# Patient Record
Sex: Female | Born: 1935
Health system: Southern US, Community
[De-identification: ages and names within clinical notes are randomized; demographics above are authoritative.]

## PROBLEM LIST (undated history)

## (undated) DIAGNOSIS — I6529 Occlusion and stenosis of unspecified carotid artery: Secondary | ICD-10-CM

## (undated) DIAGNOSIS — W19XXXA Unspecified fall, initial encounter: Secondary | ICD-10-CM

## (undated) DIAGNOSIS — F419 Anxiety disorder, unspecified: Secondary | ICD-10-CM

## (undated) DIAGNOSIS — M150 Primary generalized (osteo)arthritis: Secondary | ICD-10-CM

## (undated) DIAGNOSIS — E871 Hypo-osmolality and hyponatremia: Secondary | ICD-10-CM

## (undated) DIAGNOSIS — I10 Essential (primary) hypertension: Secondary | ICD-10-CM

## (undated) DIAGNOSIS — K219 Gastro-esophageal reflux disease without esophagitis: Secondary | ICD-10-CM

## (undated) DIAGNOSIS — R32 Unspecified urinary incontinence: Secondary | ICD-10-CM

## (undated) DIAGNOSIS — Z515 Encounter for palliative care: Secondary | ICD-10-CM

## (undated) DIAGNOSIS — N183 Chronic kidney disease, stage 3 unspecified: Secondary | ICD-10-CM

## (undated) DIAGNOSIS — M1990 Unspecified osteoarthritis, unspecified site: Secondary | ICD-10-CM

## (undated) DIAGNOSIS — Z8489 Family history of other specified conditions: Secondary | ICD-10-CM

## (undated) DIAGNOSIS — F0391 Unspecified dementia with behavioral disturbance: Secondary | ICD-10-CM

## (undated) DIAGNOSIS — Y92009 Unspecified place in unspecified non-institutional (private) residence as the place of occurrence of the external cause: Secondary | ICD-10-CM

## (undated) DIAGNOSIS — R413 Other amnesia: Principal | ICD-10-CM

## (undated) DIAGNOSIS — I779 Disorder of arteries and arterioles, unspecified: Secondary | ICD-10-CM

## (undated) DIAGNOSIS — K5790 Diverticulosis of intestine, part unspecified, without perforation or abscess without bleeding: Secondary | ICD-10-CM

## (undated) DIAGNOSIS — R0600 Dyspnea, unspecified: Secondary | ICD-10-CM

## (undated) DIAGNOSIS — I5032 Chronic diastolic (congestive) heart failure: Secondary | ICD-10-CM

## (undated) DIAGNOSIS — E559 Vitamin D deficiency, unspecified: Secondary | ICD-10-CM

## (undated) DIAGNOSIS — J449 Chronic obstructive pulmonary disease, unspecified: Secondary | ICD-10-CM

## (undated) DIAGNOSIS — I509 Heart failure, unspecified: Secondary | ICD-10-CM

## (undated) DIAGNOSIS — K648 Other hemorrhoids: Secondary | ICD-10-CM

## (undated) DIAGNOSIS — I959 Hypotension, unspecified: Secondary | ICD-10-CM

## (undated) DIAGNOSIS — E785 Hyperlipidemia, unspecified: Secondary | ICD-10-CM

## (undated) DIAGNOSIS — R112 Nausea with vomiting, unspecified: Secondary | ICD-10-CM

## (undated) DIAGNOSIS — I739 Peripheral vascular disease, unspecified: Secondary | ICD-10-CM

## (undated) DIAGNOSIS — R05 Cough: Secondary | ICD-10-CM

## (undated) HISTORY — DX: Other hemorrhoids: K64.8

## (undated) HISTORY — PX: CATARACT EXTRACTION: SUR2

## (undated) HISTORY — DX: Vitamin D deficiency, unspecified: E55.9

## (undated) HISTORY — DX: Other amnesia: R41.3

## (undated) HISTORY — PX: TONSILLECTOMY: SUR1361

## (undated) HISTORY — DX: Occlusion and stenosis of unspecified carotid artery: I65.29

## (undated) HISTORY — DX: Encounter for palliative care: Z51.5

## (undated) HISTORY — DX: Unspecified osteoarthritis, unspecified site: M19.90

## (undated) HISTORY — DX: Diverticulosis of intestine, part unspecified, without perforation or abscess without bleeding: K57.90

## (undated) HISTORY — PX: CAROTID ENDARTERECTOMY: SUR193

## (undated) HISTORY — DX: Cough: R05

## (undated) HISTORY — PX: BREAST REDUCTION SURGERY: SHX8

## (undated) HISTORY — DX: Hypo-osmolality and hyponatremia: E87.1

## (undated) HISTORY — DX: Heart failure, unspecified: I50.9

## (undated) HISTORY — DX: Chronic diastolic (congestive) heart failure: I50.32

## (undated) HISTORY — DX: Essential (primary) hypertension: I10

## (undated) HISTORY — DX: Peripheral vascular disease, unspecified: I73.9

## (undated) HISTORY — PX: APPENDECTOMY: SHX54

## (undated) HISTORY — PX: OTHER SURGICAL HISTORY: SHX169

## (undated) HISTORY — PX: EYE SURGERY: SHX253

## (undated) HISTORY — DX: Unspecified place in unspecified non-institutional (private) residence as the place of occurrence of the external cause: Y92.009

## (undated) HISTORY — DX: Disorder of arteries and arterioles, unspecified: I77.9

## (undated) HISTORY — DX: Unspecified dementia with behavioral disturbance: F03.91

## (undated) HISTORY — DX: Nausea with vomiting, unspecified: R11.2

## (undated) HISTORY — DX: Chronic kidney disease, stage 3 (moderate): N18.3

## (undated) HISTORY — DX: Hyperlipidemia, unspecified: E78.5

## (undated) HISTORY — DX: Unspecified fall, initial encounter: W19.XXXA

## (undated) HISTORY — DX: Gastro-esophageal reflux disease without esophagitis: K21.9

## (undated) HISTORY — DX: Primary generalized (osteo)arthritis: M15.0

## (undated) HISTORY — DX: Dyspnea, unspecified: R06.00

## (undated) HISTORY — DX: Chronic kidney disease, stage 3 unspecified: N18.30

## (undated) HISTORY — DX: Hypotension, unspecified: I95.9

## (undated) HISTORY — DX: Chronic obstructive pulmonary disease, unspecified: J44.9

## (undated) HISTORY — DX: Anxiety disorder, unspecified: F41.9

---

## 2000-09-07 ENCOUNTER — Encounter (HOSPITAL_BASED_OUTPATIENT_CLINIC_OR_DEPARTMENT_OTHER): Payer: Self-pay | Admitting: Internal Medicine

## 2000-09-07 ENCOUNTER — Encounter: Admission: RE | Admit: 2000-09-07 | Discharge: 2000-09-07 | Payer: Self-pay | Admitting: Internal Medicine

## 2002-01-21 ENCOUNTER — Encounter: Admission: RE | Admit: 2002-01-21 | Discharge: 2002-01-21 | Payer: Self-pay | Admitting: Internal Medicine

## 2002-01-21 ENCOUNTER — Encounter: Payer: Self-pay | Admitting: Internal Medicine

## 2002-10-11 ENCOUNTER — Ambulatory Visit (HOSPITAL_COMMUNITY): Admission: RE | Admit: 2002-10-11 | Discharge: 2002-10-11 | Payer: Self-pay | Admitting: Gastroenterology

## 2002-10-11 ENCOUNTER — Encounter (INDEPENDENT_AMBULATORY_CARE_PROVIDER_SITE_OTHER): Payer: Self-pay | Admitting: Specialist

## 2003-02-20 ENCOUNTER — Encounter: Admission: RE | Admit: 2003-02-20 | Discharge: 2003-02-20 | Payer: Self-pay | Admitting: Internal Medicine

## 2003-02-20 ENCOUNTER — Encounter: Payer: Self-pay | Admitting: Internal Medicine

## 2004-03-21 ENCOUNTER — Ambulatory Visit (HOSPITAL_COMMUNITY): Admission: RE | Admit: 2004-03-21 | Discharge: 2004-03-21 | Payer: Self-pay | Admitting: Internal Medicine

## 2007-04-09 ENCOUNTER — Encounter: Admission: RE | Admit: 2007-04-09 | Discharge: 2007-04-09 | Payer: Self-pay | Admitting: Internal Medicine

## 2007-05-18 ENCOUNTER — Ambulatory Visit: Payer: Self-pay | Admitting: Vascular Surgery

## 2008-01-20 ENCOUNTER — Ambulatory Visit (HOSPITAL_COMMUNITY): Admission: RE | Admit: 2008-01-20 | Discharge: 2008-01-20 | Payer: Self-pay | Admitting: Internal Medicine

## 2008-12-06 ENCOUNTER — Other Ambulatory Visit: Admission: RE | Admit: 2008-12-06 | Discharge: 2008-12-06 | Payer: Self-pay | Admitting: Obstetrics & Gynecology

## 2009-11-06 ENCOUNTER — Ambulatory Visit: Payer: Self-pay | Admitting: Vascular Surgery

## 2009-11-09 ENCOUNTER — Ambulatory Visit: Payer: Self-pay | Admitting: Vascular Surgery

## 2009-11-09 ENCOUNTER — Encounter: Payer: Self-pay | Admitting: Vascular Surgery

## 2009-11-09 ENCOUNTER — Inpatient Hospital Stay (HOSPITAL_COMMUNITY): Admission: RE | Admit: 2009-11-09 | Discharge: 2009-11-11 | Payer: Self-pay | Admitting: Vascular Surgery

## 2009-12-04 ENCOUNTER — Ambulatory Visit: Payer: Self-pay | Admitting: Vascular Surgery

## 2010-04-16 ENCOUNTER — Ambulatory Visit: Payer: Self-pay | Admitting: Vascular Surgery

## 2010-10-22 ENCOUNTER — Ambulatory Visit: Payer: Self-pay | Admitting: Vascular Surgery

## 2011-03-04 LAB — COMPREHENSIVE METABOLIC PANEL
BUN: 12 mg/dL (ref 6–23)
CO2: 28 mEq/L (ref 19–32)
Calcium: 9.1 mg/dL (ref 8.4–10.5)
Creatinine, Ser: 0.9 mg/dL (ref 0.4–1.2)
GFR calc non Af Amer: >60 mL/min (ref >60)
Glucose, Bld: 105 mg/dL — ABNORMAL HIGH (ref 70–99)
Sodium: 139 mEq/L (ref 135–145)
Total Protein: 7.1 g/dL (ref 6.0–8.3)

## 2011-03-04 LAB — CBC
Hemoglobin: 11.2 g/dL — ABNORMAL LOW (ref 12.0–15.0)
Hemoglobin: 14.9 g/dL (ref 12.0–15.0)
MCHC: 34.1 g/dL (ref 30.0–36.0)
MCHC: 34.2 g/dL (ref 30.0–36.0)
MCV: 87.5 fL (ref 78.0–100.0)
MCV: 89.1 fL (ref 78.0–100.0)
RBC: 3.68 MIL/uL — ABNORMAL LOW (ref 3.87–5.11)
RBC: 4.98 MIL/uL (ref 3.87–5.11)
RDW: 13.6 % (ref 11.5–15.5)
RDW: 13.6 % (ref 11.5–15.5)

## 2011-03-04 LAB — BASIC METABOLIC PANEL
CO2: 32 mEq/L (ref 19–32)
Chloride: 94 mEq/L — ABNORMAL LOW (ref 96–112)
Creatinine, Ser: 0.88 mg/dL (ref 0.4–1.2)
GFR calc Af Amer: >60 mL/min (ref >60)
Glucose, Bld: 130 mg/dL — ABNORMAL HIGH (ref 70–99)
Sodium: 130 mEq/L — ABNORMAL LOW (ref 135–145)

## 2011-03-04 LAB — URINALYSIS, ROUTINE W REFLEX MICROSCOPIC
Bilirubin Urine: NEGATIVE
Glucose, UA: NEGATIVE mg/dL
Hgb urine dipstick: NEGATIVE
Protein, ur: NEGATIVE mg/dL

## 2011-03-04 LAB — CROSSMATCH
ABO/RH(D): O POS
Antibody Screen: NEGATIVE

## 2011-03-04 LAB — APTT: aPTT: 29 seconds (ref 24–37)

## 2011-03-04 LAB — PROTIME-INR
INR: 1.04 (ref 0.00–1.49)
Prothrombin Time: 13.5 seconds (ref 11.6–15.2)

## 2011-04-15 NOTE — Procedures (Signed)
CAROTID DUPLEX EXAM   INDICATION:  Followup known carotid disease.   HISTORY:  Diabetes:  No.  Cardiac:  No.  Hypertension:  Yes.  Smoking:  Previous.  Previous Surgery:  On 11/09/2009 left carotid endarterectomy.  CV History:  No.  Amaurosis Fugax No, Paresthesias No, Hemiparesis No                                       RIGHT             LEFT  Brachial systolic pressure:         156               152  Brachial Doppler waveforms:         Normal            Normal  Vertebral direction of flow:        Antegrade         Bidirectional  DUPLEX VELOCITIES (cm/sec)  CCA peak systolic                   68                62  ECA peak systolic                   69                49  ICA peak systolic                   116               96  ICA end diastolic                   34                31  PLAQUE MORPHOLOGY:                  Calcific  PLAQUE AMOUNT:                      Mild  PLAQUE LOCATION:                    ICA / bulb.   IMPRESSION:  1. The right internal carotid artery suggests low end 40%-59%      stenosis.  2. Left internal carotid artery appears patent with no evidence of      restenosis.  3. Left vertebral artery suggests low-grade bidirectional flow.  4. Right vertebral artery suggests antegrade flow.   ___________________________________________  Quita Skye Hart [place], M.D.   NT/MEDQ  D:  10/22/2010  T:  10/22/2010  Job:  [id number]

## 2011-04-15 NOTE — Procedures (Signed)
CAROTID DUPLEX EXAM   INDICATION:  Carotid left bruit.   HISTORY:  Diabetes:  No.  Cardiac:  No.  Hypertension:  No.  Smoking:  Previous.  Previous Surgery:  No.  CV History:  Asymptomatic.  Amaurosis Fugax No, Paresthesias No, Hemiparesis No.                                       RIGHT             LEFT  Brachial systolic pressure:         100               115  Brachial Doppler waveforms:         Triphasic         Triphasic  Vertebral direction of flow:        Antegrade         To and fro  DUPLEX VELOCITIES (cm/sec)  CCA peak systolic                   50                50  ECA peak systolic                   108               119  ICA peak systolic                   182               407  ICA end diastolic                   47                189  PLAQUE MORPHOLOGY:                  Heterogenous      Heterogenous  PLAQUE AMOUNT:                      Moderate          Critical  PLAQUE LOCATION:                    ICA               ICA, bifurcation   IMPRESSION:  1. 40-59% stenosis noted on the right internal carotid artery.  2. 80-99% stenosis noted in the left internal carotid artery.  3. Left vertebral has a to and fro waveform, suggestive of possible      steal syndrome.   ___________________________________________  Quita Skye Hart [place], M.D.   CJ/MEDQ  D:  11/06/2009  T:  11/06/2009  Job:  [id number]

## 2011-04-15 NOTE — Consult Note (Signed)
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 This patient was noted to have  a differential in the blood pressure of her upper extremities by Dr.  Felipa Eth  with the right being much higher than the left  and she was  scheduled for CT angiography. I reviewed that and this reveals that she  does have some significant left subclavian occlusive disease at the  origin near the arch and also about 3 to 4 cm distally near the origin  of the vertebral artery on the left. She also has plaque formation at  the origin of her innominate artery on the right. She denies any  ischemic symptoms in either upper extremity including numbness,  paresthesias, fatigue, discoloration or throbbing of the arm. She also  denies any posterior circulation symptoms such as dizziness, diplopia,  blurred vision, syncope and has had no hemispheric or nonhemispheric  TIAs or history of stroke.   PAST MEDICAL HISTORY:  Positive for hyperlipidemia. Negative for  diabetes, hypertension, coronary artery disease, COPD.   PREVIOUS SURGERY:  Includes appendectomy for ruptured appendix 15 to 20  years ago.   FAMILY HISTORY:  Positive for diabetes in grandfather's family, positive  for coronary artery disease in the grandmother's family. Negative for  stroke.   SOCIAL HISTORY:  The patient is single, has 2 children and is retired.  She has smoked on and off for the past 50 years, roughly half a pack of  cigarettes per day, but has not smoked in 2 years.   MEDICATIONS:  Please see health history form.   ALLERGIES:  None known.   PHYSICAL EXAMINATION:  VITAL SIGNS: Blood pressure is 174/93 on the  right, 147/92 on the left and at  the end of the appointment ws rechecked  on the right and was 170/90. Heart rate is 81, respirations 18.  GENERAL: She is a female patient who is in no apparent distress. Alert  and oriented x3.  NECK: Her neck is supple, 3+ carotid pulses palpable. There is a soft  bruit in the left supra- and infraclavicular areas.  NEUROLOGIC: Exam is normal. There is no palpable adenopathy in the neck.  EXTREMITIES: The upper extremity pulses are 3+ bilaterally. Both hands  are well perfused.  CHEST: Clear to auscultation.  CARDIOVASCULAR: Exam reveals a regular rhythm with no murmurs.  ABDOMEN: Her abdomen is soft, nontender with no masses palpable. She has  3+ femoral, popliteal and posterior tibial pulses bilaterally.   I discussed the situation with her and her daughter at length and  explained to them that treatment is necessary since she is having no  ischemic symptoms in the arm and is having no symptoms consistent with  subclavian steal syndrome such as posterior circulation, ischemia.  Denies dizziness, diplopia, blurred vision or syncope. She is totally  asymptomatic. I would monitor her blood pressure in the right upper  extremity which I think is fairly accurate since the innominate stenosis  is very mild. Her blood pressure is elevated today and I have asked her  to  get in touch with Dr. Felipa Eth to begin antihypertensive treatments and I  will be happy to see her in the future as necessary.   Quita Skye Hart [place], M.D.  Electronically Signed  JDL/MEDQ  D:  05/18/2007  T:  05/19/2007  Job:  [id number]   cc:   Larina Earthly, M.D.

## 2011-04-15 NOTE — Assessment & Plan Note (Signed)
OFFICE VISIT   Virginia [name], Virginia [name] A  DOB:  22-Jul-1936                                       04/16/2010  [id number]#:[id number]   The patient returns for initial 59-month evaluation following a left  carotid endarterectomy which I performed December 2010 for an  asymptomatic severe left internal carotid stenosis.  She has known mild  to moderate right internal carotid occlusive disease.  She denies any  symptoms of hemiparesis, aphasia, amaurosis fugax, diplopia, blurred  vision, syncope or other neurologic symptoms.  She has had some  discomfort on the left side of her neck when she sleeps with the left  side down and that has been fairly constant, has not become more severe  and has been relieved by sleeping in the other positions.  The medicine  she takes for her shoulder arthritis seems to have helped that, she  states.  She has noticed no swelling in the left neck and has had no  drainage or infection.   CHRONIC MEDICAL PROBLEMS:  1. Hypertension.  2. Hyperlipidemia.  3. Negative coronary artery disease, diabetes, COPD or stroke.   SOCIAL HISTORY:  She is retired, single, has 2 children.  Does not use  tobacco or alcohol.   REVIEW OF SYSTEMS:  Negative chest pain, dyspnea on exertion, chronic  bronchitis, asthma, wheezing.  No GI or GU symptoms.  Has diffuse  arthritis and joint pain, particularly in the shoulder and the neck.  All other systems in the review of systems are negative.   PHYSICAL EXAMINATION:  Blood pressure 116/70, heart rate is 89,  temperature 98.  Generally, she is a well-developed, well-nourished  female who is in no apparent distress.  She is alert and oriented x3.  HEENT exam is normal.  EOMs intact.  Lungs are clear to auscultation.  No rhonchi or wheezing.  Cardiovascular exam:  Regular rhythm, no  murmurs.  Carotid pulses are 3 plus with no audible bruits.  Left neck  incision has healed nicely.  Abdomen is soft, nontender, with no  masses.  Neurologic exam is normal.  Musculoskeletal exam is free of major  deformities.   Today I ordered a carotid duplex exam, which I reviewed and interpreted.  The left carotid endarterectomy site is widely patent and she has some  mild right internal carotid stenosis in the 40%-50% range.   In general, I think she is doing well.  We will see her in 6 months to  follow her on a carotid protocol following left carotid endarterectomy  unless she develops any symptoms in the interim.  She will continue  taking 1 aspirin daily.     Quita Skye Hart [place], M.D.  Electronically Signed   JDL/MEDQ  D:  04/16/2010  T:  04/17/2010  Job:  [id number]   cc:   Larina Earthly, M.D.

## 2011-04-15 NOTE — H&P (Signed)
HISTORY AND PHYSICAL EXAMINATION   November 06, 2009   Re:  Virginia Virginia [name], Virginia Virginia [name]                 DOB:  Dec 09, 1935   CHIEF COMPLAINT:  Severe left internal carotid stenosis - asymptomatic.   HISTORY OF PRESENT ILLNESS:  This 75 year old female patient was  referred by Dr. Felipa [name] for [place] [name] left carotid bruit and possible carotid  occlusive disease.  The patient is known to have Virginia [name] left subclavian  occlusive disease having been evaluated by me in 2008 and at that time  the subclavian disease was asymptomatic and no treatment or surgery was  recommended.  She has denied any hemispheric or nonhemispheric TIAs,  amaurosis fugax, diplopia, blurred vision, syncope or left arm  claudication since that time and was now referred for an asymptomatic  left carotid bruit.   PAST MEDICAL HISTORY:  Chronic medical problems:  1. Previous hypertension but now does not have hypertension.  2. Hyperlipidemia.  3. Negative for diabetes, coronary artery disease, COPD or stroke.   PAST SURGICAL HISTORY:  1. Tonsillectomy.  2. Ruptured appendix with gangrene and peritonitis.  3. Breast reduction.   FAMILY HISTORY:  Positive for coronary artery disease in her father who  died of [place] [name] myocardial infarction age 64, negative for diabetes and  stroke.   SOCIAL HISTORY:  She is single, has 2 children.  Has not used tobacco in  4 years and never smoked more than half pack of cigarettes per day.  Does not use alcohol.   REVIEW OF SYSTEMS:  Has occasional chronic bronchitis, urinary  frequency, arthritis, joint pain, muscle pain, decreased hearing.  All  other systems in the 10 point review of systems were negative.   PHYSICAL EXAM:  Vital signs:  Blood pressure 121/70, heart rate 77,  respirations 14, temperature 97.9.  General: She is Virginia [name] well-developed,  well-nourished female in no apparent distress.  She is alert and  oriented x3.  HEENT:  Is unremarkable.  EOMs intact.  Neck:  Supple, 3+  carotid  pulses, high-pitched bruit over the left carotid bifurcation.  Neurologic:  Exam is normal.  Lungs:  Clear to auscultation.  No  wheezing.  Cardiovascular:  Regular rhythm.  No murmurs.  Abdomen:  Soft, nontender with no masses.  Musculoskeletal:  Exam reveals no major  deformities.  Skin:  Is free of rashes.   I ordered [place] [name] carotid duplex exam today and visualized and interpreted it.  It revealed that she has [place] [name] 90% left internal carotid stenosis and an  approximate 50% right internal carotid stenosis.  She also has left  subclavian occlusive disease with some sluggish flow in the left  vertebral artery.   ALLERGIES:  None known.   IMPRESSION:  Left carotid stenosis - asymptomatic.   PLAN:  To admit the patient on December 10 for an elective left carotid  endarterectomy.  The risks and benefits have been thoroughly discussed  with the patient.  She would like to proceed.   Virginia Virginia [name], M.D.  Electronically Signed   JDL/MEDQ  D:  11/06/2009  T:  11/07/2009  Job:  [id number]   cc:   Virginia Virginia [name], M.D.

## 2011-04-15 NOTE — Assessment & Plan Note (Signed)
OFFICE VISIT   NEOSHA, SWITALSKI A  DOB:  12-23-1935                                       12/04/2009  [id number]#:[id number]   The patient returns for initial followup regarding a left carotid  endarterectomy which I performed on December 12 for severe left internal  carotid stenosis which is asymptomatic.  She has done very well since  her surgery with no neurologic complications or specific complaints.  She has denied any dysphagia, hoarseness, hemiparesis, aphasia,  amaurosis fugax, diplopia, blurred vision or syncope.  She was beginning  to resume her normal activities.  She is taking 1 aspirin per day.   PHYSICAL EXAMINATION:  Blood pressure 140/78, heart rate 103,  respirations 14, temperature 97.8.  Left neck incision has healed  nicely.  Carotid pulses 3+ with no audible bruits.  Neurologic exam is  normal.   The patient apparently had an episode of pneumonia treated by Dr. Felipa Eth  with Levaquin which has resolved.   In general, I think she does have very good and I have encouraged her to  resume normal activity level.  We will plan to see her in 6 months with  follow-up carotid duplex exam unless she develops any neurologic  symptoms in the interim.     Quita Skye Hart [place], M.D.  Electronically Signed   JDL/MEDQ  D:  12/04/2009  T:  12/05/2009  Job:  [id number]   cc:   Larina Earthly, M.D.

## 2011-04-15 NOTE — Procedures (Signed)
CAROTID DUPLEX EXAM   INDICATION:  Follow up known carotid artery disease.   HISTORY:  Diabetes:  No.  Cardiac:  No.  Hypertension:  Yes.  Smoking:  Previous.  Previous Surgery:  11/09/09, left carotid endarterectomy.  CV History:  No.  Amaurosis Fugax No, Paresthesias No, Hemiparesis No.                                       RIGHT             LEFT  Brachial systolic pressure:         112               118  Brachial Doppler waveforms:         WNL               WNL  Vertebral direction of flow:        Antegrade         Not visualized  DUPLEX VELOCITIES (cm/sec)  CCA peak systolic                   98                89  ECA peak systolic                   116               104  ICA peak systolic                   135               146  ICA end diastolic                   36                43  PLAQUE MORPHOLOGY:                  Calcific  PLAQUE AMOUNT:                      Mild  PLAQUE LOCATION:                    ICA/bulb   IMPRESSION:  1. Right internal carotid artery suggests 40% to 59% stenosis.  2. Left internal carotid artery appears patent, status post carotid      endarterectomy with no evidence of restenosis.  3. Left vertebral not visualized.   ___________________________________________  Quita Skye Hart [place], M.D.   CB/MEDQ  D:  04/16/2010  T:  04/17/2010  Job:  [id number]

## 2011-10-16 ENCOUNTER — Other Ambulatory Visit (INDEPENDENT_AMBULATORY_CARE_PROVIDER_SITE_OTHER): Payer: Medicare Other | Admitting: *Deleted

## 2011-10-16 DIAGNOSIS — I6529 Occlusion and stenosis of unspecified carotid artery: Secondary | ICD-10-CM

## 2011-10-16 DIAGNOSIS — Z48812 Encounter for surgical aftercare following surgery on the circulatory system: Secondary | ICD-10-CM

## 2011-10-29 ENCOUNTER — Encounter: Payer: Self-pay | Admitting: Vascular Surgery

## 2011-10-29 NOTE — Procedures (Unsigned)
CAROTID DUPLEX EXAM  INDICATION:  Carotid endarterectomy.  HISTORY: Diabetes:  No. Cardiac:  No. Hypertension:  Yes. Smoking:  Previous. Previous Surgery:  Left carotid endarterectomy on 11/09/2009. CV History:  Currently asymptomatic. Amaurosis Fugax No, Paresthesias No, Hemiparesis No.                                      RIGHT             LEFT Brachial systolic pressure:         152               144 Brachial Doppler waveforms:         Normal            Normal Vertebral direction of flow:        Antegrade         Not visualized DUPLEX VELOCITIES (cm/sec) CCA peak systolic                   63                53 ECA peak systolic                   79                66 ICA peak systolic                   98                66 ICA end diastolic                   27                20 PLAQUE MORPHOLOGY:                  Heterogenous PLAQUE AMOUNT:                      Mild              None PLAQUE LOCATION:                    ICA/ECA  IMPRESSION: 1. Patent left carotid endarterectomy site with no hemodynamically     significant stenoses of the bilateral internal carotid arteries. 2. No significant change noted when compared with the previous     examination on 10/22/2010.  ___________________________________________ Quita Skye. Hart [place], M.D.  CH/MEDQ  D:  10/16/2011  T:  10/16/2011  Job:  [id number]

## 2011-11-03 ENCOUNTER — Other Ambulatory Visit: Payer: Self-pay

## 2011-11-03 DIAGNOSIS — Z48812 Encounter for surgical aftercare following surgery on the circulatory system: Secondary | ICD-10-CM

## 2011-11-03 DIAGNOSIS — I6529 Occlusion and stenosis of unspecified carotid artery: Secondary | ICD-10-CM

## 2012-03-03 DIAGNOSIS — R05 Cough: Secondary | ICD-10-CM | POA: Diagnosis not present

## 2012-03-03 DIAGNOSIS — I10 Essential (primary) hypertension: Secondary | ICD-10-CM | POA: Diagnosis not present

## 2012-03-03 DIAGNOSIS — J301 Allergic rhinitis due to pollen: Secondary | ICD-10-CM | POA: Diagnosis not present

## 2012-03-03 DIAGNOSIS — R062 Wheezing: Secondary | ICD-10-CM | POA: Diagnosis not present

## 2012-04-15 DIAGNOSIS — I10 Essential (primary) hypertension: Secondary | ICD-10-CM | POA: Diagnosis not present

## 2012-04-15 DIAGNOSIS — I739 Peripheral vascular disease, unspecified: Secondary | ICD-10-CM | POA: Diagnosis not present

## 2012-04-15 DIAGNOSIS — E785 Hyperlipidemia, unspecified: Secondary | ICD-10-CM | POA: Diagnosis not present

## 2012-04-15 DIAGNOSIS — J309 Allergic rhinitis, unspecified: Secondary | ICD-10-CM | POA: Diagnosis not present

## 2012-05-28 DIAGNOSIS — M546 Pain in thoracic spine: Secondary | ICD-10-CM | POA: Diagnosis not present

## 2012-05-28 DIAGNOSIS — T148XXA Other injury of unspecified body region, initial encounter: Secondary | ICD-10-CM | POA: Diagnosis not present

## 2012-05-29 DIAGNOSIS — W108XXA Fall (on) (from) other stairs and steps, initial encounter: Secondary | ICD-10-CM | POA: Diagnosis not present

## 2012-05-29 DIAGNOSIS — M549 Dorsalgia, unspecified: Secondary | ICD-10-CM | POA: Diagnosis not present

## 2012-05-29 DIAGNOSIS — S0003XA Contusion of scalp, initial encounter: Secondary | ICD-10-CM | POA: Diagnosis not present

## 2012-05-29 DIAGNOSIS — S0990XA Unspecified injury of head, initial encounter: Secondary | ICD-10-CM | POA: Diagnosis not present

## 2012-05-29 DIAGNOSIS — M542 Cervicalgia: Secondary | ICD-10-CM | POA: Diagnosis not present

## 2012-05-29 DIAGNOSIS — IMO0002 Reserved for concepts with insufficient information to code with codable children: Secondary | ICD-10-CM | POA: Diagnosis not present

## 2012-05-29 DIAGNOSIS — I6200 Nontraumatic subdural hemorrhage, unspecified: Secondary | ICD-10-CM | POA: Diagnosis not present

## 2012-05-29 DIAGNOSIS — S199XXA Unspecified injury of neck, initial encounter: Secondary | ICD-10-CM | POA: Diagnosis not present

## 2012-05-29 DIAGNOSIS — S1093XA Contusion of unspecified part of neck, initial encounter: Secondary | ICD-10-CM | POA: Diagnosis not present

## 2012-05-29 DIAGNOSIS — R404 Transient alteration of awareness: Secondary | ICD-10-CM | POA: Diagnosis not present

## 2012-05-29 DIAGNOSIS — R930 Abnormal findings on diagnostic imaging of skull and head, not elsewhere classified: Secondary | ICD-10-CM | POA: Diagnosis not present

## 2012-05-29 DIAGNOSIS — R51 Headache: Secondary | ICD-10-CM | POA: Diagnosis not present

## 2012-05-29 DIAGNOSIS — S0190XA Unspecified open wound of unspecified part of head, initial encounter: Secondary | ICD-10-CM | POA: Diagnosis not present

## 2012-05-29 DIAGNOSIS — M25569 Pain in unspecified knee: Secondary | ICD-10-CM | POA: Diagnosis not present

## 2012-05-29 DIAGNOSIS — S060X0A Concussion without loss of consciousness, initial encounter: Secondary | ICD-10-CM | POA: Diagnosis not present

## 2012-05-29 DIAGNOSIS — T1490XA Injury, unspecified, initial encounter: Secondary | ICD-10-CM | POA: Diagnosis not present

## 2012-05-29 DIAGNOSIS — S0100XA Unspecified open wound of scalp, initial encounter: Secondary | ICD-10-CM | POA: Diagnosis not present

## 2012-05-29 DIAGNOSIS — M546 Pain in thoracic spine: Secondary | ICD-10-CM | POA: Diagnosis not present

## 2012-05-30 DIAGNOSIS — R404 Transient alteration of awareness: Secondary | ICD-10-CM | POA: Diagnosis not present

## 2012-05-30 DIAGNOSIS — J984 Other disorders of lung: Secondary | ICD-10-CM | POA: Diagnosis not present

## 2012-05-30 DIAGNOSIS — R930 Abnormal findings on diagnostic imaging of skull and head, not elsewhere classified: Secondary | ICD-10-CM | POA: Diagnosis not present

## 2012-05-30 DIAGNOSIS — S0190XA Unspecified open wound of unspecified part of head, initial encounter: Secondary | ICD-10-CM | POA: Diagnosis not present

## 2012-05-31 DIAGNOSIS — R404 Transient alteration of awareness: Secondary | ICD-10-CM | POA: Diagnosis not present

## 2012-05-31 DIAGNOSIS — R930 Abnormal findings on diagnostic imaging of skull and head, not elsewhere classified: Secondary | ICD-10-CM | POA: Diagnosis not present

## 2012-05-31 DIAGNOSIS — S0190XA Unspecified open wound of unspecified part of head, initial encounter: Secondary | ICD-10-CM | POA: Diagnosis not present

## 2012-06-07 DIAGNOSIS — E785 Hyperlipidemia, unspecified: Secondary | ICD-10-CM | POA: Diagnosis not present

## 2012-06-07 DIAGNOSIS — I10 Essential (primary) hypertension: Secondary | ICD-10-CM | POA: Diagnosis not present

## 2012-06-29 DIAGNOSIS — T07XXXA Unspecified multiple injuries, initial encounter: Secondary | ICD-10-CM | POA: Diagnosis not present

## 2012-06-29 DIAGNOSIS — S0100XA Unspecified open wound of scalp, initial encounter: Secondary | ICD-10-CM | POA: Diagnosis not present

## 2012-06-29 DIAGNOSIS — M1990 Unspecified osteoarthritis, unspecified site: Secondary | ICD-10-CM | POA: Diagnosis not present

## 2012-06-29 DIAGNOSIS — Z4802 Encounter for removal of sutures: Secondary | ICD-10-CM | POA: Diagnosis not present

## 2012-07-16 DIAGNOSIS — S0100XA Unspecified open wound of scalp, initial encounter: Secondary | ICD-10-CM | POA: Diagnosis not present

## 2012-07-16 DIAGNOSIS — I10 Essential (primary) hypertension: Secondary | ICD-10-CM | POA: Diagnosis not present

## 2012-07-16 DIAGNOSIS — R350 Frequency of micturition: Secondary | ICD-10-CM | POA: Diagnosis not present

## 2012-07-26 DIAGNOSIS — D1801 Hemangioma of skin and subcutaneous tissue: Secondary | ICD-10-CM | POA: Diagnosis not present

## 2012-07-26 DIAGNOSIS — Z85828 Personal history of other malignant neoplasm of skin: Secondary | ICD-10-CM | POA: Diagnosis not present

## 2012-08-01 DIAGNOSIS — Y92009 Unspecified place in unspecified non-institutional (private) residence as the place of occurrence of the external cause: Secondary | ICD-10-CM

## 2012-08-01 DIAGNOSIS — W19XXXA Unspecified fall, initial encounter: Secondary | ICD-10-CM

## 2012-08-01 HISTORY — DX: Unspecified fall, initial encounter: W19.XXXA

## 2012-08-01 HISTORY — DX: Unspecified place in unspecified non-institutional (private) residence as the place of occurrence of the external cause: Y92.009

## 2012-10-08 ENCOUNTER — Encounter: Payer: Self-pay | Admitting: Vascular Surgery

## 2012-10-15 DIAGNOSIS — M899 Disorder of bone, unspecified: Secondary | ICD-10-CM | POA: Diagnosis not present

## 2012-10-15 DIAGNOSIS — M949 Disorder of cartilage, unspecified: Secondary | ICD-10-CM | POA: Diagnosis not present

## 2012-10-15 DIAGNOSIS — R82998 Other abnormal findings in urine: Secondary | ICD-10-CM | POA: Diagnosis not present

## 2012-10-15 DIAGNOSIS — E785 Hyperlipidemia, unspecified: Secondary | ICD-10-CM | POA: Diagnosis not present

## 2012-10-15 DIAGNOSIS — I10 Essential (primary) hypertension: Secondary | ICD-10-CM | POA: Diagnosis not present

## 2012-10-18 ENCOUNTER — Encounter: Payer: Self-pay | Admitting: Neurosurgery

## 2012-10-19 ENCOUNTER — Encounter: Payer: Self-pay | Admitting: Vascular Surgery

## 2012-10-19 ENCOUNTER — Ambulatory Visit (INDEPENDENT_AMBULATORY_CARE_PROVIDER_SITE_OTHER): Payer: Medicare Other | Admitting: Neurosurgery

## 2012-10-19 ENCOUNTER — Other Ambulatory Visit (INDEPENDENT_AMBULATORY_CARE_PROVIDER_SITE_OTHER): Payer: Medicare Other | Admitting: *Deleted

## 2012-10-19 ENCOUNTER — Encounter: Payer: Self-pay | Admitting: Neurosurgery

## 2012-10-19 VITALS — BP 147/83 | HR 64 | Resp 18 | Ht 64.5 in | Wt 145.0 lb

## 2012-10-19 DIAGNOSIS — I6529 Occlusion and stenosis of unspecified carotid artery: Secondary | ICD-10-CM

## 2012-10-19 DIAGNOSIS — Z48812 Encounter for surgical aftercare following surgery on the circulatory system: Secondary | ICD-10-CM

## 2012-10-19 NOTE — Progress Notes (Signed)
[facility] Carotid Office Note  CC: Carotid surveillance Referring Physician: Hart Rochester  History of Present Illness: 76 year old female patient of Dr. Hart Rochester status post left CEA in 2010. The patient denies any signs or symptoms of CVA, TIA, amaurosis fugax or any neural deficit. The patient denies any new medical diagnoses or recent surgery.  Past Medical History  Diagnosis Date  . Hyperlipidemia   . Hypertension   . Carotid artery occlusion     ROS: [x]  Positive   [ ]  Denies    General: [ ]  Weight loss, [ ]  Fever, [ ]  chills Neurologic: [ ]  Dizziness, [ ]  Blackouts, [ ]  Seizure [ ]  Stroke, [ ]  "Mini stroke", [ ]  Slurred speech, [ ]  Temporary blindness; [ ]  weakness in arms or legs, [ ]  Hoarseness Cardiac: [ ]  Chest pain/pressure, [ ]  Shortness of breath at rest [ ]  Shortness of breath with exertion, [ ]  Atrial fibrillation or irregular heartbeat Vascular: [ ]  Pain in legs with walking, [ ]  Pain in legs at rest, [ ]  Pain in legs at night,  [ ]  Non-healing ulcer, [ ]  Blood clot in vein/DVT,   Pulmonary: [ ]  Home oxygen, [ ]  Productive cough, [ ]  Coughing up blood, [ ]  Asthma,  [ ]  Wheezing Musculoskeletal:  [ ]  Arthritis, [ ]  Low back pain, [ ]  Joint pain Hematologic: [ ]  Easy Bruising, [ ]  Anemia; [ ]  Hepatitis Gastrointestinal: [ ]  Blood in stool, [ ]  Gastroesophageal Reflux/heartburn, [ ]  Trouble swallowing Urinary: [ ]  chronic Kidney disease, [ ]  on HD - [ ]  MWF or [ ]  TTHS, [ ]  Burning with urination, [ ]  Difficulty urinating Skin: [ ]  Rashes, [ ]  Wounds Psychological: [ ]  Anxiety, [ ]  Depression   Social History History  Substance Use Topics  . Smoking status: Not on file  . Smokeless tobacco: Not on file  . Alcohol Use: Not on file    Family History Family History  Problem Relation Age of Onset  . Heart disease Father     Allergies no known allergies  Current Outpatient Prescriptions  Medication Sig Dispense Refill  . aspirin 81 MG  tablet Take 81 mg by mouth daily.      . calcium-vitamin D (OSCAL WITH D) 500-200 MG-UNIT per tablet Take 1 tablet by mouth daily.      . colesevelam (WELCHOL) 625 MG tablet Take 1,875 mg by mouth 2 (two) times daily with a meal.      . Olmesartan Medoxomil-HCTZ (BENICAR HCT PO) Take 25 mg by mouth.      . Vitamin D, Ergocalciferol, (DRISDOL) 50000 UNITS CAPS Take 50,000 Units by mouth.        Physical Examination  There were no vitals filed for this visit.  There is no height or weight on file to calculate BMI.  General:  WDWN in NAD Gait: Normal HEENT: WNL Eyes: Pupils equal Pulmonary: normal non-labored breathing , without Rales, rhonchi,  wheezing Cardiac: RRR, without  Murmurs, rubs or gallops; Abdomen: soft, NT, no masses Skin: no rashes, ulcers noted  Vascular Exam Pulses: 3+ radial pulses bilaterally Carotid bruits: Carotid pulses to auscultation no bruits are heard Extremities without ischemic changes, no Gangrene , no cellulitis; no open wounds;  Musculoskeletal: no muscle wasting or atrophy   Neurologic: A&O X 3; Appropriate Affect ; SENSATION: normal; MOTOR FUNCTION:  moving all extremities equally. Speech is fluent/normal  Non-Invasive Vascular Imaging CAROTID DUPLEX 10/19/2012  Right ICA 20 - 39 %  stenosis Left ICA 0 - 19% stenosis   ASSESSMENT/PLAN: Asymptomatic patient doing well. The patient will followup in one year with repeat carotid duplex. The patient's questions were encouraged and answered, she is in agreement with this plan.  Lauree Chandler ANP   Clinic MD: Hart Rochester

## 2012-10-20 NOTE — Addendum Note (Signed)
Addended by: Sharee Pimple on: 10/20/2012 10:52 AM   Modules accepted: Orders

## 2012-10-21 DIAGNOSIS — Z0000 Encounter for general adult medical examination without abnormal findings: Secondary | ICD-10-CM | POA: Diagnosis not present

## 2012-10-21 DIAGNOSIS — Z23 Encounter for immunization: Secondary | ICD-10-CM | POA: Diagnosis not present

## 2012-10-21 DIAGNOSIS — R0989 Other specified symptoms and signs involving the circulatory and respiratory systems: Secondary | ICD-10-CM | POA: Diagnosis not present

## 2012-10-21 DIAGNOSIS — I872 Venous insufficiency (chronic) (peripheral): Secondary | ICD-10-CM | POA: Diagnosis not present

## 2012-10-21 DIAGNOSIS — I10 Essential (primary) hypertension: Secondary | ICD-10-CM | POA: Diagnosis not present

## 2013-06-07 DIAGNOSIS — R110 Nausea: Secondary | ICD-10-CM | POA: Diagnosis not present

## 2013-06-07 DIAGNOSIS — I10 Essential (primary) hypertension: Secondary | ICD-10-CM | POA: Diagnosis not present

## 2013-06-07 DIAGNOSIS — R51 Headache: Secondary | ICD-10-CM | POA: Diagnosis not present

## 2013-06-07 DIAGNOSIS — Z6827 Body mass index (BMI) 27.0-27.9, adult: Secondary | ICD-10-CM | POA: Diagnosis not present

## 2013-06-27 DIAGNOSIS — D485 Neoplasm of uncertain behavior of skin: Secondary | ICD-10-CM | POA: Diagnosis not present

## 2013-07-25 DIAGNOSIS — D1801 Hemangioma of skin and subcutaneous tissue: Secondary | ICD-10-CM | POA: Diagnosis not present

## 2013-07-25 DIAGNOSIS — C44529 Squamous cell carcinoma of skin of other part of trunk: Secondary | ICD-10-CM | POA: Diagnosis not present

## 2013-07-25 DIAGNOSIS — L821 Other seborrheic keratosis: Secondary | ICD-10-CM | POA: Diagnosis not present

## 2013-07-25 DIAGNOSIS — Z85828 Personal history of other malignant neoplasm of skin: Secondary | ICD-10-CM | POA: Diagnosis not present

## 2013-08-29 DIAGNOSIS — Z23 Encounter for immunization: Secondary | ICD-10-CM | POA: Diagnosis not present

## 2013-10-24 ENCOUNTER — Encounter: Payer: Self-pay | Admitting: Family

## 2013-10-25 ENCOUNTER — Other Ambulatory Visit: Payer: Medicare Other

## 2013-10-25 ENCOUNTER — Ambulatory Visit: Payer: Medicare Other | Admitting: Family

## 2013-10-25 ENCOUNTER — Ambulatory Visit: Payer: Medicare Other | Admitting: Neurosurgery

## 2013-10-25 ENCOUNTER — Ambulatory Visit (HOSPITAL_COMMUNITY)
Admission: RE | Admit: 2013-10-25 | Discharge: 2013-10-25 | Disposition: A | Discharge status: Home / Self Care | Payer: Medicare Other | Source: Ambulatory Visit | Attending: Family | Admitting: Family

## 2013-10-25 ENCOUNTER — Encounter (INDEPENDENT_AMBULATORY_CARE_PROVIDER_SITE_OTHER): Payer: Self-pay

## 2013-10-25 ENCOUNTER — Other Ambulatory Visit: Payer: Self-pay | Admitting: Vascular Surgery

## 2013-10-25 DIAGNOSIS — Z48812 Encounter for surgical aftercare following surgery on the circulatory system: Secondary | ICD-10-CM

## 2013-10-25 DIAGNOSIS — I6529 Occlusion and stenosis of unspecified carotid artery: Secondary | ICD-10-CM | POA: Diagnosis not present

## 2013-10-25 NOTE — Progress Notes (Signed)
Left before being seen.

## 2013-10-26 ENCOUNTER — Encounter: Payer: Self-pay | Admitting: Vascular Surgery

## 2013-10-31 DIAGNOSIS — M899 Disorder of bone, unspecified: Secondary | ICD-10-CM | POA: Diagnosis not present

## 2013-10-31 DIAGNOSIS — E785 Hyperlipidemia, unspecified: Secondary | ICD-10-CM | POA: Diagnosis not present

## 2013-10-31 DIAGNOSIS — I10 Essential (primary) hypertension: Secondary | ICD-10-CM | POA: Diagnosis not present

## 2013-11-07 DIAGNOSIS — S25109A Unspecified injury of unspecified innominate or subclavian artery, initial encounter: Secondary | ICD-10-CM | POA: Diagnosis not present

## 2013-11-07 DIAGNOSIS — Z0000 Encounter for general adult medical examination without abnormal findings: Secondary | ICD-10-CM | POA: Diagnosis not present

## 2013-11-07 DIAGNOSIS — I872 Venous insufficiency (chronic) (peripheral): Secondary | ICD-10-CM | POA: Diagnosis not present

## 2013-11-07 DIAGNOSIS — I10 Essential (primary) hypertension: Secondary | ICD-10-CM | POA: Diagnosis not present

## 2013-11-07 DIAGNOSIS — R0989 Other specified symptoms and signs involving the circulatory and respiratory systems: Secondary | ICD-10-CM | POA: Diagnosis not present

## 2013-11-07 DIAGNOSIS — M1990 Unspecified osteoarthritis, unspecified site: Secondary | ICD-10-CM | POA: Diagnosis not present

## 2013-11-07 DIAGNOSIS — Z1331 Encounter for screening for depression: Secondary | ICD-10-CM | POA: Diagnosis not present

## 2013-11-07 DIAGNOSIS — R0609 Other forms of dyspnea: Secondary | ICD-10-CM | POA: Diagnosis not present

## 2013-11-07 DIAGNOSIS — M899 Disorder of bone, unspecified: Secondary | ICD-10-CM | POA: Diagnosis not present

## 2013-11-09 ENCOUNTER — Other Ambulatory Visit: Payer: Self-pay | Admitting: Internal Medicine

## 2013-11-09 DIAGNOSIS — Z1231 Encounter for screening mammogram for malignant neoplasm of breast: Secondary | ICD-10-CM

## 2013-12-01 HISTORY — PX: COLONOSCOPY: SHX174

## 2013-12-19 ENCOUNTER — Ambulatory Visit
Admission: RE | Admit: 2013-12-19 | Discharge: 2013-12-19 | Disposition: A | Discharge status: Home / Self Care | Payer: Medicare Other | Source: Ambulatory Visit | Attending: Internal Medicine | Admitting: Internal Medicine

## 2013-12-19 DIAGNOSIS — Z1231 Encounter for screening mammogram for malignant neoplasm of breast: Secondary | ICD-10-CM

## 2014-05-04 DIAGNOSIS — H26499 Other secondary cataract, unspecified eye: Secondary | ICD-10-CM | POA: Diagnosis not present

## 2014-05-04 DIAGNOSIS — H52229 Regular astigmatism, unspecified eye: Secondary | ICD-10-CM | POA: Diagnosis not present

## 2014-05-04 DIAGNOSIS — Z961 Presence of intraocular lens: Secondary | ICD-10-CM | POA: Diagnosis not present

## 2014-05-04 DIAGNOSIS — H5200 Hypermetropia, unspecified eye: Secondary | ICD-10-CM | POA: Diagnosis not present

## 2014-05-29 DIAGNOSIS — I10 Essential (primary) hypertension: Secondary | ICD-10-CM | POA: Diagnosis not present

## 2014-05-29 DIAGNOSIS — R5381 Other malaise: Secondary | ICD-10-CM | POA: Diagnosis not present

## 2014-05-29 DIAGNOSIS — R5383 Other fatigue: Secondary | ICD-10-CM | POA: Diagnosis not present

## 2014-05-29 DIAGNOSIS — Z23 Encounter for immunization: Secondary | ICD-10-CM | POA: Diagnosis not present

## 2014-05-29 DIAGNOSIS — Z6827 Body mass index (BMI) 27.0-27.9, adult: Secondary | ICD-10-CM | POA: Diagnosis not present

## 2014-05-29 DIAGNOSIS — Z1331 Encounter for screening for depression: Secondary | ICD-10-CM | POA: Diagnosis not present

## 2014-05-29 DIAGNOSIS — G4700 Insomnia, unspecified: Secondary | ICD-10-CM | POA: Diagnosis not present

## 2014-06-20 DIAGNOSIS — H04129 Dry eye syndrome of unspecified lacrimal gland: Secondary | ICD-10-CM | POA: Diagnosis not present

## 2014-06-20 DIAGNOSIS — H43819 Vitreous degeneration, unspecified eye: Secondary | ICD-10-CM | POA: Diagnosis not present

## 2014-06-20 DIAGNOSIS — H3530 Unspecified macular degeneration: Secondary | ICD-10-CM | POA: Diagnosis not present

## 2014-06-20 DIAGNOSIS — H2640 Unspecified secondary cataract: Secondary | ICD-10-CM | POA: Diagnosis not present

## 2014-08-01 DIAGNOSIS — Z85828 Personal history of other malignant neoplasm of skin: Secondary | ICD-10-CM | POA: Diagnosis not present

## 2014-08-01 DIAGNOSIS — C44319 Basal cell carcinoma of skin of other parts of face: Secondary | ICD-10-CM | POA: Diagnosis not present

## 2014-08-01 DIAGNOSIS — D485 Neoplasm of uncertain behavior of skin: Secondary | ICD-10-CM | POA: Diagnosis not present

## 2014-08-01 DIAGNOSIS — IMO0002 Reserved for concepts with insufficient information to code with codable children: Secondary | ICD-10-CM | POA: Diagnosis not present

## 2014-08-16 DIAGNOSIS — C44319 Basal cell carcinoma of skin of other parts of face: Secondary | ICD-10-CM | POA: Diagnosis not present

## 2014-08-18 DIAGNOSIS — Z23 Encounter for immunization: Secondary | ICD-10-CM | POA: Diagnosis not present

## 2014-08-18 DIAGNOSIS — E785 Hyperlipidemia, unspecified: Secondary | ICD-10-CM | POA: Diagnosis not present

## 2014-08-18 DIAGNOSIS — R413 Other amnesia: Secondary | ICD-10-CM | POA: Diagnosis not present

## 2014-08-18 DIAGNOSIS — I10 Essential (primary) hypertension: Secondary | ICD-10-CM | POA: Diagnosis not present

## 2014-09-13 ENCOUNTER — Other Ambulatory Visit: Payer: Self-pay | Admitting: Internal Medicine

## 2014-09-13 DIAGNOSIS — I10 Essential (primary) hypertension: Secondary | ICD-10-CM | POA: Diagnosis not present

## 2014-09-13 DIAGNOSIS — Z6827 Body mass index (BMI) 27.0-27.9, adult: Secondary | ICD-10-CM | POA: Diagnosis not present

## 2014-09-13 DIAGNOSIS — R413 Other amnesia: Secondary | ICD-10-CM | POA: Diagnosis not present

## 2014-09-18 ENCOUNTER — Ambulatory Visit
Admission: RE | Admit: 2014-09-18 | Discharge: 2014-09-18 | Disposition: A | Discharge status: Home / Self Care | Payer: Medicare Other | Source: Ambulatory Visit | Attending: Internal Medicine | Admitting: Internal Medicine

## 2014-09-18 DIAGNOSIS — R413 Other amnesia: Secondary | ICD-10-CM

## 2014-09-18 DIAGNOSIS — C4490 Unspecified malignant neoplasm of skin, unspecified: Secondary | ICD-10-CM | POA: Diagnosis not present

## 2014-09-18 DIAGNOSIS — R410 Disorientation, unspecified: Secondary | ICD-10-CM | POA: Diagnosis not present

## 2014-09-18 DIAGNOSIS — Z9181 History of falling: Secondary | ICD-10-CM | POA: Diagnosis not present

## 2014-09-27 ENCOUNTER — Encounter: Payer: Self-pay | Admitting: Internal Medicine

## 2014-09-27 DIAGNOSIS — E559 Vitamin D deficiency, unspecified: Secondary | ICD-10-CM | POA: Diagnosis not present

## 2014-09-27 DIAGNOSIS — R5381 Other malaise: Secondary | ICD-10-CM | POA: Diagnosis not present

## 2014-09-27 DIAGNOSIS — R413 Other amnesia: Secondary | ICD-10-CM | POA: Diagnosis not present

## 2014-09-27 DIAGNOSIS — R7302 Impaired glucose tolerance (oral): Secondary | ICD-10-CM | POA: Diagnosis not present

## 2014-09-27 DIAGNOSIS — Z6827 Body mass index (BMI) 27.0-27.9, adult: Secondary | ICD-10-CM | POA: Diagnosis not present

## 2014-09-27 DIAGNOSIS — I10 Essential (primary) hypertension: Secondary | ICD-10-CM | POA: Diagnosis not present

## 2014-10-12 DIAGNOSIS — H2640 Unspecified secondary cataract: Secondary | ICD-10-CM | POA: Diagnosis not present

## 2014-10-12 DIAGNOSIS — H26491 Other secondary cataract, right eye: Secondary | ICD-10-CM | POA: Diagnosis not present

## 2014-10-24 ENCOUNTER — Ambulatory Visit: Payer: No Typology Code available for payment source | Admitting: Neurology

## 2014-10-24 ENCOUNTER — Ambulatory Visit (INDEPENDENT_AMBULATORY_CARE_PROVIDER_SITE_OTHER): Payer: Medicare Other | Admitting: Neurology

## 2014-10-24 ENCOUNTER — Encounter: Payer: Self-pay | Admitting: Neurology

## 2014-10-24 VITALS — BP 163/85 | HR 70

## 2014-10-24 DIAGNOSIS — E538 Deficiency of other specified B group vitamins: Secondary | ICD-10-CM

## 2014-10-24 DIAGNOSIS — R413 Other amnesia: Secondary | ICD-10-CM | POA: Diagnosis not present

## 2014-10-24 HISTORY — DX: Other amnesia: R41.3

## 2014-10-24 MED ORDER — DONEPEZIL HCL 10 MG PO TABS: 10.0000 mg | ORAL_TABLET | Freq: Every day | ORAL | Status: DC

## 2014-10-24 NOTE — Progress Notes (Signed)
Reason for visit: Memory disorder  Virginia [name] is a 78 y.o. female  History of present illness:  Virginia [name] is a 78 year old right-handed white female with a history of a progressive memory disorder. The patient suffered a concussion with loss of consciousness 2-1/2 years ago, and the family believes that her memory has not been completely normal since that time. Within the last year, her memory has been obviously changing and progressing. The patient has given up driving on her own as she feels insecure with driving. The patient lives alone at home, but her brother lives right next to her. She is requiring some assistance with her medications, as she does not take her medications on a regular basis, even with a pill dispenser. The patient otherwise manages her activities of daily living, she keeps up with appointments and finances. She does not have any excessive daytime drowsiness or fatigue. She denies any significant gait disorder or problems with numbness or weakness of the face, arms, or legs. She denies any headaches. She has been placed on Aricept taking 5 mg at night. She has been tolerating this medication fairly well. She is sent to this office for further evaluation. A CT scan of brain was done recently, showing mild small vessel disease.  Past Medical History  Diagnosis Date  . Hyperlipidemia   . Hypertension   . Carotid artery occlusion   . Fall at home Sept. 2013  . Memory disorder 10/24/2014    Past Surgical History  Procedure Laterality Date  . Carotid endarterectomy      left CEA  . Tonsillectomy    . Appendectomy    . Breast reduction surgery    . Skin cancer removal    . Cataract extraction Bilateral     Family History  Problem Relation Age of Onset  . Heart disease Father   . Cancer Mother     Brain  . Dementia Brother     Social history:  reports that she has never smoked. She has never used smokeless tobacco. She reports that she does not drink  alcohol or use illicit drugs.  Medications:  Current Outpatient Prescriptions on File Prior to Visit  Medication Sig Dispense Refill  . aspirin 81 MG tablet Take 81 mg by mouth daily.    . colesevelam (WELCHOL) 625 MG tablet Take 1,875 mg by mouth 2 (two) times daily with a meal.    . Vitamin D, Ergocalciferol, (DRISDOL) 50000 UNITS CAPS Take 50,000 Units by mouth.     No current facility-administered medications on file prior to visit.     No Known Allergies  ROS:  Out of a complete 14 system review of symptoms, the patient complains only of the following symptoms, and all other reviewed systems are negative.  Fatigue Hearing loss Shortness of breath, snoring Diarrhea Feeling hot, cold Joint pain, achy muscles Runny nose Memory loss, weakness Not enough sleep, disinterest in activities, snoring  Blood pressure 163/85, pulse 70.  Physical Exam  General: The patient is alert and cooperative at the time of the examination.  Eyes: Pupils are equal, round, and reactive to light. Discs are flat bilaterally.  Neck: The neck is supple, no carotid bruits are noted.  Respiratory: The respiratory examination is clear.  Cardiovascular: The cardiovascular examination reveals a regular rate and rhythm, no obvious murmurs or rubs are noted.  Skin: Extremities are without significant edema.  Neurologic Exam  Mental status: The Mini-Mental Status Examination done today shows a total  score of 26/30.  Animal fluency test score was 8.  Cranial nerves: Facial symmetry is present. There is good sensation of the face to pinprick and soft touch bilaterally. The strength of the facial muscles and the muscles to head turning and shoulder shrug are normal bilaterally. Speech is well enunciated, no aphasia or dysarthria is noted. Extraocular movements are full. Visual fields are full. The tongue is midline, and the patient has symmetric elevation of the soft palate. No obvious hearing deficits  are noted.  Motor: The motor testing reveals 5 over 5 strength of all 4 extremities. Good symmetric motor tone is noted throughout.  Sensory: Sensory testing is intact to pinprick, soft touch, vibration sensation, and position sense on all 4 extremities, with exception that there is a decrease in vibration sensation on the right foot. No evidence of extinction is noted.  Coordination: Cerebellar testing reveals good finger-nose-finger and heel-to-shin bilaterally.  Gait and station: Gait is normal. Tandem gait is normal. Romberg is negative. No drift is seen.  Reflexes: Deep tendon reflexes are symmetric and normal bilaterally. Toes are downgoing bilaterally.   CT head 09/18/14:  IMPRESSION: Small vessel disease type changes without CT evidence of large acute infarct.   Assessment/Plan:  1. Memory disorder  The patient is already on Aricept, we will increase the dose to 10 mg at night after 4 weeks of 5 mg therapy. The patient may be a candidate for Axona or Vayacog in the future. The patient appears to require some assistance with management of medications at this time. We will send the patient for speech therapy for cognitive therapy. She will follow-up in 6 months.  Jill Alexanders MD 10/24/2014 6:44 PM  [facility] [address] [place], [place] [address]  Phone [phone] Fax [phone]

## 2014-10-26 LAB — COPPER, SERUM: Copper: 148 ug/dL (ref 72–166)

## 2014-10-26 LAB — VITAMIN B12: Vitamin B-12: 684 pg/mL (ref 211–946)

## 2014-10-26 LAB — RPR: RPR: NONREACTIVE

## 2014-10-30 ENCOUNTER — Telehealth: Payer: Self-pay | Admitting: *Deleted

## 2014-10-30 ENCOUNTER — Telehealth: Payer: Self-pay | Admitting: Neurology

## 2014-10-30 ENCOUNTER — Encounter: Payer: Self-pay | Admitting: Family

## 2014-10-30 NOTE — Telephone Encounter (Signed)
Patient's daughter is calling because patient has been taking Aricept 10mg  at night and is causing patient to have sleep issues and also caused her blood pressure to rise. Should patient go back to taking 5mg  in the morning or what should patient do. Please calll and advise. Thank you.

## 2014-10-30 NOTE — Telephone Encounter (Signed)
I called the patient. The patient has had some difficulty with sleeping on Aricept 10 mg. They are split dose 5 mg in the morning and 5 in the evening. High blood pressures not listed as a potential side effect of the Aricept.

## 2014-10-30 NOTE — Telephone Encounter (Signed)
Noted  

## 2014-10-30 NOTE — Telephone Encounter (Signed)
Patient canceled New patient appointment she got in with another provider

## 2014-10-31 ENCOUNTER — Ambulatory Visit (INDEPENDENT_AMBULATORY_CARE_PROVIDER_SITE_OTHER): Payer: Medicare Other | Admitting: Family

## 2014-10-31 ENCOUNTER — Ambulatory Visit (HOSPITAL_COMMUNITY)
Admission: RE | Admit: 2014-10-31 | Discharge: 2014-10-31 | Disposition: A | Discharge status: Home / Self Care | Payer: Medicare Other | Source: Ambulatory Visit | Attending: Family | Admitting: Family

## 2014-10-31 ENCOUNTER — Encounter: Payer: Self-pay | Admitting: Family

## 2014-10-31 VITALS — BP 129/68 | HR 69 | Resp 16 | Ht 64.0 in | Wt 159.0 lb

## 2014-10-31 DIAGNOSIS — I6529 Occlusion and stenosis of unspecified carotid artery: Secondary | ICD-10-CM

## 2014-10-31 DIAGNOSIS — I2510 Atherosclerotic heart disease of native coronary artery without angina pectoris: Secondary | ICD-10-CM | POA: Diagnosis not present

## 2014-10-31 DIAGNOSIS — Z48812 Encounter for surgical aftercare following surgery on the circulatory system: Secondary | ICD-10-CM | POA: Diagnosis not present

## 2014-10-31 NOTE — Progress Notes (Signed)
Established Carotid Patient   History of Present Illness  Virginia [name] is a 78 y.o. female patient of Dr. Kellie Simmering who is status post left CEA in 2010. She returns today for follow up accompanied by her brother.  The patient denies any history of TIA or stroke symptoms, specifically the patient denies a history of amaurosis fugax or monocular blindness, denies a history unilateral  of facial drooping, denies a history of hemiplegia, and denies a history of receptive or expressive aphasia.   The pt denies any dizziness.  The pt sees Dr. Jannifer Franklin for memory loss; his notes include the result of a CT of the head: CT head 09/18/14: IMPRESSION: Small vessel disease type changes without CT evidence of large acute infarct.  The patient denies New Medical or Surgical History.  Pt Diabetic: No Pt smoker: former smoker, quit in 2005  Pt meds include: Statin : No ASA: Yes Other anticoagulants/antiplatelets: no   Past Medical History  Diagnosis Date  . Hyperlipidemia   . Hypertension   . Carotid artery occlusion   . Fall at home Sept. 2013  . Memory disorder 10/24/2014    Social History History  Substance Use Topics  . Smoking status: Never Smoker   . Smokeless tobacco: Never Used  . Alcohol Use: No    Family History Family History  Problem Relation Age of Onset  . Heart disease Father     Before age 20  . Hypertension Father   . Cancer Mother     Brain  . Dementia Brother     Surgical History Past Surgical History  Procedure Laterality Date  . Carotid endarterectomy      left CEA  . Tonsillectomy    . Appendectomy    . Breast reduction surgery    . Skin cancer removal    . Cataract extraction Bilateral   . Eye surgery      Allergies  Allergen Reactions  . Lidocaine Anaphylaxis, Swelling, Rash and Other (See Comments)    Any of the " [facility] "    Current Outpatient Prescriptions  Medication Sig Dispense Refill  . amLODipine (NORVASC) 2.5 MG tablet  Take 2.5 mg by mouth. Only if BP is high  0  . aspirin 81 MG tablet Take 81 mg by mouth daily.    Marland Kitchen b complex vitamins tablet Take 1 tablet by mouth daily.    . Cholecalciferol (VITAMIN D-3) 1000 UNITS CAPS Take 1,000 Units by mouth daily.    Marland Kitchen co-enzyme Q-10 50 MG capsule Take 50 mg by mouth daily.    . colesevelam (WELCHOL) 625 MG tablet Take 1,875 mg by mouth 2 (two) times daily with a meal.    . donepezil (ARICEPT) 10 MG tablet Take 1 tablet (10 mg total) by mouth at bedtime. (Patient taking differently: Take 5 mg by mouth 2 (two) times daily. ) 30 tablet 5  . Multiple Vitamins-Minerals (ALIVE WOMENS 50+ PO) Take by mouth daily.    . Multiple Vitamins-Minerals (PRESERVISION AREDS 2 PO) Take by mouth.    . Omega-3 Fatty Acids (FISH OIL PO) Take by mouth 2 (two) times daily.    . Vitamin D, Ergocalciferol, (DRISDOL) 50000 UNITS CAPS Take 50,000 Units by mouth.    . BYSTOLIC 10 MG tablet Take 10 mg by mouth daily.  0  . losartan (COZAAR) 25 MG tablet Take 25 mg by mouth daily.  0   No current facility-administered medications for this visit.    Review of  Systems : See HPI for pertinent positives and negatives.  Physical Examination  Filed Vitals:   10/31/14 1247 10/31/14 1251  BP: 122/63 129/68  Pulse: 70 69  Resp:  16  Height:  5\' 4"  (1.626 m)  Weight:  159 lb (72.122 kg)  SpO2:  95%   Body mass index is 27.28 kg/(m^2).  General: WDWN female in NAD GAIT: normal Eyes: PERRLA Pulmonary:  Non-labored, CTAB, Negative  Rales, Negative rhonchi, & Negative wheezing.  Cardiac: regular Rhythm,  Negative detected murmur.  VASCULAR EXAM: Carotid Bruits Right Left   Negative Negative    Aorta is not palpable. Radial pulses: right is 1+, left is 2+ palpable.                                                                                                                            LE Pulses Right Left       POPLITEAL  not palpable   not palpable       POSTERIOR TIBIAL  2+  palpable   2+ palpable        DORSALIS PEDIS      ANTERIOR TIBIAL 1+ palpable  2+ palpable     Gastrointestinal: soft, nontender, BS WNL, no r/g,  negative palpated masses.  Musculoskeletal: Negative muscle atrophy/wasting. M/S 5/5 throughout, Extremities without ischemic changes.  Neurologic: A&O X 2; Appropriate Affect ; SENSATION ;normal;  Speech is normal CN 2-12 intact, Pain and light touch intact in extremities, Motor exam as listed above.   Non-Invasive Vascular Imaging CAROTID DUPLEX 10/31/2014   CEREBROVASCULAR DUPLEX EVALUATION    INDICATION: Carotid artery disease    PREVIOUS INTERVENTION(S): Left carotid endarterectomy 11/09/2009    DUPLEX EXAM: Carotid duplex    RIGHT  LEFT  Peak Systolic Velocities (cm/s) End Diastolic Velocities (cm/s) Plaque LOCATION Peak Systolic Velocities (cm/s) End Diastolic Velocities (cm/s) Plaque  85 9 - CCA PROXIMAL 86 15 -  64 10 - CCA MID 68 13 -  59 10 HT CCA DISTAL 88 15 HT  114 8 - ECA 63 7 -  104 27 CP ICA PROXIMAL 97 11 HT  86 18 - ICA MID 91 14 -  80 19 - ICA DISTAL 83 18 -    1.6 ICA / CCA Ratio (PSV) N/A  Antegrade Vertebral Flow Bidirectional  741 Brachial Systolic Pressure (mmHg) 287  Triphasic Brachial Artery Waveforms Triphasic    Plaque Morphology:  HM = Homogeneous, HT = Heterogeneous, CP = Calcific Plaque, SP = Smooth Plaque, IP = Irregular Plaque  ADDITIONAL FINDINGS:     IMPRESSION: 1. Less than 40% bilateral internal carotid artery stenosis. 2. Abnormal left vertebral artery waveform suggestive of more proximal disease    Compared to the previous exam:  No significant change      Assessment: Virginia [name] is a 78 y.o. female who presents with asymptomatic minimal bilateral ICA stenosis. The  ICA stenosis is  Unchanged from previous exam.  Plan:  Follow-up in 1 year with Carotid Duplex.   I discussed in depth with the patient the nature of atherosclerosis, and emphasized the importance of maximal  medical management including strict control of blood pressure, blood glucose, and lipid levels, obtaining regular exercise, and continued cessation of smoking.  The patient is aware that without maximal medical management the underlying atherosclerotic disease process will progress, limiting the benefit of any interventions. The patient was given information about stroke prevention and what symptoms should prompt the patient to seek immediate medical care. Thank you for allowing [place] to participate in this patient's care.  Clemon Chambers, RN, MSN, FNP-C [facility] Office: [phone]  Clinic Physician: Kellie Simmering  10/31/2014 12:58 PM

## 2014-10-31 NOTE — Patient Instructions (Signed)
Stroke Prevention Some medical conditions and behaviors are associated with an increased chance of having a stroke. You may prevent a stroke by making healthy choices and managing medical conditions. HOW CAN I REDUCE MY RISK OF HAVING A STROKE?   Stay physically active. Get at least 30 minutes of activity on most or all days.  Do not smoke. It may also be helpful to avoid exposure to secondhand smoke.  Limit alcohol use. Moderate alcohol use is considered to be:  No more than 2 drinks per day for men.  No more than 1 drink per day for nonpregnant women.  Eat healthy foods. This involves:  Eating 5 or more servings of fruits and vegetables a day.  Making dietary changes that address high blood pressure (hypertension), high cholesterol, diabetes, or obesity.  Manage your cholesterol levels.  Making food choices that are high in fiber and low in saturated fat, trans fat, and cholesterol may control cholesterol levels.  Take any prescribed medicines to control cholesterol as directed by your health care provider.  Manage your diabetes.  Controlling your carbohydrate and sugar intake is recommended to manage diabetes.  Take any prescribed medicines to control diabetes as directed by your health care provider.  Control your hypertension.  Making food choices that are low in salt (sodium), saturated fat, trans fat, and cholesterol is recommended to manage hypertension.  Take any prescribed medicines to control hypertension as directed by your health care provider.  Maintain a healthy weight.  Reducing calorie intake and making food choices that are low in sodium, saturated fat, trans fat, and cholesterol are recommended to manage weight.  Stop drug abuse.  Avoid taking birth control pills.  Talk to your health care provider about the risks of taking birth control pills if you are over 35 years old, smoke, get migraines, or have ever had a blood clot.  Get evaluated for sleep  disorders (sleep apnea).  Talk to your health care provider about getting a sleep evaluation if you snore a lot or have excessive sleepiness.  Take medicines only as directed by your health care provider.  For some people, aspirin or blood thinners (anticoagulants) are helpful in reducing the risk of forming abnormal blood clots that can lead to stroke. If you have the irregular heart rhythm of atrial fibrillation, you should be on a blood thinner unless there is a good reason you cannot take them.  Understand all your medicine instructions.  Make sure that other conditions (such as anemia or atherosclerosis) are addressed. SEEK IMMEDIATE MEDICAL CARE IF:   You have sudden weakness or numbness of the face, arm, or leg, especially on one side of the body.  Your face or eyelid droops to one side.  You have sudden confusion.  You have trouble speaking (aphasia) or understanding.  You have sudden trouble seeing in one or both eyes.  You have sudden trouble walking.  You have dizziness.  You have a loss of balance or coordination.  You have a sudden, severe headache with no known cause.  You have new chest pain or an irregular heartbeat. Any of these symptoms may represent a serious problem that is an emergency. Do not wait to see if the symptoms will go away. Get medical help at once. Call your local emergency services (911 in [place] Do not drive yourself to the hospital. Document Released: 12/25/2004 Document Revised: 04/03/2014 Document Reviewed: 05/20/2013 ExitCare Patient Information 2015 [employer]. This information is not intended to replace advice given   to you by your health care provider. Make sure you discuss any questions you have with your health care provider.  

## 2014-10-31 NOTE — Addendum Note (Signed)
Addended by: Peter Minium K on: 10/31/2014 05:00 PM   Modules accepted: Orders

## 2014-11-02 ENCOUNTER — Ambulatory Visit: Payer: Medicare Other | Admitting: Rehabilitative and Restorative Service Providers"

## 2014-11-08 ENCOUNTER — Ambulatory Visit: Payer: Medicare Other | Admitting: Physical Therapy

## 2014-11-08 DIAGNOSIS — Z961 Presence of intraocular lens: Secondary | ICD-10-CM | POA: Diagnosis not present

## 2014-11-09 ENCOUNTER — Ambulatory Visit: Payer: Medicare Other | Attending: Internal Medicine | Admitting: Speech Pathology

## 2014-11-09 DIAGNOSIS — R41841 Cognitive communication deficit: Secondary | ICD-10-CM | POA: Insufficient documentation

## 2014-11-09 NOTE — Patient Instructions (Signed)
Pt to continue to do brain games, reading, solitaire, chess, checkers, board games, Crosswords,   Memory strategies

## 2014-11-09 NOTE — Therapy (Signed)
[facility] [address] [place], [place], [address] Phone: [phone]   Fax:  [phone]  Speech Language Pathology Evaluation  Patient Details  Name: Virginia [name] MRN: [id number] Date of Birth: 08/01/1936  Encounter Date: 11/09/2014      End of Session - 11/09/14 1146    Visit Number 1   Number of Visits 8   Date for SLP Re-Evaluation 11/09/14   SLP Start Time 56   SLP Stop Time  1152   SLP Time Calculation (min) 50 min   Activity Tolerance Patient tolerated treatment well      Past Medical History  Diagnosis Date  . Hyperlipidemia   . Hypertension   . Carotid artery occlusion   . Fall at home Sept. 2013  . Memory disorder 10/24/2014    Past Surgical History  Procedure Laterality Date  . Carotid endarterectomy      left CEA  . Tonsillectomy    . Appendectomy    . Breast reduction surgery    . Skin cancer removal    . Cataract extraction Bilateral   . Eye surgery      There were no vitals taken for this visit.  Visit Diagnosis: Cognitive communication deficit - Plan: SLP plan of care cert/re-cert      Subjective Assessment - 11/09/14 1116    Symptoms "She has no problem with judgement, just short term memory"   Currently in Pain? No/denies          SLP Evaluation [facility] - 11/09/14 1116    SLP Visit Information   SLP Received On 11/09/14   Onset Date 2 1/2 years ago   Medical Diagnosis progressive memory disorder s/p concussion 2 1/2 years ago   Subjective   Subjective "They just insist that they help me"   General Information   HPI progressive memory disorder, s/p concussion 2 1/2 years ago   Behavioral/Cognition walks independently   Mobility Status walks independently   Prior Functional Status   Cognitive/Linguistic Baseline Baseline deficits   Baseline deficit details memory disorder   Type of Home House    Lives With Other (Comment)  Brother   Available Support Family   Vocation  Retired   Associate Professor   Overall Cognitive Status Difficult to assess   Memory Impaired   Memory Impairment Decreased recall of new information;Decreased short term memory   Decreased Short Term Memory Verbal complex;Functional complex   Awareness Impaired   Awareness Impairment Intellectual impairment   Problem Solving Appears intact   Executive Function Organizing;Decision Making;Reasoning   Reasoning Impaired   Reasoning Impairment Verbal complex;Functional complex   Decision Making Impaired   Decision Making Impairment Verbal complex;Functional complex            SLP Education - 11/09/14 1144    Education provided Yes   Education Details goals, POC   Person(s) Educated Patient;Other (comment)   Methods Explanation   Comprehension Need further instruction            SLP Long Term Goals - 11/09/14 1206    SLP LONG TERM GOAL #1   Title Pt and caregiver will use external memory aids for daily tasks, schedule and mediciations with occasional minimal assistance over 2 sessions. (Due 12/10/13)   Time 4   Period Weeks   Status New   SLP LONG TERM GOAL #2   Title Pt and caregiver will report doing cogntive enhancing activities at home 5/7 days a week with occassional minimal assistance (Due  12/10/13)   Time 4   Period Weeks   Status New   SLP LONG TERM GOAL #3   Title Pt will verbalize intellectual awareness of memory impairment with occassional minimal assistance(Due 12/10/13)   Time 4   Period Weeks   Status New          Plan - 11/20/14 1152    Clinical Impression Statement 78 y.o. femalel with h/o progressive memory disorder referred by neurologist due to her daughters concerns of medication management and memory. Pt had concussion 2 1/2 years ago. she is accompanied by her borhter today. He reorts that Ms. Conkey has not attended her usual social outingsin the past few months. He also expressed concern that shehas not been reading like she used to.  Ms. Olberding dismisses  these concerns stating that her friends have not gotten together. Brother confided that she has had difficulty with bridge game and has not been going to this.  Ms. Siegenthaler denies any memory or attention difficulties. She states that her family has been helping with her meds, finances and sleeping at her house just recentlyand she does not know why. She demonsrates poor awareness of memory difficulties., A check writingf task revealed reduced attention to details, with Ms. Picchi forgetting to sign her name, and confusion entering checks and amounts into the register. She was oriented to the year, but required cues for month and date. I recommend skilled ST to maximize use of memory and attention strategies for pt independencde and to reduce caregiver burden.    Speech Therapy Frequency 2x / week   Duration 4 weeks   Potential to Achieve Goals Fair   Potential Considerations Ability to learn/carryover information   Consulted and Agree with Plan of Care Patient;Family member/caregiver   Family Member Consulted brother          G-Codes - 11/20/2014 1210/03/12    Functional Assessment Tool Used NOMS   Functional Limitations Memory   Memory Current Status [phone]) At least 20 percent but less than 40 percent impaired, limited or restricted   Memory Goal Status (W1191) At least 1 percent but less than 20 percent impaired, limited or restricted      Problem List Patient Active Problem List   Diagnosis Date Noted  . Memory disorder 10/24/2014  . Carotid stenosis 10/19/2012                          Lovvorn, Annye Rusk 2014/11/20, 12:14 PM Georgiann Hahn, [place], [place] 11-20-14 12:14 PM Phone: [phone] Fax: [phone]

## 2014-11-10 ENCOUNTER — Ambulatory Visit: Payer: Medicare Other | Admitting: Internal Medicine

## 2014-11-13 ENCOUNTER — Ambulatory Visit (INDEPENDENT_AMBULATORY_CARE_PROVIDER_SITE_OTHER): Payer: Medicare Other | Admitting: Internal Medicine

## 2014-11-13 ENCOUNTER — Encounter: Payer: Self-pay | Admitting: Internal Medicine

## 2014-11-13 ENCOUNTER — Ambulatory Visit: Payer: Medicare Other | Admitting: Neurology

## 2014-11-13 VITALS — BP 138/78 | HR 70 | Ht 64.0 in | Wt 157.1 lb

## 2014-11-13 DIAGNOSIS — I10 Essential (primary) hypertension: Secondary | ICD-10-CM | POA: Diagnosis not present

## 2014-11-13 DIAGNOSIS — I6529 Occlusion and stenosis of unspecified carotid artery: Secondary | ICD-10-CM | POA: Diagnosis not present

## 2014-11-13 DIAGNOSIS — E785 Hyperlipidemia, unspecified: Secondary | ICD-10-CM

## 2014-11-13 NOTE — Patient Instructions (Addendum)
Your physician wants you to follow-up in: Feb with Dr. Harrington Challenger  Your physician has requested that you have a lexiscan myoview. For further information please visit [email or web address]. Please follow instruction sheet, as given.  Will need to get you labs from Dr. Radene Gunning faxed [phone]

## 2014-11-13 NOTE — Progress Notes (Signed)
HPI Patinet is a 78 yo who has a history of PVOD (s/o L CEA), HTN and tob use.She comes in with er daughter today She denies CP  Does note SOB though with activity  Daughter aslo supports this .  Has to stop when doing things Denies PND  No dizziness or syncope.  No palpitations.    Allergies  Allergen Reactions  . Lidocaine Anaphylaxis, Swelling, Rash and Other (See Comments)    Any of the " [facility] "    Current Outpatient Prescriptions  Medication Sig Dispense Refill  . amLODipine (NORVASC) 2.5 MG tablet Take 2.5 mg by mouth. Only if BP is high  0  . aspirin 81 MG tablet Take 81 mg by mouth daily.    Marland Kitchen b complex vitamins tablet Take 1 tablet by mouth daily.    Marland Kitchen BYSTOLIC 10 MG tablet Take 10 mg by mouth daily.  0  . Cholecalciferol (VITAMIN D-3) 1000 UNITS CAPS Take 2,000 Units by mouth once a week.     . co-enzyme Q-10 50 MG capsule Take 100 mg by mouth daily.     . colesevelam (WELCHOL) 625 MG tablet Take 625 mg by mouth 6 (six) times daily.     Marland Kitchen donepezil (ARICEPT) 10 MG tablet Take 1 tablet (10 mg total) by mouth at bedtime. (Patient taking differently: Take 5 mg by mouth 2 (two) times daily. ) 30 tablet 5  . losartan (COZAAR) 25 MG tablet Take 25 mg by mouth daily.  0  . Multiple Vitamins-Minerals (ALIVE WOMENS 50+ PO) Take by mouth daily.    . Multiple Vitamins-Minerals (PRESERVISION AREDS 2 PO) Take by mouth.    . Omega-3 Fatty Acids (FISH OIL PO) Take by mouth 2 (two) times daily.    . Vitamin D, Ergocalciferol, (DRISDOL) 50000 UNITS CAPS Take 50,000 Units by mouth.     No current facility-administered medications for this visit.    Past Medical History  Diagnosis Date  . Hyperlipidemia   . Hypertension   . Carotid artery occlusion   . Fall at home Sept. 2013  . Memory disorder 10/24/2014    Past Surgical History  Procedure Laterality Date  . Carotid endarterectomy      left CEA  . Tonsillectomy    . Appendectomy    . Breast reduction surgery    . Skin  cancer removal    . Cataract extraction Bilateral   . Eye surgery      Family History  Problem Relation Age of Onset  . Heart disease Father     Before age 43  . Hypertension Father   . Cancer Mother     Brain  . Dementia Brother     History   Social History  . Marital Status: Divorced    Spouse Name: N/A    Number of Children: N/A  . Years of Education: N/A   Occupational History  . Not on file.   Social History Main Topics  . Smoking status: Never Smoker   . Smokeless tobacco: Never Used  . Alcohol Use: No  . Drug Use: No  . Sexual Activity: Not on file   Other Topics Concern  . Not on file   Social History Narrative    Review of Systems:  All systems reviewed.  They are negative to the above problem except as previously stated.  Vital Signs: BP 138/78 mmHg  Pulse 70  Ht 5\' 4"  (1.626 m)  Wt 157 lb 1.9 oz (71.269  kg)  BMI 26.96 kg/m2  Physical Exam  HEENT:  Normocephalic, atraumatic. EOMI, PERRLA.  Neck: JVP is normal.  No bruits.  Lungs: clear to auscultation. No rales no wheezes.  Heart: Regular rate and rhythm. Normal S1, S2. No S3.   No significant murmurs. PMI not displaced.  Abdomen:  Supple, nontender. Normal bowel sounds. No masses. No hepatomegaly.  Extremities:   Good distal pulses throughout. No lower extremity edema.  Musculoskeletal :moving all extremities.  Neuro:   alert and oriented x3.  CN II-XII grossly intact.  EKG  SR 70 bpm    Assessment and Plan:  1.  Dyspnea  Concerning  Will review labs from Dr Danna Hefty office.  Would also set up for [employer].  Pt does have evid of vasc dz  2.  PVOD  S/p L CEA  WIll need to check lpiids  She is not on a statin  WIll set f/u for Feb

## 2014-11-14 ENCOUNTER — Telehealth: Payer: Self-pay | Admitting: Internal Medicine

## 2014-11-14 NOTE — Telephone Encounter (Signed)
Returned phone call to [phone], left message to call back. Also called patient's mobile #, left message to call back.

## 2014-11-14 NOTE — Telephone Encounter (Signed)
New Msg     Pt daughter would like to know if she should take medications as normal.   Please contact at around noon [phone].

## 2014-11-15 ENCOUNTER — Ambulatory Visit (HOSPITAL_COMMUNITY): Payer: Medicare Other | Attending: Cardiology | Admitting: Radiology

## 2014-11-15 DIAGNOSIS — E785 Hyperlipidemia, unspecified: Secondary | ICD-10-CM | POA: Insufficient documentation

## 2014-11-15 DIAGNOSIS — I6523 Occlusion and stenosis of bilateral carotid arteries: Secondary | ICD-10-CM | POA: Insufficient documentation

## 2014-11-15 DIAGNOSIS — Z6826 Body mass index (BMI) 26.0-26.9, adult: Secondary | ICD-10-CM | POA: Diagnosis not present

## 2014-11-15 DIAGNOSIS — I10 Essential (primary) hypertension: Secondary | ICD-10-CM | POA: Diagnosis not present

## 2014-11-15 DIAGNOSIS — R5383 Other fatigue: Secondary | ICD-10-CM | POA: Insufficient documentation

## 2014-11-15 DIAGNOSIS — R0600 Dyspnea, unspecified: Secondary | ICD-10-CM | POA: Insufficient documentation

## 2014-11-15 MED ORDER — REGADENOSON 0.4 MG/5ML IV SOLN
0.4000 mg | Freq: Once | INTRAVENOUS | Status: AC
  Administered 2014-11-15: 0.4 mg via INTRAVENOUS

## 2014-11-15 MED ORDER — TECHNETIUM TC 99M SESTAMIBI GENERIC - CARDIOLITE
33.0000 | Freq: Once | INTRAVENOUS | Status: AC | PRN
  Administered 2014-11-15: 33 via INTRAVENOUS

## 2014-11-15 MED ORDER — TECHNETIUM TC 99M SESTAMIBI GENERIC - CARDIOLITE
11.0000 | Freq: Once | INTRAVENOUS | Status: AC | PRN
  Administered 2014-11-15: 11 via INTRAVENOUS

## 2014-11-15 NOTE — Progress Notes (Signed)
[place] [facility] NUCLEAR [facility] [address] [place], [place] [address] [phone]    Cardiology Nuclear Med Study  Virginia [name] is a 78 y.o. female     MRN : [id number]     DOB: [birth year]  Procedure Date: 11/15/2014  Nuclear Med Background Indication for Stress Test:  Evaluation for Ischemia History:  n/a Cardiac Risk Factors: Carotid Disease and Hypertension  Symptoms:  DOE and Fatigue   Nuclear Pre-Procedure Caffeine/Decaff Intake:  None> 12 hrs NPO After: 7:30am   Lungs:  clear O2 Sat: 97% on room air. IV 0.9% NS with Angio Cath:  22g  IV Site: R Hand x 1, tolerated well IV Started by:  Irven Baltimore, RN  Chest Size (in):  36 Cup Size: C  Height: 5\' 4"  (1.626 m)  Weight:  157 lb (71.215 kg)  BMI:  Body mass index is 26.94 kg/(m^2). Tech Comments:  No medications today. Irven Baltimore, RN.    Nuclear Med Study 1 or 2 day study: 1 day  Stress Test Type:  Lexiscan  Reading MD: N/A  Order Authorizing Provider:  Dorris Carnes, MD  Resting Radionuclide: Technetium 20m Sestamibi  Resting Radionuclide Dose: 11.0 mCi   Stress Radionuclide:  Technetium 34m Sestamibi  Stress Radionuclide Dose: 33.0 mCi           Stress Protocol Rest HR: 69 Stress HR: 88  Rest BP: 153/101 Stress BP: 144/81  Exercise Time (min): n/a METS: n/a   Predicted Max HR: 144 bpm % Max HR: 60.42 bpm Rate Pressure Product: 13311   Dose of Adenosine (mg):  n/a Dose of Lexiscan: 0.4 mg  Dose of Atropine (mg): n/a Dose of Dobutamine: n/a mcg/kg/min (at max HR)  Stress Test Technologist: Perrin Maltese, EMT-P  Nuclear Technologist:  Earl Many, CNMT     Rest Procedure:  Myocardial perfusion imaging was performed at rest 45 minutes following the intravenous administration of Technetium 38m Sestamibi. Rest ECG: NSR - Normal EKG  Stress Procedure:  The patient received IV Lexiscan 0.4 mg over 15-seconds.  Technetium 104m Sestamibi injected at 30-seconds. This patient had abdominal pain  and felt woozy with the Lexiscan injection. Quantitative spect images were obtained after a 45 minute delay. Stress ECG: No significant change from baseline ECG  QPS Raw Data Images:  Mild breast attenuation.  Normal left ventricular size. Stress Images:  Normal homogeneous uptake in all areas of the myocardium. Rest Images:  Normal homogeneous uptake in all areas of the myocardium. Subtraction (SDS):  No evidence of ischemia. Transient Ischemic Dilatation (Normal <1.22):  1.03 Lung/Heart Ratio (Normal <0.45):  0.26  Quantitative Gated Spect Images QGS EDV:  55 ml QGS ESV:  9 ml  Impression Exercise Capacity:  Lexiscan with no exercise. BP Response:  Normal blood pressure response. Clinical Symptoms:  No significant symptoms noted. ECG Impression:  No significant ECG changes with Lexiscan. Comparison with Prior Nuclear Study: No previous nuclear study performed  Overall Impression:  Low risk stress nuclear study with mild breast attenuation artifact..  LV Ejection Fraction: 83%.  LV Wall Motion:  NL LV Function; NL Wall Motion   [name], MD, [facility] [phone] office [phone] pager

## 2014-11-21 ENCOUNTER — Other Ambulatory Visit: Payer: Self-pay | Admitting: *Deleted

## 2014-11-21 DIAGNOSIS — R0602 Shortness of breath: Secondary | ICD-10-CM

## 2014-11-30 ENCOUNTER — Inpatient Hospital Stay (HOSPITAL_COMMUNITY)
Admission: AD | Admit: 2014-11-30 | Discharge: 2014-12-09 | DRG: 193 | Disposition: A | Discharge status: Skilled Nursing Facility | Payer: Medicare Other | Source: Ambulatory Visit | Attending: Internal Medicine | Admitting: Internal Medicine

## 2014-11-30 ENCOUNTER — Encounter (HOSPITAL_COMMUNITY): Payer: Self-pay | Admitting: *Deleted

## 2014-11-30 ENCOUNTER — Inpatient Hospital Stay (HOSPITAL_COMMUNITY): Payer: Medicare Other

## 2014-11-30 DIAGNOSIS — R531 Weakness: Secondary | ICD-10-CM | POA: Diagnosis not present

## 2014-11-30 DIAGNOSIS — R0602 Shortness of breath: Secondary | ICD-10-CM | POA: Diagnosis not present

## 2014-11-30 DIAGNOSIS — R112 Nausea with vomiting, unspecified: Secondary | ICD-10-CM

## 2014-11-30 DIAGNOSIS — R062 Wheezing: Secondary | ICD-10-CM | POA: Diagnosis not present

## 2014-11-30 DIAGNOSIS — I10 Essential (primary) hypertension: Secondary | ICD-10-CM | POA: Diagnosis not present

## 2014-11-30 DIAGNOSIS — R05 Cough: Secondary | ICD-10-CM | POA: Diagnosis not present

## 2014-11-30 DIAGNOSIS — J449 Chronic obstructive pulmonary disease, unspecified: Secondary | ICD-10-CM | POA: Diagnosis present

## 2014-11-30 DIAGNOSIS — J90 Pleural effusion, not elsewhere classified: Secondary | ICD-10-CM | POA: Diagnosis not present

## 2014-11-30 DIAGNOSIS — M8580 Other specified disorders of bone density and structure, unspecified site: Secondary | ICD-10-CM | POA: Diagnosis present

## 2014-11-30 DIAGNOSIS — R1311 Dysphagia, oral phase: Secondary | ICD-10-CM | POA: Diagnosis not present

## 2014-11-30 DIAGNOSIS — Z87891 Personal history of nicotine dependence: Secondary | ICD-10-CM

## 2014-11-30 DIAGNOSIS — Z8249 Family history of ischemic heart disease and other diseases of the circulatory system: Secondary | ICD-10-CM

## 2014-11-30 DIAGNOSIS — M6281 Muscle weakness (generalized): Secondary | ICD-10-CM | POA: Diagnosis not present

## 2014-11-30 DIAGNOSIS — R630 Anorexia: Secondary | ICD-10-CM | POA: Diagnosis present

## 2014-11-30 DIAGNOSIS — I509 Heart failure, unspecified: Secondary | ICD-10-CM | POA: Diagnosis not present

## 2014-11-30 DIAGNOSIS — J387 Other diseases of larynx: Secondary | ICD-10-CM | POA: Diagnosis present

## 2014-11-30 DIAGNOSIS — R918 Other nonspecific abnormal finding of lung field: Secondary | ICD-10-CM | POA: Diagnosis not present

## 2014-11-30 DIAGNOSIS — R0902 Hypoxemia: Secondary | ICD-10-CM | POA: Diagnosis not present

## 2014-11-30 DIAGNOSIS — Z8541 Personal history of malignant neoplasm of cervix uteri: Secondary | ICD-10-CM

## 2014-11-30 DIAGNOSIS — J159 Unspecified bacterial pneumonia: Secondary | ICD-10-CM

## 2014-11-30 DIAGNOSIS — J189 Pneumonia, unspecified organism: Principal | ICD-10-CM | POA: Diagnosis present

## 2014-11-30 DIAGNOSIS — E8770 Fluid overload, unspecified: Secondary | ICD-10-CM | POA: Diagnosis present

## 2014-11-30 DIAGNOSIS — J029 Acute pharyngitis, unspecified: Secondary | ICD-10-CM | POA: Diagnosis present

## 2014-11-30 DIAGNOSIS — Z7982 Long term (current) use of aspirin: Secondary | ICD-10-CM

## 2014-11-30 DIAGNOSIS — I5031 Acute diastolic (congestive) heart failure: Secondary | ICD-10-CM | POA: Diagnosis present

## 2014-11-30 DIAGNOSIS — R109 Unspecified abdominal pain: Secondary | ICD-10-CM | POA: Diagnosis not present

## 2014-11-30 DIAGNOSIS — E860 Dehydration: Secondary | ICD-10-CM | POA: Diagnosis present

## 2014-11-30 DIAGNOSIS — I771 Stricture of artery: Secondary | ICD-10-CM | POA: Diagnosis present

## 2014-11-30 DIAGNOSIS — I519 Heart disease, unspecified: Secondary | ICD-10-CM | POA: Diagnosis not present

## 2014-11-30 DIAGNOSIS — Z85828 Personal history of other malignant neoplasm of skin: Secondary | ICD-10-CM | POA: Diagnosis not present

## 2014-11-30 DIAGNOSIS — R140 Abdominal distension (gaseous): Secondary | ICD-10-CM

## 2014-11-30 DIAGNOSIS — K219 Gastro-esophageal reflux disease without esophagitis: Secondary | ICD-10-CM | POA: Diagnosis present

## 2014-11-30 DIAGNOSIS — E785 Hyperlipidemia, unspecified: Secondary | ICD-10-CM | POA: Diagnosis present

## 2014-11-30 DIAGNOSIS — R413 Other amnesia: Secondary | ICD-10-CM | POA: Diagnosis not present

## 2014-11-30 DIAGNOSIS — R059 Cough, unspecified: Secondary | ICD-10-CM

## 2014-11-30 DIAGNOSIS — R7302 Impaired glucose tolerance (oral): Secondary | ICD-10-CM | POA: Diagnosis not present

## 2014-11-30 DIAGNOSIS — Z9181 History of falling: Secondary | ICD-10-CM | POA: Diagnosis not present

## 2014-11-30 DIAGNOSIS — E871 Hypo-osmolality and hyponatremia: Secondary | ICD-10-CM | POA: Diagnosis present

## 2014-11-30 DIAGNOSIS — R0600 Dyspnea, unspecified: Secondary | ICD-10-CM | POA: Diagnosis not present

## 2014-11-30 DIAGNOSIS — R7309 Other abnormal glucose: Secondary | ICD-10-CM | POA: Diagnosis not present

## 2014-11-30 DIAGNOSIS — I5032 Chronic diastolic (congestive) heart failure: Secondary | ICD-10-CM

## 2014-11-30 DIAGNOSIS — E222 Syndrome of inappropriate secretion of antidiuretic hormone: Secondary | ICD-10-CM | POA: Diagnosis present

## 2014-11-30 DIAGNOSIS — R488 Other symbolic dysfunctions: Secondary | ICD-10-CM | POA: Diagnosis not present

## 2014-11-30 DIAGNOSIS — R609 Edema, unspecified: Secondary | ICD-10-CM | POA: Diagnosis not present

## 2014-11-30 DIAGNOSIS — R2681 Unsteadiness on feet: Secondary | ICD-10-CM | POA: Diagnosis not present

## 2014-11-30 DIAGNOSIS — Z6828 Body mass index (BMI) 28.0-28.9, adult: Secondary | ICD-10-CM | POA: Diagnosis not present

## 2014-11-30 HISTORY — DX: Nausea with vomiting, unspecified: R11.2

## 2014-11-30 LAB — BASIC METABOLIC PANEL
ANION GAP: 7 (ref 5–15)
BUN: 11 mg/dL (ref 6–23)
CO2: 29 mmol/L (ref 19–32)
Calcium: 8.4 mg/dL (ref 8.4–10.5)
Chloride: 89 mEq/L — ABNORMAL LOW (ref 96–112)
Creatinine, Ser: 0.5 mg/dL (ref 0.50–1.10)
GFR calc non Af Amer: >90 mL/min (ref >90)
Glucose, Bld: 137 mg/dL — ABNORMAL HIGH (ref 70–99)
Potassium: 4.8 mmol/L (ref 3.5–5.1)
SODIUM: 125 mmol/L — AB (ref 135–145)

## 2014-11-30 LAB — INFLUENZA PANEL BY PCR (TYPE A & B)
H1N1FLUPCR: NOT DETECTED
INFLAPCR: NEGATIVE
Influenza B By PCR: NEGATIVE

## 2014-11-30 LAB — COMPREHENSIVE METABOLIC PANEL
ALT: 33 U/L (ref 0–35)
AST: 46 U/L — ABNORMAL HIGH (ref 0–37)
Albumin: 3.1 g/dL — ABNORMAL LOW (ref 3.5–5.2)
Alkaline Phosphatase: 64 U/L (ref 39–117)
Anion gap: 9 (ref 5–15)
BUN: 11 mg/dL (ref 6–23)
CO2: 27 mmol/L (ref 19–32)
Calcium: 8.4 mg/dL (ref 8.4–10.5)
Chloride: 87 mEq/L — ABNORMAL LOW (ref 96–112)
Creatinine, Ser: 0.53 mg/dL (ref 0.50–1.10)
GFR calc Af Amer: >90 mL/min (ref >90)
GFR calc non Af Amer: 90 mL/min — ABNORMAL LOW (ref >90)
Glucose, Bld: 157 mg/dL — ABNORMAL HIGH (ref 70–99)
Potassium: 4.5 mmol/L (ref 3.5–5.1)
Sodium: 123 mmol/L — ABNORMAL LOW (ref 135–145)
TOTAL PROTEIN: 6.9 g/dL (ref 6.0–8.3)
Total Bilirubin: 1.5 mg/dL — ABNORMAL HIGH (ref 0.3–1.2)

## 2014-11-30 LAB — CBC
HCT: 40.7 % (ref 36.0–46.0)
Hemoglobin: 13.4 g/dL (ref 12.0–15.0)
MCH: 28.6 pg (ref 26.0–34.0)
MCHC: 32.9 g/dL (ref 30.0–36.0)
MCV: 87 fL (ref 78.0–100.0)
PLATELETS: 238 10*3/uL (ref 150–400)
RBC: 4.68 MIL/uL (ref 3.87–5.11)
RDW: 13.2 % (ref 11.5–15.5)
WBC: 9.2 10*3/uL (ref 4.0–10.5)

## 2014-11-30 MED ORDER — B COMPLEX-C PO TABS
1.0000 | ORAL_TABLET | Freq: Every day | ORAL | Status: DC
  Administered 2014-12-02 – 2014-12-09 (×6): 1 via ORAL
  Filled 2014-11-30 (×9): qty 1

## 2014-11-30 MED ORDER — SODIUM CHLORIDE 0.9 % IV SOLN
INTRAVENOUS | Status: DC
  Administered 2014-11-30 – 2014-12-01 (×2): via INTRAVENOUS
  Administered 2014-12-01: 1000 mL via INTRAVENOUS
  Administered 2014-12-01 – 2014-12-04 (×4): via INTRAVENOUS
  Administered 2014-12-05: 1000 mL via INTRAVENOUS

## 2014-11-30 MED ORDER — PRESERVISION AREDS 2 PO CAPS: ORAL_CAPSULE | Freq: Every morning | ORAL | Status: DC

## 2014-11-30 MED ORDER — ALIVE WOMENS 50+ PO TABS: ORAL_TABLET | Freq: Every day | ORAL | Status: DC

## 2014-11-30 MED ORDER — NEBIVOLOL HCL 10 MG PO TABS
10.0000 mg | ORAL_TABLET | Freq: Every day | ORAL | Status: DC
  Administered 2014-12-01 – 2014-12-09 (×9): 10 mg via ORAL
  Filled 2014-11-30 (×9): qty 1

## 2014-11-30 MED ORDER — LOSARTAN POTASSIUM 25 MG PO TABS
25.0000 mg | ORAL_TABLET | Freq: Every day | ORAL | Status: DC
  Administered 2014-12-01 – 2014-12-03 (×3): 25 mg via ORAL
  Filled 2014-11-30 (×4): qty 1

## 2014-11-30 MED ORDER — ASPIRIN EC 81 MG PO TBEC
81.0000 mg | DELAYED_RELEASE_TABLET | Freq: Every day | ORAL | Status: DC
  Administered 2014-12-01 – 2014-12-09 (×8): 81 mg via ORAL
  Filled 2014-11-30 (×9): qty 1

## 2014-11-30 MED ORDER — ADULT MULTIVITAMIN W/MINERALS CH
1.0000 | ORAL_TABLET | Freq: Every day | ORAL | Status: DC
  Administered 2014-12-02 – 2014-12-09 (×6): 1 via ORAL
  Filled 2014-11-30 (×10): qty 1

## 2014-11-30 MED ORDER — LEVOFLOXACIN IN D5W 750 MG/150ML IV SOLN
750.0000 mg | INTRAVENOUS | Status: DC
  Administered 2014-11-30 – 2014-12-03 (×4): 750 mg via INTRAVENOUS
  Filled 2014-11-30 (×5): qty 150

## 2014-11-30 MED ORDER — AMLODIPINE BESYLATE 2.5 MG PO TABS
2.5000 mg | ORAL_TABLET | Freq: Every day | ORAL | Status: DC
  Administered 2014-11-30 – 2014-12-03 (×4): 2.5 mg via ORAL
  Filled 2014-11-30 (×5): qty 1

## 2014-11-30 MED ORDER — VITAMIN D-3 25 MCG (1000 UT) PO CAPS: 2000.0000 [IU] | ORAL_CAPSULE | ORAL | Status: DC

## 2014-11-30 MED ORDER — COLESEVELAM HCL 625 MG PO TABS
1875.0000 mg | ORAL_TABLET | Freq: Two times a day (BID) | ORAL | Status: DC
  Administered 2014-12-01 – 2014-12-09 (×15): 1875 mg via ORAL
  Filled 2014-11-30 (×22): qty 3

## 2014-11-30 MED ORDER — ACETAMINOPHEN 325 MG PO TABS
650.0000 mg | ORAL_TABLET | Freq: Four times a day (QID) | ORAL | Status: DC | PRN
  Administered 2014-11-30: 650 mg via ORAL
  Filled 2014-11-30: qty 2

## 2014-11-30 MED ORDER — PROMETHAZINE HCL 25 MG/ML IJ SOLN
12.5000 mg | Freq: Four times a day (QID) | INTRAMUSCULAR | Status: DC | PRN
  Administered 2014-12-01: 6.25 mg via INTRAVENOUS
  Filled 2014-11-30: qty 1

## 2014-11-30 MED ORDER — VITAMIN D (ERGOCALCIFEROL) 1.25 MG (50000 UNIT) PO CAPS
50000.0000 [IU] | ORAL_CAPSULE | ORAL | Status: DC
  Administered 2014-12-01 – 2014-12-08 (×2): 50000 [IU] via ORAL
  Filled 2014-11-30 (×2): qty 1

## 2014-11-30 MED ORDER — DONEPEZIL HCL 5 MG PO TABS
5.0000 mg | ORAL_TABLET | Freq: Two times a day (BID) | ORAL | Status: DC
  Administered 2014-11-30 – 2014-12-09 (×16): 5 mg via ORAL
  Filled 2014-11-30 (×21): qty 1

## 2014-11-30 MED ORDER — ENOXAPARIN SODIUM 40 MG/0.4ML ~~LOC~~ SOLN
40.0000 mg | SUBCUTANEOUS | Status: DC
  Administered 2014-11-30 – 2014-12-08 (×8): 40 mg via SUBCUTANEOUS
  Filled 2014-11-30 (×10): qty 0.4

## 2014-11-30 MED ORDER — OMEGA-3-ACID ETHYL ESTERS 1 G PO CAPS
1.0000 g | ORAL_CAPSULE | Freq: Every day | ORAL | Status: DC
  Administered 2014-12-03 – 2014-12-09 (×5): 1 g via ORAL
  Filled 2014-11-30 (×10): qty 1

## 2014-11-30 MED ORDER — METHOCARBAMOL 500 MG PO TABS
500.0000 mg | ORAL_TABLET | Freq: Four times a day (QID) | ORAL | Status: DC | PRN
  Administered 2014-11-30: 500 mg via ORAL
  Filled 2014-11-30: qty 1

## 2014-11-30 NOTE — Progress Notes (Signed)
Patient ID: Virginia [name], female   DOB: 1936/03/22, 78 y.o.   MRN: [id number] Noted to have Na 123, will give fluids gently and watch carefully Suspect some SIADH due to nausea Gluc up at 157 CXR read as normal but I suspect she will "fluff out" an infiltrate with fluids  [place]

## 2014-11-30 NOTE — H&P (Signed)
PCP:   Tivis Ringer, MD   Chief Complaint:  Cough and nausea  HPI:  Vital Signs  Entered weight:  165  lbs., Calculated Weight: 165 lbs., ( 74.84 kg) Height: 63.5 in., ( 161.29 cm) Temperature: 98.2 deg F, Temperature site: oral Pulse rate: 71 Pulse rhythm: regular  Blood Pressure #1: 152 / 80 mm Hg    BMI: 28.77 BSA: 1.79 Wt Chg: 7 lbs since 09/27/2014  Vitals entered by: Cranford Mon on November 30, 2014 10:41 AM  Pulse Oximetry  O2 Saturation: 92 % Comments: RA           Risk Factors:   Smoked Tobacco Use:  Former smoker    Cigarettes:  Yes     Pack-years:  <1ppd       Year quit:  12/01/2005 Smokeless Tobacco Use:  Never Passive smoke exposure:  no Drug use:  no HIV high-risk behavior:  no Caffeine use:  1 drinks per day Alcohol use:  no Exercise:  yes    Times per week:  3    Type of Exercise:  walk Seatbelt use:  100 % Sun Exposure:  occasionally  Previous Tobacco Use: Signed On - 09/27/2014 Smoked Tobacco Use:  Former smoker    Cigarettes:  Yes     Pack-years:  <1ppd       Year quit:  12/01/2005 Smokeless Tobacco Use:  Never    Counseled to quit/cut down:  No Passive smoke exposure:  no Drug use:  no HIV high-risk behavior:  no Caffeine use:  1 drinks per day  Previous Alcohol Use: Signed On - 09/27/2014 Alcohol use:  no Exercise:  yes    Times per week:  3    Type of Exercise:  walk Seatbelt use:  100 % Sun Exposure:  occasionally  Colonoscopy History:    Date of Last Colonoscopy:  11/05/2009  Mammogram History:    Date of Last Mammogram:  12/19/2013  PAP Smear History:    Date of Last PAP Smear:  05/29/2014  History of Present Illness  History from: patient Reason for visit: See chief complaint Chief Complaint: Pt. complains of having a cold. X's 2 days. Slight fever.  SOB. No energy.  History of Present Illness: WORK IN started feeling bad last night low energy non prod cough some aches some anorexia no  f/c/s hasnt eaten anyting today no unusual exposures had some nausea, vomited here in XR and is quite weak   Review of Systems  General:       Complains of anorexia, fatigue, malaise.        Denies fevers, chills, headache, sweats, sleep disturbance.   Eyes:       Denies blurring, irritation, vision loss, photophobia.   Ears/Nose/Throat:       Denies sore throat, hoarseness, dysphagia.   Cardiovascular:       Complains of dyspnea on exertion.        Denies chest pains, palpitations, peripheral edema.   Respiratory:       Complains of cough, dyspnea, wheezing.   Gastrointestinal:       Denies nausea, vomiting, diarrhea, constipation, heartburn.   Genitourinary:       Denies hematuria, nocturia, urinary frequency.   Musculoskeletal:       Denies joint pain, stiffness, arthritis.        aches Skin:       Denies rash, itching, dryness.   Neurologic:       Denies paresthesias, tremors, vertigo, dizziness.  Psychiatric:       Denies depression, anxiety.   Endocrine:       Denies cold intolerance, heat intolerance.   Heme/Lymphatic:       Denies abnormal bruising, bleeding.   Allergic/Immunologic:       Denies urticaria, hay fever.    Past History Past Medical History (reviewed - no changes required): Zostavax 10/03/08 Hyperlipidemia HTN Bilateral occlusive disease CT Angio Ches (5./2008)t- narrowing of the L subclavian artery, coronary artery cacifications. Vascular surgery consult- Dr. Kellie Simmering (6/08);  Echo (8/06) Dr. Ladona Horns,  Recent Tobacco Abuse CXR (4/10)- Neg Osteopenia- DEXA (1/10) SAR Cervical Ca- 1963 GI: Medoff Colon OK 2000 recheck 10 yrs 12/10 done Gyn- Miller -PAP - neg (1/10) Basal Cell carcinoma- recurrent- forehead Insomnia R Corotid CEA 11/09/09 Significant fall down a flight of stairs without fracture, but complicated by some altered mental status in the hospital secondary to narcotic use.2013 Surgical History (reviewed - no changes required):  Appendectomy- 1985 Mohs Surgery- forehead (8/08)- Dr.Albertini Carotid endarterectomy on the left in December, 2010 by Dr. Kellie Simmering Cataract Surgery B 2012 [place], surgery? on the right eye by Dr. Kathrin Penner 7/15 Family History (reviewed - no changes required): Father- died at age 61. - MVA/HTN Mother - died at age 87- Brain Tumor, Hypothyroidism? Grandparents-DM2, CAD Aunt- Breast CA 3 siblings- 51F A&W, 26 M- colon polyps [facility], 46 M - ETOH/Alz Dementia/ Social History (reviewed - no changes required): Tobacco Abusue-2 ppd. Quit 01/07.  Divorced. 2 Children- 38 F-, 34 M- Asthma.  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.  Occasional alcohol use  Family History Summary:     Reviewed history Last on 09/27/2014 and no changes required:11/30/2014 Father Ailene Ravel.) - Has Family History of Heart Disease - Entered On: 06/07/2013 Father Ailene Ravel.) - Has Family History of Hypertension - Entered On: 06/07/2013 Mother Ailene Ravel.) - Has Family History of Thyroid Disorder - Entered On: 08/18/2014  General Comments - FH: Father- died at age 18. - MVA/HTN Mother - died at age 108- Brain Tumor, Hypothyroidism? Grandparents-DM2, CAD Aunt- Breast CA 3 siblings- 63F A&W, 85 M- colon polyps [facility], 70 M - ETOH/Alz Dementia/  Social History:    Reviewed history from 11/07/2013 and no changes required:       Tobacco Abusue-2 ppd. Quit 01/07.  Divorced. 2 Children- 37 F-, 22 M- Asthma.  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.  Occasional alcohol use   Physical Exam  General appearance: non toxic but a bit tired appearing  Eyes  External: anicteric no lid lag, no nystagmus  Ears, Nose and Throat  External ears: normal, no lesions or deformities External nose: normal, no lesions or deformities Otoscopic: canals clear, tympanic membranes intact, no fluid  Neck  Neck: supple, no masses, trachea midline, CEA on the left side Thyroid: no nodules, masses, tenderness, or enlargement  Respiratory  Respiratory effort: no intercostal retractions or use of  accessory muscles Auscultation: diffuse bilat rhonchi and wheeze  Cardiovascular  Auscultation: S1, S2, no murmur, rub, or gallop Periph. circulation: no cyanosis, clubbing, edema  Gastrointestinal  Abdomen: soft, non-tender, no masses, bowel sounds normal  Lymphatic  Neck: no cervical adenopathy  Musculoskeletal  Gait and station: slow and a bit unsteady Digits and nails: osteoarthritic changes  Skin  Inspection: no rashes, good turgor Speech: Normal Motor Strength: weak globally  Mental Status Exam  Judgment, insight: intact Orientation: oriented to time, place, and person Memory: a bit confused Mood and affect: Normal Mood Mentation: Alert & Oriented   Impression & Recommendations:  Problem # 1:  Pneumonia (  ICD-486) (ICD10-J18.9) at high risk for decompensation will send to the hospital for a observation stay will give IV Levaquin and hydrate  Problem # 2:  Nausea and vomiting (ICD-787.01) (ICD10-R11.2) check LFT and lytes  Problem # 3:  Memory loss (ICD-780.93) (ICD10-R41.3) close to her baseline  Problem # 4:  HYPERTENSION, BENIGN (ICD-401.1) (ICD10-I10) B/P: 152/80  up a little with the illness  Problem # 5:  HYPERLIPIDEMIA (ICD-272.4) (ICD10-E78.5) no new data, last checked a year ago Total 239 (10/31/2013 1:29:00 PM), Trig 134 (10/31/2013 1:29:00 PM) HDL 65 MG/DL (10/31/2013 1:29:00 PM) LDL 147 (10/31/2013 1:29:00 PM)      Problem # 6:  Impaired glucose tolerance (ICD-790.22) (ICD10-R73.02) check CBG today   ] ]  Electronically signed by Bonney Aid MD on 11/30/2014 at 11:27 AM ________________________________________________________________________      Past Medical History: Past Medical History  Diagnosis Date  . Hyperlipidemia   . Hypertension   . Carotid artery occlusion   . Fall at home Sept. 2013  . Memory disorder 10/24/2014   Past Surgical History  Procedure Laterality Date  . Carotid endarterectomy      left CEA   . Tonsillectomy    . Appendectomy    . Breast reduction surgery    . Skin cancer removal    . Cataract extraction Bilateral   . Eye surgery      Medications: Prior to Admission medications   Medication Sig Start Date End Date Taking? Authorizing Provider  amLODipine (NORVASC) 2.5 MG tablet Take 2.5 mg by mouth. Only if BP is high 10/17/14   Historical Provider, MD  aspirin 81 MG tablet Take 81 mg by mouth daily.    Historical Provider, MD  b complex vitamins tablet Take 1 tablet by mouth daily.    Historical Provider, MD  BYSTOLIC 10 MG tablet Take 10 mg by mouth daily. 10/11/14   Historical Provider, MD  Cholecalciferol (VITAMIN D-3) 1000 UNITS CAPS Take 2,000 Units by mouth once a week.     Historical Provider, MD  co-enzyme Q-10 50 MG capsule Take 100 mg by mouth daily.     Historical Provider, MD  colesevelam (WELCHOL) 625 MG tablet Take 625 mg by mouth 6 (six) times daily.     Historical Provider, MD  donepezil (ARICEPT) 10 MG tablet Take 1 tablet (10 mg total) by mouth at bedtime. Patient taking differently: Take 5 mg by mouth 2 (two) times daily.  10/24/14   Kathrynn Ducking, MD  losartan (COZAAR) 25 MG tablet Take 25 mg by mouth daily. 10/11/14   Historical Provider, MD  Multiple Vitamins-Minerals (ALIVE WOMENS 50+ PO) Take by mouth daily.    Historical Provider, MD  Multiple Vitamins-Minerals (PRESERVISION AREDS 2 PO) Take by mouth.    Historical Provider, MD  Omega-3 Fatty Acids (FISH OIL PO) Take by mouth 2 (two) times daily.    Historical Provider, MD  Vitamin D, Ergocalciferol, (DRISDOL) 50000 UNITS CAPS Take 50,000 Units by mouth.    Historical Provider, MD    Allergies:   Allergies  Allergen Reactions  . Lidocaine Anaphylaxis, Swelling, Rash and Other (See Comments)    Any of the " [place] "       Family History: Family History  Problem Relation Age of Onset  . Heart disease Father     Before age 5  . Hypertension Father   . Cancer Mother     Brain   . Dementia Brother  Radiological Exams on Admission: No results found. Orders placed or performed in visit on 11/27/14  . EKG 12-Lead    Assessment/Plan Principal Problem:   CAP (community acquired pneumonia) Active Problems:   Memory disorder   Nausea and vomiting   Community acquired bacterial pneumonia   Gilmer Kaminsky ALAN 11/30/2014, 12:09 PM

## 2014-12-01 ENCOUNTER — Inpatient Hospital Stay (HOSPITAL_COMMUNITY): Payer: Medicare Other

## 2014-12-01 LAB — BASIC METABOLIC PANEL
Anion gap: 9 (ref 5–15)
Anion gap: 9 (ref 5–15)
Anion gap: 6 (ref 5–15)
BUN: 9 mg/dL (ref 6–23)
BUN: 9 mg/dL (ref 6–23)
BUN: 10 mg/dL (ref 6–23)
CO2: 24 mmol/L (ref 19–32)
CO2: 26 mmol/L (ref 19–32)
CO2: 27 mmol/L (ref 19–32)
CREATININE: 0.5 mg/dL (ref 0.50–1.10)
Calcium: 7.9 mg/dL — ABNORMAL LOW (ref 8.4–10.5)
Calcium: 8 mg/dL — ABNORMAL LOW (ref 8.4–10.5)
Calcium: 8.1 mg/dL — ABNORMAL LOW (ref 8.4–10.5)
Chloride: 88 mEq/L — ABNORMAL LOW (ref 96–112)
Chloride: 89 mEq/L — ABNORMAL LOW (ref 96–112)
Chloride: 92 mEq/L — ABNORMAL LOW (ref 96–112)
Creatinine, Ser: 0.48 mg/dL — ABNORMAL LOW (ref 0.50–1.10)
Creatinine, Ser: 0.48 mg/dL — ABNORMAL LOW (ref 0.50–1.10)
GFR calc Af Amer: >90 mL/min (ref >90)
GFR calc Af Amer: >90 mL/min (ref >90)
GFR calc Af Amer: >90 mL/min (ref >90)
GFR calc non Af Amer: >90 mL/min (ref >90)
GLUCOSE: 196 mg/dL — AB (ref 70–99)
Glucose, Bld: 166 mg/dL — ABNORMAL HIGH (ref 70–99)
Glucose, Bld: 140 mg/dL — ABNORMAL HIGH (ref 70–99)
POTASSIUM: 4.3 mmol/L (ref 3.5–5.1)
Potassium: 3.7 mmol/L (ref 3.5–5.1)
Potassium: 4.4 mmol/L (ref 3.5–5.1)
SODIUM: 124 mmol/L — AB (ref 135–145)
Sodium: 121 mmol/L — ABNORMAL LOW (ref 135–145)
Sodium: 125 mmol/L — ABNORMAL LOW (ref 135–145)

## 2014-12-01 LAB — LIPID PANEL
Cholesterol: 195 mg/dL (ref 0–200)
HDL: 56 mg/dL (ref >39)
LDL Cholesterol: 122 mg/dL — ABNORMAL HIGH (ref 0–99)
TRIGLYCERIDES: 83 mg/dL (ref <150)
Total CHOL/HDL Ratio: 3.5 RATIO
VLDL: 17 mg/dL (ref 0–40)

## 2014-12-01 LAB — GLUCOSE, CAPILLARY
GLUCOSE-CAPILLARY: 126 mg/dL — AB (ref 70–99)
Glucose-Capillary: 137 mg/dL — ABNORMAL HIGH (ref 70–99)

## 2014-12-01 LAB — SODIUM, URINE, RANDOM: Sodium, Ur: 47 mmol/L

## 2014-12-01 MED ORDER — IPRATROPIUM-ALBUTEROL 0.5-2.5 (3) MG/3ML IN SOLN
3.0000 mL | Freq: Four times a day (QID) | RESPIRATORY_TRACT | Status: AC
  Administered 2014-12-01 – 2014-12-04 (×10): 3 mL via RESPIRATORY_TRACT
  Filled 2014-12-01 (×10): qty 3

## 2014-12-01 MED ORDER — INSULIN ASPART 100 UNIT/ML ~~LOC~~ SOLN
0.0000 [IU] | Freq: Three times a day (TID) | SUBCUTANEOUS | Status: DC
  Administered 2014-12-01: 2 [IU] via SUBCUTANEOUS
  Administered 2014-12-01: 1 [IU] via SUBCUTANEOUS
  Administered 2014-12-02: 2 [IU] via SUBCUTANEOUS
  Administered 2014-12-02 (×2): 1 [IU] via SUBCUTANEOUS
  Administered 2014-12-03: 2 [IU] via SUBCUTANEOUS
  Administered 2014-12-03 – 2014-12-06 (×3): 1 [IU] via SUBCUTANEOUS
  Administered 2014-12-07: 5 [IU] via SUBCUTANEOUS
  Administered 2014-12-08: 2 [IU] via SUBCUTANEOUS

## 2014-12-01 MED ORDER — METHYLPREDNISOLONE SODIUM SUCC 125 MG IJ SOLR
60.0000 mg | Freq: Two times a day (BID) | INTRAMUSCULAR | Status: DC
  Administered 2014-12-01 – 2014-12-03 (×6): 60 mg via INTRAVENOUS
  Filled 2014-12-01 (×8): qty 0.96

## 2014-12-01 MED ORDER — METOCLOPRAMIDE HCL 5 MG/ML IJ SOLN: 2.5000 mg | Freq: Four times a day (QID) | INTRAMUSCULAR | Status: DC | PRN

## 2014-12-01 MED ORDER — ONDANSETRON HCL 4 MG/2ML IJ SOLN: 2.0000 mg | Freq: Four times a day (QID) | INTRAMUSCULAR | Status: DC | PRN

## 2014-12-01 MED ORDER — OCUVITE-LUTEIN PO CAPS
1.0000 | ORAL_CAPSULE | Freq: Every day | ORAL | Status: DC
  Filled 2014-12-01 (×3): qty 1

## 2014-12-01 NOTE — Evaluation (Signed)
Physical Therapy Evaluation Patient Details Name: Virginia [name] MRN: [id number] DOB: 1936/09/21 Today's Date: 12/01/2014   History of Present Illness  79 y.o. female admitted with CAP.   Clinical Impression  *Pt admitted with above diagnosis. Pt currently with functional limitations due to the deficits listed below (see PT Problem List). ** Pt will benefit from skilled PT to increase their independence and safety with mobility to allow discharge to the venue listed below.    Min A to pivot to [facility]. SaO2 85% on RA, 95-97% on 2L O2, 3/4 dyspnea with activity. May need SNF if assist required upon DC as pt doesn't have 24* assist.  **    Follow Up Recommendations SNF    Equipment Recommendations  None recommended by PT    Recommendations for Other Services       Precautions / Restrictions Precautions Precautions: Fall Precaution Comments: monitor O2 Restrictions Weight Bearing Restrictions: No      Mobility  Bed Mobility Overal bed mobility: Needs Assistance Bed Mobility: Supine to Sit     Supine to sit: Min assist     General bed mobility comments: increased time, verbal/manual cues for technique,   Transfers Overall transfer level: Needs assistance Equipment used: Rolling walker (2 wheeled) Transfers: Sit to/from [facility] Sit to Stand: Min assist Stand pivot transfers: Min guard       General transfer comment: cues for hand placement, SPT bed to BSC, BSC to bed, then bed to recliner; fatigue/SOB limits activity tolerance, SaO2 85% on RA, 97% on 2L O2 [place]  Ambulation/Gait                Stairs            Wheelchair Mobility    Modified Rankin (Stroke Patients Only)       Balance Overall balance assessment: Needs assistance Sitting-balance support: Feet supported Sitting balance-Leahy Scale: Fair       Standing balance-Leahy Scale: Poor                               Pertinent Vitals/Pain      Home Living  Family/patient expects to be discharged to:: Private residence Living Arrangements: Other relatives Available Help at Discharge: Family;Available PRN/intermittently Type of Home: House Home Access: Stairs to enter Entrance Stairs-Rails: None Entrance Stairs-Number of Steps: 3 Home Layout: Two level;Able to live on main level with bedroom/bathroom Home Equipment: Gilford Rile - 2 wheels;Cane - single point;Shower seat Additional Comments: lives with brother    Prior Function Level of Independence: Independent               Hand Dominance        Extremity/Trunk Assessment   Upper Extremity Assessment: Overall WFL for tasks assessed           Lower Extremity Assessment: Overall WFL for tasks assessed      Cervical / Trunk Assessment: Normal  Communication   Communication: No difficulties  Cognition Arousal/Alertness: Awake/alert Behavior During Therapy: WFL for tasks assessed/performed Overall Cognitive Status: History of cognitive impairments - at baseline (h/o memory impairment)       Memory: Decreased short-term memory              General Comments      Exercises        Assessment/Plan    PT Assessment Patient needs continued PT services  PT Diagnosis Difficulty walking;Generalized weakness   PT Problem  List Decreased activity tolerance;Decreased balance;Decreased knowledge of use of DME;Decreased cognition;Decreased mobility;Cardiopulmonary status limiting activity  PT Treatment Interventions Gait training;DME instruction;Functional mobility training;Therapeutic activities;Patient/family education;Balance training;Therapeutic exercise   PT Goals (Current goals can be found in the Care Plan section) Acute Rehab PT Goals Patient Stated Goal: to get stronger PT Goal Formulation: With patient/family Time For Goal Achievement: 12/15/14 Potential to Achieve Goals: Good    Frequency Min 3X/week   Barriers to discharge Decreased caregiver  support brother and son check on on pt daily, no 24* assist available    Co-evaluation               End of Session Equipment Utilized During Treatment: Gait belt;Oxygen Activity Tolerance: Patient limited by fatigue Patient left: in chair;with call bell/phone within reach;with chair alarm set;with family/visitor present Nurse Communication: Mobility status         Time: 3532-9924 PT Time Calculation (min) (ACUTE ONLY): 30 min   Charges:   PT Evaluation $Initial PT Evaluation Tier I: 1 Procedure PT Treatments $Therapeutic Activity: 8-22 mins   PT G Codes:        Philomena Doheny 12/01/2014, 12:22 PM  [phone]

## 2014-12-01 NOTE — Progress Notes (Addendum)
Patient has been disoriented to place, time, situation for last hour. Does not know where she is or "why she is here". Thinks she is in the wrong room and keeps trying to get oob on own. Attempts to reorient unsuccessful. Asking same questions about why here, where she is at, "can't be the same room". Does not trust what staff is telling her. Called son who is returning to hospital to assist. Staff sitting in room with patient because  Patient will not lie down in bed. Patient with dementia and now with sundowning.

## 2014-12-01 NOTE — Plan of Care (Signed)
Problem: Phase I Progression Outcomes Goal: Tolerating diet Outcome: Not Met (add Reason) Patient with c/o nausea and " no appetite". Does not "feel like eating" . Few sips of water last night

## 2014-12-01 NOTE — Plan of Care (Addendum)
Problem: Phase I Progression Outcomes Goal: Hemodynamically stable Outcome: Not Met (add Reason) Patient oxygen sats 90-94% on room air during beginning of shift, but once resting and going to sleep oxygen level would drop into mid 80's on room air. Applied oxygen at 2l [place] with sats improving to mid 90's on 2l. Patient had to have oxygen reapplied several times during the night due to patient removing while she was sleeping. Patient also with wheezes bilaterally (day shift RN on 12/31 reported she had asked Dr Forde Dandy about nebs). +SOB with minimal exertion (reported been going on for weeks). Patient with dementia and sundowns in evening.

## 2014-12-01 NOTE — Progress Notes (Signed)
Virginia [name] is an 79 y.o. female.   PCP:   Tivis Ringer, MD   Subjective: Admitted from office 12/31 c CAP, Wheezing, and Hypoxia.  Low energy/Fatigue/Hypoxia/Non prod cough/body aches/anorexia/N/V Na 123 c SIADH She had some phenergan last night which caused her to be overly sedate. She is comfortable this am c no appetite  Objective: Vital signs in last 24 hours: Temp:  [97.4 F (36.3 C)-98.5 F (36.9 C)] 98.3 F (36.8 C) (01/01 0537) Pulse Rate:  [58-88] 88 (01/01 0537) Resp:  [16-18] 18 (01/01 0537) BP: (126-187)/(71-85) 126/73 mmHg (01/01 0537) SpO2:  [87 %-92 %] 87 % (01/01 0537) Weight:  [74.39 kg (164 lb)] 74.39 kg (164 lb) (12/31 1209) Weight change:  Last BM Date: 11/29/14  CBG (last 3)  No results for input(s): GLUCAP in the last 72 hours.  Intake/Output from previous day:  Intake/Output Summary (Last 24 hours) at 12/01/14 0853 Last data filed at 11/30/14 2152  Gross per 24 hour  Intake    120 ml  Output    100 ml  Net     20 ml   12/31 0701 - 01/01 0700 In: 120 [P.O.:120] Out: 100 [Urine:100]   Physical Exam  General appearance: alert and O but intermittently confused Eyes: no scleral icterus Throat: oropharynx moist without erythema Resp: Wheezing and rhonchi Cardio: Reg GI: Distended and bloated.  No pains.  RLQ Ab scar Extremities: no clubbing, cyanosis or edema   Lab Results:  Recent Labs  11/30/14 1815 12/01/14 0700  NA 125* 121*  K 4.8 3.7  CL 89* 88*  CO2 29 24  GLUCOSE 137* 166*  BUN 11 9  CREATININE 0.50 0.48*  CALCIUM 8.4 7.9*     Recent Labs  11/30/14 1350  AST 46*  ALT 33  ALKPHOS 64  BILITOT 1.5*  PROT 6.9  ALBUMIN 3.1*     Recent Labs  11/30/14 1350  WBC 9.2  HGB 13.4  HCT 40.7  MCV 87.0  PLT 238    Lab Results  Component Value Date   INR 1.04 11/09/2009    No results for input(s): CKTOTAL, CKMB, CKMBINDEX, TROPONINI in the last 72 hours.  No results for input(s): TSH, T4TOTAL, T3FREE,  THYROIDAB in the last 72 hours.  Invalid input(s): FREET3  No results for input(s): VITAMINB12, FOLATE, FERRITIN, TIBC, IRON, RETICCTPCT in the last 72 hours.  Micro Results: No results found for this or any previous visit (from the past 240 hour(s)).   Studies/Results: Dg Chest 2 View  11/30/2014   CLINICAL DATA:  Community acquired bacterial pneumonia.  EXAM: CHEST  2 VIEW  COMPARISON:  November 09, 2009.  FINDINGS: The heart size and mediastinal contours are within normal limits. Both lungs are clear. No pneumothorax or pleural effusion is noted. The visualized skeletal structures are unremarkable.  IMPRESSION: No acute cardiopulmonary abnormality seen.   Electronically Signed   By: Sabino Dick M.D.   On: 11/30/2014 14:53     Medications: Scheduled: . amLODipine  2.5 mg Oral Daily  . aspirin EC  81 mg Oral Daily  . B-complex with vitamin C  1 tablet Oral Daily  . colesevelam  1,875 mg Oral BID WC  . donepezil  5 mg Oral BID  . enoxaparin (LOVENOX) injection  40 mg Subcutaneous Q24H  . levofloxacin (LEVAQUIN) IV  750 mg Intravenous Q24H  . losartan  25 mg Oral Daily  . multivitamin with minerals  1 tablet Oral Daily  . nebivolol  10 mg Oral Daily  . omega-3 acid ethyl esters  1 g Oral Daily  . PRESERVISION AREDS 2   Oral q morning - 10a  . Vitamin D (Ergocalciferol)  50,000 Units Oral Q7 days   Continuous: . sodium chloride 100 mL/hr at 11/30/14 1416     Assessment/Plan: Principal Problem:   CAP (community acquired pneumonia) Active Problems:   Memory disorder   Nausea and vomiting   Community acquired bacterial pneumonia   Pneumonia? c Hypoxia - On IV Levaquin, O2, and hydration.  Add Nebs and Solumedrol.  Former smoker - Quit 2007.  WBC normal.  No Fever since admit Influenza (-) CXR yesterday = No acute cardiopulmonary abnormality seen  Nausea and vomiting - AST 46.  Check Ab [place] For the bloating and distension - check KUB - ? Ileus vrs psbo??  Memory loss -  baseline.  On Aricept.  Made worse c low Na meds and hospital stay. Son in room helping  Hyponatremia - SIADH.  Serum Osm and U Na ordered.  1.7L free water Restrict per day NS Infusion  Nas bounced b/w 123 - 125 - 121.  Follow Na.  HYPERTENSION, BENIGN.  On Cozaar, norvasc and Bystolic at home.  No HCT or diuretics.  HYPERLIPIDEMIA - Lipids ordered.  On Welchol and Omega 3 as outpt. ast checked 10/31/13 Total 239 (10/31/2013 1:29:00 PM), Trig 134 (10/31/2013 1:29:00 PM) HDL 65 MG/DL (10/31/2013 1:29:00 PM) LDL 147 (10/31/2013 1:29:00 PM)  Impaired glucose tolerance - Check A1C.  CBGs 137-157  Vasculopathy c R Carotid CEA 2010, L Subclavian Stenosis, and coronary calcifications.  Gen Weakness - PT ordered.  Will check c Dr Dagmar Hait as to wether he wants this pt to stay on his service or transition to Hospitalist service. Spoke to Hospitalist service and if moved needs to call flow manager @ [phone]  ID -  Anti-infectives    Start     Dose/Rate Route Frequency Ordered Stop   11/30/14 1300  levofloxacin (LEVAQUIN) IVPB 750 mg     750 mg100 mL/hr over 90 Minutes Intravenous Every 24 hours 11/30/14 1208       DVT Prophylaxis    LOS: 1 day   Noe Pittsley M 12/01/2014, 8:53 AM

## 2014-12-01 NOTE — Progress Notes (Signed)
Received a phone call from radiology.  [place] ABD will be done in morning 12/02/14.  Pt to be NPO after MN.

## 2014-12-01 NOTE — Plan of Care (Signed)
Problem: Phase I Progression Outcomes Goal: O2 sats > or equal 90% or at baseline Outcome: Not Met (add Reason) Oxygen applied at 2l [place] due to sats dropping to mid 80's

## 2014-12-01 NOTE — Progress Notes (Signed)
Patient talking in her sleep, restless in bed but asleep. Had phenergan a few hours ago. Bed alarm on . Will continue to monitor

## 2014-12-02 ENCOUNTER — Inpatient Hospital Stay (HOSPITAL_COMMUNITY): Payer: Medicare Other

## 2014-12-02 DIAGNOSIS — E871 Hypo-osmolality and hyponatremia: Secondary | ICD-10-CM

## 2014-12-02 DIAGNOSIS — I10 Essential (primary) hypertension: Secondary | ICD-10-CM | POA: Diagnosis present

## 2014-12-02 HISTORY — DX: Hypo-osmolality and hyponatremia: E87.1

## 2014-12-02 HISTORY — DX: Essential (primary) hypertension: I10

## 2014-12-02 LAB — GLUCOSE, CAPILLARY
GLUCOSE-CAPILLARY: 124 mg/dL — AB (ref 70–99)
GLUCOSE-CAPILLARY: 152 mg/dL — AB (ref 70–99)
GLUCOSE-CAPILLARY: 131 mg/dL — AB (ref 70–99)

## 2014-12-02 LAB — BASIC METABOLIC PANEL
ANION GAP: 9 (ref 5–15)
BUN: 12 mg/dL (ref 6–23)
CHLORIDE: 95 meq/L — AB (ref 96–112)
CO2: 26 mmol/L (ref 19–32)
Calcium: 8.4 mg/dL (ref 8.4–10.5)
Creatinine, Ser: 0.67 mg/dL (ref 0.50–1.10)
GFR calc non Af Amer: 83 mL/min — ABNORMAL LOW (ref >90)
Glucose, Bld: 147 mg/dL — ABNORMAL HIGH (ref 70–99)
Potassium: 4.8 mmol/L (ref 3.5–5.1)
Sodium: 130 mmol/L — ABNORMAL LOW (ref 135–145)

## 2014-12-02 LAB — CBC
HCT: 38.2 % (ref 36.0–46.0)
Hemoglobin: 12.6 g/dL (ref 12.0–15.0)
MCH: 28.5 pg (ref 26.0–34.0)
MCHC: 33 g/dL (ref 30.0–36.0)
MCV: 86.4 fL (ref 78.0–100.0)
PLATELETS: 256 10*3/uL (ref 150–400)
RBC: 4.42 MIL/uL (ref 3.87–5.11)
RDW: 13 % (ref 11.5–15.5)
WBC: 12 10*3/uL — ABNORMAL HIGH (ref 4.0–10.5)

## 2014-12-02 LAB — HEPATIC FUNCTION PANEL
ALT: 65 U/L — ABNORMAL HIGH (ref 0–35)
AST: 41 U/L — ABNORMAL HIGH (ref 0–37)
Albumin: 2.8 g/dL — ABNORMAL LOW (ref 3.5–5.2)
Alkaline Phosphatase: 86 U/L (ref 39–117)
Bilirubin, Direct: <0.1 mg/dL (ref 0.0–0.3)
Total Bilirubin: 0.9 mg/dL (ref 0.3–1.2)
Total Protein: 6.2 g/dL (ref 6.0–8.3)

## 2014-12-02 MED ORDER — HYDROCOD POLST-CHLORPHEN POLST 10-8 MG/5ML PO LQCR
5.0000 mL | Freq: Two times a day (BID) | ORAL | Status: DC
  Administered 2014-12-02 – 2014-12-09 (×14): 5 mL via ORAL
  Filled 2014-12-02 (×14): qty 5

## 2014-12-02 MED ORDER — GUAIFENESIN 100 MG/5ML PO SYRP
200.0000 mg | ORAL_SOLUTION | ORAL | Status: DC | PRN
  Administered 2014-12-02: 200 mg via ORAL
  Filled 2014-12-02 (×2): qty 10

## 2014-12-02 NOTE — Progress Notes (Signed)
Occupational Therapy Evaluation Patient Details Name: Virginia [name] MRN: [id number] DOB: May 14, 1936 Today's Date: 12/02/2014    History of Present Illness 79 y.o. female admitted with CAP.    Clinical Impression   Patient with confusion, disoriented during session, needed constant cues for safety. She needs SNF vs 24/7 supervision/assistance at home. She also is requiring min-mod A with ADLs at this time, which is below baseline.    Follow Up Recommendations  SNF;Supervision/Assistance - 24 hour    Equipment Recommendations  Other (comment) (tbd)    Recommendations for Other Services PT consult     Precautions / Restrictions Precautions Precautions: Fall Precaution Comments: monitor O2 Restrictions Weight Bearing Restrictions: No      Mobility Bed Mobility Overal bed mobility: Needs Assistance Bed Mobility: Sit to Supine     Supine to sit: Supervision        Transfers Overall transfer level: Needs assistance Equipment used: 1 person hand held assist Transfers: Sit to/from [employer] Sit to Stand: Min assist Stand pivot transfers: Min guard            Balance                                            ADL Overall ADL's : Needs assistance/impaired Eating/Feeding: Set up;Sitting   Grooming: Wash/dry hands;Wash/dry face;Set up;Sitting   Upper Body Bathing: Minimal assitance;Sitting   Lower Body Bathing: Moderate assistance;Sit to/from stand   Upper Body Dressing : Minimal assistance;Sitting       Toilet Transfer: Minimal assistance;Stand-pivot;BSC   Toileting- Clothing Manipulation and Hygiene: Minimal assistance;Sit to/from stand       Functional mobility during ADLs: Minimal assistance;Cueing for safety General ADL Comments: Patient very confused during session. Asked if there were spiders on floor, stated, "That's not my bed. Virginia [name]'s sleeping there." in reference to her hospital bed. Needed frequent  redirection. Needed mod-min A with ADLs this date. Patient left in bed, bed alarm in place, all needs within reach.     Vision                     Perception     Praxis      Pertinent Vitals/Pain Pain Assessment: No/denies pain     Hand Dominance Right   Extremity/Trunk Assessment Upper Extremity Assessment Upper Extremity Assessment: Overall WFL for tasks assessed   Lower Extremity Assessment Lower Extremity Assessment: Defer to PT evaluation   Cervical / Trunk Assessment Cervical / Trunk Assessment: Normal   Communication Communication Communication: No difficulties   Cognition Arousal/Alertness: Awake/alert Behavior During Therapy: WFL for tasks assessed/performed Overall Cognitive Status: History of cognitive impairments - at baseline (h/o memory impairment)       Memory: Decreased short-term memory             General Comments       Exercises       Shoulder Instructions      Home Living Family/patient expects to be discharged to:: Private residence Living Arrangements: Other relatives Available Help at Discharge: Family;Available PRN/intermittently Type of Home: House Home Access: Stairs to enter [employer] of Steps: 3 Entrance Stairs-Rails: None Home Layout: Two level;Able to live on main level with bedroom/bathroom Alternate Level Stairs-Number of Steps: doesn't use 2nd floor   Bathroom Shower/Tub: Occupational psychologist: Standard     Home Equipment: Environmental consultant -  2 wheels;Cane - single point;Shower seat   Additional Comments: lives with brother      Prior Functioning/Environment Level of Independence: Independent             OT Diagnosis: Generalized weakness   OT Problem List: Decreased strength;Decreased activity tolerance;Decreased cognition;Decreased safety awareness;Decreased knowledge of use of DME or AE;Cardiopulmonary status limiting activity   OT Treatment/Interventions: Self-care/ADL  training;Energy conservation;DME and/or AE instruction;Therapeutic activities;Patient/family education    OT Goals(Current goals can be found in the care plan section) Acute Rehab OT Goals Patient Stated Goal: to get stronger OT Goal Formulation: With patient Time For Goal Achievement: 12/09/14 Potential to Achieve Goals: Good  OT Frequency: Min 2X/week   Barriers to D/C: Decreased caregiver support          Co-evaluation              End of Session Equipment Utilized During Treatment: Oxygen Nurse Communication: Other (comment) (pt performed bathing during session)  Activity Tolerance: Patient tolerated treatment well Patient left: in bed;with call bell/phone within reach;with bed alarm set   Time: 1450-1511 OT Time Calculation (min): 21 min Charges:  OT General Charges $OT Visit: 1 Procedure OT Evaluation $Initial OT Evaluation Tier I: 1 Procedure OT Treatments $Self Care/Home Management : 8-22 mins G-Codes:    Virginia [name] A 2014-12-19, 3:33 PM

## 2014-12-02 NOTE — Progress Notes (Signed)
[facility] PROGRESS NOTE  Virginia [name] [id number] DOB: Nov 18, 1936 DOA: 11/30/2014  PCP: Tivis Ringer, MD  Brief HPI: 79 year old Caucasian female was admitted due to presence of cough and wheezing. She was found to be dehydrated with hyponatremia. She was given IV fluids and started on IV antibiotics. She was also started on intravenous steroids.  Past medical history:  Past Medical History  Diagnosis Date  . Hyperlipidemia   . Hypertension   . Carotid artery occlusion   . Fall at home Sept. 2013  . Memory disorder 10/24/2014    Consultants: None  Procedures: None  Antibiotics: Levaquin  Subjective: Patient continues to have a cough. Denies any expectoration. Has wheezing. Denies any chest pain. Little bit short of breath at times. No nausea, vomiting. She was confused day before yesterday.  Objective: Vital Signs  Filed Vitals:   12/01/14 2144 12/02/14 0155 12/02/14 0515 12/02/14 0837  BP: 146/59  146/82   Pulse: 75  80   Temp: 98.3 F (36.8 C)  98 F (36.7 C)   TempSrc: Oral  Oral   Resp: 18  18   Height:      Weight:      SpO2: 97% 96% 97% 94%    Intake/Output Summary (Last 24 hours) at 12/02/14 1125 Last data filed at 12/02/14 0600  Gross per 24 hour  Intake   2040 ml  Output   1500 ml  Net    540 ml   Filed Weights   11/30/14 1209  Weight: 74.39 kg (164 lb)    General appearance: alert, cooperative, appears stated age and no distress Resp: Wheezing bilaterally with a few rhonchi. Crackles at the bases Cardio: regular rate and rhythm, S1, S2 normal, no murmur, click, rub or gallop GI: soft, non-tender; bowel sounds normal; no masses,  no organomegaly Extremities: extremities normal, atraumatic, no cyanosis or edema Neurologic: She is alert. She was oriented to person, place, time, after multiple attempts. No facial asymmetry. Tongue is midline. Motor strength equal bilateral upper and lower extremities.  Lab Results:  Basic  Metabolic Panel:  Recent Labs Lab 11/30/14 1815 12/01/14 0700 12/01/14 1210 12/01/14 1921 12/02/14 0400  NA 125* 121* 124* 125* 130*  K 4.8 3.7 4.4 4.3 4.8  CL 89* 88* 89* 92* 95*  CO2 29 24 26 27 26   GLUCOSE 137* 166* 140* 196* 147*  BUN 11 9 9 10 12   CREATININE 0.50 0.48* 0.48* 0.50 0.67  CALCIUM 8.4 7.9* 8.0* 8.1* 8.4   Liver Function Tests:  Recent Labs Lab 11/30/14 1350 12/02/14 0436  AST 46* 41*  ALT 33 65*  ALKPHOS 64 86  BILITOT 1.5* 0.9  PROT 6.9 6.2  ALBUMIN 3.1* 2.8*   CBC:  Recent Labs Lab 11/30/14 1350 12/02/14 0433  WBC 9.2 12.0*  HGB 13.4 12.6  HCT 40.7 38.2  MCV 87.0 86.4  PLT 238 256   CBG:  Recent Labs Lab 12/01/14 1135 12/01/14 1711 12/02/14 0736  GLUCAP 126* 137* 124*    Studies/Results: Dg Chest 2 View  11/30/2014   CLINICAL DATA:  Community acquired bacterial pneumonia.  EXAM: CHEST  2 VIEW  COMPARISON:  November 09, 2009.  FINDINGS: The heart size and mediastinal contours are within normal limits. Both lungs are clear. No pneumothorax or pleural effusion is noted. The visualized skeletal structures are unremarkable.  IMPRESSION: No acute cardiopulmonary abnormality seen.   Electronically Signed   By: Sabino Dick M.D.   On: 11/30/2014 14:53  Dg Abd 1 View  12/01/2014   CLINICAL DATA:  Bloating, nausea, vomiting.  EXAM: ABDOMEN - 1 VIEW  COMPARISON:  CT 10/14/2004  FINDINGS: Nonobstructive bowel gas pattern. No supine evidence of free air. No organomegaly. Vascular calcifications noted.  IMPRESSION: No evidence of obstruction.  No acute findings.   Electronically Signed   By: Rolm Baptise M.D.   On: 12/01/2014 11:25   US Abdomen Complete  12/02/2014   CLINICAL DATA:  Abdominal pain with nausea and vomiting for 1 day.  EXAM: ULTRASOUND ABDOMEN COMPLETE  COMPARISON:  10/14/2004 CT.  Plain film of 1 day prior.  FINDINGS: Gallbladder: No gallstones or wall thickening visualized. No sonographic Murphy sign noted.  Common bile duct:  Diameter: Normal, 4 mm.  Liver: No focal lesion identified. Within normal limits in parenchymal echogenicity.  IVC: No abnormality visualized.  Pancreas: Visualized portion unremarkable.  Spleen: Size and appearance within normal limits.  Right Kidney: Length: 8.5 cm. Echogenicity within normal limits. No mass or hydronephrosis visualized.  Left Kidney: Length: 9.2 cm. Echogenicity within normal limits. No mass or hydronephrosis visualized.  Abdominal aorta: No aneurysm visualized.  Other findings: No ascites. Right-sided pleural effusion incidentally noted.  IMPRESSION: 1.  No acute abdominal process. 2. Right-sided pleural effusion.   Electronically Signed   By: Abigail Miyamoto M.D.   On: 12/02/2014 11:15    Medications:  Scheduled: . amLODipine  2.5 mg Oral Daily  . aspirin EC  81 mg Oral Daily  . B-complex with vitamin C  1 tablet Oral Daily  . colesevelam  1,875 mg Oral BID WC  . donepezil  5 mg Oral BID  . enoxaparin (LOVENOX) injection  40 mg Subcutaneous Q24H  . insulin aspart  0-9 Units Subcutaneous TID WC  . ipratropium-albuterol  3 mL Nebulization Q6H  . levofloxacin (LEVAQUIN) IV  750 mg Intravenous Q24H  . losartan  25 mg Oral Daily  . methylPREDNISolone (SOLU-MEDROL) injection  60 mg Intravenous Q12H  . multivitamin with minerals  1 tablet Oral Daily  . multivitamin-lutein  1 capsule Oral Daily  . nebivolol  10 mg Oral Daily  . omega-3 acid ethyl esters  1 g Oral Daily  . Vitamin D (Ergocalciferol)  50,000 Units Oral Q7 days   Continuous: . sodium chloride 50 mL/hr at 12/02/14 2440   NUU:[id number], guaifenesin, methocarbamol, metoCLOPramide (REGLAN) injection, ondansetron (ZOFRAN) IV  Assessment/Plan:  Principal Problem:   CAP (community acquired pneumonia) Active Problems:   Memory disorder   Nausea and vomiting   Community acquired bacterial pneumonia   Hyponatremia    Presumed community-acquired pneumonia versus acute bronchitis Continue with the current plan  with antibiotics, steroids and nebulizer treatments. We will repeat chest x-ray today as she has been adequately hydrated.  Nausea with abnormal LFTs. Ultrasound of the abdomen does not show any concerning intra-abdominal finding. No gallstones were noted. CBD was normal size. Nausea, likely due to respiratory issues. LFTs are slightly elevated. This can be monitored closely here and then followed up as an outpatient.  Hyponatremia Sodium is improved with IV fluids. Decrease the rate. Hyponatremia, most likely due to dehydration, poor oral intake or SIADH due to the above-mentioned symptoms.  Memory disorder Continue with Aricept. Probably contributing due to her episode of confusion the other night. Reorient daily. Mobilize. For some reason she is on a dysphagia diet. We'll obtain swallow evaluation.  History of essential hypertension. Continue with her home medications. Blood pressure stable.  DVT Prophylaxis: Lovenox  Code Status: Full code  Family Communication: Discussed with the patient and her brother  Disposition Plan: Mobilize. PT and OT to see. Repeat chest x-ray pending.    LOS: 2 days   [facility] Pager [phone] 12/02/2014, 11:25 AM  If 7PM-7AM, please contact night-coverage at [email or web address], password [facility]

## 2014-12-02 NOTE — Evaluation (Signed)
Clinical/Bedside Swallow Evaluation Patient Details  Name: Virginia [name] MRN: [id number] Date of Birth: 11/25/1936  Today's Date: 12/02/2014 Time: 8453-6468 SLP Time Calculation (min) (ACUTE ONLY): 31 min  Past Medical History:  Past Medical History  Diagnosis Date  . Hyperlipidemia   . Hypertension   . Carotid artery occlusion   . Fall at home Sept. 2013  . Memory disorder 10/24/2014   Past Surgical History:  Past Surgical History  Procedure Laterality Date  . Carotid endarterectomy      left CEA  . Tonsillectomy    . Appendectomy    . Breast reduction surgery    . Skin cancer removal    . Cataract extraction Bilateral   . Eye surgery     HPI:  79 yo female admit to [facility] diagnosed with cough/wheeze - diagnosed with CAP.  Poor intake reported by pt and confirmed by son for several days prior to admit and pt was dehydrated with sodium levels off upon admit.  Swallow evaluation ordered - CXR indicated pleural effusions, right upper lobe pna.  Pt was recently nauseated and vomited during MD visit last week per pt's son.  Ultrasound negative for acute process, shows pleural effusion.  Recently pt has been diagnosed with a cognitive deficit and is taking medications for it.     Assessment / Plan / Recommendation Clinical Impression  Pt with negative CN exam and son reports pt with chronic large pill dysphagia.  SLP observed pt consuming small amounts of water *on fluid restriction* with timely swallow and clear voice.  Crumbly cracker consumption resulted in post - swallow immediate subtle cough- concerning for agitation of larynx/pharynx.   Cough cleared adequately with liquid swallow.   Educated pt/son to aspiration precautions/compensation strategies to mitigate aspiration risk.  Also provided them with Biotene for xerostomia compensations.  Recommend advance diet to regular/thin with strict precautions.  SLP to sign off as all education completed, please reorder if desire.   Son  reports pt's daughter [name] was recently ill with suspected viral infection - ? if she obtained from [name] per son.      Aspiration Risk  Mild    Diet Recommendation Regular;Thin liquid   Liquid Administration via: Cup Medication Administration: Whole meds with liquid Supervision: Patient able to self feed Compensations: Slow rate;Small sips/bites (start meal with liquids, consume liquids t/o meal) Postural Changes and/or Swallow Maneuvers: Seated upright 90 degrees;Upright 30-60 min after meal    Other  Recommendations Oral Care Recommendations: Oral care BID   Follow Up Recommendations  None    Frequency and Duration        Pertinent Vitals/Pain Afebrile, decreased      Swallow Study Prior Functional Status  Type of Home: House Available Help at Discharge: Family;Available PRN/intermittently    General Date of Onset: 11/29/14 HPI: 79 yo female admit to [facility] diagnosed with cough/wheeze - diagnosed with CAP.  Poor intake reported by pt and confirmed by son for several days prior to admit and pt was dehydrated with sodium levels off upon admit.  Swallow evaluation ordered - CXR indicated pleural effusions, right upper lobe pna.  Pt was recently nauseated and vomited during MD visit last week per pt's son.  Ultrasound negative for acute process, shows pleural effusion.  Recently pt has been diagnosed with a cognitive deficit and is taking medications for it.   Type of Study: Bedside swallow evaluation Diet Prior to this Study: Dysphagia 3 (soft);Thin liquids Temperature Spikes Noted: No Respiratory Status:  Nasal cannula History of Recent Intubation: No Behavior/Cognition: Alert;Cooperative;Pleasant mood;Confused Oral Cavity - Dentition: Adequate natural dentition Self-Feeding Abilities: Able to feed self Patient Positioning: Upright in bed Baseline Vocal Quality: Clear Volitional Cough: Strong Volitional Swallow: Able to elicit    Oral/Motor/Sensory Function Overall  Oral Motor/Sensory Function: Appears within functional limits for tasks assessed   Ice Chips Ice chips: Not tested   Thin Liquid Thin Liquid: Within functional limits Presentation: Self Fed;Cup    Nectar Thick Nectar Thick Liquid: Not tested   Honey Thick Honey Thick Liquid: Not tested   Puree Puree: Not tested   Solid   GO    Solid: Impaired Presentation: Self Fed Oral Phase Impairments: Reduced lingual movement/coordination;Impaired anterior to posterior transit Pharyngeal Phase Impairments: Suspected delayed Swallow;Cough - Delayed Other Comments: cough was alleviated with swallow of liquid, suspect particulate from cracker agitated pt's larynx/pharynx       Luanna Salk, [facility] SLP [phone]

## 2014-12-02 NOTE — Evaluation (Signed)
SLP Cancellation Note  Patient Details Name: Virginia [name] MRN: [id number] DOB: [birth year]   Cancelled treatment:       Reason Eval/Treat Not Completed:  (order for BSE received, note pt for [place] of abdomen and is npo until test completed, will follow up )   Claudie Fisherman, [facility] SLP [phone]

## 2014-12-02 NOTE — Progress Notes (Signed)
ABD U/S completed, pt's diet resumed. Pt tolerated lunch of hamburger without nausea.  Pt tolerated sitting in chair for 45 minutes before asking to go back to bed.

## 2014-12-03 LAB — HEMOGLOBIN A1C
Hgb A1c MFr Bld: 5.8 % — ABNORMAL HIGH (ref <5.7)
MEAN PLASMA GLUCOSE: 120 mg/dL — AB (ref <117)

## 2014-12-03 LAB — CBC
HCT: 39.2 % (ref 36.0–46.0)
HEMOGLOBIN: 12.4 g/dL (ref 12.0–15.0)
MCH: 28.3 pg (ref 26.0–34.0)
MCHC: 31.6 g/dL (ref 30.0–36.0)
MCV: 89.5 fL (ref 78.0–100.0)
Platelets: 298 10*3/uL (ref 150–400)
RBC: 4.38 MIL/uL (ref 3.87–5.11)
RDW: 13.9 % (ref 11.5–15.5)
WBC: 11.4 10*3/uL — ABNORMAL HIGH (ref 4.0–10.5)

## 2014-12-03 LAB — COMPREHENSIVE METABOLIC PANEL
ALT: 53 U/L — ABNORMAL HIGH (ref 0–35)
ANION GAP: 4 — AB (ref 5–15)
AST: 30 U/L (ref 0–37)
Albumin: 2.9 g/dL — ABNORMAL LOW (ref 3.5–5.2)
Alkaline Phosphatase: 73 U/L (ref 39–117)
BUN: 19 mg/dL (ref 6–23)
CO2: 27 mmol/L (ref 19–32)
Calcium: 8.2 mg/dL — ABNORMAL LOW (ref 8.4–10.5)
Chloride: 95 mEq/L — ABNORMAL LOW (ref 96–112)
Creatinine, Ser: 0.82 mg/dL (ref 0.50–1.10)
GFR, EST AFRICAN AMERICAN: 79 mL/min — AB (ref >90)
GFR, EST NON AFRICAN AMERICAN: 68 mL/min — AB (ref >90)
GLUCOSE: 152 mg/dL — AB (ref 70–99)
Potassium: 4.5 mmol/L (ref 3.5–5.1)
SODIUM: 126 mmol/L — AB (ref 135–145)
TOTAL PROTEIN: 6 g/dL (ref 6.0–8.3)
Total Bilirubin: 0.7 mg/dL (ref 0.3–1.2)

## 2014-12-03 LAB — GLUCOSE, CAPILLARY
GLUCOSE-CAPILLARY: 142 mg/dL — AB (ref 70–99)
Glucose-Capillary: 152 mg/dL — ABNORMAL HIGH (ref 70–99)
Glucose-Capillary: 124 mg/dL — ABNORMAL HIGH (ref 70–99)
Glucose-Capillary: 129 mg/dL — ABNORMAL HIGH (ref 70–99)

## 2014-12-03 LAB — OSMOLALITY: Osmolality: 258 mOsm/kg — ABNORMAL LOW (ref 275–300)

## 2014-12-03 MED ORDER — HYDRALAZINE HCL 20 MG/ML IJ SOLN
5.0000 mg | Freq: Once | INTRAMUSCULAR | Status: AC
  Administered 2014-12-03: 5 mg via INTRAVENOUS
  Filled 2014-12-03: qty 1

## 2014-12-03 MED ORDER — PROSIGHT PO TABS
1.0000 | ORAL_TABLET | Freq: Every day | ORAL | Status: DC
  Administered 2014-12-03 – 2014-12-09 (×5): 1 via ORAL
  Filled 2014-12-03 (×7): qty 1

## 2014-12-03 NOTE — Progress Notes (Signed)
Patients BP was 184/64 by dinamap, Hospitalist paged and made aware, requested a manuel BP which was 164/72. Hospitalist paged with that result and new orders received and given to patient. Patient and brother aware of above.

## 2014-12-03 NOTE — Progress Notes (Signed)
[facility] PROGRESS NOTE  Virginia [name] KKX:[id number] DOB: February 26, 1936 DOA: 11/30/2014  PCP: Tivis Ringer, MD  Brief HPI: 79 year old Caucasian female was admitted due to presence of cough and wheezing. She was found to be dehydrated with hyponatremia. She was given IV fluids and started on IV antibiotics. She was also started on intravenous steroids.  Past medical history:  Past Medical History  Diagnosis Date  . Hyperlipidemia   . Hypertension   . Carotid artery occlusion   . Fall at home Sept. 2013  . Memory disorder 10/24/2014    Consultants: None  Procedures: None  Antibiotics: Levaquin  Subjective: Patient continues to have a cough but feels better. She is breathing better. Denies any chest pain. Her son is at bedside.  Objective: Vital Signs  Filed Vitals:   12/02/14 1653 12/03/14 0222 12/03/14 0605 12/03/14 0804  BP:   150/80   Pulse:   78   Temp:   98.5 F (36.9 C)   TempSrc:   Oral   Resp:   18   Height:      Weight:      SpO2: 97% 99% 98% 96%    Intake/Output Summary (Last 24 hours) at 12/03/14 1696 Last data filed at 12/03/14 0606  Gross per 24 hour  Intake   1420 ml  Output   1350 ml  Net     70 ml   Filed Weights   11/30/14 1209  Weight: 74.39 kg (164 lb)    General appearance: alert, cooperative, appears stated age and no distress Resp: continues to have wheezing with perhaps slightly improved air entry bilaterally. Crackles at the bases Cardio: regular rate and rhythm, S1, S2 normal, no murmur, click, rub or gallop GI: soft, non-tender; bowel sounds normal; no masses,  no organomegaly Extremities: extremities normal, atraumatic, no cyanosis or edema Neurologic: She is alert. No focal neurological deficits.   Lab Results:  Basic Metabolic Panel:  Recent Labs Lab 12/01/14 0700 12/01/14 1210 12/01/14 1921 12/02/14 0400 12/03/14 0500  NA 121* 124* 125* 130* 126*  K 3.7 4.4 4.3 4.8 4.5  CL 88* 89* 92* 95* 95*  CO2  24 26 27 26 27   GLUCOSE 166* 140* 196* 147* 152*  BUN 9 9 10 12 19   CREATININE 0.48* 0.48* 0.50 0.67 0.82  CALCIUM 7.9* 8.0* 8.1* 8.4 8.2*   Liver Function Tests:  Recent Labs Lab 11/30/14 1350 12/02/14 0436 12/03/14 0500  AST 46* 41* 30  ALT 33 65* 53*  ALKPHOS 64 86 73  BILITOT 1.5* 0.9 0.7  PROT 6.9 6.2 6.0  ALBUMIN 3.1* 2.8* 2.9*   CBC:  Recent Labs Lab 11/30/14 1350 12/02/14 0433 12/03/14 0500  WBC 9.2 12.0* 11.4*  HGB 13.4 12.6 12.4  HCT 40.7 38.2 39.2  MCV 87.0 86.4 89.5  PLT 238 256 298   CBG:  Recent Labs Lab 12/01/14 1711 12/02/14 0736 12/02/14 1214 12/02/14 1714 12/03/14 0734  GLUCAP 137* 124* 152* 131* 152*    Studies/Results: Dg Chest 2 View  12/02/2014   CLINICAL DATA:  Acute onset cough and chest congestion. Former smoker. Current history of hypertension.  EXAM: CHEST  2 VIEW  COMPARISON:  11/30/2014, 11/09/2009.  FINDINGS: Cardiac silhouette upper normal in size to slightly enlarged, unchanged. Interval development of patchy airspace opacities throughout both lungs, greatest in the right upper lobe. New small to moderate-sized bilateral pleural effusions. Osseous demineralization. Calcification within the right supraspinatus tendon.  IMPRESSION: 1. New patchy airspace opacities throughout both  lungs, greatest in the right upper lobe. Asymmetric airspace pulmonary edema and multifocal pneumonia can have this appearance. 2. New small to moderate-sized bilateral pleural effusions.   Electronically Signed   By: Evangeline Dakin M.D.   On: 12/02/2014 12:07   Dg Abd 1 View  12/01/2014   CLINICAL DATA:  Bloating, nausea, vomiting.  EXAM: ABDOMEN - 1 VIEW  COMPARISON:  CT 10/14/2004  FINDINGS: Nonobstructive bowel gas pattern. No supine evidence of free air. No organomegaly. Vascular calcifications noted.  IMPRESSION: No evidence of obstruction.  No acute findings.   Electronically Signed   By: Rolm Baptise M.D.   On: 12/01/2014 11:25   US Abdomen  Complete  12/02/2014   CLINICAL DATA:  Abdominal pain with nausea and vomiting for 1 day.  EXAM: ULTRASOUND ABDOMEN COMPLETE  COMPARISON:  10/14/2004 CT.  Plain film of 1 day prior.  FINDINGS: Gallbladder: No gallstones or wall thickening visualized. No sonographic Murphy sign noted.  Common bile duct: Diameter: Normal, 4 mm.  Liver: No focal lesion identified. Within normal limits in parenchymal echogenicity.  IVC: No abnormality visualized.  Pancreas: Visualized portion unremarkable.  Spleen: Size and appearance within normal limits.  Right Kidney: Length: 8.5 cm. Echogenicity within normal limits. No mass or hydronephrosis visualized.  Left Kidney: Length: 9.2 cm. Echogenicity within normal limits. No mass or hydronephrosis visualized.  Abdominal aorta: No aneurysm visualized.  Other findings: No ascites. Right-sided pleural effusion incidentally noted.  IMPRESSION: 1.  No acute abdominal process. 2. Right-sided pleural effusion.   Electronically Signed   By: Abigail Miyamoto M.D.   On: 12/02/2014 11:15    Medications:  Scheduled: . amLODipine  2.5 mg Oral Daily  . aspirin EC  81 mg Oral Daily  . B-complex with vitamin C  1 tablet Oral Daily  . chlorpheniramine-HYDROcodone  5 mL Oral Q12H  . colesevelam  1,875 mg Oral BID WC  . donepezil  5 mg Oral BID  . enoxaparin (LOVENOX) injection  40 mg Subcutaneous Q24H  . insulin aspart  0-9 Units Subcutaneous TID WC  . ipratropium-albuterol  3 mL Nebulization Q6H  . levofloxacin (LEVAQUIN) IV  750 mg Intravenous Q24H  . losartan  25 mg Oral Daily  . methylPREDNISolone (SOLU-MEDROL) injection  60 mg Intravenous Q12H  . multivitamin  1 tablet Oral Daily  . multivitamin with minerals  1 tablet Oral Daily  . nebivolol  10 mg Oral Daily  . omega-3 acid ethyl esters  1 g Oral Daily  . Vitamin D (Ergocalciferol)  50,000 Units Oral Q7 days   Continuous: . sodium chloride 50 mL/hr at 12/03/14 0619   ZOX:[id number], guaifenesin, methocarbamol,  metoCLOPramide (REGLAN) injection, ondansetron (ZOFRAN) IV  Assessment/Plan:  Principal Problem:   CAP (community acquired pneumonia) Active Problems:   Memory disorder   Nausea and vomiting   Hyponatremia   Essential hypertension    Community-acquired pneumonia with wheezing Repeat chest x-ray did show multifocal pneumonia. Continue with current antibiotics. Continue with nebulizer treatments and steroids.   Nausea with abnormal LFTs. Symptomatically improved. Ultrasound of the abdomen does not show any concerning intra-abdominal finding. No gallstones were noted. CBD was normal size. Nausea, likely due to respiratory issues. LFTs are slightly elevated but stable. This can be followed up as an outpatient.  Hyponatremia Sodium did improve yesterday, but has decreased again today. We will increase the rate of IV fluids. Hyponatremia, most likely due to dehydration, poor oral intake or SIADH due to the above-mentioned symptoms. Recheck in the  morning.  Memory disorder Continue with Aricept. Probably contributing due to her episode of confusion the other night. Reorient daily. Mobilize. She was seen by speech therapist and is now on an appropriate diet.  History of essential hypertension. Blood pressure somewhat inadequately controlled. Continue with her home regimen for now. May require dose adjustment if her blood pressure remains elevated.   DVT Prophylaxis: Lovenox    Code Status: Full code  Family Communication: Discussed with the patient and her brother  Disposition Plan: Mobilize. PT and OT. Possible need for SNF when ready for discharge.    LOS: 3 days   [facility] Pager [phone] 12/03/2014, 8:32 AM  If 7PM-7AM, please contact night-coverage at [email or web address], password [facility]

## 2014-12-03 NOTE — Clinical Social Work Psychosocial (Signed)
Clinical Social Work Department BRIEF PSYCHOSOCIAL ASSESSMENT 12/03/2014  Patient:  Virginia [name], Virginia [name]     Account Number:  [id number]     Admit date:  11/30/2014  Clinical Social Worker:  Dede Query, CLINICAL SOCIAL WORKER  Date/Time:  12/03/2014 03:12 PM  Referred by:  Physician  Date Referred:  12/01/2014 Referred for  SNF Placement   Other Referral:   Interview type:  Patient Other interview type:   and son Virginia [name]    PSYCHOSOCIAL DATA Living Status:  ALONE Admitted from facility:   Level of care:   Primary support name:  Virginia [name] Primary support relationship to patient:  CHILD, ADULT Degree of support available:   medium    CURRENT CONCERNS  Other Concerns:    SOCIAL WORK ASSESSMENT / PLAN CSW prompted discussion about pt's history at bedside with pt and [name].  Pt has been living alone and began coughing and not eating.  Pt developed double pneumonia needing hospitalization.  Pt's son Virginia [name] lives close by but has back problems so he cannot help pt ambulate.  Pt's daughte is an Therapist, sports and  is in process of moving to [place] this week so she will not be available to help pt in her home.  Pt's son stated that currently pt cannot even use the bathroom by herself.  Pt's son also discussed that pt has had memory problems in the past and has been taking a medication to help with this but with recent sickness pt's memory has gotten worse.  CSW discussed faxing out information to SNF and family is in agreement with this. Pt stated "I think I need to" (go to snf).   Assessment/plan status:  Psychosocial Support/Ongoing Assessment of Needs Other assessment/ plan:   Information/referral to community resources:   CSW will fax pt information out to SNF's    PATIENT'S/FAMILY'S RESPONSE TO PLAN OF CARE: Pt and son very pleasant and cooperative with discussing history and placement options.  Pt's son and daughter and brother all have HCPOA and share this for pt.  Pt and son stated that when bed  offers come in please bring them to pt's room as they have set family on 24/7 status staying with pt in her room until discharge. The family will make a decision among all of them   .Dede Query, LCSW [facility] Clinical Social Worker - Weekend Coverage cell #: [phone]

## 2014-12-04 ENCOUNTER — Institutional Professional Consult (permissible substitution): Payer: Medicare Other | Admitting: Internal Medicine

## 2014-12-04 ENCOUNTER — Ambulatory Visit: Payer: Medicare Other

## 2014-12-04 LAB — BASIC METABOLIC PANEL
ANION GAP: 6 (ref 5–15)
BUN: 18 mg/dL (ref 6–23)
CO2: 26 mmol/L (ref 19–32)
Calcium: 8.2 mg/dL — ABNORMAL LOW (ref 8.4–10.5)
Chloride: 100 mEq/L (ref 96–112)
Creatinine, Ser: 0.67 mg/dL (ref 0.50–1.10)
GFR calc Af Amer: >90 mL/min (ref >90)
GFR calc non Af Amer: 83 mL/min — ABNORMAL LOW (ref >90)
GLUCOSE: 147 mg/dL — AB (ref 70–99)
Potassium: 4.2 mmol/L (ref 3.5–5.1)
Sodium: 132 mmol/L — ABNORMAL LOW (ref 135–145)

## 2014-12-04 LAB — CBC
HEMATOCRIT: 40 % (ref 36.0–46.0)
HEMOGLOBIN: 12.3 g/dL (ref 12.0–15.0)
MCH: 28 pg (ref 26.0–34.0)
MCHC: 30.8 g/dL (ref 30.0–36.0)
MCV: 90.9 fL (ref 78.0–100.0)
Platelets: 306 10*3/uL (ref 150–400)
RBC: 4.4 MIL/uL (ref 3.87–5.11)
RDW: 14.4 % (ref 11.5–15.5)
WBC: 12.4 10*3/uL — AB (ref 4.0–10.5)

## 2014-12-04 LAB — GLUCOSE, CAPILLARY
GLUCOSE-CAPILLARY: 111 mg/dL — AB (ref 70–99)
GLUCOSE-CAPILLARY: 93 mg/dL (ref 70–99)
Glucose-Capillary: 120 mg/dL — ABNORMAL HIGH (ref 70–99)

## 2014-12-04 MED ORDER — ALBUTEROL SULFATE (2.5 MG/3ML) 0.083% IN NEBU
2.5000 mg | INHALATION_SOLUTION | Freq: Four times a day (QID) | RESPIRATORY_TRACT | Status: DC | PRN
  Administered 2014-12-04 – 2014-12-05 (×3): 2.5 mg via RESPIRATORY_TRACT
  Filled 2014-12-04 (×3): qty 3

## 2014-12-04 MED ORDER — LOSARTAN POTASSIUM 50 MG PO TABS
50.0000 mg | ORAL_TABLET | Freq: Every day | ORAL | Status: DC
  Administered 2014-12-04 – 2014-12-09 (×6): 50 mg via ORAL
  Filled 2014-12-04 (×6): qty 1

## 2014-12-04 MED ORDER — LEVOFLOXACIN 750 MG PO TABS
750.0000 mg | ORAL_TABLET | Freq: Every day | ORAL | Status: DC
  Administered 2014-12-04 – 2014-12-09 (×6): 750 mg via ORAL
  Filled 2014-12-04 (×6): qty 1

## 2014-12-04 MED ORDER — PREDNISONE 50 MG PO TABS
60.0000 mg | ORAL_TABLET | Freq: Every day | ORAL | Status: DC
  Administered 2014-12-04 – 2014-12-06 (×3): 60 mg via ORAL
  Filled 2014-12-04 (×6): qty 1

## 2014-12-04 MED ORDER — AMLODIPINE BESYLATE 5 MG PO TABS
5.0000 mg | ORAL_TABLET | Freq: Every day | ORAL | Status: DC
  Administered 2014-12-04 – 2014-12-09 (×6): 5 mg via ORAL
  Filled 2014-12-04 (×6): qty 1

## 2014-12-04 MED ORDER — SACCHAROMYCES BOULARDII 250 MG PO CAPS
250.0000 mg | ORAL_CAPSULE | Freq: Two times a day (BID) | ORAL | Status: DC
  Administered 2014-12-04 – 2014-12-09 (×11): 250 mg via ORAL
  Filled 2014-12-04 (×12): qty 1

## 2014-12-04 NOTE — Progress Notes (Signed)
[facility] PROGRESS NOTE  GLORIAJEAN OKUN TGY:[id number] DOB: 02/07/1936 DOA: 11/30/2014  PCP: Tivis Ringer, MD  Brief HPI: 79 year old Caucasian female was admitted due to presence of cough and wheezing. She was found to be dehydrated with hyponatremia. She was given IV fluids and started on IV antibiotics. She was also started on intravenous steroids.  Past medical history:  Past Medical History  Diagnosis Date  . Hyperlipidemia   . Hypertension   . Carotid artery occlusion   . Fall at home Sept. 2013  . Memory disorder 10/24/2014    Consultants: None  Procedures: None  Antibiotics: Levaquin  Subjective: Patient feels better this morning. Cough is improved. Breathing is better.   Objective: Vital Signs  Filed Vitals:   12/03/14 2105 12/03/14 2147 12/03/14 2305 12/04/14 0446  BP: 184/69 164/72 158/68 174/76  Pulse: 88   87  Temp: 98.3 F (36.8 C)   97.6 F (36.4 C)  TempSrc: Oral   Oral  Resp: 20   16  Height:      Weight:      SpO2: 100%   100%    Intake/Output Summary (Last 24 hours) at 12/04/14 2876 Last data filed at 12/03/14 2109  Gross per 24 hour  Intake   1080 ml  Output   1400 ml  Net   -320 ml   Filed Weights   11/30/14 1209  Weight: 74.39 kg (164 lb)    General appearance: alert, cooperative, appears stated age and no distress Resp: Improved air entry. Wheezing significantly improved. Crackles at the bases Cardio: regular rate and rhythm, S1, S2 normal, no murmur, click, rub or gallop GI: soft, non-tender; bowel sounds normal; no masses,  no organomegaly Extremities: extremities normal, atraumatic, no cyanosis or edema Neurologic: She is alert. No focal neurological deficits.   Lab Results:  Basic Metabolic Panel:  Recent Labs Lab 12/01/14 1210 12/01/14 1921 12/02/14 0400 12/03/14 0500 12/04/14 0437  NA 124* 125* 130* 126* 132*  K 4.4 4.3 4.8 4.5 4.2  CL 89* 92* 95* 95* 100  CO2 26 27 26 27 26   GLUCOSE 140* 196*  147* 152* 147*  BUN 9 10 12 19 18   CREATININE 0.48* 0.50 0.67 0.82 0.67  CALCIUM 8.0* 8.1* 8.4 8.2* 8.2*   Liver Function Tests:  Recent Labs Lab 11/30/14 1350 12/02/14 0436 12/03/14 0500  AST 46* 41* 30  ALT 33 65* 53*  ALKPHOS 64 86 73  BILITOT 1.5* 0.9 0.7  PROT 6.9 6.2 6.0  ALBUMIN 3.1* 2.8* 2.9*   CBC:  Recent Labs Lab 11/30/14 1350 12/02/14 0433 12/03/14 0500 12/04/14 0437  WBC 9.2 12.0* 11.4* 12.4*  HGB 13.4 12.6 12.4 12.3  HCT 40.7 38.2 39.2 40.0  MCV 87.0 86.4 89.5 90.9  PLT 238 256 298 306   CBG:  Recent Labs Lab 12/03/14 0734 12/03/14 1257 12/03/14 1706 12/03/14 2143 12/04/14 0820  GLUCAP 152* 124* 129* 142* 111*    Studies/Results: Dg Chest 2 View  12/02/2014   CLINICAL DATA:  Acute onset cough and chest congestion. Former smoker. Current history of hypertension.  EXAM: CHEST  2 VIEW  COMPARISON:  11/30/2014, 11/09/2009.  FINDINGS: Cardiac silhouette upper normal in size to slightly enlarged, unchanged. Interval development of patchy airspace opacities throughout both lungs, greatest in the right upper lobe. New small to moderate-sized bilateral pleural effusions. Osseous demineralization. Calcification within the right supraspinatus tendon.  IMPRESSION: 1. New patchy airspace opacities throughout both lungs, greatest in the right upper lobe.  Asymmetric airspace pulmonary edema and multifocal pneumonia can have this appearance. 2. New small to moderate-sized bilateral pleural effusions.   Electronically Signed   By: Evangeline Dakin M.D.   On: 12/02/2014 12:07   US Abdomen Complete  12/02/2014   CLINICAL DATA:  Abdominal pain with nausea and vomiting for 1 day.  EXAM: ULTRASOUND ABDOMEN COMPLETE  COMPARISON:  10/14/2004 CT.  Plain film of 1 day prior.  FINDINGS: Gallbladder: No gallstones or wall thickening visualized. No sonographic Murphy sign noted.  Common bile duct: Diameter: Normal, 4 mm.  Liver: No focal lesion identified. Within normal limits in  parenchymal echogenicity.  IVC: No abnormality visualized.  Pancreas: Visualized portion unremarkable.  Spleen: Size and appearance within normal limits.  Right Kidney: Length: 8.5 cm. Echogenicity within normal limits. No mass or hydronephrosis visualized.  Left Kidney: Length: 9.2 cm. Echogenicity within normal limits. No mass or hydronephrosis visualized.  Abdominal aorta: No aneurysm visualized.  Other findings: No ascites. Right-sided pleural effusion incidentally noted.  IMPRESSION: 1.  No acute abdominal process. 2. Right-sided pleural effusion.   Electronically Signed   By: Abigail Miyamoto M.D.   On: 12/02/2014 11:15    Medications:  Scheduled: . amLODipine  5 mg Oral Daily  . aspirin EC  81 mg Oral Daily  . B-complex with vitamin C  1 tablet Oral Daily  . chlorpheniramine-HYDROcodone  5 mL Oral Q12H  . colesevelam  1,875 mg Oral BID WC  . donepezil  5 mg Oral BID  . enoxaparin (LOVENOX) injection  40 mg Subcutaneous Q24H  . insulin aspart  0-9 Units Subcutaneous TID WC  . levofloxacin  750 mg Oral Daily  . losartan  50 mg Oral Daily  . multivitamin  1 tablet Oral Daily  . multivitamin with minerals  1 tablet Oral Daily  . nebivolol  10 mg Oral Daily  . omega-3 acid ethyl esters  1 g Oral Daily  . predniSONE  60 mg Oral Q breakfast  . Vitamin D (Ergocalciferol)  50,000 Units Oral Q7 days   Continuous: . sodium chloride 100 mL/hr at 12/04/14 0448   VOJ:[id number], guaifenesin, methocarbamol, metoCLOPramide (REGLAN) injection, ondansetron (ZOFRAN) IV  Assessment/Plan:  Principal Problem:   CAP (community acquired pneumonia) Active Problems:   Memory disorder   Nausea and vomiting   Hyponatremia   Essential hypertension    Community-acquired pneumonia with wheezing Repeat chest x-ray did show multifocal pneumonia. Continue with current antibiotics. Change to oral. Continue with nebulizer treatments as needed and steroids, which will be changed to oral. We will add  Florastor.  Nausea with abnormal LFTs. Symptomatically improved. Ultrasound of the abdomen does not show any concerning intra-abdominal finding. No gallstones were noted. CBD was normal size. Nausea, likely due to respiratory issues. LFTs are slightly elevated but stable. This can be followed up as an outpatient.  Hyponatremia Sodium level is improved this morning. Continue with IV fluids. Hyponatremia, most likely due to dehydration, poor oral intake or SIADH due to the above-mentioned symptoms. Recheck in the morning.  Memory disorder Continue with Aricept. Probably contributed to her episode of confusion the other night. Reorient daily. Mobilize. She was seen by speech therapist and is now on an appropriate diet.  History of essential hypertension. Blood pressure somewhat inadequately controlled. Will increase the dose of her amlodipine and her ARB.   DVT Prophylaxis: Lovenox    Code Status: Full code  Family Communication: Discussed with the patient and her daughter Disposition Plan: Continue to mobilize. Patient  agreeable to go to rehabilitation. Social worker is following. Anticipate discharge tomorrow.    LOS: 4 days   [place] [facility] Pager [phone] 12/04/2014, 9:18 AM  If 7PM-7AM, please contact night-coverage at [email or web address], password [facility]

## 2014-12-04 NOTE — Progress Notes (Signed)
Physical Therapy Treatment Patient Details Name: Virginia [name] MRN: [id number] DOB: 1936/07/22 Today's Date: December 25, 2014    History of Present Illness 79 y.o. female admitted with CAP.     PT Comments    Marked improvement in activity tolerance from last session despite pt reporting fatigue.  Follow Up Recommendations  SNF     Equipment Recommendations  None recommended by PT    Recommendations for Other Services       Precautions / Restrictions Precautions Precautions: Fall Restrictions Weight Bearing Restrictions: No    Mobility  Bed Mobility Overal bed mobility: Needs Assistance Bed Mobility: Sit to Supine     Supine to sit: Supervision     General bed mobility comments: increased time  Transfers Overall transfer level: Needs assistance Equipment used: Rolling walker (2 wheeled) Transfers: Sit to/from Stand Sit to Stand: Min assist         General transfer comment: cues for transition position and use of UEs to self assist  Ambulation/Gait Ambulation/Gait assistance: Min assist Ambulation Distance (Feet): 400 Feet Assistive device: Rolling walker (2 wheeled) Gait Pattern/deviations: Step-through pattern;Decreased step length - right;Decreased step length - left;Shuffle;Trunk flexed Gait velocity: decreased with several standing rests required to complete task   General Gait Details: cues for posture and position from [employer]            Wheelchair Mobility    Modified Rankin (Stroke Patients Only)       Balance Overall balance assessment: Needs assistance Sitting-balance support: Feet supported Sitting balance-Leahy Scale: Good     Standing balance support: Bilateral upper extremity supported Standing balance-Leahy Scale: Poor                      Cognition Arousal/Alertness: Awake/alert Behavior During Therapy: WFL for tasks assessed/performed Overall Cognitive Status: History of cognitive impairments - at baseline        Memory: Decreased short-term memory              Exercises      General Comments        Pertinent Vitals/Pain Pain Assessment: No/denies pain    Home Living                      Prior Function            PT Goals (current goals can now be found in the care plan section) Acute Rehab PT Goals Patient Stated Goal: to get stronger PT Goal Formulation: With patient/family Time For Goal Achievement: 12/15/14 Potential to Achieve Goals: Good Progress towards PT goals: Progressing toward goals    Frequency  Min 3X/week    PT Plan Current plan remains appropriate    Co-evaluation             End of Session Equipment Utilized During Treatment: Gait belt;Oxygen Activity Tolerance: Patient tolerated treatment well Patient left: Other (comment) (sitting at bedside with family)     Time: 3818-2993 PT Time Calculation (min) (ACUTE ONLY): 24 min  Charges:  $Gait Training: 23-37 mins                    G Codes:      Kalonji Zurawski 12-25-14, 4:32 PM

## 2014-12-04 NOTE — Progress Notes (Signed)
OT Cancellation Note  Patient Details Name: ELLARY CASAMENTO MRN: [id number] DOB: 01/04/1936   Cancelled Treatment:    Reason Eval/Treat Not Completed: Other (comment) Pt stating she just ate breakfast and needs to rest and declines working with OT. She states she is just not up for it today. Explained benefits of OOB to help with strengthening but pt still declines. She states OT may check back later today. Will reattempt as schedule allows.  Jules Schick  [phone] 12/04/2014, 9:54 AM

## 2014-12-04 NOTE — Progress Notes (Addendum)
Clinical Social Work Department CLINICAL SOCIAL WORK PLACEMENT NOTE 12/04/2014  Patient:  Virginia [name], Virginia [name]  Account Number:  [id number] Admit date:  11/30/2014  Clinical Social Worker:  Dede Query, CLINICAL SOCIAL WORKER  Date/time:  12/03/2014 03:33 PM  Clinical Social Work is seeking post-discharge placement for this patient at the following level of care:   SKILLED NURSING   (*CSW will update this form in Epic as items are completed)   12/03/2014  Patient/family provided with [place] Department of Clinical Social Work's list of facilities offering this level of care within the geographic area requested by the patient (or if unable, by the patient's family).  12/03/2014  Patient/family informed of their freedom to choose among providers that offer the needed level of care, that participate in Medicare, Medicaid or managed care program needed by the patient, have an available bed and are willing to accept the patient.  12/03/2014  Patient/family informed of [facility]' ownership interest in [facility], as well as of the fact that they are under no obligation to receive care at this facility.  PASARR submitted to EDS on 12/01/2014 PASARR number received on 12/01/2014  FL2 transmitted to all facilities in geographic area requested by pt/family on  12/03/2014 FL2 transmitted to all facilities within larger geographic area on   Patient informed that his/her managed care company has contracts with or will negotiate with  certain facilities, including the following:     Patient/family informed of bed offers received:  12/04/2014 Patient chooses bed at [facility] Physician recommends and patient chooses bed at    Patient to be transferred to  on   Patient to be transferred to facility by  Patient and family notified of transfer on  Name of family member notified:    The following physician request were entered in Epic:   Additional Comments:   Alison Murray,  MSW, [place] Work [phone]

## 2014-12-04 NOTE — Progress Notes (Addendum)
CSW continuing to follow for disposition planning.   CSW met with pt and pt brother at bedside to provide SNF bed offers.   CSW provided SNF bed offers and clarified pt and pt family questions. Pt expressed that she wanted to discuss bed offers with her children before making a decision. CSW expressed understanding.   CSW provided CSW contact information in the instance that pt or pt family have further questions.   Per MD, pt medically ready for discharge tomorrow. Pt aware of this plan.   CSW to follow up with pt re: SNF decision.  CSW to continue to follow to provide support and assist with pt disposition needs.  Addendum 10:57 am:  CSW received phone call from pt stating that she would like [facility] for short term rehab.  CSW contacted [facility] to notify and facility confirmed bed availability for tomorrow.  CSW notified pt and pt family at bedside that bed was secured at [facility] for pt to transition to when medically stable for discharge.   Alison Murray, MSW, [place] Work [phone]

## 2014-12-05 ENCOUNTER — Inpatient Hospital Stay (HOSPITAL_COMMUNITY): Payer: Medicare Other

## 2014-12-05 DIAGNOSIS — J189 Pneumonia, unspecified organism: Principal | ICD-10-CM

## 2014-12-05 DIAGNOSIS — R062 Wheezing: Secondary | ICD-10-CM

## 2014-12-05 DIAGNOSIS — I10 Essential (primary) hypertension: Secondary | ICD-10-CM

## 2014-12-05 DIAGNOSIS — R413 Other amnesia: Secondary | ICD-10-CM

## 2014-12-05 DIAGNOSIS — E871 Hypo-osmolality and hyponatremia: Secondary | ICD-10-CM

## 2014-12-05 LAB — BRAIN NATRIURETIC PEPTIDE: B Natriuretic Peptide: 475.7 pg/mL — ABNORMAL HIGH (ref 0.0–100.0)

## 2014-12-05 LAB — GLUCOSE, CAPILLARY
GLUCOSE-CAPILLARY: 62 mg/dL — AB (ref 70–99)
GLUCOSE-CAPILLARY: 117 mg/dL — AB (ref 70–99)
Glucose-Capillary: 127 mg/dL — ABNORMAL HIGH (ref 70–99)
Glucose-Capillary: 152 mg/dL — ABNORMAL HIGH (ref 70–99)

## 2014-12-05 LAB — BASIC METABOLIC PANEL
ANION GAP: 6 (ref 5–15)
BUN: 17 mg/dL (ref 6–23)
CO2: 28 mmol/L (ref 19–32)
CREATININE: 0.7 mg/dL (ref 0.50–1.10)
Calcium: 8.3 mg/dL — ABNORMAL LOW (ref 8.4–10.5)
Chloride: 102 mEq/L (ref 96–112)
GFR, EST NON AFRICAN AMERICAN: 82 mL/min — AB (ref >90)
Glucose, Bld: 101 mg/dL — ABNORMAL HIGH (ref 70–99)
Potassium: 4.1 mmol/L (ref 3.5–5.1)
Sodium: 136 mmol/L (ref 135–145)

## 2014-12-05 LAB — PROCALCITONIN: Procalcitonin: <0.1 ng/mL

## 2014-12-05 MED ORDER — PANTOPRAZOLE SODIUM 40 MG PO TBEC
40.0000 mg | DELAYED_RELEASE_TABLET | Freq: Two times a day (BID) | ORAL | Status: DC
  Administered 2014-12-05 – 2014-12-09 (×8): 40 mg via ORAL
  Filled 2014-12-05 (×11): qty 1

## 2014-12-05 MED ORDER — OXYMETAZOLINE HCL 0.05 % NA SOLN
2.0000 | Freq: Two times a day (BID) | NASAL | Status: AC
  Administered 2014-12-05 – 2014-12-08 (×7): 2 via NASAL
  Filled 2014-12-05: qty 15

## 2014-12-05 MED ORDER — FUROSEMIDE 10 MG/ML IJ SOLN
40.0000 mg | Freq: Once | INTRAMUSCULAR | Status: AC
  Administered 2014-12-05: 40 mg via INTRAVENOUS
  Filled 2014-12-05: qty 4

## 2014-12-05 MED ORDER — SALINE SPRAY 0.65 % NA SOLN
2.0000 | Freq: Four times a day (QID) | NASAL | Status: DC
  Administered 2014-12-05 – 2014-12-09 (×14): 2 via NASAL
  Filled 2014-12-05: qty 44

## 2014-12-05 MED ORDER — LORATADINE 10 MG PO TABS
10.0000 mg | ORAL_TABLET | Freq: Every day | ORAL | Status: DC
  Administered 2014-12-05 – 2014-12-09 (×5): 10 mg via ORAL
  Filled 2014-12-05 (×5): qty 1

## 2014-12-05 MED ORDER — FLUTICASONE PROPIONATE 50 MCG/ACT NA SUSP
2.0000 | Freq: Two times a day (BID) | NASAL | Status: DC
  Administered 2014-12-05 – 2014-12-09 (×8): 2 via NASAL
  Filled 2014-12-05: qty 16

## 2014-12-05 NOTE — Progress Notes (Addendum)
Patient with 15 pound wt gain since admission. + edema in arms/hands/feet/face. KVO IVF's. [email or web address] not working, so unable to page on call MD, so will have day shift tell rounding MD.  ? Check a BNP.

## 2014-12-05 NOTE — Care Management Note (Signed)
CARE MANAGEMENT NOTE 12/05/2014  Patient:  JANELLY, SWITALSKI   Account Number:  [id number]  Date Initiated:  12/05/2014  Documentation initiated by:  Marney Doctor  Subjective/Objective Assessment:   79 yo admitted with CAP     Action/Plan:   From home with family   Anticipated DC Date:  12/06/2014   Anticipated DC Plan:  [place] referral  Clinical Social Worker      DC Planning Services  CM consult      Choice offered to / List presented to:             Status of service:  In process, will continue to follow Medicare Important Message given?   (If response is "NO", the following Medicare IM given date fields will be blank) Date Medicare IM given:   Medicare IM given by:   Date Additional Medicare IM given:   Additional Medicare IM given by:    Discharge Disposition:    Per UR Regulation:  Reviewed for med. necessity/level of care/duration of stay  If discussed at [place] of Stay Meetings, dates discussed:    Comments:  12/05/14 Marney Doctor RN,BSN,NCM [phone] Chart reviewed. Pt to dc to [facility].  No CM needs noted at this time.  Will continue to follow.

## 2014-12-05 NOTE — Plan of Care (Signed)
Problem: Phase II Progression Outcomes Goal: Tolerating diet Outcome: Progressing Eating "much better" per family

## 2014-12-05 NOTE — Plan of Care (Signed)
Problem: Phase II Progression Outcomes Goal: Dyspnea controlled w/progressive activity Outcome: Progressing Patient reports SOB with exertion "improving"

## 2014-12-05 NOTE — Plan of Care (Signed)
Problem: Phase I Progression Outcomes Goal: Progress activity as tolerated unless otherwise ordered Outcome: Progressing Patient working with PT/OT

## 2014-12-05 NOTE — Progress Notes (Signed)
CSW continuing to follow for disposition planning.  Per MD, pt not yet medically ready for discharge to [facility] today.  CSW contacted facility and left message with admissions coordinator to notify that pt not yet medically ready for discharge.  CSW met with pt and pt brother at bedside to provide support and notify that this CSW had updated [facility].  CSW to continue to follow to provide support and assist with pt discharge planning needs.  Alison Murray, MSW, [place] [facility] [phone]

## 2014-12-05 NOTE — Progress Notes (Signed)
[facility] PROGRESS NOTE  Virginia [name] YSA:[id number] DOB: 10-14-1936 DOA: 11/30/2014  PCP: Tivis Ringer, MD  Brief HPI: 79 year old Caucasian female was admitted due to presence of cough and wheezing. She was found to be dehydrated with hyponatremia. She was given IV fluids and started on IV antibiotics. She was also started on intravenous steroids. She is slowly improved. She was taken off of for intravenous steroids and placed on oral steroids. However, this morning she was noted to be wheezing much more. She was feeling poorly. Chest x-ray was repeated.  Past medical history:  Past Medical History  Diagnosis Date  . Hyperlipidemia   . Hypertension   . Carotid artery occlusion   . Fall at home Sept. 2013  . Memory disorder 10/24/2014    Consultants: None  Procedures: None  Antibiotics: Levaquin  Subjective: Patient feels worse this morning. According to the son, she has been wheezing all night and more short of breath. No nausea, vomiting.   Objective: Vital Signs  Filed Vitals:   12/04/14 2005 12/04/14 2100 12/05/14 0550 12/05/14 0553  BP: 189/80 154/78  158/76  Pulse: 94 88  73  Temp: 98.4 F (36.9 C)   98.2 F (36.8 C)  TempSrc: Oral   Oral  Resp: 19   16  Height:      Weight:   81.194 kg (179 lb)   SpO2: 94%   98%    Intake/Output Summary (Last 24 hours) at 12/05/14 0900 Last data filed at 12/05/14 0550  Gross per 24 hour  Intake   2811 ml  Output   2375 ml  Net    436 ml   Filed Weights   11/30/14 1209 12/05/14 0550  Weight: 74.39 kg (164 lb) 81.194 kg (179 lb)    General appearance: alert, cooperative, appears stated age and no distress Resp: Diffuse end expiratory wheezing bilaterally. Crackles at the bases. No rhonchi.  Cardio: regular rate and rhythm, S1, S2 normal, no murmur, click, rub or gallop GI: soft, non-tender; bowel sounds normal; no masses,  no organomegaly Extremities: extremities normal, atraumatic, no cyanosis or  edema Neurologic: She is alert. No focal neurological deficits.   Lab Results:  Basic Metabolic Panel:  Recent Labs Lab 12/01/14 1921 12/02/14 0400 12/03/14 0500 12/04/14 0437 12/05/14 0505  NA 125* 130* 126* 132* 136  K 4.3 4.8 4.5 4.2 4.1  CL 92* 95* 95* 100 102  CO2 27 26 27 26 28   GLUCOSE 196* 147* 152* 147* 101*  BUN 10 12 19 18 17   CREATININE 0.50 0.67 0.82 0.67 0.70  CALCIUM 8.1* 8.4 8.2* 8.2* 8.3*   Liver Function Tests:  Recent Labs Lab 11/30/14 1350 12/02/14 0436 12/03/14 0500  AST 46* 41* 30  ALT 33 65* 53*  ALKPHOS 64 86 73  BILITOT 1.5* 0.9 0.7  PROT 6.9 6.2 6.0  ALBUMIN 3.1* 2.8* 2.9*   CBC:  Recent Labs Lab 11/30/14 1350 12/02/14 0433 12/03/14 0500 12/04/14 0437  WBC 9.2 12.0* 11.4* 12.4*  HGB 13.4 12.6 12.4 12.3  HCT 40.7 38.2 39.2 40.0  MCV 87.0 86.4 89.5 90.9  PLT 238 256 298 306   CBG:  Recent Labs Lab 12/03/14 2143 12/04/14 0820 12/04/14 1205 12/04/14 1659 12/05/14 0820  GLUCAP 142* 111* 93 120* 62*    Studies/Results: No results found.  Medications:  Scheduled: . amLODipine  5 mg Oral Daily  . aspirin EC  81 mg Oral Daily  . B-complex with vitamin C  1 tablet  Oral Daily  . chlorpheniramine-HYDROcodone  5 mL Oral Q12H  . colesevelam  1,875 mg Oral BID WC  . donepezil  5 mg Oral BID  . enoxaparin (LOVENOX) injection  40 mg Subcutaneous Q24H  . insulin aspart  0-9 Units Subcutaneous TID WC  . levofloxacin  750 mg Oral Daily  . losartan  50 mg Oral Daily  . multivitamin  1 tablet Oral Daily  . multivitamin with minerals  1 tablet Oral Daily  . nebivolol  10 mg Oral Daily  . omega-3 acid ethyl esters  1 g Oral Daily  . predniSONE  60 mg Oral Q breakfast  . saccharomyces boulardii  250 mg Oral BID  . Vitamin D (Ergocalciferol)  50,000 Units Oral Q7 days   Continuous:   ZOX:[id number], albuterol, guaifenesin, methocarbamol, metoCLOPramide (REGLAN) injection, ondansetron (ZOFRAN)  IV  Assessment/Plan:  Principal Problem:   CAP (community acquired pneumonia) Active Problems:   Memory disorder   Nausea and vomiting   Hyponatremia   Essential hypertension    Community-acquired pneumonia with wheezing Worsening wheezing this morning. Chest x-ray to be repeated. She could have some degree of fluid overload as she has been significantly positive in her fluid balance. We will give her a dose of Lasix. BNP to be checked. Stop her IV fluids. Will also consult pulmonology. Continue with current medications for now.   Nausea with abnormal LFTs. Symptomatically improved. Ultrasound of the abdomen does not show any concerning intra-abdominal finding. No gallstones were noted. CBD was normal size. Nausea, likely due to respiratory issues. LFTs are slightly elevated but stable. This can be followed up as an outpatient.  Hyponatremia Sodium level is improved. Hyponatremia, most likely due to dehydration, poor oral intake or SIADH due to the above-mentioned symptoms. Recheck in the morning.  Memory disorder Continue with Aricept. Probably contributed to her episode of confusion the other night. No confusion the last 3 days. Reorient daily. Mobilize. She was seen by speech therapist and is on an appropriate diet.  History of essential hypertension. Blood pressure was somewhat inadequately controlled. The dose of amlodipine and ARB was increased yesterday. Continue to monitor for now.   DVT Prophylaxis: Lovenox    Code Status: Full code  Family Communication: Discussed with the patient and her son Disposition Plan: Continue to mobilize. Patient agreeable to go to rehabilitation. Social worker is following. She has had a setback. Await improvement.    LOS: 5 days   [facility] Pager [phone] 12/05/2014, 9:00 AM  If 7PM-7AM, please contact night-coverage at [email or web address], password [facility]

## 2014-12-05 NOTE — Consult Note (Signed)
Name: Virginia [name] MRN: [id number] DOB: 1936/07/05    ADMISSION DATE:  11/30/2014 CONSULTATION DATE:  1/5  REFERRING MD :  Maryland Pink  CHIEF COMPLAINT:  Dyspnea and pulmonary infiltrates   BRIEF PATIENT DESCRIPTION:  79 year old female admitted on 12/31 w/ working dx of CAP (diffuse pulm infiltrates of CXR) and wheeze. Treated in usual fashion w/ ABX, O2, BDs, and even systemic steroids. Comparison fim on 1/5 from admit show persistent R>L patchy infiltrates but had improved some. PCCM asked to assess on 1/5 as Wheeze and dyspnea persist in spite of above therapies.   >of note wt up 15 lbs from admit on 1/5  SIGNIFICANT EVENTS    STUDIES:  RVP 1/5>>> PCT 1/5>>>  HISTORY OF PRESENT ILLNESS:  This is a 79 year old female who was admitted on 12/31 from her PCP w/ CC: 2 d h/o increased sinus congestion, PND, cough, subjective fever and no energy. Initial CXR showed bilateral patchy pulmonary infiltrates. Was admitted to the medical service w/ working dx of CAP. Since admit has been treated supportively w/ IV antibiotics, IV hydration (was hyponatremic and dehydrated on presentation), systemic steroids, got supplemental oxygen and scheduled nebulized BDs. She had made some improvement but continues to have significant loud audible wheeze which has not improved w/ all the current interventions. A f/u CXR showed her bilateral pulmonary infiltrates to have improved some. PCCM was asked to consult on 1/5 given on-going dyspnea and wheeze in spite of current interventions.   PAST MEDICAL HISTORY :   has a past medical history of Hyperlipidemia; Hypertension; Carotid artery occlusion; Fall at home (Sept. 2013); and Memory disorder (10/24/2014).  has past surgical history that includes Carotid endarterectomy; Tonsillectomy; Appendectomy; Breast reduction surgery; skin cancer removal; Cataract extraction (Bilateral); and Eye surgery. Prior to Admission medications   Medication Sig Start Date End  Date Taking? Authorizing Provider  amLODipine (NORVASC) 2.5 MG tablet Take 2.5 mg by mouth daily as needed (high blood pressure). Only if BP is higher than 170 for more than 2-3 hours 10/17/14  Yes Historical Provider, MD  ARTIFICIAL TEAR OP Place 1 drop into both eyes 2 (two) times daily.   Yes Historical Provider, MD  aspirin 81 MG tablet Take 81 mg by mouth daily.   Yes Historical Provider, MD  b complex vitamins tablet Take 1 tablet by mouth daily.   Yes Historical Provider, MD  BYSTOLIC 10 MG tablet Take 10 mg by mouth daily. 10/11/14  Yes Historical Provider, MD  co-enzyme Q-10 50 MG capsule Take 100 mg by mouth daily.    Yes Historical Provider, MD  colesevelam (WELCHOL) 625 MG tablet Take 1,875 mg by mouth 2 (two) times daily with a meal. With Lunch and Dinner   Yes Historical Provider, MD  donepezil (ARICEPT) 5 MG tablet Take 5 mg by mouth 2 (two) times daily.   Yes Historical Provider, MD  losartan (COZAAR) 25 MG tablet Take 25 mg by mouth daily. 10/11/14  Yes Historical Provider, MD  Multiple Vitamins-Minerals (ALIVE WOMENS 50+ PO) Take by mouth daily.   Yes Historical Provider, MD  Multiple Vitamins-Minerals (PRESERVISION AREDS 2 PO) Take 1 tablet by mouth 2 (two) times daily.    Yes Historical Provider, MD  Omega-3 Fatty Acids (FISH OIL PO) Take by mouth 2 (two) times daily.   Yes Historical Provider, MD  Vitamin D, Ergocalciferol, (DRISDOL) 50000 UNITS CAPS Take 50,000 Units by mouth every 7 (seven) days. On Fridays   Yes Historical Provider,  MD  donepezil (ARICEPT) 10 MG tablet Take 1 tablet (10 mg total) by mouth at bedtime. Patient taking differently: Take 5 mg by mouth 2 (two) times daily.  10/24/14   Kathrynn Ducking, MD   Allergies  Allergen Reactions  . Lidocaine Anaphylaxis, Swelling, Rash and Other (See Comments)    Any of the " [place] "    FAMILY HISTORY:  family history includes Cancer in her mother; Dementia in her brother; Heart disease in her father;  Hypertension in her father. SOCIAL HISTORY:  reports that she quit smoking about 20 years ago. She has never used smokeless tobacco. She reports that she does not drink alcohol or use illicit drugs.  REVIEW OF SYSTEMS Bolds are positive    Constitutional: Negative for fever, chills, weight loss, malaise/fatigue and diaphoresis.  HENT: Negative for hearing loss, ear pain, nosebleeds, congestion, sore throat, neck pain, tinnitus and ear discharge.  R>L sinus pressure  Eyes: Negative for blurred vision, double vision, photophobia, pain, discharge and redness.  Respiratory: cough wet, congested, but not productive, no hemoptysis or sputum production, shortness of breath, wheezing and stridor.   Cardiovascular: Negative for chest pain, palpitations, orthopnea, claudication, leg swelling and PND.  Gastrointestinal: Negative for heartburn, nausea, vomiting, abdominal pain, diarrhea, constipation, blood in stool and melena.  Genitourinary: Negative for dysuria, urgency, frequency, hematuria and flank pain.  Musculoskeletal: Negative for myalgias, back pain, joint pain and falls.  Skin: Negative for itching and rash.  Neurological: Negative for dizziness, tingling, tremors, sensory change, speech change, focal weakness, seizures, loss of consciousness, weakness and headaches.  Endo/Heme/Allergies: Negative for environmental allergies and polydipsia. Does not bruise/bleed easily.  SUBJECTIVE:  Sob w/ exertion. Has heart burn currently.   VITAL SIGNS: Temp:  [98.2 F (36.8 C)-98.4 F (36.9 C)] 98.2 F (36.8 C) (01/05 0553) Pulse Rate:  [73-94] 73 (01/05 0553) Resp:  [16-19] 16 (01/05 0553) BP: (153-189)/(72-94) 158/76 mmHg (01/05 0553) SpO2:  [94 %-98 %] 98 % (01/05 0553) Weight:  [81.194 kg (179 lb)] 81.194 kg (179 lb) (01/05 0550)  PHYSICAL EXAMINATION: General:  79 year old female, currently in no acute distress.  Neuro:  Awake, has some memory deficits but is easily re-oriented.  HEENT:   [place], no JVD , no thrush, marked UAW wheeze  Cardiovascular:  rrr Lungs:  Marked upper airway wheeze, this transmits t/o the thorax. Possible true wheeze. + rales posterior. No accessory muscle use  Abdomen:  NT, no distention  Musculoskeletal:  Intact w/ good strength  Skin:  Diffuse edema, + ecchymosis    Recent Labs Lab 12/03/14 0500 12/04/14 0437 12/05/14 0505  NA 126* 132* 136  K 4.5 4.2 4.1  CL 95* 100 102  CO2 27 26 28   BUN 19 18 17   CREATININE 0.82 0.67 0.70  GLUCOSE 152* 147* 101*    Recent Labs Lab 12/02/14 0433 12/03/14 0500 12/04/14 0437  HGB 12.6 12.4 12.3  HCT 38.2 39.2 40.0  WBC 12.0* 11.4* 12.4*  PLT 256 298 306   Dg Chest Port 1 View  12/05/2014   CLINICAL DATA:  Cough  EXAM: PORTABLE CHEST - 1 VIEW  COMPARISON:  12/02/2014  FINDINGS: Patchy infiltrate right upper lobe unchanged. Slight improvement left upper lobe infiltrate. Bibasilar infiltrates unchanged. Small bilateral effusions.  IMPRESSION: Bilateral infiltrates are similar to the prior study with mild improvement left upper lobe. Findings suspicious for pneumonia versus heart failure.   Electronically Signed   By: Franchot Gallo M.D.  On: 12/05/2014 09:32    ASSESSMENT / PLAN:   Diffuse bilateral pulmonary infiltrates: Suspect CAP vs viral pneumonitis (favor) +/- element of volume overload at this pt as she is > 15 lbs up since admit  Possible Sinusitis Cough w/ laryngopharyngeal reflux   GERD Dyspnea  Memory disorder.   Discussion: Suspect that this was CAP vs viral URI/pneumonitits w/ on-going rhinitis w/ post-nasal gtt. Most of her symptom burden at this point seems to be stemming from her upper airway. She has Marked upper airway wheeze w/ classic sounding upper airway cough. She also endorses active GERD now. Think that this is playing more of a role in her dyspnea than her pulmonary infiltrates. Not sure at this point how much of her infiltrates may be volume excess as well given she is 15  lbs up. Agree w/ IM decision to diurese her. Think that focusing treatment on upper airway should result in significant decrease in her symptoms.   Plan/rec: 1) agree w/ lasix 2) Wean FIO2 3) aggressively wean steroids, I do not think that these are helping much 4) check PCT and RVP 5) would commit to 7-10 d levaquin  6) cont BDs 7) add aggressive nasal Hygiene regimen (Nasal saline-->afrin-->flonase), also add antihistamine  8) add BID PPI 9) add flutter valve  10) f/u PCXR am 1/6   Erick Colace ACNP-BC [place] Pager # [phone] OR # [phone] if no answer 12/05/2014, 12:34 PM  Attending Note:  I have examined patient, reviewed labs, studies and notes. I have discussed the case with Jerrye Bushy and I agree with the data and plans as amended above.   Baltazar Apo, MD, PhD 12/06/2014, 9:24 AM [facility] [phone] or if no answer [phone]

## 2014-12-06 ENCOUNTER — Inpatient Hospital Stay (HOSPITAL_COMMUNITY): Payer: Medicare Other

## 2014-12-06 LAB — CBC
HCT: 41.3 % (ref 36.0–46.0)
HEMOGLOBIN: 12.8 g/dL (ref 12.0–15.0)
MCH: 27.8 pg (ref 26.0–34.0)
MCHC: 31 g/dL (ref 30.0–36.0)
MCV: 89.8 fL (ref 78.0–100.0)
PLATELETS: 305 10*3/uL (ref 150–400)
RBC: 4.6 MIL/uL (ref 3.87–5.11)
RDW: 14.3 % (ref 11.5–15.5)
WBC: 11.6 10*3/uL — AB (ref 4.0–10.5)

## 2014-12-06 LAB — RESPIRATORY VIRUS PANEL
Adenovirus: NOT DETECTED
INFLUENZA A H3: NOT DETECTED
Influenza A H1: NOT DETECTED
Influenza A: NOT DETECTED
Influenza B: NOT DETECTED
METAPNEUMOVIRUS: NOT DETECTED
PARAINFLUENZA 1 A: NOT DETECTED
PARAINFLUENZA 3 A: NOT DETECTED
Parainfluenza 2: NOT DETECTED
RESPIRATORY SYNCYTIAL VIRUS B: NOT DETECTED
RHINOVIRUS: NOT DETECTED
Respiratory Syncytial Virus A: NOT DETECTED

## 2014-12-06 LAB — GLUCOSE, CAPILLARY
GLUCOSE-CAPILLARY: 79 mg/dL (ref 70–99)
GLUCOSE-CAPILLARY: 108 mg/dL — AB (ref 70–99)
Glucose-Capillary: 126 mg/dL — ABNORMAL HIGH (ref 70–99)

## 2014-12-06 LAB — BASIC METABOLIC PANEL
Anion gap: 6 (ref 5–15)
BUN: 19 mg/dL (ref 6–23)
CALCIUM: 8.2 mg/dL — AB (ref 8.4–10.5)
CO2: 35 mmol/L — ABNORMAL HIGH (ref 19–32)
Chloride: 96 mEq/L (ref 96–112)
Creatinine, Ser: 0.83 mg/dL (ref 0.50–1.10)
GFR calc Af Amer: 77 mL/min — ABNORMAL LOW (ref >90)
GFR calc non Af Amer: 67 mL/min — ABNORMAL LOW (ref >90)
GLUCOSE: 115 mg/dL — AB (ref 70–99)
Potassium: 3.7 mmol/L (ref 3.5–5.1)
SODIUM: 137 mmol/L (ref 135–145)

## 2014-12-06 MED ORDER — POLYETHYLENE GLYCOL 3350 17 G PO PACK
17.0000 g | PACK | Freq: Every day | ORAL | Status: DC
  Administered 2014-12-06 – 2014-12-07 (×2): 17 g via ORAL
  Filled 2014-12-06 (×4): qty 1

## 2014-12-06 NOTE — Progress Notes (Signed)
Name: Virginia [name] MRN: [id number] DOB: Nov 14, 1936    ADMISSION DATE:  11/30/2014 CONSULTATION DATE:  1/5  REFERRING MD :  Mary[place] Pink  CHIEF COMPLAINT:  Dyspnea and pulmonary infiltrates   BRIEF PATIENT DESCRIPTION:  79 year old female admitted on 12/31 w/ working dx of CAP (diffuse pulm infiltrates of CXR) and wheeze. Treated in usual fashion w/ ABX, O2, BDs, and even systemic steroids. Comparison fim on 1/5 from admit show persistent R>L patchy infiltrates but had improved some. PCCM asked to assess on 1/5 as Wheeze and dyspnea persist in spite of above therapies.   >of note wt up 15 lbs from admit on 1/5  SIGNIFICANT EVENTS    STUDIES:  RVP 1/5>>> PCT 1/5>>>neg   SUBJECTIVE:  Feels much better  VITAL SIGNS: Temp:  [98 F (36.7 C)-98.1 F (36.7 C)] 98.1 F (36.7 C) (01/06 0459) Pulse Rate:  [71-78] 71 (01/06 0459) Resp:  [16] 16 (01/06 0459) BP: (130-144)/(61-69) 141/69 mmHg (01/06 0459) SpO2:  [90 %-97 %] 97 % (01/06 0459) 2 liters  PHYSICAL EXAMINATION: General:  79 year old female, currently in no acute distress.  Neuro:  Awake, has some memory deficits but is easily re-oriented.  HEENT:  [place], no JVD , no thrush, upper airway wheeze is essentially gone Cardiovascular:  rrr Lungs: faint bibasilar rales posterior. No accessory muscle use  Abdomen:  NT, no distention  Musculoskeletal:  Intact w/ good strength  Skin:  Diffuse edema, + ecchymosis    Recent Labs Lab 12/04/14 0437 12/05/14 0505 12/06/14 0540  NA 132* 136 137  K 4.2 4.1 3.7  CL 100 102 96  CO2 26 28 35*  BUN 18 17 19   CREATININE 0.67 0.70 0.83  GLUCOSE 147* 101* 115*    Recent Labs Lab 12/03/14 0500 12/04/14 0437 12/06/14 0540  HGB 12.4 12.3 12.8  HCT 39.2 40.0 41.3  WBC 11.4* 12.4* 11.6*  PLT 298 306 305   Dg Chest Port 1 View  12/06/2014   CLINICAL DATA:  Clinical pneumonia  EXAM: PORTABLE CHEST - 1 VIEW  COMPARISON:  Portable chest x-ray of December 05, 2014 and December 02, 2014.   FINDINGS: The lungs are mildly hyperinflated. There are patchy airspace opacities in both lungs greater on the right than on the left. These are less conspicuous today. The heart is normal in size. The pulmonary vascularity is not engorged. The mediastinum is normal in width. No significant pleural effusion is visible today but the tips of the costophrenic angles are excluded from the study. There are chronic degenerative changes of the right shoulder.  IMPRESSION: COPD with superimposed pneumonia. There has been slight interval improvement in the bilateral airspace opacities since 02 December 2014.   Electronically Signed   By: David  Martinique   On: 12/06/2014 07:24   Dg Chest Port 1 View  12/05/2014   CLINICAL DATA:  Cough  EXAM: PORTABLE CHEST - 1 VIEW  COMPARISON:  12/02/2014  FINDINGS: Patchy infiltrate right upper lobe unchanged. Slight improvement left upper lobe infiltrate. Bibasilar infiltrates unchanged. Small bilateral effusions.  IMPRESSION: Bilateral infiltrates are similar to the prior study with mild improvement left upper lobe. Findings suspicious for pneumonia versus heart failure.   Electronically Signed   By: Franchot Gallo M.D.   On: 12/05/2014 09:32  CXR personally reviewed on 1/6. Stable w/ no sig change when c/w 1/5  ASSESSMENT / PLAN:   Diffuse bilateral pulmonary infiltrates: Suspect CAP vs viral pneumonitis (favor) +/- element of volume  overload at this pt as she is > 15 lbs up since admit  Possible Sinusitis Cough w/ laryngopharyngeal reflux   GERD Dyspnea  Memory disorder.   Discussion: Suspect that this was CAP vs viral URI/pneumonitits w/ on-going rhinitis w/ post-nasal gtt. Most of her symptom burden seemed be stemming from her upper airway. She has Marked upper airway wheeze w/ classic sounding upper airway cough and GERD-->this has improved w/ mix of nasal hygiene, GERD therapy and antihistamines. Her CXR is stable and likely reflects residual viral pneumonitis.    Plan/rec: 1) would cont lasix as BP, BUN and creatinine will tolerate 2) Wean FIO2 3) aggressively wean steroids, I do not think that these are helping much 4) f/u RVP 5) complete 7 d total Levaquin as PCT is negative, no need to extend to 10 6) cont BDs 7) add aggressive nasal Hygiene regimen (Nasal saline-->afrin-->flonase), also added antihistamine  8) added BID PPI, could change this to daily at d/c 9) added flutter valve  10) assuming her symptom burden is unchanged could be d/c on 1/7   Erick Colace ACNP-BC [place] Pager # [phone] OR # [phone] if no answer 12/06/2014, 9:23 AM   Attending Note:  I have examined patient, reviewed labs, studies and notes. I have discussed the case with Jerrye Bushy and I agree with the data and plans as amended above.   Baltazar Apo, MD, PhD 12/06/2014, 1:07 PM [facility] [phone] or if no answer [phone]

## 2014-12-06 NOTE — Progress Notes (Signed)
OT Cancellation Note  Patient Details Name: Virginia [name] MRN: [id number] DOB: 1936/01/04   Cancelled Treatment:    Reason Eval/Treat Not Completed: Other (comment).  Pt just worked with PT and is fatiqued.  Will check another time.  Thresea Doble 12/06/2014, 1:55 PM  Lesle Chris, OTR/L [phone] 12/06/2014

## 2014-12-06 NOTE — Progress Notes (Signed)
CSW continuing to follow.  CSW received notification from MD that pt not yet medically ready for discharge today.   CSW contacted [facility] and updated facility.   CSW to continue to follow to provide support and assist with pt disposition needs.   Alison Murray, MSW, [place] [facility] [phone]

## 2014-12-06 NOTE — Progress Notes (Signed)
[facility] PROGRESS NOTE  Virginia [name] XIP:[id number] DOB: December 12, 1935 DOA: 11/30/2014 PCP: Tivis Ringer, MD  Assessment/Plan: Community-acquired pneumonia with wheezing Continue with current nebs and steroid taper. Pulmonary was consulted. Appreciate recs. Pt does feel much better today  Suspected acutely decompensated CHF, unknown if systolic or diastolic Pt noted to have 15lb wt gain in the past several weeks. Pt admits to drinking ample amounts of water at home. Elevated BNP recently with improvement noted with lasix. 2d echo is pending. Continue to diurese for now.  Nausea with abnormal LFTs. Symptomatically improved. Ultrasound of the abdomen does not show any concerning intra-abdominal finding. No gallstones were noted. CBD was normal size. Nausea, likely due to respiratory issues. LFTs are slightly elevated but stable. This can be followed up as an outpatient.  Hyponatremia Sodium level is improved. Hyponatremia, most likely due to vol overload. Cont to follow.  Memory disorder Continue with Aricept. Probably contributed to her episode of confusion the other night. No confusion the last 3 days. Reorient daily. Mobilize. She was seen by speech therapist and is on an appropriate diet.  History of essential hypertension. Blood pressure was somewhat inadequately controlled. The dose of amlodipine and ARB was increased recently. Continue to monitor for now.   Code Status: Full Family Communication: Pt in room (indicate person spoken with, relationship, and if by phone, the number) Disposition Plan: Pending   Consultants:  Pulmonary  Procedures:    Antibiotics: Levaquin 1/4>>>  HPI/Subjective: Feels much better today  Objective: Filed Vitals:   12/05/14 2211 12/06/14 0459 12/06/14 0949 12/06/14 1321  BP: 144/61 141/69 127/50 130/53  Pulse: 78 71 71 76  Temp: 98 F (36.7 C) 98.1 F (36.7 C)  97.7 F (36.5 C)  TempSrc: Oral Oral  Oral  Resp: 16 16  18    Height:      Weight:      SpO2: 94% 97%  95%    Intake/Output Summary (Last 24 hours) at 12/06/14 1921 Last data filed at 12/06/14 1815  Gross per 24 hour  Intake   1290 ml  Output   1950 ml  Net   -660 ml   Filed Weights   11/30/14 1209 12/05/14 0550  Weight: 74.39 kg (164 lb) 81.194 kg (179 lb)    Exam:   General:  Awake, in nad  Cardiovascular: regular, s1, s2  Respiratory: normal resp effort, end-expiratory wheezing  Abdomen: soft,nondistended  Musculoskeletal: perfused, no clubbing   Data Reviewed: Basic Metabolic Panel:  Recent Labs Lab 12/02/14 0400 12/03/14 0500 12/04/14 0437 12/05/14 0505 12/06/14 0540  NA 130* 126* 132* 136 137  K 4.8 4.5 4.2 4.1 3.7  CL 95* 95* 100 102 96  CO2 26 27 26 28  35*  GLUCOSE 147* 152* 147* 101* 115*  BUN 12 19 18 17 19   CREATININE 0.67 0.82 0.67 0.70 0.83  CALCIUM 8.4 8.2* 8.2* 8.3* 8.2*   Liver Function Tests:  Recent Labs Lab 11/30/14 1350 12/02/14 0436 12/03/14 0500  AST 46* 41* 30  ALT 33 65* 53*  ALKPHOS 64 86 73  BILITOT 1.5* 0.9 0.7  PROT 6.9 6.2 6.0  ALBUMIN 3.1* 2.8* 2.9*   No results for input(s): LIPASE, AMYLASE in the last 168 hours. No results for input(s): AMMONIA in the last 168 hours. CBC:  Recent Labs Lab 11/30/14 1350 12/02/14 0433 12/03/14 0500 12/04/14 0437 12/06/14 0540  WBC 9.2 12.0* 11.4* 12.4* 11.6*  HGB 13.4 12.6 12.4 12.3 12.8  HCT 40.7 38.2 39.2 40.0  41.3  MCV 87.0 86.4 89.5 90.9 89.8  PLT 238 256 298 306 305   Cardiac Enzymes: No results for input(s): CKTOTAL, CKMB, CKMBINDEX, TROPONINI in the last 168 hours. BNP (last 3 results) No results for input(s): PROBNP in the last 8760 hours. CBG:  Recent Labs Lab 12/05/14 1206 12/05/14 1706 12/06/14 0747 12/06/14 1134 12/06/14 1656  GLUCAP 117* 152* 79 108* 126*    No results found for this or any previous visit (from the past 240 hour(s)).   Studies: Dg Chest Port 1 View  12/06/2014   CLINICAL DATA:   Clinical pneumonia  EXAM: PORTABLE CHEST - 1 VIEW  COMPARISON:  Portable chest x-ray of December 05, 2014 and December 02, 2014.  FINDINGS: The lungs are mildly hyperinflated. There are patchy airspace opacities in both lungs greater on the right than on the left. These are less conspicuous today. The heart is normal in size. The pulmonary vascularity is not engorged. The mediastinum is normal in width. No significant pleural effusion is visible today but the tips of the costophrenic angles are excluded from the study. There are chronic degenerative changes of the right shoulder.  IMPRESSION: COPD with superimposed pneumonia. There has been slight interval improvement in the bilateral airspace opacities since 02 December 2014.   Electronically Signed   By: David  Martinique   On: 12/06/2014 07:24   Dg Chest Port 1 View  12/05/2014   CLINICAL DATA:  Cough  EXAM: PORTABLE CHEST - 1 VIEW  COMPARISON:  12/02/2014  FINDINGS: Patchy infiltrate right upper lobe unchanged. Slight improvement left upper lobe infiltrate. Bibasilar infiltrates unchanged. Small bilateral effusions.  IMPRESSION: Bilateral infiltrates are similar to the prior study with mild improvement left upper lobe. Findings suspicious for pneumonia versus heart failure.   Electronically Signed   By: Franchot Gallo M.D.   On: 12/05/2014 09:32    Scheduled Meds: . amLODipine  5 mg Oral Daily  . aspirin EC  81 mg Oral Daily  . B-complex with vitamin C  1 tablet Oral Daily  . chlorpheniramine-HYDROcodone  5 mL Oral Q12H  . colesevelam  1,875 mg Oral BID WC  . donepezil  5 mg Oral BID  . enoxaparin (LOVENOX) injection  40 mg Subcutaneous Q24H  . sodium chloride  2 spray Each Nare QID   And  . oxymetazoline  2 spray Each Nare BID   And  . fluticasone  2 spray Each Nare BID  . insulin aspart  0-9 Units Subcutaneous TID WC  . levofloxacin  750 mg Oral Daily  . loratadine  10 mg Oral Daily  . losartan  50 mg Oral Daily  . multivitamin  1 tablet Oral Daily   . multivitamin with minerals  1 tablet Oral Daily  . nebivolol  10 mg Oral Daily  . omega-3 acid ethyl esters  1 g Oral Daily  . pantoprazole  40 mg Oral BID  . polyethylene glycol  17 g Oral Daily  . predniSONE  60 mg Oral Q breakfast  . saccharomyces boulardii  250 mg Oral BID  . Vitamin D (Ergocalciferol)  50,000 Units Oral Q7 days   Continuous Infusions:   Principal Problem:   CAP (community acquired pneumonia) Active Problems:   Memory disorder   Nausea and vomiting   Hyponatremia   Essential hypertension  Time spent: 43min  Gerlene Glassburn, [facility] Pager [phone]. If 7PM-7AM, please contact night-coverage at [email or web address], password [facility] 12/06/2014, 7:21 PM  LOS: 6 days

## 2014-12-06 NOTE — Progress Notes (Signed)
Physical Therapy Treatment Patient Details Name: Virginia [name] MRN: [id number] DOB: 10-Feb-1936 Today's Date: 12/06/2014    History of Present Illness 79 y.o. female admitted with CAP.     PT Comments    *Noted pt had worsening wheezing yesterday. She reports she's very tired today. Pt agreed to get up to recliner but declined ambulation. Min A to pivot to [facility] and then to recliner. **  Follow Up Recommendations  SNF     Equipment Recommendations  None recommended by PT    Recommendations for Other Services       Precautions / Restrictions Precautions Precautions: Fall Precaution Comments: monitor O2 Restrictions Weight Bearing Restrictions: No    Mobility  Bed Mobility Overal bed mobility: Needs Assistance Bed Mobility: Supine to Sit     Supine to sit: Min assist     General bed mobility comments: min A to raise trunk  Transfers Overall transfer level: Needs assistance Equipment used: None Transfers: Sit to/from [facility] Sit to Stand: Min assist Stand pivot transfers: Min assist       General transfer comment: min A to rise and steady, cues for hand placement  Ambulation/Gait             General Gait Details: pt declined ambulation, stated she's too tired   Financial trader Rankin (Stroke Patients Only)       Balance                                    Cognition Arousal/Alertness: Awake/alert Behavior During Therapy: WFL for tasks assessed/performed Overall Cognitive Status: History of cognitive impairments - at baseline       Memory: Decreased short-term memory              Exercises      General Comments        Pertinent Vitals/Pain Pain Assessment: No/denies pain    Home Living                      Prior Function            PT Goals (current goals can now be found in the care plan section) Acute Rehab PT Goals Patient Stated Goal: to  get stronger PT Goal Formulation: With patient/family Time For Goal Achievement: 12/15/14 Potential to Achieve Goals: Good Progress towards PT goals: Not progressing toward goals - comment (fatigue limited progress today, pt had medical decline yesterday)    Frequency  Min 3X/week    PT Plan Current plan remains appropriate    Co-evaluation             End of Session Equipment Utilized During Treatment: Gait belt;Oxygen Activity Tolerance: Patient tolerated treatment well Patient left: in chair;with call bell/phone within reach;with family/visitor present (sitting at bedside with family), chair alarm set     Time: 0037-0488 PT Time Calculation (min) (ACUTE ONLY): 14 min  Charges:  $Therapeutic Activity: 8-22 mins                    G Codes:      Philomena Doheny 12/06/2014, 1:41 PM [phone]

## 2014-12-06 NOTE — Care Management Note (Signed)
Medicare Important Message given?  YES (If response is "NO", the following Medicare IM given date fields will be blank) Date Medicare IM given:  12/06/2014 Medicare IM given by:  Marney Doctor

## 2014-12-07 ENCOUNTER — Telehealth: Payer: Self-pay | Admitting: Internal Medicine

## 2014-12-07 DIAGNOSIS — I509 Heart failure, unspecified: Secondary | ICD-10-CM

## 2014-12-07 DIAGNOSIS — I10 Essential (primary) hypertension: Secondary | ICD-10-CM

## 2014-12-07 DIAGNOSIS — R0600 Dyspnea, unspecified: Secondary | ICD-10-CM | POA: Insufficient documentation

## 2014-12-07 LAB — COMPREHENSIVE METABOLIC PANEL
ALT: 26 U/L (ref 0–35)
AST: 19 U/L (ref 0–37)
Albumin: 2.5 g/dL — ABNORMAL LOW (ref 3.5–5.2)
Alkaline Phosphatase: 44 U/L (ref 39–117)
Anion gap: 6 (ref 5–15)
BILIRUBIN TOTAL: 0.8 mg/dL (ref 0.3–1.2)
BUN: 17 mg/dL (ref 6–23)
CALCIUM: 8.2 mg/dL — AB (ref 8.4–10.5)
CHLORIDE: 95 meq/L — AB (ref 96–112)
CO2: 35 mmol/L — ABNORMAL HIGH (ref 19–32)
CREATININE: 0.76 mg/dL (ref 0.50–1.10)
GFR calc non Af Amer: 80 mL/min — ABNORMAL LOW (ref >90)
Glucose, Bld: 104 mg/dL — ABNORMAL HIGH (ref 70–99)
Potassium: 4.3 mmol/L (ref 3.5–5.1)
SODIUM: 136 mmol/L (ref 135–145)
Total Protein: 5.4 g/dL — ABNORMAL LOW (ref 6.0–8.3)

## 2014-12-07 LAB — PROCALCITONIN

## 2014-12-07 LAB — CBC
HCT: 41.4 % (ref 36.0–46.0)
HEMOGLOBIN: 12.5 g/dL (ref 12.0–15.0)
MCH: 27.8 pg (ref 26.0–34.0)
MCHC: 30.2 g/dL (ref 30.0–36.0)
MCV: 92 fL (ref 78.0–100.0)
Platelets: 315 10*3/uL (ref 150–400)
RBC: 4.5 MIL/uL (ref 3.87–5.11)
RDW: 14.3 % (ref 11.5–15.5)
WBC: 12.8 10*3/uL — AB (ref 4.0–10.5)

## 2014-12-07 LAB — GLUCOSE, CAPILLARY
GLUCOSE-CAPILLARY: 94 mg/dL (ref 70–99)
Glucose-Capillary: 108 mg/dL — ABNORMAL HIGH (ref 70–99)
Glucose-Capillary: 157 mg/dL — ABNORMAL HIGH (ref 70–99)

## 2014-12-07 LAB — TSH: TSH: 0.946 u[IU]/mL (ref 0.350–4.500)

## 2014-12-07 MED ORDER — PREDNISONE 20 MG PO TABS
30.0000 mg | ORAL_TABLET | Freq: Every day | ORAL | Status: DC
  Administered 2014-12-07 – 2014-12-09 (×3): 30 mg via ORAL
  Filled 2014-12-07 (×4): qty 1

## 2014-12-07 MED ORDER — FUROSEMIDE 10 MG/ML IJ SOLN
60.0000 mg | Freq: Every day | INTRAMUSCULAR | Status: DC
  Administered 2014-12-07 – 2014-12-09 (×3): 60 mg via INTRAVENOUS
  Filled 2014-12-07 (×3): qty 6

## 2014-12-07 MED ORDER — ENSURE PUDDING PO PUDG
1.0000 | Freq: Three times a day (TID) | ORAL | Status: DC
  Administered 2014-12-08 – 2014-12-09 (×4): 1 via ORAL
  Filled 2014-12-07 (×7): qty 1

## 2014-12-07 MED ORDER — ENSURE COMPLETE PO LIQD
237.0000 mL | Freq: Two times a day (BID) | ORAL | Status: DC
  Administered 2014-12-07 – 2014-12-09 (×3): 237 mL via ORAL

## 2014-12-07 NOTE — Progress Notes (Signed)
Occupational Therapy Treatment Patient Details Name: MARIJOSE CURINGTON MRN: [id number] DOB: June 24, 1936 Today's Date: 12/07/2014    History of present illness 79 y.o. female admitted with CAP.    OT comments  Pt participated well in OT ADL session.  Cues for thoroughness and rest breaks for energy conservation  Follow Up Recommendations  SNF    Equipment Recommendations  3 in 1 bedside comode    Recommendations for Other Services      Precautions / Restrictions Precautions Precautions: Fall Precaution Comments: monitor O2       Mobility Bed Mobility           Sit to supine: Min assist   General bed mobility comments: assist for LEs  Transfers   Equipment used: Rolling walker (2 wheeled) Transfers: Sit to/from [facility] Sit to Stand: Min assist Stand pivot transfers: Min assist       General transfer comment: assist to rise and steady; cues for UE placement    Balance                                   ADL       Grooming: Oral care;Set up;Sitting (cues to spit)   Upper Body Bathing: Set up;Sitting (cues for thoroughness)   Lower Body Bathing: Moderate assistance;Sit to/from stand   Upper Body Dressing : Minimal assistance;Sitting (to tie gown, manage lines)       Toilet Transfer: Minimal assistance;BSC;Stand-pivot   Toileting- Clothing Manipulation and Hygiene: Moderate assistance;Sit to/from stand         General ADL Comments: pt performed ADL from commode and transferred back to bed.  Cues for thoroughness.  Daughter present and assisted      Vision                     Perception     Praxis      Cognition   Behavior During Therapy: WFL for tasks assessed/performed Overall Cognitive Status: History of cognitive impairments - at baseline       Memory: Decreased short-term memory               Extremity/Trunk Assessment               Exercises     Shoulder Instructions        General Comments      Pertinent Vitals/ Pain       Pain Assessment: No/denies pain  Home Living                                          Prior Functioning/Environment              Frequency Min 2X/week     Progress Toward Goals  OT Goals(current goals can now be found in the care plan section)  Progress towards OT goals: Progressing toward goals     Plan      Co-evaluation                 End of Session     Activity Tolerance Patient tolerated treatment well   Patient Left in bed;with call bell/phone within reach;with bed alarm set;with family/visitor present   Nurse Communication          Time: 4098-1191 OT Time Calculation (min): 31 min  Charges: OT General Charges $OT Visit: 1 Procedure OT Treatments $Self Care/Home Management : 23-37 mins  Solon Alban 12/07/2014, 3:14 PM  Lesle Chris, OTR/L [phone] 12/07/2014

## 2014-12-07 NOTE — Telephone Encounter (Signed)
Per Wannetta Sender-- cardiology consult has not been requested on this patient and cardiology has not seen pt during this admission.  I spoke with patient's daughter, [name]. [name] states pt was admitted to [facility] by PCP 11/30/14 for SOB, pulmonary has been consulted at her insistence. [name] is concerned because pt's weight is up at least 10 pounds, pt was given Solu-Medrol during this hospitalization. [name] states an echo was ordered for pt but has not been done yet. [name] is aware that if cardiology consult is requested cardiology will be glad to see her mother.  [name] was on her way to [facility] to see patient now. I advised [name] to discuss her concerns with pt's nurse at hospital. I advised her to request to speak with patient's doctor so she can express her concerns to them.  [name] agreed and thanked me.

## 2014-12-07 NOTE — Progress Notes (Signed)
Physical Therapy Treatment Patient Details Name: Virginia [name] MRN: [id number] DOB: 08/07/1936 Today's Date: 12/07/2014    History of Present Illness 79 y.o. female admitted with CAP.     PT Comments    Pt tolerated session well today. C/o some bil LE pain at rest and with activity-rated as "not much" per pt. Encouraged pt and family to perform AP, QS, and GS exercises (10 reps each) as tolerated, in bed or chair.   Follow Up Recommendations  SNF     Equipment Recommendations  None recommended by PT    Recommendations for Other Services       Precautions / Restrictions Precautions Precautions: Fall Precaution Comments: monitor O2 Restrictions Weight Bearing Restrictions: No    Mobility  Bed Mobility Overal bed mobility: Needs Assistance Bed Mobility: Supine to Sit     Supine to sit: Min guard     General bed mobility comments: close guard for safety  Transfers Overall transfer level: Needs assistance Equipment used: Rolling walker (2 wheeled) Transfers: Sit to/from Stand Sit to Stand: Min assist         General transfer comment: min A to rise and steady, cues for hand placement  Ambulation/Gait Ambulation/Gait assistance: Min assist Ambulation Distance (Feet): 170 Feet (80'x1, 170'x1) Assistive device: Rolling walker (2 wheeled) Gait Pattern/deviations: Step-through pattern     General Gait Details: Assist to stabilize and maintain straight path with walker intermittently. 1 seated rest break needed due to fatigue. Remained on [place] O2-2L.    Stairs            Wheelchair Mobility    Modified Rankin (Stroke Patients Only)       Balance                                    Cognition Arousal/Alertness: Awake/alert Behavior During Therapy: WFL for tasks assessed/performed Overall Cognitive Status: History of cognitive impairments - at baseline       Memory: Decreased short-term memory              Exercises General  Exercises - Lower Extremity Ankle Circles/Pumps: AROM;Both;10 reps;Seated Quad Sets: AROM;Both;10 reps;Seated Long Arc Quad: AROM;Both;10 reps;Seated Hip ABduction/ADduction: AROM;Both;5 reps;Seated    General Comments        Pertinent Vitals/Pain Pain Assessment: Faces Faces Pain Scale: Hurts little more Pain Location: bil LE, L hip area Pain Descriptors / Indicators: Sore;Aching    Home Living                      Prior Function            PT Goals (current goals can now be found in the care plan section) Progress towards PT goals: Progressing toward goals    Frequency  Min 3X/week    PT Plan Current plan remains appropriate    Co-evaluation             End of Session Equipment Utilized During Treatment: Gait belt;Oxygen Activity Tolerance: Patient tolerated treatment well Patient left: in chair;with call bell/phone within reach;with family/visitor present     Time: 5885-0277 PT Time Calculation (min) (ACUTE ONLY): 24 min  Charges:  $Gait Training: 23-37 mins                    G Codes:      Weston Anna, MPT Pager: [phone]

## 2014-12-07 NOTE — Progress Notes (Signed)
  Echocardiogram 2D Echocardiogram has been performed.  Bobbye Charleston 12/07/2014, 2:25 PM

## 2014-12-07 NOTE — Progress Notes (Signed)
[facility] PROGRESS NOTE  Virginia [name] MVH:[id number] DOB: 10-Aug-1936 DOA: 11/30/2014 PCP: Virginia Ringer, MD  Assessment/Plan: Community-acquired pneumonia with wheezing Continue with current nebs and steroid taper. Pulmonary had been following. Appreciate recs. Pt does feel much better today  Suspected acutely decompensated CHF, unknown if systolic or diastolic Pt noted to have 15lb wt gain in the past several weeks prior to admit. Pt admits to drinking ample amounts of water at home. Elevated BNP recently into the 400's with improvement noted with lasix. 2d echo is pending. Pt continues to note improvement with diuresis. Recent outpt stress test neg. TSH normal. Will check free T4 at next blood draw.  Nausea with abnormal LFTs. Symptomatically improved. Ultrasound of the abdomen does not show any concerning intra-abdominal finding. No gallstones were noted. CBD was normal size. Nausea, likely due to respiratory issues. LFTs are slightly elevated but stable. This can be followed up as an outpatient.  Hyponatremia Sodium level has normalized. Hyponatremia, most likely due to vol overload. Cont to follow.  Memory disorder Continue with Aricept. Probably contributed to her episode of confusion the other night. No confusion the last 3 days. Reorient daily. Mobilize. She was seen by speech therapist and is on an appropriate diet.  History of essential hypertension. Blood pressure was somewhat inadequately controlled. The dose of amlodipine and ARB was increased recently. Continue to monitor for now.   Code Status: Full Family Communication: Pt in room, family at bedside Disposition Plan: Pending   Consultants:  Pulmonary  Procedures:    Antibiotics: Levaquin 1/4>>>  HPI/Subjective: Continues to feel better.  Objective: Filed Vitals:   12/06/14 2117 12/06/14 2134 12/07/14 0521 12/07/14 0900  BP: 151/64  153/79   Pulse: 72 72 70   Temp: 98.2 F (36.8 C)  98.3 F  (36.8 C)   TempSrc: Oral  Oral   Resp: 16  16   Height:      Weight:    79.833 kg (176 lb)  SpO2: 99%  99%     Intake/Output Summary (Last 24 hours) at 12/07/14 1437 Last data filed at 12/07/14 0800  Gross per 24 hour  Intake    770 ml  Output   1800 ml  Net  -1030 ml   Filed Weights   11/30/14 1209 12/05/14 0550 12/07/14 0900  Weight: 74.39 kg (164 lb) 81.194 kg (179 lb) 79.833 kg (176 lb)    Exam:   General:  Awake, in nad  Cardiovascular: regular, s1, s2  Respiratory: normal resp effort, end-expiratory wheezing improving  Abdomen: soft,nondistended  Musculoskeletal: perfused, no clubbing   Data Reviewed: Basic Metabolic Panel:  Recent Labs Lab 12/03/14 0500 12/04/14 0437 12/05/14 0505 12/06/14 0540 12/07/14 0550  NA 126* 132* 136 137 136  K 4.5 4.2 4.1 3.7 4.3  CL 95* 100 102 96 95*  CO2 27 26 28  35* 35*  GLUCOSE 152* 147* 101* 115* 104*  BUN 19 18 17 19 17   CREATININE 0.82 0.67 0.70 0.83 0.76  CALCIUM 8.2* 8.2* 8.3* 8.2* 8.2*   Liver Function Tests:  Recent Labs Lab 12/02/14 0436 12/03/14 0500 12/07/14 0550  AST 41* 30 19  ALT 65* 53* 26  ALKPHOS 86 73 44  BILITOT 0.9 0.7 0.8  PROT 6.2 6.0 5.4*  ALBUMIN 2.8* 2.9* 2.5*   No results for input(s): LIPASE, AMYLASE in the last 168 hours. No results for input(s): AMMONIA in the last 168 hours. CBC:  Recent Labs Lab 12/02/14 0433 12/03/14 0500 12/04/14 0437 12/06/14  0540 12/07/14 0550  WBC 12.0* 11.4* 12.4* 11.6* 12.8*  HGB 12.6 12.4 12.3 12.8 12.5  HCT 38.2 39.2 40.0 41.3 41.4  MCV 86.4 89.5 90.9 89.8 92.0  PLT 256 298 306 305 315   Cardiac Enzymes: No results for input(s): CKTOTAL, CKMB, CKMBINDEX, TROPONINI in the last 168 hours. BNP (last 3 results) No results for input(s): PROBNP in the last 8760 hours. CBG:  Recent Labs Lab 12/06/14 0747 12/06/14 1134 12/06/14 1656 12/07/14 0819 12/07/14 1151  GLUCAP 79 108* 126* 94 108*    Recent Results (from the past 240  hour(s))  Respiratory virus panel (routine influenza)     Status: None   Collection Time: 12/05/14  4:27 PM  Result Value Ref Range Status   Source - [facility] NASAL WASHINGS  Corrected    Comment: CORRECTED ON 01/06 AT 2003: PREVIOUSLY REPORTED AS NASAL WASHINGS   Respiratory Syncytial Virus A NOT DETECTED  Final   Respiratory Syncytial Virus B NOT DETECTED  Final   Influenza A NOT DETECTED  Final   Influenza B NOT DETECTED  Final   Parainfluenza 1 NOT DETECTED  Final   Parainfluenza 2 NOT DETECTED  Final   Parainfluenza 3 NOT DETECTED  Final   Metapneumovirus NOT DETECTED  Final   Rhinovirus NOT DETECTED  Final   Adenovirus NOT DETECTED  Final   Influenza A H1 NOT DETECTED  Final   Influenza A H3 NOT DETECTED  Final    Comment: (NOTE)       Normal Reference Range for each Analyte: NOT DETECTED Testing performed using the Luminex xTAG Respiratory Viral Panel test kit. The analytical performance characteristics of this assay have been determined by [employer].  The modifications have not been cleared or approved by the FDA. This assay has been validated pursuant to the CLIA regulations and is used for clinical purposes. Performed at [facility]      Studies: Dg Chest Port 1 View  12/06/2014   CLINICAL DATA:  Clinical pneumonia  EXAM: PORTABLE CHEST - 1 VIEW  COMPARISON:  Portable chest x-ray of December 05, 2014 and December 02, 2014.  FINDINGS: The lungs are mildly hyperinflated. There are patchy airspace opacities in both lungs greater on the right than on the left. These are less conspicuous today. The heart is normal in size. The pulmonary vascularity is not engorged. The mediastinum is normal in width. No significant pleural effusion is visible today but the tips of the costophrenic angles are excluded from the study. There are chronic degenerative changes of the right shoulder.  IMPRESSION: COPD with superimposed pneumonia. There has been slight interval improvement in  the bilateral airspace opacities since 02 December 2014.   Electronically Signed   By: Virginia  [name]   On: 12/06/2014 07:24    Scheduled Meds: . amLODipine  5 mg Oral Daily  . aspirin EC  81 mg Oral Daily  . B-complex with vitamin C  1 tablet Oral Daily  . chlorpheniramine-HYDROcodone  5 mL Oral Q12H  . colesevelam  1,875 mg Oral BID WC  . donepezil  5 mg Oral BID  . enoxaparin (LOVENOX) injection  40 mg Subcutaneous Q24H  . feeding supplement (ENSURE COMPLETE)  237 mL Oral BID BM  . sodium chloride  2 spray Each Nare QID   And  . oxymetazoline  2 spray Each Nare BID   And  . fluticasone  2 spray Each Nare BID  . furosemide  60 mg Intravenous Daily  .  insulin aspart  0-9 Units Subcutaneous TID WC  . levofloxacin  750 mg Oral Daily  . loratadine  10 mg Oral Daily  . losartan  50 mg Oral Daily  . multivitamin  1 tablet Oral Daily  . multivitamin with minerals  1 tablet Oral Daily  . nebivolol  10 mg Oral Daily  . omega-3 acid ethyl esters  1 g Oral Daily  . pantoprazole  40 mg Oral BID  . polyethylene glycol  17 g Oral Daily  . predniSONE  30 mg Oral Q breakfast  . saccharomyces boulardii  250 mg Oral BID  . Vitamin D (Ergocalciferol)  50,000 Units Oral Q7 days   Continuous Infusions:   Principal Problem:   CAP (community acquired pneumonia) Active Problems:   Memory disorder   Nausea and vomiting   Hyponatremia   Essential hypertension  Time spent: 32mn  Diontre Harps, [facility] Pager [phone] If 7PM-7AM, please contact night-coverage at [email or web address], password [email or web address]1/06/2015, 2:37 PM  LOS: 7 days

## 2014-12-07 NOTE — Progress Notes (Signed)
Name: Virginia [name] MRN: [id number] DOB: 1936-02-15    ADMISSION DATE:  11/30/2014 CONSULTATION DATE:  1/5  REFERRING MD :  [place] Pink  CHIEF COMPLAINT:  Dyspnea and pulmonary infiltrates   BRIEF PATIENT DESCRIPTION:  79 year old female admitted on 12/31 w/ working dx of CAP (diffuse pulm infiltrates of CXR) and wheeze. Treated in usual fashion w/ ABX, O2, BDs, and even systemic steroids. Comparison fim on 1/5 from admit show persistent R>L patchy infiltrates but had improved some. PCCM asked to assess on 1/5 as Wheeze and dyspnea persist in spite of above therapies.   >of note wt up 15 lbs from admit on 1/5  SIGNIFICANT EVENTS    STUDIES:  RVP 1/5>>> PCT 1/5>>>neg   SUBJECTIVE:  Continues to improve, still has some UA irritation, some sore throat  VITAL SIGNS: Temp:  [97.7 F (36.5 C)-98.3 F (36.8 C)] 98.3 F (36.8 C) (01/07 0521) Pulse Rate:  [70-76] 70 (01/07 0521) Resp:  [16-18] 16 (01/07 0521) BP: (127-153)/(50-79) 153/79 mmHg (01/07 0521) SpO2:  [95 %-99 %] 99 % (01/07 0521) 2 liters  PHYSICAL EXAMINATION: General:  79 year old female, currently in no acute distress.  Neuro:  Awake, has some memory deficits but is easily re-oriented.  HEENT:  [place], no JVD , no thrush, upper airway wheeze is essentially gone Cardiovascular:  rrr Lungs: faint bibasilar rales posterior. No accessory muscle use  Abdomen:  NT, no distention  Musculoskeletal:  Intact w/ good strength  Skin:  Diffuse edema, + ecchymosis    Recent Labs Lab 12/05/14 0505 12/06/14 0540 12/07/14 0550  NA 136 137 136  K 4.1 3.7 4.3  CL 102 96 95*  CO2 28 35* 35*  BUN 17 19 17   CREATININE 0.70 0.83 0.76  GLUCOSE 101* 115* 104*    Recent Labs Lab 12/04/14 0437 12/06/14 0540 12/07/14 0550  HGB 12.3 12.8 12.5  HCT 40.0 41.3 41.4  WBC 12.4* 11.6* 12.8*  PLT 306 305 315   Dg Chest Port 1 View  12/06/2014   CLINICAL DATA:  Clinical pneumonia  EXAM: PORTABLE CHEST - 1 VIEW  COMPARISON:   Portable chest x-ray of December 05, 2014 and December 02, 2014.  FINDINGS: The lungs are mildly hyperinflated. There are patchy airspace opacities in both lungs greater on the right than on the left. These are less conspicuous today. The heart is normal in size. The pulmonary vascularity is not engorged. The mediastinum is normal in width. No significant pleural effusion is visible today but the tips of the costophrenic angles are excluded from the study. There are chronic degenerative changes of the right shoulder.  IMPRESSION: COPD with superimposed pneumonia. There has been slight interval improvement in the bilateral airspace opacities since 02 December 2014.   Electronically Signed   By: David  Martinique   On: 12/06/2014 07:24   Dg Chest Port 1 View  12/05/2014   CLINICAL DATA:  Cough  EXAM: PORTABLE CHEST - 1 VIEW  COMPARISON:  12/02/2014  FINDINGS: Patchy infiltrate right upper lobe unchanged. Slight improvement left upper lobe infiltrate. Bibasilar infiltrates unchanged. Small bilateral effusions.  IMPRESSION: Bilateral infiltrates are similar to the prior study with mild improvement left upper lobe. Findings suspicious for pneumonia versus heart failure.   Electronically Signed   By: Franchot Gallo M.D.   On: 12/05/2014 09:32  CXR personally reviewed on 1/6. Stable w/ no sig change when c/w 1/5  ASSESSMENT / PLAN:   Diffuse bilateral pulmonary infiltrates: Suspect CAP  vs viral pneumonitis (favor) +/- element of volume overload at this pt as she is > 15 lbs up since admit  Possible Sinusitis Cough w/ laryngopharyngeal reflux   GERD Dyspnea  Memory disorder.   Discussion: Suspect that this was CAP vs viral URI/pneumonitits w/ on-going rhinitis w/ post-nasal gtt. Most of her symptom burden seemed be stemming from her upper airway. She has Marked upper airway wheeze w/ classic sounding upper airway cough and GERD-->this has improved w/ mix of nasal hygiene, GERD therapy and antihistamines. Her CXR is  stable and likely reflects residual viral pneumonitis.   Plan/rec: 1) would cont lasix as BP, BUN and creatinine will tolerate 2) Wean FIO2 3) aggressively wean steroids over next 1-2 days 4) complete 7 d total Levaquin as PCT is negative, no need to extend to 10 days 5) cont BDs 6) continue aggressive nasal Hygiene regimen (Nasal saline-->afrin-->flonase), also added antihistamine  8) added BID PPI, could change this to daily at d/c 9) added flutter valve  10) assuming her symptom burden is unchanged could be d/c on 1/7   PCCM will sign off. Please call if we can help you  Baltazar Apo, MD, PhD 12/07/2014, 7:17 AM [facility] [phone] or if no answer [phone]

## 2014-12-07 NOTE — Telephone Encounter (Signed)
New Msg       Pt daughter calling, pt in the hospital at [facility].   Daughter concerned about pt being released too early with heart concerns.   Please contact daughter Basilia Stuckert at [phone].

## 2014-12-08 ENCOUNTER — Telehealth: Payer: Self-pay | Admitting: Neurology

## 2014-12-08 DIAGNOSIS — I5031 Acute diastolic (congestive) heart failure: Secondary | ICD-10-CM

## 2014-12-08 DIAGNOSIS — I5032 Chronic diastolic (congestive) heart failure: Secondary | ICD-10-CM

## 2014-12-08 DIAGNOSIS — R0600 Dyspnea, unspecified: Secondary | ICD-10-CM

## 2014-12-08 LAB — BASIC METABOLIC PANEL
Anion gap: 9 (ref 5–15)
BUN: 23 mg/dL (ref 6–23)
CO2: 38 mmol/L — ABNORMAL HIGH (ref 19–32)
Calcium: 8.2 mg/dL — ABNORMAL LOW (ref 8.4–10.5)
Chloride: 88 mEq/L — ABNORMAL LOW (ref 96–112)
Creatinine, Ser: 0.8 mg/dL (ref 0.50–1.10)
GFR calc Af Amer: 81 mL/min — ABNORMAL LOW (ref >90)
GFR, EST NON AFRICAN AMERICAN: 70 mL/min — AB (ref >90)
Glucose, Bld: 95 mg/dL (ref 70–99)
Potassium: 4.1 mmol/L (ref 3.5–5.1)
Sodium: 135 mmol/L (ref 135–145)

## 2014-12-08 LAB — GLUCOSE, CAPILLARY
GLUCOSE-CAPILLARY: 173 mg/dL — AB (ref 70–99)
Glucose-Capillary: 82 mg/dL (ref 70–99)
Glucose-Capillary: 106 mg/dL — ABNORMAL HIGH (ref 70–99)

## 2014-12-08 LAB — T4, FREE: Free T4: 1.46 ng/dL (ref 0.80–1.80)

## 2014-12-08 NOTE — Progress Notes (Signed)
CSW continuing to follow for pt disposition needs.  CSW received notification from MD that pt not yet medically ready for discharge, but anticipate discharge tomorrow.   CSW contacted [facility] and confirmed that facility can accept pt back tomorrow. Weekend CSW to contacted [employer], Candice to facilitate pt discharge over weekend.   CSW met with pt and pt brother at bedside to discuss. CSW notified pt that [facility] can accept pt during the weekend if pt discharged during the weekend. CSW shared with pt that MD anticipates weekend discharge. Pt expressed understanding and relieved that she still has a bed available at [facility].   Weekend Education officer, museum to facilitate pt discharge needs during the weekend if pt medically ready.   Alison Murray, MSW, [facility] [place] [facility] [phone]

## 2014-12-08 NOTE — Progress Notes (Signed)
[facility] PROGRESS NOTE  Virginia [name] UDJ:[id number] DOB: 12-Aug-1936 DOA: 11/30/2014 PCP: Tivis Ringer, MD  Assessment/Plan: Community-acquired pneumonia with wheezing Continue with current nebs and steroid taper. Pulmonary had been following, since signed off. Appreciate recs. Pt does feel much better today  Acutely decompensated diastolic CHF, chronicity unclear Pt noted to have 15lb wt gain in the past several weeks prior to admit. Pt admits to drinking ample amounts of water at home. Elevated BNP noted into the 400's with symptomatic improvement noted with lasix. 2d echo with normal EF of 65-70% and grade 1 diastolic dysfunction. Pt continues to note good improvement with diuresis. Recent outpt stress test neg. TSH and free T4 normal.  Pre-admit weight of 74kg, admission wt of 81.19kg  Wt today: 75.7kg  Nausea with abnormal LFTs. Symptomatically improved. Ultrasound of the abdomen does not show any concerning intra-abdominal finding. No gallstones were noted. CBD was normal size. Nausea, likely due to respiratory issues. LFTs are slightly elevated but stable. This can be followed up as an outpatient.  Hyponatremia Sodium level has normalized. Hyponatremia, most likely due to vol overload. Cont to follow.  Memory disorder Continue with Aricept. Probably contributed to her episode of confusion the other night. No confusion the last 3 days. Reorient daily. Mobilize. She was seen by speech therapist and is on an appropriate diet.  History of essential hypertension. Blood pressure was somewhat inadequately controlled. The dose of amlodipine and ARB was increased recently. Continue to monitor for now.   Code Status: Full Family Communication: Pt in room, family at bedside Disposition Plan: Pending   Consultants:  Pulmonary  Procedures:    Antibiotics: Levaquin 1/4>>>  HPI/Subjective: Pt notes continued improvement with breathing and with  energy  Objective: Filed Vitals:   12/08/14 0658 12/08/14 0830 12/08/14 0851 12/08/14 0859  BP: 149/77     Pulse: 68     Temp: 98.1 F (36.7 C)     TempSrc: Oral     Resp: 16     Height:      Weight:   75.751 kg (167 lb)   SpO2: 97% 94% 87% 94%    Intake/Output Summary (Last 24 hours) at 12/08/14 1516 Last data filed at 12/08/14 1402  Gross per 24 hour  Intake    240 ml  Output   3600 ml  Net  -3360 ml   Filed Weights   12/05/14 0550 12/07/14 0900 12/08/14 0851  Weight: 81.194 kg (179 lb) 79.833 kg (176 lb) 75.751 kg (167 lb)    Exam:   General:  Awake, in nad  Cardiovascular: regular, s1, s2  Respiratory: normal resp effort, no wheezing  Abdomen: soft,nondistended  Musculoskeletal: perfused, no clubbing , B LE nonpitting edema  Data Reviewed: Basic Metabolic Panel:  Recent Labs Lab 12/04/14 0437 12/05/14 0505 12/06/14 0540 12/07/14 0550 12/08/14 0539  NA 132* 136 137 136 135  K 4.2 4.1 3.7 4.3 4.1  CL 100 102 96 95* 88*  CO2 26 28 35* 35* 38*  GLUCOSE 147* 101* 115* 104* 95  BUN _0 CREATININE 0.67 0.70 0.83 0.76 0.80  CALCIUM 8.2* 8.3* 8.2* 8.2* 8.2*   Liver Function Tests:  Recent Labs Lab 12/02/14 0436 12/03/14 0500 12/07/14 0550  AST 41* 30 19  ALT 65* 53* 26  ALKPHOS 86 73 44  BILITOT 0.9 0.7 0.8  PROT 6.2 6.0 5.4*  ALBUMIN 2.8* 2.9* 2.5*   No results for input(s): LIPASE, AMYLASE in the last 168  hours. No results for input(s): AMMONIA in the last 168 hours. CBC:  Recent Labs Lab 12/02/14 0433 12/03/14 0500 12/04/14 0437 12/06/14 0540 12/07/14 0550  WBC 12.0* 11.4* 12.4* 11.6* 12.8*  HGB 12.6 12.4 12.3 12.8 12.5  HCT 38.2 39.2 40.0 41.3 41.4  MCV 86.4 89.5 90.9 89.8 92.0  PLT 256 298 306 305 315   Cardiac Enzymes: No results for input(s): CKTOTAL, CKMB, CKMBINDEX, TROPONINI in the last 168 hours. BNP (last 3 results) No results for input(s): PROBNP in the last 8760 hours. CBG:  Recent Labs Lab  12/07/14 0819 12/07/14 1151 12/07/14 1706 12/08/14 0758 12/08/14 1124  GLUCAP 94 108* 157* 82 106*    Recent Results (from the past 240 hour(s))  Respiratory virus panel (routine influenza)     Status: None   Collection Time: 12/05/14  4:27 PM  Result Value Ref Range Status   Source - [facility] NASAL WASHINGS  Corrected    Comment: CORRECTED ON 01/06 AT 2003: PREVIOUSLY REPORTED AS NASAL WASHINGS   Respiratory Syncytial Virus A NOT DETECTED  Final   Respiratory Syncytial Virus B NOT DETECTED  Final   Influenza A NOT DETECTED  Final   Influenza B NOT DETECTED  Final   Parainfluenza 1 NOT DETECTED  Final   Parainfluenza 2 NOT DETECTED  Final   Parainfluenza 3 NOT DETECTED  Final   Metapneumovirus NOT DETECTED  Final   Rhinovirus NOT DETECTED  Final   Adenovirus NOT DETECTED  Final   Influenza A H1 NOT DETECTED  Final   Influenza A H3 NOT DETECTED  Final    Comment: (NOTE)       Normal Reference Range for each Analyte: NOT DETECTED Testing performed using the Luminex xTAG Respiratory Viral Panel test kit. The analytical performance characteristics of this assay have been determined by [employer].  The modifications have not been cleared or approved by the FDA. This assay has been validated pursuant to the CLIA regulations and is used for clinical purposes. Performed at [facility]      Studies: No results found.  Scheduled Meds: . amLODipine  5 mg Oral Daily  . aspirin EC  81 mg Oral Daily  . B-complex with vitamin C  1 tablet Oral Daily  . chlorpheniramine-HYDROcodone  5 mL Oral Q12H  . colesevelam  1,875 mg Oral BID WC  . donepezil  5 mg Oral BID  . enoxaparin (LOVENOX) injection  40 mg Subcutaneous Q24H  . feeding supplement (ENSURE COMPLETE)  237 mL Oral BID BM  . feeding supplement (ENSURE)  1 Container Oral TID WC  . sodium chloride  2 spray Each Nare QID   And  . oxymetazoline  2 spray Each Nare BID   And  . fluticasone  2 spray Each Nare BID   . furosemide  60 mg Intravenous Daily  . insulin aspart  0-9 Units Subcutaneous TID WC  . levofloxacin  750 mg Oral Daily  . loratadine  10 mg Oral Daily  . losartan  50 mg Oral Daily  . multivitamin  1 tablet Oral Daily  . multivitamin with minerals  1 tablet Oral Daily  . nebivolol  10 mg Oral Daily  . omega-3 acid ethyl esters  1 g Oral Daily  . pantoprazole  40 mg Oral BID  . polyethylene glycol  17 g Oral Daily  . predniSONE  30 mg Oral Q breakfast  . saccharomyces boulardii  250 mg Oral BID  . Vitamin D (  Ergocalciferol)  50,000 Units Oral Q7 days   Continuous Infusions:   Principal Problem:   CAP (community acquired pneumonia) Active Problems:   Memory disorder   Nausea and vomiting   Hyponatremia   Essential hypertension   Dyspnea   Congestive heart disease  Time spent: 49mn  Sintia Mckissic, SEdmontonHospitalists Pager [phone] If 7PM-7AM, please contact night-coverage at [email or web address], password [email or web address]1/07/2015, 3:16 PM  LOS: 8 days

## 2014-12-08 NOTE — Telephone Encounter (Signed)
I called the patient, and I talked with the daughter. The patient is in the hospital with pneumonia. I would not add namenda to the regimin until the patient is fully recovered. They are to call our office at that time.

## 2014-12-08 NOTE — Telephone Encounter (Signed)
Pt's daughter [name] is calling stating that after 30 days of being on donepezil (ARICEPT) tablet 5 mg Dr. Jannifer Franklin stated he may add another medication after that may be Namenda. She would like to go ahead and add that medication.  The pt is in the hospital right now and will have to go to Rehab when she gets out.  She is at [facility].  Her doctor there is Dr. Gerlene Burdock.  She thinks she may need to have speech pathologist while she is at the hospital.  Please call and advise.

## 2014-12-08 NOTE — Care Management Note (Signed)
Medicare Important Message given?  YES (If response is "NO", the following Medicare IM given date fields will be blank) Date Medicare IM given:  12/06/2014 Medicare IM given by:  Marney Doctor Date Additional Medicare IM given:  12/08/2014 Additional Medicare IM given by:  Alinda Sierras Lotoya Casella

## 2014-12-09 DIAGNOSIS — R488 Other symbolic dysfunctions: Secondary | ICD-10-CM | POA: Diagnosis not present

## 2014-12-09 DIAGNOSIS — J984 Other disorders of lung: Secondary | ICD-10-CM | POA: Diagnosis not present

## 2014-12-09 DIAGNOSIS — J449 Chronic obstructive pulmonary disease, unspecified: Secondary | ICD-10-CM | POA: Diagnosis not present

## 2014-12-09 DIAGNOSIS — F0390 Unspecified dementia without behavioral disturbance: Secondary | ICD-10-CM | POA: Diagnosis not present

## 2014-12-09 DIAGNOSIS — J9611 Chronic respiratory failure with hypoxia: Secondary | ICD-10-CM | POA: Diagnosis not present

## 2014-12-09 DIAGNOSIS — R413 Other amnesia: Secondary | ICD-10-CM | POA: Diagnosis not present

## 2014-12-09 DIAGNOSIS — Z9181 History of falling: Secondary | ICD-10-CM | POA: Diagnosis not present

## 2014-12-09 DIAGNOSIS — J189 Pneumonia, unspecified organism: Secondary | ICD-10-CM | POA: Diagnosis not present

## 2014-12-09 DIAGNOSIS — M6281 Muscle weakness (generalized): Secondary | ICD-10-CM | POA: Diagnosis not present

## 2014-12-09 DIAGNOSIS — R05 Cough: Secondary | ICD-10-CM | POA: Diagnosis not present

## 2014-12-09 DIAGNOSIS — R0600 Dyspnea, unspecified: Secondary | ICD-10-CM | POA: Diagnosis not present

## 2014-12-09 DIAGNOSIS — M6250 Muscle wasting and atrophy, not elsewhere classified, unspecified site: Secondary | ICD-10-CM | POA: Diagnosis not present

## 2014-12-09 DIAGNOSIS — R5381 Other malaise: Secondary | ICD-10-CM | POA: Diagnosis not present

## 2014-12-09 DIAGNOSIS — I5030 Unspecified diastolic (congestive) heart failure: Secondary | ICD-10-CM | POA: Diagnosis not present

## 2014-12-09 DIAGNOSIS — R609 Edema, unspecified: Secondary | ICD-10-CM | POA: Diagnosis not present

## 2014-12-09 DIAGNOSIS — R0602 Shortness of breath: Secondary | ICD-10-CM | POA: Diagnosis not present

## 2014-12-09 DIAGNOSIS — I10 Essential (primary) hypertension: Secondary | ICD-10-CM | POA: Diagnosis not present

## 2014-12-09 DIAGNOSIS — R1311 Dysphagia, oral phase: Secondary | ICD-10-CM | POA: Diagnosis not present

## 2014-12-09 DIAGNOSIS — I5033 Acute on chronic diastolic (congestive) heart failure: Secondary | ICD-10-CM | POA: Diagnosis not present

## 2014-12-09 DIAGNOSIS — I517 Cardiomegaly: Secondary | ICD-10-CM | POA: Diagnosis not present

## 2014-12-09 DIAGNOSIS — I5031 Acute diastolic (congestive) heart failure: Secondary | ICD-10-CM | POA: Diagnosis not present

## 2014-12-09 DIAGNOSIS — E871 Hypo-osmolality and hyponatremia: Secondary | ICD-10-CM | POA: Diagnosis not present

## 2014-12-09 DIAGNOSIS — E785 Hyperlipidemia, unspecified: Secondary | ICD-10-CM | POA: Diagnosis not present

## 2014-12-09 DIAGNOSIS — R2681 Unsteadiness on feet: Secondary | ICD-10-CM | POA: Diagnosis not present

## 2014-12-09 DIAGNOSIS — I5023 Acute on chronic systolic (congestive) heart failure: Secondary | ICD-10-CM | POA: Diagnosis not present

## 2014-12-09 DIAGNOSIS — R918 Other nonspecific abnormal finding of lung field: Secondary | ICD-10-CM | POA: Diagnosis not present

## 2014-12-09 LAB — GLUCOSE, CAPILLARY
GLUCOSE-CAPILLARY: 120 mg/dL — AB (ref 70–99)
Glucose-Capillary: 108 mg/dL — ABNORMAL HIGH (ref 70–99)

## 2014-12-09 LAB — BASIC METABOLIC PANEL
Anion gap: 7 (ref 5–15)
BUN: 23 mg/dL (ref 6–23)
CALCIUM: 8.3 mg/dL — AB (ref 8.4–10.5)
CO2: 45 mmol/L — AB (ref 19–32)
CREATININE: 0.85 mg/dL (ref 0.50–1.10)
Chloride: 85 mEq/L — ABNORMAL LOW (ref 96–112)
GFR calc non Af Amer: 65 mL/min — ABNORMAL LOW (ref >90)
GFR, EST AFRICAN AMERICAN: 75 mL/min — AB (ref >90)
GLUCOSE: 104 mg/dL — AB (ref 70–99)
POTASSIUM: 3.7 mmol/L (ref 3.5–5.1)
SODIUM: 137 mmol/L (ref 135–145)

## 2014-12-09 LAB — PROCALCITONIN: Procalcitonin: <0.1 ng/mL

## 2014-12-09 LAB — MAGNESIUM: Magnesium: 2 mg/dL (ref 1.5–2.5)

## 2014-12-09 MED ORDER — ALBUTEROL SULFATE HFA 108 (90 BASE) MCG/ACT IN AERS: 2.0000 | INHALATION_SPRAY | RESPIRATORY_TRACT | Status: DC | PRN

## 2014-12-09 MED ORDER — FUROSEMIDE 40 MG PO TABS: 40.0000 mg | ORAL_TABLET | Freq: Every day | ORAL | Status: DC

## 2014-12-09 MED ORDER — LOSARTAN POTASSIUM 50 MG PO TABS: 50.0000 mg | ORAL_TABLET | Freq: Every day | ORAL | Status: DC

## 2014-12-09 MED ORDER — SACCHAROMYCES BOULARDII 250 MG PO CAPS: 250.0000 mg | ORAL_CAPSULE | Freq: Two times a day (BID) | ORAL | Status: DC

## 2014-12-09 MED ORDER — ALBUTEROL SULFATE (2.5 MG/3ML) 0.083% IN NEBU: 2.5000 mg | INHALATION_SOLUTION | RESPIRATORY_TRACT | Status: DC | PRN

## 2014-12-09 MED ORDER — LEVOFLOXACIN 750 MG PO TABS: 750.0000 mg | ORAL_TABLET | Freq: Every day | ORAL | Status: DC

## 2014-12-09 MED ORDER — AMLODIPINE BESYLATE 5 MG PO TABS: 5.0000 mg | ORAL_TABLET | Freq: Every day | ORAL | Status: DC

## 2014-12-09 MED ORDER — HYDROCOD POLST-CHLORPHEN POLST 10-8 MG/5ML PO LQCR: 5.0000 mL | Freq: Two times a day (BID) | ORAL | Status: DC

## 2014-12-09 MED ORDER — PREDNISONE 5 MG PO TABS: 5.0000 mg | ORAL_TABLET | Freq: Every day | ORAL | Status: DC

## 2014-12-09 NOTE — Progress Notes (Signed)
Patient discharged to [facility] via Ambulance, report called to [place] at 1436. Patient stable at discharge.

## 2014-12-09 NOTE — Progress Notes (Signed)
Critical lab value CO2=45. On call provider notified. No new orders at this time. Will continue to monitor.

## 2014-12-09 NOTE — Clinical Social Work Note (Signed)
  CSW reviewed discharge summary and faxed to [place] [facility]  [place] called and spoke with Candace at [facility] to let her know about pt discharge  CSW prepared discharge packet and provided to RN  CSW met with pt and her daughter at bedside to let them know about discharge  CSW called for transport  No further CSW needs  CSW signing off  .Dede Query, LCSW [facility] Clinical Social Worker - Weekend Coverage cell #: [phone]

## 2014-12-09 NOTE — Clinical Social Work Placement (Signed)
Clinical Social Work Department CLINICAL SOCIAL WORK PLACEMENT NOTE 12/09/2014  Patient:  Virginia [name], Virginia [name]  Account Number:  [id number] [place] date:  11/30/2014  Clinical Social Worker:  Dede Query, CLINICAL SOCIAL WORKER  Date/time:  12/03/2014 03:33 PM  Clinical Social Work is seeking post-discharge placement for this patient at the following level of care:   SKILLED NURSING   (*CSW will update this form in Epic as items are completed)   12/03/2014  Patient/family provided with [facility] Department of Clinical Social Work's list of facilities offering this level of care within the geographic area requested by the patient (or if unable, by the patient's family).  12/03/2014  Patient/family informed of their freedom to choose among providers that offer the needed level of care, that participate in Medicare, Medicaid or managed care program needed by the patient, have an available bed and are willing to accept the patient.  12/03/2014  Patient/family informed of [facility]' ownership interest in [facility], as well as of the fact that they are under no obligation to receive care at this facility.  PASARR submitted to EDS on 12/01/2014 PASARR number received on 12/01/2014  FL2 transmitted to all facilities in geographic area requested by pt/family on  12/03/2014 FL2 transmitted to all facilities within larger geographic area on   Patient informed that his/her managed care company has contracts with or will negotiate with  certain facilities, including the following:     Patient/family informed of bed offers received:  12/04/2014 Patient chooses bed at [facility] Physician recommends and patient chooses bed at    Patient to be transferred to [facility] on  12/09/2014 Patient to be transferred to facility by ambulance Patient and family notified of transfer on 12/09/2014 Name of family member notified:  [name], daughter  The following physician request were entered  in Epic:   Additional Comments:  .Dede Query, [place] Worker - Weekend Coverage cell #: [phone]

## 2014-12-09 NOTE — Discharge Summary (Signed)
Physician Discharge Summary  Virginia [name] YVO:[id number] DOB: 08/08/1936 DOA: 11/30/2014  PCP: Tivis Ringer, MD  Admit date: 11/30/2014 Discharge date: 12/09/2014  Time spent: 30 minutes  Recommendations for Outpatient Follow-up:  1. Follow up with Cardiology as scheduled on 1/11 at 3pm 2. Follow up with PCP in 1-2 weeks 3. Wt on discharge: 74kg  Discharge Diagnoses:  Principal Problem:   CAP (community acquired pneumonia) Active Problems:   Memory disorder   Nausea and vomiting   Hyponatremia   Essential hypertension   Dyspnea   Congestive heart disease   Diastolic CHF, acute   Discharge Condition: Improved  Diet recommendation: Regular  Filed Weights   12/05/14 0550 12/07/14 0900 12/08/14 0851  Weight: 81.194 kg (179 lb) 79.833 kg (176 lb) 75.751 kg (167 lb)    History of present illness:  Please see h and p from 12/31 for details. Briefly, pt presented initially with complaints of increased cough and nausea. The patient was admitted with concerns of CAP.  Hospital Course:  Community-acquired pneumonia with wheezing Improved with bronchodilators and steroid taper. Pulmonary had been following with recommendations for completion of 7 days of abx and PO steroid taper on discharge. Pulmonary has since signed off. Appreciate recs. Pt continued to improve on a daily basis.  Acutely decompensated diastolic CHF, chronicity unclear Pt noted to have 15lb wt gain in the past several weeks prior to admit. Pt admits to drinking ample amounts of water at home. Elevated BNP noted into the 400's with symptomatic improvement noted with lasix. 2d echo with normal EF of 65-70% and grade 1 diastolic dysfunction. Pt continues to note good improvement with diuresis. Recent outpt stress test was neg. TSH and free T4 normal.  Pre-admit weight in mid-December 2015 of 74kg, admission wt of 81.19kg  Wt on discharge: 74kg  Nausea with abnormal LFTs. Symptomatically improved. Ultrasound  of the abdomen does not show any concerning intra-abdominal finding. No gallstones were noted. CBD was normal size. Nausea, likely due to respiratory issues. LFTs are slightly elevated but stable. This can be followed up as an outpatient.  Hyponatremia Sodium level has normalized. Hyponatremia, most likely due to vol overload. Cont to follow.  Memory disorder Continue with Aricept. Probably contributed to her episode of confusion the other night. No confusion the last 3 days. Reorient daily. Mobilize. She was seen by speech therapist and is on an appropriate diet.  History of essential hypertension. Blood pressure was somewhat inadequately controlled. The dose of amlodipine and ARB was increased recently. Continue to monitor for now.   Consultations:  Pulmonary  Discharge Exam: Filed Vitals:   12/08/14 0859 12/08/14 1523 12/08/14 2259 12/09/14 0500  BP:  114/54 146/59 140/85  Pulse:  72 81 77  Temp:  97.8 F (36.6 C) 98.3 F (36.8 C) 98.4 F (36.9 C)  TempSrc:  Oral Oral Oral  Resp:  16 16 16   Height:      Weight:      SpO2: 94% 93% 94% 94%    General: Awake, in nad Cardiovascular: regular, s1, s2 Respiratory: normal resp effort, no wheezing  Discharge Instructions     Medication List    TAKE these medications        albuterol 108 (90 BASE) MCG/ACT inhaler  Commonly known as:  PROVENTIL HFA;VENTOLIN HFA  Inhale 2 puffs into the lungs every 4 (four) hours as needed for wheezing or shortness of breath.     PRESERVISION AREDS 2 PO  Take 1 tablet by mouth  2 (two) times daily.     ALIVE WOMENS 50+ PO  Take by mouth daily.     amLODipine 5 MG tablet  Commonly known as:  NORVASC  Take 1 tablet (5 mg total) by mouth daily.     ARTIFICIAL TEAR OP  Place 1 drop into both eyes 2 (two) times daily.     aspirin 81 MG tablet  Take 81 mg by mouth daily.     b complex vitamins tablet  Take 1 tablet by mouth daily.     BYSTOLIC 10 MG tablet  Generic drug:  nebivolol   Take 10 mg by mouth daily.     chlorpheniramine-HYDROcodone 10-8 MG/5ML Lqcr  Commonly known as:  TUSSIONEX  Take 5 mLs by mouth every 12 (twelve) hours.     co-enzyme Q-10 50 MG capsule  Take 100 mg by mouth daily.     colesevelam 625 MG tablet  Commonly known as:  WELCHOL  Take 1,875 mg by mouth 2 (two) times daily with a meal. With Lunch and Dinner     donepezil 5 MG tablet  Commonly known as:  ARICEPT  Take 5 mg by mouth 2 (two) times daily.     FISH OIL PO  Take by mouth 2 (two) times daily.     furosemide 40 MG tablet  Commonly known as:  LASIX  Take 1 tablet (40 mg total) by mouth daily.     levofloxacin 750 MG tablet  Commonly known as:  LEVAQUIN  Take 1 tablet (750 mg total) by mouth daily.     losartan 50 MG tablet  Commonly known as:  COZAAR  Take 1 tablet (50 mg total) by mouth daily.     predniSONE 5 MG tablet  Commonly known as:  DELTASONE  Take 1 tablet (5 mg total) by mouth daily with breakfast.     saccharomyces boulardii 250 MG capsule  Commonly known as:  FLORASTOR  Take 1 capsule (250 mg total) by mouth 2 (two) times daily.     Vitamin D (Ergocalciferol) 50000 UNITS Caps capsule  Commonly known as:  DRISDOL  Take 50,000 Units by mouth every 7 (seven) days. On Fridays       Allergies  Allergen Reactions  . Lidocaine Anaphylaxis, Swelling, Rash and Other (See Comments)    Any of the " [facility] "   Follow-up Information    Follow up with Dorris Carnes, MD On 12/11/2014.   Specialty:  Cardiology   Why:  at [facility] information:   [address] [place] [address] [place] [address] [phone]       Follow up with Tivis Ringer, MD. Schedule an appointment as soon as possible for a visit in 1 week.   Specialty:  Internal Medicine   Contact information:   [place] [address] [phone]        The results of significant diagnostics from this hospitalization (including imaging, microbiology, ancillary  and laboratory) are listed below for reference.    Significant Diagnostic Studies: Dg Chest 2 View  12/02/2014   CLINICAL DATA:  Acute onset cough and chest congestion. Former smoker. Current history of hypertension.  EXAM: CHEST  2 VIEW  COMPARISON:  11/30/2014, 11/09/2009.  FINDINGS: Cardiac silhouette upper normal in size to slightly enlarged, unchanged. Interval development of patchy airspace opacities throughout both lungs, greatest in the right upper lobe. New small to moderate-sized bilateral pleural effusions. Osseous demineralization. Calcification within the right supraspinatus  tendon.  IMPRESSION: 1. New patchy airspace opacities throughout both lungs, greatest in the right upper lobe. Asymmetric airspace pulmonary edema and multifocal pneumonia can have this appearance. 2. New small to moderate-sized bilateral pleural effusions.   Electronically Signed   By: Evangeline Dakin M.D.   On: 12/02/2014 12:07   Dg Chest 2 View  11/30/2014   CLINICAL DATA:  Community acquired bacterial pneumonia.  EXAM: CHEST  2 VIEW  COMPARISON:  November 09, 2009.  FINDINGS: The heart size and mediastinal contours are within normal limits. Both lungs are clear. No pneumothorax or pleural effusion is noted. The visualized skeletal structures are unremarkable.  IMPRESSION: No acute cardiopulmonary abnormality seen.   Electronically Signed   By: Sabino Dick M.D.   On: 11/30/2014 14:53   Dg Abd 1 View  12/01/2014   CLINICAL DATA:  Bloating, nausea, vomiting.  EXAM: ABDOMEN - 1 VIEW  COMPARISON:  CT 10/14/2004  FINDINGS: Nonobstructive bowel gas pattern. No supine evidence of free air. No organomegaly. Vascular calcifications noted.  IMPRESSION: No evidence of obstruction.  No acute findings.   Electronically Signed   By: Rolm Baptise M.D.   On: 12/01/2014 11:25   US Abdomen Complete  12/02/2014   CLINICAL DATA:  Abdominal pain with nausea and vomiting for 1 day.  EXAM: ULTRASOUND ABDOMEN COMPLETE  COMPARISON:   10/14/2004 CT.  Plain film of 1 day prior.  FINDINGS: Gallbladder: No gallstones or wall thickening visualized. No sonographic Murphy sign noted.  Common bile duct: Diameter: Normal, 4 mm.  Liver: No focal lesion identified. Within normal limits in parenchymal echogenicity.  IVC: No abnormality visualized.  Pancreas: Visualized portion unremarkable.  Spleen: Size and appearance within normal limits.  Right Kidney: Length: 8.5 cm. Echogenicity within normal limits. No mass or hydronephrosis visualized.  Left Kidney: Length: 9.2 cm. Echogenicity within normal limits. No mass or hydronephrosis visualized.  Abdominal aorta: No aneurysm visualized.  Other findings: No ascites. Right-sided pleural effusion incidentally noted.  IMPRESSION: 1.  No acute abdominal process. 2. Right-sided pleural effusion.   Electronically Signed   By: Abigail Miyamoto M.D.   On: 12/02/2014 11:15   Dg Chest Port 1 View  12/06/2014   CLINICAL DATA:  Clinical pneumonia  EXAM: PORTABLE CHEST - 1 VIEW  COMPARISON:  Portable chest x-ray of December 05, 2014 and December 02, 2014.  FINDINGS: The lungs are mildly hyperinflated. There are patchy airspace opacities in both lungs greater on the right than on the left. These are less conspicuous today. The heart is normal in size. The pulmonary vascularity is not engorged. The mediastinum is normal in width. No significant pleural effusion is visible today but the tips of the costophrenic angles are excluded from the study. There are chronic degenerative changes of the right shoulder.  IMPRESSION: COPD with superimposed pneumonia. There has been slight interval improvement in the bilateral airspace opacities since 02 December 2014.   Electronically Signed   By: David  Martinique   On: 12/06/2014 07:24   Dg Chest Port 1 View  12/05/2014   CLINICAL DATA:  Cough  EXAM: PORTABLE CHEST - 1 VIEW  COMPARISON:  12/02/2014  FINDINGS: Patchy infiltrate right upper lobe unchanged. Slight improvement left upper lobe  infiltrate. Bibasilar infiltrates unchanged. Small bilateral effusions.  IMPRESSION: Bilateral infiltrates are similar to the prior study with mild improvement left upper lobe. Findings suspicious for pneumonia versus heart failure.   Electronically Signed   By: Franchot Gallo M.D.   On: 12/05/2014  09:32    Microbiology: Recent Results (from the past 240 hour(s))  Respiratory virus panel (routine influenza)     Status: None   Collection Time: 12/05/14  4:27 PM  Result Value Ref Range Status   Source - [facility] NASAL WASHINGS  Corrected    Comment: CORRECTED ON 01/06 AT 2003: PREVIOUSLY REPORTED AS NASAL WASHINGS   Respiratory Syncytial Virus A NOT DETECTED  Final   Respiratory Syncytial Virus B NOT DETECTED  Final   Influenza A NOT DETECTED  Final   Influenza B NOT DETECTED  Final   Parainfluenza 1 NOT DETECTED  Final   Parainfluenza 2 NOT DETECTED  Final   Parainfluenza 3 NOT DETECTED  Final   Metapneumovirus NOT DETECTED  Final   Rhinovirus NOT DETECTED  Final   Adenovirus NOT DETECTED  Final   Influenza A H1 NOT DETECTED  Final   Influenza A H3 NOT DETECTED  Final    Comment: (NOTE)       Normal Reference Range for each Analyte: NOT DETECTED Testing performed using the Luminex xTAG Respiratory Viral Panel test kit. The analytical performance characteristics of this assay have been determined by [employer].  The modifications have not been cleared or approved by the FDA. This assay has been validated pursuant to the CLIA regulations and is used for clinical purposes. Performed at [employer] [facility]: Basic Metabolic Panel:  Recent Labs Lab 12/05/14 0505 12/06/14 0540 12/07/14 0550 12/08/14 0539 12/09/14 0435  NA 136 137 136 135 137  K 4.1 3.7 4.3 4.1 3.7  CL 102 96 95* 88* 85*  CO2 28 35* 35* 38* 45*  GLUCOSE 101* 115* 104* 95 104*  BUN 17 19 17 23 23   CREATININE 0.70 0.83 0.76 0.80 0.85  CALCIUM 8.3* 8.2* 8.2* 8.2* 8.3*  MG  --   --   --   --   2.0   Liver Function Tests:  Recent Labs Lab 12/03/14 0500 12/07/14 0550  AST 30 19  ALT 53* 26  ALKPHOS 73 44  BILITOT 0.7 0.8  PROT 6.0 5.4*  ALBUMIN 2.9* 2.5*   No results for input(s): LIPASE, AMYLASE in the last 168 hours. No results for input(s): AMMONIA in the last 168 hours. CBC:  Recent Labs Lab 12/03/14 0500 12/04/14 0437 12/06/14 0540 12/07/14 0550  WBC 11.4* 12.4* 11.6* 12.8*  HGB 12.4 12.3 12.8 12.5  HCT 39.2 40.0 41.3 41.4  MCV 89.5 90.9 89.8 92.0  PLT 298 306 305 315   Cardiac Enzymes: No results for input(s): CKTOTAL, CKMB, CKMBINDEX, TROPONINI in the last 168 hours. BNP: BNP (last 3 results) No results for input(s): PROBNP in the last 8760 hours. CBG:  Recent Labs Lab 12/07/14 1706 12/08/14 0758 12/08/14 1124 12/08/14 1756 12/09/14 0744  GLUCAP 157* 82 106* 173* 120*   Signed:  CHIU, STEPHEN K  [facility] 12/09/2014, 11:16 AM

## 2014-12-11 ENCOUNTER — Ambulatory Visit (INDEPENDENT_AMBULATORY_CARE_PROVIDER_SITE_OTHER): Payer: Medicare Other | Admitting: Internal Medicine

## 2014-12-11 ENCOUNTER — Encounter: Payer: Self-pay | Admitting: Internal Medicine

## 2014-12-11 VITALS — BP 108/60 | HR 70 | Ht 64.0 in | Wt 162.0 lb

## 2014-12-11 DIAGNOSIS — E785 Hyperlipidemia, unspecified: Secondary | ICD-10-CM

## 2014-12-11 DIAGNOSIS — J984 Other disorders of lung: Secondary | ICD-10-CM | POA: Diagnosis not present

## 2014-12-11 DIAGNOSIS — R0602 Shortness of breath: Secondary | ICD-10-CM | POA: Diagnosis not present

## 2014-12-11 DIAGNOSIS — R0600 Dyspnea, unspecified: Secondary | ICD-10-CM

## 2014-12-11 NOTE — Progress Notes (Signed)
HPI Patinet is a 79 yo who has a history of PVOD (s/o L CEA), HTN and tob use.  I saw her for the first time earlier ths winter for dyspnea.  She underwent myoview testing This was neg for ischemia   Since seen she was admitted for community acquired pneumonia  She was seen by pulm ([facility])Felt she prob had viral pneumonitis  COmplicated was upper airway wheeze  Recomm PPI, flonase,  She had quick course of sterorids and abx.NO follow up scheduled.    She was discharged a few days ago from the [facility].  She is in nursing facility now for PT  Breathing is getting better since d/c but not baseline  Lower extremity edema is better   Allergies  Allergen Reactions  . Lidocaine Anaphylaxis, Swelling, Rash and Other (See Comments)    Any of the " [facility] "    Current Outpatient Prescriptions  Medication Sig Dispense Refill  . albuterol (PROVENTIL HFA;VENTOLIN HFA) 108 (90 BASE) MCG/ACT inhaler Inhale 2 puffs into the lungs every 4 (four) hours as needed for wheezing or shortness of breath. 1 Inhaler 0  . amLODipine (NORVASC) 5 MG tablet Take 1 tablet (5 mg total) by mouth daily. 30 tablet 0  . ARTIFICIAL TEAR OP Place 1 drop into both eyes 2 (two) times daily.    Marland Kitchen aspirin 81 MG tablet Take 81 mg by mouth daily.    Marland Kitchen b complex vitamins tablet Take 1 tablet by mouth daily.    Marland Kitchen BYSTOLIC 10 MG tablet Take 10 mg by mouth daily.  0  . chlorpheniramine-HYDROcodone (TUSSIONEX) 10-8 MG/5ML LQCR Take 5 mLs by mouth every 12 (twelve) hours. 115 mL 0  . co-enzyme Q-10 50 MG capsule Take 100 mg by mouth daily.     . colesevelam (WELCHOL) 625 MG tablet Take 1,875 mg by mouth 2 (two) times daily with a meal. With Lunch and Dinner    . donepezil (ARICEPT) 5 MG tablet Take 5 mg by mouth 2 (two) times daily.    Marland Kitchen ENSURE (ENSURE) Take 1 Can by mouth 2 (two) times daily between meals.    . furosemide (LASIX) 40 MG tablet Take 1 tablet (40 mg total) by mouth daily. 30 tablet 0  . levofloxacin (LEVAQUIN) 750 MG  tablet Take 1 tablet (750 mg total) by mouth daily. 3 tablet 0  . losartan (COZAAR) 50 MG tablet Take 1 tablet (50 mg total) by mouth daily. 30 tablet 0  . Multiple Vitamins-Minerals (ALIVE WOMENS 50+ PO) Take by mouth daily.    . Multiple Vitamins-Minerals (PRESERVISION AREDS 2 PO) Take 1 tablet by mouth 2 (two) times daily.     . Omega-3 Fatty Acids (FISH OIL PO) Take by mouth 2 (two) times daily.    . pantoprazole (PROTONIX) 20 MG tablet Take 20 mg by mouth 2 (two) times daily.    . predniSONE (DELTASONE) 5 MG tablet Take 1 tablet (5 mg total) by mouth daily with breakfast.    . saccharomyces boulardii (FLORASTOR) 250 MG capsule Take 1 capsule (250 mg total) by mouth 2 (two) times daily. 30 capsule 0  . Vitamin D, Ergocalciferol, (DRISDOL) 50000 UNITS CAPS Take 50,000 Units by mouth every 7 (seven) days. On Fridays     No current facility-administered medications for this visit.    Past Medical History  Diagnosis Date  . Hyperlipidemia   . Hypertension   . Carotid artery occlusion   . Fall at home Sept. 2013  .  Memory disorder 10/24/2014    Past Surgical History  Procedure Laterality Date  . Carotid endarterectomy      left CEA  . Tonsillectomy    . Appendectomy    . Breast reduction surgery    . Skin cancer removal    . Cataract extraction Bilateral   . Eye surgery      Family History  Problem Relation Age of Onset  . Heart disease Father     Before age 72  . Hypertension Father   . Cancer Mother     Brain  . Dementia Brother   . Heart attack Father   . Heart failure Maternal Grandmother   . Stroke Neg Hx     History   Social History  . Marital Status: Divorced    Spouse Name: N/A    Number of Children: N/A  . Years of Education: N/A   Occupational History  . Not on file.   Social History Main Topics  . Smoking status: Former Smoker    Quit date: 11/30/1994  . Smokeless tobacco: Never Used  . Alcohol Use: No  . Drug Use: No  . Sexual Activity: Not  on file   Other Topics Concern  . Not on file   Social History Narrative    Review of Systems:  All systems reviewed.  They are negative to the above problem except as previously stated.  Vital Signs: BP 108/60 mmHg  Pulse 70  Ht 5\' 4"  (1.626 m)  Wt 162 lb (73.483 kg)  BMI 27.79 kg/m2  Physical Exam  HEENT:  Normocephalic, atraumatic. EOMI, PERRLA.  Neck: JVP is normal.  No bruits.  Lungs: clear to auscultation. No rales no wheezes.  Heart: Regular rate and rhythm. Normal S1, S2. No S3.   No significant murmurs. PMI not displaced.  Abdomen:  Supple, nontender. Normal bowel sounds. No masses. No hepatomegaly.  Extremities:   Good distal pulses throughout. 1+ lower extremity edema.  Musculoskeletal :moving all extremities.  Neuro:   alert and oriented x3.  CN II-XII grossly intact.  EKG  SR 70 bpm    Assessment and Plan:  1.  Dyspnea   Currently recovering from URI, probable viral  Improving  WIll follow  Myoview neg for ischemia prior to admit  Volume increase on last admit (diastoic CHF) prob exacerbating by URI  On Lasix  Will follow labs  May ot need long term   CHeck f/u in 2 wks    2.  PVOD  S/p L CEA  WIll need to check lpiids  She is not on a statin    3.    WIll set f/u for Feb

## 2014-12-11 NOTE — Patient Instructions (Signed)
You have a follow up appointment scheduled with Dr. Harrington Challenger for January 25,2016 at 9:00

## 2014-12-12 DIAGNOSIS — I5030 Unspecified diastolic (congestive) heart failure: Secondary | ICD-10-CM | POA: Diagnosis not present

## 2014-12-12 DIAGNOSIS — R5381 Other malaise: Secondary | ICD-10-CM | POA: Diagnosis not present

## 2014-12-12 DIAGNOSIS — I10 Essential (primary) hypertension: Secondary | ICD-10-CM | POA: Diagnosis not present

## 2014-12-12 DIAGNOSIS — J189 Pneumonia, unspecified organism: Secondary | ICD-10-CM | POA: Diagnosis not present

## 2014-12-12 LAB — BASIC METABOLIC PANEL
BUN: 29 mg/dL — ABNORMAL HIGH (ref 6–23)
CO2: 35 mEq/L — ABNORMAL HIGH (ref 19–32)
CREATININE: 1.4 mg/dL — AB (ref 0.4–1.2)
Calcium: 8.2 mg/dL — ABNORMAL LOW (ref 8.4–10.5)
Chloride: 91 mEq/L — ABNORMAL LOW (ref 96–112)
GFR: 40.44 mL/min — AB (ref >60.00)
Glucose, Bld: 160 mg/dL — ABNORMAL HIGH (ref 70–99)
Potassium: 4.5 mEq/L (ref 3.5–5.1)
SODIUM: 133 meq/L — AB (ref 135–145)

## 2014-12-12 LAB — BRAIN NATRIURETIC PEPTIDE: PRO B NATRI PEPTIDE: 87 pg/mL (ref 0.0–100.0)

## 2014-12-13 ENCOUNTER — Telehealth: Payer: Self-pay | Admitting: Internal Medicine

## 2014-12-13 NOTE — Telephone Encounter (Signed)
New Message         Pt's daughter calling to get pt's lab results. Please call back and advise.

## 2014-12-13 NOTE — Telephone Encounter (Signed)
Notes Recorded by Rodman Key, RN on 12/13/2014 at 4:59 PM Informed Eli Phillips, nurse at [facility] [phone]). Also informed Daughter Lilianah Buffin. [place] will draw BMET in 7 days. Notes Recorded by Fay Records, MD on 12/12/2014 at 5:08 PM Patient should cut back on lasix to every other day. May pull back further Get BMET in 7 days.

## 2014-12-15 ENCOUNTER — Telehealth: Payer: Self-pay | Admitting: Internal Medicine

## 2014-12-15 ENCOUNTER — Encounter: Payer: Medicare Other | Admitting: Internal Medicine

## 2014-12-15 DIAGNOSIS — R0602 Shortness of breath: Secondary | ICD-10-CM

## 2014-12-15 LAB — PULMONARY FUNCTION TEST
DL/VA % PRED: 110 %
DL/VA: 5.16 ml/min/mmHg/L
DLCO UNC % PRED: 46 %
DLCO UNC: 10.56 ml/min/mmHg
FEF 25-75 POST: 0.33 L/s
FEF 25-75 PRE: 0.21 L/s
FEF2575-%CHANGE-POST: 53 %
FEF2575-%Pred-Post: 21 %
FEF2575-%Pred-Pre: 14 %
FEV1-%Change-Post: 17 %
FEV1-%PRED-POST: 29 %
FEV1-%Pred-Pre: 24 %
FEV1-Post: 0.57 L
FEV1-Pre: 0.49 L
FEV1FVC-%CHANGE-POST: -2 %
FEV1FVC-%PRED-PRE: 74 %
FEV6-%Change-Post: 18 %
FEV6-%PRED-PRE: 35 %
FEV6-%Pred-Post: 42 %
FEV6-POST: 1.06 L
FEV6-Pre: 0.89 L
FEV6FVC-%Pred-Post: 105 %
FEV6FVC-%Pred-Pre: 105 %
FVC-%Change-Post: 20 %
FVC-%Pred-Post: 40 %
FVC-%Pred-Pre: 33 %
FVC-POST: 1.07 L
FVC-PRE: 0.89 L
Post FEV1/FVC ratio: 54 %
Post FEV6/FVC ratio: 100 %
Pre FEV1/FVC ratio: 55 %
Pre FEV6/FVC Ratio: 100 %

## 2014-12-15 NOTE — Telephone Encounter (Signed)
I reviewed complaint with Dr. Harrington Challenger who states she will call patient's daughter and advise her that PFT preliminary report is available, however report needs to be read by pulmonology before diagnosis/plan is made.

## 2014-12-15 NOTE — Telephone Encounter (Signed)
New Msg       Pt Daughter calling wants pt to get Dr. Harrington Challenger to refer pt to a pulmonary office because they are having a breathing test right now,  & pt is not breathing very well. Test hasn't been read just yet, but daughter wants mother to have an appt made.   Please contact pt daughter.

## 2014-12-15 NOTE — Telephone Encounter (Signed)
Spoke with patient's daughter, [name], who states she took the patient for breathing test and she believes the patient failed; states patient has gone back to [facility], her assisted living facility.  She states she believes the patient needs a referral to pulmonology to be seen Monday, is concerned patient is otherwise going to end up back in the hospital.  [name] states she would like Dr. Harrington Challenger to call [facility] and ask the facility physician or PA to prescribe antibiotics and to call the pulmonologist for soon appointment.  I advised [name] that I will discuss with Dr. Harrington Challenger, who is in the office today. [name] verbalized understanding and agreement.

## 2014-12-18 ENCOUNTER — Other Ambulatory Visit: Payer: Self-pay

## 2014-12-18 MED ORDER — DONEPEZIL HCL 5 MG PO TABS: 5.0000 mg | ORAL_TABLET | Freq: Two times a day (BID) | ORAL | Status: DC

## 2014-12-18 NOTE — Telephone Encounter (Signed)
Virginia Ducking, MD at 10/30/2014 10:55 AM     Status: Signed       Expand All Collapse All   I called the patient. The patient has had some difficulty with sleeping on Aricept 10 mg. They are split dose 5 mg in the morning and 5 in the evening. High blood pressures not listed as a potential side effect of the Aricept.

## 2014-12-19 ENCOUNTER — Other Ambulatory Visit: Payer: Self-pay | Admitting: *Deleted

## 2014-12-19 DIAGNOSIS — M6250 Muscle wasting and atrophy, not elsewhere classified, unspecified site: Secondary | ICD-10-CM | POA: Diagnosis not present

## 2014-12-19 DIAGNOSIS — I5033 Acute on chronic diastolic (congestive) heart failure: Secondary | ICD-10-CM | POA: Diagnosis not present

## 2014-12-19 DIAGNOSIS — R942 Abnormal results of pulmonary function studies: Secondary | ICD-10-CM

## 2014-12-22 ENCOUNTER — Institutional Professional Consult (permissible substitution): Payer: Medicare Other | Admitting: Internal Medicine

## 2014-12-24 NOTE — Progress Notes (Signed)
Error made  Patient not seen in clinic   This encounter was created in error - please disregard.

## 2014-12-25 ENCOUNTER — Encounter: Payer: Medicare Other | Admitting: Internal Medicine

## 2014-12-29 ENCOUNTER — Encounter: Payer: Self-pay | Admitting: Internal Medicine

## 2014-12-29 ENCOUNTER — Ambulatory Visit (INDEPENDENT_AMBULATORY_CARE_PROVIDER_SITE_OTHER)
Admission: RE | Admit: 2014-12-29 | Discharge: 2014-12-29 | Disposition: A | Discharge status: Home / Self Care | Payer: Medicare Other | Source: Ambulatory Visit | Attending: Internal Medicine | Admitting: Internal Medicine

## 2014-12-29 ENCOUNTER — Other Ambulatory Visit (INDEPENDENT_AMBULATORY_CARE_PROVIDER_SITE_OTHER): Payer: Medicare Other

## 2014-12-29 ENCOUNTER — Ambulatory Visit (INDEPENDENT_AMBULATORY_CARE_PROVIDER_SITE_OTHER): Payer: Medicare Other | Admitting: Internal Medicine

## 2014-12-29 VITALS — BP 106/60 | HR 89 | Temp 98.8°F | Ht 65.5 in | Wt 160.0 lb

## 2014-12-29 DIAGNOSIS — J449 Chronic obstructive pulmonary disease, unspecified: Secondary | ICD-10-CM | POA: Diagnosis not present

## 2014-12-29 DIAGNOSIS — R05 Cough: Secondary | ICD-10-CM | POA: Diagnosis not present

## 2014-12-29 DIAGNOSIS — R0600 Dyspnea, unspecified: Secondary | ICD-10-CM

## 2014-12-29 DIAGNOSIS — R0602 Shortness of breath: Secondary | ICD-10-CM | POA: Diagnosis not present

## 2014-12-29 DIAGNOSIS — J9611 Chronic respiratory failure with hypoxia: Secondary | ICD-10-CM | POA: Diagnosis not present

## 2014-12-29 DIAGNOSIS — R918 Other nonspecific abnormal finding of lung field: Secondary | ICD-10-CM | POA: Diagnosis not present

## 2014-12-29 DIAGNOSIS — I517 Cardiomegaly: Secondary | ICD-10-CM | POA: Diagnosis not present

## 2014-12-29 LAB — HEPATIC FUNCTION PANEL
ALBUMIN: 3.4 g/dL — AB (ref 3.5–5.2)
ALT: 17 U/L (ref 0–35)
AST: 22 U/L (ref 0–37)
Alkaline Phosphatase: 68 U/L (ref 39–117)
BILIRUBIN DIRECT: 0.2 mg/dL (ref 0.0–0.3)
TOTAL PROTEIN: 6.4 g/dL (ref 6.0–8.3)
Total Bilirubin: 0.9 mg/dL (ref 0.2–1.2)

## 2014-12-29 LAB — CBC WITH DIFFERENTIAL/PLATELET
BASOS PCT: 0.6 % (ref 0.0–3.0)
Basophils Absolute: 0.1 10*3/uL (ref 0.0–0.1)
Eosinophils Absolute: 0.2 10*3/uL (ref 0.0–0.7)
Eosinophils Relative: 2.5 % (ref 0.0–5.0)
HCT: 40.1 % (ref 36.0–46.0)
HEMOGLOBIN: 13.5 g/dL (ref 12.0–15.0)
LYMPHS ABS: 1.9 10*3/uL (ref 0.7–4.0)
Lymphocytes Relative: 20.8 % (ref 12.0–46.0)
MCHC: 33.6 g/dL (ref 30.0–36.0)
MCV: 85.4 fl (ref 78.0–100.0)
Monocytes Absolute: 0.8 10*3/uL (ref 0.1–1.0)
Monocytes Relative: 8.8 % (ref 3.0–12.0)
NEUTROS ABS: 6.3 10*3/uL (ref 1.4–7.7)
Neutrophils Relative %: 67.3 % (ref 43.0–77.0)
Platelets: 259 10*3/uL (ref 150.0–400.0)
RBC: 4.69 Mil/uL (ref 3.87–5.11)
RDW: 15 % (ref 11.5–15.5)
WBC: 9.4 10*3/uL (ref 4.0–10.5)

## 2014-12-29 LAB — BASIC METABOLIC PANEL
BUN: 20 mg/dL (ref 6–23)
CALCIUM: 9 mg/dL (ref 8.4–10.5)
CHLORIDE: 104 meq/L (ref 96–112)
CO2: 25 mEq/L (ref 19–32)
Creatinine, Ser: 1.08 mg/dL (ref 0.40–1.20)
GFR: 52.31 mL/min — AB (ref >60.00)
Glucose, Bld: 96 mg/dL (ref 70–99)
POTASSIUM: 3.9 meq/L (ref 3.5–5.1)
Sodium: 140 mEq/L (ref 135–145)

## 2014-12-29 LAB — BRAIN NATRIURETIC PEPTIDE: Pro B Natriuretic peptide (BNP): 130 pg/mL — ABNORMAL HIGH (ref 0.0–100.0)

## 2014-12-29 LAB — TSH: TSH: 1 u[IU]/mL (ref 0.35–4.50)

## 2014-12-29 LAB — SEDIMENTATION RATE: SED RATE: 51 mm/h — AB (ref 0–22)

## 2014-12-29 NOTE — Progress Notes (Signed)
Quick Note:  Spoke with pt and notified of results per Dr. Wert. Pt verbalized understanding and denied any questions.  ______ 

## 2014-12-29 NOTE — Patient Instructions (Addendum)
Please remember to go to the lab and x-ray department downstairs for your tests - we will call you with the results when they are available.  Best way to take protonix  (pantoprazole) is to  Take 30- 60 min before your first and last meals of the day     Stop fish oil  GERD (REFLUX)  is an extremely common cause of respiratory symptoms just like yours , many times with no obvious heartburn at all.    It can be treated with medication, but also with lifestyle changes including avoidance of late meals, excessive alcohol, smoking cessation, and avoid fatty foods, chocolate, peppermint, colas, red wine, and acidic juices such as orange juice.  NO MINT OR MENTHOL PRODUCTS SO NO COUGH DROPS  USE SUGARLESS CANDY INSTEAD (Jolley ranchers or Stover's or Life Savers) or even ice chips will also do - the key is to swallow to prevent all throat clearing. NO OIL BASED VITAMINS - use powdered substitutes.    May need to consider alternative to norvasc (amlodipine) due to the leg swelling   Ok to to continue duoneb up to every 4 hours as needed if you can't your breath   As long as you are able to do what you want to do without breathing limiting you then pulmonary follow up can be as needed

## 2014-12-29 NOTE — Progress Notes (Signed)
Subjective:     Patient ID: Virginia [name], female   DOB: May 24, 1936,   MRN: [id number]  HPI  21 yowf quit smoking 2005 no symptoms relatively sedentary followed by [facility] not on resp meds or 02 acutely ill :   Admit date: 11/30/2014 Discharge date: 12/09/2014    Recommendations for Outpatient Follow-up:  1. Follow up with Cardiology as scheduled on 1/11 at 3pm 2. Follow up with PCP in 1-2 weeks 3. Wt on discharge: 74kg  Discharge Diagnoses:    CAP (community acquired pneumonia)   Memory disorder  Nausea and vomiting  Hyponatremia  Essential hypertension  Dyspnea  Congestive heart disease  Diastolic CHF, acute      History of present illness:  Please see h and p from 12/31 for details. Briefly, pt presented initially with complaints of increased cough and nausea. The patient was admitted with concerns of CAP.  Hospital Course:  Community-acquired pneumonia with wheezing Improved with bronchodilators and steroid taper. Pulmonary had been following with recommendations for completion of 7 days of abx and PO steroid taper on discharge. Pulmonary has since signed off. Appreciate recs. Pt continued to improve on a daily basis.  Acutely decompensated diastolic CHF, chronicity unclear Pt noted to have 15lb wt gain in the past several weeks prior to admit. Pt admits to drinking ample amounts of water at home. Elevated BNP noted into the 400's with symptomatic improvement noted with lasix. 2d echo with normal EF of 65-70% and grade 1 diastolic dysfunction. Pt continues to note good improvement with diuresis. Recent outpt stress test was neg. TSH and free T4 normal.  Pre-admit weight in mid-December 2015 of 74kg, admission wt of 81.19kg  Wt on discharge: 74kg  Nausea with abnormal LFTs. Symptomatically improved. Ultrasound of the abdomen does not show any concerning intra-abdominal finding. No gallstones were noted. CBD was normal size. Nausea, likely due to respiratory issues.  LFTs are slightly elevated but stable. This can be followed up as an outpatient.  Hyponatremia Sodium level has normalized. Hyponatremia, most likely due to vol overload. Cont to follow.  Memory disorder Continue with Aricept. Probably contributed to her episode of confusion the other night. No confusion the last 3 days. Reorient daily. Mobilize. She was seen by speech therapist and is on an appropriate diet.  History of essential hypertension. Blood pressure was somewhat inadequately controlled. The dose of amlodipine and ARB was increased recently. Continue to monitor for now.          12/29/2014 pulmonary consultation re GOLD IV copd/ now 02 dep   Chief Complaint  Patient presents with  . Pulmonary Consult    Referred by Dr. Dorris Carnes. Pt states started having SOB "a while ago". She gets SOB walking up stairs or sometimes flat surface.   was in w/c now in walker making slow steady progress at inpatient  Rehab up and down the aisles ok  Sleeping at 45 degrees  Intermittently coughing sometimes productive min mucoid sputum  No obvious day to day or daytime variabilty or assoc chronic cough or cp or chest tightness, subjective wheeze overt sinus or hb symptoms. No unusual exp hx or h/o childhood pna/ asthma or knowledge of premature birth.  Sleeping ok without nocturnal  or early am exacerbation  of respiratory  c/o's or need for noct saba. Also denies any obvious fluctuation of symptoms with weather or environmental changes or other aggravating or alleviating factors except as outlined above   Current Medications, Allergies, Complete Past Medical  History, Past Surgical History, Family History, and Social History were reviewed in [employer] record.  ROS  The following are not active complaints unless bolded sore throat, dysphagia, dental problems, itching, sneezing,  nasal congestion or excess/ purulent secretions, ear ache,   fever, chills, sweats,  unintended wt loss, pleuritic or exertional cp, hemoptysis,  orthopnea pnd or leg swelling improved , presyncope, palpitations, heartburn, abdominal pain, anorexia, nausea, vomiting, diarrhea  or change in bowel or urinary habits, change in stools or urine, dysuria,hematuria,  rash, arthralgias, visual complaints, headache, numbness weakness or ataxia or problems with walking or coordination,  change in mood/affect or memory.          Review of Systems     Objective:   Physical Exam    hoarse amb wf   Wt Readings from Last 3 Encounters:  12/29/14 160 lb (72.576 kg)  12/11/14 162 lb (73.483 kg)  12/09/14 164 lb 6.4 oz (74.571 kg)    Vital signs reviewed   HEENT: nl dentition, turbinates, and orophanx. Nl external ear canals without cough reflex   NECK :  without JVD/Nodes/TM/ nl carotid upstrokes bilaterally   LUNGS: no acc muscle use, clear to A and P bilaterally without cough on insp or exp maneuvers   CV:  RRR  no s3 or murmur or increase in P2, 1+ pitting edema both lower ext    ABD:  soft and nontender with nl excursion in the supine position. No bruits or organomegaly, bowel sounds nl  MS:  warm without deformities, calf tenderness, cyanosis or clubbing  SKIN: warm and dry without lesions    NEURO:  alert, approp, no deficits    CXR PA and Lateral:   12/29/2014 :     I personally reviewed images and agree with radiology impression as follows:   Hyperinflation compatible with COPD/ emphysema Cardiomegaly without acute process    Recent Labs Lab 12/29/14 1242  NA 140  K 3.9  CL 104  CO2 25  BUN 20  CREATININE 1.08  GLUCOSE 96    Recent Labs Lab 12/29/14 1242  HGB 13.5  HCT 40.1  WBC 9.4  PLT 259.0     Lab Results  Component Value Date   TSH 1.00 12/29/2014     Lab Results  Component Value Date   PROBNP 130.0* 12/29/2014     Lab Results  Component Value Date   ESRSEDRATE 51* 12/29/2014       Assessment:

## 2014-12-30 ENCOUNTER — Encounter: Payer: Self-pay | Admitting: Internal Medicine

## 2014-12-30 DIAGNOSIS — J9611 Chronic respiratory failure with hypoxia: Secondary | ICD-10-CM | POA: Insufficient documentation

## 2014-12-30 DIAGNOSIS — J449 Chronic obstructive pulmonary disease, unspecified: Secondary | ICD-10-CM | POA: Insufficient documentation

## 2014-12-30 HISTORY — DX: Chronic obstructive pulmonary disease, unspecified: J44.9

## 2014-12-30 NOTE — Assessment & Plan Note (Signed)
No longer with sign chf/ has some leg swelling residual probably related to amlodipine / low albumin and slowly improving > no change needed

## 2014-12-30 NOTE — Assessment & Plan Note (Signed)
Placed on 02 at d/c from [facility] service 12/09/14  Adequate control on present rx, reviewed > no change in rx needed

## 2014-12-30 NOTE — Assessment & Plan Note (Signed)
PFTs 12/15/14 FEV1  0.57 (29%) ratio 54 p 17% resp to saba    When respiratory symptoms begin or become refractory well after a patient reports complete smoking cessation,  Especially when this wasn't the case while they were smoking, a red flag is raised > That is to say, once the smoking stops,  the symptoms should not suddenly erupt or markedly worsen.  If so, the differential diagnosis should include  obesity/deconditioning,  LPR/Reflux/Aspiration syndromes,  occult CHF, or  especially side effect of medications commonly used in this population.    In her case pna/ chf was suspected and have resolved and she is back near baseline so fine to use duoneb prn and if find it really helps vs not then best choice would be stiolto or anoro (in absence of tendency for exac in which case ICS should be added)    Each maintenance medication was reviewed in detail including most importantly the difference between maintenance and as needed and under what circumstances the prns are to be used.  Please see instructions for details which were reviewed in writing and the patient given a copy.

## 2015-01-01 ENCOUNTER — Encounter: Payer: Self-pay | Admitting: Internal Medicine

## 2015-01-01 ENCOUNTER — Ambulatory Visit (INDEPENDENT_AMBULATORY_CARE_PROVIDER_SITE_OTHER): Payer: Medicare Other | Admitting: Internal Medicine

## 2015-01-01 VITALS — BP 110/60 | HR 85 | Ht 65.5 in | Wt 160.4 lb

## 2015-01-01 DIAGNOSIS — J189 Pneumonia, unspecified organism: Secondary | ICD-10-CM | POA: Diagnosis not present

## 2015-01-01 DIAGNOSIS — F0390 Unspecified dementia without behavioral disturbance: Secondary | ICD-10-CM | POA: Diagnosis not present

## 2015-01-01 DIAGNOSIS — I5030 Unspecified diastolic (congestive) heart failure: Secondary | ICD-10-CM | POA: Diagnosis not present

## 2015-01-01 DIAGNOSIS — R0600 Dyspnea, unspecified: Secondary | ICD-10-CM

## 2015-01-01 DIAGNOSIS — I10 Essential (primary) hypertension: Secondary | ICD-10-CM | POA: Diagnosis not present

## 2015-01-01 DIAGNOSIS — M6250 Muscle wasting and atrophy, not elsewhere classified, unspecified site: Secondary | ICD-10-CM | POA: Diagnosis not present

## 2015-01-01 MED ORDER — AMLODIPINE BESYLATE 2.5 MG PO TABS: 2.5000 mg | ORAL_TABLET | Freq: Every day | ORAL | Status: DC

## 2015-01-01 NOTE — Progress Notes (Signed)
HPI Virginia [name] is a 79 yo who has a history of PVOD (s/o L CEA), HTN and tob use.  I saw her for the first time earlier ths winter for dyspnea.  She underwent myoview testing This was neg for ischemia   Since seen she was admitted for community acquired pneumonia  She was seen by pulm ([facility])Felt she prob had viral pneumonitis  COmplicated was upper airway wheeze  Recomm PPI, flonase,  She had quick course of sterorids and abx.NO follow up scheduled.   I saw her soon after D/c  Still SOB at times  Set up for PFTs which were markedly abnormal  Lead t opulm referrall  Seen by Selinda Orion last wk See note  Virginia [name] says she feels better  Doing exercises  Breating is better  No CP   Allergies  Allergen Reactions  . Lidocaine Anaphylaxis, Swelling, Rash and Other (See Comments)    Any of the " [facility] "    Current Outpatient Prescriptions  Medication Sig Dispense Refill  . albuterol (PROVENTIL HFA;VENTOLIN HFA) 108 (90 BASE) MCG/ACT inhaler Inhale 2 puffs into the lungs every 4 (four) hours as needed for wheezing or shortness of breath. 1 Inhaler 0  . amLODipine (NORVASC) 5 MG tablet Take 1 tablet (5 mg total) by mouth daily. 30 tablet 0  . ARTIFICIAL TEAR OP Place 1 drop into both eyes 2 (two) times daily.    Marland Kitchen aspirin 81 MG tablet Take 81 mg by mouth daily.    Marland Kitchen b complex vitamins tablet Take 1 tablet by mouth daily.    Marland Kitchen BYSTOLIC 10 MG tablet Take 10 mg by mouth daily.  0  . chlorpheniramine-HYDROcodone (TUSSIONEX) 10-8 MG/5ML LQCR Take 5 mLs by mouth every 12 (twelve) hours. 115 mL 0  . co-enzyme Q-10 50 MG capsule Take 100 mg by mouth daily.     . colesevelam (WELCHOL) 625 MG tablet Take 1,875 mg by mouth 2 (two) times daily with a meal. With Lunch and Dinner    . donepezil (ARICEPT) 5 MG tablet Take 1 tablet (5 mg total) by mouth 2 (two) times daily. 60 tablet 3  . ENSURE (ENSURE) Take 1 Can by mouth 2 (two) times daily between meals.    . furosemide (LASIX) 40 MG tablet Take 1 tablet (40 mg  total) by mouth daily. 30 tablet 0  . guaiFENesin (ROBITUSSIN) 100 MG/5ML liquid Take 200 mg by mouth 2 (two) times daily.    Marland Kitchen ipratropium-albuterol (DUONEB) 0.5-2.5 (3) MG/3ML SOLN Take 3 mLs by nebulization 3 (three) times daily.    Marland Kitchen losartan (COZAAR) 50 MG tablet Take 1 tablet (50 mg total) by mouth daily. 30 tablet 0  . Multiple Vitamins-Minerals (ALIVE WOMENS 50+ PO) Take by mouth daily.    . Multiple Vitamins-Minerals (PRESERVISION AREDS 2 PO) Take 1 tablet by mouth 2 (two) times daily.     . pantoprazole (PROTONIX) 20 MG tablet Take 20 mg by mouth 2 (two) times daily.    Marland Kitchen saccharomyces boulardii (FLORASTOR) 250 MG capsule Take 1 capsule (250 mg total) by mouth 2 (two) times daily. 30 capsule 0  . Vitamin D, Ergocalciferol, (DRISDOL) 50000 UNITS CAPS Take 50,000 Units by mouth every 7 (seven) days. On Fridays     No current facility-administered medications for this visit.    Past Medical History  Diagnosis Date  . Hyperlipidemia   . Hypertension   . Carotid artery occlusion   . Fall at home Sept. 2013  .  Memory disorder 10/24/2014    Past Surgical History  Procedure Laterality Date  . Carotid endarterectomy      left CEA  . Tonsillectomy    . Appendectomy    . Breast reduction surgery    . Skin cancer removal    . Cataract extraction Bilateral   . Eye surgery      Family History  Problem Relation Age of Onset  . Heart disease Father     Before age 48  . Hypertension Father   . Cancer Mother     Brain  . Dementia Brother   . Heart attack Father   . Heart failure Maternal Grandmother   . Stroke Neg Hx     History   Social History  . Marital Status: Divorced    Spouse Name: N/A    Number of Children: N/A  . Years of Education: N/A   Occupational History  . Not on file.   Social History Main Topics  . Smoking status: Former Smoker -- 0.25 packs/day for 14 years    Types: Cigarettes    Quit date: 11/30/1994  . Smokeless tobacco: Never Used  .  Alcohol Use: No  . Drug Use: No  . Sexual Activity: Not on file   Other Topics Concern  . Not on file   Social History Narrative    Review of Systems:  All systems reviewed.  They are negative to the above problem except as previously stated.  Vital Signs: BP 110/60 mmHg  Pulse 85  Ht 5' 5.5" (1.664 m)  Wt 160 lb 6.4 oz (72.757 kg)  BMI 26.28 kg/m2  SpO2 94%  Physical Exam  HEENT:  Normocephalic, atraumatic. EOMI, PERRLA.  Neck: JVP is normal.  No bruits.  Lungs: clear to auscultation. No rales no wheezes.  Heart: Regular rate and rhythm. Normal S1, S2. No S3.   No significant murmurs. PMI not displaced.  Abdomen:  Supple, nontender. Normal bowel sounds. No masses. No hepatomegaly.  Extremities:   Good distal pulses throughout. 1+ lower extremity edema.  Musculoskeletal :moving all extremities.  Neuro:   alert and oriented x3.  CN II-XII grossly intact.    Assessment and Plan:  1.  Dyspnea   Significantly improved from when I saw her last  Volume looks good  I would keep on current regimen  F/U in 4 wks when she is back at home  Watch salt  2  HTN  Cut back on amlodipine to 2.5  Follow LE edema   2.  PVOD  S/p L CEA  WIll need to check lpiids  She is not on a statin

## 2015-01-01 NOTE — Patient Instructions (Signed)
Your physician has recommended you make the following change in your medication:  1) DECREASE Amlodipine to 2.5 mg daily. 2) STOP Ensure  Your physician recommends that you schedule a follow-up appointment in: 4 weeks with Dr. Harrington Challenger.

## 2015-01-02 ENCOUNTER — Telehealth: Payer: Self-pay

## 2015-01-02 MED ORDER — MEMANTINE HCL ER 7 MG PO CP24: ORAL_CAPSULE | ORAL | Status: DC

## 2015-01-02 NOTE — Telephone Encounter (Signed)
Patient's daughter states the patient will be home from rehab on Thursday (01/04/2015) and from her last conversation with Dr. Jannifer Franklin would like to get patient started on new cognitive med on Friday. She wanted to let him know that the pulmonologist states the patient cannot have any drug with fish oil or Lovaza. Daughter states no need to call back unless necessary because clear on instructions from last call from Dr. Jannifer Franklin, if so call cell @  [phone]

## 2015-01-02 NOTE — Addendum Note (Signed)
Addended by: Margette Fast on: 01/02/2015 04:53 PM   Modules accepted: Orders

## 2015-01-02 NOTE — Telephone Encounter (Signed)
I will call in a prescription for the [place] now. We will workup a 28 mg daily, then convert to the once a day tablet.

## 2015-01-08 DIAGNOSIS — Z9181 History of falling: Secondary | ICD-10-CM | POA: Diagnosis not present

## 2015-01-08 DIAGNOSIS — Z720 Tobacco use: Secondary | ICD-10-CM | POA: Diagnosis not present

## 2015-01-08 DIAGNOSIS — Z7982 Long term (current) use of aspirin: Secondary | ICD-10-CM | POA: Diagnosis not present

## 2015-01-08 DIAGNOSIS — I5031 Acute diastolic (congestive) heart failure: Secondary | ICD-10-CM | POA: Diagnosis not present

## 2015-01-08 DIAGNOSIS — M6281 Muscle weakness (generalized): Secondary | ICD-10-CM | POA: Diagnosis not present

## 2015-01-08 DIAGNOSIS — F0390 Unspecified dementia without behavioral disturbance: Secondary | ICD-10-CM | POA: Diagnosis not present

## 2015-01-08 DIAGNOSIS — I10 Essential (primary) hypertension: Secondary | ICD-10-CM | POA: Diagnosis not present

## 2015-01-08 DIAGNOSIS — I2510 Atherosclerotic heart disease of native coronary artery without angina pectoris: Secondary | ICD-10-CM | POA: Diagnosis not present

## 2015-01-08 DIAGNOSIS — R269 Unspecified abnormalities of gait and mobility: Secondary | ICD-10-CM | POA: Diagnosis not present

## 2015-01-11 DIAGNOSIS — Z720 Tobacco use: Secondary | ICD-10-CM | POA: Diagnosis not present

## 2015-01-11 DIAGNOSIS — I10 Essential (primary) hypertension: Secondary | ICD-10-CM | POA: Diagnosis not present

## 2015-01-11 DIAGNOSIS — R269 Unspecified abnormalities of gait and mobility: Secondary | ICD-10-CM | POA: Diagnosis not present

## 2015-01-11 DIAGNOSIS — I2510 Atherosclerotic heart disease of native coronary artery without angina pectoris: Secondary | ICD-10-CM | POA: Diagnosis not present

## 2015-01-11 DIAGNOSIS — I5031 Acute diastolic (congestive) heart failure: Secondary | ICD-10-CM | POA: Diagnosis not present

## 2015-01-11 DIAGNOSIS — F0390 Unspecified dementia without behavioral disturbance: Secondary | ICD-10-CM | POA: Diagnosis not present

## 2015-01-12 DIAGNOSIS — Z720 Tobacco use: Secondary | ICD-10-CM | POA: Diagnosis not present

## 2015-01-12 DIAGNOSIS — I10 Essential (primary) hypertension: Secondary | ICD-10-CM | POA: Diagnosis not present

## 2015-01-12 DIAGNOSIS — F0390 Unspecified dementia without behavioral disturbance: Secondary | ICD-10-CM | POA: Diagnosis not present

## 2015-01-12 DIAGNOSIS — I2510 Atherosclerotic heart disease of native coronary artery without angina pectoris: Secondary | ICD-10-CM | POA: Diagnosis not present

## 2015-01-12 DIAGNOSIS — R269 Unspecified abnormalities of gait and mobility: Secondary | ICD-10-CM | POA: Diagnosis not present

## 2015-01-12 DIAGNOSIS — I5031 Acute diastolic (congestive) heart failure: Secondary | ICD-10-CM | POA: Diagnosis not present

## 2015-01-16 DIAGNOSIS — Z720 Tobacco use: Secondary | ICD-10-CM | POA: Diagnosis not present

## 2015-01-16 DIAGNOSIS — R269 Unspecified abnormalities of gait and mobility: Secondary | ICD-10-CM | POA: Diagnosis not present

## 2015-01-16 DIAGNOSIS — I5031 Acute diastolic (congestive) heart failure: Secondary | ICD-10-CM | POA: Diagnosis not present

## 2015-01-16 DIAGNOSIS — I2510 Atherosclerotic heart disease of native coronary artery without angina pectoris: Secondary | ICD-10-CM | POA: Diagnosis not present

## 2015-01-16 DIAGNOSIS — I10 Essential (primary) hypertension: Secondary | ICD-10-CM | POA: Diagnosis not present

## 2015-01-16 DIAGNOSIS — F0390 Unspecified dementia without behavioral disturbance: Secondary | ICD-10-CM | POA: Diagnosis not present

## 2015-01-17 ENCOUNTER — Encounter: Payer: Self-pay | Admitting: *Deleted

## 2015-01-17 DIAGNOSIS — Z720 Tobacco use: Secondary | ICD-10-CM | POA: Diagnosis not present

## 2015-01-17 DIAGNOSIS — I2510 Atherosclerotic heart disease of native coronary artery without angina pectoris: Secondary | ICD-10-CM | POA: Diagnosis not present

## 2015-01-17 DIAGNOSIS — I5031 Acute diastolic (congestive) heart failure: Secondary | ICD-10-CM | POA: Diagnosis not present

## 2015-01-17 DIAGNOSIS — R269 Unspecified abnormalities of gait and mobility: Secondary | ICD-10-CM | POA: Diagnosis not present

## 2015-01-17 DIAGNOSIS — F0390 Unspecified dementia without behavioral disturbance: Secondary | ICD-10-CM | POA: Diagnosis not present

## 2015-01-17 DIAGNOSIS — I10 Essential (primary) hypertension: Secondary | ICD-10-CM | POA: Diagnosis not present

## 2015-01-18 ENCOUNTER — Encounter: Payer: Self-pay | Admitting: Nurse Practitioner

## 2015-01-18 ENCOUNTER — Ambulatory Visit (INDEPENDENT_AMBULATORY_CARE_PROVIDER_SITE_OTHER): Payer: Medicare Other | Admitting: Nurse Practitioner

## 2015-01-18 VITALS — BP 132/62 | HR 73 | Temp 98.4°F | Resp 20 | Ht 63.78 in | Wt 164.2 lb

## 2015-01-18 DIAGNOSIS — I10 Essential (primary) hypertension: Secondary | ICD-10-CM | POA: Diagnosis not present

## 2015-01-18 DIAGNOSIS — J449 Chronic obstructive pulmonary disease, unspecified: Secondary | ICD-10-CM | POA: Diagnosis not present

## 2015-01-18 DIAGNOSIS — E785 Hyperlipidemia, unspecified: Secondary | ICD-10-CM

## 2015-01-18 DIAGNOSIS — I5032 Chronic diastolic (congestive) heart failure: Secondary | ICD-10-CM | POA: Diagnosis not present

## 2015-01-18 DIAGNOSIS — R413 Other amnesia: Secondary | ICD-10-CM | POA: Diagnosis not present

## 2015-01-18 MED ORDER — MEMANTINE HCL ER 28 MG PO CP24: 28.0000 mg | ORAL_CAPSULE | Freq: Every day | ORAL | Status: DC

## 2015-01-18 MED ORDER — DONEPEZIL HCL 10 MG PO TABS: 10.0000 mg | ORAL_TABLET | Freq: Every day | ORAL | Status: DC

## 2015-01-18 NOTE — Patient Instructions (Signed)
Follow up in 1 month for Extended Visit.

## 2015-01-18 NOTE — Progress Notes (Signed)
Patient ID: Virginia [name], female   DOB: [birth year], 79 y.o.   MRN: [id number]    PCP: Lauree Chandler, NP  Allergies  Allergen Reactions  . Lidocaine Anaphylaxis, Swelling, Rash and Other (See Comments)    Any of the " [place] "    Chief Complaint  Patient presents with  . Establish Care     HPI: Patient is a 79 y.o. female seen in the office today to establish care. Pt was previously living at [facility] for rehab after hospitalization for pneumonia. Now living at home with the assistance of her brother and daughter.   aricept 5 mg twice daily  Currently on namenda titration, currently on the 14 mg   no longer has PRN blood pressure medication, brother is concerned. Now on the PRN medication all the time. Taking Blood pressure readings three times day in both arms.  Seeing Dr Floyde Parkins- Neurologist for memory loss.   [facility]  3 years ago she fell down the steps at her grandmother house, PT was ordered but never followed up (3 years ago).   Advanced Directive information Does patient have an advance directive?: Yes, Type of Advance Directive: [place];Living will Review of Systems:  Review of Systems  Constitutional: Negative for activity change, appetite change, fatigue and unexpected weight change.  HENT: Negative for congestion and hearing loss.   Eyes: Negative.   Respiratory: Negative for cough and shortness of breath.   Cardiovascular: Negative for chest pain, palpitations and leg swelling.  Gastrointestinal: Negative for abdominal pain, diarrhea and constipation.  Genitourinary: Negative for dysuria and difficulty urinating.  Musculoskeletal: Negative for myalgias and arthralgias.  Skin: Negative for color change and wound.  Neurological: Negative for dizziness and weakness.  Psychiatric/Behavioral: Positive for confusion. Negative for behavioral problems and agitation.    Past Medical History    Diagnosis Date  . Hyperlipidemia   . Hypertension   . Carotid artery occlusion   . Fall at home Sept. 2013  . Memory disorder 10/24/2014   Past Surgical History  Procedure Laterality Date  . Carotid endarterectomy      left CEA  . Tonsillectomy    . Appendectomy    . Breast reduction surgery    . Skin cancer removal    . Cataract extraction Bilateral   . Colonoscopy  2015  . Eye surgery      cataracts removed, bilaterally    Social History:   reports that she quit smoking about 20 years ago. Her smoking use included Cigarettes. She quit after 14 years of use. She has never used smokeless tobacco. She reports that she does not drink alcohol or use illicit drugs.  Family History  Problem Relation Age of Onset  . Heart disease Father     Before age 53  . Hypertension Father   . Heart attack Father   . Cancer Mother 13    Brain  . Dementia Brother   . Heart failure Maternal Grandmother   . Stroke Neg Hx   . Cancer Maternal Aunt 61    breast cancer    Medications: Patient's Medications  New Prescriptions   No medications on file  Previous Medications   ALBUTEROL (PROVENTIL HFA;VENTOLIN HFA) 108 (90 BASE) MCG/ACT INHALER    Inhale 2 puffs into the lungs every 4 (four) hours as needed for wheezing or shortness of breath.   AMLODIPINE (NORVASC) 2.5 MG TABLET    Take  1 tablet (2.5 mg total) by mouth daily.   ARTIFICIAL TEAR OP    Place 1 drop into both eyes 2 (two) times daily.   ASPIRIN 81 MG TABLET    Take 81 mg by mouth daily.   B COMPLEX VITAMINS TABLET    Take 1 tablet by mouth daily.   BYSTOLIC 10 MG TABLET    Take 10 mg by mouth daily.   CHLORPHENIRAMINE-HYDROCODONE (TUSSIONEX) 10-8 MG/5ML LQCR    Take 5 mLs by mouth every 12 (twelve) hours.   CO-ENZYME Q-10 50 MG CAPSULE    Take 100 mg by mouth daily.    COLESEVELAM (WELCHOL) 625 MG TABLET    Take 1,875 mg by mouth 2 (two) times daily with a meal. With Lunch and Dinner   DONEPEZIL (ARICEPT) 5 MG TABLET    Take 1  tablet (5 mg total) by mouth 2 (two) times daily.   FUROSEMIDE (LASIX) 40 MG TABLET    Take 40 mg by mouth every other day.   LOSARTAN (COZAAR) 50 MG TABLET    Take 1 tablet (50 mg total) by mouth daily.   MEMANTINE (NAMENDA XR) 7 MG CP24 24 HR CAPSULE    1 capsule daily for one week, then take 2 capsules daily for one week, then take 3 capsules daily for one week, then take 4 capsules daily   MULTIPLE VITAMINS-MINERALS (ALIVE WOMENS 50+ PO)    Take by mouth daily.   SACCHAROMYCES BOULARDII (FLORASTOR) 250 MG CAPSULE    Take 1 capsule (250 mg total) by mouth 2 (two) times daily.   VITAMIN D, ERGOCALCIFEROL, (DRISDOL) 50000 UNITS CAPS    Take 50,000 Units by mouth every 7 (seven) days. On Fridays  Modified Medications   No medications on file  Discontinued Medications   ENSURE (ENSURE)    Take 1 Can by mouth 2 (two) times daily between meals.   FUROSEMIDE (LASIX) 40 MG TABLET    Take 1 tablet (40 mg total) by mouth daily.   GUAIFENESIN (ROBITUSSIN) 100 MG/5ML LIQUID    Take 200 mg by mouth 2 (two) times daily.   IPRATROPIUM-ALBUTEROL (DUONEB) 0.5-2.5 (3) MG/3ML SOLN    Take 3 mLs by nebulization 3 (three) times daily.   MULTIPLE VITAMINS-MINERALS (PRESERVISION AREDS 2 PO)    Take 1 tablet by mouth 2 (two) times daily.    PANTOPRAZOLE (PROTONIX) 20 MG TABLET    Take 20 mg by mouth 2 (two) times daily.     Physical Exam:  Filed Vitals:   01/18/15 1334  BP: 132/62  Pulse: 73  Temp: 98.4 F (36.9 C)  TempSrc: Oral  Resp: 20  Height: 5' 3.78" (1.62 m)  Weight: 164 lb 3.2 oz (74.481 kg)  SpO2: 95%    Physical Exam  Constitutional: She is oriented to person, place, and time. She appears well-developed and well-nourished. No distress.  HENT:  Head: Normocephalic and atraumatic.  Mouth/Throat: Oropharynx is clear and moist. No oropharyngeal exudate.  Eyes: Conjunctivae are normal. Pupils are equal, round, and reactive to light.  Neck: Normal range of motion. Neck supple.    Cardiovascular: Normal rate, regular rhythm and normal heart sounds.   Pulmonary/Chest: Effort normal and breath sounds normal.  Abdominal: Soft. Bowel sounds are normal.  Musculoskeletal: She exhibits no edema or tenderness.  Neurological: She is alert and oriented to person, place, and time.  Skin: Skin is warm and dry. She is not diaphoretic.  Psychiatric: She has a normal mood and affect.  Labs reviewed: Basic Metabolic Panel:  Recent Labs  12/07/14 1045  12/09/14 0435 12/11/14 1622 12/29/14 1242  NA  --   < > 137 133* 140  K  --   < > 3.7 4.5 3.9  CL  --   < > 85* 91* 104  CO2  --   < > 45* 35* 25  GLUCOSE  --   < > 104* 160* 96  BUN  --   < > 23 29* 20  CREATININE  --   < > 0.85 1.4* 1.08  CALCIUM  --   < > 8.3* 8.2* 9.0  MG  --   --  2.0  --   --   TSH 0.946  --   --   --  1.00  < > = values in this interval not displayed. Liver Function Tests:  Recent Labs  12/03/14 0500 12/07/14 0550 12/29/14 1242  AST 30 19 22   ALT 53* 26 17  ALKPHOS 73 44 68  BILITOT 0.7 0.8 0.9  PROT 6.0 5.4* 6.4  ALBUMIN 2.9* 2.5* 3.4*   No results for input(s): LIPASE, AMYLASE in the last 8760 hours. No results for input(s): AMMONIA in the last 8760 hours. CBC:  Recent Labs  12/06/14 0540 12/07/14 0550 12/29/14 1242  WBC 11.6* 12.8* 9.4  NEUTROABS  --   --  6.3  HGB 12.8 12.5 13.5  HCT 41.3 41.4 40.1  MCV 89.8 92.0 85.4  PLT 305 315 259.0   Lipid Panel:  Recent Labs  12/01/14 1210  CHOL 195  HDL 56  LDLCALC 122*  TRIG 83  CHOLHDL 3.5   TSH:  Recent Labs  12/07/14 1045 12/29/14 1242  TSH 0.946 1.00   A1C: Lab Results  Component Value Date   HGBA1C 5.8* 12/02/2014     Assessment/Plan  1. Memory disorder -has followed up with neurology due to memory disorder, recent started namanda titration, tolerating well so far. Will cont once at goal cont namenda XR 28 mg daily. Also to cont aricept. To take 10 mg ONCE daily. Will have pt take in the am since  it was disrupting her sleep at hs - donepezil (ARICEPT) 10 MG tablet; Take 1 tablet (10 mg total) by mouth daily.  Dispense: 30 tablet; Refill: 0 - memantine (NAMENDA XR) 28 MG CP24 24 hr capsule; Take 1 capsule (28 mg total) by mouth daily.  Dispense: 30 capsule; Refill: 0  2. Essential hypertension -reviewed blood pressure medication and blood pressure in great detail with family. To cont current regimen   3. Hyperlipidemia -LDL 122 in January, currently on welchol.   4. COPD GOLD IV conts on albuterol as needed, no routine COPD medications Has followed up with pulmonary since recent hospitalization with pneumonia. Pt denies any shortness of breath at this time.  5. Chronic diastolic congestive heart failure Following with Dr Harrington Challenger, conts on lasix, betablocker, and ARB. Without symptoms of shortness of breath or edema.     To follow up in 1 month with [facility]

## 2015-01-23 DIAGNOSIS — Z720 Tobacco use: Secondary | ICD-10-CM | POA: Diagnosis not present

## 2015-01-23 DIAGNOSIS — F0390 Unspecified dementia without behavioral disturbance: Secondary | ICD-10-CM | POA: Diagnosis not present

## 2015-01-23 DIAGNOSIS — I5031 Acute diastolic (congestive) heart failure: Secondary | ICD-10-CM | POA: Diagnosis not present

## 2015-01-23 DIAGNOSIS — I10 Essential (primary) hypertension: Secondary | ICD-10-CM | POA: Diagnosis not present

## 2015-01-23 DIAGNOSIS — I2510 Atherosclerotic heart disease of native coronary artery without angina pectoris: Secondary | ICD-10-CM | POA: Diagnosis not present

## 2015-01-23 DIAGNOSIS — R269 Unspecified abnormalities of gait and mobility: Secondary | ICD-10-CM | POA: Diagnosis not present

## 2015-01-25 DIAGNOSIS — Z720 Tobacco use: Secondary | ICD-10-CM | POA: Diagnosis not present

## 2015-01-25 DIAGNOSIS — R269 Unspecified abnormalities of gait and mobility: Secondary | ICD-10-CM | POA: Diagnosis not present

## 2015-01-25 DIAGNOSIS — I2510 Atherosclerotic heart disease of native coronary artery without angina pectoris: Secondary | ICD-10-CM | POA: Diagnosis not present

## 2015-01-25 DIAGNOSIS — I5031 Acute diastolic (congestive) heart failure: Secondary | ICD-10-CM | POA: Diagnosis not present

## 2015-01-25 DIAGNOSIS — F0390 Unspecified dementia without behavioral disturbance: Secondary | ICD-10-CM | POA: Diagnosis not present

## 2015-01-25 DIAGNOSIS — I10 Essential (primary) hypertension: Secondary | ICD-10-CM | POA: Diagnosis not present

## 2015-01-26 ENCOUNTER — Telehealth: Payer: Self-pay | Admitting: *Deleted

## 2015-01-26 NOTE — Telephone Encounter (Signed)
Pt needs to take 7 mg daily for 7 days, then increase to 14 mg daily for 7 days then to take 21 mg daily for 7 days then to start 28mg  daily and cont that dose. IF she is ONLY taking 7 mg daily she needs to titrate up to 28 mg, can come by the office and get the titration pack. It was my understanding that they were just giving her 7 mg tablets but increasing the number of tablets per week. Ie) namenda xr 7 mg daily 1 tablet times 7 days then namenda xr 7 mg 2 tablets daily for 7 days then namenda 7 mg 3 tablets daily for 7 days and THEN once they reached the 3 tablets which equal 21 mg for 7 days they could transition to the 28 mg tablet. If this is NOT what they are doing have them come by the office on Monday and start over with the titration pack to ensure it is being done correctly

## 2015-01-26 NOTE — Telephone Encounter (Signed)
Namenda 7mg . The Neurologist placed them on the titration and they have completed up to week 2, needing to start week 3 but don't have enough pills. He was just going to start the 28mg  but wanted advise first. Please advise.

## 2015-01-26 NOTE — Telephone Encounter (Signed)
Patient son called and stated that he doesn't have enough of Namenda to finish the titration. He has only completed week 2. Did not feel comfortable just starting the 28mg  tomorrow without your advise. He also wanted you to know that she hasn't slept well in 2 days. Please Advise.

## 2015-01-26 NOTE — Telephone Encounter (Signed)
What is the current dose of namenda she is taking?

## 2015-01-26 NOTE — Telephone Encounter (Signed)
[name], caregiver Notified and will come by the office before 5 to pick up Titration Pack

## 2015-01-28 NOTE — Progress Notes (Signed)
Cardiology Office Note   Date:  01/29/2015   ID:  SHARICKA POGORZELSKI, DOB July 25, 1936, MRN [id number]  PCP:  Lauree Chandler, NP  Cardiologist:   Dorris Carnes, MD   No chief complaint on file.     History of Present Illness: Virginia [name] is a 79 y.o. female with a history of PVOD (s/o L CEA), HTN and tob use. I saw her for the first time earlier ths winter for dyspnea. She underwent myoview testing This was neg for ischemia  Since seen she was admitted for community acquired pneumonia She was seen by pulm ([facility])Felt she prob had viral pneumonitis COmplicated was upper airway wheeze Recomm PPI, flonase, She had quick course of sterorids and abx I saw her immed after d/c  Was stillvery SOB  She was seen in pulm after. I saw her back on Feb 1  She was doing much better   [facility] is now home after being at [facility] for rehab. Breathing is OK  No CP     Current Outpatient Prescriptions  Medication Sig Dispense Refill  . albuterol (PROVENTIL HFA;VENTOLIN HFA) 108 (90 BASE) MCG/ACT inhaler Inhale 2 puffs into the lungs every 4 (four) hours as needed for wheezing or shortness of breath. 1 Inhaler 0  . amLODipine (NORVASC) 2.5 MG tablet Take 1 tablet (2.5 mg total) by mouth daily. 30 tablet 6  . ARTIFICIAL TEAR OP Place 1 drop into both eyes 2 (two) times daily.    Marland Kitchen aspirin 81 MG tablet Take 81 mg by mouth daily.    Marland Kitchen b complex vitamins tablet Take 1 tablet by mouth daily.    Marland Kitchen BYSTOLIC 10 MG tablet Take 10 mg by mouth daily.  0  . chlorpheniramine-HYDROcodone (TUSSIONEX) 10-8 MG/5ML LQCR Take 5 mLs by mouth every 12 (twelve) hours. 115 mL 0  . co-enzyme Q-10 50 MG capsule Take 100 mg by mouth daily.     . colesevelam (WELCHOL) 625 MG tablet Take 1,875 mg by mouth 2 (two) times daily with a meal. With Lunch and Dinner    . donepezil (ARICEPT) 10 MG tablet Take 1 tablet (10 mg total) by mouth daily. 30 tablet 0  . furosemide (LASIX) 40 MG tablet Take 40 mg by mouth as needed for  fluid or edema.     Marland Kitchen losartan (COZAAR) 50 MG tablet Take 1 tablet (50 mg total) by mouth daily. 30 tablet 0  . memantine (NAMENDA XR) 28 MG CP24 24 hr capsule Take 1 capsule (28 mg total) by mouth daily. 30 capsule 0  . memantine (NAMENDA XR) 7 MG CP24 24 hr capsule 1 capsule daily for one week, then take 2 capsules daily for one week, then take 3 capsules daily for one week, then take 4 capsules daily 120 capsule 1  . Multiple Vitamins-Minerals (ALIVE WOMENS 50+ PO) Take by mouth daily.    Marland Kitchen saccharomyces boulardii (FLORASTOR) 250 MG capsule Take 1 capsule (250 mg total) by mouth 2 (two) times daily. (Patient taking differently: Take 250 mg by mouth daily. ) 30 capsule 0  . Vitamin D, Ergocalciferol, (DRISDOL) 50000 UNITS CAPS Take 50,000 Units by mouth every 7 (seven) days. On Fridays     No current facility-administered medications for this visit.    Allergies:   Lidocaine   Past Medical History  Diagnosis Date  . Hyperlipidemia   . Hypertension   . Carotid artery occlusion   . Fall at home Sept. 2013  . Memory disorder  10/24/2014    Past Surgical History  Procedure Laterality Date  . Carotid endarterectomy      left CEA  . Tonsillectomy    . Appendectomy    . Breast reduction surgery    . Skin cancer removal    . Cataract extraction Bilateral   . Colonoscopy  2015  . Eye surgery      cataracts removed, bilaterally      Social History:  The patient  reports that she quit smoking about 20 years ago. Her smoking use included Cigarettes. She quit after 14 years of use. She has never used smokeless tobacco. She reports that she does not drink alcohol or use illicit drugs.   Family History:  The patient's family history includes Cancer (age of onset: 84) in her mother; Cancer (age of onset: 35) in her maternal aunt; Dementia in her brother; Heart attack in her father; Heart disease in her father; Heart failure in her maternal grandmother; Hypertension in her father. There is no  history of Stroke.    ROS:  Please see the history of present illness. All other systems are reviewed and  Negative to the above problem except as noted.    PHYSICAL EXAM: VS:  BP 110/68 mmHg  Pulse 77  Ht 5\' 3"  (1.6 m)  Wt 164 lb 12.8 oz (74.753 kg)  BMI 29.20 kg/m2  SpO2 95%  GEN: Well nourished, well developed, in no acute distress HEENT: normal Neck: no JVD, carotid bruits, or masses Cardiac: RRR; no murmurs, rubs, or gallops,no edema  Respiratory:  clear to auscultation bilaterally, normal work of breathing GI: soft, nontender, nondistended, + BS  No hepatomegaly  MS: no deformity Moving all extremities   Skin: warm and dry, no rash Neuro:  Strength and sensation are intact Psych: euthymic mood, full affect   EKG:  EKG is not ordered today.   Lipid Panel    Component Value Date/Time   CHOL 195 12/01/2014 1210   TRIG 83 12/01/2014 1210   HDL 56 12/01/2014 1210   CHOLHDL 3.5 12/01/2014 1210   VLDL 17 12/01/2014 1210   LDLCALC 122* 12/01/2014 1210      Wt Readings from Last 3 Encounters:  01/29/15 164 lb 12.8 oz (74.753 kg)  01/18/15 164 lb 3.2 oz (74.481 kg)  01/01/15 160 lb 6.4 oz (72.757 kg)      ASSESSMENT AND PLAN: 1.  Dyspnea  Improved  Volume status looks good  I would not make any changes  2.  Nasal drainage  WIll Give Rx for atrovent nasal spray for this  See if works  3  HTN  Adeq control    Encouraged her to continue PT  Stay active    F/U later this year      Current medicines are reviewed at length with the patient today.  The patient does not have concerns regarding medicines.    Signed, Dorris Carnes, MD  01/29/2015 9:39 AM    [address], [place], [place]  [address] Phone: [phone]; Fax: [phone]

## 2015-01-29 ENCOUNTER — Encounter: Payer: Self-pay | Admitting: Internal Medicine

## 2015-01-29 ENCOUNTER — Ambulatory Visit (INDEPENDENT_AMBULATORY_CARE_PROVIDER_SITE_OTHER): Payer: Medicare Other | Admitting: Internal Medicine

## 2015-01-29 VITALS — BP 110/68 | HR 77 | Ht 63.0 in | Wt 164.8 lb

## 2015-01-29 DIAGNOSIS — R0600 Dyspnea, unspecified: Secondary | ICD-10-CM

## 2015-01-29 MED ORDER — IPRATROPIUM BROMIDE 0.03 % NA SOLN: 2.0000 | Freq: Two times a day (BID) | NASAL | Status: DC

## 2015-01-29 NOTE — Patient Instructions (Addendum)
Your physician has recommended you make the following change in your medication:  1.) start Atrovent Nasal Spray 2 sprays each nostril every twelve hours.  Your physician wants you to follow-up in 6 months with Dr. Harrington Challenger.   You will receive a reminder letter in the mail two months in advance. If you don't receive a letter, please call our office to schedule the follow-up appointment.

## 2015-01-30 DIAGNOSIS — I5031 Acute diastolic (congestive) heart failure: Secondary | ICD-10-CM | POA: Diagnosis not present

## 2015-01-30 DIAGNOSIS — Z720 Tobacco use: Secondary | ICD-10-CM | POA: Diagnosis not present

## 2015-01-30 DIAGNOSIS — F0390 Unspecified dementia without behavioral disturbance: Secondary | ICD-10-CM | POA: Diagnosis not present

## 2015-01-30 DIAGNOSIS — R269 Unspecified abnormalities of gait and mobility: Secondary | ICD-10-CM | POA: Diagnosis not present

## 2015-01-30 DIAGNOSIS — I10 Essential (primary) hypertension: Secondary | ICD-10-CM | POA: Diagnosis not present

## 2015-01-30 DIAGNOSIS — I2510 Atherosclerotic heart disease of native coronary artery without angina pectoris: Secondary | ICD-10-CM | POA: Diagnosis not present

## 2015-01-31 DIAGNOSIS — Z720 Tobacco use: Secondary | ICD-10-CM | POA: Diagnosis not present

## 2015-01-31 DIAGNOSIS — I5031 Acute diastolic (congestive) heart failure: Secondary | ICD-10-CM | POA: Diagnosis not present

## 2015-01-31 DIAGNOSIS — F0390 Unspecified dementia without behavioral disturbance: Secondary | ICD-10-CM | POA: Diagnosis not present

## 2015-01-31 DIAGNOSIS — R269 Unspecified abnormalities of gait and mobility: Secondary | ICD-10-CM | POA: Diagnosis not present

## 2015-01-31 DIAGNOSIS — I2510 Atherosclerotic heart disease of native coronary artery without angina pectoris: Secondary | ICD-10-CM | POA: Diagnosis not present

## 2015-01-31 DIAGNOSIS — I10 Essential (primary) hypertension: Secondary | ICD-10-CM | POA: Diagnosis not present

## 2015-02-01 ENCOUNTER — Other Ambulatory Visit: Payer: Self-pay | Admitting: *Deleted

## 2015-02-01 DIAGNOSIS — I5031 Acute diastolic (congestive) heart failure: Secondary | ICD-10-CM | POA: Diagnosis not present

## 2015-02-01 DIAGNOSIS — R269 Unspecified abnormalities of gait and mobility: Secondary | ICD-10-CM | POA: Diagnosis not present

## 2015-02-01 DIAGNOSIS — Z720 Tobacco use: Secondary | ICD-10-CM | POA: Diagnosis not present

## 2015-02-01 DIAGNOSIS — I10 Essential (primary) hypertension: Secondary | ICD-10-CM | POA: Diagnosis not present

## 2015-02-01 DIAGNOSIS — I2510 Atherosclerotic heart disease of native coronary artery without angina pectoris: Secondary | ICD-10-CM | POA: Diagnosis not present

## 2015-02-01 DIAGNOSIS — F0390 Unspecified dementia without behavioral disturbance: Secondary | ICD-10-CM | POA: Diagnosis not present

## 2015-02-01 MED ORDER — LOSARTAN POTASSIUM 50 MG PO TABS: ORAL_TABLET | ORAL | Status: DC

## 2015-02-01 MED ORDER — FUROSEMIDE 40 MG PO TABS: ORAL_TABLET | ORAL | Status: DC

## 2015-02-01 MED ORDER — NEBIVOLOL HCL 10 MG PO TABS: ORAL_TABLET | ORAL | Status: DC

## 2015-02-01 NOTE — Telephone Encounter (Signed)
[facility] 

## 2015-02-02 DIAGNOSIS — F0390 Unspecified dementia without behavioral disturbance: Secondary | ICD-10-CM | POA: Diagnosis not present

## 2015-02-02 DIAGNOSIS — I5031 Acute diastolic (congestive) heart failure: Secondary | ICD-10-CM | POA: Diagnosis not present

## 2015-02-02 DIAGNOSIS — R269 Unspecified abnormalities of gait and mobility: Secondary | ICD-10-CM | POA: Diagnosis not present

## 2015-02-02 DIAGNOSIS — I2510 Atherosclerotic heart disease of native coronary artery without angina pectoris: Secondary | ICD-10-CM | POA: Diagnosis not present

## 2015-02-02 DIAGNOSIS — Z720 Tobacco use: Secondary | ICD-10-CM | POA: Diagnosis not present

## 2015-02-02 DIAGNOSIS — I10 Essential (primary) hypertension: Secondary | ICD-10-CM | POA: Diagnosis not present

## 2015-02-05 DIAGNOSIS — Z720 Tobacco use: Secondary | ICD-10-CM | POA: Diagnosis not present

## 2015-02-05 DIAGNOSIS — I10 Essential (primary) hypertension: Secondary | ICD-10-CM | POA: Diagnosis not present

## 2015-02-05 DIAGNOSIS — I5031 Acute diastolic (congestive) heart failure: Secondary | ICD-10-CM | POA: Diagnosis not present

## 2015-02-05 DIAGNOSIS — R269 Unspecified abnormalities of gait and mobility: Secondary | ICD-10-CM | POA: Diagnosis not present

## 2015-02-05 DIAGNOSIS — F0390 Unspecified dementia without behavioral disturbance: Secondary | ICD-10-CM | POA: Diagnosis not present

## 2015-02-05 DIAGNOSIS — I2510 Atherosclerotic heart disease of native coronary artery without angina pectoris: Secondary | ICD-10-CM | POA: Diagnosis not present

## 2015-02-07 DIAGNOSIS — I2510 Atherosclerotic heart disease of native coronary artery without angina pectoris: Secondary | ICD-10-CM | POA: Diagnosis not present

## 2015-02-07 DIAGNOSIS — Z720 Tobacco use: Secondary | ICD-10-CM | POA: Diagnosis not present

## 2015-02-07 DIAGNOSIS — I10 Essential (primary) hypertension: Secondary | ICD-10-CM | POA: Diagnosis not present

## 2015-02-07 DIAGNOSIS — I5031 Acute diastolic (congestive) heart failure: Secondary | ICD-10-CM | POA: Diagnosis not present

## 2015-02-07 DIAGNOSIS — F0390 Unspecified dementia without behavioral disturbance: Secondary | ICD-10-CM | POA: Diagnosis not present

## 2015-02-07 DIAGNOSIS — R269 Unspecified abnormalities of gait and mobility: Secondary | ICD-10-CM | POA: Diagnosis not present

## 2015-02-12 DIAGNOSIS — I10 Essential (primary) hypertension: Secondary | ICD-10-CM | POA: Diagnosis not present

## 2015-02-12 DIAGNOSIS — I2510 Atherosclerotic heart disease of native coronary artery without angina pectoris: Secondary | ICD-10-CM | POA: Diagnosis not present

## 2015-02-12 DIAGNOSIS — R269 Unspecified abnormalities of gait and mobility: Secondary | ICD-10-CM | POA: Diagnosis not present

## 2015-02-12 DIAGNOSIS — Z720 Tobacco use: Secondary | ICD-10-CM | POA: Diagnosis not present

## 2015-02-12 DIAGNOSIS — I5031 Acute diastolic (congestive) heart failure: Secondary | ICD-10-CM | POA: Diagnosis not present

## 2015-02-12 DIAGNOSIS — F0390 Unspecified dementia without behavioral disturbance: Secondary | ICD-10-CM | POA: Diagnosis not present

## 2015-02-15 ENCOUNTER — Ambulatory Visit (INDEPENDENT_AMBULATORY_CARE_PROVIDER_SITE_OTHER): Payer: Medicare Other | Admitting: Nurse Practitioner

## 2015-02-15 ENCOUNTER — Encounter: Payer: Self-pay | Admitting: Nurse Practitioner

## 2015-02-15 VITALS — BP 126/80 | HR 66 | Temp 98.2°F | Resp 12 | Ht 63.5 in | Wt 168.0 lb

## 2015-02-15 DIAGNOSIS — I10 Essential (primary) hypertension: Secondary | ICD-10-CM | POA: Diagnosis not present

## 2015-02-15 DIAGNOSIS — J449 Chronic obstructive pulmonary disease, unspecified: Secondary | ICD-10-CM

## 2015-02-15 DIAGNOSIS — R413 Other amnesia: Secondary | ICD-10-CM

## 2015-02-15 DIAGNOSIS — I5032 Chronic diastolic (congestive) heart failure: Secondary | ICD-10-CM

## 2015-02-15 DIAGNOSIS — E559 Vitamin D deficiency, unspecified: Secondary | ICD-10-CM | POA: Insufficient documentation

## 2015-02-15 HISTORY — DX: Vitamin D deficiency, unspecified: E55.9

## 2015-02-15 MED ORDER — TETANUS-DIPHTH-ACELL PERTUSSIS 5-2.5-18.5 LF-MCG/0.5 IM SUSP: 0.5000 mL | Freq: Once | INTRAMUSCULAR | Status: DC

## 2015-02-15 MED ORDER — MEMANTINE HCL-DONEPEZIL HCL ER 28-10 MG PO CP24: 1.0000 | ORAL_CAPSULE | Freq: Every day | ORAL | Status: DC

## 2015-02-15 NOTE — Patient Instructions (Signed)
Namzaric sent to pharmacy-- this Replaces namenda and aricept (stop these medications when you start Namzaric)  Call about bone density scan  Follow up in 3 months with Dr Mariea Clonts-- lab work prior to visit

## 2015-02-15 NOTE — Progress Notes (Signed)
Patient ID: Virginia [name], female   DOB: 05-24-1936, 79 y.o.   MRN: [id number]    PCP: Lauree Chandler, NP  Allergies  Allergen Reactions  . Lidocaine Anaphylaxis, Swelling, Rash and Other (See Comments)    Any of the " [place] "    Chief Complaint  Patient presents with  . Annual Exam    Yearly check-up, no recent labs, last EKG 12/11/14 (Cardiology). Not fasting today   . MMSE    25/30, failed clock drawing      HPI: Patient is a 79 y.o. female seen in the office today for extended visit. No acute concerns at this time. Here with brother who helps with her medications.   Screenings: Colon Cancer- up to date  Osteoporosis- Dexa Scan unsure of last scan  Vaccines Up to date on: influenza, pneumococcal   Smoking status: nonsmoker Alcohol use: no  Dentist: every 6 months Ophthalmologist: yearly  Exercise regimen: none-- meets with OT Diet: attempts to eat healthy  Functional Status of ADLs: Toileting- independently  Bathing-independently  Dressing-independently   Advanced Directive information Does patient have an advance directive?: Yes, Type of Advance Directive: Healthcare Power of [place];Living will Review of Systems:  Review of Systems  Constitutional: Negative for activity change, appetite change, fatigue and unexpected weight change.  HENT: Negative for congestion and hearing loss.   Eyes: Negative.   Respiratory: Negative for cough and shortness of breath.   Cardiovascular: Negative for chest pain, palpitations and leg swelling.  Gastrointestinal: Negative for abdominal pain, diarrhea and constipation.  Genitourinary: Negative for dysuria and difficulty urinating.  Musculoskeletal: Negative for myalgias and arthralgias.  Skin: Negative for color change and wound.  Neurological: Negative for dizziness and weakness.  Psychiatric/Behavioral: Positive for confusion. Negative for behavioral problems and agitation.    Past Medical History    Diagnosis Date  . Hyperlipidemia   . Hypertension   . Carotid artery occlusion   . Fall at home Sept. 2013  . Memory disorder 10/24/2014   Past Surgical History  Procedure Laterality Date  . Carotid endarterectomy      left CEA  . Tonsillectomy    . Appendectomy    . Breast reduction surgery    . Skin cancer removal    . Cataract extraction Bilateral   . Colonoscopy  2015  . Eye surgery      cataracts removed, bilaterally    Social History:   reports that she quit smoking about 20 years ago. Her smoking use included Cigarettes. She quit after 14 years of use. She has never used smokeless tobacco. She reports that she does not drink alcohol or use illicit drugs.  Family History  Problem Relation Age of Onset  . Heart disease Father     Before age 24  . Hypertension Father   . Heart attack Father   . Cancer Mother 4    Brain  . Dementia Brother   . Heart failure Maternal Grandmother   . Stroke Neg Hx   . Cancer Maternal Aunt 61    breast cancer    Medications: Patient's Medications  New Prescriptions   No medications on file  Previous Medications   ALBUTEROL (PROVENTIL HFA;VENTOLIN HFA) 108 (90 BASE) MCG/ACT INHALER    Inhale 2 puffs into the lungs every 4 (four) hours as needed for wheezing or shortness of breath.   AMLODIPINE (NORVASC) 2.5 MG TABLET    Take 1 tablet (2.5 mg total) by mouth daily.  ARTIFICIAL TEAR OP    Place 1 drop into both eyes 2 (two) times daily.   ASPIRIN 81 MG TABLET    Take 81 mg by mouth daily.   B COMPLEX VITAMINS TABLET    Take 1 tablet by mouth daily.   CHLORPHENIRAMINE-HYDROCODONE (TUSSIONEX) 10-8 MG/5ML LQCR    Take 5 mLs by mouth every 12 (twelve) hours.   CO-ENZYME Q-10 50 MG CAPSULE    Take 100 mg by mouth daily.    COLESEVELAM (WELCHOL) 625 MG TABLET    Take 1,875 mg by mouth 2 (two) times daily with a meal. With Lunch and Dinner   DONEPEZIL (ARICEPT) 10 MG TABLET    Take 1 tablet (10 mg total) by mouth daily.   FUROSEMIDE  (LASIX) 40 MG TABLET    Take one tablet by mouth once daily as needed for swelling.   IPRATROPIUM (ATROVENT) 0.03 % NASAL SPRAY    Place 2 sprays into both nostrils every 12 (twelve) hours.   LOSARTAN (COZAAR) 50 MG TABLET    Take one tablet by mouth once daily for blood pressure   MEMANTINE (NAMENDA XR) 28 MG CP24 24 HR CAPSULE    Take 1 capsule (28 mg total) by mouth daily.   MULTIPLE VITAMINS-MINERALS (ALIVE WOMENS 50+ PO)    Take by mouth daily.   NEBIVOLOL (BYSTOLIC) 10 MG TABLET    Take one tablet by mouth once daily for blood pressure   SACCHAROMYCES BOULARDII (FLORASTOR) 250 MG CAPSULE    Take 1 capsule (250 mg total) by mouth 2 (two) times daily.   VITAMIN D, ERGOCALCIFEROL, (DRISDOL) 50000 UNITS CAPS    Take 50,000 Units by mouth every 7 (seven) days. On Fridays  Modified Medications   Modified Medication Previous Medication   TDAP (BOOSTRIX) 5-2.5-18.5 LF-MCG/0.5 INJECTION Tdap (BOOSTRIX) 5-2.5-18.5 LF-MCG/0.5 injection      Inject 0.5 mLs into the muscle once.    Inject 0.5 mLs into the muscle once.  Discontinued Medications   MEMANTINE (NAMENDA XR) 7 MG CP24 24 HR CAPSULE    1 capsule daily for one week, then take 2 capsules daily for one week, then take 3 capsules daily for one week, then take 4 capsules daily     Physical Exam:  Filed Vitals:   02/15/15 1133  BP: 126/80  Pulse: 66  Temp: 98.2 F (36.8 C)  TempSrc: Oral  Resp: 12  Height: 5' 3.5" (1.613 m)  Weight: 168 lb (76.204 kg)  SpO2: 96%    Physical Exam  Constitutional: She is oriented to person, place, and time. She appears well-developed and well-nourished. No distress.  HENT:  Head: Normocephalic and atraumatic.  Mouth/Throat: Oropharynx is clear and moist. No oropharyngeal exudate.  Eyes: Conjunctivae are normal. Pupils are equal, round, and reactive to light.  Neck: Normal range of motion. Neck supple.  Cardiovascular: Normal rate, regular rhythm and normal heart sounds.   Pulmonary/Chest: Effort  normal and breath sounds normal.  Abdominal: Soft. Bowel sounds are normal.  Musculoskeletal: She exhibits no edema or tenderness.       Right shoulder: She exhibits decreased range of motion.  Neurological: She is alert and oriented to person, place, and time.  Skin: Skin is warm and dry. She is not diaphoretic.  Psychiatric: She has a normal mood and affect.    Labs reviewed: Basic Metabolic Panel:  Recent Labs  12/07/14 1045  12/09/14 0435 12/11/14 1622 12/29/14 1242  NA  --   < > 137 133* 140  K  --   < > 3.7 4.5 3.9  CL  --   < > 85* 91* 104  CO2  --   < > 45* 35* 25  GLUCOSE  --   < > 104* 160* 96  BUN  --   < > 23 29* 20  CREATININE  --   < > 0.85 1.4* 1.08  CALCIUM  --   < > 8.3* 8.2* 9.0  MG  --   --  2.0  --   --   TSH 0.946  --   --   --  1.00  < > = values in this interval not displayed. Liver Function Tests:  Recent Labs  12/03/14 0500 12/07/14 0550 12/29/14 1242  AST 30 19 22   ALT 53* 26 17  ALKPHOS 73 44 68  BILITOT 0.7 0.8 0.9  PROT 6.0 5.4* 6.4  ALBUMIN 2.9* 2.5* 3.4*   No results for input(s): LIPASE, AMYLASE in the last 8760 hours. No results for input(s): AMMONIA in the last 8760 hours. CBC:  Recent Labs  12/06/14 0540 12/07/14 0550 12/29/14 1242  WBC 11.6* 12.8* 9.4  NEUTROABS  --   --  6.3  HGB 12.8 12.5 13.5  HCT 41.3 41.4 40.1  MCV 89.8 92.0 85.4  PLT 305 315 259.0   Lipid Panel:  Recent Labs  12/01/14 1210  CHOL 195  HDL 56  LDLCALC 122*  TRIG 83  CHOLHDL 3.5   TSH:  Recent Labs  12/07/14 1045 12/29/14 1242  TSH 0.946 1.00   A1C: Lab Results  Component Value Date   HGBA1C 5.8* 12/02/2014     Assessment/Plan 1. Essential hypertension -blood pressure controlled on current regimen  2. Chronic diastolic congestive heart failure -following with cardiology, CHF is stable at this tie -conts on betablocker, cozaar, and lasix - Basic metabolic panel; Future  3. COPD GOLD IV -COPD remains stable, recent  follow up with pulmonary.   4. Memory disorder -MMSE of 25/30 -brother and sister helping pt with medications and iADLs -to stop aricept and namenda and start namzaric as directed  - Memantine HCl-Donepezil HCl (NAMZARIC) 28-10 MG CP24; Take 1 tablet by mouth daily.  Dispense: 30 capsule; Refill: 4  5. Vitamin D deficiency -currently on Vit D 50,000 units, will follow up  Vitamin D, 25-hydroxy; before next visit  6. Health Maintenance  - will follow up on when last dexa scan was. Up to date on colonoscopy.  -encouraged to increase activity (does not do much at all) and eat a heart healthy diet.   Follow up in 3 months with Dr Mariea Clonts with blood work prior to visit.

## 2015-02-15 NOTE — Progress Notes (Signed)
Failed clock drawing  

## 2015-02-16 DIAGNOSIS — F0390 Unspecified dementia without behavioral disturbance: Secondary | ICD-10-CM | POA: Diagnosis not present

## 2015-02-16 DIAGNOSIS — Z720 Tobacco use: Secondary | ICD-10-CM | POA: Diagnosis not present

## 2015-02-16 DIAGNOSIS — I2510 Atherosclerotic heart disease of native coronary artery without angina pectoris: Secondary | ICD-10-CM | POA: Diagnosis not present

## 2015-02-16 DIAGNOSIS — R269 Unspecified abnormalities of gait and mobility: Secondary | ICD-10-CM | POA: Diagnosis not present

## 2015-02-16 DIAGNOSIS — I5031 Acute diastolic (congestive) heart failure: Secondary | ICD-10-CM | POA: Diagnosis not present

## 2015-02-16 DIAGNOSIS — I10 Essential (primary) hypertension: Secondary | ICD-10-CM | POA: Diagnosis not present

## 2015-02-19 ENCOUNTER — Telehealth: Payer: Self-pay | Admitting: Nurse Practitioner

## 2015-02-19 NOTE — Telephone Encounter (Signed)
Patients son called this afternoon and stated that he was supposed to call back with patients last Bone Density information. He said he called the insurance company and was told they do not itemize their claims so they are not able to find that information.Virginia [name] He wanted to let you know that he did try but he was not successful but he said he feels it has been long enough that he feels she is due for one. But he wanted you to know that he did try.

## 2015-02-20 ENCOUNTER — Other Ambulatory Visit: Payer: Self-pay | Admitting: Nurse Practitioner

## 2015-02-20 DIAGNOSIS — E2839 Other primary ovarian failure: Secondary | ICD-10-CM

## 2015-02-20 NOTE — Telephone Encounter (Signed)
Order placed for bone density  

## 2015-02-21 DIAGNOSIS — F0390 Unspecified dementia without behavioral disturbance: Secondary | ICD-10-CM | POA: Diagnosis not present

## 2015-02-21 DIAGNOSIS — I2510 Atherosclerotic heart disease of native coronary artery without angina pectoris: Secondary | ICD-10-CM | POA: Diagnosis not present

## 2015-02-21 DIAGNOSIS — I5031 Acute diastolic (congestive) heart failure: Secondary | ICD-10-CM | POA: Diagnosis not present

## 2015-02-21 DIAGNOSIS — R269 Unspecified abnormalities of gait and mobility: Secondary | ICD-10-CM | POA: Diagnosis not present

## 2015-02-21 DIAGNOSIS — Z720 Tobacco use: Secondary | ICD-10-CM | POA: Diagnosis not present

## 2015-02-21 DIAGNOSIS — I10 Essential (primary) hypertension: Secondary | ICD-10-CM | POA: Diagnosis not present

## 2015-02-23 DIAGNOSIS — Z720 Tobacco use: Secondary | ICD-10-CM | POA: Diagnosis not present

## 2015-02-23 DIAGNOSIS — F0390 Unspecified dementia without behavioral disturbance: Secondary | ICD-10-CM | POA: Diagnosis not present

## 2015-02-23 DIAGNOSIS — I2510 Atherosclerotic heart disease of native coronary artery without angina pectoris: Secondary | ICD-10-CM | POA: Diagnosis not present

## 2015-02-23 DIAGNOSIS — I10 Essential (primary) hypertension: Secondary | ICD-10-CM | POA: Diagnosis not present

## 2015-02-23 DIAGNOSIS — R269 Unspecified abnormalities of gait and mobility: Secondary | ICD-10-CM | POA: Diagnosis not present

## 2015-02-23 DIAGNOSIS — I5031 Acute diastolic (congestive) heart failure: Secondary | ICD-10-CM | POA: Diagnosis not present

## 2015-02-28 ENCOUNTER — Telehealth: Payer: Self-pay | Admitting: Internal Medicine

## 2015-02-28 DIAGNOSIS — Z720 Tobacco use: Secondary | ICD-10-CM | POA: Diagnosis not present

## 2015-02-28 DIAGNOSIS — R269 Unspecified abnormalities of gait and mobility: Secondary | ICD-10-CM | POA: Diagnosis not present

## 2015-02-28 DIAGNOSIS — I2510 Atherosclerotic heart disease of native coronary artery without angina pectoris: Secondary | ICD-10-CM | POA: Diagnosis not present

## 2015-02-28 DIAGNOSIS — I10 Essential (primary) hypertension: Secondary | ICD-10-CM | POA: Diagnosis not present

## 2015-02-28 DIAGNOSIS — F0390 Unspecified dementia without behavioral disturbance: Secondary | ICD-10-CM | POA: Diagnosis not present

## 2015-02-28 DIAGNOSIS — I5031 Acute diastolic (congestive) heart failure: Secondary | ICD-10-CM | POA: Diagnosis not present

## 2015-02-28 NOTE — Telephone Encounter (Signed)
Spoke with daughter and brother several times today Patient had multiple low readings but does use wrist cuff.  Was seen by OT today and wrist cuff had blood pressure at 95/45 and OT blood pressure 112/60. HR 75 and O2 sat 98% Patient had no blood pressure medications today Per daughter swelling in legs better but belly seemed to be "bloated" and may be a little more short of breath Patient having decreased energy Discussed with Tera Helper NP and will have patient hold Norvasc, start taking Furosemide daily instead of every other day, decrease salt intake, and OK to give 1/2 of the Losartan and 1/2 of Bystolic today  Spoke with brother [name] who stays with patient and gave recommendations Also advised to get [employer] arm cuff blood pressure machine and start to monitor blood pressure. Start getting daily weights on patient every am Daughter wanted parameters on blood pressure and when medications should be held. Will forward to Dr Harrington Challenger for review

## 2015-02-28 NOTE — Telephone Encounter (Signed)
New message      Pt c/o BP issue: STAT if pt c/o blurred vision, one-sided weakness or slurred speech  1. What are your last 5 BP readings?83/42, 87/42, 95/53 2. Are you having any other symptoms (ex. Dizziness, headache, blurred vision, passed out)? Headache yesterday, fatigue  3. What is your BP issue? Pt's bp is low.

## 2015-03-01 NOTE — Telephone Encounter (Signed)
Discontinued norvasc from patient's med list per notes below.

## 2015-03-02 DIAGNOSIS — I10 Essential (primary) hypertension: Secondary | ICD-10-CM | POA: Diagnosis not present

## 2015-03-02 DIAGNOSIS — F0390 Unspecified dementia without behavioral disturbance: Secondary | ICD-10-CM | POA: Diagnosis not present

## 2015-03-02 DIAGNOSIS — Z720 Tobacco use: Secondary | ICD-10-CM | POA: Diagnosis not present

## 2015-03-02 DIAGNOSIS — I5031 Acute diastolic (congestive) heart failure: Secondary | ICD-10-CM | POA: Diagnosis not present

## 2015-03-02 DIAGNOSIS — R269 Unspecified abnormalities of gait and mobility: Secondary | ICD-10-CM | POA: Diagnosis not present

## 2015-03-02 DIAGNOSIS — I2510 Atherosclerotic heart disease of native coronary artery without angina pectoris: Secondary | ICD-10-CM | POA: Diagnosis not present

## 2015-03-04 NOTE — Telephone Encounter (Signed)
Make sure patinet has f/u exam in a couple wks

## 2015-03-05 NOTE — Telephone Encounter (Signed)
Sent message to [facility] to call patient and schedule with Dr. Harrington Challenger or extender.

## 2015-03-07 ENCOUNTER — Other Ambulatory Visit: Payer: Self-pay | Admitting: *Deleted

## 2015-03-07 MED ORDER — COLESEVELAM HCL 625 MG PO TABS: 1875.0000 mg | ORAL_TABLET | Freq: Two times a day (BID) | ORAL | Status: DC

## 2015-03-12 NOTE — Telephone Encounter (Signed)
Pt has appointment 4/25.

## 2015-03-26 ENCOUNTER — Encounter: Payer: Self-pay | Admitting: Internal Medicine

## 2015-03-26 ENCOUNTER — Ambulatory Visit (INDEPENDENT_AMBULATORY_CARE_PROVIDER_SITE_OTHER): Payer: Medicare Other | Admitting: Internal Medicine

## 2015-03-26 VITALS — BP 110/46 | HR 71 | Ht 62.5 in | Wt 170.2 lb

## 2015-03-26 DIAGNOSIS — R609 Edema, unspecified: Secondary | ICD-10-CM

## 2015-03-26 DIAGNOSIS — R0602 Shortness of breath: Secondary | ICD-10-CM

## 2015-03-26 LAB — BASIC METABOLIC PANEL
BUN: 20 mg/dL (ref 6–23)
CALCIUM: 9.2 mg/dL (ref 8.4–10.5)
CHLORIDE: 105 meq/L (ref 96–112)
CO2: 32 meq/L (ref 19–32)
Creatinine, Ser: 0.96 mg/dL (ref 0.40–1.20)
GFR: 59.89 mL/min — ABNORMAL LOW (ref >60.00)
GLUCOSE: 113 mg/dL — AB (ref 70–99)
POTASSIUM: 4.1 meq/L (ref 3.5–5.1)
Sodium: 139 mEq/L (ref 135–145)

## 2015-03-26 LAB — BRAIN NATRIURETIC PEPTIDE: Pro B Natriuretic peptide (BNP): 207 pg/mL — ABNORMAL HIGH (ref 0.0–100.0)

## 2015-03-26 MED ORDER — FUROSEMIDE 40 MG PO TABS: 60.0000 mg | ORAL_TABLET | ORAL | Status: DC

## 2015-03-26 NOTE — Progress Notes (Signed)
Cardiology Office Note   Date:  03/26/2015   ID:  MICHAYLA MCNEIL, DOB March 21, 1936, MRN [id number]  PCP:  Lauree Chandler, NP  Cardiologist:   Dorris Carnes, MD   No chief complaint on file.     History of Present Illness: Virginia [name] is a 79 y.o. female with a history of PVOD (s/o L CEA), HTN, diastolic CHF and tob use.She had a pneumonia earlie this summber    Myoview last year showed no ischemia   I saw her for the first time earlier ths winter for dyspnea. She underwent myoview testing This was neg for ischemia   I saw her in clinic at thie end of feb  She was doing very well at that time.   Since seen her breathing has been hindered by allergies  Has some drainage  Did say that ipratropium NS helped with runny nose while eating  Will run at other times though She does have increased LE edema  Is eating out at restaurants more.  Takes lasix 40 qod  Not sure she sees much of a diuretic rsponse.   Current Outpatient Prescriptions  Medication Sig Dispense Refill  . albuterol (PROVENTIL HFA;VENTOLIN HFA) 108 (90 BASE) MCG/ACT inhaler Inhale 2 puffs into the lungs every 4 (four) hours as needed for wheezing or shortness of breath. 1 Inhaler 0  . ARTIFICIAL TEAR OP Place 2 drops into both eyes daily.     Marland Kitchen aspirin 81 MG tablet Take 81 mg by mouth daily.    Marland Kitchen b complex vitamins tablet Take 1 tablet by mouth daily.    Marland Kitchen co-enzyme Q-10 50 MG capsule Take 100 mg by mouth daily.     . colesevelam (WELCHOL) 625 MG tablet Take 3 tablets (1,875 mg total) by mouth 2 (two) times daily with a meal. With Lunch and Dinner 180 tablet 3  . furosemide (LASIX) 40 MG tablet Take 40 mg by mouth every other day.    . ipratropium (ATROVENT) 0.03 % nasal spray Place 2 sprays into both nostrils every 12 (twelve) hours. 30 mL 12  . losartan (COZAAR) 50 MG tablet Take one tablet by mouth once daily for blood pressure 30 tablet 3  . Memantine HCl-Donepezil HCl (NAMZARIC) 28-10 MG CP24 Take 1 tablet by  mouth daily. 30 capsule 4  . Multiple Vitamins-Minerals (ALIVE WOMENS 50+ PO) Take by mouth daily.    . nebivolol (BYSTOLIC) 10 MG tablet Take one tablet by mouth once daily for blood pressure 30 tablet 3  . saccharomyces boulardii (FLORASTOR) 250 MG capsule Take 250 mg by mouth 2 (two) times a week.    . Vitamin D, Ergocalciferol, (DRISDOL) 50000 UNITS CAPS Take 50,000 Units by mouth every 7 (seven) days. On Fridays     No current facility-administered medications for this visit.    Allergies:   Lidocaine   Past Medical History  Diagnosis Date  . Hyperlipidemia   . Hypertension   . Carotid artery occlusion   . Fall at home Sept. 2013  . Memory disorder 10/24/2014    Past Surgical History  Procedure Laterality Date  . Carotid endarterectomy      left CEA  . Tonsillectomy    . Appendectomy    . Breast reduction surgery    . Skin cancer removal    . Cataract extraction Bilateral   . Colonoscopy  2015  . Eye surgery      cataracts removed, bilaterally      Social History:  The patient  reports that she quit smoking about 20 years ago. Her smoking use included Cigarettes. She quit after 14 years of use. She has never used smokeless tobacco. She reports that she does not drink alcohol or use illicit drugs.   Family History:  The patient's family history includes Cancer (age of onset: 68) in her mother; Cancer (age of onset: 20) in her maternal aunt; Dementia in her brother; Heart attack in her father; Heart disease in her father; Heart failure in her maternal grandmother; Hypertension in her father. There is no history of Stroke.    ROS:  Please see the history of present illness. All other systems are reviewed and  Negative to the above problem except as noted.    PHYSICAL EXAM: VS:  BP 110/46 mmHg  Pulse 71  Ht 5' 2.5" (1.588 m)  Wt 170 lb 3.2 oz (77.202 kg)  BMI 30.61 kg/m2  GEN: Well nourished, well developed, in no acute distress HEENT: normal Neck: no JVD, carotid  bruits, or masses Cardiac: RRR; no murmurs, rubs, or gallops  1+ edema  Respiratory:  clear to auscultation bilaterally, normal work of breathing GI: soft, nontender, nondistended, + BS  No hepatomegaly  MS: no deformity Moving all extremities   Skin: warm and dry, no rash Neuro:  Strength and sensation are intact Psych: euthymic mood, full affect   EKG:  EKG is not ordered today.   Lipid Panel    Component Value Date/Time   CHOL 195 12/01/2014 1210   TRIG 83 12/01/2014 1210   HDL 56 12/01/2014 1210   CHOLHDL 3.5 12/01/2014 1210   VLDL 17 12/01/2014 1210   LDLCALC 122* 12/01/2014 1210      Wt Readings from Last 3 Encounters:  03/26/15 170 lb 3.2 oz (77.202 kg)  02/15/15 168 lb (76.204 kg)  01/29/15 164 lb 12.8 oz (74.753 kg)      ASSESSMENT AND PLAN:  1.  Edema  Volume does appear to be up some today  Would take 60 mg lasix tomorrow  Follow response  Will call her with lab results and to see how feels.  Prob due to eating out and increased salt intake  2.  Chronic diastolic CHF  Mild increase on exma  3.  HL  She is on Welchol  Not optimal control  Will need t oreview     Disposition:   FU with me later this summer    Signed, Dorris Carnes, MD  03/26/2015 2:44 PM    [address], [place], [place]  [address] Phone: [phone]; Fax: [phone]

## 2015-03-26 NOTE — Patient Instructions (Signed)
Your physician has recommended you make the following change in your medication:  1.) change lasix to 60 mg  Your physician recommends that you return for lab work in: today (BMET, BNP)  Dr. Harrington Challenger will be contacting you after she reviews your lab work.

## 2015-04-04 ENCOUNTER — Telehealth: Payer: Self-pay | Admitting: Internal Medicine

## 2015-04-04 DIAGNOSIS — I5032 Chronic diastolic (congestive) heart failure: Secondary | ICD-10-CM

## 2015-04-04 DIAGNOSIS — R0602 Shortness of breath: Secondary | ICD-10-CM

## 2015-04-04 NOTE — Telephone Encounter (Signed)
Spoke with pt brother, Virginia [name], reported pt BP's since last visit with Dr.Ross.  Pt has been taking Lasix as ordered the 60 mg every other day and he was worried that pt BP was to low.  Pt BP 145/85 Hr 68 before medication given and then about mid-afternoon pt BP 92/64.  On days when pt is not taking the Lasix BP has been 129/80; 103/82; 139/60; 95/62 worried that medication needs to be given every day to maintain in system.  Pt has been asymptomatic with each BP reading, no signs of dizziness or syncope. Pt edema in lower extremities has decreased since 4/25 visit with Dr. Harrington Challenger and she has been encouraged during the day to elevate feet when sitting.  Educated to weigh daily to assess weight changes and he agreed with plan.  Pt brother instructed to continue with mediations as ordered and our office will call if Dr. Harrington Challenger wants to make any adjustments.  Pt is leaving Friday to visit daughter, Virginia [name], in [place] and she will call our office if there are any questions. DPR signed to talk with daughter.

## 2015-04-04 NOTE — Telephone Encounter (Signed)
New Message   Patients brother is calling to speak to nurse in regards to medication. Please give them a call back. Thanks

## 2015-04-05 ENCOUNTER — Other Ambulatory Visit (INDEPENDENT_AMBULATORY_CARE_PROVIDER_SITE_OTHER): Payer: Medicare Other | Admitting: *Deleted

## 2015-04-05 DIAGNOSIS — I5032 Chronic diastolic (congestive) heart failure: Secondary | ICD-10-CM | POA: Diagnosis not present

## 2015-04-05 DIAGNOSIS — R0602 Shortness of breath: Secondary | ICD-10-CM

## 2015-04-05 LAB — BASIC METABOLIC PANEL
BUN: 21 mg/dL (ref 6–23)
CHLORIDE: 105 meq/L (ref 96–112)
CO2: 29 meq/L (ref 19–32)
Calcium: 9.2 mg/dL (ref 8.4–10.5)
Creatinine, Ser: 1.12 mg/dL (ref 0.40–1.20)
GFR: 50.12 mL/min — ABNORMAL LOW (ref >60.00)
GLUCOSE: 118 mg/dL — AB (ref 70–99)
POTASSIUM: 4.1 meq/L (ref 3.5–5.1)
Sodium: 140 mEq/L (ref 135–145)

## 2015-04-05 LAB — BRAIN NATRIURETIC PEPTIDE: Pro B Natriuretic peptide (BNP): 309 pg/mL — ABNORMAL HIGH (ref 0.0–100.0)

## 2015-04-05 NOTE — Addendum Note (Signed)
Addended by: Katrine Coho on: 04/05/2015 11:27 AM   Modules accepted: Orders

## 2015-04-05 NOTE — Telephone Encounter (Signed)
I would cut bystolic in 1/2 Continue to follow BP I would recomm patient come in for BMET and BNP, if possible before goes to [place]>

## 2015-04-05 NOTE — Telephone Encounter (Signed)
Pt's brother, [name] notified, verbalized understanding. Pt will come today for BMET/BNP.

## 2015-04-06 ENCOUNTER — Other Ambulatory Visit: Payer: Self-pay

## 2015-04-06 DIAGNOSIS — R0602 Shortness of breath: Secondary | ICD-10-CM

## 2015-04-06 DIAGNOSIS — I5032 Chronic diastolic (congestive) heart failure: Secondary | ICD-10-CM

## 2015-04-06 MED ORDER — NEBIVOLOL HCL 5 MG PO TABS: 5.0000 mg | ORAL_TABLET | Freq: Every day | ORAL | Status: DC

## 2015-04-11 ENCOUNTER — Telehealth: Payer: Self-pay | Admitting: Internal Medicine

## 2015-04-11 DIAGNOSIS — R609 Edema, unspecified: Secondary | ICD-10-CM

## 2015-04-11 DIAGNOSIS — I5032 Chronic diastolic (congestive) heart failure: Secondary | ICD-10-CM

## 2015-04-11 NOTE — Telephone Encounter (Signed)
New message     Returning Virginia [name]'s call from yesterday

## 2015-04-11 NOTE — Telephone Encounter (Signed)
New message       After pt takes her medication she feels nauseous and tired   Am I having a reaction to the medication furosemide or lasix?   Pt has had a little weight gain

## 2015-04-11 NOTE — Telephone Encounter (Signed)
Daughter calling stating her Mom has been with her in [place] and they are flying back tonight.  States she has been nauseous, dizzy, SOB, and has gained several pounds over the past week.  States she is nauseous before and after taking Lasix. She doesn't know the amount of wt gain because she didn't have a scale at her home.  States she just doesn't feel good.. The SOB is even with her sitting. States her BP has been elevated although she doesn't have the readings in front of her since they are flying back. Scheduled her to see Raylene Everts tomorrow at 2:00.  Advised daughter of appointment time and to bring all of her medications with her. She verbalizes understanding and will have her here tomorrow.

## 2015-04-11 NOTE — Telephone Encounter (Signed)
Spoke with patient's brother, [name],  who received a message yesterday for his sister, the patient.  I advised him of Dr. Alan Ripper advice for patient to take Lasix 1 1/2 tabs every other day and to repeat BNP and BMET in 2 weeks.  [name] states patient has been taking Lasix at that dosage.  I explained need for f/u of fluid level and kidney function and we scheduled lab appointment for 5/25 per David's request.  I advised him or patient to call back with questions or concerns.  [name] verbalized understanding and agreement. Orders in epic.

## 2015-04-12 ENCOUNTER — Encounter: Payer: Self-pay | Admitting: Physician Assistant

## 2015-04-12 ENCOUNTER — Ambulatory Visit (INDEPENDENT_AMBULATORY_CARE_PROVIDER_SITE_OTHER): Payer: Medicare Other | Admitting: Physician Assistant

## 2015-04-12 VITALS — BP 132/82 | HR 83 | Ht 62.5 in | Wt 170.8 lb

## 2015-04-12 DIAGNOSIS — R739 Hyperglycemia, unspecified: Secondary | ICD-10-CM | POA: Diagnosis not present

## 2015-04-12 DIAGNOSIS — R0602 Shortness of breath: Secondary | ICD-10-CM

## 2015-04-12 DIAGNOSIS — I6529 Occlusion and stenosis of unspecified carotid artery: Secondary | ICD-10-CM | POA: Diagnosis not present

## 2015-04-12 LAB — CBC WITH DIFFERENTIAL/PLATELET
Basophils Absolute: 0 10*3/uL (ref 0.0–0.1)
Basophils Relative: 0.3 % (ref 0.0–3.0)
EOS PCT: 1.8 % (ref 0.0–5.0)
Eosinophils Absolute: 0.2 10*3/uL (ref 0.0–0.7)
HEMATOCRIT: 43.2 % (ref 36.0–46.0)
HEMOGLOBIN: 14.3 g/dL (ref 12.0–15.0)
LYMPHS ABS: 2.7 10*3/uL (ref 0.7–4.0)
LYMPHS PCT: 26.6 % (ref 12.0–46.0)
MCHC: 33.1 g/dL (ref 30.0–36.0)
MCV: 86.2 fl (ref 78.0–100.0)
Monocytes Absolute: 1.1 10*3/uL — ABNORMAL HIGH (ref 0.1–1.0)
Monocytes Relative: 10.8 % (ref 3.0–12.0)
NEUTROS ABS: 6.2 10*3/uL (ref 1.4–7.7)
Neutrophils Relative %: 60.5 % (ref 43.0–77.0)
Platelets: 276 10*3/uL (ref 150.0–400.0)
RBC: 5.01 Mil/uL (ref 3.87–5.11)
RDW: 13.5 % (ref 11.5–15.5)
WBC: 10.3 10*3/uL (ref 4.0–10.5)

## 2015-04-12 LAB — BASIC METABOLIC PANEL
BUN: 20 mg/dL (ref 6–23)
CHLORIDE: 100 meq/L (ref 96–112)
CO2: 32 mEq/L (ref 19–32)
CREATININE: 1.01 mg/dL (ref 0.40–1.20)
Calcium: 9.3 mg/dL (ref 8.4–10.5)
GFR: 56.47 mL/min — ABNORMAL LOW (ref >60.00)
Glucose, Bld: 88 mg/dL (ref 70–99)
POTASSIUM: 4.4 meq/L (ref 3.5–5.1)
Sodium: 137 mEq/L (ref 135–145)

## 2015-04-12 LAB — BRAIN NATRIURETIC PEPTIDE: PRO B NATRI PEPTIDE: 115 pg/mL — AB (ref 0.0–100.0)

## 2015-04-12 LAB — HEMOGLOBIN A1C: HEMOGLOBIN A1C: 5.6 % (ref 4.6–6.5)

## 2015-04-12 NOTE — Progress Notes (Signed)
Cardiology Office Note   Date:  04/12/2015   ID:  Virginia [name], DOB 28-Apr-1936, MRN [id number]  PCP:  Lauree Chandler, NP  Cardiologist:  Dr. Harrington Challenger    SOB    History of Present Illness: Virginia [name] is a 79 y.o. female with a history of HLD, HTN, carotid artery disease s/p L CEA, dementia, and chronic diastolic CHF who was added on to my schedule today for evaluation of SOB.   She was seen by Dr. Harrington Challenger on 03/26/15. She was felt to be slightly volume overloaded and her lasix was increased from 40mg  every other day to 60mg  every other day. Her weight was 170lbs at that visit. Per a phone note on 04/04/15, her BP was then dropping a little low on increased lasix and her bystolic was cut in half (10mg --> 5mg ). Had been doing okay, then went to [place]. In [place] she followed a salt restricted diet with her daughter and took all her medicines as prescribed. She has been nauseous, diaphoretic, dizzy, SOB, and has gained several pounds over the past week. States she is nauseous before and after taking Lasix. She doesn't know the amount of wt gain because she didn't have a scale at her home. States she just doesn't feel good.. The SOB is even with her sitting. She also thinks that her BP has been elevated but doesn't have readings. She presents today with her daughter and son. They are clearly some tension between the family. The patients dementia seems to be a little more pronounced today as she doesn't seem to remember her symptoms the way her daughter does.      Past Medical History  Diagnosis Date  . Hyperlipidemia   . Hypertension   . Carotid artery occlusion   . Fall at home Sept. 2013  . Memory disorder 10/24/2014    Past Surgical History  Procedure Laterality Date  . Carotid endarterectomy      left CEA  . Tonsillectomy    . Appendectomy    . Breast reduction surgery    . Skin cancer removal    . Cataract extraction Bilateral   . Colonoscopy  2015  . Eye surgery     cataracts removed, bilaterally      Current Outpatient Prescriptions  Medication Sig Dispense Refill  . ARTIFICIAL TEAR OP Place 2 drops into both eyes daily.     Marland Kitchen aspirin 81 MG tablet Take 81 mg by mouth daily.    Marland Kitchen b complex vitamins tablet Take 1 tablet by mouth daily.    . Biotin 5000 MCG TABS Take 1 tablet by mouth daily.    . Cholecalciferol (VITAMIN D) 2000 UNITS CAPS Take 1 capsule by mouth daily.    Marland Kitchen co-enzyme Q-10 50 MG capsule Take 100 mg by mouth daily.     . colesevelam (WELCHOL) 625 MG tablet Take 3 tablets (1,875 mg total) by mouth 2 (two) times daily with a meal. With Lunch and Dinner 180 tablet 3  . furosemide (LASIX) 40 MG tablet Take 1.5 tablets (60 mg total) by mouth every other day. 30 tablet   . ipratropium (ATROVENT) 0.03 % nasal spray Place 2 sprays into both nostrils every 12 (twelve) hours. 30 mL 12  . losartan (COZAAR) 50 MG tablet Take one tablet by mouth once daily for blood pressure 30 tablet 3  . Memantine HCl-Donepezil HCl (NAMZARIC) 28-10 MG CP24 Take 1 tablet by mouth daily. 30 capsule 4  . Multiple Vitamins-Minerals (  ALIVE WOMENS 50+ PO) Take by mouth daily.    . Multiple Vitamins-Minerals (PRESERVISION AREDS 2) CAPS Take 1 capsule by mouth daily.    . nebivolol (BYSTOLIC) 5 MG tablet Take 5 mg by mouth daily.    Marland Kitchen saccharomyces boulardii (FLORASTOR) 250 MG capsule Take 250 mg by mouth 2 (two) times a week.    . Vitamin D, Ergocalciferol, (DRISDOL) 50000 UNITS CAPS Take 50,000 Units by mouth every 7 (seven) days. On Fridays     No current facility-administered medications for this visit.    Allergies:   Lidocaine    Social History:  The patient  reports that she quit smoking about 20 years ago. Her smoking use included Cigarettes. She quit after 14 years of use. She has never used smokeless tobacco. She reports that she does not drink alcohol or use illicit drugs.   Family History:  The patient'sfamily history includes Cancer (age of onset: 12) in  her mother; Cancer (age of onset: 54) in her maternal aunt; Dementia in her brother; Diabetes in her maternal aunt and maternal grandmother; Heart attack in her father; Heart disease in her father; Heart failure in her maternal grandmother; Hypertension in her father. There is no history of Stroke.    ROS:  Please see the history of present illness.   Otherwise, review of systems are positive for none.   All other systems are reviewed and negative.    PHYSICAL EXAM: VS:  BP 132/82 mmHg  Pulse 83  Ht 5' 2.5" (1.588 m)  Wt 170 lb 12.8 oz (77.474 kg)  BMI 30.72 kg/m2  SpO2 95% , BMI Body mass index is 30.72 kg/(m^2). GEN: Well nourished, well developed, in no acute distress HEENT: normal Neck: mildly elevated JVD, carotid bruits, or masses Cardiac:RRR; no murmurs, rubs, or gallops,1 + bilateral LE edema  Respiratory:  clear to auscultation bilaterally, normal work of breathing. Decreased lung sounds at bases. GI: soft, nontender, nondistended, + BS MS: no deformity or atrophy Skin: warm and dry, no rash Neuro:  Strength and sensation are intact Psych: euthymic mood, full affect   EKG:  EKG is ordered today. The ekg ordered today demonstrates HR 83. NSR .    Recent Labs: 12/05/2014: B Natriuretic Peptide 475.7* 12/09/2014: Magnesium 2.0 12/29/2014: ALT 17; Hemoglobin 13.5; Platelets 259.0; TSH 1.00 04/05/2015: BUN 21; Creatinine 1.12; Potassium 4.1; Pro B Natriuretic peptide (BNP) 309.0*; Sodium 140    Lipid Panel    Component Value Date/Time   CHOL 195 12/01/2014 1210   TRIG 83 12/01/2014 1210   HDL 56 12/01/2014 1210   CHOLHDL 3.5 12/01/2014 1210   VLDL 17 12/01/2014 1210   LDLCALC 122* 12/01/2014 1210      Wt Readings from Last 3 Encounters:  04/12/15 170 lb 12.8 oz (77.474 kg)  03/26/15 170 lb 3.2 oz (77.202 kg)  02/15/15 168 lb (76.204 kg)       ASSESSMENT AND PLAN:  Virginia [name] is a 79 y.o. female with a history of HLD, HTN, carotid artery disease s/p L CEA,  dementia, and chronic diastolic CHF who was added on to my schedule today for evaluation of SOB.   Chronic diastolic CHF- She does appear mildly volume overloaded today, but not dramatic. Her  Weight is 170lbs which is the same as when she was seen by Dr. Harrington Challenger in the office a few weeks ago and she was felt to be volume overloaded at that time.  -- Will increase her lasix from 60 every  other day to 60 mg qd. Will get BMET and BNP today and repeat this on Tuesday next week after increase dose of lasix. Will also order CBC and UA due to sweats and generally not feeling well to rule out infection.  -- Reinforced dietary compliance with salt and fluid. Encouraged exercise.   HTN - blood pressure well controlled today: 132/82.Marland Kitchen Continue bystolic 5mg  and losartan 50mg .   HLD- continue statin   Hyperglycemia- patient's daughter is requesting a HgA1c. Will order.    Current medicines are reviewed at length with the patient today.  The patient has concerns regarding medicines.  The following changes have been made:   will increase her lasix from 60 every other day to 60 mg qd.   Labs/ tests ordered today include:   Orders Placed This Encounter  Procedures  . Basic Metabolic Panel (BMET)  . CBC with Differential  . B Nat Peptide  . HgB A1c  . Urinalysis  . Basic Metabolic Panel (BMET)  . B Nat Peptide  . EKG 12-Lead     Disposition:   FU next week for lab work and in 1 month with Dr. Harrington Challenger.  Renea Ee  04/12/2015 3:32 PM    [facility] [place], [place], [place]  [address] Phone: [phone]; Fax: [phone]

## 2015-04-12 NOTE — Patient Instructions (Signed)
Medication Instructions:   Your physician has recommended you make the following change in your medication:   1-Lasix 60 mg by mouth daily for 4 days.  Labwork:  Your physician recommends that you have labs today Hgb A1c, BMET, BNP, CBC, and UA  Your physician recommends that you return for lab work on Tuesday for a BMET and BNP  Testing/Procedures:  none  Follow-Up:  Your physician recommends that you schedule a follow-up appointment in: 1 month with Dr. Harrington Challenger.  Any Other Special Instructions Will Be Listed Below (If Applicable).

## 2015-04-13 ENCOUNTER — Telehealth: Payer: Self-pay | Admitting: Internal Medicine

## 2015-04-13 NOTE — Telephone Encounter (Signed)
pts dtr who is on DPR-would like lab results--also pt's finger and elbow were cramping- pls call [phone]

## 2015-04-13 NOTE — Telephone Encounter (Signed)
Called patient's daughter (DPR)  back about her lab results. Per Angelena Form PA, all of her labs look good. HgA1c is normal. BNP actually improved from previous. No change in plans. Patient is not taking a potassium pill and the next few days patient will be taking Lasix 60 mg daily. Encourage patient's daughter to have her mom eat potassium rich foods while she is taking Lasix daily. Patient's cramping in finger and elbow stopped after lunch. Patient's daughter verbalized understanding and will call our office with any other questions or concerns.

## 2015-04-16 DIAGNOSIS — H04123 Dry eye syndrome of bilateral lacrimal glands: Secondary | ICD-10-CM | POA: Diagnosis not present

## 2015-04-16 DIAGNOSIS — H3531 Nonexudative age-related macular degeneration: Secondary | ICD-10-CM | POA: Diagnosis not present

## 2015-04-16 DIAGNOSIS — H16103 Unspecified superficial keratitis, bilateral: Secondary | ICD-10-CM | POA: Diagnosis not present

## 2015-04-16 DIAGNOSIS — H26492 Other secondary cataract, left eye: Secondary | ICD-10-CM | POA: Diagnosis not present

## 2015-04-17 ENCOUNTER — Other Ambulatory Visit (INDEPENDENT_AMBULATORY_CARE_PROVIDER_SITE_OTHER): Payer: Medicare Other

## 2015-04-17 ENCOUNTER — Telehealth: Payer: Self-pay | Admitting: Internal Medicine

## 2015-04-17 DIAGNOSIS — R0602 Shortness of breath: Secondary | ICD-10-CM | POA: Diagnosis not present

## 2015-04-17 LAB — BASIC METABOLIC PANEL
BUN: 25 mg/dL — AB (ref 6–23)
CALCIUM: 9 mg/dL (ref 8.4–10.5)
CO2: 32 meq/L (ref 19–32)
Chloride: 102 mEq/L (ref 96–112)
Creatinine, Ser: 1.06 mg/dL (ref 0.40–1.20)
GFR: 53.41 mL/min — AB (ref >60.00)
GLUCOSE: 83 mg/dL (ref 70–99)
POTASSIUM: 4.1 meq/L (ref 3.5–5.1)
Sodium: 138 mEq/L (ref 135–145)

## 2015-04-17 LAB — BRAIN NATRIURETIC PEPTIDE: Pro B Natriuretic peptide (BNP): 78 pg/mL (ref 0.0–100.0)

## 2015-04-17 NOTE — Telephone Encounter (Signed)
Walk in pt form-please call about meds-Michalene back Thursday will gove to her then

## 2015-04-18 ENCOUNTER — Ambulatory Visit
Admission: RE | Admit: 2015-04-18 | Discharge: 2015-04-18 | Disposition: A | Discharge status: Home / Self Care | Payer: Medicare Other | Source: Ambulatory Visit | Attending: Nurse Practitioner | Admitting: Nurse Practitioner

## 2015-04-18 ENCOUNTER — Telehealth: Payer: Self-pay | Admitting: Neurology

## 2015-04-18 DIAGNOSIS — E2839 Other primary ovarian failure: Secondary | ICD-10-CM

## 2015-04-18 DIAGNOSIS — M85852 Other specified disorders of bone density and structure, left thigh: Secondary | ICD-10-CM | POA: Diagnosis not present

## 2015-04-18 DIAGNOSIS — M8588 Other specified disorders of bone density and structure, other site: Secondary | ICD-10-CM | POA: Diagnosis not present

## 2015-04-18 NOTE — Telephone Encounter (Signed)
I was unable to reach the patient's brother. I left him a voicemail asking him to call me back. I was also unable to reach [name] and there was no voicemail set up.

## 2015-04-18 NOTE — Telephone Encounter (Signed)
Patients daughter [name]) called and requested to speak with the nurse about changing the appt time 5/25 to another day next week. She requested that the nurse call [name] (uncle)([phone]) instead of returning the call to her. (He will be better able to discuss the pt's schedule). I left the pt's current appt on the schedule because [name] stated they would keep that appt if necessary. Please call and advise.

## 2015-04-19 NOTE — Telephone Encounter (Signed)
The patient's brother called back. Appointment r/s to 6/10 at 12 PM.

## 2015-04-25 ENCOUNTER — Other Ambulatory Visit: Payer: Medicare Other

## 2015-04-25 ENCOUNTER — Telehealth: Payer: Self-pay | Admitting: *Deleted

## 2015-04-25 ENCOUNTER — Ambulatory Visit: Payer: No Typology Code available for payment source | Admitting: Neurology

## 2015-04-25 NOTE — Telephone Encounter (Signed)
Patient walk-in. Thought she had lab appointment, however that was cancelled after she rec'd lab work the previous week and results came back stable. Patient stated she needed the results, as no one had called her yet regarding them. Reviewed lab results from previous few times, including lab work from 5/17. Provided education regarding BNP and GFR. Patient also verbalizing recent issues with sinuses. Dr. Harrington Challenger had prescribed her Atrovent nasal spray. Encouraged patient to stay hydrated (she denies being on any fluid restriction) and using humidifier for ongoing sinus issues/nasal dryness. Patient accompanied by her family member today and both verbalized understanding and appreciation of information.

## 2015-05-04 ENCOUNTER — Encounter: Payer: Self-pay | Admitting: *Deleted

## 2015-05-09 ENCOUNTER — Other Ambulatory Visit: Payer: Self-pay | Admitting: *Deleted

## 2015-05-10 DIAGNOSIS — H26492 Other secondary cataract, left eye: Secondary | ICD-10-CM | POA: Diagnosis not present

## 2015-05-10 DIAGNOSIS — H2640 Unspecified secondary cataract: Secondary | ICD-10-CM | POA: Diagnosis not present

## 2015-05-11 ENCOUNTER — Ambulatory Visit (INDEPENDENT_AMBULATORY_CARE_PROVIDER_SITE_OTHER): Payer: Medicare Other | Admitting: Neurology

## 2015-05-11 ENCOUNTER — Encounter: Payer: Self-pay | Admitting: Neurology

## 2015-05-11 VITALS — BP 122/69 | HR 76 | Ht 65.0 in | Wt 170.0 lb

## 2015-05-11 DIAGNOSIS — I6529 Occlusion and stenosis of unspecified carotid artery: Secondary | ICD-10-CM | POA: Diagnosis not present

## 2015-05-11 DIAGNOSIS — R413 Other amnesia: Secondary | ICD-10-CM

## 2015-05-11 MED ORDER — DONEPEZIL HCL 10 MG PO TABS: 10.0000 mg | ORAL_TABLET | Freq: Every day | ORAL | Status: DC

## 2015-05-11 MED ORDER — MEMANTINE HCL 10 MG PO TABS: 10.0000 mg | ORAL_TABLET | Freq: Two times a day (BID) | ORAL | Status: DC

## 2015-05-11 MED ORDER — PHOSPHATIDYLSERINE-DHA-EPA 100-19.5-6.5 MG PO CAPS: 1.0000 | ORAL_CAPSULE | Freq: Every day | ORAL | Status: DC

## 2015-05-11 NOTE — Progress Notes (Signed)
Reason for visit: Memory disturbance  Virginia [name] is an 79 y.o. female  History of present illness:  Ms. Sramek is a 79 year old right-handed white female with a history of a progressive memory disorder. The patient has been placed on Aricept, and eventually she was switched to [employer]. She has done well with this medication. She is tolerating it fairly well, without any stomach upset. The patient had some insomnia on Aricept initially. This is no longer the case. Currently, her brother is living with her, and her sister comes down once a month from [place], [place] to check on her. The sister does the finances, the brother keeps up with the medications and appointments. The patient does not operate a motor vehicle. They returned to this office for an evaluation.  Past Medical History  Diagnosis Date  . Hyperlipidemia   . Hypertension   . Carotid artery occlusion   . Fall at home Sept. 2013  . Memory disorder 10/24/2014    Past Surgical History  Procedure Laterality Date  . Carotid endarterectomy      left CEA  . Tonsillectomy    . Appendectomy    . Breast reduction surgery    . Skin cancer removal    . Cataract extraction Bilateral   . Colonoscopy  2015  . Eye surgery      cataracts removed, bilaterally     Family History  Problem Relation Age of Onset  . Heart disease Father     Before age 67  . Hypertension Father   . Heart attack Father   . Cancer Mother 27    Brain  . Dementia Brother   . Heart failure Maternal Grandmother   . Diabetes Maternal Grandmother   . Stroke Neg Hx   . Cancer Maternal Aunt 61    breast cancer  . Diabetes Maternal Aunt     Social history:  reports that she quit smoking about 20 years ago. Her smoking use included Cigarettes. She quit after 14 years of use. She has never used smokeless tobacco. She reports that she does not drink alcohol or use illicit drugs.    Allergies  Allergen Reactions  . Lidocaine Anaphylaxis,  Swelling, Rash and Other (See Comments)    Any of the " Caine  Family "    Medications:  Prior to Admission medications   Medication Sig Start Date End Date Taking? Authorizing Provider  ARTIFICIAL TEAR OP Place 2 drops into both eyes daily.    Yes Historical Provider, MD  aspirin 81 MG tablet Take 81 mg by mouth daily.   Yes Historical Provider, MD  b complex vitamins tablet Take 1 tablet by mouth daily.   Yes Historical Provider, MD  Biotin 5000 MCG TABS Take 1 tablet by mouth daily.   Yes Historical Provider, MD  Cholecalciferol (VITAMIN D) 2000 UNITS CAPS Take 1 capsule by mouth daily.   Yes Historical Provider, MD  co-enzyme Q-10 50 MG capsule Take 100 mg by mouth daily.    Yes Historical Provider, MD  colesevelam (WELCHOL) 625 MG tablet Take 3 tablets (1,875 mg total) by mouth 2 (two) times daily with a meal. With Lunch and Dinner 03/07/15  Yes Lauree Chandler, NP  furosemide (LASIX) 40 MG tablet Take 1.5 tablets (60 mg total) by mouth every other day. 03/26/15  Yes Fay Records, MD  ipratropium (ATROVENT) 0.03 % nasal spray Place 2 sprays into both nostrils every 12 (twelve) hours. 01/29/15  Yes Dorris Carnes  V, MD  losartan (COZAAR) 50 MG tablet Take one tablet by mouth once daily for blood pressure 02/01/15  Yes Tiffany L Reed, DO  Multiple Vitamins-Minerals (ALIVE WOMENS 50+ PO) Take by mouth daily.   Yes Historical Provider, MD  Multiple Vitamins-Minerals (PRESERVISION AREDS 2) CAPS Take 1 capsule by mouth daily.   Yes Historical Provider, MD  nebivolol (BYSTOLIC) 5 MG tablet Take 5 mg by mouth daily.   Yes Historical Provider, MD  saccharomyces boulardii (FLORASTOR) 250 MG capsule Take 250 mg by mouth 2 (two) times a week.   Yes Historical Provider, MD  Vitamin D, Ergocalciferol, (DRISDOL) 50000 UNITS CAPS Take 50,000 Units by mouth every 7 (seven) days. On Fridays   Yes Historical Provider, MD  donepezil (ARICEPT) 10 MG tablet Take 1 tablet (10 mg total) by mouth at bedtime. 05/11/15    Kathrynn Ducking, MD  memantine (NAMENDA) 10 MG tablet Take 1 tablet (10 mg total) by mouth 2 (two) times daily. 05/11/15   Kathrynn Ducking, MD  Phosphatidylserine-DHA-EPA (VAYACOG) 100-19.5-6.5 MG CAPS Take 1 tablet by mouth daily. 05/11/15   Kathrynn Ducking, MD    ROS:  Out of a complete 14 system review of symptoms, the patient complains only of the following symptoms, and all other reviewed systems are negative.  Runny nose Shortness of breath Environmental allergies Memory loss, dizziness  Blood pressure 122/69, pulse 76, height 5\' 5"  (1.651 m), weight 170 lb (77.111 kg).  Physical Exam  General: The patient is alert and cooperative at the time of the examination.  Skin: No significant peripheral edema is noted.   Neurologic Exam  Mental status: The patient is alert and oriented x 2 at the time of the examination (the patient is not oriented to date). The patient has apparent normal recent and remote memory, with an apparently normal attention span and concentration ability. Mini-Mental Status Examination done today shows a total score 25/30. The patient is able to name 9 animals in 30 seconds.   Cranial nerves: Facial symmetry is present. Speech is normal, no aphasia or dysarthria is noted. Extraocular movements are full. Visual fields are full. Pupils are anisocoric, the left pupil is 5-6 mm, right pupil is 3 mm.  Motor: The patient has good strength in all 4 extremities.  Sensory examination: Soft touch sensation is symmetric on the face, arms, and legs.  Coordination: The patient has good finger-nose-finger and heel-to-shin bilaterally.  Gait and station: The patient has a normal gait. Tandem gait is unsteady. Romberg is negative. No drift is seen.  Reflexes: Deep tendon reflexes are symmetric.   Assessment/Plan:  1. Memory disturbance   The patient will be switched to generic donepezil and Namenda. The patient will be placed on Vayacog taking 1 tablet daily. She  will follow-up in about 6 months. If the patient has any new issues, they are to contact our office.  Jill Alexanders MD 05/11/2015 6:26 PM  [facility] [address] [place], [place] [address]  Phone [phone] Fax [phone]

## 2015-05-15 ENCOUNTER — Other Ambulatory Visit: Payer: Medicare Other

## 2015-05-15 DIAGNOSIS — E559 Vitamin D deficiency, unspecified: Secondary | ICD-10-CM | POA: Diagnosis not present

## 2015-05-15 DIAGNOSIS — I5032 Chronic diastolic (congestive) heart failure: Secondary | ICD-10-CM | POA: Diagnosis not present

## 2015-05-16 LAB — BASIC METABOLIC PANEL
BUN/Creatinine Ratio: 17 (ref 11–26)
BUN: 19 mg/dL (ref 8–27)
CALCIUM: 9.2 mg/dL (ref 8.7–10.3)
CO2: 29 mmol/L (ref 18–29)
CREATININE: 1.14 mg/dL — AB (ref 0.57–1.00)
Chloride: 99 mmol/L (ref 97–108)
GFR calc Af Amer: 54 mL/min/{1.73_m2} — ABNORMAL LOW (ref >59)
GFR calc non Af Amer: 46 mL/min/{1.73_m2} — ABNORMAL LOW (ref >59)
GLUCOSE: 88 mg/dL (ref 65–99)
Potassium: 4.4 mmol/L (ref 3.5–5.2)
Sodium: 142 mmol/L (ref 134–144)

## 2015-05-16 LAB — VITAMIN D 25 HYDROXY (VIT D DEFICIENCY, FRACTURES): Vit D, 25-Hydroxy: 44.8 ng/mL (ref 30.0–100.0)

## 2015-05-17 ENCOUNTER — Encounter: Payer: Self-pay | Admitting: Internal Medicine

## 2015-05-17 ENCOUNTER — Ambulatory Visit (INDEPENDENT_AMBULATORY_CARE_PROVIDER_SITE_OTHER): Payer: Medicare Other | Admitting: Internal Medicine

## 2015-05-17 VITALS — BP 106/62 | HR 67 | Temp 98.2°F | Resp 20 | Ht 65.0 in | Wt 171.2 lb

## 2015-05-17 DIAGNOSIS — J302 Other seasonal allergic rhinitis: Secondary | ICD-10-CM | POA: Diagnosis not present

## 2015-05-17 DIAGNOSIS — I6529 Occlusion and stenosis of unspecified carotid artery: Secondary | ICD-10-CM

## 2015-05-17 DIAGNOSIS — E785 Hyperlipidemia, unspecified: Secondary | ICD-10-CM

## 2015-05-17 DIAGNOSIS — E559 Vitamin D deficiency, unspecified: Secondary | ICD-10-CM | POA: Diagnosis not present

## 2015-05-17 DIAGNOSIS — I10 Essential (primary) hypertension: Secondary | ICD-10-CM | POA: Diagnosis not present

## 2015-05-17 DIAGNOSIS — I5032 Chronic diastolic (congestive) heart failure: Secondary | ICD-10-CM | POA: Diagnosis not present

## 2015-05-17 DIAGNOSIS — R413 Other amnesia: Secondary | ICD-10-CM | POA: Diagnosis not present

## 2015-05-17 DIAGNOSIS — M8580 Other specified disorders of bone density and structure, unspecified site: Secondary | ICD-10-CM | POA: Diagnosis not present

## 2015-05-17 NOTE — Patient Instructions (Addendum)
Walk 10 mins daily and work up to 30 mins daily Take ca with D 1200/800 (caltrate with D) along with vitamin D 2000 units daily for osteopenia Drink more water (6-8 8oz glasses per day) Try flonase nasal spray over the counter for your congestion along with the cetirizine.

## 2015-05-17 NOTE — Progress Notes (Signed)
Patient ID: Virginia [name], female   DOB: 06/26/1936, 79 y.o.   MRN: [id number]   Location:  [facility]  Code Status: full code Goals of Care: Advanced Directive information Does patient have an advance directive?: Yes, Type of Advance Directive: [place];Living will, Does patient want to make changes to advanced directive?: No - Patient declined  Chief Complaint  Patient presents with  . Medical Management of Chronic Issues    3 month follow-up, wants magnesium level drawn if ca and vit D abnormal, would like to talk  about  the nasel spray and allergy pills not working and what to do    HPI: Patient is a 79 y.o. white female seen in the office today for med mgt of chronic diseases.  She is here with her brother.    Namzaric was too expensive so back to generic aricept and namenda.    End of august or sept, had a nasal procedure.  Has had a lot of congestion and postnasal drip Nasal spray was prescribed and allergy medicine.  Helped some but not fully resolved.    Reviewed bone density:  Discussed calcium with D 1200/800 total daily and weightbearing exercise. Does chair yoga three times a week.  Takes vitamin D3 2000 units daily--cont.    Sometimes short-term minor things are forgotten.  Does pretty well.    Discussed slight bump in renal function:  Lasix 60mg  every other day.  Also on cozaar.  Encouraged her to drink more water.    Review of Systems:  Review of Systems  Constitutional: Negative for fever and malaise/fatigue.  HENT: Positive for congestion. Negative for hearing loss and sore throat.   Eyes: Negative for blurred vision.       Glasses  Respiratory: Negative for cough and shortness of breath.   Cardiovascular: Positive for leg swelling. Negative for chest pain and PND.       Improves with elevating feet  Gastrointestinal: Negative for abdominal pain, constipation, blood in stool and melena.    Genitourinary: Negative for dysuria, urgency and frequency.  Musculoskeletal: Negative for myalgias, joint pain and falls.  Skin: Negative for itching and rash.  Neurological: Negative for dizziness, loss of consciousness and weakness.  Psychiatric/Behavioral: Positive for memory loss. Negative for depression. The patient is not nervous/anxious and does not have insomnia.     Past Medical History  Diagnosis Date  . Hyperlipidemia   . Hypertension   . Carotid artery occlusion   . Fall at home Sept. 2013  . Memory disorder 10/24/2014    Past Surgical History  Procedure Laterality Date  . Carotid endarterectomy      left CEA  . Tonsillectomy    . Appendectomy    . Breast reduction surgery    . Skin cancer removal    . Cataract extraction Bilateral   . Colonoscopy  2015  . Eye surgery      cataracts removed, bilaterally     Allergies  Allergen Reactions  . Lidocaine Anaphylaxis, Swelling, Rash and Other (See Comments)    Any of the " [place] "   Medications: Patient's Medications  New Prescriptions   No medications on file  Previous Medications   ARTIFICIAL TEAR OP    Place 2 drops into both eyes daily.    ASPIRIN 81 MG TABLET    Take 81 mg by mouth daily.   B COMPLEX VITAMINS TABLET    Take 1  tablet by mouth daily.   BIOTIN 5000 MCG TABS    Take 1 tablet by mouth daily.   CHOLECALCIFEROL (VITAMIN D) 2000 UNITS CAPS    Take 1 capsule by mouth daily.   CO-ENZYME Q-10 50 MG CAPSULE    Take 100 mg by mouth daily.    COLESEVELAM (WELCHOL) 625 MG TABLET    Take 3 tablets (1,875 mg total) by mouth 2 (two) times daily with a meal. With Lunch and Dinner   DONEPEZIL (ARICEPT) 10 MG TABLET    Take 1 tablet (10 mg total) by mouth at bedtime.   FUROSEMIDE (LASIX) 40 MG TABLET    Take 1.5 tablets (60 mg total) by mouth every other day.   IPRATROPIUM (ATROVENT) 0.03 % NASAL SPRAY    Place 2 sprays into both nostrils every 12 (twelve) hours.   LOSARTAN (COZAAR) 50 MG TABLET     Take one tablet by mouth once daily for blood pressure   MEMANTINE (NAMENDA) 10 MG TABLET    Take 1 tablet (10 mg total) by mouth 2 (two) times daily.   MULTIPLE VITAMINS-MINERALS (ALIVE WOMENS 50+ PO)    Take by mouth daily.   MULTIPLE VITAMINS-MINERALS (PRESERVISION AREDS 2) CAPS    Take 1 capsule by mouth daily.   NEBIVOLOL (BYSTOLIC) 5 MG TABLET    Take 5 mg by mouth daily.   PHOSPHATIDYLSERINE-DHA-EPA (VAYACOG) 100-19.5-6.5 MG CAPS    Take 1 tablet by mouth daily.   SACCHAROMYCES BOULARDII (FLORASTOR) 250 MG CAPSULE    Take 250 mg by mouth 2 (two) times a week.   VITAMIN D, ERGOCALCIFEROL, (DRISDOL) 50000 UNITS CAPS    Take 50,000 Units by mouth every 7 (seven) days. On Fridays  Modified Medications   No medications on file  Discontinued Medications   No medications on file    Physical Exam: Filed Vitals:   05/17/15 1434  BP: 106/62  Pulse: 67  Temp: 98.2 F (36.8 C)  TempSrc: Oral  Resp: 20  Height: 5\' 5"  (1.651 m)  Weight: 171 lb 3.2 oz (77.656 kg)  SpO2: 93%   Physical Exam  Constitutional: She is oriented to person, place, and time. She appears well-developed and well-nourished. No distress.  HENT:  Head: Normocephalic and atraumatic.  Eyes:  glasses  Cardiovascular: Normal rate, regular rhythm, normal heart sounds and intact distal pulses.   Pulmonary/Chest: Effort normal and breath sounds normal.  Abdominal: Soft. Bowel sounds are normal. She exhibits no distension. There is no tenderness.  Musculoskeletal: Normal range of motion.  Neurological: She is alert and oriented to person, place, and time.  Skin: Skin is warm and dry. There is pallor.  Psychiatric: She has a normal mood and affect. Her behavior is normal.    Labs reviewed: Basic Metabolic Panel:  Recent Labs  12/07/14 1045  12/09/14 0435  12/29/14 1242  04/12/15 1533 04/17/15 1127 05/15/15 1453  NA  --   < > 137  < > 140  < > 137 138 142  K  --   < > 3.7  < > 3.9  < > 4.4 4.1 4.4  CL  --   <  > 85*  < > 104  < > 100 102 99  CO2  --   < > 45*  < > 25  < > 32 32 29  GLUCOSE  --   < > 104*  < > 96  < > 88 83 88  BUN  --   < >  23  < > 20  < > 20 25* 19  CREATININE  --   < > 0.85  < > 1.08  < > 1.01 1.06 1.14*  CALCIUM  --   < > 8.3*  < > 9.0  < > 9.3 9.0 9.2  MG  --   --  2.0  --   --   --   --   --   --   TSH 0.946  --   --   --  1.00  --   --   --   --   < > = values in this interval not displayed. Liver Function Tests:  Recent Labs  12/03/14 0500 12/07/14 0550 12/29/14 1242  AST 30 19 22   ALT 53* 26 17  ALKPHOS 73 44 68  BILITOT 0.7 0.8 0.9  PROT 6.0 5.4* 6.4  ALBUMIN 2.9* 2.5* 3.4*   No results for input(s): LIPASE, AMYLASE in the last 8760 hours. No results for input(s): AMMONIA in the last 8760 hours. CBC:  Recent Labs  12/07/14 0550 12/29/14 1242 04/12/15 1533  WBC 12.8* 9.4 10.3  NEUTROABS  --  6.3 6.2  HGB 12.5 13.5 14.3  HCT 41.4 40.1 43.2  MCV 92.0 85.4 86.2  PLT 315 259.0 276.0   Lipid Panel:  Recent Labs  12/01/14 1210  CHOL 195  HDL 56  LDLCALC 122*  TRIG 83  CHOLHDL 3.5   Lab Results  Component Value Date   HGBA1C 5.6 04/12/2015    Procedures since last visit:  04/18/15 bone density:  Low bone mass   Assessment/Plan 1. Other seasonal allergic rhinitis -advised to change to zyrtec and get otc flonase in place of the ipratropium bromide spray to help with her nasal drainage  2. Memory disorder -seems she has mild cognitive impairment -her brother helps her out a lot -she is tolerating namenda and aricept (namzaric was too expensive)  3. Chronic diastolic congestive heart failure -cont lasix, cozaar, and bystolic -seems stable, does well with edema when elevates feet and follows low sodium diet  4. Vitamin D deficiency -advised to caltrate with D two tabs daily and vitamin D 2000 units daily for osteopenia also -stop the drisdol  5. Essential hypertension -bp at goal with current therapy  6. Hyperlipidemia -lipids  satisfactory with welchol  7. Osteopenia -bone density reviewed with them and ca with D and D advised  Next appt:  3 mos with Janett Billow  Nikala Walsworth L. Tri Chittick, D.O. [facility] [address], [place] [address] Cell Phone (Mon-Fri 8am-5pm):  [phone] On Call:  [phone] & follow prompts after 5pm & weekends Office Phone:  [phone] Office Fax:  [phone]

## 2015-05-21 NOTE — Telephone Encounter (Signed)
Patient's brother was called and informed that she should be taking vitamin D and calcium supplement.

## 2015-05-24 ENCOUNTER — Ambulatory Visit (INDEPENDENT_AMBULATORY_CARE_PROVIDER_SITE_OTHER): Payer: Medicare Other | Admitting: Internal Medicine

## 2015-05-24 ENCOUNTER — Encounter: Payer: Self-pay | Admitting: Internal Medicine

## 2015-05-24 VITALS — BP 104/54 | HR 75 | Ht 65.0 in | Wt 171.4 lb

## 2015-05-24 DIAGNOSIS — E785 Hyperlipidemia, unspecified: Secondary | ICD-10-CM | POA: Diagnosis not present

## 2015-05-24 DIAGNOSIS — I6529 Occlusion and stenosis of unspecified carotid artery: Secondary | ICD-10-CM | POA: Diagnosis not present

## 2015-05-24 LAB — LIPID PANEL
CHOL/HDL RATIO: 4
Cholesterol: 228 mg/dL — ABNORMAL HIGH (ref 0–200)
HDL: 59.1 mg/dL (ref >39.00)
NONHDL: 168.9
Triglycerides: 234 mg/dL — ABNORMAL HIGH (ref 0.0–149.0)
VLDL: 46.8 mg/dL — AB (ref 0.0–40.0)

## 2015-05-24 LAB — LDL CHOLESTEROL, DIRECT: Direct LDL: 142 mg/dL

## 2015-05-24 NOTE — Patient Instructions (Signed)
Your physician recommends that you continue on your current medications as directed. Please refer to the Current Medication list given to you today.  Your physician recommends that you return for lab work in: today ([place])  Your physician recommends that you schedule a follow-up appointment in: early September 2016 with Dr. Harrington Challenger.

## 2015-05-24 NOTE — Progress Notes (Signed)
Cardiology Office Note   Date:  05/24/2015   ID:  Virginia [name], DOB 09-26-1936, MRN [id number]  PCP:  Virginia Chandler, NP  Cardiologist:   Dorris Carnes, MD   No chief complaint on file.     History of Present Illness: Virginia [name] is a 79 y.o. female with a history of Virginia [name] is a 79 y.o. female with a history of HLD, HTN, carotid artery disease s/p L CEA, dementia, and chronic diastolic CHF who was added on to my schedule today for evaluation of SOB. I saw the pt in April  And again with Virginia [name] in May    No swelling  Beathing OK     Has draiage in back of throad  Bp will go up and down depending on lasix use   No dizziness   Current Outpatient Prescriptions  Medication Sig Dispense Refill  . ARTIFICIAL TEAR OP Place 2 drops into both eyes daily.     Marland Kitchen aspirin 81 MG tablet Take 81 mg by mouth daily.    Marland Kitchen b complex vitamins tablet Take 1 tablet by mouth daily.    . Biotin 5000 MCG TABS Take 1 tablet by mouth daily.    . Cholecalciferol (VITAMIN D) 2000 UNITS CAPS Take 1 capsule by mouth daily.    Marland Kitchen co-enzyme Q-10 50 MG capsule Take 100 mg by mouth daily.     . colesevelam (WELCHOL) 625 MG tablet Take 3 tablets (1,875 mg total) by mouth 2 (two) times daily with a meal. With Lunch and Dinner 180 tablet 3  . donepezil (ARICEPT) 10 MG tablet Take 1 tablet (10 mg total) by mouth at bedtime. 30 tablet 5  . furosemide (LASIX) 40 MG tablet Take 1.5 tablets (60 mg total) by mouth every other day. 30 tablet   . ipratropium (ATROVENT) 0.03 % nasal spray Place 2 sprays into both nostrils every 12 (twelve) hours. 30 mL 12  . losartan (COZAAR) 50 MG tablet Take one tablet by mouth once daily for blood pressure 30 tablet 3  . memantine (NAMENDA) 10 MG tablet Take 1 tablet (10 mg total) by mouth 2 (two) times daily. 60 tablet 5  . Multiple Vitamins-Minerals (ALIVE WOMENS 50+ PO) Take 1 tablet by mouth daily. ([place] 50+)    . Multiple Vitamins-Minerals (PRESERVISION AREDS  2) CAPS Take 1 capsule by mouth daily.    . nebivolol (BYSTOLIC) 5 MG tablet Take 5 mg by mouth daily.    . Phosphatidylserine-DHA-EPA (VAYACOG) 100-19.5-6.5 MG CAPS Take 1 tablet by mouth daily. 30 capsule 5  . saccharomyces boulardii (FLORASTOR) 250 MG capsule Take 250 mg by mouth 2 (two) times a week.    . Vitamin D, Ergocalciferol, (DRISDOL) 50000 UNITS CAPS Take 50,000 Units by mouth every 7 (seven) days. On Fridays     No current facility-administered medications for this visit.    Allergies:   Lidocaine   Past Medical History  Diagnosis Date  . Hyperlipidemia   . Hypertension   . Carotid artery occlusion   . Fall at home Sept. 2013  . Memory disorder 10/24/2014    Past Surgical History  Procedure Laterality Date  . Carotid endarterectomy      left CEA  . Tonsillectomy    . Appendectomy    . Breast reduction surgery    . Skin cancer removal    . Cataract extraction Bilateral   . Colonoscopy  2015  . Eye surgery  cataracts removed, bilaterally      Social History:  The patient  reports that she quit smoking about 20 years ago. Her smoking use included Cigarettes. She quit after 14 years of use. She has never used smokeless tobacco. She reports that she does not drink alcohol or use illicit drugs.   Family History:  The patient's family history includes Cancer (age of onset: 1) in her mother; Cancer (age of onset: 32) in her maternal aunt; Dementia in her brother; Diabetes in her maternal aunt and maternal grandmother; Heart attack in her father; Heart disease in her father; Heart failure in her maternal grandmother; Hypertension in her father. There is no history of Stroke.    ROS:  Please see the history of present illness. All other systems are reviewed and  Negative to the above problem except as noted.    PHYSICAL EXAM: VS:  BP 104/54 mmHg  Pulse 75  Ht 5\' 5"  (1.651 m)  Wt 171 lb 6.4 oz (77.747 kg)  BMI 28.52 kg/m2  SpO2 95%  GEN: Well nourished, well  developed, in no acute distress HEENT: normal Neck: no JVD, carotid bruits, or masses Cardiac: RRR; no murmurs, rubs, or gallops,no edema  Respiratory:  clear to auscultation bilaterally, normal work of breathing GI: soft, nontender, nondistended, + BS  No hepatomegaly  MS: no deformity Moving all extremities   Skin: warm and dry, no rash Neuro:  Strength and sensation are intact Psych: euthymic mood, full affect   EKG:  EKG is not ordered today.   Lipid Panel    Component Value Date/Time   CHOL 195 12/01/2014 1210   TRIG 83 12/01/2014 1210   HDL 56 12/01/2014 1210   CHOLHDL 3.5 12/01/2014 1210   VLDL 17 12/01/2014 1210   LDLCALC 122* 12/01/2014 1210      Wt Readings from Last 3 Encounters:  05/24/15 171 lb 6.4 oz (77.747 kg)  05/17/15 171 lb 3.2 oz (77.656 kg)  05/11/15 170 lb (77.111 kg)      ASSESSMENT AND PLAN:  1  Chronic diastolic CHF   Status is OK  2  HN  BP is good  Not dizzy  Can back down on bystolic if low and dizzy  3.  HL Check lipids today  Not on statin    4.  CV diseas  S/P CEA  5.  Dementia  Followed in [place]  6  Drainage  Just started flonase  Takes time to work  F/u with primary MD      Disposition:   FU with me in September  Signed, Dorris Carnes, MD  05/24/2015 10:30 AM    [address], [place], [place]  [address] Phone: [phone]; Fax: [phone]

## 2015-05-31 ENCOUNTER — Telehealth: Payer: Self-pay | Admitting: *Deleted

## 2015-05-31 DIAGNOSIS — E785 Hyperlipidemia, unspecified: Secondary | ICD-10-CM

## 2015-05-31 MED ORDER — ATORVASTATIN CALCIUM 20 MG PO TABS: 20.0000 mg | ORAL_TABLET | Freq: Every day | ORAL | Status: DC

## 2015-05-31 NOTE — Telephone Encounter (Signed)
-----   Message from Dorris Carnes V, MD sent at 05/27/2015 12:42 PM EDT ----- Pt on welchol    LDL needs to be lower  WOuld add lipitor 20mg   F/U in 8 wks with AST   Can stop Welchol

## 2015-05-31 NOTE — Telephone Encounter (Signed)
Spoke with patient.

## 2015-06-04 ENCOUNTER — Other Ambulatory Visit: Payer: Self-pay | Admitting: Internal Medicine

## 2015-06-05 ENCOUNTER — Other Ambulatory Visit: Payer: Self-pay

## 2015-06-05 MED ORDER — LOSARTAN POTASSIUM 50 MG PO TABS: ORAL_TABLET | ORAL | Status: DC

## 2015-06-05 MED ORDER — FUROSEMIDE 40 MG PO TABS: 60.0000 mg | ORAL_TABLET | ORAL | Status: DC

## 2015-06-06 ENCOUNTER — Telehealth: Payer: Self-pay | Admitting: Internal Medicine

## 2015-06-06 NOTE — Telephone Encounter (Signed)
Pt c/o medication issue:  1. Name of Medication: Welchol and torvastatin  4. What is your medication issue? Daughter called req a call back to discuss medications. She states that the pt was advised to discontinue the [facility] and that the medication torvastatin was added. Daughter req a call back to verfy if the welchol was intended to be discontinued or is she supposed to take both. Also, the daughter has other questions. 1. What are the actual numbers of her Cholesterol. 2. What stages of CHF does she have. 3. What class of CHF does the patient have and 4. What are the goals for her care. Please call back to discuss.

## 2015-06-06 NOTE — Telephone Encounter (Signed)
Reviewed received message ROI confirmed for daughter [name] and son [name] son [name] that his mother, Virginia [name] is to stop Welchol and add Lipitor 20 mg daily.  I told him the other questions his sister, [name], [name] has about her actual cholesterol numbers, what stage of CHF she has, what class of CHF does she have, What are the goals of her care would be referred to Dr. Harrington Challenger and her nurse Caren Hazy so they can answer these questions. Both provider and RN off today.

## 2015-06-12 NOTE — Telephone Encounter (Signed)
Please send copy of lipids to family Pt has diastolic CHF (heart pumps forward fine;  Slower to relax)  I would say mild Continue medical Rx

## 2015-06-13 NOTE — Telephone Encounter (Signed)
Called back to number provided for patient's daughter.   Voicemail is full.   Unable to leave message. Mobile number rang, no answer. Need address for family to send copy of lipids.

## 2015-06-15 NOTE — Telephone Encounter (Signed)
Mailed to address on file for patient.

## 2015-07-05 ENCOUNTER — Telehealth: Payer: Self-pay | Admitting: Internal Medicine

## 2015-07-05 ENCOUNTER — Telehealth: Payer: Self-pay | Admitting: *Deleted

## 2015-07-05 NOTE — Telephone Encounter (Signed)
Spoke with pt's brother, [name]. States that pt would like a referral to an ENT specialist. Pt is having problems with PND and throat irritation. She has been using fluticasone nasal spray and OTC allergy meds with no relief. Duffy Rhody that MW was out of the office and it would be Monday before he would address this message. Also advised that normally for referrals of this nature, the pt's PCP would handle it. He was insistant that MW be the one to do the referral.  MW - please advise. Thanks

## 2015-07-05 NOTE — Telephone Encounter (Signed)
I haven't seen her in 6 months and don't refer this kind of pt to ent - what we normally do is a sinus ct and labs to see if there is an obvious easily fixable problem first then do  ENT if approp (maybe 10% of the time it does prove to be, but 90% it doesn't need to be done)  If she does return rec all meds in hand to make sure we have her med list 100% correct

## 2015-07-05 NOTE — Telephone Encounter (Signed)
lmtcb x1 

## 2015-07-05 NOTE — Telephone Encounter (Signed)
Patient wants referral to ENT for runny nose. Scheduled her an appointment to see Sherrie Mustache on 07/17/2015. She agreed.

## 2015-07-06 NOTE — Telephone Encounter (Signed)
lmtcb x2 for pt's brother.

## 2015-07-09 NOTE — Telephone Encounter (Signed)
Called and spoke to pt. Pt stated her PND has now resolved and does not want to come in for an appt. Advised pt if things change she can call back to schedule appt. Pt verbalized understanding and denied any further questions or concerns at this time.

## 2015-07-17 ENCOUNTER — Ambulatory Visit (INDEPENDENT_AMBULATORY_CARE_PROVIDER_SITE_OTHER): Payer: Medicare Other | Admitting: Nurse Practitioner

## 2015-07-17 ENCOUNTER — Encounter: Payer: Self-pay | Admitting: Nurse Practitioner

## 2015-07-17 VITALS — BP 130/86 | HR 64 | Temp 97.8°F | Resp 20 | Ht 65.0 in | Wt 172.4 lb

## 2015-07-17 DIAGNOSIS — I6529 Occlusion and stenosis of unspecified carotid artery: Secondary | ICD-10-CM | POA: Diagnosis not present

## 2015-07-17 DIAGNOSIS — J3489 Other specified disorders of nose and nasal sinuses: Secondary | ICD-10-CM | POA: Diagnosis not present

## 2015-07-17 NOTE — Progress Notes (Signed)
Patient ID: Virginia [name], female   DOB: [birth year], 79 y.o.   MRN: [id number]    PCP: Lauree Chandler, NP  Allergies  Allergen Reactions  . Lidocaine Anaphylaxis, Swelling, Rash and Other (See Comments)    Any of the " Schle[place] "    Chief Complaint  Patient presents with  . Acute Visit    needs a referral for ENT     HPI: Patient is a 79 y.o. female seen in the office today due to allergies, pt was seen in June by Dr Mariea Clonts who recommended changing to zyrtec and Flonase to help with seasonal allergies which did not help at all. Last year has dermatology procedure and that is when it started. No post nasal drip, no sore throat, no cough or congestion. No shortness of breath or fever.  Worse when moving around. Better when sitting down or laying down. No headache. Review of Systems:  Review of Systems  Constitutional: Negative for fever.  HENT: Positive for rhinorrhea. Negative for hearing loss, postnasal drip, sinus pressure, sneezing and sore throat.   Eyes:       Glasses  Respiratory: Negative for cough and shortness of breath.   Cardiovascular: Negative for chest pain.  Gastrointestinal: Negative for abdominal pain, constipation and blood in stool.  Psychiatric/Behavioral: Positive for confusion (memory loss).    Past Medical History  Diagnosis Date  . Hyperlipidemia   . Hypertension   . Carotid artery occlusion   . Fall at home Sept. 2013  . Memory disorder 10/24/2014   Past Surgical History  Procedure Laterality Date  . Carotid endarterectomy      left CEA  . Tonsillectomy    . Appendectomy    . Breast reduction surgery    . Skin cancer removal    . Cataract extraction Bilateral   . Colonoscopy  2015  . Eye surgery      cataracts removed, bilaterally    Social History:   reports that she quit smoking about 20 years ago. Her smoking use included Cigarettes. She quit after 14 years of use. She has never used smokeless tobacco. She reports that she does  not drink alcohol or use illicit drugs.  Family History  Problem Relation Age of Onset  . Heart disease Father     Before age 64  . Hypertension Father   . Heart attack Father   . Cancer Mother 69    Brain  . Dementia Brother   . Heart failure Maternal Grandmother   . Diabetes Maternal Grandmother   . Stroke Neg Hx   . Cancer Maternal Aunt 61    breast cancer  . Diabetes Maternal Aunt     Medications: Patient's Medications  New Prescriptions   No medications on file  Previous Medications   ARTIFICIAL TEAR OP    Place 2 drops into both eyes daily.    ASPIRIN 81 MG TABLET    Take 81 mg by mouth daily.   ATORVASTATIN (LIPITOR) 20 MG TABLET    Take 1 tablet (20 mg total) by mouth daily.   B COMPLEX VITAMINS TABLET    Take 1 tablet by mouth daily.   BIOTIN 5000 MCG TABS    Take 1 tablet by mouth daily.   CHOLECALCIFEROL (VITAMIN D) 2000 UNITS CAPS    Take 1 capsule by mouth daily.   CO-ENZYME Q-10 50 MG CAPSULE    Take 100 mg by mouth daily.    DONEPEZIL (ARICEPT) 10 MG  TABLET    Take 1 tablet (10 mg total) by mouth at bedtime.   FUROSEMIDE (LASIX) 40 MG TABLET    Take 1.5 tablets (60 mg total) by mouth every other day.   IPRATROPIUM (ATROVENT) 0.03 % NASAL SPRAY    Place 2 sprays into both nostrils every 12 (twelve) hours.   LOSARTAN (COZAAR) 50 MG TABLET    Take one tablet by mouth once daily for blood pressure   MEMANTINE (NAMENDA) 10 MG TABLET    Take 1 tablet (10 mg total) by mouth 2 (two) times daily.   MULTIPLE VITAMINS-MINERALS (ALIVE WOMENS 50+ PO)    Take 1 tablet by mouth daily. (ALIVE WOMENS 50+)   MULTIPLE VITAMINS-MINERALS (PRESERVISION AREDS 2) CAPS    Take 1 capsule by mouth daily.   NEBIVOLOL (BYSTOLIC) 5 MG TABLET    Take 5 mg by mouth daily.   PHOSPHATIDYLSERINE-DHA-EPA (VAYACOG) 100-19.5-6.5 MG CAPS    Take 1 tablet by mouth daily.   SACCHAROMYCES BOULARDII (FLORASTOR) 250 MG CAPSULE    Take 250 mg by mouth 2 (two) times a week.   VITAMIN D, ERGOCALCIFEROL,  (DRISDOL) 50000 UNITS CAPS    Take 50,000 Units by mouth every 7 (seven) days. On Fridays  Modified Medications   No medications on file  Discontinued Medications   No medications on file     Physical Exam:  Filed Vitals:   07/17/15 1434  BP: 130/86  Pulse: 64  Temp: 97.8 F (36.6 C)  TempSrc: Oral  Resp: 20  Height: 5\' 5"  (1.651 m)  Weight: 172 lb 6.4 oz (78.2 kg)  SpO2: 90%    Physical Exam  Constitutional: She appears well-developed and well-nourished.  HENT:  Head: Normocephalic and atraumatic.  Right Ear: External ear normal.  Left Ear: External ear normal.  Nose: Nose normal.  Mouth/Throat: Oropharynx is clear and moist. No oropharyngeal exudate.  Eyes: Conjunctivae and EOM are normal. Pupils are equal, round, and reactive to light.  Neck: Normal range of motion. Neck supple.  Cardiovascular: Normal rate, regular rhythm and normal heart sounds.   Pulmonary/Chest: Effort normal and breath sounds normal.  Skin: Skin is warm and dry.    Labs reviewed: Basic Metabolic Panel:  Recent Labs  12/07/14 1045  12/09/14 0435  12/29/14 1242  04/12/15 1533 04/17/15 1127 05/15/15 1453  NA  --   < > 137  < > 140  < > 137 138 142  K  --   < > 3.7  < > 3.9  < > 4.4 4.1 4.4  CL  --   < > 85*  < > 104  < > 100 102 99  CO2  --   < > 45*  < > 25  < > 32 32 29  GLUCOSE  --   < > 104*  < > 96  < > 88 83 88  BUN  --   < > 23  < > 20  < > 20 25* 19  CREATININE  --   < > 0.85  < > 1.08  < > 1.01 1.06 1.14*  CALCIUM  --   < > 8.3*  < > 9.0  < > 9.3 9.0 9.2  MG  --   --  2.0  --   --   --   --   --   --   TSH 0.946  --   --   --  1.00  --   --   --   --   < > =  values in this interval not displayed. Liver Function Tests:  Recent Labs  12/03/14 0500 12/07/14 0550 12/29/14 1242  AST 30 19 22   ALT 53* 26 17  ALKPHOS 73 44 68  BILITOT 0.7 0.8 0.9  PROT 6.0 5.4* 6.4  ALBUMIN 2.9* 2.5* 3.4*   No results for input(s): LIPASE, AMYLASE in the last 8760 hours. No results for  input(s): AMMONIA in the last 8760 hours. CBC:  Recent Labs  12/07/14 0550 12/29/14 1242 04/12/15 1533  WBC 12.8* 9.4 10.3  NEUTROABS  --  6.3 6.2  HGB 12.5 13.5 14.3  HCT 41.4 40.1 43.2  MCV 92.0 85.4 86.2  PLT 315 259.0 276.0   Lipid Panel:  Recent Labs  12/01/14 1210 05/24/15 1108  CHOL 195 228*  HDL 56 59.10  LDLCALC 122*  --   TRIG 83 234.0*  CHOLHDL 3.5 4  LDLDIRECT  --  142.0   TSH:  Recent Labs  12/07/14 1045 12/29/14 1242  TSH 0.946 1.00   A1C: Lab Results  Component Value Date   HGBA1C 5.6 04/12/2015     Assessment/Plan 1. Rhinorrhea persistent rhinorrhea for over a year since [facility] procedure.  Pt has been treated with multiple allergy medication PO and intranasally without benefit.  - Ambulatory referral to ENT for further evaluation  Krishauna Schatzman K. Harle Battiest  [facility] [phone] 8 am - 5 pm) [phone] (after hours)

## 2015-07-17 NOTE — Patient Instructions (Signed)
Will get referral to ENT Need to find out where your procedure was done so we can obtain these records to sent with referral

## 2015-08-01 ENCOUNTER — Other Ambulatory Visit (INDEPENDENT_AMBULATORY_CARE_PROVIDER_SITE_OTHER): Payer: Medicare Other | Admitting: *Deleted

## 2015-08-01 DIAGNOSIS — E785 Hyperlipidemia, unspecified: Secondary | ICD-10-CM | POA: Diagnosis not present

## 2015-08-01 LAB — LIPID PANEL
CHOLESTEROL: 167 mg/dL (ref 0–200)
HDL: 61.4 mg/dL (ref >39.00)
LDL Cholesterol: 81 mg/dL (ref 0–99)
NONHDL: 105.16
Total CHOL/HDL Ratio: 3
Triglycerides: 119 mg/dL (ref 0.0–149.0)
VLDL: 23.8 mg/dL (ref 0.0–40.0)

## 2015-08-01 LAB — AST: AST: 28 U/L (ref 0–37)

## 2015-08-02 DIAGNOSIS — J310 Chronic rhinitis: Secondary | ICD-10-CM | POA: Diagnosis not present

## 2015-08-02 DIAGNOSIS — Z79899 Other long term (current) drug therapy: Secondary | ICD-10-CM | POA: Diagnosis not present

## 2015-08-02 DIAGNOSIS — Z87891 Personal history of nicotine dependence: Secondary | ICD-10-CM | POA: Diagnosis not present

## 2015-08-02 DIAGNOSIS — J3489 Other specified disorders of nose and nasal sinuses: Secondary | ICD-10-CM | POA: Diagnosis not present

## 2015-08-02 DIAGNOSIS — R0982 Postnasal drip: Secondary | ICD-10-CM | POA: Diagnosis not present

## 2015-08-16 ENCOUNTER — Ambulatory Visit (INDEPENDENT_AMBULATORY_CARE_PROVIDER_SITE_OTHER): Payer: Medicare Other | Admitting: Nurse Practitioner

## 2015-08-16 ENCOUNTER — Encounter: Payer: Self-pay | Admitting: Nurse Practitioner

## 2015-08-16 VITALS — BP 102/70 | HR 67 | Temp 98.3°F | Resp 20 | Ht 65.0 in | Wt 176.6 lb

## 2015-08-16 DIAGNOSIS — R413 Other amnesia: Secondary | ICD-10-CM | POA: Diagnosis not present

## 2015-08-16 DIAGNOSIS — I6529 Occlusion and stenosis of unspecified carotid artery: Secondary | ICD-10-CM

## 2015-08-16 DIAGNOSIS — K219 Gastro-esophageal reflux disease without esophagitis: Secondary | ICD-10-CM | POA: Diagnosis not present

## 2015-08-16 DIAGNOSIS — M25511 Pain in right shoulder: Secondary | ICD-10-CM | POA: Diagnosis not present

## 2015-08-16 DIAGNOSIS — J3489 Other specified disorders of nose and nasal sinuses: Secondary | ICD-10-CM

## 2015-08-16 DIAGNOSIS — I5032 Chronic diastolic (congestive) heart failure: Secondary | ICD-10-CM | POA: Diagnosis not present

## 2015-08-16 DIAGNOSIS — I10 Essential (primary) hypertension: Secondary | ICD-10-CM

## 2015-08-16 DIAGNOSIS — Z23 Encounter for immunization: Secondary | ICD-10-CM | POA: Diagnosis not present

## 2015-08-16 NOTE — Patient Instructions (Signed)
May stop taking prevacid for acid reflux-- if heartburn gets worse may resume daily  To use ice to right shoulder up to 3 days a day- after you ice can apply muscle rub (biofreeze, ben gay, asper cream) to affected area  Will get blood work today  Follow up in 5 months

## 2015-08-16 NOTE — Progress Notes (Signed)
Patient ID: Virginia [name], female   DOB: 08-18-1936, 79 y.o.   MRN: [id number]    PCP: Lauree Chandler, NP  Advanced Directive information Does patient have an advance directive?: No, Would patient like information on creating an advanced directive?: Yes - Educational materials given  Allergies  Allergen Reactions  . Lidocaine Anaphylaxis, Swelling, Rash and Other (See Comments)    Any of the " Boy[place] "    Chief Complaint  Patient presents with  . Medical Management of Chronic Issues    3 month follow-up Hypertension,CHF,Hyperlipidemia  . Immunizations    Will take flu shot  . OTHER    Discuss Advanced Directive     HPI: Patient is a 79 y.o. female seen in the office today for routine follow up. Pt with a pmh of dementia, htn, hyperlipidemia, CAD, allergic rhinitis, CHF, vit d def, OP. Pt was seen by ENT due to persistent rhinorrhea.  Started on ipratropium bromide nasal spray three times daily which is helping. Follow up with ENT on Oct 19th. Needing a sample of drainage but they are unable to get.  Doing chair yoga and instructors are noting some soreness to the right shoulder. Limiting her ROM, tender. Has been going on for over a year. Hurt her shoulder in early 90s but after she left rehab had improved but now has gotten worse. Has not needed to take anything for pain.   Question about prevacid- was taking twice daily while in rehab, now only taking once daily- no symptoms of acid reflux.   Eating and drinking well. No anxiety or depression. Sleeping well at night.   Review of Systems:  Review of Systems  Constitutional: Negative for fever, activity change and appetite change.  HENT: Positive for rhinorrhea. Negative for hearing loss, postnasal drip, sinus pressure, sneezing and sore throat.   Eyes:       Glasses  Respiratory: Negative for cough and shortness of breath.   Cardiovascular: Negative for chest pain, palpitations and leg swelling.  Gastrointestinal:  Negative for abdominal pain, constipation and blood in stool.  Genitourinary: Negative for difficulty urinating.  Musculoskeletal: Positive for arthralgias (worse pain to right shoulder).  Psychiatric/Behavioral: Positive for confusion (memory loss).    Past Medical History  Diagnosis Date  . Hyperlipidemia   . Hypertension   . Carotid artery occlusion   . Fall at home Sept. 2013  . Memory disorder 10/24/2014   Past Surgical History  Procedure Laterality Date  . Carotid endarterectomy      left CEA  . Tonsillectomy    . Appendectomy    . Breast reduction surgery    . Skin cancer removal    . Cataract extraction Bilateral   . Colonoscopy  2015  . Eye surgery      cataracts removed, bilaterally    Social History:   reports that she quit smoking about 20 years ago. Her smoking use included Cigarettes. She quit after 14 years of use. She has never used smokeless tobacco. She reports that she does not drink alcohol or use illicit drugs.  Family History  Problem Relation Age of Onset  . Heart disease Father     Before age 75  . Hypertension Father   . Heart attack Father   . Cancer Mother 45    Brain  . Dementia Brother   . Heart failure Maternal Grandmother   . Diabetes Maternal Grandmother   . Stroke Neg Hx   . Cancer Maternal Aunt  61    breast cancer  . Diabetes Maternal Aunt     Medications: Patient's Medications  New Prescriptions   No medications on file  Previous Medications   ARTIFICIAL TEAR OP    Place 2 drops into both eyes daily.    ASPIRIN 81 MG TABLET    Take 81 mg by mouth daily.   ATORVASTATIN (LIPITOR) 20 MG TABLET    Take 1 tablet (20 mg total) by mouth daily.   B COMPLEX VITAMINS TABLET    Take 1 tablet by mouth daily.   BIOTIN 5000 MCG TABS    Take 1 tablet by mouth daily.   CHOLECALCIFEROL (VITAMIN D) 2000 UNITS CAPS    Take 1 capsule by mouth daily.   CO-ENZYME Q-10 50 MG CAPSULE    Take 100 mg by mouth daily.    DONEPEZIL (ARICEPT) 10 MG  TABLET    Take 1 tablet (10 mg total) by mouth at bedtime.   FUROSEMIDE (LASIX) 40 MG TABLET    Take 1.5 tablets (60 mg total) by mouth every other day.   LANSOPRAZOLE (PREVACID) 15 MG CAPSULE    Take 15 mg by mouth daily at 12 noon.   LOSARTAN (COZAAR) 50 MG TABLET    Take one tablet by mouth once daily for blood pressure   MEMANTINE (NAMENDA) 10 MG TABLET    Take 1 tablet (10 mg total) by mouth 2 (two) times daily.   MULTIPLE VITAMINS-MINERALS (ALIVE WOMENS 50+ PO)    Take 1 tablet by mouth daily. (ALIVE WOMENS 50+)   MULTIPLE VITAMINS-MINERALS (PRESERVISION AREDS 2) CAPS    Take 1 capsule by mouth daily.   NEBIVOLOL (BYSTOLIC) 5 MG TABLET    Take 5 mg by mouth daily.   PHOSPHATIDYLSERINE-DHA-EPA (VAYACOG) 100-19.5-6.5 MG CAPS    Take 1 tablet by mouth daily.   SACCHAROMYCES BOULARDII (FLORASTOR) 250 MG CAPSULE    Take 250 mg by mouth 2 (two) times a week.  Modified Medications   No medications on file  Discontinued Medications   IPRATROPIUM (ATROVENT) 0.03 % NASAL SPRAY    Place 2 sprays into both nostrils every 12 (twelve) hours.   VITAMIN D, ERGOCALCIFEROL, (DRISDOL) 50000 UNITS CAPS    Take 50,000 Units by mouth every 7 (seven) days. On Fridays     Physical Exam:  Filed Vitals:   08/16/15 1546  BP: 102/70  Pulse: 67  Temp: 98.3 F (36.8 C)  TempSrc: Oral  Resp: 20  Height: 5\' 5"  (1.651 m)  Weight: 176 lb 9.6 oz (80.105 kg)  SpO2: 93%   Body mass index is 29.39 kg/(m^2).  Physical Exam  Constitutional: She appears well-developed and well-nourished.  HENT:  Head: Normocephalic and atraumatic.  Right Ear: External ear normal.  Left Ear: External ear normal.  Nose: Nose normal.  Mouth/Throat: Oropharynx is clear and moist. No oropharyngeal exudate.  Eyes: Conjunctivae and EOM are normal. Pupils are equal, round, and reactive to light.  Neck: Normal range of motion. Neck supple.  Cardiovascular: Normal rate, regular rhythm and normal heart sounds.   Pulmonary/Chest:  Effort normal and breath sounds normal.  Abdominal: Soft. Bowel sounds are normal. She exhibits no distension.  Musculoskeletal: She exhibits tenderness (to right upper arm, limited ROM to shoulder). She exhibits no edema.  Neurological: She is alert.  Skin: Skin is warm and dry.  Psychiatric: She has a normal mood and affect.    Labs reviewed: Basic Metabolic Panel:  Recent Labs  12/07/14 1045  12/09/14  5993  12/29/14 1242  04/12/15 1533 04/17/15 1127 05/15/15 1453  NA  --   < > 137  < > 140  < > 137 138 142  K  --   < > 3.7  < > 3.9  < > 4.4 4.1 4.4  CL  --   < > 85*  < > 104  < > 100 102 99  CO2  --   < > 45*  < > 25  < > 32 32 29  GLUCOSE  --   < > 104*  < > 96  < > 88 83 88  BUN  --   < > 23  < > 20  < > 20 25* 19  CREATININE  --   < > 0.85  < > 1.08  < > 1.01 1.06 1.14*  CALCIUM  --   < > 8.3*  < > 9.0  < > 9.3 9.0 9.2  MG  --   --  2.0  --   --   --   --   --   --   TSH 0.946  --   --   --  1.00  --   --   --   --   < > = values in this interval not displayed. Liver Function Tests:  Recent Labs  12/03/14 0500 12/07/14 0550 12/29/14 1242 08/01/15 0857  AST 30 19 22 28   ALT 53* 26 17  --   ALKPHOS 73 44 68  --   BILITOT 0.7 0.8 0.9  --   PROT 6.0 5.4* 6.4  --   ALBUMIN 2.9* 2.5* 3.4*  --    No results for input(s): LIPASE, AMYLASE in the last 8760 hours. No results for input(s): AMMONIA in the last 8760 hours. CBC:  Recent Labs  12/07/14 0550 12/29/14 1242 04/12/15 1533  WBC 12.8* 9.4 10.3  NEUTROABS  --  6.3 6.2  HGB 12.5 13.5 14.3  HCT 41.4 40.1 43.2  MCV 92.0 85.4 86.2  PLT 315 259.0 276.0   Lipid Panel:  Recent Labs  12/01/14 1210 05/24/15 1108 08/01/15 0857  CHOL 195 228* 167  HDL 56 59.10 61.40  LDLCALC 122*  --  81  TRIG 83 234.0* 119.0  CHOLHDL 3.5 4 3   LDLDIRECT  --  142.0  --    TSH:  Recent Labs  12/07/14 1045 12/29/14 1242  TSH 0.946 1.00   A1C: Lab Results  Component Value Date   HGBA1C 5.6 04/12/2015      Assessment/Plan 1. Chronic diastolic congestive heart failure -euvolemic, cont lasix 60 mg every other day with losartan and bystolic.  -ongoing follow up with Cardiologist.  - Basic metabolic panel  2. Essential hypertension -well controlled on current regimen, to cont losartan, lasix, bystolic - CBC with Differential - Basic metabolic panel  3. Rhinorrhea -improved on Atrovent nasal spray three times daily, has follow up with ENT in October   4. Memory disorder Remains stable, conts on namenda and aricept, reports vivid dreams at night but they do not bother her. Educated that she can take her aricept in the morning if dreams start to bother her.   5. Gastroesophageal reflux disease without esophagitis To stop prevacid due to no GERD symptoms, may resume if symptoms worsen  6. Right shoulder pain To use ice to right shoulder up to 3 days a day- after ice can apply muscle rub to affected area -OT consult to evaluate and treat due to  decreased ROM and pain   Follow up in 5 months for routine follow up   Wilmar. Harle Battiest  [facility] [phone] 8 am - 5 pm) [phone] (after hours)

## 2015-08-17 LAB — BASIC METABOLIC PANEL
BUN/Creatinine Ratio: 18 (ref 11–26)
BUN: 19 mg/dL (ref 8–27)
CALCIUM: 9.1 mg/dL (ref 8.7–10.3)
CO2: 27 mmol/L (ref 18–29)
CREATININE: 1.05 mg/dL — AB (ref 0.57–1.00)
Chloride: 99 mmol/L (ref 97–108)
GFR, EST AFRICAN AMERICAN: 59 mL/min/{1.73_m2} — AB (ref >59)
GFR, EST NON AFRICAN AMERICAN: 51 mL/min/{1.73_m2} — AB (ref >59)
Glucose: 75 mg/dL (ref 65–99)
Potassium: 4.9 mmol/L (ref 3.5–5.2)
Sodium: 142 mmol/L (ref 134–144)

## 2015-08-17 LAB — CBC WITH DIFFERENTIAL/PLATELET
BASOS: 0 %
Basophils Absolute: 0 10*3/uL (ref 0.0–0.2)
EOS (ABSOLUTE): 0.2 10*3/uL (ref 0.0–0.4)
Eos: 2 %
HEMOGLOBIN: 13.5 g/dL (ref 11.1–15.9)
Hematocrit: 40.9 % (ref 34.0–46.6)
IMMATURE GRANS (ABS): 0 10*3/uL (ref 0.0–0.1)
Immature Granulocytes: 0 %
LYMPHS: 28 %
Lymphocytes Absolute: 2.3 10*3/uL (ref 0.7–3.1)
MCH: 28.7 pg (ref 26.6–33.0)
MCHC: 33 g/dL (ref 31.5–35.7)
MCV: 87 fL (ref 79–97)
MONOCYTES: 9 %
Monocytes Absolute: 0.8 10*3/uL (ref 0.1–0.9)
NEUTROS ABS: 5 10*3/uL (ref 1.4–7.0)
Neutrophils: 61 %
Platelets: 263 10*3/uL (ref 150–379)
RBC: 4.7 x10E6/uL (ref 3.77–5.28)
RDW: 14.1 % (ref 12.3–15.4)
WBC: 8.3 10*3/uL (ref 3.4–10.8)

## 2015-08-21 ENCOUNTER — Ambulatory Visit: Payer: Medicare Other | Attending: Nurse Practitioner | Admitting: Physical Therapy

## 2015-08-24 ENCOUNTER — Encounter: Payer: Self-pay | Admitting: Internal Medicine

## 2015-08-24 ENCOUNTER — Ambulatory Visit (INDEPENDENT_AMBULATORY_CARE_PROVIDER_SITE_OTHER): Payer: Medicare Other | Admitting: Internal Medicine

## 2015-08-24 VITALS — BP 136/82 | HR 68 | Ht 65.0 in | Wt 166.0 lb

## 2015-08-24 DIAGNOSIS — I10 Essential (primary) hypertension: Secondary | ICD-10-CM

## 2015-08-24 DIAGNOSIS — E785 Hyperlipidemia, unspecified: Secondary | ICD-10-CM | POA: Diagnosis not present

## 2015-08-24 DIAGNOSIS — I6529 Occlusion and stenosis of unspecified carotid artery: Secondary | ICD-10-CM | POA: Diagnosis not present

## 2015-08-24 DIAGNOSIS — I5032 Chronic diastolic (congestive) heart failure: Secondary | ICD-10-CM | POA: Diagnosis not present

## 2015-08-24 NOTE — Progress Notes (Signed)
Cardiology Office Note   Date:  08/24/2015   ID:  Virginia [name], DOB May 08, 1936, MRN [id number]  PCP:  Lauree Chandler, NP  Cardiologist:   Dorris Carnes, MD   No chief complaint on file.  F/U of diastolic CHF and HTN     History of Present Illness: Virginia [name] is a 79 y.o. female with a history of Hyperlipdemia, HTN, CV dz (s/p L CEA), chronic diastolic CHF and dementia.  I saw her in clinic in June .  Since seen she says she feels good  Sleeping well  No CP No SOB Appetite is good.   Doing chair yoga and shopping   Current Outpatient Prescriptions  Medication Sig Dispense Refill  . ARTIFICIAL TEAR OP Place 2 drops into both eyes daily.     Marland Kitchen aspirin 81 MG tablet Take 81 mg by mouth daily.    Marland Kitchen atorvastatin (LIPITOR) 20 MG tablet Take 1 tablet (20 mg total) by mouth daily. 90 tablet 3  . b complex vitamins tablet Take 1 tablet by mouth daily.    . Biotin 5000 MCG TABS Take 1 tablet by mouth daily.    . Cholecalciferol (VITAMIN D) 2000 UNITS CAPS Take 1 capsule by mouth daily.    Marland Kitchen co-enzyme Q-10 50 MG capsule Take 100 mg by mouth daily.     Marland Kitchen donepezil (ARICEPT) 10 MG tablet Take 1 tablet (10 mg total) by mouth at bedtime. 30 tablet 5  . furosemide (LASIX) 40 MG tablet Take 1.5 tablets (60 mg total) by mouth every other day. 135 tablet 1  . ipratropium (ATROVENT) 0.03 % nasal spray Place 2 sprays into both nostrils 3 (three) times daily.    . lansoprazole (PREVACID) 15 MG capsule Take 15 mg by mouth daily at 12 noon.    Marland Kitchen losartan (COZAAR) 50 MG tablet Take one tablet by mouth once daily for blood pressure 90 tablet 1  . memantine (NAMENDA) 10 MG tablet Take 1 tablet (10 mg total) by mouth 2 (two) times daily. 60 tablet 5  . Multiple Vitamins-Minerals (ALIVE WOMENS 50+ PO) Take 1 tablet by mouth daily. ([place] 50+)    . Multiple Vitamins-Minerals (PRESERVISION AREDS 2) CAPS Take 1 capsule by mouth daily.    . nebivolol (BYSTOLIC) 5 MG tablet Take 5 mg by mouth daily.     . Phosphatidylserine-DHA-EPA (VAYACOG) 100-19.5-6.5 MG CAPS Take 1 tablet by mouth daily. 30 capsule 5  . saccharomyces boulardii (FLORASTOR) 250 MG capsule Take 250 mg by mouth 2 (two) times a week.     No current facility-administered medications for this visit.    Allergies:   Lidocaine   Past Medical History  Diagnosis Date  . Hyperlipidemia   . Hypertension   . Carotid artery occlusion   . Fall at home Sept. 2013  . Memory disorder 10/24/2014    Past Surgical History  Procedure Laterality Date  . Carotid endarterectomy      left CEA  . Tonsillectomy    . Appendectomy    . Breast reduction surgery    . Skin cancer removal    . Cataract extraction Bilateral   . Colonoscopy  2015  . Eye surgery      cataracts removed, bilaterally      Social History:  The patient  reports that she quit smoking about 20 years ago. Her smoking use included Cigarettes. She quit after 14 years of use. She has never used smokeless tobacco. She reports that  she does not drink alcohol or use illicit drugs.   Family History:  The patient's family history includes Cancer (age of onset: 48) in her mother; Cancer (age of onset: 59) in her maternal aunt; Dementia in her brother; Diabetes in her maternal aunt and maternal grandmother; Heart attack in her father; Heart disease in her father; Heart failure in her maternal grandmother; Hypertension in her father. There is no history of Stroke.    ROS:  Please see the history of present illness. All other systems are reviewed and  Negative to the above problem except as noted.    PHYSICAL EXAM: VS:  BP 136/82 mmHg  Pulse 68  Ht 5\' 5"  (1.651 m)  Wt 166 lb (75.297 kg)  BMI 27.62 kg/m2  GEN: Well nourished, well developed, in no acute distress HEENT: normal Neck: no JVD, carotid bruits, or masses Cardiac: RRR; no murmurs, rubs, or gallops, Tr edema  Respiratory:  clear to auscultation bilaterally, normal work of breathing GI: soft, nontender,  nondistended, + BS  No hepatomegaly  MS: no deformity Moving all extremities   Skin: warm and dry, no rash Neuro:  Strength and sensation are intact Psych: euthymic mood, full affect   EKG:  EKG is not  ordered today.   Lipid Panel    Component Value Date/Time   CHOL 167 08/01/2015 0857   TRIG 119.0 08/01/2015 0857   HDL 61.40 08/01/2015 0857   CHOLHDL 3 08/01/2015 0857   VLDL 23.8 08/01/2015 0857   LDLCALC 81 08/01/2015 0857   LDLDIRECT 142.0 05/24/2015 1108      Wt Readings from Last 3 Encounters:  08/24/15 166 lb (75.297 kg)  08/16/15 176 lb 9.6 oz (80.105 kg)  07/17/15 172 lb 6.4 oz (78.2 kg)      ASSESSMENT AND PLAN:  1.  Chronic dastolic CHF  Volume status looks pretty good  She is breathing comfortably, sleeping well  I would not make any changes  2.  HTN  BP is OK  No changes    3.  HL Keep on lipitor    4.  CV dz  5.  Dementia    Signed, Dorris Carnes, MD  08/24/2015 3:26 PM    [address], [place], [place]  [address] Phone: [phone]; Fax: [phone]

## 2015-08-24 NOTE — Patient Instructions (Signed)
Medication Instructions:   Your physician recommends that you continue on your current medications as directed. Please refer to the Current Medication list given to you today.  Labwork:  NONE ORDER TODAY    Testing/Procedures:   NONE ORDER TODAY    Follow-Up:  Your physician wants you to follow-up in:  IN 6   MONTHS WITH DR  Harrington Challenger  You will receive a reminder letter in the mail two months in advance. If you don't receive a letter, please call our office to schedule the follow-up appointment.      Any Other Special Instructions Will Be Listed Below (If Applicable).

## 2015-08-31 ENCOUNTER — Telehealth: Payer: Self-pay | Admitting: Internal Medicine

## 2015-08-31 NOTE — Telephone Encounter (Signed)
New Message  Pt brother calling, concerning diclofenac gel that pt had spoken w/ Dr Harrington Challenger at last [place]. Pt brother was following up on that new Rx call-in. Please call back and discuss.

## 2015-09-03 DIAGNOSIS — G960 Cerebrospinal fluid leak: Secondary | ICD-10-CM | POA: Diagnosis not present

## 2015-09-05 NOTE — Telephone Encounter (Signed)
Left message for patient's brother, Hughes Better to call back.   Fay Records, MD  Rodman Key, RN           OK She should use only as needed        Previous Messages     ----- Message -----   From: Rodman Key, RN   Sent: 09/04/2015  1:51 PM    To: Fay Records, MD  Subject: FW: diclofenac                  Is it ok to order this?  Rosalva Neary  ----- Message -----   From: Aris Georgia, [facility]   Sent: 08/31/2015  1:23 PM    To: Rodman Key, RN  Subject: RE: diclofenac                  I would recommend diclofenac sodium 75mg  tablet twice daily PRN.   ----- Message -----   From: Rodman Key, RN   Sent: 08/31/2015 11:15 AM    To: Aris Georgia, RPH  Subject: diclofenac                    Hi Sally,  Dr. Harrington Challenger wants to order diclofenac for this patient to use as needed for her arthritic knees.

## 2015-09-05 NOTE — Addendum Note (Signed)
Addended by: Rodman Key on: 09/05/2015 01:24 PM   Modules accepted: Orders

## 2015-09-06 DIAGNOSIS — M25511 Pain in right shoulder: Secondary | ICD-10-CM | POA: Diagnosis not present

## 2015-09-06 DIAGNOSIS — R262 Difficulty in walking, not elsewhere classified: Secondary | ICD-10-CM | POA: Diagnosis not present

## 2015-09-11 ENCOUNTER — Telehealth: Payer: Self-pay | Admitting: Internal Medicine

## 2015-09-11 DIAGNOSIS — M25511 Pain in right shoulder: Secondary | ICD-10-CM | POA: Diagnosis not present

## 2015-09-11 DIAGNOSIS — R262 Difficulty in walking, not elsewhere classified: Secondary | ICD-10-CM | POA: Diagnosis not present

## 2015-09-11 DIAGNOSIS — Z23 Encounter for immunization: Secondary | ICD-10-CM | POA: Diagnosis not present

## 2015-09-11 MED ORDER — DICLOFENAC SODIUM 75 MG PO TBEC: 75.0000 mg | DELAYED_RELEASE_TABLET | Freq: Two times a day (BID) | ORAL | Status: DC | PRN

## 2015-09-11 NOTE — Telephone Encounter (Signed)
New message      Returning a call to [place] from days ago

## 2015-09-11 NOTE — Telephone Encounter (Signed)
Informed the pts brother Hughes Better, that per Dr Harrington Challenger and Michalene's conversation on 08/31/15, its ok for the pt to take diclofenac sodium 75 mg po bid only as needed for arthritic pain.  Confirmed the pharmacy of choice with the pts Brother.  Brother [name] verbalized understanding and agrees with this plan.  Brother gracious for all the assistance provided. Will route this message to Dr Alan Ripper nurse Caren Hazy, to make her aware of this conversation.

## 2015-09-19 DIAGNOSIS — Z87891 Personal history of nicotine dependence: Secondary | ICD-10-CM | POA: Diagnosis not present

## 2015-09-19 DIAGNOSIS — J3489 Other specified disorders of nose and nasal sinuses: Secondary | ICD-10-CM | POA: Diagnosis not present

## 2015-09-19 DIAGNOSIS — I509 Heart failure, unspecified: Secondary | ICD-10-CM | POA: Diagnosis not present

## 2015-09-19 DIAGNOSIS — I10 Essential (primary) hypertension: Secondary | ICD-10-CM | POA: Diagnosis not present

## 2015-09-19 DIAGNOSIS — Z79899 Other long term (current) drug therapy: Secondary | ICD-10-CM | POA: Diagnosis not present

## 2015-09-19 DIAGNOSIS — R0602 Shortness of breath: Secondary | ICD-10-CM | POA: Diagnosis not present

## 2015-09-19 DIAGNOSIS — R0982 Postnasal drip: Secondary | ICD-10-CM | POA: Diagnosis not present

## 2015-09-19 DIAGNOSIS — E785 Hyperlipidemia, unspecified: Secondary | ICD-10-CM | POA: Diagnosis not present

## 2015-09-19 DIAGNOSIS — J310 Chronic rhinitis: Secondary | ICD-10-CM | POA: Diagnosis not present

## 2015-09-25 ENCOUNTER — Telehealth: Payer: Self-pay

## 2015-09-25 DIAGNOSIS — M25511 Pain in right shoulder: Secondary | ICD-10-CM | POA: Diagnosis not present

## 2015-09-25 DIAGNOSIS — R262 Difficulty in walking, not elsewhere classified: Secondary | ICD-10-CM | POA: Diagnosis not present

## 2015-09-25 NOTE — Telephone Encounter (Signed)
Called left message for patients brother to come to office and pick up paper work for handicap sticker.

## 2015-10-02 DIAGNOSIS — R262 Difficulty in walking, not elsewhere classified: Secondary | ICD-10-CM | POA: Diagnosis not present

## 2015-10-02 DIAGNOSIS — M25511 Pain in right shoulder: Secondary | ICD-10-CM | POA: Diagnosis not present

## 2015-10-09 DIAGNOSIS — R262 Difficulty in walking, not elsewhere classified: Secondary | ICD-10-CM | POA: Diagnosis not present

## 2015-10-09 DIAGNOSIS — M25511 Pain in right shoulder: Secondary | ICD-10-CM | POA: Diagnosis not present

## 2015-10-12 ENCOUNTER — Other Ambulatory Visit: Payer: Self-pay

## 2015-10-12 MED ORDER — FUROSEMIDE 20 MG PO TABS: ORAL_TABLET | ORAL | Status: DC

## 2015-10-12 MED ORDER — FUROSEMIDE 40 MG PO TABS: ORAL_TABLET | ORAL | Status: DC

## 2015-10-12 NOTE — Telephone Encounter (Deleted)
Patient's daughter called asking for a Rx of 20 mg of Lasix to go with the 40mg   that she is taking to equal the 60 mg daily as prescribed. She has been cutting a 40 mg in half to put with another 40 mg to make the 60 mg. But the pills are crumbling and the daughter is concerned that her mother is not getting the proper dosage.  Please advise.

## 2015-10-23 DIAGNOSIS — M25511 Pain in right shoulder: Secondary | ICD-10-CM | POA: Diagnosis not present

## 2015-10-23 DIAGNOSIS — R262 Difficulty in walking, not elsewhere classified: Secondary | ICD-10-CM | POA: Diagnosis not present

## 2015-11-02 ENCOUNTER — Encounter: Payer: Self-pay | Admitting: Family

## 2015-11-06 ENCOUNTER — Encounter (HOSPITAL_COMMUNITY): Payer: Medicare Other

## 2015-11-06 ENCOUNTER — Ambulatory Visit: Payer: Medicare Other | Admitting: Family

## 2015-11-07 DIAGNOSIS — H52223 Regular astigmatism, bilateral: Secondary | ICD-10-CM | POA: Diagnosis not present

## 2015-11-07 DIAGNOSIS — Z961 Presence of intraocular lens: Secondary | ICD-10-CM | POA: Diagnosis not present

## 2015-11-07 DIAGNOSIS — H5203 Hypermetropia, bilateral: Secondary | ICD-10-CM | POA: Diagnosis not present

## 2015-11-08 DIAGNOSIS — Z79899 Other long term (current) drug therapy: Secondary | ICD-10-CM | POA: Diagnosis not present

## 2015-11-08 DIAGNOSIS — I10 Essential (primary) hypertension: Secondary | ICD-10-CM | POA: Diagnosis not present

## 2015-11-08 DIAGNOSIS — E785 Hyperlipidemia, unspecified: Secondary | ICD-10-CM | POA: Diagnosis not present

## 2015-11-08 DIAGNOSIS — J310 Chronic rhinitis: Secondary | ICD-10-CM | POA: Diagnosis not present

## 2015-11-08 DIAGNOSIS — Z87891 Personal history of nicotine dependence: Secondary | ICD-10-CM | POA: Diagnosis not present

## 2015-11-08 DIAGNOSIS — R0602 Shortness of breath: Secondary | ICD-10-CM | POA: Diagnosis not present

## 2015-11-08 DIAGNOSIS — R0982 Postnasal drip: Secondary | ICD-10-CM | POA: Diagnosis not present

## 2015-11-08 DIAGNOSIS — J3489 Other specified disorders of nose and nasal sinuses: Secondary | ICD-10-CM | POA: Diagnosis not present

## 2015-11-08 DIAGNOSIS — Z888 Allergy status to other drugs, medicaments and biological substances status: Secondary | ICD-10-CM | POA: Diagnosis not present

## 2015-11-09 ENCOUNTER — Encounter: Payer: Self-pay | Admitting: Family

## 2015-11-12 ENCOUNTER — Encounter: Payer: Self-pay | Admitting: Nurse Practitioner

## 2015-11-12 ENCOUNTER — Ambulatory Visit: Payer: Medicare Other | Admitting: Nurse Practitioner

## 2015-11-12 ENCOUNTER — Ambulatory Visit (INDEPENDENT_AMBULATORY_CARE_PROVIDER_SITE_OTHER): Payer: Medicare Other | Admitting: Nurse Practitioner

## 2015-11-12 VITALS — BP 151/71 | HR 65 | Ht 65.0 in | Wt 180.2 lb

## 2015-11-12 DIAGNOSIS — I6529 Occlusion and stenosis of unspecified carotid artery: Secondary | ICD-10-CM

## 2015-11-12 DIAGNOSIS — R413 Other amnesia: Secondary | ICD-10-CM

## 2015-11-12 MED ORDER — DONEPEZIL HCL 10 MG PO TABS: 10.0000 mg | ORAL_TABLET | Freq: Every day | ORAL | Status: DC

## 2015-11-12 MED ORDER — MEMANTINE HCL 10 MG PO TABS: 10.0000 mg | ORAL_TABLET | Freq: Two times a day (BID) | ORAL | Status: DC

## 2015-11-12 NOTE — Patient Instructions (Signed)
Continue Namenda and Aricept at current  Doses will refill Memory score is stable F/U in 6 months

## 2015-11-12 NOTE — Progress Notes (Signed)
[facility]  PATIENT: Virginia [name] DOB: [birth year]   REASON FOR VISIT: Follow-up for memory loss HISTORY FROM: Patient and brother  HISTORY OF PRESENT ILLNESS:[name] is a 79 year old right-handed white female with a history of a progressive memory disorder. The patient has been placed on Aricept, and is also on Namenda. She is tolerating it fairly well, without any stomach upset or other issues. The patient had some insomnia on Aricept initially. This is no longer the case. Currently, her brother is living with her, and her sister comes down once a month from [place], [place] to check on her. The sister does the finances, the brother keeps up with the medications and appointments. The patient does not operate a motor vehicle. She does yoga every day. They returned to this office for an evaluation.   REVIEW OF SYSTEMS: Full 14 system review of systems performed and notable only for those listed, all others are neg:  Constitutional: neg  Cardiovascular: neg Ear/Nose/Throat: neg  Skin: neg Eyes: neg Respiratory: Shortness of breath Gastroitestinal: neg  Hematology/Lymphatic: neg  Endocrine: neg Musculoskeletal:neg Allergy/Immunology: neg Neurological: neg Psychiatric: neg Sleep : neg   ALLERGIES: Allergies  Allergen Reactions  . Lidocaine Anaphylaxis, Swelling, Rash and Other (See Comments)    Any of the " [facility] "    HOME MEDICATIONS: Outpatient Prescriptions Prior to Visit  Medication Sig Dispense Refill  . ARTIFICIAL TEAR OP Place 2 drops into both eyes daily.     Marland Kitchen aspirin 81 MG tablet Take 81 mg by mouth daily.    Marland Kitchen atorvastatin (LIPITOR) 20 MG tablet Take 1 tablet (20 mg total) by mouth daily. 90 tablet 3  . b complex vitamins tablet Take 1 tablet by mouth daily.    . Biotin 5000 MCG TABS Take 1 tablet by mouth daily.    . Cholecalciferol (VITAMIN D) 2000 UNITS CAPS Take 1 capsule by mouth daily.    Marland Kitchen co-enzyme Q-10 50 MG capsule Take  100 mg by mouth daily.     . diclofenac (VOLTAREN) 75 MG EC tablet Take 1 tablet (75 mg total) by mouth 2 (two) times daily as needed (arthritic pain). 180 tablet 3  . donepezil (ARICEPT) 10 MG tablet Take 1 tablet (10 mg total) by mouth at bedtime. 30 tablet 5  . furosemide (LASIX) 20 MG tablet Take 1 tablet (20 mg total) and 1 tablet (40 mg total) together  to equal 60 mg total by mouth  every other day. 90 tablet 3  . furosemide (LASIX) 40 MG tablet Take 1 tablet (40 mg total) and 1 tablet (20 mg total) together to equal 60 mg total every other day. 90 tablet 2  . ipratropium (ATROVENT) 0.03 % nasal spray Place 2 sprays into both nostrils 3 (three) times daily.    . lansoprazole (PREVACID) 15 MG capsule Take 15 mg by mouth daily at 12 noon.    Marland Kitchen losartan (COZAAR) 50 MG tablet Take one tablet by mouth once daily for blood pressure 90 tablet 1  . memantine (NAMENDA) 10 MG tablet Take 1 tablet (10 mg total) by mouth 2 (two) times daily. 60 tablet 5  . Multiple Vitamins-Minerals (ALIVE WOMENS 50+ PO) Take 1 tablet by mouth daily. ([place] 50+)    . Multiple Vitamins-Minerals (PRESERVISION AREDS 2) CAPS Take 1 capsule by mouth daily.    . nebivolol (BYSTOLIC) 5 MG tablet Take 5 mg by mouth daily.    . Phosphatidylserine-DHA-EPA (VAYACOG) 100-19.5-6.5 MG CAPS  Take 1 tablet by mouth daily. 30 capsule 5  . saccharomyces boulardii (FLORASTOR) 250 MG capsule Take 250 mg by mouth 2 (two) times a week.     No facility-administered medications prior to visit.    PAST MEDICAL HISTORY: Past Medical History  Diagnosis Date  . Hyperlipidemia   . Hypertension   . Carotid artery occlusion   . Fall at home Sept. 2013  . Memory disorder 10/24/2014    PAST SURGICAL HISTORY: Past Surgical History  Procedure Laterality Date  . Carotid endarterectomy      left CEA  . Tonsillectomy    . Appendectomy    . Breast reduction surgery    . Skin cancer removal    . Cataract extraction Bilateral   .  Colonoscopy  2015  . Eye surgery      cataracts removed, bilaterally     FAMILY HISTORY: Family History  Problem Relation Age of Onset  . Heart disease Father     Before age 67  . Hypertension Father   . Heart attack Father   . Cancer Mother 69    Brain  . Dementia Brother   . Heart failure Maternal Grandmother   . Diabetes Maternal Grandmother   . Stroke Neg Hx   . Cancer Maternal Aunt 61    breast cancer  . Diabetes Maternal Aunt     SOCIAL HISTORY: Social History   Social History  . Marital Status: Divorced    Spouse Name: N/A  . Number of Children: 2  . Years of Education: 7   Occupational History  . retired    Social History Main Topics  . Smoking status: Former Smoker -- 14 years    Types: Cigarettes    Quit date: 11/30/1994  . Smokeless tobacco: Never Used     Comment: very light smoker, not even every day   . Alcohol Use: No  . Drug Use: No  . Sexual Activity: No   Other Topics Concern  . Not on file   Social History Narrative   Diet:       Do you drink/eat things with caffeine: yes      Marital Status: Divorced   What Year Married:1959      Do you live in a house, apartment, Assisted Living, Condo, trailer? House      Is it one or more stories? 2 stories      How many persons live in your home? Just Patient      Do you have any pets in your home? 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 Very Little   Type and how often? Walk      Do you have a living will? None   DNR?   Discuss one? No      Do you have signed POA/HPOA forms?      Patient drinks 1 cup of caffeine daily.   Patient is right handed.     PHYSICAL EXAM  Filed Vitals:   11/12/15 1545  BP: 151/71  Pulse: 65  Height: 5\' 5"  (1.651 m)  Weight: 180 lb 3.2 oz (81.738 kg)   Body mass index is 29.99 kg/(m^2). General: The patient is alert and cooperative at the time of the examination. Skin: No significant peripheral edema is  noted.   Neurologic Exam Mental status: The patient is alert and oriented x 2 at the time of the examination (the patient is  not oriented to year.   Mini-Mental Status Examination done today shows a total score 25/30. The patient is able to name 8 animals in 30 seconds. Cranial nerves: Facial symmetry is present. Speech is normal, no aphasia or dysarthria is noted. Extraocular movements are full. Visual fields are full. Pupils are anisocoric, the left pupil is 5-6 mm, right pupil is 3 mm. Motor: The patient has good strength in all 4 extremities. Sensory examination: Soft touch sensation is symmetric on the face, arms, and legs. Coordination: The patient has good finger-nose-finger and heel-to-shin bilaterally. Gait and station: The patient has a normal gait. Tandem gait is unsteady. Romberg is negative. No drift is seen. Reflexes: Deep tendon reflexes are symmetric.     DIAGNOSTIC DATA (LABS, IMAGING, TESTING) - I reviewed patient records, labs, notes, testing and imaging myself where available.  Lab Results  Component Value Date   WBC 8.3 08/16/2015   HGB 14.3 04/12/2015   HCT 40.9 08/16/2015   MCV 86.2 04/12/2015   PLT 276.0 04/12/2015      Component Value Date/Time   NA 142 08/16/2015 1552   NA 138 04/17/2015 1127   K 4.9 08/16/2015 1552   CL 99 08/16/2015 1552   CO2 27 08/16/2015 1552   GLUCOSE 75 08/16/2015 1552   GLUCOSE 83 04/17/2015 1127   BUN 19 08/16/2015 1552   BUN 25* 04/17/2015 1127   CREATININE 1.05* 08/16/2015 1552   CALCIUM 9.1 08/16/2015 1552   PROT 6.4 12/29/2014 1242   ALBUMIN 3.4* 12/29/2014 1242   AST 28 08/01/2015 0857   ALT 17 12/29/2014 1242   ALKPHOS 68 12/29/2014 1242   BILITOT 0.9 12/29/2014 1242   GFRNONAA 51* 08/16/2015 1552   GFRAA 59* 08/16/2015 1552   Lab Results  Component Value Date   CHOL 167 08/01/2015   HDL 61.40 08/01/2015   LDLCALC 81 08/01/2015   LDLDIRECT 142.0 05/24/2015   TRIG 119.0 08/01/2015   CHOLHDL 3 08/01/2015    Lab Results  Component Value Date   HGBA1C 5.6 04/12/2015    Lab Results  Component Value Date   TSH 1.00 12/29/2014      ASSESSMENT AND PLAN  79 y.o. year old female  has a past medical history of memory disorder here to follow-up. Continue Namenda and Aricept at current doses will refill Memory score is stable Follow-up in 6 months Dennie Bible, [facility], [facility], APRN  [facility] [address] [place], [place] [address] [phone]

## 2015-11-12 NOTE — Progress Notes (Signed)
I have read the note, and I agree with the clinical assessment and plan.  WILLIS,CHARLES KEITH   

## 2015-11-13 ENCOUNTER — Other Ambulatory Visit: Payer: Self-pay | Admitting: Family

## 2015-11-13 DIAGNOSIS — I6523 Occlusion and stenosis of bilateral carotid arteries: Secondary | ICD-10-CM

## 2015-11-13 DIAGNOSIS — Z48812 Encounter for surgical aftercare following surgery on the circulatory system: Secondary | ICD-10-CM

## 2015-11-15 ENCOUNTER — Ambulatory Visit (HOSPITAL_COMMUNITY)
Admission: RE | Admit: 2015-11-15 | Discharge: 2015-11-15 | Disposition: A | Discharge status: Home / Self Care | Payer: Medicare Other | Source: Ambulatory Visit | Attending: Family | Admitting: Family

## 2015-11-15 ENCOUNTER — Ambulatory Visit (INDEPENDENT_AMBULATORY_CARE_PROVIDER_SITE_OTHER): Payer: Medicare Other | Admitting: Family

## 2015-11-15 ENCOUNTER — Encounter: Payer: Self-pay | Admitting: Family

## 2015-11-15 VITALS — BP 139/78 | HR 65 | Resp 18 | Ht 65.0 in | Wt 180.0 lb

## 2015-11-15 DIAGNOSIS — I6523 Occlusion and stenosis of bilateral carotid arteries: Secondary | ICD-10-CM | POA: Diagnosis not present

## 2015-11-15 DIAGNOSIS — Z9889 Other specified postprocedural states: Secondary | ICD-10-CM | POA: Diagnosis not present

## 2015-11-15 DIAGNOSIS — R0989 Other specified symptoms and signs involving the circulatory and respiratory systems: Secondary | ICD-10-CM

## 2015-11-15 DIAGNOSIS — Z48812 Encounter for surgical aftercare following surgery on the circulatory system: Secondary | ICD-10-CM | POA: Insufficient documentation

## 2015-11-15 NOTE — Patient Instructions (Signed)
Stroke Prevention Some medical conditions and behaviors are associated with an increased chance of having a stroke. You may prevent a stroke by making healthy choices and managing medical conditions. HOW CAN I REDUCE MY RISK OF HAVING A STROKE?   Stay physically active. Get at least 30 minutes of activity on most or all days.  Do not smoke. It may also be helpful to avoid exposure to secondhand smoke.  Limit alcohol use. Moderate alcohol use is considered to be:  No more than 2 drinks per day for men.  No more than 1 drink per day for nonpregnant women.  Eat healthy foods. This involves:  Eating 5 or more servings of fruits and vegetables a day.  Making dietary changes that address high blood pressure (hypertension), high cholesterol, diabetes, or obesity.  Manage your cholesterol levels.  Making food choices that are high in fiber and low in saturated fat, trans fat, and cholesterol may control cholesterol levels.  Take any prescribed medicines to control cholesterol as directed by your health care provider.  Manage your diabetes.  Controlling your carbohydrate and sugar intake is recommended to manage diabetes.  Take any prescribed medicines to control diabetes as directed by your health care provider.  Control your hypertension.  Making food choices that are low in salt (sodium), saturated fat, trans fat, and cholesterol is recommended to manage hypertension.  Ask your health care provider if you need treatment to lower your blood pressure. Take any prescribed medicines to control hypertension as directed by your health care provider.  If you are 18-39 years of age, have your blood pressure checked every 3-5 years. If you are 40 years of age or older, have your blood pressure checked every year.  Maintain a healthy weight.  Reducing calorie intake and making food choices that are low in sodium, saturated fat, trans fat, and cholesterol are recommended to manage  weight.  Stop drug abuse.  Avoid taking birth control pills.  Talk to your health care provider about the risks of taking birth control pills if you are over 35 years old, smoke, get migraines, or have ever had a blood clot.  Get evaluated for sleep disorders (sleep apnea).  Talk to your health care provider about getting a sleep evaluation if you snore a lot or have excessive sleepiness.  Take medicines only as directed by your health care provider.  For some people, aspirin or blood thinners (anticoagulants) are helpful in reducing the risk of forming abnormal blood clots that can lead to stroke. If you have the irregular heart rhythm of atrial fibrillation, you should be on a blood thinner unless there is a good reason you cannot take them.  Understand all your medicine instructions.  Make sure that other conditions (such as anemia or atherosclerosis) are addressed. SEEK IMMEDIATE MEDICAL CARE IF:   You have sudden weakness or numbness of the face, arm, or leg, especially on one side of the body.  Your face or eyelid droops to one side.  You have sudden confusion.  You have trouble speaking (aphasia) or understanding.  You have sudden trouble seeing in one or both eyes.  You have sudden trouble walking.  You have dizziness.  You have a loss of balance or coordination.  You have a sudden, severe headache with no known cause.  You have new chest pain or an irregular heartbeat. Any of these symptoms may represent a serious problem that is an emergency. Do not wait to see if the symptoms will   go away. Get medical help at once. Call your local emergency services (911 in [place] Do not drive yourself to the hospital.   This information is not intended to replace advice given to you by your health care provider. Make sure you discuss any questions you have with your health care provider.   Document Released: 12/25/2004 Document Revised: 12/08/2014 Document Reviewed:  05/20/2013 Elsevier Interactive Patient Education 2016 Elsevier Inc.  

## 2015-11-15 NOTE — Progress Notes (Signed)
Chief Complaint: Extracranial Carotid Artery Stenosis   History of Present Illness  Virginia [name] is a 79 y.o. female patient of Dr. Kellie Simmering who is status post left CEA in 2010. She returns today for follow up accompanied by her brother.  The patient denies any history of TIA or stroke symptoms, specifically the patient denies a history of amaurosis fugax or monocular blindness, denies a history unilateral of facial drooping, denies a history of hemiplegia, and denies a history of receptive or expressive aphasia.  The pt denies any dizziness.  The pt sees Dr. Jannifer Franklin for memory loss; his notes include the result of a CT of the head: CT head 09/18/14: IMPRESSION: Small vessel disease type changes without CT evidence of large acute infarct.  She denies tingling, numbness, pain, or cold sensation in either hand/arm. The patient reports New Medical or Surgical History: hospitalized for pneumonia in December and January of 2015/2016.  Pt Diabetic: No Pt smoker: former smoker, quit in 2005  Pt meds include: Statin : yes ASA: Yes Other anticoagulants/antiplatelets: no   Past Medical History  Diagnosis Date  . Hyperlipidemia   . Hypertension   . Carotid artery occlusion   . Fall at home Sept. 2013  . Memory disorder 10/24/2014    Social History Social History  Substance Use Topics  . Smoking status: Former Smoker -- 14 years    Types: Cigarettes    Quit date: 11/30/1994  . Smokeless tobacco: Never Used     Comment: very light smoker, not even every day   . Alcohol Use: No    Family History Family History  Problem Relation Age of Onset  . Heart disease Father     Before age 4  . Hypertension Father   . Heart attack Father   . Cancer Mother 30    Brain  . Dementia Brother   . Heart failure Maternal Grandmother   . Diabetes Maternal Grandmother   . Stroke Neg Hx   . Cancer Maternal Aunt 61    breast cancer  . Diabetes Maternal Aunt     Surgical  History Past Surgical History  Procedure Laterality Date  . Carotid endarterectomy      left CEA  . Tonsillectomy    . Appendectomy    . Breast reduction surgery    . Skin cancer removal    . Cataract extraction Bilateral   . Colonoscopy  2015  . Eye surgery      cataracts removed, bilaterally     Allergies  Allergen Reactions  . Lidocaine Anaphylaxis, Swelling, Rash and Other (See Comments)    Any of the " [facility] "    Current Outpatient Prescriptions  Medication Sig Dispense Refill  . ARTIFICIAL TEAR OP Place 2 drops into both eyes daily.     Marland Kitchen aspirin 81 MG tablet Take 81 mg by mouth daily.    Marland Kitchen atorvastatin (LIPITOR) 20 MG tablet Take 1 tablet (20 mg total) by mouth daily. 90 tablet 3  . b complex vitamins tablet Take 1 tablet by mouth daily.    . Biotin 5000 MCG TABS Take 1 tablet by mouth daily.    . Cholecalciferol (VITAMIN D) 2000 UNITS CAPS Take 1 capsule by mouth daily.    Marland Kitchen co-enzyme Q-10 50 MG capsule Take 100 mg by mouth daily.     . diclofenac (VOLTAREN) 75 MG EC tablet Take 1 tablet (75 mg total) by mouth 2 (two) times daily as needed (arthritic pain).  180 tablet 3  . donepezil (ARICEPT) 10 MG tablet Take 1 tablet (10 mg total) by mouth at bedtime. 30 tablet 6  . furosemide (LASIX) 20 MG tablet Take 1 tablet (20 mg total) and 1 tablet (40 mg total) together  to equal 60 mg total by mouth  every other day. 90 tablet 3  . furosemide (LASIX) 40 MG tablet Take 1 tablet (40 mg total) and 1 tablet (20 mg total) together to equal 60 mg total every other day. 90 tablet 2  . ipratropium (ATROVENT) 0.03 % nasal spray Place 2 sprays into both nostrils 3 (three) times daily.    . lansoprazole (PREVACID) 15 MG capsule Take 15 mg by mouth daily at 12 noon.    Marland Kitchen losartan (COZAAR) 50 MG tablet Take one tablet by mouth once daily for blood pressure 90 tablet 1  . memantine (NAMENDA) 10 MG tablet Take 1 tablet (10 mg total) by mouth 2 (two) times daily. 60 tablet 6  .  Multiple Vitamins-Minerals (ALIVE WOMENS 50+ PO) Take 1 tablet by mouth daily. ([place] 50+)    . Multiple Vitamins-Minerals (PRESERVISION AREDS 2) CAPS Take 1 capsule by mouth daily.    . nebivolol (BYSTOLIC) 5 MG tablet Take 5 mg by mouth daily.    . Phosphatidylserine-DHA-EPA (VAYACOG) 100-19.5-6.5 MG CAPS Take 1 tablet by mouth daily. 30 capsule 5  . saccharomyces boulardii (FLORASTOR) 250 MG capsule Take 250 mg by mouth 2 (two) times a week.     No current facility-administered medications for this visit.    Review of Systems : See HPI for pertinent positives and negatives.  Physical Examination  Filed Vitals:   11/15/15 1605 11/15/15 1606  BP: 171/80 139/78  Pulse: 65   Resp: 18   Height: 5\' 5"  (1.651 m)   Weight: 180 lb (81.647 kg)   SpO2: 96%    Body mass index is 29.95 kg/(m^2).   General: WDWN female in NAD GAIT: normal Eyes: PERRLA Pulmonary: Non-labored, CTAB, no rales,  rhonchi, or wheezing.  Cardiac: regular rhythm, no detected murmur.  VASCULAR EXAM: Carotid Bruits Right Left   Negative Negative   Aorta is not palpable. Radial pulses: right is Dopplerable (not palpable), left is 2+ palpable.      LE Pulses Right Left   POPLITEAL not palpable  not palpable   POSTERIOR TIBIAL 2+ palpable  2+ palpable    DORSALIS PEDIS  ANTERIOR TIBIAL 1+ palpable  2+ palpable     Gastrointestinal: soft, nontender, BS WNL, no r/g, no palpable masses.  Musculoskeletal: No muscle atrophy/wasting. M/S 5/5 throughout, Extremities without ischemic changes.  Neurologic: A&O X 2; Appropriate Affect, Speech is normal CN 2-12 intact, Pain and light touch intact in extremities, Motor exam as listed above.         Non-Invasive Vascular Imaging CAROTID DUPLEX 11/15/2015   Right ICA: <40%  stenosis. Left ICA: <40% stenosis.CEA site. Right vertebral artery is antegrade; left is not well visualized and may be functionally occluded.  No significant change compared to prior exam.    Assessment: [name] is a 79 y.o. female who is status post left CEA in 2010. She has no hx of stroke or TIA. Today's carotid duplex suggests minimal bilateral ICA stenosis. No significant change compared to prior exam a year ago.   Plan: Follow-up in 1 year with Carotid Duplex scan to include subclavian arteries.   I discussed in depth with the patient the nature of atherosclerosis, and emphasized the  importance of maximal medical management including strict control of blood pressure, blood glucose, and lipid levels, obtaining regular exercise, and continued cessation of smoking.  The patient is aware that without maximal medical management the underlying atherosclerotic disease process will progress, limiting the benefit of any interventions. The patient was given information about stroke prevention and what symptoms should prompt the patient to seek immediate medical care. Thank you for allowing [place] to participate in this patient's care.  Clemon Chambers, RN, MSN, FNP-C [facility] Office: [phone]  Clinic Physician: Oneida Alar  11/15/2015 3:31 PM

## 2015-11-26 ENCOUNTER — Other Ambulatory Visit: Payer: Self-pay | Admitting: Internal Medicine

## 2015-11-26 ENCOUNTER — Other Ambulatory Visit: Payer: Self-pay | Admitting: Neurology

## 2015-11-27 ENCOUNTER — Other Ambulatory Visit: Payer: Self-pay | Admitting: *Deleted

## 2015-11-27 MED ORDER — LOSARTAN POTASSIUM 50 MG PO TABS: ORAL_TABLET | ORAL | Status: DC

## 2015-11-27 NOTE — Telephone Encounter (Signed)
[facility] 

## 2015-12-17 DIAGNOSIS — H43813 Vitreous degeneration, bilateral: Secondary | ICD-10-CM | POA: Diagnosis not present

## 2015-12-17 DIAGNOSIS — H04123 Dry eye syndrome of bilateral lacrimal glands: Secondary | ICD-10-CM | POA: Diagnosis not present

## 2015-12-17 DIAGNOSIS — H353131 Nonexudative age-related macular degeneration, bilateral, early dry stage: Secondary | ICD-10-CM | POA: Diagnosis not present

## 2015-12-17 DIAGNOSIS — Z961 Presence of intraocular lens: Secondary | ICD-10-CM | POA: Diagnosis not present

## 2015-12-18 DIAGNOSIS — R262 Difficulty in walking, not elsewhere classified: Secondary | ICD-10-CM | POA: Diagnosis not present

## 2015-12-18 DIAGNOSIS — M25511 Pain in right shoulder: Secondary | ICD-10-CM | POA: Diagnosis not present

## 2015-12-24 DIAGNOSIS — R262 Difficulty in walking, not elsewhere classified: Secondary | ICD-10-CM | POA: Diagnosis not present

## 2015-12-24 DIAGNOSIS — M25511 Pain in right shoulder: Secondary | ICD-10-CM | POA: Diagnosis not present

## 2015-12-25 DIAGNOSIS — R0602 Shortness of breath: Secondary | ICD-10-CM | POA: Diagnosis not present

## 2015-12-25 DIAGNOSIS — J3489 Other specified disorders of nose and nasal sinuses: Secondary | ICD-10-CM | POA: Diagnosis not present

## 2015-12-25 DIAGNOSIS — E785 Hyperlipidemia, unspecified: Secondary | ICD-10-CM | POA: Diagnosis not present

## 2015-12-25 DIAGNOSIS — Z87891 Personal history of nicotine dependence: Secondary | ICD-10-CM | POA: Diagnosis not present

## 2015-12-25 DIAGNOSIS — Z888 Allergy status to other drugs, medicaments and biological substances status: Secondary | ICD-10-CM | POA: Diagnosis not present

## 2015-12-25 DIAGNOSIS — I509 Heart failure, unspecified: Secondary | ICD-10-CM | POA: Diagnosis not present

## 2015-12-25 DIAGNOSIS — I110 Hypertensive heart disease with heart failure: Secondary | ICD-10-CM | POA: Diagnosis not present

## 2015-12-25 DIAGNOSIS — R0982 Postnasal drip: Secondary | ICD-10-CM | POA: Diagnosis not present

## 2015-12-25 DIAGNOSIS — Z79899 Other long term (current) drug therapy: Secondary | ICD-10-CM | POA: Diagnosis not present

## 2015-12-25 DIAGNOSIS — J310 Chronic rhinitis: Secondary | ICD-10-CM | POA: Diagnosis not present

## 2016-01-17 ENCOUNTER — Encounter: Payer: Self-pay | Admitting: Nurse Practitioner

## 2016-01-17 ENCOUNTER — Ambulatory Visit (INDEPENDENT_AMBULATORY_CARE_PROVIDER_SITE_OTHER): Payer: Medicare Other | Admitting: Nurse Practitioner

## 2016-01-17 VITALS — BP 130/82 | HR 74 | Temp 98.0°F | Resp 20 | Ht 65.0 in | Wt 183.0 lb

## 2016-01-17 DIAGNOSIS — M170 Bilateral primary osteoarthritis of knee: Secondary | ICD-10-CM

## 2016-01-17 DIAGNOSIS — E559 Vitamin D deficiency, unspecified: Secondary | ICD-10-CM | POA: Diagnosis not present

## 2016-01-17 DIAGNOSIS — K219 Gastro-esophageal reflux disease without esophagitis: Secondary | ICD-10-CM

## 2016-01-17 DIAGNOSIS — I10 Essential (primary) hypertension: Secondary | ICD-10-CM | POA: Diagnosis not present

## 2016-01-17 DIAGNOSIS — R413 Other amnesia: Secondary | ICD-10-CM | POA: Diagnosis not present

## 2016-01-17 DIAGNOSIS — I5032 Chronic diastolic (congestive) heart failure: Secondary | ICD-10-CM | POA: Diagnosis not present

## 2016-01-17 DIAGNOSIS — M1990 Unspecified osteoarthritis, unspecified site: Secondary | ICD-10-CM | POA: Insufficient documentation

## 2016-01-17 MED ORDER — DICLOFENAC SODIUM 1 % TD GEL: 2.0000 g | Freq: Four times a day (QID) | TRANSDERMAL | Status: DC | PRN

## 2016-01-17 MED ORDER — PANTOPRAZOLE SODIUM 40 MG PO TBEC: 40.0000 mg | DELAYED_RELEASE_TABLET | Freq: Every day | ORAL | Status: DC

## 2016-01-17 NOTE — Patient Instructions (Addendum)
Will stop Prevacid and start Protonix 40 mg daily for acid reflux Diet modifications encouraged.   Voltaren gel to knees   Will follow up Vit D level today   Food Choices for Gastroesophageal Reflux Disease, Adult When you have gastroesophageal reflux disease (GERD), the foods you eat and your eating habits are very important. Choosing the right foods can help ease the discomfort of GERD. WHAT GENERAL GUIDELINES DO I NEED TO FOLLOW?  Choose fruits, vegetables, whole grains, low-fat dairy products, and low-fat meat, fish, and poultry.  Limit fats such as oils, salad dressings, butter, nuts, and avocado.  Keep a food diary to identify foods that cause symptoms.  Avoid foods that cause reflux. These may be different for different people.  Eat frequent small meals instead of three large meals each day.  Eat your meals slowly, in a relaxed setting.  Limit fried foods.  Cook foods using methods other than frying.  Avoid drinking alcohol.  Avoid drinking large amounts of liquids with your meals.  Avoid bending over or lying down until 2-3 hours after eating. WHAT FOODS ARE NOT RECOMMENDED? The following are some foods and drinks that may worsen your symptoms: Vegetables Tomatoes. Tomato juice. Tomato and spaghetti sauce. Chili peppers. Onion and garlic. Horseradish. Fruits Oranges, grapefruit, and lemon (fruit and juice). Meats High-fat meats, fish, and poultry. This includes hot dogs, ribs, ham, sausage, salami, and bacon. Dairy Whole milk and chocolate milk. Sour cream. Cream. Butter. Ice cream. Cream cheese.  Beverages Coffee and tea, with or without caffeine. Carbonated beverages or energy drinks. Condiments Hot sauce. Barbecue sauce.  Sweets/Desserts Chocolate and cocoa. Donuts. Peppermint and spearmint. Fats and Oils High-fat foods, including [place] fries and potato chips. Other Vinegar. Strong spices, such as black pepper, white pepper, red pepper, cayenne, curry  powder, cloves, ginger, and chili powder. The items listed above may not be a complete list of foods and beverages to avoid. Contact your dietitian for more information.   This information is not intended to replace advice given to you by your health care provider. Make sure you discuss any questions you have with your health care provider.   Document Released: 11/17/2005 Document Revised: 12/08/2014 Document Reviewed: 09/21/2013 Elsevier Interactive Patient Education [employer].

## 2016-01-17 NOTE — Progress Notes (Signed)
Patient ID: Virginia [name], female   DOB: Jul 27, 1936, 80 y.o.   MRN: [id number]    PCP: Lauree Chandler, NP  Advanced Directive information Does patient have an advance directive?: Yes, Type of Advance Directive: Healthcare Power of Attorney  Allergies  Allergen Reactions  . Lidocaine Anaphylaxis, Swelling, Rash and Other (See Comments)    Any of the " [place] "    Chief Complaint  Patient presents with  . Medical Management of Chronic Issues    5 mo f/u     HPI: Patient is a 80 y.o. female seen in the office today for 5 month routine follow up. Pt with a pmh of dementia, htn, hyperlipidemia, CAD, allergic rhinitis, CHF, vit d def, OP.  Having nausea, possible related to Aricept and Namenda. Taking ginger root for nausea  Daughter here at visit today- from [place].  [place] while she was visiting. Having shoulder pain, iced shoulder and took ibuprofen.  Question about vit d- took many years to get her up to therapeutic range on Vit d 50000 units.  Great appetite. Eating out a lot.  No swelling in LE.  Shortness of breath with activity, getting dressed.  Chair yoga when she is home.  Participating in physical thearpy   Increase in burping- daughter reports worsening GERD . Review of Systems:  Review of Systems  Constitutional: Negative for fever, activity change and appetite change.  HENT: Negative for hearing loss, postnasal drip, rhinorrhea, sinus pressure, sneezing and sore throat.   Eyes:       Glasses  Respiratory: Negative for cough and shortness of breath.   Cardiovascular: Negative for chest pain, palpitations and leg swelling.  Gastrointestinal: Positive for nausea (occasionally). Negative for abdominal pain, constipation and blood in stool.  Genitourinary: Negative for difficulty urinating.  Musculoskeletal: Positive for arthralgias (worse pain to right shoulder after recent fall, conts with PT. bilateral knee  OA).  Psychiatric/Behavioral: Positive for  confusion (memory loss).    Past Medical History  Diagnosis Date  . Hyperlipidemia   . Hypertension   . Carotid artery occlusion   . Fall at home Sept. 2013  . Memory disorder 10/24/2014   Past Surgical History  Procedure Laterality Date  . Carotid endarterectomy      left CEA  . Tonsillectomy    . Appendectomy    . Breast reduction surgery    . Skin cancer removal    . Cataract extraction Bilateral   . Colonoscopy  2015  . Eye surgery      cataracts removed, bilaterally    Social History:   reports that she quit smoking about 21 years ago. Her smoking use included Cigarettes. She quit after 14 years of use. She has never used smokeless tobacco. She reports that she does not drink alcohol or use illicit drugs.  Family History  Problem Relation Age of Onset  . Heart disease Father     Before age 24  . Hypertension Father   . Heart attack Father   . Cancer Mother 31    Brain  . Dementia Brother   . Heart failure Maternal Grandmother   . Diabetes Maternal Grandmother   . Stroke Neg Hx   . Cancer Maternal Aunt 61    breast cancer  . Diabetes Maternal Aunt     Medications: Patient's Medications  New Prescriptions   No medications on file  Previous Medications   ARTIFICIAL TEAR OP    Place 2 drops into both  eyes daily.    ASPIRIN 81 MG TABLET    Take 81 mg by mouth daily.   ATORVASTATIN (LIPITOR) 20 MG TABLET    Take 1 tablet (20 mg total) by mouth daily.   B COMPLEX VITAMINS TABLET    Take 1 tablet by mouth daily.   BIOTIN 5000 MCG TABS    Take 1 tablet by mouth daily.   BYSTOLIC 5 MG TABLET    take 1 tablet by mouth once daily   CHOLECALCIFEROL (VITAMIN D) 2000 UNITS CAPS    Take 1 capsule by mouth daily.   CO-ENZYME Q-10 50 MG CAPSULE    Take 100 mg by mouth daily.    DICLOFENAC (VOLTAREN) 75 MG EC TABLET    Take 1 tablet (75 mg total) by mouth 2 (two) times daily as needed (arthritic pain).   DONEPEZIL (ARICEPT) 10 MG TABLET    Take 1 tablet (10 mg total) by  mouth at bedtime.   FUROSEMIDE (LASIX) 20 MG TABLET    Take 1 tablet (20 mg total) and 1 tablet (40 mg total) together  to equal 60 mg total by mouth  every other day.   FUROSEMIDE (LASIX) 40 MG TABLET    Take 1 tablet (40 mg total) and 1 tablet (20 mg total) together to equal 60 mg total every other day.   IPRATROPIUM (ATROVENT) 0.03 % NASAL SPRAY    Place 2 sprays into both nostrils 4 (four) times daily.    LANSOPRAZOLE (PREVACID) 15 MG CAPSULE    Take 15 mg by mouth daily at 12 noon.   LOSARTAN (COZAAR) 50 MG TABLET    Take one tablet by mouth once daily for blood pressure   MEMANTINE (NAMENDA) 10 MG TABLET    Take 1 tablet (10 mg total) by mouth 2 (two) times daily.   MULTIPLE VITAMINS-MINERALS (ALIVE WOMENS 50+ PO)    Take 1 tablet by mouth daily. (ALIVE WOMENS 50+)   MULTIPLE VITAMINS-MINERALS (PRESERVISION AREDS 2) CAPS    Take 1 capsule by mouth daily.   SACCHAROMYCES BOULARDII (FLORASTOR) 250 MG CAPSULE    Take 250 mg by mouth 2 (two) times a week. Reported on 01/17/2016   VAYACOG 100-19.5-6.5 MG CAPS    take 1 capsule by mouth once daily  Modified Medications   No medications on file  Discontinued Medications   No medications on file     Physical Exam:  Filed Vitals:   01/17/16 1439  BP: 130/82  Pulse: 74  Temp: 98 F (36.7 C)  TempSrc: Oral  Resp: 20  Height: 5\' 5"  (1.651 m)  Weight: 183 lb (83.008 kg)  SpO2: 91%   Body mass index is 30.45 kg/(m^2).  Physical Exam  Constitutional: She appears well-developed and well-nourished.  HENT:  Head: Normocephalic and atraumatic.  Nose: Nose normal.  Mouth/Throat: Oropharynx is clear and moist. No oropharyngeal exudate.  Eyes: Conjunctivae and EOM are normal. Pupils are equal, round, and reactive to light.  Neck: Normal range of motion. Neck supple.  Cardiovascular: Normal rate, regular rhythm and normal heart sounds.   Pulmonary/Chest: Effort normal and breath sounds normal.  Abdominal: Soft. Bowel sounds are normal. She  exhibits no distension.  Musculoskeletal: She exhibits tenderness (to right upper arm, limited ROM to shoulder). She exhibits no edema.  Neurological: She is alert.  Skin: Skin is warm and dry.  Psychiatric: She has a normal mood and affect.    Labs reviewed: Basic Metabolic Panel:  Recent Labs  04/17/15 1127  05/15/15 1453 08/16/15 1552  NA 138 142 142  K 4.1 4.4 4.9  CL 102 99 99  CO2 32 29 27  GLUCOSE 83 88 75  BUN 25* 19 19  CREATININE 1.06 1.14* 1.05*  CALCIUM 9.0 9.2 9.1   Liver Function Tests:  Recent Labs  08/01/15 0857  AST 28   No results for input(s): LIPASE, AMYLASE in the last 8760 hours. No results for input(s): AMMONIA in the last 8760 hours. CBC:  Recent Labs  04/12/15 1533 08/16/15 1552  WBC 10.3 8.3  NEUTROABS 6.2 5.0  HGB 14.3  --   HCT 43.2 40.9  MCV 86.2 87  PLT 276.0 263   Lipid Panel:  Recent Labs  05/24/15 1108 08/01/15 0857  CHOL 228* 167  HDL 59.10 61.40  LDLCALC  --  81  TRIG 234.0* 119.0  CHOLHDL 4 3  LDLDIRECT 142.0  --    TSH: No results for input(s): TSH in the last 8760 hours. A1C: Lab Results  Component Value Date   HGBA1C 5.6 04/12/2015     Assessment/Plan 1. Vitamin D deficiency -previously on vit D 50,000 units, daughter reports it took a long time to get pt back to therapeutic range. Currently on vit D 2000 units daily.  - Vitamin D, 25-hydroxy  2. Chronic diastolic congestive heart failure ([place]) -following with cardiology, evolemic at this time. conts on lasix 60 mg every other day. Will follow up labs.  - Basic metabolic panel  3. Essential hypertension -blood pressure stable, will cont current regimen  4. Memory disorder -stable, conts on aricept and namenda  5. Gastroesophageal reflux disease without esophagitis -worsening symptoms, will stop prevacid and start protonix, also discussed changing diet. Pt eating out a lot which could be exacerbating symptoms.  - pantoprazole (PROTONIX) 40 MG  tablet; Take 1 tablet (40 mg total) by mouth daily.  Dispense: 30 tablet; Refill: 3  6. Primary osteoarthritis of both knees - diclofenac sodium (VOLTAREN) 1 % GEL; Apply 2 g topically 4 (four) times daily as needed.  Dispense: 100 g; Refill: 3  Follow up with Dr Mariea Clonts in 4 weeks.   Carlos American. Harle Battiest  [facility] [phone] 8 am - 5 pm) [phone] (after hours)

## 2016-01-18 LAB — BASIC METABOLIC PANEL
BUN/Creatinine Ratio: 23 (ref 11–26)
BUN: 24 mg/dL (ref 8–27)
CALCIUM: 9.5 mg/dL (ref 8.7–10.3)
CO2: 27 mmol/L (ref 18–29)
CREATININE: 1.03 mg/dL — AB (ref 0.57–1.00)
Chloride: 100 mmol/L (ref 96–106)
GFR calc Af Amer: 61 mL/min/{1.73_m2} (ref >59)
GFR, EST NON AFRICAN AMERICAN: 53 mL/min/{1.73_m2} — AB (ref >59)
GLUCOSE: 88 mg/dL (ref 65–99)
Potassium: 4 mmol/L (ref 3.5–5.2)
Sodium: 149 mmol/L — ABNORMAL HIGH (ref 134–144)

## 2016-01-18 LAB — CBC WITH DIFFERENTIAL/PLATELET

## 2016-01-18 LAB — VITAMIN D 25 HYDROXY (VIT D DEFICIENCY, FRACTURES): Vit D, 25-Hydroxy: 40.4 ng/mL (ref 30.0–100.0)

## 2016-01-21 ENCOUNTER — Other Ambulatory Visit (INDEPENDENT_AMBULATORY_CARE_PROVIDER_SITE_OTHER): Payer: Medicare Other | Admitting: *Deleted

## 2016-01-21 DIAGNOSIS — I10 Essential (primary) hypertension: Secondary | ICD-10-CM | POA: Diagnosis not present

## 2016-01-21 DIAGNOSIS — R0602 Shortness of breath: Secondary | ICD-10-CM | POA: Diagnosis not present

## 2016-01-21 DIAGNOSIS — E785 Hyperlipidemia, unspecified: Secondary | ICD-10-CM

## 2016-01-21 LAB — BASIC METABOLIC PANEL
BUN: 23 mg/dL (ref 7–25)
CALCIUM: 9.6 mg/dL (ref 8.6–10.4)
CO2: 32 mmol/L — ABNORMAL HIGH (ref 20–31)
Chloride: 97 mmol/L — ABNORMAL LOW (ref 98–110)
Creat: 1.31 mg/dL — ABNORMAL HIGH (ref 0.60–0.93)
Glucose, Bld: 102 mg/dL — ABNORMAL HIGH (ref 65–99)
POTASSIUM: 4.6 mmol/L (ref 3.5–5.3)
SODIUM: 140 mmol/L (ref 135–146)

## 2016-01-22 ENCOUNTER — Other Ambulatory Visit: Payer: Self-pay

## 2016-01-22 DIAGNOSIS — I10 Essential (primary) hypertension: Secondary | ICD-10-CM

## 2016-01-22 LAB — BRAIN NATRIURETIC PEPTIDE: BRAIN NATRIURETIC PEPTIDE: 185 pg/mL — AB (ref <100)

## 2016-01-28 ENCOUNTER — Other Ambulatory Visit (INDEPENDENT_AMBULATORY_CARE_PROVIDER_SITE_OTHER): Payer: Medicare Other | Admitting: *Deleted

## 2016-01-28 DIAGNOSIS — I10 Essential (primary) hypertension: Secondary | ICD-10-CM

## 2016-01-28 DIAGNOSIS — R262 Difficulty in walking, not elsewhere classified: Secondary | ICD-10-CM | POA: Diagnosis not present

## 2016-01-28 DIAGNOSIS — M25511 Pain in right shoulder: Secondary | ICD-10-CM | POA: Diagnosis not present

## 2016-01-28 LAB — CBC WITH DIFFERENTIAL/PLATELET
Basophils Absolute: 0 10*3/uL (ref 0.0–0.1)
Basophils Relative: 0 % (ref 0–1)
Eosinophils Absolute: 0.2 10*3/uL (ref 0.0–0.7)
Eosinophils Relative: 2 % (ref 0–5)
HCT: 41.1 % (ref 36.0–46.0)
Hemoglobin: 13.7 g/dL (ref 12.0–15.0)
Lymphocytes Relative: 22 % (ref 12–46)
Lymphs Abs: 1.8 10*3/uL (ref 0.7–4.0)
MCH: 29 pg (ref 26.0–34.0)
MCHC: 33.3 g/dL (ref 30.0–36.0)
MCV: 87.1 fL (ref 78.0–100.0)
MPV: 10.5 fL (ref 8.6–12.4)
Monocytes Absolute: 0.9 10*3/uL (ref 0.1–1.0)
Monocytes Relative: 11 % (ref 3–12)
Neutro Abs: 5.3 10*3/uL (ref 1.7–7.7)
Neutrophils Relative %: 65 % (ref 43–77)
Platelets: 249 10*3/uL (ref 150–400)
RBC: 4.72 MIL/uL (ref 3.87–5.11)
RDW: 14.4 % (ref 11.5–15.5)
WBC: 8.1 10*3/uL (ref 4.0–10.5)

## 2016-01-28 NOTE — Addendum Note (Signed)
Addended by: Eulis Foster on: 01/28/2016 03:10 PM   Modules accepted: Orders

## 2016-01-31 ENCOUNTER — Ambulatory Visit (INDEPENDENT_AMBULATORY_CARE_PROVIDER_SITE_OTHER)
Admission: RE | Admit: 2016-01-31 | Discharge: 2016-01-31 | Disposition: A | Discharge status: Home / Self Care | Payer: Medicare Other | Source: Ambulatory Visit | Attending: Pulmonary Disease | Admitting: Pulmonary Disease

## 2016-01-31 ENCOUNTER — Other Ambulatory Visit (INDEPENDENT_AMBULATORY_CARE_PROVIDER_SITE_OTHER): Payer: Medicare Other

## 2016-01-31 ENCOUNTER — Ambulatory Visit (INDEPENDENT_AMBULATORY_CARE_PROVIDER_SITE_OTHER): Payer: Medicare Other | Admitting: Pulmonary Disease

## 2016-01-31 ENCOUNTER — Encounter: Payer: Self-pay | Admitting: Pulmonary Disease

## 2016-01-31 VITALS — BP 120/64 | HR 81 | Ht 65.5 in | Wt 184.0 lb

## 2016-01-31 DIAGNOSIS — R0600 Dyspnea, unspecified: Secondary | ICD-10-CM

## 2016-01-31 DIAGNOSIS — R05 Cough: Secondary | ICD-10-CM | POA: Diagnosis not present

## 2016-01-31 DIAGNOSIS — R059 Cough, unspecified: Secondary | ICD-10-CM

## 2016-01-31 DIAGNOSIS — R5383 Other fatigue: Secondary | ICD-10-CM | POA: Diagnosis not present

## 2016-01-31 DIAGNOSIS — R509 Fever, unspecified: Secondary | ICD-10-CM | POA: Diagnosis not present

## 2016-01-31 DIAGNOSIS — R0602 Shortness of breath: Secondary | ICD-10-CM | POA: Diagnosis not present

## 2016-01-31 HISTORY — DX: Cough, unspecified: R05.9

## 2016-01-31 LAB — CBC WITH DIFFERENTIAL/PLATELET
BASOS ABS: 0 10*3/uL (ref 0.0–0.1)
Basophils Relative: 0.2 % (ref 0.0–3.0)
EOS ABS: 0.1 10*3/uL (ref 0.0–0.7)
Eosinophils Relative: 1.3 % (ref 0.0–5.0)
HEMATOCRIT: 41.8 % (ref 36.0–46.0)
Hemoglobin: 13.9 g/dL (ref 12.0–15.0)
LYMPHS ABS: 1 10*3/uL (ref 0.7–4.0)
LYMPHS PCT: 10.4 % — AB (ref 12.0–46.0)
MCHC: 33.2 g/dL (ref 30.0–36.0)
MCV: 84.8 fl (ref 78.0–100.0)
MONOS PCT: 9 % (ref 3.0–12.0)
Monocytes Absolute: 0.8 10*3/uL (ref 0.1–1.0)
NEUTROS ABS: 7.2 10*3/uL (ref 1.4–7.7)
NEUTROS PCT: 79.1 % — AB (ref 43.0–77.0)
PLATELETS: 239 10*3/uL (ref 150.0–400.0)
RBC: 4.93 Mil/uL (ref 3.87–5.11)
RDW: 14.6 % (ref 11.5–15.5)
WBC: 9.1 10*3/uL (ref 4.0–10.5)

## 2016-01-31 LAB — TROPONIN I: TNIDX: 0.01 ug/l (ref 0.00–0.06)

## 2016-01-31 LAB — BRAIN NATRIURETIC PEPTIDE: PRO B NATRI PEPTIDE: 423 pg/mL — AB (ref 0.0–100.0)

## 2016-01-31 LAB — CARDIAC PANEL
CK TOTAL: 302 U/L — AB (ref 7–177)
CK-MB: 10.1 ng/mL — ABNORMAL HIGH (ref 0.3–4.0)
RELATIVE INDEX: 3.3 calc — AB (ref 0.0–2.5)

## 2016-01-31 MED ORDER — LEVALBUTEROL HCL 0.63 MG/3ML IN NEBU
0.6300 mg | INHALATION_SOLUTION | Freq: Once | RESPIRATORY_TRACT | Status: AC
  Administered 2016-01-31: 0.63 mg via RESPIRATORY_TRACT

## 2016-01-31 NOTE — Patient Instructions (Signed)
   Seek immediate medical attention for any clinical worsening.  We will let you know the test results from this afternoon.  We will see you back in 2-3 weeks.  TESTS ORDERED: 1. Serum lab tests 2. Chest x-ray PA/LAT

## 2016-01-31 NOTE — Progress Notes (Signed)
Subjective:    Patient ID: Virginia [name], female    DOB: February 12, 1936, 80 y.o.   MRN: [id number]  [facility].:  Acute visit for possible pneumonia.  HPI Patient was last hospitalized in January 2016 for continued acquired pneumonia. Has not been seen in our office since that time. Patient has known history of memory disorder and chronic hypoxic respiratory failure. Her brother reports that she has had progressively worsening dyspnea over the last 24-48 hours and a cough that is nonproductive. Dyspnea is primarily on exertion. No subjective fever, chills, or sweats. She reports lack of energy. She has had some wheezing. She did provide most of her history with her brother present. No sick contacts. Returned from [place] a couple of weeks ago. She is not weighing every day and is using Lasix every other day. 2 weeks ago was started on Protonix for reflux. She was reportedly fairly active and they deny any recent lower extremity edema.  Review of Systems Denies any sore throat or sinus congestion. Denies any nausea, emesis, or diarrhea. No chest pain, pressure, or tightness.   Allergies  Allergen Reactions  . Lidocaine Anaphylaxis, Swelling, Rash and Other (See Comments)    Any of the " [facility] "    Current Outpatient Prescriptions on File Prior to Visit  Medication Sig Dispense Refill  . ARTIFICIAL TEAR OP Place 2 drops into both eyes daily.     Marland Kitchen aspirin 81 MG tablet Take 81 mg by mouth daily.    Marland Kitchen atorvastatin (LIPITOR) 20 MG tablet Take 1 tablet (20 mg total) by mouth daily. 90 tablet 3  . b complex vitamins tablet Take 1 tablet by mouth daily.    . Biotin 5000 MCG TABS Take 1 tablet by mouth daily.    Marland Kitchen BYSTOLIC 5 MG tablet take 1 tablet by mouth once daily 90 tablet 0  . Cholecalciferol (VITAMIN D) 2000 UNITS CAPS Take 1 capsule by mouth daily.    Marland Kitchen co-enzyme Q-10 50 MG capsule Take 100 mg by mouth daily.     . diclofenac sodium (VOLTAREN) 1 % GEL Apply 2 g topically 4 (four) times daily  as needed. 100 g 3  . donepezil (ARICEPT) 10 MG tablet Take 1 tablet (10 mg total) by mouth at bedtime. 30 tablet 6  . furosemide (LASIX) 20 MG tablet Take 1 tablet (20 mg total) and 1 tablet (40 mg total) together  to equal 60 mg total by mouth  every other day. 90 tablet 3  . furosemide (LASIX) 40 MG tablet Take 1 tablet (40 mg total) and 1 tablet (20 mg total) together to equal 60 mg total every other day. 90 tablet 2  . ipratropium (ATROVENT) 0.03 % nasal spray Place 2 sprays into both nostrils 4 (four) times daily.     Marland Kitchen losartan (COZAAR) 50 MG tablet Take one tablet by mouth once daily for blood pressure 90 tablet 1  . memantine (NAMENDA) 10 MG tablet Take 1 tablet (10 mg total) by mouth 2 (two) times daily. 60 tablet 6  . Multiple Vitamins-Minerals (ALIVE WOMENS 50+ PO) Take 1 tablet by mouth daily. ([place] 50+)    . Multiple Vitamins-Minerals (PRESERVISION AREDS 2) CAPS Take 1 capsule by mouth daily.    . pantoprazole (PROTONIX) 40 MG tablet Take 1 tablet (40 mg total) by mouth daily. 30 tablet 3  . saccharomyces boulardii (FLORASTOR) 250 MG capsule Take 250 mg by mouth 2 (two) times a week. Reported on 01/17/2016    .  VAYACOG 100-19.5-6.5 MG CAPS take 1 capsule by mouth once daily 30 capsule 6   No current facility-administered medications on file prior to visit.    Past Medical History  Diagnosis Date  . Hyperlipidemia   . Hypertension   . Carotid artery occlusion   . Fall at home Sept. 2013  . Memory disorder 10/24/2014    Past Surgical History  Procedure Laterality Date  . Carotid endarterectomy      left CEA  . Tonsillectomy    . Appendectomy    . Breast reduction surgery    . Skin cancer removal    . Cataract extraction Bilateral   . Colonoscopy  2015  . Eye surgery      cataracts removed, bilaterally     Family History  Problem Relation Age of Onset  . Heart disease Father     Before age 71  . Hypertension Father   . Heart attack Father   . Cancer  Mother 50    Brain  . Dementia Brother   . Heart failure Maternal Grandmother   . Diabetes Maternal Grandmother   . Stroke Neg Hx   . Cancer Maternal Aunt 61    breast cancer  . Diabetes Maternal Aunt     Social History   Social History  . Marital Status: Divorced    Spouse Name: N/A  . Number of Children: 2  . Years of Education: 7   Occupational History  . retired    Social History Main Topics  . Smoking status: Former Smoker -- 14 years    Types: Cigarettes    Quit date: 11/30/1994  . Smokeless tobacco: Never Used     Comment: very light smoker, not even every day   . Alcohol Use: No  . Drug Use: No  . Sexual Activity: No   Other Topics Concern  . None   Social History Narrative   Diet:       Do you drink/eat things with caffeine: yes      Marital Status: Divorced   What Year Married:1959      Do you live in a house, apartment, Assisted Living, [place], trailer? House      Is it one or more stories? 2 stories      How many persons live in your home? Just Patient      Do you have any pets in your home? 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 Very Little   Type and how often? Walk      Do you have a living will? None   DNR?   Discuss one? No      Do you have signed POA/HPOA forms?      Patient drinks 1 cup of caffeine daily.   Patient is right handed.      Objective:   Physical Exam BP 120/64 mmHg  Pulse 81  Ht 5' 5.5" (1.664 m)  Wt 184 lb (83.462 kg)  BMI 30.14 kg/m2  SpO2 90% General:  Awake. Alert. No acute distress. Accompanied by brother today. Integument:  Warm & dry. No rash on exposed skin. Marland Kitchen Lymphatics:  No appreciated cervical or supraclavicular lymphadenoapthy. HEENT:  Moist mucus membranes. No oral ulcers. No scleral injection or icterus.  Cardiovascular:  Regular rate. No edema. No appreciable JVD.  Pulmonary:  Coarse wheeze in bases with more musical wheeze in mid & upper lung zones. Symmetric  chest wall  expansion. No accessory muscle use on room air. Patient had persistent and unchanged wheezing after Xopenex nebulizer treatment. Abdomen: Soft. Normal bowel sounds. Protuberant. Neurological:  CN 2-12 grossly in tact. No meningismus. Moving all 4 extremities equally.   PFT 12/15/14: FVC 0.89 L (33%) FEV1 0.49 L (24%) FEV1/FVC 0.55 FEF 25-75 0.21 L (14%) no bronchodilator response DLCO uncorrected 46% (unable to obtain lung volumes & DLCO with inspiratory volume less than 85% of VC)  IMAGING CXR PA/LAT 12/29/14 (personally reviewed by me): Mild hyperinflation. No focal opacity or effusion appreciated. Heart normal in size. Mediastinum normal in contour.  EKG 01/31/16 (personally reviewed by me):  Normal sinus rhythm. QTc 444ms. No evidence of ischemia & similar to prior EKG from May 2016.  CARDIAC TTE (12/07/14): LV normal in size with EF 65-70%. Grade 1 diastolic dysfunction. No regional wall motion abnormalities. LA & RA normal in size. RV normal in size and function. No aortic stenosis or regurgitation. No mitral stenosis or regurgitation. No pulmonic stenosis or regurgitation. No tricuspid regurgitation. No pericardial effusion.   LABS 01/28/16 CBC: 8.1/13.7/41.1/249  01/21/16 BNP: 185 BMP:  140/4.6/97/32/23/1.31/102/9.6    Assessment & Plan:  80 year old Caucasian female with prior history of COPD on chronic diuretic therapy. Patient and brother report increased dyspnea and fatigue over the last 24-48 hours as well as cough. I'm skeptical that this represents an acute bronchitis/COPD exacerbation given the patient's continued wheezing after nebulizer therapy. Patient's baseline weight is unclear and she has no symptoms that would suggest infection at this time. I am worried about a potential cardiac etiology to the patient's ongoing complaints. Patient's EKG shows no acute abnormalities when compared with prior EKG from May 2016.  1. Dyspnea with fatigue: Checking serum CBC,  troponin I, CK-MB, & BNP. 2. Cough: Checking chest x-ray PA/LAT. 3. Follow-up: Patient to return to clinic in 2-3 weeks. Instructed Brother take the patient to hospital for further evaluation if she worsens clinically.  Sonia Baller Ashok Cordia, M.D. [facility] Pager:  [phone] After 3pm or if no response, call [phone] 2:55 PM 01/31/2016

## 2016-02-01 ENCOUNTER — Telehealth: Payer: Self-pay | Admitting: Internal Medicine

## 2016-02-01 NOTE — Telephone Encounter (Signed)
Spoke with patient's brother, Virginia [name]. He updated that patient saw pulmonary yesterday and her BNP was elevated. Pt's BP was on the low side today, 95/66 and she is tired.  Yesterday it was higher than normal 134/78.  Advised that Dr. Ashok Cordia had sent a note to Dr. Harrington Challenger and we would like her to come next week for an appointment. Brother states that afternoons are better.  Added in to Thursday 3/9 at 2:00 pm.  She currently takes her lasix 60 mg every other day. Advised will call back if Dr. Harrington Challenger recommends any changes prior to appointment.

## 2016-02-01 NOTE — Telephone Encounter (Signed)
Spoke with Di Kindle.  He is going to give patient 40 mg lasix today, 60 mg on Saturday, 40 mg on Sunday and see Dr. Harrington Challenger on Monday.  He is appreciative for the call back and instructions.

## 2016-02-01 NOTE — Telephone Encounter (Signed)
She saw her Pulmonary doctor yesterday and was told to call you today. He said he would give you all the details.

## 2016-02-01 NOTE — Telephone Encounter (Signed)
Alternate 60 mg with 40 mg daily lasix. I will see on Monday

## 2016-02-01 NOTE — Telephone Encounter (Signed)
Patients brother called back. He just got back to patient's house and she is wheezing currently. She is agreeable to Monday 3/6 at 9:15, appointment changed accordingly.  Pt took NO lasix today, due to take 60 mg tomorrow.

## 2016-02-03 NOTE — Progress Notes (Signed)
Cardiology Office Note   Date:  02/04/2016   ID:  [name], DOB 14-Jun-1936, MRN [id number]  PCP:  Lauree Chandler, NP  Cardiologist:   Dorris Carnes, MD   F/U of HTN, diastolic CHF    History of Present Illness: Virginia [name] is a 80 y.o. female with a history of  Hyperlipdemia, HTN, CV dz (s/p L CEA), chronic diastolic CHF and dementia. I saw her in clinic in clinic earlier this winter Family called in last week  Pt very SOB  Wt up I recomm increasing lasix to 60 alt with 40 mg qd Daughter arrived from [facility] over the weekend Sats when she arrived were in 52s    Now she says pt is breathing some better   Eating a lot      Outpatient Prescriptions Prior to Visit  Medication Sig Dispense Refill  . ARTIFICIAL TEAR OP Place 2 drops into both eyes daily.     Marland Kitchen aspirin 81 MG tablet Take 81 mg by mouth daily.    Marland Kitchen atorvastatin (LIPITOR) 20 MG tablet Take 1 tablet (20 mg total) by mouth daily. 90 tablet 3  . b complex vitamins tablet Take 1 tablet by mouth daily.    . Biotin 5000 MCG TABS Take 1 tablet by mouth daily.    Marland Kitchen BYSTOLIC 5 MG tablet take 1 tablet by mouth once daily 90 tablet 0  . Cholecalciferol (VITAMIN D) 2000 UNITS CAPS Take 1 capsule by mouth daily.    Marland Kitchen co-enzyme Q-10 50 MG capsule Take 100 mg by mouth daily.     . diclofenac sodium (VOLTAREN) 1 % GEL Apply 2 g topically 4 (four) times daily as needed. 100 g 3  . donepezil (ARICEPT) 10 MG tablet Take 1 tablet (10 mg total) by mouth at bedtime. 30 tablet 6  . furosemide (LASIX) 20 MG tablet Take 1 tablet (20 mg total) and 1 tablet (40 mg total) together  to equal 60 mg total by mouth  every other day. 90 tablet 3  . furosemide (LASIX) 40 MG tablet Take 1 tablet (40 mg total) and 1 tablet (20 mg total) together to equal 60 mg total every other day. 90 tablet 2  . ipratropium (ATROVENT) 0.03 % nasal spray Place 2 sprays into both nostrils 4 (four) times daily.     Marland Kitchen losartan (COZAAR) 50 MG tablet Take one tablet by  mouth once daily for blood pressure 90 tablet 1  . memantine (NAMENDA) 10 MG tablet Take 1 tablet (10 mg total) by mouth 2 (two) times daily. 60 tablet 6  . Multiple Vitamins-Minerals (ALIVE WOMENS 50+ PO) Take 1 tablet by mouth daily. ([place] 50+)    . Multiple Vitamins-Minerals (PRESERVISION AREDS 2) CAPS Take 1 capsule by mouth daily.    . pantoprazole (PROTONIX) 40 MG tablet Take 1 tablet (40 mg total) by mouth daily. 30 tablet 3  . saccharomyces boulardii (FLORASTOR) 250 MG capsule Take 250 mg by mouth 2 (two) times a week. Reported on 01/17/2016    . VAYACOG 100-19.5-6.5 MG CAPS take 1 capsule by mouth once daily 30 capsule 6   No facility-administered medications prior to visit.     Allergies:   Lidocaine   Past Medical History  Diagnosis Date  . Hyperlipidemia   . Hypertension   . Carotid artery occlusion   . Fall at home Sept. 2013  . Memory disorder 10/24/2014    Past Surgical History  Procedure Laterality Date  .  Carotid endarterectomy      left CEA  . Tonsillectomy    . Appendectomy    . Breast reduction surgery    . Skin cancer removal    . Cataract extraction Bilateral   . Colonoscopy  2015  . Eye surgery      cataracts removed, bilaterally      Social History:  The patient  reports that she quit smoking about 21 years ago. Her smoking use included Cigarettes. She quit after 14 years of use. She has never used smokeless tobacco. She reports that she does not drink alcohol or use illicit drugs.   Family History:  The patient's family history includes Cancer (age of onset: 35) in her mother; Cancer (age of onset: 108) in her maternal aunt; Dementia in her brother; Diabetes in her maternal aunt and maternal grandmother; Heart attack in her father; Heart disease in her father; Heart failure in her maternal grandmother; Hypertension in her father. There is no history of Stroke.    ROS:  Please see the history of present illness. All other systems are reviewed  and  Negative to the above problem except as noted.    PHYSICAL EXAM: VS:  BP 110/60 mmHg  Pulse 78  Ht 5' 5.5" (1.664 m)  Wt 185 lb 6.4 oz (84.097 kg)  BMI 30.37 kg/m2  SpO2 93%  GEN: Well nourished, well developed, in no acute distress HEENT: normal Neck: no JVD, carotid bruits, or masses Cardiac: RRR; no murmurs, rubs, or gallops, Tr edema  Respiratory:  clear to auscultation bilaterally, normal work of breathing GI: soft, nontender, nondistended, + BS  No hepatomegaly  MS: no deformity Moving all extremities   Skin: warm and dry, no rash Neuro:  Strength and sensation are intact Psych: euthymic mood, full affect   EKG:  EKG is not ordered today.   Lipid Panel    Component Value Date/Time   CHOL 167 08/01/2015 0857   TRIG 119.0 08/01/2015 0857   HDL 61.40 08/01/2015 0857   CHOLHDL 3 08/01/2015 0857   VLDL 23.8 08/01/2015 0857   LDLCALC 81 08/01/2015 0857   LDLDIRECT 142.0 05/24/2015 1108      Wt Readings from Last 3 Encounters:  02/04/16 185 lb 6.4 oz (84.097 kg)  01/31/16 184 lb (83.462 kg)  01/17/16 183 lb (83.008 kg)      ASSESSMENT AND PLAN: 1  Acute on chronic diastolic CHF   VOlum status may be up a little  Not bad   Sats 93. I would keep on current regimen  Wt is up and some is fluid I am not sure what tipper her to retain more. WIll be in touch with pt once results reviewed  2.  HTN  Adequate control     Pt has appt with me in a few wks     Signed, Dorris Carnes, MD  02/04/2016 10:24 AM    [facility] [place] [place]  [address] Phone: [phone]; Fax: [phone]

## 2016-02-04 ENCOUNTER — Ambulatory Visit (INDEPENDENT_AMBULATORY_CARE_PROVIDER_SITE_OTHER): Payer: Medicare Other | Admitting: Internal Medicine

## 2016-02-04 ENCOUNTER — Encounter: Payer: Self-pay | Admitting: Internal Medicine

## 2016-02-04 VITALS — BP 110/60 | HR 78 | Ht 65.5 in | Wt 185.4 lb

## 2016-02-04 DIAGNOSIS — I509 Heart failure, unspecified: Secondary | ICD-10-CM | POA: Diagnosis not present

## 2016-02-04 DIAGNOSIS — I5032 Chronic diastolic (congestive) heart failure: Secondary | ICD-10-CM | POA: Diagnosis not present

## 2016-02-04 LAB — BASIC METABOLIC PANEL
BUN: 18 mg/dL (ref 7–25)
CALCIUM: 9.1 mg/dL (ref 8.6–10.4)
CO2: 31 mmol/L (ref 20–31)
CREATININE: 1.24 mg/dL — AB (ref 0.60–0.93)
Chloride: 95 mmol/L — ABNORMAL LOW (ref 98–110)
GLUCOSE: 89 mg/dL (ref 65–99)
Potassium: 4.8 mmol/L (ref 3.5–5.3)
Sodium: 137 mmol/L (ref 135–146)

## 2016-02-04 NOTE — Patient Instructions (Signed)
Your physician recommends that you continue on your current medications as directed. Please refer to the Current Medication list given to you today. Your physician recommends that you return for lab work in: today (bmet, bnp)

## 2016-02-05 ENCOUNTER — Telehealth: Payer: Self-pay | Admitting: *Deleted

## 2016-02-05 ENCOUNTER — Emergency Department (INDEPENDENT_AMBULATORY_CARE_PROVIDER_SITE_OTHER)
Admission: EM | Admit: 2016-02-05 | Discharge: 2016-02-05 | Disposition: A | Discharge status: Home / Self Care | Payer: Medicare Other | Source: Home / Self Care | Attending: Family Medicine | Admitting: Family Medicine

## 2016-02-05 ENCOUNTER — Encounter (HOSPITAL_COMMUNITY): Payer: Self-pay | Admitting: *Deleted

## 2016-02-05 ENCOUNTER — Emergency Department (INDEPENDENT_AMBULATORY_CARE_PROVIDER_SITE_OTHER): Payer: Medicare Other

## 2016-02-05 ENCOUNTER — Telehealth: Payer: Self-pay | Admitting: Internal Medicine

## 2016-02-05 DIAGNOSIS — J069 Acute upper respiratory infection, unspecified: Secondary | ICD-10-CM

## 2016-02-05 DIAGNOSIS — R05 Cough: Secondary | ICD-10-CM | POA: Diagnosis not present

## 2016-02-05 DIAGNOSIS — Z79899 Other long term (current) drug therapy: Secondary | ICD-10-CM

## 2016-02-05 LAB — BRAIN NATRIURETIC PEPTIDE: Brain Natriuretic Peptide: 110.3 pg/mL — ABNORMAL HIGH (ref <100)

## 2016-02-05 MED ORDER — DEXTROMETHORPHAN POLISTIREX ER 30 MG/5ML PO SUER: 60.0000 mg | Freq: Two times a day (BID) | ORAL | Status: DC

## 2016-02-05 NOTE — Discharge Instructions (Signed)
Drink plenty of fluids as discussed, use mucinex or delsym for cough. Return or see your doctor if further problems °

## 2016-02-05 NOTE — ED Notes (Signed)
Pt  Reports  Symptoms  Of   Cough   Congested        Rattling  Cough      Seen   X   2 last week     By  Her  Cardiologist     And     Her  Pulmonologist        She  Gets  Short  Of  Breath  On  Exertion      Recent  Med  Changes          Lasix  Was  Increased

## 2016-02-05 NOTE — Telephone Encounter (Signed)
Virginia [name], daughter called and stated that her mother has crackling on right side of lungs, Increased coughing and no energy. Daughter feels like she has pneumonia. Next available appointment is Thursday but don't feel like patient should wait till then. Daughter was upset that there was no available appointment for the sick, wanted to bring her now. Instructed her to take patient to Urgent Care to be evaluated. Daughter agreed.

## 2016-02-05 NOTE — Telephone Encounter (Signed)
Patient at urgent care at this time. Daughter (DPR) requested lab results. Per Dr. Harrington Challenger, keep on same lasix dosing Fluid mildly improved  Have pt come in on Fri for BMET. Patient will come in on Friday for lab work.

## 2016-02-05 NOTE — Telephone Encounter (Signed)
Pt was seen yesterday by Dr Geni Bers seem to be worse.She can hear her wheezing across the room. She wonder if her medicine should be increased. Please call to advise.

## 2016-02-05 NOTE — ED Provider Notes (Signed)
CSN: [id number]     Arrival date & time 02/05/16  1641 History   First MD Initiated Contact with Patient 02/05/16 1736     Chief Complaint  Patient presents with  . Cough   (Consider location/radiation/quality/duration/timing/severity/associated sxs/prior Treatment) Patient is a 80 y.o. female presenting with cough. The history is provided by the patient and a relative.  Cough Cough characteristics:  Non-productive, dry and harsh Severity:  Moderate Onset quality:  Gradual Duration:  2 weeks Progression:  Worsening Chronicity:  Chronic (seen by card and meds adjusted last week but sx have relapsed today.) Smoker: no   Associated symptoms: shortness of breath   Associated symptoms: no chills, no fever and no rhinorrhea     Past Medical History  Diagnosis Date  . Hyperlipidemia   . Hypertension   . Carotid artery occlusion   . Fall at home Sept. 2013  . Memory disorder 10/24/2014   Past Surgical History  Procedure Laterality Date  . Carotid endarterectomy      left CEA  . Tonsillectomy    . Appendectomy    . Breast reduction surgery    . Skin cancer removal    . Cataract extraction Bilateral   . Colonoscopy  2015  . Eye surgery      cataracts removed, bilaterally    Family History  Problem Relation Age of Onset  . Heart disease Father     Before age 86  . Hypertension Father   . Heart attack Father   . Cancer Mother 18    Brain  . Dementia Brother   . Heart failure Maternal Grandmother   . Diabetes Maternal Grandmother   . Stroke Neg Hx   . Cancer Maternal Aunt 61    breast cancer  . Diabetes Maternal Aunt    Social History  Substance Use Topics  . Smoking status: Former Smoker -- 14 years    Types: Cigarettes    Quit date: 11/30/1994  . Smokeless tobacco: Never Used     Comment: very light smoker, not even every day   . Alcohol Use: No   OB History    No data available     Review of Systems  Constitutional: Negative.  Negative for fever and  chills.  HENT: Negative.  Negative for rhinorrhea.   Respiratory: Positive for cough and shortness of breath.   Cardiovascular: Negative.   Gastrointestinal: Negative.   All other systems reviewed and are negative.   Allergies  Lidocaine  Home Medications   Prior to Admission medications   Medication Sig Start Date End Date Taking? Authorizing Provider  ARTIFICIAL TEAR OP Place 2 drops into both eyes daily.     Historical Provider, MD  aspirin 81 MG tablet Take 81 mg by mouth daily.    Historical Provider, MD  atorvastatin (LIPITOR) 20 MG tablet Take 1 tablet (20 mg total) by mouth daily. 05/31/15   Fay Records, MD  b complex vitamins tablet Take 1 tablet by mouth daily.    Historical Provider, MD  Biotin 5000 MCG TABS Take 1 tablet by mouth daily.    Historical Provider, MD  BYSTOLIC 5 MG tablet take 1 tablet by mouth once daily 11/27/15   Fay Records, MD  Cholecalciferol (VITAMIN D) 2000 UNITS CAPS Take 1 capsule by mouth daily.    Historical Provider, MD  co-enzyme Q-10 50 MG capsule Take 100 mg by mouth daily.     Historical Provider, MD  dextromethorphan (DELSYM) 30 MG/5ML liquid  Take 10 mLs (60 mg total) by mouth 2 (two) times daily. Prn cough 02/05/16   Billy Fischer, MD  diclofenac sodium (VOLTAREN) 1 % GEL Apply 2 g topically 4 (four) times daily as needed. 01/17/16   Lauree Chandler, NP  donepezil (ARICEPT) 10 MG tablet Take 1 tablet (10 mg total) by mouth at bedtime. 11/12/15   Dennie Bible, NP  furosemide (LASIX) 20 MG tablet Take 1 tablet (20 mg total) and 1 tablet (40 mg total) together  to equal 60 mg total by mouth  every other day. 10/12/15   Fay Records, MD  furosemide (LASIX) 40 MG tablet Take 1 tablet (40 mg total) and 1 tablet (20 mg total) together to equal 60 mg total every other day. 10/12/15   Fay Records, MD  ipratropium (ATROVENT) 0.03 % nasal spray Place 2 sprays into both nostrils 4 (four) times daily.     Historical Provider, MD  losartan (COZAAR)  50 MG tablet Take one tablet by mouth once daily for blood pressure 11/27/15   Lauree Chandler, NP  memantine (NAMENDA) 10 MG tablet Take 1 tablet (10 mg total) by mouth 2 (two) times daily. 11/12/15   Dennie Bible, NP  Multiple Vitamins-Minerals (ALIVE WOMENS 50+ PO) Take 1 tablet by mouth daily. ([place] 50+)    Historical Provider, MD  Multiple Vitamins-Minerals (PRESERVISION AREDS 2) CAPS Take 1 capsule by mouth daily.    Historical Provider, MD  pantoprazole (PROTONIX) 40 MG tablet Take 1 tablet (40 mg total) by mouth daily. 01/17/16   Lauree Chandler, NP  saccharomyces boulardii (FLORASTOR) 250 MG capsule Take 250 mg by mouth 2 (two) times a week. Reported on 01/17/2016    Historical Provider, MD  VAYACOG 100-19.5-6.5 MG CAPS take 1 capsule by mouth once daily 11/26/15   Kathrynn Ducking, MD   Meds Ordered and Administered this Visit  Medications - No data to display  BP 161/84 mmHg  Pulse 86  Temp(Src) 98.6 F (37 C) (Oral)  Resp 18  SpO2 93% No data found.   Physical Exam  Constitutional: She appears well-developed and well-nourished. No distress.  Eyes: Pupils are equal, round, and reactive to light.  Neck: Normal range of motion. Neck supple.  Cardiovascular: Normal rate, regular rhythm and normal heart sounds.   Pulmonary/Chest: Effort normal. No respiratory distress. She has decreased breath sounds. She has rhonchi. She exhibits no tenderness.  Abdominal: Soft. Bowel sounds are normal. There is no tenderness.  Musculoskeletal: She exhibits no edema.  Neurological: She is alert.  Nursing note and vitals reviewed.   ED Course  Procedures (including critical care time)  Labs Review Labs Reviewed - No data to display  Imaging Review Dg Chest 2 View  02/05/2016  CLINICAL DATA:  Cough for 6 weeks.  Chronic bronchitis. EXAM: CHEST  2 VIEW COMPARISON:  01/31/2016 FINDINGS: The heart size and mediastinal contours are within normal limits. Both lungs are clear.  No evidence of pneumothorax or pleural effusion. Pulmonary hyperinflation is suspicious for COPD. IMPRESSION: Suspect COPD.  No active cardiopulmonary disease. Electronically Signed   By: Earle Gell M.D.   On: 02/05/2016 18:10    X-rays reviewed and report per radiologist.  Visual Acuity Review  Right Eye Distance:   Left Eye Distance:   Bilateral Distance:    Right Eye Near:   Left Eye Near:    Bilateral Near:         MDM  1. URI (upper respiratory infection)        Billy Fischer, MD 02/05/16 [id number]

## 2016-02-06 ENCOUNTER — Telehealth: Payer: Self-pay | Admitting: *Deleted

## 2016-02-06 DIAGNOSIS — I10 Essential (primary) hypertension: Secondary | ICD-10-CM

## 2016-02-06 DIAGNOSIS — I5031 Acute diastolic (congestive) heart failure: Secondary | ICD-10-CM

## 2016-02-06 DIAGNOSIS — E871 Hypo-osmolality and hyponatremia: Secondary | ICD-10-CM

## 2016-02-06 NOTE — Telephone Encounter (Signed)
-----   Message from Fay Records, MD sent at 02/05/2016 11:28 AM EST ----- Keep on same lasix dosing  Fluid mildly improved  Have pt come in on Fri for BMET

## 2016-02-06 NOTE — Telephone Encounter (Signed)
Notified the pt and brother Virginia [name] (on [place]) that per Dr Harrington Challenger, based on her recent labs she would like to keep the pt on the same dose of lasix, for her fluid has mildly improved and have her come in for a BMET this Friday.  Scheduled the pt to come in for lab this Friday 02/08/16 to check a bmet.  Both verbalized understanding and agree with this plan.

## 2016-02-07 ENCOUNTER — Ambulatory Visit: Payer: Medicare Other | Admitting: Internal Medicine

## 2016-02-08 ENCOUNTER — Other Ambulatory Visit: Payer: Medicare Other

## 2016-02-12 ENCOUNTER — Other Ambulatory Visit (INDEPENDENT_AMBULATORY_CARE_PROVIDER_SITE_OTHER): Payer: Medicare Other | Admitting: *Deleted

## 2016-02-12 DIAGNOSIS — Z79899 Other long term (current) drug therapy: Secondary | ICD-10-CM | POA: Diagnosis not present

## 2016-02-12 LAB — BASIC METABOLIC PANEL
BUN: 16 mg/dL (ref 7–25)
CHLORIDE: 97 mmol/L — AB (ref 98–110)
CO2: 34 mmol/L — AB (ref 20–31)
CREATININE: 1.44 mg/dL — AB (ref 0.60–0.93)
Calcium: 9 mg/dL (ref 8.6–10.4)
GLUCOSE: 82 mg/dL (ref 65–99)
POTASSIUM: 4.9 mmol/L (ref 3.5–5.3)
Sodium: 142 mmol/L (ref 135–146)

## 2016-02-15 ENCOUNTER — Telehealth: Payer: Self-pay

## 2016-02-15 DIAGNOSIS — Z79899 Other long term (current) drug therapy: Secondary | ICD-10-CM

## 2016-02-15 DIAGNOSIS — R0602 Shortness of breath: Secondary | ICD-10-CM

## 2016-02-15 NOTE — Telephone Encounter (Signed)
-----   Message from [place], MD sent at 02/13/2016  8:52 AM EDT ----- Take lasix 40 for 2 days then 60 for 1 day and repeat BMET in 10 days with BNP Ask pt how swelling is.

## 2016-02-15 NOTE — Telephone Encounter (Signed)
Called patient's brother Virginia [name]) with lab results. Per Dr. Harrington Challenger, Take lasix 40 for 2 days then 60 for 1 day and repeat. BMET in 10 days with BNP. Ask pt how swelling is. Patient's swelling in better. Patient will make Lasix changes. Patient has appointment on 02/25/16 with Dr. Harrington Challenger and will have labs check that day. Patient's brother verbalized understanding.

## 2016-02-18 ENCOUNTER — Other Ambulatory Visit: Payer: Self-pay | Admitting: Internal Medicine

## 2016-02-18 DIAGNOSIS — R262 Difficulty in walking, not elsewhere classified: Secondary | ICD-10-CM | POA: Diagnosis not present

## 2016-02-18 DIAGNOSIS — M25511 Pain in right shoulder: Secondary | ICD-10-CM | POA: Diagnosis not present

## 2016-02-19 ENCOUNTER — Encounter: Payer: Self-pay | Admitting: Adult Health

## 2016-02-19 ENCOUNTER — Ambulatory Visit (INDEPENDENT_AMBULATORY_CARE_PROVIDER_SITE_OTHER): Payer: Medicare Other | Admitting: Adult Health

## 2016-02-19 VITALS — BP 112/62 | HR 69 | Ht 65.5 in | Wt 184.0 lb

## 2016-02-19 DIAGNOSIS — J449 Chronic obstructive pulmonary disease, unspecified: Secondary | ICD-10-CM | POA: Diagnosis not present

## 2016-02-19 MED ORDER — FLUTICASONE FUROATE-VILANTEROL 100-25 MCG/INH IN AEPB: 1.0000 | INHALATION_SPRAY | Freq: Every day | RESPIRATORY_TRACT | Status: DC

## 2016-02-19 NOTE — Assessment & Plan Note (Signed)
Severe COPD w/ no desats with walk test  Plan  Refer to pulmonary rehab.  Begin BREO daily .  Follow up .with Dr. Melvyn Novas  In 2  Months and As needed   Please contact office for sooner follow up if symptoms do not improve or worsen or seek emergency care

## 2016-02-19 NOTE — Patient Instructions (Addendum)
You have severe COPD Stage IV , lung function at 29% . (on PFT)  Chest xray shows hyperinflation consistent with COPD.  Walk test in the office did not show any oxygen desaturations.  Refer to pulmonary rehab.  Begin BREO daily .  Follow up .with Dr. Melvyn Novas  In 2  Months and As needed   Please contact office for sooner follow up if symptoms do not improve or worsen or seek emergency care

## 2016-02-19 NOTE — Progress Notes (Signed)
Subjective:    Patient ID: Virginia [name], female    DOB: 1936-07-07, 80 y.o.   MRN: [id number]  HPI 80 yo female former smoker with GOLD IV COPD   TEST  PFTs 12/15/14 FEV1  0.57 (29%) ratio 54 p 17% resp to saba   02/19/2016 Follow up : COPD  Pt returns for 2 week follow up . She is accompanied by her brother .  She was recently seen in office with sob , CXR shows COPD changes . Labs with minimally elevated bnp and neg troponin . Seen by cardiology with lasix increased  She says she is feeling better w/ less dyspnea  Walk test in office showed no desats with walking.  She has several questions from daughter in an email regarding her breathing. They were answered and given to her brother.  Requested referral to pulmonary rehab which was done .  She is on no inhalers , previous PFT did show severe COPD with +BD response  Discussed starting  BREO .  She denies chest pain, orthopnea, fever or hemoptysis .           Past Medical History  Diagnosis Date  . Hyperlipidemia   . Hypertension   . Carotid artery occlusion   . Fall at home Sept. 2013  . Memory disorder 10/24/2014   Current Outpatient Prescriptions on File Prior to Visit  Medication Sig Dispense Refill  . ARTIFICIAL TEAR OP Place 2 drops into both eyes daily.     Marland Kitchen aspirin 81 MG tablet Take 81 mg by mouth daily.    Marland Kitchen atorvastatin (LIPITOR) 20 MG tablet Take 1 tablet (20 mg total) by mouth daily. 90 tablet 3  . b complex vitamins tablet Take 1 tablet by mouth daily.    . Biotin 5000 MCG TABS Take 1 tablet by mouth daily.    Marland Kitchen BYSTOLIC 5 MG tablet take 1 tablet by mouth once daily 90 tablet 3  . Cholecalciferol (VITAMIN D) 2000 UNITS CAPS Take 1 capsule by mouth daily.    Marland Kitchen co-enzyme Q-10 50 MG capsule Take 100 mg by mouth daily.     . diclofenac sodium (VOLTAREN) 1 % GEL Apply 2 g topically 4 (four) times daily as needed. 100 g 3  . donepezil (ARICEPT) 10 MG tablet Take 1 tablet (10 mg total) by mouth at bedtime. 30  tablet 6  . furosemide (LASIX) 20 MG tablet Take 1 tablet (20 mg total) and 1 tablet (40 mg total) together  to equal 60 mg total by mouth  every other day. 90 tablet 3  . furosemide (LASIX) 40 MG tablet Take 1 tablet (40 mg total) and 1 tablet (20 mg total) together to equal 60 mg total every other day. 90 tablet 2  . ipratropium (ATROVENT) 0.03 % nasal spray Place 2 sprays into both nostrils 4 (four) times daily.     Marland Kitchen losartan (COZAAR) 50 MG tablet Take one tablet by mouth once daily for blood pressure 90 tablet 1  . memantine (NAMENDA) 10 MG tablet Take 1 tablet (10 mg total) by mouth 2 (two) times daily. 60 tablet 6  . Multiple Vitamins-Minerals (ALIVE WOMENS 50+ PO) Take 1 tablet by mouth daily. ([place] 50+)    . Multiple Vitamins-Minerals (PRESERVISION AREDS 2) CAPS Take 1 capsule by mouth daily.    . pantoprazole (PROTONIX) 40 MG tablet Take 1 tablet (40 mg total) by mouth daily. 30 tablet 3  . saccharomyces boulardii (FLORASTOR) 250 MG capsule  Take 250 mg by mouth 2 (two) times a week. Reported on 01/17/2016    . VAYACOG 100-19.5-6.5 MG CAPS take 1 capsule by mouth once daily 30 capsule 6   No current facility-administered medications on file prior to visit.     Review of Systems Constitutional:   No  weight loss, night sweats,  Fevers, chills, fatigue, or  lassitude.  HEENT:   No headaches,  Difficulty swallowing,  Tooth/dental problems, or  Sore throat,                No sneezing, itching, ear ache, nasal congestion, post nasal drip,   CV:  No chest pain,  Orthopnea, PND, swelling in lower extremities, anasarca, dizziness, palpitations, syncope.   GI  No heartburn, indigestion, abdominal pain, nausea, vomiting, diarrhea, change in bowel habits, loss of appetite, bloody stools.   Resp:  .  No excess mucus, no productive cough,  No non-productive cough,  No coughing up of blood.  No change in color of mucus.  No wheezing.  No chest wall deformity  Skin: no rash or  lesions.  GU: no dysuria, change in color of urine, no urgency or frequency.  No flank pain, no hematuria   MS:  No joint pain or swelling.  No decreased range of motion.  No back pain.  Psych:  No change in mood or affect. No depression or anxiety.  +dementia         Objective:   Physical Exam Filed Vitals:   02/19/16 1614  BP: 112/62  Pulse: 69  Height: 5' 5.5" (1.664 m)  Weight: 184 lb (83.462 kg)  SpO2: 91%    GEN: A/Ox3; pleasant , NAD, elderly   HEENT:  [place]/AT,  EACs-clear, TMs-wnl, NOSE-clear, THROAT-clear, no lesions, no postnasal drip or exudate noted.   NECK:  Supple w/ fair ROM; no JVD; normal carotid impulses w/o bruits; no thyromegaly or nodules palpated; no lymphadenopathy.  RESP  Clear  P & A; w/o, wheezes/ rales/ or rhonchi.no accessory muscle use, no dullness to percussion  CARD:  RRR, no m/r/g  , no peripheral edema, pulses intact, no cyanosis or clubbing.  GI:   Soft & nt; nml bowel sounds; no organomegaly or masses detected.  Musco: Warm bil, no deformities or joint swelling noted.   Neuro: alert, no focal deficits noted.    Skin: Warm, no lesions or rashes  Nadyne Gariepy NP-C  [facility]  02/19/16        Assessment & Plan:

## 2016-02-20 NOTE — Progress Notes (Signed)
Chart and office note reviewed in detail  > agree with a/p as outlined    

## 2016-02-25 ENCOUNTER — Ambulatory Visit (INDEPENDENT_AMBULATORY_CARE_PROVIDER_SITE_OTHER): Payer: Medicare Other | Admitting: Internal Medicine

## 2016-02-25 ENCOUNTER — Other Ambulatory Visit (INDEPENDENT_AMBULATORY_CARE_PROVIDER_SITE_OTHER): Payer: Medicare Other | Admitting: *Deleted

## 2016-02-25 ENCOUNTER — Encounter: Payer: Self-pay | Admitting: Internal Medicine

## 2016-02-25 VITALS — BP 110/82 | HR 74 | Ht 65.5 in | Wt 183.0 lb

## 2016-02-25 DIAGNOSIS — I10 Essential (primary) hypertension: Secondary | ICD-10-CM

## 2016-02-25 DIAGNOSIS — I5032 Chronic diastolic (congestive) heart failure: Secondary | ICD-10-CM | POA: Diagnosis not present

## 2016-02-25 DIAGNOSIS — R0602 Shortness of breath: Secondary | ICD-10-CM

## 2016-02-25 DIAGNOSIS — Z79899 Other long term (current) drug therapy: Secondary | ICD-10-CM

## 2016-02-25 LAB — BASIC METABOLIC PANEL
BUN: 22 mg/dL (ref 7–25)
CALCIUM: 8.6 mg/dL (ref 8.6–10.4)
CHLORIDE: 103 mmol/L (ref 98–110)
CO2: 25 mmol/L (ref 20–31)
Creat: 1.33 mg/dL — ABNORMAL HIGH (ref 0.60–0.93)
Glucose, Bld: 94 mg/dL (ref 65–99)
Potassium: 4.2 mmol/L (ref 3.5–5.3)
SODIUM: 143 mmol/L (ref 135–146)

## 2016-02-25 NOTE — Patient Instructions (Signed)
Your physician recommends that you continue on your current medications as directed. Please refer to the Current Medication list given to you today. Your physician recommends that you return for lab work TODAY  Please weigh yourself each morning.  Your physician recommends that you schedule a follow-up appointment ---May 09, 2016.  See above.

## 2016-02-25 NOTE — Addendum Note (Signed)
Addended by: Eulis Foster on: 02/25/2016 02:34 PM   Modules accepted: Orders

## 2016-02-25 NOTE — Progress Notes (Signed)
Cardiology Office Note   Date:  02/25/2016   ID:  Virginia [name], DOB 10/22/1936, MRN [id number]  PCP:  Lauree Chandler, NP  Cardiologist:   Dorris Carnes, MD    F?U of HTN and diastolic CHF     History of Present Illness: Virginia [name] is a 80 y.o. female with a history of HTN, HL, CV dz, chronic diastolic CHF and dementia  I saw her in early march  At that time I recomm continued increased lasix    Seen by Selinda Orion and T Parrett on 3/21  PFTs in Jan 2016 FEV1 0.57  Started BREO and set up for pulmonary rehab   Since I saw her she says that her breathing is better  She denies CP    Outpatient Prescriptions Prior to Visit  Medication Sig Dispense Refill  . ARTIFICIAL TEAR OP Place 2 drops into both eyes daily.     Marland Kitchen aspirin 81 MG tablet Take 81 mg by mouth daily.    Marland Kitchen atorvastatin (LIPITOR) 20 MG tablet Take 1 tablet (20 mg total) by mouth daily. 90 tablet 3  . b complex vitamins tablet Take 1 tablet by mouth daily.    . Biotin 5000 MCG TABS Take 1 tablet by mouth daily.    Marland Kitchen BYSTOLIC 5 MG tablet take 1 tablet by mouth once daily 90 tablet 3  . Cholecalciferol (VITAMIN D) 2000 UNITS CAPS Take 1 capsule by mouth daily.    Marland Kitchen co-enzyme Q-10 50 MG capsule Take 100 mg by mouth daily.     . diclofenac sodium (VOLTAREN) 1 % GEL Apply 2 g topically 4 (four) times daily as needed. 100 g 3  . donepezil (ARICEPT) 10 MG tablet Take 1 tablet (10 mg total) by mouth at bedtime. 30 tablet 6  . fluticasone furoate-vilanterol (BREO ELLIPTA) 100-25 MCG/INH AEPB Inhale 1 puff into the lungs daily. 30 each 5  . furosemide (LASIX) 20 MG tablet Take 1 tablet (20 mg total) and 1 tablet (40 mg total) together  to equal 60 mg total by mouth  every other day. 90 tablet 3  . furosemide (LASIX) 40 MG tablet Take 1 tablet (40 mg total) and 1 tablet (20 mg total) together to equal 60 mg total every other day. 90 tablet 2  . ipratropium (ATROVENT) 0.03 % nasal spray Place 2 sprays into both nostrils 4 (four) times  daily.     Marland Kitchen losartan (COZAAR) 50 MG tablet Take one tablet by mouth once daily for blood pressure 90 tablet 1  . memantine (NAMENDA) 10 MG tablet Take 1 tablet (10 mg total) by mouth 2 (two) times daily. 60 tablet 6  . Multiple Vitamins-Minerals (ALIVE WOMENS 50+ PO) Take 1 tablet by mouth daily. ([place] 50+)    . Multiple Vitamins-Minerals (PRESERVISION AREDS 2) CAPS Take 1 capsule by mouth daily.    . pantoprazole (PROTONIX) 40 MG tablet Take 1 tablet (40 mg total) by mouth daily. 30 tablet 3  . saccharomyces boulardii (FLORASTOR) 250 MG capsule Take 250 mg by mouth 2 (two) times a week. Reported on 01/17/2016    . VAYACOG 100-19.5-6.5 MG CAPS take 1 capsule by mouth once daily 30 capsule 6   No facility-administered medications prior to visit.     Allergies:   Lidocaine   Past Medical History  Diagnosis Date  . Hyperlipidemia   . Hypertension   . Carotid artery occlusion   . Fall at home Sept. 2013  .  Memory disorder 10/24/2014    Past Surgical History  Procedure Laterality Date  . Carotid endarterectomy      left CEA  . Tonsillectomy    . Appendectomy    . Breast reduction surgery    . Skin cancer removal    . Cataract extraction Bilateral   . Colonoscopy  2015  . Eye surgery      cataracts removed, bilaterally      Social History:  The patient  reports that she quit smoking about 21 years ago. Her smoking use included Cigarettes. She quit after 14 years of use. She has never used smokeless tobacco. She reports that she does not drink alcohol or use illicit drugs.   Family History:  The patient's family history includes Cancer (age of onset: 97) in her mother; Cancer (age of onset: 28) in her maternal aunt; Dementia in her brother; Diabetes in her maternal aunt and maternal grandmother; Heart attack in her father; Heart disease in her father; Heart failure in her maternal grandmother; Hypertension in her father. There is no history of Stroke.    ROS:  Please see  the history of present illness. All other systems are reviewed and  Negative to the above problem except as noted.    PHYSICAL EXAM: VS:  BP 110/82 mmHg  Pulse 74  Ht 5' 5.5" (1.664 m)  Wt 183 lb (83.008 kg)  BMI 29.98 kg/m2  SpO2 93%  GEN: Well nourished, well developed, in no acute distress HEENT: normal Neck: no JVD, carotid bruits, or masses Cardiac: RRR; no murmurs, rubs, or gallops,no edema  Respiratory:  Mild Decreased airflow with mild wheeze GI: soft, nontender, nondistended, + BS  No hepatomegaly  MS: no deformity Moving all extremities   Skin: warm and dry, no rash Neuro:  Strength and sensation are intact Psych: euthymic mood, full affect   EKG:  EKG is not ordered today.   Lipid Panel    Component Value Date/Time   CHOL 167 08/01/2015 0857   TRIG 119.0 08/01/2015 0857   HDL 61.40 08/01/2015 0857   CHOLHDL 3 08/01/2015 0857   VLDL 23.8 08/01/2015 0857   LDLCALC 81 08/01/2015 0857   LDLDIRECT 142.0 05/24/2015 1108      Wt Readings from Last 3 Encounters:  02/25/16 183 lb (83.008 kg)  02/19/16 184 lb (83.462 kg)  02/04/16 185 lb 6.4 oz (84.097 kg)      ASSESSMENT AND PLAN:  1  Chronic diastolic CHF VOlume status is better  Check BMET and BNP  Wtch salt  Weigh daily  Get labs today    2.  COPD  Followed in clinic  Should be getting set up for pulmnary rehab.  3  HTN  GOod control    I will see her at the end of June     Signed, Dorris Carnes, MD  02/25/2016 2:51 PM    [address], [place], [place]  [address] Phone: [phone]; Fax: [phone]

## 2016-02-26 LAB — BRAIN NATRIURETIC PEPTIDE: Brain Natriuretic Peptide: 152.3 pg/mL — ABNORMAL HIGH (ref <100)

## 2016-03-03 DIAGNOSIS — R262 Difficulty in walking, not elsewhere classified: Secondary | ICD-10-CM | POA: Diagnosis not present

## 2016-03-03 DIAGNOSIS — M25511 Pain in right shoulder: Secondary | ICD-10-CM | POA: Diagnosis not present

## 2016-03-04 ENCOUNTER — Telehealth: Payer: Self-pay | Admitting: Internal Medicine

## 2016-03-04 NOTE — Telephone Encounter (Signed)
New MESSAGE    Pt brother wants to know the weight of last office visit so that he can keep track

## 2016-03-04 NOTE — Telephone Encounter (Signed)
LAST  WEIGHT  GIVEN AS  REQUESTED  183 PER BROTHER  SAME   ON HOME  SCALE .Adonis Housekeeper

## 2016-03-10 DIAGNOSIS — R262 Difficulty in walking, not elsewhere classified: Secondary | ICD-10-CM | POA: Diagnosis not present

## 2016-03-10 DIAGNOSIS — M25511 Pain in right shoulder: Secondary | ICD-10-CM | POA: Diagnosis not present

## 2016-03-27 DIAGNOSIS — J3489 Other specified disorders of nose and nasal sinuses: Secondary | ICD-10-CM | POA: Diagnosis not present

## 2016-03-27 DIAGNOSIS — Z888 Allergy status to other drugs, medicaments and biological substances status: Secondary | ICD-10-CM | POA: Diagnosis not present

## 2016-03-27 DIAGNOSIS — I509 Heart failure, unspecified: Secondary | ICD-10-CM | POA: Diagnosis not present

## 2016-03-27 DIAGNOSIS — Z79899 Other long term (current) drug therapy: Secondary | ICD-10-CM | POA: Diagnosis not present

## 2016-03-27 DIAGNOSIS — J449 Chronic obstructive pulmonary disease, unspecified: Secondary | ICD-10-CM | POA: Diagnosis not present

## 2016-03-27 DIAGNOSIS — Z7951 Long term (current) use of inhaled steroids: Secondary | ICD-10-CM | POA: Diagnosis not present

## 2016-03-27 DIAGNOSIS — E785 Hyperlipidemia, unspecified: Secondary | ICD-10-CM | POA: Diagnosis not present

## 2016-03-27 DIAGNOSIS — Z87891 Personal history of nicotine dependence: Secondary | ICD-10-CM | POA: Diagnosis not present

## 2016-03-27 DIAGNOSIS — J310 Chronic rhinitis: Secondary | ICD-10-CM | POA: Diagnosis not present

## 2016-03-27 DIAGNOSIS — R0982 Postnasal drip: Secondary | ICD-10-CM | POA: Diagnosis not present

## 2016-03-27 DIAGNOSIS — I110 Hypertensive heart disease with heart failure: Secondary | ICD-10-CM | POA: Diagnosis not present

## 2016-03-31 DIAGNOSIS — M25511 Pain in right shoulder: Secondary | ICD-10-CM | POA: Diagnosis not present

## 2016-03-31 DIAGNOSIS — R262 Difficulty in walking, not elsewhere classified: Secondary | ICD-10-CM | POA: Diagnosis not present

## 2016-04-03 DIAGNOSIS — H15012 Anterior scleritis, left eye: Secondary | ICD-10-CM | POA: Diagnosis not present

## 2016-04-03 DIAGNOSIS — H1589 Other disorders of sclera: Secondary | ICD-10-CM | POA: Diagnosis not present

## 2016-04-21 ENCOUNTER — Ambulatory Visit (INDEPENDENT_AMBULATORY_CARE_PROVIDER_SITE_OTHER): Payer: Medicare Other | Admitting: Internal Medicine

## 2016-04-21 ENCOUNTER — Encounter: Payer: Self-pay | Admitting: Internal Medicine

## 2016-04-21 VITALS — BP 116/74 | HR 70 | Ht 63.0 in | Wt 187.8 lb

## 2016-04-21 DIAGNOSIS — J449 Chronic obstructive pulmonary disease, unspecified: Secondary | ICD-10-CM

## 2016-04-21 NOTE — Progress Notes (Signed)
Subjective:   Patient ID: Virginia [name], female      DOB: [birth year]      MRN: [id number]      Brief patient profile:  80 yo obese wf  quit smoking 1995  with GOLD IV COPD criteria 12/15/14 though ratio 54% with reversibility      02/19/2016 NP Follow up : COPD  Pt returns for 2 week follow up . She is accompanied by her brother .  She was recently seen in office with sob , CXR shows COPD changes . Labs with minimally elevated bnp and neg troponin . Seen by cardiology with lasix increased  She says she is feeling better w/ less dyspnea  Walk test in office showed no desats with walking.  She has several questions from daughter in an email regarding her breathing. They were answered and given to her brother.  Requested referral to pulmonary rehab which was done .  She is on no inhalers , previous PFT did show severe COPD with +BD response  rx   BREO .     04/21/2016  f/u ov/Doyne Micke re:  GOLD IV criteria/ maint on BREO daily / no saba  Chief Complaint  Patient presents with  . Follow-up    Breathing is doing well. She c/o not being able to taste. Has occ rhinitis.   denies any acitivity where her breathing limits her in any way   No obvious day to day or daytime variability or assoc excess/ purulent sputum or mucus plugs or hemoptysis or cp or chest tightness, subjective wheeze or overt sinus or hb symptoms. No unusual exp hx or h/o childhood pna/ asthma or knowledge of premature birth.  Sleeping ok without nocturnal  or early am exacerbation  of respiratory  c/o's or need for noct saba. Also denies any obvious fluctuation of symptoms with weather or environmental changes or other aggravating or alleviating factors except as outlined above   Current Medications, Allergies, Complete Past Medical History, Past Surgical History, Family History, and Social History were reviewed in [employer] record.  ROS  The following are not active complaints unless bolded sore  throat, dysphagia, dental problems, itching, sneezing,  nasal congestion or excess/ purulent secretions, ear ache,   fever, chills, sweats, unintended wt loss, classically pleuritic or exertional cp,  orthopnea pnd or leg swelling, presyncope, palpitations, abdominal pain, anorexia, nausea, vomiting, diarrhea  or change in bowel or bladder habits, change in stools or urine, dysuria,hematuria,  rash, arthralgias, visual complaints, headache, numbness, weakness or ataxia or problems with walking or coordination,  change in mood/affect or memory.          TEST  PFTs 12/15/14 FEV1  0.57 (29%) ratio 54 p 17% resp to saba                  Objective:   Physical Exam  Wt Readings from Last 3 Encounters:  04/21/16 187 lb 12.8 oz (85.186 kg)  02/25/16 183 lb (83.008 kg)  02/19/16 184 lb (83.462 kg)    Vital signs reviewed    GEN: A/Ox3; pleasant , NAD, elderly   HEENT:  [place]/AT,  EACs-clear, TMs-wnl, NOSE-clear, THROAT-clear, no lesions, no postnasal drip or exudate noted.   NECK:  Supple w/ fair ROM; no JVD; normal carotid impulses w/o bruits; no thyromegaly or nodules palpated; no lymphadenopathy.  RESP  Clear  P & A; w/o, wheezes/ rales/ or rhonchi.no accessory muscle use, no dullness to percussion  CARD:  RRR,  no m/r/g  , no peripheral edema, pulses intact, no cyanosis or clubbing.  GI:   Soft & nt; nml bowel sounds; no organomegaly or masses detected.  Musco: Warm bil, no deformities or joint swelling noted.   Neuro: alert, no focal deficits noted.    Skin: Warm, no lesions or rashes    I personally reviewed images and agree with radiology impression as follows:  CXR:  02/05/16  Suspect COPD. No active cardiopulmonary disease.          Assessment & Plan:

## 2016-04-21 NOTE — Patient Instructions (Signed)
Return if limited from activities by your breathing  -  We can refer you to pulmonary rehab if your want

## 2016-04-21 NOTE — Assessment & Plan Note (Signed)
PFTs 12/15/14 FEV1  0.57 (29%) ratio 54 p 17% resp to saba   I had an extended final summary discussion with the patient and brother reviewing all relevant studies completed to date and  lasting 15 to 20 minutes of a 25 minute visit on the following issues:    As I explained to this patient in detail:  although there may be copd present, it may not be clinically relevant:   it does not appear to be limiting activity tolerance any more than a set of worn tires limits someone from driving a car  around a parking lot.  A new set of Michelins might look good but would have no perceived impact on the performance of the car and would not be worth the cost.  That is to say:   this pt is so sedentary I don't recommend aggressive pulmonary rx at this point unless limiting symptoms arise or acute exacerbations become as issue, neither of which is the case now.  I asked the patient to contact this office at any time in the future should either of these problems arise.    rec maintain Breo [id number] one click each am  Each maintenance medication was reviewed in detail including most importantly the difference between maintenance and as needed and under what circumstances the prns are to be used.  Please see instructions for details which were reviewed in writing and the patient given a copy.

## 2016-04-23 DIAGNOSIS — R262 Difficulty in walking, not elsewhere classified: Secondary | ICD-10-CM | POA: Diagnosis not present

## 2016-04-23 DIAGNOSIS — M25511 Pain in right shoulder: Secondary | ICD-10-CM | POA: Diagnosis not present

## 2016-05-09 ENCOUNTER — Ambulatory Visit (INDEPENDENT_AMBULATORY_CARE_PROVIDER_SITE_OTHER): Payer: Medicare Other | Admitting: Internal Medicine

## 2016-05-09 ENCOUNTER — Encounter: Payer: Self-pay | Admitting: Internal Medicine

## 2016-05-09 VITALS — BP 90/50 | HR 60 | Ht 62.5 in | Wt 187.8 lb

## 2016-05-09 DIAGNOSIS — I10 Essential (primary) hypertension: Secondary | ICD-10-CM

## 2016-05-09 DIAGNOSIS — Z79899 Other long term (current) drug therapy: Secondary | ICD-10-CM

## 2016-05-09 LAB — BASIC METABOLIC PANEL
BUN: 23 mg/dL (ref 7–25)
CALCIUM: 9.1 mg/dL (ref 8.6–10.4)
CO2: 32 mmol/L — ABNORMAL HIGH (ref 20–31)
CREATININE: 1.59 mg/dL — AB (ref 0.60–0.93)
Chloride: 102 mmol/L (ref 98–110)
Glucose, Bld: 103 mg/dL — ABNORMAL HIGH (ref 65–99)
Potassium: 4.4 mmol/L (ref 3.5–5.3)
SODIUM: 143 mmol/L (ref 135–146)

## 2016-05-09 NOTE — Progress Notes (Signed)
Cardiology Office Note   Date:  05/09/2016   ID:  CHAMPAIGN WANGELIN, DOB 09-18-1936, MRN [id number]  PCP:  Lauree Chandler, NP  Cardiologist:   Dorris Carnes, MD   F/U of diastolic CHF    History of Present Illness: Virginia [name] is a 80 y.o. female with a history ofHTN, HL, CV dz, chronic diastolic CHF and dementia I Seen by Selinda Orion and T Parrett on 3/21 PFTs in Jan 2016 FEV1 0.57 Started BREO and set up for pulmonary rehab   Since she was seen in clinic she says her breathing is good  Walking some  No CP  No significant edema   Outpatient Prescriptions Prior to Visit  Medication Sig Dispense Refill  . ARTIFICIAL TEAR OP Place 2 drops into both eyes daily.     Marland Kitchen aspirin 81 MG tablet Take 81 mg by mouth daily.    Marland Kitchen atorvastatin (LIPITOR) 20 MG tablet Take 1 tablet (20 mg total) by mouth daily. 90 tablet 3  . b complex vitamins tablet Take 1 tablet by mouth daily.    . Biotin 5000 MCG TABS Take 1 tablet by mouth daily.    Marland Kitchen BYSTOLIC 5 MG tablet take 1 tablet by mouth once daily 90 tablet 3  . Cholecalciferol (VITAMIN D) 2000 UNITS CAPS Take 1 capsule by mouth daily.    Marland Kitchen co-enzyme Q-10 50 MG capsule Take 100 mg by mouth daily.     . diclofenac sodium (VOLTAREN) 1 % GEL Apply 2 g topically 4 (four) times daily as needed. 100 g 3  . donepezil (ARICEPT) 10 MG tablet Take 1 tablet (10 mg total) by mouth at bedtime. 30 tablet 6  . fluticasone furoate-vilanterol (BREO ELLIPTA) 100-25 MCG/INH AEPB Inhale 1 puff into the lungs daily. 30 each 5  . furosemide (LASIX) 20 MG tablet Take 1 tablet (20 mg total) and 1 tablet (40 mg total) together  to equal 60 mg total by mouth  every other day. 90 tablet 3  . furosemide (LASIX) 40 MG tablet Take 1 tablet (40 mg total) and 1 tablet (20 mg total) together to equal 60 mg total every other day. 90 tablet 2  . ipratropium (ATROVENT) 0.03 % nasal spray Place 2 sprays into both nostrils 4 (four) times daily.     Marland Kitchen losartan (COZAAR) 50 MG tablet Take one  tablet by mouth once daily for blood pressure 90 tablet 1  . memantine (NAMENDA) 10 MG tablet Take 1 tablet (10 mg total) by mouth 2 (two) times daily. 60 tablet 6  . Multiple Vitamins-Minerals (ALIVE WOMENS 50+ PO) Take 1 tablet by mouth daily. ([place] 50+)    . Multiple Vitamins-Minerals (PRESERVISION AREDS 2) CAPS Take 1 capsule by mouth daily.    . pantoprazole (PROTONIX) 40 MG tablet Take 1 tablet (40 mg total) by mouth daily. 30 tablet 3  . saccharomyces boulardii (FLORASTOR) 250 MG capsule Take 250 mg by mouth 2 (two) times a week. Reported on 01/17/2016    . VAYACOG 100-19.5-6.5 MG CAPS take 1 capsule by mouth once daily 30 capsule 6   No facility-administered medications prior to visit.     Allergies:   Lidocaine   Past Medical History  Diagnosis Date  . Hyperlipidemia   . Hypertension   . Carotid artery occlusion   . Fall at home Sept. 2013  . Memory disorder 10/24/2014    Past Surgical History  Procedure Laterality Date  . Carotid endarterectomy  left CEA  . Tonsillectomy    . Appendectomy    . Breast reduction surgery    . Skin cancer removal    . Cataract extraction Bilateral   . Colonoscopy  2015  . Eye surgery      cataracts removed, bilaterally      Social History:  The patient  reports that she quit smoking about 21 years ago. Her smoking use included Cigarettes. She quit after 14 years of use. She has never used smokeless tobacco. She reports that she does not drink alcohol or use illicit drugs.   Family History:  The patient's family history includes Cancer (age of onset: 26) in her mother; Cancer (age of onset: 41) in her maternal aunt; Dementia in her brother; Diabetes in her maternal aunt and maternal grandmother; Heart attack in her father; Heart disease in her father; Heart failure in her maternal grandmother; Hypertension in her father. There is no history of Stroke.    ROS:  Please see the history of present illness. All other systems are  reviewed and  Negative to the above problem except as noted.    PHYSICAL EXAM: VS:  BP 90/50 mmHg  Pulse 60  Ht 5' 2.5" (1.588 m)  Wt 187 lb 12.8 oz (85.186 kg)  BMI 33.78 kg/m2  GEN: Well nourished, well developed, in no acute distress HEENT: normal Neck: no JVD, carotid bruits, or masses Cardiac: RRR; no murmurs, rubs, or gallops,no edema  Respiratory:  clear to auscultation bilaterally, normal work of breathing GI: soft, nontender, nondistended, + BS  No hepatomegaly  MS: no deformity Moving all extremities   Skin: warm and dry, no rash Neuro:  Strength and sensation are intact Psych: euthymic mood, full affect   EKG:  EKG is not ordered today.   Lipid Panel    Component Value Date/Time   CHOL 167 08/01/2015 0857   TRIG 119.0 08/01/2015 0857   HDL 61.40 08/01/2015 0857   CHOLHDL 3 08/01/2015 0857   VLDL 23.8 08/01/2015 0857   LDLCALC 81 08/01/2015 0857   LDLDIRECT 142.0 05/24/2015 1108      Wt Readings from Last 3 Encounters:  05/09/16 187 lb 12.8 oz (85.186 kg)  04/21/16 187 lb 12.8 oz (85.186 kg)  02/25/16 183 lb (83.008 kg)      ASSESSMENT AND PLAN:  1  Chronic diastolic CHF  Volume status looks good  I would check BMET today  2  HTN  BP is a little low  She denies dizzienss I would not switch meds  Follow  I will see in October  Sooner if problems develop  Pt instructed to weigh herself every day   Call if wt goes upl    F/U in October       Signed, Damier Disano, MD  05/09/2016 4:20 PM    [address], [place], [place]  [address] Phone: [phone]; Fax: [phone]

## 2016-05-09 NOTE — Patient Instructions (Signed)
Medication Instructions:  The current medical regimen is effective;  continue present plan and medications.  Labwork: Please have blood work today (BMP)  Follow-Up: Follow up in 08/2016 with Dr. Harrington Challenger.  You will receive a letter in the mail 2 months before you are due.  Please call us when you receive this letter to schedule your follow up appointment.  If you need a refill on your cardiac medications before your next appointment, please call your pharmacy.  Thank you for choosing [facility]!!

## 2016-05-12 ENCOUNTER — Encounter: Payer: Self-pay | Admitting: Nurse Practitioner

## 2016-05-12 ENCOUNTER — Ambulatory Visit (INDEPENDENT_AMBULATORY_CARE_PROVIDER_SITE_OTHER): Payer: Medicare Other | Admitting: Nurse Practitioner

## 2016-05-12 VITALS — BP 134/78 | HR 68 | Resp 20 | Ht 63.0 in | Wt 184.0 lb

## 2016-05-12 DIAGNOSIS — R413 Other amnesia: Secondary | ICD-10-CM

## 2016-05-12 MED ORDER — DONEPEZIL HCL 10 MG PO TABS: 10.0000 mg | ORAL_TABLET | Freq: Every day | ORAL | Status: DC

## 2016-05-12 MED ORDER — MEMANTINE HCL 10 MG PO TABS: 10.0000 mg | ORAL_TABLET | Freq: Two times a day (BID) | ORAL | Status: DC

## 2016-05-12 NOTE — Patient Instructions (Signed)
Continue Namenda and Aricept at current doses  Memory score is stable Follow-up in 6 months

## 2016-05-12 NOTE — Progress Notes (Signed)
[facility]  PATIENT: Virginia [name] DOB: 10-Mar-1936   REASON FOR VISIT: follow-up for memory loss HISTORY FROM:patient   HISTORY OF PRESENT ILLNESS:Virginia [name] is a 80 year old right-handed white female with a history of a progressive memory disorder. The patient has been placed on Aricept, and is also on Namenda. She is tolerating it fairly well.  The patient had some insomnia on Aricept initially. This is no longer the case. Currently, her brother is living with her, and her sister comes down once a month from [place], [place] to check on her. The sister does the finances, the brother keeps up with the medications and appointments. The patient does not operate a motor vehicle. She is independent in feeding dressing and bathing herself. She returned to this office for an evaluation.   REVIEW OF SYSTEMS: Full 14 system review of systems performed and notable only for those listed, all others are neg:  Constitutional: neg  Cardiovascular: neg Ear/Nose/Throat: neg  Skin: neg Eyes: neg Respiratory: neg Gastroitestinal: neg  Hematology/Lymphatic: neg  Endocrine: neg Musculoskeletal:neg Allergy/Immunology: neg Neurological: memory loss Psychiatric: neg Sleep : neg   ALLERGIES: Allergies  Allergen Reactions  . Lidocaine Anaphylaxis, Swelling, Rash and Other (See Comments)    Any of the " [facility] "    HOME MEDICATIONS: Outpatient Prescriptions Prior to Visit  Medication Sig Dispense Refill  . ARTIFICIAL TEAR OP Place 2 drops into both eyes daily.     Marland Kitchen aspirin 81 MG tablet Take 81 mg by mouth daily.    Marland Kitchen atorvastatin (LIPITOR) 20 MG tablet Take 1 tablet (20 mg total) by mouth daily. 90 tablet 3  . b complex vitamins tablet Take 1 tablet by mouth daily.    . Biotin 5000 MCG TABS Take 1 tablet by mouth daily.    Marland Kitchen BYSTOLIC 5 MG tablet take 1 tablet by mouth once daily 90 tablet 3  . Cholecalciferol (VITAMIN D) 2000 UNITS CAPS Take 1 capsule by mouth  daily.    Marland Kitchen co-enzyme Q-10 50 MG capsule Take 100 mg by mouth daily.     . diclofenac sodium (VOLTAREN) 1 % GEL Apply 2 g topically 4 (four) times daily as needed. 100 g 3  . donepezil (ARICEPT) 10 MG tablet Take 1 tablet (10 mg total) by mouth at bedtime. 30 tablet 6  . fluticasone furoate-vilanterol (BREO ELLIPTA) 100-25 MCG/INH AEPB Inhale 1 puff into the lungs daily. 30 each 5  . furosemide (LASIX) 20 MG tablet Take 1 tablet (20 mg total) and 1 tablet (40 mg total) together  to equal 60 mg total by mouth  every other day. 90 tablet 3  . furosemide (LASIX) 40 MG tablet Take 1 tablet (40 mg total) and 1 tablet (20 mg total) together to equal 60 mg total every other day. 90 tablet 2  . ipratropium (ATROVENT) 0.06 % nasal spray Place 2 sprays into both nostrils 2 (two) times daily.   1  . losartan (COZAAR) 50 MG tablet Take one tablet by mouth once daily for blood pressure 90 tablet 1  . memantine (NAMENDA) 10 MG tablet Take 1 tablet (10 mg total) by mouth 2 (two) times daily. 60 tablet 6  . Multiple Vitamins-Minerals (ALIVE WOMENS 50+ PO) Take 1 tablet by mouth daily. ([place] 50+)    . Multiple Vitamins-Minerals (PRESERVISION AREDS 2) CAPS Take 1 capsule by mouth daily.    . pantoprazole (PROTONIX) 40 MG tablet Take 1 tablet (40 mg total) by mouth  daily. 30 tablet 3  . saccharomyces boulardii (FLORASTOR) 250 MG capsule Take 250 mg by mouth 2 (two) times a week. Reported on 01/17/2016    . VAYACOG 100-19.5-6.5 MG CAPS take 1 capsule by mouth once daily 30 capsule 6  . ipratropium (ATROVENT) 0.03 % nasal spray Place 2 sprays into both nostrils 4 (four) times daily. Reported on 05/12/2016     No facility-administered medications prior to visit.    PAST MEDICAL HISTORY: Past Medical History  Diagnosis Date  . Hyperlipidemia   . Hypertension   . Carotid artery occlusion   . Fall at home Sept. 2013  . Memory disorder 10/24/2014    PAST SURGICAL HISTORY: Past Surgical History  Procedure  Laterality Date  . Carotid endarterectomy      left CEA  . Tonsillectomy    . Appendectomy    . Breast reduction surgery    . Skin cancer removal    . Cataract extraction Bilateral   . Colonoscopy  2015  . Eye surgery      cataracts removed, bilaterally     FAMILY HISTORY: Family History  Problem Relation Age of Onset  . Heart disease Father     Before age 8  . Hypertension Father   . Heart attack Father   . Cancer Mother 9    Brain  . Dementia Brother   . Heart failure Maternal Grandmother   . Diabetes Maternal Grandmother   . Stroke Neg Hx   . Cancer Maternal Aunt 61    breast cancer  . Diabetes Maternal Aunt     SOCIAL HISTORY: Social History   Social History  . Marital Status: Divorced    Spouse Name: N/A  . Number of Children: 2  . Years of Education: 7   Occupational History  . retired    Social History Main Topics  . Smoking status: Former Smoker -- 14 years    Types: Cigarettes    Quit date: 11/30/1994  . Smokeless tobacco: Never Used     Comment: very light smoker, not even every day   . Alcohol Use: No  . Drug Use: No  . Sexual Activity: No   Other Topics Concern  . Not on file   Social History Narrative   Diet:       Do you drink/eat things with caffeine: yes      Marital Status: Divorced   What Year Married:1959      Do you live in a house, apartment, Assisted Living, Condo, trailer? House      Is it one or more stories? 2 stories      How many persons live in your home? Just Patient      Do you have any pets in your home? 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 Very Little   Type and how often? Walk      Do you have a living will? None   DNR?   Discuss one? No      Do you have signed POA/HPOA forms?      Patient drinks 1 cup of caffeine daily.   Patient is right handed.     PHYSICAL EXAM  Filed Vitals:   05/12/16 1624  BP: 134/78  Pulse: 68  Resp: 20  Height: 5\' 3"  (1.6 m)    Weight: 184 lb (83.462 kg)   Body mass index is 32.6 kg/(m^2). General: The patient is  alert and cooperative at the time of the examination. Skin: No significant peripheral edema is noted.   Neurologic Exam Mental status: The patient is alert and oriented x 2 at the time of the examination (the patient is not oriented to year. Mini-Mental Status Examination done today shows a total score 22/30. The patient is able to name 5 animals in 30 seconds.Clock drawing 3/4.  Cranial nerves: Facial symmetry is present. Speech is normal, no aphasia or dysarthria is noted. Extraocular movements are full. Visual fields are full. Pupils are anisocoric, the left pupil is 5-6 mm, right pupil is 3 mm. Motor: The patient has good strength in all 4 extremities. Sensory examination: Soft touch sensation is symmetric on the face, arms, and legs. Coordination: The patient has good finger-nose-finger and heel-to-shin bilaterally. Gait and station: The patient has a normal gait. Tandem gait is mildly unsteady. Romberg is negative. No drift is seen.no assistive device Reflexes: Deep tendon reflexes are symmetric.   DIAGNOSTIC DATA (LABS, IMAGING, TESTING) - I reviewed patient records, labs, notes, testing and imaging myself where available.  Lab Results  Component Value Date   WBC 9.1 01/31/2016   HGB 13.9 01/31/2016   HCT 41.8 01/31/2016   MCV 84.8 01/31/2016   PLT 239.0 01/31/2016      Component Value Date/Time   NA 143 05/09/2016 1621   NA 149* 01/17/2016 1606   K 4.4 05/09/2016 1621   CL 102 05/09/2016 1621   CO2 32* 05/09/2016 1621   GLUCOSE 103* 05/09/2016 1621   GLUCOSE 88 01/17/2016 1606   BUN 23 05/09/2016 1621   BUN 24 01/17/2016 1606   CREATININE 1.59* 05/09/2016 1621   CREATININE 1.03* 01/17/2016 1606   CALCIUM 9.1 05/09/2016 1621   PROT 6.4 12/29/2014 1242   ALBUMIN 3.4* 12/29/2014 1242   AST 28 08/01/2015 0857   ALT 17 12/29/2014 1242   ALKPHOS 68 12/29/2014 1242   BILITOT 0.9  12/29/2014 1242   GFRNONAA 9* 01/17/2016 1606   GFRAA 61 01/17/2016 1606        ASSESSMENT AND PLAN 80 y.o. year old female has a past medical history of memory disorder here to follow-up.She was 25 minutes late for her appointment. The patient is a current patient of Dr. Benson Norway  who is out of the office today . This note is sent to the work in doctor.     Continue Namenda and Aricept at current doses will refill Follow-up in 6 months, next with Dr..Willis Exercise daily by walking Stay well hydrated  Dennie Bible, [facility], [facility], APRN  [facility] [address], [place], [place] [address] [phone]

## 2016-05-13 ENCOUNTER — Telehealth: Payer: Self-pay | Admitting: Internal Medicine

## 2016-05-13 DIAGNOSIS — I5032 Chronic diastolic (congestive) heart failure: Secondary | ICD-10-CM

## 2016-05-13 NOTE — Telephone Encounter (Signed)
New message     The daughter was returning the phone call regarding the pt's lab work

## 2016-05-13 NOTE — Telephone Encounter (Signed)
Spoke with patient's daughter. She will bring patient Thursday for BMET and BNP She has concerns that patient may need to see a kidney doctor. Advised that Dr. Harrington Challenger has not recommended that, and to continue same medications and same amount of fluid intake until after labs on Thursday. She verbalizes understanding and agreement with this plan.    Notes Recorded by Nuala Alpha, LPN on [id number] at [id number] AM Left a message for the pt to call back to endorse lab results and recommendations per Dr Harrington Challenger. Notes Recorded by Fay Records, MD on 05/11/2016 at 11:19 AM Creatinine is slight ly higher  Would keep on same regimen Keep fluid intake the same  CHeck by end of week (BMET an BNP

## 2016-05-14 NOTE — Telephone Encounter (Signed)
When I spoke to patient's daughter yesterday, she asked if patient should see a kidney doctor.  She had asked Dr. Harrington Challenger about this in the past and Dr. Harrington Challenger told her it was not necessary.  I advised her yesterday that the patient does not need to see a kidney doctor for her creatinine of 1.59, even though it is a slight bump up. But this is why Dr. Harrington Challenger has planned to repeat her labs this week.  Pt is coming tomorrow, Thursday, for repeat BMET and BNP.  I spoke with her this evening. She is focused on her mom going into kidney failure and feels that her ongoing nausea is due to this.  I suggested several times that she contact primary care for her mother's nausea which she states is getting worse.    Advised to bring patient for labs tomorrow. Advised to call primary care in the am for a possible same day appointment since her nausea is worse. (Pt saw geriatric medicine in Feb according to note was to f/u in 4 weeks)  Daughter, Tyrone Nine understanding and agreement.

## 2016-05-14 NOTE — Progress Notes (Signed)
I reviewed note and agree with plan.   Penni Bombard, MD [id number], [id number] PM Certified in Neurology, Neurophysiology and Neuroimaging  [facility] [address], [place], [place] [address] [phone]

## 2016-05-14 NOTE — Telephone Encounter (Signed)
Virginia [name] ( daughter) is calling because [name] has major nausea today . Is wanting to go ahead and schedule an appt for a Kidney Specialist . Please Call

## 2016-05-15 ENCOUNTER — Other Ambulatory Visit (INDEPENDENT_AMBULATORY_CARE_PROVIDER_SITE_OTHER): Payer: Medicare Other | Admitting: *Deleted

## 2016-05-15 DIAGNOSIS — I509 Heart failure, unspecified: Secondary | ICD-10-CM | POA: Diagnosis not present

## 2016-05-15 DIAGNOSIS — I5032 Chronic diastolic (congestive) heart failure: Secondary | ICD-10-CM

## 2016-05-15 NOTE — Addendum Note (Signed)
Addended by: Eulis Foster on: 05/15/2016 03:23 PM   Modules accepted: Orders

## 2016-05-16 ENCOUNTER — Ambulatory Visit (INDEPENDENT_AMBULATORY_CARE_PROVIDER_SITE_OTHER): Payer: Medicare Other | Admitting: Internal Medicine

## 2016-05-16 ENCOUNTER — Encounter: Payer: Self-pay | Admitting: Internal Medicine

## 2016-05-16 VITALS — BP 108/60 | HR 63 | Temp 98.0°F | Ht 63.0 in | Wt 185.2 lb

## 2016-05-16 DIAGNOSIS — N183 Chronic kidney disease, stage 3 (moderate): Secondary | ICD-10-CM | POA: Diagnosis not present

## 2016-05-16 DIAGNOSIS — R110 Nausea: Secondary | ICD-10-CM | POA: Diagnosis not present

## 2016-05-16 DIAGNOSIS — J449 Chronic obstructive pulmonary disease, unspecified: Secondary | ICD-10-CM | POA: Diagnosis not present

## 2016-05-16 DIAGNOSIS — K219 Gastro-esophageal reflux disease without esophagitis: Secondary | ICD-10-CM

## 2016-05-16 DIAGNOSIS — R413 Other amnesia: Secondary | ICD-10-CM

## 2016-05-16 LAB — BASIC METABOLIC PANEL
BUN: 28 mg/dL — AB (ref 7–25)
CHLORIDE: 101 mmol/L (ref 98–110)
CO2: 29 mmol/L (ref 20–31)
CREATININE: 1.6 mg/dL — AB (ref 0.60–0.93)
Calcium: 9.6 mg/dL (ref 8.6–10.4)
GLUCOSE: 95 mg/dL (ref 65–99)
Potassium: 4 mmol/L (ref 3.5–5.3)
SODIUM: 142 mmol/L (ref 135–146)

## 2016-05-16 LAB — BRAIN NATRIURETIC PEPTIDE: BRAIN NATRIURETIC PEPTIDE: 125.2 pg/mL — AB (ref <100)

## 2016-05-16 MED ORDER — TETANUS-DIPHTH-ACELL PERTUSSIS 5-2.5-18.5 LF-MCG/0.5 IM SUSP: 0.5000 mL | Freq: Once | INTRAMUSCULAR | Status: DC

## 2016-05-16 MED ORDER — RANITIDINE HCL 150 MG PO TABS: 150.0000 mg | ORAL_TABLET | Freq: Every day | ORAL | Status: DC

## 2016-05-16 NOTE — Progress Notes (Signed)
Patient ID: Virginia [name], female   DOB: [birth year], 80 y.o.   MRN: [id number]    Location:  [facility] Place of Service: OFFICE  Chief Complaint  Patient presents with  . Referral    kidney referral, patient complains of having nausea, no vomiting, fatigue, short of breath    HPI:  80 yo female seen today for acute visit. Daughter reports nausea, fatigue and SOB worsening last several days. She spends a lot of time in bed. She saw cardio Dr Harrington Challenger earlier this month for fatigue. BMP revealed worsening renal fxn as high as 1.6. She is followed by neurology for dementia and takes aricept and namenda >1 yr. Daughter has not noticed any improvement. No other concerns. Appetite excellent. No falls. No change in urinary habits. She is followed by pulmonary Dr Melvyn Novas and dx with COPD GOLD IV. She is supposed to use Breo but unsure if shehas been getting it. She lives with her brother. She is a poor historian due to dementia. Hx obtained from daughter.  Chart reviewed  Past Medical History  Diagnosis Date  . Hyperlipidemia   . Hypertension   . Carotid artery occlusion   . Fall at home Sept. 2013  . Memory disorder 10/24/2014    Past Surgical History  Procedure Laterality Date  . Carotid endarterectomy      left CEA  . Tonsillectomy    . Appendectomy    . Breast reduction surgery    . Skin cancer removal    . Cataract extraction Bilateral   . Colonoscopy  2015  . Eye surgery      cataracts removed, bilaterally     Patient Care Team: Lauree Chandler, NP as PCP - General (Nurse Practitioner) Fay Records, MD as Consulting Physician (Cardiology) Tanda Rockers, MD as Consulting Physician (Pulmonary Disease)  Social History   Social History  . Marital Status: Divorced    Spouse Name: N/A  . Number of Children: 2  . Years of Education: 7   Occupational History  . retired    Social History Main Topics  . Smoking status: Former Smoker -- 14 years    Types: Cigarettes    Quit date:  11/30/1994  . Smokeless tobacco: Never Used     Comment: very light smoker, not even every day   . Alcohol Use: No  . Drug Use: No  . Sexual Activity: No   Other Topics Concern  . Not on file   Social History Narrative   Diet:       Do you drink/eat things with caffeine: yes      Marital Status: Divorced   What Year Married:1959      Do you live in a house, apartment, Assisted Living, Condo, trailer? House      Is it one or more stories? 2 stories      How many persons live in your home? Just Patient      Do you have any pets in your home? 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 Very Little   Type and how often? Walk      Do you have a living will? None   DNR?   Discuss one? No      Do you have signed POA/HPOA forms?      Patient drinks 1 cup of caffeine daily.   Patient is right handed.     reports that she  quit smoking about 21 years ago. Her smoking use included Cigarettes. She quit after 14 years of use. She has never used smokeless tobacco. She reports that she does not drink alcohol or use illicit drugs.  Family History  Problem Relation Age of Onset  . Heart disease Father     Before age 50  . Hypertension Father   . Heart attack Father   . Cancer Mother 60    Brain  . Dementia Brother   . Heart failure Maternal Grandmother   . Diabetes Maternal Grandmother   . Stroke Neg Hx   . Cancer Maternal Aunt 61    breast cancer  . Diabetes Maternal Aunt    Family Status  Relation Status Death Age  . Father Deceased 25    MVA  . Mother Deceased 76  . Brother Alive   . Maternal Aunt Alive   . Brother Alive   . Sister Alive   . Daughter Alive   . Son Alive      Allergies  Allergen Reactions  . Lidocaine Anaphylaxis, Swelling, Rash and Other (See Comments)    Any of the " [place] "    Medications: Patient's Medications  New Prescriptions   No medications on file  Previous Medications   ARTIFICIAL TEAR  OP    Place 2 drops into both eyes daily.    ASPIRIN 81 MG TABLET    Take 81 mg by mouth daily.   ATORVASTATIN (LIPITOR) 20 MG TABLET    Take 1 tablet (20 mg total) by mouth daily.   B COMPLEX VITAMINS TABLET    Take 1 tablet by mouth daily.   BIOTIN 5000 MCG TABS    Take 1 tablet by mouth daily.   BYSTOLIC 5 MG TABLET    take 1 tablet by mouth once daily   CHOLECALCIFEROL (VITAMIN D) 2000 UNITS CAPS    Take 1 capsule by mouth daily.   CO-ENZYME Q-10 50 MG CAPSULE    Take 100 mg by mouth daily.    DICLOFENAC SODIUM (VOLTAREN) 1 % GEL    Apply 2 g topically 4 (four) times daily as needed.   DONEPEZIL (ARICEPT) 10 MG TABLET    Take 1 tablet (10 mg total) by mouth at bedtime.   FLUTICASONE FUROATE-VILANTEROL (BREO ELLIPTA) 100-25 MCG/INH AEPB    Inhale 1 puff into the lungs daily.   FUROSEMIDE (LASIX) 20 MG TABLET    Take by mouth. Take 40 mg every other day, on the other days take 60 mg   IPRATROPIUM (ATROVENT) 0.03 % NASAL SPRAY    Place 2 sprays into both nostrils 4 (four) times daily. Reported on 05/12/2016   IPRATROPIUM (ATROVENT) 0.06 % NASAL SPRAY    Place 2 sprays into both nostrils 2 (two) times daily.    LOSARTAN (COZAAR) 50 MG TABLET    Take one tablet by mouth once daily for blood pressure   MEMANTINE (NAMENDA) 10 MG TABLET    Take 1 tablet (10 mg total) by mouth 2 (two) times daily.   MULTIPLE VITAMINS-MINERALS (ALIVE WOMENS 50+ PO)    Take 1 tablet by mouth daily. (ALIVE WOMENS 50+)   MULTIPLE VITAMINS-MINERALS (PRESERVISION AREDS 2) CAPS    Take 1 capsule by mouth daily.   PANTOPRAZOLE (PROTONIX) 40 MG TABLET    Take 1 tablet (40 mg total) by mouth daily.   VAYACOG 100-19.5-6.5 MG CAPS    take 1 capsule by mouth once daily  Modified Medications  Modified Medication Previous Medication   TDAP (BOOSTRIX) 5-2.5-18.5 LF-MCG/0.5 INJECTION Tdap (BOOSTRIX) 5-2.5-18.5 LF-MCG/0.5 injection      Inject 0.5 mLs into the muscle once.    Inject 0.5 mLs into the muscle once.  Discontinued  Medications   FUROSEMIDE (LASIX) 20 MG TABLET    Take 1 tablet (20 mg total) and 1 tablet (40 mg total) together  to equal 60 mg total by mouth  every other day.   FUROSEMIDE (LASIX) 40 MG TABLET    Take 1 tablet (40 mg total) and 1 tablet (20 mg total) together to equal 60 mg total every other day.   SACCHAROMYCES BOULARDII (FLORASTOR) 250 MG CAPSULE    Take 250 mg by mouth 2 (two) times a week. Reported on 05/16/2016    Review of Systems  Unable to perform ROS: Dementia    Filed Vitals:   05/16/16 1539  Pulse: 63  Temp: 98 F (36.7 C)  TempSrc: Oral  Height: 5\' 3"  (1.6 m)  Weight: 185 lb 3.2 oz (84.006 kg)  SpO2: 92%   Body mass index is 32.81 kg/(m^2).  Physical Exam  Constitutional: She is oriented to person, place, and time. She appears well-developed and well-nourished.  HENT:  Mouth/Throat: Oropharynx is clear and moist. No oropharyngeal exudate.  Eyes: Pupils are equal, round, and reactive to light. No scleral icterus.  Neck: Neck supple. Carotid bruit is not present. No tracheal deviation present. No thyromegaly present.  Cardiovascular: Normal rate, regular rhythm, normal heart sounds and intact distal pulses.  Exam reveals no gallop and no friction rub.   No murmur heard. No LE edema b/l. no calf TTP.   Pulmonary/Chest: Effort normal and breath sounds normal. No stridor. No respiratory distress. She has no wheezes. She has no rales.  Abdominal: Soft. Bowel sounds are normal. She exhibits no distension and no mass. There is no hepatomegaly. There is tenderness (epigastric ). There is no rebound and no guarding.  Lymphadenopathy:    She has no cervical adenopathy.  Neurological: She is alert and oriented to person, place, and time. She has normal reflexes.  Skin: Skin is warm and dry. No rash noted.  Psychiatric: She has a normal mood and affect. Her behavior is normal. Judgment and thought content normal.     Labs reviewed: Lab on 05/15/2016  Component Date Value  Ref Range Status  . Brain Natriuretic Peptide 05/15/2016 125.2* <100 pg/mL Final   Comment:   BNP levels increase with age in the general population with the highest values seen in individuals greater than 68 years of age. Reference: Joellyn Rued Cardiol 2002; [id number].     . Sodium 05/15/2016 142  135 - 146 mmol/L Final  . Potassium 05/15/2016 4.0  3.5 - 5.3 mmol/L Final  . Chloride 05/15/2016 101  98 - 110 mmol/L Final  . CO2 05/15/2016 29  20 - 31 mmol/L Final  . Glucose, Bld 05/15/2016 95  65 - 99 mg/dL Final  . BUN 05/15/2016 28* 7 - 25 mg/dL Final  . Creat 05/15/2016 1.60* 0.60 - 0.93 mg/dL Final   Comment:   For patients > or = 78 years of age: The upper reference limit for Creatinine is approximately 13% higher for people identified as African-American.     . Calcium 05/15/2016 9.6  8.6 - 10.4 mg/dL Final  Office Visit on 05/09/2016  Component Date Value Ref Range Status  . Sodium 05/09/2016 143  135 - 146 mmol/L Final  . Potassium 05/09/2016 4.4  3.5 - 5.3 mmol/L Final  . Chloride 05/09/2016 102  98 - 110 mmol/L Final  . CO2 05/09/2016 32* 20 - 31 mmol/L Final  . Glucose, Bld 05/09/2016 103* 65 - 99 mg/dL Final  . BUN 05/09/2016 23  7 - 25 mg/dL Final  . Creat 05/09/2016 1.59* 0.60 - 0.93 mg/dL Final  . Calcium 05/09/2016 9.1  8.6 - 10.4 mg/dL Final  Lab on 02/25/2016  Component Date Value Ref Range Status  . Sodium 02/25/2016 143  135 - 146 mmol/L Final  . Potassium 02/25/2016 4.2  3.5 - 5.3 mmol/L Final  . Chloride 02/25/2016 103  98 - 110 mmol/L Final  . CO2 02/25/2016 25  20 - 31 mmol/L Final  . Glucose, Bld 02/25/2016 94  65 - 99 mg/dL Final  . BUN 02/25/2016 22  7 - 25 mg/dL Final  . Creat 02/25/2016 1.33* 0.60 - 0.93 mg/dL Final  . Calcium 02/25/2016 8.6  8.6 - 10.4 mg/dL Final  . Brain Natriuretic Peptide 02/25/2016 152.3* <100 pg/mL Final   Comment:   BNP levels increase with age in the general population with the highest values seen in individuals greater  than 65 years of age. Reference: Joellyn Rued Cardiol 2002; [id number].       No results found.   Assessment/Plan   ICD-9-CM ICD-10-CM   1. Nausea - worsening 787.02 R11.0   2. Gastroesophageal reflux disease without esophagitis 530.81 K21.9 ranitidine (ZANTAC) 150 MG tablet  3. Memory disorder - failing to change as expected 780.93 R41.3   4. COPD GOLD IV - uncontrolled 496 J44.9   5. CKD (chronic kidney disease), stage 3 (moderate) - worsening 585.3 N18.3     Resume Breo inhaler as ordered  HOLD aricept  X 1 week to see if nausea improves  May need neurology +/- GI follow up  T/c nephro eval if renal fxn does not improve. Lasix reduced by cardio yesterday. To have repeat BMP in 1 week  Follow up in 4 weeks with Sherrie Mustache, NP   Cordella Register. Perlie Gold  [facility] [address] [place], [place] [address] [phone] Cell (Monday-Friday 8 AM - 5 PM) [phone] After 5 PM and follow prompts

## 2016-05-16 NOTE — Patient Instructions (Addendum)
Resume Breo inhaler as ordered  HOLD aricept  X 1 week to see if nausea improves  May need neurology +/- GI follow up  Follow up in 4 weeks with Sherrie Mustache, NP

## 2016-05-19 ENCOUNTER — Other Ambulatory Visit: Payer: Self-pay | Admitting: *Deleted

## 2016-05-19 DIAGNOSIS — K219 Gastro-esophageal reflux disease without esophagitis: Secondary | ICD-10-CM

## 2016-05-19 MED ORDER — LOSARTAN POTASSIUM 50 MG PO TABS: ORAL_TABLET | ORAL | Status: DC

## 2016-05-19 MED ORDER — PANTOPRAZOLE SODIUM 40 MG PO TBEC: 40.0000 mg | DELAYED_RELEASE_TABLET | Freq: Every day | ORAL | Status: DC

## 2016-05-19 NOTE — Telephone Encounter (Signed)
[facility] 

## 2016-05-20 ENCOUNTER — Telehealth: Payer: Self-pay | Admitting: *Deleted

## 2016-05-20 ENCOUNTER — Telehealth: Payer: Self-pay | Admitting: Internal Medicine

## 2016-05-20 DIAGNOSIS — I5031 Acute diastolic (congestive) heart failure: Secondary | ICD-10-CM

## 2016-05-20 DIAGNOSIS — R110 Nausea: Secondary | ICD-10-CM

## 2016-05-20 NOTE — Telephone Encounter (Signed)
Virginia [name], daughter notified and agreed and will call back with the name of GI Dr they want to see.

## 2016-05-20 NOTE — Telephone Encounter (Signed)
We are waiting to see if her kidney fxn improves with change in lasix by cardiology before deciding to refer her to nephrology. Has she had repeat lab yet?  Recommend referral to GI for nausea. Continue protonix as ordered  Follow up with Virginia [name] as scheduled next month. Continue other medications as ordered

## 2016-05-20 NOTE — Telephone Encounter (Signed)
Tried to leave message, mailbox is full and cannot leave message.

## 2016-05-20 NOTE — Telephone Encounter (Signed)
F/u  Pt dtr returning Rn phone call- Please call back and discuss.

## 2016-05-20 NOTE — Telephone Encounter (Signed)
Spoke with patient's daughter. She was given the instructions already to hold lasix x 1 day then restart at 40 mg daily Appointment made for BMET this Friday.

## 2016-05-20 NOTE — Telephone Encounter (Signed)
-----   Message from Fay Records, MD sent at 05/16/2016  3:07 PM EDT ----- I would recomtm pt back down on lasix  HOld Lasix for 1 day then take 40 mg every day  Dont take 60 mg F?U BMET in 1 wk   May be a little dry, I do not think changes are permanent

## 2016-05-20 NOTE — Telephone Encounter (Signed)
Daughter called back and stated that they want to use Dr. Zenovia Jarred. Referral placed.

## 2016-05-20 NOTE — Telephone Encounter (Signed)
Patient daughter, [name] called and stated that they saw you last Friday. Patient was having nausea. You told her to stop the Aricept for 1 week to see if the nausea improved and it hasn't. She stated that the nausea has continued and the memory has rapidly decreased. She started the Aricept back today.  Also they are concerned with the stage 3 Kidney Diagnosis and wants to know why they can't be referred to a nephrologist. Please Advise.  They have an appointment with Janett Billow on 06/12/16.

## 2016-05-22 ENCOUNTER — Encounter: Payer: Self-pay | Admitting: Internal Medicine

## 2016-05-22 ENCOUNTER — Telehealth: Payer: Self-pay | Admitting: *Deleted

## 2016-05-22 NOTE — Telephone Encounter (Signed)
Patient daughter notified and appointment scheduled for tomorrow to be evaluated.

## 2016-05-22 NOTE — Telephone Encounter (Signed)
She needs to be seen. Recommend UC if no available appts. She can also contact dr Gustavus Bryant office to see if they have any availalble appts. Go to the ER if sx's worsen

## 2016-05-22 NOTE — Telephone Encounter (Signed)
Virginia [name],daughter called and stated that patient was sick and not going to get her labs done tomorrow, going to go Monday,  and wanted to let you know. Also stated that she feels like her mother sickness is turning into pneumonia due to her history of it. Daughter is giving Mucinex 1/2 tablet every 2 hours and pushing fluids. Not sure if she is running fever, no thermometer. Has chest and nasal congestion. Family has been sick and worried with the weekend coming up.  Please Advise.

## 2016-05-23 ENCOUNTER — Encounter: Payer: Self-pay | Admitting: Internal Medicine

## 2016-05-23 ENCOUNTER — Telehealth: Payer: Self-pay

## 2016-05-23 ENCOUNTER — Ambulatory Visit (INDEPENDENT_AMBULATORY_CARE_PROVIDER_SITE_OTHER): Payer: Medicare Other | Admitting: Internal Medicine

## 2016-05-23 ENCOUNTER — Ambulatory Visit
Admission: RE | Admit: 2016-05-23 | Discharge: 2016-05-23 | Disposition: A | Discharge status: Home / Self Care | Payer: Medicare Other | Source: Ambulatory Visit | Attending: Internal Medicine | Admitting: Internal Medicine

## 2016-05-23 VITALS — BP 142/78 | HR 80 | Temp 98.2°F

## 2016-05-23 DIAGNOSIS — I5032 Chronic diastolic (congestive) heart failure: Secondary | ICD-10-CM | POA: Diagnosis not present

## 2016-05-23 DIAGNOSIS — R110 Nausea: Secondary | ICD-10-CM

## 2016-05-23 DIAGNOSIS — J302 Other seasonal allergic rhinitis: Secondary | ICD-10-CM

## 2016-05-23 DIAGNOSIS — J441 Chronic obstructive pulmonary disease with (acute) exacerbation: Secondary | ICD-10-CM | POA: Diagnosis not present

## 2016-05-23 DIAGNOSIS — R05 Cough: Secondary | ICD-10-CM | POA: Diagnosis not present

## 2016-05-23 DIAGNOSIS — R413 Other amnesia: Secondary | ICD-10-CM

## 2016-05-23 MED ORDER — IPRATROPIUM-ALBUTEROL 0.5-2.5 (3) MG/3ML IN SOLN
3.0000 mL | Freq: Four times a day (QID) | RESPIRATORY_TRACT | Status: DC
  Administered 2016-05-23: 3 mL via RESPIRATORY_TRACT

## 2016-05-23 MED ORDER — PREDNISONE 10 MG PO TABS: ORAL_TABLET | ORAL | Status: DC

## 2016-05-23 MED ORDER — LEVOFLOXACIN 500 MG PO TABS: 500.0000 mg | ORAL_TABLET | Freq: Every day | ORAL | Status: DC

## 2016-05-23 MED ORDER — TETANUS-DIPHTH-ACELL PERTUSSIS 5-2.5-18.5 LF-MCG/0.5 IM SUSP: 0.5000 mL | Freq: Once | INTRAMUSCULAR | Status: DC

## 2016-05-23 MED ORDER — ALBUTEROL SULFATE HFA 108 (90 BASE) MCG/ACT IN AERS: INHALATION_SPRAY | RESPIRATORY_TRACT | Status: DC

## 2016-05-23 NOTE — Patient Instructions (Addendum)
Will call with xray results  Take all of antibiotic as ordered  Push fluids and rest  Recommend OTC probiotic (activia, culturelle, yogurt) daily while on antibiotic  Will call with GI referral  Take plain claritin OTC daily  Follow up as scheduled or sooner if symptoms do not improve or worsen

## 2016-05-23 NOTE — Progress Notes (Signed)
Patient ID: Virginia [name], female   DOB: 09-18-1936, 80 y.o.   MRN: [id number]    Location:  [facility] Place of Service: OFFICE  Chief Complaint  Patient presents with  . Cough    cough started on Wednesday, its a productive cough, no fever, Here with older brother     HPI:  80 yo female seen today for cough and congestion x 2 days. Cough interrupts sleep and associated with cloudy sputum. She reports no CP, dizziness, f/c, ear pain/pressure. She has (+) SOB, HA, post nasal drip, sinus/nasal congestion, sick contacts with similar sx's. She has reduced appetite. Nausea did not improve with holding aricept and behavior actually got worse so family resumed it.  She was seen last week due to c/a nausea, fatigue and SOB worsening last several days. She spends a lot of time in bed. She saw cardio Dr Harrington Challenger earlier this month for fatigue. BMP revealed worsening renal fxn as high as 1.6. She is followed by neurology for dementia and takes aricept and namenda >1 yr. Daughter has not noticed any improvement. No other concerns. Appetite excellent. No falls. No change in urinary habits. She is followed by pulmonary Dr Melvyn Novas and dx with COPD GOLD IV. She is [name] as ordered   She lives with her brother. She is a poor historian due to dementia. Hx obtained from brother.    Past Medical History  Diagnosis Date  . Hyperlipidemia   . Hypertension   . Carotid artery occlusion   . Fall at home Sept. 2013  . Memory disorder 10/24/2014    Past Surgical History  Procedure Laterality Date  . Carotid endarterectomy      left CEA  . Tonsillectomy    . Appendectomy    . Breast reduction surgery    . Skin cancer removal    . Cataract extraction Bilateral   . Colonoscopy  2015  . Eye surgery      cataracts removed, bilaterally     Patient Care Team: Lauree Chandler, NP as PCP - General (Nurse Practitioner) Fay Records, MD as Consulting Physician (Cardiology) Tanda Rockers, MD as Consulting Physician  (Pulmonary Disease)  Social History   Social History  . Marital Status: Divorced    Spouse Name: N/A  . Number of Children: 2  . Years of Education: 7   Occupational History  . retired    Social History Main Topics  . Smoking status: Former Smoker -- 14 years    Types: Cigarettes    Quit date: 11/30/1994  . Smokeless tobacco: Never Used     Comment: very light smoker, not even every day   . Alcohol Use: No  . Drug Use: No  . Sexual Activity: No   Other Topics Concern  . Not on file   Social History Narrative   Diet:       Do you drink/eat things with caffeine: yes      Marital Status: Divorced   What Year Married:1959      Do you live in a house, apartment, Assisted Living, Condo, trailer? House      Is it one or more stories? 2 stories      How many persons live in your home? Just Patient      Do you have any pets in your home? 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 Very Little   Type and how  often? Walk      Do you have a living will? None   DNR?   Discuss one? No      Do you have signed POA/HPOA forms?      Patient drinks 1 cup of caffeine daily.   Patient is right handed.     reports that she quit smoking about 21 years ago. Her smoking use included Cigarettes. She quit after 14 years of use. She has never used smokeless tobacco. She reports that she does not drink alcohol or use illicit drugs.  Family History  Problem Relation Age of Onset  . Heart disease Father     Before age 8  . Hypertension Father   . Heart attack Father   . Cancer Mother 35    Brain  . Dementia Brother   . Heart failure Maternal Grandmother   . Diabetes Maternal Grandmother   . Stroke Neg Hx   . Cancer Maternal Aunt 61    breast cancer  . Diabetes Maternal Aunt    Family Status  Relation Status Death Age  . Father Deceased 67    MVA  . Mother Deceased 75  . Brother Alive   . Maternal Aunt Alive   . Brother Alive   . Sister  Alive   . Daughter Alive   . Son Alive      Allergies  Allergen Reactions  . Lidocaine Anaphylaxis, Swelling, Rash and Other (See Comments)    Any of the " [place] "    Medications: Patient's Medications  New Prescriptions   No medications on file  Previous Medications   ARTIFICIAL TEAR OP    Place 2 drops into both eyes daily.    ASPIRIN 81 MG TABLET    Take 81 mg by mouth daily.   ATORVASTATIN (LIPITOR) 20 MG TABLET    Take 1 tablet (20 mg total) by mouth daily.   B COMPLEX VITAMINS TABLET    Take 1 tablet by mouth daily.   BIOTIN 5000 MCG TABS    Take 1 tablet by mouth daily.   BYSTOLIC 5 MG TABLET    take 1 tablet by mouth once daily   CHOLECALCIFEROL (VITAMIN D) 2000 UNITS CAPS    Take 1 capsule by mouth daily.   CO-ENZYME Q-10 50 MG CAPSULE    Take 100 mg by mouth daily.    DICLOFENAC SODIUM (VOLTAREN) 1 % GEL    Apply 2 g topically 4 (four) times daily as needed.   DONEPEZIL (ARICEPT) 10 MG TABLET    Take 1 tablet (10 mg total) by mouth at bedtime.   FLUTICASONE FUROATE-VILANTEROL (BREO ELLIPTA) 100-25 MCG/INH AEPB    Inhale 1 puff into the lungs daily.   FUROSEMIDE (LASIX) 20 MG TABLET    Take 40 mg by mouth daily.   IPRATROPIUM (ATROVENT) 0.03 % NASAL SPRAY    Place 2 sprays into both nostrils 4 (four) times daily. Reported on 05/12/2016   IPRATROPIUM (ATROVENT) 0.06 % NASAL SPRAY    Place 2 sprays into both nostrils 2 (two) times daily.    LOSARTAN (COZAAR) 50 MG TABLET    Take one tablet by mouth once daily for blood pressure   MEMANTINE (NAMENDA) 10 MG TABLET    Take 1 tablet (10 mg total) by mouth 2 (two) times daily.   MULTIPLE VITAMINS-MINERALS (ALIVE WOMENS 50+ PO)    Take 1 tablet by mouth daily. (ALIVE WOMENS 50+)   MULTIPLE VITAMINS-MINERALS (PRESERVISION AREDS 2) CAPS  Take 1 capsule by mouth daily.   PANTOPRAZOLE (PROTONIX) 40 MG TABLET    Take 1 tablet (40 mg total) by mouth daily.   RANITIDINE (ZANTAC) 150 MG TABLET    Take 1 tablet (150 mg total) by  mouth at bedtime.   VAYACOG 100-19.5-6.5 MG CAPS    take 1 capsule by mouth once daily  Modified Medications   Modified Medication Previous Medication   TDAP (BOOSTRIX) 5-2.5-18.5 LF-MCG/0.5 INJECTION Tdap (BOOSTRIX) 5-2.5-18.5 LF-MCG/0.5 injection      Inject 0.5 mLs into the muscle once.    Inject 0.5 mLs into the muscle once.  Discontinued Medications   No medications on file    Review of Systems  Constitutional: Positive for appetite change.  HENT: Positive for postnasal drip, rhinorrhea and sinus pressure.   Respiratory: Positive for cough and shortness of breath.   Gastrointestinal: Positive for nausea.  Neurological: Positive for headaches.  Psychiatric/Behavioral: Positive for sleep disturbance.  All other systems reviewed and are negative.   Filed Vitals:   05/23/16 1336  BP: 142/78  Pulse: 80  Temp: 98.2 F (36.8 C)  TempSrc: Oral  SpO2: 93%   There is no weight on file to calculate BMI.  Physical Exam  Constitutional: She is oriented to person, place, and time. She appears well-developed and well-nourished.  HENT:  Mouth/Throat: Oropharynx is clear and moist. No oropharyngeal exudate.  TMs appear nml; left maxillary sinus TTP; nares with enlarged grey dry turbinates; oropharynx cobblestoning but no redness or exudate  Eyes: Pupils are equal, round, and reactive to light. No scleral icterus.  Neck: Neck supple. Carotid bruit is not present. No tracheal deviation present. No thyromegaly present.  Cardiovascular: Regular rhythm and intact distal pulses.  Tachycardia present.  Exam reveals no gallop and no friction rub.   Murmur heard.  Systolic murmur is present with a grade of 1/6  No LE edema b/l. no calf TTP.   Pulmonary/Chest: Effort normal. No stridor. No respiratory distress. She has decreased breath sounds (right base). She has wheezes (left basilar end expiratory). She has no rhonchi. She has no rales.  Abdominal: Soft. Bowel sounds are normal. She exhibits  no distension and no mass. There is no hepatomegaly. There is tenderness (epigastric ) in the left lower quadrant. There is no rebound and no guarding.  Musculoskeletal: She exhibits edema.  Lymphadenopathy:    She has no cervical adenopathy.  Neurological: She is alert and oriented to person, place, and time. She has normal reflexes.  Skin: Skin is warm and dry. No rash noted.  Psychiatric: She has a normal mood and affect. Her behavior is normal.     Labs reviewed: Lab on 05/15/2016  Component Date Value Ref Range Status  . Brain Natriuretic Peptide 05/15/2016 125.2* <100 pg/mL Final   Comment:   BNP levels increase with age in the general population with the highest values seen in individuals greater than 1 years of age. Reference: Joellyn Rued Cardiol 2002; [id number].     . Sodium 05/15/2016 142  135 - 146 mmol/L Final  . Potassium 05/15/2016 4.0  3.5 - 5.3 mmol/L Final  . Chloride 05/15/2016 101  98 - 110 mmol/L Final  . CO2 05/15/2016 29  20 - 31 mmol/L Final  . Glucose, Bld 05/15/2016 95  65 - 99 mg/dL Final  . BUN 05/15/2016 28* 7 - 25 mg/dL Final  . Creat 05/15/2016 1.60* 0.60 - 0.93 mg/dL Final   Comment:   For patients > or =  78 years of age: The upper reference limit for Creatinine is approximately 13% higher for people identified as African-American.     . Calcium 05/15/2016 9.6  8.6 - 10.4 mg/dL Final  Office Visit on 05/09/2016  Component Date Value Ref Range Status  . Sodium 05/09/2016 143  135 - 146 mmol/L Final  . Potassium 05/09/2016 4.4  3.5 - 5.3 mmol/L Final  . Chloride 05/09/2016 102  98 - 110 mmol/L Final  . CO2 05/09/2016 32* 20 - 31 mmol/L Final  . Glucose, Bld 05/09/2016 103* 65 - 99 mg/dL Final  . BUN 05/09/2016 23  7 - 25 mg/dL Final  . Creat 05/09/2016 1.59* 0.60 - 0.93 mg/dL Final  . Calcium 05/09/2016 9.1  8.6 - 10.4 mg/dL Final  Lab on 02/25/2016  Component Date Value Ref Range Status  . Sodium 02/25/2016 143  135 - 146 mmol/L Final  .  Potassium 02/25/2016 4.2  3.5 - 5.3 mmol/L Final  . Chloride 02/25/2016 103  98 - 110 mmol/L Final  . CO2 02/25/2016 25  20 - 31 mmol/L Final  . Glucose, Bld 02/25/2016 94  65 - 99 mg/dL Final  . BUN 02/25/2016 22  7 - 25 mg/dL Final  . Creat 02/25/2016 1.33* 0.60 - 0.93 mg/dL Final  . Calcium 02/25/2016 8.6  8.6 - 10.4 mg/dL Final  . Brain Natriuretic Peptide 02/25/2016 152.3* <100 pg/mL Final   Comment:   BNP levels increase with age in the general population with the highest values seen in individuals greater than 48 years of age. Reference: Joellyn Rued Cardiol 2002; [id number].       No results found.   Assessment/Plan   ICD-9-CM ICD-10-CM   1. COPD exacerbation (HCC) 491.21 J44.1 levofloxacin (LEVAQUIN) 500 MG tablet     predniSONE (DELTASONE) 10 MG tablet     albuterol (PROVENTIL HFA;VENTOLIN HFA) 108 (90 Base) MCG/ACT inhaler     DG Chest 2 View     ipratropium-albuterol (DUONEB) 0.5-2.5 (3) MG/3ML nebulizer solution 3 mL  2. Chronic diastolic congestive heart failure (HCC) 428.32 I50.32 DG Chest 2 View   428.0    3. Memory disorder 780.93 R41.3   4. Nausea without vomiting 787.02 R11.0   5. Other seasonal allergic rhinitis 477.8 J30.2    Will call with xray results  Take all of antibiotic as ordered  Push fluids and rest  Recommend OTC probiotic (activia, culturelle, yogurt) daily while on antibiotic  Will call with GI referral  Take plain claritin OTC daily  Follow up as scheduled or sooner if symptoms do not improve or worsen   Gelene Recktenwald S. Perlie Gold  [facility] [address] [place], [place] [address] [phone] Cell (Monday-Friday 8 AM - 5 PM) [phone] After 5 PM and follow prompts

## 2016-05-23 NOTE — Telephone Encounter (Signed)
Spoke with patients daughter informed her of med's that was sent in to pharmacy for today's visit. She expressed understanding

## 2016-05-26 ENCOUNTER — Other Ambulatory Visit: Payer: Self-pay | Admitting: Internal Medicine

## 2016-05-28 ENCOUNTER — Other Ambulatory Visit: Payer: Self-pay | Admitting: Internal Medicine

## 2016-06-02 ENCOUNTER — Telehealth: Payer: Self-pay | Admitting: Internal Medicine

## 2016-06-02 ENCOUNTER — Other Ambulatory Visit (INDEPENDENT_AMBULATORY_CARE_PROVIDER_SITE_OTHER): Payer: Medicare Other | Admitting: *Deleted

## 2016-06-02 DIAGNOSIS — I5032 Chronic diastolic (congestive) heart failure: Secondary | ICD-10-CM

## 2016-06-02 DIAGNOSIS — I5031 Acute diastolic (congestive) heart failure: Secondary | ICD-10-CM | POA: Diagnosis not present

## 2016-06-02 NOTE — Telephone Encounter (Signed)
Called patient's daughter back.  Got voice mail, which was full.  Unable to leave a message.

## 2016-06-02 NOTE — Telephone Encounter (Signed)
New message     1--can pt still come in today for labs?  She was was due to come 05-20-16. 2--talk to the nurse about mom's bp being low.  She is driving now and do not have access to the book with the readings. She will have the readings available when you call back

## 2016-06-03 LAB — BASIC METABOLIC PANEL
BUN: 32 mg/dL — ABNORMAL HIGH (ref 7–25)
CHLORIDE: 97 mmol/L — AB (ref 98–110)
CO2: 29 mmol/L (ref 20–31)
Calcium: 8.8 mg/dL (ref 8.6–10.4)
Creat: 1.64 mg/dL — ABNORMAL HIGH (ref 0.60–0.93)
GLUCOSE: 77 mg/dL (ref 65–99)
POTASSIUM: 4.1 mmol/L (ref 3.5–5.3)
SODIUM: 140 mmol/L (ref 135–146)

## 2016-06-04 ENCOUNTER — Telehealth: Payer: Self-pay | Admitting: *Deleted

## 2016-06-04 NOTE — Telephone Encounter (Signed)
Message     Labs look like she is still getting a little too much Lasix    I would recomm backing down to 40 mg every other day    Make sure she is minimizing salt intake    Check BMET and BNP in 2 wks     Called listed mobile number (which is daughter dorothy's)--unable to leave message; voice mail is still full. Called home number which just rang, no answer, no voice mail.   Reached patient's brother, Virginia [name], informed of medicine change and labs in 2 weeks.  Also discussed minimizing salt in diet.  He states he takes care of her food and he will start watching closer.

## 2016-06-04 NOTE — Telephone Encounter (Signed)
Ok to refer to nephrology for worsening renal fxn

## 2016-06-04 NOTE — Telephone Encounter (Signed)
Received a phone call from the patient's daughter regarding her mother going to see a nephrologist, she stated that she would like for her mother to see Dr. Posey Pronto or Dr. Jackquline Bosch Advise!

## 2016-06-05 ENCOUNTER — Other Ambulatory Visit: Payer: Self-pay | Admitting: *Deleted

## 2016-06-05 DIAGNOSIS — R262 Difficulty in walking, not elsewhere classified: Secondary | ICD-10-CM | POA: Diagnosis not present

## 2016-06-05 DIAGNOSIS — N289 Disorder of kidney and ureter, unspecified: Secondary | ICD-10-CM

## 2016-06-05 DIAGNOSIS — M25511 Pain in right shoulder: Secondary | ICD-10-CM | POA: Diagnosis not present

## 2016-06-06 ENCOUNTER — Telehealth: Payer: Self-pay

## 2016-06-06 NOTE — Telephone Encounter (Signed)
Last time I saw pt, she was having nausea no vomiting and no change in appetite. Both aricept and namenda can cause nausea. I recommend family considers holding both of these meds to see if nausea improves.

## 2016-06-06 NOTE — Telephone Encounter (Signed)
Patient's daughter called in tears because her mother is suffering and her GI appointment is not until 07/29/16. Patient's daughter is currently out of town for 2 weeks and would like to know if Dr.Carter will call Dr.Pyrtle's office directly to expedite referral. Patient is on a wait list with several patient's ahead of her. Patient's daughter is calling weekly to see if any cancellations.  Patient's daughter also requested lab results from 06-02-16, lab values given. Labs were ordered by another provider and not addressed yet. Patient's daughter is a Marine scientist and understood values.   Please advise

## 2016-06-09 NOTE — Telephone Encounter (Deleted)
Left message on voicemail for patient to return call when available   

## 2016-06-09 NOTE — Telephone Encounter (Signed)
Called patient's daughter. No answer, unable to leave a message (mailbox full)

## 2016-06-09 NOTE — Telephone Encounter (Signed)
Patients daughter stated that she tried stopping medication already did not help nausea and made memory worse really quickly.

## 2016-06-10 NOTE — Telephone Encounter (Signed)
Awaiting response from Dr Hilarie Fredrickson

## 2016-06-11 NOTE — Telephone Encounter (Signed)
Spoke with patient's daughter, patient is feeling slightly better. Patient is drinking a protein shake in the am.

## 2016-06-12 ENCOUNTER — Encounter: Payer: Self-pay | Admitting: Nurse Practitioner

## 2016-06-12 ENCOUNTER — Ambulatory Visit (INDEPENDENT_AMBULATORY_CARE_PROVIDER_SITE_OTHER): Payer: Medicare Other | Admitting: Nurse Practitioner

## 2016-06-12 VITALS — BP 120/72 | HR 73 | Temp 98.2°F | Resp 20 | Ht 63.0 in | Wt 190.4 lb

## 2016-06-12 DIAGNOSIS — R110 Nausea: Secondary | ICD-10-CM

## 2016-06-12 DIAGNOSIS — E785 Hyperlipidemia, unspecified: Secondary | ICD-10-CM | POA: Diagnosis not present

## 2016-06-12 DIAGNOSIS — J441 Chronic obstructive pulmonary disease with (acute) exacerbation: Secondary | ICD-10-CM | POA: Diagnosis not present

## 2016-06-12 DIAGNOSIS — R413 Other amnesia: Secondary | ICD-10-CM

## 2016-06-12 DIAGNOSIS — N183 Chronic kidney disease, stage 3 (moderate): Secondary | ICD-10-CM

## 2016-06-12 NOTE — Patient Instructions (Addendum)
STOP ALIVE, BIOTENE, CO-q 10, b complex vitamin  Take Breo DAILY  Will place referral for nephrology

## 2016-06-12 NOTE — Progress Notes (Signed)
Patient ID: Virginia [name], female   DOB: 14-Jul-1936, 80 y.o.   MRN: [id number]    PCP: Lauree Chandler, NP  Advanced Directive information Does patient have an advance directive?: Yes, Type of Advance Directive: [place];Living will;Out of facility DNR (pink MOST or yellow form), Does patient want to make changes to advanced directive?: No - Patient declined  Allergies  Allergen Reactions  . Lidocaine Anaphylaxis, Swelling, Rash and Other (See Comments)    Any of the " [place] "    Chief Complaint  Patient presents with  . Medical Management of Chronic Issues    4 week follow up for SOB, fatigue, nausea. Nausea has improved, still having SOB and fatigue.   . OTHER    Brother and Sister in room with patient.      HPI: Patient is a 80 y.o. female seen in the office today to follow up COPD exacerbation and nausea. Completed Levaquin and did prednisone taper.  Pt reports shortness of breath has improved, reports she does not really do much to become short of breath.  Brother starts breo is only been given as needed. Has not needed albuterol.  Taking lasix 40 mg by mouth every there day  Breathing is back to baseline.   2 weeks ago started with protein drink in the morning and nausea has improved. Had some this afternoon but much better. No vomiting. Able to eat without difficulty.  Moving bowels. No constipation or diarrhea.  Has cut out coffee Weight has been stable   A lot of nonprescription drugs that are overwhelming.  Does not want to take all the pills. Would like to cut back on supplements. Would tell brother frequently that she has taken enough pills for today and not wanting to tae any more.   Daughter request nephrology referral due to worsening renal function. Would like to see Dr Posey Pronto or Dr Jeannene Patella  Review of Systems:  Review of Systems  Constitutional: Negative for activity change, appetite change, fatigue and unexpected weight change.    HENT: Negative for congestion and hearing loss.   Eyes: Negative.   Respiratory: Negative for cough and shortness of breath.   Cardiovascular: Negative for chest pain, palpitations and leg swelling.  Gastrointestinal: Negative for abdominal pain, diarrhea and constipation. Nausea: improved.  Genitourinary: Negative for dysuria and difficulty urinating.  Musculoskeletal: Negative for myalgias and arthralgias.  Skin: Negative for color change and wound.  Neurological: Negative for dizziness and weakness.  Psychiatric/Behavioral: Positive for confusion (dementia). Negative for behavioral problems and agitation.    Past Medical History  Diagnosis Date  . Hyperlipidemia   . Hypertension   . Carotid artery occlusion   . Fall at home Sept. 2013  . Memory disorder 10/24/2014   Past Surgical History  Procedure Laterality Date  . Carotid endarterectomy      left CEA  . Tonsillectomy    . Appendectomy    . Breast reduction surgery    . Skin cancer removal    . Cataract extraction Bilateral   . Colonoscopy  2015  . Eye surgery      cataracts removed, bilaterally    Social History:   reports that she quit smoking about 21 years ago. Her smoking use included Cigarettes. She quit after 14 years of use. She has never used smokeless tobacco. She reports that she does not drink alcohol or use illicit drugs.  Family History  Problem Relation Age of Onset  . Heart  disease Father     Before age 58  . Hypertension Father   . Heart attack Father   . Cancer Mother 67    Brain  . Dementia Brother   . Heart failure Maternal Grandmother   . Diabetes Maternal Grandmother   . Stroke Neg Hx   . Cancer Maternal Aunt 61    breast cancer  . Diabetes Maternal Aunt     Medications: Patient's Medications  New Prescriptions   No medications on file  Previous Medications   ARTIFICIAL TEAR OP    Place 2 drops into both eyes daily.    ASPIRIN 81 MG TABLET    Take 81 mg by mouth daily.    ATORVASTATIN (LIPITOR) 20 MG TABLET    take 1 tablet by mouth once daily   B COMPLEX VITAMINS TABLET    Take 1 tablet by mouth daily.   BIOTIN 5000 MCG TABS    Take 1 tablet by mouth daily.   BYSTOLIC 5 MG TABLET    take 1 tablet by mouth once daily   CHOLECALCIFEROL (VITAMIN D) 2000 UNITS CAPS    Take 1 capsule by mouth daily.   CO-ENZYME Q-10 50 MG CAPSULE    Take 100 mg by mouth daily.    DICLOFENAC SODIUM (VOLTAREN) 1 % GEL    Apply 2 g topically 4 (four) times daily as needed.   DONEPEZIL (ARICEPT) 10 MG TABLET    Take 1 tablet (10 mg total) by mouth at bedtime.   FLUTICASONE FUROATE-VILANTEROL (BREO ELLIPTA) 100-25 MCG/INH AEPB    Inhale 1 puff into the lungs daily.   FUROSEMIDE (LASIX) 20 MG TABLET    Take 40 mg by mouth every other day.   IPRATROPIUM (ATROVENT) 0.06 % NASAL SPRAY    Place 2 sprays into both nostrils 2 (two) times daily.    LOSARTAN (COZAAR) 50 MG TABLET    Take one tablet by mouth once daily for blood pressure   MEMANTINE (NAMENDA) 10 MG TABLET    Take 1 tablet (10 mg total) by mouth 2 (two) times daily.   MULTIPLE VITAMINS-MINERALS (ALIVE WOMENS 50+ PO)    Take 1 tablet by mouth daily. (ALIVE WOMENS 50+)   MULTIPLE VITAMINS-MINERALS (PRESERVISION AREDS 2) CAPS    Take 1 capsule by mouth daily.   PANTOPRAZOLE (PROTONIX) 40 MG TABLET    Take 1 tablet (40 mg total) by mouth daily.   RANITIDINE (ZANTAC) 150 MG TABLET    Take 1 tablet (150 mg total) by mouth at bedtime.   VAYACOG 100-19.5-6.5 MG CAPS    take 1 capsule by mouth once daily  Modified Medications   No medications on file  Discontinued Medications   ALBUTEROL (PROVENTIL HFA;VENTOLIN HFA) 108 (90 BASE) MCG/ACT INHALER    Inhale 2 puffs into lungs TID x 7 days then BID x 7 days then daily x 7 days and stop   IPRATROPIUM (ATROVENT) 0.03 % NASAL SPRAY    Place 2 sprays into both nostrils 4 (four) times daily. Reported on 05/12/2016   LEVOFLOXACIN (LEVAQUIN) 500 MG TABLET    Take 1 tablet (500 mg total) by mouth  daily.   PREDNISONE (DELTASONE) 10 MG TABLET    Take 4 tabs po daily x 2 days then 3 tabs po daily x 2 days then 2 tabs po daily x 2 days then 1 tab po daily x 2 days and stop   TDAP (BOOSTRIX) 5-2.5-18.5 LF-MCG/0.5 INJECTION    Inject 0.5 mLs into  the muscle once.     Physical Exam:  Filed Vitals:   06/12/16 1453  BP: 120/72  Pulse: 73  Temp: 98.2 F (36.8 C)  TempSrc: Oral  Resp: 20  Height: 5\' 3"  (1.6 m)  Weight: 190 lb 6.4 oz (86.365 kg)  SpO2: 92%   Body mass index is 33.74 kg/(m^2).  Physical Exam  Constitutional: She is oriented to person, place, and time. She appears well-developed and well-nourished.  HENT:  Mouth/Throat: Oropharynx is clear and moist. No oropharyngeal exudate.  Eyes: Pupils are equal, round, and reactive to light. No scleral icterus.  Neck: Neck supple. Carotid bruit is not present. No tracheal deviation present. No thyromegaly present.  Cardiovascular: Normal rate, regular rhythm and normal heart sounds.   Pulmonary/Chest: Effort normal and breath sounds normal. No stridor. No respiratory distress. She has no wheezes. She has no rales.  Abdominal: Soft. Bowel sounds are normal. She exhibits no distension and no mass. There is no hepatomegaly. There is no tenderness. There is no rebound and no guarding.  Lymphadenopathy:    She has no cervical adenopathy.  Neurological: She is alert and oriented to person, place, and time.  Skin: Skin is warm and dry. No rash noted.  Psychiatric: She has a normal mood and affect. Her behavior is normal. Judgment and thought content normal.    Labs reviewed: Basic Metabolic Panel:  Recent Labs  05/09/16 1621 05/15/16 1526 06/02/16 1459  NA 143 142 140  K 4.4 4.0 4.1  CL 102 101 97*  CO2 32* 29 29  GLUCOSE 103* 95 77  BUN 23 28* 32*  CREATININE 1.59* 1.60* 1.64*  CALCIUM 9.1 9.6 8.8   Liver Function Tests:  Recent Labs  08/01/15 0857  AST 28   No results for input(s): LIPASE, AMYLASE in the last  8760 hours. No results for input(s): AMMONIA in the last 8760 hours. CBC:  Recent Labs  08/16/15 1552 01/17/16 1606 01/28/16 1510 01/31/16 1520  WBC 8.3 CANCELED 8.1 9.1  NEUTROABS 5.0  --  5.3 7.2  HGB  --   --  13.7 13.9  HCT 40.9 CANCELED 41.1 41.8  MCV 87  --  87.1 84.8  PLT 263 CANCELED 249 239.0   Lipid Panel:  Recent Labs  08/01/15 0857  CHOL 167  HDL 61.40  LDLCALC 81  TRIG 119.0  CHOLHDL 3   TSH: No results for input(s): TSH in the last 8760 hours. A1C: Lab Results  Component Value Date   HGBA1C 5.6 04/12/2015     Assessment/Plan 1. COPD exacerbation ([place]) Improved. Completed levaquin and predinsone taper -to cont breo daily to help with COPD symptoms   2. Memory disorder -stable, conts on aricept and namenda. Had to change medication to the morning due to having nightmares  3. Nausea without vomiting -improved now that she has weaned off coffee and taking nutritional supplement.  -will stop some of the supplements that she is taking as they are causing pill burden and could also be contributing to nausea   4. CKD (chronic kidney disease), stage 3 (moderate) -referral has already been placed to nephrology   5. Hyperlipidemia -conts on lipitor, will follow up fasting lipid panel and LFT  Jessica K. Harle Battiest  [facility] [phone] 8 am - 5 pm) [phone] (after hours)

## 2016-06-18 ENCOUNTER — Other Ambulatory Visit: Payer: Medicare Other

## 2016-06-19 ENCOUNTER — Other Ambulatory Visit (INDEPENDENT_AMBULATORY_CARE_PROVIDER_SITE_OTHER): Payer: Medicare Other | Admitting: *Deleted

## 2016-06-19 DIAGNOSIS — I5032 Chronic diastolic (congestive) heart failure: Secondary | ICD-10-CM | POA: Diagnosis not present

## 2016-06-19 LAB — BASIC METABOLIC PANEL
BUN: 31 mg/dL — ABNORMAL HIGH (ref 7–25)
CALCIUM: 8.7 mg/dL (ref 8.6–10.4)
CO2: 32 mmol/L — AB (ref 20–31)
Chloride: 102 mmol/L (ref 98–110)
Creat: 1.24 mg/dL — ABNORMAL HIGH (ref 0.60–0.93)
Glucose, Bld: 87 mg/dL (ref 65–99)
Potassium: 4.9 mmol/L (ref 3.5–5.3)
SODIUM: 143 mmol/L (ref 135–146)

## 2016-06-20 LAB — BRAIN NATRIURETIC PEPTIDE: BRAIN NATRIURETIC PEPTIDE: 291.2 pg/mL — AB (ref <100)

## 2016-06-27 ENCOUNTER — Other Ambulatory Visit: Payer: Self-pay | Admitting: *Deleted

## 2016-06-27 DIAGNOSIS — I5032 Chronic diastolic (congestive) heart failure: Secondary | ICD-10-CM

## 2016-07-11 ENCOUNTER — Encounter: Payer: Self-pay | Admitting: *Deleted

## 2016-07-14 ENCOUNTER — Other Ambulatory Visit: Payer: Self-pay | Admitting: Neurology

## 2016-07-15 ENCOUNTER — Telehealth: Payer: Self-pay | Admitting: Internal Medicine

## 2016-07-15 NOTE — Telephone Encounter (Signed)
Spoke with patient's brother, [name].   He hadn't been weighing patient regularly for a while.  Weighed her yesterday. 187.4 This is in range of her last weights recorded in EPIC.   He denies that she is in distress or having any specific symptoms.   She feels her clothes fit tighter.  They will continue to monitor weights.  Patient has labs scheduled next week.  Advised to call back with any concerns, he verbalizes understanding and appreciation for the call.

## 2016-07-15 NOTE — Telephone Encounter (Signed)
New Message:    Please call,question about her recent weight gain.

## 2016-07-16 ENCOUNTER — Other Ambulatory Visit: Payer: Self-pay | Admitting: *Deleted

## 2016-07-16 MED ORDER — FUROSEMIDE 40 MG PO TABS: 40.0000 mg | ORAL_TABLET | Freq: Every day | ORAL | 3 refills | Status: DC

## 2016-07-22 ENCOUNTER — Other Ambulatory Visit: Payer: Medicare Other

## 2016-07-23 ENCOUNTER — Other Ambulatory Visit: Payer: Medicare Other | Admitting: *Deleted

## 2016-07-23 DIAGNOSIS — I5032 Chronic diastolic (congestive) heart failure: Secondary | ICD-10-CM | POA: Diagnosis not present

## 2016-07-23 LAB — BASIC METABOLIC PANEL
BUN: 23 mg/dL (ref 7–25)
CHLORIDE: 102 mmol/L (ref 98–110)
CO2: 27 mmol/L (ref 20–31)
Calcium: 9 mg/dL (ref 8.6–10.4)
Creat: 1.33 mg/dL — ABNORMAL HIGH (ref 0.60–0.93)
GLUCOSE: 130 mg/dL — AB (ref 65–99)
Potassium: 4.2 mmol/L (ref 3.5–5.3)
SODIUM: 142 mmol/L (ref 135–146)

## 2016-07-29 ENCOUNTER — Ambulatory Visit (INDEPENDENT_AMBULATORY_CARE_PROVIDER_SITE_OTHER): Payer: Medicare Other | Admitting: Internal Medicine

## 2016-07-29 ENCOUNTER — Encounter: Payer: Self-pay | Admitting: Internal Medicine

## 2016-07-29 VITALS — BP 116/68 | HR 64 | Ht 63.0 in | Wt 192.0 lb

## 2016-07-29 DIAGNOSIS — K219 Gastro-esophageal reflux disease without esophagitis: Secondary | ICD-10-CM

## 2016-07-29 DIAGNOSIS — R110 Nausea: Secondary | ICD-10-CM | POA: Diagnosis not present

## 2016-07-29 MED ORDER — ONDANSETRON 4 MG PO TBDP: 4.0000 mg | ORAL_TABLET | Freq: Three times a day (TID) | ORAL | 0 refills | Status: DC | PRN

## 2016-07-29 MED ORDER — RANITIDINE HCL 150 MG PO TABS: 150.0000 mg | ORAL_TABLET | Freq: Two times a day (BID) | ORAL | 2 refills | Status: DC

## 2016-07-29 NOTE — Patient Instructions (Signed)
Please follow up with Dr Hilarie Fredrickson on Tuesday, 11/04/16 at 2:30 pm.  Discontinue Protonix.  We have sent the following medications to your pharmacy for you to pick up at your convenience: Zantac 150 mg twice daily Zofran 4 mg every 8 hours as needed  Please look over and follow the GERD diet we have given you today.  If you are age 78 or older, your body mass index should be between 23-30. Your Body mass index is 34.01 kg/m. If this is out of the aforementioned range listed, please consider follow up with your Primary Care Provider.  If you are age 72 or younger, your body mass index should be between 19-25. Your Body mass index is 34.01 kg/m. If this is out of the aformentioned range listed, please consider follow up with your Primary Care Provider.

## 2016-07-30 DIAGNOSIS — N183 Chronic kidney disease, stage 3 (moderate): Secondary | ICD-10-CM | POA: Diagnosis not present

## 2016-07-30 DIAGNOSIS — R413 Other amnesia: Secondary | ICD-10-CM | POA: Diagnosis not present

## 2016-07-30 DIAGNOSIS — Z6834 Body mass index (BMI) 34.0-34.9, adult: Secondary | ICD-10-CM | POA: Diagnosis not present

## 2016-07-30 DIAGNOSIS — N179 Acute kidney failure, unspecified: Secondary | ICD-10-CM | POA: Diagnosis not present

## 2016-07-30 DIAGNOSIS — I129 Hypertensive chronic kidney disease with stage 1 through stage 4 chronic kidney disease, or unspecified chronic kidney disease: Secondary | ICD-10-CM | POA: Diagnosis not present

## 2016-07-30 DIAGNOSIS — J449 Chronic obstructive pulmonary disease, unspecified: Secondary | ICD-10-CM | POA: Diagnosis not present

## 2016-07-30 NOTE — Progress Notes (Signed)
HPI: Virginia [name] is a 80 year old female with past medical history of COPD, dementia, hypertension, hyperlipidemia who is seen in consultation at the request of Sherrie Mustache, NP to evaluate chronic nausea. She's here today with her daughter and son. They report that over the last several months she's been dealing with chronic nausea without vomiting. She has not had any abdominal pain. She reports no trouble with swallowing. She denies heartburn but takes pantoprazole 40 mg daily and ranitidine 150 mg at bedtime. Her family somewhat concerned about PPI given her dementia. She takes Aricept and Namenda for dementia. Namenda was held for some time to see if nausea changed and it did not. Reportedly dominance of been regular. Appetite has been good infection has been able to gain weight. She remains active, though she is living with her son. She is active with yoga. They have made dietary modifications including cutting out coffee early in the morning and also avoiding acidic fruits for breakfast. She's been drinking more protein shakes and using a ginger candy which is helped with nausea. She states that her nausea is 80% better today.  Prior procedures include colonoscopy in 2010 with Dr. Earlean Shawl. This revealed no polyps, diverticulosis and internal hemorrhoids  She had an abdominal ultrasound in January 2016 performed for one day of nausea vomiting abdominal pain. This is reviewed and showed no acute abdominal process and a right-sided pleural effusion. Gallbladder was normal appearing.   Past Medical History:  Diagnosis Date  . Carotid artery occlusion   . CKD (chronic kidney disease)   . COPD (chronic obstructive pulmonary disease) ([place])   . Diverticulosis   . Fall at home Sept. 2013  . Hyperlipidemia   . Hyperlipidemia   . Hypertension   . Internal hemorrhoid   . Memory disorder 10/24/2014    Past Surgical History:  Procedure Laterality Date  . APPENDECTOMY    . BREAST REDUCTION SURGERY     . CAROTID ENDARTERECTOMY     left CEA  . CATARACT EXTRACTION Bilateral   . COLONOSCOPY  2015  . EYE SURGERY     cataracts removed, bilaterally   . skin cancer removal    . TONSILLECTOMY      Outpatient Medications Prior to Visit  Medication Sig Dispense Refill  . ARTIFICIAL TEAR OP Place 2 drops into both eyes daily.     Marland Kitchen aspirin 81 MG tablet Take 81 mg by mouth daily.    Marland Kitchen atorvastatin (LIPITOR) 20 MG tablet take 1 tablet by mouth once daily 90 tablet 3  . BYSTOLIC 5 MG tablet take 1 tablet by mouth once daily 90 tablet 3  . Cholecalciferol (VITAMIN D) 2000 UNITS CAPS Take 1 capsule by mouth daily.    . diclofenac sodium (VOLTAREN) 1 % GEL Apply 2 g topically 4 (four) times daily as needed. 100 g 3  . donepezil (ARICEPT) 10 MG tablet Take 1 tablet (10 mg total) by mouth at bedtime. 30 tablet 6  . fluticasone furoate-vilanterol (BREO ELLIPTA) 100-25 MCG/INH AEPB Inhale 1 puff into the lungs daily. 30 each 5  . furosemide (LASIX) 40 MG tablet Take 1 tablet (40 mg total) by mouth daily. 30 tablet 3  . ipratropium (ATROVENT) 0.06 % nasal spray Place 2 sprays into both nostrils 2 (two) times daily.   1  . losartan (COZAAR) 50 MG tablet Take one tablet by mouth once daily for blood pressure 90 tablet 1  . memantine (NAMENDA) 10 MG tablet Take 1 tablet (10 mg total)  by mouth 2 (two) times daily. 60 tablet 6  . Multiple Vitamins-Minerals (PRESERVISION AREDS 2) CAPS Take 1 capsule by mouth daily.    Marland Kitchen VAYACOG 100-19.5-6.5 MG CAPS take 1 capsule by mouth once daily 30 capsule 11  . pantoprazole (PROTONIX) 40 MG tablet Take 1 tablet (40 mg total) by mouth daily. 30 tablet 3  . ranitidine (ZANTAC) 150 MG tablet Take 1 tablet (150 mg total) by mouth at bedtime. 30 tablet 6   No facility-administered medications prior to visit.     Allergies  Allergen Reactions  . Lidocaine Anaphylaxis, Swelling, Rash and Other (See Comments)    Any of the " [facility] "  . Other Other (See Comments)     All drugs that end in "cane'-  novacane etc. Daughter states patient almost died when she had it when she was born    Family History  Problem Relation Age of Onset  . Heart disease Father     Before age 59  . Hypertension Father   . Heart attack Father   . Cancer Mother 7    Brain  . Dementia Brother   . Cancer Maternal Aunt 61    breast cancer  . Diabetes Maternal Aunt   . Heart failure Maternal Grandmother   . Diabetes Maternal Grandmother   . Stroke Neg Hx     Social History  Substance Use Topics  . Smoking status: Former Smoker    Years: 14.00    Types: Cigarettes    Quit date: 11/30/1994  . Smokeless tobacco: Never Used     Comment: very light smoker, not even every day   . Alcohol use No    ROS: As per history of present illness, otherwise negative  BP 116/68 (BP Location: Left Arm, Patient Position: Sitting, Cuff Size: Normal)   Pulse 64   Ht 5\' 3"  (1.6 m)   Wt 192 lb (87.1 kg)   BMI 34.01 kg/m  Constitutional: Well-developed and well-nourished. No distress. HEENT: Normocephalic and atraumatic. Oropharynx is clear and moist. No oropharyngeal exudate. Conjunctivae are normal.  No scleral icterus. Neck: Neck supple. Trachea midline. Cardiovascular: Normal rate, regular rhythm and intact distal pulses. No M/R/G Pulmonary/chest: Effort normal and breath sounds normal. No wheezing, rales or rhonchi. Abdominal: Soft, obese, nontender, nondistended. Bowel sounds active throughout.  Extremities: no clubbing, cyanosis, or edema Lymphadenopathy: No cervical adenopathy noted. Neurological: Alert and oriented to person place and time. Skin: Skin is warm and dry. No rashes noted. Psychiatric: Normal mood and affect. Behavior is normal.  RELEVANT LABS AND IMAGING: CBC    Component Value Date/Time   WBC 9.1 01/31/2016 1520   RBC 4.93 01/31/2016 1520   HGB 13.9 01/31/2016 1520   HCT 41.8 01/31/2016 1520   HCT CANCELED 01/17/2016 1606   PLT 239.0 01/31/2016  1520   PLT CANCELED 01/17/2016 1606   MCV 84.8 01/31/2016 1520   MCV 87 08/16/2015 1552   MCH 29.0 01/28/2016 1510   MCHC 33.2 01/31/2016 1520   RDW 14.6 01/31/2016 1520   RDW 14.1 08/16/2015 1552   LYMPHSABS 1.0 01/31/2016 1520   LYMPHSABS CANCELED 01/17/2016 1606   MONOABS 0.8 01/31/2016 1520   EOSABS 0.1 01/31/2016 1520   EOSABS CANCELED 01/17/2016 1606   BASOSABS 0.0 01/31/2016 1520   BASOSABS CANCELED 01/17/2016 1606    CMP     Component Value Date/Time   NA 142 07/23/2016 1350   NA 149 (H) 01/17/2016 1606   K 4.2 07/23/2016 1350  CL 102 07/23/2016 1350   CO2 27 07/23/2016 1350   GLUCOSE 130 (H) 07/23/2016 1350   BUN 23 07/23/2016 1350   BUN 24 01/17/2016 1606   CREATININE 1.33 (H) 07/23/2016 1350   CALCIUM 9.0 07/23/2016 1350   PROT 6.4 12/29/2014 1242   ALBUMIN 3.4 (L) 12/29/2014 1242   AST 28 08/01/2015 0857   ALT 17 12/29/2014 1242   ALKPHOS 68 12/29/2014 1242   BILITOT 0.9 12/29/2014 1242   GFRNONAA 53 (L) 01/17/2016 1606   GFRAA 61 01/17/2016 1606    ASSESSMENT/PLAN: 80 year old female with past medical history of COPD, dementia, hypertension, hyperlipidemia who is seen in consultation at the request of Sherrie Mustache, NP to evaluate chronic nausea.   1. Nausea without vomiting -- her nausea has improved by 80% with dietary modifications and eating smaller more frequent meals. There are no true alarm symptoms. In fact her nausea may be more related to medications. Her family has spread out some of her medication administration times which seems to have helped. I'm going to discontinue her pantoprazole over family concern of dementia, though we discussed this data was not strong. We will do ranitidine 150 mg twice a day and she was given a copy of her GERD diet. Prescription for Zofran 4 mg every 8 hours as needed for nausea. Follow-up in November December 2017, sooner if necessary.    [id number] Carter, Do [address] [place], [place] [address]

## 2016-07-31 ENCOUNTER — Other Ambulatory Visit: Payer: Self-pay | Admitting: *Deleted

## 2016-07-31 DIAGNOSIS — I5032 Chronic diastolic (congestive) heart failure: Secondary | ICD-10-CM

## 2016-07-31 DIAGNOSIS — N183 Chronic kidney disease, stage 3 (moderate): Secondary | ICD-10-CM | POA: Diagnosis not present

## 2016-08-05 ENCOUNTER — Other Ambulatory Visit: Payer: Self-pay

## 2016-08-15 ENCOUNTER — Encounter: Payer: Self-pay | Admitting: Internal Medicine

## 2016-08-15 ENCOUNTER — Ambulatory Visit (INDEPENDENT_AMBULATORY_CARE_PROVIDER_SITE_OTHER): Payer: Medicare Other | Admitting: Internal Medicine

## 2016-08-15 VITALS — BP 120/72 | HR 72 | Temp 98.1°F | Ht 63.0 in | Wt 188.6 lb

## 2016-08-15 DIAGNOSIS — Z23 Encounter for immunization: Secondary | ICD-10-CM | POA: Diagnosis not present

## 2016-08-15 DIAGNOSIS — M25511 Pain in right shoulder: Secondary | ICD-10-CM | POA: Diagnosis not present

## 2016-08-15 DIAGNOSIS — R413 Other amnesia: Secondary | ICD-10-CM | POA: Diagnosis not present

## 2016-08-15 DIAGNOSIS — J302 Other seasonal allergic rhinitis: Secondary | ICD-10-CM

## 2016-08-15 MED ORDER — HYDROCODONE-ACETAMINOPHEN 5-325 MG PO TABS: 1.0000 | ORAL_TABLET | Freq: Four times a day (QID) | ORAL | 0 refills | Status: DC | PRN

## 2016-08-15 NOTE — Progress Notes (Signed)
Patient ID: Virginia [name], female   DOB: 1935/12/15, 80 y.o.   MRN: [id number]    Location:  [facility] Place of Service: OFFICE  Chief Complaint  Patient presents with  . Acute Visit    right shoulder pain, pain has gotten worse  . Advanced Directive    discuss advance directive  . Flu Vaccine    requested    HPI:  80 yo female seen today for right shoulder pain. She gets PT once per week and chair yoga 4 times per week. Instructors have noticed increasing loss of ROM and pain with exercises. Question whether she has a tear in shoulder. Her pain is worse with movement, continuous unless at rest and is throbbing/aching sensation. No known recent trauma. No numbness/tingling. Family would like to see Ortho Dr Marco Collie at [facility].   She also c/o seasonal allergy sx's despite using flonase and ipratropium nasal spray. She has a hx COPD and is followed by pulm Dr Melvyn Novas  She is a poor historian due to memory loss. Hx obtained from family. She takes aricept and namenda  Past Medical History:  Diagnosis Date  . Carotid artery occlusion   . CKD (chronic kidney disease)   . COPD (chronic obstructive pulmonary disease) ([place])   . Diverticulosis   . Fall at home Sept. 2013  . Hyperlipidemia   . Hyperlipidemia   . Hypertension   . Internal hemorrhoid   . Memory disorder 10/24/2014    Past Surgical History:  Procedure Laterality Date  . APPENDECTOMY    . BREAST REDUCTION SURGERY    . CAROTID ENDARTERECTOMY     left CEA  . CATARACT EXTRACTION Bilateral   . COLONOSCOPY  2015  . EYE SURGERY     cataracts removed, bilaterally   . skin cancer removal    . TONSILLECTOMY      Patient Care Team: Lauree Chandler, NP as PCP - General (Nurse Practitioner) Fay Records, MD as Consulting Physician (Cardiology) Tanda Rockers, MD as Consulting Physician (Pulmonary Disease)  Social History   Social History  . Marital status: Divorced    Spouse name: N/A  . Number of children: 2   . Years of education: 7   Occupational History  . retired    Social History Main Topics  . Smoking status: Former Smoker    Years: 14.00    Types: Cigarettes    Quit date: 11/30/1994  . Smokeless tobacco: Never Used     Comment: very light smoker, not even every day   . Alcohol use No  . Drug use: No  . Sexual activity: No   Other Topics Concern  . Not on file   Social History Narrative   Diet:       Do you drink/eat things with caffeine: yes      Marital Status: Divorced   What Year Married:1959      Do you live in a house, apartment, Assisted Living, Condo, trailer? House      Is it one or more stories? 2 stories      How many persons live in your home? Just Patient      Do you have any pets in your home? 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 Very Little   Type and how often? Walk      Do you have a living will? None   DNR?   Discuss  one? No      Do you have signed POA/HPOA forms?      Patient drinks 1 cup of caffeine daily.   Patient is right handed.     reports that she quit smoking about 21 years ago. Her smoking use included Cigarettes. She quit after 14.00 years of use. She has never used smokeless tobacco. She reports that she does not drink alcohol or use drugs.  Family History  Problem Relation Age of Onset  . Heart disease Father     Before age 44  . Hypertension Father   . Heart attack Father   . Cancer Mother 53    Brain  . Dementia Brother   . Cancer Maternal Aunt 61    breast cancer  . Diabetes Maternal Aunt   . Heart failure Maternal Grandmother   . Diabetes Maternal Grandmother   . Stroke Neg Hx    Family Status  Relation Status  . Father Deceased at age 99   MVA  . Mother Deceased at age 21  . Brother Alive  . Maternal Aunt Alive  . Brother Alive  . Sister Alive  . Daughter Alive  . Son Alive  . Maternal Grandmother   . Neg Hx      Allergies  Allergen Reactions  . Lidocaine  Anaphylaxis, Swelling, Rash and Other (See Comments)    Any of the " [facility] "  . Other Other (See Comments)    All drugs that end in "cane'-  novacane etc. Daughter states patient almost died when she had it when she was born    Medications: Patient's Medications  New Prescriptions   No medications on file  Previous Medications   ARTIFICIAL TEAR OP    Place 2 drops into both eyes daily.    ASPIRIN 81 MG TABLET    Take 81 mg by mouth daily.   ATORVASTATIN (LIPITOR) 20 MG TABLET    take 1 tablet by mouth once daily   B COMPLEX VITAMINS CAPSULE    Take 1 capsule by mouth daily.   BYSTOLIC 5 MG TABLET    take 1 tablet by mouth once daily   CHOLECALCIFEROL (VITAMIN D) 2000 UNITS CAPS    Take 1 capsule by mouth daily.   DICLOFENAC SODIUM (VOLTAREN) 1 % GEL    Apply 2 g topically 4 (four) times daily as needed.   DONEPEZIL (ARICEPT) 10 MG TABLET    Take 1 tablet (10 mg total) by mouth at bedtime.   FLUTICASONE FUROATE-VILANTEROL (BREO ELLIPTA) 100-25 MCG/INH AEPB    Inhale 1 puff into the lungs daily.   FUROSEMIDE (LASIX) 40 MG TABLET    Take 1 tablet (40 mg total) by mouth daily.   IPRATROPIUM (ATROVENT) 0.06 % NASAL SPRAY    Place 2 sprays into both nostrils 2 (two) times daily.    MEMANTINE (NAMENDA) 10 MG TABLET    Take 1 tablet (10 mg total) by mouth 2 (two) times daily.   MULTIPLE VITAMINS-MINERALS (PRESERVISION AREDS 2) CAPS    Take 1 capsule by mouth daily.   ONDANSETRON (ZOFRAN ODT) 4 MG DISINTEGRATING TABLET    Take 1 tablet (4 mg total) by mouth every 8 (eight) hours as needed for nausea or vomiting.   RANITIDINE (ZANTAC) 150 MG TABLET    Take 1 tablet (150 mg total) by mouth 2 (two) times daily.   VAYACOG 100-19.5-6.5 MG CAPS    take 1 capsule by mouth once daily  Modified Medications  No medications on file  Discontinued Medications   No medications on file    Review of Systems  Unable to perform ROS: Other  memory loss  Vitals:   08/15/16 1422  BP: 120/72    Pulse: 72  Temp: 98.1 F (36.7 C)  TempSrc: Oral  SpO2: 94%  Weight: 188 lb 9.6 oz (85.5 kg)  Height: 5\' 3"  (1.6 m)   Body mass index is 33.41 kg/m.  Physical Exam  Constitutional: She appears well-developed.  Frail appearing in NAD  Musculoskeletal: She exhibits edema and tenderness.  Left shoulder with FROM; right shoulder with marked reduced ROM at 10-15 degrees; (+) TTP at right [facility] joint with swelling; right posterior rotator cuff insertion hypertrophy; unable to adequately perform Apley scratch test due to reduced ROM; grip strength reduced on right  Skin: Skin is warm and dry. No rash noted.  Psychiatric: She has a normal mood and affect. Her behavior is normal.     Labs reviewed: Lab on 07/23/2016  Component Date Value Ref Range Status  . Sodium 07/23/2016 142  135 - 146 mmol/L Final  . Potassium 07/23/2016 4.2  3.5 - 5.3 mmol/L Final  . Chloride 07/23/2016 102  98 - 110 mmol/L Final  . CO2 07/23/2016 27  20 - 31 mmol/L Final  . Glucose, Bld 07/23/2016 130* 65 - 99 mg/dL Final  . BUN 07/23/2016 23  7 - 25 mg/dL Final  . Creat 07/23/2016 1.33* 0.60 - 0.93 mg/dL Final   Comment:   For patients > or = 78 years of age: The upper reference limit for Creatinine is approximately 13% higher for people identified as African-American.     . Calcium 07/23/2016 9.0  8.6 - 10.4 mg/dL Final  Lab on 06/19/2016  Component Date Value Ref Range Status  . Sodium 06/19/2016 143  135 - 146 mmol/L Final  . Potassium 06/19/2016 4.9  3.5 - 5.3 mmol/L Final  . Chloride 06/19/2016 102  98 - 110 mmol/L Final  . CO2 06/19/2016 32* 20 - 31 mmol/L Final  . Glucose, Bld 06/19/2016 87  65 - 99 mg/dL Final  . BUN 06/19/2016 31* 7 - 25 mg/dL Final  . Creat 06/19/2016 1.24* 0.60 - 0.93 mg/dL Final   Comment:   For patients > or = 78 years of age: The upper reference limit for Creatinine is approximately 13% higher for people identified as African-American.     . Calcium 06/19/2016 8.7  8.6  - 10.4 mg/dL Final  . Brain Natriuretic Peptide 06/20/2016 291.2* <100 pg/mL Final   Comment:   BNP levels increase with age in the general population with the highest values seen in individuals greater than 9 years of age. Reference: Joellyn Rued Cardiol 2002; [id number].     Lab on 06/02/2016  Component Date Value Ref Range Status  . Sodium 06/03/2016 140  135 - 146 mmol/L Final  . Potassium 06/03/2016 4.1  3.5 - 5.3 mmol/L Final  . Chloride 06/03/2016 97* 98 - 110 mmol/L Final  . CO2 06/03/2016 29  20 - 31 mmol/L Final  . Glucose, Bld 06/03/2016 77  65 - 99 mg/dL Final  . BUN 06/03/2016 32* 7 - 25 mg/dL Final  . Creat 06/03/2016 1.64* 0.60 - 0.93 mg/dL Final   Comment:   For patients > or = 78 years of age: The upper reference limit for Creatinine is approximately 13% higher for people identified as African-American.     . Calcium 06/03/2016 8.8  8.6 -  10.4 mg/dL Final    No results found.   Assessment/Plan   ICD-9-CM ICD-10-CM   1. Right shoulder pain 719.41 M25.511 Ambulatory referral to Orthopedic Surgery     HYDROcodone-acetaminophen (NORCO) 5-325 MG tablet   probable AC joint +/- acromial bursitis  2. Encounter for immunization Z23 Z23 Flu Vaccine QUAD 36+ mos IM  3. Memory disorder 780.93 R41.3   4. Other seasonal allergic rhinitis 477.8 J30.2     Take pain med as ordered. Recommend take with food.   Takes stools softener daily  Use flonase as directed  No heavy lifting, pushing or pulling until seen by Ortho  Follow up as scheduled  Stephani Janak S. Perlie Gold  [facility] [address] [place], [place] [address] [phone] Cell (Monday-Friday 8 AM - 5 PM) [phone] After 5 PM and follow prompts

## 2016-08-15 NOTE — Patient Instructions (Addendum)
Will call with Ortho referral  Take pain med as ordered. Recommend take with food.   Takes stools softener daily  Use flonase as directed  No heavy lifting, pushing or pulling until seen by Ortho  Follow up as scheduled

## 2016-08-18 ENCOUNTER — Telehealth: Payer: Self-pay | Admitting: *Deleted

## 2016-08-18 NOTE — Telephone Encounter (Signed)
Pt already has diclofenac gel (anti-inflammatory) and she can rub this on shoulder. Recommend cont hydrocodone but use 1/2 tab. Needs Ortho appt as soon as possible

## 2016-08-18 NOTE — Telephone Encounter (Signed)
[name], Notified and agreed. Referral marked Urgent

## 2016-08-18 NOTE — Telephone Encounter (Signed)
Patient son, [name] called and stated that you gave patient Hydrocodone for her right shoulder pain and it does not help, stated that it makes her loopy.  Suggests an Anti Inflammatory or muscle relaxer. Please Advise.  (can leave message on voicemail)

## 2016-08-19 ENCOUNTER — Telehealth: Payer: Self-pay | Admitting: Internal Medicine

## 2016-08-19 DIAGNOSIS — I5032 Chronic diastolic (congestive) heart failure: Secondary | ICD-10-CM

## 2016-08-19 NOTE — Telephone Encounter (Signed)
Called and spoke with son Received note from Dr Lorrene Reid (renal) Recomm stop Voltaren  She has done this She would recomm stopping losartan  I agree  Recomm she stop Keep track of BP at home   Will have someone from office call to set up labs in 3 wks  (BMET, BNP)

## 2016-08-21 NOTE — Addendum Note (Signed)
Addended by: Rodman Key on: 08/21/2016 10:41 AM   Modules accepted: Orders

## 2016-08-21 NOTE — Telephone Encounter (Signed)
Placed orders for BMET and BNP for in about 3 weeks.   Patient has appointment with her PCP on 10/12 and can have labs drawn that day.  This was previously discussed with patient's brother [name] (see lab note 8/31).    Called and left detailed message on David's voice mail that it is ok to have labs drawn at PCP office on 10/12, but if they can't do it, or that appointment isn't kept, that they need to call here to schedule lab appointment.  Advised to call back with any questions.

## 2016-08-22 DIAGNOSIS — M7051 Other bursitis of knee, right knee: Secondary | ICD-10-CM | POA: Diagnosis not present

## 2016-08-22 DIAGNOSIS — M1711 Unilateral primary osteoarthritis, right knee: Secondary | ICD-10-CM | POA: Diagnosis not present

## 2016-08-22 DIAGNOSIS — M19011 Primary osteoarthritis, right shoulder: Secondary | ICD-10-CM | POA: Diagnosis not present

## 2016-08-28 DIAGNOSIS — L821 Other seborrheic keratosis: Secondary | ICD-10-CM | POA: Diagnosis not present

## 2016-08-28 DIAGNOSIS — L814 Other melanin hyperpigmentation: Secondary | ICD-10-CM | POA: Diagnosis not present

## 2016-08-28 DIAGNOSIS — Z85828 Personal history of other malignant neoplasm of skin: Secondary | ICD-10-CM | POA: Diagnosis not present

## 2016-08-28 DIAGNOSIS — D1801 Hemangioma of skin and subcutaneous tissue: Secondary | ICD-10-CM | POA: Diagnosis not present

## 2016-08-28 DIAGNOSIS — D485 Neoplasm of uncertain behavior of skin: Secondary | ICD-10-CM | POA: Diagnosis not present

## 2016-09-04 ENCOUNTER — Telehealth: Payer: Self-pay

## 2016-09-04 NOTE — Telephone Encounter (Signed)
Patient's brother called requesting lab order to be faxed to [facility] to have labs drawn prior to pending appointment with Janett Billow on 09-11-16  Patient is more comfortable and at ease with the Lab Tech at [facility]. Patient's brother asked that we go ahead and recheck BMET to follow-up on kidney function as it was abnormal on 07/23/16 and Dr.Ross indicated recheck in 2 months.  Lab Order would include  Lipid E78.5 CBCD N18.3 CMP (this will cover the BMP and Hepatic Panel) N18.3

## 2016-09-05 ENCOUNTER — Other Ambulatory Visit (INDEPENDENT_AMBULATORY_CARE_PROVIDER_SITE_OTHER): Payer: Medicare Other

## 2016-09-05 DIAGNOSIS — I5032 Chronic diastolic (congestive) heart failure: Secondary | ICD-10-CM

## 2016-09-05 LAB — BASIC METABOLIC PANEL
BUN: 29 mg/dL — ABNORMAL HIGH (ref 7–25)
CALCIUM: 8.7 mg/dL (ref 8.6–10.4)
CHLORIDE: 104 mmol/L (ref 98–110)
CO2: 27 mmol/L (ref 20–31)
Creat: 1.2 mg/dL — ABNORMAL HIGH (ref 0.60–0.93)
GLUCOSE: 72 mg/dL (ref 65–99)
POTASSIUM: 4.1 mmol/L (ref 3.5–5.3)
SODIUM: 141 mmol/L (ref 135–146)

## 2016-09-06 LAB — BRAIN NATRIURETIC PEPTIDE: Brain Natriuretic Peptide: 130.7 pg/mL — ABNORMAL HIGH (ref <100)

## 2016-09-08 DIAGNOSIS — M1711 Unilateral primary osteoarthritis, right knee: Secondary | ICD-10-CM | POA: Diagnosis not present

## 2016-09-08 DIAGNOSIS — M19011 Primary osteoarthritis, right shoulder: Secondary | ICD-10-CM | POA: Diagnosis not present

## 2016-09-11 ENCOUNTER — Encounter: Payer: Self-pay | Admitting: Nurse Practitioner

## 2016-09-11 ENCOUNTER — Ambulatory Visit (INDEPENDENT_AMBULATORY_CARE_PROVIDER_SITE_OTHER): Payer: Medicare Other | Admitting: Nurse Practitioner

## 2016-09-11 ENCOUNTER — Telehealth: Payer: Self-pay | Admitting: Internal Medicine

## 2016-09-11 VITALS — BP 122/88 | HR 71 | Temp 98.1°F | Resp 17 | Ht 63.0 in | Wt 188.6 lb

## 2016-09-11 DIAGNOSIS — M170 Bilateral primary osteoarthritis of knee: Secondary | ICD-10-CM

## 2016-09-11 DIAGNOSIS — K219 Gastro-esophageal reflux disease without esophagitis: Secondary | ICD-10-CM | POA: Diagnosis not present

## 2016-09-11 DIAGNOSIS — N183 Chronic kidney disease, stage 3 unspecified: Secondary | ICD-10-CM

## 2016-09-11 DIAGNOSIS — R413 Other amnesia: Secondary | ICD-10-CM

## 2016-09-11 DIAGNOSIS — R110 Nausea: Secondary | ICD-10-CM | POA: Diagnosis not present

## 2016-09-11 MED ORDER — PANTOPRAZOLE SODIUM 40 MG PO TBEC: 40.0000 mg | DELAYED_RELEASE_TABLET | Freq: Every day | ORAL | 3 refills | Status: DC

## 2016-09-11 NOTE — Telephone Encounter (Signed)
Pts Brother Virginia [name] (on [place]) called, to obtain pts lab results done at our office on 10/6.  Informed the pts brother that per Dr Harrington Challenger, her electrolytes and kidney function are ok, and her BNP showed fluid is mildly elevated, and she recommends the pt to continue her current medication regimen.  Per the pts brother, he request a copy of these lab results to be mailed to the pts confirmed mailing address.  Informed the pts brother that I will have [name] Mercury in medical records mail this to the pts address.  Brother verbalized understanding and agrees with this plan.     Notes Recorded by Fay Records, MD on 09/07/2016 at 9:20 PM EDT Electrolytes and kidney funciton OK  Fluid up mildly I would keep on sam meds

## 2016-09-11 NOTE — Progress Notes (Signed)
Careteam: Patient Care Team: Lauree Chandler, NP as PCP - General (Nurse Practitioner) Fay Records, MD as Consulting Physician (Cardiology) Tanda Rockers, MD as Consulting Physician (Pulmonary Disease)  Advanced Directive information Does patient have an advance directive?: Yes, Type of Advance Directive: Healthcare Power of [place];Living will  Allergies  Allergen Reactions  . Lidocaine Anaphylaxis, Swelling, Rash and Other (See Comments)    Any of the " [facility] "  . Other Other (See Comments)    All drugs that end in "cane'-  novacane etc. Daughter states patient almost died when she had it when she was born    Chief Complaint  Patient presents with  . Medical Management of Chronic Issues    3 month office visit  . Other    brother and son in room with patient.     HPI: Patient is a 80 y.o. female seen in the office today for routine follow up.   Went to orthopedic due to right shoulder pain-- dx with arthritis and right knee bursitis, overall doing better.  Went to nephrologist, no changes in medication, ongoing monitoring of renal function Was taken voltaren tablet which was stopped and has not been using voltaren gel  GI- gave Rx for zofran when she has overwhelming nausea, but does not effect nausea.  Stopped all acid reflux medication after GI visit and then nausea got worse  Memory loss- progressive decline, memory is worse, following with neurology who has started her on vayacog daily, conts on namenda and aricept.    Review of Systems:  Review of Systems  Constitutional: Negative for activity change, appetite change, fatigue and unexpected weight change.  HENT: Negative for congestion and hearing loss.   Eyes: Negative.   Respiratory: Negative for cough and shortness of breath.   Cardiovascular: Negative for chest pain, palpitations and leg swelling.  Gastrointestinal: Positive for nausea. Negative for abdominal pain, constipation and diarrhea.   Genitourinary: Negative for difficulty urinating and dysuria.  Musculoskeletal: Positive for arthralgias (right shoulder and right knee). Negative for myalgias.  Skin: Negative for color change and wound.  Neurological: Negative for dizziness and weakness.  Psychiatric/Behavioral: Positive for confusion (dementia). Negative for agitation and behavioral problems.    Past Medical History:  Diagnosis Date  . Carotid artery occlusion   . CKD (chronic kidney disease)   . COPD (chronic obstructive pulmonary disease) ([place])   . Diverticulosis   . Fall at home Sept. 2013  . Hyperlipidemia   . Hyperlipidemia   . Hypertension   . Internal hemorrhoid   . Memory disorder 10/24/2014   Past Surgical History:  Procedure Laterality Date  . APPENDECTOMY    . BREAST REDUCTION SURGERY    . CAROTID ENDARTERECTOMY     left CEA  . CATARACT EXTRACTION Bilateral   . COLONOSCOPY  2015  . EYE SURGERY     cataracts removed, bilaterally   . skin cancer removal    . TONSILLECTOMY     Social History:   reports that she quit smoking about 21 years ago. Her smoking use included Cigarettes. She quit after 14.00 years of use. She has never used smokeless tobacco. She reports that she does not drink alcohol or use drugs.  Family History  Problem Relation Age of Onset  . Heart disease Father     Before age 43  . Hypertension Father   . Heart attack Father   . Cancer Mother 69    Brain  .  Dementia Brother   . Cancer Maternal Aunt 61    breast cancer  . Diabetes Maternal Aunt   . Heart failure Maternal Grandmother   . Diabetes Maternal Grandmother   . Stroke Neg Hx     Medications: Patient's Medications  New Prescriptions   No medications on file  Previous Medications   ARTIFICIAL TEAR OP    Place 2 drops into both eyes daily.    ASPIRIN 81 MG TABLET    Take 81 mg by mouth daily.   ATORVASTATIN (LIPITOR) 20 MG TABLET    take 1 tablet by mouth once daily   B COMPLEX VITAMINS CAPSULE    Take 1  capsule by mouth daily.   BYSTOLIC 5 MG TABLET    take 1 tablet by mouth once daily   CHOLECALCIFEROL (VITAMIN D) 2000 UNITS CAPS    Take 1 capsule by mouth daily.   DONEPEZIL (ARICEPT) 10 MG TABLET    Take 1 tablet (10 mg total) by mouth at bedtime.   FLUTICASONE FUROATE-VILANTEROL (BREO ELLIPTA) 100-25 MCG/INH AEPB    Inhale 1 puff into the lungs daily.   FUROSEMIDE (LASIX) 40 MG TABLET    Take 1 tablet (40 mg total) by mouth daily.   IPRATROPIUM (ATROVENT) 0.06 % NASAL SPRAY    Place 2 sprays into both nostrils 2 (two) times daily.    MEMANTINE (NAMENDA) 10 MG TABLET    Take 1 tablet (10 mg total) by mouth 2 (two) times daily.   MULTIPLE VITAMINS-MINERALS (PRESERVISION AREDS 2) CAPS    Take 1 capsule by mouth daily.   ONDANSETRON (ZOFRAN ODT) 4 MG DISINTEGRATING TABLET    Take 1 tablet (4 mg total) by mouth every 8 (eight) hours as needed for nausea or vomiting.   VAYACOG 100-19.5-6.5 MG CAPS    take 1 capsule by mouth once daily   VOLTAREN 1 % GEL    apply 2 grams to affected area four times a day if needed  Modified Medications   No medications on file  Discontinued Medications   HYDROCODONE-ACETAMINOPHEN (NORCO) 5-325 MG TABLET    Take 1 tablet by mouth every 6 (six) hours as needed for moderate pain.   RANITIDINE (ZANTAC) 150 MG TABLET    Take 1 tablet (150 mg total) by mouth 2 (two) times daily.     Physical Exam:  Vitals:   09/11/16 1443  BP: 122/88  Pulse: 71  Resp: 17  Temp: 98.1 F (36.7 C)  TempSrc: Oral  SpO2: 95%  Weight: 188 lb 9.6 oz (85.5 kg)  Height: 5\' 3"  (1.6 m)   Body mass index is 33.41 kg/m.  Physical Exam  Constitutional: She is oriented to person, place, and time. She appears well-developed and well-nourished.  HENT:  Mouth/Throat: Oropharynx is clear and moist. No oropharyngeal exudate.  Eyes: Pupils are equal, round, and reactive to light.  Neck: Neck supple. Carotid bruit is not present.  Cardiovascular: Normal rate, regular rhythm and normal  heart sounds.   Pulmonary/Chest: Effort normal and breath sounds normal. No respiratory distress.  Abdominal: Soft. Bowel sounds are normal. She exhibits no distension. There is no hepatomegaly.  Neurological: She is alert and oriented to person, place, and time.  Skin: Skin is warm and dry.  Psychiatric: She has a normal mood and affect. Her behavior is normal. Judgment and thought content normal.    Labs reviewed: Basic Metabolic Panel:  Recent Labs  06/19/16 1543 07/23/16 1350 09/05/16 1537  NA 143 142 141  K 4.9 4.2 4.1  CL 102 102 104  CO2 32* 27 27  GLUCOSE 87 130* 72  BUN 31* 23 29*  CREATININE 1.24* 1.33* 1.20*  CALCIUM 8.7 9.0 8.7  Estimated Creatinine Clearance: 40 mL/min (by C-G formula based on SCr of 1.2 mg/dL (H)).  Liver Function Tests: No results for input(s): AST, ALT, ALKPHOS, BILITOT, PROT, ALBUMIN in the last 8760 hours. No results for input(s): LIPASE, AMYLASE in the last 8760 hours. No results for input(s): AMMONIA in the last 8760 hours. CBC:  Recent Labs  01/17/16 1606 01/28/16 1510 01/31/16 1520  WBC CANCELED 8.1 9.1  NEUTROABS  --  5.3 7.2  HGB  --  13.7 13.9  HCT CANCELED 41.1 41.8  MCV  --  87.1 84.8  PLT CANCELED 249 239.0   Lipid Panel: No results for input(s): CHOL, HDL, LDLCALC, TRIG, CHOLHDL, LDLDIRECT in the last 8760 hours. TSH: No results for input(s): TSH in the last 8760 hours. A1C: Lab Results  Component Value Date   HGBA1C 5.6 04/12/2015     Assessment/Plan 1. Nausea Worse since GI visit however she is not taking anything for GERD.  To resume pantoprazole 40 mg daily and ranitidine 150 mg at bedtime and can cont zofran PRN  2. Primary osteoarthritis of both knees Could not tolerate norco, Has stopped voltaren PO due to worsening renal function, voltaren gel ordered but she is not taking. To use Gel QID PRN  3. Memory disorder Worsening memory loss, following with neurology. To cont aricept, namenda, and vayacog    4. Gastroesophageal reflux disease, esophagitis presence not specified - pantoprazole (PROTONIX) 40 MG tablet; Take 1 tablet (40 mg total) by mouth daily.  Dispense: 30 tablet; Refill: 3 pantoprazole 40 mg daily and ranitidine 150 mg at bedtime.  5. CKD (chronic kidney disease) stage 3, GFR 30-59 ml/min Renal function stable, cont to stay hydration and avoid nephrotoxic medication     Jessica K. Harle Battiest  [facility] [phone] 8 am - 5 pm) [phone] (after hours)

## 2016-09-11 NOTE — Telephone Encounter (Signed)
Mailed Labs to patient home address.

## 2016-09-11 NOTE — Telephone Encounter (Signed)
Pt returning call regarding labs and requesting resutls be mailed to her

## 2016-09-11 NOTE — Patient Instructions (Addendum)
Start Protonix 40 mg daily and ranitidine 150 mg at bedtime.  Ask Neurologist about [facility]

## 2016-09-17 ENCOUNTER — Ambulatory Visit (INDEPENDENT_AMBULATORY_CARE_PROVIDER_SITE_OTHER): Payer: Medicare Other | Admitting: Internal Medicine

## 2016-09-17 ENCOUNTER — Encounter: Payer: Self-pay | Admitting: Internal Medicine

## 2016-09-17 VITALS — BP 110/70 | HR 64 | Temp 98.3°F | Ht 63.0 in | Wt 188.2 lb

## 2016-09-17 DIAGNOSIS — J441 Chronic obstructive pulmonary disease with (acute) exacerbation: Secondary | ICD-10-CM

## 2016-09-17 DIAGNOSIS — J302 Other seasonal allergic rhinitis: Secondary | ICD-10-CM

## 2016-09-17 DIAGNOSIS — H6122 Impacted cerumen, left ear: Secondary | ICD-10-CM

## 2016-09-17 DIAGNOSIS — K219 Gastro-esophageal reflux disease without esophagitis: Secondary | ICD-10-CM | POA: Diagnosis not present

## 2016-09-17 DIAGNOSIS — R413 Other amnesia: Secondary | ICD-10-CM | POA: Diagnosis not present

## 2016-09-17 MED ORDER — IPRATROPIUM-ALBUTEROL 0.5-2.5 (3) MG/3ML IN SOLN
3.0000 mL | Freq: Once | RESPIRATORY_TRACT | Status: AC
  Administered 2016-09-17: 3 mL via RESPIRATORY_TRACT

## 2016-09-17 MED ORDER — PREDNISONE 10 MG PO TABS: ORAL_TABLET | ORAL | 0 refills | Status: DC

## 2016-09-17 MED ORDER — AZITHROMYCIN 250 MG PO TABS: ORAL_TABLET | ORAL | 0 refills | Status: DC

## 2016-09-17 MED ORDER — ALBUTEROL SULFATE HFA 108 (90 BASE) MCG/ACT IN AERS: INHALATION_SPRAY | RESPIRATORY_TRACT | 5 refills | Status: DC

## 2016-09-17 NOTE — Progress Notes (Signed)
Patient ID: Virginia [name], female   DOB: 08/02/1936, 80 y.o.   MRN: [id number]    Location:  [facility] Place of Service: OFFICE  Chief Complaint  Patient presents with  . Acute Visit    bad productive cough, some wheezing, no fever,     HPI:  80 yo female seen today for cough x 3 days with associated wheezing. She reports 1 week hx rhinorrhea and nasal congestion. She tried OTC mucinex which helps. No sore throat, HA, dizziness, CP/tightness, f/c, sinus pressure/pain. She has SOB, reduced appetite, sleep disturbance, ear popping. No sick contacts.  She has seasonal allergy sx's despite using flonase and ipratropium nasal spray. She is also taking claritin. She has a hx COPD and is followed by pulm Dr Melvyn Novas. She uses Breo  GERD - saw GI. She is taking protonix and zantac.  She is a poor historian due to memory loss. Hx obtained from family. She takes aricept and namenda    Past Medical History:  Diagnosis Date  . Carotid artery occlusion   . CKD (chronic kidney disease)   . COPD (chronic obstructive pulmonary disease) ([place])   . Diverticulosis   . Fall at home Sept. 2013  . Hyperlipidemia   . Hyperlipidemia   . Hypertension   . Internal hemorrhoid   . Memory disorder 10/24/2014    Past Surgical History:  Procedure Laterality Date  . APPENDECTOMY    . BREAST REDUCTION SURGERY    . CAROTID ENDARTERECTOMY     left CEA  . CATARACT EXTRACTION Bilateral   . COLONOSCOPY  2015  . EYE SURGERY     cataracts removed, bilaterally   . skin cancer removal    . TONSILLECTOMY      Patient Care Team: Lauree Chandler, NP as PCP - General (Nurse Practitioner) Fay Records, MD as Consulting Physician (Cardiology) Tanda Rockers, MD as Consulting Physician (Pulmonary Disease)  Social History   Social History  . Marital status: Divorced    Spouse name: N/A  . Number of children: 2  . Years of education: 7   Occupational History  . retired    Social History Main Topics  . Smoking  status: Former Smoker    Years: 14.00    Types: Cigarettes    Quit date: 11/30/1994  . Smokeless tobacco: Never Used     Comment: very light smoker, not even every day   . Alcohol use No  . Drug use: No  . Sexual activity: No   Other Topics Concern  . Not on file   Social History Narrative   Diet:       Do you drink/eat things with caffeine: yes      Marital Status: Divorced   What Year Married:1959      Do you live in a house, apartment, Assisted Living, Condo, trailer? House      Is it one or more stories? 2 stories      How many persons live in your home? Just Patient      Do you have any pets in your home? 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 Very Little   Type and how often? Walk      Do you have a living will? None   DNR?   Discuss one? No      Do you have signed POA/HPOA forms?      Patient drinks 1 cup  of caffeine daily.   Patient is right handed.     reports that she quit smoking about 21 years ago. Her smoking use included Cigarettes. She quit after 14.00 years of use. She has never used smokeless tobacco. She reports that she does not drink alcohol or use drugs.  Family History  Problem Relation Age of Onset  . Heart disease Father     Before age 78  . Hypertension Father   . Heart attack Father   . Cancer Mother 51    Brain  . Dementia Brother   . Cancer Maternal Aunt 61    breast cancer  . Diabetes Maternal Aunt   . Heart failure Maternal Grandmother   . Diabetes Maternal Grandmother   . Stroke Neg Hx    Family Status  Relation Status  . Father Deceased at age 14   MVA  . Mother Deceased at age 54  . Brother Alive  . Maternal Aunt Alive  . Brother Alive  . Sister Alive  . Daughter Alive  . Son Alive  . Maternal Grandmother   . Neg Hx      Allergies  Allergen Reactions  . Lidocaine Anaphylaxis, Swelling, Rash and Other (See Comments)    Any of the " [facility] "  . Other Other (See  Comments)    All drugs that end in "cane'-  novacane etc. Daughter states patient almost died when she had it when she was born    Medications: Patient's Medications  New Prescriptions   No medications on file  Previous Medications   ARTIFICIAL TEAR OP    Place 2 drops into both eyes daily.    ASPIRIN 81 MG TABLET    Take 81 mg by mouth daily.   ATORVASTATIN (LIPITOR) 20 MG TABLET    take 1 tablet by mouth once daily   B COMPLEX VITAMINS CAPSULE    Take 1 capsule by mouth daily.   BYSTOLIC 5 MG TABLET    take 1 tablet by mouth once daily   CHOLECALCIFEROL (VITAMIN D) 2000 UNITS CAPS    Take 1 capsule by mouth daily.   CINNAMON PO    Take 2,000 mg by mouth daily.   DONEPEZIL (ARICEPT) 10 MG TABLET    Take 1 tablet (10 mg total) by mouth at bedtime.   FLUTICASONE FUROATE-VILANTEROL (BREO ELLIPTA) 100-25 MCG/INH AEPB    Inhale 1 puff into the lungs daily.   FUROSEMIDE (LASIX) 40 MG TABLET    Take 1 tablet (40 mg total) by mouth daily.   GUAIFENESIN (MUCINEX) 600 MG 12 HR TABLET    Take 600 mg by mouth 2 (two) times daily.   IPRATROPIUM (ATROVENT) 0.06 % NASAL SPRAY    Place 2 sprays into both nostrils 2 (two) times daily.    MEMANTINE (NAMENDA) 10 MG TABLET    Take 1 tablet (10 mg total) by mouth 2 (two) times daily.   MULTIPLE VITAMINS-MINERALS (PRESERVISION AREDS 2) CAPS    Take 1 capsule by mouth daily.   OMEGA-3 300 MG CAPS    Take 1 tablet by mouth daily.   ONDANSETRON (ZOFRAN ODT) 4 MG DISINTEGRATING TABLET    Take 1 tablet (4 mg total) by mouth every 8 (eight) hours as needed for nausea or vomiting.   PANTOPRAZOLE (PROTONIX) 40 MG TABLET    Take 1 tablet (40 mg total) by mouth daily.   PHENYLEPHRINE (NEO-SYNEPHRINE) 1 % NASAL SPRAY    Place 2 drops into both  nostrils 3 (three) times daily.   POTASSIUM 99 MG TABS    Take 1 tablet by mouth daily.   PROBIOTIC PRODUCT (PROBIOTIC DAILY PO)    Take 1 capsule by mouth daily.   PSEUDOEPHEDRINE-DM-GG (TUSSIN COLD/COUGH PO)    Take 10 mLs by  mouth every 12 (twelve) hours.   VAYACOG 100-19.5-6.5 MG CAPS    take 1 capsule by mouth once daily   VOLTAREN 1 % GEL    apply 2 grams to affected area four times a day if needed  Modified Medications   No medications on file  Discontinued Medications   No medications on file    Review of Systems  Unable to perform ROS: Dementia    Vitals:   09/17/16 1449  BP: 110/70  Pulse: 64  Temp: 98.3 F (36.8 C)  TempSrc: Oral  SpO2: 96%  Weight: 188 lb 3.2 oz (85.4 kg)  Height: 5\' 3"  (1.6 m)   Body mass index is 33.34 kg/m.  Physical Exam  Constitutional: She appears well-developed and well-nourished.  Looks ill in NAD, no conversational dyspnea  HENT:  Left TM cerumen impaction. Right TM appears dull and slightly red. Nares with enlarged grey dry turbinates. Oropharynx cobblestoning and red but no exudate  Eyes: Pupils are equal, round, and reactive to light. No scleral icterus.  Neck: Neck supple.  Cardiovascular: Normal rate, regular rhythm and intact distal pulses.  Exam reveals no gallop and no friction rub.   Murmur (1/6 SEM) heard. Trace LE edema b/l. No calf TTP  Pulmonary/Chest: No respiratory distress. She has wheezes (end expiratory with prolonged expirtaory phase). She has no rales.  Reduced BS at base b/l  Musculoskeletal: She exhibits edema.  Lymphadenopathy:    She has no cervical adenopathy.  Neurological: She is alert.  Skin: Skin is warm and dry. No rash noted.  Psychiatric: She has a normal mood and affect. Her behavior is normal.     Labs reviewed: Lab on 09/05/2016  Component Date Value Ref Range Status  . Sodium 09/05/2016 141  135 - 146 mmol/L Final  . Potassium 09/05/2016 4.1  3.5 - 5.3 mmol/L Final  . Chloride 09/05/2016 104  98 - 110 mmol/L Final  . CO2 09/05/2016 27  20 - 31 mmol/L Final  . Glucose, Bld 09/05/2016 72  65 - 99 mg/dL Final  . BUN 09/05/2016 29* 7 - 25 mg/dL Final  . Creat 09/05/2016 1.20* 0.60 - 0.93 mg/dL Final   Comment:     For patients > or = 78 years of age: The upper reference limit for Creatinine is approximately 13% higher for people identified as African-American.     . Calcium 09/05/2016 8.7  8.6 - 10.4 mg/dL Final  . Brain Natriuretic Peptide 09/06/2016 130.7* <100 pg/mL Final   Comment:   BNP levels increase with age in the general population with the highest values seen in individuals greater than 49 years of age. Reference: Joellyn Rued Cardiol 2002; [id number].     Lab on 07/23/2016  Component Date Value Ref Range Status  . Sodium 07/23/2016 142  135 - 146 mmol/L Final  . Potassium 07/23/2016 4.2  3.5 - 5.3 mmol/L Final  . Chloride 07/23/2016 102  98 - 110 mmol/L Final  . CO2 07/23/2016 27  20 - 31 mmol/L Final  . Glucose, Bld 07/23/2016 130* 65 - 99 mg/dL Final  . BUN 07/23/2016 23  7 - 25 mg/dL Final  . Creat 07/23/2016 1.33* 0.60 - 0.93  mg/dL Final   Comment:   For patients > or = 78 years of age: The upper reference limit for Creatinine is approximately 13% higher for people identified as African-American.     . Calcium 07/23/2016 9.0  8.6 - 10.4 mg/dL Final  Lab on 06/19/2016  Component Date Value Ref Range Status  . Sodium 06/19/2016 143  135 - 146 mmol/L Final  . Potassium 06/19/2016 4.9  3.5 - 5.3 mmol/L Final  . Chloride 06/19/2016 102  98 - 110 mmol/L Final  . CO2 06/19/2016 32* 20 - 31 mmol/L Final  . Glucose, Bld 06/19/2016 87  65 - 99 mg/dL Final  . BUN 06/19/2016 31* 7 - 25 mg/dL Final  . Creat 06/19/2016 1.24* 0.60 - 0.93 mg/dL Final   Comment:   For patients > or = 78 years of age: The upper reference limit for Creatinine is approximately 13% higher for people identified as African-American.     . Calcium 06/19/2016 8.7  8.6 - 10.4 mg/dL Final  . Brain Natriuretic Peptide 06/20/2016 291.2* <100 pg/mL Final   Comment:   BNP levels increase with age in the general population with the highest values seen in individuals greater than 4 years of age. Reference: Joellyn Rued Cardiol 2002; [id number].       No results found.   Assessment/Plan   ICD-9-CM ICD-10-CM   1. COPD exacerbation (HCC) 491.21 J44.1 predniSONE (DELTASONE) 10 MG tablet     albuterol (PROVENTIL HFA;VENTOLIN HFA) 108 (90 Base) MCG/ACT inhaler     azithromycin (ZITHROMAX) 250 MG tablet     ipratropium-albuterol (DUONEB) 0.5-2.5 (3) MG/3ML nebulizer solution 3 mL  2. Other seasonal allergic rhinitis 477.8 J30.2   3. Memory disorder 780.93 R41.3   4. Gastroesophageal reflux disease, esophagitis presence not specified 530.81 K21.9   5.      Left cerumen impaction  Push fluids and rest  duoneb treatment given today  Start prednisone taper as directed  Take all of antibiotic as ordered  May take claritin daily for seasonal allergy  Use saline nasal spray as needed to keep nose moist  Take OTC probiotic/culturelle/activia while on antibiotic to keep colon healthy  DO NOT USE BREO WHILE BEING TREATED FOR COPD FLARE  Continue other medications as ordered  Ear lavage performed with successful removal of left cerumen impaction. Curette used. Left TM intact and appears red, dull but no bulging. No external ear canal swelling or redness  follow up as scheduled or sooner if no improvement   Amauri Medellin S. Perlie Gold  [facility] [address] [place], [place] [address] [phone] Cell (Monday-Friday 8 AM - 5 PM) [phone] After 5 PM and follow prompts

## 2016-09-17 NOTE — Patient Instructions (Addendum)
Push fluids and rest  duoneb treatment given today  Start prednisone taper as directed  Take all of antibiotic as ordered  May take claritin daily for seasonal allergy  Use saline nasal spray as needed to keep nose moist  Take OTC probiotic/culturelle/activia while on antibiotic to keep colon healthy  DO NOT USE BREO WHILE BEING TREATED FOR COPD FLARE  Continue other medications as ordered

## 2016-09-18 ENCOUNTER — Other Ambulatory Visit: Payer: Self-pay

## 2016-09-18 DIAGNOSIS — R059 Cough, unspecified: Secondary | ICD-10-CM

## 2016-09-18 DIAGNOSIS — R05 Cough: Secondary | ICD-10-CM

## 2016-09-18 DIAGNOSIS — J9611 Chronic respiratory failure with hypoxia: Secondary | ICD-10-CM

## 2016-09-18 DIAGNOSIS — R0600 Dyspnea, unspecified: Secondary | ICD-10-CM

## 2016-09-18 MED ORDER — AEROCHAMBER PLUS MISC: 3 refills | Status: DC

## 2016-09-18 NOTE — Telephone Encounter (Signed)
noted 

## 2016-09-18 NOTE — Telephone Encounter (Signed)
Spoke with [name], patient's daughter. RX sent to [facility] on [address]. Patient also had a fall today and patient's daughter would like her reevaluated tomorrow.  No additional information was provided about the fall for [name] was not with the patient and is in another state (will fly in tomorrow).

## 2016-09-18 NOTE — Telephone Encounter (Signed)
Female left message on triage voicemail: Patient was instructed to use a spacer with her inhaler and [facility] does not have any spacers, please call  I called [facility] and spoke with [place]. The pharmacy doesn't keep spacers in stock and she can order one for next day deliver. An order would need to be faxed over or patient could try a different pharmacy if she needs one today.  I called the patient back x 2, no answer (was unable to leave a message, no voicemail). I will try to call patient again later to see if she would like for me to send rx to [facility] or if she would like to pick-up and try another local pharmacy

## 2016-09-19 ENCOUNTER — Ambulatory Visit (INDEPENDENT_AMBULATORY_CARE_PROVIDER_SITE_OTHER): Payer: Medicare Other | Admitting: Internal Medicine

## 2016-09-19 ENCOUNTER — Encounter: Payer: Self-pay | Admitting: Internal Medicine

## 2016-09-19 ENCOUNTER — Ambulatory Visit
Admission: RE | Admit: 2016-09-19 | Discharge: 2016-09-19 | Disposition: A | Discharge status: Home / Self Care | Payer: Medicare Other | Source: Ambulatory Visit | Attending: Internal Medicine | Admitting: Internal Medicine

## 2016-09-19 VITALS — BP 124/68 | HR 68 | Temp 98.4°F | Ht 63.0 in | Wt 189.0 lb

## 2016-09-19 DIAGNOSIS — M545 Low back pain, unspecified: Secondary | ICD-10-CM

## 2016-09-19 DIAGNOSIS — M25561 Pain in right knee: Secondary | ICD-10-CM

## 2016-09-19 DIAGNOSIS — Y92099 Unspecified place in other non-institutional residence as the place of occurrence of the external cause: Secondary | ICD-10-CM | POA: Diagnosis not present

## 2016-09-19 DIAGNOSIS — J441 Chronic obstructive pulmonary disease with (acute) exacerbation: Secondary | ICD-10-CM

## 2016-09-19 DIAGNOSIS — R413 Other amnesia: Secondary | ICD-10-CM | POA: Diagnosis not present

## 2016-09-19 DIAGNOSIS — W19XXXA Unspecified fall, initial encounter: Secondary | ICD-10-CM

## 2016-09-19 DIAGNOSIS — Y92009 Unspecified place in unspecified non-institutional (private) residence as the place of occurrence of the external cause: Secondary | ICD-10-CM

## 2016-09-19 DIAGNOSIS — J302 Other seasonal allergic rhinitis: Secondary | ICD-10-CM | POA: Diagnosis not present

## 2016-09-19 DIAGNOSIS — S300XXA Contusion of lower back and pelvis, initial encounter: Secondary | ICD-10-CM | POA: Diagnosis not present

## 2016-09-19 NOTE — Patient Instructions (Addendum)
She does not meet criteria for home oxygen therapy at this time (needs <89% on room air)  Finish antibiotic as ordered  Finish prednisone therapy. May resume Breo at completion of prednisone  Follow up with pulmonary as scheduled  Will call with referral to allergist/asthma  RESUME CLARITIN 10mg  daily for seasonal allergy  Apply cool compress to bruised area over right pelvis/buttock  Follow up as scheduled

## 2016-09-19 NOTE — Progress Notes (Signed)
Patient ID: Virginia [name], female   DOB: 1936-07-13, 81 y.o.   MRN: [id number]    Location:  [facility] Place of Service: OFFICE  Chief Complaint  Patient presents with  . Acute Visit    fall    HPI:  80 yo female seen today for fall. She fell in the bathroom 2 days ago after her last visit. No known head trauma. She injured lower back, right knee and b/l ankle. Fall unwitnessed. She spent most of the day in bed on yesterday. No LOC. She does not know how long she was lying on the floor before one of her sons found her. She lives with her sons.  She is taking azithromycin and prednisone taper as ordered. COPD sx's improved since last visit. They are using the spacer with HFA which helps her get all of the medicine. Son would like allergist referral due to persistent seasonal allergy. Her claritin was stopped due to c/a drying her out. Family wonders if she should be on [place] O2. Pt denies SOB  She is a poor historian due to memory loss. Hx obtained from chart    Past Medical History:  Diagnosis Date  . Carotid artery occlusion   . CKD (chronic kidney disease)   . COPD (chronic obstructive pulmonary disease) ([place])   . Diverticulosis   . Fall at home Sept. 2013  . Hyperlipidemia   . Hyperlipidemia   . Hypertension   . Internal hemorrhoid   . Memory disorder 10/24/2014    Past Surgical History:  Procedure Laterality Date  . APPENDECTOMY    . BREAST REDUCTION SURGERY    . CAROTID ENDARTERECTOMY     left CEA  . CATARACT EXTRACTION Bilateral   . COLONOSCOPY  2015  . EYE SURGERY     cataracts removed, bilaterally   . skin cancer removal    . TONSILLECTOMY      Patient Care Team: Lauree Chandler, NP as PCP - General (Nurse Practitioner) Fay Records, MD as Consulting Physician (Cardiology) Tanda Rockers, MD as Consulting Physician (Pulmonary Disease)  Social History   Social History  . Marital status: Divorced    Spouse name: N/A  . Number of children: 2  . Years of  education: 7   Occupational History  . retired    Social History Main Topics  . Smoking status: Former Smoker    Years: 14.00    Types: Cigarettes    Quit date: 11/30/1994  . Smokeless tobacco: Never Used     Comment: very light smoker, not even every day   . Alcohol use No  . Drug use: No  . Sexual activity: No   Other Topics Concern  . Not on file   Social History Narrative   Diet:       Do you drink/eat things with caffeine: yes      Marital Status: Divorced   What Year Married:1959      Do you live in a house, apartment, Assisted Living, Condo, trailer? House      Is it one or more stories? 2 stories      How many persons live in your home? Just Patient      Do you have any pets in your home? 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 Very Little   Type and how often? Walk      Do you have a living will? None  DNR?   Discuss one? No      Do you have signed POA/HPOA forms?      Patient drinks 1 cup of caffeine daily.   Patient is right handed.     reports that she quit smoking about 21 years ago. Her smoking use included Cigarettes. She quit after 14.00 years of use. She has never used smokeless tobacco. She reports that she does not drink alcohol or use drugs.  Family History  Problem Relation Age of Onset  . Heart disease Father     Before age 62  . Hypertension Father   . Heart attack Father   . Cancer Mother 17    Brain  . Dementia Brother   . Cancer Maternal Aunt 61    breast cancer  . Diabetes Maternal Aunt   . Heart failure Maternal Grandmother   . Diabetes Maternal Grandmother   . Stroke Neg Hx    Family Status  Relation Status  . Father Deceased at age 47   MVA  . Mother Deceased at age 42  . Brother Alive  . Maternal Aunt Alive  . Brother Alive  . Sister Alive  . Daughter Alive  . Son Alive  . Maternal Grandmother   . Neg Hx      Allergies  Allergen Reactions  . Lidocaine Anaphylaxis,  Swelling, Rash and Other (See Comments)    Any of the " [facility] "  . Other Other (See Comments)    All drugs that end in "cane'-  novacane etc. Daughter states patient almost died when she had it when she was born    Medications: Patient's Medications  New Prescriptions   No medications on file  Previous Medications   ALBUTEROL (PROVENTIL HFA;VENTOLIN HFA) 108 (90 BASE) MCG/ACT INHALER    Inhale 2 puffs TID x 1 week then BID x 1 wk then 1 time daily x 1 wk for acute COPD flare then use q6hrs prn wheeze or shortness of breath   ARTIFICIAL TEAR OP    Place 2 drops into both eyes daily.    ASPIRIN 81 MG TABLET    Take 81 mg by mouth daily.   ATORVASTATIN (LIPITOR) 20 MG TABLET    take 1 tablet by mouth once daily   AZITHROMYCIN (ZITHROMAX) 250 MG TABLET    Take 2 tabs po today then 1 tab po daily x 4 days   B COMPLEX VITAMINS CAPSULE    Take 1 capsule by mouth daily.   BYSTOLIC 5 MG TABLET    take 1 tablet by mouth once daily   CHOLECALCIFEROL (VITAMIN D) 2000 UNITS CAPS    Take 1 capsule by mouth daily.   CINNAMON PO    Take 2,000 mg by mouth daily.   DONEPEZIL (ARICEPT) 10 MG TABLET    Take 1 tablet (10 mg total) by mouth at bedtime.   FLUTICASONE FUROATE-VILANTEROL (BREO ELLIPTA) 100-25 MCG/INH AEPB    Inhale 1 puff into the lungs daily.   FUROSEMIDE (LASIX) 40 MG TABLET    Take 1 tablet (40 mg total) by mouth daily.   GUAIFENESIN (MUCINEX) 600 MG 12 HR TABLET    Take 600 mg by mouth 2 (two) times daily.   IPRATROPIUM (ATROVENT) 0.06 % NASAL SPRAY    Place 2 sprays into both nostrils 2 (two) times daily.    MEMANTINE (NAMENDA) 10 MG TABLET    Take 1 tablet (10 mg total) by mouth 2 (two) times daily.  MULTIPLE VITAMINS-MINERALS (PRESERVISION AREDS 2) CAPS    Take 1 capsule by mouth daily.   OMEGA-3 300 MG CAPS    Take 1 tablet by mouth daily.   ONDANSETRON (ZOFRAN ODT) 4 MG DISINTEGRATING TABLET    Take 1 tablet (4 mg total) by mouth every 8 (eight) hours as needed for nausea or  vomiting.   PANTOPRAZOLE (PROTONIX) 40 MG TABLET    Take 1 tablet (40 mg total) by mouth daily.   PHENYLEPHRINE (NEO-SYNEPHRINE) 1 % NASAL SPRAY    Place 2 drops into both nostrils 3 (three) times daily.   POTASSIUM 99 MG TABS    Take 1 tablet by mouth daily.   PREDNISONE (DELTASONE) 10 MG TABLET    Take 4 tabs po daily x 2 days then 3 tabs po daily x 2 days then 2 tabs po daily x 2 days then 1 tab po daily x 2 days and stop   PROBIOTIC PRODUCT (PROBIOTIC DAILY PO)    Take 1 capsule by mouth daily.   PSEUDOEPHEDRINE-DM-GG (TUSSIN COLD/COUGH PO)    Take 10 mLs by mouth every 12 (twelve) hours.   SPACER/AERO-HOLDING CHAMBERS (AEROCHAMBER PLUS) INHALER    Use daily as instructed with inhaler [id number]   VAYACOG 100-19.5-6.5 MG CAPS    take 1 capsule by mouth once daily   VOLTAREN 1 % GEL    apply 2 grams to affected area four times a day if needed  Modified Medications   No medications on file  Discontinued Medications   No medications on file    Review of Systems  Unable to perform ROS: Other (memory loss)    Vitals:   09/19/16 1418  BP: 124/68  Pulse: 68  Temp: 98.4 F (36.9 C)  TempSrc: Oral  SpO2: 96%  Weight: 189 lb (85.7 kg)  Height: 5\' 3"  (1.6 m)   Body mass index is 33.48 kg/m.  Physical Exam  Constitutional: She appears well-developed and well-nourished.  Looks uncomfortable in NAD  HENT:  Head: Normocephalic and atraumatic.  Mouth/Throat: Oropharynx is clear and moist. No oropharyngeal exudate.  Eyes: Pupils are equal, round, and reactive to light. No scleral icterus.  Neck: Neck supple.  Cardiovascular: Normal rate, regular rhythm and intact distal pulses.  Exam reveals no gallop and no friction rub.   Murmur (1/6 SEM) heard. Trace LE edema b/l. No calf TTP  Pulmonary/Chest: Effort normal. No respiratory distress. She has no wheezes (prolonged expirtaory phase). She has no rales.  Reduced BS at base b/l  Abdominal: Soft. Bowel sounds are normal. She exhibits no  distension and no mass. There is no tenderness. There is no rebound and no guarding.  Musculoskeletal: She exhibits edema and tenderness.       Back:  Right knee swelling posteriorly with TTP but no distinct mass palpable; reduced flexion/extension of knee; gait antalgic  Lymphadenopathy:    She has no cervical adenopathy.  Neurological: She is alert.  Skin: Skin is warm and dry. No rash noted.  Contusion noted right pelvic/buttock, TTP but no secondary signs of infection  Psychiatric: She has a normal mood and affect. Her behavior is normal.     Labs reviewed: Lab on 09/05/2016  Component Date Value Ref Range Status  . Sodium 09/05/2016 141  135 - 146 mmol/L Final  . Potassium 09/05/2016 4.1  3.5 - 5.3 mmol/L Final  . Chloride 09/05/2016 104  98 - 110 mmol/L Final  . CO2 09/05/2016 27  20 - 31 mmol/L Final  .  Glucose, Bld 09/05/2016 72  65 - 99 mg/dL Final  . BUN 09/05/2016 29* 7 - 25 mg/dL Final  . Creat 09/05/2016 1.20* 0.60 - 0.93 mg/dL Final   Comment:   For patients > or = 78 years of age: The upper reference limit for Creatinine is approximately 13% higher for people identified as African-American.     . Calcium 09/05/2016 8.7  8.6 - 10.4 mg/dL Final  . Brain Natriuretic Peptide 09/06/2016 130.7* <100 pg/mL Final   Comment:   BNP levels increase with age in the general population with the highest values seen in individuals greater than 20 years of age. Reference: Joellyn Rued Cardiol 2002; [id number].     Lab on 07/23/2016  Component Date Value Ref Range Status  . Sodium 07/23/2016 142  135 - 146 mmol/L Final  . Potassium 07/23/2016 4.2  3.5 - 5.3 mmol/L Final  . Chloride 07/23/2016 102  98 - 110 mmol/L Final  . CO2 07/23/2016 27  20 - 31 mmol/L Final  . Glucose, Bld 07/23/2016 130* 65 - 99 mg/dL Final  . BUN 07/23/2016 23  7 - 25 mg/dL Final  . Creat 07/23/2016 1.33* 0.60 - 0.93 mg/dL Final   Comment:   For patients > or = 78 years of age: The upper reference limit  for Creatinine is approximately 13% higher for people identified as African-American.     . Calcium 07/23/2016 9.0  8.6 - 10.4 mg/dL Final    No results found.   Assessment/Plan   ICD-9-CM ICD-10-CM   1. Contusion of pelvic region, initial encounter 922.9 S30.0XXA   2. Right knee pain, unspecified chronicity - r/o fx 719.46 M25.561 DG Knee Complete 4 Views Right  3. Acute right-sided low back pain without sciatica - r/o fx 724.2 M54.5 DG Lumbar Spine Complete     DG Sacrum/Coccyx     XR Pelvis 1-2 Views  4. Other seasonal allergic rhinitis 477.8 J30.2 Ambulatory referral to Allergy  5. COPD exacerbation ([place]) - improving 491.21 J44.1   6. Memory disorder 780.93 R41.3   7. Fall in home, initial encounter [phone] W19.XXXA DG Knee Complete 4 Views Right   E849.0 Y92.099 DG Lumbar Spine Complete     DG Sacrum/Coccyx     XR Pelvis 1-2 Views    She does not meet criteria for home oxygen therapy at this time (needs <89% on room air)  Finish antibiotic as ordered  Finish prednisone therapy. May resume Breo at completion of prednisone  Follow up with pulmonary as scheduled  Will call with referral to allergist/asthma  RESUME CLARITIN 10mg  daily for seasonal allergy  Apply cool compress to bruised area over right pelvis/buttock  Follow up as scheduled  Ariannie Penaloza S. Perlie Gold  [facility] [address] [place], [place] [address] [phone] Cell (Monday-Friday 8 AM - 5 PM) [phone] After 5 PM and follow prompts

## 2016-10-08 ENCOUNTER — Encounter: Payer: Self-pay | Admitting: Internal Medicine

## 2016-10-09 ENCOUNTER — Encounter: Payer: Self-pay | Admitting: *Deleted

## 2016-10-09 DIAGNOSIS — L570 Actinic keratosis: Secondary | ICD-10-CM | POA: Diagnosis not present

## 2016-11-04 ENCOUNTER — Encounter: Payer: Self-pay | Admitting: Internal Medicine

## 2016-11-04 ENCOUNTER — Ambulatory Visit (INDEPENDENT_AMBULATORY_CARE_PROVIDER_SITE_OTHER): Payer: Medicare Other | Admitting: Internal Medicine

## 2016-11-04 VITALS — BP 90/60 | HR 92 | Ht 63.5 in | Wt 188.0 lb

## 2016-11-04 DIAGNOSIS — K219 Gastro-esophageal reflux disease without esophagitis: Secondary | ICD-10-CM | POA: Diagnosis not present

## 2016-11-04 DIAGNOSIS — R112 Nausea with vomiting, unspecified: Secondary | ICD-10-CM | POA: Diagnosis not present

## 2016-11-04 MED ORDER — PANTOPRAZOLE SODIUM 40 MG PO TBEC: 40.0000 mg | DELAYED_RELEASE_TABLET | Freq: Every day | ORAL | 0 refills | Status: DC

## 2016-11-04 MED ORDER — ONDANSETRON 4 MG PO TBDP: 4.0000 mg | ORAL_TABLET | ORAL | 2 refills | Status: DC

## 2016-11-04 MED ORDER — ONDANSETRON 4 MG PO TBDP: 4.0000 mg | ORAL_TABLET | ORAL | 0 refills | Status: DC

## 2016-11-04 NOTE — Patient Instructions (Addendum)
Continue pantoprazole 40 mg every morning.  Change your ranitidine to evening dosing.  Take your zofran 4 mg every morning on a scheduled basis.  You have been scheduled for an Upper GI Series at [facility]. Your appointment is on 11/11/16  at 10:00 am. Please arrive 15 minutes prior to your test for registration. Make sure not to eat or drink anything after midnight on the night before your test. If you need to reschedule, please call radiology at [phone]. ________________________________________________________________ An upper GI series uses x rays to help diagnose problems of the upper GI tract, which includes the esophagus, stomach, and duodenum. The duodenum is the first part of the small intestine. An upper GI series is conducted by a radiology technologist or a radiologist-a doctor who specializes in x-ray imaging-at a hospital or outpatient center. While sitting or standing in front of an x-ray machine, the patient drinks barium liquid, which is often white and has a chalky consistency and taste. The barium liquid coats the lining of the upper GI tract and makes signs of disease show up more clearly on x rays. X-ray video, called fluoroscopy, is used to view the barium liquid moving through the esophagus, stomach, and duodenum. Additional x rays and fluoroscopy are performed while the patient lies on an x-ray table. To fully coat the upper GI tract with barium liquid, the technologist or radiologist may press on the abdomen or ask the patient to change position. Patients hold still in various positions, allowing the technologist or radiologist to take x rays of the upper GI tract at different angles. If a technologist conducts the upper GI series, a radiologist will later examine the images to look for problems.  This test typically takes about 1 hour to complete. __________________________________________________________________ If you are age 66 or older, your body mass  index should be between 23-30. Your Body mass index is 32.78 kg/m. If this is out of the aforementioned range listed, please consider follow up with your Primary Care Provider.  If you are age 10 or younger, your body mass index should be between 19-25. Your Body mass index is 32.78 kg/m. If this is out of the aformentioned range listed, please consider follow up with your Primary Care Provider.

## 2016-11-05 ENCOUNTER — Telehealth: Payer: Self-pay | Admitting: *Deleted

## 2016-11-05 NOTE — Telephone Encounter (Signed)
Sponge bath could work. This is common with disease progression of dementia, if she needs resources there are some at the office the family can pick up or she can read online. Also the book 36 hour day is a good read for these patients family members

## 2016-11-05 NOTE — Progress Notes (Signed)
Subjective:    Patient ID: Virginia [name], female    DOB: 11/30/1936, 80 y.o.   MRN: [id number]  HPI Virginia [name] is a 80 year old female with history of Alzheimer's dementia, COPD, hypertension, hyperlipidemia who is seen in follow-up for ongoing nausea with intermittent vomiting. She is here today with her brother and son. I saw her initially in August 2017, though by that time her nausea improved with dietary modification. At that time discontinued her pantoprazole in favor of ranitidine 150 mg twice daily.  Today the patient reports she is feeling well and very much minimizes her symptoms. She reports not having significant nausea or vomiting. Denies abdominal pain. Reports normal bowel movements and appetite. Her son and brother only report that she is doing with usually nausea each morning and occasional vomiting. Patient has been using Zofran when symptoms are most severe. Her pantoprazole was restarted by primary care and she's also been using ranitidine in the morning and sometimes night. Her brother endorses a very healthy appetite and there is no report of early satiety. All of her nausea and regurgitation symptoms are worse in the morning.  Review of Systems As per history of present illness, otherwise negative  Current Medications, Allergies, Past Medical History, Past Surgical History, Family History and Social History were reviewed in [employer] record.     Objective:   Physical Exam BP 90/60 (BP Location: Left Arm, Patient Position: Sitting, Cuff Size: Normal)   Pulse 92   Ht 5' 3.5" (1.613 m)   Wt 188 lb (85.3 kg)   BMI 32.78 kg/m  Constitutional: Well-developed and well-nourished. No distress. HEENT: Normocephalic and atraumatic. Oropharynx is clear and moist. No oropharyngeal exudate. Conjunctivae are normal.  No scleral icterus. Neck: Neck supple. Trachea midline. Cardiovascular: Normal rate, regular rhythm and intact distal pulses.    Pulmonary/chest: Effort normal and breath sounds normal.  Abdominal: Soft, obese, nontender, nondistended. Bowel sounds active throughout. Extremities: no clubbing, cyanosis, or edema, Changes of venous stasis bilateral lower extremities Lymphadenopathy: No cervical adenopathy noted. Neurological: Alert and oriented to person place and time. Skin: Skin is warm and dry. No rashes noted. Psychiatric: Normal mood and affect. Behavior is normal.  CBC    Component Value Date/Time   WBC 9.1 01/31/2016 1520   RBC 4.93 01/31/2016 1520   HGB 13.9 01/31/2016 1520   HCT 41.8 01/31/2016 1520   HCT CANCELED 01/17/2016 1606   PLT 239.0 01/31/2016 1520   PLT CANCELED 01/17/2016 1606   MCV 84.8 01/31/2016 1520   MCV 87 08/16/2015 1552   MCH 29.0 01/28/2016 1510   MCHC 33.2 01/31/2016 1520   RDW 14.6 01/31/2016 1520   RDW 14.1 08/16/2015 1552   LYMPHSABS 1.0 01/31/2016 1520   LYMPHSABS CANCELED 01/17/2016 1606   MONOABS 0.8 01/31/2016 1520   EOSABS 0.1 01/31/2016 1520   EOSABS CANCELED 01/17/2016 1606   BASOSABS 0.0 01/31/2016 1520   BASOSABS CANCELED 01/17/2016 1606   CMP     Component Value Date/Time   NA 141 09/05/2016 1537   NA 149 (H) 01/17/2016 1606   K 4.1 09/05/2016 1537   CL 104 09/05/2016 1537   CO2 27 09/05/2016 1537   GLUCOSE 72 09/05/2016 1537   BUN 29 (H) 09/05/2016 1537   BUN 24 01/17/2016 1606   CREATININE 1.20 (H) 09/05/2016 1537   CALCIUM 8.7 09/05/2016 1537   PROT 6.4 12/29/2014 1242   ALBUMIN 3.4 (L) 12/29/2014 1242   AST 28 08/01/2015 0857  ALT 17 12/29/2014 1242   ALKPHOS 68 12/29/2014 1242   BILITOT 0.9 12/29/2014 1242   GFRNONAA 53 (L) 01/17/2016 1606   GFRAA 61 01/17/2016 1606       Assessment & Plan:  80 year old female with history of Alzheimer's dementia, COPD, hypertension, hyperlipidemia who is seen in follow-up for ongoing nausea with intermittent vomiting.   1. Nausea with occasional vomiting -- Patient appears well and minimizes her  symptoms, though this minimization may be in part due to memory deficits. It seems she does better on pantoprazole and we will continue 40 mg each morning. I advised that they move ranitidine to evening only at 150 mg. I've also recommended that she taks Zofran on schedule 4 milligrams each morning to prevent nausea. She can use additional doses every 6-8 hours as needed. --Given persistent symptoms we discussed upper endoscopy. The patient prefers a less invasive approach after discussion we will proceed with barium esophagram with upper GI series. --I will see her back in 2-3 months, sooner necessary  25 minutes spent with the patient today. Greater than 50% was spent in counseling and coordination of care with the patient

## 2016-11-05 NOTE — Telephone Encounter (Signed)
Patient daughter, Virginia [name] called and stated that patient will not bathe and very resistant. Daughter is worried about skin breakdown and hygiene. Would like Advice on what she could do. Please Advise.

## 2016-11-05 NOTE — Telephone Encounter (Signed)
Daughter notified and agreed. She wonders if something could be prescribed to help calm her, nothing that would make her fall. She gets real aggressive with daughter and caregivers. Please Advise.

## 2016-11-06 NOTE — Telephone Encounter (Signed)
Would recommend her getting some educational material (36 hour day and we have information in the office) this helps caregivers on how to approach patients with Dementia. This is all part of the memory loss. Could possible start something for depression/anxiety if she felt like this was a problem.

## 2016-11-10 NOTE — Telephone Encounter (Signed)
[name], daughter stated that she is Terribly disappointment on medical advise. Stated A book helps but does not help with her mother now. Needs something that will help calm patient so she is more rationale. Stated that if medication wasn't needed and just a book why even go to the Doctor? Daughter is at her wits end with flying back and forth.  Please Advise.

## 2016-11-10 NOTE — Telephone Encounter (Signed)
I am sorry if she finds our advice unhelpful, the book was to help daughter understand progression of dementia; if she would like medication prescribed we can see pt in office and speak with her over the phone to help symptoms

## 2016-11-10 NOTE — Telephone Encounter (Signed)
Daughter notified and appointment scheduled.

## 2016-11-11 ENCOUNTER — Ambulatory Visit (HOSPITAL_COMMUNITY): Payer: Medicare Other

## 2016-11-12 ENCOUNTER — Ambulatory Visit (INDEPENDENT_AMBULATORY_CARE_PROVIDER_SITE_OTHER): Payer: Medicare Other | Admitting: Neurology

## 2016-11-12 ENCOUNTER — Encounter: Payer: Self-pay | Admitting: Neurology

## 2016-11-12 VITALS — BP 134/81 | HR 84 | Ht 63.5 in | Wt 189.0 lb

## 2016-11-12 DIAGNOSIS — R413 Other amnesia: Secondary | ICD-10-CM

## 2016-11-12 MED ORDER — MEMANTINE HCL 10 MG PO TABS
10.0000 mg | ORAL_TABLET | Freq: Two times a day (BID) | ORAL | 3 refills | Status: DC
Start: 2016-11-12 — End: 2018-01-18

## 2016-11-12 MED ORDER — DONEPEZIL HCL 10 MG PO TABS: 10.0000 mg | ORAL_TABLET | Freq: Every day | ORAL | 3 refills | Status: DC

## 2016-11-12 NOTE — Progress Notes (Signed)
Reason for visit: Memory disturbance  Virginia [name] is an 80 y.o. female  History of present illness:  Virginia [name] is a 80 year old right-handed white female with a history of a progressive memory disturbance. The patient currently lives with her brother. The brother does the driving, the patient does not operate a motor vehicle. The brother will handle the cooking, he keeps track of the medications and appointments, and he does the finances. The patient has had some slight decline in her memory since last seen, she is on Aricept and Namenda. She sleeps well at night, but she has been followed through gastroenterology for intermittent episodes of nausea in the morning. This has been going on for least 6 or 7 months. The patient fortunately is not losing weight. The patient returns to this office for an evaluation.  Past Medical History:  Diagnosis Date  . Carotid artery occlusion   . CKD (chronic kidney disease)   . COPD (chronic obstructive pulmonary disease) ([place])   . Diverticulosis   . Fall at home Sept. 2013  . Hyperlipidemia   . Hypertension   . Internal hemorrhoid   . Memory disorder 10/24/2014    Past Surgical History:  Procedure Laterality Date  . APPENDECTOMY    . BREAST REDUCTION SURGERY    . CAROTID ENDARTERECTOMY     left CEA  . CATARACT EXTRACTION Bilateral   . COLONOSCOPY  2015  . EYE SURGERY     cataracts removed, bilaterally   . skin cancer removal    . TONSILLECTOMY      Family History  Problem Relation Age of Onset  . Heart disease Father     Before age 24  . Hypertension Father   . Heart attack Father   . Cancer Mother 74    Brain  . Dementia Brother   . Cancer Maternal Aunt 61    breast cancer  . Diabetes Maternal Aunt   . Heart failure Maternal Grandmother   . Diabetes Maternal Grandmother   . Stroke Neg Hx     Social history:  reports that she quit smoking about 21 years ago. Her smoking use included Cigarettes. She quit after 14.00 years  of use. She has never used smokeless tobacco. She reports that she does not drink alcohol or use drugs.    Allergies  Allergen Reactions  . Lidocaine Anaphylaxis, Swelling, Rash and Other (See Comments)    Any of the " [facility] "  . Other Other (See Comments)    All drugs that end in "cane'-  novacane etc. Daughter states patient almost died when she had it when she was born    Medications:  Prior to Admission medications   Medication Sig Start Date End Date Taking? Authorizing Provider  ARTIFICIAL TEAR OP Place 2 drops into both eyes daily.    Yes Historical Provider, MD  aspirin 81 MG tablet Take 81 mg by mouth daily.   Yes Historical Provider, MD  atorvastatin (LIPITOR) 20 MG tablet take 1 tablet by mouth once daily 05/27/16  Yes Fay Records, MD  b complex vitamins capsule Take 1 capsule by mouth daily.   Yes Historical Provider, MD  BYSTOLIC 5 MG tablet take 1 tablet by mouth once daily 02/19/16  Yes Fay Records, MD  Cholecalciferol (VITAMIN D) 2000 UNITS CAPS Take 1 capsule by mouth daily.   Yes Historical Provider, MD  donepezil (ARICEPT) 10 MG tablet Take 1 tablet (10 mg total) by  mouth at bedtime. 05/12/16  Yes Dennie Bible, NP  fluticasone furoate-vilanterol (BREO ELLIPTA) 100-25 MCG/INH AEPB Inhale 1 puff into the lungs daily. 02/19/16  Yes Tammy S Parrett, NP  furosemide (LASIX) 40 MG tablet Take 1 tablet (40 mg total) by mouth daily. 07/16/16  Yes Fay Records, MD  ipratropium (ATROVENT) 0.06 % nasal spray Place 2 sprays into both nostrils 2 (two) times daily.  04/29/16  Yes Historical Provider, MD  memantine (NAMENDA) 10 MG tablet Take 1 tablet (10 mg total) by mouth 2 (two) times daily. 05/12/16  Yes Dennie Bible, NP  Multiple Vitamins-Minerals (PRESERVISION AREDS 2) CAPS Take 1 capsule by mouth daily.   Yes Historical Provider, MD  ondansetron (ZOFRAN ODT) 4 MG disintegrating tablet Take 1 tablet (4 mg total) by mouth every morning. 11/04/16  Yes Jerene Bears,  MD  pantoprazole (PROTONIX) 40 MG tablet Take 1 tablet (40 mg total) by mouth daily. 11/04/16  Yes Jerene Bears, MD  phenylephrine (NEO-SYNEPHRINE) 1 % nasal spray Place 2 drops into both nostrils 3 (three) times daily.   Yes Historical Provider, MD  VAYACOG 100-19.5-6.5 MG CAPS take 1 capsule by mouth once daily 07/15/16  Yes Kathrynn Ducking, MD  VOLTAREN 1 % GEL apply 2 grams to affected area four times a day if needed 08/18/16  Yes Historical Provider, MD    ROS:  Out of a complete 14 system review of symptoms, the patient complains only of the following symptoms, and all other reviewed systems are negative.  Runny nose Daytime sleepiness Joint pain, walking difficulty Memory loss, dizziness  Blood pressure 134/81, pulse 84, height 5' 3.5" (1.613 m), weight 189 lb (85.7 kg).  Physical Exam  General: The patient is alert and cooperative at the time of the examination. The patient is moderately to markedly obese.  Skin: No significant peripheral edema is noted.   Neurologic Exam  Mental status: The patient is alert and oriented x 2 at the time of the examination (not oriented to date). The Mini-Mental Status Examination done today shows a total score of 20/30.   Cranial nerves: Facial symmetry is present. Speech is normal, no aphasia or dysarthria is noted. Extraocular movements are full. Visual fields are full.  Motor: The patient has good strength in all 4 extremities.  Sensory examination: Soft touch sensation is symmetric on the face, arms, and legs.  Coordination: The patient has good finger-nose-finger and heel-to-shin bilaterally. Some mild apraxia with the use of the lower extremities is noted.  Gait and station: The patient has a normal gait. Tandem gait is unsteady. Romberg is negative. No drift is seen.  Reflexes: Deep tendon reflexes are symmetric.   Assessment/Plan:  1. Progressive memory disturbance  2. Early morning nausea  The episodes of nausea may be  related to the use of Aricept at night. I have asked the family to cut the 10 mg tablet in half and use one half tablet twice daily. If the nausea continues, the family is to cut back to 5 mg in the evening. She will follow-up in 6 months, sooner if needed. A prescription was called in for the Aricept and for the Namenda.  Jill Alexanders MD 11/12/2016 3:48 PM  [facility] [address] [place], [place] [address]  Phone [phone] Fax [phone]

## 2016-11-12 NOTE — Patient Instructions (Signed)
   With the donepezil 10 mg , try taking 1/2 tablet twice a day to see if the nausea in the morning improves.

## 2016-11-20 ENCOUNTER — Encounter: Payer: Self-pay | Admitting: Nurse Practitioner

## 2016-11-20 ENCOUNTER — Ambulatory Visit (INDEPENDENT_AMBULATORY_CARE_PROVIDER_SITE_OTHER): Payer: Medicare Other | Admitting: Nurse Practitioner

## 2016-11-20 ENCOUNTER — Ambulatory Visit: Payer: Self-pay | Admitting: Nurse Practitioner

## 2016-11-20 ENCOUNTER — Other Ambulatory Visit: Payer: Self-pay | Admitting: *Deleted

## 2016-11-20 VITALS — BP 150/90 | HR 68 | Temp 98.0°F | Resp 17 | Ht 64.0 in | Wt 192.8 lb

## 2016-11-20 DIAGNOSIS — J3489 Other specified disorders of nose and nasal sinuses: Secondary | ICD-10-CM | POA: Diagnosis not present

## 2016-11-20 DIAGNOSIS — I6523 Occlusion and stenosis of bilateral carotid arteries: Secondary | ICD-10-CM | POA: Diagnosis not present

## 2016-11-20 DIAGNOSIS — F0391 Unspecified dementia with behavioral disturbance: Secondary | ICD-10-CM | POA: Diagnosis not present

## 2016-11-20 DIAGNOSIS — F419 Anxiety disorder, unspecified: Secondary | ICD-10-CM | POA: Diagnosis not present

## 2016-11-20 DIAGNOSIS — Z48812 Encounter for surgical aftercare following surgery on the circulatory system: Secondary | ICD-10-CM

## 2016-11-20 MED ORDER — FLUOXETINE HCL 20 MG PO TABS: 20.0000 mg | ORAL_TABLET | Freq: Every day | ORAL | 1 refills | Status: DC

## 2016-11-20 NOTE — Patient Instructions (Addendum)
Will start prozac 20 mg daily for mood    Virginia [name] for helpful hits   Follow up in 4 weeks on mood-- blood work prior to visit
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
## 2016-11-21 ENCOUNTER — Other Ambulatory Visit: Payer: Self-pay | Admitting: Internal Medicine

## 2016-11-21 ENCOUNTER — Encounter: Payer: Self-pay | Admitting: Family

## 2016-11-25 ENCOUNTER — Ambulatory Visit (HOSPITAL_COMMUNITY)
Admission: RE | Admit: 2016-11-25 | Discharge: 2016-11-25 | Disposition: A | Discharge status: Home / Self Care | Payer: Medicare Other | Source: Ambulatory Visit | Attending: Vascular Surgery | Admitting: Vascular Surgery

## 2016-11-25 ENCOUNTER — Ambulatory Visit: Payer: Medicare Other | Admitting: Family

## 2016-11-25 DIAGNOSIS — Z48812 Encounter for surgical aftercare following surgery on the circulatory system: Secondary | ICD-10-CM | POA: Diagnosis not present

## 2016-11-25 DIAGNOSIS — I6523 Occlusion and stenosis of bilateral carotid arteries: Secondary | ICD-10-CM | POA: Diagnosis not present

## 2016-11-25 LAB — VAS US CAROTID
LCCAPDIAS: 16 cm/s
LCCAPSYS: 72 cm/s
LEFT ECA DIAS: 0 cm/s
Left CCA dist dias: 18 cm/s
Left CCA dist sys: 82 cm/s
Left ICA dist dias: -22 cm/s
Left ICA dist sys: -72 cm/s
Left ICA prox dias: 20 cm/s
Left ICA prox sys: 111 cm/s
RCCAPDIAS: 14 cm/s
RIGHT CCA MID DIAS: 10 cm/s
RIGHT ECA DIAS: -5 cm/s
Right CCA prox sys: 100 cm/s
Right cca dist sys: -66 cm/s

## 2016-12-02 ENCOUNTER — Ambulatory Visit
Admission: RE | Admit: 2016-12-02 | Discharge: 2016-12-02 | Disposition: A | Discharge status: Home / Self Care | Payer: Medicare Other | Source: Ambulatory Visit | Attending: Nurse Practitioner | Admitting: Nurse Practitioner

## 2016-12-02 ENCOUNTER — Encounter: Payer: Self-pay | Admitting: Nurse Practitioner

## 2016-12-02 ENCOUNTER — Ambulatory Visit (INDEPENDENT_AMBULATORY_CARE_PROVIDER_SITE_OTHER): Payer: Medicare Other | Admitting: Nurse Practitioner

## 2016-12-02 ENCOUNTER — Telehealth: Payer: Self-pay

## 2016-12-02 VITALS — BP 148/88 | HR 84 | Temp 98.4°F | Resp 17 | Ht 64.0 in | Wt 185.0 lb

## 2016-12-02 DIAGNOSIS — J209 Acute bronchitis, unspecified: Secondary | ICD-10-CM | POA: Diagnosis not present

## 2016-12-02 DIAGNOSIS — R05 Cough: Secondary | ICD-10-CM | POA: Diagnosis not present

## 2016-12-02 MED ORDER — ALBUTEROL SULFATE (2.5 MG/3ML) 0.083% IN NEBU: 2.5000 mg | INHALATION_SOLUTION | Freq: Four times a day (QID) | RESPIRATORY_TRACT | 1 refills | Status: DC | PRN

## 2016-12-02 MED ORDER — PREDNISONE 20 MG PO TABS: 20.0000 mg | ORAL_TABLET | Freq: Every day | ORAL | 0 refills | Status: DC

## 2016-12-02 NOTE — Patient Instructions (Signed)
mucinex DM by mouth twice daily for 7 days with full glass of water Albuterol via nebulizer every 6 hours while awake x 3 days then as needed  Prednisone 40 mg daily for 5 days Cont Azithromycin until complete Will get chest xray today to rule out pneumonia   Acute Bronchitis, Adult Acute bronchitis is when air tubes (bronchi) in the lungs suddenly get swollen. The condition can make it hard to breathe. It can also cause these symptoms:  A cough.  Coughing up clear, yellow, or green mucus.  Wheezing.  Chest congestion.  Shortness of breath.  A fever.  Body aches.  Chills.  A sore throat. Follow these instructions at home: Medicines  Take over-the-counter and prescription medicines only as told by your doctor.  If you were prescribed an antibiotic medicine, take it as told by your doctor. Do not stop taking the antibiotic even if you start to feel better. General instructions  Rest.  Drink enough fluids to keep your pee (urine) clear or pale yellow.  Avoid smoking and secondhand smoke. If you smoke and you need help quitting, ask your doctor. Quitting will help your lungs heal faster.  Use an inhaler, cool mist vaporizer, or humidifier as told by your doctor.  Keep all follow-up visits as told by your doctor. This is important. How is this prevented? To lower your risk of getting this condition again:  Wash your hands often with soap and water. If you cannot use soap and water, use hand sanitizer.  Avoid contact with people who have cold symptoms.  Try not to touch your hands to your mouth, nose, or eyes.  Make sure to get the flu shot every year. Contact a doctor if:  Your symptoms do not get better in 2 weeks. Get help right away if:  You cough up blood.  You have chest pain.  You have very bad shortness of breath.  You become dehydrated.  You faint (pass out) or keep feeling like you are going to pass out.  You keep throwing up (vomiting).  You  have a very bad headache.  Your fever or chills gets worse. This information is not intended to replace advice given to you by your health care provider. Make sure you discuss any questions you have with your health care provider. Document Released: 05/05/2008 Document Revised: 06/25/2016 Document Reviewed: 05/07/2016 Elsevier Interactive Patient Education  2017 [employer].

## 2016-12-02 NOTE — Progress Notes (Signed)
Careteam: Patient Care Team: Lauree Chandler, NP as PCP - General (Nurse Practitioner) Fay Records, MD as Consulting Physician (Cardiology) Tanda Rockers, MD as Consulting Physician (Pulmonary Disease)   Allergies  Allergen Reactions  . Lidocaine Anaphylaxis, Swelling, Rash and Other (See Comments)    Any of the " [facility] "  . Other Other (See Comments)    All drugs that end in "cane'-  novacane etc. Daughter states patient almost died when she had it when she was born    No chief complaint on file.    HPI: Patient is a 81 y.o. female seen in the office today due to not feeling well for 5 days and has progressively gotten worse.  Brother here with her providing most of the hx due to dementia. Yesterday called office and got on call provider. Was prescribed azithromycin. Took  500 mg yesterday will take 250 mg daily for 4 days.  Using rescue inhaler however question if it is being administrated correctly however it is helping some.  Feeling better today but since this has started she has had   Decrease in appetite and water intake.  Reports nasal and chest congestion Has cough, nonproductive but very congestion, using robitussin. Had some old cough syrup with codeine that helped.  No fever that she is aware of but they have not taken her temp.  Currently without shortness of breath. Was having shortness of breath prior to using albuterol inhaler.    Review of Systems:  Review of Systems  Constitutional: Positive for activity change and appetite change. Negative for chills and fever.  HENT: Positive for congestion, postnasal drip, rhinorrhea and sinus pressure. Negative for ear pain, hearing loss, sinus pain and sore throat.   Respiratory: Positive for cough, shortness of breath and wheezing.   Cardiovascular: Negative for chest pain and palpitations.  Gastrointestinal: Negative for abdominal pain, constipation and diarrhea.  Neurological: Positive for weakness.  Negative for headaches.   Past Medical History:  Diagnosis Date  . Carotid artery occlusion   . CKD (chronic kidney disease)   . COPD (chronic obstructive pulmonary disease) ([place])   . Diverticulosis   . Fall at home Sept. 2013  . Hyperlipidemia   . Hypertension   . Internal hemorrhoid   . Memory disorder 10/24/2014   Past Surgical History:  Procedure Laterality Date  . APPENDECTOMY    . BREAST REDUCTION SURGERY    . CAROTID ENDARTERECTOMY     left CEA  . CATARACT EXTRACTION Bilateral   . COLONOSCOPY  2015  . EYE SURGERY     cataracts removed, bilaterally   . skin cancer removal    . TONSILLECTOMY     Social History:   reports that she quit smoking about 22 years ago. Her smoking use included Cigarettes. She quit after 14.00 years of use. She has never used smokeless tobacco. She reports that she does not drink alcohol or use drugs.  Family History  Problem Relation Age of Onset  . Heart disease Father     Before age 40  . Hypertension Father   . Heart attack Father   . Cancer Mother 59    Brain  . Dementia Brother   . Cancer Maternal Aunt 61    breast cancer  . Diabetes Maternal Aunt   . Heart failure Maternal Grandmother   . Diabetes Maternal Grandmother   . Stroke Neg Hx     Medications: Patient's Medications  New Prescriptions  No medications on file  Previous Medications   ARTIFICIAL TEAR OP    Place 2 drops into both eyes daily.    ASPIRIN 81 MG TABLET    Take 81 mg by mouth daily.   ATORVASTATIN (LIPITOR) 20 MG TABLET    take 1 tablet by mouth once daily   B COMPLEX VITAMINS CAPSULE    Take 1 capsule by mouth daily.   BYSTOLIC 5 MG TABLET    take 1 tablet by mouth once daily   CHOLECALCIFEROL (VITAMIN D) 2000 UNITS CAPS    Take 1 capsule by mouth daily.   DONEPEZIL (ARICEPT) 5 MG TABLET    Take 5 mg by mouth 2 (two) times daily.   FLUOXETINE (PROZAC) 20 MG TABLET    Take 1 tablet (20 mg total) by mouth daily.   FLUTICASONE FUROATE-VILANTEROL (BREO  ELLIPTA) 100-25 MCG/INH AEPB    Inhale 1 puff into the lungs daily.   FUROSEMIDE (LASIX) 40 MG TABLET    take 1 tablet by mouth once daily   IPRATROPIUM (ATROVENT) 0.06 % NASAL SPRAY    Place 2 sprays into both nostrils 2 (two) times daily.    MEMANTINE (NAMENDA) 10 MG TABLET    Take 1 tablet (10 mg total) by mouth 2 (two) times daily.   MULTIPLE VITAMINS-MINERALS (PRESERVISION AREDS 2) CAPS    Take 1 capsule by mouth daily.   ONDANSETRON (ZOFRAN ODT) 4 MG DISINTEGRATING TABLET    Take 1 tablet (4 mg total) by mouth every morning.   PANTOPRAZOLE (PROTONIX) 40 MG TABLET    Take 1 tablet (40 mg total) by mouth daily.   PHENYLEPHRINE (NEO-SYNEPHRINE) 1 % NASAL SPRAY    Place 2 drops into both nostrils 3 (three) times daily.   VAYACOG 100-19.5-6.5 MG CAPS    take 1 capsule by mouth once daily   VOLTAREN 1 % GEL    apply 2 grams to affected area four times a day if needed  Modified Medications   No medications on file  Discontinued Medications   No medications on file     Physical Exam:  There were no vitals filed for this visit. There is no height or weight on file to calculate BMI.  Physical Exam  Constitutional: She appears well-developed and well-nourished.  HENT:  Head: Normocephalic and atraumatic.  Right Ear: External ear normal.  Left Ear: External ear normal.  Mouth/Throat: Oropharynx is clear and moist. No oropharyngeal exudate.  Eyes: Conjunctivae and EOM are normal. Pupils are equal, round, and reactive to light.  Neck: Normal range of motion. Neck supple.  Cardiovascular: Normal rate, regular rhythm and normal heart sounds.   Pulmonary/Chest: Effort normal. She has wheezes (throughout).  Abdominal: Soft. Bowel sounds are normal. She exhibits no distension.  Neurological: She is alert.    Labs reviewed: Basic Metabolic Panel:  Recent Labs  06/19/16 1543 07/23/16 1350 09/05/16 1537  NA 143 142 141  K 4.9 4.2 4.1  CL 102 102 104  CO2 32* 27 27  GLUCOSE 87 130* 72   BUN 31* 23 29*  CREATININE 1.24* 1.33* 1.20*  CALCIUM 8.7 9.0 8.7   Liver Function Tests: No results for input(s): AST, ALT, ALKPHOS, BILITOT, PROT, ALBUMIN in the last 8760 hours. No results for input(s): LIPASE, AMYLASE in the last 8760 hours. No results for input(s): AMMONIA in the last 8760 hours. CBC:  Recent Labs  01/17/16 1606 01/28/16 1510 01/31/16 1520  WBC CANCELED 8.1 9.1  NEUTROABS  --  5.3 7.2  HGB  --  13.7 13.9  HCT CANCELED 41.1 41.8  MCV  --  87.1 84.8  PLT CANCELED 249 239.0   Lipid Panel: No results for input(s): CHOL, HDL, LDLCALC, TRIG, CHOLHDL, LDLDIRECT in the last 8760 hours. TSH: No results for input(s): TSH in the last 8760 hours. A1C: Lab Results  Component Value Date   HGBA1C 5.6 04/12/2015     Assessment/Plan 1. Acute bronchitis, unspecified organism -cont azithromycin until complete  - DG Chest 2 View rule out pneumonia  - to start predniSONE (DELTASONE) 20 MG tablet; Take 1 tablet (20 mg total) by mouth daily with breakfast.  Dispense: 10 tablet; Refill: 0 for 5 days -albuterol neb given in office today with good effects; pt to start nebulizer -albuterol neb every 6 hours while awake for wheezing/cough then as needed after 3 days -to notify/seek medical attention if symptoms are not improving or worsening.   Carlos American. Harle Battiest  [facility] [phone] 8 am - 5 pm) [phone] (after hours)

## 2016-12-02 NOTE — Telephone Encounter (Signed)
I called patient's pharmacy to make sure that a prescription for albuterol would be covered by patient's insurance. The out of pocket cost for the albuterol nebulizer solution will be $4.00. I called patient's brother, [name], to inform him of this cost and he was agreeable to paying it.

## 2016-12-03 ENCOUNTER — Other Ambulatory Visit: Payer: Self-pay

## 2016-12-03 DIAGNOSIS — E785 Hyperlipidemia, unspecified: Secondary | ICD-10-CM

## 2016-12-03 DIAGNOSIS — N183 Chronic kidney disease, stage 3 unspecified: Secondary | ICD-10-CM

## 2016-12-05 ENCOUNTER — Ambulatory Visit: Payer: Medicare Other | Admitting: Family

## 2016-12-12 ENCOUNTER — Other Ambulatory Visit: Payer: Medicare Other

## 2016-12-16 ENCOUNTER — Ambulatory Visit: Payer: Medicare Other

## 2016-12-17 ENCOUNTER — Other Ambulatory Visit: Payer: Medicare Other

## 2016-12-18 ENCOUNTER — Ambulatory Visit: Payer: Medicare Other

## 2016-12-18 ENCOUNTER — Ambulatory Visit: Payer: Medicare Other | Admitting: Nurse Practitioner

## 2016-12-23 ENCOUNTER — Encounter: Payer: Self-pay | Admitting: Nurse Practitioner

## 2016-12-23 ENCOUNTER — Ambulatory Visit (INDEPENDENT_AMBULATORY_CARE_PROVIDER_SITE_OTHER): Payer: Medicare Other | Admitting: Nurse Practitioner

## 2016-12-23 VITALS — BP 132/80 | HR 64 | Temp 97.8°F | Resp 18 | Ht 64.0 in | Wt 186.6 lb

## 2016-12-23 DIAGNOSIS — J3489 Other specified disorders of nose and nasal sinuses: Secondary | ICD-10-CM

## 2016-12-23 DIAGNOSIS — I10 Essential (primary) hypertension: Secondary | ICD-10-CM | POA: Diagnosis not present

## 2016-12-23 DIAGNOSIS — F419 Anxiety disorder, unspecified: Secondary | ICD-10-CM | POA: Diagnosis not present

## 2016-12-23 DIAGNOSIS — F0391 Unspecified dementia with behavioral disturbance: Secondary | ICD-10-CM

## 2016-12-23 MED ORDER — IPRATROPIUM BROMIDE 0.06 % NA SOLN: 2.0000 | Freq: Two times a day (BID) | NASAL | 1 refills | Status: DC

## 2016-12-23 NOTE — Progress Notes (Signed)
Careteam: Patient Care Team: Lauree Chandler, NP as PCP - General (Nurse Practitioner) Fay Records, MD as Consulting Physician (Cardiology) Tanda Rockers, MD as Consulting Physician (Pulmonary Disease)  Advanced Directive information Does Patient Have a Medical Advance Directive?: Yes, Type of Advance Directive: [place];Living will  Allergies  Allergen Reactions  . Lidocaine Anaphylaxis, Swelling, Rash and Other (See Comments)    Any of the " [facility] "  . Other Other (See Comments)    All drugs that end in "cane'-  novacane etc. Daughter states patient almost died when she had it when she was born    Chief Complaint  Patient presents with  . Medical Management of Chronic Issues    4 week follow up on mood. Labs were not done.   . Other    Pt currently has 2 Rx for nasal spray, family wants to know if she still needs to be using these.      HPI: Patient is a 81 y.o. female seen in the office today to follow up mood. Pt was seen last month due to worsening behaviors with progressive memory loss. pts daughter has to fly in from [place] to bath her otherwise she will not take a bath. When she does come in she is very agitated and fights with her regarding getting undressed and bathing. Pt was started on prozac daily  Doing well with medication. Pt states she feels like she is doing better than he has in a long time.  Sister gave her a bath this morning and she did very well.  Has missed some follow ups due to being sick.  Was treated for acute bronchitis earlier this month with azithromycin, prednisone and albuterol as needed. Not needing any more albuterol at this time.   Using OTC nasal spray for rhinitis. Has phenylephrine and atrovent Rx but not been using either.   Review of Systems:  Review of Systems  Constitutional: Negative for activity change, appetite change, chills and fever.  HENT: Negative for congestion, ear pain, hearing loss,  postnasal drip, rhinorrhea, sinus pain, sinus pressure and sore throat.   Respiratory: Negative for cough, shortness of breath and wheezing.   Cardiovascular: Negative for chest pain and palpitations.  Gastrointestinal: Negative for abdominal pain, constipation and diarrhea.  Neurological: Negative for weakness and headaches.  Psychiatric/Behavioral: Positive for agitation (improved) and confusion.    Past Medical History:  Diagnosis Date  . Carotid artery occlusion   . CKD (chronic kidney disease)   . COPD (chronic obstructive pulmonary disease) ([place])   . Diverticulosis   . Fall at home Sept. 2013  . Hyperlipidemia   . Hypertension   . Internal hemorrhoid   . Memory disorder 10/24/2014   Past Surgical History:  Procedure Laterality Date  . APPENDECTOMY    . BREAST REDUCTION SURGERY    . CAROTID ENDARTERECTOMY     left CEA  . CATARACT EXTRACTION Bilateral   . COLONOSCOPY  2015  . EYE SURGERY     cataracts removed, bilaterally   . skin cancer removal    . TONSILLECTOMY     Social History:   reports that she quit smoking about 22 years ago. Her smoking use included Cigarettes. She quit after 14.00 years of use. She has never used smokeless tobacco. She reports that she does not drink alcohol or use drugs.  Family History  Problem Relation Age of Onset  . Heart disease Father  Before age 53  . Hypertension Father   . Heart attack Father   . Cancer Mother 77    Brain  . Dementia Brother   . Cancer Maternal Aunt 61    breast cancer  . Diabetes Maternal Aunt   . Heart failure Maternal Grandmother   . Diabetes Maternal Grandmother   . Stroke Neg Hx     Medications: Patient's Medications  New Prescriptions   No medications on file  Previous Medications   ARTIFICIAL TEAR OP    Place 2 drops into both eyes daily.    ASPIRIN 81 MG TABLET    Take 81 mg by mouth daily.   ATORVASTATIN (LIPITOR) 20 MG TABLET    take 1 tablet by mouth once daily   B COMPLEX VITAMINS  CAPSULE    Take 1 capsule by mouth daily.   BYSTOLIC 5 MG TABLET    take 1 tablet by mouth once daily   CHOLECALCIFEROL (VITAMIN D) 2000 UNITS CAPS    Take 1 capsule by mouth daily.   DONEPEZIL (ARICEPT) 5 MG TABLET    Take 5 mg by mouth 2 (two) times daily.   FLUTICASONE FUROATE-VILANTEROL (BREO ELLIPTA) 100-25 MCG/INH AEPB    Inhale 1 puff into the lungs daily.   FUROSEMIDE (LASIX) 40 MG TABLET    take 1 tablet by mouth once daily   IPRATROPIUM (ATROVENT) 0.06 % NASAL SPRAY    Place 2 sprays into both nostrils 2 (two) times daily.    MEMANTINE (NAMENDA) 10 MG TABLET    Take 1 tablet (10 mg total) by mouth 2 (two) times daily.   MULTIPLE VITAMINS-MINERALS (PRESERVISION AREDS 2) CAPS    Take 1 capsule by mouth daily.   ONDANSETRON (ZOFRAN ODT) 4 MG DISINTEGRATING TABLET    Take 1 tablet (4 mg total) by mouth every morning.   PANTOPRAZOLE (PROTONIX) 40 MG TABLET    Take 1 tablet (40 mg total) by mouth daily.   PHENYLEPHRINE (NEO-SYNEPHRINE) 1 % NASAL SPRAY    Place 2 drops into both nostrils 3 (three) times daily.   VAYACOG 100-19.5-6.5 MG CAPS    take 1 capsule by mouth once daily   VOLTAREN 1 % GEL    apply 2 grams to affected area four times a day if needed  Modified Medications   No medications on file  Discontinued Medications   ALBUTEROL (PROVENTIL) (2.5 MG/3ML) 0.083% NEBULIZER SOLUTION    Take 3 mLs (2.5 mg total) by nebulization every 6 (six) hours as needed for wheezing or shortness of breath.   PREDNISONE (DELTASONE) 20 MG TABLET    Take 1 tablet (20 mg total) by mouth daily with breakfast.     Physical Exam:  Vitals:   12/23/16 1530  BP: 132/80  Pulse: 64  Resp: 18  Temp: 97.8 F (36.6 C)  TempSrc: Oral  SpO2: 95%  Weight: 186 lb 9.6 oz (84.6 kg)  Height: 5\' 4"  (1.626 m)   Body mass index is 32.03 kg/m.  Physical Exam  Constitutional: She appears well-developed and well-nourished.  HENT:  Head: Normocephalic and atraumatic.  Nose: Nose normal.  Mouth/Throat:  Oropharynx is clear and moist. No oropharyngeal exudate.  Eyes: Conjunctivae and EOM are normal. Pupils are equal, round, and reactive to light.  Neck: Normal range of motion. Neck supple.  Cardiovascular: Normal rate, regular rhythm and normal heart sounds.   Pulmonary/Chest: Effort normal. She has no wheezes.  Abdominal: Soft. Bowel sounds are normal. She exhibits no distension.  Neurological: She is alert.    Labs reviewed: Basic Metabolic Panel:  Recent Labs  06/19/16 1543 07/23/16 1350 09/05/16 1537  NA 143 142 141  K 4.9 4.2 4.1  CL 102 102 104  CO2 32* 27 27  GLUCOSE 87 130* 72  BUN 31* 23 29*  CREATININE 1.24* 1.33* 1.20*  CALCIUM 8.7 9.0 8.7   Liver Function Tests: No results for input(s): AST, ALT, ALKPHOS, BILITOT, PROT, ALBUMIN in the last 8760 hours. No results for input(s): LIPASE, AMYLASE in the last 8760 hours. No results for input(s): AMMONIA in the last 8760 hours. CBC:  Recent Labs  01/17/16 1606 01/28/16 1510 01/31/16 1520  WBC CANCELED 8.1 9.1  NEUTROABS  --  5.3 7.2  HGB  --  13.7 13.9  HCT CANCELED 41.1 41.8  MCV  --  87.1 84.8  PLT CANCELED 249 239.0   Lipid Panel: No results for input(s): CHOL, HDL, LDLCALC, TRIG, CHOLHDL, LDLDIRECT in the last 8760 hours. TSH: No results for input(s): TSH in the last 8760 hours. A1C: Lab Results  Component Value Date   HGBA1C 5.6 04/12/2015     Assessment/Plan 1. Dementia with behavioral disturbance, unspecified dementia type Worsening dementia, resistant with self care/hygeinie.  Following with neurology as well. conts on aricept and namenda.   2. Anxiety Improved with prozac, pt still having some issues with bathing but overall improved.   3. Rhinorrhea -stable at this time. Will stop  - ipratropium (ATROVENT) 0.06 % nasal spray; Place 2 sprays into both nostrils 2 (two) times daily.  Dispense: 15 mL; Refill: 1  4. Essential hypertension Blood pressure stable, will cont current regimen.    Follow up bmp and cbc (rx given, pts brother would like these done at another lab)  Follow up in 3 months, sooner if needed  Taiquan Campanaro K. Harle Battiest  [facility] [phone] 8 am - 5 pm) [phone] (after hours)

## 2016-12-23 NOTE — Patient Instructions (Addendum)
Cont current medication, refill provided for atrovent

## 2016-12-25 ENCOUNTER — Telehealth: Payer: Self-pay

## 2016-12-25 NOTE — Telephone Encounter (Signed)
After reviewing patient's medications, Janett Billow instructed me to call patient's daughter, [name], to discuss whether or not she knew that patient had stopped taking fluoxentine 20 mg tablets.   [name] stated that she did know that patient had stopped taking the medication. She stated that the medication was stopped due to the fact that patient and patient's brother (caregiver) did not believe that patient was depressed and they did not want to subject patient to any potential side effects of a medication they felt was unnecessary. [name] did state that if Janett Billow felt that the medication was necessary, then she would discuss restarting the medication with her family but she was not sure that patient and caregiver would agree.

## 2016-12-26 ENCOUNTER — Other Ambulatory Visit: Payer: Self-pay | Admitting: Internal Medicine

## 2016-12-26 ENCOUNTER — Other Ambulatory Visit: Payer: Self-pay | Admitting: Adult Health

## 2016-12-26 NOTE — Telephone Encounter (Signed)
Spoke with patients sister she stated that she would get her son to give [place] a call back regarding medication request.

## 2016-12-29 ENCOUNTER — Ambulatory Visit: Payer: Medicare Other | Admitting: Allergy & Immunology

## 2017-02-01 ENCOUNTER — Emergency Department (HOSPITAL_COMMUNITY): Payer: Medicare Other

## 2017-02-01 ENCOUNTER — Encounter (HOSPITAL_COMMUNITY): Payer: Self-pay | Admitting: Emergency Medicine

## 2017-02-01 ENCOUNTER — Inpatient Hospital Stay (HOSPITAL_COMMUNITY)
Admission: EM | Admit: 2017-02-01 | Discharge: 2017-02-03 | DRG: 189 | Disposition: A | Discharge status: Home / Self Care | Payer: Medicare Other | Attending: Internal Medicine | Admitting: Internal Medicine

## 2017-02-01 DIAGNOSIS — I5032 Chronic diastolic (congestive) heart failure: Secondary | ICD-10-CM | POA: Diagnosis not present

## 2017-02-01 DIAGNOSIS — J449 Chronic obstructive pulmonary disease, unspecified: Secondary | ICD-10-CM | POA: Diagnosis present

## 2017-02-01 DIAGNOSIS — N179 Acute kidney failure, unspecified: Secondary | ICD-10-CM | POA: Diagnosis present

## 2017-02-01 DIAGNOSIS — G9340 Encephalopathy, unspecified: Secondary | ICD-10-CM | POA: Diagnosis not present

## 2017-02-01 DIAGNOSIS — I959 Hypotension, unspecified: Secondary | ICD-10-CM

## 2017-02-01 DIAGNOSIS — F0390 Unspecified dementia without behavioral disturbance: Secondary | ICD-10-CM | POA: Diagnosis present

## 2017-02-01 DIAGNOSIS — N183 Chronic kidney disease, stage 3 (moderate): Secondary | ICD-10-CM | POA: Diagnosis present

## 2017-02-01 DIAGNOSIS — R413 Other amnesia: Secondary | ICD-10-CM | POA: Diagnosis present

## 2017-02-01 DIAGNOSIS — Z87891 Personal history of nicotine dependence: Secondary | ICD-10-CM

## 2017-02-01 DIAGNOSIS — Z888 Allergy status to other drugs, medicaments and biological substances status: Secondary | ICD-10-CM

## 2017-02-01 DIAGNOSIS — J9601 Acute respiratory failure with hypoxia: Secondary | ICD-10-CM | POA: Diagnosis not present

## 2017-02-01 DIAGNOSIS — J9811 Atelectasis: Secondary | ICD-10-CM | POA: Diagnosis not present

## 2017-02-01 DIAGNOSIS — N1832 Chronic kidney disease, stage 3b: Secondary | ICD-10-CM | POA: Diagnosis present

## 2017-02-01 DIAGNOSIS — R531 Weakness: Secondary | ICD-10-CM | POA: Diagnosis not present

## 2017-02-01 DIAGNOSIS — Z85828 Personal history of other malignant neoplasm of skin: Secondary | ICD-10-CM

## 2017-02-01 DIAGNOSIS — R0902 Hypoxemia: Secondary | ICD-10-CM | POA: Diagnosis not present

## 2017-02-01 DIAGNOSIS — K219 Gastro-esophageal reflux disease without esophagitis: Secondary | ICD-10-CM | POA: Diagnosis present

## 2017-02-01 DIAGNOSIS — I130 Hypertensive heart and chronic kidney disease with heart failure and stage 1 through stage 4 chronic kidney disease, or unspecified chronic kidney disease: Secondary | ICD-10-CM | POA: Diagnosis not present

## 2017-02-01 DIAGNOSIS — E860 Dehydration: Secondary | ICD-10-CM | POA: Diagnosis present

## 2017-02-01 DIAGNOSIS — F22 Delusional disorders: Secondary | ICD-10-CM | POA: Diagnosis present

## 2017-02-01 DIAGNOSIS — I10 Essential (primary) hypertension: Secondary | ICD-10-CM | POA: Diagnosis present

## 2017-02-01 DIAGNOSIS — E785 Hyperlipidemia, unspecified: Secondary | ICD-10-CM | POA: Diagnosis present

## 2017-02-01 DIAGNOSIS — E861 Hypovolemia: Secondary | ICD-10-CM | POA: Diagnosis present

## 2017-02-01 DIAGNOSIS — Z7982 Long term (current) use of aspirin: Secondary | ICD-10-CM

## 2017-02-01 DIAGNOSIS — Z8249 Family history of ischemic heart disease and other diseases of the circulatory system: Secondary | ICD-10-CM

## 2017-02-01 DIAGNOSIS — Z79899 Other long term (current) drug therapy: Secondary | ICD-10-CM

## 2017-02-01 DIAGNOSIS — R404 Transient alteration of awareness: Secondary | ICD-10-CM | POA: Diagnosis not present

## 2017-02-01 HISTORY — DX: Hypotension, unspecified: I95.9

## 2017-02-01 LAB — COMPREHENSIVE METABOLIC PANEL
ALK PHOS: 60 U/L (ref 38–126)
ALT: 16 U/L (ref 14–54)
AST: 24 U/L (ref 15–41)
Albumin: 3.1 g/dL — ABNORMAL LOW (ref 3.5–5.0)
Anion gap: 6 (ref 5–15)
BUN: 21 mg/dL — AB (ref 6–20)
CALCIUM: 8.4 mg/dL — AB (ref 8.9–10.3)
CO2: 30 mmol/L (ref 22–32)
Chloride: 103 mmol/L (ref 101–111)
Creatinine, Ser: 1.6 mg/dL — ABNORMAL HIGH (ref 0.44–1.00)
GFR, EST AFRICAN AMERICAN: 34 mL/min — AB (ref >60)
GFR, EST NON AFRICAN AMERICAN: 30 mL/min — AB (ref >60)
Glucose, Bld: 109 mg/dL — ABNORMAL HIGH (ref 65–99)
Potassium: 3.9 mmol/L (ref 3.5–5.1)
Sodium: 139 mmol/L (ref 135–145)
Total Bilirubin: 1.1 mg/dL (ref 0.3–1.2)
Total Protein: 5.9 g/dL — ABNORMAL LOW (ref 6.5–8.1)

## 2017-02-01 LAB — CBC WITH DIFFERENTIAL/PLATELET
BASOS PCT: 0 %
Basophils Absolute: 0 10*3/uL (ref 0.0–0.1)
EOS ABS: 0.1 10*3/uL (ref 0.0–0.7)
EOS PCT: 1 %
HCT: 42.8 % (ref 36.0–46.0)
HEMOGLOBIN: 13.3 g/dL (ref 12.0–15.0)
LYMPHS ABS: 2.5 10*3/uL (ref 0.7–4.0)
Lymphocytes Relative: 28 %
MCH: 28.1 pg (ref 26.0–34.0)
MCHC: 31.1 g/dL (ref 30.0–36.0)
MCV: 90.5 fL (ref 78.0–100.0)
MONOS PCT: 8 %
Monocytes Absolute: 0.8 10*3/uL (ref 0.1–1.0)
NEUTROS PCT: 63 %
Neutro Abs: 5.8 10*3/uL (ref 1.7–7.7)
PLATELETS: 206 10*3/uL (ref 150–400)
RBC: 4.73 MIL/uL (ref 3.87–5.11)
RDW: 15 % (ref 11.5–15.5)
WBC: 9.2 10*3/uL (ref 4.0–10.5)

## 2017-02-01 LAB — I-STAT VENOUS BLOOD GAS, ED
ACID-BASE EXCESS: 4 mmol/L — AB (ref 0.0–2.0)
BICARBONATE: 32.1 mmol/L — AB (ref 20.0–28.0)
O2 Saturation: 84 %
PH VEN: 7.31 (ref 7.250–7.430)
TCO2: 34 mmol/L (ref 0–100)
pCO2, Ven: 63.9 mmHg — ABNORMAL HIGH (ref 44.0–60.0)
pO2, Ven: 56 mmHg — ABNORMAL HIGH (ref 32.0–45.0)

## 2017-02-01 LAB — STREP PNEUMONIAE URINARY ANTIGEN: Strep Pneumo Urinary Antigen: NEGATIVE

## 2017-02-01 LAB — URINALYSIS, ROUTINE W REFLEX MICROSCOPIC
BILIRUBIN URINE: NEGATIVE
Glucose, UA: NEGATIVE mg/dL
HGB URINE DIPSTICK: NEGATIVE
Ketones, ur: NEGATIVE mg/dL
Leukocytes, UA: NEGATIVE
NITRITE: NEGATIVE
PROTEIN: NEGATIVE mg/dL
SPECIFIC GRAVITY, URINE: 1.017 (ref 1.005–1.030)
pH: 5 (ref 5.0–8.0)

## 2017-02-01 LAB — INFLUENZA PANEL BY PCR (TYPE A & B)
INFLAPCR: NEGATIVE
INFLBPCR: NEGATIVE

## 2017-02-01 LAB — I-STAT CG4 LACTIC ACID, ED
LACTIC ACID, VENOUS: 1.8 mmol/L (ref 0.5–1.9)
Lactic Acid, Venous: 0.85 mmol/L (ref 0.5–1.9)

## 2017-02-01 LAB — I-STAT TROPONIN, ED: Troponin i, poc: 0 ng/mL (ref 0.00–0.08)

## 2017-02-01 LAB — BRAIN NATRIURETIC PEPTIDE: B NATRIURETIC PEPTIDE 5: 174.6 pg/mL — AB (ref 0.0–100.0)

## 2017-02-01 LAB — PHOSPHORUS: Phosphorus: 3 mg/dL (ref 2.5–4.6)

## 2017-02-01 LAB — MAGNESIUM: MAGNESIUM: 2.1 mg/dL (ref 1.7–2.4)

## 2017-02-01 LAB — PROCALCITONIN

## 2017-02-01 MED ORDER — DEXTROSE 5 % IV SOLN
1.0000 g | Freq: Once | INTRAVENOUS | Status: AC
  Administered 2017-02-01: 1 g via INTRAVENOUS
  Filled 2017-02-01: qty 10

## 2017-02-01 MED ORDER — SODIUM CHLORIDE 0.9 % IV SOLN
Freq: Once | INTRAVENOUS | Status: AC
  Administered 2017-02-01: 16:00:00 via INTRAVENOUS

## 2017-02-01 MED ORDER — LACTATED RINGERS IV BOLUS (SEPSIS)
500.0000 mL | Freq: Once | INTRAVENOUS | Status: AC
  Administered 2017-02-01: 500 mL via INTRAVENOUS

## 2017-02-01 MED ORDER — ASPIRIN 81 MG PO CHEW
81.0000 mg | CHEWABLE_TABLET | Freq: Every day | ORAL | Status: DC
  Administered 2017-02-02 – 2017-02-03 (×2): 81 mg via ORAL
  Filled 2017-02-01 (×3): qty 1

## 2017-02-01 MED ORDER — DEXTROSE 5 % IV SOLN
500.0000 mg | Freq: Once | INTRAVENOUS | Status: AC
  Administered 2017-02-01: 500 mg via INTRAVENOUS
  Filled 2017-02-01: qty 500

## 2017-02-01 MED ORDER — ONDANSETRON HCL 4 MG/2ML IJ SOLN: 4.0000 mg | Freq: Four times a day (QID) | INTRAMUSCULAR | Status: DC | PRN

## 2017-02-01 MED ORDER — DONEPEZIL HCL 5 MG PO TABS
10.0000 mg | ORAL_TABLET | Freq: Every day | ORAL | Status: DC
  Administered 2017-02-01 – 2017-02-02 (×2): 10 mg via ORAL
  Filled 2017-02-01 (×2): qty 2

## 2017-02-01 MED ORDER — SODIUM CHLORIDE 0.9 % IV BOLUS (SEPSIS)
500.0000 mL | Freq: Once | INTRAVENOUS | Status: AC
  Administered 2017-02-01: 500 mL via INTRAVENOUS

## 2017-02-01 MED ORDER — SODIUM CHLORIDE 0.9 % IV SOLN
INTRAVENOUS | Status: DC
  Administered 2017-02-02: 05:00:00 via INTRAVENOUS

## 2017-02-01 MED ORDER — SODIUM CHLORIDE 0.9% FLUSH
3.0000 mL | Freq: Two times a day (BID) | INTRAVENOUS | Status: DC
  Administered 2017-02-01: 3 mL via INTRAVENOUS

## 2017-02-01 MED ORDER — ACETAMINOPHEN 650 MG RE SUPP: 650.0000 mg | Freq: Four times a day (QID) | RECTAL | Status: DC | PRN

## 2017-02-01 MED ORDER — DEXTROSE 5 % IV SOLN
1.0000 g | INTRAVENOUS | Status: DC
  Administered 2017-02-02: 1 g via INTRAVENOUS
  Filled 2017-02-01: qty 10

## 2017-02-01 MED ORDER — IPRATROPIUM-ALBUTEROL 0.5-2.5 (3) MG/3ML IN SOLN: 3.0000 mL | RESPIRATORY_TRACT | Status: DC | PRN

## 2017-02-01 MED ORDER — ENOXAPARIN SODIUM 40 MG/0.4ML ~~LOC~~ SOLN
40.0000 mg | SUBCUTANEOUS | Status: DC
  Administered 2017-02-02: 40 mg via SUBCUTANEOUS
  Filled 2017-02-01 (×2): qty 0.4

## 2017-02-01 MED ORDER — SODIUM CHLORIDE 0.9 % IV BOLUS (SEPSIS)
1000.0000 mL | Freq: Once | INTRAVENOUS | Status: AC
  Administered 2017-02-01: 1000 mL via INTRAVENOUS

## 2017-02-01 MED ORDER — ACETAMINOPHEN 325 MG PO TABS: 650.0000 mg | ORAL_TABLET | Freq: Four times a day (QID) | ORAL | Status: DC | PRN

## 2017-02-01 MED ORDER — ATORVASTATIN CALCIUM 20 MG PO TABS
20.0000 mg | ORAL_TABLET | Freq: Every day | ORAL | Status: DC
  Administered 2017-02-02 – 2017-02-03 (×2): 20 mg via ORAL
  Filled 2017-02-01 (×2): qty 1

## 2017-02-01 MED ORDER — METHYLPREDNISOLONE SODIUM SUCC 125 MG IJ SOLR
125.0000 mg | Freq: Once | INTRAMUSCULAR | Status: AC
  Administered 2017-02-01: 125 mg via INTRAVENOUS
  Filled 2017-02-01: qty 2

## 2017-02-01 MED ORDER — MEMANTINE HCL 10 MG PO TABS
10.0000 mg | ORAL_TABLET | Freq: Two times a day (BID) | ORAL | Status: DC
  Administered 2017-02-01 – 2017-02-03 (×4): 10 mg via ORAL
  Filled 2017-02-01 (×4): qty 1

## 2017-02-01 MED ORDER — ALBUTEROL SULFATE (2.5 MG/3ML) 0.083% IN NEBU
5.0000 mg | INHALATION_SOLUTION | Freq: Once | RESPIRATORY_TRACT | Status: AC
  Administered 2017-02-01: 5 mg via RESPIRATORY_TRACT
  Filled 2017-02-01: qty 6

## 2017-02-01 MED ORDER — ONDANSETRON HCL 4 MG PO TABS: 4.0000 mg | ORAL_TABLET | Freq: Four times a day (QID) | ORAL | Status: DC | PRN

## 2017-02-01 MED ORDER — FLUTICASONE FUROATE-VILANTEROL 100-25 MCG/INH IN AEPB
1.0000 | INHALATION_SPRAY | Freq: Every day | RESPIRATORY_TRACT | Status: DC
  Administered 2017-02-02 – 2017-02-03 (×2): 1 via RESPIRATORY_TRACT
  Filled 2017-02-01: qty 28

## 2017-02-01 MED ORDER — SODIUM CHLORIDE 0.9 % IV SOLN
INTRAVENOUS | Status: DC
  Administered 2017-02-01: 19:00:00 via INTRAVENOUS

## 2017-02-01 MED ORDER — DEXTROSE 5 % IV SOLN
500.0000 mg | INTRAVENOUS | Status: DC
  Administered 2017-02-02: 500 mg via INTRAVENOUS
  Filled 2017-02-01: qty 500

## 2017-02-01 MED ORDER — SODIUM CHLORIDE 0.9 % IV BOLUS (SEPSIS): 500.0000 mL | Freq: Once | INTRAVENOUS | Status: DC

## 2017-02-01 MED ORDER — PANTOPRAZOLE SODIUM 40 MG PO TBEC
40.0000 mg | DELAYED_RELEASE_TABLET | Freq: Every day | ORAL | Status: DC
  Administered 2017-02-02 – 2017-02-03 (×2): 40 mg via ORAL
  Filled 2017-02-01 (×2): qty 1

## 2017-02-01 MED ORDER — IPRATROPIUM BROMIDE 0.06 % NA SOLN
2.0000 | Freq: Two times a day (BID) | NASAL | Status: DC
  Administered 2017-02-03: 2 via NASAL
  Filled 2017-02-01: qty 15

## 2017-02-01 MED ORDER — IPRATROPIUM BROMIDE 0.02 % IN SOLN
0.5000 mg | Freq: Once | RESPIRATORY_TRACT | Status: AC
  Administered 2017-02-01: 0.5 mg via RESPIRATORY_TRACT
  Filled 2017-02-01: qty 2.5

## 2017-02-01 MED ORDER — ACETAMINOPHEN 325 MG PO TABS
650.0000 mg | ORAL_TABLET | Freq: Once | ORAL | Status: AC
  Administered 2017-02-01: 650 mg via ORAL
  Filled 2017-02-01: qty 2

## 2017-02-01 NOTE — ED Notes (Signed)
Patient transported to X-ray 

## 2017-02-01 NOTE — ED Triage Notes (Signed)
Pt in from home via [facility] with increased confusion/AMS with hypotension. Per EMS, pt has dementia but family reported increased confusion x 30 min at 1215 today. Pt's initial BP 104/60, 80/47 at lowest. Pt was given 300 cc's NS. 88% on RA, placed on 2L [place]. A&ox2. Pt takes Lasix of unknown dose

## 2017-02-01 NOTE — H&P (Signed)
History and Physical    Virginia [name] [id number] DOB: 05/18/1936 DOA: 02/01/2017   PCP: Lauree Chandler, NP   Patient coming from/Resides with: Private residence/lives with brother and son  Admission status: Observation/telemetry -it may be medically necessary to stay a minimum 2 midnights to rule out impending and/or unexpected changes in physiologic status that may differ from initial evaluation performed in the ER and/or at time of admission therefore consider reevaluation of admission status in 24 hours.   Chief Complaint: Altered mental status and low blood pressure  HPI: Virginia [name] is a 81 y.o. female with medical history significant for dementia on Aricept, hypertension, chronic diastolic heart failure on Lasix, COPD GOLD IV not on oxygen at home, dyslipidemia. Patient was noted with increasing confusion this morning. According to her brother the patient called out to him stating she could not get out of bed. Typically she does not utilize a walker or other assist devices but due to gait instability and generalized weakness she had to utilize a rolling walker to get out of bed and out of her room. She remained confused and appeared pale so they checked her blood pressure at home and apparently this was in the [id number] range systolic. Subsequently they called EMS. Her initial blood pressure was 12/04/1956 upon arrival but did decrease to as low as 80/47. She was given 300 mL normal saline bolus in the field. Her room air saturations were 88% so she was placed on 2 L of oxygen. She had not had a Lasix dosage today. Her brother reported abrupt onset of poor oral intake beginning yesterday. She has not had any fevers chills or cough.  ED Course:  Vital Signs: BP 121/57   Pulse 102   Temp 99.6 F (37.6 C) (Rectal)   Resp 22   Ht 5\' 3"  (1.6 m)   Wt 84.4 kg (186 lb)   SpO2 100%   BMI 32.95 kg/m  Two-view CXR: Mild hypoinflation with bibasilar atelectasis Lab data: Sodium 139,  potassium 3.9, chloride 103, CO2 30, glucose 109, BUN 21, creatinine 1.6, albumin 3.1, LFTs not elevated, WBC 9200 with normal differential, hemoglobin 13.3, platelets 206,000, lactic acid 0.85, influenza A and B was negative, urinalysis unremarkable, blood cultures have been obtained in the ER. Medications and treatments: Normal saline bolus for total of 2.5 L, Tylenol 650 mg 1, Solu-Medrol 125 mg IV 1, Proventil neb 5 mg 1, Atrovent nebs 0.5 mg 1, Rocephin 1 g IV 1, Zithromax 500 mg IV 1  Review of Systems:  In addition to the HPI above,  No Fever-chills, myalgias or other constitutional symptoms No Headache, changes with Vision or hearing, new weakness, tingling, numbness in any extremity, dizziness, dysarthria or word finding difficulty, tremors or seizure activity No problems swallowing food or Liquids, indigestion/reflux, choking or coughing while eating, abdominal pain with or after eating No Chest pain, Cough or Shortness of Breath, palpitations, orthopnea or DOE No Abdominal pain, N/V, melena,hematochezia, dark tarry stools, constipation No dysuria, malodorous urine, hematuria or flank pain No new skin rashes, lesions, masses or bruises, No new joint pains, aches, swelling or redness No recent unintentional weight gain or loss No polyuria, polydypsia or polyphagia   Past Medical History:  Diagnosis Date  . Carotid artery occlusion   . CKD (chronic kidney disease)   . COPD (chronic obstructive pulmonary disease) ([place])   . Diverticulosis   . Fall at home Sept. 2013  . Hyperlipidemia   . Hypertension   .  Internal hemorrhoid   . Memory disorder 10/24/2014    Past Surgical History:  Procedure Laterality Date  . APPENDECTOMY    . BREAST REDUCTION SURGERY    . CAROTID ENDARTERECTOMY     left CEA  . CATARACT EXTRACTION Bilateral   . COLONOSCOPY  2015  . EYE SURGERY     cataracts removed, bilaterally   . skin cancer removal    . TONSILLECTOMY      Social History    Social History  . Marital status: Divorced    Spouse name: N/A  . Number of children: 2  . Years of education: 7   Occupational History  . retired    Social History Main Topics  . Smoking status: Former Smoker    Years: 14.00    Types: Cigarettes    Quit date: 11/30/1994  . Smokeless tobacco: Never Used     Comment: very light smoker, not even every day   . Alcohol use No  . Drug use: No  . Sexual activity: No   Other Topics Concern  . Not on file   Social History Narrative   Diet:       Do you drink/eat things with caffeine: yes      Marital Status: Divorced   What Year Married:1959      Do you live in a house, apartment, Assisted Living, Condo, trailer? House      Is it one or more stories? 2 stories      How many persons live in your home? Just Patient      Do you have any pets in your home? 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 Very Little   Type and how often? Walk      Do you have a living will? None   DNR?   Discuss one? No      Do you have signed POA/HPOA forms?      Patient drinks 1 cup of caffeine daily.   Patient is right handed.    Mobility: Independent without assistive devices Work history: N/A   Allergies  Allergen Reactions  . Lidocaine Anaphylaxis, Swelling, Rash and Other (See Comments)    Any of the " [facility] "  . Other Other (See Comments)    All drugs that end in "cane'-  novacane etc. Daughter states patient almost died when she had it when she was born    Family History  Problem Relation Age of Onset  . Heart disease Father     Before age 66  . Hypertension Father   . Heart attack Father   . Cancer Mother 67    Brain  . Dementia Brother   . Cancer Maternal Aunt 61    breast cancer  . Diabetes Maternal Aunt   . Heart failure Maternal Grandmother   . Diabetes Maternal Grandmother   . Stroke Neg Hx      Prior to Admission medications   Medication Sig Start Date End Date  Taking? Authorizing Provider  ARTIFICIAL TEAR OP Place 2 drops into both eyes daily.     Historical Provider, MD  aspirin 81 MG tablet Take 81 mg by mouth daily.    Historical Provider, MD  atorvastatin (LIPITOR) 20 MG tablet take 1 tablet by mouth once daily 05/27/16   Fay Records, MD  b complex vitamins capsule Take 1 capsule by mouth daily.    Historical  Provider, MD  BYSTOLIC 5 MG tablet take 1 tablet by mouth once daily 02/19/16   Fay Records, MD  Cholecalciferol (VITAMIN D) 2000 UNITS CAPS Take 1 capsule by mouth daily.    Historical Provider, MD  donepezil (ARICEPT) 5 MG tablet Take 5 mg by mouth 2 (two) times daily.    Historical Provider, MD  fluticasone furoate-vilanterol (BREO ELLIPTA) 100-25 MCG/INH AEPB Inhale 1 puff into the lungs daily. 02/19/16   Melvenia Needles, NP  furosemide (LASIX) 40 MG tablet take 1 tablet by mouth once daily 11/21/16   Fay Records, MD  ipratropium (ATROVENT) 0.06 % nasal spray Place 2 sprays into both nostrils 2 (two) times daily. 12/23/16   Lauree Chandler, NP  memantine (NAMENDA) 10 MG tablet Take 1 tablet (10 mg total) by mouth 2 (two) times daily. 11/12/16   Kathrynn Ducking, MD  Multiple Vitamins-Minerals (PRESERVISION AREDS 2) CAPS Take 1 capsule by mouth daily.    Historical Provider, MD  ondansetron (ZOFRAN ODT) 4 MG disintegrating tablet Take 1 tablet (4 mg total) by mouth every morning. 11/04/16   Jerene Bears, MD  pantoprazole (PROTONIX) 40 MG tablet Take 1 tablet (40 mg total) by mouth daily. 11/04/16   Jerene Bears, MD  VAYACOG 100-19.5-6.5 MG CAPS take 1 capsule by mouth once daily 07/15/16   Kathrynn Ducking, MD  VOLTAREN 1 % GEL apply 2 grams to affected area four times a day if needed 08/18/16   Historical Provider, MD    Physical Exam: Vitals:   02/01/17 1545 02/01/17 1645 02/01/17 1700 02/01/17 1702  BP:   121/57   Pulse: 98 96 102   Resp: 19 22    Temp:    99.6 F (37.6 C)  TempSrc:    Rectal  SpO2: 94% 100% 100%   Weight:        Height:          Constitutional: NAD, Anxious, comfortable-sitting on bedside commode without any reports of dizziness or weakness Eyes: PERRL, lids and conjunctivae normal ENMT: Mucous membranes are dry. Posterior pharynx clear of any exudate or lesions. Neck: normal, supple, no masses, no thyromegaly Respiratory: clear to auscultation bilaterally, no wheezing, a few left basilar crackles. Normal respiratory effort. No accessory muscle use. 2 L Cardiovascular: Regular rate and rhythm, no murmurs / rubs / gallops. No extremity edema. 2+ pedal pulses. No carotid bruits.  Abdomen: no tenderness, no masses palpated. No hepatosplenomegaly. Bowel sounds positive.  Musculoskeletal: no clubbing / cyanosis. No joint deformity upper and lower extremities. Good ROM, no contractures. Normal muscle tone. Right reported knee pain not reproducible with palpation-patient uncooperative with attempts at passive and active range of motion Skin: no rashes, lesions, ulcers. No induration Neurologic: CN 2-12 grossly intact. Sensation intact, DTR normal. Strength 5/5 x all 4 extremities.  Psychiatric: Confused and disoriented. She repeatedly tells me that "I was here visiting somebody the next thing I knew I was in this room and they're holding me against my will"    Labs on Admission: I have personally reviewed following labs and imaging studies  CBC:  Recent Labs Lab 02/01/17 1450  WBC 9.2  NEUTROABS 5.8  HGB 13.3  HCT 42.8  MCV 90.5  PLT 99991111   Basic Metabolic Panel:  Recent Labs Lab 02/01/17 1450  NA 139  K 3.9  CL 103  CO2 30  GLUCOSE 109*  BUN 21*  CREATININE 1.60*  CALCIUM 8.4*   GFR: Estimated Creatinine  Clearance: 29.8 mL/min (by C-G formula based on SCr of 1.6 mg/dL (H)). Liver Function Tests:  Recent Labs Lab 02/01/17 1450  AST 24  ALT 16  ALKPHOS 60  BILITOT 1.1  PROT 5.9*  ALBUMIN 3.1*   No results for input(s): LIPASE, AMYLASE in the last 168 hours. No results  for input(s): AMMONIA in the last 168 hours. Coagulation Profile: No results for input(s): INR, PROTIME in the last 168 hours. Cardiac Enzymes: No results for input(s): CKTOTAL, CKMB, CKMBINDEX, TROPONINI in the last 168 hours. BNP (last 3 results) No results for input(s): PROBNP in the last 8760 hours. HbA1C: No results for input(s): HGBA1C in the last 72 hours. CBG: No results for input(s): GLUCAP in the last 168 hours. Lipid Profile: No results for input(s): CHOL, HDL, LDLCALC, TRIG, CHOLHDL, LDLDIRECT in the last 72 hours. Thyroid Function Tests: No results for input(s): TSH, T4TOTAL, FREET4, T3FREE, THYROIDAB in the last 72 hours. Anemia Panel: No results for input(s): VITAMINB12, FOLATE, FERRITIN, TIBC, IRON, RETICCTPCT in the last 72 hours. Urine analysis:    Component Value Date/Time   COLORURINE YELLOW 02/01/2017 1659   APPEARANCEUR HAZY (A) 02/01/2017 1659   LABSPEC 1.017 02/01/2017 1659   PHURINE 5.0 02/01/2017 1659   GLUCOSEU NEGATIVE 02/01/2017 1659   HGBUR NEGATIVE 02/01/2017 1659   BILIRUBINUR NEGATIVE 02/01/2017 1659   KETONESUR NEGATIVE 02/01/2017 1659   PROTEINUR NEGATIVE 02/01/2017 1659   UROBILINOGEN 0.2 11/09/2009 0700   NITRITE NEGATIVE 02/01/2017 1659   LEUKOCYTESUR NEGATIVE 02/01/2017 1659   Sepsis Labs: @LABRCNTIP (procalcitonin:4,lacticidven:4) )No results found for this or any previous visit (from the past 240 hour(s)).   Radiological Exams on Admission: Dg Chest 2 View  Result Date: 02/01/2017 CLINICAL DATA:  Altered mental status with hypotension.  Hypoxia. EXAM: CHEST  2 VIEW COMPARISON:  12/02/2016 FINDINGS: The cardiomediastinal silhouette is within normal limits. Aortic atherosclerosis is noted. The lungs are hypoinflated with mild bibasilar opacities suggesting atelectasis. There is no evidence of edema, pleural effusion, or pneumothorax. There is a chronic deformity of the proximal right humerus. IMPRESSION: Mild hypoinflation with bibasilar  atelectasis. Electronically Signed   By: Logan Bores M.D.   On: 02/01/2017 14:41    EKG: (Independently reviewed) Sinus rhythm with ventricular rate 85 bpm, QTC 470 ms, no definitive acute ischemic changes, elevated J point in inferior lateral leads  Assessment/Plan Principal Problem:   Acute respiratory failure with hypoxia  -Patient presents from home with altered mental status, hypotension, audible wheezing and hypoxia with initial chest x-ray unremarkable - after hydration has developed some fine crackles in the left base -Differential includes COPD exacerbation vs early evolving pneumonia vs hypoventilation from altered mental status -Continue supportive care including oxygen -Treat underlying issues -Influenza A/B- so we'll obtain a full respiratory virus panel -Given her COPD, patient may require ambulatory pulse oximetry to determine if she is eligible for home O2  Active Problems:   COPD GOLD IV -Was wheezing upon presentation and this improved with the administration of nebs and Solu-Medrol -Continue DuoNeb prn -Not wheezing now and given patient's dementia like to minimize risk of steroid-induced acute delirium/psychosis so would only begin Solu-Medrol if she redevelops wheezing and then would use lowest dose possible -Continue home Atrovent nasal spray and Breo Ellipta MDI -Per outpatient pulmonary notes COPD symptoms influenced by reflux so continue PPI -Has developed crackles left base after hydration so maybe early pneumonia so continue empiric Rocephin and Zithromax -Urinary strep    Acute kidney injury on stage III chronic  kidney disease -Very mild in nature with baseline renal function 29 and 1.20 with current renal function 21 and 1.6 -Hold Lasix -Likely precipitated by hypotension in setting of presumed dehydration -Follow labs -125 mL urine obtained with I/O catheterization so do not suspect bladder outlet obstruction as etiology to abn Scr    Hypotension -Has  resolved with 2500 cc volume in the ER -Continue normal saline at 100/hr 12 hours then decrease to 50/hr -Holld preadmission Lasix and Bystolic -Possible sepsis etiology with pulmonary likely source -Initial lactate negative continue to cycle -Empiric antibiotics as above -Procalcitonin -Urinalysis unremarkable -Follow up on blood and urine cultures -Was treated with antibiotics in anyway for bronchitis symptoms -Brother reports consistently labile blood pressure readings at home with some days blood pressure readings being high and some days blood pressure readings being somewhat low but not in the hypotensive range -Check orthostatic vital signs since she does take Aricept at home    Essential hypertension -Current blood pressure readings suboptimal so holding Lasix and Bystolic    Chronic diastolic heart failure -Currently appears compensated based on clinical exam and presentation consistent with hypovolemia -BNP pending (obtained in setting of presentation of wheezing and hypoxemia with negative chest x-ray) -Holding diuretic and beta blocker as above -Not on ARB secondary to chronic kidney disease -Daily weights/strict I and O    Memory disorder with acute encephalopathy -Patient presents with acute encephalopathy evidenced by confusion and worsened paranoia in setting of recent hypotension and suspected occult infection -Continue Aricept and Namenda -Monitor for evolution into acute delirium or psychosis -Patient reported falling recently and complaining of knee pain and if knee pain continues may require x-ray analysis -PT/OT evaluation -CM to determine if patient eligible for personal care aide at home since she has female caregivers    Hyperlipidemia  -Continue Lipitor    DVT prophylaxis: Lovenox Code Status: Full Family Communication: Brother at bedside; son via phone Disposition Plan: Home Consults called: None    Jhett Fretwell L. ANP-BC [facility] Pager [phone]   If 7PM-7AM, please contact night-coverage [email or web address] Password [facility]  02/01/2017, 5:24 PM

## 2017-02-01 NOTE — ED Provider Notes (Signed)
Care of pt assumed from Dr. Porfirio Mylar at 3:30p  Pt arrives from home due to AMS,  hypotension BP 46 at home, confusion that has now improved to her baseline Hypoxic without oxygen at 88% 1L total given (EMS + ED)  Labs wo white count, no fever On my reassessment, not wheezing but sating 84% on RA Placed on 3L Empirically treated with rocephin  Will admit, family updated    Karma Greaser, MD 02/01/17 2317    Blanchie Dessert, MD 02/02/17 0002

## 2017-02-01 NOTE — ED Provider Notes (Signed)
[facility] DEPT Provider Note   CSN: [id number] Arrival date & time: 02/01/17  1301     History   Chief Complaint Chief Complaint  Patient presents with  . Altered Mental Status  . Hypotension    HPI Virginia [name] is a 81 y.o. female.  Level V Caveat: Dementia   Virginia [name] is a 81 y.o. Female with a history of dementia, COPD, CKD and HTN who presents to the ED via EMS with fatigue, confusion and low blood pressures. Brother at bedside helps provide history. He tells me this morning he checked her blood pressure and found it to be 46 systolic on allergen blood pressure cuff. This is concerning to him. He then noticed the patient seemed to be more confused than normal. He reports this resolved quickly and she seems to be at her baseline currently. He reports over the past is a patient is been more fatigued and sleeping more. She required some assistance getting out of the bed. EMS reports no focal weakness. Brother reports patient is currently at her baseline mental status. The patient denies any complaints. She denies fevers, coughing, SOB, abdominal pain, nausea or vomiting.    The history is provided by the patient, medical records, the EMS personnel and a relative. No language interpreter was used.  Altered Mental Status      Past Medical History:  Diagnosis Date  . Carotid artery occlusion   . CKD (chronic kidney disease)   . COPD (chronic obstructive pulmonary disease) ([place])   . Diverticulosis   . Fall at home Sept. 2013  . Hyperlipidemia   . Hypertension   . Internal hemorrhoid   . Memory disorder 10/24/2014    Patient Active Problem List   Diagnosis Date Noted  . Cough 01/31/2016  . Osteoarthritis 01/17/2016  . Vitamin D deficiency 02/15/2015  . Hyperlipidemia 01/18/2015  . COPD GOLD IV 12/30/2014  . Diastolic CHF, acute ([place]) 12/08/2014  . Dyspnea   . Congestive heart disease ([place])   . Hyponatremia 12/02/2014  . Essential hypertension 12/02/2014  .  Nausea and vomiting 11/30/2014  . Memory disorder 10/24/2014  . Carotid stenosis 10/19/2012    Past Surgical History:  Procedure Laterality Date  . APPENDECTOMY    . BREAST REDUCTION SURGERY    . CAROTID ENDARTERECTOMY     left CEA  . CATARACT EXTRACTION Bilateral   . COLONOSCOPY  2015  . EYE SURGERY     cataracts removed, bilaterally   . skin cancer removal    . TONSILLECTOMY      OB History    No data available       Home Medications    Prior to Admission medications   Medication Sig Start Date End Date Taking? Authorizing Provider  ARTIFICIAL TEAR OP Place 2 drops into both eyes daily.     Historical Provider, MD  aspirin 81 MG tablet Take 81 mg by mouth daily.    Historical Provider, MD  atorvastatin (LIPITOR) 20 MG tablet take 1 tablet by mouth once daily 05/27/16   Fay Records, MD  b complex vitamins capsule Take 1 capsule by mouth daily.    Historical Provider, MD  BYSTOLIC 5 MG tablet take 1 tablet by mouth once daily 02/19/16   Fay Records, MD  Cholecalciferol (VITAMIN D) 2000 UNITS CAPS Take 1 capsule by mouth daily.    Historical Provider, MD  donepezil (ARICEPT) 5 MG tablet Take 5 mg by mouth 2 (two) times daily.  Historical Provider, MD  fluticasone furoate-vilanterol (BREO ELLIPTA) 100-25 MCG/INH AEPB Inhale 1 puff into the lungs daily. 02/19/16   Melvenia Needles, NP  furosemide (LASIX) 40 MG tablet take 1 tablet by mouth once daily 11/21/16   Fay Records, MD  ipratropium (ATROVENT) 0.06 % nasal spray Place 2 sprays into both nostrils 2 (two) times daily. 12/23/16   Lauree Chandler, NP  memantine (NAMENDA) 10 MG tablet Take 1 tablet (10 mg total) by mouth 2 (two) times daily. 11/12/16   Kathrynn Ducking, MD  Multiple Vitamins-Minerals (PRESERVISION AREDS 2) CAPS Take 1 capsule by mouth daily.    Historical Provider, MD  ondansetron (ZOFRAN ODT) 4 MG disintegrating tablet Take 1 tablet (4 mg total) by mouth every morning. 11/04/16   Jerene Bears, MD    pantoprazole (PROTONIX) 40 MG tablet Take 1 tablet (40 mg total) by mouth daily. 11/04/16   Jerene Bears, MD  VAYACOG 100-19.5-6.5 MG CAPS take 1 capsule by mouth once daily 07/15/16   Kathrynn Ducking, MD  VOLTAREN 1 % GEL apply 2 grams to affected area four times a day if needed 08/18/16   Historical Provider, MD    Family History Family History  Problem Relation Age of Onset  . Heart disease Father     Before age 76  . Hypertension Father   . Heart attack Father   . Cancer Mother 44    Brain  . Dementia Brother   . Cancer Maternal Aunt 61    breast cancer  . Diabetes Maternal Aunt   . Heart failure Maternal Grandmother   . Diabetes Maternal Grandmother   . Stroke Neg Hx     Social History Social History  Substance Use Topics  . Smoking status: Former Smoker    Years: 14.00    Types: Cigarettes    Quit date: 11/30/1994  . Smokeless tobacco: Never Used     Comment: very light smoker, not even every day   . Alcohol use No     Allergies   Lidocaine and Other   Review of Systems Review of Systems  Unable to perform ROS: Dementia  Constitutional: Positive for fatigue.  Gastrointestinal: Negative for diarrhea and vomiting.  Skin: Negative for rash.     Physical Exam Updated Vital Signs BP (!) 86/75   Pulse 95   Temp 99.2 F (37.3 C) (Oral)   Resp 19   Ht 5\' 3"  (1.6 m)   Wt 84.4 kg   SpO2 97%   BMI 32.95 kg/m   Physical Exam  Constitutional: She appears well-developed and well-nourished. No distress.  Nontoxic appearing.  HENT:  Head: Normocephalic and atraumatic.  Right Ear: External ear normal.  Left Ear: External ear normal.  Mouth/Throat: Oropharynx is clear and moist.  Eyes: Conjunctivae and EOM are normal. Pupils are equal, round, and reactive to light. Right eye exhibits no discharge. Left eye exhibits no discharge.  Neck: Normal range of motion. Neck supple. No JVD present. No tracheal deviation present.  Cardiovascular: Normal rate, regular  rhythm, normal heart sounds and intact distal pulses.  Exam reveals no gallop and no friction rub.   No murmur heard. Heart rate is 96. Bilateral radial, posterior tibialis and dorsalis pedis pulses are intact.    Pulmonary/Chest: Effort normal. No stridor. No respiratory distress. She has wheezes. She has no rales.  Lung sounds diminished her bilateral bases with scattered wheezing. No increased work of breathing. Oxygen saturation is 88% on room  air. This improved to 98% on 2 L via nasal cannula.  Abdominal: Soft. There is no tenderness. There is no guarding.  Abdomen is soft and nontender to palpation.  Musculoskeletal: She exhibits no tenderness.  Very mild bilateral lower extremity edema. No calf tenderness bilaterally. Patient is spontaneously moving all extremities in a coordinated fashion exhibiting good strength.   Lymphadenopathy:    She has no cervical adenopathy.  Neurological: She is alert. No cranial nerve deficit or sensory deficit. She exhibits normal muscle tone. Coordination normal.  Patient is alert and oriented 2. She is oriented to person and place. She has confusion with time and event. Cranial nerves are intact. Speech is clear and coherent. EOMs are intact. No pronator drift. Sensation is intact in her bilateral upper and lower extremities. She is moving all of her extremities with good strength. Good strength with plantar and dorsiflexion bilaterally.  Skin: Skin is warm and dry. Capillary refill takes less than 2 seconds. No rash noted. She is not diaphoretic. No erythema. No pallor.  Psychiatric: She has a normal mood and affect. Her behavior is normal.  Nursing note and vitals reviewed.    ED Treatments / Results  Labs (all labs ordered are listed, but only abnormal results are displayed) Labs Reviewed  CULTURE, BLOOD (ROUTINE X 2)  CULTURE, BLOOD (ROUTINE X 2)  COMPREHENSIVE METABOLIC PANEL  CBC WITH DIFFERENTIAL/PLATELET  URINALYSIS, ROUTINE W REFLEX  MICROSCOPIC  INFLUENZA PANEL BY PCR (TYPE A & B)  I-STAT CG4 LACTIC ACID, ED  I-STAT VENOUS BLOOD GAS, ED    EKG  EKG Interpretation  Date/Time:  Sunday February 01 2017 13:11:17 EST Ventricular Rate:  85 PR Interval:    QRS Duration: 79 QT Interval:  350 QTC Calculation: 417 R Axis:   80 Text Interpretation:  Sinus rhythm Low voltage, precordial leads Basleine wander  and artifact No acute changes Confirmed by Ellender Hose MD, Lysbeth Galas [phone]) on 02/01/2017 1:13:09 PM       Radiology Dg Chest 2 View  Result Date: 02/01/2017 CLINICAL DATA:  Altered mental status with hypotension.  Hypoxia. EXAM: CHEST  2 VIEW COMPARISON:  12/02/2016 FINDINGS: The cardiomediastinal silhouette is within normal limits. Aortic atherosclerosis is noted. The lungs are hypoinflated with mild bibasilar opacities suggesting atelectasis. There is no evidence of edema, pleural effusion, or pneumothorax. There is a chronic deformity of the proximal right humerus. IMPRESSION: Mild hypoinflation with bibasilar atelectasis. Electronically Signed   By: Logan Bores M.D.   On: 02/01/2017 14:41    Procedures Procedures (including critical care time)  Medications Ordered in ED Medications  0.9 %  sodium chloride infusion (not administered)  methylPREDNISolone sodium succinate (SOLU-MEDROL) 125 mg/2 mL injection 125 mg (not administered)  albuterol (PROVENTIL) (2.5 MG/3ML) 0.083% nebulizer solution 5 mg (not administered)  ipratropium (ATROVENT) nebulizer solution 0.5 mg (not administered)  sodium chloride 0.9 % bolus 500 mL (0 mLs Intravenous Stopped 02/01/17 1411)  acetaminophen (TYLENOL) tablet 650 mg (650 mg Oral Given 02/01/17 1512)     Initial Impression / Assessment and Plan / ED Course  I have reviewed the triage vital signs and the nursing notes.  Pertinent labs & imaging results that were available during my care of the patient were reviewed by me and considered in my medical decision making (see chart for  details).    This is a 81 y.o. Female with a history of dementia, COPD, CKD and HTN who presents to the ED via EMS with fatigue, confusion and low blood  pressures. Brother at bedside helps provide history. He tells me this morning he checked her blood pressure and found it to be 46 systolic on allergen blood pressure cuff. This is concerning to him. He then noticed the patient seemed to be more confused than normal. He reports this resolved quickly and she seems to be at her baseline currently. He reports over the past is a patient is been more fatigued and sleeping more. She required some assistance getting out of the bed.  On exam the patient is afebrile and non-toxic appearing. She is alert and oriented 2. She is confusion about place and event. She denies any complaints and tells me she is ready to go home. She is diminished lung sounds are scattered wheezing bilaterally. She has hypoxia at 88% on room air that improved to 97% on 2 L via nasal cannula. No increased work of breathing. Abdomen is soft and nontender. No significant lower extremity edema.  Blood work, UA and CXR ordered.  X-ray shows mild hyperinflation of bibasilar atelectasis. Lactic acid is 0.85 after half a liter by EMS and half liter in the emergency department. Her vital breathing treatment and Solu-Medrol. At shift change patient is awaiting blood work and UA. Plan for admission. Patient care signed out to Rosario Adie, MD at shift change who will disposition the patient.   This patient was discussed with and evaluated by Dr. Myrene Buddy who agrees with assessment and plan.   Final Clinical Impressions(s) / ED Diagnoses   Final diagnoses:  Hypoxia    New Prescriptions New Prescriptions   No medications on file     Waynetta Pean, PA-C 02/01/17 [place], MD 02/01/17 [phone]

## 2017-02-01 NOTE — ED Notes (Signed)
Admitting Provider at bedside. 

## 2017-02-02 ENCOUNTER — Observation Stay (HOSPITAL_COMMUNITY): Payer: Medicare Other

## 2017-02-02 DIAGNOSIS — I10 Essential (primary) hypertension: Secondary | ICD-10-CM | POA: Diagnosis not present

## 2017-02-02 DIAGNOSIS — J449 Chronic obstructive pulmonary disease, unspecified: Secondary | ICD-10-CM | POA: Diagnosis not present

## 2017-02-02 DIAGNOSIS — Z8249 Family history of ischemic heart disease and other diseases of the circulatory system: Secondary | ICD-10-CM | POA: Diagnosis not present

## 2017-02-02 DIAGNOSIS — G9340 Encephalopathy, unspecified: Secondary | ICD-10-CM | POA: Diagnosis present

## 2017-02-02 DIAGNOSIS — Z85828 Personal history of other malignant neoplasm of skin: Secondary | ICD-10-CM | POA: Diagnosis not present

## 2017-02-02 DIAGNOSIS — I959 Hypotension, unspecified: Secondary | ICD-10-CM | POA: Diagnosis present

## 2017-02-02 DIAGNOSIS — E861 Hypovolemia: Secondary | ICD-10-CM | POA: Diagnosis present

## 2017-02-02 DIAGNOSIS — N183 Chronic kidney disease, stage 3 (moderate): Secondary | ICD-10-CM | POA: Diagnosis not present

## 2017-02-02 DIAGNOSIS — Z79899 Other long term (current) drug therapy: Secondary | ICD-10-CM | POA: Diagnosis not present

## 2017-02-02 DIAGNOSIS — R0902 Hypoxemia: Secondary | ICD-10-CM | POA: Diagnosis not present

## 2017-02-02 DIAGNOSIS — Z7982 Long term (current) use of aspirin: Secondary | ICD-10-CM | POA: Diagnosis not present

## 2017-02-02 DIAGNOSIS — E785 Hyperlipidemia, unspecified: Secondary | ICD-10-CM | POA: Diagnosis present

## 2017-02-02 DIAGNOSIS — N179 Acute kidney failure, unspecified: Secondary | ICD-10-CM

## 2017-02-02 DIAGNOSIS — F0390 Unspecified dementia without behavioral disturbance: Secondary | ICD-10-CM | POA: Diagnosis present

## 2017-02-02 DIAGNOSIS — I5032 Chronic diastolic (congestive) heart failure: Secondary | ICD-10-CM | POA: Diagnosis present

## 2017-02-02 DIAGNOSIS — F22 Delusional disorders: Secondary | ICD-10-CM | POA: Diagnosis present

## 2017-02-02 DIAGNOSIS — J9690 Respiratory failure, unspecified, unspecified whether with hypoxia or hypercapnia: Secondary | ICD-10-CM | POA: Diagnosis not present

## 2017-02-02 DIAGNOSIS — J9601 Acute respiratory failure with hypoxia: Secondary | ICD-10-CM | POA: Diagnosis not present

## 2017-02-02 DIAGNOSIS — Z87891 Personal history of nicotine dependence: Secondary | ICD-10-CM | POA: Diagnosis not present

## 2017-02-02 DIAGNOSIS — Z888 Allergy status to other drugs, medicaments and biological substances status: Secondary | ICD-10-CM | POA: Diagnosis not present

## 2017-02-02 DIAGNOSIS — I130 Hypertensive heart and chronic kidney disease with heart failure and stage 1 through stage 4 chronic kidney disease, or unspecified chronic kidney disease: Secondary | ICD-10-CM | POA: Diagnosis present

## 2017-02-02 DIAGNOSIS — E860 Dehydration: Secondary | ICD-10-CM | POA: Diagnosis present

## 2017-02-02 DIAGNOSIS — K219 Gastro-esophageal reflux disease without esophagitis: Secondary | ICD-10-CM | POA: Diagnosis present

## 2017-02-02 LAB — RESPIRATORY PANEL BY PCR
ADENOVIRUS-RVPPCR: NOT DETECTED
Bordetella pertussis: NOT DETECTED
CORONAVIRUS HKU1-RVPPCR: NOT DETECTED
CORONAVIRUS NL63-RVPPCR: NOT DETECTED
Chlamydophila pneumoniae: NOT DETECTED
Coronavirus 229E: NOT DETECTED
Coronavirus OC43: NOT DETECTED
INFLUENZA A-RVPPCR: NOT DETECTED
Influenza B: NOT DETECTED
MYCOPLASMA PNEUMONIAE-RVPPCR: NOT DETECTED
Metapneumovirus: NOT DETECTED
PARAINFLUENZA VIRUS 1-RVPPCR: NOT DETECTED
PARAINFLUENZA VIRUS 3-RVPPCR: NOT DETECTED
PARAINFLUENZA VIRUS 4-RVPPCR: NOT DETECTED
Parainfluenza Virus 2: NOT DETECTED
Respiratory Syncytial Virus: NOT DETECTED
Rhinovirus / Enterovirus: NOT DETECTED

## 2017-02-02 LAB — COMPREHENSIVE METABOLIC PANEL
ALK PHOS: 50 U/L (ref 38–126)
ALT: 16 U/L (ref 14–54)
ANION GAP: 3 — AB (ref 5–15)
AST: 21 U/L (ref 15–41)
Albumin: 2.9 g/dL — ABNORMAL LOW (ref 3.5–5.0)
BILIRUBIN TOTAL: 0.7 mg/dL (ref 0.3–1.2)
BUN: 21 mg/dL — ABNORMAL HIGH (ref 6–20)
CALCIUM: 8.2 mg/dL — AB (ref 8.9–10.3)
CO2: 27 mmol/L (ref 22–32)
CREATININE: 1.04 mg/dL — AB (ref 0.44–1.00)
Chloride: 113 mmol/L — ABNORMAL HIGH (ref 101–111)
GFR calc Af Amer: 58 mL/min — ABNORMAL LOW (ref >60)
GFR calc non Af Amer: 50 mL/min — ABNORMAL LOW (ref >60)
Glucose, Bld: 159 mg/dL — ABNORMAL HIGH (ref 65–99)
Potassium: 4.4 mmol/L (ref 3.5–5.1)
Sodium: 143 mmol/L (ref 135–145)
TOTAL PROTEIN: 5.7 g/dL — AB (ref 6.5–8.1)

## 2017-02-02 LAB — CBC
HCT: 40.5 % (ref 36.0–46.0)
HEMOGLOBIN: 12.6 g/dL (ref 12.0–15.0)
MCH: 27.8 pg (ref 26.0–34.0)
MCHC: 31.1 g/dL (ref 30.0–36.0)
MCV: 89.4 fL (ref 78.0–100.0)
PLATELETS: 153 10*3/uL (ref 150–400)
RBC: 4.53 MIL/uL (ref 3.87–5.11)
RDW: 14.4 % (ref 11.5–15.5)
WBC: 5.7 10*3/uL (ref 4.0–10.5)

## 2017-02-02 LAB — HIV ANTIBODY (ROUTINE TESTING W REFLEX): HIV Screen 4th Generation wRfx: NONREACTIVE

## 2017-02-02 MED ORDER — DICLOFENAC SODIUM 1 % TD GEL
1.0000 "application " | Freq: Two times a day (BID) | TRANSDERMAL | Status: DC | PRN
  Filled 2017-02-02: qty 100

## 2017-02-02 MED ORDER — IPRATROPIUM-ALBUTEROL 0.5-2.5 (3) MG/3ML IN SOLN
3.0000 mL | Freq: Four times a day (QID) | RESPIRATORY_TRACT | Status: DC
  Administered 2017-02-02 – 2017-02-03 (×4): 3 mL via RESPIRATORY_TRACT
  Filled 2017-02-02 (×4): qty 3

## 2017-02-02 MED ORDER — AZITHROMYCIN 500 MG PO TABS
500.0000 mg | ORAL_TABLET | Freq: Once | ORAL | Status: AC
  Administered 2017-02-03: 500 mg via ORAL
  Filled 2017-02-02: qty 1

## 2017-02-02 MED ORDER — PHOSPHATIDYLSERINE-DHA-EPA 100-19.5-6.5 MG PO CAPS: 1.0000 | ORAL_CAPSULE | Freq: Every day | ORAL | Status: DC

## 2017-02-02 MED ORDER — PREDNISONE 20 MG PO TABS
40.0000 mg | ORAL_TABLET | Freq: Every day | ORAL | Status: DC
Start: 2017-02-02 — End: 2017-02-03
  Administered 2017-02-02 – 2017-02-03 (×2): 40 mg via ORAL
  Filled 2017-02-02: qty 2

## 2017-02-02 MED ORDER — FLUTICASONE PROPIONATE 50 MCG/ACT NA SUSP
2.0000 | Freq: Every day | NASAL | Status: DC
  Filled 2017-02-02: qty 16

## 2017-02-02 MED ORDER — ALBUTEROL SULFATE (2.5 MG/3ML) 0.083% IN NEBU: 5.0000 mg | INHALATION_SOLUTION | RESPIRATORY_TRACT | Status: DC | PRN

## 2017-02-02 NOTE — Progress Notes (Signed)
PROGRESS NOTE    Virginia [name]  [id number] DOB: [birth year] DOA: 02/01/2017 PCP: Lauree Chandler, NP   Outpatient Specialists:     Brief Narrative:  Virginia [name] is a 81 y.o. female with medical history significant for dementia on Aricept, hypertension, chronic diastolic heart failure on Lasix, COPD GOLD IV not on oxygen at home, dyslipidemia. Patient was noted with increasing confusion this morning. According to her brother the patient called out to him stating she could not get out of bed. Typically she does not utilize a walker or other assist devices but due to gait instability and generalized weakness she had to utilize a rolling walker to get out of bed and out of her room. She remained confused and appeared pale so they checked her blood pressure at home and apparently this was in the [id number] range systolic. Subsequently they called EMS. Her initial blood pressure was 12/04/1956 upon arrival but did decrease to as low as 80/47. She was given 300 mL normal saline bolus in the field. Her room air saturations were 88% so she was placed on 2 L of oxygen. She had not had a Lasix dosage today. Her brother reported abrupt onset of poor oral intake beginning yesterday. She has not had any fevers chills or cough.   Assessment & Plan:   Principal Problem:   Acute respiratory failure with hypoxia (HCC) Active Problems:   Memory disorder   Essential hypertension   Chronic diastolic heart failure (HCC)   COPD GOLD IV   Hyperlipidemia   Acute kidney injury (HCC)   Hypotension   CKD (chronic kidney disease), stage III   Acute respiratory failure with hypoxia  -Patient presents from home with altered mental status, hypotension, audible wheezing and hypoxia with initial chest x-ray unremarkable  -Differential includes COPD exacerbation vs early evolving pneumonia vs hypoventilation from altered mental status -Continue supportive care including oxygen -Influenza A/B negative-  full  respiratory virus panel pending    COPD GOLD IV -Was wheezing upon presentation and this improved with the administration of nebs and Solu-Medrol -Continue DuoNeb q 6 hours with PRN albuterol -start prednisone -Continue home Atrovent nasal spray and Breo Ellipta MDI -Per outpatient pulmonary notes COPD symptoms influenced by reflux so continue PPI - continue empiric Rocephin and Zithromax -Urinary strep negative    Acute kidney injury on stage III chronic kidney disease -Very mild in nature with baseline renal function 29 and 1.20 with current renal function 21 and 1.6 -Hold Lasix -Likely precipitated by hypotension in setting of presumed dehydration -Follow labs    Hypotension -Has resolved with 2500 cc volume in the ER -Continue normal saline at 100/hr 12 hours then decrease to 50/hr -Hold preadmission Lasix and Bystolic -Empiric antibiotics as above -Procalcitonin low -Urinalysis unremarkable -Follow up on blood and urine cultures -Was treated with antibiotics in anyway for bronchitis symptoms -Brother reports consistently labile blood pressure readings at home with some days blood pressure readings being high and some days blood pressure readings being somewhat low but not in the hypotensive range    Essential hypertension -Current blood pressure readings suboptimal so holding Lasix and Bystolic    Chronic diastolic heart failure -Currently appears compensated based on clinical exam and presentation consistent with hypovolemia -BNP pending (obtained in setting of presentation of wheezing and hypoxemia with negative chest x-ray) -Holding diuretic and beta blocker as above -Not on ARB secondary to chronic kidney disease -Daily weights/strict I and O    Memory disorder with acute  encephalopathy -Patient presents with acute encephalopathy evidenced by confusion and worsened paranoia in setting of recent hypotension and suspected occult infection -Continue Aricept and  Namenda -PT/OT evaluation -CM to determine if patient eligible for personal care aide at home since she has female caregivers    Hyperlipidemia  -Continue Lipitor   DVT prophylaxis:    Code Status: Full Code   Family Communication: At bedside  Disposition Plan:     Consultants:      Subjective: Feeling much better today  Objective: Vitals:   02/01/17 2020 02/02/17 0432 02/02/17 0854 02/02/17 1027  BP: 127/65 (!) 144/68    Pulse: 99 98 87   Resp: 18 18    Temp: 98.3 F (36.8 C) 98.1 F (36.7 C)    TempSrc: Oral Oral    SpO2: 97% 99% (!) 88% 96%  Weight:      Height:        Intake/Output Summary (Last 24 hours) at 02/02/17 1108 Last data filed at 02/01/17 1818  Gross per 24 hour  Intake             2050 ml  Output              400 ml  Net             1650 ml   Filed Weights   02/01/17 1316  Weight: 84.4 kg (186 lb)    Examination:  General exam: Appears calm and comfortable  Respiratory system: diminished, no wheezing Cardiovascular system: S1 & S2 heard, RRR. No JVD, murmurs, rubs, gallops or clicks. No pedal edema. Gastrointestinal system: Abdomen is nondistended, soft and nontender. No organomegaly or masses felt. Normal bowel sounds heard. Central nervous system: Alert and oriented. No focal neurological deficits.      Data Reviewed: I have personally reviewed following labs and imaging studies  CBC:  Recent Labs Lab 02/01/17 1450 02/02/17 0511  WBC 9.2 5.7  NEUTROABS 5.8  --   HGB 13.3 12.6  HCT 42.8 40.5  MCV 90.5 89.4  PLT 206 0000000   Basic Metabolic Panel:  Recent Labs Lab 02/01/17 1450 02/01/17 1857 02/02/17 0511  NA 139  --  143  K 3.9  --  4.4  CL 103  --  113*  CO2 30  --  27  GLUCOSE 109*  --  159*  BUN 21*  --  21*  CREATININE 1.60*  --  1.04*  CALCIUM 8.4*  --  8.2*  MG  --  2.1  --   PHOS  --  3.0  --    GFR: Estimated Creatinine Clearance: 45.9 mL/min (by C-G formula based on SCr of 1.04 mg/dL  (H)). Liver Function Tests:  Recent Labs Lab 02/01/17 1450 02/02/17 0511  AST 24 21  ALT 16 16  ALKPHOS 60 50  BILITOT 1.1 0.7  PROT 5.9* 5.7*  ALBUMIN 3.1* 2.9*   No results for input(s): LIPASE, AMYLASE in the last 168 hours. No results for input(s): AMMONIA in the last 168 hours. Coagulation Profile: No results for input(s): INR, PROTIME in the last 168 hours. Cardiac Enzymes: No results for input(s): CKTOTAL, CKMB, CKMBINDEX, TROPONINI in the last 168 hours. BNP (last 3 results) No results for input(s): PROBNP in the last 8760 hours. HbA1C: No results for input(s): HGBA1C in the last 72 hours. CBG: No results for input(s): GLUCAP in the last 168 hours. Lipid Profile: No results for input(s): CHOL, HDL, LDLCALC, TRIG, CHOLHDL, LDLDIRECT in the last 72  hours. Thyroid Function Tests: No results for input(s): TSH, T4TOTAL, FREET4, T3FREE, THYROIDAB in the last 72 hours. Anemia Panel: No results for input(s): VITAMINB12, FOLATE, FERRITIN, TIBC, IRON, RETICCTPCT in the last 72 hours. Urine analysis:    Component Value Date/Time   COLORURINE YELLOW 02/01/2017 1659   APPEARANCEUR HAZY (A) 02/01/2017 1659   LABSPEC 1.017 02/01/2017 1659   PHURINE 5.0 02/01/2017 1659   GLUCOSEU NEGATIVE 02/01/2017 1659   HGBUR NEGATIVE 02/01/2017 1659   BILIRUBINUR NEGATIVE 02/01/2017 1659   KETONESUR NEGATIVE 02/01/2017 1659   PROTEINUR NEGATIVE 02/01/2017 1659   UROBILINOGEN 0.2 11/09/2009 0700   NITRITE NEGATIVE 02/01/2017 1659   LEUKOCYTESUR NEGATIVE 02/01/2017 1659     )No results found for this or any previous visit (from the past 240 hour(s)).    Anti-infectives    Start     Dose/Rate Route Frequency Ordered Stop   02/02/17 1800  azithromycin (ZITHROMAX) 500 mg in dextrose 5 % 250 mL IVPB     500 mg 250 mL/hr over 60 Minutes Intravenous Every 24 hours 02/01/17 1718 02/09/17 1759   02/02/17 1700  cefTRIAXone (ROCEPHIN) 1 g in dextrose 5 % 50 mL IVPB     1 g 100 mL/hr over  30 Minutes Intravenous Every 24 hours 02/01/17 1718 02/09/17 1659   02/01/17 1615  cefTRIAXone (ROCEPHIN) 1 g in dextrose 5 % 50 mL IVPB     1 g 100 mL/hr over 30 Minutes Intravenous  Once 02/01/17 1602 02/01/17 1721   02/01/17 1615  azithromycin (ZITHROMAX) 500 mg in dextrose 5 % 250 mL IVPB     500 mg 250 mL/hr over 60 Minutes Intravenous  Once 02/01/17 1602 02/01/17 1823       Radiology Studies: X-ray Chest Pa And Lateral  Result Date: 02/02/2017 CLINICAL DATA:  Right arm and shoulder pain. Admitted with respiratory failure and hypoxia. EXAM: CHEST  2 VIEW COMPARISON:  02/01/2017; 12/02/2016; 05/23/2016 ; 11/30/2014 FINDINGS: Grossly unchanged enlarged cardiac silhouette and mediastinal contours with atherosclerotic plaque within the thoracic aorta. The lungs remain hyperexpanded with flattening of the diaphragms. No pleural effusion or pneumothorax. Mild cephalization of flow / pulmonary venous congestion without frank evidence of edema. No focal airspace opacities. No acute osseus abnormalities. Old fracture involving the surgical neck of the right humerus, incompletely evaluated though similar to the 11/30/2014 examination. IMPRESSION: 1. Similar findings of lung hyperexpansion and mild pulmonary venous congestion without frank evidence of edema. 2. Old fracture involving the surgical neck of the right humerus, similar to the 10/2014 examination though given history of right arm and shoulder pain, an acute on chronic injury is not excluded, further evaluation with dedicated right shoulder radiographs could performed as clinically indicated. These results will be called to the ordering clinician or representative by the Radiologist Assistant, and communication documented in the PACS or zVision Dashboard. Electronically Signed   By: Sandi Mariscal M.D.   On: 02/02/2017 07:57   Dg Chest 2 View  Result Date: 02/01/2017 CLINICAL DATA:  Altered mental status with hypotension.  Hypoxia. EXAM: CHEST  2  VIEW COMPARISON:  12/02/2016 FINDINGS: The cardiomediastinal silhouette is within normal limits. Aortic atherosclerosis is noted. The lungs are hypoinflated with mild bibasilar opacities suggesting atelectasis. There is no evidence of edema, pleural effusion, or pneumothorax. There is a chronic deformity of the proximal right humerus. IMPRESSION: Mild hypoinflation with bibasilar atelectasis. Electronically Signed   By: Logan Bores M.D.   On: 02/01/2017 14:41  Scheduled Meds: . aspirin  81 mg Oral Daily  . atorvastatin  20 mg Oral Daily  . azithromycin  500 mg Intravenous Q24H  . cefTRIAXone (ROCEPHIN)  IV  1 g Intravenous Q24H  . donepezil  10 mg Oral QHS  . enoxaparin (LOVENOX) injection  40 mg Subcutaneous Q24H  . fluticasone  2 spray Each Nare Daily  . fluticasone furoate-vilanterol  1 puff Inhalation Daily  . ipratropium  2 spray Each Nare BID  . memantine  10 mg Oral BID  . pantoprazole  40 mg Oral Daily  . Phosphatidylserine-DHA-EPA  1 capsule Oral Daily  . predniSONE  40 mg Oral Q breakfast  . sodium chloride flush  3 mL Intravenous Q12H   Continuous Infusions:   LOS: 0 days    Time spent: 25 min    [place], DO [facility] Pager [phone]  If 7PM-7AM, please contact night-coverage [email or web address] Password [email or web address] 02/02/2017, 11:08 AM

## 2017-02-02 NOTE — Care Management Note (Addendum)
Case Management Note  Patient Details  Name: Virginia [name] MRN: [id number] Date of Birth: 12/02/1935  Subjective/Objective:   Pt admitted on 02/01/17 with hypoxia and hypotension.  PTA, pt resided at home alone; she has a history of dementia.  Pt's daughter at bedside, visiting from [place].                   Action/Plan: Met with pt and daughter to discuss discharge planning.  Daughter states she plans to stay with pt upon dc for "a while".  Pt/daughter are agreeable to [facility] services; daughter especially interested in [facility] aide to assist with bathing, as pt has issues with incontinence and does not bathe well.  Provided private duty/sitter list to daughter, as she states she will likely pay privately for help upon her return to [place].  Daughter chooses [facility] for [facility] needs; referral made to [facility].  Start of care 24-48h post dc date.     Addendum 3/6 Virginia Collet RN CM Patient with HH O2 order. Spoke with daughter and patient at bedside. They are aware that tank for transportation home will be provided through [facility] and [facility] will set up concentrator at home today. Referral made to [facility], clinical liaison, [place].   Expected Discharge Date:    02/03/17              Expected Discharge Plan:  [place]  In-House Referral:     Discharge planning Services  CM Consult  Post Acute Care Choice:  Home Health Choice offered to:  Adult Children  DME Arranged:    DME Agency:     HH Arranged:  PT, Nurse's Aide [place] Agency:  [place]  Status of Service:  Completed, signed off  If discussed at [place] of Stay Meetings, dates discussed:    Additional Comments:  Virginia Bodo, RN 02/02/2017, 5:29 PM

## 2017-02-02 NOTE — Progress Notes (Signed)
SATURATION QUALIFICATIONS: (This note is used to comply with regulatory documentation for home oxygen)  Patient Saturations on Room Air at Rest = 88%   Patient Saturations on 2 Liters of oxygen while Ambulating = 94%  Please briefly explain why patient needs home oxygen: Pt with desaturation at all times without O2 Virginia [name], [place]

## 2017-02-02 NOTE — Evaluation (Signed)
Physical Therapy Evaluation Patient Details Name: Virginia [name] MRN: [id number] DOB: [birth year] Today's Date: 02/02/2017   History of Present Illness  81 yo admitted with hypotension and hypoxia. PMHx: dementia, HTN, HLD, COPD  Clinical Impression  Pt pleasant sitting EOB on arrival with O2 off and sats 88% with pt unaware of need for O2. Pt and brother report she normally cares for herself, does not use an AD and has assist nearly all the time if needed at home. Pt with decreased cognition, balance and cardiopulmonary function who will benefit from acute therapy to maximize mobility and safety. Brother educated for need for 24hr supervision given pt inability to problem solve through an emergency to know how to call 911.   sats 88% on RA 94% on 2L with gait 98% on 2L at rest    Follow Up Recommendations Home health PT    Equipment Recommendations  None recommended by PT    Recommendations for Other Services       Precautions / Restrictions Precautions Precautions: Fall      Mobility  Bed Mobility               General bed mobility comments: EOB on arrival  Transfers Overall transfer level: Needs assistance   Transfers: Sit to/from Stand Sit to Stand: Supervision         General transfer comment: supervision for safety and O2  Ambulation/Gait Ambulation/Gait assistance: Supervision Ambulation Distance (Feet): 150 Feet Assistive device: Rolling walker (2 wheeled) Gait Pattern/deviations: Step-through pattern;Decreased stride length   Gait velocity interpretation: at or above normal speed for age/gender General Gait Details: distance limited by need to void and pt fatigue, cues for safety, direction and RW use  Stairs            Wheelchair Mobility    Modified Rankin (Stroke Patients Only)       Balance Overall balance assessment: Needs assistance   Sitting balance-Leahy Scale: Good       Standing balance-Leahy Scale: Fair                                Pertinent Vitals/Pain Pain Assessment: No/denies pain    Home Living Family/patient expects to be discharged to:: Private residence Living Arrangements: Other relatives;Children Available Help at Discharge: Family;Available 24 hours/day Type of Home: House Home Access: Stairs to enter Entrance Stairs-Rails: None Entrance Stairs-Number of Steps: 3 Home Layout: Able to live on main level with bedroom/bathroom Home Equipment: Walker - 2 wheels;Cane - single point;Shower seat      Prior Function Level of Independence: Independent         Comments: pt does not cook or drive, brother lives with her and only goes out briefly for errands     Hand Dominance        Extremity/Trunk Assessment   Upper Extremity Assessment Upper Extremity Assessment: Overall WFL for tasks assessed    Lower Extremity Assessment Lower Extremity Assessment: Overall WFL for tasks assessed    Cervical / Trunk Assessment Cervical / Trunk Assessment: Kyphotic  Communication   Communication: No difficulties  Cognition Arousal/Alertness: Awake/alert Behavior During Therapy: WFL for tasks assessed/performed Overall Cognitive Status: History of cognitive impairments - at baseline                 General Comments: pt unable to state how to call 911, decreased STM and problem solving    General Comments  Exercises     Assessment/Plan    PT Assessment Patient needs continued PT services  PT Problem List Decreased coordination;Decreased activity tolerance;Decreased cognition;Cardiopulmonary status limiting activity;Decreased knowledge of use of DME;Decreased balance;Decreased safety awareness       PT Treatment Interventions Gait training;Patient/family education;Therapeutic activities;Cognitive remediation;DME instruction;Stair training;Functional mobility training    PT Goals (Current goals can be found in the Care Plan section)  Acute Rehab PT  Goals Patient Stated Goal: go home PT Goal Formulation: With patient/family Time For Goal Achievement: 02/09/17 Potential to Achieve Goals: Good    Frequency Min 3X/week   Barriers to discharge   brother reports he is home other than short periods to run errands    Co-evaluation               End of Session Equipment Utilized During Treatment: Gait belt;Oxygen Activity Tolerance: Patient tolerated treatment well Patient left: in chair;with call bell/phone within reach;with family/visitor present;with chair alarm set Nurse Communication: Mobility status PT Visit Diagnosis: Other abnormalities of gait and mobility (R26.89)    Functional Assessment Tool Used: AM-PAC 6 Clicks Basic Mobility Functional Limitation: Mobility: Walking and moving around Mobility: Walking and Moving Around Current Status [id number]): At least 20 percent but less than 40 percent impaired, limited or restricted Mobility: Walking and Moving Around Goal Status [phone]): At least 1 percent but less than 20 percent impaired, limited or restricted    Time: 0822-0846 PT Time Calculation (min) (ACUTE ONLY): 24 min   Charges:   PT Evaluation $PT Eval Moderate Complexity: 1 Procedure     PT G Codes:   PT G-Codes **NOT FOR INPATIENT CLASS** Functional Assessment Tool Used: AM-PAC 6 Clicks Basic Mobility Functional Limitation: Mobility: Walking and moving around Mobility: Walking and Moving Around Current Status [id number]): At least 20 percent but less than 40 percent impaired, limited or restricted Mobility: Walking and Moving Around Goal Status [phone]): At least 1 percent but less than 20 percent impaired, limited or restricted     Skylen Danielsen B Norm Wray 02/02/2017, 9:51 AM  Elwyn Reach, [place]

## 2017-02-02 NOTE — Progress Notes (Signed)
Occupational Therapy Evaluation Patient Details Name: Virginia [name] MRN: [id number] DOB: [birth year] Today's Date: 02/02/2017    History of Present Illness 81 yo admitted with hypotension and hypoxia. PMHx: dementia, HTN, HLD, COPD   Clinical Impression   PTA, pt independent with ADL and mobility and participated in YOGA 3x/wk. Family assists with medication management, finance management and other IADL tasks as needed. Pt with desat to 85 on RA during ADL tasks. O2 98 at rest with 2L O2. Pt's brother states pt is doing "much better" cognitively, but has not returned to her baseline level of function. Pt repeatedly asked "why am I here" and after explanation given, pt continued to ask same question. Discussed recommendation of 24/7 S after DC and brother states he/family will be able to provide this level of care. Pt safe to DC home with 24/7 S when medically stable. Will follow up acutely to educate pt/family on energy conservation and reducing risk of falls.     Follow Up Recommendations  No OT follow up;Supervision/Assistance - 24 hour    Equipment Recommendations  None recommended by OT    Recommendations for Other Services       Precautions / Restrictions Precautions Precautions: Fall Restrictions Weight Bearing Restrictions: No      Mobility Bed Mobility               General bed mobility comments: in chair  Transfers Overall transfer level: Needs assistance   Transfers: Sit to/from Stand Sit to Stand: Supervision         General transfer comment: supervision for safety and O2    Balance Overall balance assessment: Needs assistance   Sitting balance-Leahy Scale: Good       Standing balance-Leahy Scale: Fair                              ADL Overall ADL's : Needs assistance/impaired     Grooming: Set up;Supervision/safety Grooming Details (indicate cue type and reason): Pt stood at sink and requred vc to locate soap and papertowels.      Lower Body Bathing: Set up;Supervison/ safety   Upper Body Dressing : Set up   Lower Body Dressing: Set up   Toilet Transfer: Supervision/safety   Toileting- Clothing Manipulation and Hygiene: Supervision/safety       Functional mobility during ADLs: Supervision/safety General ADL Comments: Desat to 63 on RA     Vision         Perception     Praxis      Pertinent Vitals/Pain Pain Assessment: No/denies pain     Hand Dominance Right   Extremity/Trunk Assessment Upper Extremity Assessment Upper Extremity Assessment: Overall WFL for tasks assessed   Lower Extremity Assessment Lower Extremity Assessment: Overall WFL for tasks assessed   Cervical / Trunk Assessment Cervical / Trunk Assessment: Normal   Communication Communication Communication: No difficulties   Cognition Arousal/Alertness: Awake/alert Behavior During Therapy: WFL for tasks assessed/performed Overall Cognitive Status: Impaired/Different from baseline Area of Impairment: Memory;Awareness;Problem solving     Memory: Decreased short-term memory     Awareness: Anticipatory Problem Solving: Slow processing General Comments: Pt asked repeatedly "why am I here". Situation explained and pt continued to ask. Brother states pt is not at her baseline functionally although she is continuing to improve   General Comments       Exercises       Shoulder Instructions      Home Living  Family/patient expects to be discharged to:: Private residence Living Arrangements: Other relatives;Children Available Help at Discharge: Family;Available 24 hours/day Type of Home: House Home Access: Stairs to enter [employer] of Steps: 3 Entrance Stairs-Rails: None Home Layout: Able to live on main level with bedroom/bathroom     Bathroom Shower/Tub: Walk-in shower;Tub/shower unit Shower/tub characteristics: Door Bathroom Toilet: Standard Bathroom Accessibility: No   Home Equipment: Environmental consultant - 2  wheels;Cane - single point;Shower seat          Prior Functioning/Environment Level of Independence: Independent        Comments: pt does not cook or drive, brother lives with her and only goes out briefly for errands; does Yoga 3 days/week        OT Problem List: Decreased activity tolerance;Decreased safety awareness;Decreased knowledge of use of DME or AE;Cardiopulmonary status limiting activity;Obesity      OT Treatment/Interventions: Self-care/ADL training;Energy conservation;Therapeutic activities;Cognitive remediation/compensation;Patient/family education    OT Goals(Current goals can be found in the care plan section) Acute Rehab OT Goals Patient Stated Goal: go home OT Goal Formulation: With patient Time For Goal Achievement: 02/09/17 Potential to Achieve Goals: Good ADL Goals Additional ADL Goal #1: pt/family will independently  verbalize 3 energy conservation technqiues for ADL tasks Additional ADL Goal #2: Pt/family will independently verbalize 3 strategies to reduce risk of falls.  OT Frequency: Min 2X/week   Barriers to D/C:            Co-evaluation              End of Session Equipment Utilized During Treatment: Gait belt Nurse Communication: Mobility status  Activity Tolerance: Patient tolerated treatment well Patient left: in chair;with call bell/phone within reach;with chair alarm set;with family/visitor present  OT Visit Diagnosis: Unsteadiness on feet (R26.81)                ADL either performed or assessed with clinical judgement  Time: [id number] OT Time Calculation (min): 19 min Charges:  OT General Charges $OT Visit: 1 Procedure OT Evaluation $OT Eval Moderate Complexity: 1 Procedure G-Codes: OT G-codes **NOT FOR INPATIENT CLASS** Functional Assessment Tool Used: Clinical judgement Functional Limitation: Self care Self Care Current Status [id number]): At least 1 percent but less than 20 percent impaired, limited or restricted Self Care  Goal Status [id number]): At least 1 percent but less than 20 percent impaired, limited or restricted   [facility], OT/L  [id number] 02/02/2017  Vonnetta Akey,[place] 02/02/2017, 1:16 PM

## 2017-02-03 ENCOUNTER — Telehealth: Payer: Self-pay

## 2017-02-03 DIAGNOSIS — I10 Essential (primary) hypertension: Secondary | ICD-10-CM

## 2017-02-03 LAB — CBC
HEMATOCRIT: 39.4 % (ref 36.0–46.0)
HEMOGLOBIN: 12.3 g/dL (ref 12.0–15.0)
MCH: 28.1 pg (ref 26.0–34.0)
MCHC: 31.2 g/dL (ref 30.0–36.0)
MCV: 90 fL (ref 78.0–100.0)
Platelets: 182 10*3/uL (ref 150–400)
RBC: 4.38 MIL/uL (ref 3.87–5.11)
RDW: 14.9 % (ref 11.5–15.5)
WBC: 11.8 10*3/uL — ABNORMAL HIGH (ref 4.0–10.5)

## 2017-02-03 LAB — BASIC METABOLIC PANEL
ANION GAP: 9 (ref 5–15)
BUN: 19 mg/dL (ref 6–20)
CALCIUM: 8.8 mg/dL — AB (ref 8.9–10.3)
CO2: 28 mmol/L (ref 22–32)
Chloride: 107 mmol/L (ref 101–111)
Creatinine, Ser: 1.06 mg/dL — ABNORMAL HIGH (ref 0.44–1.00)
GFR calc Af Amer: 57 mL/min — ABNORMAL LOW (ref >60)
GFR calc non Af Amer: 49 mL/min — ABNORMAL LOW (ref >60)
Glucose, Bld: 92 mg/dL (ref 65–99)
Potassium: 4 mmol/L (ref 3.5–5.1)
Sodium: 144 mmol/L (ref 135–145)

## 2017-02-03 LAB — PROCALCITONIN

## 2017-02-03 MED ORDER — IPRATROPIUM-ALBUTEROL 0.5-2.5 (3) MG/3ML IN SOLN: 3.0000 mL | Freq: Four times a day (QID) | RESPIRATORY_TRACT | 0 refills | Status: DC

## 2017-02-03 MED ORDER — DOXYCYCLINE HYCLATE 100 MG PO TABS
100.0000 mg | ORAL_TABLET | Freq: Two times a day (BID) | ORAL | Status: DC
  Administered 2017-02-03: 100 mg via ORAL
  Filled 2017-02-03: qty 1

## 2017-02-03 MED ORDER — LORAZEPAM 0.5 MG PO TABS
0.5000 mg | ORAL_TABLET | Freq: Once | ORAL | Status: AC
Start: 2017-02-03 — End: 2017-02-03
  Administered 2017-02-03: 0.5 mg via ORAL
  Filled 2017-02-03: qty 1

## 2017-02-03 MED ORDER — DOXYCYCLINE HYCLATE 100 MG PO TABS: 100.0000 mg | ORAL_TABLET | Freq: Two times a day (BID) | ORAL | 0 refills | Status: DC

## 2017-02-03 MED ORDER — ALBUTEROL SULFATE (2.5 MG/3ML) 0.083% IN NEBU: 5.0000 mg | INHALATION_SOLUTION | RESPIRATORY_TRACT | 12 refills | Status: DC | PRN

## 2017-02-03 MED ORDER — FUROSEMIDE 40 MG PO TABS: 20.0000 mg | ORAL_TABLET | Freq: Every day | ORAL | 3 refills | Status: DC

## 2017-02-03 NOTE — Telephone Encounter (Signed)
Patient's daughter called to discuss patient's recent behaviors. Patient seems to be acting more aggressively toward family members. Pt was discharged from hospital on 02/03/17 but before being discharged, patient was given Ativan to help calm her. No Rx was provided and daughter wants to know if it is ok to restart prozac.   Janett Billow stated that it is ok to restart prozac but that it would take a while for medication to build in patient's system. Patient has an appointment on 02/09/17 and different alternatives can be addressed then.

## 2017-02-03 NOTE — Progress Notes (Signed)
SATURATION QUALIFICATIONS: (This note is used to comply with regulatory documentation for home oxygen)  Patient Saturations on Room Air at Rest = 95%  Patient Saturations on Room Air while Ambulating = 86%  Patient Saturations on 2 Liters of oxygen while Ambulating = 95%  Please briefly explain why patient needs home oxygen:  Patient ambulated 200 feet and required O2 at 2 L to maintain O2 saturation.  Patient felt "worn out" at 200 feet and had to ride in wheelchair to return to her room.

## 2017-02-03 NOTE — Discharge Summary (Signed)
Physician Discharge Summary  Virginia [name] [id number] DOB: August 03, 1936 DOA: 02/01/2017  PCP: Lauree Chandler, NP  Admit date: 02/01/2017 Discharge date: 02/03/2017   Recommendations for Outpatient Follow-Up:   1. PCP follow up for dementia/behavioral issues- ? seroquel-- may need memory care unit 2. Home health with 24 hour supervision 3. Home O2-- patient did not tolerate steroids   Discharge Diagnosis:   Principal Problem:   Acute respiratory failure with hypoxia (HCC) Active Problems:   Memory disorder   Essential hypertension   Chronic diastolic heart failure (HCC)   COPD GOLD IV   Hyperlipidemia   Acute kidney injury (HCC)   Hypotension   CKD (chronic kidney disease), stage III   Discharge disposition:  Home.    Discharge Condition: Improved.  Diet recommendation: Low sodium, heart healthy.  Carbohydrate-modified.  Wound care: None.   History of Present Illness:   Virginia [name] is a 81 y.o. female with medical history significant for dementia on Aricept, hypertension, chronic diastolic heart failure on Lasix, COPD GOLD IV not on oxygen at home, dyslipidemia. Patient was noted with increasing confusion this morning. According to her brother the patient called out to him stating she could not get out of bed. Typically she does not utilize a walker or other assist devices but due to gait instability and generalized weakness she had to utilize a rolling walker to get out of bed and out of her room. She remained confused and appeared pale so they checked her blood pressure at home and apparently this was in the [id number] range systolic. Subsequently they called EMS. Her initial blood pressure was 12/04/1956 upon arrival but did decrease to as low as 80/47. She was given 300 mL normal saline bolus in the field. Her room air saturations were 88% so she was placed on 2 L of oxygen. She had not had a Lasix dosage today. Her brother reported abrupt onset of poor oral intake  beginning yesterday. She has not had any fevers chills or cough.   Hospital Course by Problem:   Acute respiratory failure with hypoxia  -unable to wean off -patient not able to tolerate steroids  COPD GOLD IV -Was wheezing upon presentation and this improved with the administration of nebs and Solu-Medrol -Continue DuoNeb q 6 hours with PRN albuterol -Continue home Atrovent nasal spray and Breo Ellipta MDI -Per outpatient pulmonary notes COPD symptoms influenced by reflux so continue PPI - PO doxy  Acute kidney injury onstage III chronic kidney disease -resolved -lower dose of lasix as PO intake decreaseing  Hypotension -Has resolved with 2500cc volume in the ER -Continue normal saline at 100/hr12 hours then decrease to 50/hr -suspect due to decreased intake  Essential hypertension   Chronic diastolic heart failure -lasix at lower dose  Memory disorderwith acute encephalopathy -Patient presents with acute encephalopathy evidenced by confusion and worsened paranoia in setting of recent hypotension and suspected occult infection -home health -defer to PCP to start medication for behaviors  Hyperlipidemia -Continue Lipitor   Medical Consultants:    None.   Discharge Exam:   Vitals:   02/03/17 0506 02/03/17 0925  BP: 135/71   Pulse: 90 94  Resp:  18  Temp: 98.5 F (36.9 C)    Vitals:   02/03/17 0216 02/03/17 0506 02/03/17 0925 02/03/17 1035  BP:  135/71    Pulse:  90 94   Resp:   18   Temp:  98.5 F (36.9 C)    TempSrc:  Oral  SpO2: 93% 97% 94% 95%  Weight:      Height:        Gen:  NAD- pleasantly confused    The results of significant diagnostics from this hospitalization (including imaging, microbiology, ancillary and laboratory) are listed below for reference.     Procedures and Diagnostic Studies:   X-ray Chest Pa And Lateral  Result Date: 02/02/2017 CLINICAL DATA:  Right arm and shoulder pain. Admitted with  respiratory failure and hypoxia. EXAM: CHEST  2 VIEW COMPARISON:  02/01/2017; 12/02/2016; 05/23/2016 ; 11/30/2014 FINDINGS: Grossly unchanged enlarged cardiac silhouette and mediastinal contours with atherosclerotic plaque within the thoracic aorta. The lungs remain hyperexpanded with flattening of the diaphragms. No pleural effusion or pneumothorax. Mild cephalization of flow / pulmonary venous congestion without frank evidence of edema. No focal airspace opacities. No acute osseus abnormalities. Old fracture involving the surgical neck of the right humerus, incompletely evaluated though similar to the 11/30/2014 examination. IMPRESSION: 1. Similar findings of lung hyperexpansion and mild pulmonary venous congestion without frank evidence of edema. 2. Old fracture involving the surgical neck of the right humerus, similar to the 10/2014 examination though given history of right arm and shoulder pain, an acute on chronic injury is not excluded, further evaluation with dedicated right shoulder radiographs could performed as clinically indicated. These results will be called to the ordering clinician or representative by the Radiologist Assistant, and communication documented in the PACS or zVision Dashboard. Electronically Signed   By: Sandi Mariscal M.D.   On: 02/02/2017 07:57   Dg Chest 2 View  Result Date: 02/01/2017 CLINICAL DATA:  Altered mental status with hypotension.  Hypoxia. EXAM: CHEST  2 VIEW COMPARISON:  12/02/2016 FINDINGS: The cardiomediastinal silhouette is within normal limits. Aortic atherosclerosis is noted. The lungs are hypoinflated with mild bibasilar opacities suggesting atelectasis. There is no evidence of edema, pleural effusion, or pneumothorax. There is a chronic deformity of the proximal right humerus. IMPRESSION: Mild hypoinflation with bibasilar atelectasis. Electronically Signed   By: Logan Bores M.D.   On: 02/01/2017 14:41     Labs:   Basic Metabolic Panel:  Recent Labs Lab  02/01/17 1450 02/01/17 1857 02/02/17 0511 02/03/17 0656  NA 139  --  143 144  K 3.9  --  4.4 4.0  CL 103  --  113* 107  CO2 30  --  27 28  GLUCOSE 109*  --  159* 92  BUN 21*  --  21* 19  CREATININE 1.60*  --  1.04* 1.06*  CALCIUM 8.4*  --  8.2* 8.8*  MG  --  2.1  --   --   PHOS  --  3.0  --   --    GFR Estimated Creatinine Clearance: 45 mL/min (by C-G formula based on SCr of 1.06 mg/dL (H)). Liver Function Tests:  Recent Labs Lab 02/01/17 1450 02/02/17 0511  AST 24 21  ALT 16 16  ALKPHOS 60 50  BILITOT 1.1 0.7  PROT 5.9* 5.7*  ALBUMIN 3.1* 2.9*   No results for input(s): LIPASE, AMYLASE in the last 168 hours. No results for input(s): AMMONIA in the last 168 hours. Coagulation profile No results for input(s): INR, PROTIME in the last 168 hours.  CBC:  Recent Labs Lab 02/01/17 1450 02/02/17 0511 02/03/17 0656  WBC 9.2 5.7 11.8*  NEUTROABS 5.8  --   --   HGB 13.3 12.6 12.3  HCT 42.8 40.5 39.4  MCV 90.5 89.4 90.0  PLT 206 153 182  Cardiac Enzymes: No results for input(s): CKTOTAL, CKMB, CKMBINDEX, TROPONINI in the last 168 hours. BNP: Invalid input(s): POCBNP CBG: No results for input(s): GLUCAP in the last 168 hours. D-Dimer No results for input(s): DDIMER in the last 72 hours. Hgb A1c No results for input(s): HGBA1C in the last 72 hours. Lipid Profile No results for input(s): CHOL, HDL, LDLCALC, TRIG, CHOLHDL, LDLDIRECT in the last 72 hours. Thyroid function studies No results for input(s): TSH, T4TOTAL, T3FREE, THYROIDAB in the last 72 hours.  Invalid input(s): FREET3 Anemia work up No results for input(s): VITAMINB12, FOLATE, FERRITIN, TIBC, IRON, RETICCTPCT in the last 72 hours. Microbiology Recent Results (from the past 240 hour(s))  Blood Culture (routine x 2)     Status: None (Preliminary result)   Collection Time: 02/01/17  2:50 PM  Result Value Ref Range Status   Specimen Description BLOOD RIGHT ANTECUBITAL  Final   Special Requests  BOTTLES DRAWN AEROBIC ONLY 5CC  Final   Culture NO GROWTH < 24 HOURS  Final   Report Status PENDING  Incomplete  Blood Culture (routine x 2)     Status: None (Preliminary result)   Collection Time: 02/01/17  2:54 PM  Result Value Ref Range Status   Specimen Description BLOOD RIGHT FOREARM  Final   Special Requests BOTTLES DRAWN AEROBIC ONLY 5CC  Final   Culture NO GROWTH < 24 HOURS  Final   Report Status PENDING  Incomplete  Respiratory Panel by PCR     Status: None   Collection Time: 02/01/17  3:13 PM  Result Value Ref Range Status   Adenovirus NOT DETECTED NOT DETECTED Final   Coronavirus 229E NOT DETECTED NOT DETECTED Final   Coronavirus HKU1 NOT DETECTED NOT DETECTED Final   Coronavirus NL63 NOT DETECTED NOT DETECTED Final   Coronavirus OC43 NOT DETECTED NOT DETECTED Final   Metapneumovirus NOT DETECTED NOT DETECTED Final   Rhinovirus / Enterovirus NOT DETECTED NOT DETECTED Final   Influenza A NOT DETECTED NOT DETECTED Final   Influenza B NOT DETECTED NOT DETECTED Final   Parainfluenza Virus 1 NOT DETECTED NOT DETECTED Final   Parainfluenza Virus 2 NOT DETECTED NOT DETECTED Final   Parainfluenza Virus 3 NOT DETECTED NOT DETECTED Final   Parainfluenza Virus 4 NOT DETECTED NOT DETECTED Final   Respiratory Syncytial Virus NOT DETECTED NOT DETECTED Final   Bordetella pertussis NOT DETECTED NOT DETECTED Final   Chlamydophila pneumoniae NOT DETECTED NOT DETECTED Final   Mycoplasma pneumoniae NOT DETECTED NOT DETECTED Final     Discharge Instructions:   Discharge Instructions    Diet - low sodium heart healthy    Complete by:  As directed    Discharge instructions    Complete by:  As directed    Home O2 Home health 24 hour supervision   Increase activity slowly    Complete by:  As directed      Allergies as of 02/03/2017      Reactions   Lidocaine Anaphylaxis, Swelling, Rash, Other (See Comments)   Any of the " Caine  Family "   Other Other (See Comments)   All drugs  that end in "cane'-  novacane etc. Daughter states patient almost died when she had it when she was born      Medication List    TAKE these medications   albuterol (2.5 MG/3ML) 0.083% nebulizer solution Commonly known as:  PROVENTIL Take 6 mLs (5 mg total) by nebulization every 4 (four) hours as needed for wheezing or  shortness of breath.   ARTIFICIAL TEAR OP Place 2 drops into both eyes daily.   aspirin EC 81 MG tablet Take 81 mg by mouth every evening. What changed:  Another medication with the same name was removed. Continue taking this medication, and follow the directions you see here.   atorvastatin 20 MG tablet Commonly known as:  LIPITOR take 1 tablet by mouth once daily What changed:  See the new instructions.   b complex vitamins capsule Take 1 capsule by mouth 2 (two) times a week.   BYSTOLIC 5 MG tablet Generic drug:  nebivolol take 1 tablet by mouth once daily   CALCIUM-D PO Take 1 tablet by mouth every evening.   cetirizine 10 MG tablet Commonly known as:  ZYRTEC Take 10 mg by mouth daily.   diclofenac sodium 1 % Gel Commonly known as:  VOLTAREN Apply 1 application topically 2 (two) times daily as needed (shoulder and knee pain).   donepezil 10 MG tablet Commonly known as:  ARICEPT Take 10 mg by mouth daily.   doxycycline 100 MG tablet Commonly known as:  VIBRA-TABS Take 1 tablet (100 mg total) by mouth every 12 (twelve) hours.   fluticasone 50 MCG/ACT nasal spray Commonly known as:  FLONASE Place 2 sprays into both nostrils daily.   fluticasone furoate-vilanterol 100-25 MCG/INH Aepb Commonly known as:  BREO ELLIPTA Inhale 1 puff into the lungs daily.   furosemide 40 MG tablet Commonly known as:  LASIX Take 0.5 tablets (20 mg total) by mouth daily. What changed:  See the new instructions.   ipratropium 0.06 % nasal spray Commonly known as:  ATROVENT Place 2 sprays into both nostrils 2 (two) times daily. What changed:  when to take this     ipratropium-albuterol 0.5-2.5 (3) MG/3ML Soln Commonly known as:  DUONEB Take 3 mLs by nebulization every 6 (six) hours.   memantine 10 MG tablet Commonly known as:  NAMENDA Take 1 tablet (10 mg total) by mouth 2 (two) times daily.   ondansetron 4 MG disintegrating tablet Commonly known as:  ZOFRAN ODT Take 1 tablet (4 mg total) by mouth every morning. What changed:  when to take this  reasons to take this   pantoprazole 40 MG tablet Commonly known as:  PROTONIX Take 1 tablet (40 mg total) by mouth daily.   PRESERVISION AREDS 2 Caps Take 1 capsule by mouth 2 (two) times daily.   ranitidine 150 MG tablet Commonly known as:  ZANTAC Take 150 mg by mouth daily.   VAYACOG 100-19.5-6.5 MG Caps Generic drug:  Phosphatidylserine-DHA-EPA take 1 capsule by mouth once daily            Durable Medical Equipment        Start     Ordered   02/03/17 1119  For home use only DME oxygen  Once    Question Answer Comment  Mode or (Route) Nasal cannula   Liters per Minute 2   Frequency Continuous (stationary and portable oxygen unit needed)   Oxygen delivery system Gas      02/03/17 1118     Follow-up Information    EUBANKS, Briarrose Shor K, NP Follow up in 1 week(s).   Specialty:  Geriatric Medicine Contact information: [address] [place] [address] [phone]            Time coordinating discharge: 35 min  Signed:  [place]   [facility] 02/03/2017, 11:27 AM

## 2017-02-04 ENCOUNTER — Telehealth: Payer: Self-pay | Admitting: *Deleted

## 2017-02-04 ENCOUNTER — Telehealth: Payer: Self-pay

## 2017-02-04 ENCOUNTER — Other Ambulatory Visit: Payer: Self-pay | Admitting: Internal Medicine

## 2017-02-04 ENCOUNTER — Ambulatory Visit: Payer: Medicare Other | Admitting: Allergy & Immunology

## 2017-02-04 DIAGNOSIS — I5032 Chronic diastolic (congestive) heart failure: Secondary | ICD-10-CM | POA: Diagnosis not present

## 2017-02-04 DIAGNOSIS — Z9981 Dependence on supplemental oxygen: Secondary | ICD-10-CM | POA: Diagnosis not present

## 2017-02-04 DIAGNOSIS — Z7982 Long term (current) use of aspirin: Secondary | ICD-10-CM | POA: Diagnosis not present

## 2017-02-04 DIAGNOSIS — N183 Chronic kidney disease, stage 3 (moderate): Secondary | ICD-10-CM | POA: Diagnosis not present

## 2017-02-04 DIAGNOSIS — F0391 Unspecified dementia with behavioral disturbance: Secondary | ICD-10-CM | POA: Diagnosis not present

## 2017-02-04 DIAGNOSIS — K219 Gastro-esophageal reflux disease without esophagitis: Secondary | ICD-10-CM

## 2017-02-04 DIAGNOSIS — I130 Hypertensive heart and chronic kidney disease with heart failure and stage 1 through stage 4 chronic kidney disease, or unspecified chronic kidney disease: Secondary | ICD-10-CM | POA: Diagnosis not present

## 2017-02-04 DIAGNOSIS — J441 Chronic obstructive pulmonary disease with (acute) exacerbation: Secondary | ICD-10-CM | POA: Diagnosis not present

## 2017-02-04 DIAGNOSIS — Z7951 Long term (current) use of inhaled steroids: Secondary | ICD-10-CM | POA: Diagnosis not present

## 2017-02-04 NOTE — Telephone Encounter (Signed)
Oxygen is a medication that has to be prescribed and the patient has to have low oxygen levels to qualify for this. If her O2 was not low enough in hospital she would have have applied for this. The nurse can assess her O2 levels and let us know, if they are low she can qualify for home O2

## 2017-02-04 NOTE — Telephone Encounter (Signed)
Museum/gallery conservator with [facility] called requesting verbal order for PT, Education officer, museum, Skilled Nursing and OT. Verbal orders given.

## 2017-02-04 NOTE — Telephone Encounter (Signed)
Transition Care Management Follow-Up Telephone Call   Date discharged and where: 02/01/17 from [facility]  How have you been since you were released from the hospital? Per brother she has been worn out and getting used to the 24/7 oxygen. She is not used to being on it all the time.  Any patient concerns? None that he knows of.  Items Reviewed:   Meds: Yes  Allergies: Yes  Dietary Changes Reviewed: Heart Healthy  Functional Questionnaire:  Independent-I Dependent-D  ADLs:   Dressing- I    Eating- I   Maintaining continence- unsure   Transferring- Independent w assist (walker sometimes)   Transportation-D   Meal Prep-D   Managing Meds- D  Confirmed importance and Date/Time of follow-up visits scheduled: Yes   Confirmed with patient if condition worsens to call PCP or go to the Emergency Dept. Patient was given office number and encouraged to call back with questions or concerns:  Yes

## 2017-02-04 NOTE — Telephone Encounter (Signed)
Patient daughter, [name] called and stated that patient has had no Nebulizer Treatments since released from hospital with her brother also on 24 hour usage of Oxygen. She stated that she spoke with a Respiratory Therapist and he stated that patient Should Not be on 24 hour/day Oxygen because over use could be damaging. Daughter is going to clean the Nebulizer machine and give her a treatment but she is frustrated. Wants to know if she should be on 24 hours oxygen.  [facility] PT is there in home now. Going to get nurse to go in home for teaching. Patient has an appointment with you for hospital follow up on Monday. Please Advise.

## 2017-02-04 NOTE — Telephone Encounter (Signed)
Patient IS on Oxygen 24/7 and daughter is worried that that is too much. She stated that while patient is sitting her O2 level is good. But when she moves around it drops. Daughter is concerned that 24/7 oxygen is not good for patient and wants your advise.

## 2017-02-05 DIAGNOSIS — Z9981 Dependence on supplemental oxygen: Secondary | ICD-10-CM | POA: Diagnosis not present

## 2017-02-05 DIAGNOSIS — J441 Chronic obstructive pulmonary disease with (acute) exacerbation: Secondary | ICD-10-CM | POA: Diagnosis not present

## 2017-02-05 DIAGNOSIS — F0391 Unspecified dementia with behavioral disturbance: Secondary | ICD-10-CM | POA: Diagnosis not present

## 2017-02-05 DIAGNOSIS — I130 Hypertensive heart and chronic kidney disease with heart failure and stage 1 through stage 4 chronic kidney disease, or unspecified chronic kidney disease: Secondary | ICD-10-CM | POA: Diagnosis not present

## 2017-02-05 DIAGNOSIS — N183 Chronic kidney disease, stage 3 (moderate): Secondary | ICD-10-CM | POA: Diagnosis not present

## 2017-02-05 DIAGNOSIS — I5032 Chronic diastolic (congestive) heart failure: Secondary | ICD-10-CM | POA: Diagnosis not present

## 2017-02-05 NOTE — Telephone Encounter (Signed)
Tried calling [name], daughter and mail box is full and cannot leave message.

## 2017-02-05 NOTE — Telephone Encounter (Signed)
Oh okay, may take O2 off and check O2 periodically with pulse oximeter --make sure the have a pulse oximeter (can buy at [employer] or drug store) to check oxygen level-- goal is to keep sats over 92% if she is okay off O2 that's fine and they can just apply O2 as needed for sats under 92%

## 2017-02-06 LAB — CULTURE, BLOOD (ROUTINE X 2)
CULTURE: NO GROWTH
CULTURE: NO GROWTH

## 2017-02-07 DIAGNOSIS — J441 Chronic obstructive pulmonary disease with (acute) exacerbation: Secondary | ICD-10-CM | POA: Diagnosis not present

## 2017-02-07 DIAGNOSIS — F0391 Unspecified dementia with behavioral disturbance: Secondary | ICD-10-CM | POA: Diagnosis not present

## 2017-02-07 DIAGNOSIS — Z9981 Dependence on supplemental oxygen: Secondary | ICD-10-CM | POA: Diagnosis not present

## 2017-02-07 DIAGNOSIS — I5032 Chronic diastolic (congestive) heart failure: Secondary | ICD-10-CM | POA: Diagnosis not present

## 2017-02-07 DIAGNOSIS — N183 Chronic kidney disease, stage 3 (moderate): Secondary | ICD-10-CM | POA: Diagnosis not present

## 2017-02-07 DIAGNOSIS — I130 Hypertensive heart and chronic kidney disease with heart failure and stage 1 through stage 4 chronic kidney disease, or unspecified chronic kidney disease: Secondary | ICD-10-CM | POA: Diagnosis not present

## 2017-02-09 ENCOUNTER — Ambulatory Visit: Payer: Medicare Other | Admitting: Nurse Practitioner

## 2017-02-10 DIAGNOSIS — N183 Chronic kidney disease, stage 3 (moderate): Secondary | ICD-10-CM | POA: Diagnosis not present

## 2017-02-10 DIAGNOSIS — Z9981 Dependence on supplemental oxygen: Secondary | ICD-10-CM | POA: Diagnosis not present

## 2017-02-10 DIAGNOSIS — F0391 Unspecified dementia with behavioral disturbance: Secondary | ICD-10-CM | POA: Diagnosis not present

## 2017-02-10 DIAGNOSIS — I5032 Chronic diastolic (congestive) heart failure: Secondary | ICD-10-CM | POA: Diagnosis not present

## 2017-02-10 DIAGNOSIS — J441 Chronic obstructive pulmonary disease with (acute) exacerbation: Secondary | ICD-10-CM | POA: Diagnosis not present

## 2017-02-10 DIAGNOSIS — I130 Hypertensive heart and chronic kidney disease with heart failure and stage 1 through stage 4 chronic kidney disease, or unspecified chronic kidney disease: Secondary | ICD-10-CM | POA: Diagnosis not present

## 2017-02-11 DIAGNOSIS — N183 Chronic kidney disease, stage 3 (moderate): Secondary | ICD-10-CM | POA: Diagnosis not present

## 2017-02-11 DIAGNOSIS — F0391 Unspecified dementia with behavioral disturbance: Secondary | ICD-10-CM | POA: Diagnosis not present

## 2017-02-11 DIAGNOSIS — J441 Chronic obstructive pulmonary disease with (acute) exacerbation: Secondary | ICD-10-CM | POA: Diagnosis not present

## 2017-02-11 DIAGNOSIS — I5032 Chronic diastolic (congestive) heart failure: Secondary | ICD-10-CM | POA: Diagnosis not present

## 2017-02-11 DIAGNOSIS — I130 Hypertensive heart and chronic kidney disease with heart failure and stage 1 through stage 4 chronic kidney disease, or unspecified chronic kidney disease: Secondary | ICD-10-CM | POA: Diagnosis not present

## 2017-02-11 DIAGNOSIS — Z9981 Dependence on supplemental oxygen: Secondary | ICD-10-CM | POA: Diagnosis not present

## 2017-02-12 DIAGNOSIS — F0391 Unspecified dementia with behavioral disturbance: Secondary | ICD-10-CM | POA: Diagnosis not present

## 2017-02-12 DIAGNOSIS — I130 Hypertensive heart and chronic kidney disease with heart failure and stage 1 through stage 4 chronic kidney disease, or unspecified chronic kidney disease: Secondary | ICD-10-CM | POA: Diagnosis not present

## 2017-02-12 DIAGNOSIS — I5032 Chronic diastolic (congestive) heart failure: Secondary | ICD-10-CM | POA: Diagnosis not present

## 2017-02-12 DIAGNOSIS — Z9981 Dependence on supplemental oxygen: Secondary | ICD-10-CM | POA: Diagnosis not present

## 2017-02-12 DIAGNOSIS — N183 Chronic kidney disease, stage 3 (moderate): Secondary | ICD-10-CM | POA: Diagnosis not present

## 2017-02-12 DIAGNOSIS — J441 Chronic obstructive pulmonary disease with (acute) exacerbation: Secondary | ICD-10-CM | POA: Diagnosis not present

## 2017-02-13 DIAGNOSIS — I130 Hypertensive heart and chronic kidney disease with heart failure and stage 1 through stage 4 chronic kidney disease, or unspecified chronic kidney disease: Secondary | ICD-10-CM | POA: Diagnosis not present

## 2017-02-13 DIAGNOSIS — Z9981 Dependence on supplemental oxygen: Secondary | ICD-10-CM | POA: Diagnosis not present

## 2017-02-13 DIAGNOSIS — F0391 Unspecified dementia with behavioral disturbance: Secondary | ICD-10-CM | POA: Diagnosis not present

## 2017-02-13 DIAGNOSIS — N183 Chronic kidney disease, stage 3 (moderate): Secondary | ICD-10-CM | POA: Diagnosis not present

## 2017-02-13 DIAGNOSIS — I5032 Chronic diastolic (congestive) heart failure: Secondary | ICD-10-CM | POA: Diagnosis not present

## 2017-02-13 DIAGNOSIS — J441 Chronic obstructive pulmonary disease with (acute) exacerbation: Secondary | ICD-10-CM | POA: Diagnosis not present

## 2017-02-16 ENCOUNTER — Ambulatory Visit (INDEPENDENT_AMBULATORY_CARE_PROVIDER_SITE_OTHER): Payer: Medicare Other | Admitting: Nurse Practitioner

## 2017-02-16 ENCOUNTER — Encounter: Payer: Self-pay | Admitting: Nurse Practitioner

## 2017-02-16 VITALS — BP 122/68 | HR 78 | Temp 98.6°F | Resp 17 | Ht 63.0 in | Wt 189.6 lb

## 2017-02-16 DIAGNOSIS — N183 Chronic kidney disease, stage 3 (moderate): Secondary | ICD-10-CM | POA: Diagnosis not present

## 2017-02-16 DIAGNOSIS — F419 Anxiety disorder, unspecified: Secondary | ICD-10-CM | POA: Diagnosis not present

## 2017-02-16 DIAGNOSIS — I10 Essential (primary) hypertension: Secondary | ICD-10-CM

## 2017-02-16 DIAGNOSIS — I5032 Chronic diastolic (congestive) heart failure: Secondary | ICD-10-CM | POA: Diagnosis not present

## 2017-02-16 DIAGNOSIS — J441 Chronic obstructive pulmonary disease with (acute) exacerbation: Secondary | ICD-10-CM

## 2017-02-16 DIAGNOSIS — K219 Gastro-esophageal reflux disease without esophagitis: Secondary | ICD-10-CM

## 2017-02-16 DIAGNOSIS — F0391 Unspecified dementia with behavioral disturbance: Secondary | ICD-10-CM | POA: Diagnosis not present

## 2017-02-16 DIAGNOSIS — Z9981 Dependence on supplemental oxygen: Secondary | ICD-10-CM | POA: Diagnosis not present

## 2017-02-16 DIAGNOSIS — I130 Hypertensive heart and chronic kidney disease with heart failure and stage 1 through stage 4 chronic kidney disease, or unspecified chronic kidney disease: Secondary | ICD-10-CM | POA: Diagnosis not present

## 2017-02-16 LAB — CBC WITH DIFFERENTIAL/PLATELET
BASOS PCT: 0 %
Basophils Absolute: 0 cells/uL (ref 0–200)
EOS PCT: 1 %
Eosinophils Absolute: 91 cells/uL (ref 15–500)
HCT: 42.1 % (ref 35.0–45.0)
Hemoglobin: 13.4 g/dL (ref 11.7–15.5)
LYMPHS PCT: 21 %
Lymphs Abs: 1911 cells/uL (ref 850–3900)
MCH: 28 pg (ref 27.0–33.0)
MCHC: 31.8 g/dL — ABNORMAL LOW (ref 32.0–36.0)
MCV: 88.1 fL (ref 80.0–100.0)
MONO ABS: 910 {cells}/uL (ref 200–950)
MONOS PCT: 10 %
MPV: 10.3 fL (ref 7.5–12.5)
NEUTROS ABS: 6188 {cells}/uL (ref 1500–7800)
Neutrophils Relative %: 68 %
PLATELETS: 239 10*3/uL (ref 140–400)
RBC: 4.78 MIL/uL (ref 3.80–5.10)
RDW: 14.6 % (ref 11.0–15.0)
WBC: 9.1 10*3/uL (ref 3.8–10.8)

## 2017-02-16 LAB — COMPLETE METABOLIC PANEL WITH GFR
ALT: 15 U/L (ref 6–29)
AST: 23 U/L (ref 10–35)
Albumin: 3.5 g/dL — ABNORMAL LOW (ref 3.6–5.1)
Alkaline Phosphatase: 59 U/L (ref 33–130)
BUN: 16 mg/dL (ref 7–25)
CALCIUM: 8.9 mg/dL (ref 8.6–10.4)
CHLORIDE: 105 mmol/L (ref 98–110)
CO2: 28 mmol/L (ref 20–31)
Creat: 1.15 mg/dL — ABNORMAL HIGH (ref 0.60–0.93)
GFR, Est African American: 53 mL/min — ABNORMAL LOW (ref >=60)
GFR, Est Non African American: 46 mL/min — ABNORMAL LOW (ref >=60)
Glucose, Bld: 99 mg/dL (ref 65–99)
POTASSIUM: 4.3 mmol/L (ref 3.5–5.3)
Sodium: 143 mmol/L (ref 135–146)
Total Bilirubin: 0.8 mg/dL (ref 0.2–1.2)
Total Protein: 5.8 g/dL — ABNORMAL LOW (ref 6.1–8.1)

## 2017-02-16 MED ORDER — PANTOPRAZOLE SODIUM 40 MG PO TBEC: 40.0000 mg | DELAYED_RELEASE_TABLET | Freq: Every day | ORAL | 2 refills | Status: DC

## 2017-02-16 NOTE — Progress Notes (Signed)
Careteam: Patient Care Team: Virginia Chandler, NP as PCP - General (Nurse Practitioner) Virginia Records, MD as Consulting Physician (Cardiology) Virginia Rockers, MD as Consulting Physician (Pulmonary Disease)  Advanced Directive information Does Patient Have a Medical Advance Directive?: Yes  Allergies  Allergen Reactions  . Lidocaine Anaphylaxis, Swelling, Rash and Other (See Comments)    Any of the " [facility] "  . Other Other (See Comments)    All drugs that end in "cane'-  novacane etc. Daughter states patient almost died when she had it when she was born    Chief Complaint  Patient presents with  . Transitions Of Care    Pt was admitted at [facility] from 02/01/17 to 02/04/17     HPI: Patient is a 81 y.o. female seen in the office today for hospital follow up.  Pt with medical history significant for dementia on Aricept, hypertension, chronic diastolic heart failure on Lasix, COPD GOLD IVnot on oxygen at home, dyslipidemia.   Patient was noted with increasing confusion this morning. According to her brother the patient called out to him stating she could not get out of bed. Blood pressure, o2 were low so they called EMS. Pt originally did not want to go to the hospital but was then agreeable. Pt was dx with COPD exacerbation with acute respiratory failure with hypoxia. She was discharged from hospital with doxycycline for 7 days which she has completed. Also discharged on duoneb scheduled q 6 but only using daily.  Also started on home O2 continuously. Therapy working with her at home but have not checked O2 level while working with her Son here at visit and checked her off O2 and it was 94% which is what it was prior to hospitalization.  Pt with increase confusion and paranoia in the hospital. Was started on ativan during hospitalization however at home she was on this and she had decreased LOC while on medication and hard to arouse.  Son here today and reports Virginia [name]'s  anxiety is a lot worse around her daughter but overall reports anxiety and agitated well controlled.  Increase agitated since more people have been in the home.  Sleeping better now that she is home.   Review of Systems:  Review of Systems  Constitutional: Negative for activity change, appetite change, chills and fever.  HENT: Negative for congestion, ear pain, hearing loss, postnasal drip, rhinorrhea, sinus pain, sinus pressure and sore throat.   Respiratory: Negative for cough, shortness of breath and wheezing.   Cardiovascular: Negative for chest pain, palpitations and leg swelling.  Gastrointestinal: Negative for abdominal pain, constipation and diarrhea.  Musculoskeletal: Positive for arthralgias and myalgias.       Pain in shoulder and knee  Neurological: Negative for weakness and headaches.  Psychiatric/Behavioral: Positive for agitation (improved) and confusion. Negative for dysphoric mood and sleep disturbance. The patient is not nervous/anxious.     Past Medical History:  Diagnosis Date  . Carotid artery occlusion   . CKD (chronic kidney disease)   . COPD (chronic obstructive pulmonary disease) ([place])   . Diverticulosis   . Fall at home Sept. 2013  . Hyperlipidemia   . Hypertension   . Internal hemorrhoid   . Memory disorder 10/24/2014   Past Surgical History:  Procedure Laterality Date  . APPENDECTOMY    . BREAST REDUCTION SURGERY    . CAROTID ENDARTERECTOMY     left CEA  . CATARACT EXTRACTION Bilateral   .  COLONOSCOPY  2015  . EYE SURGERY     cataracts removed, bilaterally   . skin cancer removal    . TONSILLECTOMY     Social History:   reports that she quit smoking about 22 years ago. Her smoking use included Cigarettes. She quit after 14.00 years of use. She has never used smokeless tobacco. She reports that she does not drink alcohol or use drugs.  Family History  Problem Relation Age of Onset  . Heart disease Father     Before age 32  . Hypertension  Father   . Heart attack Father   . Cancer Mother 39    Brain  . Dementia Brother   . Cancer Maternal Aunt 61    breast cancer  . Diabetes Maternal Aunt   . Heart failure Maternal Grandmother   . Diabetes Maternal Grandmother   . Stroke Neg Hx     Medications: Patient's Medications  New Prescriptions   No medications on file  Previous Medications   ALBUTEROL (PROVENTIL) (2.5 MG/3ML) 0.083% NEBULIZER SOLUTION    Take 6 mLs (5 mg total) by nebulization every 4 (four) hours as needed for wheezing or shortness of breath.   ARTIFICIAL TEAR OP    Place 2 drops into both eyes daily.    ASPIRIN EC 81 MG TABLET    Take 81 mg by mouth every evening.   ATORVASTATIN (LIPITOR) 20 MG TABLET    take 1 tablet by mouth once daily   B COMPLEX VITAMINS CAPSULE    Take 1 capsule by mouth 2 (two) times a week.    BYSTOLIC 5 MG TABLET    take 1 tablet by mouth once daily   CALCIUM CARBONATE-VITAMIN D (CALCIUM-D PO)    Take 1 tablet by mouth every evening.   CETIRIZINE (ZYRTEC) 10 MG TABLET    Take 10 mg by mouth daily.   DICLOFENAC SODIUM (VOLTAREN) 1 % GEL    Apply 1 application topically 2 (two) times daily as needed (shoulder and knee pain).   DONEPEZIL (ARICEPT) 10 MG TABLET    Take 10 mg by mouth daily.    FLUTICASONE (FLONASE) 50 MCG/ACT NASAL SPRAY    Place 2 sprays into both nostrils daily.   FLUTICASONE FUROATE-VILANTEROL (BREO ELLIPTA) 100-25 MCG/INH AEPB    Inhale 1 puff into the lungs daily.   FUROSEMIDE (LASIX) 40 MG TABLET    Take 0.5 tablets (20 mg total) by mouth daily.   IPRATROPIUM (ATROVENT) 0.06 % NASAL SPRAY    Place 2 sprays into both nostrils 2 (two) times daily.   IPRATROPIUM-ALBUTEROL (DUONEB) 0.5-2.5 (3) MG/3ML SOLN    Take 3 mLs by nebulization every 6 (six) hours.   MEMANTINE (NAMENDA) 10 MG TABLET    Take 1 tablet (10 mg total) by mouth 2 (two) times daily.   MULTIPLE VITAMINS-MINERALS (PRESERVISION AREDS 2) CAPS    Take 1 capsule by mouth 2 (two) times daily.    ONDANSETRON  (ZOFRAN ODT) 4 MG DISINTEGRATING TABLET    Take 1 tablet (4 mg total) by mouth every morning.   PANTOPRAZOLE (PROTONIX) 40 MG TABLET    Take 1 tablet (40 mg total) by mouth daily.   RANITIDINE (ZANTAC) 150 MG TABLET    Take 150 mg by mouth daily.   VAYACOG 100-19.5-6.5 MG CAPS    take 1 capsule by mouth once daily  Modified Medications   No medications on file  Discontinued Medications   DOXYCYCLINE (VIBRA-TABS) 100 MG TABLET  Take 1 tablet (100 mg total) by mouth every 12 (twelve) hours.     Physical Exam:  Vitals:   02/16/17 1414  BP: 122/68  Pulse: 78  Resp: 17  Temp: 98.6 F (37 C)  TempSrc: Oral  SpO2: 98%  Weight: 189 lb 9.6 oz (86 kg)  Height: _0  (1.6 m)   Body mass index is 33.59 kg/m.  Physical Exam  Constitutional: She appears well-developed and well-nourished.  HENT:  Head: Normocephalic and atraumatic.  Nose: Nose normal.  Mouth/Throat: Oropharynx is clear and moist. No oropharyngeal exudate.  Eyes: Conjunctivae and EOM are normal. Pupils are equal, round, and reactive to light.  Neck: Normal range of motion. Neck supple.  Cardiovascular: Normal rate, regular rhythm and normal heart sounds.   Pulmonary/Chest: Effort normal. She has no wheezes.  Abdominal: Soft. Bowel sounds are normal. She exhibits no distension.  Neurological: She is alert.    Labs reviewed: Basic Metabolic Panel:  Recent Labs  02/01/17 1450 02/01/17 1857 02/02/17 0511 02/03/17 0656  NA 139  --  143 144  K 3.9  --  4.4 4.0  CL 103  --  113* 107  CO2 30  --  27 28  GLUCOSE 109*  --  159* 92  BUN 21*  --  21* 19  CREATININE 1.60*  --  1.04* 1.06*  CALCIUM 8.4*  --  8.2* 8.8*  MG  --  2.1  --   --   PHOS  --  3.0  --   --    Liver Function Tests:  Recent Labs  02/01/17 1450 02/02/17 0511  AST 24 21  ALT 16 16  ALKPHOS 60 50  BILITOT 1.1 0.7  PROT 5.9* 5.7*  ALBUMIN 3.1* 2.9*   No results for input(s): LIPASE, AMYLASE in the last 8760 hours. No results for  input(s): AMMONIA in the last 8760 hours. CBC:  Recent Labs  02/01/17 1450 02/02/17 0511 02/03/17 0656  WBC 9.2 5.7 11.8*  NEUTROABS 5.8  --   --   HGB 13.3 12.6 12.3  HCT 42.8 40.5 39.4  MCV 90.5 89.4 90.0  PLT 206 153 182   Lipid Panel: No results for input(s): CHOL, HDL, LDLCALC, TRIG, CHOLHDL, LDLDIRECT in the last 8760 hours. TSH: No results for input(s): TSH in the last 8760 hours. A1C: Lab Results  Component Value Date   HGBA1C 5.6 04/12/2015     Assessment/Plan 1. COPD exacerbation ([place]) Has resolved, to cont home regimen. Competed doxycyline. May use nebulizer as needed at this time. O2 was removed during office visit and O2 maintained greater than 92%, encouraged to spot check at home and may use O2 PRN for O2 less than 92% - CMP with eGFR - CBC with Differential/Platelets  2. Gastroesophageal reflux disease without esophagitis - pantoprazole (PROTONIX) 40 MG tablet; Take 1 tablet (40 mg total) by mouth daily.  Dispense: 90 tablet; Refill: 2  3. Dementia with behavioral disturbance, unspecified dementia type Stable now that she is back at home with brother and son. conts on aricept and namenda.   4. Anxiety Overall stable now that she is back at home.   5. Chronic diastolic congestive heart failure (HCC) euvolemic, lasix reduced to 20 mg daily due to decrease in overall PO intake. Will follow up BUN/CR at this time.   6. Essential hypertension Blood pressure stable. To cont bystolic and lasix daily   To keep follow up appt next month.   Carlos American. Harle Battiest  [place] [phone] 8 am - 5 pm) [phone] (after hours)

## 2017-02-16 NOTE — Patient Instructions (Signed)
Okay to keep Oxygen off for saturation 92% or greater

## 2017-02-18 DIAGNOSIS — I5032 Chronic diastolic (congestive) heart failure: Secondary | ICD-10-CM | POA: Diagnosis not present

## 2017-02-18 DIAGNOSIS — J441 Chronic obstructive pulmonary disease with (acute) exacerbation: Secondary | ICD-10-CM | POA: Diagnosis not present

## 2017-02-18 DIAGNOSIS — I130 Hypertensive heart and chronic kidney disease with heart failure and stage 1 through stage 4 chronic kidney disease, or unspecified chronic kidney disease: Secondary | ICD-10-CM | POA: Diagnosis not present

## 2017-02-18 DIAGNOSIS — F0391 Unspecified dementia with behavioral disturbance: Secondary | ICD-10-CM | POA: Diagnosis not present

## 2017-02-18 DIAGNOSIS — N183 Chronic kidney disease, stage 3 (moderate): Secondary | ICD-10-CM | POA: Diagnosis not present

## 2017-02-18 DIAGNOSIS — Z9981 Dependence on supplemental oxygen: Secondary | ICD-10-CM | POA: Diagnosis not present

## 2017-02-19 DIAGNOSIS — I5032 Chronic diastolic (congestive) heart failure: Secondary | ICD-10-CM | POA: Diagnosis not present

## 2017-02-19 DIAGNOSIS — J441 Chronic obstructive pulmonary disease with (acute) exacerbation: Secondary | ICD-10-CM | POA: Diagnosis not present

## 2017-02-19 DIAGNOSIS — Z9981 Dependence on supplemental oxygen: Secondary | ICD-10-CM | POA: Diagnosis not present

## 2017-02-19 DIAGNOSIS — F0391 Unspecified dementia with behavioral disturbance: Secondary | ICD-10-CM | POA: Diagnosis not present

## 2017-02-19 DIAGNOSIS — N183 Chronic kidney disease, stage 3 (moderate): Secondary | ICD-10-CM | POA: Diagnosis not present

## 2017-02-19 DIAGNOSIS — I130 Hypertensive heart and chronic kidney disease with heart failure and stage 1 through stage 4 chronic kidney disease, or unspecified chronic kidney disease: Secondary | ICD-10-CM | POA: Diagnosis not present

## 2017-02-20 DIAGNOSIS — I130 Hypertensive heart and chronic kidney disease with heart failure and stage 1 through stage 4 chronic kidney disease, or unspecified chronic kidney disease: Secondary | ICD-10-CM | POA: Diagnosis not present

## 2017-02-20 DIAGNOSIS — N183 Chronic kidney disease, stage 3 (moderate): Secondary | ICD-10-CM | POA: Diagnosis not present

## 2017-02-20 DIAGNOSIS — F0391 Unspecified dementia with behavioral disturbance: Secondary | ICD-10-CM | POA: Diagnosis not present

## 2017-02-20 DIAGNOSIS — J441 Chronic obstructive pulmonary disease with (acute) exacerbation: Secondary | ICD-10-CM | POA: Diagnosis not present

## 2017-02-20 DIAGNOSIS — I5032 Chronic diastolic (congestive) heart failure: Secondary | ICD-10-CM | POA: Diagnosis not present

## 2017-02-20 DIAGNOSIS — Z9981 Dependence on supplemental oxygen: Secondary | ICD-10-CM | POA: Diagnosis not present

## 2017-02-25 DIAGNOSIS — I130 Hypertensive heart and chronic kidney disease with heart failure and stage 1 through stage 4 chronic kidney disease, or unspecified chronic kidney disease: Secondary | ICD-10-CM | POA: Diagnosis not present

## 2017-02-25 DIAGNOSIS — Z9981 Dependence on supplemental oxygen: Secondary | ICD-10-CM | POA: Diagnosis not present

## 2017-02-25 DIAGNOSIS — J441 Chronic obstructive pulmonary disease with (acute) exacerbation: Secondary | ICD-10-CM | POA: Diagnosis not present

## 2017-02-25 DIAGNOSIS — N183 Chronic kidney disease, stage 3 (moderate): Secondary | ICD-10-CM | POA: Diagnosis not present

## 2017-02-25 DIAGNOSIS — I5032 Chronic diastolic (congestive) heart failure: Secondary | ICD-10-CM | POA: Diagnosis not present

## 2017-02-25 DIAGNOSIS — F0391 Unspecified dementia with behavioral disturbance: Secondary | ICD-10-CM | POA: Diagnosis not present

## 2017-03-04 DIAGNOSIS — I130 Hypertensive heart and chronic kidney disease with heart failure and stage 1 through stage 4 chronic kidney disease, or unspecified chronic kidney disease: Secondary | ICD-10-CM | POA: Diagnosis not present

## 2017-03-04 DIAGNOSIS — Z9981 Dependence on supplemental oxygen: Secondary | ICD-10-CM | POA: Diagnosis not present

## 2017-03-04 DIAGNOSIS — I5032 Chronic diastolic (congestive) heart failure: Secondary | ICD-10-CM | POA: Diagnosis not present

## 2017-03-04 DIAGNOSIS — J441 Chronic obstructive pulmonary disease with (acute) exacerbation: Secondary | ICD-10-CM | POA: Diagnosis not present

## 2017-03-04 DIAGNOSIS — F0391 Unspecified dementia with behavioral disturbance: Secondary | ICD-10-CM | POA: Diagnosis not present

## 2017-03-04 DIAGNOSIS — N183 Chronic kidney disease, stage 3 (moderate): Secondary | ICD-10-CM | POA: Diagnosis not present

## 2017-03-10 DIAGNOSIS — F0391 Unspecified dementia with behavioral disturbance: Secondary | ICD-10-CM | POA: Diagnosis not present

## 2017-03-10 DIAGNOSIS — I5032 Chronic diastolic (congestive) heart failure: Secondary | ICD-10-CM | POA: Diagnosis not present

## 2017-03-10 DIAGNOSIS — J441 Chronic obstructive pulmonary disease with (acute) exacerbation: Secondary | ICD-10-CM | POA: Diagnosis not present

## 2017-03-10 DIAGNOSIS — Z9981 Dependence on supplemental oxygen: Secondary | ICD-10-CM | POA: Diagnosis not present

## 2017-03-10 DIAGNOSIS — N183 Chronic kidney disease, stage 3 (moderate): Secondary | ICD-10-CM | POA: Diagnosis not present

## 2017-03-10 DIAGNOSIS — I130 Hypertensive heart and chronic kidney disease with heart failure and stage 1 through stage 4 chronic kidney disease, or unspecified chronic kidney disease: Secondary | ICD-10-CM | POA: Diagnosis not present

## 2017-03-17 DIAGNOSIS — J441 Chronic obstructive pulmonary disease with (acute) exacerbation: Secondary | ICD-10-CM | POA: Diagnosis not present

## 2017-03-17 DIAGNOSIS — I130 Hypertensive heart and chronic kidney disease with heart failure and stage 1 through stage 4 chronic kidney disease, or unspecified chronic kidney disease: Secondary | ICD-10-CM | POA: Diagnosis not present

## 2017-03-17 DIAGNOSIS — I5032 Chronic diastolic (congestive) heart failure: Secondary | ICD-10-CM | POA: Diagnosis not present

## 2017-03-17 DIAGNOSIS — F0391 Unspecified dementia with behavioral disturbance: Secondary | ICD-10-CM | POA: Diagnosis not present

## 2017-03-17 DIAGNOSIS — N183 Chronic kidney disease, stage 3 (moderate): Secondary | ICD-10-CM | POA: Diagnosis not present

## 2017-03-17 DIAGNOSIS — Z9981 Dependence on supplemental oxygen: Secondary | ICD-10-CM | POA: Diagnosis not present

## 2017-03-19 ENCOUNTER — Telehealth: Payer: Self-pay

## 2017-03-19 NOTE — Telephone Encounter (Signed)
Margaretha Sheffield with [facility] called to request verbal order for skilled nursing.  Per [employer] standing order, verbal order given. Message will be sent to patient's provider as a FYI.    Margaretha Sheffield also states patient with fluctuating blood pressure readings. Lasix was cut in half at the hospital   Margaretha Sheffield was informed I will follow-up with patient on blood pressure

## 2017-03-20 NOTE — Telephone Encounter (Signed)
Left message on voicemail for patient to return call when available   

## 2017-03-24 ENCOUNTER — Telehealth: Payer: Self-pay | Admitting: *Deleted

## 2017-03-24 DIAGNOSIS — J3489 Other specified disorders of nose and nasal sinuses: Secondary | ICD-10-CM

## 2017-03-24 DIAGNOSIS — N183 Chronic kidney disease, stage 3 (moderate): Secondary | ICD-10-CM | POA: Diagnosis not present

## 2017-03-24 DIAGNOSIS — Z9981 Dependence on supplemental oxygen: Secondary | ICD-10-CM | POA: Diagnosis not present

## 2017-03-24 DIAGNOSIS — I130 Hypertensive heart and chronic kidney disease with heart failure and stage 1 through stage 4 chronic kidney disease, or unspecified chronic kidney disease: Secondary | ICD-10-CM | POA: Diagnosis not present

## 2017-03-24 DIAGNOSIS — I5032 Chronic diastolic (congestive) heart failure: Secondary | ICD-10-CM | POA: Diagnosis not present

## 2017-03-24 DIAGNOSIS — J441 Chronic obstructive pulmonary disease with (acute) exacerbation: Secondary | ICD-10-CM | POA: Diagnosis not present

## 2017-03-24 DIAGNOSIS — F0391 Unspecified dementia with behavioral disturbance: Secondary | ICD-10-CM | POA: Diagnosis not present

## 2017-03-24 NOTE — Telephone Encounter (Signed)
[name], Virginia [name] dropped off a list of BP readings per Virginia [name]'s request:  2/24- 107/71   76 2/25- 107/86   81 2/26- 120/82   75 2/27-  115/76  70 2/28-  139/90  69 3/01-  117/77  75 3/02-  124/87  74 3/03-  94/82    73 3/04-  46/27    94 3/07-  148/96  97 3/08-  147/109 83 3/09-  133/87   84 3/10-  100/76  88 3/11-  107/90  87 3/12-  141/95  84 3/13-  126/80  84 3/14-  123/88  88 3/15-  129/86  67 3/16-  139/90  74 3/17-  160/100 65 3/18-  110/70   71 3/19-  97/70    71 3/20-  88/58   78 4/02-  139/91 73 4/03-  94/63   80 4/04-  173/103 75 4/05-  123/82  81 4/06- 126/86  76 4/07- 147/92   4/08- 116/71  72 4/09- 104/65  72 4/10- 122/75  76 4/11- 116/78  80 4/12- 143/86  72 4/13-  138/83 4/14- 131/78 4/15- 88/56  75 4/16- 138/86  87 4/17- 123/76  4/18- 129/77  75 4/19- 144/97  80  4/20- 138/87  77 4/21- 104/64  81 4/22- 141/92  70 4/23- 136/82  69 4/24- 126/72

## 2017-03-26 ENCOUNTER — Ambulatory Visit: Payer: Medicare Other

## 2017-03-26 MED ORDER — IPRATROPIUM BROMIDE 0.06 % NA SOLN: 2.0000 | Freq: Two times a day (BID) | NASAL | 1 refills | Status: DC

## 2017-03-26 NOTE — Telephone Encounter (Signed)
Continue current medications without change

## 2017-03-26 NOTE — Telephone Encounter (Signed)
As noted in phone note dated 03/19/17 patient's home health nurse had some concerns with fluctuating blood pressure. Message was left on voicemail for patient to call with B/P readings to be reviewed and advised on by a NP or MD.  Janett Billow is out of office, will forward to covering provider Dr.Green

## 2017-03-27 NOTE — Telephone Encounter (Signed)
Discussed with patient, patient verbalized understanding of Dr.Green's recommendation. Patient was without question or concern at the time of call

## 2017-03-30 ENCOUNTER — Ambulatory Visit (INDEPENDENT_AMBULATORY_CARE_PROVIDER_SITE_OTHER): Payer: Medicare Other | Admitting: Nurse Practitioner

## 2017-03-30 ENCOUNTER — Ambulatory Visit (INDEPENDENT_AMBULATORY_CARE_PROVIDER_SITE_OTHER): Payer: Medicare Other

## 2017-03-30 ENCOUNTER — Ambulatory Visit: Payer: Medicare Other

## 2017-03-30 ENCOUNTER — Encounter: Payer: Self-pay | Admitting: Nurse Practitioner

## 2017-03-30 VITALS — BP 130/60 | HR 90 | Temp 98.5°F | Resp 18 | Ht 63.0 in | Wt 195.0 lb

## 2017-03-30 VITALS — BP 130/60 | HR 90 | Temp 98.5°F | Ht 63.0 in | Wt 195.0 lb

## 2017-03-30 DIAGNOSIS — Z0000 Encounter for general adult medical examination without abnormal findings: Secondary | ICD-10-CM | POA: Diagnosis not present

## 2017-03-30 DIAGNOSIS — F0391 Unspecified dementia with behavioral disturbance: Secondary | ICD-10-CM | POA: Diagnosis not present

## 2017-03-30 DIAGNOSIS — J449 Chronic obstructive pulmonary disease, unspecified: Secondary | ICD-10-CM | POA: Diagnosis not present

## 2017-03-30 DIAGNOSIS — I5032 Chronic diastolic (congestive) heart failure: Secondary | ICD-10-CM | POA: Diagnosis not present

## 2017-03-30 DIAGNOSIS — Z23 Encounter for immunization: Secondary | ICD-10-CM | POA: Diagnosis not present

## 2017-03-30 DIAGNOSIS — N3281 Overactive bladder: Secondary | ICD-10-CM

## 2017-03-30 DIAGNOSIS — I10 Essential (primary) hypertension: Secondary | ICD-10-CM

## 2017-03-30 DIAGNOSIS — K219 Gastro-esophageal reflux disease without esophagitis: Secondary | ICD-10-CM

## 2017-03-30 NOTE — Patient Instructions (Addendum)
To elevate legs when you are sitting 2 gm sodium diet Use compression hose during the day- remove at bedtime To increase lasix to 40 mg daily for 3 days then decrease back to 20 mg daily Make follow up with cardiology for routine visit    Scheduled bathroom trips every 2 hours and when you leave the house and before events even if you feel like you do not need to go Make sure to stay hydrated even if you feel like you need to use the bathroom frequently    DASH Eating Plan DASH stands for "Dietary Approaches to Stop Hypertension." The DASH eating plan is a healthy eating plan that has been shown to reduce high blood pressure (hypertension). It may also reduce your risk for type 2 diabetes, heart disease, and stroke. The DASH eating plan may also help with weight loss. What are tips for following this plan? General guidelines   Avoid eating more than 2,300 mg (milligrams) of salt (sodium) a day. If you have hypertension, you may need to reduce your sodium intake to 1,500 mg a day.  Limit alcohol intake to no more than 1 drink a day for nonpregnant women and 2 drinks a day for men. One drink equals 12 oz of beer, 5 oz of wine, or 1 oz of hard liquor.  Work with your health care provider to maintain a healthy body weight or to lose weight. Ask what an ideal weight is for you.  Get at least 30 minutes of exercise that causes your heart to beat faster (aerobic exercise) most days of the week. Activities may include walking, swimming, or biking.  Work with your health care provider or diet and nutrition specialist (dietitian) to adjust your eating plan to your individual calorie needs. Reading food labels   Check food labels for the amount of sodium per serving. Choose foods with less than 5 percent of the Daily Value of sodium. Generally, foods with less than 300 mg of sodium per serving fit into this eating plan.  To find whole grains, look for the word "whole" as the first word in the  ingredient list. Shopping   Buy products labeled as "low-sodium" or "no salt added."  Buy fresh foods. Avoid canned foods and premade or frozen meals. Cooking   Avoid adding salt when cooking. Use salt-free seasonings or herbs instead of table salt or sea salt. Check with your health care provider or pharmacist before using salt substitutes.  Do not fry foods. Cook foods using healthy methods such as baking, boiling, grilling, and broiling instead.  Cook with heart-healthy oils, such as olive, canola, soybean, or sunflower oil. Meal planning    Eat a balanced diet that includes:  5 or more servings of fruits and vegetables each day. At each meal, try to fill half of your plate with fruits and vegetables.  Up to 6-8 servings of whole grains each day.  Less than 6 oz of lean meat, poultry, or fish each day. A 3-oz serving of meat is about the same size as a deck of cards. One egg equals 1 oz.  2 servings of low-fat dairy each day.  A serving of nuts, seeds, or beans 5 times each week.  Heart-healthy fats. Healthy fats called Omega-3 fatty acids are found in foods such as flaxseeds and coldwater fish, like sardines, salmon, and mackerel.  Limit how much you eat of the following:  Canned or prepackaged foods.  Food that is high in trans fat, such  as fried foods.  Food that is high in saturated fat, such as fatty meat.  Sweets, desserts, sugary drinks, and other foods with added sugar.  Full-fat dairy products.  Do not salt foods before eating.  Try to eat at least 2 vegetarian meals each week.  Eat more home-cooked food and less restaurant, buffet, and fast food.  When eating at a restaurant, ask that your food be prepared with less salt or no salt, if possible. What foods are recommended? The items listed may not be a complete list. Talk with your dietitian about what dietary choices are best for you. Grains  Whole-grain or whole-wheat bread. Whole-grain or  whole-wheat pasta. Brown rice. [place]. Bulgur. Whole-grain and low-sodium cereals. Pita bread. Low-fat, low-sodium crackers. Whole-wheat flour tortillas. Vegetables  Fresh or frozen vegetables (raw, steamed, roasted, or grilled). Low-sodium or reduced-sodium tomato and vegetable juice. Low-sodium or reduced-sodium tomato sauce and tomato paste. Low-sodium or reduced-sodium canned vegetables. Fruits  All fresh, dried, or frozen fruit. Canned fruit in natural juice (without added sugar). Meat and other protein foods  Skinless chicken or [place]. Ground chicken or [place]. Pork with fat trimmed off. Fish and seafood. Egg whites. Dried beans, peas, or lentils. Unsalted nuts, nut butters, and seeds. Unsalted canned beans. Lean cuts of beef with fat trimmed off. Low-sodium, lean deli meat. Dairy  Low-fat (1%) or fat-free (skim) milk. Fat-free, low-fat, or reduced-fat cheeses. Nonfat, low-sodium ricotta or cottage cheese. Low-fat or nonfat yogurt. Low-fat, low-sodium cheese. Fats and oils  Soft margarine without trans fats. Vegetable oil. Low-fat, reduced-fat, or light mayonnaise and salad dressings (reduced-sodium). Canola, safflower, olive, soybean, and sunflower oils. Avocado. Seasoning and other foods  Herbs. Spices. Seasoning mixes without salt. Unsalted popcorn and pretzels. Fat-free sweets. What foods are not recommended? The items listed may not be a complete list. Talk with your dietitian about what dietary choices are best for you. Grains  Baked goods made with fat, such as croissants, muffins, or some breads. Dry pasta or rice meal packs. Vegetables  Creamed or fried vegetables. Vegetables in a cheese sauce. Regular canned vegetables (not low-sodium or reduced-sodium). Regular canned tomato sauce and paste (not low-sodium or reduced-sodium). Regular tomato and vegetable juice (not low-sodium or reduced-sodium). Angie Fava. Olives. Fruits  Canned fruit in a light or heavy syrup. Fried  fruit. Fruit in cream or butter sauce. Meat and other protein foods  Fatty cuts of meat. Ribs. Fried meat. Berniece Salines. Sausage. Bologna and other processed lunch meats. Salami. Fatback. Hotdogs. Bratwurst. Salted nuts and seeds. Canned beans with added salt. Canned or smoked fish. Whole eggs or egg yolks. Chicken or [place] with skin. Dairy  Whole or 2% milk, cream, and half-and-half. Whole or full-fat cream cheese. Whole-fat or sweetened yogurt. Full-fat cheese. Nondairy creamers. Whipped toppings. Processed cheese and cheese spreads. Fats and oils  Butter. Stick margarine. Lard. Shortening. Ghee. Bacon fat. Tropical oils, such as coconut, palm kernel, or palm oil. Seasoning and other foods  Salted popcorn and pretzels. Onion salt, garlic salt, seasoned salt, table salt, and sea salt. Worcestershire sauce. Tartar sauce. Barbecue sauce. Teriyaki sauce. Soy sauce, including reduced-sodium. Steak sauce. Canned and packaged gravies. Fish sauce. Oyster sauce. Cocktail sauce. Horseradish that you find on the shelf. Ketchup. Mustard. Meat flavorings and tenderizers. Bouillon cubes. Hot sauce and Tabasco sauce. Premade or packaged marinades. Premade or packaged taco seasonings. Relishes. Regular salad dressings. Where to find more information:  National Heart, Lung, and [place]: https://wilson-eaton.com/  American Heart Association: www.heart.org Summary  The DASH eating plan is a healthy eating plan that has been shown to reduce high blood pressure (hypertension). It may also reduce your risk for type 2 diabetes, heart disease, and stroke.  With the DASH eating plan, you should limit salt (sodium) intake to 2,300 mg a day. If you have hypertension, you may need to reduce your sodium intake to 1,500 mg a day.  When on the DASH eating plan, aim to eat more fresh fruits and vegetables, whole grains, lean proteins, low-fat dairy, and heart-healthy fats.  Work with your health care provider or diet and  nutrition specialist (dietitian) to adjust your eating plan to your individual calorie needs. This information is not intended to replace advice given to you by your health care provider. Make sure you discuss any questions you have with your health care provider. Document Released: 11/06/2011 Document Revised: 11/10/2016 Document Reviewed: 11/10/2016 Elsevier Interactive Patient Education  2017 [employer].

## 2017-03-30 NOTE — Patient Instructions (Signed)
Virginia [name] , Thank you for taking time to come for your Medicare Wellness Visit. I appreciate your ongoing commitment to your health goals. Please review the following plan we discussed and let me know if I can assist you in the future.   Screening recommendations/referrals: Colonoscopy up to date   Mammogram up to date Bone Density up to date Recommended yearly ophthalmology/optometry visit for glaucoma screening and checkup Recommended yearly dental visit for hygiene and checkup  Vaccinations: Influenza vaccine up to date Pneumococcal vaccine up to date. PN13 given today.  Tdap vaccine due. Prescription ordered. Shingles vaccine due.  Prescription ordered  Advanced directives: Copy requested.  Conditions/risks identified: None  Next appointment: Virginia Mustache, NP 4/30@ 3:15pm   Preventive Care 34 Years and Older, Female Preventive care refers to lifestyle choices and visits with your health care provider that can promote health and wellness. What does preventive care include?  A yearly physical exam. This is also called an annual well check.  Dental exams once or twice a year.  Routine eye exams. Ask your health care provider how often you should have your eyes checked.  Personal lifestyle choices, including:  Daily care of your teeth and gums.  Regular physical activity.  Eating a healthy diet.  Avoiding tobacco and drug use.  Limiting alcohol use.  Practicing safe sex.  Taking low-dose aspirin every day.  Taking vitamin and mineral supplements as recommended by your health care provider. What happens during an annual well check? The services and screenings done by your health care provider during your annual well check will depend on your age, overall health, lifestyle risk factors, and family history of disease. Counseling  Your health care provider may ask you questions about your:  Alcohol use.  Tobacco use.  Drug use.  Emotional  well-being.  Home and relationship well-being.  Sexual activity.  Eating habits.  History of falls.  Memory and ability to understand (cognition).  Work and work Statistician.  Reproductive health. Screening  You may have the following tests or measurements:  Height, weight, and BMI.  Blood pressure.  Lipid and cholesterol levels. These may be checked every 5 years, or more frequently if you are over 6 years old.  Skin check.  Lung cancer screening. You may have this screening every year starting at age 41 if you have a 30-pack-year history of smoking and currently smoke or have quit within the past 15 years.  Fecal occult blood test (FOBT) of the stool. You may have this test every year starting at age 66.  Flexible sigmoidoscopy or colonoscopy. You may have a sigmoidoscopy every 5 years or a colonoscopy every 10 years starting at age 64.  Hepatitis C blood test.  Hepatitis B blood test.  Sexually transmitted disease (STD) testing.  Diabetes screening. This is done by checking your blood sugar (glucose) after you have not eaten for a while (fasting). You may have this done every 1-3 years.  Bone density scan. This is done to screen for osteoporosis. You may have this done starting at age 76.  Mammogram. This may be done every 1-2 years. Talk to your health care provider about how often you should have regular mammograms. Talk with your health care provider about your test results, treatment options, and if necessary, the need for more tests. Vaccines  Your health care provider may recommend certain vaccines, such as:  Influenza vaccine. This is recommended every year.  Tetanus, diphtheria, and acellular pertussis (Tdap, Td) vaccine. You may  need a Td booster every 10 years.  Zoster vaccine. You may need this after age 60.  Pneumococcal 13-valent conjugate (PCV13) vaccine. One dose is recommended after age 31.  Pneumococcal polysaccharide (PPSV23) vaccine. One  dose is recommended after age 33. Talk to your health care provider about which screenings and vaccines you need and how often you need them. This information is not intended to replace advice given to you by your health care provider. Make sure you discuss any questions you have with your health care provider. Document Released: 12/14/2015 Document Revised: 08/06/2016 Document Reviewed: 09/18/2015 Elsevier Interactive Patient Education  2017 [place] Prevention in the Home Falls can cause injuries. They can happen to people of all ages. There are many things you can do to make your home safe and to help prevent falls. What can I do on the outside of my home?  Regularly fix the edges of walkways and driveways and fix any cracks.  Remove anything that might make you trip as you walk through a door, such as a raised step or threshold.  Trim any bushes or trees on the path to your home.  Use bright outdoor lighting.  Clear any walking paths of anything that might make someone trip, such as rocks or tools.  Regularly check to see if handrails are loose or broken. Make sure that both sides of any steps have handrails.  Any raised decks and porches should have guardrails on the edges.  Have any leaves, snow, or ice cleared regularly.  Use sand or salt on walking paths during winter.  Clean up any spills in your garage right away. This includes oil or grease spills. What can I do in the bathroom?  Use night lights.  Install grab bars by the toilet and in the tub and shower. Do not use towel bars as grab bars.  Use non-skid mats or decals in the tub or shower.  If you need to sit down in the shower, use a plastic, non-slip stool.  Keep the floor dry. Clean up any water that spills on the floor as soon as it happens.  Remove soap buildup in the tub or shower regularly.  Attach bath mats securely with double-sided non-slip rug tape.  Do not have throw rugs and other  things on the floor that can make you trip. What can I do in the bedroom?  Use night lights.  Make sure that you have a light by your bed that is easy to reach.  Do not use any sheets or blankets that are too big for your bed. They should not hang down onto the floor.  Have a firm chair that has side arms. You can use this for support while you get dressed.  Do not have throw rugs and other things on the floor that can make you trip. What can I do in the kitchen?  Clean up any spills right away.  Avoid walking on wet floors.  Keep items that you use a lot in easy-to-reach places.  If you need to reach something above you, use a strong step stool that has a grab bar.  Keep electrical cords out of the way.  Do not use floor polish or wax that makes floors slippery. If you must use wax, use non-skid floor wax.  Do not have throw rugs and other things on the floor that can make you trip. What can I do with my stairs?  Do not leave any items on  the stairs.  Make sure that there are handrails on both sides of the stairs and use them. Fix handrails that are broken or loose. Make sure that handrails are as long as the stairways.  Check any carpeting to make sure that it is firmly attached to the stairs. Fix any carpet that is loose or worn.  Avoid having throw rugs at the top or bottom of the stairs. If you do have throw rugs, attach them to the floor with carpet tape.  Make sure that you have a light switch at the top of the stairs and the bottom of the stairs. If you do not have them, ask someone to add them for you. What else can I do to help prevent falls?  Wear shoes that:  Do not have high heels.  Have rubber bottoms.  Are comfortable and fit you well.  Are closed at the toe. Do not wear sandals.  If you use a stepladder:  Make sure that it is fully opened. Do not climb a closed stepladder.  Make sure that both sides of the stepladder are locked into place.  Ask  someone to hold it for you, if possible.  Clearly mark and make sure that you can see:  Any grab bars or handrails.  First and last steps.  Where the edge of each step is.  Use tools that help you move around (mobility aids) if they are needed. These include:  Canes.  Walkers.  Scooters.  Crutches.  Turn on the lights when you go into a dark area. Replace any light bulbs as soon as they burn out.  Set up your furniture so you have a clear path. Avoid moving your furniture around.  If any of your floors are uneven, fix them.  If there are any pets around you, be aware of where they are.  Review your medicines with your doctor. Some medicines can make you feel dizzy. This can increase your chance of falling. Ask your doctor what other things that you can do to help prevent falls. This information is not intended to replace advice given to you by your health care provider. Make sure you discuss any questions you have with your health care provider. Document Released: 09/13/2009 Document Revised: 04/24/2016 Document Reviewed: 12/22/2014 Elsevier Interactive Patient Education  2017 [employer].

## 2017-03-30 NOTE — Progress Notes (Signed)
Careteam: Patient Care Team: Virginia Chandler, NP as PCP - General (Nurse Practitioner) Virginia Records, MD as Consulting Physician (Cardiology) Virginia Rockers, MD as Consulting Physician (Pulmonary Disease)    Allergies  Allergen Reactions  . Lidocaine Anaphylaxis, Swelling, Rash and Other (See Comments)    Any of the " [facility] "  . Other Other (See Comments)    All drugs that end in "cane'-  novacane etc. Daughter states patient almost died when she had it when she was born    Chief Complaint  Patient presents with  . Medical Management of Chronic Issues    Pt is being seen for a 3 month routine follow up.      HPI: Patient is a 81 y.o. female seen in the office today for routine follow up. Pt was hospitalized in march for COPD exacerbation and has done well since follow up visit.  PT/OT completed therapy. Nursing still coming out to the house but only has 4 more visits.  Does home Yoga 4 times a week with a yoga instructor.   Cont to take lasix 20 mg daily, brother reports increase swelling and worse in the last few days.  Weight at home has been stable. Brother is aware he is supposed to notify for weight gain of 3 lb in 1 day or 5 lb in 1 week.  Eating out a lot but she does not add salt to food. Does like things that are salty.  Eats a lot of soup.  Sits with legs dependant throughout the day.  No increase in shortness of breath or cough.  Sleeps flat.  Using albuterol inhaler about 1 time a day.   Using O2 at night PRN. Does not use Oxygen throughout the day. Son feels like they may need it more than she is using it.   Son concerned about OAB. Reports she gets up a lot a night. Reports incontinence at times. Uses pads in underwear.   Pt does not feel like this is an issue. Son and pt argue about this during visit.   stems:  Review of Systems  Constitutional: Negative for activity change, appetite change, chills and fever.  HENT: Negative for congestion, ear  pain, hearing loss, postnasal drip, rhinorrhea, sinus pain, sinus pressure and sore throat.   Respiratory: Negative for cough, shortness of breath and wheezing.   Cardiovascular: Positive for leg swelling. Negative for chest pain and palpitations.  Gastrointestinal: Negative for abdominal pain, constipation and diarrhea.  Musculoskeletal: Positive for arthralgias and myalgias.       Pain in shoulder and knee  Neurological: Negative for weakness and headaches.  Psychiatric/Behavioral: Positive for confusion. Negative for agitation, dysphoric mood and sleep disturbance. The patient is not nervous/anxious.     Past Medical History:  Diagnosis Date  . Carotid artery occlusion   . CKD (chronic kidney disease)   . COPD (chronic obstructive pulmonary disease) ([place])   . Diverticulosis   . Fall at home Sept. 2013  . Hyperlipidemia   . Hypertension   . Internal hemorrhoid   . Memory disorder 10/24/2014   Past Surgical History:  Procedure Laterality Date  . APPENDECTOMY    . BREAST REDUCTION SURGERY    . CAROTID ENDARTERECTOMY     left CEA  . CATARACT EXTRACTION Bilateral   . COLONOSCOPY  2015  . EYE SURGERY     cataracts removed, bilaterally   . skin cancer removal    . TONSILLECTOMY  Social History:   reports that she quit smoking about 22 years ago. Her smoking use included Cigarettes. She has a 5.00 pack-year smoking history. She has never used smokeless tobacco. She reports that she does not drink alcohol or use drugs.  Family History  Problem Relation Age of Onset  . Heart disease Father     Before age 3  . Hypertension Father   . Heart attack Father   . Cancer Mother 56    Brain  . Dementia Brother   . Cancer Maternal Aunt 61    breast cancer  . Diabetes Maternal Aunt   . Heart failure Maternal Grandmother   . Diabetes Maternal Grandmother   . Stroke Neg Hx     Medications: Patient's Medications  New Prescriptions   No medications on file  Previous  Medications   ALBUTEROL (PROVENTIL) (2.5 MG/3ML) 0.083% NEBULIZER SOLUTION    Take 6 mLs (5 mg total) by nebulization every 4 (four) hours as needed for wheezing or shortness of breath.   ARTIFICIAL TEAR OP    Place 2 drops into both eyes daily.    ASPIRIN EC 81 MG TABLET    Take 81 mg by mouth every evening.   ATORVASTATIN (LIPITOR) 20 MG TABLET    take 1 tablet by mouth once daily   B COMPLEX VITAMINS CAPSULE    Take 1 capsule by mouth 2 (two) times a week.    BYSTOLIC 5 MG TABLET    take 1 tablet by mouth once daily   CALCIUM CARBONATE-VITAMIN D (CALCIUM-D PO)    Take 1 tablet by mouth every evening.   CETIRIZINE (ZYRTEC) 10 MG TABLET    Take 10 mg by mouth daily.   DICLOFENAC SODIUM (VOLTAREN) 1 % GEL    Apply 1 application topically 2 (two) times daily as needed (shoulder and knee pain).   DONEPEZIL (ARICEPT) 10 MG TABLET    Take 10 mg by mouth daily.    FLUTICASONE (FLONASE) 50 MCG/ACT NASAL SPRAY    Place 2 sprays into both nostrils daily.   FLUTICASONE FUROATE-VILANTEROL (BREO ELLIPTA) 100-25 MCG/INH AEPB    Inhale 1 puff into the lungs daily.   FUROSEMIDE (LASIX) 40 MG TABLET    Take 0.5 tablets (20 mg total) by mouth daily.   IPRATROPIUM (ATROVENT) 0.06 % NASAL SPRAY    Place 2 sprays into both nostrils 2 (two) times daily.   IPRATROPIUM-ALBUTEROL (DUONEB) 0.5-2.5 (3) MG/3ML SOLN    Take 3 mLs by nebulization every 6 (six) hours.   MEMANTINE (NAMENDA) 10 MG TABLET    Take 1 tablet (10 mg total) by mouth 2 (two) times daily.   MULTIPLE VITAMINS-MINERALS (PRESERVISION AREDS 2) CAPS    Take 1 capsule by mouth 2 (two) times daily.    ONDANSETRON (ZOFRAN ODT) 4 MG DISINTEGRATING TABLET    Take 1 tablet (4 mg total) by mouth every morning.   PANTOPRAZOLE (PROTONIX) 40 MG TABLET    Take 1 tablet (40 mg total) by mouth daily.   RANITIDINE (ZANTAC) 150 MG TABLET    Take 150 mg by mouth daily.   VAYACOG 100-19.5-6.5 MG CAPS    take 1 capsule by mouth once daily  Modified Medications   No  medications on file  Discontinued Medications   No medications on file     Physical Exam:  Vitals:   03/30/17 1517  BP: 130/60  Pulse: 90  Resp: 18  Temp: 98.5 F (36.9 C)  SpO2: 94%  Weight:  195 lb (88.5 kg)  Height: 5\' 3"  (1.6 m)   Body mass index is 34.54 kg/m.  Physical Exam  Constitutional: She appears well-developed and well-nourished.  HENT:  Head: Normocephalic and atraumatic.  Nose: Nose normal.  Mouth/Throat: Oropharynx is clear and moist. No oropharyngeal exudate.  Eyes: Conjunctivae and EOM are normal. Pupils are equal, round, and reactive to light.  Neck: Normal range of motion. Neck supple.  Cardiovascular: Normal rate, regular rhythm and normal heart sounds.   Pulmonary/Chest: Effort normal. She has no wheezes.  Abdominal: Soft. Bowel sounds are normal. She exhibits no distension.  Musculoskeletal: She exhibits edema (1+ bilaterally).  Neurological: She is alert.  Skin: Skin is warm and dry.    Labs reviewed: Basic Metabolic Panel:  Recent Labs  02/01/17 1857 02/02/17 0511 02/03/17 0656 02/16/17 1457  NA  --  143 144 143  K  --  4.4 4.0 4.3  CL  --  113* 107 105  CO2  --  27 28 28   GLUCOSE  --  159* 92 99  BUN  --  21* 19 16  CREATININE  --  1.04* 1.06* 1.15*  CALCIUM  --  8.2* 8.8* 8.9  MG 2.1  --   --   --   PHOS 3.0  --   --   --    Liver Function Tests:  Recent Labs  02/01/17 1450 02/02/17 0511 02/16/17 1457  AST 24 21 23   ALT 16 16 15   ALKPHOS 60 50 59  BILITOT 1.1 0.7 0.8  PROT 5.9* 5.7* 5.8*  ALBUMIN 3.1* 2.9* 3.5*   No results for input(s): LIPASE, AMYLASE in the last 8760 hours. No results for input(s): AMMONIA in the last 8760 hours. CBC:  Recent Labs  02/01/17 1450 02/02/17 0511 02/03/17 0656 02/16/17 1457  WBC 9.2 5.7 11.8* 9.1  NEUTROABS 5.8  --   --  6,188  HGB 13.3 12.6 12.3 13.4  HCT 42.8 40.5 39.4 42.1  MCV 90.5 89.4 90.0 88.1  PLT 206 153 182 239   Lipid Panel: No results for input(s): CHOL,  HDL, LDLCALC, TRIG, CHOLHDL, LDLDIRECT in the last 8760 hours. TSH: No results for input(s): TSH in the last 8760 hours. A1C: Lab Results  Component Value Date   HGBA1C 5.6 04/12/2015     Assessment/Plan 1. Dementia with behavioral disturbance, unspecified dementia type Remains stable, no changes in behavior or increase in agitation, will cont current regimen.   2. Chronic diastolic congestive heart failure (HCC) Some increase edema in LE, most likely related to venous insufficiency Will increase lasix to 40 mg x 3 days then to resume previous dosing.  To elevate legs when you are sitting 2 gm sodium diet- stressed importance of compliance Use compression hose during the day- remove at bedtime  3. Essential hypertension -stable, cont current regimen  4. Gastroesophageal reflux disease, esophagitis presence not specified Stable on protonix and diet modifications.   5. COPD GOLD IV Doing well at this time. Remains off oxygen, able to walk in office and O2 remains at 9% or greater. Symptoms stable without increase in shortness of breath or wheezing.   6. OAB -pts son feels like this is more of an issue than pt, she does not report this is a complaint and it does not bother her.  encouraged Scheduled bathroom trips every 2 hours and when you leave the house and before events even if you feel like you do not need to go Make sure to stay  hydrated even if you feel like you need to use the bathroom frequently  Zakhia Seres K. Harle Battiest  [facility] [phone] 8 am - 5 pm) [phone] (after hours)

## 2017-03-30 NOTE — Progress Notes (Signed)
Quick Notes   Health Maintenance: Pn13 given today. TDAP due. Need prescription for TDAP and shingles     Abnormal Screen: MMSE 20/30. Did not pass clock drawing     Patient Concerns: Swelling in feet has been increasing since her last d/c from hospital. Lasix was cut in half and family is confused about that. Has been trying to get in contact with cardiologist but they havent returned call. Also was wondering if they could have portable home O2 unit.     Nurse Concerns: +1 nonpitting edema in bilateral feet.

## 2017-03-30 NOTE — Progress Notes (Signed)
Subjective:   Virginia [name] is a 81 y.o. female who presents for Medicare Annual (Subsequent) preventive examination.     Objective:     Vitals: There were no vitals taken for this visit.  There is no height or weight on file to calculate BMI.   Tobacco History  Smoking Status  . Former Smoker  . Years: 14.00  . Types: Cigarettes  . Quit date: 11/30/1994  Smokeless Tobacco  . Never Used    Comment: very light smoker, not even every day      Counseling given: Not Answered   Past Medical History:  Diagnosis Date  . Carotid artery occlusion   . CKD (chronic kidney disease)   . COPD (chronic obstructive pulmonary disease) ([place])   . Diverticulosis   . Fall at home Sept. 2013  . Hyperlipidemia   . Hypertension   . Internal hemorrhoid   . Memory disorder 10/24/2014   Past Surgical History:  Procedure Laterality Date  . APPENDECTOMY    . BREAST REDUCTION SURGERY    . CAROTID ENDARTERECTOMY     left CEA  . CATARACT EXTRACTION Bilateral   . COLONOSCOPY  2015  . EYE SURGERY     cataracts removed, bilaterally   . skin cancer removal    . TONSILLECTOMY     Family History  Problem Relation Age of Onset  . Heart disease Father     Before age 46  . Hypertension Father   . Heart attack Father   . Cancer Mother 63    Brain  . Dementia Brother   . Cancer Maternal Aunt 61    breast cancer  . Diabetes Maternal Aunt   . Heart failure Maternal Grandmother   . Diabetes Maternal Grandmother   . Stroke Neg Hx    History  Sexual Activity  . Sexual activity: No    Outpatient Encounter Prescriptions as of 03/30/2017  Medication Sig  . albuterol (PROVENTIL) (2.5 MG/3ML) 0.083% nebulizer solution Take 6 mLs (5 mg total) by nebulization every 4 (four) hours as needed for wheezing or shortness of breath.  . ARTIFICIAL TEAR OP Place 2 drops into both eyes daily.   Marland Kitchen aspirin EC 81 MG tablet Take 81 mg by mouth every evening.  Marland Kitchen atorvastatin (LIPITOR) 20 MG tablet take 1  tablet by mouth once daily  . b complex vitamins capsule Take 1 capsule by mouth 2 (two) times a week.   Marland Kitchen BYSTOLIC 5 MG tablet take 1 tablet by mouth once daily  . Calcium Carbonate-Vitamin D (CALCIUM-D PO) Take 1 tablet by mouth every evening.  . cetirizine (ZYRTEC) 10 MG tablet Take 10 mg by mouth daily.  . diclofenac sodium (VOLTAREN) 1 % GEL Apply 1 application topically 2 (two) times daily as needed (shoulder and knee pain).  Marland Kitchen donepezil (ARICEPT) 10 MG tablet Take 10 mg by mouth daily.   . fluticasone (FLONASE) 50 MCG/ACT nasal spray Place 2 sprays into both nostrils daily.  . fluticasone furoate-vilanterol (BREO ELLIPTA) 100-25 MCG/INH AEPB Inhale 1 puff into the lungs daily.  . furosemide (LASIX) 40 MG tablet Take 0.5 tablets (20 mg total) by mouth daily.  Marland Kitchen ipratropium (ATROVENT) 0.06 % nasal spray Place 2 sprays into both nostrils 2 (two) times daily.  Marland Kitchen ipratropium-albuterol (DUONEB) 0.5-2.5 (3) MG/3ML SOLN Take 3 mLs by nebulization every 6 (six) hours.  . memantine (NAMENDA) 10 MG tablet Take 1 tablet (10 mg total) by mouth 2 (two) times daily.  . Multiple  Vitamins-Minerals (PRESERVISION AREDS 2) CAPS Take 1 capsule by mouth 2 (two) times daily.   . ondansetron (ZOFRAN ODT) 4 MG disintegrating tablet Take 1 tablet (4 mg total) by mouth every morning.  . pantoprazole (PROTONIX) 40 MG tablet Take 1 tablet (40 mg total) by mouth daily.  . ranitidine (ZANTAC) 150 MG tablet Take 150 mg by mouth daily.  Marland Kitchen VAYACOG 100-19.5-6.5 MG CAPS take 1 capsule by mouth once daily   No facility-administered encounter medications on file as of 03/30/2017.     Activities of Daily Living In your present state of health, do you have any difficulty performing the following activities: 02/01/2017  Hearing? N  Vision? N  Difficulty concentrating or making decisions? Y  Walking or climbing stairs? Y  Dressing or bathing? N  Doing errands, shopping? Y  Some recent data might be hidden    Patient Care  Team: Lauree Chandler, NP as PCP - General (Nurse Practitioner) Fay Records, MD as Consulting Physician (Cardiology) Tanda Rockers, MD as Consulting Physician (Pulmonary Disease)    Assessment:     Exercise Activities and Dietary recommendations    Goals    None     Fall Risk Fall Risk  02/16/2017 12/23/2016 12/02/2016 11/20/2016 09/19/2016  Falls in the past year? No No No Yes Yes  Number falls in past yr: - - - 1 1  Injury with Fall? - - - No No   Depression Screen PHQ 2/9 Scores 05/23/2016 01/17/2016  PHQ - 2 Score 0 0     Cognitive Function MMSE - Mini Mental State Exam 11/12/2016 05/12/2016 05/11/2015 02/15/2015  Orientation to time 2 4 4 4   Orientation to Place 4 3 4 3   Registration 3 3 3 3   Attention/ Calculation 2 4 5 5   Recall 0 0 0 1  Language- name 2 objects 2 2 2 2   Language- repeat 1 1 1 1   Language- follow 3 step command 3 2 3 3   Language- read & follow direction 1 1 1 1   Write a sentence 1 1 1 1   Copy design 1 0 1 1  Total score 20 21 25 25         Immunization History  Administered Date(s) Administered  . Influenza Split 08/01/2014  . Influenza,inj,Quad PF,36+ Mos 08/16/2015, 08/15/2016  . Pneumococcal-Unspecified 08/01/2014   Screening Tests Health Maintenance  Topic Date Due  . TETANUS/TDAP  02/28/1955  . PNA vac Low Risk Adult (2 of 2 - PCV13) 08/02/2015  . INFLUENZA VACCINE  07/01/2017  . DEXA SCAN  Completed      Plan:    I have personally reviewed and addressed the Medicare Annual Wellness questionnaire and have noted the following in the patient's chart:  A. Medical and social history B. Use of alcohol, tobacco or illicit drugs  C. Current medications and supplements D. Functional ability and status E.  Nutritional status F.  Physical activity G. Advance directives H. List of other physicians I.  Hospitalizations, surgeries, and ER visits in previous 12 months J.  [place] to include hearing, vision, cognitive,  depression L. Referrals and appointments - none  In addition, I have reviewed and discussed with patient certain preventive protocols, quality metrics, and best practice recommendations. A written personalized care plan for preventive services as well as general preventive health recommendations were provided to patient.  See attached scanned questionnaire for additional information.   Signed,   Rich Reining, RN Nurse Health Advisor  I reviewed  health advisors note, was available for consultation and agree with documentation and plan.  Carlos American. Harle Battiest  [facility] [phone] 8 am - 5 pm) [phone] (after hours)

## 2017-03-31 ENCOUNTER — Telehealth: Payer: Self-pay | Admitting: *Deleted

## 2017-03-31 DIAGNOSIS — R2681 Unsteadiness on feet: Secondary | ICD-10-CM

## 2017-03-31 DIAGNOSIS — I5032 Chronic diastolic (congestive) heart failure: Secondary | ICD-10-CM | POA: Diagnosis not present

## 2017-03-31 DIAGNOSIS — N183 Chronic kidney disease, stage 3 (moderate): Secondary | ICD-10-CM | POA: Diagnosis not present

## 2017-03-31 DIAGNOSIS — Z9981 Dependence on supplemental oxygen: Secondary | ICD-10-CM | POA: Diagnosis not present

## 2017-03-31 DIAGNOSIS — Z9181 History of falling: Secondary | ICD-10-CM

## 2017-03-31 DIAGNOSIS — J441 Chronic obstructive pulmonary disease with (acute) exacerbation: Secondary | ICD-10-CM | POA: Diagnosis not present

## 2017-03-31 DIAGNOSIS — F0391 Unspecified dementia with behavioral disturbance: Secondary | ICD-10-CM | POA: Diagnosis not present

## 2017-03-31 DIAGNOSIS — I130 Hypertensive heart and chronic kidney disease with heart failure and stage 1 through stage 4 chronic kidney disease, or unspecified chronic kidney disease: Secondary | ICD-10-CM | POA: Diagnosis not present

## 2017-03-31 NOTE — Telephone Encounter (Signed)
Daughter, [name] stopped by and filled out Walk In Form stating that she is very concerned because patient's brother has removed very good, functioning toilets. There were handicap toilet seats with rails on them and now he has removed them and she has no rails. He placed them with plain handicap toilets. He has also changed the rug in front of her night time toilet to cover the mismatched floor and it has put her in [place] for a fall. (FYI)

## 2017-03-31 NOTE — Telephone Encounter (Signed)
Mailbox is full and cannot leave voicemail. Will try again later.

## 2017-03-31 NOTE — Telephone Encounter (Signed)
Yes I would recommend that she have toilet seats with rails if that's possible and remove all rugs, I think Home health is seeing her, we could have them do a safety assessment

## 2017-04-01 ENCOUNTER — Other Ambulatory Visit: Payer: Self-pay | Admitting: Internal Medicine

## 2017-04-01 NOTE — Telephone Encounter (Signed)
Yes please order for home safety assessment

## 2017-04-01 NOTE — Telephone Encounter (Signed)
Mailbox is full and cannot leave voicemail. Will try again later.

## 2017-04-01 NOTE — Telephone Encounter (Signed)
Spoke with daughter, [name], and she stated that [facility] no longer is coming to home. Can Home Health be ordered? Please Advise.

## 2017-04-02 NOTE — Telephone Encounter (Signed)
Refer to phone note on 03/24/17

## 2017-04-03 NOTE — Telephone Encounter (Signed)
Order placed

## 2017-04-14 ENCOUNTER — Encounter: Payer: Self-pay | Admitting: Physician Assistant

## 2017-04-14 NOTE — Progress Notes (Signed)
Cardiology Office Note    Date:  04/15/2017   ID:  Virginia [name], DOB 07-03-1936, MRN [id number]  PCP:  Lauree Chandler, NP  Cardiologist:  Dr. Harrington Challenger  Chief Complaint: Yearly follow up for CHF and HTN  History of Present Illness:   Virginia [name] is a 81 y.o. female HTN, HLD, COPD followed by Dr. Melvyn Novas, dementia, chronic diastolic CHF, carotid artery disease s/p L CEA presents for yearly follow up.   She was doing well on cardiac stand point when last seen by Dr. Harrington Challenger 05/2016.  Patient was admitted 01/2017 for acute respiratory failure with hypoxia. Now on as needed oxygen.   He had today for yearly follow-up with 3 family members. Everyone has lots of questions. All questions answered. She is somewhat compliant with dietary salt. She has intermittent lower extremity edema. Patient denies shortness of breath however family states that she has a shortness of breath intermittently. No orthopnea, PND, syncope, dizziness, melena or blood in his stool or urine.   Past Medical History:  Diagnosis Date  . Carotid artery occlusion   . CKD (chronic kidney disease)   . COPD (chronic obstructive pulmonary disease) ([place])   . Diverticulosis   . Fall at home Sept. 2013  . Hyperlipidemia   . Hypertension   . Internal hemorrhoid   . Memory disorder 10/24/2014    Past Surgical History:  Procedure Laterality Date  . APPENDECTOMY    . BREAST REDUCTION SURGERY    . CAROTID ENDARTERECTOMY     left CEA  . CATARACT EXTRACTION Bilateral   . COLONOSCOPY  2015  . EYE SURGERY     cataracts removed, bilaterally   . skin cancer removal    . TONSILLECTOMY      Current Medications: Prior to Admission medications   Medication Sig Start Date End Date Taking? Authorizing Provider  albuterol (PROVENTIL) (2.5 MG/3ML) 0.083% nebulizer solution Take 6 mLs (5 mg total) by nebulization every 4 (four) hours as needed for wheezing or shortness of breath. 02/03/17   Geradine Girt, DO  ARTIFICIAL TEAR OP  Place 2 drops into both eyes daily.     [provider]  aspirin EC 81 MG tablet Take 81 mg by mouth every evening.    [provider]  atorvastatin (LIPITOR) 20 MG tablet take 1 tablet by mouth once daily 05/27/16   Fay Records, MD  b complex vitamins capsule Take 1 capsule by mouth 2 (two) times a week.     [provider]  Calcium Carbonate-Vitamin D (CALCIUM-D PO) Take 1 tablet by mouth every evening.    [provider]  cetirizine (ZYRTEC) 10 MG tablet Take 10 mg by mouth daily.    [provider]  diclofenac sodium (VOLTAREN) 1 % GEL Apply 1 application topically 2 (two) times daily as needed (shoulder and knee pain).    [provider]  donepezil (ARICEPT) 10 MG tablet Take 10 mg by mouth daily.  12/29/16   [provider]  fluticasone (FLONASE) 50 MCG/ACT nasal spray Place 2 sprays into both nostrils daily.    [provider]  fluticasone furoate-vilanterol (BREO ELLIPTA) 100-25 MCG/INH AEPB Inhale 1 puff into the lungs daily. 02/19/16   Parrett, Fonnie Mu, NP  furosemide (LASIX) 40 MG tablet Take 0.5 tablets (20 mg total) by mouth daily. 02/03/17   Eulogio Bear U, DO  ipratropium (ATROVENT) 0.06 % nasal spray Place 2 sprays into both nostrils 2 (two) times daily.  03/26/17   Lauree Chandler, NP  ipratropium-albuterol (DUONEB) 0.5-2.5 (3) MG/3ML SOLN Take 3 mLs by nebulization every 6 (six) hours. 02/03/17   Geradine Girt, DO  memantine (NAMENDA) 10 MG tablet Take 1 tablet (10 mg total) by mouth 2 (two) times daily. 11/12/16   Kathrynn Ducking, MD  Multiple Vitamins-Minerals (PRESERVISION AREDS 2) CAPS Take 1 capsule by mouth 2 (two) times daily.     [provider]  nebivolol (BYSTOLIC) 5 MG tablet Take 1 tablet (5 mg total) by mouth daily. *Please call and schedule an appointment* 04/02/17   Fay Records, MD  ondansetron (ZOFRAN ODT) 4 MG disintegrating tablet Take 1 tablet (4 mg total) by mouth every morning.  11/04/16   Pyrtle, Lajuan Lines, MD  pantoprazole (PROTONIX) 40 MG tablet Take 1 tablet (40 mg total) by mouth daily. 02/16/17   Lauree Chandler, NP  ranitidine (ZANTAC) 150 MG tablet Take 150 mg by mouth daily. 01/26/17   [provider]  VAYACOG 100-19.5-6.5 MG CAPS take 1 capsule by mouth once daily 07/15/16   Kathrynn Ducking, MD    Allergies:   Lidocaine and Other   Social History   Social History  . Marital status: Divorced    Spouse name: N/A  . Number of children: 2  . Years of education: 7   Occupational History  . retired    Social History Main Topics  . Smoking status: Former Smoker    Packs/day: 0.25    Years: 20.00    Types: Cigarettes    Quit date: 11/30/1994  . Smokeless tobacco: Never Used     Comment: very light smoker, not even every day   . Alcohol use No  . Drug use: No  . Sexual activity: No   Other Topics Concern  . None   Social History Narrative   Diet:       Do you drink/eat things with caffeine: yes      Marital Status: Divorced   What Year Married:1959      Do you live in a house, apartment, Assisted Living, [place], trailer? House      Is it one or more stories? 2 stories      How many persons live in your home? Just Patient      Do you have any pets in your home? 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 Very Little   Type and how often? Walk      Do you have a living will? None   DNR?   Discuss one? No      Do you have signed POA/HPOA forms?      Patient drinks 1 cup of caffeine daily.   Patient is right handed.     Family History:  The patient's family history includes Cancer (age of onset: 69) in her mother; Cancer (age of onset: 62) in her maternal aunt; Dementia in her brother; Diabetes in her maternal aunt and maternal grandmother; Heart attack in her father; Heart disease in her father; Heart failure in her maternal grandmother; Hypertension in her father.   ROS:   Please see the  history of present illness.    ROS All other systems reviewed and are negative.   PHYSICAL EXAM:   VS:  BP 126/68   Pulse 74   Ht 5\' 3"  (1.6 m)   Wt 192 lb (87.1 kg)   SpO2 98%  BMI 34.01 kg/m    GEN: Well nourished, well developed, in no acute distress  HEENT: normal  Neck: no JVD, carotid bruits, or masses Cardiac: RRR; no murmurs, rubs, or gallops, 1+ BL LE  edema  Respiratory:  clear to auscultation bilaterally, normal work of breathing GI: soft, nontender, nondistended, + BS MS: no deformity or atrophy  Skin: warm and dry, no rash Neuro:  Alert and Oriented x 3, Strength and sensation are intact Psych: euthymic mood, full affect  Wt Readings from Last 3 Encounters:  04/15/17 192 lb (87.1 kg)  03/30/17 195 lb (88.5 kg)  03/30/17 195 lb (88.5 kg)      Studies/Labs Reviewed:   EKG:  EKG is not  ordered today.    Recent Labs: 02/01/2017: B Natriuretic Peptide 174.6; Magnesium 2.1 02/16/2017: ALT 15; BUN 16; Creat 1.15; Hemoglobin 13.4; Platelets 239; Potassium 4.3; Sodium 143   Lipid Panel    Component Value Date/Time   CHOL 167 08/01/2015 0857   TRIG 119.0 08/01/2015 0857   HDL 61.40 08/01/2015 0857   CHOLHDL 3 08/01/2015 0857   VLDL 23.8 08/01/2015 0857   LDLCALC 81 08/01/2015 0857   LDLDIRECT 142.0 05/24/2015 1108    Additional studies/ records that were reviewed today include:   Echocardiogram: 12/2014 Study Conclusions  - Left ventricle: The cavity size was normal. Systolic function was vigorous. The estimated ejection fraction was in the range of 65% to 70%. Wall motion was normal; there were no regional wall motion abnormalities. Doppler parameters are consistent with abnormal left ventricular relaxation (grade 1 diastolic dysfunction). - Mitral valve: Calcified annulus.  Carotid doppler 10/17 - Patent L CEA, mild bilateral internal carotid artery stenosis.     ASSESSMENT & PLAN:    1. Acute on chronic diastolic CHF - She is  volume overload on exam. Has lower extremity edema and dyspnea. Will increase Lasix to 40 mg daily x 5 days. Then cut back to Lasix 20 mg daily. Cut back on salt. Elevate leg. Try compression stocking. Check BNP and BMP. Her weight has been stable to 192-195lb.   2. HTN - Stable and well controlled on current regimen.  3. HLD - Followed by PCP. Continue statin.  4. CKD, stage III - Followed by Dr. Buelah Manis  5. carotid artery disease s/p L CEA  - Carotid doppler 10/17 showed patent L CEA, mild bilateral internal carotid artery stenosi    Medication Adjustments/Labs and Tests Ordered: Current medicines are reviewed at length with the patient today.  Concerns regarding medicines are outlined above.  Medication changes, Labs and Tests ordered today are listed in the Patient Instructions below. Patient Instructions  Medication Instructions:  Your physician has recommended you make the following change in your medication:  1.  INCREASE the Lasix to 20 mg taking 2 tablets by mouth for 5 days then go down to 1 tablet daily With the ok to take an extra tablet when needed for Edema / Swelling   Labwork: TODAY:  BMET & PRO BNP   Testing/Procedures: None ordered  Follow-Up: Your physician recommends that you schedule a follow-up appointment in: [place] DR. ROSS   Any Other Special Instructions Will Be Listed Below (If Applicable).  DASH Eating Plan DASH stands for "Dietary Approaches to Stop Hypertension." The DASH eating plan is a healthy eating plan that has been shown to reduce high blood pressure (hypertension). It may also reduce your risk for type 2 diabetes, heart disease, and stroke. The DASH eating plan  may also help with weight loss. What are tips for following this plan? General guidelines   Avoid eating more than 2,000 mg (milligrams) of salt (sodium) a day. If you have hypertension, you may need to reduce your sodium intake to 1,500 mg a day.  Limit alcohol intake to  no more than 1 drink a day for nonpregnant women and 2 drinks a day for men. One drink equals 12 oz of beer, 5 oz of wine, or 1 oz of hard liquor.  Work with your health care provider to maintain a healthy body weight or to lose weight. Ask what an ideal weight is for you.  Get at least 30 minutes of exercise that causes your heart to beat faster (aerobic exercise) most days of the week. Activities may include walking, swimming, or biking.  Work with your health care provider or diet and nutrition specialist (dietitian) to adjust your eating plan to your individual calorie needs. Reading food labels   Check food labels for the amount of sodium per serving. Choose foods with less than 5 percent of the Daily Value of sodium. Generally, foods with less than 300 mg of sodium per serving fit into this eating plan.  To find whole grains, look for the word "whole" as the first word in the ingredient list. Shopping   Buy products labeled as "low-sodium" or "no salt added."  Buy fresh foods. Avoid canned foods and premade or frozen meals. Cooking   Avoid adding salt when cooking. Use salt-free seasonings or herbs instead of table salt or sea salt. Check with your health care provider or pharmacist before using salt substitutes.  Do not fry foods. Cook foods using healthy methods such as baking, boiling, grilling, and broiling instead.  Cook with heart-healthy oils, such as olive, canola, soybean, or sunflower oil. Meal planning    Eat a balanced diet that includes:  5 or more servings of fruits and vegetables each day. At each meal, try to fill half of your plate with fruits and vegetables.  Up to 6-8 servings of whole grains each day.  Less than 6 oz of lean meat, poultry, or fish each day. A 3-oz serving of meat is about the same size as a deck of cards. One egg equals 1 oz.  2 servings of low-fat dairy each day.  A serving of nuts, seeds, or beans 5 times each week.  Heart-healthy  fats. Healthy fats called Omega-3 fatty acids are found in foods such as flaxseeds and coldwater fish, like sardines, salmon, and mackerel.  Limit how much you eat of the following:  Canned or prepackaged foods.  Food that is high in trans fat, such as fried foods.  Food that is high in saturated fat, such as fatty meat.  Sweets, desserts, sugary drinks, and other foods with added sugar.  Full-fat dairy products.  Do not salt foods before eating.  Try to eat at least 2 vegetarian meals each week.  Eat more home-cooked food and less restaurant, buffet, and fast food.  When eating at a restaurant, ask that your food be prepared with less salt or no salt, if possible. What foods are recommended? The items listed may not be a complete list. Talk with your dietitian about what dietary choices are best for you. Grains  Whole-grain or whole-wheat bread. Whole-grain or whole-wheat pasta. Brown rice. [place]row. Bulgur. Whole-grain and low-sodium cereals. Pita bread. Low-fat, low-sodium crackers. Whole-wheat flour tortillas. Vegetables  Fresh or frozen vegetables (raw, steamed, roasted,  or grilled). Low-sodium or reduced-sodium tomato and vegetable juice. Low-sodium or reduced-sodium tomato sauce and tomato paste. Low-sodium or reduced-sodium canned vegetables. Fruits  All fresh, dried, or frozen fruit. Canned fruit in natural juice (without added sugar). Meat and other protein foods  Skinless chicken or [place]. Ground chicken or [place]. Pork with fat trimmed off. Fish and seafood. Egg whites. Dried beans, peas, or lentils. Unsalted nuts, nut butters, and seeds. Unsalted canned beans. Lean cuts of beef with fat trimmed off. Low-sodium, lean deli meat. Dairy  Low-fat (1%) or fat-free (skim) milk. Fat-free, low-fat, or reduced-fat cheeses. Nonfat, low-sodium ricotta or cottage cheese. Low-fat or nonfat yogurt. Low-fat, low-sodium cheese. Fats and oils  Soft margarine without trans fats.  Vegetable oil. Low-fat, reduced-fat, or light mayonnaise and salad dressings (reduced-sodium). Canola, safflower, olive, soybean, and sunflower oils. Avocado. Seasoning and other foods  Herbs. Spices. Seasoning mixes without salt. Unsalted popcorn and pretzels. Fat-free sweets. What foods are not recommended? The items listed may not be a complete list. Talk with your dietitian about what dietary choices are best for you. Grains  Baked goods made with fat, such as croissants, muffins, or some breads. Dry pasta or rice meal packs. Vegetables  Creamed or fried vegetables. Vegetables in a cheese sauce. Regular canned vegetables (not low-sodium or reduced-sodium). Regular canned tomato sauce and paste (not low-sodium or reduced-sodium). Regular tomato and vegetable juice (not low-sodium or reduced-sodium). Angie Fava. Olives. Fruits  Canned fruit in a light or heavy syrup. Fried fruit. Fruit in cream or butter sauce. Meat and other protein foods  Fatty cuts of meat. Ribs. Fried meat. Berniece Salines. Sausage. Bologna and other processed lunch meats. Salami. Fatback. Hotdogs. Bratwurst. Salted nuts and seeds. Canned beans with added salt. Canned or smoked fish. Whole eggs or egg yolks. Chicken or [place] with skin. Dairy  Whole or 2% milk, cream, and half-and-half. Whole or full-fat cream cheese. Whole-fat or sweetened yogurt. Full-fat cheese. Nondairy creamers. Whipped toppings. Processed cheese and cheese spreads. Fats and oils  Butter. Stick margarine. Lard. Shortening. Ghee. Bacon fat. Tropical oils, such as coconut, palm kernel, or palm oil. Seasoning and other foods  Salted popcorn and pretzels. Onion salt, garlic salt, seasoned salt, table salt, and sea salt. Worcestershire sauce. Tartar sauce. Barbecue sauce. Teriyaki sauce. Soy sauce, including reduced-sodium. Steak sauce. Canned and packaged gravies. Fish sauce. Oyster sauce. Cocktail sauce. Horseradish that you find on the shelf. Ketchup. Mustard. Meat  flavorings and tenderizers. Bouillon cubes. Hot sauce and Tabasco sauce. Premade or packaged marinades. Premade or packaged taco seasonings. Relishes. Regular salad dressings. Where to find more information:  National Heart, Lung, and [place]: https://wilson-eaton.com/  American Heart Association: www.heart.org Summary  The DASH eating plan is a healthy eating plan that has been shown to reduce high blood pressure (hypertension). It may also reduce your risk for type 2 diabetes, heart disease, and stroke.  With the DASH eating plan, you should limit salt (sodium) intake to 2,300 mg a day. If you have hypertension, you may need to reduce your sodium intake to 1,500 mg a day.  When on the DASH eating plan, aim to eat more fresh fruits and vegetables, whole grains, lean proteins, low-fat dairy, and heart-healthy fats.  Work with your health care provider or diet and nutrition specialist (dietitian) to adjust your eating plan to your individual calorie needs. This information is not intended to replace advice given to you by your health care provider. Make sure you discuss any questions you have with your  health care provider. Document Released: 11/06/2011 Document Revised: 11/10/2016 Document Reviewed: 11/10/2016 Elsevier Interactive Patient Education  2017 [employer].     If you need a refill on your cardiac medications before your next appointment, please call your pharmacy.      Jarrett Soho, [place]  04/15/2017 3:14 PM    [facility] [address], [place], [place]  [address] Phone: [phone]; Fax: [phone]

## 2017-04-15 ENCOUNTER — Encounter (INDEPENDENT_AMBULATORY_CARE_PROVIDER_SITE_OTHER): Payer: Self-pay

## 2017-04-15 ENCOUNTER — Encounter: Payer: Self-pay | Admitting: Physician Assistant

## 2017-04-15 ENCOUNTER — Ambulatory Visit (INDEPENDENT_AMBULATORY_CARE_PROVIDER_SITE_OTHER): Payer: Medicare Other | Admitting: Physician Assistant

## 2017-04-15 VITALS — BP 126/68 | HR 74 | Ht 63.0 in | Wt 192.0 lb

## 2017-04-15 DIAGNOSIS — E784 Other hyperlipidemia: Secondary | ICD-10-CM | POA: Diagnosis not present

## 2017-04-15 DIAGNOSIS — E7849 Other hyperlipidemia: Secondary | ICD-10-CM

## 2017-04-15 DIAGNOSIS — I6523 Occlusion and stenosis of bilateral carotid arteries: Secondary | ICD-10-CM

## 2017-04-15 DIAGNOSIS — R600 Localized edema: Secondary | ICD-10-CM | POA: Diagnosis not present

## 2017-04-15 DIAGNOSIS — I5033 Acute on chronic diastolic (congestive) heart failure: Secondary | ICD-10-CM | POA: Diagnosis not present

## 2017-04-15 DIAGNOSIS — I10 Essential (primary) hypertension: Secondary | ICD-10-CM | POA: Diagnosis not present

## 2017-04-15 DIAGNOSIS — Z79899 Other long term (current) drug therapy: Secondary | ICD-10-CM | POA: Diagnosis not present

## 2017-04-15 MED ORDER — FUROSEMIDE 20 MG PO TABS: ORAL_TABLET | ORAL | 1 refills | Status: DC

## 2017-04-15 NOTE — Patient Instructions (Addendum)
Medication Instructions:  Your physician has recommended you make the following change in your medication:  1.  INCREASE the Lasix to 20 mg taking 2 tablets by mouth for 5 days then go down to 1 tablet daily With the ok to take an extra tablet when needed for Edema / Swelling   Labwork: TODAY:  BMET & PRO BNP   Testing/Procedures: None ordered  Follow-Up: Your physician recommends that you schedule a follow-up appointment in: [place] DR. ROSS   Any Other Special Instructions Will Be Listed Below (If Applicable).  DASH Eating Plan DASH stands for "Dietary Approaches to Stop Hypertension." The DASH eating plan is a healthy eating plan that has been shown to reduce high blood pressure (hypertension). It may also reduce your risk for type 2 diabetes, heart disease, and stroke. The DASH eating plan may also help with weight loss. What are tips for following this plan? General guidelines   Avoid eating more than 2,000 mg (milligrams) of salt (sodium) a day. If you have hypertension, you may need to reduce your sodium intake to 1,500 mg a day.  Limit alcohol intake to no more than 1 drink a day for nonpregnant women and 2 drinks a day for men. One drink equals 12 oz of beer, 5 oz of wine, or 1 oz of hard liquor.  Work with your health care provider to maintain a healthy body weight or to lose weight. Ask what an ideal weight is for you.  Get at least 30 minutes of exercise that causes your heart to beat faster (aerobic exercise) most days of the week. Activities may include walking, swimming, or biking.  Work with your health care provider or diet and nutrition specialist (dietitian) to adjust your eating plan to your individual calorie needs. Reading food labels   Check food labels for the amount of sodium per serving. Choose foods with less than 5 percent of the Daily Value of sodium. Generally, foods with less than 300 mg of sodium per serving fit into this eating plan.  To  find whole grains, look for the word "whole" as the first word in the ingredient list. Shopping   Buy products labeled as "low-sodium" or "no salt added."  Buy fresh foods. Avoid canned foods and premade or frozen meals. Cooking   Avoid adding salt when cooking. Use salt-free seasonings or herbs instead of table salt or sea salt. Check with your health care provider or pharmacist before using salt substitutes.  Do not fry foods. Cook foods using healthy methods such as baking, boiling, grilling, and broiling instead.  Cook with heart-healthy oils, such as olive, canola, soybean, or sunflower oil. Meal planning    Eat a balanced diet that includes:  5 or more servings of fruits and vegetables each day. At each meal, try to fill half of your plate with fruits and vegetables.  Up to 6-8 servings of whole grains each day.  Less than 6 oz of lean meat, poultry, or fish each day. A 3-oz serving of meat is about the same size as a deck of cards. One egg equals 1 oz.  2 servings of low-fat dairy each day.  A serving of nuts, seeds, or beans 5 times each week.  Heart-healthy fats. Healthy fats called Omega-3 fatty acids are found in foods such as flaxseeds and coldwater fish, like sardines, salmon, and mackerel.  Limit how much you eat of the following:  Canned or prepackaged foods.  Food that is high in  trans fat, such as fried foods.  Food that is high in saturated fat, such as fatty meat.  Sweets, desserts, sugary drinks, and other foods with added sugar.  Full-fat dairy products.  Do not salt foods before eating.  Try to eat at least 2 vegetarian meals each week.  Eat more home-cooked food and less restaurant, buffet, and fast food.  When eating at a restaurant, ask that your food be prepared with less salt or no salt, if possible. What foods are recommended? The items listed may not be a complete list. Talk with your dietitian about what dietary choices are best for  you. Grains  Whole-grain or whole-wheat bread. Whole-grain or whole-wheat pasta. Brown rice. Modena Morrow. Bulgur. Whole-grain and low-sodium cereals. Pita bread. Low-fat, low-sodium crackers. Whole-wheat flour tortillas. Vegetables  Fresh or frozen vegetables (raw, steamed, roasted, or grilled). Low-sodium or reduced-sodium tomato and vegetable juice. Low-sodium or reduced-sodium tomato sauce and tomato paste. Low-sodium or reduced-sodium canned vegetables. Fruits  All fresh, dried, or frozen fruit. Canned fruit in natural juice (without added sugar). Meat and other protein foods  Skinless chicken or [place]. Ground chicken or [place]. Pork with fat trimmed off. Fish and seafood. Egg whites. Dried beans, peas, or lentils. Unsalted nuts, nut butters, and seeds. Unsalted canned beans. Lean cuts of beef with fat trimmed off. Low-sodium, lean deli meat. Dairy  Low-fat (1%) or fat-free (skim) milk. Fat-free, low-fat, or reduced-fat cheeses. Nonfat, low-sodium ricotta or cottage cheese. Low-fat or nonfat yogurt. Low-fat, low-sodium cheese. Fats and oils  Soft margarine without trans fats. Vegetable oil. Low-fat, reduced-fat, or light mayonnaise and salad dressings (reduced-sodium). Canola, safflower, olive, soybean, and sunflower oils. Avocado. Seasoning and other foods  Herbs. Spices. Seasoning mixes without salt. Unsalted popcorn and pretzels. Fat-free sweets. What foods are not recommended? The items listed may not be a complete list. Talk with your dietitian about what dietary choices are best for you. Grains  Baked goods made with fat, such as croissants, muffins, or some breads. Dry pasta or rice meal packs. Vegetables  Creamed or fried vegetables. Vegetables in a cheese sauce. Regular canned vegetables (not low-sodium or reduced-sodium). Regular canned tomato sauce and paste (not low-sodium or reduced-sodium). Regular tomato and vegetable juice (not low-sodium or reduced-sodium). Angie Fava.  Olives. Fruits  Canned fruit in a light or heavy syrup. Fried fruit. Fruit in cream or butter sauce. Meat and other protein foods  Fatty cuts of meat. Ribs. Fried meat. Berniece Salines. Sausage. Bologna and other processed lunch meats. Salami. Fatback. Hotdogs. Bratwurst. Salted nuts and seeds. Canned beans with added salt. Canned or smoked fish. Whole eggs or egg yolks. Chicken or [place] with skin. Dairy  Whole or 2% milk, cream, and half-and-half. Whole or full-fat cream cheese. Whole-fat or sweetened yogurt. Full-fat cheese. Nondairy creamers. Whipped toppings. Processed cheese and cheese spreads. Fats and oils  Butter. Stick margarine. Lard. Shortening. Ghee. Bacon fat. Tropical oils, such as coconut, palm kernel, or palm oil. Seasoning and other foods  Salted popcorn and pretzels. Onion salt, garlic salt, seasoned salt, table salt, and sea salt. Worcestershire sauce. Tartar sauce. Barbecue sauce. Teriyaki sauce. Soy sauce, including reduced-sodium. Steak sauce. Canned and packaged gravies. Fish sauce. Oyster sauce. Cocktail sauce. Horseradish that you find on the shelf. Ketchup. Mustard. Meat flavorings and tenderizers. Bouillon cubes. Hot sauce and Tabasco sauce. Premade or packaged marinades. Premade or packaged taco seasonings. Relishes. Regular salad dressings. Where to find more information:  National Heart, Lung, and Blood Institute: https://wilson-eaton.com/  American Heart  Association: www.heart.org Summary  The DASH eating plan is a healthy eating plan that has been shown to reduce high blood pressure (hypertension). It may also reduce your risk for type 2 diabetes, heart disease, and stroke.  With the DASH eating plan, you should limit salt (sodium) intake to 2,300 mg a day. If you have hypertension, you may need to reduce your sodium intake to 1,500 mg a day.  When on the DASH eating plan, aim to eat more fresh fruits and vegetables, whole grains, lean proteins, low-fat dairy, and heart-healthy  fats.  Work with your health care provider or diet and nutrition specialist (dietitian) to adjust your eating plan to your individual calorie needs. This information is not intended to replace advice given to you by your health care provider. Make sure you discuss any questions you have with your health care provider. Document Released: 11/06/2011 Document Revised: 11/10/2016 Document Reviewed: 11/10/2016 Elsevier Interactive Patient Education  2017 [employer].     If you need a refill on your cardiac medications before your next appointment, please call your pharmacy.

## 2017-04-16 ENCOUNTER — Telehealth: Payer: Self-pay

## 2017-04-16 LAB — BASIC METABOLIC PANEL
BUN / CREAT RATIO: 18 (ref 12–28)
BUN: 22 mg/dL (ref 8–27)
CALCIUM: 9.3 mg/dL (ref 8.7–10.3)
CHLORIDE: 97 mmol/L (ref 96–106)
CO2: 29 mmol/L (ref 18–29)
Creatinine, Ser: 1.24 mg/dL — ABNORMAL HIGH (ref 0.57–1.00)
GFR, EST AFRICAN AMERICAN: 48 mL/min/{1.73_m2} — AB (ref >59)
GFR, EST NON AFRICAN AMERICAN: 41 mL/min/{1.73_m2} — AB (ref >59)
Glucose: 98 mg/dL (ref 65–99)
POTASSIUM: 5 mmol/L (ref 3.5–5.2)
SODIUM: 142 mmol/L (ref 134–144)

## 2017-04-16 LAB — PRO B NATRIURETIC PEPTIDE: NT-Pro BNP: 431 pg/mL (ref 0–738)

## 2017-04-16 NOTE — Telephone Encounter (Signed)
Message left on clinical intake voicemail:   [name] (patient's daughter) left message requesting copy of lab results and has come concerns about handicap accessible toilet   I returned called and informed [name], copy of labs placed at the front desk for pick-up as requested.[name] then proceeded to tell me about a situation where her brother has moved in with her mother as of July 2017 and is taking over the house.  Patient's son has removed the handicap accessible toilet and replaced it with a different/regular toilet that puts patient at an increased risk for falling, the only thing patient has to hold onto is the toilet tissue roll.   Patient has no one to help her bathe, the brother is not concerned about her bathing or brushing teeth.   [name] asked if her mother can refuse to bathe, she questions if she should be made to bathe.   [name] questions what all Home Health will do (see scanned document under media dated 04/05/17)   [name] lives in [place] yet her and her brother have co-power of attorney.   [name] is looking for advice, recommendations, or any input on how to improve her mothers living situation and safety in the home.

## 2017-04-17 ENCOUNTER — Telehealth: Payer: Self-pay | Admitting: Physician Assistant

## 2017-04-17 DIAGNOSIS — I509 Heart failure, unspecified: Secondary | ICD-10-CM

## 2017-04-17 DIAGNOSIS — Z79899 Other long term (current) drug therapy: Secondary | ICD-10-CM

## 2017-04-17 NOTE — Telephone Encounter (Signed)
Returned call to brother (ok per DPR)-aware of results and recommendations:  Notes recorded by Leanor Kail, PA on 04/16/2017 at 8:13 AM EDT Fluid level is normal. Will increase lasix to 40mg  only for 3 days instead of 5 days. Scr of 1.24 (baseline 1.1). Recheck BMET in 1 week. She need to follow up with PCP for other general questions   Lab order and appt placed.  Brother aware and verbalized understanding.

## 2017-04-17 NOTE — Telephone Encounter (Signed)
Okay great. Otherwise there is not much we can do, they are at odds and this seems like a family issue. Family counseling sounds like it could be beneficial

## 2017-04-17 NOTE — Telephone Encounter (Signed)
New message   Virginia [name] is returning call about pt lab results.

## 2017-04-17 NOTE — Telephone Encounter (Signed)
Patient daughter, Guadalupe Dawn into office to pick up labs and requested to speak with Clinical Intake. Daughter wanted to discuss blood work, explained values to daughter. Daughter also stated that her brother has moved in with her mother and has placed her at risk of falling by changing out all the toilets in the home. Patient does have [facility] that comes in and I suggested her to speak with the Social worker at the [facility] agency regarding patient's risk. She agreed and stated that she will call [facility] that comes out to her home.

## 2017-04-22 ENCOUNTER — Telehealth: Payer: Self-pay | Admitting: Family

## 2017-04-22 ENCOUNTER — Other Ambulatory Visit: Payer: Self-pay

## 2017-04-22 DIAGNOSIS — M1711 Unilateral primary osteoarthritis, right knee: Secondary | ICD-10-CM | POA: Diagnosis not present

## 2017-04-22 DIAGNOSIS — I6529 Occlusion and stenosis of unspecified carotid artery: Secondary | ICD-10-CM

## 2017-04-22 DIAGNOSIS — M19011 Primary osteoarthritis, right shoulder: Secondary | ICD-10-CM | POA: Diagnosis not present

## 2017-04-22 NOTE — Telephone Encounter (Signed)
-----   Message from Viann Fish, NP sent at 04/21/2017  5:38 PM EDT ----- Regarding: RE: Appointment Question [place], I reviewed the result of her last carotid duplex, no change from the previous duplex which is good. She does not need to return in July. She can return in December 2018 with carotid duplex and see me afterward. Thanks, Vinnie Level  ----- Message ----- From: Lujean Amel Sent: 04/21/2017   2:28 PM To: Sharmon Leyden Nickel, NP Subject: Appointment Question                           Vinnie Level, I have a question for you. After scheduling this appointment with the pt's son just now, I realized the patient did actually have her carotid US on 11/25/16 but cancelled the followup appointment with you on 12/05/16 due to being sick. She has been in the hospital for several weeks and now her son is attempting to get all of her missed appointments rescheduled. Will she need another carotid ultrasound? Or just an appointment to see you? I scheduled her for both on 06/11/17 but wanted to be sure if she actually will need another ultrasound? Please advise. Thanks, Anne Ng

## 2017-04-22 NOTE — Telephone Encounter (Signed)
Per the note from [place]  I called the patient's son and LVM for him regarding the appt change from 05/2017 to 11/27/17. awt

## 2017-04-23 ENCOUNTER — Other Ambulatory Visit: Payer: Medicare Other | Admitting: *Deleted

## 2017-04-23 DIAGNOSIS — Z961 Presence of intraocular lens: Secondary | ICD-10-CM | POA: Diagnosis not present

## 2017-04-23 DIAGNOSIS — I5032 Chronic diastolic (congestive) heart failure: Secondary | ICD-10-CM | POA: Diagnosis not present

## 2017-04-23 DIAGNOSIS — H524 Presbyopia: Secondary | ICD-10-CM | POA: Diagnosis not present

## 2017-04-23 DIAGNOSIS — H5203 Hypermetropia, bilateral: Secondary | ICD-10-CM | POA: Diagnosis not present

## 2017-04-23 DIAGNOSIS — I10 Essential (primary) hypertension: Secondary | ICD-10-CM

## 2017-04-23 DIAGNOSIS — H52223 Regular astigmatism, bilateral: Secondary | ICD-10-CM | POA: Diagnosis not present

## 2017-04-23 DIAGNOSIS — H04123 Dry eye syndrome of bilateral lacrimal glands: Secondary | ICD-10-CM | POA: Diagnosis not present

## 2017-04-23 NOTE — Progress Notes (Signed)
[id number]

## 2017-04-24 LAB — BASIC METABOLIC PANEL
BUN/Creatinine Ratio: 19 (ref 12–28)
BUN: 23 mg/dL (ref 8–27)
CO2: 27 mmol/L (ref 18–29)
Calcium: 9.1 mg/dL (ref 8.7–10.3)
Chloride: 103 mmol/L (ref 96–106)
Creatinine, Ser: 1.22 mg/dL — ABNORMAL HIGH (ref 0.57–1.00)
GFR, EST AFRICAN AMERICAN: 49 mL/min/{1.73_m2} — AB (ref >59)
GFR, EST NON AFRICAN AMERICAN: 42 mL/min/{1.73_m2} — AB (ref >59)
Glucose: 96 mg/dL (ref 65–99)
Potassium: 5 mmol/L (ref 3.5–5.2)
SODIUM: 144 mmol/L (ref 134–144)

## 2017-05-07 DIAGNOSIS — M19011 Primary osteoarthritis, right shoulder: Secondary | ICD-10-CM | POA: Diagnosis not present

## 2017-05-11 DIAGNOSIS — M1711 Unilateral primary osteoarthritis, right knee: Secondary | ICD-10-CM | POA: Diagnosis not present

## 2017-05-13 ENCOUNTER — Encounter: Payer: Self-pay | Admitting: Adult Health

## 2017-05-13 ENCOUNTER — Ambulatory Visit (INDEPENDENT_AMBULATORY_CARE_PROVIDER_SITE_OTHER): Payer: Medicare Other | Admitting: Adult Health

## 2017-05-13 VITALS — BP 119/65 | HR 72 | Wt 196.0 lb

## 2017-05-13 DIAGNOSIS — R413 Other amnesia: Secondary | ICD-10-CM | POA: Diagnosis not present

## 2017-05-13 DIAGNOSIS — I6523 Occlusion and stenosis of bilateral carotid arteries: Secondary | ICD-10-CM

## 2017-05-13 DIAGNOSIS — M19011 Primary osteoarthritis, right shoulder: Secondary | ICD-10-CM | POA: Diagnosis not present

## 2017-05-13 NOTE — Progress Notes (Signed)
I have read the note, and I agree with the clinical assessment and plan.  Virginia [name],Virginia [name]   

## 2017-05-13 NOTE — Progress Notes (Signed)
PATIENT: Virginia [name] DOB: [birth year]  REASON FOR VISIT: follow up- memory  HISTORY FROM: patient  HISTORY OF PRESENT ILLNESS: Virginia [name]  Is a 81 year old female with a history of progressive memory disturbance. She returns today for follow-up. The patient is currently on Aricept and Namenda and tolerating the medications well. She is currently living with her brother. Her brother helps her with her finances. He also does all the cooking. He manages her medication and use of with her appointments. The patient does not operate a motor vehicle. She denies any trouble sleeping. Denies any hallucinations. No change in mood or behavior. She returns today for an evaluation.  HISTORY 11/12/16: Virginia [name] is a 81 year old right-handed white female with a history of a progressive memory disturbance. The patient currently lives with her brother. The brother does the driving, the patient does not operate a motor vehicle. The brother will handle the cooking, he keeps track of the medications and appointments, and he does the finances. The patient has had some slight decline in her memory since last seen, she is on Aricept and Namenda. She sleeps well at night, but she has been followed through gastroenterology for intermittent episodes of nausea in the morning. This has been going on for least 6 or 7 months. The patient fortunately is not losing weight. The patient returns to this office for an evaluation.   REVIEW OF SYSTEMS: Out of a complete 14 system review of symptoms, the patient complains only of the following symptoms, and all other reviewed systems are negative.  Fatigue, eye itching, shortness of breath, runny nose, leg swelling, frequent waking, daytime sleepiness, sleep talking, acting out dreams, nausea, joint pain, joint swelling, aching muscles, confusion, memory loss, environmental allergies, cold intolerance    ALLERGIES: Allergies  Allergen Reactions  . Lidocaine Anaphylaxis,  Swelling, Rash and Other (See Comments)    Any of the " [facility] "  . Other Other (See Comments)    All drugs that end in "cane'-  novacane etc. Daughter states patient almost died when she had it when she was born    HOME MEDICATIONS: Outpatient Medications Prior to Visit  Medication Sig Dispense Refill  . albuterol (PROVENTIL) (2.5 MG/3ML) 0.083% nebulizer solution Take 6 mLs (5 mg total) by nebulization every 4 (four) hours as needed for wheezing or shortness of breath. 75 mL 12  . ARTIFICIAL TEAR OP Place 2 drops into both eyes daily.     Marland Kitchen aspirin EC 81 MG tablet Take 81 mg by mouth every evening.    Marland Kitchen atorvastatin (LIPITOR) 20 MG tablet take 1 tablet by mouth once daily 90 tablet 3  . b complex vitamins capsule Take 1 capsule by mouth 2 (two) times a week.     . Calcium Carbonate-Vitamin D (CALCIUM-D PO) Take 1 tablet by mouth every evening.    . cetirizine (ZYRTEC) 10 MG tablet Take 10 mg by mouth daily.    . diclofenac sodium (VOLTAREN) 1 % GEL Apply 1 application topically 2 (two) times daily as needed (shoulder and knee pain).    Marland Kitchen donepezil (ARICEPT) 10 MG tablet Take 10 mg by mouth daily.   0  . fluticasone (FLONASE) 50 MCG/ACT nasal spray Place 2 sprays into both nostrils daily.    . fluticasone furoate-vilanterol (BREO ELLIPTA) 100-25 MCG/INH AEPB Inhale 1 puff into the lungs daily. 30 each 5  . furosemide (LASIX) 20 MG tablet Take 2 tablets by mouth in the a.m for 5  days then go to 1 tablet by mouth daily with the ok to take 1 extra tablet DAILY AS NEEDED FOR EDEMA/SWELLING 45 tablet 1  . ipratropium (ATROVENT) 0.06 % nasal spray Place 2 sprays into both nostrils 2 (two) times daily. 15 mL 1  . ipratropium-albuterol (DUONEB) 0.5-2.5 (3) MG/3ML SOLN Take 3 mLs by nebulization every 6 (six) hours. 360 mL 0  . memantine (NAMENDA) 10 MG tablet Take 1 tablet (10 mg total) by mouth 2 (two) times daily. 180 tablet 3  . Multiple Vitamins-Minerals (PRESERVISION AREDS 2) CAPS Take 1  capsule by mouth 2 (two) times daily.     . nebivolol (BYSTOLIC) 5 MG tablet Take 1 tablet (5 mg total) by mouth daily. *Please call and schedule an appointment* 90 tablet 0  . ondansetron (ZOFRAN ODT) 4 MG disintegrating tablet Take 1 tablet (4 mg total) by mouth every morning. 90 tablet 0  . pantoprazole (PROTONIX) 40 MG tablet Take 1 tablet (40 mg total) by mouth daily. 90 tablet 2  . ranitidine (ZANTAC) 150 MG tablet Take 150 mg by mouth daily.  0  . VAYACOG 100-19.5-6.5 MG CAPS take 1 capsule by mouth once daily 30 capsule 11   No facility-administered medications prior to visit.     PAST MEDICAL HISTORY: Past Medical History:  Diagnosis Date  . Carotid artery occlusion   . CKD (chronic kidney disease)   . COPD (chronic obstructive pulmonary disease) ([place])   . Diverticulosis   . Fall at home Sept. 2013  . Hyperlipidemia   . Hypertension   . Internal hemorrhoid   . Memory disorder 10/24/2014    PAST SURGICAL HISTORY: Past Surgical History:  Procedure Laterality Date  . APPENDECTOMY    . BREAST REDUCTION SURGERY    . CAROTID ENDARTERECTOMY     left CEA  . CATARACT EXTRACTION Bilateral   . COLONOSCOPY  2015  . EYE SURGERY     cataracts removed, bilaterally   . skin cancer removal    . TONSILLECTOMY      FAMILY HISTORY: Family History  Problem Relation Age of Onset  . Heart disease Father        Before age 46  . Hypertension Father   . Heart attack Father   . Cancer Mother 57       Brain  . Dementia Brother   . Cancer Maternal Aunt 61       breast cancer  . Diabetes Maternal Aunt   . Heart failure Maternal Grandmother   . Diabetes Maternal Grandmother   . Stroke Neg Hx     SOCIAL HISTORY: Social History   Social History  . Marital status: Divorced    Spouse name: N/A  . Number of children: 2  . Years of education: 7   Occupational History  . retired    Social History Main Topics  . Smoking status: Former Smoker    Packs/day: 0.25    Years:  20.00    Types: Cigarettes    Quit date: 11/30/1994  . Smokeless tobacco: Never Used     Comment: very light smoker, not even every day   . Alcohol use No  . Drug use: No  . Sexual activity: No   Other Topics Concern  . Not on file   Social History Narrative   Diet:       Do you drink/eat things with caffeine: yes      Marital Status: Divorced   What Year Married:1959  Do you live in a house, apartment, Assisted Living, Condo, trailer? House      Is it one or more stories? 2 stories      How many persons live in your home? Just Patient      Do you have any pets in your home? 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 Very Little   Type and how often? Walk      Do you have a living will? None   DNR?   Discuss one? No      Do you have signed POA/HPOA forms?      Patient drinks 1 cup of caffeine daily.   Patient is right handed.      PHYSICAL EXAM  Vitals:   05/13/17 1539  BP: 119/65  Pulse: 72  Weight: 196 lb (88.9 kg)   Body mass index is 34.72 kg/m. MMSE - Mini Mental State Exam 05/13/2017 03/30/2017 11/12/2016  Orientation to time 0 3 2  Orientation to Place 4 3 4   Registration 3 3 3   Attention/ Calculation 5 4 2   Recall 0 0 0  Language- name 2 objects 2 2 2   Language- repeat 1 1 1   Language- follow 3 step command 3 1 3   Language- read & follow direction 1 1 1   Write a sentence 1 1 1   Copy design 1 1 1   Total score 21 20 20     Generalized: Well developed, in no acute distress   Neurological examination  Mentation: Alert. Follows all commands speech and language fluent Cranial nerve II-XII: Pupils were equal round reactive to light. Extraocular movements were full, visual field were full on confrontational test. Facial sensation and strength were normal. Uvula tongue midline. Head turning and shoulder shrug  were normal and symmetric. Motor: The motor testing reveals 5 over 5 strength of all 4 extremities. Good  symmetric motor tone is noted throughout.  Sensory: Sensory testing is intact to soft touch on all 4 extremities. No evidence of extinction is noted.  Coordination: Cerebellar testing reveals good finger-nose-finger and heel-to-shin bilaterally.  Gait and station: Gait is normal.   Reflexes: Deep tendon reflexes are symmetric and normal bilaterally.   DIAGNOSTIC DATA (LABS, IMAGING, TESTING) - I reviewed patient records, labs, notes, testing and imaging myself where available.  Lab Results  Component Value Date   WBC 9.1 02/16/2017   HGB 13.4 02/16/2017   HCT 42.1 02/16/2017   MCV 88.1 02/16/2017   PLT 239 02/16/2017      Component Value Date/Time   NA 144 04/23/2017 1534   K 5.0 04/23/2017 1534   CL 103 04/23/2017 1534   CO2 27 04/23/2017 1534   GLUCOSE 96 04/23/2017 1534   GLUCOSE 99 02/16/2017 1457   BUN 23 04/23/2017 1534   CREATININE 1.22 (H) 04/23/2017 1534   CREATININE 1.15 (H) 02/16/2017 1457   CALCIUM 9.1 04/23/2017 1534   PROT 5.8 (L) 02/16/2017 1457   ALBUMIN 3.5 (L) 02/16/2017 1457   AST 23 02/16/2017 1457   ALT 15 02/16/2017 1457   ALKPHOS 59 02/16/2017 1457   BILITOT 0.8 02/16/2017 1457   GFRNONAA 42 (L) 04/23/2017 1534   GFRNONAA 46 (L) 02/16/2017 1457   GFRAA 49 (L) 04/23/2017 1534   GFRAA 53 (L) 02/16/2017 1457   Lab Results  Component Value Date   CHOL 167 08/01/2015   HDL 61.40 08/01/2015   LDLCALC 81 08/01/2015   LDLDIRECT 142.0  05/24/2015   TRIG 119.0 08/01/2015   CHOLHDL 3 08/01/2015   Lab Results  Component Value Date   HGBA1C 5.6 04/12/2015   Lab Results  Component Value Date   SKAJGOTL57 262 10/24/2014   Lab Results  Component Value Date   TSH 1.00 12/29/2014      ASSESSMENT AND PLAN 81 y.o. year old female  has a past medical history of Carotid artery occlusion; CKD (chronic kidney disease); COPD (chronic obstructive pulmonary disease) ([place]); Diverticulosis; Fall at home (Sept. 2013); Hyperlipidemia; Hypertension; Internal  hemorrhoid; and Memory disorder (10/24/2014). here with:   1. Memory disturbance   The patient's memory score has remained stable. She will continue on Aricept and Namenda. I have advised that if her symptoms worsen or she develops new symptoms she should let us know. She will follow-up in 6 months or sooner if needed.  I spent 15 minutes with the patient. 50% of this time was spent Reviewing her memory score  And Plan of care.      Ward Givens, MSN, NP-C 05/13/2017, 3:11 PM [facility] [address] [place], [place] [address] [phone]

## 2017-05-13 NOTE — Patient Instructions (Signed)
Continue Aricept and Namenda Memory score is stable

## 2017-05-14 DIAGNOSIS — M19011 Primary osteoarthritis, right shoulder: Secondary | ICD-10-CM | POA: Diagnosis not present

## 2017-05-14 DIAGNOSIS — M1711 Unilateral primary osteoarthritis, right knee: Secondary | ICD-10-CM | POA: Diagnosis not present

## 2017-05-18 DIAGNOSIS — M1711 Unilateral primary osteoarthritis, right knee: Secondary | ICD-10-CM | POA: Diagnosis not present

## 2017-05-20 DIAGNOSIS — M1711 Unilateral primary osteoarthritis, right knee: Secondary | ICD-10-CM | POA: Diagnosis not present

## 2017-05-21 ENCOUNTER — Ambulatory Visit: Payer: Medicare Other | Admitting: Podiatry

## 2017-05-27 DIAGNOSIS — M1711 Unilateral primary osteoarthritis, right knee: Secondary | ICD-10-CM | POA: Diagnosis not present

## 2017-05-28 ENCOUNTER — Encounter (HOSPITAL_COMMUNITY): Payer: Medicare Other

## 2017-05-28 ENCOUNTER — Ambulatory Visit: Payer: Medicare Other | Admitting: Family

## 2017-06-01 ENCOUNTER — Encounter: Payer: Self-pay | Admitting: Allergy & Immunology

## 2017-06-01 ENCOUNTER — Ambulatory Visit (INDEPENDENT_AMBULATORY_CARE_PROVIDER_SITE_OTHER): Payer: Medicare Other | Admitting: Allergy & Immunology

## 2017-06-01 VITALS — BP 128/68 | HR 68 | Resp 16 | Ht 63.0 in | Wt 195.0 lb

## 2017-06-01 DIAGNOSIS — T781XXD Other adverse food reactions, not elsewhere classified, subsequent encounter: Secondary | ICD-10-CM

## 2017-06-01 DIAGNOSIS — J449 Chronic obstructive pulmonary disease, unspecified: Secondary | ICD-10-CM

## 2017-06-01 DIAGNOSIS — J310 Chronic rhinitis: Secondary | ICD-10-CM

## 2017-06-01 NOTE — Progress Notes (Deleted)
NEW PATIENT  Date of Service/Encounter:  06/01/17  Referring provider: Lauree Chandler, NP   Assessment:   No diagnosis found.   Asthma Reportables:  Severity: {Blank single:19197::"intermittent","moderate persistent","severe persistent","mild persistent"}  Risk: {DESC; LOW/MEDIUM/HIGH:23084} Control: {Asthma Control (Reporting):20434}  Seasonal Influenza Vaccine: {Blank single:19197::"no but encouraged","refused","yes"}   PPSV-23 Vaccine (CDC recommended for patients with persistent asthma 19-64yo): {Responses; yes/no/refused:32142}    Plan/Recommendations:    There are no Patient Instructions on file for this visit.    Subjective:   Virginia [name] is a 81 y.o. female presenting today for evaluation of No chief complaint on file.   Virginia [name] has a history of the following: Patient Active Problem List   Diagnosis Date Noted  . Acute respiratory failure with hypoxia ([place]) 02/01/2017  . Acute kidney injury ([place]) 02/01/2017  . Hypotension 02/01/2017  . CKD (chronic kidney disease), stage III 02/01/2017  . Cough 01/31/2016  . Osteoarthritis 01/17/2016  . Vitamin D deficiency 02/15/2015  . Hyperlipidemia 01/18/2015  . COPD GOLD IV 12/30/2014  . Chronic diastolic heart failure ([place]) 12/08/2014  . Dyspnea   . Congestive heart disease ([place])   . Hyponatremia 12/02/2014  . Essential hypertension 12/02/2014  . Nausea and vomiting 11/30/2014  . Memory disorder 10/24/2014  . Carotid stenosis 10/19/2012    History obtained from: chart review and ***.  Virginia [name] was referred by Lauree Chandler, NP.     Virginia [name] is a 81 y.o. female presenting for ***.      Asthma/Respiratory Symptom History:   Allergic Rhinitis Symptom History:    Food Allergy Symptom History:   ***Otherwise, there is no history of other atopic diseases, including asthma, drug allergies, food allergies, environmental allergies, stinging insect allergies, or urticaria. There is  no significant infectious history. ***Vaccinations are up to date.    Past Medical History: Patient Active Problem List   Diagnosis Date Noted  . Acute respiratory failure with hypoxia ([place]) 02/01/2017  . Acute kidney injury ([place]) 02/01/2017  . Hypotension 02/01/2017  . CKD (chronic kidney disease), stage III 02/01/2017  . Cough 01/31/2016  . Osteoarthritis 01/17/2016  . Vitamin D deficiency 02/15/2015  . Hyperlipidemia 01/18/2015  . COPD GOLD IV 12/30/2014  . Chronic diastolic heart failure ([place]) 12/08/2014  . Dyspnea   . Congestive heart disease ([place])   . Hyponatremia 12/02/2014  . Essential hypertension 12/02/2014  . Nausea and vomiting 11/30/2014  . Memory disorder 10/24/2014  . Carotid stenosis 10/19/2012    Medication List:  Allergies as of 06/01/2017      Reactions   Lidocaine Anaphylaxis, Swelling, Rash, Other (See Comments)   Any of the " [facility] "   Other Other (See Comments)   All drugs that end in "cane'-  novacane etc. Daughter states patient almost died when she had it when she was born      Medication List       Accurate as of 06/01/17  1:40 PM. Always use your most recent med list.          albuterol (2.5 MG/3ML) 0.083% nebulizer solution Commonly known as:  PROVENTIL Take 6 mLs (5 mg total) by nebulization every 4 (four) hours as needed for wheezing or shortness of breath.   ARTIFICIAL TEAR OP Place 2 drops into both eyes daily.   aspirin EC 81 MG tablet Take 81 mg by mouth every evening.   atorvastatin 20 MG tablet Commonly known as:  LIPITOR take 1 tablet by mouth  once daily   b complex vitamins capsule Take 1 capsule by mouth 2 (two) times a week.   CALCIUM-D PO Take 1 tablet by mouth every evening.   cetirizine 10 MG tablet Commonly known as:  ZYRTEC Take 10 mg by mouth daily.   diclofenac sodium 1 % Gel Commonly known as:  VOLTAREN Apply 1 application topically 2 (two) times daily as needed (shoulder and knee pain).     donepezil 10 MG tablet Commonly known as:  ARICEPT Take 10 mg by mouth daily.   fluticasone 50 MCG/ACT nasal spray Commonly known as:  FLONASE Place 2 sprays into both nostrils daily.   fluticasone furoate-vilanterol 100-25 MCG/INH Aepb Commonly known as:  BREO ELLIPTA Inhale 1 puff into the lungs daily.   furosemide 20 MG tablet Commonly known as:  LASIX Take 2 tablets by mouth in the a.m for 5 days then go to 1 tablet by mouth daily with the ok to take 1 extra tablet DAILY AS NEEDED FOR EDEMA/SWELLING   furosemide 40 MG tablet Commonly known as:  LASIX   ipratropium 0.06 % nasal spray Commonly known as:  ATROVENT Place 2 sprays into both nostrils 2 (two) times daily.   ipratropium-albuterol 0.5-2.5 (3) MG/3ML Soln Commonly known as:  DUONEB Take 3 mLs by nebulization every 6 (six) hours.   memantine 10 MG tablet Commonly known as:  NAMENDA Take 1 tablet (10 mg total) by mouth 2 (two) times daily.   nebivolol 5 MG tablet Commonly known as:  BYSTOLIC Take 1 tablet (5 mg total) by mouth daily. *Please call and schedule an appointment*   ondansetron 4 MG disintegrating tablet Commonly known as:  ZOFRAN ODT Take 1 tablet (4 mg total) by mouth every morning.   pantoprazole 40 MG tablet Commonly known as:  PROTONIX Take 1 tablet (40 mg total) by mouth daily.   PRESERVISION AREDS 2 Caps Take 1 capsule by mouth 2 (two) times daily.   ranitidine 150 MG tablet Commonly known as:  ZANTAC Take 150 mg by mouth daily.   VAYACOG 100-19.5-6.5 MG Caps Generic drug:  Phosphatidylserine-DHA-EPA take 1 capsule by mouth once daily       Birth History: ***non-contributory. Born at term without complications.   Developmental History: Jamyra has met all milestones on time. She has required no speech therapy, occupational therapy, or physical therapy. ***non-contributory.   Past Surgical History: Past Surgical History:  Procedure Laterality Date  . APPENDECTOMY    . BREAST  REDUCTION SURGERY    . CAROTID ENDARTERECTOMY     left CEA  . CATARACT EXTRACTION Bilateral   . COLONOSCOPY  2015  . EYE SURGERY     cataracts removed, bilaterally   . skin cancer removal    . TONSILLECTOMY       Family History: Family History  Problem Relation Age of Onset  . Heart disease Father        Before age 51  . Hypertension Father   . Heart attack Father   . Cancer Mother 69       Brain  . Dementia Brother   . Cancer Maternal Aunt 61       breast cancer  . Diabetes Maternal Aunt   . Heart failure Maternal Grandmother   . Diabetes Maternal Grandmother   . Stroke Neg Hx      Social History: Joeanne lives at home with ***.     Review of Systems: a 14-point review of systems is pertinent for what is  mentioned in HPI.  Otherwise, all other systems were negative. Constitutional: negative other than that listed in the HPI Eyes: negative other than that listed in the HPI Ears, nose, mouth, throat, and face: negative other than that listed in the HPI Respiratory: negative other than that listed in the HPI Cardiovascular: negative other than that listed in the HPI Gastrointestinal: negative other than that listed in the HPI Genitourinary: negative other than that listed in the HPI Integument: negative other than that listed in the HPI Hematologic: negative other than that listed in the HPI Musculoskeletal: negative other than that listed in the HPI Neurological: negative other than that listed in the HPI Allergy/Immunologic: negative other than that listed in the HPI    Objective:   There were no vitals taken for this visit. There is no height or weight on file to calculate BMI.   Physical Exam:  General: Alert, interactive, in no acute distress. Eyes: {Blank multiple:19196::"No conjunctival injection present on the right","No conjunctival injection present on the left","Conjunctival injection on the right with limbal sparing","Conjunctival injection on the  left with limbal sparing","PERRL bilaterally","No discharge on the right","No discharge on the left","No Horner-Trantas dots present","allergic shiners present bilaterally","***"} Ears: {Blank multiple:19196::"Right TM pearly gray with normal light reflex","Left TM pearly gray with normal light reflex","Right TM erythematous but not bulging","Left TM erythematous but not bulging","Right TM erythematous and bulging","Left TM erythematous and bulging","Right OME","Left OME","Right TM intact without perforation","Left TM intact without perforation","Right TM unable to be visualized due to cerumen impaction","Left TM unable to be visualized due to cerumen impaction","***"}.  Nose/Throat: {Blank multiple:19196::"External nose within normal limits","nasal crease present","septum midline"}, turbinates {Blank single:19197::"non-edematous","edematous","edematous and pale","markedly edematous","markedly edematous and pale","moderately edematous","mildly edematous","minimally edematous"} {Blank single:19197::"with crusty discharge","with thick discharge","with clear discharge","without discharge"}, post-pharynx {Blank single:19197::"unremarkable","non erythematous","erythematous","markedly erythematous","moderately erythematous","mildly erythematous"} {Blank single:19197::"with cobblestoning in the posterior oropharynx","without cobblestoning in the posterior oropharynx"}. Tonsils {Blank single:19197::"surgically absent","unremarklable","3+","4+","touching","2+"} {Blank single:19197::"with exudates","without exudates"} Neck: Supple without thyromegaly.  Adenopathy: {Blank multiple:19196::"Shoddy bilateral anterior cervical lymphadenopathy.","No enlarged lymph nodes appreciated in the occipital, axillary, epitrochlear, inguinal, or popliteal regions.","No enlarged lymph nodes appreciated in the anterior cervical, occipital, axillary, epitrochlear, inguinal, or popliteal regions."} Lungs: {Blank single:19197::"Decreased  breath sounds with expiratory wheezing bilaterally","Mildly decreased breath sounds with expiratory wheezing bilaterally","Decreased breath sounds bilaterally without wheezing, rhonchi or rales","Mildly decreased breath sounds bilaterally without wheezing, rhonchi or rales","Clear to auscultation without wheezing, rhonchi or rales"}. {Blank single:19197::"Increased work of breathing","No increased work of breathing"}. CV: {Blank single:19197::"Physiologic splitting of S1/S2","Normal S1/S2"}, no murmurs. Capillary refill <2 seconds.  Abdomen: Nondistended, nontender. No guarding or rebound tenderness. Bowel sounds {Blank multiple:19196::"absent","faint","present in all fields","hypoactive","hyperactive"}  Skin: {Blank single:19197::"Dry, erythematous, excoriated patches on the ***","Dry, hyperpigmented, thickened patches on the ***","Dry, mildly hyperpigmented, mildly thickened patches on the ***","Scattered erythematous urticarial type lesions primarily located *** , nonvesicular","Warm and dry, without lesions or rashes"}. Extremities:  No clubbing, cyanosis or edema. Neuro:   Grossly intact. No focal deficits appreciated. Responsive to questions.  Diagnostic studies: none***/deferred due to recent antihistamine use***  Spirometry: {Blank single:19197::"results normal (FEV1: ***%, FVC: ***%, FEV1/FVC: ***%)","results abnormal (FEV1: ***%, FVC: ***%, FEV1/FVC: ***%)"}.    {Blank single:19197::"Spirometry consistent with mild obstructive disease","Spirometry consistent with moderate obstructive disease","Spirometry consistent with severe obstructive disease","Spirometry consistent with possible restrictive disease","Spirometry consistent with mixed obstructive and restrictive disease","Spirometry uninterpretable due to technique","Spirometry consistent with normal pattern"}. {Blank single:19197::"Albuterol/Atrovent nebulizer","Xopenex/Atrovent nebulizer","Albuterol nebulizer"} treatment given in clinic  with {Blank single:19197::"significant improvement","no improvement"}.  Allergy Studies: none***  ***Indoor/Outdoor Percutaneous Adult Environmental Panel: positive to {Blank multiple:19196::"bahia  grass","Bermuda grass","johnson grass","[place] blue grass","meadow fescue grass","perennial rye grass","sweet vernal grass","timothy grass","cocklebur","burweed marsh elder","short ragweed","giant ragweed","English plantain","lamb's quarters","sheep sorrel","rough pigweed","rough marsh elder","common [employer] beech","Box elder","red cedar","eastern cottonwood","elm","hickory","maple","oak","pecan pollen","pine","Eastern sycamore","black walnut pollen","Alternaria","Cladosporium","Aspergillus","Penicillium","Bipolaris","Drechslera","Mucor","Fusarium","Aureobasidium","Rhizopus","Botrytis","epicoccum","Phoma","Candida","Tricophyton","Df mite","Dp mites","cat","dog","mixed feather","horse","cockroach","mouse","tobacco"}. Otherwise negative with adequate controls.  ***Indoor/Outdoor Percutaneous Pediatric Environmental Panel: positive to {Blank multiple:19196::"Bermuda grass","[place] blue grass","perennial rye grass","timothy grass","short ragweed","giant ragweed","Birch","Hickory","Oak","Alternaria","Cladosporium","Aspergillus","Penicillium","Bipolaris","Drechslera","Mucor","Fusarium","Aureobasidium","Rhizopus","Epicoccum","Phoma","D farinae dust mite","Cat","Dog","D pteronyssinus dust mite","mixed feather","cockroach","Candida"}. Otherwise negative with adequate controls.  ***Indoor/Outdoor Selected Intradermal Environmental Panel: positive to {Blank multiple:19196::"Bermuda grass","Johnson grass","Grass mix","ragweed mix","weed mix","tree mix","mold mix #1","mold mix #2","mold mix #3","mold mix #4","cat","dog","cockroach","mite mix"}. Otherwise negative with adequate controls.  ***Most Common Foods Panel (peanut, cashew, soy, fish mix, shellfish mix, wheat, milk, egg):  ***Full Food  Panel: positive to {Blank multiple:19196::"Peanut (*** x ***)","Soy (*** x ***)","Wheat (*** x ***)","Corn (*** x ***)","Milk (*** x ***)","Egg (*** x ***)","Casein (*** x ***)","Shellfish Mix (*** x ***)","Fish Mix (*** x ***)","Cashew (*** x ***)","Tomato (*** x ***)","Orange (*** x ***)","Rice (*** x ***)","Pork (*** x ***)","[place] (*** x ***)","Beef (*** x ***)","Lamb (*** x ***)","Chicken (*** x ***)","White Potato (*** x ***)","Banana (*** x ***)","Apple (*** x ***)","Peach (*** x ***)","Sweet Potato (*** x ***)","Green Pea (*** x ***)","Strawberry (*** x ***)","Onion (*** x ***)","Cabbage (*** x ***)","Carrots (*** x ***)","Celery (*** x ***)","Karaya Gum (*** x ***)","Acacia (Arabic Gum) (*** x ***)","Cottonseed (*** x ***)","Flounder (*** x ***)","Trout (*** x ***)","Shrimp (*** x ***)","Crab (*** x ***)","Tuna (*** x ***)","Cantaloupe (*** x ***)","Watermelon (*** x ***)","Pecan (*** x ***)","Walnut (*** x ***)","Chocolate (*** x ***)","Grape (*** x ***)","Cucumber (*** x ***)","Saccharomycese Cerevisiae (*** x ***)","Oyster (*** x ***)","Lobster (*** x ***)","Scallop (*** x ***)","Salmon (*** x ***)","Almond (*** x ***)","Hazelnut (*** x ***)","[place] nut (*** x ***)","Coconut (*** x ***)","Cinnamon (*** x ***)","Nutmeg (*** x ***)","Ginger (*** x ***)","Garlic (*** x ***)","Black Pepper (*** x ***)","Mustard (*** x ***)","Barley (*** x ***)","Oat (*** x ***)","Rye (*** x ***)","Sesame (*** x ***)","Pineapple (*** x ***)"} with adequate controls. Negative to {Blank multiple:19196::"Peanut","Soy","Wheat","Corn","Milk","Egg","Casein","Shellfish Mix","Fish Mix","Cashew","Tomato","Orange","Rice","Pork","Turkey","Beef","Lamb","Chicken","White Potato","Banana","Apple","Peach","Sweet Potato","Green Pea","Strawberry","Onion","Cabbage","Carrots","Celery","Karaya Gum","Acacia (Arabic  Gum)","Cottonseed","Flounder","Trout","Shrimp","Crab","Tuna","Cantaloupe","Watermelon","Pecan","Walnut","Chocolate","Grape","Cucumber","Saccharomycese Cerevisiae","Oyster","Lobster","Scallop","Salmon","Almond","Hazelnut","Brazil nut","Coconut","Cinnamon","Nutmeg","Ginger","Garlic","Black Pepper","Mustard","Barley","Oat","Rye","Sesame","Pineapple"}   ***Selected Foods Panel:   Rochele Pages, MD Internal Medicine PGY1

## 2017-06-01 NOTE — Progress Notes (Signed)
NEW PATIENT  Date of Service/Encounter:  06/01/17  Referring provider: Lauree Chandler, NP   Assessment:   Chronic obstructive pulmonary disease  Chronic non-allergic rhinitis   Adverse food reaction - possibly fruits and coffee (with negative testing today)   Plan/Recommendations:   1. Chronic obstructive pulmonary disease - Lung testing was not great today, but you did improve markedly with the nebulizer treatment. - I would highly recommend that you use the Breo on a daily basis for the best effect. - This contains a long acting steroid to help prevent inflammation combined with a long-acting form of albuterol to keep the lungs open for 24 hours.  - Daily controller medication(s): Breo 100/25 one puff once daily - Rescue medications: albuterol 4 puffs every 4-6 hours as needed, albuterol nebulizer one vial puffs every 4-6 hours as needed or DuoNeb nebulizer one vial every 4-6 hours as needed - Asthma control goals:  * Full participation in all desired activities (may need albuterol before activity) * Albuterol use two time or less a week on average (not counting use with activity) * Cough interfering with sleep two time or less a month * Oral steroids no more than once a year * No hospitalizations  2. Chronic non-allergic rhinitis - Testing today showed: negative to the entire panel  - This is not surprising since the histamine (allergic) response is sometimes blunted in elderly patients.  - We will get blood testing to see if there is any evidence of allergies. - Stop the fluticasone (Flonase), ipratropium (Atrovent), and Zyrtec (cetirizine) for now. - Start Xyzal (levocetirizine) 5mg  tablet once daily and Dymista (fluticasone/azelastine) two sprays per nostril 1-2 times daily as needed - You can use an extra dose of the antihistamine, if needed, for breakthrough symptoms.  - Consider nasal saline rinses 1-2 times daily to remove allergens from the nasal cavities as  well as help with mucous clearance (this is especially helpful to do before the nasal sprays are given). - Consider allergy shots as a means of long-term control (if the blood testing showed evidence of allergies)  3. Adverse food reaction - Testing was negative to all of the most common food allergens (peanut, tree nut, soy, fish mix, shellfish mix, wheat, milk, egg). - Testing was also negative to all of the fruits tested (bananas, apples, grape, cantaloupe, watermelon, peach). - We will get blood testing to look for an allergy to coffee as well as allergies to multiple fruits.  - There is no need for an EpiPen at this time.   4. Return in about 4 weeks (around 06/29/2017).   Subjective:   Virginia [name] is a 81 y.o. female presenting today for evaluation of allergy symptoms and shortness of breath.   Chief Complaint  Patient presents with  . New Patient (Initial Visit)  . Asthma    Virginia [name] has a history of the following: Patient Active Problem List   Diagnosis Date Noted  . Acute respiratory failure with hypoxia ([place]) 02/01/2017  . Acute kidney injury ([place]) 02/01/2017  . Hypotension 02/01/2017  . CKD (chronic kidney disease), stage III 02/01/2017  . Cough 01/31/2016  . Osteoarthritis 01/17/2016  . Vitamin D deficiency 02/15/2015  . Hyperlipidemia 01/18/2015  . COPD GOLD IV 12/30/2014  . Chronic diastolic heart failure ([place]) 12/08/2014  . Dyspnea   . Congestive heart disease ([place])   . Hyponatremia 12/02/2014  . Essential hypertension 12/02/2014  . Nausea and vomiting 11/30/2014  . Memory disorder 10/24/2014  .  Carotid stenosis 10/19/2012    History obtained from: chart review and the patient, the patient's son and brother.  Virginia [name] was referred by Virginia Chandler, NP.     Virginia [name] is a 81 y.o. female presenting for evaluation of allergies and shortness of breath. The patient is accompanied by her son and brother who provided most of the history.       Asthma/Respiratory Symptom History: Past medical history of COPD. Was admitted in March of 2018 for COPD exacerbation. She was discharged on supplemental oxygen and Duoneb breathing treatments. Since her discharge her Duoneb treatments have decreased from 2-3 times daily to about once monthly. She had one Duoneb treatment this past week.  Per patient's brother her oxygen saturations have been WNL and she has not required supplemental oxygen for the past 6-8 weeks. The patient is prescribed Breo, but has only been taking it intermittently. Upon further questioning, her brother reports that he is not entirely comfortable with using it and would like to be instructed on how to use it. This is very low of their priority list, per the patient's brother, who is the primary caretaker at this point.   The patient complains of shortness of breath upon exertion. Walking only a short distance will trigger her shortness of breath. She denies symptoms at rest and chest pain. The patient states that she "does not notice" her shortness of breath, but per her son and brother it is getting progressively worse. She is unable to walk down her hallway without getting winded, but per the patient "it is a loooong hallway". Aside from the hospitalization in March, she is rarely if ever admitted for her breathing. She does not routinely need prednisone according to the patient's son and brother.   Ms Bar was followed by Dr. Melvyn Novas, but accoridng to her caregivers, they were told that her disease could be managed by her PCP instead.   Allergic Rhinitis Symptom History:  Per patient's son she has had allergies her entire life, but began having worsening allergic symptoms 2 years ago. The patient's son says she experiences a lot of mucosal drainage, post nasal drip, and sneezing. She is constantly clearing her throat throughout the day. She also experiences itchy watery eyes. These symptoms occur all year round, but seem to be worse  in the spring and fall.  There are no changes in her symptoms inside versus outside. Currently the patient takes zyrtec, atrovent, and flonase but these do not seem to alleviate her symptoms entirely although they do help somewhat. She does not have any pets. She has never been allergy tested and has never been on allergy shots. She was born and raised in [place].   Food Allergy Symptom History: The patient's son also expressed concern about a possible food allergy. The patient experiences nausea every morning after drinking 1.5 cups of coffee and eating fruit. She takes Zofran for nausea relief, which helps somewhat. She denies any rash, shortness of breath, vomiting, or diarrhea associated with foods. She eats dairy, wheat products, tree nuts, and seafood without any adverse reaction. She does not care for eggs, but has never had a bad reaction to them. She does eat peanut butter crackers without a problem. She has never needed any epinephrine.  GERD Symptom History: Ms. Balthazar does have a history of GERD. She is on both Zantac as well as Protonix with minimal improvement in the symptoms. She does not see a gastroenterologist. She thinks that she is "completely  fine" and she does not know "why I am here".    She does have a history of an accident that occurred 2-3 years ago. Since this time, she has had memory issues. Otherwise, there is no history of other atopic diseases, including asthma, drug allergies, food allergies, environmental allergies, stinging insect allergies, or urticaria. There is no significant infectious history. Vaccinations are up to date.    Past Medical History: Patient Active Problem List   Diagnosis Date Noted  . Acute respiratory failure with hypoxia ([place]) 02/01/2017  . Acute kidney injury ([place]) 02/01/2017  . Hypotension 02/01/2017  . CKD (chronic kidney disease), stage III 02/01/2017  . Cough 01/31/2016  . Osteoarthritis 01/17/2016  . Vitamin D deficiency  02/15/2015  . Hyperlipidemia 01/18/2015  . COPD GOLD IV 12/30/2014  . Chronic diastolic heart failure ([place]) 12/08/2014  . Dyspnea   . Congestive heart disease ([place])   . Hyponatremia 12/02/2014  . Essential hypertension 12/02/2014  . Nausea and vomiting 11/30/2014  . Memory disorder 10/24/2014  . Carotid stenosis 10/19/2012    Medication List:  Allergies as of 06/01/2017      Reactions   Lidocaine Anaphylaxis, Swelling, Rash, Other (See Comments)   Any of the " [facility] "   Other Other (See Comments)   All drugs that end in "cane'-  novacane etc. Daughter states patient almost died when she had it when she was born      Medication List       Accurate as of 06/01/17  3:58 PM. Always use your most recent med list.          albuterol (2.5 MG/3ML) 0.083% nebulizer solution Commonly known as:  PROVENTIL Take 6 mLs (5 mg total) by nebulization every 4 (four) hours as needed for wheezing or shortness of breath.   ARTIFICIAL TEAR OP Place 2 drops into both eyes daily.   aspirin EC 81 MG tablet Take 81 mg by mouth every evening.   atorvastatin 20 MG tablet Commonly known as:  LIPITOR take 1 tablet by mouth once daily   b complex vitamins capsule Take 1 capsule by mouth 2 (two) times a week.   CALCIUM-D PO Take 1 tablet by mouth every evening.   cetirizine 10 MG tablet Commonly known as:  ZYRTEC Take 10 mg by mouth daily.   diclofenac sodium 1 % Gel Commonly known as:  VOLTAREN Apply 1 application topically 2 (two) times daily as needed (shoulder and knee pain).   donepezil 10 MG tablet Commonly known as:  ARICEPT Take 10 mg by mouth daily.   fluticasone 50 MCG/ACT nasal spray Commonly known as:  FLONASE Place 2 sprays into both nostrils daily.   fluticasone furoate-vilanterol 100-25 MCG/INH Aepb Commonly known as:  BREO ELLIPTA Inhale 1 puff into the lungs daily.   furosemide 20 MG tablet Commonly known as:  LASIX Take 2 tablets by mouth in the a.m for 5  days then go to 1 tablet by mouth daily with the ok to take 1 extra tablet DAILY AS NEEDED FOR EDEMA/SWELLING   ipratropium 0.06 % nasal spray Commonly known as:  ATROVENT Place 2 sprays into both nostrils 2 (two) times daily.   ipratropium-albuterol 0.5-2.5 (3) MG/3ML Soln Commonly known as:  DUONEB Take 3 mLs by nebulization every 6 (six) hours.   memantine 10 MG tablet Commonly known as:  NAMENDA Take 1 tablet (10 mg total) by mouth 2 (two) times daily.   nebivolol 5 MG tablet Commonly known  as:  BYSTOLIC Take 1 tablet (5 mg total) by mouth daily. *Please call and schedule an appointment*   ondansetron 4 MG disintegrating tablet Commonly known as:  ZOFRAN ODT Take 1 tablet (4 mg total) by mouth every morning.   pantoprazole 40 MG tablet Commonly known as:  PROTONIX Take 1 tablet (40 mg total) by mouth daily.   PRESERVISION AREDS 2 Caps Take 1 capsule by mouth 2 (two) times daily.   ranitidine 150 MG tablet Commonly known as:  ZANTAC Take 150 mg by mouth daily.   VAYACOG 100-19.5-6.5 MG Caps Generic drug:  Phosphatidylserine-DHA-EPA take 1 capsule by mouth once daily       Birth History: Non-contributory.  Developmental History: Non-contributory.   Past Surgical History: Past Surgical History:  Procedure Laterality Date  . APPENDECTOMY    . BREAST REDUCTION SURGERY    . CAROTID ENDARTERECTOMY     left CEA  . CATARACT EXTRACTION Bilateral   . COLONOSCOPY  2015  . EYE SURGERY     cataracts removed, bilaterally   . skin cancer removal    . TONSILLECTOMY       Family History: Family History  Problem Relation Age of Onset  . Heart disease Father        Before age 60  . Hypertension Father   . Heart attack Father   . Cancer Mother 65       Brain  . Dementia Brother   . Cancer Maternal Aunt 61       breast cancer  . Diabetes Maternal Aunt   . Heart failure Maternal Grandmother   . Diabetes Maternal Grandmother   . Stroke Neg Hx      Social  History: Declan lives in a house alone. She lives in a 79 year old home. There is one throughout the home. There is no [employer] issues. She does have dust mite covers on her bedding. There is no current tobacco exposure, but she does have a 25-pack-year history of smoking. She currently is cared for by her brother as well as her son.    Review of Systems: a 14-point review of systems is pertinent for what is mentioned in HPI.  Otherwise, all other systems were negative. Constitutional: negative other than that listed in the HPI Eyes: negative other than that listed in the HPI Ears, nose, mouth, throat, and face: negative other than that listed in the HPI Respiratory: negative other than that listed in the HPI Cardiovascular: negative other than that listed in the HPI Gastrointestinal: negative other than that listed in the HPI Genitourinary: negative other than that listed in the HPI Integument: negative other than that listed in the HPI Hematologic: negative other than that listed in the HPI Musculoskeletal: negative other than that listed in the HPI Neurological: negative other than that listed in the HPI Allergy/Immunologic: negative other than that listed in the HPI    Objective:   Blood pressure 128/68, pulse 68, resp. rate 16, height 5\' 3"  (1.6 m), weight 195 lb (88.5 kg). Body mass index is 34.54 kg/m.   Physical Exam:  General: Alert, interactive, in no acute distress. Pleasant but not entirely cooperative with the exam.  Eyes: PERRLA, EOMI, No conjunctival injection present on the right and No conjunctival injection present on the left Ears: Pinna normal in appearance, , Right TM pearly gray with normal light reflex and Left TM pearly gray with normal light reflex.  Nose/Throat: :, External nose within normal limits, nasal crease present and septum  midline, turbinates minimally edematous with crusty discharge, post-pharynx mildly erythematous without cobblestoning in the  posterior oropharynx. Tonsils unremarklable without exudates Neck: Supple without thyromegaly.  Adenopathy: [place]dy bilateral anterior cervical lymphadenopathy., No enlarged lymph nodes appreciated in the occipital, axillary, epitrochlear, inguinal, or popliteal regions. and No enlarged lymph nodes appreciated in the anterior cervical, occipital, axillary, epitrochlear, inguinal, or popliteal regions. Lungs: Clear to auscultation without wheezing, rhonchi or rales. No increased work of breathing. CV: Normal S1/S2, no murmurs. Capillary refill <2 seconds.  Abdomen: Nondistended, nontender. No guarding or rebound tenderness. Bowel sounds absent, faint, present in all fields, hypoactive and hyperactive  Skin: Warm and dry, without lesions or rashes. Extremities:  No clubbing, cyanosis or edema. Neuro:   Grossly intact. No focal deficits appreciated. Responsive to questions.  Diagnostic studies: Spirometry, Allergy Test routine Adult  Spirometry: results abnormal (FEV1: 40%, FVC: 50%, FEV1/FVC: 59%).    Spirometry consistent with mixed obstructive and restrictive disease. Albuterol nebulizer treatment given in clinic with significant improvement. Her FEV1 increased 160 mL (24%) and her FVC increased 360 mL (20%). Her FEF 25-75% increased 38%. This was significant for ATS criteria.  Allergy Studies:   Indoor/Outdoor Percutaneous Adult Environmental Panel: Negative with adequate controls.  Most Common Foods Panel (peanut, cashew, soy, fish mix, shellfish mix, wheat, milk, egg): Negative with adequate controls   Selected Foods Panel (fruits): Negative with adequate controls    Salvatore Marvel, MD [facility] [place]

## 2017-06-01 NOTE — Patient Instructions (Addendum)
1. Chronic obstructive pulmonary disease - Lung testing was not great today, but you did improve markedly with the nebulizer treatment. - I would highly recommend that you use the Breo on a daily basis for the best effect. - This contains a long acting steroid to help prevent inflammation combined with a long-acting form of albuterol to keep the lungs open for 24 hours.  - Daily controller medication(s): Breo 100/25 one puff once daily - Rescue medications: albuterol 4 puffs every 4-6 hours as needed, albuterol nebulizer one vial puffs every 4-6 hours as needed or DuoNeb nebulizer one vial every 4-6 hours as needed - Asthma control goals:  * Full participation in all desired activities (may need albuterol before activity) * Albuterol use two time or less a week on average (not counting use with activity) * Cough interfering with sleep two time or less a month * Oral steroids no more than once a year * No hospitalizations  2. Chronic rhinitis - Testing today showed: negative to the entire panel  - This is not surprising since the histamine (allergic) response is sometimes blunted in elderly patients.  - We will get blood testing to see if there is any evidence of allergies. - Stop the fluticasone (Flonase), ipratropium (Atrovent), and Zyrtec (cetirizine) for now. - Start Xyzal (levocetirizine) 5mg  tablet once daily and Dymista (fluticasone/azelastine) two sprays per nostril 1-2 times daily as needed - You can use an extra dose of the antihistamine, if needed, for breakthrough symptoms.  - Consider nasal saline rinses 1-2 times daily to remove allergens from the nasal cavities as well as help with mucous clearance (this is especially helpful to do before the nasal sprays are given). - Consider allergy shots as a means of long-term control (if the blood testing showed evidence of allergies)  3. Adverse food reaction - Testing was negative to all of the most common food allergens (peanut, tree  nut, soy, fish mix, shellfish mix, wheat, milk, egg). - Testing was also negative to all of the fruits tested (bananas, apples, grape, cantaloupe, watermelon, peach). - We will get blood testing to look for an allergy to coffee as well as allergies to multiple fruits.  - There is no need for an EpiPen at this time.   4. Return in about 4 weeks (around 06/29/2017).  Please inform us of any Emergency Department visits, hospitalizations, or changes in symptoms. Call us before going to the ED for breathing or allergy symptoms since we might be able to fit you in for a sick visit. Feel free to contact us anytime with any questions, problems, or concerns.  It was a pleasure to meet you and your family today! Happy summer!   Websites that have reliable patient information: 1. American Academy of Asthma, Allergy, and Immunology: www.aaaai.org 2. Food Allergy Research and Education (FARE): foodallergy.org 3. Mothers of Asthmatics: http://www.asthmacommunitynetwork.org 4. American College of Allergy, Asthma, and Immunology: www.acaai.org

## 2017-06-01 NOTE — Progress Notes (Deleted)
NEW PATIENT  Date of Service/Encounter:  06/01/17  Referring provider: Lauree Chandler, NP   Assessment:   Chronic obstructive pulmonary disease, unspecified COPD type ([place])  Chronic rhinitis  Adverse food reaction, subsequent encounter   Asthma Reportables:  Severity: {Blank single:19197::"intermittent","moderate persistent","severe persistent","mild persistent"}  Risk: {DESC; LOW/MEDIUM/HIGH:23084} Control: {Asthma Control (Reporting):20434}  Seasonal Influenza Vaccine: {Blank single:19197::"no but encouraged","refused","yes"}   PPSV-23 Vaccine (CDC recommended for patients with persistent asthma 19-64yo): {Responses; yes/no/refused:32142}    Plan/Recommendations:    There are no Patient Instructions on file for this visit.    Subjective:   Virginia [name] is a 81 y.o. female presenting today for evaluation of  Chief Complaint  Patient presents with  . New Patient (Initial Visit)  . Asthma    Virginia [name] has a history of the following: Patient Active Problem List   Diagnosis Date Noted  . Acute respiratory failure with hypoxia ([place]) 02/01/2017  . Acute kidney injury ([place]) 02/01/2017  . Hypotension 02/01/2017  . CKD (chronic kidney disease), stage III 02/01/2017  . Cough 01/31/2016  . Osteoarthritis 01/17/2016  . Vitamin D deficiency 02/15/2015  . Hyperlipidemia 01/18/2015  . COPD GOLD IV 12/30/2014  . Chronic diastolic heart failure ([place]) 12/08/2014  . Dyspnea   . Congestive heart disease ([place])   . Hyponatremia 12/02/2014  . Essential hypertension 12/02/2014  . Nausea and vomiting 11/30/2014  . Memory disorder 10/24/2014  . Carotid stenosis 10/19/2012    History obtained from: chart review and ***.  Virginia [name] was referred by Lauree Chandler, NP.     Beaulah is a 81 y.o. female presenting for ***.      Asthma/Respiratory Symptom History:   Allergic Rhinitis Symptom History:    Food Allergy Symptom History:   ***Otherwise,  there is no history of other atopic diseases, including asthma, drug allergies, food allergies, environmental allergies, stinging insect allergies, or urticaria. There is no significant infectious history. ***Vaccinations are up to date.    Past Medical History: Patient Active Problem List   Diagnosis Date Noted  . Acute respiratory failure with hypoxia ([place]) 02/01/2017  . Acute kidney injury ([place]) 02/01/2017  . Hypotension 02/01/2017  . CKD (chronic kidney disease), stage III 02/01/2017  . Cough 01/31/2016  . Osteoarthritis 01/17/2016  . Vitamin D deficiency 02/15/2015  . Hyperlipidemia 01/18/2015  . COPD GOLD IV 12/30/2014  . Chronic diastolic heart failure ([place]) 12/08/2014  . Dyspnea   . Congestive heart disease ([place])   . Hyponatremia 12/02/2014  . Essential hypertension 12/02/2014  . Nausea and vomiting 11/30/2014  . Memory disorder 10/24/2014  . Carotid stenosis 10/19/2012    Medication List:  Allergies as of 06/01/2017      Reactions   Lidocaine Anaphylaxis, Swelling, Rash, Other (See Comments)   Any of the " [facility] "   Other Other (See Comments)   All drugs that end in "cane'-  novacane etc. Daughter states patient almost died when she had it when she was born      Medication List       Accurate as of 06/01/17  3:13 PM. Always use your most recent med list.          albuterol (2.5 MG/3ML) 0.083% nebulizer solution Commonly known as:  PROVENTIL Take 6 mLs (5 mg total) by nebulization every 4 (four) hours as needed for wheezing or shortness of breath.   ARTIFICIAL TEAR OP Place 2 drops into both eyes daily.   aspirin EC  81 MG tablet Take 81 mg by mouth every evening.   atorvastatin 20 MG tablet Commonly known as:  LIPITOR take 1 tablet by mouth once daily   b complex vitamins capsule Take 1 capsule by mouth 2 (two) times a week.   CALCIUM-D PO Take 1 tablet by mouth every evening.   cetirizine 10 MG tablet Commonly known as:  ZYRTEC Take 10 mg  by mouth daily.   diclofenac sodium 1 % Gel Commonly known as:  VOLTAREN Apply 1 application topically 2 (two) times daily as needed (shoulder and knee pain).   donepezil 10 MG tablet Commonly known as:  ARICEPT Take 10 mg by mouth daily.   fluticasone 50 MCG/ACT nasal spray Commonly known as:  FLONASE Place 2 sprays into both nostrils daily.   fluticasone furoate-vilanterol 100-25 MCG/INH Aepb Commonly known as:  BREO ELLIPTA Inhale 1 puff into the lungs daily.   furosemide 20 MG tablet Commonly known as:  LASIX Take 2 tablets by mouth in the a.m for 5 days then go to 1 tablet by mouth daily with the ok to take 1 extra tablet DAILY AS NEEDED FOR EDEMA/SWELLING   ipratropium 0.06 % nasal spray Commonly known as:  ATROVENT Place 2 sprays into both nostrils 2 (two) times daily.   ipratropium-albuterol 0.5-2.5 (3) MG/3ML Soln Commonly known as:  DUONEB Take 3 mLs by nebulization every 6 (six) hours.   memantine 10 MG tablet Commonly known as:  NAMENDA Take 1 tablet (10 mg total) by mouth 2 (two) times daily.   nebivolol 5 MG tablet Commonly known as:  BYSTOLIC Take 1 tablet (5 mg total) by mouth daily. *Please call and schedule an appointment*   ondansetron 4 MG disintegrating tablet Commonly known as:  ZOFRAN ODT Take 1 tablet (4 mg total) by mouth every morning.   pantoprazole 40 MG tablet Commonly known as:  PROTONIX Take 1 tablet (40 mg total) by mouth daily.   PRESERVISION AREDS 2 Caps Take 1 capsule by mouth 2 (two) times daily.   ranitidine 150 MG tablet Commonly known as:  ZANTAC Take 150 mg by mouth daily.   VAYACOG 100-19.5-6.5 MG Caps Generic drug:  Phosphatidylserine-DHA-EPA take 1 capsule by mouth once daily       Birth History: ***non-contributory. Born at term without complications.   Developmental History: Virginia [name] has met all milestones on time. She has required no speech therapy, occupational therapy, or physical therapy. ***non-contributory.     Past Surgical History: Past Surgical History:  Procedure Laterality Date  . APPENDECTOMY    . BREAST REDUCTION SURGERY    . CAROTID ENDARTERECTOMY     left CEA  . CATARACT EXTRACTION Bilateral   . COLONOSCOPY  2015  . EYE SURGERY     cataracts removed, bilaterally   . skin cancer removal    . TONSILLECTOMY       Family History: Family History  Problem Relation Age of Onset  . Heart disease Father        Before age 55  . Hypertension Father   . Heart attack Father   . Cancer Mother 59       Brain  . Dementia Brother   . Cancer Maternal Aunt 61       breast cancer  . Diabetes Maternal Aunt   . Heart failure Maternal Grandmother   . Diabetes Maternal Grandmother   . Stroke Neg Hx      Social History: Leahmarie lives at home with ***.  Review of Systems: a 14-point review of systems is pertinent for what is mentioned in HPI.  Otherwise, all other systems were negative. Constitutional: negative other than that listed in the HPI Eyes: negative other than that listed in the HPI Ears, nose, mouth, throat, and face: negative other than that listed in the HPI Respiratory: negative other than that listed in the HPI Cardiovascular: negative other than that listed in the HPI Gastrointestinal: negative other than that listed in the HPI Genitourinary: negative other than that listed in the HPI Integument: negative other than that listed in the HPI Hematologic: negative other than that listed in the HPI Musculoskeletal: negative other than that listed in the HPI Neurological: negative other than that listed in the HPI Allergy/Immunologic: negative other than that listed in the HPI    Objective:   Blood pressure 128/68, pulse 68, resp. rate 16, height 5' 3"  (1.6 m), weight 195 lb (88.5 kg). Body mass index is 34.54 kg/m.   Physical Exam:  General: Alert, interactive, in no acute distress. Eyes: {Blank multiple:19196::"No conjunctival injection present on the  right","No conjunctival injection present on the left","Conjunctival injection on the right with limbal sparing","Conjunctival injection on the left with limbal sparing","PERRL bilaterally","No discharge on the right","No discharge on the left","No Horner-Trantas dots present","allergic shiners present bilaterally","***"} Ears: {Blank multiple:19196::"Right TM pearly gray with normal light reflex","Left TM pearly gray with normal light reflex","Right TM erythematous but not bulging","Left TM erythematous but not bulging","Right TM erythematous and bulging","Left TM erythematous and bulging","Right OME","Left OME","Right TM intact without perforation","Left TM intact without perforation","Right TM unable to be visualized due to cerumen impaction","Left TM unable to be visualized due to cerumen impaction","***"}.  Nose/Throat: {Blank multiple:19196::"External nose within normal limits","nasal crease present","septum midline"}, turbinates {Blank single:19197::"non-edematous","edematous","edematous and pale","markedly edematous","markedly edematous and pale","moderately edematous","mildly edematous","minimally edematous"} {Blank single:19197::"with crusty discharge","with thick discharge","with clear discharge","without discharge"}, post-pharynx {Blank single:19197::"unremarkable","non erythematous","erythematous","markedly erythematous","moderately erythematous","mildly erythematous"} {Blank single:19197::"with cobblestoning in the posterior oropharynx","without cobblestoning in the posterior oropharynx"}. Tonsils {Blank single:19197::"surgically absent","unremarklable","3+","4+","touching","2+"} {Blank single:19197::"with exudates","without exudates"} Neck: Supple without thyromegaly.  Adenopathy: {Blank multiple:19196::"Shoddy bilateral anterior cervical lymphadenopathy.","No enlarged lymph nodes appreciated in the occipital, axillary, epitrochlear, inguinal, or popliteal regions.","No enlarged lymph nodes  appreciated in the anterior cervical, occipital, axillary, epitrochlear, inguinal, or popliteal regions."} Lungs: {Blank single:19197::"Decreased breath sounds with expiratory wheezing bilaterally","Mildly decreased breath sounds with expiratory wheezing bilaterally","Decreased breath sounds bilaterally without wheezing, rhonchi or rales","Mildly decreased breath sounds bilaterally without wheezing, rhonchi or rales","Clear to auscultation without wheezing, rhonchi or rales"}. {Blank single:19197::"Increased work of breathing","No increased work of breathing"}. CV: {Blank single:19197::"Physiologic splitting of S1/S2","Normal S1/S2"}, no murmurs. Capillary refill <2 seconds.  Abdomen: Nondistended, nontender. No guarding or rebound tenderness. Bowel sounds {Blank multiple:19196::"absent","faint","present in all fields","hypoactive","hyperactive"}  Skin: {Blank single:19197::"Dry, erythematous, excoriated patches on the ***","Dry, hyperpigmented, thickened patches on the ***","Dry, mildly hyperpigmented, mildly thickened patches on the ***","Scattered erythematous urticarial type lesions primarily located *** , nonvesicular","Warm and dry, without lesions or rashes"}. Extremities:  No clubbing, cyanosis or edema. Neuro:   Grossly intact. No focal deficits appreciated. Responsive to questions.  Diagnostic studies: none***/deferred due to recent antihistamine use***  Spirometry: {Blank single:19197::"results normal (FEV1: ***%, FVC: ***%, FEV1/FVC: ***%)","results abnormal (FEV1: ***%, FVC: ***%, FEV1/FVC: ***%)"}.    {Blank single:19197::"Spirometry consistent with mild obstructive disease","Spirometry consistent with moderate obstructive disease","Spirometry consistent with severe obstructive disease","Spirometry consistent with possible restrictive disease","Spirometry consistent with mixed obstructive and restrictive disease","Spirometry uninterpretable due to technique","Spirometry consistent with  normal pattern"}. {Blank single:19197::"Albuterol/Atrovent nebulizer","Xopenex/Atrovent nebulizer","Albuterol nebulizer"} treatment given in clinic with {  Blank single:19197::"significant improvement","no improvement"}.  Allergy Studies: none***  ***Indoor/Outdoor Percutaneous Adult Environmental Panel: positive to {Blank multiple:19196::"bahia grass","Bermuda grass","johnson grass","[place] blue grass","meadow fescue grass","perennial rye grass","sweet vernal grass","timothy grass","cocklebur","burweed marsh elder","short ragweed","giant ragweed","English plantain","lamb's quarters","sheep sorrel","rough pigweed","rough marsh elder","common [employer] beech","Box elder","red cedar","eastern cottonwood","elm","hickory","maple","oak","pecan pollen","pine","Eastern sycamore","black walnut pollen","Alternaria","Cladosporium","Aspergillus","Penicillium","Bipolaris","Drechslera","Mucor","Fusarium","Aureobasidium","Rhizopus","Botrytis","epicoccum","Phoma","Candida","Tricophyton","Df mite","Dp mites","cat","dog","mixed feather","horse","cockroach","mouse","tobacco"}. Otherwise negative with adequate controls.  ***Indoor/Outdoor Percutaneous Pediatric Environmental Panel: positive to {Blank multiple:19196::"Bermuda grass","[place] blue grass","perennial rye grass","timothy grass","short ragweed","giant ragweed","Birch","Hickory","Oak","Alternaria","Cladosporium","Aspergillus","Penicillium","Bipolaris","Drechslera","Mucor","Fusarium","Aureobasidium","Rhizopus","Epicoccum","Phoma","D farinae dust mite","Cat","Dog","D pteronyssinus dust mite","mixed feather","cockroach","Candida"}. Otherwise negative with adequate controls.  ***Indoor/Outdoor Selected Intradermal Environmental Panel: positive to {Blank multiple:19196::"Bermuda grass","Johnson grass","Grass mix","ragweed mix","weed mix","tree mix","mold mix #1","mold mix #2","mold mix #3","mold mix #4","cat","dog","cockroach","mite mix"}.  Otherwise negative with adequate controls.  ***Most Common Foods Panel (peanut, cashew, soy, fish mix, shellfish mix, wheat, milk, egg):  ***Full Food Panel: positive to {Blank multiple:19196::"Peanut (*** x ***)","Soy (*** x ***)","Wheat (*** x ***)","Corn (*** x ***)","Milk (*** x ***)","Egg (*** x ***)","Casein (*** x ***)","Shellfish Mix (*** x ***)","Fish Mix (*** x ***)","Cashew (*** x ***)","Tomato (*** x ***)","Orange (*** x ***)","Rice (*** x ***)","Pork (*** x ***)","K[place] (*** x ***)","Beef (*** x ***)","Lamb (*** x ***)","Chicken (*** x ***)","White Potato (*** x ***)","Banana (*** x ***)","Apple (*** x ***)","Peach (*** x ***)","Sweet Potato (*** x ***)","Green Pea (*** x ***)","Strawberry (*** x ***)","Onion (*** x ***)","Cabbage (*** x ***)","Carrots (*** x ***)","Celery (*** x ***)","Karaya Gum (*** x ***)","Acacia (Arabic Gum) (*** x ***)","Cottonseed (*** x ***)","Flounder (*** x ***)","Trout (*** x ***)","Shrimp (*** x ***)","Crab (*** x ***)","Tuna (*** x ***)","Cantaloupe (*** x ***)","Watermelon (*** x ***)","Pecan (*** x ***)","Walnut (*** x ***)","Chocolate (*** x ***)","Grape (*** x ***)","Cucumber (*** x ***)","Saccharomycese Cerevisiae (*** x ***)","Oyster (*** x ***)","Lobster (*** x ***)","Scallop (*** x ***)","Salmon (*** x ***)","Almond (*** x ***)","Hazelnut (*** x ***)","B[place] nut (*** x ***)","Coconut (*** x ***)","Cinnamon (*** x ***)","Nutmeg (*** x ***)","Ginger (*** x ***)","Garlic (*** x ***)","Black Pepper (*** x ***)","Mustard (*** x ***)","Barley (*** x ***)","Oat (*** x ***)","Rye (*** x ***)","Sesame (*** x ***)","Pineapple (*** x ***)"} with adequate controls. Negative to {Blank multiple:19196::"Peanut","Soy","Wheat","Corn","Milk","Egg","Casein","Shellfish Mix","Fish Mix","Cashew","Tomato","Orange","Rice","Pork","Turkey","Beef","Lamb","Chicken","White Potato","Banana","Apple","Peach","Sweet Potato","Green  Pea","Strawberry","Onion","Cabbage","Carrots","Celery","Karaya Gum","Acacia (Arabic Gum)","Cottonseed","Flounder","Trout","Shrimp","Crab","Tuna","Cantaloupe","Watermelon","Pecan","Walnut","Chocolate","Grape","Cucumber","Saccharomycese Cerevisiae","Oyster","Lobster","Scallop","Salmon","Almond","Hazelnut","Brazil nut","Coconut","Cinnamon","Nutmeg","Ginger","Garlic","Black Pepper","Mustard","Barley","Oat","Rye","Sesame","Pineapple"}   ***Selected Foods Panel:     Salvatore Marvel, MD [facility]

## 2017-06-04 DIAGNOSIS — M19011 Primary osteoarthritis, right shoulder: Secondary | ICD-10-CM | POA: Diagnosis not present

## 2017-06-09 ENCOUNTER — Other Ambulatory Visit: Payer: Self-pay | Admitting: Allergy & Immunology

## 2017-06-09 DIAGNOSIS — J310 Chronic rhinitis: Secondary | ICD-10-CM | POA: Diagnosis not present

## 2017-06-09 DIAGNOSIS — T781XXD Other adverse food reactions, not elsewhere classified, subsequent encounter: Secondary | ICD-10-CM | POA: Diagnosis not present

## 2017-06-09 NOTE — Telephone Encounter (Signed)
Patient's brother called and said Virginia [name] was given a sample of Dymista, by Dr. Ernst Bowler, and she liked it and would like a prescription called in to [facility] on [address].

## 2017-06-10 DIAGNOSIS — M1711 Unilateral primary osteoarthritis, right knee: Secondary | ICD-10-CM | POA: Diagnosis not present

## 2017-06-10 LAB — CP584 ZONE 3
Allergen, A. alternata, m6: <0.1 kU/L
Allergen, C. Herbarum, M2: <0.1 kU/L
Allergen, Mucor Racemosus, M4: <0.1 kU/L
Allergen, Mulberry, t76: <0.1 kU/L
Allergen, P. notatum, m1: <0.1 kU/L
Allergen, S. Botryosum, m10: <0.1 kU/L
Bahia Grass: <0.1 kU/L
Bermuda Grass: <0.1 kU/L
Cockroach: <0.1 kU/L
Common Ragweed: <0.1 kU/L
D. farinae: <0.1 kU/L
Elm IgE: <0.1 kU/L
Johnson Grass: <0.1 kU/L
Meadow Grass: <0.1 kU/L
Pecan/Hickory Tree IgE: <0.1 kU/L
Plantain: <0.1 kU/L
Rough Pigweed  IgE: <0.1 kU/L

## 2017-06-10 LAB — ALLERGEN PEACH F95

## 2017-06-10 LAB — ALLERGEN, COFFEE, RF221

## 2017-06-10 LAB — ALLERGEN WATERMELON: Watermelon: <0.1 kU/L

## 2017-06-10 LAB — ALLERGEN, PINEAPPLE, F210: Allergen, Pineapple, f210: <0.1 kU/L

## 2017-06-10 LAB — ALLERGEN BANANA: Allergen Banana f92: <0.1 kU/L

## 2017-06-10 LAB — ALLERGEN, APPLE F49: Apple: <0.1 kU/L

## 2017-06-10 MED ORDER — AZELASTINE-FLUTICASONE 137-50 MCG/ACT NA SUSP: NASAL | 5 refills | Status: DC

## 2017-06-10 NOTE — Telephone Encounter (Signed)
Called patient. I informed patient that we sent in the [place] to the [facility] on [address].

## 2017-06-11 ENCOUNTER — Encounter (HOSPITAL_COMMUNITY): Payer: Medicare Other

## 2017-06-11 ENCOUNTER — Ambulatory Visit: Payer: Medicare Other | Admitting: Family

## 2017-06-11 ENCOUNTER — Telehealth: Payer: Self-pay | Admitting: *Deleted

## 2017-06-11 NOTE — Telephone Encounter (Signed)
Yes, ok to split. Please inform the patient. Thanks.

## 2017-06-11 NOTE — Telephone Encounter (Signed)
Yes, may split. Thanks.

## 2017-06-11 NOTE — Telephone Encounter (Signed)
Dymista is not covered by insurance please advise if ok to split dose

## 2017-06-12 ENCOUNTER — Telehealth: Payer: Self-pay

## 2017-06-12 MED ORDER — AZELASTINE HCL 0.1 % NA SOLN: 2.0000 | Freq: Two times a day (BID) | NASAL | 5 refills | Status: DC

## 2017-06-12 MED ORDER — FLUTICASONE PROPIONATE 50 MCG/ACT NA SUSP: 2.0000 | Freq: Every day | NASAL | 5 refills | Status: DC

## 2017-06-12 NOTE — Telephone Encounter (Signed)
Sent in scripts. I informed patient.

## 2017-06-12 NOTE — Telephone Encounter (Signed)
Patientcalled and asked about Dymista because the insurance would not cover it. I am splitting the Dymista and [facility].

## 2017-06-23 ENCOUNTER — Encounter: Payer: Self-pay | Admitting: Internal Medicine

## 2017-06-23 ENCOUNTER — Ambulatory Visit (INDEPENDENT_AMBULATORY_CARE_PROVIDER_SITE_OTHER): Payer: Medicare Other | Admitting: Internal Medicine

## 2017-06-23 VITALS — BP 128/64 | HR 72 | Ht 63.0 in | Wt 192.0 lb

## 2017-06-23 DIAGNOSIS — R1013 Epigastric pain: Secondary | ICD-10-CM | POA: Diagnosis not present

## 2017-06-23 DIAGNOSIS — K219 Gastro-esophageal reflux disease without esophagitis: Secondary | ICD-10-CM | POA: Diagnosis not present

## 2017-06-23 DIAGNOSIS — R110 Nausea: Secondary | ICD-10-CM | POA: Diagnosis not present

## 2017-06-23 DIAGNOSIS — I6523 Occlusion and stenosis of bilateral carotid arteries: Secondary | ICD-10-CM | POA: Diagnosis not present

## 2017-06-23 MED ORDER — ONDANSETRON 4 MG PO TBDP: 4.0000 mg | ORAL_TABLET | Freq: Three times a day (TID) | ORAL | 2 refills | Status: DC | PRN

## 2017-06-23 NOTE — Progress Notes (Signed)
   Subjective:    Patient ID: Virginia [name], female    DOB: [birth year], 81 y.o.   MRN: [id number]  HPI Virginia [name] is a 81 year old female with a history of Alzheimer's dementia, COPD, hypertension, hyperlipidemia who is seen for follow-up to evaluate nausea. She's here today with 2 family members, her brother and son. She was last seen on 11/04/2016. After that visit we ordered an upper GI series which was never performed. She no showed this appointment and her son admits to missing this due to an illness.  Patient minimizes symptoms as before. Her son feels that she is having intermittent issues with nausea, regurgitation and burping. He feels that it may be related to some of her coffee and fresh fruit intake. She is using pantoprazole 40 mg in the morning and Zantac in the evening. They are giving her Zofran on an as-needed basis 4 mg. Both her son and her brother feels that Zofran is helping. They also occasionally give her Pepto-Bismol and Tums which also seemed to help. She has not had further vomiting. Patient denies abdominal pain. They deny blood in her stool or melena. Her appetite has been good.  Her weight has been stable and is up 4 pounds today from last visit.   Review of Systems As per history of present illness, otherwise negative  Current Medications, Allergies, Past Medical History, Past Surgical History, Family History and Social History were reviewed in [employer] record.     Objective:   Physical Exam BP 128/64   Pulse 72   Ht 5\' 3"  (1.6 m)   Wt 192 lb (87.1 kg)   BMI 34.01 kg/m  Constitutional: Well-developed and well-nourished. No distress.Sitting in a chair HEENT: Normocephalic and atraumatic. Oropharynx is clear and moist. Conjunctivae are normal.  No scleral icterus. Neck: Neck supple. Trachea midline.  Pulmonary/chest: Effort normal and breath sounds normal. No wheezing, rales or rhonchi. Abdominal: Soft, obese, nontender, nondistended.  Bowel sounds active throughout.  Extremities: no clubbing, cyanosis, or edema, changes of chronic venous stasis bilateral lower extremities Neurological: Alert and oriented to person place and time. Skin: Skin is warm and dry. Psychiatric: Normal mood and affect. Behavior is normal.     Assessment & Plan:  81 year old female with a history of Alzheimer's dementia, COPD, hypertension, hyperlipidemia who is seen for follow-up to evaluate nausea.  1.  Nausea, chronic/GERD/dyspepsia -- her symptoms seem dyspeptic in nature. She is still having some nausea despite pantoprazole 40 mg daily and ranitidine in the evening. Some of this may be secondary to medication induced nausea. I suspicion for significant pathology is low. We have discussed this today together with her family. Given her persistent intermittent nausea we will reschedule the barium swallow/upper GI series. This is a noninvasive way without sedation to rule out overt upper GI pathology. --Continue pantoprazole 40 mg every morning, ranitidine 150 mg every afternoon. Continue Zofran 4 mg every 8 hours when necessary  25 minutes spent with the patient today. Greater than 50% was spent in counseling and coordination of care with the patient

## 2017-06-23 NOTE — Patient Instructions (Signed)
You have been scheduled for a Barium Esophogram and Upper GI Series at [facility] (1st floor of the hospital) on 07-01-17 at 1:00pm. Please arrive 30 minutes prior to your appointment for registration. Make certain not to have anything to eat or drink 4 hours prior to your test. If you need to reschedule for any reason, please contact radiology at [phone] to do so. __________________________________________________________________ A barium swallow is an examination that concentrates on views of the esophagus. This tends to be a double contrast exam (barium and two liquids which, when combined, create a gas to distend the wall of the oesophagus) or single contrast (non-ionic iodine based). The study is usually tailored to your symptoms so a good history is essential. Attention is paid during the study to the form, structure and configuration of the esophagus, looking for functional disorders (such as aspiration, dysphagia, achalasia, motility and reflux) EXAMINATION You may be asked to change into a gown, depending on the type of swallow being performed. A radiologist and radiographer will perform the procedure. The radiologist will advise you of the type of contrast selected for your procedure and direct you during the exam. You will be asked to stand, sit or lie in several different positions and to hold a small amount of fluid in your mouth before being asked to swallow while the imaging is performed .In some instances you may be asked to swallow barium coated marshmallows to assess the motility of a solid food bolus. The exam can be recorded as a digital or video fluoroscopy procedure. POST PROCEDURE It will take 1-2 days for the barium to pass through your system. To facilitate this, it is important, unless otherwise directed, to increase your fluids for the next 24-48hrs and to resume your normal diet.  This test typically takes about 30 minutes to  perform. _________________________________________________________________________   We have sent the following medications to your pharmacy for you to pick up at your convenience: Zofran  Please continue Protonix as prescribed by Dr. Dewaine Oats  Please purchase the following medications over the counter and take as directed: Zantac every evening.

## 2017-06-24 DIAGNOSIS — M1711 Unilateral primary osteoarthritis, right knee: Secondary | ICD-10-CM | POA: Diagnosis not present

## 2017-06-29 ENCOUNTER — Ambulatory Visit: Payer: Medicare Other | Admitting: Allergy & Immunology

## 2017-07-01 ENCOUNTER — Ambulatory Visit (HOSPITAL_COMMUNITY)
Admission: RE | Admit: 2017-07-01 | Discharge: 2017-07-01 | Disposition: A | Discharge status: Home / Self Care | Payer: Medicare Other | Source: Ambulatory Visit | Attending: Internal Medicine | Admitting: Internal Medicine

## 2017-07-01 ENCOUNTER — Ambulatory Visit (HOSPITAL_COMMUNITY): Admission: RE | Admit: 2017-07-01 | Payer: Medicare Other | Source: Ambulatory Visit

## 2017-07-01 DIAGNOSIS — R110 Nausea: Secondary | ICD-10-CM | POA: Insufficient documentation

## 2017-07-01 DIAGNOSIS — K449 Diaphragmatic hernia without obstruction or gangrene: Secondary | ICD-10-CM | POA: Insufficient documentation

## 2017-07-01 DIAGNOSIS — K219 Gastro-esophageal reflux disease without esophagitis: Secondary | ICD-10-CM | POA: Diagnosis not present

## 2017-07-06 ENCOUNTER — Ambulatory Visit (INDEPENDENT_AMBULATORY_CARE_PROVIDER_SITE_OTHER): Payer: Medicare Other | Admitting: Nurse Practitioner

## 2017-07-06 ENCOUNTER — Encounter: Payer: Self-pay | Admitting: Nurse Practitioner

## 2017-07-06 VITALS — BP 128/76 | HR 73 | Temp 98.5°F | Resp 18 | Ht 63.0 in | Wt 195.2 lb

## 2017-07-06 DIAGNOSIS — F419 Anxiety disorder, unspecified: Secondary | ICD-10-CM

## 2017-07-06 DIAGNOSIS — N183 Chronic kidney disease, stage 3 unspecified: Secondary | ICD-10-CM

## 2017-07-06 DIAGNOSIS — F0391 Unspecified dementia with behavioral disturbance: Secondary | ICD-10-CM

## 2017-07-06 DIAGNOSIS — M159 Polyosteoarthritis, unspecified: Secondary | ICD-10-CM

## 2017-07-06 DIAGNOSIS — I5032 Chronic diastolic (congestive) heart failure: Secondary | ICD-10-CM | POA: Diagnosis not present

## 2017-07-06 DIAGNOSIS — I6523 Occlusion and stenosis of bilateral carotid arteries: Secondary | ICD-10-CM

## 2017-07-06 DIAGNOSIS — K219 Gastro-esophageal reflux disease without esophagitis: Secondary | ICD-10-CM

## 2017-07-06 DIAGNOSIS — M150 Primary generalized (osteo)arthritis: Secondary | ICD-10-CM | POA: Diagnosis not present

## 2017-07-06 MED ORDER — DULOXETINE HCL 30 MG PO CPEP: 30.0000 mg | ORAL_CAPSULE | Freq: Every day | ORAL | 1 refills | Status: DC

## 2017-07-06 MED ORDER — FUROSEMIDE 20 MG PO TABS: 20.0000 mg | ORAL_TABLET | Freq: Every day | ORAL | 1 refills | Status: DC

## 2017-07-06 NOTE — Patient Instructions (Addendum)
6-8 eight oz glasses of water a day is recommended  Start Cymbalta 30 mg daily for anxiety Follow up in 4 weeks for anxiety   Food Choices for Gastroesophageal Reflux Disease, Adult When you have gastroesophageal reflux disease (GERD), the foods you eat and your eating habits are very important. Choosing the right foods can help ease the discomfort of GERD. Consider working with a diet and nutrition specialist (dietitian) to help you make healthy food choices. What general guidelines should I follow? Eating plan  Choose healthy foods low in fat, such as fruits, vegetables, whole grains, low-fat dairy products, and lean meat, fish, and poultry.  Eat frequent, small meals instead of three large meals each day. Eat your meals slowly, in a relaxed setting. Avoid bending over or lying down until 2-3 hours after eating.  Limit high-fat foods such as fatty meats or fried foods.  Limit your intake of oils, butter, and shortening to less than 8 teaspoons each day.  Avoid the following: ? Foods that cause symptoms. These may be different for different people. Keep a food diary to keep track of foods that cause symptoms. ? Alcohol. ? Drinking large amounts of liquid with meals. ? Eating meals during the 2-3 hours before bed.  Cook foods using methods other than frying. This may include baking, grilling, or broiling. Lifestyle   Maintain a healthy weight. Ask your health care provider what weight is healthy for you. If you need to lose weight, work with your health care provider to do so safely.  Exercise for at least 30 minutes on 5 or more days each week, or as told by your health care provider.  Avoid wearing clothes that fit tightly around your waist and chest.  Do not use any products that contain nicotine or tobacco, such as cigarettes and e-cigarettes. If you need help quitting, ask your health care provider.  Sleep with the head of your bed raised. Use a wedge under the mattress or  blocks under the bed frame to raise the head of the bed. What foods are not recommended? The items listed may not be a complete list. Talk with your dietitian about what dietary choices are best for you. Grains Pastries or quick breads with added fat. [place] toast. Vegetables Deep fried vegetables. [place] fries. Any vegetables prepared with added fat. Any vegetables that cause symptoms. For some people this may include tomatoes and tomato products, chili peppers, onions and garlic, and horseradish. Fruits Any fruits prepared with added fat. Any fruits that cause symptoms. For some people this may include citrus fruits, such as oranges, grapefruit, pineapple, and lemons. Meats and other protein foods High-fat meats, such as fatty beef or pork, hot dogs, ribs, ham, sausage, salami and bacon. Fried meat or protein, including fried fish and fried chicken. Nuts and nut butters. Dairy Whole milk and chocolate milk. Sour cream. Cream. Ice cream. Cream cheese. Milk shakes. Beverages Coffee and tea, with or without caffeine. Carbonated beverages. Sodas. Energy drinks. Fruit juice made with acidic fruits (such as orange or grapefruit). Tomato juice. Alcoholic drinks. Fats and oils Butter. Margarine. Shortening. Ghee. Sweets and desserts Chocolate and cocoa. Donuts. Seasoning and other foods Pepper. Peppermint and spearmint. Any condiments, herbs, or seasonings that cause symptoms. For some people, this may include curry, hot sauce, or vinegar-based salad dressings. Summary  When you have gastroesophageal reflux disease (GERD), food and lifestyle choices are very important to help ease the discomfort of GERD.  Eat frequent, small meals instead of  three large meals each day. Eat your meals slowly, in a relaxed setting. Avoid bending over or lying down until 2-3 hours after eating.  Limit high-fat foods such as fatty meat or fried foods. This information is not intended to replace advice given to you  by your health care provider. Make sure you discuss any questions you have with your health care provider. Document Released: 11/17/2005 Document Revised: 11/18/2016 Document Reviewed: 11/18/2016 Elsevier Interactive Patient Education  2017 [employer].

## 2017-07-06 NOTE — Progress Notes (Signed)
Careteam: Patient Care Team: Lauree Chandler, NP as PCP - General (Nurse Practitioner) Fay Records, MD as Consulting Physician (Cardiology) Tanda Rockers, MD as Consulting Physician (Pulmonary Disease)  Advanced Directive information Does Patient Have a Medical Advance Directive?: Yes, Type of Advance Directive: [place];Living will  Allergies  Allergen Reactions  . Lidocaine Anaphylaxis, Swelling, Rash and Other (See Comments)    Any of the " [facility] "  . Other Other (See Comments)    All drugs that end in "cane'-  novacane etc. Daughter states patient almost died when she had it when she was born    Chief Complaint  Patient presents with  . Medical Management of Chronic Issues    Pt is being seen for a 3 month routine visit. Pt states that her right shoulder and knee are hurting due to yoga partner falling on her this am.   . Other    Son and brother are in room with patient.      HPI: Patient is a 81 y.o. female seen in the office today for routine follow up.  Reports left shoulder had already been bothering her but then her yoga instructor lost balance and fell on her arm.  Pain is not any worse than it had been but she took 2 extra strength Bufferin about 1 hour ago.  Shoulder with chronic pain due to bursitis and arthritis and she is following with orthopedic Dr Stann Mainland, [facility] for this and going to PT Wednesday every week for shoulder and knee pain.  Son reports she has not moved shoulder much since incident and questions if she can, pt gets very frustrated with her son and states she can move it but does not want to.    Seeing Dr Hilarie Fredrickson and had upper GI series, was Dx with hiatal hernia.  Questions about food choices.   Discoloration to her lower legs. Swelling unchanged.  Worried about dehydration.   Taking her prescription medication but not all of her OTC medication- some pill fatigue. Family wanting her to take certain  supplements.   pts brother and son helping her but care is getting increasingly difficult. She has behaviors at time with increase in anxiety.  Pt and family feel like she should move to assistive living/memory care.   Review of Systems:  Review of Systems  Constitutional: Negative for chills, fever and weight loss.  Respiratory: Negative for cough, sputum production and shortness of breath.   Cardiovascular: Negative for chest pain, palpitations and leg swelling.  Gastrointestinal: Positive for heartburn. Negative for abdominal pain, constipation and diarrhea.  Genitourinary: Negative for dysuria, frequency and urgency.  Musculoskeletal: Positive for joint pain and myalgias. Negative for back pain and falls.  Skin: Negative.   Neurological: Negative for dizziness and headaches.  Psychiatric/Behavioral: Positive for memory loss. Negative for depression. The patient is nervous/anxious. The patient does not have insomnia.     Past Medical History:  Diagnosis Date  . Arthritis   . Carotid artery occlusion   . CKD (chronic kidney disease)   . COPD (chronic obstructive pulmonary disease) ([place])   . Diverticulosis   . Fall at home Sept. 2013  . Hyperlipidemia   . Hypertension   . Internal hemorrhoid   . Memory disorder 10/24/2014   Past Surgical History:  Procedure Laterality Date  . APPENDECTOMY    . BREAST REDUCTION SURGERY    . CAROTID ENDARTERECTOMY     left CEA  .  CATARACT EXTRACTION Bilateral   . COLONOSCOPY  2015  . EYE SURGERY     cataracts removed, bilaterally   . skin cancer removal    . TONSILLECTOMY     Social History:   reports that she quit smoking about 22 years ago. Her smoking use included Cigarettes. She has a 5.00 pack-year smoking history. She has never used smokeless tobacco. She reports that she does not drink alcohol or use drugs.  Family History  Problem Relation Age of Onset  . Heart disease Father        Before age 36  . Hypertension Father   .  Heart attack Father   . Cancer Mother 24       Brain  . Dementia Brother   . Cancer Maternal Aunt 61       breast cancer  . Diabetes Maternal Aunt   . Heart failure Maternal Grandmother   . Diabetes Maternal Grandmother   . Stroke Neg Hx     Medications: Patient's Medications  New Prescriptions   No medications on file  Previous Medications   ALBUTEROL (PROVENTIL) (2.5 MG/3ML) 0.083% NEBULIZER SOLUTION    Take 6 mLs (5 mg total) by nebulization every 4 (four) hours as needed for wheezing or shortness of breath.   ARTIFICIAL TEAR OP    Place 2 drops into both eyes daily.    ASPIRIN EC 81 MG TABLET    Take 81 mg by mouth every evening.   ATORVASTATIN (LIPITOR) 20 MG TABLET    take 1 tablet by mouth once daily   AZELASTINE (ASTELIN) 0.1 % NASAL SPRAY    Place 2 sprays into both nostrils 2 (two) times daily. Use in each nostril as directed   CALCIUM CARBONATE-VITAMIN D (CALCIUM-D PO)    Take 1 tablet by mouth every evening.   DICLOFENAC SODIUM (VOLTAREN) 1 % GEL    Apply 1 application topically 2 (two) times daily as needed (shoulder and knee pain).   DONEPEZIL (ARICEPT) 10 MG TABLET    Take 10 mg by mouth daily.    FLUTICASONE (FLONASE) 50 MCG/ACT NASAL SPRAY    Place 2 sprays into both nostrils daily.   FLUTICASONE FUROATE-VILANTEROL (BREO ELLIPTA) 100-25 MCG/INH AEPB    Inhale 1 puff into the lungs daily.   FUROSEMIDE (LASIX) 20 MG TABLET    Take 2 tablets by mouth in the a.m for 5 days then go to 1 tablet by mouth daily with the ok to take 1 extra tablet DAILY AS NEEDED FOR EDEMA/SWELLING   IPRATROPIUM-ALBUTEROL (DUONEB) 0.5-2.5 (3) MG/3ML SOLN    Take 3 mLs by nebulization every 6 (six) hours.   MEMANTINE (NAMENDA) 10 MG TABLET    Take 1 tablet (10 mg total) by mouth 2 (two) times daily.   MULTIPLE VITAMINS-MINERALS (PRESERVISION AREDS 2) CAPS    Take 1 capsule by mouth 2 (two) times daily.    NEBIVOLOL (BYSTOLIC) 5 MG TABLET    Take 1 tablet (5 mg total) by mouth daily. *Please call  and schedule an appointment*   ONDANSETRON (ZOFRAN ODT) 4 MG DISINTEGRATING TABLET    Take 1 tablet (4 mg total) by mouth every 8 (eight) hours as needed for nausea or vomiting.   PANTOPRAZOLE (PROTONIX) 40 MG TABLET    Take 1 tablet (40 mg total) by mouth daily.   RANITIDINE (ZANTAC) 150 MG TABLET    Take 150 mg by mouth daily.   VAYACOG 100-19.5-6.5 MG CAPS    take 1 capsule  by mouth once daily  Modified Medications   No medications on file  Discontinued Medications   AZELASTINE-FLUTICASONE (DYMISTA) 137-50 MCG/ACT SUSP    2 sprays per nostril 1-2 times daily.   B COMPLEX VITAMINS CAPSULE    Take 1 capsule by mouth 2 (two) times a week.    IPRATROPIUM (ATROVENT) 0.06 % NASAL SPRAY    Place 2 sprays into both nostrils 2 (two) times daily.     Physical Exam:  Vitals:   07/06/17 1449  BP: 128/76  Pulse: 73  Resp: 18  Temp: 98.5 F (36.9 C)  TempSrc: Oral  SpO2: 95%  Weight: 195 lb 3.2 oz (88.5 kg)  Height: 5\' 3"  (1.6 m)   Body mass index is 34.58 kg/m.  Physical Exam  Constitutional: She appears well-developed and well-nourished.  HENT:  Head: Normocephalic and atraumatic.  Nose: Nose normal.  Mouth/Throat: Oropharynx is clear and moist. No oropharyngeal exudate.  Eyes: Pupils are equal, round, and reactive to light. Conjunctivae and EOM are normal.  Neck: Normal range of motion. Neck supple.  Cardiovascular: Normal rate, regular rhythm and normal heart sounds.   Pulmonary/Chest: Effort normal. She has no wheezes.  Abdominal: Soft. Bowel sounds are normal. She exhibits no distension.  Musculoskeletal: She exhibits edema (1+ bilaterally).       Right shoulder: She exhibits decreased range of motion and tenderness.  varicosities to lower extremities  Decrease ROM to right shoulder but this is baseline, no decrease in ROM or increase in tenderness or swelling.   Neurological: She is alert.  Skin: Skin is warm and dry.    Labs reviewed: Basic Metabolic Panel:  Recent  Labs  02/01/17 1857  02/16/17 1457 04/15/17 1529 04/23/17 1534  NA  --   < > 143 142 144  K  --   < > 4.3 5.0 5.0  CL  --   < > 105 97 103  CO2  --   < > 28 29 27   GLUCOSE  --   < > 99 98 96  BUN  --   < > 16 22 23   CREATININE  --   < > 1.15* 1.24* 1.22*  CALCIUM  --   < > 8.9 9.3 9.1  MG 2.1  --   --   --   --   PHOS 3.0  --   --   --   --   < > = values in this interval not displayed. Liver Function Tests:  Recent Labs  02/01/17 1450 02/02/17 0511 02/16/17 1457  AST 24 21 23   ALT 16 16 15   ALKPHOS 60 50 59  BILITOT 1.1 0.7 0.8  PROT 5.9* 5.7* 5.8*  ALBUMIN 3.1* 2.9* 3.5*   No results for input(s): LIPASE, AMYLASE in the last 8760 hours. No results for input(s): AMMONIA in the last 8760 hours. CBC:  Recent Labs  02/01/17 1450 02/02/17 0511 02/03/17 0656 02/16/17 1457  WBC 9.2 5.7 11.8* 9.1  NEUTROABS 5.8  --   --  6,188  HGB 13.3 12.6 12.3 13.4  HCT 42.8 40.5 39.4 42.1  MCV 90.5 89.4 90.0 88.1  PLT 206 153 182 239   Lipid Panel: No results for input(s): CHOL, HDL, LDLCALC, TRIG, CHOLHDL, LDLDIRECT in the last 8760 hours. TSH: No results for input(s): TSH in the last 8760 hours. A1C: Lab Results  Component Value Date   HGBA1C 5.6 04/12/2015     Assessment/Plan 1. Anxiety - DULoxetine (CYMBALTA) 30 MG capsule; Take 1  capsule (30 mg total) by mouth daily.  Dispense: 30 capsule; Refill: 1  2. Dementia with behavioral disturbance, unspecified dementia type Ongoing progression making it harder on family to care for her at home. She has her brother who does her medication and helps with care but family feels like she could benefit from further assistance. Interested in [place] and brother plans to follow up with their SW. Cont current regimen.   3. Gastroesophageal reflux disease, esophagitis presence not specified Poor diet, information provided on diet and to cont on protonix   5. Primary osteoarthritis involving multiple  joints conts with PT and following with ortho. ROM unchanged, states pain was worse this morning after yogo instructor bumped into her but after taking medication pain has improved. May use tylenol 650 mg by mouth every 6 hours as needed pain.   6. Chronic diastolic heart failure (HCC) Stable, swelling to LE unchanged, discussed need to stay hydrated but not over due to it due to CHF. Cont on current regimen.   7. CKD (chronic kidney disease), stage III -encouraged proper hydration,6-8 eight glass of water a day, to avoid NSAIDS.   Next appt: 4 weeks for anxiety  Mieczyslaw Stamas K. Harle Battiest  [facility] [phone] 8 am - 5 pm) [phone] (after hours)

## 2017-07-07 ENCOUNTER — Other Ambulatory Visit: Payer: Self-pay | Admitting: Nurse Practitioner

## 2017-07-07 ENCOUNTER — Other Ambulatory Visit: Payer: Self-pay | Admitting: Adult Health

## 2017-07-08 DIAGNOSIS — M1711 Unilateral primary osteoarthritis, right knee: Secondary | ICD-10-CM | POA: Diagnosis not present

## 2017-07-08 DIAGNOSIS — F0390 Unspecified dementia without behavioral disturbance: Secondary | ICD-10-CM | POA: Insufficient documentation

## 2017-07-08 DIAGNOSIS — F419 Anxiety disorder, unspecified: Secondary | ICD-10-CM

## 2017-07-08 DIAGNOSIS — F0391 Unspecified dementia with behavioral disturbance: Secondary | ICD-10-CM | POA: Insufficient documentation

## 2017-07-08 DIAGNOSIS — M159 Polyosteoarthritis, unspecified: Secondary | ICD-10-CM

## 2017-07-08 DIAGNOSIS — M8949 Other hypertrophic osteoarthropathy, multiple sites: Secondary | ICD-10-CM

## 2017-07-08 DIAGNOSIS — K219 Gastro-esophageal reflux disease without esophagitis: Secondary | ICD-10-CM | POA: Insufficient documentation

## 2017-07-08 DIAGNOSIS — M150 Primary generalized (osteo)arthritis: Secondary | ICD-10-CM | POA: Insufficient documentation

## 2017-07-08 DIAGNOSIS — F03918 Unspecified dementia, unspecified severity, with other behavioral disturbance: Secondary | ICD-10-CM

## 2017-07-08 HISTORY — DX: Anxiety disorder, unspecified: F41.9

## 2017-07-08 HISTORY — DX: Gastro-esophageal reflux disease without esophagitis: K21.9

## 2017-07-08 HISTORY — DX: Unspecified dementia with behavioral disturbance: F03.91

## 2017-07-08 HISTORY — DX: Primary generalized (osteo)arthritis: M15.0

## 2017-07-08 HISTORY — DX: Unspecified dementia, unspecified severity, with other behavioral disturbance: F03.918

## 2017-07-08 HISTORY — DX: Other hypertrophic osteoarthropathy, multiple sites: M89.49

## 2017-07-08 HISTORY — DX: Polyosteoarthritis, unspecified: M15.9

## 2017-07-13 ENCOUNTER — Ambulatory Visit: Payer: Medicare Other | Admitting: Allergy & Immunology

## 2017-07-14 ENCOUNTER — Other Ambulatory Visit: Payer: Self-pay | Admitting: Internal Medicine

## 2017-07-16 ENCOUNTER — Telehealth: Payer: Self-pay | Admitting: Allergy & Immunology

## 2017-07-16 NOTE — Telephone Encounter (Signed)
Called brother advised that in 05/2017 we sent in additional 5 refills for patient and he should call pharmacy to request. Brother verbalized understanding

## 2017-07-16 NOTE — Telephone Encounter (Signed)
Pt husband called for her needing to get refills on astelin and flonase called to [facility] on [address].  [phone].

## 2017-07-20 ENCOUNTER — Ambulatory Visit (INDEPENDENT_AMBULATORY_CARE_PROVIDER_SITE_OTHER): Payer: Medicare Other | Admitting: Internal Medicine

## 2017-07-20 ENCOUNTER — Encounter: Payer: Self-pay | Admitting: Internal Medicine

## 2017-07-20 VITALS — BP 132/68 | HR 76 | Ht 63.0 in | Wt 192.8 lb

## 2017-07-20 DIAGNOSIS — I10 Essential (primary) hypertension: Secondary | ICD-10-CM

## 2017-07-20 DIAGNOSIS — I6523 Occlusion and stenosis of bilateral carotid arteries: Secondary | ICD-10-CM | POA: Diagnosis not present

## 2017-07-20 DIAGNOSIS — I5032 Chronic diastolic (congestive) heart failure: Secondary | ICD-10-CM | POA: Diagnosis not present

## 2017-07-20 NOTE — Patient Instructions (Addendum)
Your physician recommends that you continue on your current medications as directed. Please refer to the Current Medication list given to you today.  Your physician recommends that you return for lab work today (BMET, BNP)  Your physician recommends that you schedule a follow up appointment in December, 2018 with Dr. Harrington Challenger.   See below.

## 2017-07-20 NOTE — Progress Notes (Signed)
Cardiology Office Note   Date:  07/20/2017   ID:  Virginia [name], DOB [birth year], MRN [id number]  PCP:  Lauree Chandler, NP  Cardiologist:   Dorris Carnes, MD   Pt presents for f/u of HTN, chronic systolic CHF   History of Present Illness: Virginia [name] is a 81 y.o. female with a history of HTN, HL, CV dz, chronic diastlic CHF  Dementia.  I saw her in June 2017    [place] in March for hypoxia  Seen by Jonelle Sidle in May 2018  Had evid of fluid overload on exam Since seen pt says her breatihng is rel stable  Does give out when she does things  Wt rel stable Has some am nausea  Goes away  ? Due to coffee  Current Meds  Medication Sig  . albuterol (PROVENTIL) (2.5 MG/3ML) 0.083% nebulizer solution Take 6 mLs (5 mg total) by nebulization every 4 (four) hours as needed for wheezing or shortness of breath.  . ARTIFICIAL TEAR OP Place 2 drops into both eyes daily.   Marland Kitchen aspirin EC 81 MG tablet Take 81 mg by mouth every evening.  Marland Kitchen atorvastatin (LIPITOR) 20 MG tablet take 1 tablet by mouth once daily  . azelastine (ASTELIN) 0.1 % nasal spray Place 2 sprays into both nostrils 2 (two) times daily. Use in each nostril as directed  . BREO ELLIPTA 100-25 MCG/INH AEPB inhale 1 puff INTO THE LUNGS daily  . BYSTOLIC 5 MG tablet take 1 tablet by mouth once daily  . Calcium Carbonate-Vitamin D (CALCIUM-D PO) Take 1 tablet by mouth every evening.  . diclofenac sodium (VOLTAREN) 1 % GEL Apply 1 application topically 2 (two) times daily as needed (shoulder and knee pain).  Marland Kitchen donepezil (ARICEPT) 10 MG tablet Take 10 mg by mouth daily.   . fluticasone (FLONASE) 50 MCG/ACT nasal spray Place 2 sprays into both nostrils daily.  . furosemide (LASIX) 20 MG tablet Take 1 tablet (20 mg total) by mouth daily.  Marland Kitchen ipratropium-albuterol (DUONEB) 0.5-2.5 (3) MG/3ML SOLN inhale contents of 1 vial in nebulizer every 6 hours (Patient taking differently: inhale contents of 1 vial in nebulizer every 6 hours as needed)  .  memantine (NAMENDA) 10 MG tablet Take 1 tablet (10 mg total) by mouth 2 (two) times daily.  . Multiple Vitamins-Minerals (PRESERVISION AREDS 2) CAPS Take 1 capsule by mouth 2 (two) times daily.   . ondansetron (ZOFRAN ODT) 4 MG disintegrating tablet Take 1 tablet (4 mg total) by mouth every 8 (eight) hours as needed for nausea or vomiting.  . pantoprazole (PROTONIX) 40 MG tablet Take 1 tablet (40 mg total) by mouth daily.  . ranitidine (ZANTAC) 150 MG tablet Take 150 mg by mouth daily.  Marland Kitchen VAYACOG 100-19.5-6.5 MG CAPS take 1 capsule by mouth once daily     Allergies:   Lidocaine and Other   Past Medical History:  Diagnosis Date  . Arthritis   . Carotid artery occlusion   . CKD (chronic kidney disease)   . COPD (chronic obstructive pulmonary disease) ([place])   . Diverticulosis   . Fall at home Sept. 2013  . Hyperlipidemia   . Hypertension   . Internal hemorrhoid   . Memory disorder 10/24/2014    Past Surgical History:  Procedure Laterality Date  . APPENDECTOMY    . BREAST REDUCTION SURGERY    . CAROTID ENDARTERECTOMY     left CEA  . CATARACT EXTRACTION Bilateral   . COLONOSCOPY  2015  .  EYE SURGERY     cataracts removed, bilaterally   . skin cancer removal    . TONSILLECTOMY       Social History:  The patient  reports that she quit smoking about 22 years ago. Her smoking use included Cigarettes. She has a 5.00 pack-year smoking history. She has never used smokeless tobacco. She reports that she does not drink alcohol or use drugs.   Family History:  The patient's family history includes Cancer (age of onset: 45) in her mother; Cancer (age of onset: 87) in her maternal aunt; Dementia in her brother; Diabetes in her maternal aunt and maternal grandmother; Heart attack in her father; Heart disease in her father; Heart failure in her maternal grandmother; Hypertension in her father.    ROS:  Please see the history of present illness. All other systems are reviewed and  Negative  to the above problem except as noted.    PHYSICAL EXAM: VS:  BP 132/68   Pulse 76   Ht 5\' 3"  (1.6 m)   Wt 192 lb 12.8 oz (87.5 kg)   SpO2 95%   BMI 34.15 kg/m   GEN: Well nourished, well developed, in no acute distress  HEENT: normal  Neck: JVP increased  , carotid bruits, or masses Cardiac: RRR; no murmurs, rubs, or gallops,Tr   edema  Respiratory:  clear to auscultation bilaterally, normal work of breathing GI: soft, nontender, nondistended, + BS  No hepatomegaly  MS: no deformity Moving all extremities   Skin: warm and dry, no rash Neuro:  Strength and sensation are intact Psych: euthymic mood, full affect   EKG:  EKG is not  ordered today.   Lipid Panel    Component Value Date/Time   CHOL 167 08/01/2015 0857   TRIG 119.0 08/01/2015 0857   HDL 61.40 08/01/2015 0857   CHOLHDL 3 08/01/2015 0857   VLDL 23.8 08/01/2015 0857   LDLCALC 81 08/01/2015 0857   LDLDIRECT 142.0 05/24/2015 1108      Wt Readings from Last 3 Encounters:  07/20/17 192 lb 12.8 oz (87.5 kg)  07/06/17 195 lb 3.2 oz (88.5 kg)  06/23/17 192 lb (87.1 kg)      ASSESSMENT AND PLAN:  1Acute on chronic diastolic CHF  VOlum status if upt a little  Will recheck labs  Keep on medicines for now.  2  HTN   Adequately controlled     Current medicines are reviewed at length with the patient today.  The patient does not have concerns regarding medicines.  Signed, Dorris Carnes, MD  07/20/2017 2:02 PM    [facility] [address], [place], [place]  [address] Phone: [phone]; Fax: [phone]

## 2017-07-21 ENCOUNTER — Telehealth: Payer: Self-pay | Admitting: *Deleted

## 2017-07-21 LAB — BASIC METABOLIC PANEL
BUN / CREAT RATIO: 17 (ref 12–28)
BUN: 20 mg/dL (ref 8–27)
CHLORIDE: 98 mmol/L (ref 96–106)
CO2: 28 mmol/L (ref 20–29)
Calcium: 8.6 mg/dL — ABNORMAL LOW (ref 8.7–10.3)
Creatinine, Ser: 1.18 mg/dL — ABNORMAL HIGH (ref 0.57–1.00)
GFR calc non Af Amer: 44 mL/min/{1.73_m2} — ABNORMAL LOW (ref >59)
GFR, EST AFRICAN AMERICAN: 51 mL/min/{1.73_m2} — AB (ref >59)
GLUCOSE: 106 mg/dL — AB (ref 65–99)
POTASSIUM: 3.8 mmol/L (ref 3.5–5.2)
SODIUM: 143 mmol/L (ref 134–144)

## 2017-07-21 LAB — PRO B NATRIURETIC PEPTIDE: NT-PRO BNP: 544 pg/mL (ref 0–738)

## 2017-07-22 DIAGNOSIS — M1711 Unilateral primary osteoarthritis, right knee: Secondary | ICD-10-CM | POA: Diagnosis not present

## 2017-07-25 ENCOUNTER — Other Ambulatory Visit: Payer: Self-pay | Admitting: Neurology

## 2017-07-27 ENCOUNTER — Other Ambulatory Visit: Payer: Self-pay | Admitting: *Deleted

## 2017-07-27 ENCOUNTER — Ambulatory Visit (INDEPENDENT_AMBULATORY_CARE_PROVIDER_SITE_OTHER): Payer: Medicare Other | Admitting: Allergy & Immunology

## 2017-07-27 ENCOUNTER — Encounter: Payer: Self-pay | Admitting: Allergy & Immunology

## 2017-07-27 VITALS — BP 132/76 | HR 80 | Resp 16

## 2017-07-27 DIAGNOSIS — J310 Chronic rhinitis: Secondary | ICD-10-CM

## 2017-07-27 DIAGNOSIS — T781XXD Other adverse food reactions, not elsewhere classified, subsequent encounter: Secondary | ICD-10-CM

## 2017-07-27 DIAGNOSIS — J449 Chronic obstructive pulmonary disease, unspecified: Secondary | ICD-10-CM | POA: Diagnosis not present

## 2017-07-27 NOTE — Progress Notes (Signed)
FOLLOW UP  Date of Service/Encounter:  07/27/17   Assessment:   Chronic obstructive pulmonary disease  Non-allergic rhinitis  Adverse food reaction - multiple foods with negative testing  Plan/Recommendations:   1. Chronic obstructive pulmonary disease  - Lung testing looked stable today. - Although there was no improvement with her numbers, she does report that she is feeling symptomatically better.  - She is unsure of the copay required for the Breo, but I did offer Symbicort in lieu of Memory Dance is this was an issue.  - I like the Breo due to the improved compliance.  - Daily controller medication(s): Breo 100/25 one puff once daily - Rescue medications: albuterol 4 puffs every 4-6 hours as needed, albuterol nebulizer one vial puffs every 4-6 hours as needed or DuoNeb nebulizer one vial every 4-6 hours as needed - Asthma control goals:  * Full participation in all desired activities (may need albuterol before activity) * Albuterol use two time or less a week on average (not counting use with activity) * Cough interfering with sleep two time or less a month * Oral steroids no more than once a year * No hospitalizations - Her continued SOB with physical activity could be secondary to her heart condition (sees Cardiology routinely).  2. Non-allergic rhinitis - Continue Xyzal (levocetirizine) 5mg  tablet once daily and Dymista (fluticasone/azelastine) two sprays per nostril 1-2 times daily as needed - You can use an extra dose of the antihistamine, if needed, for breakthrough symptoms.  - Consider nasal saline rinses 1-2 times daily to remove allergens from the nasal cavities as well as help with mucous clearance (this is especially helpful to do before the nasal sprays are given).  3. Adverse food reaction - with negative testing - Continue to eat all foods since testing has been negative.  - There is no need for an EpiPen at this time.   4. Return in about 4 months (around  11/26/2017).     Subjective:   Virginia [name] is a 81 y.o. female presenting today for follow up of  Chief Complaint  Patient presents with  . Follow-up    Virginia [name] has a history of the following: Patient Active Problem List   Diagnosis Date Noted  . Primary osteoarthritis involving multiple joints 07/08/2017  . Dementia with behavioral disturbance 07/08/2017  . Anxiety 07/08/2017  . Gastroesophageal reflux disease 07/08/2017  . Hypotension 02/01/2017  . CKD (chronic kidney disease), stage III 02/01/2017  . Cough 01/31/2016  . Vitamin D deficiency 02/15/2015  . Hyperlipidemia 01/18/2015  . COPD GOLD IV 12/30/2014  . Chronic diastolic heart failure ([place]) 12/08/2014  . Dyspnea   . Congestive heart disease ([place])   . Hyponatremia 12/02/2014  . Essential hypertension 12/02/2014  . Nausea and vomiting 11/30/2014  . Memory disorder 10/24/2014  . Carotid stenosis 10/19/2012    History obtained from: chart review and patient.  Virginia [name]'s Primary Care Provider is Lauree Chandler, NP.     Virginia [name] is a 81 y.o. female presenting for a follow up visit. She was last seen in July 2018 as a new patient with a chief complaint of allergies and shortness of breath. She has a history of COPD and I recommended that she start Breo one puff once daily. I was optimistic that this once daily medication would be helpful to improve her compliance. We did do testing that was negative to the entire panel. We stopped the Flonase, Atrovent, and Zyrtec, instead starting Xyzal  and Dymista. She also reported a history of food allergies, but testing to the most common foods as well as selected fruits was all negative. We did obtain lab work that showed a negative environmental panel as well as negative lab testing for the apple, banana, peach, watermelon, pineapple, and coffee. She did have spirometry at the last visit that showed marked improvement with albuterol treatment.   Since the last visit,  she has done well. She appears perkier today. [name]'s asthma has been well controlled. She has not required rescue medication, experienced nocturnal awakenings due to lower respiratory symptoms, nor have activities of daily living been limited. She has required no Emergency Department or Urgent Care visits for her asthma. She has required zero courses of systemic steroids for asthma exacerbations since the last visit. ACT score today is 17, indicating subpar asthma symptom control.   Rhinitis is well controlled with the nasal spray and antihistamine. She denies having any complaints today. She continues to have stomach pain with coffee although this seems to be better with the addition of milk to her coffee.   Otherwise, there have been no changes to her past medical history, surgical history, family history, or social history.  Spirometry from the July 2018 visit:      Review of Systems: a 14-point review of systems is pertinent for what is mentioned in HPI.  Otherwise, all other systems were negative. Constitutional: negative other than that listed in the HPI Eyes: negative other than that listed in the HPI Ears, nose, mouth, throat, and face: negative other than that listed in the HPI Respiratory: negative other than that listed in the HPI Cardiovascular: negative other than that listed in the HPI Gastrointestinal: negative other than that listed in the HPI Genitourinary: negative other than that listed in the HPI Integument: negative other than that listed in the HPI Hematologic: negative other than that listed in the HPI Musculoskeletal: negative other than that listed in the HPI Neurological: negative other than that listed in the HPI Allergy/Immunologic: negative other than that listed in the HPI    Objective:   Blood pressure 132/76, pulse 80, resp. rate 16. There is no height or weight on file to calculate BMI.   Physical Exam:  General: Alert, interactive, in no acute  distress. Pleasant.  Eyes: No conjunctival injection present on the right, No conjunctival injection present on the left, PERRL bilaterally, No discharge on the right, No discharge on the left and No Horner-Trantas dots present Ears: Right TM pearly gray with normal light reflex, Left TM pearly gray with normal light reflex, Right TM intact without perforation and Left TM intact without perforation.  Nose/Throat: External nose within normal limits, nasal crease present and septum midline, turbinates edematous with clear discharge, post-pharynx erythematous with cobblestoning in the posterior oropharynx. Tonsils 2+ without exudates Neck: Supple without thyromegaly. Lungs: Clear to auscultation without wheezing, rhonchi or rales. No increased work of breathing. CV: Normal S1/S2, no murmurs. Capillary refill <2 seconds.  Skin: Warm and dry, without lesions or rashes. Neuro:   Grossly intact. No focal deficits appreciated. Responsive to questions.   Diagnostic studies: none  Spirometry: results abnormal (FEV1: 0.78/40%, FVC: 1.26/49%, FEV1/FVC: 62%).    Spirometry consistent with mixed obstructive and restrictive disease.   Allergy Studies: none      Salvatore Marvel, MD [facility]

## 2017-07-27 NOTE — Patient Instructions (Addendum)
1. Chronic obstructive pulmonary disease - Lung testing looked stable today. - We can substitute Symbicort for Breo if the Memory Dance is too expensive.  - Daily controller medication(s): Breo 100/25 one puff once daily - Rescue medications: albuterol 4 puffs every 4-6 hours as needed, albuterol nebulizer one vial puffs every 4-6 hours as needed or DuoNeb nebulizer one vial every 4-6 hours as needed - Asthma control goals:  * Full participation in all desired activities (may need albuterol before activity) * Albuterol use two time or less a week on average (not counting use with activity) * Cough interfering with sleep two time or less a month * Oral steroids no more than once a year * No hospitalizations  2. Non-allergic rhinitis - Continue Xyzal (levocetirizine) 5mg  tablet once daily and Dymista (fluticasone/azelastine) two sprays per nostril 1-2 times daily as needed - You can use an extra dose of the antihistamine, if needed, for breakthrough symptoms.  - Consider nasal saline rinses 1-2 times daily to remove allergens from the nasal cavities as well as help with mucous clearance (this is especially helpful to do before the nasal sprays are given).  3. Adverse food reaction - with negative testing - Continue to eat all foods since testing has been negative.  - There is no need for an EpiPen at this time.   4. Return in about 4 months (around 11/26/2017). In case Virginia [name] has more questions, feel free to have him email me at [email or web address].  Please inform us of any Emergency Department visits, hospitalizations, or changes in symptoms. Call us before going to the ED for breathing or allergy symptoms since we might be able to fit you in for a sick visit. Feel free to contact us anytime with any questions, problems, or concerns.  It was a pleasure to see you and your family again today!   Websites that have reliable patient information: 1. American Academy of Asthma, Allergy, and  Immunology: www.aaaai.org 2. Food Allergy Research and Education (FARE): foodallergy.org 3. Mothers of Asthmatics: http://www.asthmacommunitynetwork.org 4. American College of Allergy, Asthma, and Immunology: www.acaai.org

## 2017-07-28 NOTE — Addendum Note (Signed)
Addended by: Herbie Drape on: 07/28/2017 02:36 PM   Modules accepted: Orders

## 2017-07-31 DIAGNOSIS — M1711 Unilateral primary osteoarthritis, right knee: Secondary | ICD-10-CM | POA: Diagnosis not present

## 2017-07-31 DIAGNOSIS — M19011 Primary osteoarthritis, right shoulder: Secondary | ICD-10-CM | POA: Diagnosis not present

## 2017-07-31 DIAGNOSIS — M7051 Other bursitis of knee, right knee: Secondary | ICD-10-CM | POA: Diagnosis not present

## 2017-07-31 NOTE — Telephone Encounter (Signed)
Notes recorded by Richmond Campbell, LPN on 2/33/6122 at 4:49 AM EDT Pt's brother aware of lab results copy mailed at request of pt's brother .Adonis Housekeeper

## 2017-08-04 ENCOUNTER — Telehealth: Payer: Self-pay | Admitting: *Deleted

## 2017-08-04 NOTE — Telephone Encounter (Signed)
Left message for Hughes Better to call back to confirm how much lasix patient is taking every day.  I only see lasix 20 mg ordered.

## 2017-08-04 NOTE — Telephone Encounter (Signed)
Patients brother called and stated that patient needs an rx for furosemide 40 mg qd. Pharmacy will only refill furosemide 20 mg as this is what was ordered in March when the patient was in the hospital. Please advise. Thanks, MI

## 2017-08-06 ENCOUNTER — Encounter: Payer: Self-pay | Admitting: Nurse Practitioner

## 2017-08-06 ENCOUNTER — Ambulatory Visit (INDEPENDENT_AMBULATORY_CARE_PROVIDER_SITE_OTHER): Payer: Medicare Other | Admitting: Nurse Practitioner

## 2017-08-06 VITALS — BP 122/78 | HR 71 | Temp 98.6°F | Resp 18 | Ht 63.0 in | Wt 190.6 lb

## 2017-08-06 DIAGNOSIS — F0391 Unspecified dementia with behavioral disturbance: Secondary | ICD-10-CM

## 2017-08-06 DIAGNOSIS — E559 Vitamin D deficiency, unspecified: Secondary | ICD-10-CM

## 2017-08-06 DIAGNOSIS — F419 Anxiety disorder, unspecified: Secondary | ICD-10-CM

## 2017-08-06 DIAGNOSIS — I6523 Occlusion and stenosis of bilateral carotid arteries: Secondary | ICD-10-CM

## 2017-08-06 MED ORDER — CITALOPRAM HYDROBROMIDE 10 MG PO TABS: 10.0000 mg | ORAL_TABLET | Freq: Every day | ORAL | 0 refills | Status: DC

## 2017-08-06 NOTE — Progress Notes (Signed)
Careteam: Patient Care Team: Lauree Chandler, NP as PCP - General (Nurse Practitioner) Fay Records, MD as Consulting Physician (Cardiology) Tanda Rockers, MD as Consulting Physician (Pulmonary Disease)  Advanced Directive information Does Patient Have a Medical Advance Directive?: Yes, Type of Advance Directive: [place];Living will  Allergies  Allergen Reactions  . Lidocaine Anaphylaxis, Swelling, Rash and Other (See Comments)    Any of the " [name] "  . Other Other (See Comments)    All drugs that end in "cane'-  novacane etc. Daughter states patient almost died when she had it when she was born    Chief Complaint  Patient presents with  . Follow-up    Pt is being seen for a 4 week follow up on mood and anxiety.   . Other    Brother is in room.      HPI: Patient is a 81 y.o. female seen in the office today to follow up anxiety.  Started on duloxetine at last visit due to increase anxiety.  Brother reports she did not tolerate it. Slept for 21 hours after she took it at one time. Other time made her more confused. Family was hoping it would help with her fibromyalgia as well as her anxiety.  pts son had moved in and that was causing her increase anxiety.  Also did not tolerate fluoxetine in the past.  Family member in the past did not do well on zoloft.  Apparently a lot of family issues causing increase in anxiety however daughter reports she is a highly anxious person anyway.   Hx of Vit d def, taking Calcium and vit d but quit taking her other Vit D complex  Review of Systems:  Review of Systems  Unable to perform ROS: Dementia    Past Medical History:  Diagnosis Date  . Arthritis   . Carotid artery occlusion   . CKD (chronic kidney disease)   . COPD (chronic obstructive pulmonary disease) ([place])   . Diverticulosis   . Fall at home Sept. 2013  . Hyperlipidemia   . Hypertension   . Internal hemorrhoid   . Memory disorder  10/24/2014   Past Surgical History:  Procedure Laterality Date  . APPENDECTOMY    . BREAST REDUCTION SURGERY    . CAROTID ENDARTERECTOMY     left CEA  . CATARACT EXTRACTION Bilateral   . COLONOSCOPY  2015  . EYE SURGERY     cataracts removed, bilaterally   . skin cancer removal    . TONSILLECTOMY     Social History:   reports that she quit smoking about 22 years ago. Her smoking use included Cigarettes. She has a 5.00 pack-year smoking history. She has never used smokeless tobacco. She reports that she does not drink alcohol or use drugs.  Family History  Problem Relation Age of Onset  . Heart disease Father        Before age 60  . Hypertension Father   . Heart attack Father   . Cancer Mother 21       Brain  . Dementia Brother   . Cancer Maternal Aunt 61       breast cancer  . Diabetes Maternal Aunt   . Heart failure Maternal Grandmother   . Diabetes Maternal Grandmother   . Stroke Neg Hx     Medications: Patient's Medications  New Prescriptions   No medications on file  Previous Medications   ALBUTEROL (PROVENTIL) (2.5  MG/3ML) 0.083% NEBULIZER SOLUTION    Take 6 mLs (5 mg total) by nebulization every 4 (four) hours as needed for wheezing or shortness of breath.   ARTIFICIAL TEAR OP    Place 2 drops into both eyes daily.    ASPIRIN EC 81 MG TABLET    Take 81 mg by mouth every evening.   ATORVASTATIN (LIPITOR) 20 MG TABLET    take 1 tablet by mouth once daily   AZELASTINE (ASTELIN) 0.1 % NASAL SPRAY    Place 2 sprays into both nostrils 2 (two) times daily. Use in each nostril as directed   BREO ELLIPTA 100-25 MCG/INH AEPB    inhale 1 puff INTO THE LUNGS daily   BYSTOLIC 5 MG TABLET    take 1 tablet by mouth once daily   CALCIUM CARBONATE-VITAMIN D (CALCIUM-D PO)    Take 1 tablet by mouth every evening.   DICLOFENAC SODIUM (VOLTAREN) 1 % GEL    Apply 1 application topically 2 (two) times daily as needed (shoulder and knee pain).   DONEPEZIL (ARICEPT) 10 MG TABLET     Take 10 mg by mouth daily.    FLUTICASONE (FLONASE) 50 MCG/ACT NASAL SPRAY    Place 2 sprays into both nostrils daily.   FUROSEMIDE (LASIX) 20 MG TABLET    Take 1 tablet (20 mg total) by mouth daily.   IPRATROPIUM-ALBUTEROL (DUONEB) 0.5-2.5 (3) MG/3ML SOLN    inhale contents of 1 vial in nebulizer every 6 hours   MEMANTINE (NAMENDA) 10 MG TABLET    Take 1 tablet (10 mg total) by mouth 2 (two) times daily.   MULTIPLE VITAMINS-MINERALS (PRESERVISION AREDS 2) CAPS    Take 1 capsule by mouth 2 (two) times daily.    ONDANSETRON (ZOFRAN ODT) 4 MG DISINTEGRATING TABLET    Take 1 tablet (4 mg total) by mouth every 8 (eight) hours as needed for nausea or vomiting.   PANTOPRAZOLE (PROTONIX) 40 MG TABLET    Take 1 tablet (40 mg total) by mouth daily.   RANITIDINE (ZANTAC) 150 MG TABLET    Take 150 mg by mouth daily.   VAYACOG 100-19.5-6.5 MG CAPS    take 1 capsule by mouth once daily  Modified Medications   No medications on file  Discontinued Medications   No medications on file     Physical Exam:  Vitals:   08/06/17 1441  BP: 122/78  Pulse: 71  Resp: 18  Temp: 98.6 F (37 C)  TempSrc: Oral  SpO2: 96%  Weight: 190 lb 9.6 oz (86.5 kg)  Height: 5\' 3"  (1.6 m)   Body mass index is 33.76 kg/m.  Physical Exam  Constitutional: She appears well-developed and well-nourished.  HENT:  Head: Normocephalic and atraumatic.  Nose: Nose normal.  Mouth/Throat: Oropharynx is clear and moist. No oropharyngeal exudate.  Eyes: Pupils are equal, round, and reactive to light. Conjunctivae and EOM are normal.  Neck: Normal range of motion. Neck supple.  Cardiovascular: Normal rate, regular rhythm and normal heart sounds.   Pulmonary/Chest: Effort normal. She has no wheezes.  Abdominal: Soft. Bowel sounds are normal. She exhibits no distension.  Musculoskeletal: She exhibits edema (1+ bilaterally).  varicosities to lower extremities  Neurological: She is alert.  Skin: Skin is warm and dry.    Psychiatric: Cognition and memory are impaired. She exhibits abnormal remote memory.    Labs reviewed: Basic Metabolic Panel:  Recent Labs  02/01/17 1857  04/15/17 1529 04/23/17 1534 07/20/17 1421  NA  --   < >  142 144 143  K  --   < > 5.0 5.0 3.8  CL  --   < > 97 103 98  CO2  --   < > 29 27 28   GLUCOSE  --   < > 98 96 106*  BUN  --   < > 22 23 20   CREATININE  --   < > 1.24* 1.22* 1.18*  CALCIUM  --   < > 9.3 9.1 8.6*  MG 2.1  --   --   --   --   PHOS 3.0  --   --   --   --   < > = values in this interval not displayed. Liver Function Tests:  Recent Labs  02/01/17 1450 02/02/17 0511 02/16/17 1457  AST 24 21 23   ALT 16 16 15   ALKPHOS 60 50 59  BILITOT 1.1 0.7 0.8  PROT 5.9* 5.7* 5.8*  ALBUMIN 3.1* 2.9* 3.5*   No results for input(s): LIPASE, AMYLASE in the last 8760 hours. No results for input(s): AMMONIA in the last 8760 hours. CBC:  Recent Labs  02/01/17 1450 02/02/17 0511 02/03/17 0656 02/16/17 1457  WBC 9.2 5.7 11.8* 9.1  NEUTROABS 5.8  --   --  6,188  HGB 13.3 12.6 12.3 13.4  HCT 42.8 40.5 39.4 42.1  MCV 90.5 89.4 90.0 88.1  PLT 206 153 182 239   Lipid Panel: No results for input(s): CHOL, HDL, LDLCALC, TRIG, CHOLHDL, LDLDIRECT in the last 8760 hours. TSH: No results for input(s): TSH in the last 8760 hours. A1C: Lab Results  Component Value Date   HGBA1C 5.6 04/12/2015     Assessment/Plan 1. Anxiety -did not tolerate cymbalta - citalopram (CELEXA) 10 MG tablet; Take 1 tablet (10 mg total) by mouth daily.  Dispense: 90 tablet; Refill: 0 -will get TSH  2. Dementia with behavioral disturbance, unspecified dementia type Progressive decline. Discussed with family about continuum of care communities.   3. Vitamin D deficiency Not taking supplement, will follow up VIt D  Next appt: 6 weeks, sooner if needed Velicia Dejager K. Harle Battiest  [facility] [phone] 8 am - 5 pm) [phone] (after  hours)

## 2017-08-06 NOTE — Patient Instructions (Addendum)
Start Celexa 10 mg 1/2 tablet for 2 weeks then increase to 1 tablet if tolerates  Follow up in 6 weeks for mood Make appt for flu shot in 4 weeks  Recommended to take caltrate with D 600/400 twice a day.   If she does not cont on the Celexa to make a follow up appt in 3 months.

## 2017-08-06 NOTE — Telephone Encounter (Signed)
Called Mr. Adcock's number back [phone], before I could identify myself a man stated "this is not a good time, please do not call back."  Called [phone] and left a message for someone to call back.

## 2017-08-07 NOTE — Telephone Encounter (Signed)
Follow up     Returning a call to the nurse Mr Virginia [name] stated that pt is taking 20mg  of furosemide.  Once upon a time, she had 40mg  tablets but had to cut them in half.  They do not want to cut pill in half anymore.

## 2017-08-10 MED ORDER — FUROSEMIDE 20 MG PO TABS: 20.0000 mg | ORAL_TABLET | Freq: Every day | ORAL | 3 refills | Status: DC

## 2017-08-10 NOTE — Telephone Encounter (Signed)
Reordered lasix 20 mg daily to [facility]. #90, 3 refills.

## 2017-08-12 ENCOUNTER — Other Ambulatory Visit: Payer: Self-pay | Admitting: Internal Medicine

## 2017-08-13 ENCOUNTER — Other Ambulatory Visit (INDEPENDENT_AMBULATORY_CARE_PROVIDER_SITE_OTHER): Payer: Medicare Other | Admitting: *Deleted

## 2017-08-13 ENCOUNTER — Other Ambulatory Visit: Payer: Self-pay | Admitting: *Deleted

## 2017-08-13 ENCOUNTER — Other Ambulatory Visit: Payer: Self-pay | Admitting: Allergy & Immunology

## 2017-08-13 DIAGNOSIS — E559 Vitamin D deficiency, unspecified: Secondary | ICD-10-CM

## 2017-08-13 DIAGNOSIS — F419 Anxiety disorder, unspecified: Secondary | ICD-10-CM | POA: Diagnosis not present

## 2017-08-13 MED ORDER — CARBINOXAMINE MALEATE ER 4 MG/5ML PO SUER: 7.5000 mL | Freq: Two times a day (BID) | ORAL | 5 refills | Status: DC | PRN

## 2017-08-13 NOTE — Telephone Encounter (Signed)
Patient's son stopped by and said his mother, Virginia [name], was given a sample of [place] and she liked it and would like a prescription sent in. [facility] on [address].

## 2017-08-13 NOTE — Telephone Encounter (Signed)
Called patient. I left message informing patient that we sent script for Virginia [name] to the [facility] aid per Dr. Ernst Bowler.

## 2017-08-14 ENCOUNTER — Telehealth: Payer: Self-pay

## 2017-08-14 LAB — TSH: TSH: 0.888 u[IU]/mL (ref 0.450–4.500)

## 2017-08-14 LAB — VITAMIN D 25 HYDROXY (VIT D DEFICIENCY, FRACTURES): Vit D, 25-Hydroxy: 19.4 ng/mL — ABNORMAL LOW (ref 30.0–100.0)

## 2017-08-14 MED ORDER — VITAMIN D (ERGOCALCIFEROL) 1.25 MG (50000 UNIT) PO CAPS: 50000.0000 [IU] | ORAL_CAPSULE | ORAL | 2 refills | Status: DC

## 2017-08-14 NOTE — Telephone Encounter (Signed)
Dicussed results with patient's brother. RX sent to pharmacy. Copy of labs mailed

## 2017-08-14 NOTE — Telephone Encounter (Signed)
-----   Message from Lauree Chandler, NP sent at 08/14/2017  8:43 AM EDT ----- Vit D is low, lets start back on the Vit D 50,000 units by mouth weekly for now  Thyroid is ok

## 2017-08-19 DIAGNOSIS — M19011 Primary osteoarthritis, right shoulder: Secondary | ICD-10-CM | POA: Diagnosis not present

## 2017-08-20 ENCOUNTER — Telehealth: Payer: Self-pay | Admitting: Allergy & Immunology

## 2017-08-20 NOTE — Telephone Encounter (Signed)
Received a fax stating that Virginia [name] ER 4mg /33ml is not covered by insurance. We can do a PA or change to a covered medication. Please advise.

## 2017-08-20 NOTE — Telephone Encounter (Signed)
That is not surprising... Let's do a PA. She has been on essentially every other antihistamine. In the meantime, we can provide samples.   Salvatore Marvel, MD [facility] [place]

## 2017-08-21 NOTE — Telephone Encounter (Signed)
PA submitted.

## 2017-08-24 NOTE — Telephone Encounter (Signed)
PA has been approved. I will scan a copy of the approval letter into the patient's chart

## 2017-08-24 NOTE — Telephone Encounter (Signed)
PA is pending.

## 2017-08-26 ENCOUNTER — Telehealth: Payer: Self-pay

## 2017-08-26 NOTE — Telephone Encounter (Signed)
Patient's daughter called to leave a message for Janett Billow.  I explained that Janett Billow is out of office and will return on 08/31/17. Izora Gala requested that message wait for Janett Billow on Monday for Janett Billow is familiar with their family situation and issues.  Patient previously had an order for oxygen, order was then discontinued (not sure by whom), all supplies were returned except a small oxygen tank.  Izora Gala was glad to have the small oxygen tank on hand if needed. Another family member (brother) took the small oxygen tank and will not return it.   Izora Gala would like to know if Janett Billow will give an order for patient to have oxygen on hand if needed. Izora Gala thinks previous oxygen supply was from [place].   Please advise

## 2017-08-28 DIAGNOSIS — L821 Other seborrheic keratosis: Secondary | ICD-10-CM | POA: Diagnosis not present

## 2017-08-28 DIAGNOSIS — D1801 Hemangioma of skin and subcutaneous tissue: Secondary | ICD-10-CM | POA: Diagnosis not present

## 2017-08-28 DIAGNOSIS — L820 Inflamed seborrheic keratosis: Secondary | ICD-10-CM | POA: Diagnosis not present

## 2017-08-28 DIAGNOSIS — Z85828 Personal history of other malignant neoplasm of skin: Secondary | ICD-10-CM | POA: Diagnosis not present

## 2017-08-30 NOTE — Telephone Encounter (Signed)
She will need to re-certify for oxygen to see if it is medically appropriate. Please explain to her that the patient has to meet certain measurements before oxygen will be given as this is considered a drug and has to be necessary. If she would like to make an appt to see if she meet this criteria we would be glad to see her in office for this.

## 2017-08-31 NOTE — Telephone Encounter (Signed)
Attempted to call Izora Gala but mail box was full. Will try to call her later.

## 2017-09-01 NOTE — Telephone Encounter (Signed)
I spoke with patient's brother and he scheduled an appointment for 09/08/17 to discuss need for home oxygen.

## 2017-09-01 NOTE — Telephone Encounter (Signed)
Attempted to call Virginia [name] again but still could not leave a message.

## 2017-09-08 ENCOUNTER — Encounter: Payer: Self-pay | Admitting: Nurse Practitioner

## 2017-09-08 ENCOUNTER — Ambulatory Visit (INDEPENDENT_AMBULATORY_CARE_PROVIDER_SITE_OTHER): Payer: Medicare Other | Admitting: Nurse Practitioner

## 2017-09-08 VITALS — BP 124/80 | HR 75 | Temp 98.7°F | Resp 17 | Ht 63.0 in | Wt 191.2 lb

## 2017-09-08 DIAGNOSIS — I6523 Occlusion and stenosis of bilateral carotid arteries: Secondary | ICD-10-CM

## 2017-09-08 DIAGNOSIS — J449 Chronic obstructive pulmonary disease, unspecified: Secondary | ICD-10-CM

## 2017-09-08 DIAGNOSIS — F419 Anxiety disorder, unspecified: Secondary | ICD-10-CM | POA: Diagnosis not present

## 2017-09-08 DIAGNOSIS — Z23 Encounter for immunization: Secondary | ICD-10-CM | POA: Diagnosis not present

## 2017-09-08 NOTE — Patient Instructions (Addendum)
restart celexa - take during the day  oxygen levels are great off Oxygen-- staying above 91% with ambulation

## 2017-09-08 NOTE — Progress Notes (Signed)
Careteam: Patient Care Team: Lauree Chandler, NP as PCP - General (Nurse Practitioner) Fay Records, MD as Consulting Physician (Cardiology) Tanda Rockers, MD as Consulting Physician (Pulmonary Disease)  Advanced Directive information Does Patient Have a Medical Advance Directive?: No  Allergies  Allergen Reactions  . Lidocaine Anaphylaxis, Swelling, Rash and Other (See Comments)    Any of the " [facility] "  . Other Other (See Comments)    All drugs that end in "cane'-  novacane etc. Daughter states patient almost died when she had it when she was born    Chief Complaint  Patient presents with  . Acute Visit    Pt is being seen to discuss the need for portable oxygen tank. Pt had side effects from celexa- pt and caregiver would like to discuss alternative.       HPI: Patient is a 81 y.o. female seen in the office today for evaluation of O2. Reports son took O2 and did not bring it back and was wondering (due to hx of needing O2) if she would requalify for this. No shortness of breath.   Started celexa- was on for 3 days and started having nightmare and was getting up more at night which was out of character so brother quit giving it to her but wonders if he should have continued.    Review of Systems:  Review of Systems  Unable to perform ROS: Dementia    Past Medical History:  Diagnosis Date  . Arthritis   . Carotid artery occlusion   . CKD (chronic kidney disease)   . COPD (chronic obstructive pulmonary disease) ([place])   . Diverticulosis   . Fall at home Sept. 2013  . Hyperlipidemia   . Hypertension   . Internal hemorrhoid   . Memory disorder 10/24/2014   Past Surgical History:  Procedure Laterality Date  . APPENDECTOMY    . BREAST REDUCTION SURGERY    . CAROTID ENDARTERECTOMY     left CEA  . CATARACT EXTRACTION Bilateral   . COLONOSCOPY  2015  . EYE SURGERY     cataracts removed, bilaterally   . skin cancer removal    . TONSILLECTOMY      Social History:   reports that she quit smoking about 22 years ago. Her smoking use included Cigarettes. She has a 5.00 pack-year smoking history. She has never used smokeless tobacco. She reports that she does not drink alcohol or use drugs.  Family History  Problem Relation Age of Onset  . Heart disease Father        Before age 79  . Hypertension Father   . Heart attack Father   . Cancer Mother 20       Brain  . Dementia Brother   . Cancer Maternal Aunt 61       breast cancer  . Diabetes Maternal Aunt   . Heart failure Maternal Grandmother   . Diabetes Maternal Grandmother   . Stroke Neg Hx     Medications: Patient's Medications  New Prescriptions   No medications on file  Previous Medications   ALBUTEROL (PROVENTIL) (2.5 MG/3ML) 0.083% NEBULIZER SOLUTION    Take 6 mLs (5 mg total) by nebulization every 4 (four) hours as needed for wheezing or shortness of breath.   ARTIFICIAL TEAR OP    Place 2 drops into both eyes daily.    ASPIRIN EC 81 MG TABLET    Take 81 mg by mouth every evening.  ATORVASTATIN (LIPITOR) 20 MG TABLET    take 1 tablet by mouth once daily   AZELASTINE (ASTELIN) 0.1 % NASAL SPRAY    Place 2 sprays into both nostrils 2 (two) times daily. Use in each nostril as directed   BREO ELLIPTA 100-25 MCG/INH AEPB    inhale 1 puff INTO THE LUNGS daily   BYSTOLIC 5 MG TABLET    take 1 tablet by mouth once daily   CALCIUM CARBONATE-VITAMIN D (CALCIUM-D PO)    Take 1 tablet by mouth every evening.   CARBINOXAMINE MALEATE ER [facility]) 4 MG/5ML SUER    Take 7.5 mLs by mouth every 12 (twelve) hours as needed.   CITALOPRAM (CELEXA) 10 MG TABLET    Take 1 tablet (10 mg total) by mouth daily.   DICLOFENAC SODIUM (VOLTAREN) 1 % GEL    Apply 1 application topically 2 (two) times daily as needed (shoulder and knee pain).   DONEPEZIL (ARICEPT) 10 MG TABLET    Take 10 mg by mouth daily.    FLUTICASONE (FLONASE) 50 MCG/ACT NASAL SPRAY    Place 2 sprays into both nostrils  daily.   FUROSEMIDE (LASIX) 20 MG TABLET    Take 1 tablet (20 mg total) by mouth daily.   IPRATROPIUM-ALBUTEROL (DUONEB) 0.5-2.5 (3) MG/3ML SOLN    inhale contents of 1 vial in nebulizer every 6 hours   MEMANTINE (NAMENDA) 10 MG TABLET    Take 1 tablet (10 mg total) by mouth 2 (two) times daily.   MULTIPLE VITAMINS-MINERALS (PRESERVISION AREDS 2) CAPS    Take 1 capsule by mouth 2 (two) times daily.    ONDANSETRON (ZOFRAN ODT) 4 MG DISINTEGRATING TABLET    Take 1 tablet (4 mg total) by mouth every 8 (eight) hours as needed for nausea or vomiting.   PANTOPRAZOLE (PROTONIX) 40 MG TABLET    Take 1 tablet (40 mg total) by mouth daily.   RANITIDINE (ZANTAC) 150 MG TABLET    Take 150 mg by mouth daily.   VAYACOG 100-19.5-6.5 MG CAPS    take 1 capsule by mouth once daily   VITAMIN D, ERGOCALCIFEROL, (DRISDOL) 50000 UNITS CAPS CAPSULE    Take 1 capsule (50,000 Units total) by mouth every 7 (seven) days.  Modified Medications   No medications on file  Discontinued Medications   No medications on file     Physical Exam:  Vitals:   09/08/17 1501 09/08/17 1502  BP: 124/80   Pulse: 75   Resp: 17   Temp: 98.7 F (37.1 C)   TempSrc: Oral   SpO2: 93% 91%  Weight: 191 lb 3.2 oz (86.7 kg)   Height: 5\' 3"  (1.6 m)    Body mass index is 33.87 kg/m.  Physical Exam  Constitutional: She appears well-developed and well-nourished.  HENT:  Head: Normocephalic and atraumatic.  Eyes: Pupils are equal, round, and reactive to light. Conjunctivae and EOM are normal.  Neck: Normal range of motion. Neck supple.  Cardiovascular: Normal rate, regular rhythm and normal heart sounds.   Pulmonary/Chest: Effort normal. She has no wheezes.  Musculoskeletal: She exhibits no edema.  varicosities to lower extremities  Neurological: She is alert.  Skin: Skin is warm and dry.  Psychiatric: Her affect is blunt. Cognition and memory are impaired. She exhibits abnormal remote memory.    Labs reviewed: Basic  Metabolic Panel:  Recent Labs  02/01/17 1857  04/15/17 1529 04/23/17 1534 07/20/17 1421 08/13/17 1335  NA  --   < > 142 144  143  --   K  --   < > 5.0 5.0 3.8  --   CL  --   < > 97 103 98  --   CO2  --   < > 29 27 28   --   GLUCOSE  --   < > 98 96 106*  --   BUN  --   < > 22 23 20   --   CREATININE  --   < > 1.24* 1.22* 1.18*  --   CALCIUM  --   < > 9.3 9.1 8.6*  --   MG 2.1  --   --   --   --   --   PHOS 3.0  --   --   --   --   --   TSH  --   --   --   --   --  0.888  < > = values in this interval not displayed. Liver Function Tests:  Recent Labs  02/01/17 1450 02/02/17 0511 02/16/17 1457  AST 24 21 23   ALT 16 16 15   ALKPHOS 60 50 59  BILITOT 1.1 0.7 0.8  PROT 5.9* 5.7* 5.8*  ALBUMIN 3.1* 2.9* 3.5*   No results for input(s): LIPASE, AMYLASE in the last 8760 hours. No results for input(s): AMMONIA in the last 8760 hours. CBC:  Recent Labs  02/01/17 1450 02/02/17 0511 02/03/17 0656 02/16/17 1457  WBC 9.2 5.7 11.8* 9.1  NEUTROABS 5.8  --   --  6,188  HGB 13.3 12.6 12.3 13.4  HCT 42.8 40.5 39.4 42.1  MCV 90.5 89.4 90.0 88.1  PLT 206 153 182 239   Lipid Panel: No results for input(s): CHOL, HDL, LDLCALC, TRIG, CHOLHDL, LDLDIRECT in the last 8760 hours. TSH:  Recent Labs  08/13/17 1335  TSH 0.888   A1C: Lab Results  Component Value Date   HGBA1C 5.6 04/12/2015     Assessment/Plan 1. Anxiety -will try celexa during the day vs evening dosing to reduce adverse effect   2. COPD GOLD IV -O2 94% on room air, walking O2 91%, she does not meet criteria for O2, doing well without it. reassurance given.   Next appt: 3 months, sooner if needed  Truth Barot K. Harle Battiest  [facility] [phone] 8 am - 5 pm) [phone] (after hours)

## 2017-09-08 NOTE — Addendum Note (Signed)
Addended by: Denyse Amass on: 09/08/2017 03:46 PM   Modules accepted: Orders

## 2017-09-16 DIAGNOSIS — M1711 Unilateral primary osteoarthritis, right knee: Secondary | ICD-10-CM | POA: Diagnosis not present

## 2017-09-17 ENCOUNTER — Other Ambulatory Visit: Payer: Self-pay | Admitting: Internal Medicine

## 2017-09-17 DIAGNOSIS — K219 Gastro-esophageal reflux disease without esophagitis: Secondary | ICD-10-CM

## 2017-09-22 DIAGNOSIS — M19011 Primary osteoarthritis, right shoulder: Secondary | ICD-10-CM | POA: Diagnosis not present

## 2017-09-22 DIAGNOSIS — M1711 Unilateral primary osteoarthritis, right knee: Secondary | ICD-10-CM | POA: Diagnosis not present

## 2017-10-01 DIAGNOSIS — M1711 Unilateral primary osteoarthritis, right knee: Secondary | ICD-10-CM | POA: Diagnosis not present

## 2017-10-01 DIAGNOSIS — M19011 Primary osteoarthritis, right shoulder: Secondary | ICD-10-CM | POA: Diagnosis not present

## 2017-10-20 ENCOUNTER — Other Ambulatory Visit: Payer: Self-pay | Admitting: *Deleted

## 2017-10-20 MED ORDER — DICLOFENAC SODIUM 1 % TD GEL: 1.0000 "application " | Freq: Two times a day (BID) | TRANSDERMAL | 1 refills | Status: DC | PRN

## 2017-10-20 NOTE — Telephone Encounter (Signed)
[facility] 

## 2017-10-21 ENCOUNTER — Other Ambulatory Visit: Payer: Self-pay | Admitting: *Deleted

## 2017-10-21 MED ORDER — DICLOFENAC SODIUM 1 % TD GEL: 1.0000 "application " | Freq: Two times a day (BID) | TRANSDERMAL | 1 refills | Status: DC | PRN

## 2017-10-21 NOTE — Telephone Encounter (Signed)
Received fax from [place] stating 1 tube will only last about a week. Wants Rx written for more than 1 tube. Rx faxed.

## 2017-10-28 DIAGNOSIS — M1711 Unilateral primary osteoarthritis, right knee: Secondary | ICD-10-CM | POA: Diagnosis not present

## 2017-11-04 DIAGNOSIS — M1711 Unilateral primary osteoarthritis, right knee: Secondary | ICD-10-CM | POA: Diagnosis not present

## 2017-11-09 ENCOUNTER — Other Ambulatory Visit: Payer: Self-pay | Admitting: *Deleted

## 2017-11-09 ENCOUNTER — Ambulatory Visit: Payer: Medicare Other | Admitting: Internal Medicine

## 2017-11-09 DIAGNOSIS — K219 Gastro-esophageal reflux disease without esophagitis: Secondary | ICD-10-CM

## 2017-11-09 MED ORDER — PANTOPRAZOLE SODIUM 40 MG PO TBEC: 40.0000 mg | DELAYED_RELEASE_TABLET | Freq: Every day | ORAL | 2 refills | Status: DC

## 2017-11-09 NOTE — Telephone Encounter (Signed)
[facility] 

## 2017-11-12 ENCOUNTER — Ambulatory Visit: Payer: Medicare Other | Admitting: Adult Health

## 2017-11-12 ENCOUNTER — Telehealth: Payer: Self-pay | Admitting: Internal Medicine

## 2017-11-12 NOTE — Telephone Encounter (Signed)
Spoke with patient's brother. Offered appointment in [place] with Dr. Harrington Challenger but prefers to come to [facility] with APP. Scheduled for 12/28 with APP.  Pt may be little more SOB than normal, swelling about as much as usual.  Little less active than normal.

## 2017-11-12 NOTE — Telephone Encounter (Signed)
Patient brother [name]) calling, states that patient needs to come in to see Dr. Harrington Challenger, did not want to wait until her next availability in February or her PA's availability in January.

## 2017-11-17 ENCOUNTER — Ambulatory Visit: Payer: Medicare Other | Admitting: Adult Health

## 2017-11-18 ENCOUNTER — Encounter: Payer: Self-pay | Admitting: Adult Health

## 2017-11-19 ENCOUNTER — Encounter: Payer: Self-pay | Admitting: Adult Health

## 2017-11-19 ENCOUNTER — Ambulatory Visit (INDEPENDENT_AMBULATORY_CARE_PROVIDER_SITE_OTHER): Payer: Medicare Other | Admitting: Adult Health

## 2017-11-19 VITALS — BP 141/78 | HR 68 | Wt 196.2 lb

## 2017-11-19 DIAGNOSIS — R413 Other amnesia: Secondary | ICD-10-CM | POA: Diagnosis not present

## 2017-11-19 DIAGNOSIS — I6523 Occlusion and stenosis of bilateral carotid arteries: Secondary | ICD-10-CM | POA: Diagnosis not present

## 2017-11-19 NOTE — Patient Instructions (Signed)
Your Plan:  Continue Aricept and namenda Memory score is slightly decreased Engage in social interaction and activities. Good to do word puzzles and word searches If your symptoms worsen or you develop new symptoms please let us know.   Thank you for coming to see [place] at [facility]. I hope we have been able to provide you high quality care today.  You may receive a patient satisfaction survey over the next few weeks. We would appreciate your feedback and comments so that we may continue to improve ourselves and the health of our patients.

## 2017-11-19 NOTE — Progress Notes (Signed)
I have read the note, and I agree with the clinical assessment and plan.  Virginia [name] K Luv Mish   

## 2017-11-19 NOTE — Progress Notes (Signed)
PATIENT: Virginia [name] DOB: [birth year]  REASON FOR VISIT: follow up-memory disturbance HISTORY FROM: patient  HISTORY OF PRESENT ILLNESS: Today 11/19/17 Virginia [name] a 81 year old female with a history of memory disturbance.  She returns today for follow-up.  She remains on Aricept and Namenda and is tolerating the medications well.  She feels that her memory has remained stable.  He is able to complete all ADLs independently.  She denies any trouble sleeping.  She denies hallucinations denies any changes in her mood or behavior.  She lives with her brother.  He is just her with all her cooking, finances and medication management.  She denies any new symptoms.  She returns today for evaluation.  HISTORY 05/13/17: Virginia [name]  Is a 81 year old female with a history of progressive memory disturbance. She returns today for follow-up. The patient is currently on Aricept and Namenda and tolerating the medications well. She is currently living with her brother. Her brother helps her with her finances. He also does all the cooking. He manages her medication and use of with her appointments. The patient does not operate a motor vehicle. She denies any trouble sleeping. Denies any hallucinations. No change in mood or behavior. She returns today for an evaluation.   REVIEW OF SYSTEMS: Out of a complete 14 system review of symptoms, the patient complains only of the following symptoms, and all other reviewed systems are negative.  See HPI  ALLERGIES: Allergies  Allergen Reactions  . Lidocaine Anaphylaxis, Swelling, Rash and Other (See Comments)    Any of the " [name] "  . Other Other (See Comments)    All drugs that end in "cane'-  novacane etc. Daughter states patient almost died when she had it when she was born    HOME MEDICATIONS: Outpatient Medications Prior to Visit  Medication Sig Dispense Refill  . albuterol (PROVENTIL) (2.5 MG/3ML) 0.083% nebulizer solution Take 6 mLs (5 mg total)  by nebulization every 4 (four) hours as needed for wheezing or shortness of breath. 75 mL 12  . ARTIFICIAL TEAR OP Place 2 drops into both eyes daily.     Marland Kitchen aspirin EC 81 MG tablet Take 81 mg by mouth every evening.    Marland Kitchen atorvastatin (LIPITOR) 20 MG tablet take 1 tablet by mouth once daily 90 tablet 3  . azelastine (ASTELIN) 0.1 % nasal spray Place 2 sprays into both nostrils 2 (two) times daily. Use in each nostril as directed 30 mL 5  . BREO ELLIPTA 100-25 MCG/INH AEPB inhale 1 puff INTO THE LUNGS daily 60 each 5  . BYSTOLIC 5 MG tablet take 1 tablet by mouth once daily 90 tablet 2  . Calcium Carbonate-Vitamin D (CALCIUM-D PO) Take 1 tablet by mouth every evening.    . Carbinoxamine Maleate ER [facility]) 4 MG/5ML SUER Take 7.5 mLs by mouth every 12 (twelve) hours as needed. 450 mL 5  . diclofenac sodium (VOLTAREN) 1 % GEL Apply 1 application topically 2 (two) times daily as needed (shoulder and knee pain). 4 Tube 1  . donepezil (ARICEPT) 10 MG tablet Take 10 mg by mouth daily.   0  . fluticasone (FLONASE) 50 MCG/ACT nasal spray Place 2 sprays into both nostrils daily. 16 g 5  . furosemide (LASIX) 20 MG tablet Take 1 tablet (20 mg total) by mouth daily. 90 tablet 3  . ipratropium-albuterol (DUONEB) 0.5-2.5 (3) MG/3ML SOLN inhale contents of 1 vial in nebulizer every 6 hours 360 mL 12  .  memantine (NAMENDA) 10 MG tablet Take 1 tablet (10 mg total) by mouth 2 (two) times daily. 180 tablet 3  . Multiple Vitamins-Minerals (PRESERVISION AREDS 2) CAPS Take 1 capsule by mouth 2 (two) times daily.     . ondansetron (ZOFRAN ODT) 4 MG disintegrating tablet Take 1 tablet (4 mg total) by mouth every 8 (eight) hours as needed for nausea or vomiting. 90 tablet 2  . pantoprazole (PROTONIX) 40 MG tablet Take 1 tablet (40 mg total) by mouth daily. 90 tablet 2  . ranitidine (ZANTAC) 150 MG tablet Take 150 mg by mouth daily.  0  . VAYACOG 100-19.5-6.5 MG CAPS take 1 capsule by mouth once daily 90 capsule 3  .  citalopram (CELEXA) 10 MG tablet Take 1 tablet (10 mg total) by mouth daily. (Patient not taking: Reported on 11/19/2017) 90 tablet 0  . ranitidine (ZANTAC) 150 MG tablet Take 1 tablet (150 mg total) by mouth at bedtime. 30 tablet 4  . Vitamin D, Ergocalciferol, (DRISDOL) 50000 units CAPS capsule Take 1 capsule (50,000 Units total) by mouth every 7 (seven) days. 12 capsule 2   No facility-administered medications prior to visit.     PAST MEDICAL HISTORY: Past Medical History:  Diagnosis Date  . Arthritis   . Carotid artery occlusion   . CKD (chronic kidney disease)   . COPD (chronic obstructive pulmonary disease) ([place])   . Diverticulosis   . Fall at home Sept. 2013  . Hyperlipidemia   . Hypertension   . Internal hemorrhoid   . Memory disorder 10/24/2014    PAST SURGICAL HISTORY: Past Surgical History:  Procedure Laterality Date  . APPENDECTOMY    . BREAST REDUCTION SURGERY    . CAROTID ENDARTERECTOMY     left CEA  . CATARACT EXTRACTION Bilateral   . COLONOSCOPY  2015  . EYE SURGERY     cataracts removed, bilaterally   . skin cancer removal    . TONSILLECTOMY      FAMILY HISTORY: Family History  Problem Relation Age of Onset  . Heart disease Father        Before age 70  . Hypertension Father   . Heart attack Father   . Cancer Mother 65       Brain  . Dementia Brother   . Cancer Maternal Aunt 61       breast cancer  . Diabetes Maternal Aunt   . Heart failure Maternal Grandmother   . Diabetes Maternal Grandmother   . Stroke Neg Hx     SOCIAL HISTORY: Social History   Socioeconomic History  . Marital status: Divorced    Spouse name: Not on file  . Number of children: 2  . Years of education: 7  . Highest education level: Not on file  Social Needs  . Financial resource strain: Not on file  . Food insecurity - worry: Not on file  . Food insecurity - inability: Not on file  . Transportation needs - medical: Not on file  . Transportation needs -  non-medical: Not on file  Occupational History  . Occupation: retired  Tobacco Use  . Smoking status: Former Smoker    Packs/day: 0.25    Years: 20.00    Pack years: 5.00    Types: Cigarettes    Last attempt to quit: 11/30/1994    Years since quitting: 22.9  . Smokeless tobacco: Never Used  . Tobacco comment: quit  Substance and Sexual Activity  . Alcohol use: No  Alcohol/week: 0.0 oz  . Drug use: No  . Sexual activity: No    Birth control/protection: Post-menopausal  Other Topics Concern  . Not on file  Social History Narrative   Diet:       Do you drink/eat things with caffeine: yes      Marital Status: Divorced   What Year Married:1959      Do you live in a house, apartment, Assisted Living, Condo, trailer? House      Is it one or more stories? 2 stories      How many persons live in your home? Just Patient      Do you have any pets in your home? 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 Very Little   Type and how often? Walk      Do you have a living will? None   DNR?   Discuss one? No      Do you have signed POA/HPOA forms?      Patient drinks 1 cup of caffeine daily.   Patient is right handed.      PHYSICAL EXAM  Vitals:   11/19/17 1421  BP: (!) 141/78  Pulse: 68  Weight: 196 lb 3.2 oz (89 kg)   Body mass index is 34.76 kg/m.  Generalized: Well developed, in no acute distress   Neurological examination  Mentation: Alert oriented to time, place, history taking. Follows all commands speech and language fluent Cranial nerve II-XII: Pupils were equal round reactive to light. Extraocular movements were full, visual field were full on confrontational test. Facial sensation and strength were normal. Uvula tongue midline. Head turning and shoulder shrug  were normal and symmetric. Motor: The motor testing reveals 5 over 5 strength of all 4 extremities. Good symmetric motor tone is noted throughout.  Sensory: Sensory  testing is intact to soft touch on all 4 extremities. No evidence of extinction is noted.  Coordination: Cerebellar testing reveals good finger-nose-finger and heel-to-shin bilaterally.  Gait and station: Gait is normal.  Reflexes: Deep tendon reflexes are symmetric and normal bilaterally.   DIAGNOSTIC DATA (LABS, IMAGING, TESTING) - I reviewed patient records, labs, notes, testing and imaging myself where available.  Lab Results  Component Value Date   WBC 9.1 02/16/2017   HGB 13.4 02/16/2017   HCT 42.1 02/16/2017   MCV 88.1 02/16/2017   PLT 239 02/16/2017      Component Value Date/Time   NA 143 07/20/2017 1421   K 3.8 07/20/2017 1421   CL 98 07/20/2017 1421   CO2 28 07/20/2017 1421   GLUCOSE 106 (H) 07/20/2017 1421   GLUCOSE 99 02/16/2017 1457   BUN 20 07/20/2017 1421   CREATININE 1.18 (H) 07/20/2017 1421   CREATININE 1.15 (H) 02/16/2017 1457   CALCIUM 8.6 (L) 07/20/2017 1421   PROT 5.8 (L) 02/16/2017 1457   ALBUMIN 3.5 (L) 02/16/2017 1457   AST 23 02/16/2017 1457   ALT 15 02/16/2017 1457   ALKPHOS 59 02/16/2017 1457   BILITOT 0.8 02/16/2017 1457   GFRNONAA 44 (L) 07/20/2017 1421   GFRNONAA 46 (L) 02/16/2017 1457   GFRAA 51 (L) 07/20/2017 1421   GFRAA 53 (L) 02/16/2017 1457   Lab Results  Component Value Date   CHOL 167 08/01/2015   HDL 61.40 08/01/2015   LDLCALC 81 08/01/2015   LDLDIRECT 142.0 05/24/2015   TRIG 119.0 08/01/2015   CHOLHDL 3 08/01/2015   Lab Results  Component Value Date   HGBA1C 5.6 04/12/2015   Lab Results  Component Value Date   [id number] 619 10/24/2014   Lab Results  Component Value Date   TSH 0.888 08/13/2017      ASSESSMENT AND PLAN 81 y.o. year old female  has a past medical history of Arthritis, Carotid artery occlusion, CKD (chronic kidney disease), COPD (chronic obstructive pulmonary disease) ([place]), Diverticulosis, Fall at home (Sept. 2013), Hyperlipidemia, Hypertension, Internal hemorrhoid, and Memory disorder (10/24/2014).  here with:  1.  Memory disturbance  The patient's memory score has declined slightly.  She will continue on Aricept and Namenda  I encouraged the patient to engage in social interaction and activities.  Also advised that doing things such as word puzzles and word searches are beneficial for the memory.  He voiced understanding.  She will follow-up in 6 months or sooner if needed.  I spent 15 minutes with the patient. 50% of this time was spent reviewing the memory score.     Ward Givens, MSN, NP-C 11/19/2017, 2:38 PM [facility] [address] [place], [place] [address] [phone]

## 2017-11-25 ENCOUNTER — Ambulatory Visit: Payer: Medicare Other | Admitting: Family

## 2017-11-25 ENCOUNTER — Encounter (HOSPITAL_COMMUNITY): Payer: Medicare Other

## 2017-11-26 ENCOUNTER — Encounter: Payer: Self-pay | Admitting: Physician Assistant

## 2017-11-26 DIAGNOSIS — M1711 Unilateral primary osteoarthritis, right knee: Secondary | ICD-10-CM | POA: Diagnosis not present

## 2017-11-26 DIAGNOSIS — M19011 Primary osteoarthritis, right shoulder: Secondary | ICD-10-CM | POA: Diagnosis not present

## 2017-11-26 NOTE — Progress Notes (Signed)
Cardiology Office Note    Date:  11/27/2017  ID:  Virginia [name], DOB 07-08-1936, MRN [id number] PCP:  Lauree Chandler, NP  Cardiologist:  Dr. Harrington Challenger   Chief Complaint: f/u CHF  History of Present Illness:  Virginia [name] is a 81 y.o. female with history of chronic diastolic CHF, COPD GOLD IV on home O2 PRN, HTN, HLD, dementia, carotid artery disease s/p L CEA, internal hemorrhoid, diverticulosis, CKD stage III who presents for routine follow-up.  Prior 2D echo 12/2014 showed EF 65-70%, grade 1 DD, calcified mitral annulus. Last carotid duplex 08/2016 showed patent L CEA< 1-39% BICA (routinely followed by vascular team, has f/u 12/2017). She was seen earlier this year by [facility] and Dr. Harrington Challenger. On both occasions she had some lower extremity edema. BNP was normal at 07/2017 visit with Dr. Harrington Challenger. Last pertinent labs otherwise include normal TSH (08/2017), K 3.8, Cr 1.18 (07/2017), Hgb 13.4 and plt 239 (01/2017).  She returns for follow-up with her brother. She is pleasantly stoic and does not contribute much actively to the conversation. She states she's not having any problems whatsoever. She denies any shortness of breath, chest pain, palpitations, syncope, or dizziness. She has chronic lower extremity edema which she states is decreased in the AM and worsens by the evening. She has not been compliant with low salt diet. She usually does chair yoga but over the holidays has been much more sedentary (not for any particular reason). Her weight is 201 by our scale today.     Past Medical History:  Diagnosis Date  . Arthritis   . Carotid artery disease ([place])    a. s/p L CEA. b. followed by [facility].  . Chronic diastolic CHF (congestive heart failure) ([place])   . CKD (chronic kidney disease), stage III ([place])   . COPD (chronic obstructive pulmonary disease) ([place])   . Diverticulosis   . Fall at home Sept. 2013  . Hyperlipidemia   . Hypertension   . Internal hemorrhoid   . Memory disorder 10/24/2014     Past Surgical History:  Procedure Laterality Date  . APPENDECTOMY    . BREAST REDUCTION SURGERY    . CAROTID ENDARTERECTOMY     left CEA  . CATARACT EXTRACTION Bilateral   . COLONOSCOPY  2015  . EYE SURGERY     cataracts removed, bilaterally   . skin cancer removal    . TONSILLECTOMY      Current Medications: Current Meds  Medication Sig  . albuterol (PROVENTIL) (2.5 MG/3ML) 0.083% nebulizer solution Take 6 mLs (5 mg total) by nebulization every 4 (four) hours as needed for wheezing or shortness of breath.  . ARTIFICIAL TEAR OP Place 2 drops into both eyes daily.   Marland Kitchen aspirin EC 81 MG tablet Take 81 mg by mouth every evening.  Marland Kitchen atorvastatin (LIPITOR) 20 MG tablet take 1 tablet by mouth once daily  . azelastine (ASTELIN) 0.1 % nasal spray Place 2 sprays into both nostrils 2 (two) times daily. Use in each nostril as directed  . BREO ELLIPTA 100-25 MCG/INH AEPB inhale 1 puff INTO THE LUNGS daily  . BYSTOLIC 5 MG tablet take 1 tablet by mouth once daily  . Calcium Carbonate-Vitamin D (CALCIUM-D PO) Take 1 tablet by mouth every evening.  . Carbinoxamine Maleate ER [facility] ER) 4 MG/5ML SUER Take 7.5 mLs by mouth every 12 (twelve) hours as needed.  . diclofenac sodium (VOLTAREN) 1 % GEL Apply 1 application topically 2 (two) times  daily as needed (shoulder and knee pain).  Marland Kitchen donepezil (ARICEPT) 10 MG tablet Take 10 mg by mouth daily.   . fluticasone (FLONASE) 50 MCG/ACT nasal spray Place 2 sprays into both nostrils daily.  . furosemide (LASIX) 20 MG tablet Take 1 tablet (20 mg total) by mouth daily.  Marland Kitchen ipratropium-albuterol (DUONEB) 0.5-2.5 (3) MG/3ML SOLN inhale contents of 1 vial in nebulizer every 6 hours  . memantine (NAMENDA) 10 MG tablet Take 1 tablet (10 mg total) by mouth 2 (two) times daily.  . Multiple Vitamins-Minerals (PRESERVISION AREDS 2) CAPS Take 1 capsule by mouth 2 (two) times daily.   . ondansetron (ZOFRAN ODT) 4 MG disintegrating tablet Take 1 tablet (4 mg total)  by mouth every 8 (eight) hours as needed for nausea or vomiting.  . pantoprazole (PROTONIX) 40 MG tablet Take 1 tablet (40 mg total) by mouth daily.  . ranitidine (ZANTAC) 150 MG tablet Take 150 mg by mouth daily.  Marland Kitchen VAYACOG 100-19.5-6.5 MG CAPS take 1 capsule by mouth once daily     Allergies:   Lidocaine and Other   Social History   Socioeconomic History  . Marital status: Divorced    Spouse name: None  . Number of children: 2  . Years of education: 7  . Highest education level: None  Social Needs  . Financial resource strain: None  . Food insecurity - worry: None  . Food insecurity - inability: None  . Transportation needs - medical: None  . Transportation needs - non-medical: None  Occupational History  . Occupation: retired  Tobacco Use  . Smoking status: Former Smoker    Packs/day: 0.25    Years: 20.00    Pack years: 5.00    Types: Cigarettes    Last attempt to quit: 11/30/1994    Years since quitting: 23.0  . Smokeless tobacco: Never Used  . Tobacco comment: quit  Substance and Sexual Activity  . Alcohol use: No    Alcohol/week: 0.0 oz  . Drug use: No  . Sexual activity: No    Birth control/protection: Post-menopausal  Other Topics Concern  . None  Social History Narrative   Diet:       Do you drink/eat things with caffeine: yes      Marital Status: Divorced   What Year Married:1959      Do you live in a house, apartment, Assisted Living, [place], trailer? House      Is it one or more stories? 2 stories      How many persons live in your home? Just Patient      Do you have any pets in your home? 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 Very Little   Type and how often? Walk      Do you have a living will? None   DNR?   Discuss one? No      Do you have signed POA/HPOA forms?      Patient drinks 1 cup of caffeine daily.   Patient is right handed.     Family History:  Family History  Problem Relation Age of  Onset  . Heart disease Father        Before age 78  . Hypertension Father   . Heart attack Father   . Cancer Mother 36       Brain  . Dementia Brother   . Cancer Maternal Aunt 7  breast cancer  . Diabetes Maternal Aunt   . Heart failure Maternal Grandmother   . Diabetes Maternal Grandmother   . Stroke Neg Hx     ROS:   Please see the history of present illness.  All other systems are reviewed and otherwise negative.    PHYSICAL EXAM:   VS:  BP 122/66   Pulse 74   Ht 5\' 3"  (1.6 m)   Wt 201 lb 6.4 oz (91.4 kg)   SpO2 91%   BMI 35.68 kg/m   BMI: Body mass index is 35.68 kg/m. GEN: Well nourished, well developed elderly WF, in no acute distress  HEENT: normocephalic, atraumatic Neck: no JVD, carotid bruits, or masses Cardiac: RRR; no murmurs, rubs, or gallops, trace soft pitting LE edema bilaterally Respiratory:  clear to auscultation bilaterally, normal work of breathing GI: soft, nontender, nondistended, + BS MS: no deformity or atrophy  Skin: warm and dry, no rash Neuro:  Alert and Oriented x 3, Strength and sensation are intact, follows commands Psych: euthymic mood, full affect  Wt Readings from Last 3 Encounters:  11/27/17 201 lb 6.4 oz (91.4 kg)  11/19/17 196 lb 3.2 oz (89 kg)  09/08/17 191 lb 3.2 oz (86.7 kg)      Studies/Labs Reviewed:   EKG:  EKG was ordered today and personally reviewed by me and demonstrates NSR lower voltage QRS, no acute changes.  Recent Labs: 02/01/2017: B Natriuretic Peptide 174.6; Magnesium 2.1 02/16/2017: ALT 15; Hemoglobin 13.4; Platelets 239 07/20/2017: BUN 20; Creatinine, Ser 1.18; NT-Pro BNP 544; Potassium 3.8; Sodium 143 08/13/2017: TSH 0.888   Lipid Panel    Component Value Date/Time   CHOL 167 08/01/2015 0857   TRIG 119.0 08/01/2015 0857   HDL 61.40 08/01/2015 0857   CHOLHDL 3 08/01/2015 0857   VLDL 23.8 08/01/2015 0857   LDLCALC 81 08/01/2015 0857   LDLDIRECT 142.0 05/24/2015 1108    Additional studies/  records that were reviewed today include: Summarized above.    ASSESSMENT & PLAN:   1. Chronic diastolic CHF with peripheral edema - not clear how much of edema is actually CHF versus simply dependent edema since BNP was previously normal. Albumin was previously on the low side at 2.9 which could be contributing. She does appear to have gained several lbs in the last few months. However, review of Epic shows an approximate 10lb weight gain yearly. She was 160s-170s in 2016, 170s-180s in 2017, and 180s-190s in 2018 thus I feel this is likely partially due to body weight from dietary noncompliance and inactivity. I encouraged regular physical activity, 2g sodium and 2L fluid restriction, and daily weights. Will increase Lasix to 40mg  x 3 days and then back down to 20mg  daily. Also discussed compression stockings and elevation of legs. She does not wish to use compression hose right now. I told her brother if her weight does not come back down to the upper 190s in the next few days to call us. Her brother says they have routine f/u with primary care in January and that they anticipate she'll be due for routine labs at that time, but states that Lanny Hurst in our lab is one of the few phlebotomists who can actually stick her. He requests we draw today if possible. Therefore will obtain BMET, CBC, BNP and vitamin D level. 2. HTN - controlled.  3. Hyperlipidemia - no myalgias. Continue statin. 4. CKD stage III - f/u Cr today.  Disposition: F/u with Dr. Harrington Challenger in 6 months.  Medication Adjustments/Labs and Tests Ordered: Current medicines are reviewed at length with the patient today.  Concerns regarding medicines are outlined above. Medication changes, Labs and Tests ordered today are summarized above and listed in the Patient Instructions accessible in Encounters.   Signed, Charlie Pitter, PA-C  11/27/2017 11:51 AM    [address], [place], [place]  [id number] Phone: [phone]; Fax: [phone]

## 2017-11-27 ENCOUNTER — Ambulatory Visit (INDEPENDENT_AMBULATORY_CARE_PROVIDER_SITE_OTHER): Payer: Medicare Other | Admitting: Physician Assistant

## 2017-11-27 ENCOUNTER — Encounter (HOSPITAL_COMMUNITY): Payer: Medicare Other

## 2017-11-27 ENCOUNTER — Ambulatory Visit: Payer: Medicare Other | Admitting: Family

## 2017-11-27 ENCOUNTER — Encounter: Payer: Self-pay | Admitting: Physician Assistant

## 2017-11-27 VITALS — BP 122/66 | HR 74 | Ht 63.0 in | Wt 201.4 lb

## 2017-11-27 DIAGNOSIS — I5032 Chronic diastolic (congestive) heart failure: Secondary | ICD-10-CM

## 2017-11-27 DIAGNOSIS — N183 Chronic kidney disease, stage 3 unspecified: Secondary | ICD-10-CM

## 2017-11-27 DIAGNOSIS — R600 Localized edema: Secondary | ICD-10-CM

## 2017-11-27 DIAGNOSIS — E785 Hyperlipidemia, unspecified: Secondary | ICD-10-CM

## 2017-11-27 DIAGNOSIS — I10 Essential (primary) hypertension: Secondary | ICD-10-CM

## 2017-11-27 DIAGNOSIS — I6523 Occlusion and stenosis of bilateral carotid arteries: Secondary | ICD-10-CM

## 2017-11-27 DIAGNOSIS — E559 Vitamin D deficiency, unspecified: Secondary | ICD-10-CM | POA: Diagnosis not present

## 2017-11-27 NOTE — Patient Instructions (Addendum)
Medication Instructions:  Your physician has recommended you make the following change in your medication:  1.  INCREASE the Lasix to 2 tablets a day for 3 days then go back down to 1 tablet daily after that.  Labwork: TODAY:  BMET, CBC, PRO BNP, & VIT D  Testing/Procedures: None ordered  Follow-Up: Your physician wants you to follow-up in: [place] DR. ROSS  You will receive a reminder letter in the mail two months in advance. If you don't receive a letter, please call our office to schedule the follow-up appointment.    Any Other Special Instructions Will Be Listed Below (If Applicable).     If you need a refill on your cardiac medications before your next appointment, please call your pharmacy.

## 2017-11-28 LAB — CBC
HEMOGLOBIN: 13 g/dL (ref 11.1–15.9)
Hematocrit: 40.1 % (ref 34.0–46.6)
MCH: 29 pg (ref 26.6–33.0)
MCHC: 32.4 g/dL (ref 31.5–35.7)
MCV: 89 fL (ref 79–97)
Platelets: 249 10*3/uL (ref 150–379)
RBC: 4.49 x10E6/uL (ref 3.77–5.28)
RDW: 14 % (ref 12.3–15.4)
WBC: 11.1 10*3/uL — AB (ref 3.4–10.8)

## 2017-11-28 LAB — BASIC METABOLIC PANEL
BUN/Creatinine Ratio: 17 (ref 12–28)
BUN: 16 mg/dL (ref 8–27)
CO2: 24 mmol/L (ref 20–29)
CREATININE: 0.93 mg/dL (ref 0.57–1.00)
Calcium: 8.7 mg/dL (ref 8.7–10.3)
Chloride: 104 mmol/L (ref 96–106)
GFR calc Af Amer: 68 mL/min/{1.73_m2} (ref >59)
GFR calc non Af Amer: 59 mL/min/{1.73_m2} — ABNORMAL LOW (ref >59)
GLUCOSE: 131 mg/dL — AB (ref 65–99)
Potassium: 4.3 mmol/L (ref 3.5–5.2)
Sodium: 141 mmol/L (ref 134–144)

## 2017-11-28 LAB — VITAMIN D 25 HYDROXY (VIT D DEFICIENCY, FRACTURES): VIT D 25 HYDROXY: 51 ng/mL (ref 30.0–100.0)

## 2017-11-28 LAB — PRO B NATRIURETIC PEPTIDE: NT-PRO BNP: 1176 pg/mL — AB (ref 0–738)

## 2017-11-30 ENCOUNTER — Telehealth: Payer: Self-pay | Admitting: *Deleted

## 2017-11-30 MED ORDER — FUROSEMIDE 20 MG PO TABS: ORAL_TABLET | ORAL | 3 refills | Status: DC

## 2017-11-30 NOTE — Telephone Encounter (Signed)
-----   Message from Charlie Pitter, [place] sent at 11/30/2017  8:47 AM EST ----- Please call patient/brother. Labs are normal except a few findings - need to f/u PCP for mildly elevated blood sugar and mildly elevated WBC count. No recent symptoms of infection to go along with white count, so should just discuss repeating value with primary care.   BNP level was elevated, consistent with volume overload (still below total rule-in threshold for CHF, but higher than last time). Keep plan as discussed for increased dose of Lasix x 3 days. I would recommend from then on they take Lasix 20mg  daily with 1 extra tablet daily as needed for weight gain of 3-5lb above baseline weight or extra swelling. Need to be very strict on sodium - max 2000mg  a day, no more than 1.5-2L of fluid intake per day. Can offer recheck visit in a week or so if they feel this is necessary just to make sure she's headed in a right direction with weight/fluid status.  Dayna Dunn PA-C

## 2017-12-03 ENCOUNTER — Telehealth: Payer: Self-pay | Admitting: Internal Medicine

## 2017-12-03 NOTE — Telephone Encounter (Signed)
Spoke to patient's brother, Hughes Better. [facility])  Usually only checks BP daily but checked several times today due to this am 449/201--EOFHQ to taking Bystolic. Most recent BP this afternoon were 130/84, 113/72.  HRs stable between 64-77.  At [place] with Melina Copa, PA-C on 12/28 BP was 122/66. Seeing PCP next Wed. Has an old, expired prescription (from prev PCP) for amlodipine 2.5 mg with instruction to take 1 tab if SBP > 170 for more than 3 hours.  Kept this as a safety net but to his knowledge pt has not ever needed.  He will discuss refilling this with PCP.  Discussed low sodium diet.  Pt may have eaten more salt than normal over the last couple weeks.  He will continue to monitor BP and take list of readings to appointment next week.  He will call back for appointment if needed but will see PCP first.

## 2017-12-03 NOTE — Telephone Encounter (Signed)
New message      Pt c/o BP issue: STAT if pt c/o blurred vision, one-sided weakness or slurred speech  1. What are your last 5 BP readings?  156/94  152/103 167/95 1/2 144/96 107/72 12/31 122/66   2. Are you having any other symptoms (ex. Dizziness, headache, blurred vision, passed out)? no  3. What is your BP issue?  bp is fluctuating and they are concerned

## 2017-12-09 ENCOUNTER — Ambulatory Visit (INDEPENDENT_AMBULATORY_CARE_PROVIDER_SITE_OTHER): Payer: Medicare Other | Admitting: Nurse Practitioner

## 2017-12-09 ENCOUNTER — Encounter: Payer: Self-pay | Admitting: Nurse Practitioner

## 2017-12-09 VITALS — BP 126/82 | HR 63 | Temp 98.3°F | Resp 17 | Ht 63.0 in | Wt 192.0 lb

## 2017-12-09 DIAGNOSIS — K219 Gastro-esophageal reflux disease without esophagitis: Secondary | ICD-10-CM

## 2017-12-09 DIAGNOSIS — M150 Primary generalized (osteo)arthritis: Secondary | ICD-10-CM

## 2017-12-09 DIAGNOSIS — R739 Hyperglycemia, unspecified: Secondary | ICD-10-CM | POA: Diagnosis not present

## 2017-12-09 DIAGNOSIS — N183 Chronic kidney disease, stage 3 unspecified: Secondary | ICD-10-CM

## 2017-12-09 DIAGNOSIS — E785 Hyperlipidemia, unspecified: Secondary | ICD-10-CM

## 2017-12-09 DIAGNOSIS — M159 Polyosteoarthritis, unspecified: Secondary | ICD-10-CM

## 2017-12-09 DIAGNOSIS — I5032 Chronic diastolic (congestive) heart failure: Secondary | ICD-10-CM

## 2017-12-09 NOTE — Patient Instructions (Addendum)
Follow up within a week for FASTING BLOOD WORK here   Recommend routine exercise to keep you strong and mobile also helps with pain.   Need to be getting weekly bath by aide

## 2017-12-09 NOTE — Progress Notes (Signed)
  Careteam: Patient Care Team: ,  K, NP as PCP - General (Nurse Practitioner) Ross, Paula V, MD as Consulting Physician (Cardiology) Wert, Michael B, MD as Consulting Physician (Pulmonary Disease)  Advanced Directive information    Allergies  Allergen Reactions  . Lidocaine Anaphylaxis, Swelling, Rash and Other (See Comments)    Any of the " Caine  Family "  . Other Other (See Comments)    All drugs that end in "cane'-  novacane etc. Daughter states patient almost died when she had it when she was born    Chief Complaint  Patient presents with  . Medical Management of Chronic Issues    Pt is being seen for a 3 month routine visit. Pt's brother/daughter have several concerns written down for provider to review.      HPI: Patient is a 81 y.o. female seen in the office today for routine follow up.   Recently followed up with cardiology due to blood pressure variation and thought she may have some increase fluid, pt was placed on additional lasix 20 mg daily for 3 days and to weigh daily which they have trouble doing this at home.  Pt has lost 8 lb doing the diuretics.    Blood sugar was mildly elevated but she was NOT fasting at time of appt  WBC count was also mildly elevated but no signs of infection.   [place] [facility] recommend exercise program but she has not started this yet, pt reports she does not want to do this.   GERD- lifestyle modifications make a big difference which sometimes she does not do therefore symptoms are worse. Working on this regimen.   Not using antidepressant due to side effect   Review of Systems:  Review of Systems  Unable to perform ROS: Dementia    Past Medical History:  Diagnosis Date  . Arthritis   . Carotid artery disease ([facility])    a. s/p L CEA. b. followed by [facility].  . Chronic diastolic CHF (congestive heart failure) ([facility])   . CKD (chronic kidney disease), stage III ([facility])   . COPD (chronic obstructive pulmonary  disease) ([facility])   . Diverticulosis   . Fall at home Sept. 2013  . Hyperlipidemia   . Hypertension   . Internal hemorrhoid   . Memory disorder 10/24/2014   Past Surgical History:  Procedure Laterality Date  . APPENDECTOMY    . BREAST REDUCTION SURGERY    . CAROTID ENDARTERECTOMY     left CEA  . CATARACT EXTRACTION Bilateral   . COLONOSCOPY  2015  . EYE SURGERY     cataracts removed, bilaterally   . skin cancer removal    . TONSILLECTOMY     Social History:   reports that she quit smoking about 23 years ago. Her smoking use included cigarettes. She has a 5.00 pack-year smoking history. she has never used smokeless tobacco. She reports that she does not drink alcohol or use drugs.  Family History  Problem Relation Age of Onset  . Heart disease Father        Before age 60  . Hypertension Father   . Heart attack Father   . Cancer Mother 44       Brain  . Dementia Brother   . Cancer Maternal Aunt 61       breast cancer  . Diabetes Maternal Aunt   . Heart failure Maternal Grandmother   . Diabetes Maternal Grandmother   . Stroke Neg Hx       Medications:   Medication List        Accurate as of 12/09/17  3:01 PM. Always use your most recent med list.          albuterol (2.5 MG/3ML) 0.083% nebulizer solution Commonly known as:  PROVENTIL Take 6 mLs (5 mg total) by nebulization every 4 (four) hours as needed for wheezing or shortness of breath.   ARTIFICIAL TEAR OP   aspirin EC 81 MG tablet   atorvastatin 20 MG tablet Commonly known as:  LIPITOR take 1 tablet by mouth once daily   azelastine 0.1 % nasal spray Commonly known as:  ASTELIN Place 2 sprays into both nostrils 2 (two) times daily. Use in each nostril as directed   BREO ELLIPTA 100-25 MCG/INH Aepb Generic drug:  fluticasone furoate-vilanterol inhale 1 puff INTO THE LUNGS daily   BYSTOLIC 5 MG tablet Generic drug:  nebivolol take 1 tablet by mouth once daily   CALCIUM-D PO   Carbinoxamine Maleate  ER 4 MG/5ML Suer Commonly known as:  KARBINAL ER Take 7.5 mLs by mouth every 12 (twelve) hours as needed.   diclofenac sodium 1 % Gel Commonly known as:  VOLTAREN Apply 1 application topically 2 (two) times daily as needed (shoulder and knee pain).   donepezil 10 MG tablet Commonly known as:  ARICEPT   fluticasone 50 MCG/ACT nasal spray Commonly known as:  FLONASE Place 2 sprays into both nostrils daily.   furosemide 20 MG tablet Commonly known as:  LASIX Take 1 tablet daily, take 1 tablet daily as needed, for weight gain of 3-5 lbs   ipratropium-albuterol 0.5-2.5 (3) MG/3ML Soln Commonly known as:  DUONEB inhale contents of 1 vial in nebulizer every 6 hours   memantine 10 MG tablet Commonly known as:  NAMENDA Take 1 tablet (10 mg total) by mouth 2 (two) times daily.   ondansetron 4 MG disintegrating tablet Commonly known as:  ZOFRAN ODT Take 1 tablet (4 mg total) by mouth every 8 (eight) hours as needed for nausea or vomiting.   pantoprazole 40 MG tablet Commonly known as:  PROTONIX Take 1 tablet (40 mg total) by mouth daily.   PRESERVISION AREDS 2 Caps   ranitidine 150 MG tablet Commonly known as:  ZANTAC   VAYACOG 100-19.5-6.5 MG Caps Generic drug:  Phosphatidylserine-DHA-EPA take 1 capsule by mouth once daily        Physical Exam:  Vitals:   12/09/17 1452  BP: 126/82  Pulse: 63  Resp: 17  Temp: 98.3 F (36.8 C)  TempSrc: Oral  SpO2: 96%  Weight: 192 lb (87.1 kg)  Height: 5' 3" (1.6 m)   Body mass index is 34.01 kg/m.  Physical Exam  Constitutional: She appears well-developed and well-nourished.  HENT:  Head: Normocephalic and atraumatic.  Eyes: Conjunctivae and EOM are normal. Pupils are equal, round, and reactive to light.  Neck: Normal range of motion. Neck supple.  Cardiovascular: Normal rate, regular rhythm and normal heart sounds.  Pulmonary/Chest: Effort normal and breath sounds normal. She has no wheezes.  Abdominal:  Obese abdomen.   Musculoskeletal: She exhibits no edema.  varicosities to lower extremities  Neurological: She is alert.  Skin: Skin is warm and dry.  Psychiatric: Her affect is blunt. Cognition and memory are impaired. She exhibits abnormal remote memory.    Labs reviewed: Basic Metabolic Panel: Recent Labs    02/01/17 1857  04/23/17 1534 07/20/17 1421 08/13/17 1335 11/27/17 1221  NA  --    < > 144   143  --  141  K  --    < > 5.0 3.8  --  4.3  CL  --    < > 103 98  --  104  CO2  --    < > 27 28  --  24  GLUCOSE  --    < > 96 106*  --  131*  BUN  --    < > 23 20  --  16  CREATININE  --    < > 1.22* 1.18*  --  0.93  CALCIUM  --    < > 9.1 8.6*  --  8.7  MG 2.1  --   --   --   --   --   PHOS 3.0  --   --   --   --   --   TSH  --   --   --   --  0.888  --    < > = values in this interval not displayed.   Liver Function Tests: Recent Labs    02/01/17 1450 02/02/17 0511 02/16/17 1457  AST 24 21 23  ALT 16 16 15  ALKPHOS 60 50 59  BILITOT 1.1 0.7 0.8  PROT 5.9* 5.7* 5.8*  ALBUMIN 3.1* 2.9* 3.5*   No results for input(s): LIPASE, AMYLASE in the last 8760 hours. No results for input(s): AMMONIA in the last 8760 hours. CBC: Recent Labs    02/01/17 1450  02/03/17 0656 02/16/17 1457 11/27/17 1221  WBC 9.2   < > 11.8* 9.1 11.1*  NEUTROABS 5.8  --   --  6,188  --   HGB 13.3   < > 12.3 13.4 13.0  HCT 42.8   < > 39.4 42.1 40.1  MCV 90.5   < > 90.0 88.1 89  PLT 206   < > 182 239 249   < > = values in this interval not displayed.   Lipid Panel: No results for input(s): CHOL, HDL, LDLCALC, TRIG, CHOLHDL, LDLDIRECT in the last 8760 hours. TSH: Recent Labs    08/13/17 1335  TSH 0.888   A1C: Lab Results  Component Value Date   HGBA1C 5.6 04/12/2015     Assessment/Plan 1. Gastroesophageal reflux disease, esophagitis presence not specified Stable on current regimen with lifestyle modifications.   2. Primary osteoarthritis involving multiple joints Ongoing, recommended exercise  program for joint health and mobility.   3. Chronic diastolic heart failure ([facility]) -stable, recently with increase in lasix due to LE edema and signs of fluid overload. This has now resolved.   4. CKD (chronic kidney disease), stage III ([facility]) -Encourage proper hydration and to avoid NSAIDS (Aleve, Advil, Motrin, Ibuprofen)  - CBC with Differential/Platelets; Future  5. Hyperlipidemia, unspecified hyperlipidemia type -currently on Lipitor daily  - Lipid Panel; Future - CMP with eGFR; Future  6. Hyperglycemia Noted on labs, will follow up A1c - Hemoglobin A1c; Future  Next appt: 4 month for routine   K. , AGNP  [facility] [phone](Monday-Friday 8 am - 5 pm) [phone] (after hours)  

## 2017-12-10 ENCOUNTER — Other Ambulatory Visit: Payer: Medicare Other

## 2017-12-10 DIAGNOSIS — N183 Chronic kidney disease, stage 3 unspecified: Secondary | ICD-10-CM

## 2017-12-10 DIAGNOSIS — R739 Hyperglycemia, unspecified: Secondary | ICD-10-CM | POA: Diagnosis not present

## 2017-12-10 DIAGNOSIS — E785 Hyperlipidemia, unspecified: Secondary | ICD-10-CM | POA: Diagnosis not present

## 2017-12-11 LAB — LIPID PANEL
CHOL/HDL RATIO: 2.9 (calc) (ref <5.0)
Cholesterol: 204 mg/dL — ABNORMAL HIGH (ref <200)
HDL: 70 mg/dL (ref >50)
LDL Cholesterol (Calc): 111 mg/dL (calc) — ABNORMAL HIGH
NON-HDL CHOLESTEROL (CALC): 134 mg/dL — AB (ref <130)
Triglycerides: 123 mg/dL (ref <150)

## 2017-12-11 LAB — COMPLETE METABOLIC PANEL WITH GFR
AG Ratio: 1.3 (calc) (ref 1.0–2.5)
ALT: 17 U/L (ref 6–29)
AST: 26 U/L (ref 10–35)
Albumin: 3.5 g/dL — ABNORMAL LOW (ref 3.6–5.1)
Alkaline phosphatase (APISO): 70 U/L (ref 33–130)
BUN/Creatinine Ratio: 17 (calc) (ref 6–22)
BUN: 22 mg/dL (ref 7–25)
CO2: 25 mmol/L (ref 20–32)
Calcium: 9.2 mg/dL (ref 8.6–10.4)
Chloride: 105 mmol/L (ref 98–110)
Creat: 1.29 mg/dL — ABNORMAL HIGH (ref 0.60–0.93)
GFR, Est African American: 46 mL/min/{1.73_m2} — ABNORMAL LOW (ref >=60)
GFR, Est Non African American: 39 mL/min/{1.73_m2} — ABNORMAL LOW (ref >=60)
Globulin: 2.6 g/dL (calc) (ref 1.9–3.7)
Glucose, Bld: 80 mg/dL (ref 65–99)
Potassium: 4.7 mmol/L (ref 3.5–5.3)
Sodium: 143 mmol/L (ref 135–146)
Total Bilirubin: 1.3 mg/dL — ABNORMAL HIGH (ref 0.2–1.2)
Total Protein: 6.1 g/dL (ref 6.1–8.1)

## 2017-12-11 LAB — CBC WITH DIFFERENTIAL/PLATELET
BASOS PCT: 0.6 %
Basophils Absolute: 70 cells/uL (ref 0–200)
EOS ABS: 129 {cells}/uL (ref 15–500)
Eosinophils Relative: 1.1 %
HEMATOCRIT: 45.5 % — AB (ref 35.0–45.0)
Hemoglobin: 14.7 g/dL (ref 11.7–15.5)
Lymphs Abs: 2270 cells/uL (ref 850–3900)
MCH: 28.1 pg (ref 27.0–33.0)
MCHC: 32.3 g/dL (ref 32.0–36.0)
MCV: 86.8 fL (ref 80.0–100.0)
MPV: 12.1 fL (ref 7.5–12.5)
Monocytes Relative: 8 %
Neutro Abs: 8295 cells/uL — ABNORMAL HIGH (ref 1500–7800)
Neutrophils Relative %: 70.9 %
PLATELETS: 196 10*3/uL (ref 140–400)
RBC: 5.24 10*6/uL — AB (ref 3.80–5.10)
RDW: 13 % (ref 11.0–15.0)
TOTAL LYMPHOCYTE: 19.4 %
WBC: 11.7 10*3/uL — ABNORMAL HIGH (ref 3.8–10.8)
WBCMIX: 936 {cells}/uL (ref 200–950)

## 2017-12-11 LAB — HEMOGLOBIN A1C
HEMOGLOBIN A1C: 5.2 %{Hb} (ref <5.7)
MEAN PLASMA GLUCOSE: 103 (calc)
eAG (mmol/L): 5.7 (calc)

## 2017-12-18 ENCOUNTER — Telehealth: Payer: Self-pay | Admitting: Adult Health

## 2017-12-18 NOTE — Telephone Encounter (Addendum)
Called patient 's brother, [name] on [place] and informed him that there are sooner appointments with Jinny Blossom, NP. He stated he cannot bring her in early morning.  Rescheduled for May 6, and edited her wait list for sooner if one opens up with NP or Dr Jannifer Franklin. [name] verbalized understanding, appreciation.

## 2017-12-18 NOTE — Telephone Encounter (Signed)
Pt brother(on [facility]) has called stating pt condition has worsen re: memory and ability to dress herself.  Brother has accepted 1st available appointment 07-01 with Dr Jannifer Franklin and is also on wait list, brother would like RN to contact if there is anyway to get pt seen soon as a result of her worsening.  The appointment with NP Jinny Blossom has been cancelled since pt is trying to see Dr Jannifer Franklin a lot sooner than June and July

## 2017-12-21 ENCOUNTER — Ambulatory Visit (HOSPITAL_COMMUNITY)
Admission: RE | Admit: 2017-12-21 | Discharge: 2017-12-21 | Disposition: A | Discharge status: Home / Self Care | Payer: Medicare Other | Source: Ambulatory Visit | Attending: Family | Admitting: Family

## 2017-12-21 ENCOUNTER — Encounter: Payer: Self-pay | Admitting: Family

## 2017-12-21 ENCOUNTER — Ambulatory Visit (INDEPENDENT_AMBULATORY_CARE_PROVIDER_SITE_OTHER): Payer: Medicare Other | Admitting: Family

## 2017-12-21 VITALS — BP 140/82 | HR 72 | Temp 97.2°F | Resp 17 | Ht 63.0 in | Wt 193.2 lb

## 2017-12-21 DIAGNOSIS — I6523 Occlusion and stenosis of bilateral carotid arteries: Secondary | ICD-10-CM | POA: Diagnosis not present

## 2017-12-21 DIAGNOSIS — I6529 Occlusion and stenosis of unspecified carotid artery: Secondary | ICD-10-CM | POA: Insufficient documentation

## 2017-12-21 DIAGNOSIS — E785 Hyperlipidemia, unspecified: Secondary | ICD-10-CM | POA: Diagnosis not present

## 2017-12-21 DIAGNOSIS — I10 Essential (primary) hypertension: Secondary | ICD-10-CM | POA: Diagnosis not present

## 2017-12-21 LAB — VAS US CAROTID
LCCADDIAS: 9 cm/s
LCCADSYS: 61 cm/s
LEFT ECA DIAS: -8 cm/s
LICADDIAS: -15 cm/s
LICADSYS: -70 cm/s
LICAPDIAS: 11 cm/s
LICAPSYS: 94 cm/s
Left CCA prox dias: 9 cm/s
Left CCA prox sys: 67 cm/s
RCCADSYS: -96 cm/s
RCCAPSYS: 104 cm/s
RIGHT CCA MID DIAS: 13 cm/s
RIGHT ECA DIAS: -1 cm/s
RIGHT VERTEBRAL DIAS: 11 cm/s
Right CCA prox dias: 12 cm/s

## 2017-12-21 NOTE — Progress Notes (Signed)
Chief Complaint: Follow up Extracranial Carotid Artery Stenosis   History of Present Illness  Virginia [name] is a 82 y.o. female who is status post left CEA in 2010 by Dr. Kellie Simmering. She returns today for follow up accompanied by her brother.  The patient denies any history of TIA or stroke symptoms, specifically she denies a history of amaurosis fugax or monocular blindness, unilateral facial drooping, hemiplegia, or eceptive or expressive aphasia.  The pt denies any dizziness.  The pt sees Dr. Jannifer Franklin for memory loss; his notes include the result of a CT of the head: CT head 09/18/14: IMPRESSION: Small vessel disease type changes without CT evidence of large acute infarct.  She denies tingling, numbness, pain, or cold sensation in either hand/arm. Hospitalized for pneumonia in December and January of 2015/2016.  Pt Diabetic: No Pt smoker: former smoker, quit in 2005  Pt meds include: Statin : yes ASA: Yes Other anticoagulants/antiplatelets: no     Past Medical History:  Diagnosis Date  . Arthritis   . Carotid artery disease ([place])    a. s/p L CEA. b. followed by [facility].  . Chronic diastolic CHF (congestive heart failure) ([place])   . CKD (chronic kidney disease), stage III ([place])   . COPD (chronic obstructive pulmonary disease) ([place])   . Diverticulosis   . Fall at home Sept. 2013  . Hyperlipidemia   . Hypertension   . Internal hemorrhoid   . Memory disorder 10/24/2014    Social History Social History   Tobacco Use  . Smoking status: Former Smoker    Packs/day: 0.25    Years: 20.00    Pack years: 5.00    Types: Cigarettes    Last attempt to quit: 11/30/1994    Years since quitting: 23.0  . Smokeless tobacco: Never Used  . Tobacco comment: quit  Substance Use Topics  . Alcohol use: No    Alcohol/week: 0.0 oz  . Drug use: No    Family History Family History  Problem Relation Age of Onset  . Heart disease Father        Before age 63  . Hypertension  Father   . Heart attack Father   . Cancer Mother 78       Brain  . Dementia Brother   . Cancer Maternal Aunt 61       breast cancer  . Diabetes Maternal Aunt   . Heart failure Maternal Grandmother   . Diabetes Maternal Grandmother   . Stroke Neg Hx     Surgical History Past Surgical History:  Procedure Laterality Date  . APPENDECTOMY    . BREAST REDUCTION SURGERY    . CAROTID ENDARTERECTOMY     left CEA  . CATARACT EXTRACTION Bilateral   . COLONOSCOPY  2015  . EYE SURGERY     cataracts removed, bilaterally   . skin cancer removal    . TONSILLECTOMY      Allergies  Allergen Reactions  . Lidocaine Anaphylaxis, Swelling, Rash and Other (See Comments)    Any of the " [facility] "  . Other Other (See Comments)    All drugs that end in "cane'-  novacane etc. Daughter states patient almost died when she had it when she was born    Current Outpatient Medications  Medication Sig Dispense Refill  . albuterol (PROVENTIL) (2.5 MG/3ML) 0.083% nebulizer solution Take 6 mLs (5 mg total) by nebulization every 4 (four) hours as needed for wheezing or shortness of breath. 75  mL 12  . ARTIFICIAL TEAR OP Place 2 drops into both eyes daily.     Marland Kitchen aspirin EC 81 MG tablet Take 81 mg by mouth every evening.    Marland Kitchen atorvastatin (LIPITOR) 20 MG tablet take 1 tablet by mouth once daily 90 tablet 3  . azelastine (ASTELIN) 0.1 % nasal spray Place 2 sprays into both nostrils 2 (two) times daily. Use in each nostril as directed 30 mL 5  . BREO ELLIPTA 100-25 MCG/INH AEPB inhale 1 puff INTO THE LUNGS daily 60 each 5  . BYSTOLIC 5 MG tablet take 1 tablet by mouth once daily 90 tablet 2  . Calcium Carbonate-Vitamin D (CALCIUM-D PO) Take 1 tablet by mouth every evening.    . Carbinoxamine Maleate ER [facility]) 4 MG/5ML SUER Take 7.5 mLs by mouth every 12 (twelve) hours as needed. 450 mL 5  . diclofenac sodium (VOLTAREN) 1 % GEL Apply 1 application topically 2 (two) times daily as needed (shoulder and  knee pain). 4 Tube 1  . donepezil (ARICEPT) 10 MG tablet Take 10 mg by mouth daily.   0  . fluticasone (FLONASE) 50 MCG/ACT nasal spray Place 2 sprays into both nostrils daily. 16 g 5  . furosemide (LASIX) 20 MG tablet Take 1 tablet daily, take 1 tablet daily as needed, for weight gain of 3-5 lbs 135 tablet 3  . ipratropium-albuterol (DUONEB) 0.5-2.5 (3) MG/3ML SOLN inhale contents of 1 vial in nebulizer every 6 hours 360 mL 12  . memantine (NAMENDA) 10 MG tablet Take 1 tablet (10 mg total) by mouth 2 (two) times daily. 180 tablet 3  . Multiple Vitamins-Minerals (PRESERVISION AREDS 2) CAPS Take 1 capsule by mouth 2 (two) times daily.     . ondansetron (ZOFRAN ODT) 4 MG disintegrating tablet Take 1 tablet (4 mg total) by mouth every 8 (eight) hours as needed for nausea or vomiting. 90 tablet 2  . pantoprazole (PROTONIX) 40 MG tablet Take 1 tablet (40 mg total) by mouth daily. 90 tablet 2  . ranitidine (ZANTAC) 150 MG tablet Take 150 mg by mouth daily.  0  . VAYACOG 100-19.5-6.5 MG CAPS take 1 capsule by mouth once daily 90 capsule 3   No current facility-administered medications for this visit.     Review of Systems : See HPI for pertinent positives and negatives.  Physical Examination  Vitals:   12/21/17 1537 12/21/17 1546  BP: (!) 157/84 140/82  Pulse: 72   Resp: 17   Temp: (!) 97.2 F (36.2 C)   TempSrc: Oral   SpO2: 95%   Weight: 193 lb 3.2 oz (87.6 kg)   Height: 5\' 3"  (1.6 m)    Body mass index is 34.22 kg/m.  General: WDWN obese female in NAD GAIT: normal Eyes: PERRLA HENT: No gross abnormalities. Pulmonary:Respirations are non-labored, CTAB, no rales,  rhonchi, or wheezing. Cardiac: regular rhythm and rate, no detected murmur.  VASCULAR EXAM: Carotid Bruits Right Left   Negative Negative   Abdominal aortic pulse is not palpable. Radial pulses: right is not palpable, right brachial pulse is 2+ palpable, left radial pulse is 2+  palpable.      LE Pulses Right Left   POPLITEAL not palpable  not palpable   POSTERIOR TIBIAL 2+ palpable  2+ palpable    DORSALIS PEDIS  ANTERIOR TIBIAL 1+ palpable  2+ palpable     Gastrointestinal: soft, nontender, BS WNL, no r/g, no palpable masses. Musculoskeletal: No muscle atrophy/wasting. M/S 5/5 throughout, Extremities without  ischemic changes. Skin: No rash, no cellulitis, no ulcers noted.  Neurologic: A&O X 2; appropriate affect,speech is normal, CN 2-12 intact, Pain and light touch intact in extremities, Motor exam as listed above, sensation is normal; speech is normal. Psychiatric: Normal thought content, mood appropriate to clinical situation.     Assessment: LARISHA VENCILL is a 82 y.o. female who who is status post left CEA in 2010. She has no hx of stroke or TIA.  Fortunately she does not have DM and quit smoking in 2006. She takes a daily statin and ASA.   Her atherosclerotic risk factors include stage 3 CKD, CHF, COPD, former smoker, obesity, dyslipidemia, and hypertension.    DATA Carotid Duplex (12/21/17): 1-39% bilateral ICA stenosis. Patent left CEA site.  Right vertebral artery is antegrade, left not visualized.  No significant change compared to prior exam on 11-25-16.    Plan: Follow-up in 18 months with Carotid Duplex scan to include subclavian arteries.    I discussed in depth with the patient the nature of atherosclerosis, and emphasized the importance of maximal medical management including strict control of blood pressure, blood glucose, and lipid levels, obtaining regular exercise, and continued cessation of smoking.  The patient is aware that without maximal medical management the underlying atherosclerotic disease process will progress, limiting the benefit of any  interventions. The patient was given information about stroke prevention and what symptoms should prompt the patient to seek immediate medical care. Thank you for allowing [place] to participate in this patient's care.  Clemon Chambers, RN, MSN, FNP-C [facility] Office: [facility] Physician: Trula Slade  12/21/17 4:05 PM

## 2017-12-21 NOTE — Patient Instructions (Signed)

## 2017-12-29 ENCOUNTER — Encounter: Payer: Self-pay | Admitting: Nurse Practitioner

## 2017-12-29 ENCOUNTER — Ambulatory Visit (INDEPENDENT_AMBULATORY_CARE_PROVIDER_SITE_OTHER): Payer: Medicare Other | Admitting: Nurse Practitioner

## 2017-12-29 VITALS — BP 124/82 | HR 63 | Temp 98.0°F | Ht 63.0 in | Wt 201.0 lb

## 2017-12-29 DIAGNOSIS — I6529 Occlusion and stenosis of unspecified carotid artery: Secondary | ICD-10-CM | POA: Diagnosis not present

## 2017-12-29 DIAGNOSIS — F0391 Unspecified dementia with behavioral disturbance: Secondary | ICD-10-CM

## 2017-12-29 DIAGNOSIS — R531 Weakness: Secondary | ICD-10-CM | POA: Diagnosis not present

## 2017-12-29 LAB — POCT URINALYSIS DIPSTICK
Bilirubin, UA: NEGATIVE
Glucose, UA: NEGATIVE
Ketones, UA: NEGATIVE
LEUKOCYTES UA: NEGATIVE
NITRITE UA: NEGATIVE
PROTEIN UA: NEGATIVE
RBC UA: NEGATIVE
SPEC GRAV UA: 1.01 (ref 1.010–1.025)
UROBILINOGEN UA: NEGATIVE U/dL — AB
pH, UA: 5 (ref 5.0–8.0)

## 2017-12-29 NOTE — Progress Notes (Signed)
Careteam: Patient Care Team: Lauree Chandler, NP as PCP - General (Nurse Practitioner) Fay Records, MD as Consulting Physician (Cardiology) Tanda Rockers, MD as Consulting Physician (Pulmonary Disease)  Advanced Directive information    Allergies  Allergen Reactions  . Lidocaine Anaphylaxis, Swelling, Rash and Other (See Comments)    Any of the " [facility] "  . Other Other (See Comments)    All drugs that end in "cane'-  novacane etc. Daughter states patient almost died when she had it when she was born    Chief Complaint  Patient presents with  . Acute Visit    Pt is being seen due to severe fatigue. Family wonders if pt has possible UTI.      HPI: Patient is a 82 y.o. female seen in the office today due to family concerns. Brother reports family is concerned over increase fatigued. She is sleeping a lot more. Pt reports she is bored.  States she is sleeping at night.  Brother also noted increase in behaviors.  Possible due to progression of dementia but wants to make sure there is no other medical things going on.  Brother reports she is more short of breath however she is more sedentary. She denies shortness of breath or swelling No cough. No congestion.  Pt reports she does not know why she is even here, states she is fine.  No fevers   Review of Systems: pt reports ROS all negative when asked however limited due to dementia.  Review of Systems  Unable to perform ROS: Dementia    Past Medical History:  Diagnosis Date  . Arthritis   . Carotid artery disease ([place])    a. s/p L CEA. b. followed by [facility].  . Chronic diastolic CHF (congestive heart failure) ([place])   . CKD (chronic kidney disease), stage III ([place])   . COPD (chronic obstructive pulmonary disease) ([place])   . Diverticulosis   . Fall at home Sept. 2013  . Hyperlipidemia   . Hypertension   . Internal hemorrhoid   . Memory disorder 10/24/2014   Past Surgical History:  Procedure Laterality  Date  . APPENDECTOMY    . BREAST REDUCTION SURGERY    . CAROTID ENDARTERECTOMY     left CEA  . CATARACT EXTRACTION Bilateral   . COLONOSCOPY  2015  . EYE SURGERY     cataracts removed, bilaterally   . skin cancer removal    . TONSILLECTOMY     Social History:   reports that she quit smoking about 23 years ago. Her smoking use included cigarettes. She has a 5.00 pack-year smoking history. she has never used smokeless tobacco. She reports that she does not drink alcohol or use drugs.  Family History  Problem Relation Age of Onset  . Heart disease Father        Before age 6  . Hypertension Father   . Heart attack Father   . Cancer Mother 21       Brain  . Dementia Brother   . Cancer Maternal Aunt 61       breast cancer  . Diabetes Maternal Aunt   . Heart failure Maternal Grandmother   . Diabetes Maternal Grandmother   . Stroke Neg Hx     Medications: Patient's Medications  New Prescriptions   No medications on file  Previous Medications   ALBUTEROL (PROVENTIL) (2.5 MG/3ML) 0.083% NEBULIZER SOLUTION    Take 6 mLs (5 mg total) by nebulization  every 4 (four) hours as needed for wheezing or shortness of breath.   ARTIFICIAL TEAR OP    Place 2 drops into both eyes daily.    ASPIRIN EC 81 MG TABLET    Take 81 mg by mouth every evening.   ATORVASTATIN (LIPITOR) 20 MG TABLET    take 1 tablet by mouth once daily   AZELASTINE (ASTELIN) 0.1 % NASAL SPRAY    Place 2 sprays into both nostrils 2 (two) times daily. Use in each nostril as directed   BREO ELLIPTA 100-25 MCG/INH AEPB    inhale 1 puff INTO THE LUNGS daily   BYSTOLIC 5 MG TABLET    take 1 tablet by mouth once daily   CALCIUM CARBONATE-VITAMIN D (CALCIUM-D PO)    Take 1 tablet by mouth every evening.   CARBINOXAMINE MALEATE ER [facility]) 4 MG/5ML SUER    Take 7.5 mLs by mouth every 12 (twelve) hours as needed.   DICLOFENAC SODIUM (VOLTAREN) 1 % GEL    Apply 1 application topically 2 (two) times daily as needed (shoulder and  knee pain).   DONEPEZIL (ARICEPT) 10 MG TABLET    Take 10 mg by mouth daily.    FLUTICASONE (FLONASE) 50 MCG/ACT NASAL SPRAY    Place 2 sprays into both nostrils daily.   FUROSEMIDE (LASIX) 20 MG TABLET    Take 1 tablet daily, take 1 tablet daily as needed, for weight gain of 3-5 lbs   IPRATROPIUM-ALBUTEROL (DUONEB) 0.5-2.5 (3) MG/3ML SOLN    inhale contents of 1 vial in nebulizer every 6 hours   MEMANTINE (NAMENDA) 10 MG TABLET    Take 1 tablet (10 mg total) by mouth 2 (two) times daily.   MULTIPLE VITAMINS-MINERALS (PRESERVISION AREDS 2) CAPS    Take 1 capsule by mouth 2 (two) times daily.    ONDANSETRON (ZOFRAN ODT) 4 MG DISINTEGRATING TABLET    Take 1 tablet (4 mg total) by mouth every 8 (eight) hours as needed for nausea or vomiting.   PANTOPRAZOLE (PROTONIX) 40 MG TABLET    Take 1 tablet (40 mg total) by mouth daily.   RANITIDINE (ZANTAC) 150 MG TABLET    Take 150 mg by mouth daily.   VAYACOG 100-19.5-6.5 MG CAPS    take 1 capsule by mouth once daily  Modified Medications   No medications on file  Discontinued Medications   No medications on file     Physical Exam:  Vitals:   12/29/17 1431  BP: 124/82  Pulse: 63  Temp: 98 F (36.7 C)  TempSrc: Oral  SpO2: 97%  Weight: 201 lb (91.2 kg)  Height: 5\' 3"  (1.6 m)   Body mass index is 35.61 kg/m.  Physical Exam  Constitutional: She appears well-developed and well-nourished.  HENT:  Head: Normocephalic and atraumatic.  Right Ear: External ear normal.  Left Ear: External ear normal.  Nose: Nose normal.  Mouth/Throat: Oropharynx is clear and moist. No oropharyngeal exudate.  Eyes: Conjunctivae and EOM are normal. Pupils are equal, round, and reactive to light.  Neck: Normal range of motion. Neck supple.  Cardiovascular: Normal rate, regular rhythm and normal heart sounds.  Pulmonary/Chest: Effort normal and breath sounds normal. She has no wheezes.  Abdominal: Soft. Bowel sounds are normal.  Musculoskeletal: She exhibits no  edema.  varicosities to lower extremities  Neurological: She is alert.  Skin: Skin is warm and dry.  Psychiatric: Her affect is blunt. Cognition and memory are impaired. She exhibits abnormal remote memory.  Labs reviewed: Basic Metabolic Panel: Recent Labs    02/01/17 1857  07/20/17 1421 08/13/17 1335 11/27/17 1221 12/10/17 1349  NA  --    < > 143  --  141 143  K  --    < > 3.8  --  4.3 4.7  CL  --    < > 98  --  104 105  CO2  --    < > 28  --  24 25  GLUCOSE  --    < > 106*  --  131* 80  BUN  --    < > 20  --  16 22  CREATININE  --    < > 1.18*  --  0.93 1.29*  CALCIUM  --    < > 8.6*  --  8.7 9.2  MG 2.1  --   --   --   --   --   PHOS 3.0  --   --   --   --   --   TSH  --   --   --  0.888  --   --    < > = values in this interval not displayed.   Liver Function Tests: Recent Labs    02/01/17 1450 02/02/17 0511 02/16/17 1457 12/10/17 1349  AST 24 21 23 26   ALT 16 16 15 17   ALKPHOS 60 50 59  --   BILITOT 1.1 0.7 0.8 1.3*  PROT 5.9* 5.7* 5.8* 6.1  ALBUMIN 3.1* 2.9* 3.5*  --    No results for input(s): LIPASE, AMYLASE in the last 8760 hours. No results for input(s): AMMONIA in the last 8760 hours. CBC: Recent Labs    02/01/17 1450  02/16/17 1457 11/27/17 1221 12/10/17 1349  WBC 9.2   < > 9.1 11.1* 11.7*  NEUTROABS 5.8  --  6,188  --  8,295*  HGB 13.3   < > 13.4 13.0 14.7  HCT 42.8   < > 42.1 40.1 45.5*  MCV 90.5   < > 88.1 89 86.8  PLT 206   < > 239 249 196   < > = values in this interval not displayed.   Lipid Panel: Recent Labs    12/10/17 1349  CHOL 204*  HDL 70  TRIG 123  CHOLHDL 2.9   TSH: Recent Labs    08/13/17 1335  TSH 0.888   A1C: Lab Results  Component Value Date   HGBA1C 5.2 12/10/2017     Assessment/Plan 1. Weakness -weakness and fatigue per family however at visit patient seems at baseline, very attentive, answering questions appropriately.  Does not wish for labs here, will get them at another lab.  -CBC and BMP  ordered - POCT urinalysis dipstick-normal.   2. Dementia with behavioral disturbance, unspecified dementia type Most likely lack of interest and fatigue due to worsening dementia. Encouraged to cont activities and exercise. Proper nutrition and hydration.  -to cont namzaric.   Next appt: as scheduled Isaish Alemu K. Harle Battiest  [facility] [phone] 8 am - 5 pm) [phone] (after hours)

## 2017-12-29 NOTE — Patient Instructions (Signed)
To eat proper nutrition throughout the day Good hydration Stay active and participate in activities

## 2018-01-04 ENCOUNTER — Other Ambulatory Visit: Payer: Self-pay

## 2018-01-04 MED ORDER — DONEPEZIL HCL 10 MG PO TABS: 10.0000 mg | ORAL_TABLET | Freq: Every day | ORAL | 1 refills | Status: DC

## 2018-01-08 ENCOUNTER — Other Ambulatory Visit: Payer: Self-pay | Admitting: Internal Medicine

## 2018-01-08 ENCOUNTER — Other Ambulatory Visit: Payer: Self-pay | Admitting: *Deleted

## 2018-01-08 DIAGNOSIS — I5032 Chronic diastolic (congestive) heart failure: Secondary | ICD-10-CM | POA: Diagnosis not present

## 2018-01-08 LAB — COMPREHENSIVE METABOLIC PANEL
A/G RATIO: 1.6 (ref 1.2–2.2)
ALK PHOS: 72 IU/L (ref 39–117)
ALT: 17 IU/L (ref 0–32)
AST: 19 IU/L (ref 0–40)
Albumin: 3.6 g/dL (ref 3.5–4.7)
BUN/Creatinine Ratio: 16 (ref 12–28)
BUN: 19 mg/dL (ref 8–27)
Bilirubin Total: 0.8 mg/dL (ref 0.0–1.2)
CHLORIDE: 106 mmol/L (ref 96–106)
CO2: 25 mmol/L (ref 20–29)
CREATININE: 1.17 mg/dL — AB (ref 0.57–1.00)
Calcium: 9.1 mg/dL (ref 8.7–10.3)
GFR calc Af Amer: 50 mL/min/{1.73_m2} — ABNORMAL LOW (ref >59)
GFR calc non Af Amer: 44 mL/min/{1.73_m2} — ABNORMAL LOW (ref >59)
GLOBULIN, TOTAL: 2.3 g/dL (ref 1.5–4.5)
Glucose: 112 mg/dL — ABNORMAL HIGH (ref 65–99)
POTASSIUM: 3.9 mmol/L (ref 3.5–5.2)
SODIUM: 144 mmol/L (ref 134–144)
Total Protein: 5.9 g/dL — ABNORMAL LOW (ref 6.0–8.5)

## 2018-01-09 LAB — CBC WITH DIFFERENTIAL
BASOS: 0 %
Basophils Absolute: 0 10*3/uL (ref 0.0–0.2)
EOS (ABSOLUTE): 0.1 10*3/uL (ref 0.0–0.4)
EOS: 1 %
HEMATOCRIT: 42.7 % (ref 34.0–46.6)
HEMOGLOBIN: 13.7 g/dL (ref 11.1–15.9)
IMMATURE GRANS (ABS): 0 10*3/uL (ref 0.0–0.1)
IMMATURE GRANULOCYTES: 0 %
LYMPHS: 25 %
Lymphocytes Absolute: 2.5 10*3/uL (ref 0.7–3.1)
MCH: 28.4 pg (ref 26.6–33.0)
MCHC: 32.1 g/dL (ref 31.5–35.7)
MCV: 88 fL (ref 79–97)
MONOCYTES: 10 %
MONOS ABS: 1 10*3/uL — AB (ref 0.1–0.9)
Neutrophils Absolute: 6.3 10*3/uL (ref 1.4–7.0)
Neutrophils: 64 %
RBC: 4.83 x10E6/uL (ref 3.77–5.28)
RDW: 14.4 % (ref 12.3–15.4)
WBC: 9.9 10*3/uL (ref 3.4–10.8)

## 2018-01-11 ENCOUNTER — Other Ambulatory Visit: Payer: Medicare Other

## 2018-01-16 ENCOUNTER — Ambulatory Visit (HOSPITAL_COMMUNITY)
Admission: EM | Admit: 2018-01-16 | Discharge: 2018-01-16 | Disposition: A | Discharge status: Home / Self Care | Payer: Medicare Other | Attending: Family Medicine | Admitting: Family Medicine

## 2018-01-16 ENCOUNTER — Encounter (HOSPITAL_COMMUNITY): Payer: Self-pay | Admitting: Emergency Medicine

## 2018-01-16 DIAGNOSIS — S81811A Laceration without foreign body, right lower leg, initial encounter: Secondary | ICD-10-CM

## 2018-01-16 DIAGNOSIS — Z23 Encounter for immunization: Secondary | ICD-10-CM

## 2018-01-16 DIAGNOSIS — J208 Acute bronchitis due to other specified organisms: Secondary | ICD-10-CM | POA: Diagnosis not present

## 2018-01-16 MED ORDER — BENZONATATE 100 MG PO CAPS: 200.0000 mg | ORAL_CAPSULE | Freq: Three times a day (TID) | ORAL | 0 refills | Status: DC | PRN

## 2018-01-16 MED ORDER — TETANUS-DIPHTH-ACELL PERTUSSIS 5-2.5-18.5 LF-MCG/0.5 IM SUSP
0.5000 mL | Freq: Once | INTRAMUSCULAR | Status: AC
  Administered 2018-01-16: 0.5 mL via INTRAMUSCULAR

## 2018-01-16 MED ORDER — POVIDONE-IODINE 10 % EX SOLN
CUTANEOUS | Status: AC
  Filled 2018-01-16: qty 118

## 2018-01-16 MED ORDER — TETANUS-DIPHTH-ACELL PERTUSSIS 5-2.5-18.5 LF-MCG/0.5 IM SUSP
INTRAMUSCULAR | Status: AC
  Filled 2018-01-16: qty 0.5

## 2018-01-16 MED ORDER — METHYLPREDNISOLONE 4 MG PO TBPK: ORAL_TABLET | ORAL | 0 refills | Status: DC

## 2018-01-16 MED ORDER — AZITHROMYCIN 250 MG PO TABS: 250.0000 mg | ORAL_TABLET | Freq: Every day | ORAL | 0 refills | Status: DC

## 2018-01-16 NOTE — Discharge Instructions (Signed)
Follow up in 7 - 10 days for suture removal °

## 2018-01-16 NOTE — ED Triage Notes (Signed)
PT C/O: reports she cut her RLE yest against bed  Also reports cold sx onset yest associated w/nasal and chest congestion   A&O x4... NAD... Ambulatory

## 2018-01-16 NOTE — ED Provider Notes (Signed)
[place]   [id number] 01/16/18 Arrival Time: 2951  ASSESSMENT & PLAN:  1. Leg laceration, right, initial encounter   2. Acute bronchitis due to other specified organisms     Meds ordered this encounter  Medications  . Tdap (BOOSTRIX) injection 0.5 mL  . azithromycin (ZITHROMAX) 250 MG tablet    Sig: Take 1 tablet (250 mg total) by mouth daily. Take first 2 tablets together, then 1 every day until finished.    Dispense:  6 tablet    Refill:  0    Order Specific Question:   Supervising Provider    Answer:   Vanessa Kick [id number]  . methylPREDNISolone (MEDROL DOSEPAK) 4 MG TBPK tablet    Sig: Take 6-5-4-3-2-1 po qd    Dispense:  21 tablet    Refill:  0    Order Specific Question:   Supervising Provider    AnswerVanessa Kick [[id number]]  . DISCONTD: benzonatate (TESSALON) 100 MG capsule    Sig: Take 2 capsules (200 mg total) by mouth 3 (three) times daily as needed for cough.    Dispense:  21 capsule    Refill:  0    Order Specific Question:   Supervising Provider    Answer:   Vanessa Kick [id number]  . benzonatate (TESSALON) 100 MG capsule    Sig: Take 2 capsules (200 mg total) by mouth 3 (three) times daily as needed for cough.    Dispense:  21 capsule    Refill:  0    Order Specific Question:   Supervising Provider    Answer:   Vanessa Kick [[id number]]    Reviewed expectations re: course of current medical issues. Questions answered. Outlined signs and symptoms indicating need for more acute intervention. Patient verbalized understanding. After Visit Summary given.   SUBJECTIVE: History from: patient. Virginia [name] is a 82 y.o. female who presents with complaint of persistent right leg pain from laceration. Reports abrupt onset today. Described symptoms have gradually worsened since beginning.  ROS: As per HPI.   OBJECTIVE:  Vitals:   01/16/18 1214  BP: 129/71  Pulse: 66  Resp: 20  Temp: 98.3 F (36.8 C)  TempSrc: Oral  SpO2: 94%      General appearance: alert; no distress Eyes: PERRLA; EOMI; conjunctiva normal HENT: normocephalic; atraumatic; TMs normal; nasal mucosa normal; oral mucosa normal Neck: supple  Lungs: clear to auscultation bilaterally Heart: regular rate and rhythm Abdomen: soft, non-tender; bowel sounds normal; no masses or organomegaly; no guarding or rebound tenderness Back: no CVA tenderness Extremities: no cyanosis or edema; symmetrical with no gross deformities Skin: L shaped laceration to right lower leg on medial side approx 5-6 cm Neurologic: normal gait; normal symmetric reflexes Psychological: alert and cooperative; normal mood and affect  Procedure Irrigated with NS and cleaned with betadine and lidocaine 2% with lidocaine 7 ml used to anesthetize and then one vertical mattress suture and #4 4.0 nylon sutures used to approximate  The laceration.  Dressing applied and patient tolerated well. Labs: Results for orders placed or performed in visit on 01/08/18  Comprehensive metabolic panel  Result Value Ref Range   Glucose 112 (H) 65 - 99 mg/dL   BUN 19 8 - 27 mg/dL   Creatinine, Ser 1.17 (H) 0.57 - 1.00 mg/dL   GFR calc non Af Amer 44 (L) >59 mL/min/1.73   GFR calc Af Amer 50 (L) >59 mL/min/1.73   BUN/Creatinine Ratio 16 12 - 28   Sodium 144 134 -  144 mmol/L   Potassium 3.9 3.5 - 5.2 mmol/L   Chloride 106 96 - 106 mmol/L   CO2 25 20 - 29 mmol/L   Calcium 9.1 8.7 - 10.3 mg/dL   Total Protein 5.9 (L) 6.0 - 8.5 g/dL   Albumin 3.6 3.5 - 4.7 g/dL   Globulin, Total 2.3 1.5 - 4.5 g/dL   Albumin/Globulin Ratio 1.6 1.2 - 2.2   Bilirubin Total 0.8 0.0 - 1.2 mg/dL   Alkaline Phosphatase 72 39 - 117 IU/L   AST 19 0 - 40 IU/L   ALT 17 0 - 32 IU/L   Labs Reviewed - No data to display  Imaging: No results found.  Allergies  Allergen Reactions  . Lidocaine Anaphylaxis, Swelling, Rash and Other (See Comments)    Any of the " [facility] "  . Other Other (See Comments)    All drugs that  end in "cane'-  novacane etc. Daughter states patient almost died when she had it when she was born    Past Medical History:  Diagnosis Date  . Arthritis   . Carotid artery disease ([place])    a. s/p L CEA. b. followed by [facility].  . Chronic diastolic CHF (congestive heart failure) ([place])   . CKD (chronic kidney disease), stage III ([place])   . COPD (chronic obstructive pulmonary disease) ([place])   . Diverticulosis   . Fall at home Sept. 2013  . Hyperlipidemia   . Hypertension   . Internal hemorrhoid   . Memory disorder 10/24/2014   Social History   Socioeconomic History  . Marital status: Divorced    Spouse name: Not on file  . Number of children: 2  . Years of education: 7  . Highest education level: Not on file  Social Needs  . Financial resource strain: Not on file  . Food insecurity - worry: Not on file  . Food insecurity - inability: Not on file  . Transportation needs - medical: Not on file  . Transportation needs - non-medical: Not on file  Occupational History  . Occupation: retired  Tobacco Use  . Smoking status: Former Smoker    Packs/day: 0.25    Years: 20.00    Pack years: 5.00    Types: Cigarettes    Last attempt to quit: 11/30/1994    Years since quitting: 23.1  . Smokeless tobacco: Never Used  . Tobacco comment: quit  Substance and Sexual Activity  . Alcohol use: No    Alcohol/week: 0.0 oz  . Drug use: No  . Sexual activity: No    Birth control/protection: Post-menopausal  Other Topics Concern  . Not on file  Social History Narrative   Diet:       Do you drink/eat things with caffeine: yes      Marital Status: Divorced   What Year Married:1959      Do you live in a house, apartment, Assisted Living, Condo, trailer? House      Is it one or more stories? 2 stories      How many persons live in your home? Just Patient      Do you have any pets in your home? 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 Very Little   Type  and how often? Walk      Do you have a living will? None   DNR?   Discuss one? No  Do you have signed POA/HPOA forms?      Patient drinks 1 cup of caffeine daily.   Patient is right handed.   Family History  Problem Relation Age of Onset  . Heart disease Father        Before age 80  . Hypertension Father   . Heart attack Father   . Cancer Mother 30       Brain  . Dementia Brother   . Cancer Maternal Aunt 61       breast cancer  . Diabetes Maternal Aunt   . Heart failure Maternal Grandmother   . Diabetes Maternal Grandmother   . Stroke Neg Hx    Past Surgical History:  Procedure Laterality Date  . APPENDECTOMY    . BREAST REDUCTION SURGERY    . CAROTID ENDARTERECTOMY     left CEA  . CATARACT EXTRACTION Bilateral   . COLONOSCOPY  2015  . EYE SURGERY     cataracts removed, bilaterally   . skin cancer removal    . TONSILLECTOMY       Lysbeth Penner, FNP 01/16/18 2113

## 2018-01-18 ENCOUNTER — Other Ambulatory Visit: Payer: Self-pay | Admitting: *Deleted

## 2018-01-18 ENCOUNTER — Encounter: Payer: Self-pay | Admitting: Nurse Practitioner

## 2018-01-18 ENCOUNTER — Ambulatory Visit: Payer: Self-pay | Admitting: Nurse Practitioner

## 2018-01-18 ENCOUNTER — Ambulatory Visit (INDEPENDENT_AMBULATORY_CARE_PROVIDER_SITE_OTHER): Payer: Medicare Other | Admitting: Nurse Practitioner

## 2018-01-18 VITALS — BP 132/78 | HR 72 | Temp 98.3°F | Ht 63.0 in | Wt 199.0 lb

## 2018-01-18 DIAGNOSIS — J440 Chronic obstructive pulmonary disease with acute lower respiratory infection: Secondary | ICD-10-CM | POA: Diagnosis not present

## 2018-01-18 DIAGNOSIS — W19XXXD Unspecified fall, subsequent encounter: Secondary | ICD-10-CM | POA: Diagnosis not present

## 2018-01-18 DIAGNOSIS — J209 Acute bronchitis, unspecified: Secondary | ICD-10-CM | POA: Diagnosis not present

## 2018-01-18 DIAGNOSIS — S81811D Laceration without foreign body, right lower leg, subsequent encounter: Secondary | ICD-10-CM | POA: Diagnosis not present

## 2018-01-18 DIAGNOSIS — I6529 Occlusion and stenosis of unspecified carotid artery: Secondary | ICD-10-CM

## 2018-01-18 MED ORDER — MEMANTINE HCL 10 MG PO TABS: 10.0000 mg | ORAL_TABLET | Freq: Two times a day (BID) | ORAL | 3 refills | Status: DC

## 2018-01-18 NOTE — Patient Instructions (Addendum)
To use tylenol 325 mg 2 tablets every 6 hours as needed for pain   Continue azithromycin, prednisone and tessalon pearls per urgent care ADD Mucinex DM by mouth twice daily with full glass of water for 7 days   Clean laceration normally, change bandage daily and as needed

## 2018-01-18 NOTE — Progress Notes (Signed)
Careteam: Patient Care Team: Lauree Chandler, NP as PCP - General (Nurse Practitioner) Fay Records, MD as Consulting Physician (Cardiology) Tanda Rockers, MD as Consulting Physician (Pulmonary Disease)  Advanced Directive information    Allergies  Allergen Reactions  . Lidocaine Anaphylaxis, Swelling, Rash and Other (See Comments)    Any of the " [facility] "  . Other Other (See Comments)    All drugs that end in "cane'-  novacane etc. Daughter states patient almost died when she had it when she was born    Chief Complaint  Patient presents with  . Acute Visit    Pt is being seen due SOB, URI, and right leg laceration.   . Other    Brother in room     HPI: Patient is a 82 y.o. female seen in the office today to follow up urgent care.   Pt was seen at urgent care 2 days ago due to URI and was prescribed azithromycin, benzonatate and prednisone. Pt reports she does not feel bad just has a cough. Started about 2 days ago.  Reports she originally was seen at urgent care due to laceration on leg which no one is sure when it occurred but noticed blood on her sheet on 2 days ago. Reports she slide out of the bed the night before but no injury noted by her or family when it happened. Went to urgent care and got 4 stitches to follow up in 1 week there or here for removal.  Reports she feels sore today and some bruising noted to bilateral hands. Still able to move around just takes her a little longer. Overall aching is better.   Review of Systems:  Review of Systems  Unable to perform ROS: Dementia    Past Medical History:  Diagnosis Date  . Arthritis   . Carotid artery disease ([place])    a. s/p L CEA. b. followed by [facility].  . Chronic diastolic CHF (congestive heart failure) ([place])   . CKD (chronic kidney disease), stage III ([place])   . COPD (chronic obstructive pulmonary disease) ([place])   . Diverticulosis   . Fall at home Sept. 2013  . Hyperlipidemia   . Hypertension    . Internal hemorrhoid   . Memory disorder 10/24/2014   Past Surgical History:  Procedure Laterality Date  . APPENDECTOMY    . BREAST REDUCTION SURGERY    . CAROTID ENDARTERECTOMY     left CEA  . CATARACT EXTRACTION Bilateral   . COLONOSCOPY  2015  . EYE SURGERY     cataracts removed, bilaterally   . skin cancer removal    . TONSILLECTOMY     Social History:   reports that she quit smoking about 23 years ago. Her smoking use included cigarettes. She has a 5.00 pack-year smoking history. she has never used smokeless tobacco. She reports that she does not drink alcohol or use drugs.  Family History  Problem Relation Age of Onset  . Heart disease Father        Before age 51  . Hypertension Father   . Heart attack Father   . Cancer Mother 69       Brain  . Dementia Brother   . Cancer Maternal Aunt 61       breast cancer  . Diabetes Maternal Aunt   . Heart failure Maternal Grandmother   . Diabetes Maternal Grandmother   . Stroke Neg Hx     Medications:  Patient's Medications  New Prescriptions   No medications on file  Previous Medications   ALBUTEROL (PROVENTIL) (2.5 MG/3ML) 0.083% NEBULIZER SOLUTION    Take 6 mLs (5 mg total) by nebulization every 4 (four) hours as needed for wheezing or shortness of breath.   ARTIFICIAL TEAR OP    Place 2 drops into both eyes daily.    ASPIRIN EC 81 MG TABLET    Take 81 mg by mouth every evening.   ATORVASTATIN (LIPITOR) 20 MG TABLET    take 1 tablet by mouth once daily   AZELASTINE (ASTELIN) 0.1 % NASAL SPRAY    Place 2 sprays into both nostrils 2 (two) times daily. Use in each nostril as directed   AZITHROMYCIN (ZITHROMAX) 250 MG TABLET    Take 1 tablet (250 mg total) by mouth daily. Take first 2 tablets together, then 1 every day until finished.   BENZONATATE (TESSALON) 100 MG CAPSULE    Take 2 capsules (200 mg total) by mouth 3 (three) times daily as needed for cough.   BREO ELLIPTA 100-25 MCG/INH AEPB    inhale 1 puff INTO THE  LUNGS daily   BYSTOLIC 5 MG TABLET    take 1 tablet by mouth once daily   CALCIUM CARBONATE-VITAMIN D (CALCIUM-D PO)    Take 1 tablet by mouth every evening.   CARBINOXAMINE MALEATE ER [facility] ER) 4 MG/5ML SUER    Take 7.5 mLs by mouth every 12 (twelve) hours as needed.   DICLOFENAC SODIUM (VOLTAREN) 1 % GEL    Apply 1 application topically 2 (two) times daily as needed (shoulder and knee pain).   DONEPEZIL (ARICEPT) 10 MG TABLET    Take 1 tablet (10 mg total) by mouth daily.   FLUTICASONE (FLONASE) 50 MCG/ACT NASAL SPRAY    Place 2 sprays into both nostrils daily.   FUROSEMIDE (LASIX) 20 MG TABLET    Take 1 tablet daily, take 1 tablet daily as needed, for weight gain of 3-5 lbs   IPRATROPIUM-ALBUTEROL (DUONEB) 0.5-2.5 (3) MG/3ML SOLN    inhale contents of 1 vial in nebulizer every 6 hours   MEMANTINE (NAMENDA) 10 MG TABLET    Take 1 tablet (10 mg total) by mouth 2 (two) times daily.   METHYLPREDNISOLONE (MEDROL DOSEPAK) 4 MG TBPK TABLET    Take 6-5-4-3-2-1 po qd   MULTIPLE VITAMINS-MINERALS (PRESERVISION AREDS 2) CAPS    Take 1 capsule by mouth 2 (two) times daily.    ONDANSETRON (ZOFRAN ODT) 4 MG DISINTEGRATING TABLET    Take 1 tablet (4 mg total) by mouth every 8 (eight) hours as needed for nausea or vomiting.   PANTOPRAZOLE (PROTONIX) 40 MG TABLET    Take 1 tablet (40 mg total) by mouth daily.   RANITIDINE (ZANTAC) 150 MG TABLET    Take 150 mg by mouth daily.   VAYACOG 100-19.5-6.5 MG CAPS    take 1 capsule by mouth once daily  Modified Medications   No medications on file  Discontinued Medications   No medications on file     Physical Exam:  Vitals:   01/18/18 1545  BP: 132/78  Pulse: 72  Temp: 98.3 F (36.8 C)  TempSrc: Oral  SpO2: 95%  Weight: 199 lb (90.3 kg)  Height: 5\' 3"  (1.6 m)   Body mass index is 35.25 kg/m.  Physical Exam  Constitutional: She appears well-developed and well-nourished.  HENT:  Head: Normocephalic and atraumatic.  Right Ear: External ear  normal.  Left Ear: External ear  normal.  Nose: Nose normal.  Mouth/Throat: Oropharynx is clear and moist. No oropharyngeal exudate.  Eyes: Conjunctivae and EOM are normal. Pupils are equal, round, and reactive to light.  Neck: Normal range of motion. Neck supple.  Cardiovascular: Normal rate, regular rhythm and normal heart sounds.  Pulmonary/Chest: Effort normal and breath sounds normal. She has no wheezes.  Abdominal: Soft. Bowel sounds are normal.  Musculoskeletal: She exhibits no edema.  varicosities to lower extremities  Neurological: She is alert.  Skin: Skin is warm and dry.  Laceration noted to posterior right lower leg. ecchymosis surrounding area, no erythema or drainage noted  Psychiatric: Her affect is blunt. Cognition and memory are impaired. She exhibits abnormal remote memory.    Labs reviewed: Basic Metabolic Panel: Recent Labs    02/01/17 1857  08/13/17 1335 11/27/17 1221 12/10/17 1349 01/08/18 1150  NA  --    < >  --  141 143 144  K  --    < >  --  4.3 4.7 3.9  CL  --    < >  --  104 105 106  CO2  --    < >  --  24 25 25   GLUCOSE  --    < >  --  131* 80 112*  BUN  --    < >  --  16 22 19   CREATININE  --    < >  --  0.93 1.29* 1.17*  CALCIUM  --    < >  --  8.7 9.2 9.1  MG 2.1  --   --   --   --   --   PHOS 3.0  --   --   --   --   --   TSH  --   --  0.888  --   --   --    < > = values in this interval not displayed.   Liver Function Tests: Recent Labs    02/02/17 0511 02/16/17 1457 12/10/17 1349 01/08/18 1150  AST 21 23 26 19   ALT 16 15 17 17   ALKPHOS 50 59  --  72  BILITOT 0.7 0.8 1.3* 0.8  PROT 5.7* 5.8* 6.1 5.9*  ALBUMIN 2.9* 3.5*  --  3.6   No results for input(s): LIPASE, AMYLASE in the last 8760 hours. No results for input(s): AMMONIA in the last 8760 hours. CBC: Recent Labs    02/16/17 1457 11/27/17 1221 12/10/17 1349 01/08/18 1150  WBC 9.1 11.1* 11.7* 9.9  NEUTROABS 6,188  --  8,295* 6.3  HGB 13.4 13.0 14.7 13.7  HCT 42.1 40.1  45.5* 42.7  MCV 88.1 89 86.8 88  PLT 239 249 196  --    Lipid Panel: Recent Labs    12/10/17 1349  CHOL 204*  HDL 70  TRIG 123  CHOLHDL 2.9   TSH: Recent Labs    08/13/17 1335  TSH 0.888   A1C: Lab Results  Component Value Date   HGBA1C 5.2 12/10/2017     Assessment/Plan 1. Acute bronchitis with COPD ([place]) -doing well with azithromycin, prednisone, tessalon pearls as needed -to start mucinex DM by mouth twice daily for 7 days -cont albuterol as needed  2. Laceration of right lower leg, subsequent encounter -sutured at urgent care, change dressing at least daily -to use mild soap to cleanse daily -to follow up in office for suture removal  3. Fall -family working on getting her a lower bed to avoid additional falls -  to use tylenol 650 mg by mouth every 6 hours as needed pain   Next appt: 01/25/2018 Carlos American. Harle Battiest  [facility] [phone] 8 am - 5 pm) [phone] (after hours)

## 2018-01-25 ENCOUNTER — Encounter: Payer: Self-pay | Admitting: Nurse Practitioner

## 2018-01-25 ENCOUNTER — Ambulatory Visit (INDEPENDENT_AMBULATORY_CARE_PROVIDER_SITE_OTHER): Payer: Medicare Other | Admitting: Nurse Practitioner

## 2018-01-25 VITALS — BP 136/74 | HR 61 | Temp 98.4°F | Ht 63.0 in | Wt 192.0 lb

## 2018-01-25 DIAGNOSIS — L03115 Cellulitis of right lower limb: Secondary | ICD-10-CM | POA: Diagnosis not present

## 2018-01-25 DIAGNOSIS — Z4802 Encounter for removal of sutures: Secondary | ICD-10-CM | POA: Diagnosis not present

## 2018-01-25 DIAGNOSIS — I6529 Occlusion and stenosis of unspecified carotid artery: Secondary | ICD-10-CM

## 2018-01-25 DIAGNOSIS — S81811D Laceration without foreign body, right lower leg, subsequent encounter: Secondary | ICD-10-CM | POA: Diagnosis not present

## 2018-01-25 DIAGNOSIS — J40 Bronchitis, not specified as acute or chronic: Secondary | ICD-10-CM

## 2018-01-25 MED ORDER — DOXYCYCLINE HYCLATE 100 MG PO TABS: 100.0000 mg | ORAL_TABLET | Freq: Two times a day (BID) | ORAL | 0 refills | Status: DC

## 2018-01-25 NOTE — Patient Instructions (Addendum)
To take doxycycline 100 mg by mouth twice daily for skin infection To cleanse area with soap and water twice daily To notify if redness becomes worse, drainage or pain occurs   To continue to use mucinex DM by mouth twice daily as needed cough and congestion  Continue to use inhalers and nebulizer as needed

## 2018-01-25 NOTE — Progress Notes (Signed)
Careteam: Patient Care Team: Lauree Chandler, NP as PCP - General (Nurse Practitioner) Fay Records, MD as Consulting Physician (Cardiology) Tanda Rockers, MD as Consulting Physician (Pulmonary Disease)  Advanced Directive information    Allergies  Allergen Reactions  . Lidocaine Anaphylaxis, Swelling, Rash and Other (See Comments)    Any of the " [facility] "  . Other Other (See Comments)    All drugs that end in "cane'-  novacane etc. Daughter states patient almost died when she had it when she was born    Chief Complaint  Patient presents with  . Follow-up    Pt is being seen to have stitch removed from back of right leg. Pt has 1 stitch.   . Other    Patient's brother in room     HPI: Patient is a 82 y.o. female seen in the office today for suture removal. Pt was seen on 01/16/18 at urgent care after fall and laceration to posterior right leg. 4 sutures were placed and needing to be removed today.  caregiver/brother reports pain and itching to site. Slight redness noted by caregiver. Small amount of green drainage noted yesterday.   Continues to have cough and congestion after acute bronchitis.  Completed prednisone and azithromycin. Also had benzonatate.  Using nebulizer PRN. Overall this is much better  Review of Systems:  Review of Systems  Unable to perform ROS: Dementia    Past Medical History:  Diagnosis Date  . Arthritis   . Carotid artery disease ([place])    a. s/p L CEA. b. followed by [facility].  . Chronic diastolic CHF (congestive heart failure) ([place])   . CKD (chronic kidney disease), stage III ([place])   . COPD (chronic obstructive pulmonary disease) ([place])   . Diverticulosis   . Fall at home Sept. 2013  . Hyperlipidemia   . Hypertension   . Internal hemorrhoid   . Memory disorder 10/24/2014   Past Surgical History:  Procedure Laterality Date  . APPENDECTOMY    . BREAST REDUCTION SURGERY    . CAROTID ENDARTERECTOMY     left CEA  . CATARACT  EXTRACTION Bilateral   . COLONOSCOPY  2015  . EYE SURGERY     cataracts removed, bilaterally   . skin cancer removal    . TONSILLECTOMY     Social History:   reports that she quit smoking about 23 years ago. Her smoking use included cigarettes. She has a 5.00 pack-year smoking history. she has never used smokeless tobacco. She reports that she does not drink alcohol or use drugs.  Family History  Problem Relation Age of Onset  . Heart disease Father        Before age 75  . Hypertension Father   . Heart attack Father   . Cancer Mother 76       Brain  . Dementia Brother   . Cancer Maternal Aunt 61       breast cancer  . Diabetes Maternal Aunt   . Heart failure Maternal Grandmother   . Diabetes Maternal Grandmother   . Stroke Neg Hx     Medications: Patient's Medications  New Prescriptions   No medications on file  Previous Medications   ALBUTEROL (PROVENTIL) (2.5 MG/3ML) 0.083% NEBULIZER SOLUTION    Take 6 mLs (5 mg total) by nebulization every 4 (four) hours as needed for wheezing or shortness of breath.   ARTIFICIAL TEAR OP    Place 2 drops into both eyes  daily.    ASPIRIN EC 81 MG TABLET    Take 81 mg by mouth every evening.   ATORVASTATIN (LIPITOR) 20 MG TABLET    take 1 tablet by mouth once daily   AZELASTINE (ASTELIN) 0.1 % NASAL SPRAY    Place 2 sprays into both nostrils 2 (two) times daily. Use in each nostril as directed   BREO ELLIPTA 100-25 MCG/INH AEPB    inhale 1 puff INTO THE LUNGS daily   BYSTOLIC 5 MG TABLET    take 1 tablet by mouth once daily   CALCIUM CARBONATE-VITAMIN D (CALCIUM-D PO)    Take 1 tablet by mouth every evening.   CARBINOXAMINE MALEATE ER [facility] ER) 4 MG/5ML SUER    Take 7.5 mLs by mouth every 12 (twelve) hours as needed.   DICLOFENAC SODIUM (VOLTAREN) 1 % GEL    Apply 1 application topically 2 (two) times daily as needed (shoulder and knee pain).   DONEPEZIL (ARICEPT) 10 MG TABLET    Take 1 tablet (10 mg total) by mouth daily.    FLUTICASONE (FLONASE) 50 MCG/ACT NASAL SPRAY    Place 2 sprays into both nostrils daily.   FUROSEMIDE (LASIX) 20 MG TABLET    Take 1 tablet daily, take 1 tablet daily as needed, for weight gain of 3-5 lbs   IPRATROPIUM-ALBUTEROL (DUONEB) 0.5-2.5 (3) MG/3ML SOLN    inhale contents of 1 vial in nebulizer every 6 hours   MEMANTINE (NAMENDA) 10 MG TABLET    Take 1 tablet (10 mg total) by mouth 2 (two) times daily.   MULTIPLE VITAMINS-MINERALS (PRESERVISION AREDS 2) CAPS    Take 1 capsule by mouth 2 (two) times daily.    ONDANSETRON (ZOFRAN ODT) 4 MG DISINTEGRATING TABLET    Take 1 tablet (4 mg total) by mouth every 8 (eight) hours as needed for nausea or vomiting.   PANTOPRAZOLE (PROTONIX) 40 MG TABLET    Take 1 tablet (40 mg total) by mouth daily.   RANITIDINE (ZANTAC) 150 MG TABLET    Take 150 mg by mouth daily.   VAYACOG 100-19.5-6.5 MG CAPS    take 1 capsule by mouth once daily  Modified Medications   No medications on file  Discontinued Medications   AZITHROMYCIN (ZITHROMAX) 250 MG TABLET    Take 1 tablet (250 mg total) by mouth daily. Take first 2 tablets together, then 1 every day until finished.   BENZONATATE (TESSALON) 100 MG CAPSULE    Take 2 capsules (200 mg total) by mouth 3 (three) times daily as needed for cough.   METHYLPREDNISOLONE (MEDROL DOSEPAK) 4 MG TBPK TABLET    Take 6-5-4-3-2-1 po qd     Physical Exam:  Vitals:   01/25/18 1514  BP: 136/74  Pulse: 61  Temp: 98.4 F (36.9 C)  TempSrc: Oral  SpO2: 97%  Weight: 192 lb (87.1 kg)  Height: 5\' 3"  (1.6 m)   Body mass index is 34.01 kg/m.  Physical Exam  Constitutional: She appears well-developed and well-nourished.  HENT:  Head: Normocephalic and atraumatic.  Mouth/Throat: No oropharyngeal exudate.  Eyes: Conjunctivae and EOM are normal. Pupils are equal, round, and reactive to light.  Neck: Normal range of motion. Neck supple.  Cardiovascular: Normal rate, regular rhythm and normal heart sounds.  Pulmonary/Chest:  Effort normal and breath sounds normal. She has no wheezes.  Abdominal: Soft. Bowel sounds are normal.  Musculoskeletal: She exhibits no edema.  varicosities to lower extremities  Neurological: She is alert.  Skin: Skin is  warm and dry.  Laceration noted to posterior right lower leg. erythema and tenderness  surrounding area, no drainage noted today.   Psychiatric: Her affect is blunt. Cognition and memory are impaired. She exhibits abnormal remote memory.    Labs reviewed: Basic Metabolic Panel: Recent Labs    02/01/17 1857  08/13/17 1335 11/27/17 1221 12/10/17 1349 01/08/18 1150  NA  --    < >  --  141 143 144  K  --    < >  --  4.3 4.7 3.9  CL  --    < >  --  104 105 106  CO2  --    < >  --  24 25 25   GLUCOSE  --    < >  --  131* 80 112*  BUN  --    < >  --  16 22 19   CREATININE  --    < >  --  0.93 1.29* 1.17*  CALCIUM  --    < >  --  8.7 9.2 9.1  MG 2.1  --   --   --   --   --   PHOS 3.0  --   --   --   --   --   TSH  --   --  0.888  --   --   --    < > = values in this interval not displayed.   Liver Function Tests: Recent Labs    02/02/17 0511 02/16/17 1457 12/10/17 1349 01/08/18 1150  AST 21 23 26 19   ALT 16 15 17 17   ALKPHOS 50 59  --  72  BILITOT 0.7 0.8 1.3* 0.8  PROT 5.7* 5.8* 6.1 5.9*  ALBUMIN 2.9* 3.5*  --  3.6   No results for input(s): LIPASE, AMYLASE in the last 8760 hours. No results for input(s): AMMONIA in the last 8760 hours. CBC: Recent Labs    02/16/17 1457 11/27/17 1221 12/10/17 1349 01/08/18 1150  WBC 9.1 11.1* 11.7* 9.9  NEUTROABS 6,188  --  8,295* 6.3  HGB 13.4 13.0 14.7 13.7  HCT 42.1 40.1 45.5* 42.7  MCV 88.1 89 86.8 88  PLT 239 249 196  --    Lipid Panel: Recent Labs    12/10/17 1349  CHOL 204*  HDL 70  TRIG 123  CHOLHDL 2.9   TSH: Recent Labs    08/13/17 1335  TSH 0.888   A1C: Lab Results  Component Value Date   HGBA1C 5.2 12/10/2017     Assessment/Plan 1. Laceration of right lower extremity,  subsequent encounter Sutures removed and pt tolerated but with increase tenderness noted.  -due to redness with drainage noted will start on doxycycline for 7 days.  -to cleanse with soap and water daily -to monitor closely and notify for worsening of symptoms.  - doxycycline (VIBRA-TABS) 100 MG tablet; Take 1 tablet (100 mg total) by mouth 2 (two) times daily.  Dispense: 14 tablet; Refill: 0  2. Visit for suture removal -area cleanse and 4 sutures removed.   3. Bronchitis Improved, to continue nebulizer PRN and mucinex DM by mouth twice daily PRN  4. Cellulitis  Noted after laceration, doxycycline given for 7 days, see number 1    Next appt: return precautions given, to follow up next week if any questions about how laceration and surrounding area healing.  Carlos American. Harle Battiest  [facility] [phone] 8 am - 5 pm) [phone] (after hours)

## 2018-02-08 ENCOUNTER — Other Ambulatory Visit: Payer: Self-pay | Admitting: Nurse Practitioner

## 2018-02-08 ENCOUNTER — Telehealth: Payer: Self-pay

## 2018-02-08 ENCOUNTER — Other Ambulatory Visit: Payer: Self-pay

## 2018-02-08 NOTE — Telephone Encounter (Signed)
She was placed on antibiotic hopefully she completed this, if this area has gotten worse would recommend going to urgent care if he does not feel like she can wait until tomorrow.

## 2018-02-08 NOTE — Telephone Encounter (Signed)
Patient's brother called requesting appointment today with Janett Billow. Janett Billow and Dr.Reed's schedule is booked. Patient's brother asked if patient can be worked in.  Patient had stitches removed from right foot, patient now with swelling and yellow drainage from foot. Patient denies fever or warmth to the touch. Patient's foot is sore.    Please advise if patient can be worked in today. I scheduled appointment for tomorrow.

## 2018-02-08 NOTE — Telephone Encounter (Signed)
Spoke with patient's brother, patient completed second round or antibiotics. Patient will keep appointment tomorrow, if symptoms progress patient will seek medical attention at Urgent Care

## 2018-02-09 ENCOUNTER — Ambulatory Visit (INDEPENDENT_AMBULATORY_CARE_PROVIDER_SITE_OTHER): Payer: Medicare Other | Admitting: Nurse Practitioner

## 2018-02-09 ENCOUNTER — Encounter: Payer: Self-pay | Admitting: Nurse Practitioner

## 2018-02-09 ENCOUNTER — Ambulatory Visit: Payer: Medicare Other | Admitting: Adult Health

## 2018-02-09 VITALS — BP 136/84 | HR 77 | Temp 98.4°F | Ht 63.0 in | Wt 197.0 lb

## 2018-02-09 DIAGNOSIS — I6529 Occlusion and stenosis of unspecified carotid artery: Secondary | ICD-10-CM

## 2018-02-09 DIAGNOSIS — S81801A Unspecified open wound, right lower leg, initial encounter: Secondary | ICD-10-CM | POA: Diagnosis not present

## 2018-02-09 MED ORDER — COLLAGENASE 250 UNIT/GM EX OINT: 1.0000 "application " | TOPICAL_OINTMENT | Freq: Every day | CUTANEOUS | 0 refills | Status: DC

## 2018-02-09 NOTE — Progress Notes (Signed)
Careteam: Patient Care Team: Virginia Chandler, NP as PCP - General (Nurse Practitioner) Fay Records, MD as Consulting Physician (Cardiology) Tanda Rockers, MD as Consulting Physician (Pulmonary Disease)  Advanced Directive information    Allergies  Allergen Reactions  . Lidocaine Anaphylaxis, Swelling, Rash and Other (See Comments)    Any of the " [facility] "  . Other Other (See Comments)    All drugs that end in "cane'-  novacane etc. Daughter states patient almost died when she had it when she was born    Chief Complaint  Patient presents with  . Acute Visit    Pt is being seen due to swelling/drainage from lower, right leg where pt recently had stitiches removed.   Virginia [name]    Brother, Virginia [name], in room     HPI: Patient is a 82 y.o. female seen in the office today due to drainage and swelling on right lower leg where sutures were removed. Pt previously with unknown injury requiring sutures, sutures were removed and she was placed on doxycycline due to ongoing redness, drainage and tenderness. Completed antibiotic and area was beefy red then Yesterday notices swelling and purulent drainage and yellow base Today looks much better. Swelling and redness has gone down after they kept area elevated.  Overall area not healing.  No fevers or chills.   Review of Systems:  Review of Systems  Unable to perform ROS: Dementia    Past Medical History:  Diagnosis Date  . Arthritis   . Carotid artery disease ([place])    a. s/p L CEA. b. followed by [facility].  . Chronic diastolic CHF (congestive heart failure) ([place])   . CKD (chronic kidney disease), stage III ([place])   . COPD (chronic obstructive pulmonary disease) ([place])   . Diverticulosis   . Fall at home Sept. 2013  . Hyperlipidemia   . Hypertension   . Internal hemorrhoid   . Memory disorder 10/24/2014   Past Surgical History:  Procedure Laterality Date  . APPENDECTOMY    . BREAST REDUCTION SURGERY    . CAROTID  ENDARTERECTOMY     left CEA  . CATARACT EXTRACTION Bilateral   . COLONOSCOPY  2015  . EYE SURGERY     cataracts removed, bilaterally   . skin cancer removal    . TONSILLECTOMY     Social History:   reports that she quit smoking about 23 years ago. Her smoking use included cigarettes. She has a 5.00 pack-year smoking history. she has never used smokeless tobacco. She reports that she does not drink alcohol or use drugs.  Family History  Problem Relation Age of Onset  . Heart disease Father        Before age 66  . Hypertension Father   . Heart attack Father   . Cancer Mother 28       Brain  . Dementia Brother   . Cancer Maternal Aunt 61       breast cancer  . Diabetes Maternal Aunt   . Heart failure Maternal Grandmother   . Diabetes Maternal Grandmother   . Stroke Neg Hx     Medications: Patient's Medications  New Prescriptions   No medications on file  Previous Medications   ALBUTEROL (PROVENTIL) (2.5 MG/3ML) 0.083% NEBULIZER SOLUTION    Take 6 mLs (5 mg total) by nebulization every 4 (four) hours as needed for wheezing or shortness of breath.   ARTIFICIAL TEAR OP    Place 2 drops  into both eyes daily.    ASPIRIN EC 81 MG TABLET    Take 81 mg by mouth every evening.   ATORVASTATIN (LIPITOR) 20 MG TABLET    take 1 tablet by mouth once daily   AZELASTINE (ASTELIN) 0.1 % NASAL SPRAY    Place 2 sprays into both nostrils 2 (two) times daily. Use in each nostril as directed   BREO ELLIPTA 100-25 MCG/INH AEPB    inhale 1 puff INTO THE LUNGS daily   BYSTOLIC 5 MG TABLET    take 1 tablet by mouth once daily   CALCIUM CARBONATE-VITAMIN D (CALCIUM-D PO)    Take 1 tablet by mouth every evening.   CARBINOXAMINE MALEATE ER [facility] ER) 4 MG/5ML SUER    Take 7.5 mLs by mouth every 12 (twelve) hours as needed.   DICLOFENAC SODIUM (VOLTAREN) 1 % GEL    Apply 1 application topically 2 (two) times daily as needed (shoulder and knee pain).   DONEPEZIL (ARICEPT) 10 MG TABLET    Take 1 tablet  (10 mg total) by mouth daily.   FLUTICASONE (FLONASE) 50 MCG/ACT NASAL SPRAY    Place 2 sprays into both nostrils daily.   FUROSEMIDE (LASIX) 20 MG TABLET    Take 1 tablet daily, take 1 tablet daily as needed, for weight gain of 3-5 lbs   IPRATROPIUM (ATROVENT) 0.06 % NASAL SPRAY    INSTILL 2 SPRAYS INTO EACH NOSTRIL TWICE A DAY   IPRATROPIUM-ALBUTEROL (DUONEB) 0.5-2.5 (3) MG/3ML SOLN    inhale contents of 1 vial in nebulizer every 6 hours   MEMANTINE (NAMENDA) 10 MG TABLET    Take 1 tablet (10 mg total) by mouth 2 (two) times daily.   MULTIPLE VITAMINS-MINERALS (PRESERVISION AREDS 2) CAPS    Take 1 capsule by mouth 2 (two) times daily.    ONDANSETRON (ZOFRAN ODT) 4 MG DISINTEGRATING TABLET    Take 1 tablet (4 mg total) by mouth every 8 (eight) hours as needed for nausea or vomiting.   PANTOPRAZOLE (PROTONIX) 40 MG TABLET    Take 1 tablet (40 mg total) by mouth daily.   PROBIOTIC PRODUCT (PROBIOTIC DAILY PO)    Take 1 capsule by mouth daily.   RANITIDINE (ZANTAC) 150 MG TABLET    Take 150 mg by mouth daily.   VAYACOG 100-19.5-6.5 MG CAPS    take 1 capsule by mouth once daily  Modified Medications   No medications on file  Discontinued Medications   DOXYCYCLINE (VIBRA-TABS) 100 MG TABLET    Take 1 tablet (100 mg total) by mouth 2 (two) times daily.     Physical Exam:  Vitals:   02/09/18 1449  BP: 136/84  Pulse: 77  Temp: 98.4 F (36.9 C)  TempSrc: Oral  SpO2: 98%  Weight: 197 lb (89.4 kg)  Height: 5\' 3"  (1.6 m)   Body mass index is 34.9 kg/m.  Physical Exam  Constitutional: She appears well-developed and well-nourished.  HENT:  Head: Normocephalic and atraumatic.  Mouth/Throat: No oropharyngeal exudate.  Eyes: Conjunctivae and EOM are normal. Pupils are equal, round, and reactive to light.  Neck: Normal range of motion. Neck supple.  Cardiovascular: Normal rate, regular rhythm and normal heart sounds.  Pulmonary/Chest: Effort normal and breath sounds normal. She has no  wheezes.  Abdominal: Soft. Bowel sounds are normal.  Musculoskeletal: She exhibits edema (1+ to right).  varicosities to lower extremities  Neurological: She is alert.  Skin: Skin is warm and dry.  1 cm x 0.5 cm  wound with irregular borders to posterior right lower leg. No erythema or drainage noted however with 95% yellow base and 5% red around edges. Slight  tenderness surrounding area- however tender to bilateral LE  Psychiatric: Her affect is blunt. Cognition and memory are impaired. She exhibits abnormal remote memory.    Labs reviewed: Basic Metabolic Panel: Recent Labs    08/13/17 1335 11/27/17 1221 12/10/17 1349 01/08/18 1150  NA  --  141 143 144  K  --  4.3 4.7 3.9  CL  --  104 105 106  CO2  --  24 25 25   GLUCOSE  --  131* 80 112*  BUN  --  16 22 19   CREATININE  --  0.93 1.29* 1.17*  CALCIUM  --  8.7 9.2 9.1  TSH 0.888  --   --   --    Liver Function Tests: Recent Labs    02/16/17 1457 12/10/17 1349 01/08/18 1150  AST 23 26 19   ALT 15 17 17   ALKPHOS 59  --  72  BILITOT 0.8 1.3* 0.8  PROT 5.8* 6.1 5.9*  ALBUMIN 3.5*  --  3.6   No results for input(s): LIPASE, AMYLASE in the last 8760 hours. No results for input(s): AMMONIA in the last 8760 hours. CBC: Recent Labs    02/16/17 1457 11/27/17 1221 12/10/17 1349 01/08/18 1150  WBC 9.1 11.1* 11.7* 9.9  NEUTROABS 6,188  --  8,295* 6.3  HGB 13.4 13.0 14.7 13.7  HCT 42.1 40.1 45.5* 42.7  MCV 88.1 89 86.8 88  PLT 239 249 196  --    Lipid Panel: Recent Labs    12/10/17 1349  CHOL 204*  HDL 70  LDLCALC 111*  TRIG 123  CHOLHDL 2.9   TSH: Recent Labs    08/13/17 1335  TSH 0.888   A1C: Lab Results  Component Value Date   HGBA1C 5.2 12/10/2017     Assessment/Plan 1. Wound of right lower extremity, initial encounter -area of laceration now a wound. Pressure reduction encouraged -cleanse daily and then apply santyl to yellow area daily and cover -change dressing daily - collagenase (SANTYL)  ointment; Apply 1 application topically daily.  Dispense: 15 g; Refill: 0 -to notify for increase drainage, redness, heat or pain  Next appt: 02/16/2018 for wound check Virginia [name] K. [place], [facility] [phone]

## 2018-02-09 NOTE — Patient Instructions (Signed)
Cleanse wound daily and then Apply santyl to wound base daily and change dressing.   To follow up in 1 week  Notify for increase redness around wound, drainage, heat or tenderness

## 2018-02-11 ENCOUNTER — Telehealth: Payer: Self-pay | Admitting: Allergy and Immunology

## 2018-02-11 DIAGNOSIS — M19011 Primary osteoarthritis, right shoulder: Secondary | ICD-10-CM | POA: Diagnosis not present

## 2018-02-11 DIAGNOSIS — M1711 Unilateral primary osteoarthritis, right knee: Secondary | ICD-10-CM | POA: Diagnosis not present

## 2018-02-11 DIAGNOSIS — M19019 Primary osteoarthritis, unspecified shoulder: Secondary | ICD-10-CM | POA: Diagnosis not present

## 2018-02-11 MED ORDER — FLUTICASONE PROPIONATE 50 MCG/ACT NA SUSP: 2.0000 | Freq: Every day | NASAL | 0 refills | Status: DC

## 2018-02-11 NOTE — Telephone Encounter (Signed)
I have sent that prescription in and attached note that the patient will need an office visit for further refills.

## 2018-02-11 NOTE — Telephone Encounter (Signed)
Patient needs refill on fluticasone sent in the [facility] on [address]

## 2018-02-16 ENCOUNTER — Ambulatory Visit: Payer: Medicare Other | Admitting: Nurse Practitioner

## 2018-02-17 ENCOUNTER — Ambulatory Visit: Payer: Medicare Other | Admitting: Nurse Practitioner

## 2018-03-01 ENCOUNTER — Telehealth: Payer: Self-pay | Admitting: *Deleted

## 2018-03-01 DIAGNOSIS — R531 Weakness: Secondary | ICD-10-CM

## 2018-03-01 DIAGNOSIS — F0391 Unspecified dementia with behavioral disturbance: Secondary | ICD-10-CM

## 2018-03-01 NOTE — Telephone Encounter (Signed)
Caregiver, [name] is calling requesting a Rx for a Bedside Commode for patient. Would like a Rx. Please Advise.

## 2018-03-02 ENCOUNTER — Other Ambulatory Visit: Payer: Self-pay | Admitting: Internal Medicine

## 2018-03-02 NOTE — Telephone Encounter (Signed)
Okay to get Rx for this

## 2018-03-03 ENCOUNTER — Encounter (HOSPITAL_BASED_OUTPATIENT_CLINIC_OR_DEPARTMENT_OTHER): Payer: Self-pay

## 2018-03-03 ENCOUNTER — Encounter (HOSPITAL_BASED_OUTPATIENT_CLINIC_OR_DEPARTMENT_OTHER): Payer: Medicare Other

## 2018-03-03 NOTE — Telephone Encounter (Signed)
I spoke with patient's daughter who stated that she would stop by the office to pick up the order for bedside commode.   Order was placed in front filing cabinet for pick up.

## 2018-03-03 NOTE — Telephone Encounter (Signed)
Order placed and Printed.   Tried calling caregiver, Mailbox is full and cannot leave message. Will try again later. (not sure if she wants [place] to fax this somewhere or pick up)

## 2018-03-03 NOTE — Telephone Encounter (Signed)
Daughter called and stated that she did not have a copy of patient's insurance card when she dropped off the order at [facility] and they told her to have [place] fax the order with Insurance Card.  Faxed to [facility] Fax: [phone].

## 2018-03-07 IMAGING — CR DG KNEE COMPLETE 4+V*R*
4 series · 4 of 4 positions shown · non-contrast
Comparison: None.

CLINICAL DATA: Right knee pain after falling 1-2 days ago.

EXAM:
RIGHT KNEE - COMPLETE 4+ VIEW

[t knee ap right]
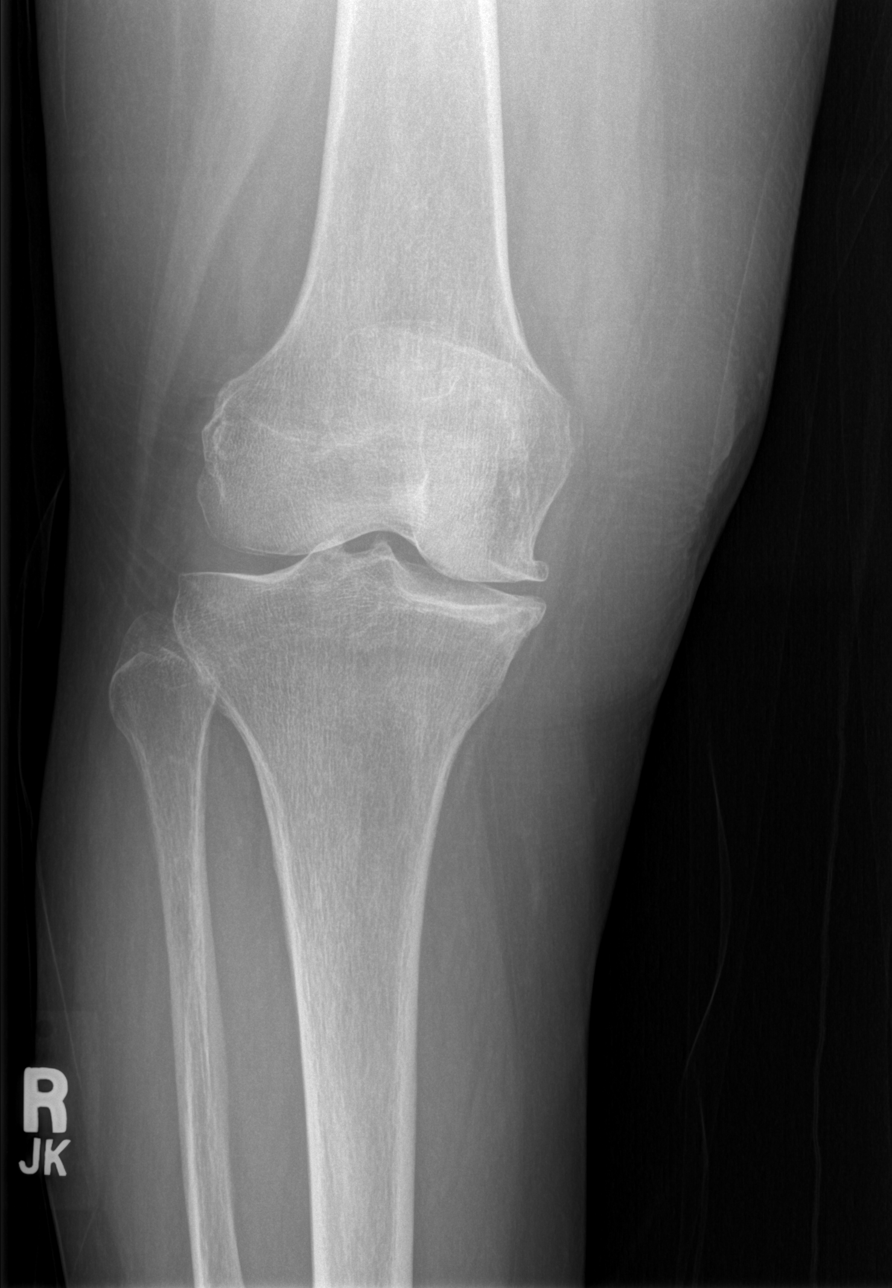

[t knee oblique right (1 of 2)]
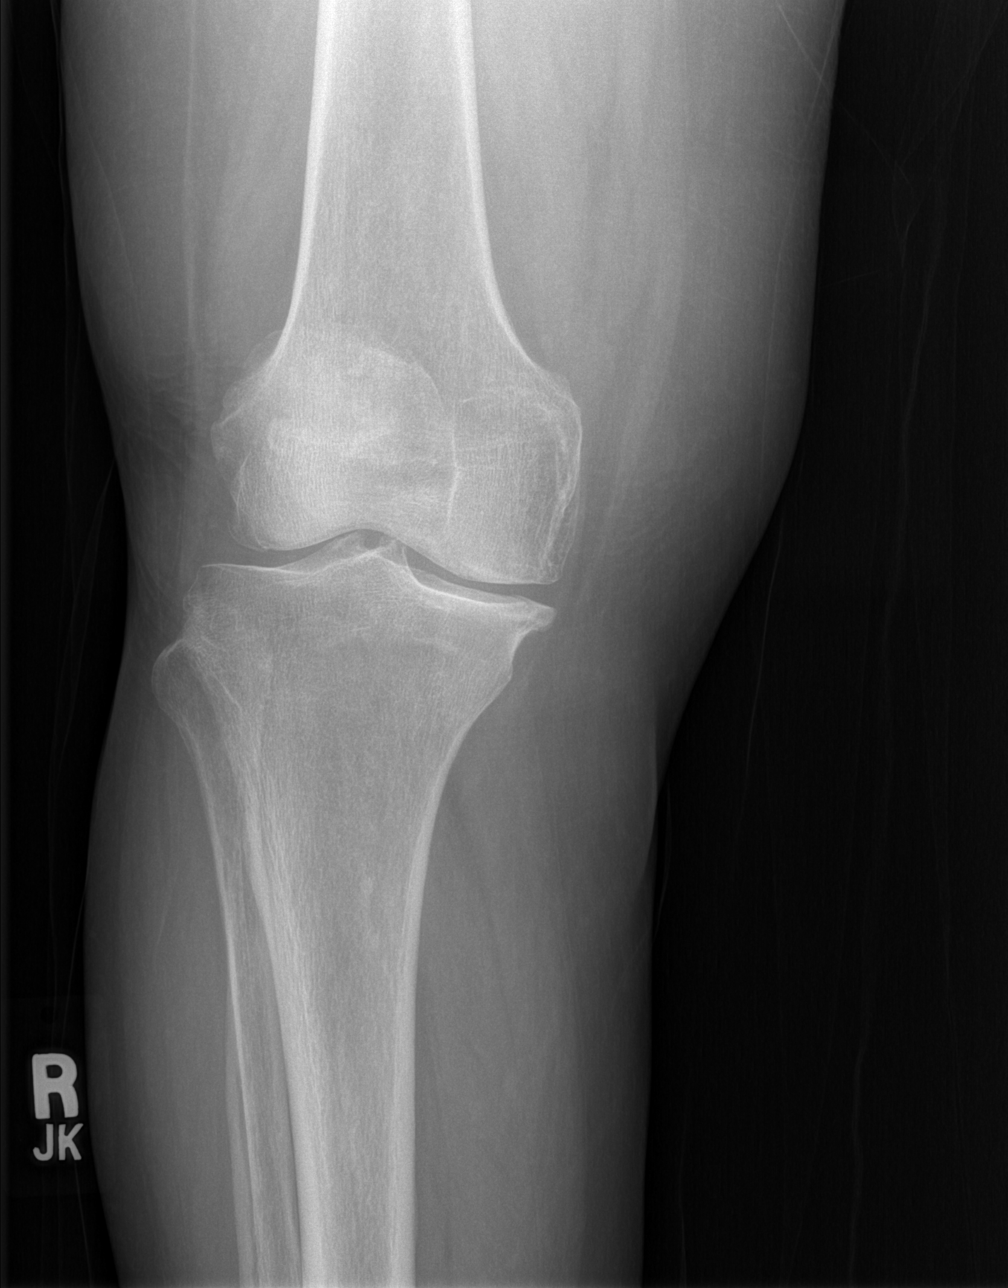

[t knee oblique right (2 of 2)]
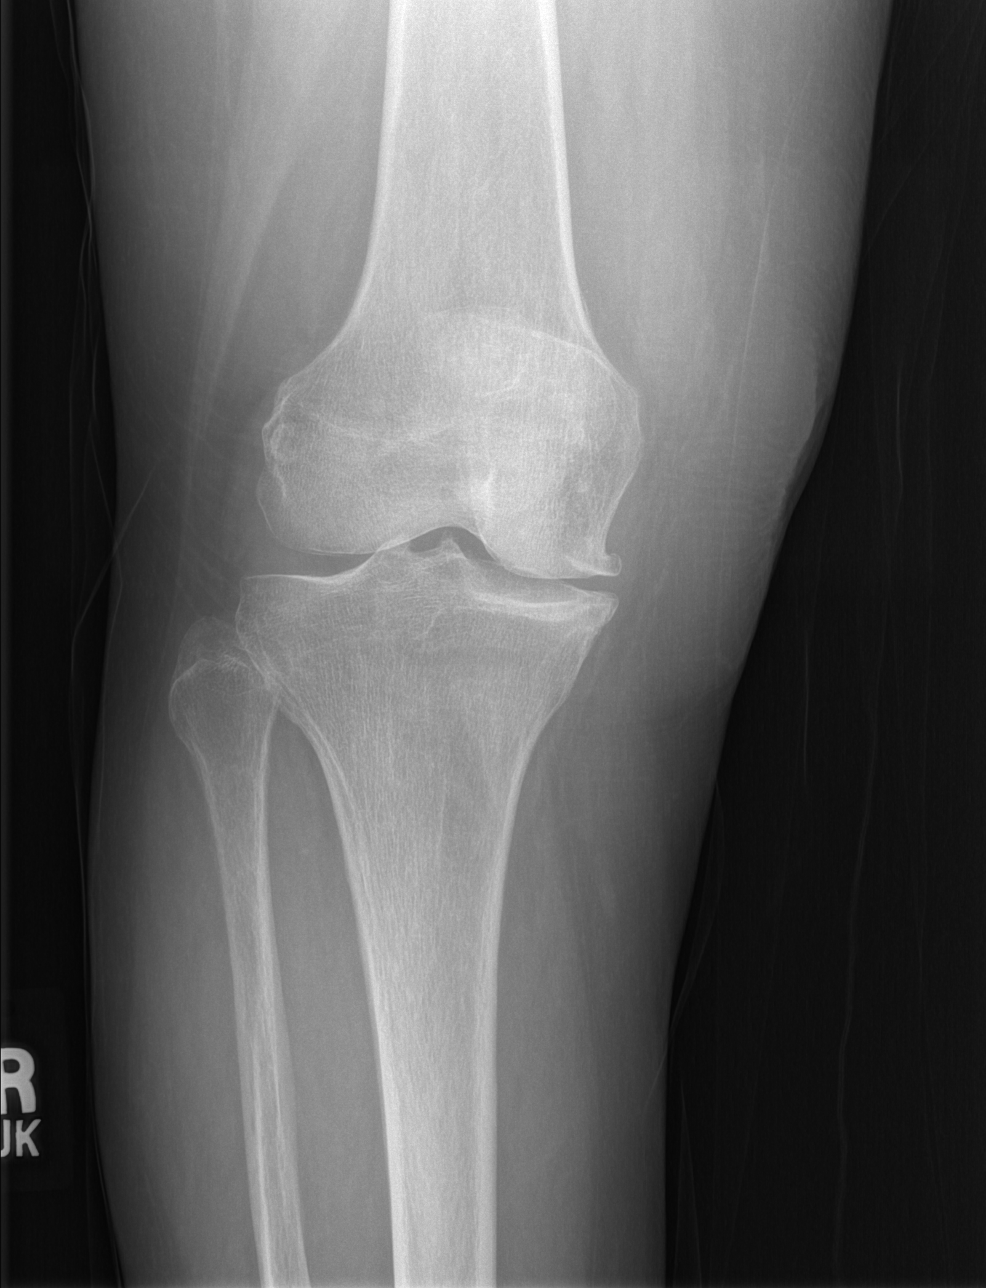

[t knee lat right]
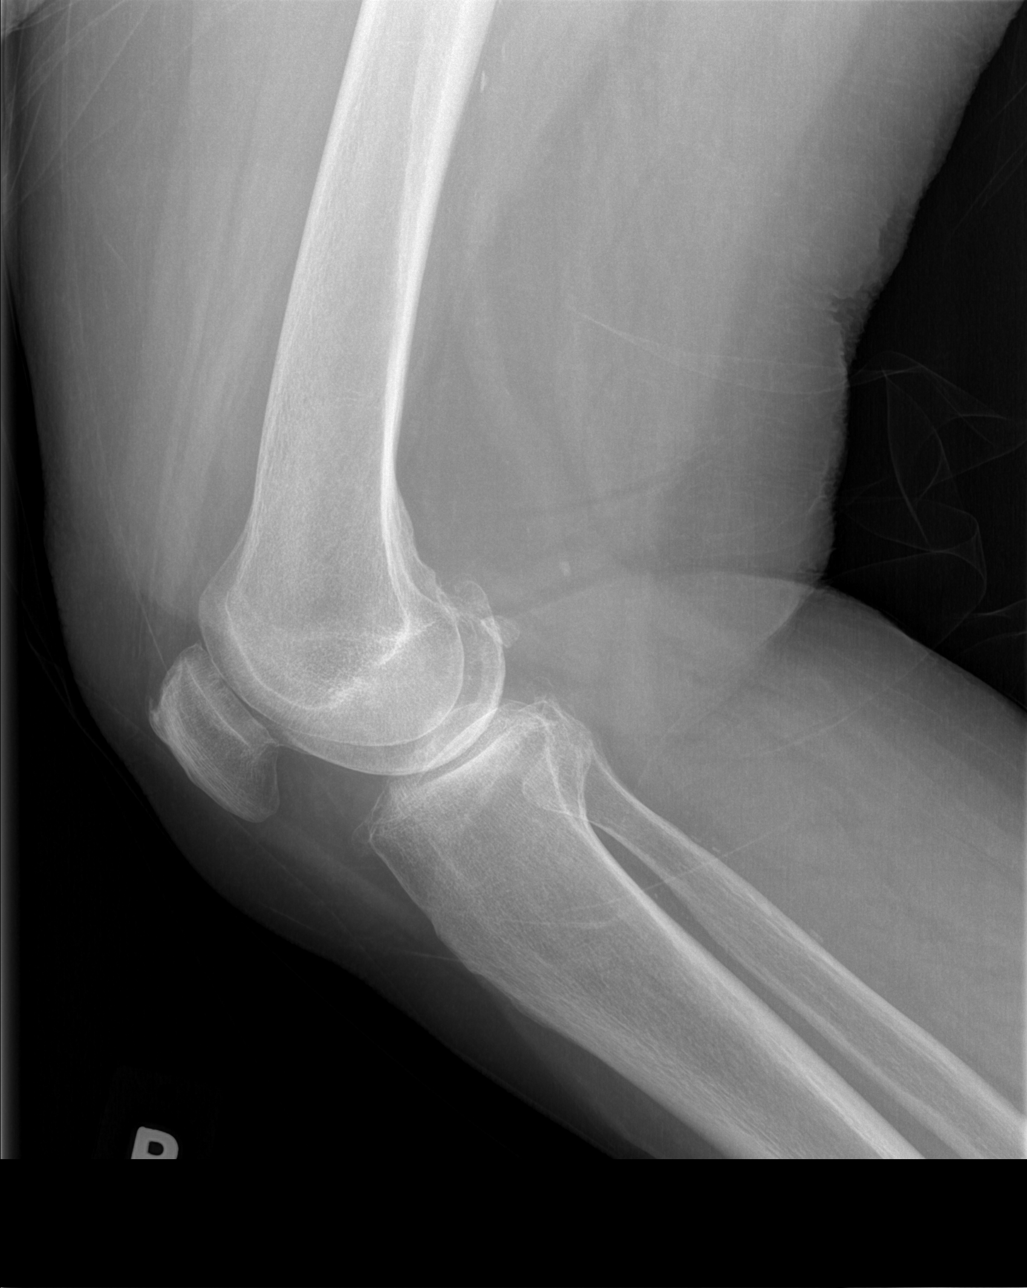

[4 of 4 positions shown; findings below may reference images not displayed]

FINDINGS: Mild degenerative narrowing of the medial compartment, with
associated osseous spurring. Lateral and patellofemoral compartments
appear relatively well preserved.

Overall osseous alignment is normal. There is no fracture line or
displaced fracture. No acute or suspicious osseous lesion. No
appreciable joint effusion and adjacent soft tissues are
unremarkable.
IMPRESSION: 1. No acute findings.
2. Mild degenerative change.

## 2018-03-07 IMAGING — CR DG SACRUM/COCCYX 2+V
3 series · 3 of 3 positions shown · non-contrast
Comparison: None.

CLINICAL DATA: Right-sided low back pain and lateral hip pain after
fall 2 days ago.

EXAM:
SACRUM AND COCCYX - 2+ VIEW

[t sacrum a.p.]
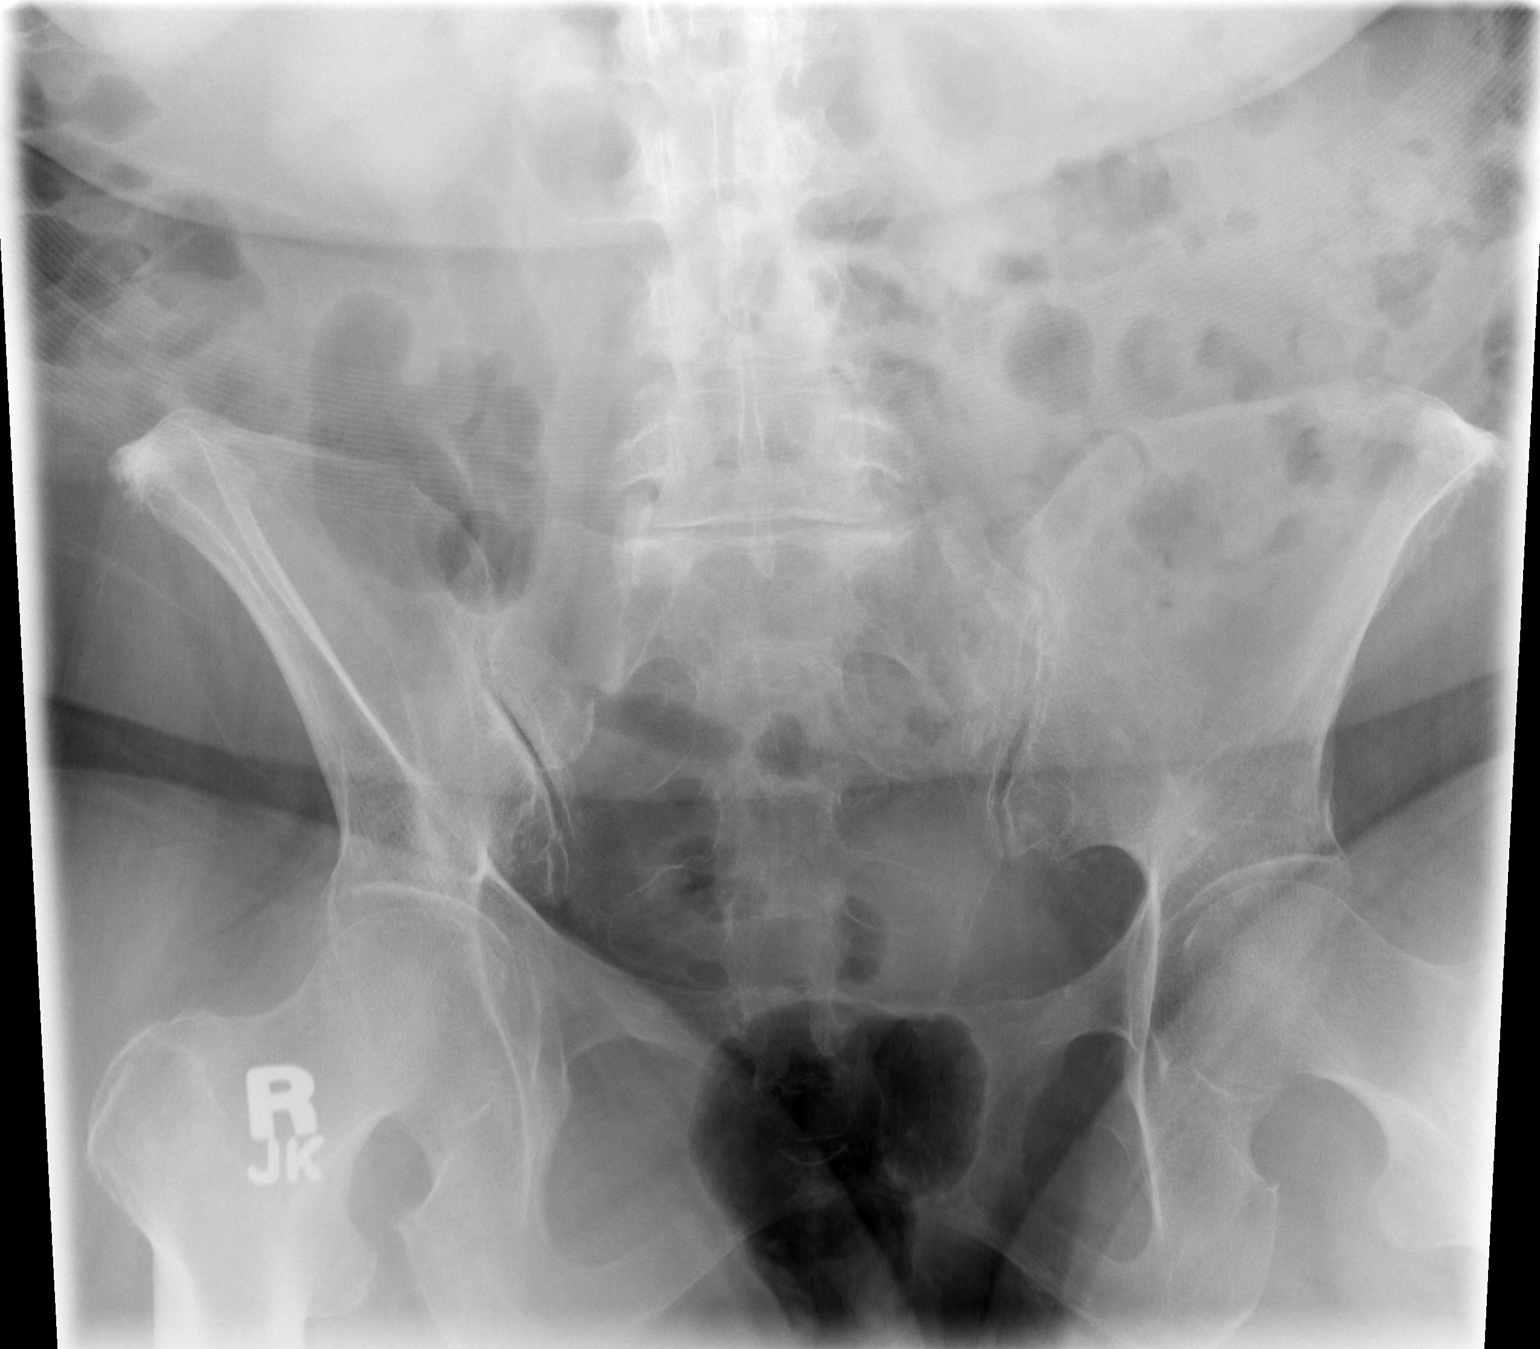

[t coccyx a.p.]
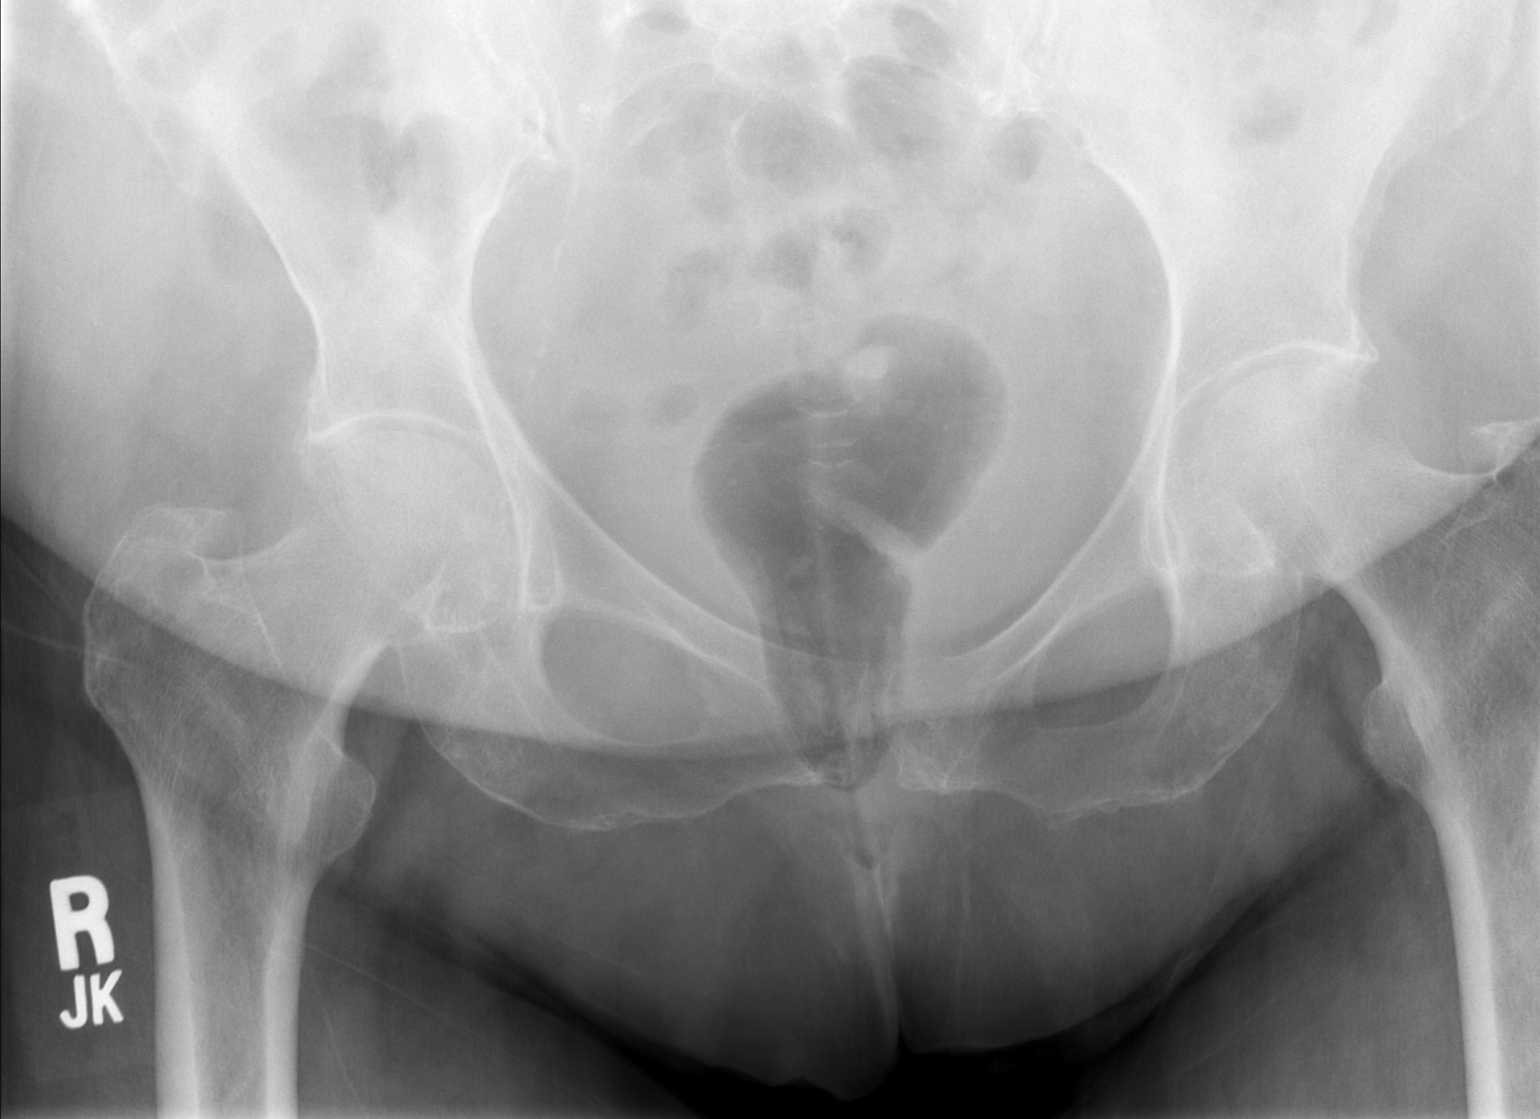

[t coccyx lat]
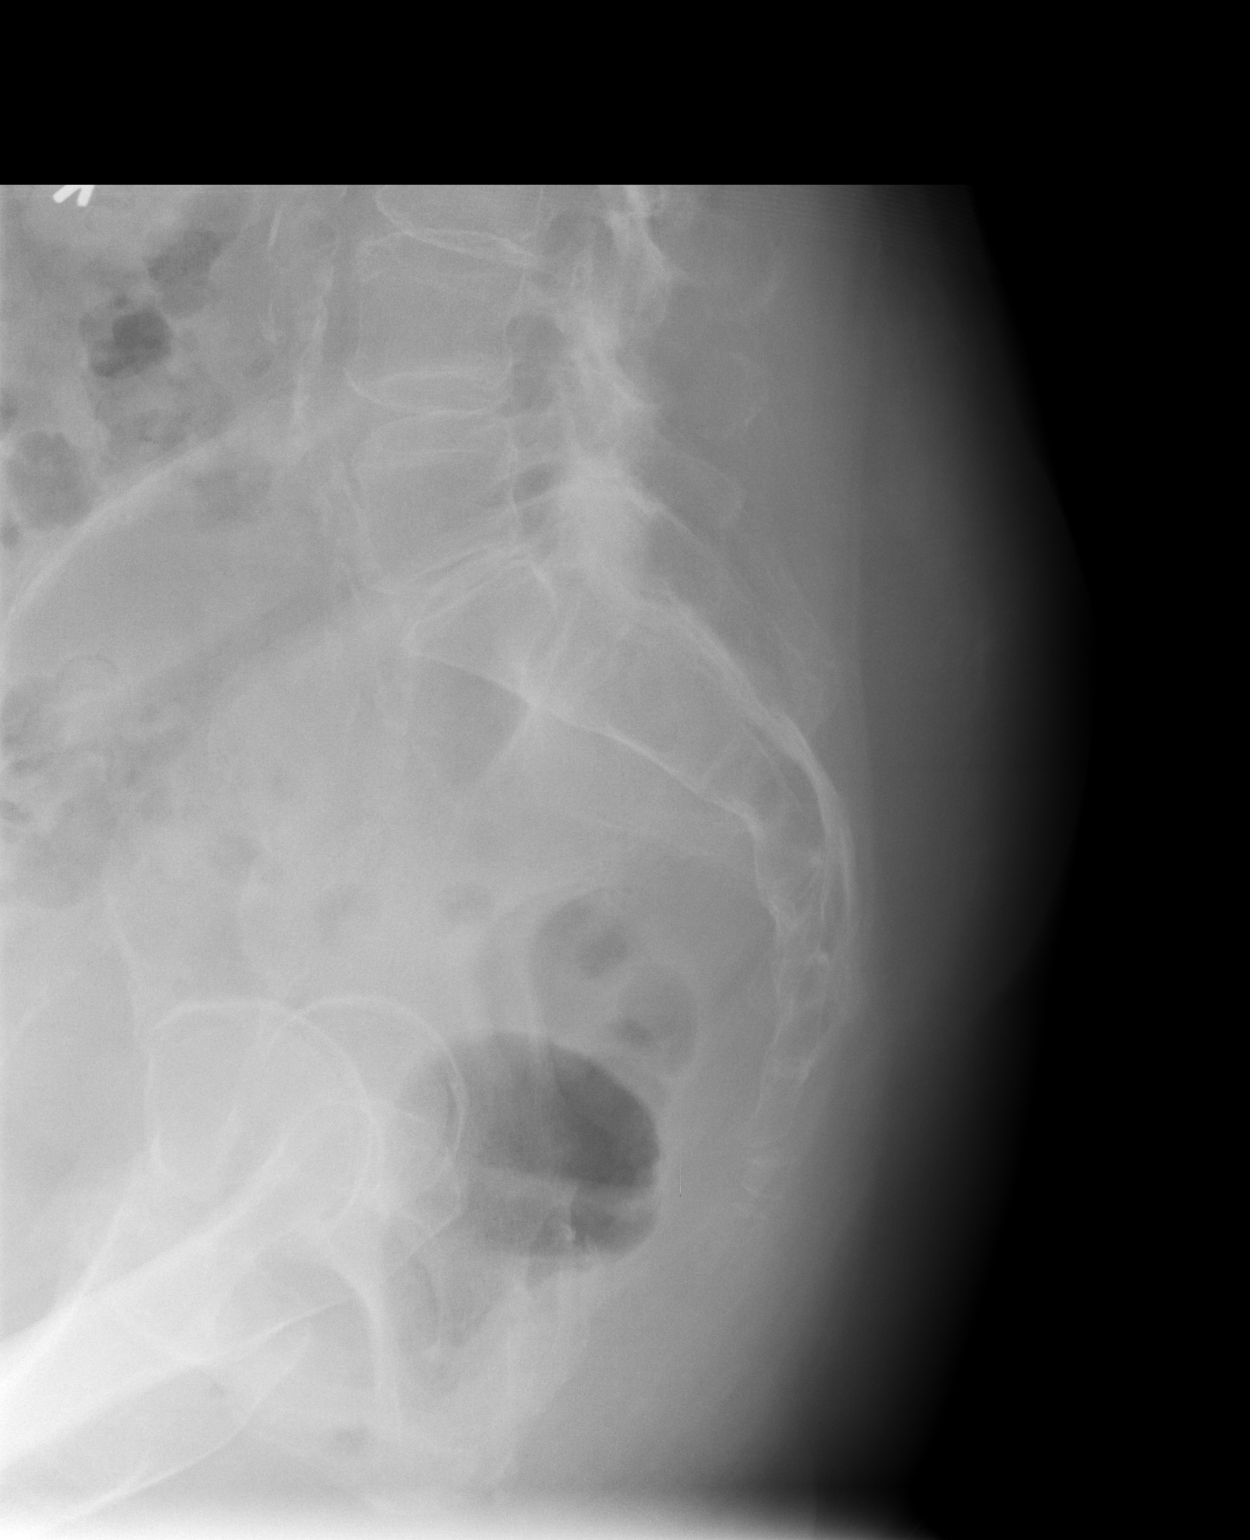

[3 of 3 positions shown; findings below may reference images not displayed]

FINDINGS: Examination demonstrates no evidence of acute fracture or
dislocation. There are degenerative changes of the spine and mild
degenerate change of the hips. Atherosclerotic plaque over the
abdominal aorta iliac arteries..
IMPRESSION: No acute findings.

## 2018-03-08 ENCOUNTER — Encounter: Payer: Self-pay | Admitting: Nurse Practitioner

## 2018-03-08 ENCOUNTER — Ambulatory Visit (INDEPENDENT_AMBULATORY_CARE_PROVIDER_SITE_OTHER): Payer: Medicare Other | Admitting: Nurse Practitioner

## 2018-03-08 ENCOUNTER — Ambulatory Visit: Payer: Medicare Other | Admitting: Nurse Practitioner

## 2018-03-08 VITALS — BP 156/82 | HR 74 | Temp 98.2°F | Resp 10 | Ht 63.0 in | Wt 194.0 lb

## 2018-03-08 DIAGNOSIS — I6529 Occlusion and stenosis of unspecified carotid artery: Secondary | ICD-10-CM

## 2018-03-08 DIAGNOSIS — I10 Essential (primary) hypertension: Secondary | ICD-10-CM

## 2018-03-08 DIAGNOSIS — S81801A Unspecified open wound, right lower leg, initial encounter: Secondary | ICD-10-CM | POA: Diagnosis not present

## 2018-03-08 DIAGNOSIS — M533 Sacrococcygeal disorders, not elsewhere classified: Secondary | ICD-10-CM

## 2018-03-08 DIAGNOSIS — F0391 Unspecified dementia with behavioral disturbance: Secondary | ICD-10-CM | POA: Diagnosis not present

## 2018-03-08 DIAGNOSIS — R531 Weakness: Secondary | ICD-10-CM

## 2018-03-08 NOTE — Progress Notes (Signed)
Careteam: Patient Care Team: Lauree Chandler, NP as PCP - General (Nurse Practitioner) Fay Records, MD as Consulting Physician (Cardiology) Tanda Rockers, MD as Consulting Physician (Pulmonary Disease)  Advanced Directive information    Allergies  Allergen Reactions  . Lidocaine Anaphylaxis, Swelling, Rash and Other (See Comments)    Any of the " [facility] "  . Other Other (See Comments)    All drugs that end in "cane'-  novacane etc. Daughter states patient almost died when she had it when she was born    Chief Complaint  Patient presents with  . Follow-up    Elevated B/P at home 185/113 (had not taken meds), 178/106 (after meds), Right lower leg wound check, + fall risk. Patient spend most of her time in bed, sleeps good at night. Here with daughter-[name]   . Referral    Home health referral requst-? [facility]   . FYI    Patient not seen at wound clinic on 03/03/17, left without being seenn  . Medication Refill    No refills needed      HPI: Patient is a 82 y.o. female seen in the office today for wound care check. Here with daughter.  Did not follow up for 1 week follow up- daughter here today and unaware of this.  Reports sleeping a lot. Staying in bed. Wont let people help.  Multiple falls- has sacral pain after falling. ? If tylenol is okay to take. Daughter has a person to come in and do exercises weekly She was going to exercise classes but now wont do this. Daughter is concerned over pts caregiver- pts brother is her caregiver- caregiver stress food intake down- does not eat as much - likes sweets Pt feels like she fine, HPI/ROS limited due to her progressive dementia.   Review of Systems:  Review of Systems  Unable to perform ROS: Dementia    Past Medical History:  Diagnosis Date  . Arthritis   . Carotid artery disease ([place])    a. s/p L CEA. b. followed by [facility].  . Chronic diastolic CHF (congestive heart failure) ([place])   . CKD (chronic  kidney disease), stage III ([place])   . COPD (chronic obstructive pulmonary disease) ([place])   . Diverticulosis   . Fall at home Sept. 2013  . Hyperlipidemia   . Hypertension   . Internal hemorrhoid   . Memory disorder 10/24/2014   Past Surgical History:  Procedure Laterality Date  . APPENDECTOMY    . BREAST REDUCTION SURGERY    . CAROTID ENDARTERECTOMY     left CEA  . CATARACT EXTRACTION Bilateral   . COLONOSCOPY  2015  . EYE SURGERY     cataracts removed, bilaterally   . skin cancer removal    . TONSILLECTOMY     Social History:   reports that she quit smoking about 23 years ago. Her smoking use included cigarettes. She has a 5.00 pack-year smoking history. She has never used smokeless tobacco. She reports that she does not drink alcohol or use drugs.  Family History  Problem Relation Age of Onset  . Heart disease Father        Before age 80  . Hypertension Father   . Heart attack Father   . Cancer Mother 57       Brain  . Dementia Brother   . Cancer Maternal Aunt 61       breast cancer  . Diabetes Maternal Aunt   .  Heart failure Maternal Grandmother   . Diabetes Maternal Grandmother   . Stroke Neg Hx     Medications: Patient's Medications  New Prescriptions   No medications on file  Previous Medications   ALBUTEROL (PROVENTIL) (2.5 MG/3ML) 0.083% NEBULIZER SOLUTION    Take 6 mLs (5 mg total) by nebulization every 4 (four) hours as needed for wheezing or shortness of breath.   ARTIFICIAL TEAR OP    Place 2 drops into both eyes daily.    ASPIRIN EC 81 MG TABLET    Take 81 mg by mouth every evening.   ATORVASTATIN (LIPITOR) 20 MG TABLET    take 1 tablet by mouth once daily   AZELASTINE (ASTELIN) 0.1 % NASAL SPRAY    Place 2 sprays into both nostrils 2 (two) times daily. Use in each nostril as directed   BREO ELLIPTA 100-25 MCG/INH AEPB    inhale 1 puff INTO THE LUNGS daily   BYSTOLIC 5 MG TABLET    take 1 tablet by mouth once daily   CALCIUM CARBONATE-VITAMIN D  (CALCIUM-D PO)    Take 1 tablet by mouth every evening.   CARBINOXAMINE MALEATE ER [facility] ER) 4 MG/5ML SUER    Take 7.5 mLs by mouth every 12 (twelve) hours as needed.   COLLAGENASE (SANTYL) OINTMENT    Apply 1 application topically daily.   DICLOFENAC SODIUM (VOLTAREN) 1 % GEL    Apply 1 application topically 2 (two) times daily as needed (shoulder and knee pain).   DONEPEZIL (ARICEPT) 10 MG TABLET    Take 1 tablet (10 mg total) by mouth daily.   FLUTICASONE (FLONASE) 50 MCG/ACT NASAL SPRAY    Place 2 sprays into both nostrils daily.   FUROSEMIDE (LASIX) 20 MG TABLET    Take 1 tablet daily, take 1 tablet daily as needed, for weight gain of 3-5 lbs   IPRATROPIUM (ATROVENT) 0.06 % NASAL SPRAY    INSTILL 2 SPRAYS INTO EACH NOSTRIL TWICE A DAY   IPRATROPIUM-ALBUTEROL (DUONEB) 0.5-2.5 (3) MG/3ML SOLN    inhale contents of 1 vial in nebulizer every 6 hours   MEMANTINE (NAMENDA) 10 MG TABLET    Take 1 tablet (10 mg total) by mouth 2 (two) times daily.   MULTIPLE VITAMINS-MINERALS (PRESERVISION AREDS 2) CAPS    Take 1 capsule by mouth 2 (two) times daily.    ONDANSETRON (ZOFRAN ODT) 4 MG DISINTEGRATING TABLET    Take 1 tablet (4 mg total) by mouth every 8 (eight) hours as needed for nausea or vomiting.   PANTOPRAZOLE (PROTONIX) 40 MG TABLET    Take 1 tablet (40 mg total) by mouth daily.   PROBIOTIC PRODUCT (PROBIOTIC DAILY PO)    Take 1 capsule by mouth daily.   RANITIDINE (ZANTAC) 150 MG TABLET    TAKE 1 TABLET BY MOUTH AT BEDTIME   VAYACOG 100-19.5-6.5 MG CAPS    take 1 capsule by mouth once daily  Modified Medications   No medications on file  Discontinued Medications   No medications on file     Physical Exam:  Vitals:   03/08/18 1320  BP: (!) 156/82  Pulse: 74  Resp: 10  Temp: 98.2 F (36.8 C)  TempSrc: Oral  SpO2: 93%  Weight: 194 lb (88 kg)  Height: 5\' 3"  (1.6 m)   Body mass index is 34.37 kg/m.  Physical Exam  Constitutional: She appears well-developed and well-nourished.   HENT:  Head: Normocephalic and atraumatic.  Mouth/Throat: No oropharyngeal exudate.  Eyes: Pupils  are equal, round, and reactive to light. Conjunctivae and EOM are normal.  Neck: Normal range of motion. Neck supple.  Cardiovascular: Normal rate, regular rhythm and normal heart sounds.  Pulmonary/Chest: Effort normal and breath sounds normal. She has no wheezes.  Abdominal: Soft. Bowel sounds are normal.  Musculoskeletal: She exhibits edema (1+ to right).  varicosities to lower extremities  Neurological: She is alert.  Skin: Skin is warm and dry.  2 cm x 0.5 cm wound with irregular borders to posterior right lower leg. No erythema or drainage noted however with 95% yellow base and 5% red around edges. Slight  tenderness surrounding area  Psychiatric: Her affect is blunt. Cognition and memory are impaired. She exhibits abnormal remote memory.    Labs reviewed: Basic Metabolic Panel: Recent Labs    08/13/17 1335 11/27/17 1221 12/10/17 1349 01/08/18 1150  NA  --  141 143 144  K  --  4.3 4.7 3.9  CL  --  104 105 106  CO2  --  24 25 25   GLUCOSE  --  131* 80 112*  BUN  --  16 22 19   CREATININE  --  0.93 1.29* 1.17*  CALCIUM  --  8.7 9.2 9.1  TSH 0.888  --   --   --    Liver Function Tests: Recent Labs    12/10/17 1349 01/08/18 1150  AST 26 19  ALT 17 17  ALKPHOS  --  72  BILITOT 1.3* 0.8  PROT 6.1 5.9*  ALBUMIN  --  3.6   No results for input(s): LIPASE, AMYLASE in the last 8760 hours. No results for input(s): AMMONIA in the last 8760 hours. CBC: Recent Labs    11/27/17 1221 12/10/17 1349 01/08/18 1150  WBC 11.1* 11.7* 9.9  NEUTROABS  --  8,295* 6.3  HGB 13.0 14.7 13.7  HCT 40.1 45.5* 42.7  MCV 89 86.8 88  PLT 249 196  --    Lipid Panel: Recent Labs    12/10/17 1349  CHOL 204*  HDL 70  LDLCALC 111*  TRIG 123  CHOLHDL 2.9   TSH: Recent Labs    08/13/17 1335  TSH 0.888   A1C: Lab Results  Component Value Date   HGBA1C 5.2 12/10/2017      Assessment/Plan 1. Dementia with behavioral disturbance, unspecified dementia type -progressive decline,pts daughter in town and concerned over caregiver burden of the pts brother who has been taking care of pt. Need additional help in the home.  - Ambulatory referral to [facility] - Amb Referral to Palliative Care  2. Wound of right lower extremity, initial encounter -unchanged, wound has delayed healing and measurements actually are larger, no signs of infection noted will continue santyl and referral to wound care center.   3. Weakness -progressive decline due to worsening of dementia.  - Ambulatory referral to [facility] - Amb Referral to Palliative Care  4. Essential hypertension Improved on recheck. Blood pressure variable at home as well. Will continue current regimen and encouraged proper hydration   5. Sacral pain -after fall, using pressure reduction to seat to help pain also can use tylenol (acetaminophen) 650 mg by mouth every 6 hours as needed pain.    Next appt: as scheduled, sooner if needed  Rayansh Herbst K. [place], [facility] [phone]

## 2018-03-08 NOTE — Patient Instructions (Addendum)
Okay to use tylenol (acetaminophen) 650 mg by mouth every 6 hours as needed pain.   Wound care referral placed Continued santyl daily   Increase protein intake

## 2018-03-10 ENCOUNTER — Ambulatory Visit: Payer: Medicare Other | Admitting: Nurse Practitioner

## 2018-03-10 ENCOUNTER — Encounter: Payer: Medicare Other | Attending: Internal Medicine | Admitting: Internal Medicine

## 2018-03-10 DIAGNOSIS — J449 Chronic obstructive pulmonary disease, unspecified: Secondary | ICD-10-CM | POA: Diagnosis not present

## 2018-03-10 DIAGNOSIS — I2510 Atherosclerotic heart disease of native coronary artery without angina pectoris: Secondary | ICD-10-CM | POA: Insufficient documentation

## 2018-03-10 DIAGNOSIS — I509 Heart failure, unspecified: Secondary | ICD-10-CM | POA: Diagnosis not present

## 2018-03-10 DIAGNOSIS — I110 Hypertensive heart disease with heart failure: Secondary | ICD-10-CM | POA: Insufficient documentation

## 2018-03-10 DIAGNOSIS — L97213 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scle: Secondary | ICD-10-CM | POA: Diagnosis not present

## 2018-03-10 DIAGNOSIS — L97215 Non-pressure chronic ulcer of right calf with muscle involvement without evidence of necrosis: Secondary | ICD-10-CM | POA: Diagnosis present

## 2018-03-10 DIAGNOSIS — F0390 Unspecified dementia without behavioral disturbance: Secondary | ICD-10-CM | POA: Diagnosis not present

## 2018-03-10 DIAGNOSIS — Z87891 Personal history of nicotine dependence: Secondary | ICD-10-CM | POA: Diagnosis not present

## 2018-03-10 DIAGNOSIS — L97212 Non-pressure chronic ulcer of right calf with fat layer exposed: Secondary | ICD-10-CM | POA: Diagnosis not present

## 2018-03-10 DIAGNOSIS — I872 Venous insufficiency (chronic) (peripheral): Secondary | ICD-10-CM | POA: Diagnosis not present

## 2018-03-11 ENCOUNTER — Other Ambulatory Visit: Payer: Self-pay | Admitting: Nurse Practitioner

## 2018-03-12 NOTE — Progress Notes (Signed)
SAMARRA, RIDGELY ([id number]) Visit Report for 03/10/2018 Abuse/Suicide Risk Screen Details Patient Name: Virginia [name], Virginia [name]. Date of Service: 03/10/2018 3:00 PM Medical Record Number: [id number] Patient Account Number: [id number] Date of Birth/Sex: 1936/06/25 (82 y.o. Female) Treating RN: Montey Hora Primary Care Mattison Golay: Sherrie Mustache Other Clinician: Referring Arihant Pennings: Sherrie Mustache Treating Stina Gane/Extender: Tito Dine in Treatment: 0 Abuse/Suicide Risk Screen Items Answer ABUSE/SUICIDE RISK SCREEN: Has anyone close to you tried to hurt or harm you recentlyo No Do you feel uncomfortable with anyone in your familyo No Has anyone forced you do things that you didnot want to doo No Do you have any thoughts of harming yourselfo No Patient displays signs or symptoms of abuse and/or neglect. No Electronic Signature(s) Signed: 03/10/2018 5:03:40 PM By: Montey Hora Entered By: Montey Hora on 03/10/2018 15:31:29 [name] ([id number]) -------------------------------------------------------------------------------- Activities of Daily Living Details Patient Name: Virginia [name]. Date of Service: 03/10/2018 3:00 PM Medical Record Number: [id number] Patient Account Number: [id number] Date of Birth/Sex: [birth year] (82 y.o. Female) Treating RN: Montey Hora Primary Care Tramon Crescenzo: Sherrie Mustache Other Clinician: Referring Brylyn Novakovich: Sherrie Mustache Treating Kashus Karlen/Extender: Tito Dine in Treatment: 0 Activities of Daily Living Items Answer Activities of Daily Living (Please select one for each item) Drive Automobile Not Able Take Medications Need Assistance Use Telephone Need Assistance Care for Appearance Need Assistance Use Toilet Need Assistance Bath / Shower Need Assistance Dress Self Need Assistance Feed Self Completely Able Walk Need Assistance Get In / Out Bed Need Assistance Housework Need Assistance Prepare Meals Need  Assistance Handle Money Need Assistance Shop for Self Need Assistance Electronic Signature(s) Signed: 03/10/2018 5:03:40 PM By: Montey Hora Entered By: Montey Hora on 03/10/2018 15:32:06 [name] ([id number]) -------------------------------------------------------------------------------- Education Assessment Details Patient Name: Virginia [name]. Date of Service: 03/10/2018 3:00 PM Medical Record Number: [id number] Patient Account Number: [id number] Date of Birth/Sex: 1936/03/06 (82 y.o. Female) Treating RN: Montey Hora Primary Care Tyjon Bowen: Sherrie Mustache Other Clinician: Referring Alyne Martinson: Sherrie Mustache Treating Koa Palla/Extender: Tito Dine in Treatment: 0 Primary Learner Assessed: Caregiver family Reason Patient is not Primary Learner: wound location Learning Preferences/Education Level/Primary Language Learning Preference: Explanation, Demonstration Highest Education Level: College or Above Preferred Language: English Cognitive Barrier Assessment/Beliefs Language Barrier: No Translator Needed: No Memory Deficit: No Emotional Barrier: No Cultural/Religious Beliefs Affecting Medical Care: No Physical Barrier Assessment Impaired Vision: No Impaired Hearing: No Decreased Hand dexterity: No Knowledge/Comprehension Assessment Knowledge Level: Medium Comprehension Level: Medium Ability to understand written Medium instructions: Ability to understand verbal Medium instructions: Motivation Assessment Anxiety Level: Calm Cooperation: Cooperative Education Importance: Acknowledges Need Interest in Health Problems: Asks Questions Perception: Coherent Willingness to Engage in Self- Medium Management Activities: Readiness to Engage in Self- Medium Management Activities: Electronic Signature(s) Signed: 03/10/2018 5:03:40 PM By: Montey Hora Entered By: Montey Hora on 03/10/2018 15:32:46 [name]  ([id number]) -------------------------------------------------------------------------------- Fall Risk Assessment Details Patient Name: Virginia [name]. Date of Service: 03/10/2018 3:00 PM Medical Record Number: [id number] Patient Account Number: [id number] Date of Birth/Sex: Nov 06, 1936 (82 y.o. Female) Treating RN: Montey Hora Primary Care Daiveon Markman: Sherrie Mustache Other Clinician: Referring Vernestine Brodhead: Sherrie Mustache Treating Jefferey Lippmann/Extender: Tito Dine in Treatment: 0 Fall Risk Assessment Items Have you had 2 or more falls in the last 12 monthso 0 Yes Have you had any fall that resulted in injury in the last 12 monthso 0 No FALL RISK ASSESSMENT: History of falling - immediate or within 3 months 25 Yes Secondary diagnosis 0 No Ambulatory  aid None/bed rest/wheelchair/nurse 0 No Crutches/cane/walker 15 Yes Furniture 0 No IV Access/Saline Lock 0 No Gait/Training Normal/bed rest/immobile 0 No Weak 10 Yes Impaired 0 No Mental Status Oriented to own ability 0 Yes Electronic Signature(s) Signed: 03/10/2018 5:03:40 PM By: Montey Hora Entered By: Montey Hora on 03/10/2018 15:33:13 [name] ([id number]) -------------------------------------------------------------------------------- Foot Assessment Details Patient Name: Virginia [name]. Date of Service: 03/10/2018 3:00 PM Medical Record Number: [id number] Patient Account Number: [id number] Date of Birth/Sex: 1936/07/18 (82 y.o. Female) Treating RN: Montey Hora Primary Care Daney Moor: Sherrie Mustache Other Clinician: Referring Amos Micheals: Sherrie Mustache Treating Mavrick Mcquigg/Extender: Tito Dine in Treatment: 0 Foot Assessment Items Site Locations + = Sensation present, - = Sensation absent, C = Callus, U = Ulcer R = Redness, W = Warmth, M = Maceration, PU = Pre-ulcerative lesion F = Fissure, S = Swelling, D = Dryness Assessment Right: Left: Other Deformity: No No Prior Foot Ulcer: No  No Prior Amputation: No No Charcot Joint: No No Ambulatory Status: Ambulatory With Help Assistance Device: Wheelchair Gait: Buyer, retail Signature(s) Signed: 03/10/2018 5:03:40 PM By: Montey Hora Entered By: Montey Hora on 03/10/2018 15:55:17 [name] ([id number]) -------------------------------------------------------------------------------- Nutrition Risk Assessment Details Patient Name: Virginia [name]. Date of Service: 03/10/2018 3:00 PM Medical Record Number: [id number] Patient Account Number: [id number] Date of Birth/Sex: 18-Jul-1936 (82 y.o. Female) Treating RN: Montey Hora Primary Care Louden Houseworth: Sherrie Mustache Other Clinician: Referring Kiev Labrosse: Sherrie Mustache Treating Herb Beltre/Extender: Ricard Dillon Weeks in Treatment: 0 Height (in): Weight (lbs): Body Mass Index (BMI): Nutrition Risk Assessment Items NUTRITION RISK SCREEN: I have an illness or condition that made me change the kind and/or amount of 0 No food I eat I eat fewer than two meals per day 0 No I eat few fruits and vegetables, or milk products 0 No I have three or more drinks of beer, liquor or wine almost every day 0 No I have tooth or mouth problems that make it hard for me to eat 0 No I don't always have enough money to buy the food I need 0 No I eat alone most of the time 0 No I take three or more different prescribed or over-the-counter drugs [name] day 1 Yes Without wanting to, I have lost or gained 10 pounds in the last six months 0 No I am not always physically able to shop, cook and/or feed myself 0 No Nutrition Protocols Good Risk Protocol 0 No interventions needed Moderate Risk Protocol Electronic Signature(s) Signed: 03/10/2018 5:03:40 PM By: Montey Hora Entered By: Montey Hora on 03/10/2018 15:33:19

## 2018-03-12 NOTE — Progress Notes (Signed)
KENZLEY, KE ([id number]) Visit Report for 03/10/2018 Chief Complaint Document Details Patient Name: JALEEYA, MCNELLY A. Date of Service: 03/10/2018 3:00 PM Medical Record Number: [id number] Patient Account Number: [id number] Date of Birth/Sex: [birth year] (82 y.o. Female) Treating RN: Cornell Barman Primary Care Provider: Sherrie Mustache Other Clinician: Referring Provider: Sherrie Mustache Treating Provider/Extender: Tito Dine in Treatment: 0 Information Obtained from: Patient Chief Complaint 03/10/18; patient is here for review of a wound on her right posterior calf Electronic Signature(s) Signed: 03/10/2018 5:02:04 PM By: Linton Ham MD Entered By: Linton Ham on 03/10/2018 16:26:25 [name] ([id number]) -------------------------------------------------------------------------------- Debridement Details Patient Name: [name] Date of Service: 03/10/2018 3:00 PM Medical Record Number: [id number] Patient Account Number: [id number] Date of Birth/Sex: 09/01/1936 (82 y.o. Female) Treating RN: Cornell Barman Primary Care Provider: Sherrie Mustache Other Clinician: Referring Provider: Sherrie Mustache Treating Provider/Extender: Tito Dine in Treatment: 0 Debridement Performed for Wound #1 Right,Posterior Lower Leg Assessment: Performed By: Physician Ricard Dillon, MD Debridement Type: Debridement Severity of Tissue Pre Fat layer exposed Debridement: Pre-procedure Verification/Time Yes - 16:13 Out Taken: Start Time: 16:14 Total Area Debrided (L x W): 1.1 (cm) x 1.3 (cm) = 1.43 (cm) Tissue and other material Viable, Non-Viable, Slough, Subcutaneous, Slough debrided: Level: Skin/Subcutaneous Tissue Debridement Description: Excisional Instrument: Curette Bleeding: Large Hemostasis Achieved: Pressure End Time: 16:15 Procedural Pain: 0 Post Procedural Pain: 0 Response to Treatment: Procedure was tolerated well Post Debridement  Measurements of Total Wound Length: (cm) 1.1 Width: (cm) 1.3 Depth: (cm) 0.4 Volume: (cm) 0.449 Character of Wound/Ulcer Post Debridement: Requires Further Debridement Severity of Tissue Post Debridement: Fat layer exposed Post Procedure Diagnosis Same as Pre-procedure Notes Patient is allergic to Lidocaine. Electronic Signature(s) Signed: 03/10/2018 5:02:04 PM By: Linton Ham MD Signed: 03/10/2018 5:03:55 PM By: Gretta Cool BSN, RN, CWS, Kim RN, BSN Entered By: Linton Ham on 03/10/2018 16:26:03 [name] ([id number]) -------------------------------------------------------------------------------- HPI Details Patient Name: [name] Date of Service: 03/10/2018 3:00 PM Medical Record Number: [id number] Patient Account Number: [id number] Date of Birth/Sex: 1936/09/11 (82 y.o. Female) Treating RN: Cornell Barman Primary Care Provider: Sherrie Mustache Other Clinician: Referring Provider: Sherrie Mustache Treating Provider/Extender: Tito Dine in Treatment: 0 History of Present Illness HPI Description: 03/10/18; is an 82 year old woman who is followed at [facility]. She is here for an open wound on the right posterior leg. Apparently this started in February with a fall. She was seen in the ER on 01/16/18 and the area was sutured. At some point the sutures were removed she was seen in her primary office on 02/09/18 at which time the wound had opened. It was felt to have cellulitis with redness drainage and tenderness and she was given doxycycline and Santyl was prescribed. they have been using Santyl and changing it daily. The patient has some form of dementia and I think is cared for by her brother. It is her daughter who brought her today. They tell us that she is sometimes reluctant to get out of bed or reluctant to go to appointments. patient is not a diabetic. ABIs in this clinic were 0.95 on the right The patient has dementia, congestive heart failure,  coronary artery disease, hypertension and COPD. She apparently falls fairly frequently. The reason for this is not completely clear Electronic Signature(s) Signed: 03/10/2018 5:02:04 PM By: Linton Ham MD Entered By: Linton Ham on 03/10/2018 16:44:55 [name] ([id number]) -------------------------------------------------------------------------------- Physical Exam Details Patient Name: [name] Date of Service:  03/10/2018 3:00 PM Medical Record Number: [id number] Patient Account Number: [id number] Date of Birth/Sex: March 05, 1936 (82 y.o. Female) Treating RN: Cornell Barman Primary Care Provider: Sherrie Mustache Other Clinician: Referring Provider: Sherrie Mustache Treating Provider/Extender: Ricard Dillon Weeks in Treatment: 0 Constitutional Sitting or standing Blood Pressure is within target range for patient.. Pulse regular and within target range for patient.Marland Kitchen Respirations regular, non-labored and within target range.. Temperature is normal and within the target range for the patient.Marland Kitchen appears in no distress. Eyes Conjunctivae clear. No discharge. Respiratory Respiratory effort is easy and symmetric bilaterally. Rate is normal at rest and on room air.. Bilateral breath sounds are clear and equal in all lobes with no wheezes, rales or rhonchi.. Cardiovascular Heart rhythm and rate regular, without murmur or gallop.. Femoral arteries without bruits and pulses strong.. the pulses are palpable on the right. Lymphatic none palpable in the popliteal or inguinal area. Integumentary (Hair, Skin) no systemic rashes seen. Psychiatric M degree of cognitive loss. Notes wound exam; the patient has a small area on the right posterior calf. This was 100% covered with slough which was adherent. Using a #3 curet the patient was debridement. Unfortunately this actually has some depth and is down to the muscle layer of her leg. There is undermining from 12 to 3:00 about 0.3  cm. There is no overt infection no surrounding soft tissue tenderness. The patient is allergic to lidocaine therefore we didn't have any topical anesthetic but she appears to of tolerated it well. Hemostasis with direct pressure Electronic Signature(s) Signed: 03/10/2018 5:02:04 PM By: Linton Ham MD Entered By: Linton Ham on 03/10/2018 16:47:26 [name] ([id number]) -------------------------------------------------------------------------------- Physician Orders Details Patient Name: [name] Date of Service: 03/10/2018 3:00 PM Medical Record Number: [id number] Patient Account Number: [id number] Date of Birth/Sex: October 03, 1936 (82 y.o. Female) Treating RN: Cornell Barman Primary Care Provider: Sherrie Mustache Other Clinician: Referring Provider: Sherrie Mustache Treating Provider/Extender: Tito Dine in Treatment: 0 Verbal / Phone Orders: No Diagnosis Coding Wound Cleansing Wound #1 Right,Posterior Lower Leg o Clean wound with Normal Saline. Anesthetic (add to Medication List) Wound #1 Right,Posterior Lower Leg o Topical Lidocaine 4% cream applied to wound bed prior to debridement (In Clinic Only). Primary Wound Dressing Wound #1 Right,Posterior Lower Leg o Santyl Ointment Secondary Dressing Wound #1 Right,Posterior Lower Leg o Dry Gauze o Boardered Foam Dressing Dressing Change Frequency Wound #1 Right,Posterior Lower Leg o Change dressing every day. Follow-up Appointments Wound #1 Right,Posterior Lower Leg o Return Appointment in 1 week. Medications-please add to medication list. Wound #1 Right,Posterior Lower Leg o Santyl Enzymatic Ointment Patient Medications Allergies: lidocaine, "All the Caines" Notifications Medication Indication Start End lidocaine DOSE topical 4 % cream - cream topical Santyl 03/10/2018 DOSE topical 250 unit/gram ointment - ointment topical apply to wound as directed change daily Electronic  Signature(s) DAJIAH, KOOI ([id number]) Signed: 03/10/2018 4:50:23 PM By: Linton Ham MD Entered By: Linton Ham on 03/10/2018 16:50:23 [name] ([id number]) -------------------------------------------------------------------------------- Prescription 03/10/2018 Patient Name: [name] Provider: Ricard Dillon MD Date of Birth: January 13, 1936 NPI#: [id number] Sex: Female DEA#: [id number] Phone #: [phone] License #: [id number] Patient Address: [facility] [place], [place] [address] [address], [place], [place] [address] [phone] Allergies lidocaine "All the Caines" Medication Medication: Route: Strength: Form: lidocaine 4 % topical cream topical 4% cream Class: TOPICAL LOCAL ANESTHETICS Dose: Frequency / Time: Indication: cream topical Number of Refills: Number  of Units: 0 Generic Substitution: Start Date: End Date: One Time Use: Substitution Permitted No Note to Pharmacy: Signature(s): Date(s): Electronic Signature(s) Signed: 03/10/2018 5:02:04 PM By: Linton Ham MD Entered By: Linton Ham on 03/10/2018 16:50:23 [name] ([id number]) --------------------------------------------------------------------------------  Problem List Details Patient Name: [name] Date of Service: 03/10/2018 3:00 PM Medical Record Number: [id number] Patient Account Number: [id number] Date of Birth/Sex: 19-Sep-1936 (82 y.o. 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 Date of Service: 03/10/2018 3:00 PM Medical Record Number: [id number] Patient Account Number: [id number] Date of Birth/Sex: January 05, 1936 (82 y.o. Female) Treating RN: Cornell Barman Primary Care Provider: Sherrie Mustache Other Clinician: Referring Provider: Sherrie Mustache Treating Provider/Extender: Tito Dine in Treatment: 0 Subjective Chief Complaint Information obtained from Patient 03/10/18; patient is here for review of a wound on her right posterior calf History of Present Illness (HPI) 03/10/18; is an 82 year old woman who is followed at [facility]. She is here for an open wound on the right posterior leg. Apparently this started in February with a fall. She was seen in the ER on 01/16/18 and the area was sutured. At some point the sutures were removed she was seen in her primary office on 02/09/18 at which time the wound had opened. It was felt to have cellulitis with redness drainage and tenderness and she was given doxycycline and Santyl was prescribed. they have been using Santyl and changing it daily. The patient has some form of dementia and I think is cared for by her brother. It is her daughter who brought her today. They tell us that she is sometimes reluctant to get out of bed or reluctant to go to appointments. patient is not a diabetic. ABIs in this clinic were 0.95 on the right The patient has dementia, congestive heart failure, coronary artery disease, hypertension and COPD. She apparently falls fairly frequently. The reason for this is not completely clear Wound History Patient  presents with 1 open wound that has been present for approximately 5 weeks. Patient has been treating wound in the following manner: santyl. Laboratory tests have not been performed in the last month. Patient reportedly has not tested positive for an antibiotic resistant organism. Patient reportedly has not tested positive for osteomyelitis. Patient reportedly has not had testing performed to evaluate circulation in the legs. Patient experiences the following problems associated with their wounds: infection. Patient History Information obtained from Patient, Caregiver. Allergies lidocaine, "All the Caines" Family History Cancer - Mother, Diabetes - Father,Mother, Heart Disease - Father, Hypertension - Father, No family history of Hereditary Spherocytosis, Kidney Disease, Lung Disease, Seizures, Stroke, Thyroid Problems, Tuberculosis. Social History Former smoker - 10 years, Marital Status - Single, Alcohol Use - Rarely, Drug Use - No History, Caffeine Use - Daily. Medical History Eyes Patient has history of Cataracts - removed Denies history of Glaucoma, Optic Neuritis Hematologic/Lymphatic Denies history of Anemia, Hemophilia, Human Immunodeficiency Virus, Lymphedema, Sickle Cell Disease Respiratory ADELIS, DOCTER A. ([id number]) Patient has  history of Chronic Obstructive Pulmonary Disease (COPD) Denies history of Aspiration, Asthma, Pneumothorax, Sleep Apnea, Tuberculosis Cardiovascular Patient has history of Congestive Heart Failure, Coronary Artery Disease, Hypertension Denies history of Angina, Arrhythmia, Deep Vein Thrombosis, Hypotension, Myocardial Infarction, Peripheral Arterial Disease, Peripheral Venous Disease, Phlebitis, Vasculitis Gastrointestinal Denies history of Cirrhosis , Colitis, Crohn s, Hepatitis A, Hepatitis B, Hepatitis C Endocrine Denies history of Type II Diabetes Genitourinary Denies history of End Stage Renal Disease Immunological Denies history of Lupus  Erythematosus, Raynaud s, Scleroderma Musculoskeletal Patient has history of Osteoarthritis Denies history of Gout, Rheumatoid Arthritis, Osteomyelitis Neurologic Denies history of Dementia, Neuropathy Oncologic Denies history of Received Chemotherapy, Received Radiation Medical And Surgical History Notes Neurologic memory disorder Review of Systems (ROS) Constitutional Symptoms (General Health) The patient has no complaints or symptoms. Eyes The patient has no complaints or symptoms. Ear/Nose/Mouth/Throat The patient has no complaints or symptoms. Hematologic/Lymphatic The patient has no complaints or symptoms. Cardiovascular Complains or has symptoms of LE edema. Gastrointestinal The patient has no complaints or symptoms. Endocrine The patient has no complaints or symptoms. Genitourinary The patient has no complaints or symptoms. Immunological The patient has no complaints or symptoms. Integumentary (Skin) The patient has no complaints or symptoms. Musculoskeletal The patient has no complaints or symptoms. Neurologic The patient has no complaints or symptoms. Oncologic The patient has no complaints or symptoms. Psychiatric The patient has no complaints or symptoms. JERLYN, PAIN A. ([id number]) Objective Constitutional Sitting or standing Blood Pressure is within target range for patient.. Pulse regular and within target range for patient.Marland Kitchen Respirations regular, non-labored and within target range.. Temperature is normal and within the target range for the patient.Marland Kitchen appears in no distress. Vitals Time Taken: 3:39 PM, Height: 63 in, Source: Measured, Weight: 196 lbs, Source: Measured, BMI: 34.7, Temperature: 98.5 F, Pulse: 88 bpm, Respiratory Rate: 18 breaths/min, Blood Pressure: 146/80 mmHg. Eyes Conjunctivae clear. No discharge. Respiratory Respiratory effort is easy and symmetric bilaterally. Rate is normal at rest and on room air.. Bilateral breath sounds are  clear and equal in all lobes with no wheezes, rales or rhonchi.. Cardiovascular Heart rhythm and rate regular, without murmur or gallop.. Femoral arteries without bruits and pulses strong.. the pulses are palpable on the right. Lymphatic none palpable in the popliteal or inguinal area. Psychiatric M degree of cognitive loss. General Notes: wound exam; the patient has a small area on the right posterior calf. This was 100% covered with slough which was adherent. Using a #3 curet the patient was debridement. Unfortunately this actually has some depth and is down to the muscle layer of her leg. There is undermining from 12 to 3:00 about 0.3 cm. There is no overt infection no surrounding soft tissue tenderness. The patient is allergic to lidocaine therefore we didn't have any topical anesthetic but she appears to of tolerated it well. Hemostasis with direct pressure Integumentary (Hair, Skin) no systemic rashes seen. Wound #1 status is Open. Original cause of wound was Trauma. The wound is located on the Right,Posterior Lower Leg. The wound measures 1.1cm length x 1.3cm width x 0.2cm depth; 1.123cm^2 area and 0.225cm^3 volume. There is Fat Layer (Subcutaneous Tissue) Exposed exposed. There is no tunneling noted, however, there is undermining starting at 12:00 and ending at 3:00 with a maximum distance of 0.2cm. There is a large amount of serous drainage noted. The wound margin is flat and intact. There is no granulation within the wound bed. There is a large (67-100%) amount of necrotic tissue within the wound  bed including Adherent Slough. The periwound skin appearance exhibited: Ecchymosis. The periwound skin appearance did not exhibit: Callus, Crepitus, Excoriation, Induration, Rash, Scarring, Dry/Scaly, Maceration, Atrophie Blanche, Cyanosis, Hemosiderin Staining, Mottled, Pallor, Rubor, Erythema. Periwound temperature was noted as No Abnormality. The periwound has tenderness on  palpation. Assessment Active Problems SREEJA, SPIES A. ([id number]) ICD-10 [phone] - 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scle S81.811D - Laceration without foreign body, right lower leg, subsequent encounter Procedures Wound #1 Pre-procedure diagnosis of Wound #1 is a Venous Leg Ulcer located on the Right,Posterior Lower Leg .Severity of Tissue Pre Debridement is: Fat layer exposed. There was a Excisional Skin/Subcutaneous Tissue Debridement with a total area of 1.43 sq cm performed by Ricard Dillon, MD. With the following instrument(s): Curette. to remove Viable and Non-Viable tissue/material Material removed includes Subcutaneous Tissue, and [place], and [employer]. No specimens were taken. A time out was conducted at 16:13, prior to the start of the procedure. A Large amount of bleeding was controlled with Pressure. The procedure was tolerated well with a pain level of 0 throughout and a pain level of 0 following the procedure. Post Debridement Measurements: 1.1cm length x 1.3cm width x 0.4cm depth; 0.449cm^3 volume. Character of Wound/Ulcer Post Debridement requires further debridement. Severity of Tissue Post Debridement is: Fat layer exposed. Post procedure Diagnosis Wound #1: Same as Pre-Procedure General Notes: Patient is allergic to Lidocaine.. Plan Wound Cleansing: Wound #1 Right,Posterior Lower Leg: Clean wound with Normal Saline. Anesthetic (add to Medication List): Wound #1 Right,Posterior Lower Leg: Topical Lidocaine 4% cream applied to wound bed prior to debridement (In Clinic Only). Primary Wound Dressing: Wound #1 Right,Posterior Lower Leg: Santyl Ointment Secondary Dressing: Wound #1 Right,Posterior Lower Leg: Dry Gauze Boardered Foam Dressing Dressing Change Frequency: Wound #1 Right,Posterior Lower Leg: Change dressing every day. Follow-up Appointments: Wound #1 Right,Posterior Lower Leg: Return Appointment in 1 week. Medications-please  add to medication list.: Wound #1 Right,Posterior Lower Leg: Santyl Enzymatic Ointment The following medication(s) was prescribed: lidocaine topical 4 % cream cream topical Santyl topical 250 unit/gram ointment ointment topical apply to wound as directed change daily starting 03/10/2018 ADLYNN, LOWENSTEIN A. ([id number]) #1 we continue the Santyl they already have.border foam cover change daily #2 I have prescribed a replacement tube. #3 the surface of this wound is not ready for anything else at this point although it maybe by next week after mechanical debridement. I think we would need Endoform #4 the patient does not have significant arterial insufficiency nor does she have venous insufficiency to a major degree. I didn't think compression was necessary here. #5 at one point this was infected last month and she received doxycycline I saw no evidence of that now Electronic Signature(s) Signed: 03/10/2018 4:50:43 PM By: Linton Ham MD Entered By: Linton Ham on 03/10/2018 16:50:43 [name] ([id number]) -------------------------------------------------------------------------------- ROS/PFSH Details Patient Name: [name] Date of Service: 03/10/2018 3:00 PM Medical Record Number: [id number] Patient Account Number: [id number] Date of Birth/Sex: 12-03-1935 (82 y.o. Female) Treating RN: Montey Hora Primary Care Provider: Sherrie Mustache Other Clinician: Referring Provider: Sherrie Mustache Treating Provider/Extender: Tito Dine in Treatment: 0 Information Obtained From Patient Caregiver Wound History Do you currently have one or more open woundso Yes How many open wounds do you currently haveo 1 Approximately how long have you had your woundso 5 weeks How have you been treating your wound(s) until nowo santyl Has your wound(s) ever healed and then re-openedo No Have you had any lab work  done in the past montho No Have you tested positive for an antibiotic  resistant organism (MRSA, VRE)o No Have you tested positive for osteomyelitis (bone infection)o No Have you had any tests for circulation on your legso No Have you had other problems associated with your woundso Infection Cardiovascular Complaints and Symptoms: Positive for: LE edema Medical History: Positive for: Congestive Heart Failure; Coronary Artery Disease; Hypertension Negative for: Angina; Arrhythmia; Deep Vein Thrombosis; Hypotension; Myocardial Infarction; Peripheral Arterial Disease; Peripheral Venous Disease; Phlebitis; Vasculitis Genitourinary Complaints and Symptoms: No Complaints or Symptoms Complaints and Symptoms: Negative for: Kidney failure/ Dialysis; Incontinence/dribbling Medical History: Negative for: End Stage Renal Disease Constitutional Symptoms (General Health) Complaints and Symptoms: No Complaints or Symptoms Eyes Complaints and Symptoms: No Complaints or Symptoms Medical History: Positive for: Cataracts - removed Negative for: Glaucoma; Optic Neuritis TAHANI, POTIER A. ([id number]) Ear/Nose/Mouth/Throat Complaints and Symptoms: No Complaints or Symptoms Hematologic/Lymphatic Complaints and Symptoms: No Complaints or Symptoms Medical History: Negative for: Anemia; Hemophilia; Human Immunodeficiency Virus; Lymphedema; Sickle Cell Disease Respiratory Medical History: Positive for: Chronic Obstructive Pulmonary Disease (COPD) Negative for: Aspiration; Asthma; Pneumothorax; Sleep Apnea; Tuberculosis Gastrointestinal Complaints and Symptoms: No Complaints or Symptoms Medical History: Negative for: Cirrhosis ; Colitis; Crohnos; Hepatitis A; Hepatitis B; Hepatitis C Endocrine Complaints and Symptoms: No Complaints or Symptoms Medical History: Negative for: Type II Diabetes Immunological Complaints and Symptoms: No Complaints or Symptoms Medical History: Negative for: Lupus Erythematosus; Raynaudos; Scleroderma Integumentary (Skin) Complaints  and Symptoms: No Complaints or Symptoms Musculoskeletal Complaints and Symptoms: No Complaints or Symptoms Medical History: Positive for: Osteoarthritis Negative for: Gout; Rheumatoid Arthritis; Osteomyelitis Neurologic Complaints and Symptoms: No Complaints or Symptoms Gauss, Aminata A. ([id number]) Medical History: Negative for: Dementia; Neuropathy Past Medical History Notes: memory disorder Oncologic Complaints and Symptoms: No Complaints or Symptoms Medical History: Negative for: Received Chemotherapy; Received Radiation Psychiatric Complaints and Symptoms: No Complaints or Symptoms HBO Extended History Items Eyes: Cataracts Immunizations Pneumococcal Vaccine: Received Pneumococcal Vaccination: Yes Implantable Devices Family and Social History Cancer: Yes - Mother; Diabetes: Yes - Father,Mother; Heart Disease: Yes - Father; Hereditary Spherocytosis: No; Hypertension: Yes - Father; Kidney Disease: No; Lung Disease: No; Seizures: No; Stroke: No; Thyroid Problems: No; Tuberculosis: No; Former smoker - 10 years; Marital Status - Single; Alcohol Use: Rarely; Drug Use: No History; Caffeine Use: Daily; Financial Concerns: No; Food, Clothing or Shelter Needs: No; Support System Lacking: No; Transportation Concerns: No; Advanced Directives: No; Patient does not want information on Advanced Directives Electronic Signature(s) Signed: 03/10/2018 5:02:04 PM By: Linton Ham MD Signed: 03/10/2018 5:03:40 PM By: Montey Hora Entered By: Montey Hora on 03/10/2018 15:39:44 [name] ([id number]) -------------------------------------------------------------------------------- SuperBill Details Patient Name: [name] Date of Service: 03/10/2018 Medical Record Number: [id number] Patient Account Number: [id number] Date of Birth/Sex: 05-11-1936 (82 y.o. Female) Treating RN: Cornell Barman Primary Care Provider: Sherrie Mustache Other Clinician: Referring Provider: Sherrie Mustache Treating Provider/Extender: Tito Dine in Treatment: 0 Diagnosis Coding ICD-10 Codes Code Description [phone] 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scle S81.811D Laceration without foreign body, right lower leg, subsequent encounter Facility Procedures CPT4 Code: 23762831 Description: 99213 - WOUND CARE VISIT-LEV 3 EST PT Modifier: Quantity: 1 CPT4 Code: 51761607 Description: 11042 - DEB SUBQ TISSUE 20 SQ CM/< ICD-10 Diagnosis Description L97.213 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 Modifier: scle Quantity: 1 Physician Procedures CPT4 Code: 3710626 Description: WC PHYS LEVEL 3 o NEW PT ICD-10 Diagnosis Description L97.213 Non-pressure chronic ulcer of right calf  with necrosis of mus S81.811D Laceration without foreign body, right lower leg, subsequent Modifier: 25 cle encounter Quantity: 1 CPT4 Code: 8372902 Description: 11155 - WC PHYS SUBQ TISS 20 SQ CM ICD-10 Diagnosis Description M08.022 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

## 2018-03-14 NOTE — Progress Notes (Signed)
Virginia [name], Virginia [name] ([id number]) Visit Report for 03/10/2018 Allergy List Details Patient Name: Virginia [name], Virginia A. Date of Service: 03/10/2018 3:00 PM Medical Record Number: [id number] Patient Account Number: [id number] Date of Birth/Sex: Apr 10, 1936 (82 y.o. Female) Treating RN: Montey Hora Primary Care Marshelle Bilger: Sherrie Mustache Other Clinician: Referring Analicia Skibinski: Sherrie Mustache Treating Jenafer Winterton/Extender: Ricard Dillon Weeks in Treatment: 0 Allergies Active Allergies lidocaine "All the [place]" Allergy Notes Electronic Signature(s) Signed: 03/10/2018 5:03:40 PM By: Montey Hora Entered By: Montey Hora on 03/10/2018 15:31:19 Virginia [name] ([id number]) -------------------------------------------------------------------------------- Arrival Information Details Patient Name: Virginia Harold A. Date of Service: 03/10/2018 3:00 PM Medical Record Number: [id number] Patient Account Number: [id number] Date of Birth/Sex: Dec 29, 1935 (82 y.o. Female) Treating RN: Montey Hora Primary Care Eloy Fehl: Sherrie Mustache Other Clinician: Referring Jihad Brownlow: Sherrie Mustache Treating Donnetta Gillin/Extender: Tito Dine in Treatment: 0 Visit Information Patient Arrived: Wheel Chair Arrival Time: 15:25 Accompanied By: dtr Transfer Assistance: Manual Patient Identification Verified: Yes Secondary Verification Process Completed: Yes Electronic Signature(s) Signed: 03/10/2018 5:03:40 PM By: Montey Hora Entered By: Montey Hora on 03/10/2018 15:30:15 Virginia [name] ([id number]) -------------------------------------------------------------------------------- Clinic Level of Care Assessment Details Patient Name: Virginia Harold A. Date of Service: 03/10/2018 3:00 PM Medical Record Number: [id number] Patient Account Number: [id number] Date of Birth/Sex: Jun 22, 1936 (82 y.o. Female) Treating RN: Cornell Barman Primary Care Paymon Rosensteel: Sherrie Mustache Other Clinician: Referring Gwyn Hieronymus: Sherrie Mustache Treating Omare Bilotta/Extender: Tito Dine in Treatment: 0 Clinic Level of Care Assessment Items TOOL 1 Quantity Score []  - Use when EandM and Procedure is performed on INITIAL visit 0 ASSESSMENTS - Nursing Assessment / Reassessment X - General Physical Exam (combine w/ comprehensive assessment (listed just below) when 1 20 performed on new pt. evals) X- 1 25 Comprehensive Assessment (HX, ROS, Risk Assessments, Wounds Hx, etc.) ASSESSMENTS - Wound and Skin Assessment / Reassessment []  - Dermatologic / Skin Assessment (not related to wound area) 0 ASSESSMENTS - Ostomy and/or Continence Assessment and Care []  - Incontinence Assessment and Management 0 []  - 0 Ostomy Care Assessment and Management (repouching, etc.) PROCESS - Coordination of Care X - Simple Patient / Family Education for ongoing care 1 15 []  - 0 Complex (extensive) Patient / Family Education for ongoing care X- 1 10 Staff obtains Programmer, systems, Records, Test Results / Process Orders []  - 0 Staff telephones HHA, Nursing Homes / Clarify orders / etc []  - 0 Routine Transfer to another Facility (non-emergent condition) []  - 0 Routine Hospital Admission (non-emergent condition) X- 1 15 New Admissions / Biomedical engineer / Ordering NPWT, Apligraf, etc. []  - 0 Emergency Hospital Admission (emergent condition) PROCESS - Special Needs []  - Pediatric / Minor Patient Management 0 []  - 0 Isolation Patient Management []  - 0 Hearing / Language / Visual special needs []  - 0 Assessment of Community assistance (transportation, D/C planning, etc.) []  - 0 Additional assistance / Altered mentation []  - 0 Support Surface(s) Assessment (bed, cushion, seat, etc.) Stamps, Asaiah A. ([id number]) INTERVENTIONS - Miscellaneous []  - External ear exam 0 []  - 0 Patient Transfer (multiple staff / Civil Service fast streamer / Similar devices) []  - 0 Simple Staple / Suture removal (25 or less) []  - 0 Complex Staple / Suture removal  (26 or more) []  - 0 Hypo/Hyperglycemic Management (do not check if billed separately) X- 1 15 Ankle / Brachial Index (ABI) - do not check if billed separately Has the patient been seen at the hospital within the last three years: Yes Total Score: 100 Level Of Care: New/Established - Level  3 Electronic Signature(s) Signed: 03/10/2018 5:03:55 PM By: Gretta Cool, BSN, RN, CWS, Kim RN, BSN Entered By: Gretta Cool, BSN, RN, CWS, Kim on 03/10/2018 16:17:25 Virginia [name] ([id number]) -------------------------------------------------------------------------------- Encounter Discharge Information Details Patient Name: Virginia Harold A. Date of Service: 03/10/2018 3:00 PM Medical Record Number: [id number] Patient Account Number: [id number] Date of Birth/Sex: 1936/03/16 (82 y.o. Female) Treating RN: Roger Shelter Primary Care Zakhi Dupre: Sherrie Mustache Other Clinician: Referring Lakendria Nicastro: Sherrie Mustache Treating Frankie Zito/Extender: Tito Dine in Treatment: 0 Encounter Discharge Information Items Discharge Pain Level: 0 Discharge Condition: Stable Ambulatory Status: Wheelchair Discharge Destination: Home Transportation: Private Auto Schedule Follow-up Appointment: Yes Medication Reconciliation completed and No provided to Patient/Care Claretha Townshend: Provided on Clinical Summary of Care: 03/10/2018 Form Type Recipient Paper Patient HI Electronic Signature(s) Signed: 03/12/2018 4:32:37 PM By: Ruthine Dose Entered By: Ruthine Dose on 03/10/2018 16:36:26 Virginia [name] ([id number]) -------------------------------------------------------------------------------- Lower Extremity Assessment Details Patient Name: Virginia Harold A. Date of Service: 03/10/2018 3:00 PM Medical Record Number: [id number] Patient Account Number: [id number] Date of Birth/Sex: 08/10/1936 (83 y.o. Female) Treating RN: Montey Hora Primary Care Yanni Ruberg: Sherrie Mustache Other Clinician: Referring Lyndsi Altic: Sherrie Mustache Treating Miah Boye/Extender: Tito Dine in Treatment: 0 Edema Assessment Assessed: [Left: No] [Right: No] Edema: [Left: N] [Right: o] Vascular Assessment Pulses: Dorsalis Pedis Palpable: [Right:Yes] Doppler Audible: [Right:Yes] Posterior Tibial Palpable: [Right:Yes] Doppler Audible: [Right:Yes] Extremity colors, hair growth, and conditions: Extremity Color: [Right:Hyperpigmented] Hair Growth on Extremity: [Right:No] Temperature of Extremity: [Right:Warm] Capillary Refill: [Right:< 3 seconds] Blood Pressure: Brachial: [Right:148] Dorsalis Pedis: [Left:Dorsalis Pedis: 140] Ankle: Posterior Tibial: [Left:Posterior Tibial: 138] [Right:0.95] Toe Nail Assessment Left: Right: Thick: Yes Discolored: No Deformed: No Improper Length and Hygiene: No Electronic Signature(s) Signed: 03/10/2018 5:03:40 PM By: Montey Hora Entered By: Montey Hora on 03/10/2018 15:54:53 Virginia [name] ([id number]) -------------------------------------------------------------------------------- Multi Wound Chart Details Patient Name: Virginia Harold A. Date of Service: 03/10/2018 3:00 PM Medical Record Number: [id number] Patient Account Number: [id number] Date of Birth/Sex: 1936-06-02 (82 y.o. Female) Treating RN: Cornell Barman Primary Care Arlene Brickel: Sherrie Mustache Other Clinician: Referring Simrit Gohlke: Sherrie Mustache Treating Hovanes Hymas/Extender: Tito Dine in Treatment: 0 Vital Signs Height(in): 63 Pulse(bpm): 66 Weight(lbs): 196 Blood Pressure(mmHg): 146/80 Body Mass Index(BMI): 35 Temperature(F): 98.5 Respiratory Rate 18 (breaths/min): Photos: [1:No Photos] [N/A:N/A] Wound Location: [1:Right Lower Leg - Posterior] [N/A:N/A] Wounding Event: [1:Trauma] [N/A:N/A] Primary Etiology: [1:Venous Leg Ulcer] [N/A:N/A] Comorbid History: [1:Cataracts, Chronic Obstructive N/A Pulmonary Disease (COPD), Congestive Heart Failure, Coronary Artery Disease, Hypertension,  Osteoarthritis] Date Acquired: [1:01/30/2018] [N/A:N/A] Weeks of Treatment: [1:0] [N/A:N/A] Wound Status: [1:Open] [N/A:N/A] Measurements L x W x D [1:1.1x1.3x0.2] [N/A:N/A] (cm) Area (cm) : [1:1.123] [N/A:N/A] Volume (cm) : [1:0.225] [N/A:N/A] % Reduction in Area: [1:0.00%] [N/A:N/A] % Reduction in Volume: [1:0.00%] [N/A:N/A] Starting Position 1 [1:12] (o'clock): Ending Position 1 [1:3] (o'clock): Maximum Distance 1 (cm): [1:0.2] Undermining: [1:Yes] [N/A:N/A] Classification: [1:Full Thickness Without Exposed Support Structures] [N/A:N/A] Exudate Amount: [1:Large] [N/A:N/A] Exudate Type: [1:Serous] [N/A:N/A] Exudate Color: [1:amber] [N/A:N/A] Wound Margin: [1:Flat and Intact] [N/A:N/A] Granulation Amount: [1:None Present (0%)] [N/A:N/A] Necrotic Amount: [1:Large (67-100%)] [N/A:N/A] Exposed Structures: [1:Fat Layer (Subcutaneous Tissue) Exposed: Yes Fascia: No Tendon: No Muscle: No] [N/A:N/A] Joint: No Bone: No Epithelialization: None N/A N/A Debridement: Debridement - Excisional N/A N/A Pre-procedure 16:13 N/A N/A Verification/Time Out Taken: Tissue Debrided: Subcutaneous, Slough N/A N/A Level: Skin/Subcutaneous Tissue N/A N/A Debridement Area (sq cm): 1.43 N/A N/A Instrument: Curette N/A N/A Bleeding: Large N/A N/A Hemostasis Achieved: Pressure N/A N/A Procedural Pain: 0 N/A N/A Post Procedural Pain:  0 N/A N/A Debridement Treatment Procedure was tolerated well N/A N/A Response: Post Debridement 1.1x1.3x0.4 N/A N/A Measurements L x W x D (cm) Post Debridement Volume: 0.449 N/A N/A (cm) Periwound Skin Texture: Excoriation: No N/A N/A Induration: No Callus: No Crepitus: No Rash: No Scarring: No Periwound Skin Moisture: Maceration: No N/A N/A Dry/Scaly: No Periwound Skin Color: Ecchymosis: Yes N/A N/A Atrophie Blanche: No Cyanosis: No Erythema: No Hemosiderin Staining: No Mottled: No Pallor: No Rubor: No Temperature: No Abnormality N/A  N/A Tenderness on Palpation: Yes N/A N/A Wound Preparation: Ulcer Cleansing: N/A N/A Rinsed/Irrigated with Saline Topical Anesthetic Applied: None Procedures Performed: Debridement N/A N/A Treatment Notes Electronic Signature(s) Signed: 03/10/2018 5:02:04 PM By: Linton Ham MD Entered By: Linton Ham on 03/10/2018 16:24:52 Virginia [name] ([id number]) -------------------------------------------------------------------------------- Multi-Disciplinary Care Plan Details Patient Name: Virginia Harold A. Date of Service: 03/10/2018 3:00 PM Medical Record Number: [id number] Patient Account Number: [id number] Date of Birth/Sex: March 11, 1936 (82 y.o. Female) Treating RN: Cornell Barman Primary Care Trinidad Ingle: Sherrie Mustache Other Clinician: Referring Tadeusz Stahl: Sherrie Mustache Treating Timoty Bourke/Extender: Tito Dine in Treatment: 0 Active Inactive ` Abuse / Safety / Falls / Self Care Management Nursing Diagnoses: History of Falls Potential for falls Goals: Patient will not experience any injury related to falls Date Initiated: 03/10/2018 Target Resolution Date: 04/09/2018 Goal Status: Active Interventions: Podiatry chair, stretcher in low position and side rails up as needed Assess fall risk on admission and as needed Notes: ` Orientation to the Wound Care Program Nursing Diagnoses: Knowledge deficit related to the wound healing center program Goals: Patient/caregiver will verbalize understanding of the [place] Date Initiated: 03/10/2018 Target Resolution Date: 04/09/2018 Goal Status: Active Interventions: Provide education on orientation to the wound center Notes: ` Soft Tissue Infection Nursing Diagnoses: Impaired tissue integrity Potential for infection: soft tissue Goals: Patient will remain free of wound infection Date Initiated: 03/10/2018 Target Resolution Date: 04/09/2018 Virginia [name], Virginia [name] ([id number]) Goal Status:  Active Interventions: Assess signs and symptoms of infection every visit Notes: ` Wound/Skin Impairment Nursing Diagnoses: Impaired tissue integrity Goals: Ulcer/skin breakdown will have a volume reduction of 80% by week 12 Date Initiated: 03/10/2018 Target Resolution Date: 07/10/2018 Goal Status: Active Interventions: Provide education on ulcer and skin care Treatment Activities: Skin care regimen initiated : 03/10/2018 Topical wound management initiated : 03/10/2018 Notes: Electronic Signature(s) Signed: 03/10/2018 5:03:55 PM By: Gretta Cool, BSN, RN, CWS, Kim RN, BSN Entered By: Gretta Cool, BSN, RN, CWS, Kim on 03/10/2018 16:11:20 Virginia [name] ([id number]) -------------------------------------------------------------------------------- Pain Assessment Details Patient Name: Virginia Harold A. Date of Service: 03/10/2018 3:00 PM Medical Record Number: [id number] Patient Account Number: [id number] Date of Birth/Sex: 17-Apr-1936 (82 y.o. Female) Treating RN: Montey Hora Primary Care Shericka Johnstone: Sherrie Mustache Other Clinician: Referring Kullen Tomasetti: Sherrie Mustache Treating Jadelin Eng/Extender: Tito Dine in Treatment: 0 Active Problems Location of Pain Severity and Description of Pain Patient Has Paino No Site Locations Pain Management and Medication Current Pain Management: Electronic Signature(s) Signed: 03/10/2018 5:03:40 PM By: Montey Hora Entered By: Montey Hora on 03/10/2018 15:30:30 Virginia [name] ([id number]) -------------------------------------------------------------------------------- Patient/Caregiver Education Details Patient Name: Virginia Harold A. Date of Service: 03/10/2018 3:00 PM Medical Record Number: [id number] Patient Account Number: [id number] Date of Birth/Gender: 1936-06-29 (82 y.o. Female) Treating RN: Roger Shelter Primary Care Physician: Sherrie Mustache Other Clinician: Referring Physician: Sherrie Mustache Treating Physician/Extender:  Tito Dine in Treatment: 0 Education Assessment Education Provided To: Patient Education Topics Provided Wound Debridement: Handouts: Wound Debridement Methods: Explain/Verbal Responses: State content correctly  Wound/Skin Impairment: Handouts: Caring for Your Ulcer Methods: Explain/Verbal Responses: State content correctly Electronic Signature(s) Signed: 03/12/2018 4:04:12 PM By: Roger Shelter Entered By: Roger Shelter on 03/10/2018 16:29:10 Virginia [name] ([id number]) -------------------------------------------------------------------------------- Wound Assessment Details Patient Name: Virginia Harold A. Date of Service: 03/10/2018 3:00 PM Medical Record Number: [id number] Patient Account Number: [id number] Date of Birth/Sex: Jan 05, 1936 (82 y.o. Female) Treating RN: Montey Hora Primary Care Kindal Ponti: Sherrie Mustache Other Clinician: Referring Katlyn Muldrew: Sherrie Mustache Treating Vinay Ertl/Extender: Ricard Dillon Weeks in Treatment: 0 Wound Status Wound Number: 1 Primary Venous Leg Ulcer Etiology: Wound Location: Right Lower Leg - Posterior Wound Open Wounding Event: Trauma Status: Date Acquired: 01/30/2018 Comorbid Cataracts, Chronic Obstructive Pulmonary Weeks Of Treatment: 0 History: Disease (COPD), Congestive Heart Failure, Clustered Wound: No Coronary Artery Disease, Hypertension, Osteoarthritis Photos Photo Uploaded By: Montey Hora on 03/10/2018 17:02:52 Wound Measurements Length: (cm) 1.1 Width: (cm) 1.3 Depth: (cm) 0.2 Area: (cm) 1.123 Volume: (cm) 0.225 % Reduction in Area: 0% % Reduction in Volume: 0% Epithelialization: None Tunneling: No Undermining: Yes Starting Position (o'clock): 12 Ending Position (o'clock): 3 Maximum Distance: (cm) 0.2 Wound Description Full Thickness Without Exposed Support Classification: Structures Wound Margin: Flat and Intact Exudate Large Amount: Exudate Type: Serous Exudate Color:  amber Foul Odor After Cleansing: No Slough/Fibrino Yes Wound Bed Granulation Amount: None Present (0%) Exposed Structure Necrotic Amount: Large (67-100%) Fascia Exposed: No Virginia [name], Virginia A. ([id number]) Necrotic Quality: Adherent Slough Fat Layer (Subcutaneous Tissue) Exposed: Yes Tendon Exposed: No Muscle Exposed: No Joint Exposed: No Bone Exposed: No Periwound Skin Texture Texture Color No Abnormalities Noted: No No Abnormalities Noted: No Callus: No Atrophie Blanche: No Crepitus: No Cyanosis: No Excoriation: No Ecchymosis: Yes Induration: No Erythema: No Rash: No Hemosiderin Staining: No Scarring: No Mottled: No Pallor: No Moisture Rubor: No No Abnormalities Noted: No Dry / Scaly: No Temperature / Pain Maceration: No Temperature: No Abnormality Tenderness on Palpation: Yes Wound Preparation Ulcer Cleansing: Rinsed/Irrigated with Saline Topical Anesthetic Applied: None Treatment Notes Wound #1 (Right, Posterior Lower Leg) 1. Cleansed with: Clean wound with Normal Saline 2. Anesthetic Topical Lidocaine 4% cream to wound bed prior to debridement 4. Dressing Applied: Santyl Ointment 5. Secondary Dressing Applied Bordered Foam Dressing Electronic Signature(s) Signed: 03/10/2018 5:03:40 PM By: Montey Hora Signed: 03/10/2018 5:03:55 PM By: Gretta Cool, BSN, RN, CWS, Kim RN, BSN Entered By: Gretta Cool, BSN, RN, CWS, Kim on 03/10/2018 16:12:37 Virginia [name] ([id number]) -------------------------------------------------------------------------------- Vitals Details Patient Name: Virginia Harold A. Date of Service: 03/10/2018 3:00 PM Medical Record Number: [id number] Patient Account Number: [id number] Date of Birth/Sex: February 17, 1936 (82 y.o. Female) Treating RN: Montey Hora Primary Care Hadley Soileau: Sherrie Mustache Other Clinician: Referring Toula Miyasaki: Sherrie Mustache Treating Long Brimage/Extender: Tito Dine in Treatment: 0 Vital Signs Time Taken:  15:39 Temperature (F): 98.5 Height (in): 63 Pulse (bpm): 88 Source: Measured Respiratory Rate (breaths/min): 18 Weight (lbs): 196 Blood Pressure (mmHg): 146/80 Source: Measured Reference Range: 80 - 120 mg / dl Body Mass Index (BMI): 34.7 Electronic Signature(s) Signed: 03/10/2018 5:03:40 PM By: Montey Hora Entered By: Montey Hora on 03/10/2018 15:42:31

## 2018-03-17 ENCOUNTER — Encounter: Payer: Medicare Other | Admitting: Internal Medicine

## 2018-03-17 DIAGNOSIS — I2510 Atherosclerotic heart disease of native coronary artery without angina pectoris: Secondary | ICD-10-CM | POA: Diagnosis not present

## 2018-03-17 DIAGNOSIS — I872 Venous insufficiency (chronic) (peripheral): Secondary | ICD-10-CM | POA: Diagnosis not present

## 2018-03-17 DIAGNOSIS — I110 Hypertensive heart disease with heart failure: Secondary | ICD-10-CM | POA: Diagnosis not present

## 2018-03-17 DIAGNOSIS — L97212 Non-pressure chronic ulcer of right calf with fat layer exposed: Secondary | ICD-10-CM | POA: Diagnosis not present

## 2018-03-17 DIAGNOSIS — F0390 Unspecified dementia without behavioral disturbance: Secondary | ICD-10-CM | POA: Diagnosis not present

## 2018-03-17 DIAGNOSIS — L97213 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scle: Secondary | ICD-10-CM | POA: Diagnosis not present

## 2018-03-17 DIAGNOSIS — J449 Chronic obstructive pulmonary disease, unspecified: Secondary | ICD-10-CM | POA: Diagnosis not present

## 2018-03-17 DIAGNOSIS — I509 Heart failure, unspecified: Secondary | ICD-10-CM | POA: Diagnosis not present

## 2018-03-22 NOTE — Progress Notes (Signed)
IVERY, MICHALSKI ([id number]) Visit Report for 03/17/2018 Debridement Details Patient Name: CELIA, FRIEDLAND A. Date of Service: 03/17/2018 1:30 PM Medical Record Number: [id number] Patient Account Number: [id number] Date of Birth/Sex: 11/01/1936 (82 y.o. F) Treating RN: Cornell Barman Primary Care Provider: Sherrie Mustache Other Clinician: Referring Provider: Sherrie Mustache Treating Provider/Extender: Tito Dine in Treatment: 1 Debridement Performed for Wound #1 Right,Posterior Lower Leg Assessment: Performed By: Physician Ricard Dillon, MD Debridement Type: Chemical/Enzymatic/Mechanical Agent Used: Santyl Severity of Tissue Pre Fat layer exposed Debridement: Pre-procedure Verification/Time Yes - 14:53 Out Taken: Start Time: 14:53 Pain Control: Other : lidocaine 4% Instrument: Other : cotton tipped applictor Bleeding: None Hemostasis Achieved: Other Topical Anticoagulant : End Time: 14:54 Procedural Pain: 0 Post Procedural Pain: 0 Response to Treatment: Procedure was tolerated well Post Debridement Measurements of Total Wound Length: (cm) 0.5 Width: (cm) 0.7 Depth: (cm) 0.3 Volume: (cm) 0.082 Character of Wound/Ulcer Post Debridement: Stable Severity of Tissue Post Debridement: Fat layer exposed Post Procedure Diagnosis Same as Pre-procedure Electronic Signature(s) Signed: 03/17/2018 5:09:03 PM By: Linton Ham MD Signed: 03/17/2018 5:11:52 PM By: Gretta Cool, BSN, RN, CWS, Kim RN, BSN Previous Signature: 03/17/2018 2:55:10 PM Version By: Gretta Cool, BSN, RN, CWS, Kim RN, BSN Entered By: Linton Ham on 03/17/2018 15:26:04 [name] ([id number]) -------------------------------------------------------------------------------- HPI Details Patient Name: [name] Date of Service: 03/17/2018 1:30 PM Medical Record Number: [id number] Patient Account Number: [id number] Date of Birth/Sex: [birth year] (82 y.o. F) Treating RN: Cornell Barman Primary Care Provider:  Sherrie Mustache Other Clinician: Referring Provider: Sherrie Mustache Treating Provider/Extender: Tito Dine in Treatment: 1 History of Present Illness HPI Description: 03/10/18; is an 82 year old woman who is followed at [facility]. She is here for an open wound on the right posterior leg. Apparently this started in February with a fall. She was seen in the ER on 01/16/18 and the area was sutured. At some point the sutures were removed she was seen in her primary office on 02/09/18 at which time the wound had opened. It was felt to have cellulitis with redness drainage and tenderness and she was given doxycycline and Santyl was prescribed. they have been using Santyl and changing it daily. The patient has some form of dementia and I think is cared for by her brother. It is her daughter who brought her today. They tell us that she is sometimes reluctant to get out of bed or reluctant to go to appointments. patient is not a diabetic. ABIs in this clinic were 0.95 on the right The patient has dementia, congestive heart failure, coronary artery disease, hypertension and COPD. She apparently falls fairly frequently. The reason for this is not completely clear 03/17/18; small but punched out wound on the right posterior calf Electronic Signature(s) Signed: 03/17/2018 5:09:03 PM By: Linton Ham MD Entered By: Linton Ham on 03/17/2018 15:28:24 [name] ([id number]) -------------------------------------------------------------------------------- Physical Exam Details Patient Name: [name] Date of Service: 03/17/2018 1:30 PM Medical Record Number: [id number] Patient Account Number: [id number] Date of Birth/Sex: 1935/12/12 (82 y.o. F) Treating RN: Cornell Barman Primary Care Provider: Sherrie Mustache Other Clinician: Referring Provider: Sherrie Mustache Treating Provider/Extender: Ricard Dillon Weeks in Treatment: 1 Constitutional Sitting or standing Blood  Pressure is within target range for patient.. Pulse regular and within target range for patient.Marland Kitchen Respirations regular, non-labored and within target range.. Temperature is normal and within the target range for the patient.Marland Kitchen appears in no distress. Eyes Conjunctivae clear. No discharge. Respiratory Respiratory effort is  easy and symmetric bilaterally. Rate is normal at rest and on room air.. Cardiovascular pedal pulses are palpable on the right. Integumentary (Hair, Skin) no major skin issues are seen. Psychiatric No evidence of depression, anxiety, or agitation. Calm, cooperative, and communicative. Appropriate interactions and affect.. Notes wound exam; small area on the right posterior calf which was initially unrecognized trauma. It was sutured but then dehisced. We have a better looking wound bed using Santyl although we still are going to need to do mechanical debridement. There is undermining from 12 to 3:00 of 0.3 cm which is about the same as last time. The patient is allergic to lidocaine we are trying to get a non-lidocaine/cane base topical anesthetic. No debridement today Electronic Signature(s) Signed: 03/17/2018 5:09:03 PM By: Linton Ham MD Entered By: Linton Ham on 03/17/2018 15:44:43 [name] ([id number]) -------------------------------------------------------------------------------- Physician Orders Details Patient Name: [name] Date of Service: 03/17/2018 1:30 PM Medical Record Number: [id number] Patient Account Number: [id number] Date of Birth/Sex: 08-20-1936 (82 y.o. F) Treating RN: Cornell Barman Primary Care Provider: Sherrie Mustache Other Clinician: Referring Provider: Sherrie Mustache Treating Provider/Extender: Tito Dine in Treatment: 1 Verbal / Phone Orders: No Diagnosis Coding Wound Cleansing Wound #1 Right,Posterior Lower Leg o Clean wound with Normal Saline. Anesthetic (add to Medication List) Wound #1  Right,Posterior Lower Leg o Topical Lidocaine 4% cream applied to wound bed prior to debridement (In Clinic Only). Primary Wound Dressing Wound #1 Right,Posterior Lower Leg o Santyl Ointment Secondary Dressing Wound #1 Right,Posterior Lower Leg o Dry Gauze o Boardered Foam Dressing Dressing Change Frequency Wound #1 Right,Posterior Lower Leg o Change dressing every day. Follow-up Appointments Wound #1 Right,Posterior Lower Leg o Return Appointment in 1 week. Medications-please add to medication list. Wound #1 Right,Posterior Lower Leg o Santyl Enzymatic Ointment Electronic Signature(s) Signed: 03/17/2018 5:09:03 PM By: Linton Ham MD Signed: 03/17/2018 5:11:52 PM By: Gretta Cool, BSN, RN, CWS, Kim RN, BSN Entered By: Gretta Cool, BSN, RN, CWS, Kim on 03/17/2018 14:21:53 [name] ([id number]) -------------------------------------------------------------------------------- Problem List Details Patient Name: [name] Date of Service: 03/17/2018 1:30 PM Medical Record Number: [id number] Patient Account Number: [id number] Date of Birth/Sex: 06-21-1936 (82 y.o. 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 Date of Service:  03/17/2018 1:30 PM Medical Record Number: [id number] Patient Account Number: [id number] Date of Birth/Sex: [birth year] (82 y.o. F) Treating RN: Cornell Barman Primary Care Provider: Sherrie Mustache Other Clinician: Referring Provider: Sherrie Mustache Treating Provider/Extender: Tito Dine in Treatment: 1 Subjective History of Present Illness (HPI) 03/10/18; is an 82 year old woman who is followed at [facility]. She is here for an open wound on the right posterior leg. Apparently this started in February with a fall. She was seen in the ER on 01/16/18 and the area was sutured. At some point the sutures were removed she was seen in her primary office on 02/09/18 at which time the wound had opened. It was felt to have cellulitis with redness drainage  and tenderness and she was given doxycycline and Santyl was prescribed. they have been using Santyl and changing it daily. The patient has some form of dementia and I think is cared for by her brother. It is her daughter who brought her today. They tell us that she is sometimes reluctant to get out of bed or reluctant to go to appointments. patient is not a diabetic. ABIs in this clinic were 0.95 on the right The patient has dementia, congestive heart failure, coronary artery disease, hypertension and COPD. She apparently falls fairly frequently. The reason for this is not completely clear 03/17/18; small but punched out wound on the right posterior calf Objective Constitutional Sitting or standing Blood Pressure is within target range for patient.. Pulse regular and within target range for patient.Marland Kitchen Respirations regular, non-labored and within target range.. Temperature is normal and within the target range for the patient.Marland Kitchen appears in no distress. Vitals Time Taken: 1:58 PM, Height: 63 in, Weight: 196 lbs, BMI: 34.7, Temperature: 98.2 F, Pulse: 75 bpm, Respiratory Rate: 18 breaths/min, Blood Pressure: 108/50  mmHg. Eyes Conjunctivae clear. No discharge. Respiratory Respiratory effort is easy and symmetric bilaterally. Rate is normal at rest and on room air.. Cardiovascular pedal pulses are palpable on the right. Psychiatric No evidence of depression, anxiety, or agitation. Calm, cooperative, and communicative. Appropriate interactions and affect.. General Notes: wound exam; small area on the right posterior calf which was initially unrecognized trauma. It was sutured but then dehisced. We have a better looking wound bed using Santyl although we still are going to need to do mechanical Kissick, Shakela A. ([id number]) debridement. There is undermining from 12 to 3:00 of 0.3 cm which is about the same as last time. The patient is allergic to lidocaine we are trying to get a non-lidocaine/cane base topical anesthetic. No debridement today Integumentary (Hair, Skin) no major skin issues are seen. Wound #1 status is Open. Original cause of wound was Trauma. The wound is located on the Right,Posterior Lower Leg. The wound measures 0.5cm length x 0.7cm width x 0.3cm depth; 0.275cm^2 area and 0.082cm^3 volume. There is Fat Layer (Subcutaneous Tissue) Exposed exposed. There is no tunneling noted, however, there is undermining starting at 12:00 and ending at 12:00 with a maximum distance of 0.2cm. There is a large amount of serous drainage noted. The wound margin is flat and intact. There is no granulation within the wound bed. There is a large (67-100%) amount of necrotic tissue within the wound bed including Adherent Slough. The periwound skin appearance exhibited: Ecchymosis. The periwound skin appearance did not exhibit: Callus, Crepitus, Excoriation, Induration, Rash, Scarring, Dry/Scaly, Maceration, Atrophie Blanche, Cyanosis, Hemosiderin Staining, Mottled, Pallor, Rubor, Erythema. Periwound temperature was noted as No Abnormality. The periwound has tenderness on palpation. Assessment Active  Problems ICD-10 L97.213 - 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scle S81.811D - Laceration without foreign body, right lower leg, subsequent encounter Procedures Wound #1 Pre-procedure diagnosis of Wound #1 is a Venous Leg Ulcer located on the Right,Posterior Lower Leg .Severity of Tissue Pre Debridement is: Fat layer exposed. There was a Chemical/Enzymatic/Mechanical debridement performed by Ricard Dillon, MD. With the following instrument(s): cotton tipped applictor. after achieving pain control using Other (lidocaine 4%). Agent used was [employer]. A time out was conducted at 14:53, prior to the start of the procedure. There was no bleeding. The procedure was tolerated well with a pain level of 0 throughout and a pain level of 0 following the procedure. Post Debridement Measurements:  0.5cm length x 0.7cm width x 0.3cm depth; 0.082cm^3 volume. Character of Wound/Ulcer Post Debridement is stable. Severity of Tissue Post Debridement is: Fat layer exposed. Post procedure Diagnosis Wound #1: Same as Pre-Procedure Plan Wound Cleansing: Wound #1 Right,Posterior Lower Leg: Clean wound with Normal Saline. Anesthetic (add to Medication List): Wound #1 Right,Posterior Lower Leg: Topical Lidocaine 4% cream applied to wound bed prior to debridement (In Clinic Only). Primary Wound Dressing: NYDIA, YTUARTE ([id number]) Wound #1 Right,Posterior Lower Leg: Santyl Ointment Secondary Dressing: Wound #1 Right,Posterior Lower Leg: Dry Gauze Boardered Foam Dressing Dressing Change Frequency: Wound #1 Right,Posterior Lower Leg: Change dressing every day. Follow-up Appointments: Wound #1 Right,Posterior Lower Leg: Return Appointment in 1 week. Medications-please add to medication list.: Wound #1 Right,Posterior Lower Leg: Santyl Enzymatic Ointment #1 continue Santyl/border foam #2 initially unrecognized trauma while getting into bed. They think it may have been caused by  hitting the leg on some bedside stepping stool. Electronic Signature(s) Signed: 03/17/2018 5:09:03 PM By: Linton Ham MD Entered By: Linton Ham on 03/17/2018 15:45:30 [name] ([id number]) -------------------------------------------------------------------------------- SuperBill Details Patient Name: [name] Date of Service: 03/17/2018 Medical Record Number: [id number] Patient Account Number: [id number] Date of Birth/Sex: [birth year] (82 y.o. F) Treating RN: Cornell Barman Primary Care Provider: Sherrie Mustache Other Clinician: Referring Provider: Sherrie Mustache Treating Provider/Extender: Tito Dine in Treatment: 1 Diagnosis Coding ICD-10 Codes Code Description [phone] 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scle S81.811D Laceration without foreign body, right lower leg, subsequent encounter Facility Procedures CPT4 Code: [id number] Description: [phone] - DEBRIDE W/O ANES NON SELECT Modifier: Quantity: 1 Physician Procedures CPT4 Code: [id number] Description: 99357 - WC PHYS LEVEL 3 - EST PT ICD-10 Diagnosis Description L97.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

## 2018-03-23 ENCOUNTER — Telehealth: Payer: Self-pay

## 2018-03-23 NOTE — Telephone Encounter (Signed)
Phone call placed to patient's brother to offer to schedule visit with Palliative Care. VM left

## 2018-03-24 ENCOUNTER — Ambulatory Visit: Payer: Medicare Other | Admitting: Internal Medicine

## 2018-03-24 NOTE — Progress Notes (Signed)
Virginia [name], Virginia [name] ([id number]) Visit Report for 03/17/2018 Arrival Information Details Patient Name: Virginia [name], Virginia [name]. Date of Service: 03/17/2018 1:30 PM Medical Record Number: [id number] Patient Account Number: [id number] Date of Birth/Sex: Jun 28, 1936 (82 y.o. F) Treating RN: Roger Shelter Primary Care Aggie Douse: Sherrie Mustache Other Clinician: Referring Glynn Freas: Sherrie Mustache Treating Kieon Lawhorn/Extender: Tito Dine in Treatment: 1 Visit Information History Since Last Visit All ordered tests and consults were completed: No Patient Arrived: Wheel Chair Added or deleted any medications: No Arrival Time: 13:57 Any new allergies or adverse reactions: No Accompanied By: brother Had a fall or experienced change in No Transfer Assistance: None activities of daily living that may affect risk of falls: Signs or symptoms of abuse/neglect since last visito No Hospitalized since last visit: No Implantable device outside of the clinic excluding No cellular tissue based products placed in the center since last visit: Pain Present Now: No Electronic Signature(s) Signed: 03/17/2018 4:34:44 PM By: Roger Shelter Entered By: Roger Shelter on 03/17/2018 13:58:06 Virginia [name] ([id number]) -------------------------------------------------------------------------------- Clinic Level of Care Assessment Details Patient Name: Virginia Harold A. Date of Service: 03/17/2018 1:30 PM Medical Record Number: [id number] Patient Account Number: [id number] Date of Birth/Sex: [birth year] (82 y.o. F) Treating RN: Cornell Barman Primary Care Adeli Frost: Sherrie Mustache Other Clinician: Referring Sloka Volante: Sherrie Mustache Treating Mirabel Ahlgren/Extender: Tito Dine in Treatment: 1 Clinic Level of Care Assessment Items TOOL 4 Quantity Score []  - Use when only an EandM is performed on FOLLOW-UP visit 0 ASSESSMENTS - Nursing Assessment / Reassessment []  - Reassessment of Co-morbidities  (includes updates in patient status) 0 X- 1 5 Reassessment of Adherence to Treatment Plan ASSESSMENTS - Wound and Skin Assessment / Reassessment X - Simple Wound Assessment / Reassessment - one wound 1 5 []  - 0 Complex Wound Assessment / Reassessment - multiple wounds []  - 0 Dermatologic / Skin Assessment (not related to wound area) ASSESSMENTS - Focused Assessment []  - Circumferential Edema Measurements - multi extremities 0 []  - 0 Nutritional Assessment / Counseling / Intervention []  - 0 Lower Extremity Assessment (monofilament, tuning fork, pulses) []  - 0 Peripheral Arterial Disease Assessment (using hand held doppler) ASSESSMENTS - Ostomy and/or Continence Assessment and Care []  - Incontinence Assessment and Management 0 []  - 0 Ostomy Care Assessment and Management (repouching, etc.) PROCESS - Coordination of Care X - Simple Patient / Family Education for ongoing care 1 15 []  - 0 Complex (extensive) Patient / Family Education for ongoing care X- 1 10 Staff obtains Programmer, systems, Records, Test Results / Process Orders []  - 0 Staff telephones HHA, Nursing Homes / Clarify orders / etc []  - 0 Routine Transfer to another Facility (non-emergent condition) []  - 0 Routine Hospital Admission (non-emergent condition) []  - 0 New Admissions / Biomedical engineer / Ordering NPWT, Apligraf, etc. []  - 0 Emergency Hospital Admission (emergent condition) X- 1 10 Simple Discharge Coordination Virginia [name], Virginia A. ([id number]) []  - 0 Complex (extensive) Discharge Coordination PROCESS - Special Needs []  - Pediatric / Minor Patient Management 0 []  - 0 Isolation Patient Management []  - 0 Hearing / Language / Visual special needs []  - 0 Assessment of Community assistance (transportation, D/C planning, etc.) []  - 0 Additional assistance / Altered mentation []  - 0 Support Surface(s) Assessment (bed, cushion, seat, etc.) INTERVENTIONS - Wound Cleansing / Measurement X - Simple Wound  Cleansing - one wound 1 5 []  - 0 Complex Wound Cleansing - multiple wounds X- 1 5 Wound Imaging (photographs - any number of wounds) []  -  0 Wound Tracing (instead of photographs) X- 1 5 Simple Wound Measurement - one wound []  - 0 Complex Wound Measurement - multiple wounds INTERVENTIONS - Wound Dressings []  - Small Wound Dressing one or multiple wounds 0 X- 1 15 Medium Wound Dressing one or multiple wounds []  - 0 Large Wound Dressing one or multiple wounds []  - 0 Application of Medications - topical []  - 0 Application of Medications - injection INTERVENTIONS - Miscellaneous []  - External ear exam 0 []  - 0 Specimen Collection (cultures, biopsies, blood, body fluids, etc.) []  - 0 Specimen(s) / Culture(s) sent or taken to Lab for analysis []  - 0 Patient Transfer (multiple staff / Civil Service fast streamer / Similar devices) []  - 0 Simple Staple / Suture removal (25 or less) []  - 0 Complex Staple / Suture removal (26 or more) []  - 0 Hypo / Hyperglycemic Management (close monitor of Blood Glucose) []  - 0 Ankle / Brachial Index (ABI) - do not check if billed separately X- 1 5 Vital Signs Virginia [name], Virginia A. ([id number]) Has the patient been seen at the hospital within the last three years: Yes Total Score: 80 Level Of Care: New/Established - Level 3 Electronic Signature(s) Signed: 03/17/2018 5:11:52 PM By: Gretta Cool, BSN, RN, CWS, Kim RN, BSN Entered By: Gretta Cool, BSN, RN, CWS, Kim on 03/17/2018 14:22:43 Virginia [name] ([id number]) -------------------------------------------------------------------------------- Encounter Discharge Information Details Patient Name: Virginia Harold A. Date of Service: 03/17/2018 1:30 PM Medical Record Number: [id number] Patient Account Number: [id number] Date of Birth/Sex: Feb 11, 1936 (82 y.o. F) Treating RN: Cornell Barman Primary Care Casen Pryor: Sherrie Mustache Other Clinician: Referring Gwin Eagon: Sherrie Mustache Treating Kirkland Figg/Extender: Tito Dine in  Treatment: 1 Encounter Discharge Information Items Schedule Follow-up Appointment: No Medication Reconciliation completed and No provided to Patient/Care Sincerity Cedar: Provided on Clinical Summary of Care: 03/17/2018 Form Type Recipient Paper Patient HI Electronic Signature(s) Signed: 03/19/2018 3:53:00 PM By: Ruthine Dose Entered By: Ruthine Dose on 03/17/2018 14:33:14 Virginia [name] ([id number]) -------------------------------------------------------------------------------- Lower Extremity Assessment Details Patient Name: Virginia Harold A. Date of Service: 03/17/2018 1:30 PM Medical Record Number: [id number] Patient Account Number: [id number] Date of Birth/Sex: 1936-09-16 (82 y.o. F) Treating RN: Roger Shelter Primary Care Kiano Terrien: Sherrie Mustache Other Clinician: Referring Kenshin Splawn: Sherrie Mustache Treating Amelia Macken/Extender: Tito Dine in Treatment: 1 Vascular Assessment Claudication: Claudication Assessment [Right:None] Pulses: Dorsalis Pedis Palpable: [Right:Yes] Posterior Tibial Extremity colors, hair growth, and conditions: Extremity Color: [Right:Normal] Hair Growth on Extremity: [Right:Yes] Temperature of Extremity: [Right:Cool] Capillary Refill: [Right:< 3 seconds] Toe Nail Assessment Left: Right: Thick: No Discolored: No Deformed: No Improper Length and Hygiene: No Electronic Signature(s) Signed: 03/17/2018 4:34:44 PM By: Roger Shelter Entered By: Roger Shelter on 03/17/2018 14:05:48 Virginia [name] ([id number]) -------------------------------------------------------------------------------- Multi Wound Chart Details Patient Name: Virginia Harold A. Date of Service: 03/17/2018 1:30 PM Medical Record Number: [id number] Patient Account Number: [id number] Date of Birth/Sex: [birth year] (82 y.o. F) Treating RN: Cornell Barman Primary Care Waldemar Siegel: Sherrie Mustache Other Clinician: Referring Gevork Ayyad: Sherrie Mustache Treating Farrel Guimond/Extender:  Tito Dine in Treatment: 1 Vital Signs Height(in): 63 Pulse(bpm): 75 Weight(lbs): 196 Blood Pressure(mmHg): 108/50 Body Mass Index(BMI): 35 Temperature(F): 98.2 Respiratory Rate 18 (breaths/min): Photos: [1:No Photos] [N/A:N/A] Wound Location: [1:Right Lower Leg - Posterior] [N/A:N/A] Wounding Event: [1:Trauma] [N/A:N/A] Primary Etiology: [1:Venous Leg Ulcer] [N/A:N/A] Comorbid History: [1:Cataracts, Chronic Obstructive N/A Pulmonary Disease (COPD), Congestive Heart Failure, Coronary Artery Disease, Hypertension, Osteoarthritis] Date Acquired: [1:01/30/2018] [N/A:N/A] Weeks of Treatment: [1:1] [N/A:N/A] Wound Status: [1:Open] [N/A:N/A] Measurements L x W x D [1:0.5x0.7x0.3] [N/A:N/A] (  cm) Area (cm) : [1:0.275] [N/A:N/A] Volume (cm) : [1:0.082] [N/A:N/A] % Reduction in Area: [1:75.50%] [N/A:N/A] % Reduction in Volume: [1:63.60%] [N/A:N/A] Starting Position 1 [1:12] (o'clock): Ending Position 1 [1:12] (o'clock): Maximum Distance 1 (cm): [1:0.2] Undermining: [1:Yes] [N/A:N/A] Classification: [1:Full Thickness Without Exposed Support Structures] [N/A:N/A] Exudate Amount: [1:Large] [N/A:N/A] Exudate Type: [1:Serous] [N/A:N/A] Exudate Color: [1:amber] [N/A:N/A] Wound Margin: [1:Flat and Intact] [N/A:N/A] Granulation Amount: [1:None Present (0%)] [N/A:N/A] Necrotic Amount: [1:Large (67-100%)] [N/A:N/A] Exposed Structures: [1:Fat Layer (Subcutaneous Tissue) Exposed: Yes Fascia: No Tendon: No Muscle: No] [N/A:N/A] Joint: No Bone: No Epithelialization: None N/A N/A Debridement: Chemical/Enzymatic/Mechanical N/A N/A Pre-procedure 14:53 N/A N/A Verification/Time Out Taken: Pain Control: Other N/A N/A Instrument: Other(cotton tipped applictor) N/A N/A Bleeding: None N/A N/A Hemostasis Achieved: Other Topical Anticoagulant N/A N/A Procedural Pain: 0 N/A N/A Post Procedural Pain: 0 N/A N/A Debridement Treatment Procedure was tolerated well N/A  N/A Response: Post Debridement 0.5x0.7x0.3 N/A N/A Measurements L x W x D (cm) Post Debridement Volume: 0.082 N/A N/A (cm) Periwound Skin Texture: Excoriation: No N/A N/A Induration: No Callus: No Crepitus: No Rash: No Scarring: No Periwound Skin Moisture: Maceration: No N/A N/A Dry/Scaly: No Periwound Skin Color: Ecchymosis: Yes N/A N/A Atrophie Blanche: No Cyanosis: No Erythema: No Hemosiderin Staining: No Mottled: No Pallor: No Rubor: No Temperature: No Abnormality N/A N/A Tenderness on Palpation: Yes N/A N/A Wound Preparation: Ulcer Cleansing: N/A N/A Rinsed/Irrigated with Saline Topical Anesthetic Applied: None Procedures Performed: Debridement N/A N/A Treatment Notes Electronic Signature(s) Signed: 03/17/2018 5:09:03 PM By: Linton Ham MD Entered By: Linton Ham on 03/17/2018 15:25:39 Virginia [name] ([id number]) -------------------------------------------------------------------------------- Multi-Disciplinary Care Plan Details Patient Name: Virginia Harold A. Date of Service: 03/17/2018 1:30 PM Medical Record Number: [id number] Patient Account Number: [id number] Date of Birth/Sex: [birth year] (82 y.o. F) Treating RN: Cornell Barman Primary Care Rhia Blatchford: Sherrie Mustache Other Clinician: Referring Geni Skorupski: Sherrie Mustache Treating Sparrow Siracusa/Extender: Tito Dine in Treatment: 1 Active Inactive ` Abuse / Safety / Falls / Self Care Management Nursing Diagnoses: History of Falls Potential for falls Goals: Patient will not experience any injury related to falls Date Initiated: 03/10/2018 Target Resolution Date: 04/09/2018 Goal Status: Active Interventions: Podiatry chair, stretcher in low position and side rails up as needed Assess fall risk on admission and as needed Notes: ` Orientation to the Wound Care Program Nursing Diagnoses: Knowledge deficit related to the wound healing center program Goals: Patient/caregiver will verbalize  understanding of the [place] Date Initiated: 03/10/2018 Target Resolution Date: 04/09/2018 Goal Status: Active Interventions: Provide education on orientation to the wound center Notes: ` Soft Tissue Infection Nursing Diagnoses: Impaired tissue integrity Potential for infection: soft tissue Goals: Patient will remain free of wound infection Date Initiated: 03/10/2018 Target Resolution Date: 04/09/2018 Virginia [name], Virginia [name] ([id number]) Goal Status: Active Interventions: Assess signs and symptoms of infection every visit Notes: ` Wound/Skin Impairment Nursing Diagnoses: Impaired tissue integrity Goals: Ulcer/skin breakdown will have a volume reduction of 80% by week 12 Date Initiated: 03/10/2018 Target Resolution Date: 07/10/2018 Goal Status: Active Interventions: Provide education on ulcer and skin care Treatment Activities: Skin care regimen initiated : 03/10/2018 Topical wound management initiated : 03/10/2018 Notes: Electronic Signature(s) Signed: 03/17/2018 5:11:52 PM By: Gretta Cool, BSN, RN, CWS, Kim RN, BSN Entered By: Gretta Cool, BSN, RN, CWS, Kim on 03/17/2018 14:17:13 Virginia [name] ([id number]) -------------------------------------------------------------------------------- Pain Assessment Details Patient Name: Virginia Harold A. Date of Service: 03/17/2018 1:30 PM Medical Record Number: [id number] Patient Account Number: [id number] Date of Birth/Sex: 1936-07-24 (82 y.o. F)  Treating RN: Roger Shelter Primary Care Sherleen Pangborn: Sherrie Mustache Other Clinician: Referring Aundraya Dripps: Sherrie Mustache Treating Sherif Millspaugh/Extender: Tito Dine in Treatment: 1 Active Problems Location of Pain Severity and Description of Pain Patient Has Paino No Site Locations Pain Management and Medication Current Pain Management: Electronic Signature(s) Signed: 03/17/2018 4:34:44 PM By: Roger Shelter Entered By: Roger Shelter on 03/17/2018 13:58:13 Virginia [name]  ([id number]) -------------------------------------------------------------------------------- Wound Assessment Details Patient Name: Virginia Harold A. Date of Service: 03/17/2018 1:30 PM Medical Record Number: [id number] Patient Account Number: [id number] Date of Birth/Sex: 1936-09-11 (82 y.o. F) Treating RN: Roger Shelter Primary Care Kamron Vanwyhe: Sherrie Mustache Other Clinician: Referring Lilias Lorensen: Sherrie Mustache Treating Marinda Tyer/Extender: Tito Dine in Treatment: 1 Wound Status Wound Number: 1 Primary Venous Leg Ulcer Etiology: Wound Location: Right Lower Leg - Posterior Wound Open Wounding Event: Trauma Status: Date Acquired: 01/30/2018 Comorbid Cataracts, Chronic Obstructive Pulmonary Weeks Of Treatment: 1 History: Disease (COPD), Congestive Heart Failure, Clustered Wound: No Coronary Artery Disease, Hypertension, Osteoarthritis Photos Photo Uploaded By: Roger Shelter on 03/17/2018 16:30:56 Wound Measurements Length: (cm) 0.5 Width: (cm) 0.7 Depth: (cm) 0.3 Area: (cm) 0.275 Volume: (cm) 0.082 % Reduction in Area: 75.5% % Reduction in Volume: 63.6% Epithelialization: None Tunneling: No Undermining: Yes Starting Position (o'clock): 12 Ending Position (o'clock): 12 Maximum Distance: (cm) 0.2 Wound Description Full Thickness Without Exposed Support Classification: Structures Wound Margin: Flat and Intact Exudate Large Amount: Exudate Type: Serous Exudate Color: amber Foul Odor After Cleansing: No Slough/Fibrino Yes Wound Bed Granulation Amount: None Present (0%) Exposed Structure Necrotic Amount: Large (67-100%) Fascia Exposed: No Virginia [name], Virginia A. ([id number]) Necrotic Quality: Adherent Slough Fat Layer (Subcutaneous Tissue) Exposed: Yes Tendon Exposed: No Muscle Exposed: No Joint Exposed: No Bone Exposed: No Periwound Skin Texture Texture Color No Abnormalities Noted: No No Abnormalities Noted: No Callus: No Atrophie Blanche:  No Crepitus: No Cyanosis: No Excoriation: No Ecchymosis: Yes Induration: No Erythema: No Rash: No Hemosiderin Staining: No Scarring: No Mottled: No Pallor: No Moisture Rubor: No No Abnormalities Noted: No Dry / Scaly: No Temperature / Pain Maceration: No Temperature: No Abnormality Tenderness on Palpation: Yes Wound Preparation Ulcer Cleansing: Rinsed/Irrigated with Saline Topical Anesthetic Applied: None Electronic Signature(s) Signed: 03/17/2018 4:34:44 PM By: Roger Shelter Entered By: Roger Shelter on 03/17/2018 14:04:43 Virginia [name] ([id number]) -------------------------------------------------------------------------------- Vitals Details Patient Name: Virginia Harold A. Date of Service: 03/17/2018 1:30 PM Medical Record Number: [id number] Patient Account Number: [id number] Date of Birth/Sex: 12/30/1935 (82 y.o. F) Treating RN: Roger Shelter Primary Care Princesa Willig: Sherrie Mustache Other Clinician: Referring Azaylea Maves: Sherrie Mustache Treating Corianne Buccellato/Extender: Tito Dine in Treatment: 1 Vital Signs Time Taken: 13:58 Temperature (F): 98.2 Height (in): 63 Pulse (bpm): 75 Weight (lbs): 196 Respiratory Rate (breaths/min): 18 Body Mass Index (BMI): 34.7 Blood Pressure (mmHg): 108/50 Reference Range: 80 - 120 mg / dl Electronic Signature(s) Signed: 03/17/2018 4:34:44 PM By: Roger Shelter Entered By: Roger Shelter on 03/17/2018 13:59:57

## 2018-03-31 ENCOUNTER — Encounter: Payer: Medicare Other | Attending: Internal Medicine | Admitting: Internal Medicine

## 2018-03-31 DIAGNOSIS — J449 Chronic obstructive pulmonary disease, unspecified: Secondary | ICD-10-CM | POA: Diagnosis not present

## 2018-03-31 DIAGNOSIS — I872 Venous insufficiency (chronic) (peripheral): Secondary | ICD-10-CM | POA: Diagnosis not present

## 2018-03-31 DIAGNOSIS — F0390 Unspecified dementia without behavioral disturbance: Secondary | ICD-10-CM | POA: Insufficient documentation

## 2018-03-31 DIAGNOSIS — I509 Heart failure, unspecified: Secondary | ICD-10-CM | POA: Diagnosis not present

## 2018-03-31 DIAGNOSIS — L97312 Non-pressure chronic ulcer of right ankle with fat layer exposed: Secondary | ICD-10-CM | POA: Insufficient documentation

## 2018-03-31 DIAGNOSIS — R296 Repeated falls: Secondary | ICD-10-CM | POA: Diagnosis not present

## 2018-03-31 DIAGNOSIS — I110 Hypertensive heart disease with heart failure: Secondary | ICD-10-CM | POA: Diagnosis not present

## 2018-03-31 DIAGNOSIS — I2510 Atherosclerotic heart disease of native coronary artery without angina pectoris: Secondary | ICD-10-CM | POA: Insufficient documentation

## 2018-03-31 DIAGNOSIS — L97212 Non-pressure chronic ulcer of right calf with fat layer exposed: Secondary | ICD-10-CM | POA: Diagnosis not present

## 2018-03-31 DIAGNOSIS — L97213 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scle: Secondary | ICD-10-CM | POA: Diagnosis present

## 2018-04-01 ENCOUNTER — Telehealth: Payer: Self-pay

## 2018-04-01 NOTE — Telephone Encounter (Signed)
Notes sent to scheduling.   

## 2018-04-02 ENCOUNTER — Encounter: Payer: Medicare Other | Admitting: Physician Assistant

## 2018-04-02 ENCOUNTER — Other Ambulatory Visit
Admission: RE | Admit: 2018-04-02 | Discharge: 2018-04-02 | Disposition: A | Discharge status: Home / Self Care | Payer: Medicare Other | Source: Ambulatory Visit | Attending: Physician Assistant | Admitting: Physician Assistant

## 2018-04-02 DIAGNOSIS — B999 Unspecified infectious disease: Secondary | ICD-10-CM | POA: Diagnosis not present

## 2018-04-02 DIAGNOSIS — F0390 Unspecified dementia without behavioral disturbance: Secondary | ICD-10-CM | POA: Diagnosis not present

## 2018-04-02 DIAGNOSIS — I2510 Atherosclerotic heart disease of native coronary artery without angina pectoris: Secondary | ICD-10-CM | POA: Diagnosis not present

## 2018-04-02 DIAGNOSIS — L539 Erythematous condition, unspecified: Secondary | ICD-10-CM | POA: Diagnosis not present

## 2018-04-02 DIAGNOSIS — L97312 Non-pressure chronic ulcer of right ankle with fat layer exposed: Secondary | ICD-10-CM | POA: Diagnosis not present

## 2018-04-02 DIAGNOSIS — I509 Heart failure, unspecified: Secondary | ICD-10-CM | POA: Diagnosis not present

## 2018-04-02 DIAGNOSIS — I110 Hypertensive heart disease with heart failure: Secondary | ICD-10-CM | POA: Diagnosis not present

## 2018-04-02 DIAGNOSIS — S81801A Unspecified open wound, right lower leg, initial encounter: Secondary | ICD-10-CM | POA: Diagnosis not present

## 2018-04-02 DIAGNOSIS — J449 Chronic obstructive pulmonary disease, unspecified: Secondary | ICD-10-CM | POA: Diagnosis not present

## 2018-04-03 NOTE — Progress Notes (Signed)
INICE, SANLUIS ([id number]) Visit Report for 03/31/2018 Debridement Details Patient Name: Virginia [name], Virginia A. Date of Service: 03/31/2018 1:00 PM Medical Record Number: [id number] Patient Account Number: [id number] Date of Birth/Sex: Apr 03, 1936 (82 y.o. F) Treating RN: Montey Hora Primary Care Provider: Sherrie Mustache Other Clinician: Referring Provider: Sherrie Mustache Treating Provider/Extender: Tito Dine in Treatment: 3 Debridement Performed for Wound #1 Right,Posterior Lower Leg Assessment: Performed By: Physician Ricard Dillon, MD Debridement Type: Debridement Severity of Tissue Pre Fat layer exposed Debridement: Pre-procedure Verification/Time Yes - 13:59 Out Taken: Start Time: 13:59 Pain Control: Other : painease Total Area Debrided (L x W): 0.7 (cm) x 0.9 (cm) = 0.63 (cm) Tissue and other material Viable, Non-Viable, Slough, Subcutaneous, Slough debrided: Level: Skin/Subcutaneous Tissue Debridement Description: Excisional Instrument: Curette Bleeding: Minimum Hemostasis Achieved: Pressure End Time: 14:02 Procedural Pain: 0 Post Procedural Pain: 0 Response to Treatment: Procedure was tolerated well Post Debridement Measurements of Total Wound Length: (cm) 0.7 Width: (cm) 0.9 Depth: (cm) 1.1 Volume: (cm) 0.544 Character of Wound/Ulcer Post Debridement: Improved Severity of Tissue Post Debridement: Fat layer exposed Post Procedure Diagnosis Same as Pre-procedure Electronic Signature(s) Signed: 03/31/2018 4:28:35 PM By: Linton Ham MD Signed: 03/31/2018 4:48:19 PM By: Montey Hora Entered By: Montey Hora on 03/31/2018 14:03:51 [name] ([id number]) -------------------------------------------------------------------------------- HPI Details Patient Name: Virginia Harold A. Date of Service: 03/31/2018 1:00 PM Medical Record Number: [id number] Patient Account Number: [id number] Date of Birth/Sex: 06-05-1936 (82 y.o. F) Treating RN:  Montey Hora Primary Care Provider: Sherrie Mustache Other Clinician: Referring Provider: Sherrie Mustache Treating Provider/Extender: Tito Dine in Treatment: 3 History of Present Illness HPI Description: 03/10/18; is an 82 year old woman who is followed at [facility]. She is here for an open wound on the right posterior leg. Apparently this started in February with a fall. She was seen in the ER on 01/16/18 and the area was sutured. At some point the sutures were removed she was seen in her primary office on 02/09/18 at which time the wound had opened. It was felt to have cellulitis with redness drainage and tenderness and she was given doxycycline and Santyl was prescribed. they have been using Santyl and changing it daily. The patient has some form of dementia and I think is cared for by her brother. It is her daughter who brought her today. They tell us that she is sometimes reluctant to get out of bed or reluctant to go to appointments. patient is not a diabetic. ABIs in this clinic were 0.95 on the right The patient has dementia, congestive heart failure, coronary artery disease, hypertension and COPD. She apparently falls fairly frequently. The reason for this is not completely clear 03/17/18; small but punched out wound on the right posterior calf 03/31/18; small but punched out wound on the right posterior calf. We've been using Santyl. This is initially felt to be traumatic on a bed frame although nobody is completely certain about that they simply noticed blood on the bed. Electronic Signature(s) Signed: 03/31/2018 4:28:35 PM By: Linton Ham MD Entered By: Linton Ham on 03/31/2018 14:29:40 [name] ([id number]) -------------------------------------------------------------------------------- Physical Exam Details Patient Name: Virginia Harold A. Date of Service: 03/31/2018 1:00 PM Medical Record Number: [id number] Patient Account Number: [id number] Date  of Birth/Sex: [birth year] (82 y.o. F) Treating RN: Montey Hora Primary Care Provider: Sherrie Mustache Other Clinician: Referring Provider: Sherrie Mustache Treating Provider/Extender: Tito Dine in Treatment: 3 Constitutional Pulse regular and within target range for patient.Marland Kitchen Respirations regular,  non-labored and within target range.. Temperature is normal and within the target range for the patient.Marland Kitchen appears in no distress. Cardiovascular needle pulses are present but somewhat anemic. Notes exam; small punched out area on the right posterior calf which was felt initially to be unrecognized trauma. It was sutured but then the has. We have a better looking wound bed using Santyl. Mechanical debridement with a #3 curet of necrotic subcutaneous tissue. This goes down to muscle at the base. Post debridement everything looks reasonably healthy Electronic Signature(s) Signed: 03/31/2018 4:28:35 PM By: Linton Ham MD Entered By: Linton Ham on 03/31/2018 14:30:59 [name] ([id number]) -------------------------------------------------------------------------------- Physician Orders Details Patient Name: Virginia Harold A. Date of Service: 03/31/2018 1:00 PM Medical Record Number: [id number] Patient Account Number: [id number] Date of Birth/Sex: [birth year] (82 y.o. F) Treating RN: Montey Hora Primary Care Provider: Sherrie Mustache Other Clinician: Referring Provider: Sherrie Mustache Treating Provider/Extender: Tito Dine in Treatment: 3 Verbal / Phone Orders: No Diagnosis Coding Wound Cleansing Wound #1 Right,Posterior Lower Leg o Clean wound with Normal Saline. Anesthetic (add to Medication List) Wound #1 Right,Posterior Lower Leg o Other: - PainEase Spray Primary Wound Dressing Wound #1 Right,Posterior Lower Leg o Santyl Ointment Secondary Dressing Wound #1 Right,Posterior Lower Leg o Dry Gauze o Boardered Foam Dressing Dressing  Change Frequency Wound #1 Right,Posterior Lower Leg o Change dressing every day. Follow-up Appointments Wound #1 Right,Posterior Lower Leg o Return Appointment in 1 week. Medications-please add to medication list. Wound #1 Right,Posterior Lower Leg o Santyl Enzymatic Ointment Services and Therapies o Arterial Studies- Bilateral Electronic Signature(s) Signed: 03/31/2018 4:28:35 PM By: Linton Ham MD Signed: 03/31/2018 4:48:19 PM By: Montey Hora Entered By: Montey Hora on 03/31/2018 16:07:52 [name] ([id number]) -------------------------------------------------------------------------------- Problem List Details Patient Name: Virginia Harold A. Date of Service: 03/31/2018 1:00 PM Medical Record Number: [id number] Patient Account Number: [id number] Date of Birth/Sex: 02/21/1936 (82 y.o. 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 Date of Service: 03/31/2018 1:00 PM Medical Record Number: [id number] Patient Account Number: [id number] Date of Birth/Sex: 1936-07-29 (82 y.o. F) Treating RN: Montey Hora Primary Care Provider: Sherrie Mustache Other Clinician: Referring Provider: Sherrie Mustache Treating Provider/Extender:  Tito Dine in Treatment: 3 Subjective History of Present Illness (HPI) 03/10/18; is an 82 year old woman who is followed at [facility]. She is here for an open wound on the right posterior leg. Apparently this started in February with a fall. She was seen in the ER on 01/16/18 and the area was sutured. At some point the sutures were removed she was seen in her primary office on 02/09/18 at which time the wound had opened. It was felt to have cellulitis with redness drainage and tenderness and she was given doxycycline and Santyl was prescribed. they have been using Santyl and changing it daily. The patient has some form of dementia and I think is cared for by her brother. It is her daughter who brought her today. They tell us that  she is sometimes reluctant to get out of bed or reluctant to go to appointments. patient is not a diabetic. ABIs in this clinic were 0.95 on the right The patient has dementia, congestive heart failure, coronary artery disease, hypertension and COPD. She apparently falls fairly frequently. The reason for this is not completely clear 03/17/18; small but punched out wound on the right posterior calf 03/31/18; small but punched out wound on the right posterior calf. We've been using Santyl. This is initially felt to be traumatic on a bed frame although nobody is completely certain about that they simply noticed blood on the bed. Objective Constitutional Pulse regular and within target range for patient.Marland Kitchen Respirations regular, non-labored and within target range.. Temperature is normal and within the target range for the patient.Marland Kitchen appears in no distress. Vitals Time Taken: 1:33 PM, Height: 63 in, Weight: 196 lbs, BMI: 34.7, Temperature: 98.4 F, Respiratory Rate: 18 breaths/min. General Notes: pt refused BP because she said its was too tight and hurt Cardiovascular needle pulses are present but somewhat anemic. General Notes: exam; small punched out  area on the right posterior calf which was felt initially to be unrecognized trauma. It was sutured but then the has. We have a better looking wound bed using Santyl. Mechanical debridement with a #3 curet of necrotic subcutaneous tissue. This goes down to muscle at the base. Post debridement everything looks reasonably healthy Integumentary (Hair, Skin) Wound #1 status is Open. Original cause of wound was Trauma. The wound is located on the Right,Posterior Lower Leg. The wound measures 0.7cm length x 0.9cm width x 0.4cm depth; 0.495cm^2 area and 0.198cm^3 volume. There is Fat Layer (Subcutaneous Tissue) Exposed exposed. There is no tunneling or undermining noted. There is a large amount of serous drainage noted. The wound margin is flat and intact. There is no granulation within the wound bed. There is a large (67-100%) Virginia [name], Virginia A. ([id number]) amount of necrotic tissue within the wound bed including Adherent Slough. The periwound skin appearance exhibited: Maceration, Ecchymosis. The periwound skin appearance did not exhibit: Callus, Crepitus, Excoriation, Induration, Rash, Scarring, Dry/Scaly, Atrophie Blanche, Cyanosis, Hemosiderin Staining, Mottled, Pallor, Rubor, Erythema. Periwound temperature was noted as No Abnormality. The periwound has tenderness on palpation. Assessment Active Problems ICD-10 L97.213 - 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scle S81.811D - Laceration without foreign body, right lower leg, subsequent encounter Procedures Wound #1 Pre-procedure diagnosis of Wound #1 is a Venous Leg Ulcer located on the Right,Posterior Lower Leg .Severity of Tissue Pre Debridement is: Fat layer exposed. There was a Excisional Skin/Subcutaneous Tissue Debridement with a total area of 0.63 sq cm performed by Ricard Dillon, MD. With the following instrument(s): Curette. to remove Viable and Non-Viable tissue/material Material removed includes Subcutaneous  Tissue, and Owen after achieving pain control using Other (painease). No specimens were taken. A time out was conducted at 13:59, prior to the start of the procedure. A Minimum amount of bleeding was controlled with Pressure. The procedure was tolerated well with a pain level of 0 throughout and a pain level of 0 following the procedure. Post Debridement Measurements: 0.7cm length x 0.9cm width x 1.1cm depth; 0.544cm^3 volume. Character of Wound/Ulcer Post Debridement is improved. Severity of Tissue Post Debridement is: Fat layer exposed. Post procedure Diagnosis Wound #1: Same as Pre-Procedure Plan Wound Cleansing: Wound #1 Right,Posterior Lower Leg: Clean wound with Normal Saline. Anesthetic (add to Medication List): Wound #1 Right,Posterior Lower Leg: Other: - PainEase Spray Primary Wound  Dressing: Wound #1 Right,Posterior Lower Leg: Santyl Ointment Secondary Dressing: Wound #1 Right,Posterior Lower Leg: Dry Gauze Boardered Foam Dressing Dressing Change Frequency: Wound #1 Right,Posterior Lower Leg: Change dressing every day. Virginia [name], Virginia [name] ([id number]) Follow-up Appointments: Wound #1 Right,Posterior Lower Leg: Return Appointment in 1 week. Medications-please add to medication list.: Wound #1 Right,Posterior Lower Leg: Santyl Enzymatic Ointment #1 continue Santyl for this week but consider changing to Endoform next week #2 although the history here is quite compelling for a traumatic laceration I wonder about her arterial perfusion. ABIs are clinic were 0.95 on the right however her pedal pulses are fairly faint both posterior tibial and dorsalis pedis and capillary refill time is delayed Electronic Signature(s) Signed: 03/31/2018 4:28:35 PM By: Linton Ham MD Entered By: Linton Ham on 03/31/2018 14:32:01 [name] ([id number]) -------------------------------------------------------------------------------- SuperBill Details Patient Name: Virginia Harold A. Date of Service: 03/31/2018 Medical Record Number: [id number] Patient Account Number: [id number] Date of Birth/Sex: August 07, 1936 (82 y.o. F) Treating RN: Montey Hora Primary Care Provider: Sherrie Mustache Other Clinician: Referring Provider: Sherrie Mustache Treating Provider/Extender: Tito Dine in Treatment: 3 Diagnosis Coding ICD-10 Codes Code Description [phone] 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scle S81.811D Laceration without foreign body, right lower leg, subsequent encounter Facility Procedures CPT4 Code: [id number] Description: 06004 - DEB SUBQ TISSUE 20 SQ CM/< ICD-10 Diagnosis Description L97.213 Non-pressure chronic ulcer of right calf with necrosis of m Modifier: uscle Quantity: 1 Physician Procedures CPT4 Code: 5997741 Description: 42395 - WC PHYS SUBQ TISS 20 SQ CM ICD-10 Diagnosis Description V20.233 Non-pressure chronic ulcer of right calf with necrosis of m Modifier: uscle Quantity: 1 Electronic Signature(s) Signed: 03/31/2018 2:32:58 PM By: Linton Ham MD Entered By: Linton Ham on 03/31/2018 14:32:58

## 2018-04-04 NOTE — Progress Notes (Signed)
CENIA, ZARAGOSA ([id number]) Visit Report for 03/31/2018 Arrival Information Details Patient Name: MONCHEL, POLLITT. Date of Service: 03/31/2018 1:00 PM Medical Record Number: [id number] Patient Account Number: [id number] Date of Birth/Sex: [birth year] (82 y.o. F) Treating RN: Ahmed Prima Primary Care Antoinne Spadaccini: Sherrie Mustache Other Clinician: Referring Gareth Fitzner: Sherrie Mustache Treating Bevin Das/Extender: Tito Dine in Treatment: 3 Visit Information History Since Last Visit All ordered tests and consults were completed: No Patient Arrived: Wheel Chair Added or deleted any medications: No Arrival Time: 13:32 Any new allergies or adverse reactions: No Accompanied By: brother Had a fall or experienced change in No Transfer Assistance: EasyPivot Patient activities of daily living that may affect Lift risk of falls: Patient Identification Verified: Yes Signs or symptoms of abuse/neglect since last visito No Secondary Verification Process Yes Hospitalized since last visit: No Completed: Implantable device outside of the clinic excluding No Patient Requires Transmission-Based No cellular tissue based products placed in the center Precautions: since last visit: Patient Has Alerts: No Has Dressing in Place as Prescribed: Yes Pain Present Now: No Electronic Signature(s) Signed: 03/31/2018 4:21:17 PM By: Roger Shelter Entered By: Roger Shelter on 03/31/2018 14:10:40 [name] ([id number]) -------------------------------------------------------------------------------- Encounter Discharge Information Details Patient Name: [name] Date of Service: 03/31/2018 1:00 PM Medical Record Number: [id number] Patient Account Number: [id number] Date of Birth/Sex: Apr 30, 1936 (82 y.o. F) Treating RN: Roger Shelter Primary Care Teighan Aubert: Sherrie Mustache Other Clinician: Referring Danniell Rotundo: Sherrie Mustache Treating Patricia Perales/Extender: Tito Dine in  Treatment: 3 Encounter Discharge Information Items Discharge Pain Level: 0 Discharge Condition: Stable Ambulatory Status: Wheelchair Discharge Destination: Home Transportation: Private Auto Schedule Follow-up Appointment: Yes Medication Reconciliation completed and No provided to Patient/Care Dustina Scoggin: Provided on Clinical Summary of Care: 03/31/2018 Form Type Recipient Paper Patient HI Electronic Signature(s) Signed: 04/01/2018 2:11:15 PM By: Ruthine Dose Entered By: Ruthine Dose on 03/31/2018 14:12:54 [name] ([id number]) -------------------------------------------------------------------------------- Lower Extremity Assessment Details Patient Name: [name] Date of Service: 03/31/2018 1:00 PM Medical Record Number: [id number] Patient Account Number: [id number] Date of Birth/Sex: 09-Oct-1936 (82 y.o. F) Treating RN: Ahmed Prima Primary Care Travis Purk: Sherrie Mustache Other Clinician: Referring Johncharles Fusselman: Sherrie Mustache Treating Tomie Spizzirri/Extender: Tito Dine in Treatment: 3 Vascular Assessment Pulses: Dorsalis Pedis Palpable: [Right:Yes] Posterior Tibial Extremity colors, hair growth, and conditions: Extremity Color: [Right:Normal] Temperature of Extremity: [Right:Cool] Capillary Refill: [Right:< 3 seconds] Toe Nail Assessment Left: Right: Thick: No Discolored: No Deformed: No Improper Length and Hygiene: No Electronic Signature(s) Signed: 03/31/2018 4:27:21 PM By: Alric Quan Entered By: Alric Quan on 03/31/2018 13:41:14 [name] ([id number]) -------------------------------------------------------------------------------- Multi Wound Chart Details Patient Name: [name] Date of Service: 03/31/2018 1:00 PM Medical Record Number: [id number] Patient Account Number: [id number] Date of Birth/Sex: September 28, 1936 (82 y.o. F) Treating RN: Montey Hora Primary Care Rhandi Despain: Sherrie Mustache Other Clinician: Referring  Tenae Graziosi: Sherrie Mustache Treating Saharra Santo/Extender: Tito Dine in Treatment: 3 Vital Signs Height(in): 63 Pulse(bpm): Weight(lbs): 196 Blood Pressure(mmHg): Body Mass Index(BMI): 35 Temperature(F): 98.4 Respiratory Rate 18 (breaths/min): Photos: [1:No Photos] [N/A:N/A] Wound Location: [1:Right Lower Leg - Posterior] [N/A:N/A] Wounding Event: [1:Trauma] [N/A:N/A] Primary Etiology: [1:Venous Leg Ulcer] [N/A:N/A] Comorbid History: [1:Cataracts, Chronic Obstructive N/A Pulmonary Disease (COPD), Congestive Heart Failure, Coronary Artery Disease, Hypertension, Osteoarthritis] Date Acquired: [1:01/30/2018] [N/A:N/A] Weeks of Treatment: [1:3] [N/A:N/A] Wound Status: [1:Open] [N/A:N/A] Measurements L x W x D [1:0.7x0.9x0.4] [N/A:N/A] (cm) Area (cm) : [1:0.495] [N/A:N/A] Volume (cm) : [1:0.198] [N/A:N/A] % Reduction in Area: [1:55.90%] [N/A:N/A] % Reduction in Volume: [1:12.00%] [  N/A:N/A] Classification: [1:Full Thickness Without Exposed Support Structures] [N/A:N/A] Exudate Amount: [1:Large] [N/A:N/A] Exudate Type: [1:Serous] [N/A:N/A] Exudate Color: [1:amber] [N/A:N/A] Wound Margin: [1:Flat and Intact] [N/A:N/A] Granulation Amount: [1:None Present (0%)] [N/A:N/A] Necrotic Amount: [1:Large (67-100%)] [N/A:N/A] Exposed Structures: [1:Fat Layer (Subcutaneous Tissue) Exposed: Yes Fascia: No Tendon: No Muscle: No Joint: No Bone: No] [N/A:N/A] Epithelialization: [1:None] [N/A:N/A] Debridement: [1:Debridement - Excisional] [N/A:N/A] Pre-procedure [1:13:59] [N/A:N/A] Verification/Time Out Taken: ALBANA, SAPERSTEIN ([id number]) Pain Control: Other N/A N/A Tissue Debrided: Subcutaneous, Slough N/A N/A Level: Skin/Subcutaneous Tissue N/A N/A Debridement Area (sq cm): 0.63 N/A N/A Instrument: Curette N/A N/A Bleeding: Minimum N/A N/A Hemostasis Achieved: Pressure N/A N/A Procedural Pain: 0 N/A N/A Post Procedural Pain: 0 N/A N/A Debridement Treatment Procedure was  tolerated well N/A N/A Response: Post Debridement 0.7x0.9x1.1 N/A N/A Measurements L x W x D (cm) Post Debridement Volume: 0.544 N/A N/A (cm) Periwound Skin Texture: Excoriation: No N/A N/A Induration: No Callus: No Crepitus: No Rash: No Scarring: No Periwound Skin Moisture: Maceration: Yes N/A N/A Dry/Scaly: No Periwound Skin Color: Ecchymosis: Yes N/A N/A Atrophie Blanche: No Cyanosis: No Erythema: No Hemosiderin Staining: No Mottled: No Pallor: No Rubor: No Temperature: No Abnormality N/A N/A Tenderness on Palpation: Yes N/A N/A Wound Preparation: Ulcer Cleansing: N/A N/A Rinsed/Irrigated with Saline Topical Anesthetic Applied: None Procedures Performed: Debridement N/A N/A Treatment Notes Wound #1 (Right, Posterior Lower Leg) 1. Cleansed with: Clean wound with Normal Saline 2. Anesthetic Topical Lidocaine 4% cream to wound bed prior to debridement 4. Dressing Applied: Santyl Ointment 5. Secondary Dressing Applied Bordered Foam Dressing Dry Gauze Electronic Signature(s) CHERYLYN, SUNDBY ([id number]) Signed: 03/31/2018 4:28:35 PM By: Linton Ham MD Entered By: Linton Ham on 03/31/2018 14:28:40 [name] ([id number]) -------------------------------------------------------------------------------- Multi-Disciplinary Care Plan Details Patient Name: [name] Date of Service: 03/31/2018 1:00 PM Medical Record Number: [id number] Patient Account Number: [id number] Date of Birth/Sex: 30-Apr-1936 (82 y.o. F) Treating RN: Montey Hora Primary Care Arrayah Connors: Sherrie Mustache Other Clinician: Referring Hubert Raatz: Sherrie Mustache Treating Wendee Hata/Extender: Tito Dine in Treatment: 3 Active Inactive ` Abuse / Safety / Falls / Self Care Management Nursing Diagnoses: History of Falls Potential for falls Goals: Patient will not experience any injury related to falls Date Initiated: 03/10/2018 Target Resolution Date: 04/09/2018 Goal  Status: Active Interventions: Podiatry chair, stretcher in low position and side rails up as needed Assess fall risk on admission and as needed Notes: ` Orientation to the Wound Care Program Nursing Diagnoses: Knowledge deficit related to the wound healing center program Goals: Patient/caregiver will verbalize understanding of the Star Date Initiated: 03/10/2018 Target Resolution Date: 04/09/2018 Goal Status: Active Interventions: Provide education on orientation to the wound center Notes: ` Soft Tissue Infection Nursing Diagnoses: Impaired tissue integrity Potential for infection: soft tissue Goals: Patient will remain free of wound infection Date Initiated: 03/10/2018 Target Resolution Date: 04/09/2018 LUTRICIA, WIDJAJA ([id number]) Goal Status: Active Interventions: Assess signs and symptoms of infection every visit Notes: ` Wound/Skin Impairment Nursing Diagnoses: Impaired tissue integrity Goals: Ulcer/skin breakdown will have a volume reduction of 80% by week 12 Date Initiated: 03/10/2018 Target Resolution Date: 07/10/2018 Goal Status: Active Interventions: Provide education on ulcer and skin care Treatment Activities: Skin care regimen initiated : 03/10/2018 Topical wound management initiated : 03/10/2018 Notes: Electronic Signature(s) Signed: 03/31/2018 4:48:19 PM By: Montey Hora Entered By: Montey Hora on 03/31/2018 14:01:57 [name] ([id number]) -------------------------------------------------------------------------------- Pain Assessment Details Patient Name: [name] Date of Service: 03/31/2018 1:00 PM Medical Record  Number: [id number] Patient Account Number: [id number] Date of Birth/Sex: 1935/12/29 (82 y.o. F) Treating RN: Ahmed Prima Primary Care Aileena Iglesia: Sherrie Mustache Other Clinician: Referring Shaya Reddick: Sherrie Mustache Treating Hokulani Rogel/Extender: Tito Dine in Treatment: 3 Active  Problems Location of Pain Severity and Description of Pain Patient Has Paino No Site Locations Pain Management and Medication Current Pain Management: Electronic Signature(s) Signed: 03/31/2018 4:27:21 PM By: Alric Quan Entered By: Alric Quan on 03/31/2018 13:33:20 [name] ([id number]) -------------------------------------------------------------------------------- Patient/Caregiver Education Details Patient Name: [name] Date of Service: 03/31/2018 1:00 PM Medical Record Number: [id number] Patient Account Number: [id number] Date of Birth/Gender: [birth year] (82 y.o. F) Treating RN: Roger Shelter Primary Care Physician: Sherrie Mustache Other Clinician: Referring Physician: Sherrie Mustache Treating Physician/Extender: Tito Dine in Treatment: 3 Education Assessment Education Provided To: Patient Education Topics Provided Wound Debridement: Handouts: Wound Debridement Methods: Explain/Verbal Responses: State content correctly Wound/Skin Impairment: Handouts: Caring for Your Ulcer Methods: Explain/Verbal Responses: State content correctly Electronic Signature(s) Signed: 03/31/2018 4:21:17 PM By: Roger Shelter Entered By: Roger Shelter on 03/31/2018 14:12:37 [name] ([id number]) -------------------------------------------------------------------------------- Wound Assessment Details Patient Name: [name] Date of Service: 03/31/2018 1:00 PM Medical Record Number: [id number] Patient Account Number: [id number] Date of Birth/Sex: 11/01/1936 (82 y.o. F) Treating RN: Ahmed Prima Primary Care Ludwika Rodd: Sherrie Mustache Other Clinician: Referring Ariya Bohannon: Sherrie Mustache Treating Alishba Naples/Extender: Tito Dine in Treatment: 3 Wound Status Wound Number: 1 Primary Venous Leg Ulcer Etiology: Wound Location: Right Lower Leg - Posterior Wound Open Wounding Event: Trauma Status: Date Acquired:  01/30/2018 Comorbid Cataracts, Chronic Obstructive Pulmonary Weeks Of Treatment: 3 History: Disease (COPD), Congestive Heart Failure, Clustered Wound: No Coronary Artery Disease, Hypertension, Osteoarthritis Wound Measurements Length: (cm) 0.7 Width: (cm) 0.9 Depth: (cm) 0.4 Area: (cm) 0.495 Volume: (cm) 0.198 % Reduction in Area: 55.9% % Reduction in Volume: 12% Epithelialization: None Tunneling: No Undermining: No Wound Description Full Thickness Without Exposed Support Classification: Structures Wound Margin: Flat and Intact Exudate Large Amount: Exudate Type: Serous Exudate Color: amber Foul Odor After Cleansing: No Slough/Fibrino Yes Wound Bed Granulation Amount: None Present (0%) Exposed Structure Necrotic Amount: Large (67-100%) Fascia Exposed: No Necrotic Quality: Adherent Slough Fat Layer (Subcutaneous Tissue) Exposed: Yes Tendon Exposed: No Muscle Exposed: No Joint Exposed: No Bone Exposed: No Periwound Skin Texture Texture Color No Abnormalities Noted: No No Abnormalities Noted: No Callus: No Atrophie Blanche: No Crepitus: No Cyanosis: No Excoriation: No Ecchymosis: Yes Induration: No Erythema: No Rash: No Hemosiderin Staining: No Scarring: No Mottled: No Pallor: No Moisture Rubor: No No Abnormalities Noted: No LATARIA, COURSER A. ([id number]) Dry / Scaly: No Temperature / Pain Maceration: Yes Temperature: No Abnormality Tenderness on Palpation: Yes Wound Preparation Ulcer Cleansing: Rinsed/Irrigated with Saline Topical Anesthetic Applied: None Electronic Signature(s) Signed: 03/31/2018 4:27:21 PM By: Alric Quan Entered By: Alric Quan on 03/31/2018 13:39:42 [name] ([id number]) -------------------------------------------------------------------------------- Vitals Details Patient Name: [name] Date of Service: 03/31/2018 1:00 PM Medical Record Number: [id number] Patient Account Number: [id number] Date of  Birth/Sex: Jan 08, 1936 (82 y.o. F) Treating RN: Ahmed Prima Primary Care Diana Armijo: Sherrie Mustache Other Clinician: Referring Martasia Talamante: Sherrie Mustache Treating Farren Landa/Extender: Tito Dine in Treatment: 3 Vital Signs Time Taken: 13:33 Temperature (F): 98.4 Height (in): 63 Respiratory Rate (breaths/min): 18 Weight (lbs): 196 Reference Range: 80 - 120 mg / dl Body Mass Index (BMI): 34.7 Notes pt refused BP because she said its was too tight and hurt Electronic Signature(s) Signed: 03/31/2018 4:27:21 PM By: Alric Quan Entered  By: Alric Quan on 03/31/2018 13:35:08

## 2018-04-05 ENCOUNTER — Other Ambulatory Visit (HOSPITAL_BASED_OUTPATIENT_CLINIC_OR_DEPARTMENT_OTHER): Payer: Self-pay | Admitting: Internal Medicine

## 2018-04-05 ENCOUNTER — Ambulatory Visit (INDEPENDENT_AMBULATORY_CARE_PROVIDER_SITE_OTHER): Payer: Medicare Other | Admitting: Adult Health

## 2018-04-05 ENCOUNTER — Encounter

## 2018-04-05 ENCOUNTER — Encounter: Payer: Self-pay | Admitting: Adult Health

## 2018-04-05 VITALS — BP 116/61 | HR 83 | Ht 63.0 in | Wt 201.0 lb

## 2018-04-05 DIAGNOSIS — R413 Other amnesia: Secondary | ICD-10-CM

## 2018-04-05 DIAGNOSIS — S81801A Unspecified open wound, right lower leg, initial encounter: Secondary | ICD-10-CM

## 2018-04-05 DIAGNOSIS — I6529 Occlusion and stenosis of unspecified carotid artery: Secondary | ICD-10-CM

## 2018-04-05 LAB — AEROBIC CULTURE W GRAM STAIN (SUPERFICIAL SPECIMEN)

## 2018-04-05 LAB — AEROBIC CULTURE  (SUPERFICIAL SPECIMEN)

## 2018-04-05 NOTE — Progress Notes (Signed)
PATIENT: Virginia [name] DOB: [birth year]  REASON FOR VISIT: follow up HISTORY FROM: patient  HISTORY OF PRESENT ILLNESS: Today 04/05/18 Virginia [name] is an 82 year old female with a history of memory disturbance.  She returns today for follow-up.  She is currently on Aricept and Namenda.  She continues to tolerate these medications well.  The patient does not feel that there is been much change in her memory.  She continues to live with her brother.  She does require some assistance with ADLs.  She has an aide that comes in twice a week to help her bathe.  She does not operate a motor vehicle.  She does have assistance with her finances.  Denies any trouble sleeping.  Reports occasionally she will have a vivid dream.  Denies hallucinations.  Denies any changes in her mood or behavior.  She returns today for evaluation.  HISTORY 11/19/17 Virginia [name] a 82 year old female with a history of memory disturbance.  She returns today for follow-up.  She remains on Aricept and Namenda and is tolerating the medications well.  She feels that her memory has remained stable.  He is able to complete all ADLs independently.  She denies any trouble sleeping.  She denies hallucinations denies any changes in her mood or behavior.  She lives with her brother.  He is just her with all her cooking, finances and medication management.  She denies any new symptoms.  She returns today for evaluation.    REVIEW OF SYSTEMS: Out of a complete 14 system review of symptoms, the patient complains only of the following symptoms, and all other reviewed systems are negative.  See HPI  ALLERGIES: Allergies  Allergen Reactions  . Lidocaine Anaphylaxis, Swelling, Rash and Other (See Comments)    Any of the " [facility] "  . Other Other (See Comments)    All drugs that end in "cane'-  novacane etc. Daughter states patient almost died when she had it when she was born    HOME MEDICATIONS: Outpatient Medications Prior to  Visit  Medication Sig Dispense Refill  . albuterol (PROVENTIL) (2.5 MG/3ML) 0.083% nebulizer solution Take 6 mLs (5 mg total) by nebulization every 4 (four) hours as needed for wheezing or shortness of breath. 75 mL 12  . ARTIFICIAL TEAR OP Place 2 drops into both eyes daily.     Marland Kitchen aspirin EC 81 MG tablet Take 81 mg by mouth every evening.    Marland Kitchen atorvastatin (LIPITOR) 20 MG tablet take 1 tablet by mouth once daily 90 tablet 3  . azelastine (ASTELIN) 0.1 % nasal spray Place 2 sprays into both nostrils 2 (two) times daily. Use in each nostril as directed 30 mL 5  . BREO ELLIPTA 100-25 MCG/INH AEPB inhale 1 puff INTO THE LUNGS daily 60 each 5  . BYSTOLIC 5 MG tablet take 1 tablet by mouth once daily 90 tablet 2  . Calcium Carbonate-Vitamin D (CALCIUM-D PO) Take 1 tablet by mouth every evening.    . Carbinoxamine Maleate ER [facility] ER) 4 MG/5ML SUER Take 7.5 mLs by mouth every 12 (twelve) hours as needed. 450 mL 5  . collagenase (SANTYL) ointment Apply 1 application topically daily. 15 g 0  . diclofenac sodium (VOLTAREN) 1 % GEL Apply 1 application topically 2 (two) times daily as needed (shoulder and knee pain). 4 Tube 1  . donepezil (ARICEPT) 10 MG tablet Take 1 tablet (10 mg total) by mouth daily. 90 tablet 1  . fluticasone (FLONASE)  50 MCG/ACT nasal spray Place 2 sprays into both nostrils daily. 16 g 0  . furosemide (LASIX) 20 MG tablet Take 1 tablet daily, take 1 tablet daily as needed, for weight gain of 3-5 lbs 135 tablet 3  . ipratropium (ATROVENT) 0.06 % nasal spray INSTILL 2 SPRAYS INTO EACH NOSTRIL TWICE A DAY 45 mL 0  . ipratropium-albuterol (DUONEB) 0.5-2.5 (3) MG/3ML SOLN inhale contents of 1 vial in nebulizer every 6 hours 360 mL 12  . memantine (NAMENDA) 10 MG tablet Take 1 tablet (10 mg total) by mouth 2 (two) times daily. 180 tablet 3  . Multiple Vitamins-Minerals (PRESERVISION AREDS 2) CAPS Take 1 capsule by mouth 2 (two) times daily.     . ondansetron (ZOFRAN ODT) 4 MG  disintegrating tablet Take 1 tablet (4 mg total) by mouth every 8 (eight) hours as needed for nausea or vomiting. 90 tablet 2  . pantoprazole (PROTONIX) 40 MG tablet Take 1 tablet (40 mg total) by mouth daily. 90 tablet 2  . Probiotic Product (PROBIOTIC DAILY PO) Take 1 capsule by mouth daily.    . ranitidine (ZANTAC) 150 MG tablet TAKE 1 TABLET BY MOUTH AT BEDTIME 90 tablet 0  . sulfamethoxazole-trimethoprim (BACTRIM DS,SEPTRA DS) 800-160 MG tablet TK 1 T PO  BID FOR 10 DAYS  0  . VAYACOG 100-19.5-6.5 MG CAPS take 1 capsule by mouth once daily 90 capsule 3   No facility-administered medications prior to visit.     PAST MEDICAL HISTORY: Past Medical History:  Diagnosis Date  . Arthritis   . Carotid artery disease ([place])    a. s/p L CEA. b. followed by [facility].  . Chronic diastolic CHF (congestive heart failure) ([place])   . CKD (chronic kidney disease), stage III ([place])   . COPD (chronic obstructive pulmonary disease) ([place])   . Diverticulosis   . Fall at home Sept. 2013  . Hyperlipidemia   . Hypertension   . Internal hemorrhoid   . Memory disorder 10/24/2014    PAST SURGICAL HISTORY: Past Surgical History:  Procedure Laterality Date  . APPENDECTOMY    . BREAST REDUCTION SURGERY    . CAROTID ENDARTERECTOMY     left CEA  . CATARACT EXTRACTION Bilateral   . COLONOSCOPY  2015  . EYE SURGERY     cataracts removed, bilaterally   . skin cancer removal    . TONSILLECTOMY      FAMILY HISTORY: Family History  Problem Relation Age of Onset  . Heart disease Father        Before age 12  . Hypertension Father   . Heart attack Father   . Cancer Mother 88       Brain  . Dementia Brother   . Cancer Maternal Aunt 61       breast cancer  . Diabetes Maternal Aunt   . Heart failure Maternal Grandmother   . Diabetes Maternal Grandmother   . Stroke Neg Hx     SOCIAL HISTORY: Social History   Socioeconomic History  . Marital status: Divorced    Spouse name: Not on file  . Number of  children: 2  . Years of education: 7  . Highest education level: Not on file  Occupational History  . Occupation: retired  Scientific laboratory technician  . Financial resource strain: Not on file  . Food insecurity:    Worry: Not on file    Inability: Not on file  . Transportation needs:    Medical: Not on file  Non-medical: Not on file  Tobacco Use  . Smoking status: Former Smoker    Packs/day: 0.25    Years: 20.00    Pack years: 5.00    Types: Cigarettes    Last attempt to quit: 11/30/1994    Years since quitting: 23.3  . Smokeless tobacco: Never Used  . Tobacco comment: quit  Substance and Sexual Activity  . Alcohol use: No    Alcohol/week: 0.0 oz  . Drug use: No  . Sexual activity: Never    Birth control/protection: Post-menopausal  Lifestyle  . Physical activity:    Days per week: Not on file    Minutes per session: Not on file  . Stress: Not on file  Relationships  . Social connections:    Talks on phone: Not on file    Gets together: Not on file    Attends religious service: Not on file    Active member of club or organization: Not on file    Attends meetings of clubs or organizations: Not on file    Relationship status: Not on file  . Intimate partner violence:    Fear of current or ex partner: Not on file    Emotionally abused: Not on file    Physically abused: Not on file    Forced sexual activity: Not on file  Other Topics Concern  . Not on file  Social History Narrative   04/05/18 lives with son and brother, [name]   Diet:       Do you drink/eat things with caffeine: yes      Marital Status: Divorced   What Year Married:1959      Do you live in a house, apartment, Assisted Living, Condo, trailer? House      Is it one or more stories? 2 stories      How many persons live in your home? Just Patient      Do you have any pets in your home? 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 Very Little   Type and how often? Walk      Do  you have a living will? None   DNR?   Discuss one? No      Do you have signed POA/HPOA forms?      Patient drinks 1 cup of caffeine daily.   Patient is right handed.      PHYSICAL EXAM  Vitals:   04/05/18 1351  BP: 116/61  Pulse: 83  Weight: 201 lb (91.2 kg)  Height: 5\' 3"  (1.6 m)   Body mass index is 35.61 kg/m.   MMSE - Mini Mental State Exam 04/05/2018 11/19/2017 05/13/2017  Orientation to time 2 4 0  Orientation to Place 3 3 4   Registration 3 3 3   Attention/ Calculation 0 1 5  Recall 0 0 0  Language- name 2 objects 2 2 2   Language- repeat 0 0 1  Language- follow 3 step command 3 3 3   Language- read & follow direction 1 1 1   Write a sentence 1 1 1   Copy design 0 0 1  Total score 15 18 21      Generalized: Well developed, in no acute distress   Neurological examination  Mentation: Alert.. Follows all commands speech and language fluent Cranial nerve II-XII: Pupils were equal round reactive to light. Extraocular movements were full, visual field were full on confrontational test. Facial sensation and strength were normal. Uvula tongue  midline. Head turning and shoulder shrug  were normal and symmetric. Motor: The motor testing reveals 5 over 5 strength of all 4 extremities. Good symmetric motor tone is noted throughout.  Sensory: Sensory testing is intact to soft touch on all 4 extremities. No evidence of extinction is noted.  Coordination: Cerebellar testing reveals good finger-nose-finger and heel-to-shin bilaterally.  Difficulty on the right due to swelling at the knee. Gait and station: Patient is in a wheelchair. Reflexes: Deep tendon reflexes are symmetric and normal bilaterally.   DIAGNOSTIC DATA (LABS, IMAGING, TESTING) - I reviewed patient records, labs, notes, testing and imaging myself where available.  Lab Results  Component Value Date   WBC 9.9 01/08/2018   HGB 13.7 01/08/2018   HCT 42.7 01/08/2018   MCV 88 01/08/2018   PLT 196 12/10/2017        Component Value Date/Time   NA 144 01/08/2018 1150   K 3.9 01/08/2018 1150   CL 106 01/08/2018 1150   CO2 25 01/08/2018 1150   GLUCOSE 112 (H) 01/08/2018 1150   GLUCOSE 80 12/10/2017 1349   BUN 19 01/08/2018 1150   CREATININE 1.17 (H) 01/08/2018 1150   CREATININE 1.29 (H) 12/10/2017 1349   CALCIUM 9.1 01/08/2018 1150   PROT 5.9 (L) 01/08/2018 1150   ALBUMIN 3.6 01/08/2018 1150   AST 19 01/08/2018 1150   ALT 17 01/08/2018 1150   ALKPHOS 72 01/08/2018 1150   BILITOT 0.8 01/08/2018 1150   GFRNONAA 44 (L) 01/08/2018 1150   GFRNONAA 39 (L) 12/10/2017 1349   GFRAA 50 (L) 01/08/2018 1150   GFRAA 46 (L) 12/10/2017 1349   Lab Results  Component Value Date   CHOL 204 (H) 12/10/2017   HDL 70 12/10/2017   LDLCALC 111 (H) 12/10/2017   LDLDIRECT 142.0 05/24/2015   TRIG 123 12/10/2017   CHOLHDL 2.9 12/10/2017   Lab Results  Component Value Date   HGBA1C 5.2 12/10/2017   Lab Results  Component Value Date   [id number] 716 10/24/2014   Lab Results  Component Value Date   TSH 0.888 08/13/2017      ASSESSMENT AND PLAN 82 y.o. year old female  has a past medical history of Arthritis, Carotid artery disease ([place]), Chronic diastolic CHF (congestive heart failure) ([place]), CKD (chronic kidney disease), stage III ([place]), COPD (chronic obstructive pulmonary disease) ([place]), Diverticulosis, Fall at home (Sept. 2013), Hyperlipidemia, Hypertension, Internal hemorrhoid, and Memory disorder (10/24/2014). here with:  1.  Memory disturbance  The patient's memory score has declined slightly.  She will continue on Aricept and Namenda.  I have encouraged the patient to remain socially active.  She should also engage in work puzzles and word searches for the memory.  I advised that if her symptoms worsen or she develops new symptoms she should let us know.  She will follow-up in 6 months or sooner if needed.  I spent 15 minutes with the patient. 50% of this time was spent reviewing memory  score  Ward Givens, MSN, NP-C 04/05/2018, 2:04 PM [facility] [address] [place], [place] [address] [phone]

## 2018-04-05 NOTE — Patient Instructions (Signed)
Your Plan:  Continue Aricept and Namenda Memory score has slightly declined. We will continue to monitor Remain social. Engage in puzzles, word searches etc.. If your symptoms worsen or you develop new symptoms please let us know.   Thank you for coming to see [place] at [facility]. I hope we have been able to provide you high quality care today.  You may receive a patient satisfaction survey over the next few weeks. We would appreciate your feedback and comments so that we may continue to improve ourselves and the health of our patients.

## 2018-04-05 NOTE — Progress Notes (Signed)
I have read the note, and I agree with the clinical assessment and plan.  Abrielle Finck K Teela Narducci   

## 2018-04-07 ENCOUNTER — Encounter: Payer: Medicare Other | Admitting: Internal Medicine

## 2018-04-07 ENCOUNTER — Ambulatory Visit: Payer: Medicare Other

## 2018-04-07 ENCOUNTER — Other Ambulatory Visit: Payer: Self-pay | Admitting: Allergy and Immunology

## 2018-04-07 ENCOUNTER — Ambulatory Visit: Payer: Medicare Other | Admitting: Nurse Practitioner

## 2018-04-07 DIAGNOSIS — J449 Chronic obstructive pulmonary disease, unspecified: Secondary | ICD-10-CM | POA: Diagnosis not present

## 2018-04-07 DIAGNOSIS — I872 Venous insufficiency (chronic) (peripheral): Secondary | ICD-10-CM | POA: Diagnosis not present

## 2018-04-07 DIAGNOSIS — L97312 Non-pressure chronic ulcer of right ankle with fat layer exposed: Secondary | ICD-10-CM | POA: Diagnosis not present

## 2018-04-07 DIAGNOSIS — I509 Heart failure, unspecified: Secondary | ICD-10-CM | POA: Diagnosis not present

## 2018-04-07 DIAGNOSIS — I110 Hypertensive heart disease with heart failure: Secondary | ICD-10-CM | POA: Diagnosis not present

## 2018-04-07 DIAGNOSIS — L97212 Non-pressure chronic ulcer of right calf with fat layer exposed: Secondary | ICD-10-CM | POA: Diagnosis not present

## 2018-04-07 DIAGNOSIS — F0390 Unspecified dementia without behavioral disturbance: Secondary | ICD-10-CM | POA: Diagnosis not present

## 2018-04-07 DIAGNOSIS — I2510 Atherosclerotic heart disease of native coronary artery without angina pectoris: Secondary | ICD-10-CM | POA: Diagnosis not present

## 2018-04-08 NOTE — Progress Notes (Signed)
IRETTA, MANGRUM ([id number]) Visit Report for 04/02/2018 Chief Complaint Document Details Patient Name: Virginia [name], Virginia A. Date of Service: 04/02/2018 2:45 PM Medical Record Number: [id number] Patient Account Number: [id number] Date of Birth/Sex: [birth year] (82 y.o. F) Treating RN: Montey Hora Primary Care Provider: Sherrie Mustache Other Clinician: Referring Provider: Sherrie Mustache Treating Provider/Extender: Melburn Hake, Candance Bohlman Weeks in Treatment: 3 Information Obtained from: Patient Chief Complaint 03/10/18; patient is here for review of a wound on her right posterior calf Electronic Signature(s) Signed: 04/06/2018 5:56:33 PM By: Worthy Keeler PA-C Entered By: Worthy Keeler on 04/02/2018 14:51:33 Virginia [name] ([id number]) -------------------------------------------------------------------------------- HPI Details Patient Name: Virginia Harold A. Date of Service: 04/02/2018 2:45 PM Medical Record Number: [id number] Patient Account Number: [id number] Date of Birth/Sex: February 09, 1936 (82 y.o. F) Treating RN: Montey Hora Primary Care Provider: Sherrie Mustache Other Clinician: Referring Provider: Sherrie Mustache Treating Provider/Extender: Melburn Hake, Makye Radle Weeks in Treatment: 3 History of Present Illness HPI Description: 03/10/18; is an 82 year old woman who is followed at [facility]. She is here for an open wound on the right posterior leg. Apparently this started in February with a fall. She was seen in the ER on 01/16/18 and the area was sutured. At some point the sutures were removed she was seen in her primary office on 02/09/18 at which time the wound had opened. It was felt to have cellulitis with redness drainage and tenderness and she was given doxycycline and Santyl was prescribed. they have been using Santyl and changing it daily. The patient has some form of dementia and I think is cared for by her brother. It is her daughter who brought her today. They tell us that she is  sometimes reluctant to get out of bed or reluctant to go to appointments. patient is not a diabetic. ABIs in this clinic were 0.95 on the right The patient has dementia, congestive heart failure, coronary artery disease, hypertension and COPD. She apparently falls fairly frequently. The reason for this is not completely clear 03/17/18; small but punched out wound on the right posterior calf 03/31/18; small but punched out wound on the right posterior calf. We've been using Santyl. This is initially felt to be traumatic on a bed frame although nobody is completely certain about that they simply noticed blood on the bed. 04/02/18 on evaluation today patient appears to be doing a little bit worse when she was seen two days ago. Fortunately she does not seem to have any evidence of severe infection although she does have some erythema surrounding the wound bed. Unfortunately she's also had increased discomfort and pain secondary to what her brother and sister feel is likely an infection. They state that she normally has a fairly high pain tolerance and has been really complaining of pain today. Patient does have dementia. Electronic Signature(s) Signed: 04/06/2018 5:56:33 PM By: Worthy Keeler PA-C Entered By: Worthy Keeler on 04/03/2018 19:30:26 Virginia [name] ([id number]) -------------------------------------------------------------------------------- Physical Exam Details Patient Name: Virginia Harold A. Date of Service: 04/02/2018 2:45 PM Medical Record Number: [id number] Patient Account Number: [id number] Date of Birth/Sex: September 21, 1936 (82 y.o. F) Treating RN: Montey Hora Primary Care Provider: Sherrie Mustache Other Clinician: Referring Provider: Sherrie Mustache Treating Provider/Extender: Melburn Hake, Sidhant Helderman Weeks in Treatment: 3 Constitutional Well-nourished and well-hydrated in no acute distress. Respiratory normal breathing without difficulty. clear to auscultation  bilaterally. Cardiovascular regular rate and rhythm with normal S1, S2. Psychiatric Patient is not able to cooperate in decision making regarding care. Patient has dementia.  patient is confused. Notes Patient's wound bed actually appears to be fairly good as far as the overall appearance although there was some purulent drainage noted in the base of the wound I did perform a wound culture of this drainage. No sharp debridement was performed today. Electronic Signature(s) Signed: 04/06/2018 5:56:33 PM By: Worthy Keeler PA-C Entered By: Worthy Keeler on 04/03/2018 19:31:50 Virginia [name] ([id number]) -------------------------------------------------------------------------------- Physician Orders Details Patient Name: Virginia Harold A. Date of Service: 04/02/2018 2:45 PM Medical Record Number: [id number] Patient Account Number: [id number] Date of Birth/Sex: 1936/11/07 (82 y.o. F) Treating RN: Montey Hora Primary Care Provider: Sherrie Mustache Other Clinician: Referring Provider: Sherrie Mustache Treating Provider/Extender: Melburn Hake, Atiya Yera Weeks in Treatment: 3 Verbal / Phone Orders: No Diagnosis Coding ICD-10 Coding Code Description [phone] 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scle S81.811D Laceration without foreign body, right lower leg, subsequent encounter Wound Cleansing Wound #1 Right,Posterior Lower Leg o Clean wound with Normal Saline. Anesthetic (add to Medication List) Wound #1 Right,Posterior Lower Leg o Other: - PainEase Spray Primary Wound Dressing Wound #1 Right,Posterior Lower Leg o Santyl Ointment o Plain packing gauze Secondary Dressing Wound #1 Right,Posterior Lower Leg o Dry Gauze o Boardered Foam Dressing Dressing Change Frequency Wound #1 Right,Posterior Lower Leg o Change dressing every day. Follow-up Appointments Wound #1 Right,Posterior Lower Leg o Return Appointment in 1 week. Medications-please add to  medication list. Wound #1 Right,Posterior Lower Leg o P.O. Antibiotics o Santyl Enzymatic Ointment Laboratory o Bacteria identified in Wound by Culture (MICRO) oooo LOINC Code: 8676-1 LEITA, LINDBLOOM ([id number]) oooo Convenience Name: Wound culture routine Patient Medications Allergies: lidocaine, "All the Caines" Notifications Medication Indication Start End Bactrim DS 04/02/2018 DOSE 1 - oral 800 mg-160 mg tablet - 1 tablet oral taken 2 times a day for 10 days Electronic Signature(s) Signed: 04/06/2018 5:56:33 PM By: Worthy Keeler PA-C Entered By: Worthy Keeler on 04/02/2018 15:49:27 Virginia [name] ([id number]) -------------------------------------------------------------------------------- Prescription 04/02/2018 Patient Name: Virginia [name] Provider: Worthy Keeler PA-C Date of Birth: Jul 04, 1936 NPI#: [id number] Sex: F DEA#: [id number] Phone #: [phone] License #: Patient Address: [facility] [place], [place] [address] [address], [place], [place] [address] [phone] Allergies lidocaine "All the Caines" Medication Medication: Route: Strength: Form: Bactrim DS oral 800 mg-160 mg tablet Class: ABSORBABLE SULFONAMIDE ANTIBACTERIAL AGENTS Dose: Frequency / Time: Indication: 1 1 tablet oral taken 2 times a day for 10 days Number of Refills: Number of Units: 0 Twenty (20) Tablet(s) Generic Substitution: Start Date: End Date: Administered at Substitution Permitted 12/02/9415 Facility: No Note to Pharmacy: Signature(s): Date(s): Electronic Signature(s) Signed: 04/06/2018 5:56:33 PM By: Worthy Keeler PA-C Entered By: Worthy Keeler on 04/02/2018 15:49:28 Virginia [name] ([id number]) Virginia [name] ([id number]) --------------------------------------------------------------------------------  Problem List Details Patient Name: Virginia Harold A. Date of  Service: 04/02/2018 2:45 PM Medical Record Number: [id number] Patient Account Number: [id number] Date of Birth/Sex: September 30, 1936 (81 y.o. 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 Date of Service: 04/02/2018 2:45 PM Medical Record Number: [id number] Patient Account Number: [id number] Date of Birth/Sex: July 29, 1936 (82 y.o. F) Treating RN: Montey Hora Primary Care Provider: Sherrie Mustache Other Clinician: Referring Provider: Sherrie Mustache Treating Provider/Extender: Melburn Hake, Roch Quach Weeks in Treatment: 3 Subjective Chief Complaint Information obtained from Patient 03/10/18; patient is here for review of a wound on her right posterior calf History of Present Illness (HPI) 03/10/18; is an 82 year old woman who is followed at [facility]. She is here for an open wound on the right posterior leg. Apparently this started in February with a fall. She was seen in the ER on 01/16/18 and the area was sutured. At some point the sutures were removed she was seen in her primary office on 02/09/18 at which time the wound had opened. It was felt to have cellulitis with redness drainage and tenderness and she was given doxycycline and Santyl was  prescribed. they have been using Santyl and changing it daily. The patient has some form of dementia and I think is cared for by her brother. It is her daughter who brought her today. They tell us that she is sometimes reluctant to get out of bed or reluctant to go to appointments. patient is not a diabetic. ABIs in this clinic were 0.95 on the right The patient has dementia, congestive heart failure, coronary artery disease, hypertension and COPD. She apparently falls fairly frequently. The reason for this is not completely clear 03/17/18; small but punched out wound on the right posterior calf 03/31/18; small but punched out wound on the right posterior calf. We've been using Santyl. This is initially felt to be traumatic on a bed frame although nobody is completely certain about that they simply noticed blood on the bed. 04/02/18 on evaluation today patient appears to be doing a little bit worse when she was seen two days ago. Fortunately she does not seem to have any evidence of severe infection although she does have some erythema surrounding the wound bed. Unfortunately she's also had increased discomfort and pain secondary to what her brother and sister feel is likely an infection. They state that she normally has a fairly high pain tolerance and has been really complaining of pain today. Patient does have dementia. Patient History Information obtained from Patient. Family History Cancer - Mother, Diabetes - Father,Mother, Heart Disease - Father, Hypertension - Father, No family history of Hereditary Spherocytosis, Kidney Disease, Lung Disease, Seizures, Stroke, Thyroid Problems, Tuberculosis. Social History Former smoker - 10 years, Marital Status - Single, Alcohol Use - Rarely, Drug Use - No History, Caffeine Use - Daily. Medical And Surgical History Notes Neurologic memory disorder Review of Systems (ROS) Constitutional Symptoms (General Health) Denies complaints or symptoms of Fever,  Chills. Respiratory The patient has no complaints or symptoms. JOSALYN, DETTMANN ([id number]) Cardiovascular The patient has no complaints or symptoms. Psychiatric The patient has no complaints or symptoms. Objective Constitutional Well-nourished and well-hydrated in no acute distress. Vitals Time Taken: 3:09 PM, Height: 63 in, Weight: 196 lbs, BMI: 34.7, Temperature: 98.5 F, Pulse: 63 bpm, Respiratory Rate: 16 breaths/min, Blood Pressure: 108/51 mmHg. Respiratory normal breathing without difficulty. clear to auscultation bilaterally. Cardiovascular regular rate and rhythm with normal S1, S2. Psychiatric Patient is not able to cooperate in decision making regarding care. Patient has dementia. patient is confused. General Notes: Patient's wound bed actually appears to be fairly good  as far as the overall appearance although there was some purulent drainage noted in the base of the wound I did perform a wound culture of this drainage. No sharp debridement was performed today. Integumentary (Hair, Skin) Wound #1 status is Open. Original cause of wound was Trauma. The wound is located on the Right,Posterior Lower Leg. The wound measures 0.5cm length x 1cm width x 0.6cm depth; 0.393cm^2 area and 0.236cm^3 volume. There is Fat Layer (Subcutaneous Tissue) Exposed exposed. There is no tunneling or undermining noted. There is a large amount of serosanguineous drainage noted. The wound margin is flat and intact. There is no granulation within the wound bed. There is a large (67-100%) amount of necrotic tissue within the wound bed including Adherent Slough. The periwound skin appearance exhibited: Maceration, Ecchymosis. The periwound skin appearance did not exhibit: Callus, Crepitus, Excoriation, Induration, Rash, Scarring, Dry/Scaly, Atrophie Blanche, Cyanosis, Hemosiderin Staining, Mottled, Pallor, Rubor, Erythema. Periwound temperature was noted as No Abnormality. The periwound has tenderness  on palpation. Assessment Active Problems ICD-10 L97.213 - 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scle S81.811D - Laceration without foreign body, right lower leg, subsequent encounter IREANNA, FINLAYSON A. ([id number]) Plan Wound Cleansing: Wound #1 Right,Posterior Lower Leg: Clean wound with Normal Saline. Anesthetic (add to Medication List): Wound #1 Right,Posterior Lower Leg: Other: - PainEase Spray Primary Wound Dressing: Wound #1 Right,Posterior Lower Leg: Santyl Ointment Plain packing gauze Secondary Dressing: Wound #1 Right,Posterior Lower Leg: Dry Gauze Boardered Foam Dressing Dressing Change Frequency: Wound #1 Right,Posterior Lower Leg: Change dressing every day. Follow-up Appointments: Wound #1 Right,Posterior Lower Leg: Return Appointment in 1 week. Medications-please add to medication list.: Wound #1 Right,Posterior Lower Leg: P.O. Antibiotics Santyl Enzymatic Ointment Laboratory ordered were: Wound culture routine The following medication(s) was prescribed: Bactrim DS oral 800 mg-160 mg tablet 1 1 tablet oral taken 2 times a day for 10 days starting 04/02/2018 Currently other than starting the patient on an antibiotic I do not think I will switch anything as far as the dressing is concerned at this point. I am going to go ahead and send in a prescription for the Septra DS and due to the fact that the patient's pharmacy is a recently converted [facility] from [facility] they would not receive the electronic prescription so this was actually written out. We will see were things stand in five days time at follow-up at which point we should hopefully have the results of the culture as well and can make any adjustments as necessary at that point. Please see above for specific wound care orders. We will see patient for re-evaluation in 1 week(s) here in the clinic. If anything worsens or changes patient will contact our office for additional  recommendations. Electronic Signature(s) Signed: 04/06/2018 5:56:33 PM By: Worthy Keeler PA-C Entered By: Worthy Keeler on 04/03/2018 19:32:06 Virginia [name] ([id number]) -------------------------------------------------------------------------------- ROS/PFSH Details Patient Name: Virginia Harold A. Date of Service: 04/02/2018 2:45 PM Medical Record Number: [id number] Patient Account Number: [id number] Date of Birth/Sex: 1936-05-15 (82 y.o. F) Treating RN: Montey Hora Primary Care Provider: Sherrie Mustache Other Clinician: Referring Provider: Sherrie Mustache Treating Provider/Extender: Melburn Hake, Annastasia Haskins Weeks in Treatment: 3 Information Obtained From Patient Wound History Do you currently have one or more open woundso Yes How many open wounds do you currently haveo 1 Approximately how long have you had your woundso 5 weeks How have you been treating your wound(s) until nowo santyl Has your wound(s) ever healed and then re-openedo No Have  you had any lab work done in the past montho No Have you tested positive for an antibiotic resistant organism (MRSA, VRE)o No Have you tested positive for osteomyelitis (bone infection)o No Have you had any tests for circulation on your legso No Have you had other problems associated with your woundso Infection Constitutional Symptoms (General Health) Complaints and Symptoms: Negative for: Fever; Chills Eyes Medical History: Positive for: Cataracts - removed Negative for: Glaucoma; Optic Neuritis Hematologic/Lymphatic Medical History: Negative for: Anemia; Hemophilia; Human Immunodeficiency Virus; Lymphedema; Sickle Cell Disease Respiratory Complaints and Symptoms: No Complaints or Symptoms Medical History: Positive for: Chronic Obstructive Pulmonary Disease (COPD) Negative for: Aspiration; Asthma; Pneumothorax; Sleep Apnea; Tuberculosis Cardiovascular Complaints and Symptoms: No Complaints or Symptoms Medical History: Positive for:  Congestive Heart Failure; Coronary Artery Disease; Hypertension Negative for: Angina; Arrhythmia; Deep Vein Thrombosis; Hypotension; Myocardial Infarction; Peripheral Arterial Disease; Peripheral Venous Disease; Phlebitis; Vasculitis MARIAPAULA, KRIST A. ([id number]) Gastrointestinal Medical History: Negative for: Cirrhosis ; Colitis; Crohnos; Hepatitis A; Hepatitis B; Hepatitis C Endocrine Medical History: Negative for: Type II Diabetes Genitourinary Medical History: Negative for: End Stage Renal Disease Immunological Medical History: Negative for: Lupus Erythematosus; Raynaudos; Scleroderma Musculoskeletal Medical History: Positive for: Osteoarthritis Negative for: Gout; Rheumatoid Arthritis; Osteomyelitis Neurologic Medical History: Negative for: Dementia; Neuropathy Past Medical History Notes: memory disorder Oncologic Medical History: Negative for: Received Chemotherapy; Received Radiation Psychiatric Complaints and Symptoms: No Complaints or Symptoms HBO Extended History Items Eyes: Cataracts Immunizations Pneumococcal Vaccine: Received Pneumococcal Vaccination: Yes Implantable Devices Family and Social History Cancer: Yes - Mother; Diabetes: Yes - Father,Mother; Heart Disease: Yes - Father; Hereditary Spherocytosis: No; Hypertension: Yes - Father; Kidney Disease: No; Lung Disease: No; Seizures: No; Stroke: No; Thyroid Problems: No; Tuberculosis: No; Former smoker - 10 years; Marital Status - Single; Alcohol Use: Rarely; Drug Use: No History; Caffeine Use: Daily; Financial Concerns: No; Food, Clothing or Shelter Needs: No; Support System Lacking: No; Transportation Concerns: No; Advanced Directives: No; Patient does not want information on Advanced Directives RAEYA, MERRITTS ([id number]) Physician Affirmation I have reviewed and agree with the above information. Electronic Signature(s) Signed: 04/06/2018 2:45:02 PM By: Montey Hora Signed: 04/06/2018 5:56:33 PM By: Worthy Keeler PA-C Entered By: Worthy Keeler on 04/03/2018 19:30:54 Virginia [name] ([id number]) -------------------------------------------------------------------------------- SuperBill Details Patient Name: Virginia Harold A. Date of Service: 04/02/2018 Medical Record Number: [id number] Patient Account Number: [id number] Date of Birth/Sex: 1936/04/20 (82 y.o. F) Treating RN: Montey Hora Primary Care Provider: Sherrie Mustache Other Clinician: Referring Provider: Sherrie Mustache Treating Provider/Extender: Melburn Hake, Talaysia Pinheiro Weeks in Treatment: 3 Diagnosis Coding ICD-10 Codes Code Description [phone] 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scle S81.811D Laceration without foreign body, right lower leg, subsequent encounter Facility Procedures CPT4 Code: [id number] Description: [facility] [place] VISIT-LEV 3 EST PT Modifier: Quantity: 1 Physician Procedures CPT4 Code: [id number] Description: 29798 - WC PHYS LEVEL 3 - EST PT ICD-10 Diagnosis Description X21.194 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

## 2018-04-09 ENCOUNTER — Ambulatory Visit (HOSPITAL_COMMUNITY)
Admission: RE | Admit: 2018-04-09 | Discharge: 2018-04-09 | Disposition: A | Discharge status: Home / Self Care | Payer: Medicare Other | Source: Ambulatory Visit | Attending: Cardiology | Admitting: Cardiology

## 2018-04-09 DIAGNOSIS — X58XXXA Exposure to other specified factors, initial encounter: Secondary | ICD-10-CM | POA: Insufficient documentation

## 2018-04-09 DIAGNOSIS — S81801A Unspecified open wound, right lower leg, initial encounter: Secondary | ICD-10-CM | POA: Diagnosis not present

## 2018-04-10 NOTE — Progress Notes (Signed)
VALLEY, KE ([id number]) Visit Report for 04/07/2018 Debridement Details Patient Name: Virginia [name], Virginia A. Date of Service: 04/07/2018 2:15 PM Medical Record Number: [id number] Patient Account Number: [id number] Date of Birth/Sex: [birth year] (82 y.o. F) Treating RN: Cornell Barman Primary Care Provider: Sherrie Mustache Other Clinician: Referring Provider: Sherrie Mustache Treating Provider/Extender: Tito Dine in Treatment: 4 Debridement Performed for Wound #1 Right,Posterior Lower Leg Assessment: Performed By: Physician Ricard Dillon, MD Debridement Type: Debridement Severity of Tissue Pre Fat layer exposed Debridement: Pre-procedure Verification/Time Yes - 14:58 Out Taken: Start Time: 14:58 Pain Control: Other : lidocaine 4 Total Area Debrided (L x W): 0.7 (cm) x 1.3 (cm) = 0.91 (cm) Tissue and other material Viable, Non-Viable, Slough, Subcutaneous, Slough debrided: Level: Skin/Subcutaneous Tissue Debridement Description: Excisional Instrument: Curette Bleeding: Minimum Hemostasis Achieved: Pressure End Time: 15:00 Procedural Pain: 2 Post Procedural Pain: 0 Response to Treatment: Procedure was tolerated well Level of Consciousness: Awake and Alert Post Procedure Vitals: Temperature: 98.2 Pulse: 72 Respiratory Rate: 16 Blood Pressure: Systolic Blood Pressure: 952 Diastolic Blood Pressure: 53 Post Debridement Measurements of Total Wound Length: (cm) 0.7 Width: (cm) 1.3 Depth: (cm) 0.4 Volume: (cm) 0.286 Character of Wound/Ulcer Post Debridement: Stable Severity of Tissue Post Debridement: Fat layer exposed Post Procedure Diagnosis Same as Pre-procedure Virginia [name], Virginia [name] ([id number]) Electronic Signature(s) Signed: 04/07/2018 5:47:31 PM By: Gretta Cool, BSN, RN, CWS, Kim RN, BSN Signed: 04/08/2018 1:16:01 PM By: Linton Ham MD Entered By: Linton Ham on 04/07/2018 16:40:00 Virginia [name]  ([id number]) -------------------------------------------------------------------------------- HPI Details Patient Name: Virginia Harold A. Date of Service: 04/07/2018 2:15 PM Medical Record Number: [id number] Patient Account Number: [id number] Date of Birth/Sex: 31-Aug-1936 (82 y.o. F) Treating RN: Cornell Barman Primary Care Provider: Sherrie Mustache Other Clinician: Referring Provider: Sherrie Mustache Treating Provider/Extender: Tito Dine in Treatment: 4 History of Present Illness HPI Description: 03/10/18; is an 82 year old woman who is followed at [facility]. She is here for an open wound on the right posterior leg. Apparently this started in February with a fall. She was seen in the ER on 01/16/18 and the area was sutured. At some point the sutures were removed she was seen in her primary office on 02/09/18 at which time the wound had opened. It was felt to have cellulitis with redness drainage and tenderness and she was given doxycycline and Santyl was prescribed. they have been using Santyl and changing it daily. The patient has some form of dementia and I think is cared for by her brother. It is her daughter who brought her today. They tell us that she is sometimes reluctant to get out of bed or reluctant to go to appointments. patient is not a diabetic. ABIs in this clinic were 0.95 on the right The patient has dementia, congestive heart failure, coronary artery disease, hypertension and COPD. She apparently falls fairly frequently. The reason for this is not completely clear 03/17/18; small but punched out wound on the right posterior calf 03/31/18; small but punched out wound on the right posterior calf. We've been using Santyl. This is initially felt to be traumatic on a bed frame although nobody is completely certain about that they simply noticed blood on the bed. 04/02/18 on evaluation today patient appears to be doing a little bit worse when she was seen two days ago.  Fortunately she does not seem to have any evidence of severe infection although she does have some erythema surrounding the wound bed. Unfortunately she's also had increased discomfort and pain  secondary to what her brother and sister feel is likely an infection. They state that she normally has a fairly high pain tolerance and has been really complaining of pain today. Patient does have dementia. 04/07/18; patient was in with erythema around the wound on 04/02/18. Culture that was done last time showed methicillin sensitive staph aureus which should've been covered by the trimethoprim/sulfamethoxazole we gave her. Electronic Signature(s) Signed: 04/08/2018 1:16:01 PM By: Linton Ham MD Entered By: Linton Ham on 04/07/2018 16:40:57 Virginia [name] ([id number]) -------------------------------------------------------------------------------- Physical Exam Details Patient Name: Virginia Harold A. Date of Service: 04/07/2018 2:15 PM Medical Record Number: [id number] Patient Account Number: [id number] Date of Birth/Sex: 04-11-1936 (82 y.o. F) Treating RN: Cornell Barman Primary Care Provider: Sherrie Mustache Other Clinician: Referring Provider: Sherrie Mustache Treating Provider/Extender: Tito Dine in Treatment: 4 Constitutional Sitting or standing Blood Pressure is within target range for patient.. Pulse regular and within target range for patient.Marland Kitchen Respirations regular, non-labored and within target range.. Temperature is normal and within the target range for the patient.Marland Kitchen appears in no distress. Notes wound exam; the patient's wounds still had some adherent nonviable subcutaneous tissue which I removed with a #3 curette. Post debridement the wound bed looks stable although there is still some depth of this. There is no surrounding erythema and from this angle things look better on Septra Electronic Signature(s) Signed: 04/08/2018 1:16:01 PM By: Linton Ham MD Entered By:  Linton Ham on 04/07/2018 16:42:22 Virginia [name] ([id number]) -------------------------------------------------------------------------------- Physician Orders Details Patient Name: Virginia Harold A. Date of Service: 04/07/2018 2:15 PM Medical Record Number: [id number] Patient Account Number: [id number] Date of Birth/Sex: 09-02-1936 (82 y.o. F) Treating RN: Cornell Barman Primary Care Provider: Sherrie Mustache Other Clinician: Referring Provider: Sherrie Mustache Treating Provider/Extender: Tito Dine in Treatment: 4 Verbal / Phone Orders: No Diagnosis Coding Wound Cleansing Wound #1 Right,Posterior Lower Leg o Clean wound with Normal Saline. Anesthetic (add to Medication List) Wound #1 Right,Posterior Lower Leg o Other: - PainEase Spray Primary Wound Dressing Wound #1 Right,Posterior Lower Leg o Santyl Ointment o Plain packing gauze Secondary Dressing Wound #1 Right,Posterior Lower Leg o Dry Gauze o Boardered Foam Dressing Dressing Change Frequency Wound #1 Right,Posterior Lower Leg o Change dressing every day. Follow-up Appointments Wound #1 Right,Posterior Lower Leg o Return Appointment in 1 week. Medications-please add to medication list. Wound #1 Right,Posterior Lower Leg o P.O. Antibiotics o Santyl Enzymatic Ointment Electronic Signature(s) Signed: 04/07/2018 5:47:31 PM By: Gretta Cool, BSN, RN, CWS, Kim RN, BSN Signed: 04/08/2018 1:16:01 PM By: Linton Ham MD Entered By: Gretta Cool, BSN, RN, CWS, Kim on 04/07/2018 15:05:01 Virginia [name] ([id number]) -------------------------------------------------------------------------------- Problem List Details Patient Name: Virginia Harold A. Date of Service: 04/07/2018 2:15 PM Medical Record Number: [id number] Patient Account Number: [id number] Date of Birth/Sex: 11-02-1936 (82 y.o. 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 Date of Service: 04/07/2018 2:15 PM Medical Record Number: [id number] Patient Account Number: [id number] Date of Birth/Sex: 09-17-1936 (82 y.o. F) Treating  RN: Cornell Barman Primary Care Provider: Sherrie Mustache Other Clinician: Referring Provider: Sherrie Mustache Treating Provider/Extender: Tito Dine in Treatment: 4 Subjective History of Present Illness (HPI) 03/10/18; is an 82 year old woman who is followed at [facility]. She is here for an open wound on the right posterior leg. Apparently this started in February with a fall. She was seen in the ER on 01/16/18 and the area was sutured. At some point the sutures were removed she was seen in her primary office on 02/09/18 at which time the wound had opened. It was felt to have cellulitis with redness drainage and tenderness and she was given doxycycline and Santyl was prescribed. they have been using Santyl and changing it daily. The patient has some form of dementia and I think is cared for by her brother. It is her daughter who brought her today. They tell us that she is sometimes reluctant to get out of bed or reluctant to go to appointments. patient is not a diabetic. ABIs in this clinic were 0.95 on the right The patient has dementia,  congestive heart failure, coronary artery disease, hypertension and COPD. She apparently falls fairly frequently. The reason for this is not completely clear 03/17/18; small but punched out wound on the right posterior calf 03/31/18; small but punched out wound on the right posterior calf. We've been using Santyl. This is initially felt to be traumatic on a bed frame although nobody is completely certain about that they simply noticed blood on the bed. 04/02/18 on evaluation today patient appears to be doing a little bit worse when she was seen two days ago. Fortunately she does not seem to have any evidence of severe infection although she does have some erythema surrounding the wound bed. Unfortunately she's also had increased discomfort and pain secondary to what her brother and sister feel is likely an infection. They state that she normally has a fairly high pain tolerance and has been really complaining of pain today. Patient does have dementia. 04/07/18; patient was in with erythema around the wound on 04/02/18. Culture that was done last time showed methicillin sensitive staph aureus which should've been covered by the trimethoprim/sulfamethoxazole we gave her. Objective Constitutional Sitting or standing Blood Pressure is within target range for patient.. Pulse regular and within target range for patient.Marland Kitchen Respirations regular, non-labored and within target range.. Temperature is normal and within the target range for the patient.Marland Kitchen appears in no distress. Vitals Time Taken: 2:23 PM, Height: 63 in, Weight: 196 lbs, BMI: 34.7, Temperature: 98.2 F, Pulse: 72 bpm, Respiratory Rate: 16 breaths/min, Blood Pressure: 113/53 mmHg. General Notes: wound exam; the patient's wounds still had some adherent nonviable subcutaneous tissue which I removed with a #3 curette. Post debridement the wound bed looks stable although there is still some depth of this. There is no surrounding erythema and from this  angle things look better on Virginia [name], Virginia A. ([id number]) Integumentary (Hair, Skin) Wound #1 status is Open. Original cause of wound was Trauma. The wound is located on the Right,Posterior Lower Leg. The wound measures 0.7cm length x 1.3cm width x 0.3cm depth; 0.715cm^2 area and 0.214cm^3 volume. There is Fat Layer (Subcutaneous Tissue) Exposed exposed. There is no tunneling or undermining noted. There is a small amount of serosanguineous drainage noted. The wound margin is flat and intact. There is no granulation within the wound bed. There is a large (67-100%) amount of necrotic tissue within the wound bed including Adherent Slough. The periwound skin  appearance exhibited: Maceration, Ecchymosis. The periwound skin appearance did not exhibit: Callus, Crepitus, Excoriation, Induration, Rash, Scarring, Dry/Scaly, Atrophie Blanche, Cyanosis, Hemosiderin Staining, Mottled, Pallor, Rubor, Erythema. Periwound temperature was noted as No Abnormality. The periwound has tenderness on palpation. Assessment Active Problems ICD-10 L97.213 - 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scle S81.811D - Laceration without foreign body, right lower leg, subsequent encounter Procedures Wound #1 Pre-procedure diagnosis of Wound #1 is a Venous Leg Ulcer located on the Right,Posterior Lower Leg .Severity of Tissue Pre Debridement is: Fat layer exposed. There was a Excisional Skin/Subcutaneous Tissue Debridement with a total area of 0.91 sq cm performed by Ricard Dillon, MD. With the following instrument(s): Curette. to remove Viable and Non-Viable tissue/material Material removed includes Subcutaneous Tissue, and [place] after achieving pain control using Other (lidocaine 4). No specimens were taken. A time out was conducted at 14:58, prior to the start of the procedure. A Minimum amount of bleeding was controlled with Pressure. The procedure was tolerated well with a pain  level of 2 throughout and a pain level of 0 following the procedure. Patient s Level of Consciousness post procedure was recorded as Awake and Alert and post-procedure vitals were taken including Temperature: 98.2 F, Pulse: 72 bpm, Respiratory Rate: 16 breaths/min, Blood Pressure: (113)/(53) mmHg. Post Debridement Measurements: 0.7cm length x 1.3cm width x 0.4cm depth; 0.286cm^3 volume. Character of Wound/Ulcer Post Debridement is stable. Severity of Tissue Post Debridement is: Fat layer exposed. Post procedure Diagnosis Wound #1: Same as Pre-Procedure Plan Wound Cleansing: Wound #1 Right,Posterior Lower Leg: Clean wound with Normal Saline. Anesthetic (add to Medication List): Wound #1 Right,Posterior Lower Leg: Other: - PainEase Spray Primary Wound Dressing: Wound #1 Right,Posterior Lower Leg: Santyl Ointment Virginia [name], Virginia A. ([id number]) Plain packing gauze Secondary Dressing: Wound #1 Right,Posterior Lower Leg: Dry Gauze Boardered Foam Dressing Dressing Change Frequency: Wound #1 Right,Posterior Lower Leg: Change dressing every day. Follow-up Appointments: Wound #1 Right,Posterior Lower Leg: Return Appointment in 1 week. Medications-please add to medication list.: Wound #1 Right,Posterior Lower Leg: P.O. Antibiotics Santyl Enzymatic Ointment #1 I'm going to continue the Santyl for another week hopefully be able to change to perhaps Endoform next week #2 the patient is to complete her Septra which seems to be covering the MSSA infection Electronic Signature(s) Signed: 04/08/2018 1:16:01 PM By: Linton Ham MD Entered By: Linton Ham on 04/07/2018 16:43:05 Virginia [name] ([id number]) -------------------------------------------------------------------------------- SuperBill Details Patient Name: Virginia Harold A. Date of Service: 04/07/2018 Medical Record Number: [id number] Patient Account Number: [id number] Date of Birth/Sex: 06-20-1936 (82 y.o. F) Treating RN: Cornell Barman Primary Care Provider: Sherrie Mustache Other Clinician: Referring Provider: Sherrie Mustache Treating Provider/Extender: Tito Dine in Treatment: 4 Diagnosis Coding ICD-10 Codes Code Description [phone] 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scle S81.811D Laceration without foreign body, right lower leg, subsequent encounter Facility Procedures CPT4 Code: [id number] Description: 67341 - DEB SUBQ TISSUE 20 SQ CM/< ICD-10 Diagnosis Description L97.213 Non-pressure chronic ulcer of right calf with necrosis of m Modifier: uscle Quantity: 1 Physician Procedures CPT4 Code: [id number] Description: 09735 - WC PHYS SUBQ TISS 20 SQ CM ICD-10 Diagnosis Description H29.924 Non-pressure chronic ulcer of right calf with necrosis of m Modifier: uscle Quantity: 1 Electronic Signature(s) Signed: 04/08/2018 1:16:01 PM By: Linton Ham MD Entered By: Linton Ham on 04/07/2018 16:43:22

## 2018-04-14 ENCOUNTER — Encounter: Payer: Medicare Other | Admitting: Internal Medicine

## 2018-04-14 ENCOUNTER — Ambulatory Visit: Payer: Medicare Other | Admitting: Nurse Practitioner

## 2018-04-14 ENCOUNTER — Ambulatory Visit: Payer: Medicare Other

## 2018-04-14 DIAGNOSIS — S81801A Unspecified open wound, right lower leg, initial encounter: Secondary | ICD-10-CM | POA: Diagnosis not present

## 2018-04-14 DIAGNOSIS — I2510 Atherosclerotic heart disease of native coronary artery without angina pectoris: Secondary | ICD-10-CM | POA: Diagnosis not present

## 2018-04-14 DIAGNOSIS — I110 Hypertensive heart disease with heart failure: Secondary | ICD-10-CM | POA: Diagnosis not present

## 2018-04-14 DIAGNOSIS — J449 Chronic obstructive pulmonary disease, unspecified: Secondary | ICD-10-CM | POA: Diagnosis not present

## 2018-04-14 DIAGNOSIS — L97312 Non-pressure chronic ulcer of right ankle with fat layer exposed: Secondary | ICD-10-CM | POA: Diagnosis not present

## 2018-04-14 DIAGNOSIS — I509 Heart failure, unspecified: Secondary | ICD-10-CM | POA: Diagnosis not present

## 2018-04-14 DIAGNOSIS — F0390 Unspecified dementia without behavioral disturbance: Secondary | ICD-10-CM | POA: Diagnosis not present

## 2018-04-15 ENCOUNTER — Ambulatory Visit: Payer: Medicare Other | Admitting: Nurse Practitioner

## 2018-04-15 ENCOUNTER — Ambulatory Visit: Payer: Medicare Other | Admitting: Allergy & Immunology

## 2018-04-16 NOTE — Progress Notes (Signed)
DOMINIKA, LOSEY ([id number]) Visit Report for 04/14/2018 HPI Details Patient Name: Virginia [name], Virginia A. Date of Service: 04/14/2018 2:45 PM Medical Record Number: [id number] Patient Account Number: [id number] Date of Birth/Sex: [birth year] (82 y.o. F) Treating RN: Cornell Barman Primary Care Provider: Sherrie Mustache Other Clinician: Referring Provider: Sherrie Mustache Treating Provider/Extender: Tito Dine in Treatment: 5 History of Present Illness HPI Description: 03/10/18; is an 82 year old woman who is followed at [facility]. She is here for an open wound on the right posterior leg. Apparently this started in February with a fall. She was seen in the ER on 01/16/18 and the area was sutured. At some point the sutures were removed she was seen in her primary office on 02/09/18 at which time the wound had opened. It was felt to have cellulitis with redness drainage and tenderness and she was given doxycycline and Santyl was prescribed. they have been using Santyl and changing it daily. The patient has some form of dementia and I think is cared for by her brother. It is her daughter who brought her today. They tell us that she is sometimes reluctant to get out of bed or reluctant to go to appointments. patient is not a diabetic. ABIs in this clinic were 0.95 on the right The patient has dementia, congestive heart failure, coronary artery disease, hypertension and COPD. She apparently falls fairly frequently. The reason for this is not completely clear 03/17/18; small but punched out wound on the right posterior calf 03/31/18; small but punched out wound on the right posterior calf. We've been using Santyl. This is initially felt to be traumatic on a bed frame although nobody is completely certain about that they simply noticed blood on the bed. 04/02/18 on evaluation today patient appears to be doing a little bit worse when she was seen two days ago. Fortunately she does not seem to have  any evidence of severe infection although she does have some erythema surrounding the wound bed. Unfortunately she's also had increased discomfort and pain secondary to what her brother and sister feel is likely an infection. They state that she normally has a fairly high pain tolerance and has been really complaining of pain today. Patient does have dementia. 04/07/18; patient was in with erythema around the wound on 04/02/18. Culture that was done last time showed methicillin sensitive staph aureus which should've been covered by the trimethoprim/sulfamethoxazole we gave her. 04/14/18; the area on the right posterior calf which was initially a traumatic wound that was sutured but then the test. She is completing her antibiotics today. We've been using Santyl. Dimensions of the wound are smaller there is still some probing depth although very small wound. Somewhat firm around the wound which I think is probably scar tissue. She went for arterial studies. She was not able to tolerate ABIs. On the right she did have biphasic wave forms for the most part there was a 50-74% stenosis in the above-knee popliteal and a 30-49% stenosis in the common femoral artery. She has 2 vessel runoff E of the PTA and ATA. Peroneal artery was not visualized. The patient is not very ambulatory unsteady on her feet but I'm really not certain any of this represents claudication. Electronic Signature(s) Signed: 04/14/2018 4:50:03 PM By: Linton Ham MD Entered By: Linton Ham on 04/14/2018 15:28:57 [name] ([id number]) -------------------------------------------------------------------------------- Physical Exam Details Patient Name: Virginia Harold A. Date of Service: 04/14/2018 2:45 PM Medical Record Number: [id number] Patient Account Number: [id number] Date of Birth/Sex:  1936/04/03 (82 y.o. F) Treating RN: Cornell Barman Primary Care Provider: Sherrie Mustache Other Clinician: Referring Provider: Sherrie Mustache Treating Provider/Extender: Ricard Dillon Weeks in Treatment: 5 Eyes Conjunctivae clear. No discharge. Respiratory Respiratory effort is easy and symmetric bilaterally. Rate is normal at rest and on room air.. Cardiovascular Pedal pulses absent. is no swelling in the calf. Lymphatic none palpable in the right popliteal or inguinal area. Integumentary (Hair, Skin) no rashes seen. Notes wound exam; the patient's wounds did not require debridement. This is small but still with some depth. Can amount of surface slough which was washed off. There is no surrounding erythema. Surrounding tissue however is tender and somewhat firm presumably scar tissue from suturing Electronic Signature(s) Signed: 04/14/2018 4:50:03 PM By: Linton Ham MD Entered By: Linton Ham on 04/14/2018 15:30:48 [name] ([id number]) -------------------------------------------------------------------------------- Physician Orders Details Patient Name: Virginia Harold A. Date of Service: 04/14/2018 2:45 PM Medical Record Number: [id number] Patient Account Number: [id number] Date of Birth/Sex: 02-10-1936 (82 y.o. F) Treating RN: Cornell Barman Primary Care Provider: Sherrie Mustache Other Clinician: Referring Provider: Sherrie Mustache Treating Provider/Extender: Tito Dine in Treatment: 5 Verbal / Phone Orders: No Diagnosis Coding Wound Cleansing Wound #1 Right,Posterior Lower Leg o Clean wound with Normal Saline. Anesthetic (add to Medication List) Wound #1 Right,Posterior Lower Leg o Other: - PainEase Spray Primary Wound Dressing Wound #1 Right,Posterior Lower Leg o Santyl Ointment o Plain packing gauze Secondary Dressing Wound #1 Right,Posterior Lower Leg o Dry Gauze o Boardered Foam Dressing Dressing Change Frequency Wound #1 Right,Posterior Lower Leg o Change dressing every day. Follow-up Appointments Wound #1 Right,Posterior Lower Leg o Return  Appointment in 1 week. Medications-please add to medication list. Wound #1 Right,Posterior Lower Leg o P.O. Antibiotics - complete o Santyl Enzymatic Ointment Electronic Signature(s) Signed: 04/14/2018 4:50:03 PM By: Linton Ham MD Signed: 04/14/2018 4:52:44 PM By: Gretta Cool, BSN, RN, CWS, Kim RN, BSN Entered By: Gretta Cool, BSN, RN, CWS, Kim on 04/14/2018 15:08:41 [name] ([id number]) -------------------------------------------------------------------------------- Problem List Details Patient Name: Virginia Harold A. Date of Service: 04/14/2018 2:45 PM Medical Record Number: [id number] Patient Account Number: [id number] Date of Birth/Sex: [birth year] (82 y.o. 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 Date of Service: 04/14/2018 2:45 PM Medical Record Number: [id number] Patient Account Number: [id number] Date of Birth/Sex: 05/12/1936 (82 y.o. F) Treating RN: Cornell Barman Primary Care Provider: Sherrie Mustache Other Clinician: Referring Provider: Sherrie Mustache Treating Provider/Extender: Tito Dine in Treatment: 5 Subjective History of Present Illness (HPI) 03/10/18; is an 82 year old woman who is followed at [facility]. She is here for an open wound on the right posterior leg. Apparently this started in February with a fall. She was seen in the ER on 01/16/18 and the area was sutured. At some point the sutures were removed she was seen in her primary office on 02/09/18 at which time the wound had opened. It was felt to have cellulitis with redness drainage and tenderness and  she was given doxycycline and Santyl was prescribed. they have been using Santyl and changing it daily. The patient has some form of dementia and I think is cared for by her brother. It is her daughter who brought her today. They tell us that she is sometimes reluctant to get out of bed or reluctant to go to appointments. patient is not a diabetic. ABIs in this clinic were 0.95 on the right The patient has dementia, congestive heart failure, coronary artery disease, hypertension and COPD. She apparently falls fairly frequently. The reason for this is not completely clear 03/17/18; small but punched out wound on the right posterior calf 03/31/18; small but punched out wound on the right posterior calf. We've been using Santyl. This is initially felt to be traumatic on a bed frame although nobody is completely certain about that they simply noticed blood on the bed. 04/02/18 on evaluation today patient appears to be doing a little bit worse when she was seen two days ago. Fortunately she does not seem to have any evidence of severe infection although she does have some erythema surrounding the wound bed. Unfortunately she's also had increased discomfort and pain secondary to what her brother and sister feel is likely an infection. They state that she normally has a fairly high pain tolerance and has been really complaining of pain today. Patient does have dementia. 04/07/18; patient was in with erythema around the wound on 04/02/18. Culture that was done last time showed methicillin sensitive staph aureus which should've been covered by the  trimethoprim/sulfamethoxazole we gave her. 04/14/18; the area on the right posterior calf which was initially a traumatic wound that was sutured but then the test. She is completing her antibiotics today. We've been using Santyl. Dimensions of the wound are smaller there is still some probing depth although very small wound. Somewhat firm around the wound which I think is probably scar tissue. She went for arterial studies. She was not able to tolerate ABIs. On the right she did have biphasic wave forms for the most part there was a 50-74% stenosis in the above-knee popliteal and a 30-49% stenosis in the common femoral artery. She has 2 vessel runoff E of the PTA and ATA. Peroneal artery was not visualized. The patient is not very ambulatory unsteady on her feet but I'm really not certain any of this represents claudication. Objective Constitutional Vitals Time Taken: 2:53 PM, Height: 63 in, Weight: 196 lbs, BMI: 34.7, Temperature: 98.1 F, Pulse: 84 bpm, Respiratory Rate: 16 breaths/min, Blood Pressure: 112/54 mmHg. TREONNA, KLEE A. ([id number]) Eyes Conjunctivae clear. No discharge. Respiratory Respiratory effort is easy and symmetric bilaterally. Rate is normal at rest and on room air.. Cardiovascular Pedal pulses absent. is no swelling in the calf. Lymphatic none palpable in the right popliteal or inguinal area. General Notes: wound exam; the patient's wounds did not require debridement. This is small but still with some depth. Can amount of surface slough which was washed off. There is no surrounding erythema. Surrounding tissue however is tender and somewhat firm presumably scar tissue from suturing Integumentary (Hair, Skin) no rashes seen. Wound #1 status is Open. Original cause of wound was Trauma. The wound is located on the Right,Posterior Lower Leg. The wound measures 0.3cm length x 0.3cm width x 0.3cm depth; 0.071cm^2 area and 0.021cm^3 volume. There is Fat Layer (Subcutaneous  Tissue) Exposed exposed. There is no tunneling or undermining noted. There is a medium amount of serosanguineous drainage noted. The wound margin is flat  and intact. There is medium (34-66%) pink granulation within the wound bed. There is a medium (34-66%) amount of necrotic tissue within the wound bed including Adherent Slough. The periwound skin appearance exhibited: Maceration, Ecchymosis. The periwound skin appearance did not exhibit: Callus, Crepitus, Excoriation, Induration, Rash, Scarring, Dry/Scaly, Atrophie Blanche, Cyanosis, Hemosiderin Staining, Mottled, Pallor, Rubor, Erythema. Periwound temperature was noted as No Abnormality. The periwound has tenderness on palpation. Assessment Active Problems ICD-10 L97.213 - 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scle S81.811D - Laceration without foreign body, right lower leg, subsequent encounter Plan Wound Cleansing: Wound #1 Right,Posterior Lower Leg: Clean wound with Normal Saline. Anesthetic (add to Medication List): Wound #1 Right,Posterior Lower Leg: Other: - PainEase Spray Primary Wound Dressing: Wound #1 Right,Posterior Lower Leg: Santyl Ointment Plain packing gauze Secondary Dressing: NASHAY, BRICKLEY A. ([id number]) Wound #1 Right,Posterior Lower Leg: Dry Gauze Boardered Foam Dressing Dressing Change Frequency: Wound #1 Right,Posterior Lower Leg: Change dressing every day. Follow-up Appointments: Wound #1 Right,Posterior Lower Leg: Return Appointment in 1 week. Medications-please add to medication list.: Wound #1 Right,Posterior Lower Leg: P.O. Antibiotics - complete Santyl Enzymatic Ointment #1 electedcontinue with Santyl and iodoform packing to this small area. It appears to be contracting towards closure. If this is not any better next week consider Endoform or Iodosorb ointment #2 the patient has documented PAD especially in the proximal popliteal artery above the knee however it is determined  a good course of action here. Her wound appears to be contracting, she is not overtly symptomatic. For now I will just leave this alone. She is already on aspirin Electronic Signature(s) Signed: 04/14/2018 4:50:03 PM By: Linton Ham MD Entered By: Linton Ham on 04/14/2018 15:34:18 [name] ([id number]) -------------------------------------------------------------------------------- SuperBill Details Patient Name: Virginia Harold A. Date of Service: 04/14/2018 Medical Record Number: [id number] Patient Account Number: [id number] Date of Birth/Sex: Sep 12, 1936 (82 y.o. F) Treating RN: Cornell Barman Primary Care Provider: Sherrie Mustache Other Clinician: Referring Provider: Sherrie Mustache Treating Provider/Extender: Tito Dine in Treatment: 5 Diagnosis Coding ICD-10 Codes Code Description [phone] 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scle S81.811D Laceration without foreign body, right lower leg, subsequent encounter Facility Procedures CPT4 Code: [id number] Description: [phone] - WOUND CARE VISIT-LEV 2 EST PT Modifier: Quantity: 1 Physician Procedures CPT4 Code: [id number] Description: 93734 - WC PHYS LEVEL 3 - EST PT ICD-10 Diagnosis Description L97.213 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

## 2018-04-16 NOTE — Progress Notes (Signed)
LIAHNA, BRICKNER ([id number]) Visit Report for 04/14/2018 Arrival Information Details Patient Name: Virginia [name], Virginia [name]. Date of Service: 04/14/2018 2:45 PM Medical Record Number: [id number] Patient Account Number: [id number] Date of Birth/Sex: 01-23-1936 (82 y.o. F) Treating RN: Ahmed Prima Primary Care Frankie Scipio: Sherrie Mustache Other Clinician: Referring Yarely Bebee: Sherrie Mustache Treating Shonta Bourque/Extender: Tito Dine in Treatment: 5 Visit Information History Since Last Visit All ordered tests and consults were completed: No Patient Arrived: Wheel Chair Added or deleted any medications: No Arrival Time: 14:52 Any new allergies or adverse reactions: No Accompanied By: family Had a fall or experienced change in No Transfer Assistance: EasyPivot Patient activities of daily living that may affect Lift risk of falls: Patient Identification Verified: Yes Signs or symptoms of abuse/neglect since last visito No Secondary Verification Process Yes Hospitalized since last visit: No Completed: Implantable device outside of the clinic excluding No Patient Requires Transmission-Based No cellular tissue based products placed in the center Precautions: since last visit: Patient Has Alerts: No Has Dressing in Place as Prescribed: Yes Pain Present Now: No Electronic Signature(s) Signed: 04/14/2018 4:20:38 PM By: Alric Quan Entered By: Alric Quan on 04/14/2018 14:53:06 Virginia [name] ([id number]) -------------------------------------------------------------------------------- Clinic Level of Care Assessment Details Patient Name: Virginia Harold A. Date of Service: 04/14/2018 2:45 PM Medical Record Number: [id number] Patient Account Number: [id number] Date of Birth/Sex: Jun 03, 1936 (82 y.o. F) Treating RN: Cornell Barman Primary Care Arkeem Harts: Sherrie Mustache Other Clinician: Referring Tonie Elsey: Sherrie Mustache Treating Eldin Bonsell/Extender: Tito Dine in  Treatment: 5 Clinic Level of Care Assessment Items TOOL 4 Quantity Score []  - Use when only an EandM is performed on FOLLOW-UP visit 0 ASSESSMENTS - Nursing Assessment / Reassessment []  - Reassessment of Co-morbidities (includes updates in patient status) 0 X- 1 5 Reassessment of Adherence to Treatment Plan ASSESSMENTS - Wound and Skin Assessment / Reassessment X - Simple Wound Assessment / Reassessment - one wound 1 5 []  - 0 Complex Wound Assessment / Reassessment - multiple wounds []  - 0 Dermatologic / Skin Assessment (not related to wound area) ASSESSMENTS - Focused Assessment []  - Circumferential Edema Measurements - multi extremities 0 []  - 0 Nutritional Assessment / Counseling / Intervention []  - 0 Lower Extremity Assessment (monofilament, tuning fork, pulses) []  - 0 Peripheral Arterial Disease Assessment (using hand held doppler) ASSESSMENTS - Ostomy and/or Continence Assessment and Care []  - Incontinence Assessment and Management 0 []  - 0 Ostomy Care Assessment and Management (repouching, etc.) PROCESS - Coordination of Care X - Simple Patient / Family Education for ongoing care 1 15 []  - 0 Complex (extensive) Patient / Family Education for ongoing care []  - 0 Staff obtains Programmer, systems, Records, Test Results / Process Orders []  - 0 Staff telephones HHA, Nursing Homes / Clarify orders / etc []  - 0 Routine Transfer to another Facility (non-emergent condition) []  - 0 Routine Hospital Admission (non-emergent condition) []  - 0 New Admissions / Biomedical engineer / Ordering NPWT, Apligraf, etc. []  - 0 Emergency Hospital Admission (emergent condition) X- 1 10 Simple Discharge Coordination Virginia [name], Virginia A. ([id number]) []  - 0 Complex (extensive) Discharge Coordination PROCESS - Special Needs []  - Pediatric / Minor Patient Management 0 []  - 0 Isolation Patient Management []  - 0 Hearing / Language / Visual special needs []  - 0 Assessment of Community assistance  (transportation, D/C planning, etc.) []  - 0 Additional assistance / Altered mentation []  - 0 Support Surface(s) Assessment (bed, cushion, seat, etc.) INTERVENTIONS - Wound Cleansing / Measurement X - Simple Wound Cleansing -  one wound 1 5 []  - 0 Complex Wound Cleansing - multiple wounds X- 1 5 Wound Imaging (photographs - any number of wounds) []  - 0 Wound Tracing (instead of photographs) X- 1 5 Simple Wound Measurement - one wound []  - 0 Complex Wound Measurement - multiple wounds INTERVENTIONS - Wound Dressings []  - Small Wound Dressing one or multiple wounds 0 X- 1 15 Medium Wound Dressing one or multiple wounds []  - 0 Large Wound Dressing one or multiple wounds []  - 0 Application of Medications - topical []  - 0 Application of Medications - injection INTERVENTIONS - Miscellaneous []  - External ear exam 0 []  - 0 Specimen Collection (cultures, biopsies, blood, body fluids, etc.) []  - 0 Specimen(s) / Culture(s) sent or taken to Lab for analysis []  - 0 Patient Transfer (multiple staff / Civil Service fast streamer / Similar devices) []  - 0 Simple Staple / Suture removal (25 or less) []  - 0 Complex Staple / Suture removal (26 or more) []  - 0 Hypo / Hyperglycemic Management (close monitor of Blood Glucose) []  - 0 Ankle / Brachial Index (ABI) - do not check if billed separately X- 1 5 Vital Signs Virginia [name], Virginia A. ([id number]) Has the patient been seen at the hospital within the last three years: Yes Total Score: 70 Level Of Care: New/Established - Level 2 Electronic Signature(s) Signed: 04/14/2018 4:52:44 PM By: Gretta Cool, BSN, RN, CWS, Kim RN, BSN Entered By: Gretta Cool, BSN, RN, CWS, Kim on 04/14/2018 15:10:24 Virginia [name] ([id number]) -------------------------------------------------------------------------------- Encounter Discharge Information Details Patient Name: Virginia Harold A. Date of Service: 04/14/2018 2:45 PM Medical Record Number: [id number] Patient Account Number:  [id number] Date of Birth/Sex: 09-14-1936 (82 y.o. F) Treating RN: Roger Shelter Primary Care Hisham Provence: Sherrie Mustache Other Clinician: Referring Kairos Panetta: Sherrie Mustache Treating Marticia Reifschneider/Extender: Tito Dine in Treatment: 5 Encounter Discharge Information Items Discharge Condition: Stable Ambulatory Status: Wheelchair Discharge Destination: Home Transportation: Private Auto Schedule Follow-up Appointment: Yes Clinical Summary of Care: Electronic Signature(s) Signed: 04/14/2018 3:23:03 PM By: Roger Shelter Entered By: Roger Shelter on 04/14/2018 15:19:56 Virginia [name] ([id number]) -------------------------------------------------------------------------------- Lower Extremity Assessment Details Patient Name: Virginia Harold A. Date of Service: 04/14/2018 2:45 PM Medical Record Number: [id number] Patient Account Number: [id number] Date of Birth/Sex: 1936-02-15 (82 y.o. F) Treating RN: Ahmed Prima Primary Care Zayne Draheim: Sherrie Mustache Other Clinician: Referring Neil Errickson: Sherrie Mustache Treating Gayanne Prescott/Extender: Tito Dine in Treatment: 5 Vascular Assessment Pulses: Dorsalis Pedis Palpable: [Right:Yes] Posterior Tibial Extremity colors, hair growth, and conditions: Extremity Color: [Right:Normal] Temperature of Extremity: [Right:Warm] Capillary Refill: [Right:< 3 seconds] Toe Nail Assessment Left: Right: Thick: No Discolored: No Deformed: No Improper Length and Hygiene: No Electronic Signature(s) Signed: 04/14/2018 4:20:38 PM By: Alric Quan Entered By: Alric Quan on 04/14/2018 15:01:09 Virginia [name] ([id number]) -------------------------------------------------------------------------------- Multi Wound Chart Details Patient Name: Virginia Harold A. Date of Service: 04/14/2018 2:45 PM Medical Record Number: [id number] Patient Account Number: [id number] Date of Birth/Sex: May 08, 1936 (82 y.o. F) Treating RN: Cornell Barman Primary Care Branon Sabine: Sherrie Mustache Other Clinician: Referring Tracen Mahler: Sherrie Mustache Treating Mahayla Haddaway/Extender: Tito Dine in Treatment: 5 Vital Signs Height(in): 66 Pulse(bpm): 34 Weight(lbs): 196 Blood Pressure(mmHg): 112/54 Body Mass Index(BMI): 35 Temperature(F): 98.1 Respiratory Rate 16 (breaths/min): Photos: [1:No Photos] [N/A:N/A] Wound Location: [1:Right Lower Leg - Posterior] [N/A:N/A] Wounding Event: [1:Trauma] [N/A:N/A] Primary Etiology: [1:Venous Leg Ulcer] [N/A:N/A] Comorbid History: [1:Cataracts, Chronic Obstructive N/A Pulmonary Disease (COPD), Congestive Heart Failure, Coronary Artery Disease, Hypertension, Osteoarthritis] Date Acquired: [1:01/30/2018] [N/A:N/A] Weeks of Treatment: [1:5] [N/A:N/A] Wound  Status: [1:Open] [N/A:N/A] Measurements L x W x D [1:0.3x0.3x0.3] [N/A:N/A] (cm) Area (cm) : [1:0.071] [N/A:N/A] Volume (cm) : [1:0.021] [N/A:N/A] % Reduction in Area: [1:93.70%] [N/A:N/A] % Reduction in Volume: [1:90.70%] [N/A:N/A] Classification: [1:Full Thickness Without Exposed Support Structures] [N/A:N/A] Exudate Amount: [1:Medium] [N/A:N/A] Exudate Type: [1:Serosanguineous] [N/A:N/A] Exudate Color: [1:red, brown] [N/A:N/A] Wound Margin: [1:Flat and Intact] [N/A:N/A] Granulation Amount: [1:Medium (34-66%)] [N/A:N/A] Granulation Quality: [1:Pink] [N/A:N/A] Necrotic Amount: [1:Medium (34-66%)] [N/A:N/A] Exposed Structures: [1:Fat Layer (Subcutaneous Tissue) Exposed: Yes Fascia: No Tendon: No Muscle: No Joint: No Bone: No] [N/A:N/A] Epithelialization: [1:None] [N/A:N/A] Periwound Skin Texture: [1:Excoriation: No Induration: No] [N/A:N/A] Callus: No Crepitus: No Rash: No Scarring: No Periwound Skin Moisture: Maceration: Yes N/A N/A Dry/Scaly: No Periwound Skin Color: Ecchymosis: Yes N/A N/A Atrophie Blanche: No Cyanosis: No Erythema: No Hemosiderin Staining: No Mottled: No Pallor: No Rubor: No Temperature: No  Abnormality N/A N/A Tenderness on Palpation: Yes N/A N/A Wound Preparation: Ulcer Cleansing: N/A N/A Rinsed/Irrigated with Saline Topical Anesthetic Applied: None, Other: spray benzocaine Treatment Notes Wound #1 (Right, Posterior Lower Leg) 1. Cleansed with: Clean wound with Normal Saline 2. Anesthetic Topical Lidocaine 4% cream to wound bed prior to debridement 4. Dressing Applied: Santyl Ointment Iodoform packing Gauze 5. Secondary Dressing Applied Bordered Foam Dressing Notes Packing strips Electronic Signature(s) Signed: 04/14/2018 4:50:03 PM By: Linton Ham MD Entered By: Linton Ham on 04/14/2018 15:25:49 Virginia [name] ([id number]) -------------------------------------------------------------------------------- Multi-Disciplinary Care Plan Details Patient Name: Virginia Harold A. Date of Service: 04/14/2018 2:45 PM Medical Record Number: [id number] Patient Account Number: [id number] Date of Birth/Sex: 02/14/1936 (82 y.o. F) Treating RN: Cornell Barman Primary Care Kyarah Enamorado: Sherrie Mustache Other Clinician: Referring Candra Wegner: Sherrie Mustache Treating Joyce Leckey/Extender: Tito Dine in Treatment: 5 Active Inactive ` Abuse / Safety / Falls / Self Care Management Nursing Diagnoses: History of Falls Potential for falls Goals: Patient will not experience any injury related to falls Date Initiated: 03/10/2018 Target Resolution Date: 04/09/2018 Goal Status: Active Interventions: Podiatry chair, stretcher in low position and side rails up as needed Assess fall risk on admission and as needed Notes: ` Orientation to the Wound Care Program Nursing Diagnoses: Knowledge deficit related to the wound healing center program Goals: Patient/caregiver will verbalize understanding of the [place] Date Initiated: 03/10/2018 Target Resolution Date: 04/09/2018 Goal Status: Active Interventions: Provide education on orientation to the wound  center Notes: ` Soft Tissue Infection Nursing Diagnoses: Impaired tissue integrity Potential for infection: soft tissue Goals: Patient will remain free of wound infection Date Initiated: 03/10/2018 Target Resolution Date: 04/09/2018 Virginia [name], Virginia [name] ([id number]) Goal Status: Active Interventions: Assess signs and symptoms of infection every visit Notes: ` Wound/Skin Impairment Nursing Diagnoses: Impaired tissue integrity Goals: Ulcer/skin breakdown will have a volume reduction of 80% by week 12 Date Initiated: 03/10/2018 Target Resolution Date: 07/10/2018 Goal Status: Active Interventions: Provide education on ulcer and skin care Treatment Activities: Skin care regimen initiated : 03/10/2018 Topical wound management initiated : 03/10/2018 Notes: Electronic Signature(s) Signed: 04/14/2018 4:52:44 PM By: Gretta Cool, BSN, RN, CWS, Kim RN, BSN Entered By: Gretta Cool, BSN, RN, CWS, Kim on 04/14/2018 15:07:27 Virginia [name] ([id number]) -------------------------------------------------------------------------------- Pain Assessment Details Patient Name: Virginia Harold A. Date of Service: 04/14/2018 2:45 PM Medical Record Number: [id number] Patient Account Number: [id number] Date of Birth/Sex: Feb 13, 1936 (82 y.o. F) Treating RN: Ahmed Prima Primary Care Korina Tretter: Sherrie Mustache Other Clinician: Referring Alanda Colton: Sherrie Mustache Treating Millisa Giarrusso/Extender: Tito Dine in Treatment: 5 Active Problems Location of Pain Severity and Description of Pain Patient  Has Paino No Site Locations Pain Management and Medication Current Pain Management: Electronic Signature(s) Signed: 04/14/2018 4:20:38 PM By: Alric Quan Entered By: Alric Quan on 04/14/2018 14:53:13 Virginia [name] ([id number]) -------------------------------------------------------------------------------- Patient/Caregiver Education Details Patient Name: Virginia Harold A. Date of Service: 04/14/2018 2:45  PM Medical Record Number: [id number] Patient Account Number: [id number] Date of Birth/Gender: 1936-07-14 (82 y.o. F) Treating RN: Roger Shelter Primary Care Physician: Sherrie Mustache Other Clinician: Referring Physician: Sherrie Mustache Treating Physician/Extender: Tito Dine in Treatment: 5 Education Assessment Education Provided To: Patient Education Topics Provided Wound Debridement: Handouts: Wound Debridement Methods: Explain/Verbal Responses: State content correctly Wound/Skin Impairment: Handouts: Caring for Your Ulcer Methods: Explain/Verbal Responses: State content correctly Electronic Signature(s) Signed: 04/14/2018 3:23:03 PM By: Roger Shelter Entered By: Roger Shelter on 04/14/2018 15:20:15 Virginia [name] ([id number]) -------------------------------------------------------------------------------- Wound Assessment Details Patient Name: Virginia Harold A. Date of Service: 04/14/2018 2:45 PM Medical Record Number: [id number] Patient Account Number: [id number] Date of Birth/Sex: May 16, 1936 (82 y.o. F) Treating RN: Ahmed Prima Primary Care Archit Leger: Sherrie Mustache Other Clinician: Referring Febe Champa: Sherrie Mustache Treating Jacaria Colburn/Extender: Tito Dine in Treatment: 5 Wound Status Wound Number: 1 Primary Venous Leg Ulcer Etiology: Wound Location: Right Lower Leg - Posterior Wound Open Wounding Event: Trauma Status: Date Acquired: 01/30/2018 Comorbid Cataracts, Chronic Obstructive Pulmonary Weeks Of Treatment: 5 History: Disease (COPD), Congestive Heart Failure, Clustered Wound: No Coronary Artery Disease, Hypertension, Osteoarthritis Wound Measurements Length: (cm) 0.3 Width: (cm) 0.3 Depth: (cm) 0.3 Area: (cm) 0.071 Volume: (cm) 0.021 % Reduction in Area: 93.7% % Reduction in Volume: 90.7% Epithelialization: None Tunneling: No Undermining: No Wound Description Full Thickness Without Exposed  Support Classification: Structures Wound Margin: Flat and Intact Exudate Medium Amount: Exudate Type: Serosanguineous Exudate Color: red, brown Foul Odor After Cleansing: No Slough/Fibrino Yes Wound Bed Granulation Amount: Medium (34-66%) Exposed Structure Granulation Quality: Pink Fascia Exposed: No Necrotic Amount: Medium (34-66%) Fat Layer (Subcutaneous Tissue) Exposed: Yes Necrotic Quality: Adherent Slough Tendon Exposed: No Muscle Exposed: No Joint Exposed: No Bone Exposed: No Periwound Skin Texture Texture Color No Abnormalities Noted: No No Abnormalities Noted: No Callus: No Atrophie Blanche: No Crepitus: No Cyanosis: No Excoriation: No Ecchymosis: Yes Induration: No Erythema: No Rash: No Hemosiderin Staining: No Scarring: No Mottled: No Pallor: No Moisture Rubor: No No Abnormalities Noted: No Virginia [name], Virginia A. ([id number]) Dry / Scaly: No Temperature / Pain Maceration: Yes Temperature: No Abnormality Tenderness on Palpation: Yes Wound Preparation Ulcer Cleansing: Rinsed/Irrigated with Saline Topical Anesthetic Applied: None, Other: spray benzocaine, Treatment Notes Wound #1 (Right, Posterior Lower Leg) 1. Cleansed with: Clean wound with Normal Saline 2. Anesthetic Topical Lidocaine 4% cream to wound bed prior to debridement 4. Dressing Applied: Santyl Ointment Iodoform packing Gauze 5. Secondary Dressing Applied Bordered Foam Dressing Notes Packing strips Electronic Signature(s) Signed: 04/14/2018 4:20:38 PM By: Alric Quan Entered By: Alric Quan on 04/14/2018 14:59:51 Virginia [name] ([id number]) -------------------------------------------------------------------------------- Vitals Details Patient Name: Virginia Harold A. Date of Service: 04/14/2018 2:45 PM Medical Record Number: [id number] Patient Account Number: [id number] Date of Birth/Sex: December 28, 1935 (82 y.o. F) Treating RN: Ahmed Prima Primary Care Zonya Gudger: Sherrie Mustache Other Clinician: Referring Saivion Goettel: Sherrie Mustache Treating Anthoney Sheppard/Extender: Tito Dine in Treatment: 5 Vital Signs Time Taken: 14:53 Temperature (F): 98.1 Height (in): 63 Pulse (bpm): 84 Weight (lbs): 196 Respiratory Rate (breaths/min): 16 Body Mass Index (BMI): 34.7 Blood Pressure (mmHg): 112/54 Reference Range: 80 - 120 mg / dl Electronic Signature(s) Signed: 04/14/2018 4:20:38 PM By: Alric Quan Entered By: Carolyne Fiscal,  Debra on 04/14/2018 14:58:21

## 2018-04-19 ENCOUNTER — Encounter: Payer: Self-pay | Admitting: Allergy & Immunology

## 2018-04-19 ENCOUNTER — Ambulatory Visit (INDEPENDENT_AMBULATORY_CARE_PROVIDER_SITE_OTHER): Payer: Medicare Other | Admitting: Allergy & Immunology

## 2018-04-19 VITALS — BP 110/70 | HR 92 | Resp 12

## 2018-04-19 DIAGNOSIS — I6529 Occlusion and stenosis of unspecified carotid artery: Secondary | ICD-10-CM

## 2018-04-19 DIAGNOSIS — J310 Chronic rhinitis: Secondary | ICD-10-CM

## 2018-04-19 DIAGNOSIS — J449 Chronic obstructive pulmonary disease, unspecified: Secondary | ICD-10-CM | POA: Diagnosis not present

## 2018-04-19 MED ORDER — CARBINOXAMINE MALEATE ER 4 MG/5ML PO SUER: 7.5000 mL | Freq: Two times a day (BID) | ORAL | 5 refills | Status: DC | PRN

## 2018-04-19 MED ORDER — FLUTICASONE FUROATE-VILANTEROL 100-25 MCG/INH IN AEPB: 1.0000 | INHALATION_SPRAY | Freq: Every day | RESPIRATORY_TRACT | 5 refills | Status: DC

## 2018-04-19 MED ORDER — FLUTICASONE PROPIONATE 50 MCG/ACT NA SUSP: 2.0000 | Freq: Every day | NASAL | 5 refills | Status: DC

## 2018-04-19 NOTE — Progress Notes (Signed)
FOLLOW UP  Date of Service/Encounter:  04/19/18   Assessment:   Chronic obstructive pulmonary disease  Non-allergic rhinitis  Plan/Recommendations:   1. Chronic obstructive pulmonary disease - We will continue with the Breo since this seems to be going well.  - I did offer an alternative medication but she prefers the ease of the Breo.  - Daily controller medication(s): Breo 100/25 one puff once daily - Rescue medications: albuterol 4 puffs every 4-6 hours as needed, albuterol nebulizer one vial puffs every 4-6 hours as needed or DuoNeb nebulizer one vial every 4-6 hours as needed - Asthma control goals:  * Full participation in all desired activities (may need albuterol before activity) * Albuterol use two time or less a week on average (not counting use with activity) * Cough interfering with sleep two time or less a month * Oral steroids no more than once a year * No hospitalizations  2. Non-allergic rhinitis - Continue Xyzal (levocetirizine) 5mg  tablet once daily and Flonase (fluticason) one spray per nostril daily and Karbinal ER 7.52mL twice daily as needed - I did emphasize that the Cristino Martes could be used as needed to save money.  - You can use an extra dose of the antihistamine, if needed, for breakthrough symptoms.  - Consider nasal saline rinses 1-2 times daily to remove allergens from the nasal cavities as well as help with mucous clearance (this is especially helpful to do before the nasal sprays are given).  3. Return in about 6 months (around 10/20/2018).   Subjective:   Virginia [name] is a 82 y.o. female presenting today for follow up of  Chief Complaint  Patient presents with  . Medication Refill    doing well  . Asthma  . Allergies    Virginia [name] has a history of the following: Patient Active Problem List   Diagnosis Date Noted  . Primary osteoarthritis involving multiple joints 07/08/2017  . Dementia with behavioral disturbance 07/08/2017  .  Anxiety 07/08/2017  . Gastroesophageal reflux disease 07/08/2017  . Hypotension 02/01/2017  . CKD (chronic kidney disease), stage III ([place]) 02/01/2017  . Cough 01/31/2016  . Vitamin D deficiency 02/15/2015  . Hyperlipidemia 01/18/2015  . COPD GOLD IV 12/30/2014  . Chronic diastolic CHF (congestive heart failure) ([place]) 12/08/2014  . Dyspnea   . Congestive heart disease ([place])   . Hyponatremia 12/02/2014  . Essential hypertension 12/02/2014  . Nausea and vomiting 11/30/2014  . Memory disorder 10/24/2014  . Carotid stenosis 10/19/2012    History obtained from: chart review and the patient as well as her brother who takes care of her on a routine basis.  Gayland Curry Mester's Primary Care Provider is Lauree Chandler, NP.     Dan is a 82 y.o. female presenting for a follow up visit. She was last seen in August 2018 for an office visit. At that time, she was doing very well. We had previously put her on Breo one puff once daily to make her medication regimen easier. She has a history of COPD, diagnosed years ago, and due to reversibility and symptoms of SOB with ADLs, we added on Breo. She has done well with this addition. She has a history of NAR which was controlled with the use of cetirizine as well fluticasone one spray per nostril daily. We also added on Karbinal for an additive effect to help with postnasal drip and rhinorrhea.   Since the last visit, Virginia [name] has done very well. She remains  on the Breo, taking one puff once daily. She is happy with the ease of this regimen. Virginia [name]'s asthma has been well controlled. She has not required rescue medication, experienced nocturnal awakenings due to lower respiratory symptoms, nor have activities of daily living been limited. She has required no Emergency Department or Urgent Care visits for her asthma. She has required zero courses of systemic steroids for asthma exacerbations since the last visit. ACT score today is 20, indicating excellent asthma  symptom control.    Rhinitis has been well controlled with the fluticasone nasal spray. She also faithfully takes the [place] (her brother did not realize that this was a PRN medications; evidently this costs her $95 per bottle). She has not had intervening sinus infections or problems with rhinitis since the last visit.   Otherwise, there have been no changes to her past medical history, surgical history, family history, or social history. She continues to live with her son - [name] - who is also a patient of mine.    Review of Systems: a 14-point review of systems is pertinent for what is mentioned in HPI.  Otherwise, all other systems were negative. Constitutional: negative other than that listed in the HPI Eyes: negative other than that listed in the HPI Ears, nose, mouth, throat, and face: negative other than that listed in the HPI Respiratory: negative other than that listed in the HPI Cardiovascular: negative other than that listed in the HPI Gastrointestinal: negative other than that listed in the HPI Genitourinary: negative other than that listed in the HPI Integument: negative other than that listed in the HPI Hematologic: negative other than that listed in the HPI Musculoskeletal: negative other than that listed in the HPI Neurological: negative other than that listed in the HPI Allergy/Immunologic: negative other than that listed in the HPI    Objective:   Blood pressure 110/70, pulse 92, resp. rate 12. There is no height or weight on file to calculate BMI.   Physical Exam:  General: Alert, interactive, in no acute distress. Very adorable female.  Eyes: No conjunctival injection bilaterally, no discharge on the right, no discharge on the left and no Horner-Trantas dots present. PERRL bilaterally. EOMI without pain. No photophobia.  Ears: Right TM pearly gray with normal light reflex, Left TM pearly gray with normal light reflex, Right TM intact without perforation and Left  TM intact without perforation.  Nose/Throat: External nose within normal limits and septum midline. Turbinates edematous and pale with clear discharge. Posterior oropharynx erythematous without cobblestoning in the posterior oropharynx. Tonsils 2+ without exudates.  Tongue without thrush. Lungs: Clear to auscultation without wheezing, rhonchi or rales. No increased work of breathing. CV: Normal S1/S2. No murmurs. Capillary refill <2 seconds.  Skin: Warm and dry, without lesions or rashes. Neuro:   Grossly intact. No focal deficits appreciated. Responsive to questions.  Diagnostic studies: none (attempted to obtain a spiro but it was not interpretable)    Salvatore Marvel, MD  [facility]

## 2018-04-19 NOTE — Patient Instructions (Addendum)
1. Chronic obstructive pulmonary disease - We will continue with the Breo since this seems to be going well.  - Daily controller medication(s): Breo 100/25 one puff once daily - Rescue medications: albuterol 4 puffs every 4-6 hours as needed, albuterol nebulizer one vial puffs every 4-6 hours as needed or DuoNeb nebulizer one vial every 4-6 hours as needed - Asthma control goals:  * Full participation in all desired activities (may need albuterol before activity) * Albuterol use two time or less a week on average (not counting use with activity) * Cough interfering with sleep two time or less a month * Oral steroids no more than once a year * No hospitalizations  2. Non-allergic rhinitis - Continue Xyzal (levocetirizine) 5mg  tablet once daily and Flonase (fluticason) one spray per nostril daily and Karbinal ER 7.78mL twice daily as needed - You can use an extra dose of the antihistamine, if needed, for breakthrough symptoms.  - Consider nasal saline rinses 1-2 times daily to remove allergens from the nasal cavities as well as help with mucous clearance (this is especially helpful to do before the nasal sprays are given).  3. Return in about 6 months (around 10/20/2018).   Please inform us of any Emergency Department visits, hospitalizations, or changes in symptoms. Call us before going to the ED for breathing or allergy symptoms since we might be able to fit you in for a sick visit. Feel free to contact us anytime with any questions, problems, or concerns.  It was a pleasure to see you and your family again today!   Websites that have reliable patient information: 1. American Academy of Asthma, Allergy, and Immunology: www.aaaai.org 2. Food Allergy Research and Education (FARE): foodallergy.org 3. Mothers of Asthmatics: http://www.asthmacommunitynetwork.org 4. American College of Allergy, Asthma, and Immunology: www.acaai.org

## 2018-04-21 ENCOUNTER — Encounter: Payer: Medicare Other | Admitting: Internal Medicine

## 2018-04-21 DIAGNOSIS — I110 Hypertensive heart disease with heart failure: Secondary | ICD-10-CM | POA: Diagnosis not present

## 2018-04-21 DIAGNOSIS — I872 Venous insufficiency (chronic) (peripheral): Secondary | ICD-10-CM | POA: Diagnosis not present

## 2018-04-21 DIAGNOSIS — F0390 Unspecified dementia without behavioral disturbance: Secondary | ICD-10-CM | POA: Diagnosis not present

## 2018-04-21 DIAGNOSIS — I509 Heart failure, unspecified: Secondary | ICD-10-CM | POA: Diagnosis not present

## 2018-04-21 DIAGNOSIS — L97312 Non-pressure chronic ulcer of right ankle with fat layer exposed: Secondary | ICD-10-CM | POA: Diagnosis not present

## 2018-04-21 DIAGNOSIS — J449 Chronic obstructive pulmonary disease, unspecified: Secondary | ICD-10-CM | POA: Diagnosis not present

## 2018-04-21 DIAGNOSIS — I2510 Atherosclerotic heart disease of native coronary artery without angina pectoris: Secondary | ICD-10-CM | POA: Diagnosis not present

## 2018-04-21 DIAGNOSIS — L97212 Non-pressure chronic ulcer of right calf with fat layer exposed: Secondary | ICD-10-CM | POA: Diagnosis not present

## 2018-04-22 ENCOUNTER — Telehealth: Payer: Self-pay

## 2018-04-22 NOTE — Telephone Encounter (Signed)
Phone call placed to patient's daughter to offer to schedule a visit with Palliative Care. Unable to leave VM due to mailbox being full.

## 2018-04-23 NOTE — Progress Notes (Signed)
DASIA, GUERRIER ([id number]) Visit Report for 04/21/2018 Arrival Information Details Patient Name: Virginia [name], Virginia [name]. Date of Service: 04/21/2018 1:15 PM Medical Record Number: [id number] Patient Account Number: [id number] Date of Birth/Sex: [birth year] (82 y.o. F) Treating RN: Montey Hora Primary Care Ike Maragh: Sherrie Mustache Other Clinician: Referring Dalena Plantz: Sherrie Mustache Treating Taylyn Brame/Extender: Tito Dine in Treatment: 6 Visit Information History Since Last Visit Added or deleted any medications: No Patient Arrived: Wheel Chair Any new allergies or adverse reactions: No Arrival Time: 13:30 Had a fall or experienced change in No activities of daily living that may affect Accompanied By: brother risk of falls: Transfer Assistance: Manual Signs or symptoms of abuse/neglect since last visito No Patient Identification Verified: Yes Hospitalized since last visit: No Secondary Verification Process Completed: Yes Implantable device outside of the clinic excluding No Patient Requires Transmission-Based No cellular tissue based products placed in the center Precautions: since last visit: Patient Has Alerts: No Has Dressing in Place as Prescribed: Yes Pain Present Now: No Electronic Signature(s) Signed: 04/21/2018 2:32:55 PM By: Montey Hora Entered By: Montey Hora on 04/21/2018 14:32:55 [name] ([id number]) -------------------------------------------------------------------------------- Encounter Discharge Information Details Patient Name: Virginia Harold A. Date of Service: 04/21/2018 1:15 PM Medical Record Number: [id number] Patient Account Number: [id number] Date of Birth/Sex: 1936-11-13 (82 y.o. F) Treating RN: Montey Hora Primary Care Valrie Jia: Sherrie Mustache Other Clinician: Referring Aleksander Edmiston: Sherrie Mustache Treating Roseann Kees/Extender: Tito Dine in Treatment: 6 Encounter Discharge Information Items Discharge Condition:  Stable Ambulatory Status: Wheelchair Discharge Destination: Home Transportation: Private Auto Accompanied By: brother Schedule Follow-up Appointment: Yes Clinical Summary of Care: Electronic Signature(s) Signed: 04/21/2018 2:32:15 PM By: Montey Hora Entered By: Montey Hora on 04/21/2018 14:32:15 [name] ([id number]) -------------------------------------------------------------------------------- Lower Extremity Assessment Details Patient Name: Virginia Harold A. Date of Service: 04/21/2018 1:15 PM Medical Record Number: [id number] Patient Account Number: [id number] Date of Birth/Sex: 1936-01-10 (82 y.o. F) Treating RN: Montey Hora Primary Care Kadir Azucena: Sherrie Mustache Other Clinician: Referring Saria Haran: Sherrie Mustache Treating Casilda Pickerill/Extender: Tito Dine in Treatment: 6 Edema Assessment Assessed: [Left: No] [Right: No] Edema: [Left: N] [Right: o] Vascular Assessment Pulses: Dorsalis Pedis Palpable: [Right:Yes] Posterior Tibial Extremity colors, hair growth, and conditions: Extremity Color: [Right:Normal] Hair Growth on Extremity: [Right:No] Temperature of Extremity: [Right:Warm] Capillary Refill: [Right:< 3 seconds] Toe Nail Assessment Left: Right: Thick: Yes Discolored: No Deformed: No Improper Length and Hygiene: No Electronic Signature(s) Signed: 04/21/2018 4:55:05 PM By: Montey Hora Entered By: Montey Hora on 04/21/2018 13:37:50 [name] ([id number]) -------------------------------------------------------------------------------- Multi Wound Chart Details Patient Name: Virginia Harold A. Date of Service: 04/21/2018 1:15 PM Medical Record Number: [id number] Patient Account Number: [id number] Date of Birth/Sex: 11-Jul-1936 (82 y.o. F) Treating RN: Cornell Barman Primary Care Zerrick Hanssen: Sherrie Mustache Other Clinician: Referring Nerissa Constantin: Sherrie Mustache Treating Willella Harding/Extender: Tito Dine in Treatment: 6 Vital  Signs Height(in): 63 Pulse(bpm): 71 Weight(lbs): 196 Blood Pressure(mmHg): 148/64 Body Mass Index(BMI): 35 Temperature(F): 98.5 Respiratory Rate 16 (breaths/min): Photos: [1:No Photos] [N/A:N/A] Wound Location: [1:Right Lower Leg - Posterior] [N/A:N/A] Wounding Event: [1:Trauma] [N/A:N/A] Primary Etiology: [1:Venous Leg Ulcer] [N/A:N/A] Comorbid History: [1:Cataracts, Chronic Obstructive N/A Pulmonary Disease (COPD), Congestive Heart Failure, Coronary Artery Disease, Hypertension, Osteoarthritis] Date Acquired: [1:01/30/2018] [N/A:N/A] Weeks of Treatment: [1:6] [N/A:N/A] Wound Status: [1:Open] [N/A:N/A] Measurements L x W x D [1:0.3x0.4x0.2] [N/A:N/A] (cm) Area (cm) : [1:0.094] [N/A:N/A] Volume (cm) : [1:0.019] [N/A:N/A] % Reduction in Area: [1:91.60%] [N/A:N/A] % Reduction in Volume: [1:91.60%] [N/A:N/A] Classification: [1:Full Thickness Without Exposed Support Structures] [N/A:N/A] Exudate  Amount: [1:Medium] [N/A:N/A] Exudate Type: [1:Serosanguineous] [N/A:N/A] Exudate Color: [1:red, brown] [N/A:N/A] Wound Margin: [1:Flat and Intact] [N/A:N/A] Granulation Amount: [1:Medium (34-66%)] [N/A:N/A] Granulation Quality: [1:Pink] [N/A:N/A] Necrotic Amount: [1:Medium (34-66%)] [N/A:N/A] Exposed Structures: [1:Fat Layer (Subcutaneous Tissue) Exposed: Yes Fascia: No Tendon: No Muscle: No Joint: No Bone: No] [N/A:N/A] Epithelialization: [1:None] [N/A:N/A] Debridement: [1:Debridement - Selective/Open Wound] [N/A:N/A] Pre-procedure 14:15 N/A N/A Verification/Time Out Taken: Pain Control: Other N/A N/A Tissue Debrided: Slough N/A N/A Level: Non-Viable Tissue N/A N/A Debridement Area (sq cm): 0.12 N/A N/A Instrument: Curette N/A N/A Bleeding: None N/A N/A Procedural Pain: 3 N/A N/A Post Procedural Pain: 1 N/A N/A Debridement Treatment Procedure was tolerated well N/A N/A Response: Post Debridement 0.3x0.4x0.2 N/A N/A Measurements L x W x D (cm) Post Debridement Volume: 0.019  N/A N/A (cm) Periwound Skin Texture: Excoriation: No N/A N/A Induration: No Callus: No Crepitus: No Rash: No Scarring: No Periwound Skin Moisture: Maceration: Yes N/A N/A Dry/Scaly: No Periwound Skin Color: Ecchymosis: Yes N/A N/A Atrophie Blanche: No Cyanosis: No Erythema: No Hemosiderin Staining: No Mottled: No Pallor: No Rubor: No Temperature: No Abnormality N/A N/A Tenderness on Palpation: Yes N/A N/A Wound Preparation: Ulcer Cleansing: N/A N/A Rinsed/Irrigated with Saline Topical Anesthetic Applied: Other: lidocaine 4% Procedures Performed: Debridement N/A N/A Treatment Notes Wound #1 (Right, Posterior Lower Leg) 1. Cleansed with: Clean wound with Normal Saline 4. Dressing Applied: Santyl Ointment 5. Secondary Dressing Applied Bordered Foam Dressing Notes Packing strips Electronic Signature(s) Virginia [name], Virginia [name] ([id number]) Signed: 04/21/2018 5:10:30 PM By: Linton Ham MD Entered By: Linton Ham on 04/21/2018 15:04:47 [name] ([id number]) -------------------------------------------------------------------------------- Multi-Disciplinary Care Plan Details Patient Name: Virginia Harold A. Date of Service: 04/21/2018 1:15 PM Medical Record Number: [id number] Patient Account Number: [id number] Date of Birth/Sex: [birth year] (82 y.o. F) Treating RN: Cornell Barman Primary Care Krystall Kruckenberg: Sherrie Mustache Other Clinician: Referring Sharra Cayabyab: Sherrie Mustache Treating Davie Claud/Extender: Tito Dine in Treatment: 6 Active Inactive ` Abuse / Safety / Falls / Self Care Management Nursing Diagnoses: History of Falls Potential for falls Goals: Patient will not experience any injury related to falls Date Initiated: 03/10/2018 Target Resolution Date: 04/09/2018 Goal Status: Active Interventions: Podiatry chair, stretcher in low position and side rails up as needed Assess fall risk on admission and as needed Notes: ` Orientation to the Wound  Care Program Nursing Diagnoses: Knowledge deficit related to the wound healing center program Goals: Patient/caregiver will verbalize understanding of the [place] Date Initiated: 03/10/2018 Target Resolution Date: 04/09/2018 Goal Status: Active Interventions: Provide education on orientation to the wound center Notes: ` Soft Tissue Infection Nursing Diagnoses: Impaired tissue integrity Potential for infection: soft tissue Goals: Patient will remain free of wound infection Date Initiated: 03/10/2018 Target Resolution Date: 04/09/2018 Virginia [name], Virginia [name] ([id number]) Goal Status: Active Interventions: Assess signs and symptoms of infection every visit Notes: ` Wound/Skin Impairment Nursing Diagnoses: Impaired tissue integrity Goals: Ulcer/skin breakdown will have a volume reduction of 80% by week 12 Date Initiated: 03/10/2018 Target Resolution Date: 07/10/2018 Goal Status: Active Interventions: Provide education on ulcer and skin care Treatment Activities: Skin care regimen initiated : 03/10/2018 Topical wound management initiated : 03/10/2018 Notes: Electronic Signature(s) Signed: 04/21/2018 5:19:09 PM By: Gretta Cool, BSN, RN, CWS, Kim RN, BSN Entered By: Gretta Cool, BSN, RN, CWS, Kim on 04/21/2018 14:15:06 [name] ([id number]) -------------------------------------------------------------------------------- Pain Assessment Details Patient Name: Virginia Harold A. Date of Service: 04/21/2018 1:15 PM Medical Record Number: [id number] Patient Account Number: [id number] Date of Birth/Sex: 04-27-1936 (82 y.o. F) Treating RN:  Montey Hora Primary Care Kierstyn Baranowski: Sherrie Mustache Other Clinician: Referring Lolly Glaus: Sherrie Mustache Treating Iriana Artley/Extender: Tito Dine in Treatment: 6 Active Problems Location of Pain Severity and Description of Pain Patient Has Paino No Site Locations Pain Management and Medication Current Pain Management: Electronic  Signature(s) Signed: 04/21/2018 4:55:05 PM By: Montey Hora Entered By: Montey Hora on 04/21/2018 13:31:43 [name] ([id number]) -------------------------------------------------------------------------------- Patient/Caregiver Education Details Patient Name: Virginia Harold A. Date of Service: 04/21/2018 1:15 PM Medical Record Number: [id number] Patient Account Number: [id number] Date of Birth/Gender: 1936-06-25 (82 y.o. F) Treating RN: Montey Hora Primary Care Physician: Sherrie Mustache Other Clinician: Referring Physician: Sherrie Mustache Treating Physician/Extender: Tito Dine in Treatment: 6 Education Assessment Education Provided To: Patient and Caregiver Education Topics Provided Nutrition: Handouts: Nutrition Methods: Explain/Verbal Responses: State content correctly Electronic Signature(s) Signed: 04/21/2018 4:55:05 PM By: Montey Hora Entered By: Montey Hora on 04/21/2018 14:32:30 [name] ([id number]) -------------------------------------------------------------------------------- Wound Assessment Details Patient Name: Virginia Harold A. Date of Service: 04/21/2018 1:15 PM Medical Record Number: [id number] Patient Account Number: [id number] Date of Birth/Sex: March 20, 1936 (82 y.o. F) Treating RN: Montey Hora Primary Care Phila Shoaf: Sherrie Mustache Other Clinician: Referring Malgorzata Albert: Sherrie Mustache Treating Arabell Neria/Extender: Tito Dine in Treatment: 6 Wound Status Wound Number: 1 Primary Venous Leg Ulcer Etiology: Wound Location: Right Lower Leg - Posterior Wound Open Wounding Event: Trauma Status: Date Acquired: 01/30/2018 Comorbid Cataracts, Chronic Obstructive Pulmonary Weeks Of Treatment: 6 History: Disease (COPD), Congestive Heart Failure, Clustered Wound: No Coronary Artery Disease, Hypertension, Osteoarthritis Photos Photo Uploaded By: Montey Hora on 04/21/2018 16:11:32 Wound Measurements Length: (cm)  0.3 Width: (cm) 0.4 Depth: (cm) 0.2 Area: (cm) 0.094 Volume: (cm) 0.019 % Reduction in Area: 91.6% % Reduction in Volume: 91.6% Epithelialization: None Tunneling: No Undermining: No Wound Description Full Thickness Without Exposed Support Classification: Structures Wound Margin: Flat and Intact Exudate Medium Amount: Exudate Type: Serosanguineous Exudate Color: red, brown Foul Odor After Cleansing: No Slough/Fibrino Yes Wound Bed Granulation Amount: Medium (34-66%) Exposed Structure Granulation Quality: Pink Fascia Exposed: No Necrotic Amount: Medium (34-66%) Fat Layer (Subcutaneous Tissue) Exposed: Yes Necrotic Quality: Adherent Slough Tendon Exposed: No Muscle Exposed: No Joint Exposed: No Virginia [name], Virginia A. ([id number]) Bone Exposed: No Periwound Skin Texture Texture Color No Abnormalities Noted: No No Abnormalities Noted: No Callus: No Atrophie Blanche: No Crepitus: No Cyanosis: No Excoriation: No Ecchymosis: Yes Induration: No Erythema: No Rash: No Hemosiderin Staining: No Scarring: No Mottled: No Pallor: No Moisture Rubor: No No Abnormalities Noted: No Dry / Scaly: No Temperature / Pain Maceration: Yes Temperature: No Abnormality Tenderness on Palpation: Yes Wound Preparation Ulcer Cleansing: Rinsed/Irrigated with Saline Topical Anesthetic Applied: Other: lidocaine 4%, Treatment Notes Wound #1 (Right, Posterior Lower Leg) 1. Cleansed with: Clean wound with Normal Saline 4. Dressing Applied: Santyl Ointment 5. Secondary Dressing Applied Bordered Foam Dressing Notes Packing strips Electronic Signature(s) Signed: 04/21/2018 4:55:05 PM By: Montey Hora Entered By: Montey Hora on 04/21/2018 13:37:07 [name] ([id number]) -------------------------------------------------------------------------------- Vitals Details Patient Name: Virginia Harold A. Date of Service: 04/21/2018 1:15 PM Medical Record Number: [id number] Patient  Account Number: [id number] Date of Birth/Sex: March 19, 1936 (82 y.o. F) Treating RN: Montey Hora Primary Care Joliet Mallozzi: Sherrie Mustache Other Clinician: Referring Nevia Henkin: Sherrie Mustache Treating Imanie Darrow/Extender: Tito Dine in Treatment: 6 Vital Signs Time Taken: 13:31 Temperature (F): 98.5 Height (in): 63 Pulse (bpm): 69 Weight (lbs): 196 Respiratory Rate (breaths/min): 16 Body Mass Index (BMI): 34.7 Blood Pressure (mmHg): 148/64 Reference Range: 80 - 120 mg /  dl Electronic Signature(s) Signed: 04/21/2018 4:55:05 PM By: Montey Hora Entered By: Montey Hora on 04/21/2018 13:33:24

## 2018-04-23 NOTE — Progress Notes (Signed)
[name], [name] ([id number]) Visit Report for 04/21/2018 Debridement Details Patient Name: Virginia [name], Virginia A. Date of Service: 04/21/2018 1:15 PM Medical Record Number: [id number] Patient Account Number: [id number] Date of Birth/Sex: 27-Jan-1936 (82 y.o. F) Treating RN: Cornell Barman Primary Care Provider: Sherrie Mustache Other Clinician: Referring Provider: Sherrie Mustache Treating Provider/Extender: Tito Dine in Treatment: 6 Debridement Performed for Wound #1 Right,Posterior Lower Leg Assessment: Performed By: Physician Ricard Dillon, MD Debridement Type: Debridement Severity of Tissue Pre Fat layer exposed Debridement: Pre-procedure Verification/Time Yes - 14:15 Out Taken: Start Time: 14:15 Pain Control: Other : pain ease Total Area Debrided (L x W): 0.3 (cm) x 0.4 (cm) = 0.12 (cm) Tissue and other material Non-Viable, Slough, Subcutaneous, Slough debrided: Level: Skin/Subcutaneous Tissue Debridement Description: Excisional Instrument: Curette Bleeding: None End Time: 15:18 Procedural Pain: 3 Post Procedural Pain: 1 Response to Treatment: Procedure was tolerated well Level of Consciousness: Awake and Alert Post Procedure Vitals: Temperature: 98.5 Pulse: 69 Respiratory Rate: 16 Blood Pressure: Systolic Blood Pressure: 951 Diastolic Blood Pressure: 64 Post Debridement Measurements of Total Wound Length: (cm) 0.3 Width: (cm) 0.4 Depth: (cm) 0.2 Volume: (cm) 0.019 Character of Wound/Ulcer Post Debridement: Stable Severity of Tissue Post Debridement: Fat layer exposed Post Procedure Diagnosis Same as Pre-procedure Electronic Signature(s) [name], [name] ([id number]) Signed: 04/21/2018 5:10:30 PM By: Linton Ham MD Signed: 04/21/2018 5:19:09 PM By: Gretta Cool, BSN, RN, CWS, Kim RN, BSN Entered By: Linton Ham on 04/21/2018 15:05:39 [name] ([id number]) -------------------------------------------------------------------------------- HPI  Details Patient Name: Virginia Harold A. Date of Service: 04/21/2018 1:15 PM Medical Record Number: [id number] Patient Account Number: [id number] Date of Birth/Sex: 01-Nov-1936 (82 y.o. F) Treating RN: Cornell Barman Primary Care Provider: Sherrie Mustache Other Clinician: Referring Provider: Sherrie Mustache Treating Provider/Extender: Tito Dine in Treatment: 6 History of Present Illness HPI Description: 03/10/18; is an 82 year old woman who is followed at [facility]. She is here for an open wound on the right posterior leg. Apparently this started in February with a fall. She was seen in the ER on 01/16/18 and the area was sutured. At some point the sutures were removed she was seen in her primary office on 02/09/18 at which time the wound had opened. It was felt to have cellulitis with redness drainage and tenderness and she was given doxycycline and Santyl was prescribed. they have been using Santyl and changing it daily. The patient has some form of dementia and I think is cared for by her brother. It is her daughter who brought her today. They tell us that she is sometimes reluctant to get out of bed or reluctant to go to appointments. patient is not a diabetic. ABIs in this clinic were 0.95 on the right The patient has dementia, congestive heart failure, coronary artery disease, hypertension and COPD. She apparently falls fairly frequently. The reason for this is not completely clear 03/17/18; small but punched out wound on the right posterior calf 03/31/18; small but punched out wound on the right posterior calf. We've been using Santyl. This is initially felt to be traumatic on a bed frame although nobody is completely certain about that they simply noticed blood on the bed. 04/02/18 on evaluation today patient appears to be doing a little bit worse when she was seen two days ago. Fortunately she does not seem to have any evidence of severe infection although she does have some  erythema surrounding the wound bed. Unfortunately she's also had increased discomfort and pain secondary to what her  brother and sister feel is likely an infection. They state that she normally has a fairly high pain tolerance and has been really complaining of pain today. Patient does have dementia. 04/07/18; patient was in with erythema around the wound on 04/02/18. Culture that was done last time showed methicillin sensitive staph aureus which should've been covered by the trimethoprim/sulfamethoxazole we gave her. 04/14/18; the area on the right posterior calf which was initially a traumatic wound that was sutured but then the test. She is completing her antibiotics today. We've been using Santyl. Dimensions of the wound are smaller there is still some probing depth although very small wound. Somewhat firm around the wound which I think is probably scar tissue. She went for arterial studies. She was not able to tolerate ABIs. On the right she did have biphasic wave forms for the most part there was a 50-74% stenosis in the above-knee popliteal and a 30-49% stenosis in the common femoral artery. She has 2 vessel runoff E of the PTA and ATA. Peroneal artery was not visualized. The patient is not very ambulatory unsteady on her feet but I'm really not certain any of this represents claudication. 04/21/18; the patient has a small punched out area on the posterior calf. Covered in liquid slough nonviable subcutaneous tissue. This covered the tiny wound bed that is all reminiscent of this initial traumatic wound.we have been using Environmental health practitioner) Signed: 04/21/2018 5:10:30 PM By: Linton Ham MD Entered By: Linton Ham on 04/21/2018 15:09:24 [name] ([id number]) -------------------------------------------------------------------------------- Physical Exam Details Patient Name: Virginia Harold A. Date of Service: 04/21/2018 1:15 PM Medical Record Number: [id number] Patient  Account Number: [id number] Date of Birth/Sex: 1936/09/18 (82 y.o. F) Treating RN: Cornell Barman Primary Care Provider: Sherrie Mustache Other Clinician: Referring Provider: Sherrie Mustache Treating Provider/Extender: Tito Dine in Treatment: 6 Constitutional Patient is hypertensive.. Pulse regular and within target range for patient.Marland Kitchen Respirations regular, non-labored and within target range.. Temperature is normal and within the target range for the patient.Marland Kitchen appears in no distress. Notes wound exam; the patient's small wound had surface slough and nonviable subcutaneous tissue all of which I removed with the tip of a #15 blade. Hemostasis with direct pressure. Post debridement the base of this looks healthy there is no surrounding erythema. She has surrounding scar tissue presumably from trauma and suturing. Electronic Signature(s) Signed: 04/21/2018 5:10:30 PM By: Linton Ham MD Entered By: Linton Ham on 04/21/2018 15:10:34 [name] ([id number]) -------------------------------------------------------------------------------- Physician Orders Details Patient Name: Virginia Harold A. Date of Service: 04/21/2018 1:15 PM Medical Record Number: [id number] Patient Account Number: [id number] Date of Birth/Sex: 03-May-1936 (82 y.o. F) Treating RN: Cornell Barman Primary Care Provider: Sherrie Mustache Other Clinician: Referring Provider: Sherrie Mustache Treating Provider/Extender: Tito Dine in Treatment: 6 Verbal / Phone Orders: No Diagnosis Coding Wound Cleansing Wound #1 Right,Posterior Lower Leg o Clean wound with Normal Saline. Anesthetic (add to Medication List) Wound #1 Right,Posterior Lower Leg o Other: - PainEase Spray Primary Wound Dressing Wound #1 Right,Posterior Lower Leg o Santyl Ointment o Plain packing gauze Secondary Dressing Wound #1 Right,Posterior Lower Leg o Dry Gauze o Boardered Foam Dressing Dressing Change  Frequency Wound #1 Right,Posterior Lower Leg o Change dressing every day. Follow-up Appointments Wound #1 Right,Posterior Lower Leg o Return Appointment in 1 week. Electronic Signature(s) Signed: 04/21/2018 5:10:30 PM By: Linton Ham MD Signed: 04/21/2018 5:19:09 PM By: Gretta Cool, BSN, RN, CWS, Kim RN, BSN Entered By: Gretta Cool, BSN, RN, CWS, Kim on 04/21/2018  14:21:28 SHAWNTRICE, SALLE ([id number]) -------------------------------------------------------------------------------- Problem List Details Patient Name: Virginia [name], Virginia A. Date of Service: 04/21/2018 1:15 PM Medical Record Number: [id number] Patient Account Number: [id number] Date of Birth/Sex: July 03, 1936 (82 y.o. 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 Date of Service: 04/21/2018 1:15 PM Medical Record Number: [id number] Patient Account Number: [id number] Date of Birth/Sex: [birth year] (82 y.o. F) Treating RN: Cornell Barman Primary Care Provider: Sherrie Mustache Other Clinician: Referring Provider: Sherrie Mustache Treating Provider/Extender: Tito Dine in Treatment: 6 Subjective History of Present Illness (HPI) 03/10/18; is an 82 year old woman who is followed at [facility]. She is here for an open wound on the right posterior leg. Apparently this started in February with a fall. She was seen in the ER on 01/16/18 and the area was sutured. At some point the sutures were removed she was seen in her primary office on 02/09/18 at which time the wound had opened. It was felt to have cellulitis with redness drainage and tenderness and she was given doxycycline and Santyl was prescribed. they have been using Santyl and changing it daily. The patient has some form of dementia and I think is cared for by her brother. It is her daughter who brought her today. They tell us that she is sometimes reluctant to get out of bed or reluctant to go to appointments. patient is not a diabetic. ABIs in this clinic were 0.95 on the right The patient has dementia, congestive heart failure, coronary artery disease, hypertension and COPD. She apparently falls fairly frequently. The reason for this is not completely clear 03/17/18; small but punched out wound on the right posterior calf 03/31/18; small but punched out wound on the right posterior calf. We've been using Santyl. This is initially felt to be traumatic on a bed frame although nobody is completely certain about that they simply noticed blood on the bed. 04/02/18 on evaluation today patient appears to be doing a little bit worse when she was seen two days ago. Fortunately she does not seem to have any evidence of severe infection although she does have some erythema surrounding the wound bed. Unfortunately she's also had increased discomfort and pain secondary to what her brother and sister feel is likely an infection. They state that she normally has a fairly high pain tolerance and has been really complaining of pain today. Patient does have dementia. 04/07/18; patient was in with erythema around the wound on 04/02/18. Culture that was done last time showed methicillin sensitive staph aureus which should've been covered by the  trimethoprim/sulfamethoxazole we gave her. 04/14/18; the area on the right posterior calf which was initially a traumatic wound that was sutured but then the test. She is completing her antibiotics today. We've been using Santyl. Dimensions of the wound are smaller there is still some probing depth although very small wound. Somewhat firm around the wound which I think is probably scar tissue. She went for arterial studies. She was not able to  tolerate ABIs. On the right she did have biphasic wave forms for the most part there was a 50-74% stenosis in the above-knee popliteal and a 30-49% stenosis in the common femoral artery. She has 2 vessel runoff E of the PTA and ATA. Peroneal artery was not visualized. The patient is not very ambulatory unsteady on her feet but I'm really not certain any of this represents claudication. 04/21/18; the patient has a small punched out area on the posterior calf. Covered in liquid slough nonviable subcutaneous tissue. This covered the tiny wound bed that is all reminiscent of this initial traumatic wound.we have been using Santyl Objective Constitutional Patient is hypertensive.. Pulse regular and within target range for patient.Marland Kitchen Respirations regular, non-labored and within target range.. Temperature is normal and within the target range for the patient.Marland Kitchen appears in no distress. Virginia [name], Virginia A. ([id number]) Vitals Time Taken: 1:31 PM, Height: 63 in, Weight: 196 lbs, BMI: 34.7, Temperature: 98.5 F, Pulse: 69 bpm, Respiratory Rate: 16 breaths/min, Blood Pressure: 148/64 mmHg. General Notes: wound exam; the patient's small wound had surface slough and nonviable subcutaneous tissue all of which I removed with the tip of a #15 blade. Hemostasis with direct pressure. Post debridement the base of this looks healthy there is no surrounding erythema. She has surrounding scar tissue presumably from trauma and suturing. Integumentary (Hair, Skin) Wound #1 status is Open.  Original cause of wound was Trauma. The wound is located on the Right,Posterior Lower Leg. The wound measures 0.3cm length x 0.4cm width x 0.2cm depth; 0.094cm^2 area and 0.019cm^3 volume. There is Fat Layer (Subcutaneous Tissue) Exposed exposed. There is no tunneling or undermining noted. There is a medium amount of serosanguineous drainage noted. The wound margin is flat and intact. There is medium (34-66%) pink granulation within the wound bed. There is a medium (34-66%) amount of necrotic tissue within the wound bed including Adherent Slough. The periwound skin appearance exhibited: Maceration, Ecchymosis. The periwound skin appearance did not exhibit: Callus, Crepitus, Excoriation, Induration, Rash, Scarring, Dry/Scaly, Atrophie Blanche, Cyanosis, Hemosiderin Staining, Mottled, Pallor, Rubor, Erythema. Periwound temperature was noted as No Abnormality. The periwound has tenderness on palpation. Assessment Active Problems ICD-10 L97.213 - 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scle S81.811D - Laceration without foreign body, right lower leg, subsequent encounter Procedures Wound #1 Pre-procedure diagnosis of Wound #1 is a Venous Leg Ulcer located on the Right,Posterior Lower Leg .Severity of Tissue Pre Debridement is: Fat layer exposed. There was a Excisional Skin/Subcutaneous Tissue Debridement with a total area of 0.12 sq cm performed by Ricard Dillon, MD. With the following instrument(s): Curette to remove Non-Viable tissue/material. Material removed includes Subcutaneous Tissue and Slough and after achieving pain control using Other (pain ease). A time out was conducted at 14:15, prior to the start of the procedure. There was no bleeding. The procedure was tolerated well with a pain level of 3 throughout and a pain level of 1 following the procedure. Patient s Level of Consciousness post procedure was recorded as Awake and Alert and post-procedure vitals were  taken including Temperature: 98.5 F, Pulse: 69 bpm, Respiratory Rate: 16 breaths/min, Blood Pressure: (148)/(64) mmHg. Post Debridement Measurements: 0.3cm length x 0.4cm width x 0.2cm depth; 0.019cm^3 volume. Character of Wound/Ulcer Post Debridement is stable. Severity of Tissue Post Debridement is: Fat layer exposed. Post procedure Diagnosis Wound #1: Same as Pre-Procedure Plan Virginia [name], Virginia A. ([id number]) Wound Cleansing: Wound #1 Right,Posterior Lower Leg: Clean wound with Normal Saline. Anesthetic (add to Medication  List): Wound #1 Right,Posterior Lower Leg: Other: - PainEase Spray Primary Wound Dressing: Wound #1 Right,Posterior Lower Leg: Santyl Ointment Plain packing gauze Secondary Dressing: Wound #1 Right,Posterior Lower Leg: Dry Gauze Boardered Foam Dressing Dressing Change Frequency: Wound #1 Right,Posterior Lower Leg: Change dressing every day. Follow-up Appointments: Wound #1 Right,Posterior Lower Leg: Return Appointment in 1 week. #1 I'm continuing with Santyl in the border foam dressing #2 could consider compression. #3 with a careful debridement I am hopeful getting a healthy-looking wound that will allow this to close over. Only a very tiny open area remains Electronic Signature(s) Signed: 04/21/2018 5:10:30 PM By: Linton Ham MD Entered By: Linton Ham on 04/21/2018 15:11:51 [name] ([id number]) -------------------------------------------------------------------------------- SuperBill Details Patient Name: Virginia Harold A. Date of Service: 04/21/2018 Medical Record Number: [id number] Patient Account Number: [id number] Date of Birth/Sex: June 30, 1936 (82 y.o. F) Treating RN: Cornell Barman Primary Care Provider: Sherrie Mustache Other Clinician: Referring Provider: Sherrie Mustache Treating Provider/Extender: Tito Dine in Treatment: 6 Diagnosis Coding ICD-10 Codes Code Description [phone] Non-pressure chronic ulcer of right calf  with necrosis of muscle S81.811D Laceration without foreign body, right lower leg, subsequent encounter Facility Procedures CPT4 Code: [id number] Description: 23343 - DEB SUBQ TISSUE 20 SQ CM/< ICD-10 Diagnosis Description L97.213 Non-pressure chronic ulcer of right calf with necrosis of m S81.811D Laceration without foreign body, right lower leg, subsequen Modifier: uscle t encounter Quantity: 1 Physician Procedures CPT4 Code: [id number] Description: 37290 - WC PHYS SUBQ TISS 20 SQ CM ICD-10 Diagnosis Description S11.155 Non-pressure chronic ulcer of right calf with necrosis of m S81.811D Laceration without foreign body, right lower leg, subsequen Modifier: uscle t encounter Quantity: 1 Electronic Signature(s) Signed: 04/21/2018 5:10:30 PM By: Linton Ham MD Entered By: Linton Ham on 04/21/2018 15:12:14

## 2018-04-27 ENCOUNTER — Telehealth: Payer: Self-pay | Admitting: Nurse Practitioner

## 2018-04-27 ENCOUNTER — Other Ambulatory Visit: Payer: Self-pay | Admitting: Internal Medicine

## 2018-04-27 NOTE — Telephone Encounter (Signed)
Left message for patient's brother to call me at [phone] about community resource referral. VDM (DD)

## 2018-04-28 ENCOUNTER — Encounter: Payer: Medicare Other | Admitting: Internal Medicine

## 2018-04-28 DIAGNOSIS — I110 Hypertensive heart disease with heart failure: Secondary | ICD-10-CM | POA: Diagnosis not present

## 2018-04-28 DIAGNOSIS — J449 Chronic obstructive pulmonary disease, unspecified: Secondary | ICD-10-CM | POA: Diagnosis not present

## 2018-04-28 DIAGNOSIS — F0390 Unspecified dementia without behavioral disturbance: Secondary | ICD-10-CM | POA: Diagnosis not present

## 2018-04-28 DIAGNOSIS — L97312 Non-pressure chronic ulcer of right ankle with fat layer exposed: Secondary | ICD-10-CM | POA: Diagnosis not present

## 2018-04-28 DIAGNOSIS — S81801A Unspecified open wound, right lower leg, initial encounter: Secondary | ICD-10-CM | POA: Diagnosis not present

## 2018-04-28 DIAGNOSIS — I2510 Atherosclerotic heart disease of native coronary artery without angina pectoris: Secondary | ICD-10-CM | POA: Diagnosis not present

## 2018-04-28 DIAGNOSIS — I509 Heart failure, unspecified: Secondary | ICD-10-CM | POA: Diagnosis not present

## 2018-04-28 NOTE — Progress Notes (Signed)
ASYRIA, KOLANDER ([id number]) Visit Report for 04/02/2018 Arrival Information Details Patient Name: Virginia, [name]. Date of Service: 04/02/2018 2:45 PM Medical Record Number: [id number] Patient Account Number: [id number] Date of Birth/Sex: 04-Dec-1935 (82 y.o. F) Treating RN: Montey Hora Primary Care Berneice Zettlemoyer: Sherrie Mustache Other Clinician: Referring Hilaria Titsworth: Sherrie Mustache Treating Jaylynne Birkhead/Extender: Melburn Hake, HOYT Weeks in Treatment: 3 Visit Information History Since Last Visit Added or deleted any medications: No Patient Arrived: Wheel Chair Any new allergies or adverse reactions: No Arrival Time: 15:05 Had a fall or experienced change in No Accompanied By: sister, activities of daily living that may affect brother risk of falls: Transfer Assistance: Other Signs or symptoms of abuse/neglect since last visito No Patient Requires Transmission-Based No Hospitalized since last visit: No Precautions: Implantable device outside of the clinic excluding No Patient Has Alerts: No cellular tissue based products placed in the center since last visit: Pain Present Now: Yes Electronic Signature(s) Signed: 04/28/2018 7:45:54 AM By: Harold Barban Entered By: Harold Barban on 04/02/2018 15:08:35 Virginia [name] ([id number]) -------------------------------------------------------------------------------- Clinic Level of Care Assessment Details Patient Name: Virginia Harold A. Date of Service: 04/02/2018 2:45 PM Medical Record Number: [id number] Patient Account Number: [id number] Date of Birth/Sex: 1936-06-24 (82 y.o. F) Treating RN: Montey Hora Primary Care Saahir Prude: Sherrie Mustache Other Clinician: Referring Kadiatou Oplinger: Sherrie Mustache Treating Janey Petron/Extender: Melburn Hake, HOYT Weeks in Treatment: 3 Clinic Level of Care Assessment Items TOOL 4 Quantity Score []  - Use when only an EandM is performed on FOLLOW-UP visit 0 ASSESSMENTS - Nursing Assessment / Reassessment X -  Reassessment of Co-morbidities (includes updates in patient status) 1 10 X- 1 5 Reassessment of Adherence to Treatment Plan ASSESSMENTS - Wound and Skin Assessment / Reassessment X - Simple Wound Assessment / Reassessment - one wound 1 5 []  - 0 Complex Wound Assessment / Reassessment - multiple wounds []  - 0 Dermatologic / Skin Assessment (not related to wound area) ASSESSMENTS - Focused Assessment []  - Circumferential Edema Measurements - multi extremities 0 []  - 0 Nutritional Assessment / Counseling / Intervention X- 1 5 Lower Extremity Assessment (monofilament, tuning fork, pulses) []  - 0 Peripheral Arterial Disease Assessment (using hand held doppler) ASSESSMENTS - Ostomy and/or Continence Assessment and Care []  - Incontinence Assessment and Management 0 []  - 0 Ostomy Care Assessment and Management (repouching, etc.) PROCESS - Coordination of Care X - Simple Patient / Family Education for ongoing care 1 15 []  - 0 Complex (extensive) Patient / Family Education for ongoing care []  - 0 Staff obtains Programmer, systems, Records, Test Results / Process Orders []  - 0 Staff telephones HHA, Nursing Homes / Clarify orders / etc []  - 0 Routine Transfer to another Facility (non-emergent condition) []  - 0 Routine Hospital Admission (non-emergent condition) []  - 0 New Admissions / Biomedical engineer / Ordering NPWT, Apligraf, etc. []  - 0 Emergency Hospital Admission (emergent condition) X- 1 10 Simple Discharge Coordination TEMIMA, KUTSCH A. ([id number]) []  - 0 Complex (extensive) Discharge Coordination PROCESS - Special Needs []  - Pediatric / Minor Patient Management 0 []  - 0 Isolation Patient Management []  - 0 Hearing / Language / Visual special needs []  - 0 Assessment of Community assistance (transportation, D/C planning, etc.) []  - 0 Additional assistance / Altered mentation []  - 0 Support Surface(s) Assessment (bed, cushion, seat, etc.) INTERVENTIONS - Wound Cleansing /  Measurement X - Simple Wound Cleansing - one wound 1 5 []  - 0 Complex Wound Cleansing - multiple wounds X- 1 5 Wound Imaging (photographs - any number of  wounds) []  - 0 Wound Tracing (instead of photographs) X- 1 5 Simple Wound Measurement - one wound []  - 0 Complex Wound Measurement - multiple wounds INTERVENTIONS - Wound Dressings X - Small Wound Dressing one or multiple wounds 1 10 []  - 0 Medium Wound Dressing one or multiple wounds []  - 0 Large Wound Dressing one or multiple wounds []  - 0 Application of Medications - topical []  - 0 Application of Medications - injection INTERVENTIONS - Miscellaneous []  - External ear exam 0 X- 1 5 Specimen Collection (cultures, biopsies, blood, body fluids, etc.) X- 1 5 Specimen(s) / Culture(s) sent or taken to Lab for analysis []  - 0 Patient Transfer (multiple staff / Civil Service fast streamer / Similar devices) []  - 0 Simple Staple / Suture removal (25 or less) []  - 0 Complex Staple / Suture removal (26 or more) []  - 0 Hypo / Hyperglycemic Management (close monitor of Blood Glucose) []  - 0 Ankle / Brachial Index (ABI) - do not check if billed separately X- 1 5 Vital Signs Housey, Teniola A. ([id number]) Has the patient been seen at the hospital within the last three years: Yes Total Score: 90 Level Of Care: New/Established - Level 3 Electronic Signature(s) Signed: 04/02/2018 4:48:08 PM By: Montey Hora Entered By: Montey Hora on 04/02/2018 15:54:29 Virginia [name] ([id number]) -------------------------------------------------------------------------------- Encounter Discharge Information Details Patient Name: Virginia Harold A. Date of Service: 04/02/2018 2:45 PM Medical Record Number: [id number] Patient Account Number: [id number] Date of Birth/Sex: 1936/05/27 (82 y.o. F) Treating RN: Montey Hora Primary Care Shalin Linders: Sherrie Mustache Other Clinician: Referring Larry Alcock: Sherrie Mustache Treating Sybol Morre/Extender: Melburn Hake, HOYT Weeks in  Treatment: 3 Encounter Discharge Information Items Discharge Pain Level: 0 Discharge Condition: Stable Ambulatory Status: Wheelchair Discharge Destination: Home Transportation: Private Auto Accompanied By: family Schedule Follow-up Appointment: Yes Medication Reconciliation completed and No provided to Patient/Care Gracelin Weisberg: Provided on Clinical Summary of Care: 04/02/2018 Form Type Recipient Paper Patient HI Electronic Signature(s) Signed: 04/02/2018 3:55:08 PM By: Alric Quan Entered By: Alric Quan on 04/02/2018 15:55:08 Virginia [name] ([id number]) -------------------------------------------------------------------------------- Lower Extremity Assessment Details Patient Name: Virginia Harold A. Date of Service: 04/02/2018 2:45 PM Medical Record Number: [id number] Patient Account Number: [id number] Date of Birth/Sex: Apr 27, 1936 (82 y.o. F) Treating RN: Montey Hora Primary Care Jeovani Weisenburger: Sherrie Mustache Other Clinician: Referring Nawal Burling: Sherrie Mustache Treating Vianney Kopecky/Extender: Melburn Hake, HOYT Weeks in Treatment: 3 Edema Assessment Assessed: [Left: No] [Right: No] Edema: [Left: Ye] [Right: s] Vascular Assessment Pulses: Dorsalis Pedis Palpable: [Right:Yes] Posterior Tibial Palpable: [Right:No] Popliteal Palpable: [Right:No] Extremity colors, hair growth, and conditions: Hair Growth on Extremity: [Right:No] Temperature of Extremity: [Right:Cool] Capillary Refill: [Right:< 3 seconds] Electronic Signature(s) Signed: 04/02/2018 4:48:08 PM By: Montey Hora Signed: 04/28/2018 7:45:54 AM By: Harold Barban Entered By: Harold Barban on 04/02/2018 15:16:58 Virginia [name] ([id number]) -------------------------------------------------------------------------------- Multi Wound Chart Details Patient Name: Virginia Harold A. Date of Service: 04/02/2018 2:45 PM Medical Record Number: [id number] Patient Account Number: [id number] Date of Birth/Sex: 1936/05/01 (82 y.o.  F) Treating RN: Montey Hora Primary Care Aurielle Slingerland: Sherrie Mustache Other Clinician: Referring Raynald Rouillard: Sherrie Mustache Treating Xylah Early/Extender: Melburn Hake, HOYT Weeks in Treatment: 3 Vital Signs Height(in): 81 Pulse(bpm): 76 Weight(lbs): 196 Blood Pressure(mmHg): 108/51 Body Mass Index(BMI): 35 Temperature(F): 98.5 Respiratory Rate 16 (breaths/min): Photos: [1:No Photos] [N/A:N/A] Wound Location: [1:Right Lower Leg - Posterior] [N/A:N/A] Wounding Event: [1:Trauma] [N/A:N/A] Primary Etiology: [1:Venous Leg Ulcer] [N/A:N/A] Comorbid History: [1:Cataracts, Chronic Obstructive N/A Pulmonary Disease (COPD), Congestive Heart Failure, Coronary Artery Disease, Hypertension, Osteoarthritis] Date Acquired: [1:01/30/2018] [N/A:N/A] Weeks  of Treatment: [1:3] [N/A:N/A] Wound Status: [1:Open] [N/A:N/A] Measurements L x W x D [1:0.5x1x0.6] [N/A:N/A] (cm) Area (cm) : [1:0.393] [N/A:N/A] Volume (cm) : [1:0.236] [N/A:N/A] % Reduction in Area: [1:65.00%] [N/A:N/A] % Reduction in Volume: [1:-4.90%] [N/A:N/A] Classification: [1:Full Thickness Without Exposed Support Structures] [N/A:N/A] Exudate Amount: [1:Large] [N/A:N/A] Exudate Type: [1:Serosanguineous] [N/A:N/A] Exudate Color: [1:red, brown] [N/A:N/A] Wound Margin: [1:Flat and Intact] [N/A:N/A] Granulation Amount: [1:None Present (0%)] [N/A:N/A] Necrotic Amount: [1:Large (67-100%)] [N/A:N/A] Exposed Structures: [1:Fat Layer (Subcutaneous Tissue) Exposed: Yes Fascia: No Tendon: No Muscle: No Joint: No Bone: No] [N/A:N/A] Epithelialization: [1:None] [N/A:N/A] Periwound Skin Texture: [1:Excoriation: No Induration: No Callus: No] [N/A:N/A] Crepitus: No Rash: No Scarring: No Periwound Skin Moisture: Maceration: Yes N/A N/A Dry/Scaly: No Periwound Skin Color: Ecchymosis: Yes N/A N/A Atrophie Blanche: No Cyanosis: No Erythema: No Hemosiderin Staining: No Mottled: No Pallor: No Rubor: No Temperature: No Abnormality N/A  N/A Tenderness on Palpation: Yes N/A N/A Wound Preparation: Ulcer Cleansing: N/A N/A Rinsed/Irrigated with Saline Topical Anesthetic Applied: None Treatment Notes Electronic Signature(s) Signed: 04/02/2018 4:48:08 PM By: Montey Hora Entered By: Montey Hora on 04/02/2018 15:38:27 Virginia [name] ([id number]) -------------------------------------------------------------------------------- Multi-Disciplinary Care Plan Details Patient Name: Virginia Harold A. Date of Service: 04/02/2018 2:45 PM Medical Record Number: [id number] Patient Account Number: [id number] Date of Birth/Sex: March 10, 1936 (82 y.o. F) Treating RN: Montey Hora Primary Care Gordana Kewley: Sherrie Mustache Other Clinician: Referring Urvi Imes: Sherrie Mustache Treating Dimitrios Balestrieri/Extender: Melburn Hake, HOYT Weeks in Treatment: 3 Active Inactive ` Abuse / Safety / Falls / Self Care Management Nursing Diagnoses: History of Falls Potential for falls Goals: Patient will not experience any injury related to falls Date Initiated: 03/10/2018 Target Resolution Date: 04/09/2018 Goal Status: Active Interventions: Podiatry chair, stretcher in low position and side rails up as needed Assess fall risk on admission and as needed Notes: ` Orientation to the Wound Care Program Nursing Diagnoses: Knowledge deficit related to the wound healing center program Goals: Patient/caregiver will verbalize understanding of the [place] Date Initiated: 03/10/2018 Target Resolution Date: 04/09/2018 Goal Status: Active Interventions: Provide education on orientation to the wound center Notes: ` Soft Tissue Infection Nursing Diagnoses: Impaired tissue integrity Potential for infection: soft tissue Goals: Patient will remain free of wound infection Date Initiated: 03/10/2018 Target Resolution Date: 04/09/2018 DURA, MCCORMACK ([id number]) Goal Status: Active Interventions: Assess signs and symptoms of infection every  visit Notes: ` Wound/Skin Impairment Nursing Diagnoses: Impaired tissue integrity Goals: Ulcer/skin breakdown will have a volume reduction of 80% by week 12 Date Initiated: 03/10/2018 Target Resolution Date: 07/10/2018 Goal Status: Active Interventions: Provide education on ulcer and skin care Treatment Activities: Skin care regimen initiated : 03/10/2018 Topical wound management initiated : 03/10/2018 Notes: Electronic Signature(s) Signed: 04/02/2018 4:48:08 PM By: Montey Hora Entered By: Montey Hora on 04/02/2018 15:38:15 Virginia [name] ([id number]) -------------------------------------------------------------------------------- Pain Assessment Details Patient Name: Virginia Harold A. Date of Service: 04/02/2018 2:45 PM Medical Record Number: [id number] Patient Account Number: [id number] Date of Birth/Sex: 1936/07/16 (82 y.o. F) Treating RN: Montey Hora Primary Care Cortlandt Capuano: Sherrie Mustache Other Clinician: Referring Yaiza Palazzola: Sherrie Mustache Treating Shela Esses/Extender: Melburn Hake, HOYT Weeks in Treatment: 3 Active Problems Location of Pain Severity and Description of Pain Patient Has Paino Yes Site Locations Pain Location: Generalized Pain Pain Management and Medication Current Pain Management: Notes Patient has high tolerance for pain, stated by brother Electronic Signature(s) Signed: 04/02/2018 4:48:08 PM By: Montey Hora Signed: 04/28/2018 7:45:54 AM By: Harold Barban Entered By: Harold Barban on 04/02/2018 15:09:29 Virginia Harold A. ([id number]) --------------------------------------------------------------------------------  Patient/Caregiver Education Details Patient Name: Virginia [name], Virginia A. Date of Service: 04/02/2018 2:45 PM Medical Record Number: [id number] Patient Account Number: [id number] Date of Birth/Gender: 11/09/1936 (82 y.o. F) Treating RN: Ahmed Prima Primary Care Physician: Sherrie Mustache Other Clinician: Referring Physician: Sherrie Mustache Treating Physician/Extender: Melburn Hake, HOYT Weeks in Treatment: 3 Education Assessment Education Provided To: Patient Education Topics Provided Wound/Skin Impairment: Handouts: Caring for Your Ulcer, Other: change dressing as ordered Methods: Demonstration, Explain/Verbal Responses: State content correctly Electronic Signature(s) Signed: 04/02/2018 4:40:49 PM By: Alric Quan Entered By: Alric Quan on 04/02/2018 15:55:36 Virginia [name] ([id number]) -------------------------------------------------------------------------------- Wound Assessment Details Patient Name: Virginia Harold A. Date of Service: 04/02/2018 2:45 PM Medical Record Number: [id number] Patient Account Number: [id number] Date of Birth/Sex: [birth year] (82 y.o. F) Treating RN: Montey Hora Primary Care Keilynn Marano: Sherrie Mustache Other Clinician: Referring Hanah Moultry: Sherrie Mustache Treating Nathanel Tallman/Extender: Melburn Hake, HOYT Weeks in Treatment: 3 Wound Status Wound Number: 1 Primary Venous Leg Ulcer Etiology: Wound Location: Right Lower Leg - Posterior Wound Open Wounding Event: Trauma Status: Date Acquired: 01/30/2018 Comorbid Cataracts, Chronic Obstructive Pulmonary Weeks Of Treatment: 3 History: Disease (COPD), Congestive Heart Failure, Clustered Wound: No Coronary Artery Disease, Hypertension, Osteoarthritis Photos Photo Uploaded By: Harold Barban on 04/02/2018 16:08:44 Wound Measurements Length: (cm) 0.5 Width: (cm) 1 Depth: (cm) 0.6 Area: (cm) 0.393 Volume: (cm) 0.236 % Reduction in Area: 65% % Reduction in Volume: -4.9% Epithelialization: None Tunneling: No Undermining: No Wound Description Full Thickness Without Exposed Support Classification: Structures Wound Margin: Flat and Intact Exudate Large Amount: Exudate Type: Serosanguineous Exudate Color: red, brown Foul Odor After Cleansing: No Slough/Fibrino Yes Wound Bed Granulation Amount: None Present (0%)  Exposed Structure Necrotic Amount: Large (67-100%) Fascia Exposed: No Necrotic Quality: Adherent Slough Fat Layer (Subcutaneous Tissue) Exposed: Yes Tendon Exposed: No Muscle Exposed: No Joint Exposed: No Mclarty, Shaliah A. ([id number]) Bone Exposed: No Periwound Skin Texture Texture Color No Abnormalities Noted: No No Abnormalities Noted: No Callus: No Atrophie Blanche: No Crepitus: No Cyanosis: No Excoriation: No Ecchymosis: Yes Induration: No Erythema: No Rash: No Hemosiderin Staining: No Scarring: No Mottled: No Pallor: No Moisture Rubor: No No Abnormalities Noted: No Dry / Scaly: No Temperature / Pain Maceration: Yes Temperature: No Abnormality Tenderness on Palpation: Yes Wound Preparation Ulcer Cleansing: Rinsed/Irrigated with Saline Topical Anesthetic Applied: None Electronic Signature(s) Signed: 04/02/2018 4:48:08 PM By: Montey Hora Signed: 04/28/2018 7:45:54 AM By: Harold Barban Entered By: Harold Barban on 04/02/2018 15:16:01 Virginia [name] ([id number]) -------------------------------------------------------------------------------- Vitals Details Patient Name: Virginia Harold A. Date of Service: 04/02/2018 2:45 PM Medical Record Number: [id number] Patient Account Number: [id number] Date of Birth/Sex: September 06, 1936 (82 y.o. F) Treating RN: Montey Hora Primary Care Masaye Gatchalian: Sherrie Mustache Other Clinician: Referring Matea Stanard: Sherrie Mustache Treating Braxtyn Dorff/Extender: Melburn Hake, HOYT Weeks in Treatment: 3 Vital Signs Time Taken: 15:09 Temperature (F): 98.5 Height (in): 63 Pulse (bpm): 63 Weight (lbs): 196 Respiratory Rate (breaths/min): 16 Body Mass Index (BMI): 34.7 Blood Pressure (mmHg): 108/51 Reference Range: 80 - 120 mg / dl Electronic Signature(s) Signed: 04/28/2018 7:45:54 AM By: Harold Barban Entered By: Harold Barban on 04/02/2018 15:10:02

## 2018-04-28 NOTE — Progress Notes (Signed)
KRISTIA, JUPITER ([id number]) Visit Report for 04/07/2018 Arrival Information Details Patient Name: Virginia [name], Virginia [name]. Date of Service: 04/07/2018 2:15 PM Medical Record Number: [id number] Patient Account Number: [id number] Date of Birth/Sex: [birth year] (81 y.o. F) Treating [name]: Virginia [name] Primary Care Virginia [name]: Virginia [name] Other Clinician: Referring Virginia [name]: Virginia [name] Treating Virginia [name]/Extender: Virginia [name] in Treatment: 4 Visit Information History Since Last Visit Added or deleted any medications: No Patient Arrived: Wheel Chair Any new allergies or adverse reactions: No Arrival Time: 14:21 Had [name] fall or experienced change in No Accompanied By: son activities of daily living that may affect Transfer Assistance: EasyPivot Patient risk of falls: Lift Signs or symptoms of abuse/neglect since last visito No Patient Identification Verified: Yes Hospitalized since last visit: No Secondary Verification Process Yes Implantable device outside of the clinic excluding No Completed: cellular tissue based products placed in the center Patient Requires Transmission-Based No since last visit: Precautions: Pain Present Now: No Patient Has Alerts: No Electronic Signature(s) Signed: 04/28/2018 7:45:54 AM By: Virginia [name] Entered By: Virginia [name] on 04/07/2018 14:22:35 Virginia [name] ([id number]) -------------------------------------------------------------------------------- Encounter Discharge Information Details Patient Name: Virginia Virginia [name]. Date of Service: 04/07/2018 2:15 PM Medical Record Number: [id number] Patient Account Number: [id number] Date of Birth/Sex: 05/30/1936 (82 y.o. F) Treating [name]: Virginia [name] Primary Care Araseli Sherry: Virginia [name] Other Clinician: Referring Lynkin Saini: Virginia [name] Treating Virginia [name]/Extender: Virginia [name] in Treatment: 4 Encounter Discharge Information Items Discharge Condition: Stable Ambulatory Status:  Wheelchair Discharge Destination: Home Transportation: Private Auto Accompanied By: brother Schedule Follow-up Appointment: Yes Clinical Summary of Care: Electronic Signature(s) Signed: 04/28/2018 7:45:54 AM By: Virginia [name] Entered By: Virginia [name] on 04/07/2018 15:17:26 Virginia [name] ([id number]) -------------------------------------------------------------------------------- Lower Extremity Assessment Details Patient Name: Virginia Virginia [name]. Date of Service: 04/07/2018 2:15 PM Medical Record Number: [id number] Patient Account Number: [id number] Date of Birth/Sex: 01/23/1936 (82 y.o. F) Treating [name]: Virginia [name] Primary Care Deagen Krass: Virginia [name] Other Clinician: Referring Charlina Dwight: Virginia [name] Treating Virginia [name]/Extender: Virginia [name] in Treatment: 4 Edema Assessment Assessed: [Left: No] [Right: No] Edema: [Left: Ye] [Right: s] Vascular Assessment Claudication: Claudication Assessment [Right:None] Pulses: Dorsalis Pedis Palpable: [Right:Yes] Posterior Tibial Palpable: [Right:Yes] Extremity colors, hair growth, and conditions: Hair Growth on Extremity: [Right:Yes] Temperature of Extremity: [Right:Warm] Capillary Refill: [Right:< 3 seconds] Dependent Rubor: [Right:No] Blanched when Elevated: [Right:No] Lipodermatosclerosis: [Right:No] Electronic Signature(s) Signed: 04/07/2018 5:47:31 PM By: Virginia [name], BSN, [name], CWS, Virginia [name], BSN Signed: 04/28/2018 7:45:54 AM By: Virginia [name] Entered By: Virginia [name] on 04/07/2018 14:31:57 Virginia [name] ([id number]) -------------------------------------------------------------------------------- Multi Wound Chart Details Patient Name: Virginia Virginia [name]. Date of Service: 04/07/2018 2:15 PM Medical Record Number: [id number] Patient Account Number: [id number] Date of Birth/Sex: [birth year] (82 y.o. F) Treating [name]: Virginia [name] Primary Care Landi Biscardi: Virginia [name] Other Clinician: Referring Chyan Carnero: Virginia [name] Treating  Virginia [name]/Extender: Virginia [name] in Treatment: 4 Vital Signs Height(in): 63 Pulse(bpm): 30 Weight(lbs): 196 Blood Pressure(mmHg): 113/53 Body Mass Index(BMI): 35 Temperature(F): 98.2 Respiratory Rate 16 (breaths/min): Photos: [N/[name]:N/[name]] Wound Location: Right Lower Leg - Posterior N/[name] N/[name] Wounding Event: Trauma N/[name] N/[name] Primary Etiology: Venous Leg Ulcer N/[name] N/[name] Comorbid History: Cataracts, Chronic Obstructive N/[name] N/[name] Pulmonary Disease (COPD), Congestive Heart Failure, Coronary Artery Disease, Hypertension, Osteoarthritis Date Acquired: 01/30/2018 N/[name] N/[name] Weeks of Treatment: 4 N/[name] N/[name] Wound Status: Open N/[name] N/[name] Measurements L x W x D 0.7x1.3x0.3 N/[name] N/[name] (cm) Area (cm) : 0.715 N/[name] N/[name] Volume (cm) : 0.214 N/[name] N/[name] % Reduction in Area: 36.30% N/[name] N/[name] % Reduction in  Volume: 4.90% N/[name] N/[name] Classification: Full Thickness Without N/[name] N/[name] Exposed Support Structures Exudate Amount: Small N/[name] N/[name] Exudate Type: Serosanguineous N/[name] N/[name] Exudate Color: red, brown N/[name] N/[name] Wound Margin: Flat and Intact N/[name] N/[name] Granulation Amount: None Present (0%) N/[name] N/[name] Necrotic Amount: Large (67-100%) N/[name] N/[name] Exposed Structures: Fat Layer (Subcutaneous N/[name] N/[name] Tissue) Exposed: Yes Fascia: No Tendon: No Virginia [name]. ([id number]) Muscle: No Joint: No Bone: No Epithelialization: None N/[name] N/[name] Debridement: Debridement - Excisional N/[name] N/[name] Pre-procedure 14:58 N/[name] N/[name] Verification/Time Out Taken: Pain Control: Other N/[name] N/[name] Tissue Debrided: Subcutaneous, Slough N/[name] N/[name] Level: Skin/Subcutaneous Tissue N/[name] N/[name] Debridement Area (sq cm): 0.91 N/[name] N/[name] Instrument: Curette N/[name] N/[name] Bleeding: Minimum N/[name] N/[name] Hemostasis Achieved: Pressure N/[name] N/[name] Procedural Pain: 2 N/[name] N/[name] Post Procedural Pain: 0 N/[name] N/[name] Debridement Treatment Procedure was tolerated well N/[name] N/[name] Response: Post Debridement 0.7x1.3x0.4 N/[name] N/[name] Measurements L x W x D (cm) Post Debridement Volume: 0.286 N/[name]  N/[name] (cm) Periwound Skin Texture: Excoriation: No N/[name] N/[name] Induration: No Callus: No Crepitus: No Rash: No Scarring: No Periwound Skin Moisture: Maceration: Yes N/[name] N/[name] Dry/Scaly: No Periwound Skin Color: Ecchymosis: Yes N/[name] N/[name] Atrophie Blanche: No Cyanosis: No Erythema: No Hemosiderin Staining: No Mottled: No Pallor: No Rubor: No Temperature: No Abnormality N/[name] N/[name] Tenderness on Palpation: Yes N/[name] N/[name] Wound Preparation: Ulcer Cleansing: N/[name] N/[name] Rinsed/Irrigated with Saline Topical Anesthetic Applied: None, Other: spray benzocaine Procedures Performed: Debridement N/[name] N/[name] Treatment Notes Wound #1 (Right, Posterior Lower Leg) 1. Cleansed with: Clean wound with Normal Saline 2. Anesthetic Other (specify in notes) 4. Dressing Applied: Santyl Ointment Virginia [name], Virginia [name]. ([id number]) 5. Secondary Dressing Applied Bordered Foam Dressing Notes Packing strips Electronic Signature(s) Signed: 04/08/2018 1:16:01 PM By: Linton Ham MD Entered By: Linton Ham on 04/07/2018 16:39:42 Virginia [name] ([id number]) -------------------------------------------------------------------------------- Multi-Disciplinary Care Plan Details Patient Name: Virginia Virginia [name]. Date of Service: 04/07/2018 2:15 PM Medical Record Number: [id number] Patient Account Number: [id number] Date of Birth/Sex: Mar 08, 1936 (82 y.o. F) Treating [name]: Virginia [name] Primary Care Tedi Hughson: Virginia [name] Other Clinician: Referring Estefano Victory: Virginia [name] Treating Mitchel Delduca/Extender: Virginia [name] in Treatment: 4 Active Inactive ` Abuse / Safety / Falls / Self Care Management Nursing Diagnoses: History of Falls Potential for falls Goals: Patient will not experience any injury related to falls Date Initiated: 03/10/2018 Target Resolution Date: 04/09/2018 Goal Status: Active Interventions: Podiatry chair, stretcher in low position and side rails up as needed Assess fall risk on admission and as  needed Notes: ` Orientation to the Wound Care Program Nursing Diagnoses: Knowledge deficit related to the wound healing center program Goals: Patient/caregiver will verbalize understanding of the [place] Date Initiated: 03/10/2018 Target Resolution Date: 04/09/2018 Goal Status: Active Interventions: Provide education on orientation to the wound center Notes: ` Soft Tissue Infection Nursing Diagnoses: Impaired tissue integrity Potential for infection: soft tissue Goals: Patient will remain free of wound infection Date Initiated: 03/10/2018 Target Resolution Date: 04/09/2018 Virginia [name], Virginia [name] ([id number]) Goal Status: Active Interventions: Assess signs and symptoms of infection every visit Notes: ` Wound/Skin Impairment Nursing Diagnoses: Impaired tissue integrity Goals: Ulcer/skin breakdown will have [name] volume reduction of 80% by week 12 Date Initiated: 03/10/2018 Target Resolution Date: 07/10/2018 Goal Status: Active Interventions: Provide education on ulcer and skin care Treatment Activities: Skin care regimen initiated : 03/10/2018 Topical wound management initiated : 03/10/2018 Notes: Electronic Signature(s) Signed: 04/07/2018 5:47:31 PM By: Virginia [name], BSN, [name], CWS, Virginia [name], BSN Entered By: Virginia [name], BSN, [name], CWS, Virginia on 04/07/2018 14:57:22  Virginia [name], Virginia AMarland Kitchen ([id number]) -------------------------------------------------------------------------------- Pain Assessment Details Patient Name: Virginia [name], Virginia [name]. Date of Service: 04/07/2018 2:15 PM Medical Record Number: [id number] Patient Account Number: [id number] Date of Birth/Sex: [birth year] (82 y.o. F) Treating [name]: Virginia [name] Primary Care Stuart Mirabile: Virginia [name] Other Clinician: Referring Traeger Sultana: Virginia [name] Treating Zaley Talley/Extender: Virginia [name] in Treatment: 4 Active Problems Location of Pain Severity and Description of Pain Patient Has Paino No Site Locations Pain Management and  Medication Current Pain Management: Electronic Signature(s) Signed: 04/07/2018 5:47:31 PM By: Virginia [name], BSN, [name], CWS, Virginia [name], BSN Signed: 04/28/2018 7:45:54 AM By: Virginia [name] Entered By: Virginia [name] on 04/07/2018 14:23:22 Virginia [name] ([id number]) -------------------------------------------------------------------------------- Patient/Caregiver Education Details Patient Name: Virginia Virginia [name]. Date of Service: 04/07/2018 2:15 PM Medical Record Number: [id number] Patient Account Number: [id number] Date of Birth/Gender: 30-Nov-1936 (82 y.o. F) Treating [name]: Virginia [name] Primary Care Physician: Virginia [name] Other Clinician: Referring Physician: Sherrie [name] Treating Physician/Extender: Virginia [name] in Treatment: 4 Education Assessment Education Provided To: Patient brother Education Topics Provided Wound/Skin Impairment: Handouts: Caring for Your Ulcer Methods: Explain/Verbal Responses: State content correctly Electronic Signature(s) Signed: 04/28/2018 7:45:54 AM By: Virginia [name] Entered By: Virginia [name] on 04/07/2018 15:17:51 Virginia [name] ([id number]) -------------------------------------------------------------------------------- Wound Assessment Details Patient Name: Virginia Virginia [name]. Date of Service: 04/07/2018 2:15 PM Medical Record Number: [id number] Patient Account Number: [id number] Date of Birth/Sex: 1936/11/02 (82 y.o. F) Treating [name]: Virginia [name] Primary Care Jasia Hiltunen: Virginia [name] Other Clinician: Referring Layza Summa: Virginia [name] Treating Berthold Glace/Extender: Virginia [name] in Treatment: 4 Wound Status Wound Number: 1 Primary Venous Leg Ulcer Etiology: Wound Location: Right Lower Leg - Posterior Wound Open Wounding Event: Trauma Status: Date Acquired: 01/30/2018 Comorbid Cataracts, Chronic Obstructive Pulmonary Weeks Of Treatment: 4 History: Disease (COPD), Congestive Heart Failure, Clustered Wound: No Coronary Artery Disease,  Hypertension, Osteoarthritis Photos Photo Uploaded By: Virginia [name] on 04/07/2018 16:38:21 Wound Measurements Length: (cm) 0.7 Width: (cm) 1.3 Depth: (cm) 0.3 Area: (cm) 0.715 Volume: (cm) 0.214 % Reduction in Area: 36.3% % Reduction in Volume: 4.9% Epithelialization: None Tunneling: No Undermining: No Wound Description Full Thickness Without Exposed Support Classification: Structures Wound Margin: Flat and Intact Exudate Small Amount: Exudate Type: Serosanguineous Exudate Color: red, brown Foul Odor After Cleansing: No Slough/Fibrino Yes Wound Bed Granulation Amount: None Present (0%) Exposed Structure Necrotic Amount: Large (67-100%) Fascia Exposed: No Necrotic Quality: Adherent Slough Fat Layer (Subcutaneous Tissue) Exposed: Yes Tendon Exposed: No Muscle Exposed: No Joint Exposed: No Virginia [name], Virginia [name]. ([id number]) Bone Exposed: No Periwound Skin Texture Texture Color No Abnormalities Noted: No No Abnormalities Noted: No Callus: No Atrophie Blanche: No Crepitus: No Cyanosis: No Excoriation: No Ecchymosis: Yes Induration: No Erythema: No Rash: No Hemosiderin Staining: No Scarring: No Mottled: No Pallor: No Moisture Rubor: No No Abnormalities Noted: No Dry / Scaly: No Temperature / Pain Maceration: Yes Temperature: No Abnormality Tenderness on Palpation: Yes Wound Preparation Ulcer Cleansing: Rinsed/Irrigated with Saline Topical Anesthetic Applied: None, Other: spray benzocaine, Electronic Signature(s) Signed: 04/07/2018 5:47:31 PM By: Virginia [name], BSN, [name], CWS, Virginia [name], BSN Signed: 04/28/2018 7:45:54 AM By: Virginia [name] Entered By: Virginia [name] on 04/07/2018 14:31:18 Virginia [name] ([id number]) -------------------------------------------------------------------------------- Vitals Details Patient Name: Virginia Virginia [name]. Date of Service: 04/07/2018 2:15 PM Medical Record Number: [id number] Patient Account Number: [id number] Date of Birth/Sex:  1935-12-06 (82 y.o. F) Treating [name]: Virginia [name] Primary Care Raahi Korber: Virginia [name] Other Clinician: Referring Marjean Imperato: Virginia [name] Treating Dakari Stabler/Extender: Virginia [name] in Treatment: 4 Vital Signs Time Taken: 14:23 Temperature (  F): 98.2 Height (in): 63 Pulse (bpm): 72 Weight (lbs): 196 Respiratory Rate (breaths/min): 16 Body Mass Index (BMI): 34.7 Blood Pressure (mmHg): 113/53 Reference Range: 80 - 120 mg / dl Electronic Signature(s) Signed: 04/28/2018 7:45:54 AM By: Virginia [name] Entered By: Virginia [name] on 04/07/2018 14:24:13

## 2018-04-30 NOTE — Progress Notes (Signed)
TAURA, LAMARRE ([id number]) Visit Report for 04/28/2018 HPI Details Patient Name: Virginia [name], Virginia A. Date of Service: 04/28/2018 2:30 PM Medical Record Number: [id number] Patient Account Number: [id number] Date of Birth/Sex: [birth year] (82 y.o. F) Treating RN: Cornell Barman Primary Care Provider: Sherrie Mustache Other Clinician: Referring Provider: Sherrie Mustache Treating Provider/Extender: Tito Dine in Treatment: 7 History of Present Illness HPI Description: 03/10/18; is an 82 year old woman who is followed at [facility]. She is here for an open wound on the right posterior leg. Apparently this started in February with a fall. She was seen in the ER on 01/16/18 and the area was sutured. At some point the sutures were removed she was seen in her primary office on 02/09/18 at which time the wound had opened. It was felt to have cellulitis with redness drainage and tenderness and she was given doxycycline and Santyl was prescribed. they have been using Santyl and changing it daily. The patient has some form of dementia and I think is cared for by her brother. It is her daughter who brought her today. They tell us that she is sometimes reluctant to get out of bed or reluctant to go to appointments. patient is not a diabetic. ABIs in this clinic were 0.95 on the right The patient has dementia, congestive heart failure, coronary artery disease, hypertension and COPD. She apparently falls fairly frequently. The reason for this is not completely clear 03/17/18; small but punched out wound on the right posterior calf 03/31/18; small but punched out wound on the right posterior calf. We've been using Santyl. This is initially felt to be traumatic on a bed frame although nobody is completely certain about that they simply noticed blood on the bed. 04/02/18 on evaluation today patient appears to be doing a little bit worse when she was seen two days ago. Fortunately she does not seem to have  any evidence of severe infection although she does have some erythema surrounding the wound bed. Unfortunately she's also had increased discomfort and pain secondary to what her brother and sister feel is likely an infection. They state that she normally has a fairly high pain tolerance and has been really complaining of pain today. Patient does have dementia. 04/07/18; patient was in with erythema around the wound on 04/02/18. Culture that was done last time showed methicillin sensitive staph aureus which should've been covered by the trimethoprim/sulfamethoxazole we gave her. 04/14/18; the area on the right posterior calf which was initially a traumatic wound that was sutured but then the test. She is completing her antibiotics today. We've been using Santyl. Dimensions of the wound are smaller there is still some probing depth although very small wound. Somewhat firm around the wound which I think is probably scar tissue. She went for arterial studies. She was not able to tolerate ABIs. On the right she did have biphasic wave forms for the most part there was a 50-74% stenosis in the above-knee popliteal and a 30-49% stenosis in the common femoral artery. She has 2 vessel runoff E of the PTA and ATA. Peroneal artery was not visualized. The patient is not very ambulatory unsteady on her feet but I'm really not certain any of this represents claudication. 04/21/18; the patient has a small punched out area on the posterior calf. Covered in liquid slough nonviable subcutaneous tissue. This covered the tiny wound bed that is all reminiscent of this initial traumatic wound.we have been using Santyl 04/28/18; the area on the posterior calf is  smaller in fact very tiny and barely visible. Change the primary dressing to Iodosorb. I went over the arterialissues. It appears as though her wound is closing. She is not describing claudication although I don't think she is all that ambulatory. I would have a hard time  justifying and intervention in the SFA stenosis given the fact the wound is closing and the patient is not having any discomfort related to this. I have discussed this in detail with her brother Electronic Signature(s) Signed: 04/29/2018 8:24:56 AM By: Linton Ham MD Entered By: Linton Ham on 04/28/2018 15:24:01 [name] ([id number]) -------------------------------------------------------------------------------- Physical Exam Details Patient Name: Virginia Harold A. Date of Service: 04/28/2018 2:30 PM Medical Record Number: [id number] Patient Account Number: [id number] Date of Birth/Sex: Jul 23, 1936 (82 y.o. F) Treating RN: Cornell Barman Primary Care Provider: Sherrie Mustache Other Clinician: Referring Provider: Sherrie Mustache Treating Provider/Extender: Tito Dine in Treatment: 7 Constitutional Sitting or standing Blood Pressure is within target range for patient.. Pulse regular and within target range for patient.Marland Kitchen Respirations regular, non-labored and within target range.. Temperature is normal and within the target range for the patient.Marland Kitchen appears in no distress. Eyes Conjunctivae clear. No discharge. Respiratory Respiratory effort is easy and symmetric bilaterally. Rate is normal at rest and on room air.. Cardiovascular Pedal pulses absent. Lymphatic none palpable in the popliteal area bilaterally. Integumentary (Hair, Skin) no primary skin issue is seen. Psychiatric No evidence of depression, anxiety, or agitation. Calm, cooperative, and communicative. Appropriate interactions and affect.. Notes wound exam; a very small remaining open area. This is even too small to measure a depth. There is no surrounding infection no drainage Electronic Signature(s) Signed: 04/29/2018 8:24:56 AM By: Linton Ham MD Entered By: Linton Ham on 04/28/2018 15:25:21 [name]  ([id number]) -------------------------------------------------------------------------------- Physician Orders Details Patient Name: Virginia Harold A. Date of Service: 04/28/2018 2:30 PM Medical Record Number: [id number] Patient Account Number: [id number] Date of Birth/Sex: 01/30/1936 (82 y.o. F) Treating RN: Cornell Barman Primary Care Provider: Sherrie Mustache Other Clinician: Referring Provider: Sherrie Mustache Treating Provider/Extender: Tito Dine in Treatment: 7 Verbal / Phone Orders: No Diagnosis Coding Wound Cleansing Wound #1 Right,Posterior Lower Leg o Clean wound with Normal Saline. Primary Wound Dressing Wound #1 Right,Posterior Lower Leg o Iodoflex Secondary Dressing Wound #1 Right,Posterior Lower Leg o Dry Gauze o Boardered Foam Dressing Dressing Change Frequency Wound #1 Right,Posterior Lower Leg o Change dressing every other day. Follow-up Appointments Wound #1 Right,Posterior Lower Leg o Return Appointment in 1 week. Electronic Signature(s) Signed: 04/28/2018 5:21:30 PM By: Gretta Cool, BSN, RN, CWS, Kim RN, BSN Signed: 04/29/2018 8:24:56 AM By: Linton Ham MD Entered By: Gretta Cool, BSN, RN, CWS, Kim on 04/28/2018 15:06:02 [name] ([id number]) -------------------------------------------------------------------------------- Problem List Details Patient Name: Virginia Harold A. Date of Service: 04/28/2018 2:30 PM Medical Record Number: [id number] Patient Account Number: [id number] Date of Birth/Sex: 08/28/1936 (82 y.o. 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 Date of Service: 04/28/2018 2:30 PM Medical Record  Number: [id number] Patient Account Number: [id number] Date of Birth/Sex: [birth year] (82 y.o. F) Treating RN: Cornell Barman Primary Care Provider: Sherrie Mustache Other Clinician: Referring Provider: Sherrie Mustache Treating Provider/Extender: Tito Dine in Treatment: 7 Subjective History of Present Illness (HPI) 03/10/18; is an 82 year old woman who is followed at [facility]. She is here for an open wound on the right posterior leg. Apparently this started in February with a fall. She was seen in the ER on 01/16/18 and the area was sutured. At some point the sutures were removed she was seen in her primary office on 02/09/18 at which time the wound had opened. It was felt to have cellulitis with redness drainage and tenderness and she was given doxycycline and Santyl was prescribed. they have been using Santyl and changing it daily. The patient has some form of dementia and I think is cared for by her brother. It is her daughter who brought her today. They tell us that she is sometimes reluctant to get out of bed or reluctant to go to appointments. patient is not a diabetic. ABIs in this clinic were 0.95 on the right The patient has dementia, congestive heart failure, coronary artery disease, hypertension and COPD. She apparently falls fairly frequently. The reason for this is not completely clear 03/17/18; small but punched out wound on the right posterior calf 03/31/18; small but punched out wound on the right posterior calf. We've been using Santyl. This is  initially felt to be traumatic on a bed frame although nobody is completely certain about that they simply noticed blood on the bed. 04/02/18 on evaluation today patient appears to be doing a little bit worse when she was seen two days ago. Fortunately she does not seem to have any evidence of severe infection although she does have some erythema surrounding the wound bed. Unfortunately she's also had increased discomfort and pain secondary to what her brother and sister feel is likely an infection. They state that she normally has a fairly high pain tolerance and has been really complaining of pain today. Patient does have dementia. 04/07/18; patient was in with erythema around the wound on 04/02/18. Culture that was done last time showed methicillin sensitive staph aureus which should've been covered by the trimethoprim/sulfamethoxazole we gave her. 04/14/18; the area on the right posterior calf which was initially a traumatic wound that was sutured but then the test. She is completing her antibiotics today. We've been using Santyl. Dimensions of the wound are smaller there is still some probing depth although very small wound. Somewhat firm around the wound which I think is probably scar tissue. She went for arterial studies. She was not able to tolerate ABIs. On the right she did have biphasic wave forms for the most part there was a 50-74% stenosis in the above-knee popliteal and a 30-49% stenosis in the common femoral artery. She has 2 vessel runoff E of the PTA and ATA. Peroneal artery was not visualized. The patient is not very ambulatory unsteady on her feet but I'm really not certain any of this represents claudication. 04/21/18; the patient has a small punched out area on the posterior calf. Covered in liquid slough nonviable subcutaneous tissue. This covered the tiny wound bed that is all reminiscent of this initial traumatic wound.we have been using Santyl 04/28/18; the area on the posterior  calf is smaller in fact very tiny and barely visible. Change the primary dressing to Iodosorb. I went over the arterialissues. It appears as  though her wound is closing. She is not describing claudication although I don't think she is all that ambulatory. I would have a hard time justifying and intervention in the SFA stenosis given the fact the wound is closing and the patient is not having any discomfort related to this. I have discussed this in detail with her brother Objective SILVA, AAMODT A. ([id number]) Constitutional Sitting or standing Blood Pressure is within target range for patient.. Pulse regular and within target range for patient.Marland Kitchen Respirations regular, non-labored and within target range.. Temperature is normal and within the target range for the patient.Marland Kitchen appears in no distress. Vitals Time Taken: 2:51 PM, Height: 63 in, Weight: 196 lbs, BMI: 34.7, Temperature: 98.3 F, Pulse: 86 bpm, Respiratory Rate: 16 breaths/min, Blood Pressure: 110/52 mmHg. Eyes Conjunctivae clear. No discharge. Respiratory Respiratory effort is easy and symmetric bilaterally. Rate is normal at rest and on room air.. Cardiovascular Pedal pulses absent. Lymphatic none palpable in the popliteal area bilaterally. Psychiatric No evidence of depression, anxiety, or agitation. Calm, cooperative, and communicative. Appropriate interactions and affect.. General Notes: wound exam; a very small remaining open area. This is even too small to measure a depth. There is no surrounding infection no drainage Integumentary (Hair, Skin) no primary skin issue is seen. Wound #1 status is Open. Original cause of wound was Trauma. The wound is located on the Right,Posterior Lower Leg. The wound measures 0.2cm length x 0.3cm width x 0.2cm depth; 0.047cm^2 area and 0.009cm^3 volume. There is Fat Layer (Subcutaneous Tissue) Exposed exposed. There is no tunneling or undermining noted. There is a large amount of  serous drainage noted. The wound margin is flat and intact. There is medium (34-66%) pink granulation within the wound bed. There is a medium (34-66%) amount of necrotic tissue within the wound bed including Adherent Slough. The periwound skin appearance exhibited: Maceration, Ecchymosis. The periwound skin appearance did not exhibit: Callus, Crepitus, Excoriation, Induration, Rash, Scarring, Dry/Scaly, Atrophie Blanche, Cyanosis, Hemosiderin Staining, Mottled, Pallor, Rubor, Erythema. Periwound temperature was noted as No Abnormality. The periwound has tenderness on palpation. Assessment Active Problems ICD-10 L97.213 - 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scle S81.811D - Laceration without foreign body, right lower leg, subsequent encounter Plan MALGORZATA, ALBERT A. ([id number]) Wound Cleansing: Wound #1 Right,Posterior Lower Leg: Clean wound with Normal Saline. Primary Wound Dressing: Wound #1 Right,Posterior Lower Leg: Iodoflex Secondary Dressing: Wound #1 Right,Posterior Lower Leg: Dry Gauze Boardered Foam Dressing Dressing Change Frequency: Wound #1 Right,Posterior Lower Leg: Change dressing every other day. Follow-up Appointments: Wound #1 Right,Posterior Lower Leg: Return Appointment in 1 week. #1 I'm going to use Iodosorb or Iodoflex to this area to see if it'll promote adherence #2 I don't think we need to act on the right SFA stenosis. She apparently follows with vein and vascular and has a appointment in December. They'll be able to look at these issues. Certainly if she would get something in her leg that looks arterial in nature I think it would make it better argument for going ahead and trying to address the SFA stenosis. Electronic Signature(s) Signed: 04/29/2018 8:24:56 AM By: Linton Ham MD Entered By: Linton Ham on 04/28/2018 15:26:49 [name]  ([id number]) -------------------------------------------------------------------------------- SuperBill Details Patient Name: Virginia Harold A. Date of Service: 04/28/2018 Medical Record Number: [id number] Patient Account Number: [id number] Date of Birth/Sex: [birth year] (82 y.o. F) Treating RN: Cornell Barman Primary Care Provider: Sherrie Mustache Other Clinician: Referring Provider: Sherrie Mustache Treating Provider/Extender: Tito Dine in Treatment:  7 Diagnosis Coding ICD-10 Codes Code Description [phone] 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scle S81.811D Laceration without foreign body, right lower leg, subsequent encounter Facility Procedures CPT4 Code: [id number] Description: [phone] - WOUND CARE VISIT-LEV 2 EST PT Modifier: Quantity: 1 Physician Procedures CPT4 Code: [id number] Description: 50093 - WC PHYS LEVEL 3 - EST PT ICD-10 Diagnosis Description G18.299 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

## 2018-05-02 NOTE — Progress Notes (Signed)
Virginia [name], Virginia [name] ([id number]) Visit Report for 04/28/2018 Arrival Information Details Patient Name: Virginia [name], Virginia [name]. Date of Service: 04/28/2018 2:30 PM Medical Record Number: [id number] Patient Account Number: [id number] Date of Birth/Sex: 1936-08-03 (82 y.o. F) Treating RN: Ahmed Prima Primary Care Madelon Welsch: Sherrie Mustache Other Clinician: Referring Aundra Pung: Sherrie Mustache Treating Damone Fancher/Extender: Tito Dine in Treatment: 7 Visit Information History Since Last Visit All ordered tests and consults were completed: No Patient Arrived: Wheel Chair Added or deleted any medications: No Arrival Time: 14:48 Any new allergies or adverse reactions: No Accompanied By: family Had a fall or experienced change in No Transfer Assistance: EasyPivot Patient activities of daily living that may affect Lift risk of falls: Patient Identification Verified: Yes Signs or symptoms of abuse/neglect since last visito No Secondary Verification Process Yes Hospitalized since last visit: No Completed: Implantable device outside of the clinic excluding No Patient Requires Transmission-Based No cellular tissue based products placed in the center Precautions: since last visit: Patient Has Alerts: No Has Dressing in Place as Prescribed: Yes Pain Present Now: No Electronic Signature(s) Signed: 04/30/2018 5:38:20 PM By: Alric Quan Entered By: Alric Quan on 04/28/2018 14:50:06 Virginia [name] ([id number]) -------------------------------------------------------------------------------- Clinic Level of Care Assessment Details Patient Name: Virginia Harold A. Date of Service: 04/28/2018 2:30 PM Medical Record Number: [id number] Patient Account Number: [id number] Date of Birth/Sex: 1936/07/04 (82 y.o. F) Treating RN: Cornell Barman Primary Care Ellerie Arenz: Sherrie Mustache Other Clinician: Referring Dorion Petillo: Sherrie Mustache Treating Herta Hink/Extender: Tito Dine in  Treatment: 7 Clinic Level of Care Assessment Items TOOL 4 Quantity Score []  - Use when only an EandM is performed on FOLLOW-UP visit 0 ASSESSMENTS - Nursing Assessment / Reassessment []  - Reassessment of Co-morbidities (includes updates in patient status) 0 X- 1 5 Reassessment of Adherence to Treatment Plan ASSESSMENTS - Wound and Skin Assessment / Reassessment X - Simple Wound Assessment / Reassessment - one wound 1 5 []  - 0 Complex Wound Assessment / Reassessment - multiple wounds []  - 0 Dermatologic / Skin Assessment (not related to wound area) ASSESSMENTS - Focused Assessment []  - Circumferential Edema Measurements - multi extremities 0 []  - 0 Nutritional Assessment / Counseling / Intervention []  - 0 Lower Extremity Assessment (monofilament, tuning fork, pulses) []  - 0 Peripheral Arterial Disease Assessment (using hand held doppler) ASSESSMENTS - Ostomy and/or Continence Assessment and Care []  - Incontinence Assessment and Management 0 []  - 0 Ostomy Care Assessment and Management (repouching, etc.) PROCESS - Coordination of Care X - Simple Patient / Family Education for ongoing care 1 15 []  - 0 Complex (extensive) Patient / Family Education for ongoing care []  - 0 Staff obtains Programmer, systems, Records, Test Results / Process Orders []  - 0 Staff telephones HHA, Nursing Homes / Clarify orders / etc []  - 0 Routine Transfer to another Facility (non-emergent condition) []  - 0 Routine Hospital Admission (non-emergent condition) []  - 0 New Admissions / Biomedical engineer / Ordering NPWT, Apligraf, etc. []  - 0 Emergency Hospital Admission (emergent condition) X- 1 10 Simple Discharge Coordination Virginia [name], Virginia A. ([id number]) []  - 0 Complex (extensive) Discharge Coordination PROCESS - Special Needs []  - Pediatric / Minor Patient Management 0 []  - 0 Isolation Patient Management []  - 0 Hearing / Language / Visual special needs []  - 0 Assessment of Community assistance  (transportation, D/C planning, etc.) []  - 0 Additional assistance / Altered mentation []  - 0 Support Surface(s) Assessment (bed, cushion, seat, etc.) INTERVENTIONS - Wound Cleansing / Measurement X - Simple Wound Cleansing -  one wound 1 5 []  - 0 Complex Wound Cleansing - multiple wounds X- 1 5 Wound Imaging (photographs - any number of wounds) []  - 0 Wound Tracing (instead of photographs) X- 1 5 Simple Wound Measurement - one wound []  - 0 Complex Wound Measurement - multiple wounds INTERVENTIONS - Wound Dressings []  - Small Wound Dressing one or multiple wounds 0 X- 1 15 Medium Wound Dressing one or multiple wounds []  - 0 Large Wound Dressing one or multiple wounds []  - 0 Application of Medications - topical []  - 0 Application of Medications - injection INTERVENTIONS - Miscellaneous []  - External ear exam 0 []  - 0 Specimen Collection (cultures, biopsies, blood, body fluids, etc.) []  - 0 Specimen(s) / Culture(s) sent or taken to Lab for analysis []  - 0 Patient Transfer (multiple staff / Civil Service fast streamer / Similar devices) []  - 0 Simple Staple / Suture removal (25 or less) []  - 0 Complex Staple / Suture removal (26 or more) []  - 0 Hypo / Hyperglycemic Management (close monitor of Blood Glucose) []  - 0 Ankle / Brachial Index (ABI) - do not check if billed separately X- 1 5 Vital Signs Virginia [name], Virginia A. ([id number]) Has the patient been seen at the hospital within the last three years: Yes Total Score: 70 Level Of Care: New/Established - Level 2 Electronic Signature(s) Signed: 04/28/2018 5:21:30 PM By: Gretta Cool, BSN, RN, CWS, Kim RN, BSN Entered By: Gretta Cool, BSN, RN, CWS, Kim on 04/28/2018 15:06:42 Virginia [name] ([id number]) -------------------------------------------------------------------------------- Encounter Discharge Information Details Patient Name: Virginia Harold A. Date of Service: 04/28/2018 2:30 PM Medical Record Number: [id number] Patient Account Number:  [id number] Date of Birth/Sex: 10/01/1936 (82 y.o. F) Treating RN: Cornell Barman Primary Care [place]: Sherrie Mustache Other Clinician: Referring Gwendy Boeder: Sherrie Mustache Treating Ernestina Joe/Extender: Tito Dine in Treatment: 7 Encounter Discharge Information Items Discharge Condition: Stable Ambulatory Status: Walker Discharge Destination: Home Transportation: Private Auto Accompanied By: brother Schedule Follow-up Appointment: Yes Clinical Summary of Care: Electronic Signature(s) Signed: 04/28/2018 3:22:39 PM By: Gretta Cool, BSN, RN, CWS, Kim RN, BSN Entered By: Gretta Cool, BSN, RN, CWS, Kim on 04/28/2018 15:22:38 Virginia [name] ([id number]) -------------------------------------------------------------------------------- Lower Extremity Assessment Details Patient Name: Virginia Harold A. Date of Service: 04/28/2018 2:30 PM Medical Record Number: [id number] Patient Account Number: [id number] Date of Birth/Sex: February 25, 1936 (82 y.o. F) Treating RN: Ahmed Prima Primary Care Wally Behan: Sherrie Mustache Other Clinician: Referring Atif Chapple: Sherrie Mustache Treating Trevontae Lindahl/Extender: Tito Dine in Treatment: 7 Vascular Assessment Pulses: Dorsalis Pedis Palpable: [Right:Yes] Posterior Tibial Extremity colors, hair growth, and conditions: Extremity Color: [Right:Normal] Temperature of Extremity: [Right:Warm] Capillary Refill: [Right:< 3 seconds] Toe Nail Assessment Left: Right: Thick: Yes Discolored: No Deformed: No Improper Length and Hygiene: No Electronic Signature(s) Signed: 04/30/2018 5:38:20 PM By: Alric Quan Entered By: Alric Quan on 04/28/2018 14:57:45 Virginia [name] ([id number]) -------------------------------------------------------------------------------- Multi Wound Chart Details Patient Name: Virginia Harold A. Date of Service: 04/28/2018 2:30 PM Medical Record Number: [id number] Patient Account Number: [id number] Date of Birth/Sex: [birth year]  (82 y.o. F) Treating RN: Cornell Barman Primary Care Roger Fasnacht: Sherrie Mustache Other Clinician: Referring Toretto Tingler: Sherrie Mustache Treating Ginni Eichler/Extender: Tito Dine in Treatment: 7 Vital Signs Height(in): 63 Pulse(bpm): 2 Weight(lbs): 196 Blood Pressure(mmHg): 110/52 Body Mass Index(BMI): 35 Temperature(F): 98.3 Respiratory Rate 16 (breaths/min): Photos: [1:No Photos] [N/A:N/A] Wound Location: [1:Right Lower Leg - Posterior] [N/A:N/A] Wounding Event: [1:Trauma] [N/A:N/A] Primary Etiology: [1:Venous Leg Ulcer] [N/A:N/A] Comorbid History: [1:Cataracts, Chronic Obstructive N/A Pulmonary Disease (COPD), Congestive Heart Failure, Coronary Artery Disease, Hypertension,  Osteoarthritis] Date Acquired: [1:01/30/2018] [N/A:N/A] Weeks of Treatment: [1:7] [N/A:N/A] Wound Status: [1:Open] [N/A:N/A] Measurements L x W x D [1:0.2x0.3x0.2] [N/A:N/A] (cm) Area (cm) : [1:0.047] [N/A:N/A] Volume (cm) : [1:0.009] [N/A:N/A] % Reduction in Area: [1:95.80%] [N/A:N/A] % Reduction in Volume: [1:96.00%] [N/A:N/A] Classification: [1:Full Thickness Without Exposed Support Structures] [N/A:N/A] Exudate Amount: [1:Large] [N/A:N/A] Exudate Type: [1:Serous] [N/A:N/A] Exudate Color: [1:amber] [N/A:N/A] Wound Margin: [1:Flat and Intact] [N/A:N/A] Granulation Amount: [1:Medium (34-66%)] [N/A:N/A] Granulation Quality: [1:Pink] [N/A:N/A] Necrotic Amount: [1:Medium (34-66%)] [N/A:N/A] Exposed Structures: [1:Fat Layer (Subcutaneous Tissue) Exposed: Yes Fascia: No Tendon: No Muscle: No Joint: No Bone: No] [N/A:N/A] Epithelialization: [1:None] [N/A:N/A] Periwound Skin Texture: [1:Excoriation: No Induration: No] [N/A:N/A] Callus: No Crepitus: No Rash: No Scarring: No Periwound Skin Moisture: Maceration: Yes N/A N/A Dry/Scaly: No Periwound Skin Color: Ecchymosis: Yes N/A N/A Atrophie Blanche: No Cyanosis: No Erythema: No Hemosiderin Staining: No Mottled: No Pallor: No Rubor:  No Temperature: No Abnormality N/A N/A Tenderness on Palpation: Yes N/A N/A Wound Preparation: Ulcer Cleansing: N/A N/A Rinsed/Irrigated with Saline Topical Anesthetic Applied: Other: lidocaine 4% Treatment Notes Wound #1 (Right, Posterior Lower Leg) 1. Cleansed with: Clean wound with Normal Saline 4. Dressing Applied: Iodoflex 5. Secondary Dressing Applied Bordered Foam Dressing Electronic Signature(s) Signed: 04/29/2018 8:24:56 AM By: Linton Ham MD Entered By: Linton Ham on 04/28/2018 15:22:01 Virginia [name] ([id number]) -------------------------------------------------------------------------------- Multi-Disciplinary Care Plan Details Patient Name: Virginia Harold A. Date of Service: 04/28/2018 2:30 PM Medical Record Number: [id number] Patient Account Number: [id number] Date of Birth/Sex: May 23, 1936 (82 y.o. F) Treating RN: Cornell Barman Primary Care Kenshawn Maciolek: Sherrie Mustache Other Clinician: Referring Lorali Khamis: Sherrie Mustache Treating Julius Matus/Extender: Tito Dine in Treatment: 7 Active Inactive ` Abuse / Safety / Falls / Self Care Management Nursing Diagnoses: History of Falls Potential for falls Goals: Patient will not experience any injury related to falls Date Initiated: 03/10/2018 Target Resolution Date: 04/09/2018 Goal Status: Active Interventions: Podiatry chair, stretcher in low position and side rails up as needed Assess fall risk on admission and as needed Notes: ` Orientation to the Wound Care Program Nursing Diagnoses: Knowledge deficit related to the wound healing center program Goals: Patient/caregiver will verbalize understanding of the [facility] Date Initiated: 03/10/2018 Target Resolution Date: 04/09/2018 Goal Status: Active Interventions: Provide education on orientation to the wound center Notes: ` Soft Tissue Infection Nursing Diagnoses: Impaired tissue integrity Potential for infection: soft  tissue Goals: Patient will remain free of wound infection Date Initiated: 03/10/2018 Target Resolution Date: 04/09/2018 Virginia [name], Virginia [name] ([id number]) Goal Status: Active Interventions: Assess signs and symptoms of infection every visit Notes: ` Wound/Skin Impairment Nursing Diagnoses: Impaired tissue integrity Goals: Ulcer/skin breakdown will have a volume reduction of 80% by week 12 Date Initiated: 03/10/2018 Target Resolution Date: 07/10/2018 Goal Status: Active Interventions: Provide education on ulcer and skin care Treatment Activities: Skin care regimen initiated : 03/10/2018 Topical wound management initiated : 03/10/2018 Notes: Electronic Signature(s) Signed: 04/28/2018 5:21:30 PM By: Gretta Cool, BSN, RN, CWS, Kim RN, BSN Entered By: Gretta Cool, BSN, RN, CWS, Kim on 04/28/2018 15:04:27 Virginia [name] ([id number]) -------------------------------------------------------------------------------- Pain Assessment Details Patient Name: Virginia Harold A. Date of Service: 04/28/2018 2:30 PM Medical Record Number: [id number] Patient Account Number: [id number] Date of Birth/Sex: May 02, 1936 (82 y.o. F) Treating RN: Ahmed Prima Primary Care Wai Litt: Sherrie Mustache Other Clinician: Referring Carrianne Hyun: Sherrie Mustache Treating Veretta Sabourin/Extender: Tito Dine in Treatment: 7 Active Problems Location of Pain Severity and Description of Pain Patient Has Paino No Site Locations Pain Management and Medication Current  Pain Management: Electronic Signature(s) Signed: 04/30/2018 5:38:20 PM By: Alric Quan Entered By: Alric Quan on 04/28/2018 14:50:15 Virginia [name] ([id number]) -------------------------------------------------------------------------------- Patient/Caregiver Education Details Patient Name: Virginia Harold A. Date of Service: 04/28/2018 2:30 PM Medical Record Number: [id number] Patient Account Number: [id number] Date of Birth/Gender: Dec 09, 1935 (82 y.o.  F) Treating RN: Cornell Barman Primary Care Physician: Sherrie Mustache Other Clinician: Referring Physician: Sherrie Mustache Treating Physician/Extender: Tito Dine in Treatment: 7 Education Assessment Education Provided To: Caregiver Education Topics Provided Wound/Skin Impairment: Handouts: Caring for Your Ulcer Methods: Demonstration, Explain/Verbal Responses: State content correctly Electronic Signature(s) Signed: 04/28/2018 5:21:30 PM By: Gretta Cool, BSN, RN, CWS, Kim RN, BSN Entered By: Gretta Cool, BSN, RN, CWS, Kim on 04/28/2018 15:23:01 Virginia [name] ([id number]) -------------------------------------------------------------------------------- Wound Assessment Details Patient Name: Virginia Harold A. Date of Service: 04/28/2018 2:30 PM Medical Record Number: [id number] Patient Account Number: [id number] Date of Birth/Sex: 22-Nov-1936 (82 y.o. F) Treating RN: Ahmed Prima Primary Care Labrisha Wuellner: Sherrie Mustache Other Clinician: Referring Jesus Nevills: Sherrie Mustache Treating Kaden Dunkel/Extender: Tito Dine in Treatment: 7 Wound Status Wound Number: 1 Primary Venous Leg Ulcer Etiology: Wound Location: Right Lower Leg - Posterior Wound Open Wounding Event: Trauma Status: Date Acquired: 01/30/2018 Comorbid Cataracts, Chronic Obstructive Pulmonary Weeks Of Treatment: 7 History: Disease (COPD), Congestive Heart Failure, Clustered Wound: No Coronary Artery Disease, Hypertension, Osteoarthritis Photos Photo Uploaded By: Alric Quan on 04/29/2018 08:18:12 Wound Measurements Length: (cm) 0.2 Width: (cm) 0.3 Depth: (cm) 0.2 Area: (cm) 0.047 Volume: (cm) 0.009 % Reduction in Area: 95.8% % Reduction in Volume: 96% Epithelialization: None Tunneling: No Undermining: No Wound Description Full Thickness Without Exposed Support Classification: Structures Wound Margin: Flat and Intact Exudate Large Amount: Exudate Type: Serous Exudate Color:  amber Foul Odor After Cleansing: No Slough/Fibrino Yes Wound Bed Granulation Amount: Medium (34-66%) Exposed Structure Granulation Quality: Pink Fascia Exposed: No Necrotic Amount: Medium (34-66%) Fat Layer (Subcutaneous Tissue) Exposed: Yes Necrotic Quality: Adherent Slough Tendon Exposed: No Muscle Exposed: No Joint Exposed: No Bone Exposed: No Periwound Skin Texture Siska, Anani A. ([id number]) Texture Color No Abnormalities Noted: No No Abnormalities Noted: No Callus: No Atrophie Blanche: No Crepitus: No Cyanosis: No Excoriation: No Ecchymosis: Yes Induration: No Erythema: No Rash: No Hemosiderin Staining: No Scarring: No Mottled: No Pallor: No Moisture Rubor: No No Abnormalities Noted: No Dry / Scaly: No Temperature / Pain Maceration: Yes Temperature: No Abnormality Tenderness on Palpation: Yes Wound Preparation Ulcer Cleansing: Rinsed/Irrigated with Saline Topical Anesthetic Applied: Other: lidocaine 4%, Treatment Notes Wound #1 (Right, Posterior Lower Leg) 1. Cleansed with: Clean wound with Normal Saline 4. Dressing Applied: Iodoflex 5. Secondary Dressing Applied Bordered Foam Dressing Electronic Signature(s) Signed: 04/30/2018 5:38:20 PM By: Alric Quan Entered By: Alric Quan on 04/28/2018 14:57:00 Virginia [name] ([id number]) -------------------------------------------------------------------------------- Vitals Details Patient Name: Virginia Harold A. Date of Service: 04/28/2018 2:30 PM Medical Record Number: [id number] Patient Account Number: [id number] Date of Birth/Sex: 01/02/1936 (82 y.o. F) Treating RN: Ahmed Prima Primary Care Nahshon Reich: Sherrie Mustache Other Clinician: Referring Geovani Tootle: Sherrie Mustache Treating Secilia Apps/Extender: Tito Dine in Treatment: 7 Vital Signs Time Taken: 14:51 Temperature (F): 98.3 Height (in): 63 Pulse (bpm): 86 Weight (lbs): 196 Respiratory Rate (breaths/min): 16 Body Mass  Index (BMI): 34.7 Blood Pressure (mmHg): 110/52 Reference Range: 80 - 120 mg / dl Electronic Signature(s) Signed: 04/30/2018 5:38:20 PM By: Alric Quan Entered By: Alric Quan on 04/28/2018 14:56:28

## 2018-05-03 ENCOUNTER — Ambulatory Visit: Payer: Medicare Other

## 2018-05-04 ENCOUNTER — Ambulatory Visit: Payer: Medicare Other | Admitting: Nurse Practitioner

## 2018-05-05 ENCOUNTER — Encounter: Payer: Medicare Other | Attending: Internal Medicine | Admitting: Internal Medicine

## 2018-05-05 DIAGNOSIS — I2510 Atherosclerotic heart disease of native coronary artery without angina pectoris: Secondary | ICD-10-CM | POA: Diagnosis not present

## 2018-05-05 DIAGNOSIS — F0390 Unspecified dementia without behavioral disturbance: Secondary | ICD-10-CM | POA: Insufficient documentation

## 2018-05-05 DIAGNOSIS — S81801A Unspecified open wound, right lower leg, initial encounter: Secondary | ICD-10-CM | POA: Diagnosis not present

## 2018-05-05 DIAGNOSIS — J449 Chronic obstructive pulmonary disease, unspecified: Secondary | ICD-10-CM | POA: Insufficient documentation

## 2018-05-05 DIAGNOSIS — I110 Hypertensive heart disease with heart failure: Secondary | ICD-10-CM | POA: Insufficient documentation

## 2018-05-05 DIAGNOSIS — L97213 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scle: Secondary | ICD-10-CM | POA: Insufficient documentation

## 2018-05-05 DIAGNOSIS — I509 Heart failure, unspecified: Secondary | ICD-10-CM | POA: Diagnosis not present

## 2018-05-06 NOTE — Telephone Encounter (Signed)
Palliative Care SW phoned patient's daughter but her vm was full.  SW then phoned patient's son, Virginia [name], and requested a visit for the PMPM program.  SW provided education regarding the program.  He stated he wanted to discuss this with Sherrie Mustache before making a decision.  Patient has an appointment on 05/10/18.

## 2018-05-07 NOTE — Progress Notes (Signed)
ESSYNCE, MUNSCH ([id number]) Visit Report for 05/05/2018 Arrival Information Details Patient Name: Virginia [name], Virginia [name]. Date of Service: 05/05/2018 3:15 PM Medical Record Number: [id number] Patient Account Number: [id number] Date of Birth/Sex: 06/28/1936 (82 y.o. F) Treating RN: Virginia [name] Primary Care Virginia [name]: Virginia [name] Other Clinician: Referring Virginia [name]: Virginia [name] Treating Virginia [name]: Virginia [name] in Treatment: 8 Visit Information History Since Last Visit All ordered tests and consults were completed: No Patient Arrived: Wheel Chair Added or deleted any medications: No Arrival Time: 15:23 Any new allergies or adverse reactions: No Accompanied By: brother Had a fall or experienced change in No Transfer Assistance: EasyPivot Patient activities of daily living that may affect Lift risk of falls: Patient Identification Verified: Yes Signs or symptoms of abuse/neglect since last visito No Secondary Verification Process Yes Hospitalized since last visit: No Completed: Implantable device outside of the clinic excluding No Patient Requires Transmission-Based No cellular tissue based products placed in the center Precautions: since last visit: Patient Has Alerts: No Has Dressing in Place as Prescribed: Yes Pain Present Now: No Electronic Signature(s) Signed: 05/06/2018 2:43:37 PM By: Alric Quan Entered By: Alric Quan on 05/05/2018 15:26:34 Virginia [name] ([id number]) -------------------------------------------------------------------------------- Clinic Level of Care Assessment Details Patient Name: Virginia Harold A. Date of Service: 05/05/2018 3:15 PM Medical Record Number: [id number] Patient Account Number: [id number] Date of Birth/Sex: 05-02-1936 (82 y.o. F) Treating RN: Virginia [name] Primary Care Enya Bureau: Virginia [name] Other Clinician: Referring Virginia [name]: Virginia [name] Treating Virginia [name]/Extender: Virginia [name] in Treatment:  8 Clinic Level of Care Assessment Items TOOL 4 Quantity Score []  - Use when only an EandM is performed on FOLLOW-UP visit 0 ASSESSMENTS - Nursing Assessment / Reassessment []  - Reassessment of Co-morbidities (includes updates in patient status) 0 X- 1 5 Reassessment of Adherence to Treatment Plan ASSESSMENTS - Wound and Skin Assessment / Reassessment X - Simple Wound Assessment / Reassessment - one wound 1 5 []  - 0 Complex Wound Assessment / Reassessment - multiple wounds []  - 0 Dermatologic / Skin Assessment (not related to wound area) ASSESSMENTS - Focused Assessment []  - Circumferential Edema Measurements - multi extremities 0 []  - 0 Nutritional Assessment / Counseling / Intervention []  - 0 Lower Extremity Assessment (monofilament, tuning fork, pulses) []  - 0 Peripheral Arterial Disease Assessment (using hand held doppler) ASSESSMENTS - Ostomy and/or Continence Assessment and Care []  - Incontinence Assessment and Management 0 []  - 0 Ostomy Care Assessment and Management (repouching, etc.) PROCESS - Coordination of Care X - Simple Patient / Family Education for ongoing care 1 15 []  - 0 Complex (extensive) Patient / Family Education for ongoing care []  - 0 Staff obtains Programmer, systems, Records, Test Results / Process Orders []  - 0 Staff telephones HHA, Nursing Homes / Clarify orders / etc []  - 0 Routine Transfer to another Facility (non-emergent condition) []  - 0 Routine Hospital Admission (non-emergent condition) []  - 0 New Admissions / Biomedical engineer / Ordering NPWT, Apligraf, etc. []  - 0 Emergency Hospital Admission (emergent condition) []  - 0 Simple Discharge Coordination [name], [name] ([id number]) []  - 0 Complex (extensive) Discharge Coordination PROCESS - Special Needs []  - Pediatric / Minor Patient Management 0 []  - 0 Isolation Patient Management []  - 0 Hearing / Language / Visual special needs []  - 0 Assessment of Community assistance  (transportation, D/C planning, etc.) []  - 0 Additional assistance / Altered mentation []  - 0 Support Surface(s) Assessment (bed, cushion, seat, etc.) INTERVENTIONS - Wound Cleansing / Measurement X - Simple Wound Cleansing -  one wound 1 5 []  - 0 Complex Wound Cleansing - multiple wounds X- 1 5 Wound Imaging (photographs - any number of wounds) []  - 0 Wound Tracing (instead of photographs) X- 1 5 Simple Wound Measurement - one wound []  - 0 Complex Wound Measurement - multiple wounds INTERVENTIONS - Wound Dressings X - Small Wound Dressing one or multiple wounds 1 10 []  - 0 Medium Wound Dressing one or multiple wounds []  - 0 Large Wound Dressing one or multiple wounds []  - 0 Application of Medications - topical []  - 0 Application of Medications - injection INTERVENTIONS - Miscellaneous []  - External ear exam 0 []  - 0 Specimen Collection (cultures, biopsies, blood, body fluids, etc.) []  - 0 Specimen(s) / Culture(s) sent or taken to Lab for analysis []  - 0 Patient Transfer (multiple staff / Civil Service fast streamer / Similar devices) []  - 0 Simple Staple / Suture removal (25 or less) []  - 0 Complex Staple / Suture removal (26 or more) []  - 0 Hypo / Hyperglycemic Management (close monitor of Blood Glucose) []  - 0 Ankle / Brachial Index (ABI) - do not check if billed separately X- 1 5 Vital Signs Virginia [name], Virginia A. ([id number]) Has the patient been seen at the hospital within the last three years: Yes Total Score: 55 Level Of Care: New/Established - Level 2 Electronic Signature(s) Signed: 05/06/2018 2:15:42 PM By: Gretta Cool, BSN, RN, CWS, Kim RN, BSN Entered By: Gretta Cool, BSN, RN, CWS, Virginia [name] on 05/05/2018 15:47:38 Virginia [name] ([id number]) -------------------------------------------------------------------------------- Encounter Discharge Information Details Patient Name: Virginia Harold A. Date of Service: 05/05/2018 3:15 PM Medical Record Number: [id number] Patient Account Number:  [id number] Date of Birth/Sex: 1935-12-15 (82 y.o. F) Treating RN: Montey Hora Primary Care Keeyon Privitera: Virginia [name] Other Clinician: Referring Seylah Wernert: Virginia [name] Treating Jaemarie Hochberg/Extender: Virginia [name] in Treatment: 8 Encounter Discharge Information Items Discharge Condition: Stable Ambulatory Status: Wheelchair Discharge Destination: Home Transportation: Private Auto Accompanied By: brother Schedule Follow-up Appointment: Yes Clinical Summary of Care: Electronic Signature(s) Signed: 05/05/2018 4:25:30 PM By: Montey Hora Entered By: Montey Hora on 05/05/2018 16:25:29 Virginia [name] ([id number]) -------------------------------------------------------------------------------- Lower Extremity Assessment Details Patient Name: Virginia Harold A. Date of Service: 05/05/2018 3:15 PM Medical Record Number: [id number] Patient Account Number: [id number] Date of Birth/Sex: 06/11/1936 (82 y.o. F) Treating RN: Virginia [name] Primary Care Bernhardt Riemenschneider: Virginia [name] Other Clinician: Referring Lamees Gable: Virginia [name] Treating Cherene Dobbins/Extender: Virginia [name] in Treatment: 8 Edema Assessment Assessed: [Left: No] [Right: No] Edema: [Left: Ye] [Right: s] Calf Left: Right: Point of Measurement: 34 cm From Medial Instep cm 36.2 cm Ankle Left: Right: Point of Measurement: 11 cm From Medial Instep cm 23 cm Vascular Assessment Pulses: Dorsalis Pedis Palpable: [Right:Yes] Posterior Tibial Extremity colors, hair growth, and conditions: Extremity Color: [Right:Normal] Temperature of Extremity: [Right:Warm] Capillary Refill: [Right:< 3 seconds] Toe Nail Assessment Left: Right: Thick: Yes Discolored: No Deformed: No Improper Length and Hygiene: No Electronic Signature(s) Signed: 05/06/2018 2:43:37 PM By: Alric Quan Entered By: Alric Quan on 05/05/2018 15:31:48 Virginia [name]  ([id number]) -------------------------------------------------------------------------------- Multi Wound Chart Details Patient Name: Virginia Harold A. Date of Service: 05/05/2018 3:15 PM Medical Record Number: [id number] Patient Account Number: [id number] Date of Birth/Sex: [birth year] (82 y.o. F) Treating RN: Virginia [name] Primary Care Whyatt Klinger: Virginia [name] Other Clinician: Referring Nealie Mchatton: Virginia [name] Treating Annalaya Wile/Extender: Virginia [name] in Treatment: 8 Vital Signs Height(in): 63 Pulse(bpm): 81 Weight(lbs): 196 Blood Pressure(mmHg): 114/55 Body Mass Index(BMI): 35 Temperature(F): 98.1 Respiratory Rate 16 (breaths/min): Photos: [1:No Photos] [N/A:N/A] Wound  Location: [1:Right, Posterior Lower Leg] [N/A:N/A] Wounding Event: [1:Trauma] [N/A:N/A] Primary Etiology: [1:Venous Leg Ulcer] [N/A:N/A] Comorbid History: [1:Cataracts, Chronic Obstructive N/A Pulmonary Disease (COPD), Congestive Heart Failure, Coronary Artery Disease, Hypertension, Osteoarthritis] Date Acquired: [1:01/30/2018] [N/A:N/A] Weeks of Treatment: [1:8] [N/A:N/A] Wound Status: [1:Healed - Epithelialized] [N/A:N/A] Measurements L x W x D [1:0x0x0] [N/A:N/A] (cm) Area (cm) : [1:0] [N/A:N/A] Volume (cm) : [1:0] [N/A:N/A] % Reduction in Area: [1:100.00%] [N/A:N/A] % Reduction in Volume: [1:100.00%] [N/A:N/A] Classification: [1:Full Thickness Without Exposed Support Structures] [N/A:N/A] Exudate Amount: [1:None Present] [N/A:N/A] Wound Margin: [1:Flat and Intact] [N/A:N/A] Granulation Amount: [1:None Present (0%)] [N/A:N/A] Necrotic Amount: [1:Large (67-100%)] [N/A:N/A] Necrotic Tissue: [1:Eschar] [N/A:N/A] Exposed Structures: [1:Fat Layer (Subcutaneous Tissue) Exposed: Yes Fascia: No Tendon: No Muscle: No Joint: No Bone: No] [N/A:N/A] Epithelialization: [1:Large (67-100%)] [N/A:N/A] Periwound Skin Texture: [1:Excoriation: No Induration: No Callus: No Crepitus: No] [N/A:N/A] Rash:  No Scarring: No Periwound Skin Moisture: Maceration: Yes N/A N/A Dry/Scaly: No Periwound Skin Color: Ecchymosis: Yes N/A N/A Atrophie Blanche: No Cyanosis: No Erythema: No Hemosiderin Staining: No Mottled: No Pallor: No Rubor: No Temperature: No Abnormality N/A N/A Tenderness on Palpation: Yes N/A N/A Wound Preparation: Ulcer Cleansing: N/A N/A Rinsed/Irrigated with Saline Topical Anesthetic Applied: Other: lidocaine 4% Treatment Notes Electronic Signature(s) Signed: 05/05/2018 6:13:14 PM By: Linton Ham MD Entered By: Linton Ham on 05/05/2018 17:13:04 Virginia [name] ([id number]) -------------------------------------------------------------------------------- Multi-Disciplinary Care Plan Details Patient Name: Virginia Harold A. Date of Service: 05/05/2018 3:15 PM Medical Record Number: [id number] Patient Account Number: [id number] Date of Birth/Sex: [birth year] (82 y.o. F) Treating RN: Virginia [name] Primary Care Martasia Talamante: Virginia [name] Other Clinician: Referring Paz Winsett: Virginia [name] Treating Karl Knarr/Extender: Virginia [name] in Treatment: 8 Active Inactive Electronic Signature(s) Signed: 05/06/2018 11:49:51 AM By: Gretta Cool, BSN, RN, CWS, Kim RN, BSN Entered By: Gretta Cool, BSN, RN, CWS, Virginia [name] on 05/06/2018 11:49:50 Virginia [name] ([id number]) -------------------------------------------------------------------------------- Pain Assessment Details Patient Name: Virginia Harold A. Date of Service: 05/05/2018 3:15 PM Medical Record Number: [id number] Patient Account Number: [id number] Date of Birth/Sex: [birth year] (82 y.o. F) Treating RN: Virginia [name] Primary Care Sheera Illingworth: Virginia [name] Other Clinician: Referring Sharlette Jansma: Virginia [name] Treating Ogle Hoeffner/Extender: Virginia [name] in Treatment: 8 Active Problems Location of Pain Severity and Description of Pain Patient Has Paino No Site Locations Pain Management and Medication Current Pain  Management: Electronic Signature(s) Signed: 05/06/2018 2:43:37 PM By: Alric Quan Entered By: Alric Quan on 05/05/2018 15:26:40 Virginia [name] ([id number]) -------------------------------------------------------------------------------- Patient/Caregiver Education Details Patient Name: Virginia Harold A. Date of Service: 05/05/2018 3:15 PM Medical Record Number: [id number] Patient Account Number: [id number] Date of Birth/Gender: [birth year] (82 y.o. F) Treating RN: Montey Hora Primary Care Physician: Virginia [name] Other Clinician: Referring Physician: Sherrie [name] Treating Physician/Extender: Virginia [name] in Treatment: 8 Education Assessment Education Provided To: Patient Education Topics Provided Basic Hygiene: Handouts: Other: care of newly healed ulcer site Methods: Explain/Verbal Responses: State content correctly Electronic Signature(s) Signed: 05/05/2018 5:11:38 PM By: Montey Hora Entered By: Montey Hora on 05/05/2018 16:25:58 Virginia [name]) -------------------------------------------------------------------------------- Wound Assessment Details Patient Name: Virginia Harold A. Date of Service: 05/05/2018 3:15 PM Medical Record Number: [id number] Patient Account Number: [id number] Date of Birth/Sex: [birth year] (82 y.o. F) Treating RN: Virginia [name] Primary Care Cadyn Fann: Virginia [name] Other Clinician: Referring Laekyn Rayos: Virginia [name] Treating Kuuipo Anzaldo/Extender: Virginia [name] in Treatment: 8 Wound Status Wound Number: 1 Primary Venous Leg Ulcer Etiology: Wound Location: Right, Posterior Lower Leg Wound Healed - Epithelialized Wounding Event: Trauma Status: Date Acquired: 01/30/2018 Comorbid Cataracts, Chronic  Obstructive Pulmonary Weeks Of Treatment: 8 History: Disease (COPD), Congestive Heart Failure, Clustered Wound: No Coronary Artery Disease, Hypertension, Osteoarthritis Photos Photo Uploaded By: Alric Quan on 05/06/2018 07:55:01 Wound Measurements Length: (cm) 0 Width: (cm) 0 Depth: (cm) 0 Area: (cm) 0 Volume: (cm) 0 % Reduction in Area: 100% % Reduction in Volume: 100% Epithelialization: Large (67-100%) Tunneling: No Undermining: No Wound Description Full Thickness Without Exposed Support Foul Classification: Structures Slou Wound Margin: Flat and Intact Exudate None Present Amount: Odor After Cleansing: No gh/Fibrino Yes Wound Bed Granulation Amount: None Present (0%) Exposed Structure Necrotic Amount: Large (67-100%) Fascia Exposed: No Necrotic Quality: Eschar Fat Layer (Subcutaneous Tissue) Exposed: Yes Tendon Exposed: No Muscle Exposed: No Joint Exposed: No Bone Exposed: No Periwound Skin Texture Texture Color No Abnormalities Noted: No No Abnormalities Noted: No Evarose, Altland Andrian A. ([id number]) Callus: No Atrophie Blanche: No Crepitus: No Cyanosis: No Excoriation: No Ecchymosis: Yes Induration: No Erythema: No Rash: No Hemosiderin Staining: No Scarring: No Mottled: No Pallor: No Moisture Rubor: No No Abnormalities Noted: No Dry / Scaly: No Temperature / Pain Maceration: Yes Temperature: No Abnormality Tenderness on Palpation: Yes Wound Preparation Ulcer Cleansing: Rinsed/Irrigated with Saline Topical Anesthetic Applied: Other: lidocaine 4%, Electronic Signature(s) Signed: 05/06/2018 2:15:42 PM By: Gretta Cool, BSN, RN, CWS, Kim RN, BSN Entered By: Gretta Cool, BSN, RN, CWS, Virginia [name] on 05/05/2018 15:46:03 Virginia [name] ([id number]) -------------------------------------------------------------------------------- Vitals Details Patient Name: Virginia Harold A. Date of Service: 05/05/2018 3:15 PM Medical Record Number: [id number] Patient Account Number: [id number] Date of Birth/Sex: 10-10-1936 (82 y.o. F) Treating RN: Virginia [name] Primary Care Arionne Iams: Virginia [name] Other Clinician: Referring Isel Skufca: Virginia [name] Treating Earlin Sweeden/Extender: Virginia [name] in Treatment: 8 Vital Signs Time Taken: 15:26 Temperature (F): 98.1 Height (in): 63 Pulse (bpm): 81 Weight (lbs): 196 Respiratory Rate (breaths/min): 16 Body Mass Index (BMI): 34.7 Blood Pressure (mmHg): 114/55 Reference Range: 80 - 120 mg / dl Electronic Signature(s) Signed: 05/06/2018 2:43:37 PM By: Alric Quan Entered By: Alric Quan on 05/05/2018 15:27:29

## 2018-05-07 NOTE — Progress Notes (Signed)
ERINNE, GILLENTINE ([id number]) Visit Report for 05/05/2018 HPI Details Patient Name: Virginia [name], Virginia A. Date of Service: 05/05/2018 3:15 PM Medical Record Number: [id number] Patient Account Number: [id number] Date of Birth/Sex: Oct 14, 1936 (82 y.o. F) Treating RN: Cornell Barman Primary Care Provider: Sherrie Mustache Other Clinician: Referring Provider: Sherrie Mustache Treating Provider/Extender: Tito Dine in Treatment: 8 History of Present Illness HPI Description: 03/10/18; is an 82 year old woman who is followed at [facility]. She is here for an open wound on the right posterior leg. Apparently this started in February with a fall. She was seen in the ER on 01/16/18 and the area was sutured. At some point the sutures were removed she was seen in her primary office on 02/09/18 at which time the wound had opened. It was felt to have cellulitis with redness drainage and tenderness and she was given doxycycline and Santyl was prescribed. they have been using Santyl and changing it daily. The patient has some form of dementia and I think is cared for by her brother. It is her daughter who brought her today. They tell us that she is sometimes reluctant to get out of bed or reluctant to go to appointments. patient is not a diabetic. ABIs in this clinic were 0.95 on the right The patient has dementia, congestive heart failure, coronary artery disease, hypertension and COPD. She apparently falls fairly frequently. The reason for this is not completely clear 03/17/18; small but punched out wound on the right posterior calf 03/31/18; small but punched out wound on the right posterior calf. We've been using Santyl. This is initially felt to be traumatic on a bed frame although nobody is completely certain about that they simply noticed blood on the bed. 04/02/18 on evaluation today patient appears to be doing a little bit worse when she was seen two days ago. Fortunately she does not seem to have  any evidence of severe infection although she does have some erythema surrounding the wound bed. Unfortunately she's also had increased discomfort and pain secondary to what her brother and sister feel is likely an infection. They state that she normally has a fairly high pain tolerance and has been really complaining of pain today. Patient does have dementia. 04/07/18; patient was in with erythema around the wound on 04/02/18. Culture that was done last time showed methicillin sensitive staph aureus which should've been covered by the trimethoprim/sulfamethoxazole we gave her. 04/14/18; the area on the right posterior calf which was initially a traumatic wound that was sutured but then the test. She is completing her antibiotics today. We've been using Santyl. Dimensions of the wound are smaller there is still some probing depth although very small wound. Somewhat firm around the wound which I think is probably scar tissue. She went for arterial studies. She was not able to tolerate ABIs. On the right she did have biphasic wave forms for the most part there was a 50-74% stenosis in the above-knee popliteal and a 30-49% stenosis in the common femoral artery. She has 2 vessel runoff E of the PTA and ATA. Peroneal artery was not visualized. The patient is not very ambulatory unsteady on her feet but I'm really not certain any of this represents claudication. 04/21/18; the patient has a small punched out area on the posterior calf. Covered in liquid slough nonviable subcutaneous tissue. This covered the tiny wound bed that is all reminiscent of this initial traumatic wound.we have been using Santyl 04/28/18; the area on the posterior calf is  smaller in fact very tiny and barely visible. Change the primary dressing to Iodosorb. I went over the arterialissues. It appears as though her wound is closing. She is not describing claudication although I don't think she is all that ambulatory. I would have a hard time  justifying and intervention in the SFA stenosis given the fact the wound is closing and the patient is not having any discomfort related to this. I have discussed this in detail with her brother 05/05/18; the area on the posterior calf is smaller. Surface eschar washed off the wound I did not see anything that wasn't epithelialized and we've declared this closed. This was initially a wound of uncertain etiology. She probably has some degree of chronic venous insufficiency, certainly some edema although her brother is not optimistic she will wear any form of stocking Electronic Signature(s) Signed: 05/05/2018 6:13:14 PM By: Linton Ham MD Virginia [name] ([id number]) Entered By: Linton Ham on 05/05/2018 17:14:08 Virginia [name] ([id number]) -------------------------------------------------------------------------------- Physical Exam Details Patient Name: Virginia Harold A. Date of Service: 05/05/2018 3:15 PM Medical Record Number: [id number] Patient Account Number: [id number] Date of Birth/Sex: 1936-11-22 (82 y.o. F) Treating RN: Cornell Barman Primary Care Provider: Sherrie Mustache Other Clinician: Referring Provider: Sherrie Mustache Treating Provider/Extender: Tito Dine in Treatment: 8 Constitutional Sitting or standing Blood Pressure is within target range for patient.. Pulse regular and within target range for patient.Marland Kitchen Respirations regular, non-labored and within target range.. Temperature is normal and within the target range for the patient.Marland Kitchen appears in no distress. Notes wound exam; a very small remaining open area last week had some surface eschar over this this was washed off there is no open areas seen. No evidence of surrounding infection. She has some edema in the leg probably some venous insufficiency and perhaps mild lymphedema. She would benefit from at least support stockings but her brother doesn't think she will be compliant Electronic Signature(s) Signed:  05/05/2018 6:13:14 PM By: Linton Ham MD Entered By: Linton Ham on 05/05/2018 17:15:12 Virginia [name] ([id number]) -------------------------------------------------------------------------------- Physician Orders Details Patient Name: Virginia Harold A. Date of Service: 05/05/2018 3:15 PM Medical Record Number: [id number] Patient Account Number: [id number] Date of Birth/Sex: 08/24/1936 (82 y.o. F) Treating RN: Cornell Barman Primary Care Provider: Sherrie Mustache Other Clinician: Referring Provider: Sherrie Mustache Treating Provider/Extender: Tito Dine in Treatment: 8 Verbal / Phone Orders: No Diagnosis Coding Discharge From [facility] o Discharge from [place] - treatment complete Electronic Signature(s) Signed: 05/05/2018 6:13:14 PM By: Linton Ham MD Signed: 05/06/2018 2:15:42 PM By: Gretta Cool, BSN, RN, CWS, Kim RN, BSN Entered By: Gretta Cool, BSN, RN, CWS, Kim on 05/05/2018 15:47:14 Virginia [name] ([id number]) -------------------------------------------------------------------------------- Problem List Details Patient Name: Virginia Harold A. Date of Service: 05/05/2018 3:15 PM Medical Record Number: [id number] Patient Account Number: [id number] Date of Birth/Sex: 1936-08-28 (82 y.o. 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 Date of Service: 05/05/2018 3:15 PM Medical Record Number: [id number] Patient Account  Number: [id number] Date of Birth/Sex: [birth year] (82 y.o. F) Treating RN: Cornell Barman Primary Care Provider: Sherrie Mustache Other Clinician: Referring Provider: Sherrie Mustache Treating Provider/Extender: Tito Dine in Treatment: 8 Subjective History of Present Illness (HPI) 03/10/18; is an 82 year old woman who is followed at [facility]. She is here for an open wound on the right posterior leg. Apparently this started in February with a fall. She was seen in the ER on 01/16/18 and the area was sutured. At some point the sutures were removed she was seen in her primary office on 02/09/18 at which time the wound had opened. It was felt to have cellulitis with redness drainage and tenderness and she was given doxycycline and Santyl was prescribed. they have been using Santyl and changing it daily. The patient has some form of dementia and I think is cared for by her brother. It is her daughter who brought her today. They tell us that she is sometimes reluctant to get out of bed or reluctant to go to appointments. patient is not a diabetic. ABIs in this clinic were 0.95 on the right The patient has dementia, congestive heart failure, coronary artery disease, hypertension and COPD. She apparently falls fairly frequently. The reason for this is not completely clear 03/17/18; small but punched out wound on the right posterior calf 03/31/18; small but punched out wound on the right posterior calf. We've been using Santyl. This is initially felt to be traumatic on a bed frame although nobody is completely certain about that they simply noticed blood on the bed. 04/02/18 on evaluation today patient appears to be doing a little bit worse when she was seen two  days ago. Fortunately she does not seem to have any evidence of severe infection although she does have some erythema surrounding the wound bed. Unfortunately she's also had increased discomfort and pain secondary to what her brother and sister feel is likely an infection. They state that she normally has a fairly high pain tolerance and has been really complaining of pain today. Patient does have dementia. 04/07/18; patient was in with erythema around the wound on 04/02/18. Culture that was done last time showed methicillin sensitive staph aureus which should've been covered by the trimethoprim/sulfamethoxazole we gave her. 04/14/18; the area on the right posterior calf which was initially a traumatic wound that was sutured but then the test. She is completing her antibiotics today. We've been using Santyl. Dimensions of the wound are smaller there is still some probing depth although very small wound. Somewhat firm around the wound which I think is probably scar tissue. She went for arterial studies. She was not able to tolerate ABIs. On the right she did have biphasic wave forms for the most part there was a 50-74% stenosis in the above-knee popliteal and a 30-49% stenosis in the common femoral artery. She has 2 vessel runoff E of the PTA and ATA. Peroneal artery was not visualized. The patient is not very ambulatory unsteady on her feet but I'm really not certain any of this represents claudication. 04/21/18; the patient has a small punched out area on the posterior calf. Covered in liquid slough nonviable subcutaneous tissue. This covered the tiny wound bed that is all reminiscent of this initial traumatic wound.we have been using Santyl 04/28/18; the area on the posterior calf is smaller in fact very tiny and barely visible. Change the primary dressing to Iodosorb. I went over the arterialissues. It appears as though her wound is closing.  She is not describing claudication although I don't think she  is all that ambulatory. I would have a hard time justifying and intervention in the SFA stenosis given the fact the wound is closing and the patient is not having any discomfort related to this. I have discussed this in detail with her brother 05/05/18; the area on the posterior calf is smaller. Surface eschar washed off the wound I did not see anything that wasn't epithelialized and we've declared this closed. This was initially a wound of uncertain etiology. She probably has some degree of chronic venous insufficiency, certainly some edema although her brother is not optimistic she will wear any form of stocking Virginia [name], Virginia A. ([id number]) Objective Constitutional Sitting or standing Blood Pressure is within target range for patient.. Pulse regular and within target range for patient.Marland Kitchen Respirations regular, non-labored and within target range.. Temperature is normal and within the target range for the patient.Marland Kitchen appears in no distress. Vitals Time Taken: 3:26 PM, Height: 63 in, Weight: 196 lbs, BMI: 34.7, Temperature: 98.1 F, Pulse: 81 bpm, Respiratory Rate: 16 breaths/min, Blood Pressure: 114/55 mmHg. General Notes: wound exam; a very small remaining open area last week had some surface eschar over this this was washed off there is no open areas seen. No evidence of surrounding infection. She has some edema in the leg probably some venous insufficiency and perhaps mild lymphedema. She would benefit from at least support stockings but her brother doesn't think she will be compliant Integumentary (Hair, Skin) Wound #1 status is Healed - Epithelialized. Original cause of wound was Trauma. The wound is located on the Right,Posterior Lower Leg. The wound measures 0cm length x 0cm width x 0cm depth; 0cm^2 area and 0cm^3 volume. There is Fat Layer (Subcutaneous Tissue) Exposed exposed. There is no tunneling or undermining noted. There is a none present amount of drainage noted. The wound margin is  flat and intact. There is no granulation within the wound bed. There is a large (67-100%) amount of necrotic tissue within the wound bed including Eschar. The periwound skin appearance exhibited: Maceration, Ecchymosis. The periwound skin appearance did not exhibit: Callus, Crepitus, Excoriation, Induration, Rash, Scarring, Dry/Scaly, Atrophie Blanche, Cyanosis, Hemosiderin Staining, Mottled, Pallor, Rubor, Erythema. Periwound temperature was noted as No Abnormality. The periwound has tenderness on palpation. Assessment Active Problems ICD-10 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scle Laceration without foreign body, right lower leg, subsequent encounter Plan Discharge From [facility] Services: Discharge from [facility] - treatment complete #1 the patient to be discharged from the wound care center #2 I talked to them about some form of stocking perhaps even just a support stocking. The brother is an optimistic she'll be compliant with this Virginia [name], Virginia [name] ([id number]) Electronic Signature(s) Signed: 05/05/2018 6:13:14 PM By: Linton Ham MD Entered By: Linton Ham on 05/05/2018 17:16:02 Virginia [name] ([id number]) -------------------------------------------------------------------------------- SuperBill Details Patient Name: Virginia Harold A. Date of Service: 05/05/2018 Medical Record Number: [id number] Patient Account Number: [id number] Date of Birth/Sex: 04/03/1936 (82 y.o. F) Treating RN: Cornell Barman Primary Care Provider: Sherrie Mustache Other Clinician: Referring Provider: Sherrie Mustache Treating Provider/Extender: Tito Dine in Treatment: 8 Diagnosis Coding ICD-10 Codes Code Description [phone] 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scle S81.811D Laceration without foreign body, right lower leg, subsequent encounter Facility Procedures CPT4 Code: [id number] Description: [phone] - WOUND CARE VISIT-LEV 2 EST  PT Modifier: Quantity: 1 Physician Procedures CPT4 Code: [id number] Description: 47425 - WC PHYS LEVEL 2 -  EST PT ICD-10 Diagnosis Description L97.213 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

## 2018-05-10 ENCOUNTER — Ambulatory Visit: Payer: Medicare Other | Admitting: Nurse Practitioner

## 2018-05-10 ENCOUNTER — Ambulatory Visit: Payer: Medicare Other

## 2018-05-24 ENCOUNTER — Ambulatory Visit (INDEPENDENT_AMBULATORY_CARE_PROVIDER_SITE_OTHER): Payer: Medicare Other | Admitting: Nurse Practitioner

## 2018-05-24 ENCOUNTER — Other Ambulatory Visit: Payer: Self-pay | Admitting: Nurse Practitioner

## 2018-05-24 ENCOUNTER — Ambulatory Visit: Payer: Medicare Other | Admitting: Adult Health

## 2018-05-24 ENCOUNTER — Ambulatory Visit: Payer: Medicare Other

## 2018-05-24 ENCOUNTER — Ambulatory Visit (INDEPENDENT_AMBULATORY_CARE_PROVIDER_SITE_OTHER): Payer: Medicare Other

## 2018-05-24 ENCOUNTER — Encounter: Payer: Self-pay | Admitting: Nurse Practitioner

## 2018-05-24 VITALS — BP 138/74 | HR 91 | Temp 98.1°F | Ht 63.0 in | Wt 196.0 lb

## 2018-05-24 DIAGNOSIS — W19XXXA Unspecified fall, initial encounter: Secondary | ICD-10-CM

## 2018-05-24 DIAGNOSIS — K219 Gastro-esophageal reflux disease without esophagitis: Secondary | ICD-10-CM

## 2018-05-24 DIAGNOSIS — I6529 Occlusion and stenosis of unspecified carotid artery: Secondary | ICD-10-CM | POA: Diagnosis not present

## 2018-05-24 DIAGNOSIS — I10 Essential (primary) hypertension: Secondary | ICD-10-CM | POA: Diagnosis not present

## 2018-05-24 DIAGNOSIS — Z0000 Encounter for general adult medical examination without abnormal findings: Secondary | ICD-10-CM

## 2018-05-24 DIAGNOSIS — F0391 Unspecified dementia with behavioral disturbance: Secondary | ICD-10-CM

## 2018-05-24 DIAGNOSIS — N183 Chronic kidney disease, stage 3 unspecified: Secondary | ICD-10-CM

## 2018-05-24 MED ORDER — ZOSTER VAC RECOMB ADJUVANTED 50 MCG/0.5ML IM SUSR: 0.5000 mL | Freq: Once | INTRAMUSCULAR | 1 refills | Status: AC

## 2018-05-24 MED ORDER — NEBIVOLOL HCL 10 MG PO TABS: 10.0000 mg | ORAL_TABLET | Freq: Every day | ORAL | 1 refills | Status: DC

## 2018-05-24 NOTE — Patient Instructions (Addendum)
To increase bystolic to 10 mg by mouth daily  To follow up with Gastroenterologist if having increase in symptoms.   Follow up in 3 months with Dr Mariea Clonts for routine follow up, sooner if needed. To continue to monitor blood pressure  DASH Eating Plan DASH stands for "Dietary Approaches to Stop Hypertension." The DASH eating plan is a healthy eating plan that has been shown to reduce high blood pressure (hypertension). It may also reduce your risk for type 2 diabetes, heart disease, and stroke. The DASH eating plan may also help with weight loss. What are tips for following this plan? General guidelines  Avoid eating more than 2,300 mg (milligrams) of salt (sodium) a day. If you have hypertension, you may need to reduce your sodium intake to 1,500 mg a day.  Limit alcohol intake to no more than 1 drink a day for nonpregnant women and 2 drinks a day for men. One drink equals 12 oz of beer, 5 oz of wine, or 1 oz of hard liquor.  Work with your health care provider to maintain a healthy body weight or to lose weight. Ask what an ideal weight is for you.  Get at least 30 minutes of exercise that causes your heart to beat faster (aerobic exercise) most days of the week. Activities may include walking, swimming, or biking.  Work with your health care provider or diet and nutrition specialist (dietitian) to adjust your eating plan to your individual calorie needs. Reading food labels  Check food labels for the amount of sodium per serving. Choose foods with less than 5 percent of the Daily Value of sodium. Generally, foods with less than 300 mg of sodium per serving fit into this eating plan.  To find whole grains, look for the word "whole" as the first word in the ingredient list. Shopping  Buy products labeled as "low-sodium" or "no salt added."  Buy fresh foods. Avoid canned foods and premade or frozen meals. Cooking  Avoid adding salt when cooking. Use salt-free seasonings or herbs instead  of table salt or sea salt. Check with your health care provider or pharmacist before using salt substitutes.  Do not fry foods. Cook foods using healthy methods such as baking, boiling, grilling, and broiling instead.  Cook with heart-healthy oils, such as olive, canola, soybean, or sunflower oil. Meal planning   Eat a balanced diet that includes: ? 5 or more servings of fruits and vegetables each day. At each meal, try to fill half of your plate with fruits and vegetables. ? Up to 6-8 servings of whole grains each day. ? Less than 6 oz of lean meat, poultry, or fish each day. A 3-oz serving of meat is about the same size as a deck of cards. One egg equals 1 oz. ? 2 servings of low-fat dairy each day. ? A serving of nuts, seeds, or beans 5 times each week. ? Heart-healthy fats. Healthy fats called Omega-3 fatty acids are found in foods such as flaxseeds and coldwater fish, like sardines, salmon, and mackerel.  Limit how much you eat of the following: ? Canned or prepackaged foods. ? Food that is high in trans fat, such as fried foods. ? Food that is high in saturated fat, such as fatty meat. ? Sweets, desserts, sugary drinks, and other foods with added sugar. ? Full-fat dairy products.  Do not salt foods before eating.  Try to eat at least 2 vegetarian meals each week.  Eat more home-cooked food and less  restaurant, buffet, and fast food.  When eating at a restaurant, ask that your food be prepared with less salt or no salt, if possible. What foods are recommended? The items listed may not be a complete list. Talk with your dietitian about what dietary choices are best for you. Grains Whole-grain or whole-wheat bread. Whole-grain or whole-wheat pasta. Brown rice. [place] Morrow. Bulgur. Whole-grain and low-sodium cereals. Pita bread. Low-fat, low-sodium crackers. Whole-wheat flour tortillas. Vegetables Fresh or frozen vegetables (raw, steamed, roasted, or grilled). Low-sodium or  reduced-sodium tomato and vegetable juice. Low-sodium or reduced-sodium tomato sauce and tomato paste. Low-sodium or reduced-sodium canned vegetables. Fruits All fresh, dried, or frozen fruit. Canned fruit in natural juice (without added sugar). Meat and other protein foods Skinless chicken or [place]. Ground chicken or [place]. Pork with fat trimmed off. Fish and seafood. Egg whites. Dried beans, peas, or lentils. Unsalted nuts, nut butters, and seeds. Unsalted canned beans. Lean cuts of beef with fat trimmed off. Low-sodium, lean deli meat. Dairy Low-fat (1%) or fat-free (skim) milk. Fat-free, low-fat, or reduced-fat cheeses. Nonfat, low-sodium ricotta or cottage cheese. Low-fat or nonfat yogurt. Low-fat, low-sodium cheese. Fats and oils Soft margarine without trans fats. Vegetable oil. Low-fat, reduced-fat, or light mayonnaise and salad dressings (reduced-sodium). Canola, safflower, olive, soybean, and sunflower oils. Avocado. Seasoning and other foods Herbs. Spices. Seasoning mixes without salt. Unsalted popcorn and pretzels. Fat-free sweets. What foods are not recommended? The items listed may not be a complete list. Talk with your dietitian about what dietary choices are best for you. Grains Baked goods made with fat, such as croissants, muffins, or some breads. Dry pasta or rice meal packs. Vegetables Creamed or fried vegetables. Vegetables in a cheese sauce. Regular canned vegetables (not low-sodium or reduced-sodium). Regular canned tomato sauce and paste (not low-sodium or reduced-sodium). Regular tomato and vegetable juice (not low-sodium or reduced-sodium). Angie Fava. Olives. Fruits Canned fruit in a light or heavy syrup. Fried fruit. Fruit in cream or butter sauce. Meat and other protein foods Fatty cuts of meat. Ribs. Fried meat. Berniece Salines. Sausage. Bologna and other processed lunch meats. Salami. Fatback. Hotdogs. Bratwurst. Salted nuts and seeds. Canned beans with added salt. Canned or  smoked fish. Whole eggs or egg yolks. Chicken or [place] with skin. Dairy Whole or 2% milk, cream, and half-and-half. Whole or full-fat cream cheese. Whole-fat or sweetened yogurt. Full-fat cheese. Nondairy creamers. Whipped toppings. Processed cheese and cheese spreads. Fats and oils Butter. Stick margarine. Lard. Shortening. Ghee. Bacon fat. Tropical oils, such as coconut, palm kernel, or palm oil. Seasoning and other foods Salted popcorn and pretzels. Onion salt, garlic salt, seasoned salt, table salt, and sea salt. Worcestershire sauce. Tartar sauce. Barbecue sauce. Teriyaki sauce. Soy sauce, including reduced-sodium. Steak sauce. Canned and packaged gravies. Fish sauce. Oyster sauce. Cocktail sauce. Horseradish that you find on the shelf. Ketchup. Mustard. Meat flavorings and tenderizers. Bouillon cubes. Hot sauce and Tabasco sauce. Premade or packaged marinades. Premade or packaged taco seasonings. Relishes. Regular salad dressings. Where to find more information:  National Heart, Lung, and [place]: https://wilson-eaton.com/  American Heart Association: www.heart.org Summary  The DASH eating plan is a healthy eating plan that has been shown to reduce high blood pressure (hypertension). It may also reduce your risk for type 2 diabetes, heart disease, and stroke.  With the DASH eating plan, you should limit salt (sodium) intake to 2,300 mg a day. If you have hypertension, you may need to reduce your sodium intake to 1,500 mg a day.  When on the DASH eating plan, aim to eat more fresh fruits and vegetables, whole grains, lean proteins, low-fat dairy, and heart-healthy fats.  Work with your health care provider or diet and nutrition specialist (dietitian) to adjust your eating plan to your individual calorie needs. This information is not intended to replace advice given to you by your health care provider. Make sure you discuss any questions you have with your health care provider. Document  Released: 11/06/2011 Document Revised: 11/10/2016 Document Reviewed: 11/10/2016 Elsevier Interactive Patient Education  [employer].

## 2018-05-24 NOTE — Progress Notes (Signed)
Subjective:   Virginia [name] is a 82 y.o. female who presents for Medicare Annual (Subsequent) preventive examination   Last AWV-03/20/2017    Objective:     Vitals: BP 138/74 (BP Location: Left Arm, Patient Position: Sitting)   Pulse 91   Temp 98.1 F (36.7 C) (Oral)   Ht 5\' 3"  (1.6 m)   Wt 196 lb (88.9 kg)   SpO2 92%   BMI 34.72 kg/m   Body mass index is 34.72 kg/m.  Advanced Directives 05/24/2018 09/08/2017 08/06/2017 07/06/2017 03/30/2017 02/16/2017 02/01/2017  Does Patient Have a Medical Advance Directive? No No Yes Yes Yes Yes No  Type of Advance Directive - Public librarian;Living will [place];Living will [place];Living will - -  Does patient want to make changes to medical advance directive? - - - - No - Patient declined - -  Copy of [place] in Chart? - - No - copy requested No - copy requested No - copy requested No - copy requested -  Would patient like information on creating a medical advance directive? Yes (MAU/Ambulatory/Procedural Areas - Information given) - - - - - -    Tobacco Social History   Tobacco Use  Smoking Status Current Some Day Smoker  . Packs/day: 0.25  . Years: 20.00  . Pack years: 5.00  . Types: Cigarettes  . Last attempt to quit: 11/30/1994  . Years since quitting: 23.4  Smokeless Tobacco Never Used  Tobacco Comment   1 cigarette per day average     Ready to quit: Not Answered Counseling given: Not Answered Comment: 1 cigarette per day average   Clinical Intake:              How often do you need to have someone help you when you read instructions, pamphlets, or other written materials from your doctor or pharmacy?: 1 - Never What is the last grade level you completed in school?: [place]?: No  Information entered by :: Tyson Dense, RN  Past Medical History:  Diagnosis Date  . Arthritis   . Carotid artery disease ([place])    a. s/p  L CEA. b. followed by [facility].  . Chronic diastolic CHF (congestive heart failure) ([place])   . CKD (chronic kidney disease), stage III ([place])   . COPD (chronic obstructive pulmonary disease) ([place])   . Diverticulosis   . Fall at home Sept. 2013  . Hyperlipidemia   . Hypertension   . Internal hemorrhoid   . Memory disorder 10/24/2014   Past Surgical History:  Procedure Laterality Date  . APPENDECTOMY    . BREAST REDUCTION SURGERY    . CAROTID ENDARTERECTOMY     left CEA  . CATARACT EXTRACTION Bilateral   . COLONOSCOPY  2015  . EYE SURGERY     cataracts removed, bilaterally   . skin cancer removal    . TONSILLECTOMY     Family History  Problem Relation Age of Onset  . Heart disease Father        Before age 85  . Hypertension Father   . Heart attack Father   . Cancer Mother 1       Brain  . Dementia Brother   . Cancer Maternal Aunt 61       breast cancer  . Diabetes Maternal Aunt   . Heart failure Maternal Grandmother   . Diabetes Maternal Grandmother   . Stroke Neg Hx  Social History   Socioeconomic History  . Marital status: Divorced    Spouse name: Not on file  . Number of children: 2  . Years of education: 7  . Highest education level: Not on file  Occupational History  . Occupation: retired  Scientific laboratory technician  . Financial resource strain: Not hard at all  . Food insecurity:    Worry: Never true    Inability: Never true  . Transportation needs:    Medical: No    Non-medical: No  Tobacco Use  . Smoking status: Current Some Day Smoker    Packs/day: 0.25    Years: 20.00    Pack years: 5.00    Types: Cigarettes    Last attempt to quit: 11/30/1994    Years since quitting: 23.4  . Smokeless tobacco: Never Used  . Tobacco comment: 1 cigarette per day average  Substance and Sexual Activity  . Alcohol use: No    Alcohol/week: 0.0 oz  . Drug use: No  . Sexual activity: Never    Birth control/protection: Post-menopausal  Lifestyle  . Physical activity:    Days  per week: 1 day    Minutes per session: 40 min  . Stress: Only a little  Relationships  . Social connections:    Talks on phone: More than three times a week    Gets together: More than three times a week    Attends religious service: Never    Active member of club or organization: Yes    Attends meetings of clubs or organizations: 1 to 4 times per year    Relationship status: Divorced  Other Topics Concern  . Not on file  Social History Narrative   04/05/18 lives with son and brother, [name]   Diet:       Do you drink/eat things with caffeine: yes      Marital Status: Divorced   What Year Married:1959      Do you live in a house, apartment, Assisted Living, Condo, trailer? House      Is it one or more stories? 2 stories      How many persons live in your home? Just Patient      Do you have any pets in your home? 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 Very Little   Type and how often? Walk      Do you have a living will? None   DNR?   Discuss one? No      Do you have signed POA/HPOA forms?      Patient drinks 1 cup of caffeine daily.   Patient is right handed.    Outpatient Encounter Medications as of 05/24/2018  Medication Sig  . albuterol (PROVENTIL) (2.5 MG/3ML) 0.083% nebulizer solution Take 6 mLs (5 mg total) by nebulization every 4 (four) hours as needed for wheezing or shortness of breath.  . ARTIFICIAL TEAR OP Place 2 drops into both eyes daily.   Marland Kitchen aspirin EC 81 MG tablet Take 81 mg by mouth every evening.  Marland Kitchen atorvastatin (LIPITOR) 20 MG tablet take 1 tablet by mouth once daily  . BYSTOLIC 5 MG tablet TAKE 1 TABLET BY MOUTH ONCE DAILY  . Calcium Carbonate-Vitamin D (CALCIUM-D PO) Take 1 tablet by mouth every evening.  . collagenase (SANTYL) ointment Apply 1 application topically daily.  . diclofenac sodium (VOLTAREN) 1 % GEL Apply 1 application topically 2 (two) times daily as  needed (shoulder and knee pain).  Marland Kitchen donepezil  (ARICEPT) 10 MG tablet Take 1 tablet (10 mg total) by mouth daily.  . fluticasone (FLONASE) 50 MCG/ACT nasal spray Place 2 sprays into both nostrils daily.  . fluticasone furoate-vilanterol (BREO ELLIPTA) 100-25 MCG/INH AEPB Inhale 1 puff into the lungs daily.  . furosemide (LASIX) 20 MG tablet Take 1 tablet daily, take 1 tablet daily as needed, for weight gain of 3-5 lbs  . ipratropium (ATROVENT) 0.06 % nasal spray USE 2 SPRAYS IN EACH NOSTRIL TWICE DAILY  . ipratropium-albuterol (DUONEB) 0.5-2.5 (3) MG/3ML SOLN inhale contents of 1 vial in nebulizer every 6 hours  . memantine (NAMENDA) 10 MG tablet Take 1 tablet (10 mg total) by mouth 2 (two) times daily.  . Multiple Vitamins-Minerals (PRESERVISION AREDS 2) CAPS Take 1 capsule by mouth 2 (two) times daily.   . ondansetron (ZOFRAN ODT) 4 MG disintegrating tablet Take 1 tablet (4 mg total) by mouth every 8 (eight) hours as needed for nausea or vomiting.  . pantoprazole (PROTONIX) 40 MG tablet Take 1 tablet (40 mg total) by mouth daily.  . Probiotic Product (PROBIOTIC DAILY PO) Take 1 capsule by mouth daily.  . ranitidine (RANITIDINE 150 MAX STRENGTH) 150 MG tablet Take 150 mg by mouth every morning.  . ranitidine (ZANTAC) 150 MG tablet TAKE 1 TABLET BY MOUTH AT BEDTIME (Patient taking differently: TAKE 1 TABLET BY MOUTH AM)  . VAYACOG 100-19.5-6.5 MG CAPS take 1 capsule by mouth once daily  . Zoster Vaccine Adjuvanted [facility]) injection Inject 0.5 mLs into the muscle once for 1 dose.  . [DISCONTINUED] Zoster Vaccine Adjuvanted [facility]) injection Inject 0.5 mLs into the muscle once.  Marland Kitchen azelastine (ASTELIN) 0.1 % nasal spray Place 2 sprays into both nostrils 2 (two) times daily. Use in each nostril as directed (Patient not taking: Reported on 05/24/2018)  . Carbinoxamine Maleate ER [facility]) 4 MG/5ML SUER Take 7.5 mLs by mouth every 12 (twelve) hours as needed. (Patient not taking: Reported on 05/24/2018)   No facility-administered encounter  medications on file as of 05/24/2018.     Activities of Daily Living In your present state of health, do you have any difficulty performing the following activities: 05/24/2018  Hearing? N  Vision? N  Difficulty concentrating or making decisions? Y  Walking or climbing stairs? Y  Dressing or bathing? Y  Doing errands, shopping? Y  Preparing Food and eating ? Y  Using the Toilet? Y  In the past six months, have you accidently leaked urine? Y  Do you have problems with loss of bowel control? Y  Managing your Medications? Y  Managing your Finances? Y  Housekeeping or managing your Housekeeping? Y  Some recent data might be hidden    Patient Care Team: Lauree Chandler, NP as PCP - General (Nurse Practitioner) Fay Records, MD as Consulting Physician (Cardiology) Tanda Rockers, MD as Consulting Physician (Pulmonary Disease)    Assessment:   This is a routine wellness examination for [place].  Exercise Activities and Dietary recommendations Current Exercise Habits: Structured exercise class, Type of exercise: Other - see comments(physical therapy), Time (Minutes): 40, Frequency (Times/Week): 1, Weekly Exercise (Minutes/Week): 40, Intensity: Mild, Exercise limited by: neurologic condition(s)  Goals    None      Fall Risk Fall Risk  05/24/2018 04/05/2018 03/08/2018 02/09/2018 01/25/2018  Falls in the past year? Yes Yes Yes No Yes  Number falls in past yr: 1 1 2  or more - 1  Injury  with Fall? No Yes Yes - Yes  Comment - wound on right leg - - -  Risk for fall due to : - Impaired balance/gait;Impaired mobility - - -   Is the patient's home free of loose throw rugs in walkways, pet beds, electrical cords, etc?   yes      Grab bars in the bathroom? yes      Handrails on the stairs?   yes      Adequate lighting?   yes  Timed Get Up and Go performed: 20 seconds  Depression Screen PHQ 2/9 Scores 05/24/2018 03/30/2017 05/23/2016 01/17/2016  PHQ - 2 Score 0 0 0 0     Cognitive Function  completed within last year MMSE - Mini Mental State Exam 04/05/2018 11/19/2017 05/13/2017 03/30/2017 11/12/2016  Orientation to time 2 4 0 3 2  Orientation to Place 3 3 4 3 4   Registration 3 3 3 3 3   Attention/ Calculation 0 1 5 4 2   Recall 0 0 0 0 0  Language- name 2 objects 2 2 2 2 2   Language- repeat 0 0 1 1 1   Language- follow 3 step command 3 3 3 1 3   Language- read & follow direction 1 1 1 1 1   Write a sentence 1 1 1 1 1   Copy design 0 0 1 1 1   Total score 15 18 21 20 20         Immunization History  Administered Date(s) Administered  . Influenza Split 08/01/2014  . Influenza, High Dose Seasonal PF 09/08/2017  . Influenza,inj,Quad PF,6+ Mos 08/16/2015, 08/15/2016  . Pneumococcal Conjugate-13 03/30/2017  . Pneumococcal-Unspecified 08/01/2014  . Tdap 01/16/2018    Qualifies for Shingles Vaccine? Yes, educated and ordered to pharmacy  Screening Tests Health Maintenance  Topic Date Due  . INFLUENZA VACCINE  07/01/2018  . TETANUS/TDAP  01/17/2028  . DEXA SCAN  Completed  . PNA vac Low Risk Adult  Completed    Cancer Screenings: Lung: Low Dose CT Chest recommended if Age 22-80 years, 30 pack-year currently smoking OR have quit w/in 15years. Patient does not qualify. Breast:  Up to date on Mammogram? Yes   Up to date of Bone Density/Dexa? Yes Colorectal: up to date  Additional Screenings:  Hepatitis C Screening: declined     Plan:    I have personally reviewed and addressed the Medicare Annual Wellness questionnaire and have noted the following in the patient's chart:  A. Medical and social history B. Use of alcohol, tobacco or illicit drugs  C. Current medications and supplements D. Functional ability and status E.  Nutritional status F.  Physical activity G. Advance directives H. List of other physicians I.  Hospitalizations, surgeries, and ER visits in previous 12 months J.  [place] to include hearing, vision, cognitive, depression L. Referrals  and appointments - none  In addition, I have reviewed and discussed with patient certain preventive protocols, quality metrics, and best practice recommendations. A written personalized care plan for preventive services as well as general preventive health recommendations were provided to patient.  See attached scanned questionnaire for additional information.   Signed,   Tyson Dense, RN Nurse Health Advisor  Patient Concerns: [place] yesterday and is achy, especially bruising on right left.

## 2018-05-24 NOTE — Patient Instructions (Signed)
Virginia [name] , Thank you for taking time to come for your Medicare Wellness Visit. I appreciate your ongoing commitment to your health goals. Please review the following plan we discussed and let me know if I can assist you in the future.   Screening recommendations/referrals: Colonoscopy excluded, over age 80 Mammogram excluded, over age 70 Bone Density up to date Recommended yearly ophthalmology/optometry visit for glaucoma screening and checkup Recommended yearly dental visit for hygiene and checkup  Vaccinations: Influenza vaccine up to date, due 2019 fall season Pneumococcal vaccine up to date, completed Tdap vaccine up to date, due 01/17/2028 Shingles vaccine due, ordered to pharmacy    Advanced directives: Advance directive discussed with you today. I have provided a copy for you to complete at home and have notarized. Once this is complete please bring a copy in to our office so we can scan it into your chart.  Conditions/risks identified: none  Next appointment: Tyson Dense, RN 05/30/2019 @ 1:30pm   Preventive Care 65 Years and Older, Female Preventive care refers to lifestyle choices and visits with your health care provider that can promote health and wellness. What does preventive care include?  A yearly physical exam. This is also called an annual well check.  Dental exams once or twice a year.  Routine eye exams. Ask your health care provider how often you should have your eyes checked.  Personal lifestyle choices, including:  Daily care of your teeth and gums.  Regular physical activity.  Eating a healthy diet.  Avoiding tobacco and drug use.  Limiting alcohol use.  Practicing safe sex.  Taking low-dose aspirin every day.  Taking vitamin and mineral supplements as recommended by your health care provider. What happens during an annual well check? The services and screenings done by your health care provider during your annual well check will depend on  your age, overall health, lifestyle risk factors, and family history of disease. Counseling  Your health care provider may ask you questions about your:  Alcohol use.  Tobacco use.  Drug use.  Emotional well-being.  Home and relationship well-being.  Sexual activity.  Eating habits.  History of falls.  Memory and ability to understand (cognition).  Work and work Statistician.  Reproductive health. Screening  You may have the following tests or measurements:  Height, weight, and BMI.  Blood pressure.  Lipid and cholesterol levels. These may be checked every 5 years, or more frequently if you are over 61 years old.  Skin check.  Lung cancer screening. You may have this screening every year starting at age 57 if you have a 30-pack-year history of smoking and currently smoke or have quit within the past 15 years.  Fecal occult blood test (FOBT) of the stool. You may have this test every year starting at age 73.  Flexible sigmoidoscopy or colonoscopy. You may have a sigmoidoscopy every 5 years or a colonoscopy every 10 years starting at age 67.  Hepatitis C blood test.  Hepatitis B blood test.  Sexually transmitted disease (STD) testing.  Diabetes screening. This is done by checking your blood sugar (glucose) after you have not eaten for a while (fasting). You may have this done every 1-3 years.  Bone density scan. This is done to screen for osteoporosis. You may have this done starting at age 23.  Mammogram. This may be done every 1-2 years. Talk to your health care provider about how often you should have regular mammograms. Talk with your health care provider about  your test results, treatment options, and if necessary, the need for more tests. Vaccines  Your health care provider may recommend certain vaccines, such as:  Influenza vaccine. This is recommended every year.  Tetanus, diphtheria, and acellular pertussis (Tdap, Td) vaccine. You may need a Td booster  every 10 years.  Zoster vaccine. You may need this after age 18.  Pneumococcal 13-valent conjugate (PCV13) vaccine. One dose is recommended after age 28.  Pneumococcal polysaccharide (PPSV23) vaccine. One dose is recommended after age 62. Talk to your health care provider about which screenings and vaccines you need and how often you need them. This information is not intended to replace advice given to you by your health care provider. Make sure you discuss any questions you have with your health care provider. Document Released: 12/14/2015 Document Revised: 08/06/2016 Document Reviewed: 09/18/2015 Elsevier Interactive Patient Education  2017 [place] Prevention in the Home Falls can cause injuries. They can happen to people of all ages. There are many things you can do to make your home safe and to help prevent falls. What can I do on the outside of my home?  Regularly fix the edges of walkways and driveways and fix any cracks.  Remove anything that might make you trip as you walk through a door, such as a raised step or threshold.  Trim any bushes or trees on the path to your home.  Use bright outdoor lighting.  Clear any walking paths of anything that might make someone trip, such as rocks or tools.  Regularly check to see if handrails are loose or broken. Make sure that both sides of any steps have handrails.  Any raised decks and porches should have guardrails on the edges.  Have any leaves, snow, or ice cleared regularly.  Use sand or salt on walking paths during winter.  Clean up any spills in your garage right away. This includes oil or grease spills. What can I do in the bathroom?  Use night lights.  Install grab bars by the toilet and in the tub and shower. Do not use towel bars as grab bars.  Use non-skid mats or decals in the tub or shower.  If you need to sit down in the shower, use a plastic, non-slip stool.  Keep the floor dry. Clean up any  water that spills on the floor as soon as it happens.  Remove soap buildup in the tub or shower regularly.  Attach bath mats securely with double-sided non-slip rug tape.  Do not have throw rugs and other things on the floor that can make you trip. What can I do in the bedroom?  Use night lights.  Make sure that you have a light by your bed that is easy to reach.  Do not use any sheets or blankets that are too big for your bed. They should not hang down onto the floor.  Have a firm chair that has side arms. You can use this for support while you get dressed.  Do not have throw rugs and other things on the floor that can make you trip. What can I do in the kitchen?  Clean up any spills right away.  Avoid walking on wet floors.  Keep items that you use a lot in easy-to-reach places.  If you need to reach something above you, use a strong step stool that has a grab bar.  Keep electrical cords out of the way.  Do not use floor polish or wax  that makes floors slippery. If you must use wax, use non-skid floor wax.  Do not have throw rugs and other things on the floor that can make you trip. What can I do with my stairs?  Do not leave any items on the stairs.  Make sure that there are handrails on both sides of the stairs and use them. Fix handrails that are broken or loose. Make sure that handrails are as long as the stairways.  Check any carpeting to make sure that it is firmly attached to the stairs. Fix any carpet that is loose or worn.  Avoid having throw rugs at the top or bottom of the stairs. If you do have throw rugs, attach them to the floor with carpet tape.  Make sure that you have a light switch at the top of the stairs and the bottom of the stairs. If you do not have them, ask someone to add them for you. What else can I do to help prevent falls?  Wear shoes that:  Do not have high heels.  Have rubber bottoms.  Are comfortable and fit you well.  Are closed  at the toe. Do not wear sandals.  If you use a stepladder:  Make sure that it is fully opened. Do not climb a closed stepladder.  Make sure that both sides of the stepladder are locked into place.  Ask someone to hold it for you, if possible.  Clearly mark and make sure that you can see:  Any grab bars or handrails.  First and last steps.  Where the edge of each step is.  Use tools that help you move around (mobility aids) if they are needed. These include:  Canes.  Walkers.  Scooters.  Crutches.  Turn on the lights when you go into a dark area. Replace any light bulbs as soon as they burn out.  Set up your furniture so you have a clear path. Avoid moving your furniture around.  If any of your floors are uneven, fix them.  If there are any pets around you, be aware of where they are.  Review your medicines with your doctor. Some medicines can make you feel dizzy. This can increase your chance of falling. Ask your doctor what other things that you can do to help prevent falls. This information is not intended to replace advice given to you by your health care provider. Make sure you discuss any questions you have with your health care provider. Document Released: 09/13/2009 Document Revised: 04/24/2016 Document Reviewed: 12/22/2014 Elsevier Interactive Patient Education  2017 [employer].

## 2018-05-24 NOTE — Progress Notes (Signed)
Careteam: Patient Care Team: Lauree Chandler, NP as PCP - General (Nurse Practitioner) Fay Records, MD as Consulting Physician (Cardiology) Tanda Rockers, MD as Consulting Physician (Pulmonary Disease)  Advanced Directive information Does Patient Have a Medical Advance Directive?: Yes, Type of Advance Directive: [place];Living will  Allergies  Allergen Reactions  . Lidocaine Anaphylaxis, Swelling, Rash and Other (See Comments)    Any of the " [facility] "  . Other Other (See Comments)    All drugs that end in "cane'-  novacane etc. Daughter states patient almost died when she had it when she was born    Chief Complaint  Patient presents with  . Medical Management of Chronic Issues    Pt is being seen for a 6 month routine visit.   . ACP    Copies of HCPOA and Living Will have been requested  . Other    forms from [employer] to be addressed     HPI: Patient is a 82 y.o. female seen in the office today for routine follow up.  Feel 3 days ago, daughter told her to be careful but she went down and daughter went down with her.  Said she was hurting "hard" but it went away.  Has been sleeping more and sore, took tylenol which was helpful. Will not use the walker or wheelchair and unsteady.  Daughter states she "high risk for fall" has had multiple falls.  Bruising noted to ankle and soreness. Pt reports she has not hurt nearly has bad as what she thought.   Pt was referred to wound care after nonhealing right lower leg wound but this has now healed.   Blood pressure has been running high, daughter reports it was 156/109, will be high for a few days and then be normal. 130/90,146/101, 269/48,546/27 Taking bystolic 5 mg by mouth daily, lasix 20 mg by mouth daily.  Eating a lot of foods high in sodium.   MMSE 15/30 in May 2019  Discussed palliative care- daughter is interested care but pts brother and son are against it because they had a bad  experience with SW in the past.   GERD- using Protonix and ranitidine- daughter reports she is burping. Still having some ingestion. Diet contributing to symptoms and they have changed some but still room for improvement. No recent GI follow up.   Review of Systems:  Review of Systems  Unable to perform ROS: Dementia   Past Medical History:  Diagnosis Date  . Arthritis   . Carotid artery disease ([place])    a. s/p L CEA. b. followed by [facility].  . Chronic diastolic CHF (congestive heart failure) ([place])   . CKD (chronic kidney disease), stage III ([place])   . COPD (chronic obstructive pulmonary disease) ([place])   . Diverticulosis   . Fall at home Sept. 2013  . Hyperlipidemia   . Hypertension   . Internal hemorrhoid   . Memory disorder 10/24/2014   Past Surgical History:  Procedure Laterality Date  . APPENDECTOMY    . BREAST REDUCTION SURGERY    . CAROTID ENDARTERECTOMY     left CEA  . CATARACT EXTRACTION Bilateral   . COLONOSCOPY  2015  . EYE SURGERY     cataracts removed, bilaterally   . skin cancer removal    . TONSILLECTOMY     Social History:   reports that she has been smoking cigarettes.  She has a 5.00 pack-year smoking history. She has  never used smokeless tobacco. She reports that she does not drink alcohol or use drugs.  Family History  Problem Relation Age of Onset  . Heart disease Father        Before age 55  . Hypertension Father   . Heart attack Father   . Cancer Mother 90       Brain  . Dementia Brother   . Cancer Maternal Aunt 61       breast cancer  . Diabetes Maternal Aunt   . Heart failure Maternal Grandmother   . Diabetes Maternal Grandmother   . Stroke Neg Hx     Medications: Patient's Medications  New Prescriptions   No medications on file  Previous Medications   ALBUTEROL (PROVENTIL) (2.5 MG/3ML) 0.083% NEBULIZER SOLUTION    Take 6 mLs (5 mg total) by nebulization every 4 (four) hours as needed for wheezing or shortness of breath.   ARTIFICIAL  TEAR OP    Place 2 drops into both eyes daily.    ASPIRIN EC 81 MG TABLET    Take 81 mg by mouth every evening.   ATORVASTATIN (LIPITOR) 20 MG TABLET    take 1 tablet by mouth once daily   AZELASTINE (ASTELIN) 0.1 % NASAL SPRAY    Place 2 sprays into both nostrils 2 (two) times daily. Use in each nostril as directed   BYSTOLIC 5 MG TABLET    TAKE 1 TABLET BY MOUTH ONCE DAILY   CALCIUM CARBONATE-VITAMIN D (CALCIUM-D PO)    Take 1 tablet by mouth every evening.   CARBINOXAMINE MALEATE ER [facility] ER) 4 MG/5ML SUER    Take 7.5 mLs by mouth every 12 (twelve) hours as needed.   COLLAGENASE (SANTYL) OINTMENT    Apply 1 application topically daily.   DICLOFENAC SODIUM (VOLTAREN) 1 % GEL    Apply 1 application topically 2 (two) times daily as needed (shoulder and knee pain).   DONEPEZIL (ARICEPT) 10 MG TABLET    Take 1 tablet (10 mg total) by mouth daily.   FLUTICASONE (FLONASE) 50 MCG/ACT NASAL SPRAY    Place 2 sprays into both nostrils daily.   FLUTICASONE FUROATE-VILANTEROL (BREO ELLIPTA) 100-25 MCG/INH AEPB    Inhale 1 puff into the lungs daily.   FUROSEMIDE (LASIX) 20 MG TABLET    Take 1 tablet daily, take 1 tablet daily as needed, for weight gain of 3-5 lbs   IPRATROPIUM (ATROVENT) 0.06 % NASAL SPRAY    USE 2 SPRAYS IN EACH NOSTRIL TWICE DAILY   IPRATROPIUM-ALBUTEROL (DUONEB) 0.5-2.5 (3) MG/3ML SOLN    inhale contents of 1 vial in nebulizer every 6 hours   MEMANTINE (NAMENDA) 10 MG TABLET    Take 1 tablet (10 mg total) by mouth 2 (two) times daily.   MULTIPLE VITAMINS-MINERALS (PRESERVISION AREDS 2) CAPS    Take 1 capsule by mouth 2 (two) times daily.    ONDANSETRON (ZOFRAN ODT) 4 MG DISINTEGRATING TABLET    Take 1 tablet (4 mg total) by mouth every 8 (eight) hours as needed for nausea or vomiting.   PANTOPRAZOLE (PROTONIX) 40 MG TABLET    Take 1 tablet (40 mg total) by mouth daily.   PROBIOTIC PRODUCT (PROBIOTIC DAILY PO)    Take 1 capsule by mouth daily.   RANITIDINE (RANITIDINE 150 MAX  STRENGTH) 150 MG TABLET    Take 150 mg by mouth every morning.   RANITIDINE (ZANTAC) 150 MG TABLET    TAKE 1 TABLET BY MOUTH AT BEDTIME   VAYACOG  100-19.5-6.5 MG CAPS    take 1 capsule by mouth once daily   ZOSTER VACCINE ADJUVANTED (SHINGRIX) INJECTION    Inject 0.5 mLs into the muscle once for 1 dose.  Modified Medications   No medications on file  Discontinued Medications   No medications on file     Physical Exam:  Vitals:   05/24/18 1452  BP: 138/74  Pulse: 91  Temp: 98.1 F (36.7 C)  TempSrc: Oral  SpO2: 94%  Weight: 196 lb (88.9 kg)  Height: 5\' 3"  (1.6 m)   Body mass index is 34.72 kg/m.  Physical Exam  Constitutional: She appears well-developed and well-nourished.  HENT:  Head: Normocephalic and atraumatic.  Mouth/Throat: No oropharyngeal exudate.  Eyes: Pupils are equal, round, and reactive to light. Conjunctivae and EOM are normal.  Neck: Normal range of motion. Neck supple.  Cardiovascular: Normal rate, regular rhythm and normal heart sounds.  Pulmonary/Chest: Effort normal and breath sounds normal. She has no wheezes.  Abdominal: Soft. Bowel sounds are normal.  Musculoskeletal: She exhibits edema (1+ to right).  varicosities to lower extremities  Neurological: She is alert.  Skin: Skin is warm and dry.  Bruising noted to right ankle after fall  Psychiatric: Her affect is blunt. Cognition and memory are impaired. She exhibits abnormal remote memory.    Labs reviewed: Basic Metabolic Panel: Recent Labs    08/13/17 1335 11/27/17 1221 12/10/17 1349 01/08/18 1150  NA  --  141 143 144  K  --  4.3 4.7 3.9  CL  --  104 105 106  CO2  --  24 25 25   GLUCOSE  --  131* 80 112*  BUN  --  16 22 19   CREATININE  --  0.93 1.29* 1.17*  CALCIUM  --  8.7 9.2 9.1  TSH 0.888  --   --   --    Liver Function Tests: Recent Labs    12/10/17 1349 01/08/18 1150  AST 26 19  ALT 17 17  ALKPHOS  --  72  BILITOT 1.3* 0.8  PROT 6.1 5.9*  ALBUMIN  --  3.6   No  results for input(s): LIPASE, AMYLASE in the last 8760 hours. No results for input(s): AMMONIA in the last 8760 hours. CBC: Recent Labs    11/27/17 1221 12/10/17 1349 01/08/18 1150  WBC 11.1* 11.7* 9.9  NEUTROABS  --  8,295* 6.3  HGB 13.0 14.7 13.7  HCT 40.1 45.5* 42.7  MCV 89 86.8 88  PLT 249 196  --    Lipid Panel: Recent Labs    12/10/17 1349  CHOL 204*  HDL 70  LDLCALC 111*  TRIG 123  CHOLHDL 2.9   TSH: Recent Labs    08/13/17 1335  TSH 0.888   A1C: Lab Results  Component Value Date   HGBA1C 5.2 12/10/2017     Assessment/Plan 1. Dementia with behavioral disturbance, unspecified dementia type -progressive disease, pts brother is primary caregiver and she lives with son. Continues to follow up with neurology and maintained on aricept and namenda.  - Amb Referral to Palliative Care, to provide more information on their services to family to see if they are interested in having service. At this time it seems like this would benefit and daughter very interested  2. Essential hypertension Elevated, will increase bystolic to 10 mg daily, continue to monitor and notify if sbp >140. Also encouraged dietary modifications.  - nebivolol (BYSTOLIC) 10 MG tablet; Take 1 tablet (10 mg total) by mouth  daily.  Dispense: 30 tablet; Refill: 1  3. Gastroesophageal reflux disease without esophagitis Ongoing on protonix and ranitidine which was prescribed by GI. Also has made dietary modifications.   4. Fall, initial encounter Hx of multiple falls, has completed PT in the past and family has therapist coming in to help work with her and do exercises but she has not been interested in doing this routinely. Does not use walker routinely which has been encouraged. Currently without injury. Fall precautions discussed.  5. CKD (chronic kidney disease), stage III ([place]) -follow up BMP, Rx given they do not wish to have labs drawn in office today.   Total time 22mins:  time greater than  50% of total time spent doing pt counseling and coordination of care regarding dementia and other chronic conditions.  Next appt: 3 months with Dr Mariea Clonts, sooner if needed [place] [facility] [place], [facility] [phone]

## 2018-05-27 ENCOUNTER — Telehealth: Payer: Self-pay | Admitting: Internal Medicine

## 2018-05-27 NOTE — Telephone Encounter (Signed)
Patient brother states pt is having problems with nausea and does not think pt can wait 2 weeks to see an APP. Patient brother requesting a call to speak with nurse about options for pt.

## 2018-05-27 NOTE — Telephone Encounter (Signed)
Spoke with patient's brother and he states patient saw her PCP this week and was told to see Dr. Hilarie Fredrickson for her chronic nausea. She is currently taking Pantoprazole and ranitidine without relief. Scheduled with Tye Savoy, NP On 06/01/18 at 3 PM.

## 2018-05-28 ENCOUNTER — Other Ambulatory Visit: Payer: Medicare Other

## 2018-05-31 ENCOUNTER — Ambulatory Visit: Payer: Medicare Other | Admitting: Neurology

## 2018-05-31 ENCOUNTER — Telehealth: Payer: Self-pay | Admitting: *Deleted

## 2018-05-31 ENCOUNTER — Telehealth: Payer: Self-pay | Admitting: Neurology

## 2018-05-31 DIAGNOSIS — M1711 Unilateral primary osteoarthritis, right knee: Secondary | ICD-10-CM | POA: Diagnosis not present

## 2018-05-31 DIAGNOSIS — M19011 Primary osteoarthritis, right shoulder: Secondary | ICD-10-CM | POA: Diagnosis not present

## 2018-05-31 MED ORDER — AXONA PO PACK: 40.0000 g | PACK | Freq: Every day | ORAL | 3 refills | Status: DC

## 2018-05-31 NOTE — Telephone Encounter (Signed)
I called and left a message, Virginia [name] can be used in place of the [place].  I will send in a prescription.

## 2018-05-31 NOTE — Telephone Encounter (Signed)
palliative and hospice work today, they would not qualify for hospice at this time and if this is before medication lets NOT increase medication- please update medication list to reflect this.

## 2018-05-31 NOTE — Telephone Encounter (Signed)
Blood pressures are good at this time. What was her HR? To give half tablet for sbp <100

## 2018-05-31 NOTE — Telephone Encounter (Signed)
Niece called and said Bystolic was recently increased. Recent BP readings : 6-28=114/82, 6-29= 114/96, 6-30=111/72  Are there any parameters as to when to hold med?

## 2018-05-31 NOTE — Telephone Encounter (Signed)
I spoke with daughter. She said the readings she gave were with no meds at all. So, she asks if she should even increase it.   Also she was asking about hospice for her mom. She wanted to know if you thought she had less than 6 months to live or if she would qualify. Her brother doesn't really want palliative care due to social work involvement and they think hospice would treat her without that. Daughter just knows they need help and she can't do it alone.

## 2018-05-31 NOTE — Telephone Encounter (Signed)
Pt's brother/David (DPR) advised VAYACOG 100-19.5-6.5 MG CAPS is out of stock and pharmacy is not sure when it will be available. Is there another medication that can be substituted. Pharmacy: [facility]

## 2018-05-31 NOTE — Telephone Encounter (Signed)
Med list updated. Daughter notified to continue 5mg  instead of 10mg  and to call if SBP <100. HR have been in the 80's. I advised patient doesn't qualify for hospice currently and that hospice and palliative care work together. Advised that both services will have social work involved regardless, just possibly to different extents. Questioned why they didn't want help to access all the assistance that patient qualifies for. She said it isn't that, but the SW at the hospital seemed to think patient's son was the main problem and advised to remove him from any patient care. (I don't see any recent hospital visit in [place]) Milo again that SW will be involved to some extent and that they will need to use palliative care for now for any assistance that is required. She verbalized understanding.

## 2018-06-01 ENCOUNTER — Telehealth: Payer: Self-pay

## 2018-06-01 ENCOUNTER — Ambulatory Visit: Payer: Medicare Other | Admitting: Nurse Practitioner

## 2018-06-01 ENCOUNTER — Telehealth: Payer: Self-pay | Admitting: Internal Medicine

## 2018-06-01 NOTE — Telephone Encounter (Signed)
Spoke with pts brother and discussed with him that they could try taking ondansetron 4mg  2 every 8 hours to see if that helps. Also reviewed taking protonix 40mg  in am 78min prior to meal and zantac in the evening. They will try this until the appt on the 11th and call back if needed prior to appt.

## 2018-06-01 NOTE — Telephone Encounter (Signed)
Received phone call from patient's daughter who shared that she would like to have palliative care involvement and that she shares health care decisions with her brother. Daughter shared that her brother was not comfortable with a Education officer, museum because of previous experiences. Will discuss with PCP

## 2018-06-04 ENCOUNTER — Other Ambulatory Visit: Payer: Self-pay | Admitting: Internal Medicine

## 2018-06-08 ENCOUNTER — Telehealth: Payer: Self-pay

## 2018-06-08 ENCOUNTER — Other Ambulatory Visit: Payer: Medicare Other | Admitting: *Deleted

## 2018-06-08 DIAGNOSIS — Z515 Encounter for palliative care: Secondary | ICD-10-CM

## 2018-06-08 NOTE — Telephone Encounter (Signed)
Noted  

## 2018-06-08 NOTE — Telephone Encounter (Signed)
Patient's daughter [name] called to request Palliative Care Referral. [name] states this is an urgent request. [name] asked that referral be sent today. I informed [name] that patient's primary care provider is out of office and Dr.Carter will have to review and advise. I told [name] that I could not promise that this request will be completed today.  [name] states she has noticed a rapid decline in patient's care, referral order was placed on 05/24/18 and services was refused by patient's son whom shares POA responsibilities. [name] states her brother is now on board and in agreement with referral order request.   Please advise

## 2018-06-08 NOTE — Telephone Encounter (Signed)
Patient's daughter called to say that the previous palliative care order was still good. She called palliative care and they are going to send a nurse to see patient today.   Please disregard urgent request for palliative care referral.

## 2018-06-08 NOTE — Telephone Encounter (Signed)
Received phone call from patient's daughter who requested that Palliative Care nurse make a visit. Daughter became tearful and shared that patient has been declining. Monisha RN aware and to make visit today

## 2018-06-10 ENCOUNTER — Ambulatory Visit: Payer: Medicare Other | Admitting: Physician Assistant

## 2018-06-11 ENCOUNTER — Telehealth: Payer: Self-pay

## 2018-06-11 ENCOUNTER — Other Ambulatory Visit: Payer: Medicare Other | Admitting: *Deleted

## 2018-06-11 DIAGNOSIS — Z515 Encounter for palliative care: Secondary | ICD-10-CM

## 2018-06-11 NOTE — Progress Notes (Signed)
COMMUNITY PALLIATIVE CARE RN NOTE  PATIENT NAME: Virginia [name] DOB: February 05, 1936 MRN: [id number]  PRIMARY CARE PROVIDER: Lauree Chandler, NP  RESPONSIBLE PARTY:  Acct ID - Guarantor Home Phone Work Phone Relationship Acct Type  [id number] Virginia [name], Virginia [name]* [phone]  Self P/F     [address], [place], [place] [address]    PLAN OF CARE and INTERVENTION:  1. ADVANCE CARE PLANNING/GOALS OF CARE: Remain at home with family as caregivers, avoid hospitalizations 2. PATIENT/CAREGIVER EDUCATION: Explained Palliative Care Services, Reinforced Safety/Fall Precautions and Nausea Management 3. DISEASE STATUS: Received message from [place] stating that daughter called to report that patient has seemed to have a drastic, sudden decline over the past 2 days and requested a nurse visit. Upon arrival, was greeted by daughter outside who appeared very nervous and anxious. Patient lying in bed asleep, but able to be aroused. Patient very irritable stating that she did not want to be bothered and wanted to rest. Daughter was pleased that at least patient was talking, as she had gone several hours today in a deep sleep. Was able to get patient to allow this RN to perform quick assessment before having a meeting with family. Patient's son and brother also present during meeting. All are concerned with patient's decline. States that patient mostly in bed all day yesterday. Becoming increasingly more confused, less energy along with increased sleeping. Poor safety awareness. Patient was up to her recliner earlier this am for several hours then went to bed. Patient is ambulatory, however will not utilize her walker and is very unsteady. Daughter had to assist patient to the bathroom today, where patient fell asleep on the commode and was unable to wipe herself which is new. Daughter had to walk with patient to get her back in her bed. Gait is much more unsteady. Very high fall risk.  Patient has had 2 falls in the past 2 weeks. No apparent injuries. Requires 1 person assistance with bathing, dressing, and ambulation. Patient has a hired caregiver that comes for 30 minutes twice weekly to assist with baths. Patient has been refusing her personal care more frequently over the past few weeks. Patient also will not take all of her medications as prescribed so family is focusing on giving the most important medications first. Patient also refusing to go to scheduled MD appointments. Patient has an order for lab work but refuses to allow them to take her to the lab. Intake is deteriorating. Patient ate 1/2 protein cookie and glass of Ensure for breakfast. No dysphagia reported, however patient is often c/o nausea which may be contributing to low intake. Family not giving Zofran often, but did give 8 mg this am, but unable to tell if effective, as patient went to sleep afterwards and remained in bed. Recommended that they trial giving medication more regularly to see if this helps. They are continuing to give Protonix Q am 30 minutes prior to meals, but stopped giving Ranitidine during the night as they report patient started having bad dreams and thought that her dreams were reality. Patient used to wander during the night, but due to weakness has not been an issue lately. Patient has an appointment with Dr. Hilarie Fredrickson on 06/10/18 (GI) but family feels that patient will more than likely refuse leaving the house. Daughter interested in information regarding hospice. Educated family regarding the difference between [place] especially in regards to hospice having 24/7 availability. Son does not feel that patient  needs hospice at this time, but is agreeable to continue Palliative Services. Family agreed to allow this RN to visit on Friday 06/11/18 to re-assess patient's condition to see if decline continues. Daughter is travelling outside of town tomorrow to [place] tomorrow and would  like phone call during visit. This RN agreed.   HISTORY OF PRESENT ILLNESS:  This is a 82 yo female who resides in her home with her son and brother as caregivers. Palliative Care will continue to follow patient to assess overall condition and assist with symptom management needs. Next visit scheduled 06/11/18.    CODE STATUS: FULL CODE ADVANCED DIRECTIVES: Y MOST FORM: no PPS: Weak 40%   PHYSICAL EXAM:   VITALS: Today's Vitals   06/08/18 1606  BP: 99/70  Pulse: 72  Resp: 16  SpO2: 93%  PainSc: 0-No pain    LUNGS: clear to auscultation  CARDIAC: Cor RRR EXTREMITIES: No edema SKIN: Exposed skin dry and intact  NEURO: Arousable, lethargic, irritable, able to make needs known, increased generalized weakness   (Duration of visit and documentation 2 hours)    [name], RN , BSN

## 2018-06-11 NOTE — Telephone Encounter (Signed)
Patient's son Virginia [name] called requesting appointment for patient to be seen for a possible UTI.  Patient not doing well all week. Patient in bed all day, decreased appetite, not drinking enough fluids, back pain and wetting herself (not able to get to the bathroom in time).  [name] and the Palliative Hospice nurse are concerned that patient may have a UTI. [name] would like for his mother to get antibiotic treatment sooner than later.   I recommended urgent care for evaluation since we do not have office providers after 12 on Friday's, no available appointment. [name] agreed and plans to take his mother to urgent care.   Patient's primary care provider Lauree Chandler, NP  Is out of office, message will be sent to covering doctor, Dr.Carter as a FYI  S.Chrae B/CMA

## 2018-06-14 ENCOUNTER — Telehealth: Payer: Self-pay

## 2018-06-14 ENCOUNTER — Encounter (HOSPITAL_COMMUNITY): Payer: Self-pay | Admitting: Emergency Medicine

## 2018-06-14 ENCOUNTER — Ambulatory Visit (HOSPITAL_COMMUNITY)
Admission: EM | Admit: 2018-06-14 | Discharge: 2018-06-14 | Disposition: A | Discharge status: Home / Self Care | Payer: Medicare Other | Attending: Family Medicine | Admitting: Family Medicine

## 2018-06-14 ENCOUNTER — Other Ambulatory Visit: Payer: Self-pay

## 2018-06-14 DIAGNOSIS — R5383 Other fatigue: Secondary | ICD-10-CM | POA: Diagnosis not present

## 2018-06-14 LAB — POCT URINALYSIS DIP (DEVICE)
BILIRUBIN URINE: NEGATIVE
GLUCOSE, UA: NEGATIVE mg/dL
HGB URINE DIPSTICK: NEGATIVE
Ketones, ur: NEGATIVE mg/dL
Leukocytes, UA: NEGATIVE
NITRITE: NEGATIVE
Protein, ur: NEGATIVE mg/dL
Specific Gravity, Urine: 1.01 (ref 1.005–1.030)
Urobilinogen, UA: 0.2 mg/dL (ref 0.0–1.0)
pH: 5.5 (ref 5.0–8.0)

## 2018-06-14 MED ORDER — ONDANSETRON 4 MG PO TBDP: ORAL_TABLET | ORAL | 0 refills | Status: DC

## 2018-06-14 MED ORDER — NEBIVOLOL HCL 5 MG PO TABS: 2.5000 mg | ORAL_TABLET | Freq: Every day | ORAL | 0 refills | Status: DC

## 2018-06-14 NOTE — Telephone Encounter (Signed)
Dr. Blanchie Serve with [facility] [place] called to speak directly with Dr. Mariea Clonts regarding the patient. Per Dr. Mariea Clonts, the following medication changes will be made due to [facility] List Criteria and weakness/malaise while at Urgent Care.   1) Bystolic will be decreased to 2.5 mg daily.   2) karbinal ER will be discontinued   3) Zofran 4 mg will be changed to once daily.    The medication list was updated by Dr. Meda Coffee.

## 2018-06-14 NOTE — Telephone Encounter (Signed)
Noted  

## 2018-06-14 NOTE — ED Provider Notes (Signed)
Virginia [name]    CSN: [id number] Arrival date & time: 06/14/18  1411     History   Chief Complaint Chief Complaint  Patient presents with  . Fatigue    HPI Virginia [name] is a 82 y.o. female.   HPI  This is a 82 year old woman brought in by her brother for evaluation of fatigue.  She has not been herself for the last 5 to 7 days.  She sits more.  She is a little less steady on her feet.  She seems more tired.  Her appetite is poor.  On Friday there was a home health nurse visiting.  She states her temperature was 99.3.  Was recommended that she be brought to the urgent care center for possible UTI.  She is having no urinary symptoms, frequency, dysuria, or incontinence.  She does not have a history of UTI.  There was thought of possibly any UTI was causing her to feel poorly.  Recent medication change includes the home health nurse recommending patient take Zofran 8 mg every morning since she was bothered by nausea a lot.  This is helping the nausea. They called the geriatric PCP office for advice.  They wish to take a urine sample to the office.  Because there might be something else going on, the urgent care was recommended. She states she has no pain.  No cough or cold.  No problems with bowels or digestion. She does have allergies.  She uses Breo daily.  She has not needed any other rescue inhalers.  She uses one nasal spray.  The chart indicates she has both Flonase and Atrovent, brother does not know which when she is using. For her dementia she is under the care of Dr. Jannifer [name].  She takes both Namenda and Aricept.  She is also on dietary supplements.  She has not had any change in behavior, mood, or memory. She is always a bit uncooperative.  She does not like to leave the house.  Just like to go the doctor.  He is with difficulty that they bring her to the urgent care center.  Past Medical History:  Diagnosis Date  . Arthritis   . Carotid artery disease ([name])    a. s/p L  CEA. b. followed by [facility].  . Chronic diastolic CHF (congestive heart failure) ([place])   . CKD (chronic kidney disease), stage III ([place])   . COPD (chronic obstructive pulmonary disease) ([place])   . Diverticulosis   . Fall at home Sept. 2013  . Hyperlipidemia   . Hypertension   . Internal hemorrhoid   . Memory disorder 10/24/2014    Patient Active Problem List   Diagnosis Date Noted  . Primary osteoarthritis involving multiple joints 07/08/2017  . Dementia with behavioral disturbance 07/08/2017  . Anxiety 07/08/2017  . Gastroesophageal reflux disease 07/08/2017  . Hypotension 02/01/2017  . CKD (chronic kidney disease), stage III ([place]) 02/01/2017  . Cough 01/31/2016  . Vitamin D deficiency 02/15/2015  . Hyperlipidemia 01/18/2015  . COPD GOLD IV 12/30/2014  . Chronic diastolic CHF (congestive heart failure) ([place]) 12/08/2014  . Dyspnea   . Congestive heart disease ([place])   . Hyponatremia 12/02/2014  . Essential hypertension 12/02/2014  . Nausea and vomiting 11/30/2014  . Memory disorder 10/24/2014  . Carotid stenosis 10/19/2012    Past Surgical History:  Procedure Laterality Date  . APPENDECTOMY    . BREAST REDUCTION SURGERY    . CAROTID ENDARTERECTOMY     left CEA  .  CATARACT EXTRACTION Bilateral   . COLONOSCOPY  2015  . EYE SURGERY     cataracts removed, bilaterally   . skin cancer removal    . TONSILLECTOMY      OB History   None      Home Medications    Prior to Admission medications   Medication Sig Start Date End Date Taking? Authorizing Provider  aspirin EC 81 MG tablet Take 81 mg by mouth every evening.   Yes [provider]  atorvastatin (LIPITOR) 20 MG tablet take 1 tablet by mouth once daily 08/13/17  Yes Fay Records, MD  donepezil (ARICEPT) 10 MG tablet Take 1 tablet (10 mg total) by mouth daily. 01/04/18  Yes Kathrynn Ducking, MD  fluticasone [facility]) 50 MCG/ACT nasal spray Place 2 sprays into both nostrils daily. 04/19/18  Yes Valentina Shaggy, MD  fluticasone furoate-vilanterol (BREO ELLIPTA) 100-25 MCG/INH AEPB Inhale 1 puff into the lungs daily. 04/19/18  Yes Valentina Shaggy, MD  furosemide (LASIX) 20 MG tablet Take 1 tablet daily, take 1 tablet daily as needed, for weight gain of 3-5 lbs 11/30/17  Yes Dunn, Dayna N, PA-C  ipratropium (ATROVENT) 0.06 % nasal spray USE 2 SPRAYS IN EACH NOSTRIL TWICE DAILY 05/24/18  Yes Lauree Chandler, NP  ipratropium-albuterol (DUONEB) 0.5-2.5 (3) MG/3ML SOLN inhale contents of 1 vial in nebulizer every 6 hours 07/07/17  Yes Eulas Post, Monica, DO  memantine (NAMENDA) 10 MG tablet Take 1 tablet (10 mg total) by mouth 2 (two) times daily. 01/18/18  Yes Ward Givens, NP  Multiple Vitamins-Minerals (PRESERVISION AREDS 2) CAPS Take 1 capsule by mouth 2 (two) times daily.    Yes [provider]  albuterol (PROVENTIL) (2.5 MG/3ML) 0.083% nebulizer solution Take 6 mLs (5 mg total) by nebulization every 4 (four) hours as needed for wheezing or shortness of breath. 02/03/17   Geradine Girt, DO  ARTIFICIAL TEAR OP Place 2 drops into both eyes daily.     [provider]  azelastine (ASTELIN) 0.1 % nasal spray Place 2 sprays into both nostrils 2 (two) times daily. Use in each nostril as directed 06/12/17   Bobbitt, [place] Muta, MD  Calcium Carbonate-Vitamin D (CALCIUM-D PO) Take 1 tablet by mouth every evening.    [provider]  diclofenac sodium (VOLTAREN) 1 % GEL Apply 1 application topically 2 (two) times daily as needed (shoulder and knee pain). 10/21/17   Lauree Chandler, NP  Dietary Management Product (AXONA) packet Take 40 g by mouth daily. 05/31/18   Kathrynn Ducking, MD  nebivolol (BYSTOLIC) 5 MG tablet Take 0.5 tablets (2.5 mg total) by mouth daily. 06/14/18   Raylene Everts, MD  ondansetron (ZOFRAN ODT) 4 MG disintegrating tablet Take one in  the morning 06/14/18   Raylene Everts, MD  pantoprazole (PROTONIX) 40 MG tablet Take 1 tablet (40 mg total) by mouth  daily. 11/09/17   Lauree Chandler, NP  Probiotic Product (PROBIOTIC DAILY PO) Take 1 capsule by mouth daily.    [provider]    Family History Family History  Problem Relation Age of Onset  . Heart disease Father        Before age 28  . Hypertension Father   . Heart attack Father   . Cancer Mother 28       Brain  . Dementia Brother   . Cancer Maternal Aunt 61       breast cancer  . Diabetes Maternal Aunt   .  Heart failure Maternal Grandmother   . Diabetes Maternal Grandmother   . Stroke Neg Hx     Social History Social History   Tobacco Use  . Smoking status: Current Some Day Smoker    Packs/day: 0.25    Years: 20.00    Pack years: 5.00    Types: Cigarettes    Last attempt to quit: 11/30/1994    Years since quitting: 23.5  . Smokeless tobacco: Never Used  . Tobacco comment: 1 cigarette per day average  Substance Use Topics  . Alcohol use: No    Alcohol/week: 0.0 oz  . Drug use: No     Allergies   Lidocaine and Other   Review of Systems Review of Systems  Constitutional: Positive for activity change, appetite change, fatigue and unexpected weight change. Negative for chills and fever.  HENT: Negative for congestion, dental problem, ear pain and sore throat.   Eyes: Negative for pain and visual disturbance.  Respiratory: Negative for cough and shortness of breath.   Cardiovascular: Negative for chest pain and palpitations.  Gastrointestinal: Negative for abdominal pain, constipation, diarrhea and vomiting.  Genitourinary: Negative for difficulty urinating, dysuria, frequency and hematuria.  Musculoskeletal: Positive for gait problem. Negative for arthralgias and back pain.  Skin: Negative for color change and rash.  Neurological: Positive for weakness. Negative for seizures and syncope.  Psychiatric/Behavioral: Positive for agitation and confusion.  All other systems reviewed and are negative.    Physical Exam Triage Vital Signs ED Triage  Vitals  Enc Vitals Group     BP 06/14/18 1439 (!) 100/33     Pulse Rate 06/14/18 1439 77     Resp --      Temp 06/14/18 1439 98 F (36.7 C)     Temp src --      SpO2 06/14/18 1439 94 %     Weight --      Height --      Head Circumference --      Peak Flow --      Pain Score 06/14/18 1640 0     Pain Loc --      Pain Edu? --      Excl. in [place]? --    No data found.  Updated Vital Signs BP 122/69 (BP Location: Right Arm)   Pulse 77   Temp 98 F (36.7 C)   SpO2 94%      Physical Exam  Constitutional: She appears well-developed and well-nourished. No distress.  Pleasant.  Content to let mother answer most of the questions.  HENT:  Head: Normocephalic and atraumatic.  Mouth/Throat: Oropharynx is clear and moist.  Eyes: Pupils are equal, round, and reactive to light. Conjunctivae are normal.  Neck: Normal range of motion.  No bruit  Cardiovascular: Normal rate, regular rhythm and normal heart sounds.  Pulmonary/Chest: Effort normal and breath sounds normal. No respiratory distress. She has no wheezes. She has no rales.  Abdominal: Soft. Bowel sounds are normal. She exhibits no distension.  Musculoskeletal: Normal range of motion. She exhibits edema.  Neurological: She is alert.  Examined in wheelchair  Skin: Skin is warm and dry.     UC Treatments / Results  Labs (all labs ordered are listed, but only abnormal results are displayed) Labs Reviewed  POCT URINALYSIS DIP (DEVICE)    EKG None  Radiology No results found.  Procedures Procedures (including critical care time)  Medications Ordered in UC Medications - No data to display  Initial Impression / Assessment  and Plan / UC Course  I have reviewed the triage vital signs and the nursing notes.  Pertinent labs & imaging results that were available during my care of the patient were reviewed by me and considered in my medical decision making (see chart for details).     Discussed with daughter in [place],  as well as brother who is present.  Called and spoke to patient's PCP.  Covering for her usual nurse practitioner is Dr. Mariea Clonts who is kind enough to discuss case with me.  Her urinalysis is normal.  Only physical exam finding is her vital signs, blood pressure down as low as 100/33.  We discussed medication changes to simplify her regimen and perhaps increase her energy level.  Reduce the Bystolic by half.  Reduce the Zofran on by half.  Stop the antihistamine liquid twice a day.  I did look at up comments on the beers criteria for cautious use in elderly.  The home health nurse will follow up with the home in a couple days, check her blood pressure, report back to the PCP if there is any difficulty. Final Clinical Impressions(s) / UC Diagnoses   Final diagnoses:  Fatigue, unspecified type     Discharge Instructions     Spoken with Dr. Donalda Ewings partner The urine test is completely normal You need to reduce her Bystolic.  She needs to take one half a pill daily (2.5 mg). Your home health nurse will continue to follow the blood pressure Zofran (ondansetron) 8 mg a day as a high dose.  She needs to be taking only 4 mg a day in the morning. We want you to stop the allergy medicine.  This carries a warning in elderly patients.  If she needs an allergy medicine your doctor will provide something else.    ED Prescriptions    Medication Sig Dispense Auth. Provider   nebivolol (BYSTOLIC) 5 MG tablet Take 0.5 tablets (2.5 mg total) by mouth daily. 30 tablet Raylene Everts, MD   ondansetron (ZOFRAN ODT) 4 MG disintegrating tablet Take one in  the morning 30 tablet Raylene Everts, MD     Controlled Substance Prescriptions [place] Controlled Substance Registry consulted? Not Applicable   Raylene Everts, MD 06/14/18 [phone]

## 2018-06-14 NOTE — Telephone Encounter (Signed)
Agree with summary of changes as written.

## 2018-06-14 NOTE — Telephone Encounter (Signed)
I attempted to speak with patient's brother, [name], and he would not accept that he needs to take patient to the urgent care to be seen. He stated that we are patient's PCP and we are responsible for her care. I informed patient that there are no appointments available in the office and he stated that he did not care. He stated that he would have patient's son call back and we could deal with him.    Routed to [employer] and Dr. Eulas Post

## 2018-06-14 NOTE — Telephone Encounter (Signed)
Patient's son called and requested an appointment later in the week. Patient was scheduled for Wednesday 06/16/18. He informed to take patient to urgent care if symptoms worsened. He verbalized understanding.    Per Caren Griffins, this message has been forwarded to Dr. Hollace Kinnier, DO (Lead Physician) as Juluis Rainier.

## 2018-06-14 NOTE — Telephone Encounter (Signed)
Patient's brother called this morning to request that patient be allowed to bring a urine sample due to having a fever on Friday, low energy, and increased memory issues. The palliative care nurse suspects that patient has UTI and would like to have urine checked.   Patient did not go to urgent care on Friday as suggested because patient's brother states that it is too hard to get patient out of the house. I informed him that patient would have to come to office to give specimen if provider approved to ensure that a clean catch was done. He agreed.   Please advise.

## 2018-06-14 NOTE — Discharge Instructions (Signed)
Spoken with Dr. Donalda Ewings partner The urine test is completely normal You need to reduce her Bystolic.  She needs to take one half a pill daily (2.5 mg). Your home health nurse will continue to follow the blood pressure Zofran (ondansetron) 8 mg a day as a high dose.  She needs to be taking only 4 mg a day in the morning. We want you to stop the allergy medicine.  This carries a warning in elderly patients.  If she needs an allergy medicine your doctor will provide something else.

## 2018-06-14 NOTE — ED Triage Notes (Signed)
Pt is with palliative care at home.  Starting Friday she has been very fatigued. Her brother reports she has had hot and cold flashes and her temp was 99.3 on Friday.   The palliative RN recommended they come here today to check for a UTI.

## 2018-06-14 NOTE — Telephone Encounter (Signed)
If he can get her to the office for urine sample, he can take her to urgent care. Please take her to urgent care to be seen by a provider. Something more than a UTI could be going on

## 2018-06-15 ENCOUNTER — Telehealth: Payer: Self-pay | Admitting: Internal Medicine

## 2018-06-15 DIAGNOSIS — I5032 Chronic diastolic (congestive) heart failure: Secondary | ICD-10-CM

## 2018-06-15 DIAGNOSIS — E785 Hyperlipidemia, unspecified: Secondary | ICD-10-CM

## 2018-06-15 NOTE — Telephone Encounter (Signed)
New Message   Pt's brother is calling states the pt was seen in the er and needs to schedule an appt. Offered 7/23(next Tuesday) at 10:20 but pt's brother states they is too difficult to get the pt in that early but its urgent she be seen. Told that is the only appt available until the end of august and pt's brother request to peak with a nurse. Please call

## 2018-06-15 NOTE — Telephone Encounter (Signed)
Spoke to patient's brother who has a prescription for a CMP lab draw from Sherrie Mustache NP.   When speaking to our phlebotomist, she said the only ones that could fulfill that request would be [employer] [facility] [facility] on our 1st level.  The family would really like Lanny Hurst to draw the labs here.  The only way to do that is if you would approve the order per you personally.  She is coming in to see you for an appt 7/29 @ 1:40.. Please advise, thank you.

## 2018-06-16 ENCOUNTER — Ambulatory Visit: Payer: Medicare Other | Admitting: Internal Medicine

## 2018-06-16 NOTE — Progress Notes (Signed)
COMMUNITY PALLIATIVE CARE RN NOTE  PATIENT NAME: Virginia [name] DOB: 12-09-1935 MRN: [id number]  PRIMARY CARE PROVIDER: Lauree Chandler, NP  RESPONSIBLE PARTY:  Acct ID - Guarantor Home Phone Work Phone Relationship Acct Type  [id number] TANDI, HANKO* [phone]  Self P/F     [address], [place], [place] [address]    PLAN OF CARE and INTERVENTION:  1. ADVANCE CARE PLANNING/GOALS OF CARE: Remain at home with assistance from son/brother, avoid hospitalizations 2. PATIENT/CAREGIVER EDUCATION: Reinforced Safety/Fall Precautions 3. DISEASE STATUS: Patient sitting up in her recliner today upon arrival. Patient greeted this RN with a smile this visit. Son and brother concerned that patient continues to sleep more overall and increased generalized weakness. Patient slept the majority of the day yesterday and did not get out of her bed other than to use the bathroom. Patient has been requiring more assistance with ambulation and toileting. Observed patient standing from lift chair, and she had to rock several times to stand. Having difficulty using controller on lift chair. Ambulated holding onto the furniture, but did not feel that she needed to use her walker. Intake continues to be poor. Ate 1/2 protein cookie and 1 cup of coffee. Ex-husband also present during visit who was able to encourage patient to take sips of her water and she ate a small bowl of bananas. Brother states patient was nauseated this am, but they are trying to give Zofran more regularly. Patient denies nausea at this time. Patient refused to take any of her medications today. Family states they are awaiting a new appointment with her GI doctor. Son states that patient is experiencing increased urinary incontinence and has a very strong odor. Son suspects UTI. Educated that increased incontinence could be due to increased weakness and lethargy and urine odor was most likely due to patient's poor fluid intake. Patient's PCP office is  closed for the day so explained that patient would have to go to Urgent Care if they wanted her to be checked for a UTI. Patient had low grade temp of 99.8, but also the space heater was on behind the recliner patient was sitting in. Patient much more alert during visit today, but is intermittently confused and forgetful. Denies pain, but does have issues with her lower back at times. Tylenol effective when needed. Patient also took Tylenol this am for a headache which she denies at this time. Daughter was contacted and placed on speakerphone so she could be aware of current patient condition.    HISTORY OF PRESENT ILLNESS:  This is a 82 yo female who resides at home with assistance of her son and brother as her caregivers. Palliative Care to continue to follow patient to assess overall condition and assist with symptom management needs. Next visit scheduled in 1 week.   CODE STATUS: FULL CODE ADVANCED DIRECTIVES: Y MOST FORM: no PPS: 40%   PHYSICAL EXAM:   VITALS: Today's Vitals   06/11/18 1241  BP: 117/74  Pulse: 70  Resp: 16  SpO2: 93%  PainSc: 0-No pain    LUNGS: clear to auscultation  CARDIAC: Cor RRR EXTREMITIES: No edema SKIN: Exposed skin dry and intact, Large bruised area noted to R lower leg from old wound  NEURO: Alert and oriented x 1, intermittently confused, forgetful, generalized weakness, ambulatory   (Duration of visit and documentation 90 minutes)    [name], RN, BSN

## 2018-06-17 NOTE — Telephone Encounter (Signed)
Would get CBC, CMET and lipids   Also TSH

## 2018-06-18 ENCOUNTER — Other Ambulatory Visit: Payer: Medicare Other | Admitting: *Deleted

## 2018-06-18 DIAGNOSIS — Z515 Encounter for palliative care: Secondary | ICD-10-CM

## 2018-06-18 NOTE — Telephone Encounter (Signed)
SW patient's brother He will bring her for labs on Monday early afternoon. She will eat breakfast and come in after lunchtime and eat afterwards.

## 2018-06-18 NOTE — Telephone Encounter (Signed)
Left message for patient's brother to call back.  

## 2018-06-21 ENCOUNTER — Other Ambulatory Visit: Payer: Medicare Other | Admitting: *Deleted

## 2018-06-21 DIAGNOSIS — I5032 Chronic diastolic (congestive) heart failure: Secondary | ICD-10-CM | POA: Diagnosis not present

## 2018-06-21 DIAGNOSIS — E785 Hyperlipidemia, unspecified: Secondary | ICD-10-CM | POA: Diagnosis not present

## 2018-06-21 NOTE — Progress Notes (Addendum)
COMMUNITY PALLIATIVE CARE RN NOTE  PATIENT NAME: Virginia [name] DOB: 1936-10-06 MRN: [id number]  PRIMARY CARE PROVIDER: Lauree Chandler, NP  RESPONSIBLE PARTY:  Acct ID - Guarantor Home Phone Work Phone Relationship Acct Type  [id number] ALAHIA, WHICKER* [phone]  Self P/F     [address], [place], [place] [address]    PLAN OF CARE and INTERVENTION:  1. ADVANCE CARE PLANNING/GOALS OF CARE: Remain at home, avoid hospitalizations 2. PATIENT/CAREGIVER EDUCATION: Reinforced Safety/Fall Precautions 3. DISEASE STATUS: Upon arrival, patient sitting up in her recliner in the living room asleep. Patient awakened briefly with verbal stimulation, but was very irritable and did not want to be bothered with questions, vital signs or assessment. Patient's brother [name] states that patient refused her medications this am and continues to refuse baths and assistance. Patient did eat a good breakfast this am, but refused lunch. Contacted daughter and placed on speakerphone as she requested to discuss patient's continued decline. Patient continues with increased sleep, decreased intake and irritability, even with her close family members which is new. [name] took patient to Urgent care earlier in the week d/t family suspecting a UTI d/t patient's increased urinary incontinence and strong urine odor. Urinalysis was negative. Patient was also running low BPs at the time and her Bystolic dose was cut in half. [name] took patient to the hair salon yesterday, and out for ice cream afterwards. Today patient is wanting to sleep. Daughter is concerned that patient no longer wants to travel as often outside of the home like she used to.  Provided extensive education on what to expect as patient continues to decline and reviewed goals for patient. Family wants patient to be comfortable without any suffering or pain, but is still interested in lab draws, antibiotics and IV fluids if necessary. [name] is going to try and take  patient to have her labs drawn this week, as family is concerned that patient may be dehydrated. Patient remains ambulatory, but does require assistance with bathing and dressing. Will visit patient again next week to assess overall condition.   HISTORY OF PRESENT ILLNESS:  This is a 82 yo female who resides in her home with her brother and son as caregivers. Palliative Care to continue to follow patient. Next visit scheduled in 1 week.   CODE STATUS: FULL CODE ADVANCED DIRECTIVES: Y MOST FORM: no PPS: 40%   PHYSICAL EXAM:   VITALS: Patient refused   LUNGS: No dyspnea noted CARDIAC: Unable to assess EXTREMITIES: No edema noted SKIN: Exposed skin dry and intact  NEURO: Lethargic, irritable, generalized weakness, ambulatory   (Duration of visit and documentation 60 minutes)    [name], RN, BSN

## 2018-06-22 LAB — COMPREHENSIVE METABOLIC PANEL
A/G RATIO: 1.5 (ref 1.2–2.2)
ALT: 15 IU/L (ref 0–32)
AST: 20 IU/L (ref 0–40)
Albumin: 3.3 g/dL — ABNORMAL LOW (ref 3.5–4.7)
Alkaline Phosphatase: 56 IU/L (ref 39–117)
BILIRUBIN TOTAL: 1.2 mg/dL (ref 0.0–1.2)
BUN/Creatinine Ratio: 15 (ref 12–28)
BUN: 17 mg/dL (ref 8–27)
CO2: 24 mmol/L (ref 20–29)
Calcium: 8.6 mg/dL — ABNORMAL LOW (ref 8.7–10.3)
Chloride: 104 mmol/L (ref 96–106)
Creatinine, Ser: 1.16 mg/dL — ABNORMAL HIGH (ref 0.57–1.00)
GFR calc Af Amer: 51 mL/min/{1.73_m2} — ABNORMAL LOW (ref >59)
GFR calc non Af Amer: 45 mL/min/{1.73_m2} — ABNORMAL LOW (ref >59)
Globulin, Total: 2.2 g/dL (ref 1.5–4.5)
Glucose: 97 mg/dL (ref 65–99)
POTASSIUM: 4.1 mmol/L (ref 3.5–5.2)
Sodium: 142 mmol/L (ref 134–144)
Total Protein: 5.5 g/dL — ABNORMAL LOW (ref 6.0–8.5)

## 2018-06-22 LAB — CBC
HEMATOCRIT: 43.8 % (ref 34.0–46.6)
Hemoglobin: 14 g/dL (ref 11.1–15.9)
MCH: 29.4 pg (ref 26.6–33.0)
MCHC: 32 g/dL (ref 31.5–35.7)
MCV: 92 fL (ref 79–97)
Platelets: 205 10*3/uL (ref 150–450)
RBC: 4.77 x10E6/uL (ref 3.77–5.28)
RDW: 14.1 % (ref 12.3–15.4)
WBC: 8.1 10*3/uL (ref 3.4–10.8)

## 2018-06-22 LAB — LIPID PANEL
CHOL/HDL RATIO: 3.3 ratio (ref 0.0–4.4)
Cholesterol, Total: 189 mg/dL (ref 100–199)
HDL: 58 mg/dL (ref >39)
LDL Calculated: 103 mg/dL — ABNORMAL HIGH (ref 0–99)
Triglycerides: 140 mg/dL (ref 0–149)
VLDL CHOLESTEROL CAL: 28 mg/dL (ref 5–40)

## 2018-06-22 LAB — TSH: TSH: 0.603 u[IU]/mL (ref 0.450–4.500)

## 2018-06-25 ENCOUNTER — Encounter (HOSPITAL_COMMUNITY): Payer: Self-pay

## 2018-06-25 ENCOUNTER — Ambulatory Visit (HOSPITAL_COMMUNITY)
Admission: EM | Admit: 2018-06-25 | Discharge: 2018-06-25 | Disposition: A | Discharge status: Home / Self Care | Payer: Medicare Other | Attending: Internal Medicine | Admitting: Internal Medicine

## 2018-06-25 ENCOUNTER — Other Ambulatory Visit: Payer: Medicare Other | Admitting: *Deleted

## 2018-06-25 DIAGNOSIS — S61412A Laceration without foreign body of left hand, initial encounter: Secondary | ICD-10-CM | POA: Diagnosis not present

## 2018-06-25 DIAGNOSIS — Z515 Encounter for palliative care: Secondary | ICD-10-CM

## 2018-06-25 NOTE — ED Provider Notes (Signed)
[facility]    CSN: [id number] Arrival date & time: 06/25/18  2000     History   Chief Complaint Chief Complaint  Patient presents with  . Abrasion    HPI Virginia [name] is a 82 y.o. female.  She has history of dementia.  Patient presents with a 1 cm superficial V-shaped flap skin tear dorsal left hand.  No surrounding bruising or swelling.  Able to make a fist, open her hand completely, range of motion of the left wrist is full.  Mechanism is unclear, her brother believes that she caught her hand on something.  Patient reports some back discomfort, which her brother indicates is related to sitting in a wheelchair since her arrival at the urgent care.    HPI  Past Medical History:  Diagnosis Date  . Arthritis   . Carotid artery disease ([place])    a. s/p L CEA. b. followed by [facility].  . Chronic diastolic CHF (congestive heart failure) ([place])   . CKD (chronic kidney disease), stage III ([place])   . COPD (chronic obstructive pulmonary disease) ([place])   . Diverticulosis   . Fall at home Sept. 2013  . Hyperlipidemia   . Hypertension   . Internal hemorrhoid   . Memory disorder 10/24/2014    Patient Active Problem List   Diagnosis Date Noted  . Primary osteoarthritis involving multiple joints 07/08/2017  . Dementia with behavioral disturbance 07/08/2017  . Anxiety 07/08/2017  . Gastroesophageal reflux disease 07/08/2017  . Hypotension 02/01/2017  . CKD (chronic kidney disease), stage III ([place]) 02/01/2017  . Cough 01/31/2016  . Vitamin D deficiency 02/15/2015  . Hyperlipidemia 01/18/2015  . COPD GOLD IV 12/30/2014  . Chronic diastolic CHF (congestive heart failure) ([place]) 12/08/2014  . Dyspnea   . Congestive heart disease ([place])   . Hyponatremia 12/02/2014  . Essential hypertension 12/02/2014  . Nausea and vomiting 11/30/2014  . Memory disorder 10/24/2014  . Carotid stenosis 10/19/2012    Past Surgical History:  Procedure Laterality Date  . APPENDECTOMY    .  BREAST REDUCTION SURGERY    . CAROTID ENDARTERECTOMY     left CEA  . CATARACT EXTRACTION Bilateral   . COLONOSCOPY  2015  . EYE SURGERY     cataracts removed, bilaterally   . skin cancer removal    . TONSILLECTOMY       Home Medications    Prior to Admission medications   Medication Sig Start Date End Date Taking? Authorizing Provider  albuterol (PROVENTIL) (2.5 MG/3ML) 0.083% nebulizer solution Take 6 mLs (5 mg total) by nebulization every 4 (four) hours as needed for wheezing or shortness of breath. 02/03/17   Geradine Girt, DO  ARTIFICIAL TEAR OP Place 2 drops into both eyes daily.     [provider]  aspirin EC 81 MG tablet Take 81 mg by mouth every evening.    [provider]  atorvastatin (LIPITOR) 20 MG tablet take 1 tablet by mouth once daily 08/13/17   Fay Records, MD  azelastine (ASTELIN) 0.1 % nasal spray Place 2 sprays into both nostrils 2 (two) times daily. Use in each nostril as directed 06/12/17   Bobbitt, [place] Muta, MD  Calcium Carbonate-Vitamin D (CALCIUM-D PO) Take 1 tablet by mouth every evening.    [provider]  diclofenac sodium (VOLTAREN) 1 % GEL Apply 1 application topically 2 (two) times daily as needed (shoulder and knee pain). 10/21/17   Lauree Chandler, NP  Dietary Management  Product (AXONA) packet Take 40 g by mouth daily. 05/31/18   Kathrynn Ducking, MD  donepezil (ARICEPT) 10 MG tablet Take 1 tablet (10 mg total) by mouth daily. 01/04/18   Kathrynn Ducking, MD  fluticasone (FLONASE) 50 MCG/ACT nasal spray Place 2 sprays into both nostrils daily. 04/19/18   Valentina Shaggy, MD  fluticasone furoate-vilanterol (BREO ELLIPTA) 100-25 MCG/INH AEPB Inhale 1 puff into the lungs daily. 04/19/18   Valentina Shaggy, MD  furosemide (LASIX) 20 MG tablet Take 1 tablet daily, take 1 tablet daily as needed, for weight gain of 3-5 lbs 11/30/17   Dunn, Dayna N, PA-C  ipratropium (ATROVENT) 0.06 % nasal spray USE 2 SPRAYS IN EACH  NOSTRIL TWICE DAILY 05/24/18   Lauree Chandler, NP  ipratropium-albuterol (DUONEB) 0.5-2.5 (3) MG/3ML SOLN inhale contents of 1 vial in nebulizer every 6 hours 07/07/17   Gildardo Cranker, DO  memantine (NAMENDA) 10 MG tablet Take 1 tablet (10 mg total) by mouth 2 (two) times daily. 01/18/18   Ward Givens, NP  Multiple Vitamins-Minerals (PRESERVISION AREDS 2) CAPS Take 1 capsule by mouth 2 (two) times daily.     [provider]  nebivolol (BYSTOLIC) 5 MG tablet Take 0.5 tablets (2.5 mg total) by mouth daily. 06/14/18   Raylene Everts, MD  ondansetron (ZOFRAN ODT) 4 MG disintegrating tablet Take one in  the morning 06/14/18   Raylene Everts, MD  pantoprazole (PROTONIX) 40 MG tablet Take 1 tablet (40 mg total) by mouth daily. 11/09/17   Lauree Chandler, NP  Probiotic Product (PROBIOTIC DAILY PO) Take 1 capsule by mouth daily.    [provider]    Family History Family History  Problem Relation Age of Onset  . Heart disease Father        Before age 58  . Hypertension Father   . Heart attack Father   . Cancer Mother 29       Brain  . Dementia Brother   . Cancer Maternal Aunt 61       breast cancer  . Diabetes Maternal Aunt   . Heart failure Maternal Grandmother   . Diabetes Maternal Grandmother   . Stroke Neg Hx     Social History Social History   Tobacco Use  . Smoking status: Current Some Day Smoker    Packs/day: 0.25    Years: 20.00    Pack years: 5.00    Types: Cigarettes    Last attempt to quit: 11/30/1994    Years since quitting: 23.5  . Smokeless tobacco: Never Used  . Tobacco comment: 1 cigarette per day average  Substance Use Topics  . Alcohol use: No    Alcohol/week: 0.0 oz  . Drug use: No     Allergies   Lidocaine and Other   Review of Systems Review of Systems  All other systems reviewed and are negative.    Physical Exam Triage Vital Signs ED Triage Vitals  Enc Vitals Group     BP 06/25/18 2056 (!) 135/54     Pulse  Rate 06/25/18 2055 69     Resp 06/25/18 2055 18     Temp 06/25/18 2055 98 F (36.7 C)     Temp src --      SpO2 06/25/18 2055 99 %     Weight --      Height --      Pain Score 06/25/18 2129 2     Pain Loc --  Updated Vital Signs BP (!) 135/54   Pulse 69   Temp 98 F (36.7 C)   Resp 18   SpO2 99%  Physical Exam  Constitutional: She is oriented to person, place, and time. No distress.  HENT:  Head: Atraumatic.  Eyes:  Conjugate gaze observed, no eye redness/discharge  Neck: Neck supple.  Cardiovascular: Normal rate.  Pulmonary/Chest: No respiratory distress.  Abdominal: She exhibits no distension.  Musculoskeletal: Normal range of motion.  Patient presents with a 1 cm superficial V-shaped flap skin tear dorsal left hand.  No surrounding bruising or swelling.  Able to make a fist, open her hand completely, range of motion of the left wrist is full.   Neurological: She is alert and oriented to person, place, and time.  Skin: Skin is warm and dry.  Nursing note and vitals reviewed.    UC Treatments / Results   Procedures Procedures (including critical care time) Skin tear washed and dressed per clinical staff.  Final Clinical Impressions(s) / UC Diagnoses   Final diagnoses:  Skin tear of left hand without complication, initial encounter     Discharge Instructions     Skin tear was cleaned and dressed with a nonadherent dressing.  Wash wound gently with soap/water once daily, apply antibiotic ointment and bandage.  Anticipate gradual healing over the next couple weeks.  Recheck if any increasing redness/swelling/pain/drainage or new fever>100.5, or if not starting to improve in a few days.        Wynona Luna, MD 06/28/18 2211

## 2018-06-25 NOTE — ED Triage Notes (Signed)
Pt presents with a skin tear to her left hand

## 2018-06-25 NOTE — Discharge Instructions (Signed)
Skin tear was cleaned and dressed with a nonadherent dressing.  Wash wound gently with soap/water once daily, apply antibiotic ointment and bandage.  Anticipate gradual healing over the next couple weeks.  Recheck if any increasing redness/swelling/pain/drainage or new fever>100.5, or if not starting to improve in a few days.

## 2018-06-28 ENCOUNTER — Other Ambulatory Visit: Payer: Medicare Other | Admitting: *Deleted

## 2018-06-28 ENCOUNTER — Telehealth: Payer: Self-pay | Admitting: Internal Medicine

## 2018-06-28 ENCOUNTER — Encounter: Payer: Self-pay | Admitting: Internal Medicine

## 2018-06-28 ENCOUNTER — Ambulatory Visit (INDEPENDENT_AMBULATORY_CARE_PROVIDER_SITE_OTHER): Payer: Medicare Other | Admitting: Internal Medicine

## 2018-06-28 VITALS — BP 100/58 | HR 77 | Ht 62.0 in | Wt 190.2 lb

## 2018-06-28 DIAGNOSIS — I6529 Occlusion and stenosis of unspecified carotid artery: Secondary | ICD-10-CM | POA: Diagnosis not present

## 2018-06-28 DIAGNOSIS — I10 Essential (primary) hypertension: Secondary | ICD-10-CM | POA: Diagnosis not present

## 2018-06-28 DIAGNOSIS — I5032 Chronic diastolic (congestive) heart failure: Secondary | ICD-10-CM | POA: Diagnosis not present

## 2018-06-28 DIAGNOSIS — E782 Mixed hyperlipidemia: Secondary | ICD-10-CM

## 2018-06-28 MED ORDER — NEBIVOLOL HCL 5 MG PO TABS: 2.5000 mg | ORAL_TABLET | Freq: Every day | ORAL | 6 refills | Status: DC

## 2018-06-28 NOTE — Patient Instructions (Signed)
Your physician has recommended you make the following change in your medication:  1.) stop atorvastatin (Lipitor) 2.) stop aspirin 3.) use lasix just as needed for weight gain 4.) BYSTOLIC --continue 1/2 tablet daily except:   if BP top number is less than 105 HOLD dose   or if BP top number is greater than 150 take WHOLE tablet  Your physician recommends that you schedule a follow-up appointment in: October with Dr. Harrington Challenger or APP on day Dr. Harrington Challenger is in office.

## 2018-06-28 NOTE — Progress Notes (Signed)
Cardiology Office Note    Date:  06/28/2018  ID:  Virginia [name], DOB Dec 10, 1935, MRN [id number] PCP:  Lauree Chandler, NP  Cardiologist:  Dr. Harrington Challenger   Chief Complaint: f/u of chronic diastolic  CHF  History of Present Illness:  Virginia [name] is a 82 y.o. female with history of chronic diastolic CHF, COPD GOLD IV on home O2 PRN, HTN, HLD, dementia, carotid artery disease s/p L CEA, internal hemorrhoid, diverticulosis, CKD stage III  Prior 2D echo 12/2014 showed EF 65-70%, grade 1 DD, She was last seen in cardiology by Royden Purl in Dec 2018     The pt comes in with her family today   They say she is doing less and less   Alos say that she is falling a lot  The pt has no complaints  Denies SOB    No CP   No PND   Family says she isint eating   Says she does not want to do anything  Says her memory is not good    Palliative care services have been by to help.       Past Medical History:  Diagnosis Date  . Arthritis   . Carotid artery disease ([place])    a. s/p L CEA. b. followed by [facility].  . Chronic diastolic CHF (congestive heart failure) ([place])   . CKD (chronic kidney disease), stage III ([place])   . COPD (chronic obstructive pulmonary disease) ([place])   . Diverticulosis   . Fall at home Sept. 2013  . Hyperlipidemia   . Hypertension   . Internal hemorrhoid   . Memory disorder 10/24/2014    Past Surgical History:  Procedure Laterality Date  . APPENDECTOMY    . BREAST REDUCTION SURGERY    . CAROTID ENDARTERECTOMY     left CEA  . CATARACT EXTRACTION Bilateral   . COLONOSCOPY  2015  . EYE SURGERY     cataracts removed, bilaterally   . skin cancer removal    . TONSILLECTOMY      Current Medications: Current Meds  Medication Sig  . albuterol (PROVENTIL) (2.5 MG/3ML) 0.083% nebulizer solution Take 6 mLs (5 mg total) by nebulization every 4 (four) hours as needed for wheezing or shortness of breath.  . ARTIFICIAL TEAR OP Place 2 drops into both eyes daily.   Marland Kitchen aspirin EC 81 MG  tablet Take 81 mg by mouth every evening.  Marland Kitchen atorvastatin (LIPITOR) 20 MG tablet take 1 tablet by mouth once daily  . azelastine (ASTELIN) 0.1 % nasal spray Place 2 sprays into both nostrils 2 (two) times daily. Use in each nostril as directed  . Calcium Carbonate-Vitamin D (CALCIUM-D PO) Take 1 tablet by mouth every evening.  . diclofenac sodium (VOLTAREN) 1 % GEL Apply 1 application topically 2 (two) times daily as needed (shoulder and knee pain).  . Dietary Management Product (AXONA) packet Take 40 g by mouth daily.  Marland Kitchen donepezil (ARICEPT) 10 MG tablet Take 1 tablet (10 mg total) by mouth daily.  . fluticasone (FLONASE) 50 MCG/ACT nasal spray Place 2 sprays into both nostrils daily.  . fluticasone furoate-vilanterol (BREO ELLIPTA) 100-25 MCG/INH AEPB Inhale 1 puff into the lungs daily.  . furosemide (LASIX) 20 MG tablet Take 1 tablet daily, take 1 tablet daily as needed, for weight gain of 3-5 lbs  . ipratropium (ATROVENT) 0.06 % nasal spray USE 2 SPRAYS IN EACH NOSTRIL TWICE DAILY  . ipratropium-albuterol (DUONEB) 0.5-2.5 (3) MG/3ML SOLN inhale contents of  1 vial in nebulizer every 6 hours  . meloxicam (MOBIC) 7.5 MG tablet Take 1 tablet by mouth 2 (two) times daily.  . memantine (NAMENDA) 10 MG tablet Take 1 tablet (10 mg total) by mouth 2 (two) times daily.  . Multiple Vitamins-Minerals (PRESERVISION AREDS 2) CAPS Take 1 capsule by mouth 2 (two) times daily.   . nebivolol (BYSTOLIC) 5 MG tablet Take 0.5 tablets (2.5 mg total) by mouth daily.  . ondansetron (ZOFRAN ODT) 4 MG disintegrating tablet Take one in  the morning  . pantoprazole (PROTONIX) 40 MG tablet Take 1 tablet (40 mg total) by mouth daily.  . Probiotic Product (PROBIOTIC DAILY PO) Take 1 capsule by mouth daily.     Allergies:   Lidocaine and Other   Social History   Socioeconomic History  . Marital status: Divorced    Spouse name: Not on file  . Number of children: 2  . Years of education: 7  . Highest education level:  Not on file  Occupational History  . Occupation: retired  Scientific laboratory technician  . Financial resource strain: Not hard at all  . Food insecurity:    Worry: Never true    Inability: Never true  . Transportation needs:    Medical: No    Non-medical: No  Tobacco Use  . Smoking status: Current Some Day Smoker    Packs/day: 0.25    Years: 20.00    Pack years: 5.00    Types: Cigarettes    Last attempt to quit: 11/30/1994    Years since quitting: 23.5  . Smokeless tobacco: Never Used  . Tobacco comment: 1 cigarette per day average  Substance and Sexual Activity  . Alcohol use: No    Alcohol/week: 0.0 oz  . Drug use: No  . Sexual activity: Never    Birth control/protection: Post-menopausal  Lifestyle  . Physical activity:    Days per week: 1 day    Minutes per session: 40 min  . Stress: Only a little  Relationships  . Social connections:    Talks on phone: More than three times a week    Gets together: More than three times a week    Attends religious service: Never    Active member of club or organization: Yes    Attends meetings of clubs or organizations: 1 to 4 times per year    Relationship status: Divorced  Other Topics Concern  . Not on file  Social History Narrative   04/05/18 lives with son and brother, [name]   Diet:       Do you drink/eat things with caffeine: yes      Marital Status: Divorced   What Year Married:1959      Do you live in a house, apartment, Assisted Living, Condo, trailer? House      Is it one or more stories? 2 stories      How many persons live in your home? Just Patient      Do you have any pets in your home? 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 Very Little   Type and how often? Walk      Do you have a living will? None   DNR?   Discuss one? No      Do you have signed POA/HPOA forms?      Patient drinks 1 cup of caffeine daily.   Patient is right handed.  Family History:  Family History  Problem  Relation Age of Onset  . Heart disease Father        Before age 50  . Hypertension Father   . Heart attack Father   . Cancer Mother 52       Brain  . Dementia Brother   . Cancer Maternal Aunt 61       breast cancer  . Diabetes Maternal Aunt   . Heart failure Maternal Grandmother   . Diabetes Maternal Grandmother   . Stroke Neg Hx     ROS:   Please see the history of present illness.  All other systems are reviewed and otherwise negative.    PHYSICAL EXAM:   VS:  BP (!) 100/58   Pulse 77   Ht 5\' 2"  (1.575 m)   Wt 190 lb 3.2 oz (86.3 kg)   SpO2 94%   BMI 34.79 kg/m   BMI: Body mass index is 34.79 kg/m. GEN: Well nourished, well developed elderly WF, in no acute distress Examined in chair   HEENT: normocephalic, atraumatic Neck: no JVD, carotid bruits, or masses Cardiac: RRR; no murmurs, rubs, or gallops, trace  LE edema bilaterally Respiratory:  clear to auscultation bilaterally, normal work of breathing GI: soft, nontender, nondistended, + BS MS: no deformity or atrophy  Skin: warm and dry, no rash Neuro:  Alert and Oriented x 3, Strength and sensation are intact, follows commands Psych: euthymic mood, full affect  Wt Readings from Last 3 Encounters:  06/28/18 190 lb 3.2 oz (86.3 kg)  05/24/18 196 lb (88.9 kg)  05/24/18 196 lb (88.9 kg)      Studies/Labs Reviewed:   EKG:  EKG not ordered today   Recent Labs: 11/27/2017: NT-Pro BNP 1,176 06/21/2018: ALT 15; BUN 17; Creatinine, Ser 1.16; Hemoglobin 14.0; Platelets 205; Potassium 4.1; Sodium 142; TSH 0.603   Lipid Panel    Component Value Date/Time   CHOL 189 06/21/2018 0000   TRIG 140 06/21/2018 0000   HDL 58 06/21/2018 0000   CHOLHDL 3.3 06/21/2018 0000   CHOLHDL 2.9 12/10/2017 1349   VLDL 23.8 08/01/2015 0857   LDLCALC 103 (H) 06/21/2018 0000   LDLCALC 111 (H) 12/10/2017 1349   LDLDIRECT 142.0 05/24/2015 1108    Additional studies/ records that were reviewed today include: Summarized  above.    ASSESSMENT & PLAN:   1. Chronic diastolic CHF with peripheral edema - Pt's volume is up some   Her oral intake is not good however.   Given this I would recomm she stop daily asix  Follow exam 9swelling, breathing) 2. HTN  Is a little low  Would cut bystolic to 1/2    Hold for SBP less than 105   Take 1 tab for SBP greater than 150 3. Hyperlipidemia - no myalgias   Since goal is palliation and pt with weakness family would like consideration to d/c    Will go ahead and stop lipitor 4. CKD stage III - f/u Cr today.  Disposition: F/u with me in October 2019.   Medication Adjustments/Labs and Tests Ordered: Current medicines are reviewed at length with the patient today.  Concerns regarding medicines are outlined above. Medication changes, Labs and Tests ordered today are summarized above and listed in the Patient Instructions accessible in Encounters.   Signed, Dorris Carnes, MD  06/28/2018 2:08 PM    [facility] [address], [place], [place]  [address] Phone: [phone]; Fax: [phone]

## 2018-06-28 NOTE — Telephone Encounter (Signed)
Routing to Dr. Harrington Challenger. If agrees will order labs for prior to next visit 10/15/18

## 2018-06-28 NOTE — Telephone Encounter (Signed)
New Message    Patient family would like the patient to have her lab work done before her appt in November, so that you have the results when she comes in. Please call

## 2018-06-28 NOTE — Telephone Encounter (Signed)
That is fine   Check CBC and BMET prior to next visit

## 2018-06-29 NOTE — Progress Notes (Signed)
[facility]ITY P[facility]ATIVE [facility]ARE RN NOTE  PATIENT NAME: Virginia [name] DOB: [birth year] MRN: [id number]  PRIMARY CARE PROVIDER: Lauree Chandler, NP  RESPONSIBLE PARTY:  Acct ID - Guarantor Home Phone Work Phone Relationship Acct Type  [id number] TYLIE, GOLONKA* [phone]  Self P/F     [address], [place], [place] [address]    PLAN OF CARE and INTERVENTION:  1. ADVANCE CARE PLANNING/GOALS OF CARE: Remain at home with brother as caregiver, avoid hospitalizations 2. PATIENT/CAREGIVER EDUCATION: Reinforced Safety/Fall Precautions 3. DISEASE STATUS: Patient sitting up in a chair in her kitchen upon arrival. Patient very pleasant and much more engaging on visit today. Brother [name] states that she has had a very good week. He was able to get her to the lab on Monday for lab draws that patient has been refusing for several weeks. Patient has an appointment with her Cardiologist next week where they will review her labs. Some days, patient's BP has been elevated. Bystolic recently cut in half to 2.5 mg daily, but on days of elevated BPs, [name] gives patient 5 mg. Will discuss this with Cardiologist on Monday. Patient has travelled outside of her home 2 additional times this week with family out to eat and to the hair salon yesterday. [name] has been giving patient Zofran every morning and he feels that this has helped with her nausea. Appetite has improved this week and patient has not been refusing her medications as she had been doing in the previous weeks. Patient is intermittently incontinent of bowel and bladder. Wears incontinence pads. Patient ambulatory and holds onto furniture while in her home. She does not feel that she needs to utilize a walker. Family has a CNA that comes twice weekly to assist patient with a bath. Patient continues to refuse this at times. Daughter [name] is coming into town tomorrow from [place]. Will visit with patient again next week.  HISTORY OF PRESENT ILLNESS: This is a 82  yo female who resides in her home with her brother and son as caregivers. Palliative Care to continue to follow patient. Next visit scheduled in 1 week.   CODE STATUS: FULL CODE ADVANCED DIRECTIVES: Y MOST FORM: no PPS: 40%   PHYSICAL EXAM:   VITALS: Today's Vitals   06/25/18 1503  BP: 118/82  Resp: 16  PainSc: 0-No pain  Pulse:                 79 Oxygen sat:        97%  LUNGS: clear to auscultation  CARDIAC: Cor RRR EXTREMITIES: Trace edema to bilateral feet/ankles SKIN: Exposed skin dry and intact  NEURO: Alert and oriented x 3, intermittent confusion/forgetfulness, ambulatory   (Duration of visit and documentation 60 minutes)    [name], RN, BSN

## 2018-06-30 ENCOUNTER — Other Ambulatory Visit: Payer: Self-pay | Admitting: Neurology

## 2018-07-01 ENCOUNTER — Ambulatory Visit (INDEPENDENT_AMBULATORY_CARE_PROVIDER_SITE_OTHER): Payer: Medicare Other | Admitting: Internal Medicine

## 2018-07-01 ENCOUNTER — Encounter: Payer: Self-pay | Admitting: Internal Medicine

## 2018-07-01 VITALS — BP 110/60 | HR 78 | Temp 98.8°F | Ht 62.0 in | Wt 194.0 lb

## 2018-07-01 DIAGNOSIS — F0391 Unspecified dementia with behavioral disturbance: Secondary | ICD-10-CM | POA: Diagnosis not present

## 2018-07-01 DIAGNOSIS — N183 Chronic kidney disease, stage 3 unspecified: Secondary | ICD-10-CM

## 2018-07-01 DIAGNOSIS — I10 Essential (primary) hypertension: Secondary | ICD-10-CM | POA: Diagnosis not present

## 2018-07-01 DIAGNOSIS — I5032 Chronic diastolic (congestive) heart failure: Secondary | ICD-10-CM | POA: Diagnosis not present

## 2018-07-01 DIAGNOSIS — K219 Gastro-esophageal reflux disease without esophagitis: Secondary | ICD-10-CM | POA: Diagnosis not present

## 2018-07-01 DIAGNOSIS — R5382 Chronic fatigue, unspecified: Secondary | ICD-10-CM | POA: Diagnosis not present

## 2018-07-01 DIAGNOSIS — E559 Vitamin D deficiency, unspecified: Secondary | ICD-10-CM | POA: Diagnosis not present

## 2018-07-01 DIAGNOSIS — I6529 Occlusion and stenosis of unspecified carotid artery: Secondary | ICD-10-CM | POA: Diagnosis not present

## 2018-07-01 MED ORDER — CHOLECALCIFEROL 50 MCG (2000 UT) PO CAPS: 2000.0000 [IU] | ORAL_CAPSULE | Freq: Every day | ORAL | 3 refills | Status: DC

## 2018-07-01 NOTE — Patient Instructions (Addendum)
Weight consistently at the same time each day.  Give lasix if weight goes up 3-5 lbs in 1 day.    Follow blood pressure medication parameters.    I agree with the changes Dr. Harrington Challenger made to your medications.    Stop calcium with D.  Start D3 2000 units daily for your bones.

## 2018-07-01 NOTE — Progress Notes (Signed)
Location:  [facility] clinic Provider: Jiana Lemaire L. Mariea Clonts, D.O., C.M.D.  Code Status: full code--pt became very agitated when asked about advance directives Goals of Care:  Advanced Directives 07/01/2018  Does Patient Have a Medical Advance Directive? Yes  Type of Advance Directive -  Does patient want to make changes to medical advance directive? No - Patient declined  Copy of [place] in Chart? -  Would patient like information on creating a medical advance directive? No - Patient declined     Chief Complaint  Patient presents with  . Acute Visit    discuss medications    HPI: Patient is a 82 y.o. female seen today for an acute visit to discuss her medications and see if anymore should be reduced or changed to improve quality of life.  Her daughter and her brother are here with her.  Her brother manages the medications.  Her daughter lives in [place] but comes up for appts sometimes.    Goal is increased quality of life.  wants to eliminate meds with side effects of concern.    Taking zofran before breakfast each morning due to nausea.  She is on palliative care and [name] with [facility] made this recommendation.  It has been effective.  Dr. Harrington Challenger reduced several meds.  She's been out to several appts this week and doing better.     Past Medical History:  Diagnosis Date  . Arthritis   . Carotid artery disease ([place])    a. s/p L CEA. b. followed by [facility].  . Chronic diastolic CHF (congestive heart failure) ([place])   . CKD (chronic kidney disease), stage III ([place])   . COPD (chronic obstructive pulmonary disease) ([place])   . Diverticulosis   . Fall at home Sept. 2013  . Hyperlipidemia   . Hypertension   . Internal hemorrhoid   . Memory disorder 10/24/2014    Past Surgical History:  Procedure Laterality Date  . APPENDECTOMY    . BREAST REDUCTION SURGERY    . CAROTID ENDARTERECTOMY     left CEA  . CATARACT EXTRACTION Bilateral   . COLONOSCOPY  2015  . EYE SURGERY       cataracts removed, bilaterally   . skin cancer removal    . TONSILLECTOMY      Allergies  Allergen Reactions  . Lidocaine Anaphylaxis, Swelling, Rash and Other (See Comments)    Any of the " [facility] "  . Other Other (See Comments)    All drugs that end in "cane'-  novacane etc. Daughter states patient almost died when she had it when she was born    Outpatient Encounter Medications as of 07/01/2018  Medication Sig  . albuterol (PROVENTIL) (2.5 MG/3ML) 0.083% nebulizer solution Take 6 mLs (5 mg total) by nebulization every 4 (four) hours as needed for wheezing or shortness of breath.  . ARTIFICIAL TEAR OP Place 2 drops into both eyes daily.   Marland Kitchen azelastine (ASTELIN) 0.1 % nasal spray Place 2 sprays into both nostrils 2 (two) times daily. Use in each nostril as directed  . Calcium Carbonate-Vitamin D (CALCIUM-D PO) Take 1 tablet by mouth every evening.  . diclofenac sodium (VOLTAREN) 1 % GEL Apply 1 application topically 2 (two) times daily as needed (shoulder and knee pain).  . Dietary Management Product (AXONA) packet Take 40 g by mouth daily.  Marland Kitchen donepezil (ARICEPT) 10 MG tablet TAKE 1 TABLET BY MOUTH ONCE DAILY. Call to schedule f/u in November 2019  .  fluticasone (FLONASE) 50 MCG/ACT nasal spray Place 2 sprays into both nostrils daily.  . fluticasone furoate-vilanterol (BREO ELLIPTA) 100-25 MCG/INH AEPB Inhale 1 puff into the lungs daily.  . furosemide (LASIX) 20 MG tablet Take 1 tablet daily, take 1 tablet daily as needed, for weight gain of 3-5 lbs  . ipratropium (ATROVENT) 0.06 % nasal spray USE 2 SPRAYS IN EACH NOSTRIL TWICE DAILY  . ipratropium-albuterol (DUONEB) 0.5-2.5 (3) MG/3ML SOLN inhale contents of 1 vial in nebulizer every 6 hours  . meloxicam (MOBIC) 7.5 MG tablet Take 1 tablet by mouth 2 (two) times daily.  . memantine (NAMENDA) 10 MG tablet Take 1 tablet (10 mg total) by mouth 2 (two) times daily.  . Multiple Vitamins-Minerals (PRESERVISION AREDS 2) CAPS Take 1  capsule by mouth 2 (two) times daily.   . nebivolol (BYSTOLIC) 5 MG tablet Take 0.5 tablets (2.5 mg total) by mouth daily. Hold for systolic BP less than 093.  If systolic BP over 818 give whole tablet  . ondansetron (ZOFRAN ODT) 4 MG disintegrating tablet Take one in  the morning  . pantoprazole (PROTONIX) 40 MG tablet Take 1 tablet (40 mg total) by mouth daily.  . Probiotic Product (PROBIOTIC DAILY PO) Take 1 capsule by mouth daily.   No facility-administered encounter medications on file as of 07/01/2018.     Review of Systems:  Review of Systems  Constitutional: Positive for malaise/fatigue. Negative for chills and fever.       Wt gain since cardio appt  HENT: Negative for congestion.   Eyes:       Macular degeneration  Respiratory: Negative for cough and shortness of breath.   Cardiovascular: Positive for leg swelling. Negative for chest pain and palpitations.       Right leg more swollen than left chronic per daughter  Gastrointestinal: Negative for abdominal pain, blood in stool, constipation, heartburn, melena, nausea and vomiting.  Genitourinary: Negative for dysuria.  Musculoskeletal: Positive for joint pain. Negative for falls.  Neurological: Negative for dizziness and loss of consciousness.  Psychiatric/Behavioral: Positive for memory loss. Negative for depression. The patient is not nervous/anxious and does not have insomnia.     Health Maintenance  Topic Date Due  . INFLUENZA VACCINE  07/01/2018  . TETANUS/TDAP  01/17/2028  . DEXA SCAN  Completed  . PNA vac Low Risk Adult  Completed    Physical Exam: Vitals:   07/01/18 1453  BP: 110/60  Pulse: 78  Temp: 98.8 F (37.1 C)  TempSrc: Oral  SpO2: 93%  Weight: 194 lb (88 kg)  Height: 5\' 2"  (1.575 m)   Body mass index is 35.48 kg/m. Physical Exam  Constitutional: She appears well-developed and well-nourished. No distress.  Eyes:  glasses  Cardiovascular: Normal rate, regular rhythm, normal heart sounds and  intact distal pulses.  Pulmonary/Chest: Effort normal and breath sounds normal. No respiratory distress.  Abdominal: Bowel sounds are normal. She exhibits no distension.  Musculoskeletal:  Ambulates w/o assistive device  Neurological: She is alert.  Oriented to person, poor short term memory over mins  Skin: Skin is warm and dry. Capillary refill takes less than 2 seconds.  Psychiatric:  Flat affect    Labs reviewed: Basic Metabolic Panel: Recent Labs    08/13/17 1335  12/10/17 1349 01/08/18 1150 06/21/18 0000  NA  --    < > 143 144 142  K  --    < > 4.7 3.9 4.1  CL  --    < > 105  106 104  CO2  --    < > 25 25 24   GLUCOSE  --    < > 80 112* 97  BUN  --    < > 22 19 17   CREATININE  --    < > 1.29* 1.17* 1.16*  CALCIUM  --    < > 9.2 9.1 8.6*  TSH 0.888  --   --   --  0.603   < > = values in this interval not displayed.   Liver Function Tests: Recent Labs    12/10/17 1349 01/08/18 1150 06/21/18 0000  AST 26 19 20   ALT 17 17 15   ALKPHOS  --  72 56  BILITOT 1.3* 0.8 1.2  PROT 6.1 5.9* 5.5*  ALBUMIN  --  3.6 3.3*   No results for input(s): LIPASE, AMYLASE in the last 8760 hours. No results for input(s): AMMONIA in the last 8760 hours. CBC: Recent Labs    11/27/17 1221 12/10/17 1349 01/08/18 1150 06/21/18 0000  WBC 11.1* 11.7* 9.9 8.1  NEUTROABS  --  8,295* 6.3  --   HGB 13.0 14.7 13.7 14.0  HCT 40.1 45.5* 42.7 43.8  MCV 89 86.8 88 92  PLT 249 196  --  205   Lipid Panel: Recent Labs    12/10/17 1349 06/21/18 0000  CHOL 204* 189  HDL 70 58  LDLCALC 111* 103*  TRIG 123 140  CHOLHDL 2.9 3.3   Lab Results  Component Value Date   HGBA1C 5.2 12/10/2017    Assessment/Plan 1. Chronic fatigue - too early to tell if bp med changes will impact this fatigue--agree with revisions by Dr. Harrington Challenger - Cholecalciferol (D3 SUPER STRENGTH) 2000 units CAPS; Take 1 capsule (2,000 Units total) by mouth daily.  Dispense: 30 each; Refill: 3 -sounds like meds are not  always given per schedule due to patient being too tired to take them per pt's brother -I wondered if he was remembering the instructions himself b/c several of the meds were being given very differently from the instructions  2. Dementia with behavioral disturbance, unspecified dementia type -gradually progressive--maintained on aricept and namenda -apparently, she is irritable and tired in the mornings, does not eat well and falls frequently  3. Gastroesophageal reflux disease without esophagitis -cont PPI b/c this could manifest as nausea  -cont zofran before breakfast which has helped her nausea -probiotic stopped b/c her brother has not been giving it anyway  4. Essential hypertension -bp still low.  She got a 1/2 a bystolic this am even though bp was over 086 systolic, but now she is just 110 here; she's to get a full bystolic over 578 systolic; she gets dizzy when bp low -bottom parameter may need to be increased if BP readings remaining under 120 consistently   5. CKD (chronic kidney disease), stage III (HCC) -Avoid nephrotoxic agents like nsaids, dose adjust renally excreted meds, hydrate.  6. Chronic diastolic heart failure (HCC) -lasix daily was stopped and she's now on lasix 20mg  po dialy prn weight gain of 3-5 lbs; however, her brother has not been making sure she's weighed and weight did go up since her cardiology appt  7.  Vitamin D deficiency -had been repleted and pt was supposedly taking ca with D and additional 1000 units of vitamin D but the 1000 units was not on our med list -recommended d/c ca with D (due to constipation and large pills difficult to take)--just take D3 2000 units daily -her daughter wants  her D3 level rechecked in a month and wants her blood drawn through the cardiology office at that time  Labs/tests ordered:  No new Next appt:  09/01/2018, f/u 3 mos with Janett Billow  Dolores Ewing L. Abigayl Hor, D.O. [facility] [address], [place] [address] Cell Phone (Mon-Fri 8am-5pm):  [phone] On Call:  [phone] & follow prompts after 5pm & weekends Office Phone:  [phone] Office Fax:  [phone]

## 2018-07-02 ENCOUNTER — Encounter (INDEPENDENT_AMBULATORY_CARE_PROVIDER_SITE_OTHER): Payer: Self-pay

## 2018-07-02 ENCOUNTER — Telehealth: Payer: Self-pay | Admitting: *Deleted

## 2018-07-02 NOTE — Telephone Encounter (Signed)
Sent message via MyChart to tell patient labs can be drawn prior to next ov.  Orders placed for BMET and CBC

## 2018-07-02 NOTE — Telephone Encounter (Signed)
Contacted and spoke with patient's brother [name] to schedule visit for today, as this RN has been seeing patient weekly for the past 3 weeks. [name] states that he feels that patient is doing well this week and condition is stable. She was seen by her Cardiologist on 06/28/18 and some of her medications have changed. Lipitor was discontinued, Furosemide changed to PRN, Vitamin D3 increased to 2000 U daily. Will follow up again next week.

## 2018-07-20 ENCOUNTER — Other Ambulatory Visit: Payer: Self-pay

## 2018-07-20 MED ORDER — FLUTICASONE FUROATE-VILANTEROL 100-25 MCG/INH IN AEPB: 1.0000 | INHALATION_SPRAY | Freq: Every day | RESPIRATORY_TRACT | 5 refills | Status: DC

## 2018-07-20 NOTE — Telephone Encounter (Signed)
A refill request was received from pharmacy for breo ellipta 100-25 mcg inhaler. Rx was sent to the pharmacy electronically.

## 2018-07-28 ENCOUNTER — Other Ambulatory Visit: Payer: Medicare Other | Admitting: *Deleted

## 2018-07-28 DIAGNOSIS — Z515 Encounter for palliative care: Secondary | ICD-10-CM

## 2018-08-03 NOTE — Progress Notes (Signed)
COMMUNITY PALLIATIVE CARE RN NOTE  PATIENT NAME: Virginia [name] DOB: 1936/08/14 MRN: [id number]  PRIMARY CARE PROVIDER: Lauree Chandler, NP  RESPONSIBLE PARTY:  Acct ID - Guarantor Home Phone Work Phone Relationship Acct Type  [id number] Virginia [name], Virginia [name]* [phone]  Self P/F     [place], [place], [place] [address]    PLAN OF CARE and INTERVENTION:  1. ADVANCE CARE PLANNING/GOALS OF CARE: Remain in her home, avoid hospitalizations 2. PATIENT/CAREGIVER EDUCATION: Reinforced Safety/Fall Precautions 3. DISEASE STATUS: Upon arrival, patient sitting up in her recliner in the living room asleep. She is arousable and able to answer simple questions, but did not feel like engaging much. No physical indicators of pain noted. Brother [name] present during visit, who is her caretaker. [name] reports that she has been sleeping more recently and spending more time in bed. He is also noticing that she is physically weaker and requiring more assistance with personal care, ambulating, toileting and dressing. [name] is trying to get her to utilize her walker more, but she is refusing. Intermittent confusion, and has even had moments where she briefly does not recognize family members. [name] reports she does have some pain in her lower back, which has increased some since she is spending more time in bed. Tylenol is being given to help with pain. She is eating 3 meals/day, however only about half of what she is given. Today, she is asleep with lunch sitting in front of her and states that she is not hungry and does not want to eat. [name] is getting her out of the house weekly to the beauty shop and some days just for a ride, out to eat, or shopping. Continues to have someone come to assist her with baths twice weekly but some days she refuses. [name] very tearful in speaking about her continued decline. Will continue to monitor.  HISTORY OF PRESENT ILLNESS:  This is a 82 yo female who resides at home, with her  younger brother as her caregiver. Palliative are continues to follow.  CODE STATUS:  FULL CODE ADVANCED DIRECTIVES: Y MOST FORM: no PPS: 40%   PHYSICAL EXAM:   VITALS: Today's Vitals   07/28/18 1506  BP: (!) 144/96  Pulse: 82  Resp: 17  SpO2: 96%  PainSc: 0-No pain    LUNGS: clear to auscultation  CARDIAC: Cor RRR EXTREMITIES: Trace edema to bilateral lower legs SKIN: Exposed skin dry and intact  NEURO: Alert and oriented x 2 (person/place), intermittent confusion, ambulatory   (Duration of visit and documentation 90 minutes)    [name], RN, BSN

## 2018-08-12 ENCOUNTER — Other Ambulatory Visit: Payer: Self-pay | Admitting: Nurse Practitioner

## 2018-08-12 ENCOUNTER — Telehealth: Payer: Self-pay | Admitting: Internal Medicine

## 2018-08-12 DIAGNOSIS — K219 Gastro-esophageal reflux disease without esophagitis: Secondary | ICD-10-CM

## 2018-08-12 NOTE — Telephone Encounter (Signed)
New Message:    Pt c/o swelling: STAT is pt has developed SOB within 24 hours  1) How much weight have you gained and in what time span? Not Sure   2) If swelling, where is the swelling located?  Both Legs  3) Are you currently taking a fluid pill? Yes  4) Are you currently SOB? No   5) Do you have a log of your daily weights (if so, list)? No   6.Have you gained 3 pounds in a day or 5 pounds in a week? NO          7) Have you traveled recently? NO

## 2018-08-12 NOTE — Telephone Encounter (Signed)
Spoke with patient's brother [name]) who said that the patient has swollen feet and cramps in the right lower leg.  She is not SOB and has no other symptoms.  She has not weighed herself lately. The patient's daughter [name]) is heading back into town and would like to speak to [place].    I instructed [name] to give the patient another 20 mg of Lasix as prescribed.  He will do so and monitor a weight tonight and into tomorrow.

## 2018-08-13 NOTE — Telephone Encounter (Signed)
Spoke with patient's brother. Pt doing much better today. She had a leg/muscle cramp that caused her a lot of pain yesterday. Took 1 extra lasix as advised for the swelling. Today she much better. Reviewed upcoming appointments. No further needs at this time.

## 2018-08-20 ENCOUNTER — Other Ambulatory Visit: Payer: Medicare Other | Admitting: *Deleted

## 2018-08-20 ENCOUNTER — Telehealth: Payer: Self-pay | Admitting: Internal Medicine

## 2018-08-20 DIAGNOSIS — Z515 Encounter for palliative care: Secondary | ICD-10-CM

## 2018-08-20 NOTE — Telephone Encounter (Signed)
Walk in pt Form-pt dropping off information for physician. Placed in [place] doc box.

## 2018-08-22 ENCOUNTER — Other Ambulatory Visit: Payer: Self-pay | Admitting: Internal Medicine

## 2018-08-23 ENCOUNTER — Telehealth: Payer: Self-pay | Admitting: Internal Medicine

## 2018-08-23 ENCOUNTER — Telehealth: Payer: Self-pay | Admitting: *Deleted

## 2018-08-23 DIAGNOSIS — E559 Vitamin D deficiency, unspecified: Secondary | ICD-10-CM

## 2018-08-23 DIAGNOSIS — R600 Localized edema: Secondary | ICD-10-CM

## 2018-08-23 DIAGNOSIS — I5032 Chronic diastolic (congestive) heart failure: Secondary | ICD-10-CM

## 2018-08-23 NOTE — Telephone Encounter (Signed)
Spoke to daughter who brought in some papers showing wts and edema  Still with swelling in legs   Pt is not doing much   Not eating well  Palliative nurse comes 1x per month    RECOMMEND  CBC, CMET, TSH, Vit D, BNP

## 2018-08-24 NOTE — Addendum Note (Signed)
Addended by: Rodman Key on: 08/24/2018 12:46 PM   Modules accepted: Orders

## 2018-08-24 NOTE — Telephone Encounter (Signed)
Advised patient's brother Hughes Better [facility]) that VitD is included in upcoming labs.

## 2018-08-24 NOTE — Telephone Encounter (Signed)
Follow up   Pt is calling to make sure the the pt will also have blood work done for her Vitamin D

## 2018-08-24 NOTE — Telephone Encounter (Signed)
Spoke with patient' brother Virginia [name]) and informed of labs scheduled.  He asked to change already scheduled appointment from 9/27 back to 08/30/18.

## 2018-08-25 NOTE — Progress Notes (Signed)
COMMUNITY PALLIATIVE CARE RN NOTE  PATIENT NAME: Virginia [name] DOB: [birth year] MRN: [id number]  PRIMARY CARE PROVIDER: Lauree Chandler, NP  RESPONSIBLE PARTY:  Acct ID - Guarantor Home Phone Work Phone Relationship Acct Type  [id number] LIZ, PINHO* [phone]  Self P/F     [place], [place], [place] [address]    PLAN OF CARE and INTERVENTION:  1. ADVANCE CARE PLANNING/GOALS OF CARE: She wants to remain in her home and avoid hospitalizations 2. PATIENT/CAREGIVER EDUCATION: Reinforced Safe Mobility/Transfers, Fall Prevention 3. DISEASE STATUS: Met with patient and her brother Virginia [name], who is her caregiver in her home. Observed patient ambulating to the living room. Gait steady time of visit. Continues to refuse walker. No recent falls reported. She denies pain at this time, however does experience back pain when she lies in the bed too long. A few days ago, she experienced a severe leg cramp. Virginia [name] is making sure that she takes a Potassium tablet any time she takes Lasix. They are weighing her daily. Weights have been stable. Current wt 193.5 lbs. Weight yesterday 193.2 lbs. Bilateral lower extremity swelling is starting to occur more regularly per Rosana Hoes, so Lasix has been required more regularly. Continues with 5 mg of Norvasc when BP is elevated, otherwise 2.5 mg is given. Her memory is progressively getting worse. At times she does not recognize her family members. She has someone to come and help with her showers twice weekly, and on Tuesdays someone visits to exercise with her. She continues to travel outside of her home for lunch, shopping and hair appointments. Some days she requires more assistance with bathing, dressing and toileting. Intermittent incontinence of both bowel and bladder. Intake remains variable. Virginia [name] has ordered food from a delivery service to see if more variety will help improve her appetite. Will continue to monitor.   HISTORY OF PRESENT ILLNESS:  This is a 82 yo  female who resides in her home with her brother Virginia [name] as caregiver. Palliative Care Team continues to follow.  CODE STATUS: FULL CODE  ADVANCED DIRECTIVES: Y MOST FORM: no PPS: 40%   PHYSICAL EXAM:   VITALS: Today's Vitals   08/20/18 1548  BP: 132/88  Pulse: 79  Resp: 18  Weight: 193 lb 6.4 oz (87.7 kg)  Height: 5' 2"  (1.575 m)  PainSc: 0-No pain    LUNGS: clear to auscultation  CARDIAC: Cor RRR EXTREMITIES: Trace edema to bilateral lower extremities SKIN: Exposed skin is dry and intact  NEURO: Alert and oriented to person/place, intermittent confusion/forgetfulness, pleasant mood, ambulatory   (Duration of visit and documentation 60 minutes)    [name], RN, BSN

## 2018-08-27 ENCOUNTER — Other Ambulatory Visit: Payer: Self-pay | Admitting: Nurse Practitioner

## 2018-08-27 ENCOUNTER — Other Ambulatory Visit: Payer: Medicare Other

## 2018-08-30 ENCOUNTER — Other Ambulatory Visit: Payer: Medicare Other

## 2018-08-30 ENCOUNTER — Other Ambulatory Visit: Payer: Medicare Other | Admitting: *Deleted

## 2018-08-30 DIAGNOSIS — R600 Localized edema: Secondary | ICD-10-CM | POA: Diagnosis not present

## 2018-08-30 DIAGNOSIS — M19011 Primary osteoarthritis, right shoulder: Secondary | ICD-10-CM | POA: Diagnosis not present

## 2018-08-30 DIAGNOSIS — E559 Vitamin D deficiency, unspecified: Secondary | ICD-10-CM

## 2018-08-30 DIAGNOSIS — I5032 Chronic diastolic (congestive) heart failure: Secondary | ICD-10-CM | POA: Diagnosis not present

## 2018-08-30 DIAGNOSIS — M1711 Unilateral primary osteoarthritis, right knee: Secondary | ICD-10-CM | POA: Diagnosis not present

## 2018-08-31 ENCOUNTER — Telehealth: Payer: Self-pay | Admitting: *Deleted

## 2018-08-31 DIAGNOSIS — I5032 Chronic diastolic (congestive) heart failure: Secondary | ICD-10-CM

## 2018-08-31 LAB — COMPREHENSIVE METABOLIC PANEL
ALBUMIN: 3.8 g/dL (ref 3.5–4.7)
ALT: 13 IU/L (ref 0–32)
AST: 19 IU/L (ref 0–40)
Albumin/Globulin Ratio: 1.9 (ref 1.2–2.2)
Alkaline Phosphatase: 67 IU/L (ref 39–117)
BUN / CREAT RATIO: 14 (ref 12–28)
BUN: 20 mg/dL (ref 8–27)
Bilirubin Total: 0.8 mg/dL (ref 0.0–1.2)
CO2: 27 mmol/L (ref 20–29)
CREATININE: 1.39 mg/dL — AB (ref 0.57–1.00)
Calcium: 8.7 mg/dL (ref 8.7–10.3)
Chloride: 99 mmol/L (ref 96–106)
GFR calc Af Amer: 41 mL/min/{1.73_m2} — ABNORMAL LOW (ref >59)
GFR, EST NON AFRICAN AMERICAN: 36 mL/min/{1.73_m2} — AB (ref >59)
GLOBULIN, TOTAL: 2 g/dL (ref 1.5–4.5)
GLUCOSE: 131 mg/dL — AB (ref 65–99)
Potassium: 4.8 mmol/L (ref 3.5–5.2)
SODIUM: 141 mmol/L (ref 134–144)
Total Protein: 5.8 g/dL — ABNORMAL LOW (ref 6.0–8.5)

## 2018-08-31 LAB — CBC
HEMATOCRIT: 43.4 % (ref 34.0–46.6)
Hemoglobin: 13.9 g/dL (ref 11.1–15.9)
MCH: 28.6 pg (ref 26.6–33.0)
MCHC: 32 g/dL (ref 31.5–35.7)
MCV: 89 fL (ref 79–97)
Platelets: 276 10*3/uL (ref 150–450)
RBC: 4.86 x10E6/uL (ref 3.77–5.28)
RDW: 14.9 % (ref 12.3–15.4)
WBC: 7.4 10*3/uL (ref 3.4–10.8)

## 2018-08-31 LAB — PRO B NATRIURETIC PEPTIDE: NT-Pro BNP: 345 pg/mL (ref 0–738)

## 2018-08-31 LAB — TSH: TSH: 0.422 u[IU]/mL — AB (ref 0.450–4.500)

## 2018-08-31 LAB — VITAMIN D 25 HYDROXY (VIT D DEFICIENCY, FRACTURES): VIT D 25 HYDROXY: 39.9 ng/mL (ref 30.0–100.0)

## 2018-08-31 NOTE — Telephone Encounter (Signed)
-----   Message from Fay Records, MD sent at 08/31/2018  7:46 AM EDT ----- CBC is normal Vit D is OK  Fluid number is better   Cr slightly higher (kidney function is slightly effected) I would follow for now    REpeat BMET in a few wks with BNP Thyroid stimulating hormone is light off  I do not think it is clinically significant at that difference Would repeat in about 3 months

## 2018-08-31 NOTE — Telephone Encounter (Signed)
Reviewed results with patient's brother, [name] [facility]). Pt to return 10/28 for BMET, BNP Plan for TSH in 3 months.

## 2018-09-01 ENCOUNTER — Ambulatory Visit (INDEPENDENT_AMBULATORY_CARE_PROVIDER_SITE_OTHER): Payer: Medicare Other | Admitting: Nurse Practitioner

## 2018-09-01 ENCOUNTER — Encounter: Payer: Self-pay | Admitting: Nurse Practitioner

## 2018-09-01 ENCOUNTER — Other Ambulatory Visit: Payer: Self-pay | Admitting: Internal Medicine

## 2018-09-01 VITALS — BP 130/84 | HR 82 | Temp 98.8°F | Ht 62.0 in | Wt 195.0 lb

## 2018-09-01 DIAGNOSIS — N183 Chronic kidney disease, stage 3 unspecified: Secondary | ICD-10-CM

## 2018-09-01 DIAGNOSIS — I6529 Occlusion and stenosis of unspecified carotid artery: Secondary | ICD-10-CM

## 2018-09-01 DIAGNOSIS — F0391 Unspecified dementia with behavioral disturbance: Secondary | ICD-10-CM

## 2018-09-01 DIAGNOSIS — M150 Primary generalized (osteo)arthritis: Secondary | ICD-10-CM | POA: Diagnosis not present

## 2018-09-01 DIAGNOSIS — K219 Gastro-esophageal reflux disease without esophagitis: Secondary | ICD-10-CM

## 2018-09-01 DIAGNOSIS — Z23 Encounter for immunization: Secondary | ICD-10-CM | POA: Diagnosis not present

## 2018-09-01 DIAGNOSIS — J449 Chronic obstructive pulmonary disease, unspecified: Secondary | ICD-10-CM | POA: Diagnosis not present

## 2018-09-01 DIAGNOSIS — M159 Polyosteoarthritis, unspecified: Secondary | ICD-10-CM

## 2018-09-01 DIAGNOSIS — F17200 Nicotine dependence, unspecified, uncomplicated: Secondary | ICD-10-CM | POA: Diagnosis not present

## 2018-09-01 MED ORDER — MELOXICAM 7.5 MG PO TABS: 7.5000 mg | ORAL_TABLET | Freq: Every day | ORAL | 0 refills | Status: DC | PRN

## 2018-09-01 NOTE — Progress Notes (Signed)
Careteam: Patient Care Team: Lauree Chandler, NP as PCP - General (Nurse Practitioner) Fay Records, MD as PCP - Cardiology (Cardiology) Tanda Rockers, MD as Consulting Physician (Pulmonary Disease) Conan Bowens, RN as Registered Nurse [facility] and Palliative Medicine)  Advanced Directive information Does Patient Have a Medical Advance Directive?: Yes, Type of Advance Directive: Healthcare Power of [place];Living will  Allergies  Allergen Reactions  . Lidocaine Anaphylaxis, Swelling, Rash and Other (See Comments)    Any of the " [facility] "  . Other Other (See Comments)    All drugs that end in "cane'-  novacane etc. Daughter states patient almost died when she had it when she was born    Chief Complaint  Patient presents with  . Medical Management of Chronic Issues    Pt is being seen for a 3 month routine visit.   . Immunizations    pt wants flu vaccine today     HPI: Patient is a 82 y.o. female seen in the office today for routine follow up.  Pt had lab work done at cardiologist.   TSH- marginally low, follow up TSH scheduled in 3 month  Vit D def- taking 2000 units daily, Vit d 39.9  HTN- blood pressure at goal. Denies dizziness. Blood pressure remains good at home. No low BP, occasional high. bystolic 5 mg taking 1/2 tablet daily except if sbp over 150 will get whole tablet.   CKD-Cr slightly worse  OA- pain in multiple joints- had cortisone injections recently which has significantly helped. Taking mobic twice daily.   GERD- has made diet modifications. Continues to have nausea and uses Protonix 40 mg daily.   LE edema- improved, keeping feet up. Currently taking lasix 20 mg daily   COPD- quit cigarettes for years then in the last few months started smoking again. 4-6 cigarettes daily had stopped for years but recently restarted. Continues to use breo daily.   Memory loss- significantly declined. continues on aricept and namenda.   Review  of Systems:  Review of Systems  Unable to perform ROS: Dementia    Past Medical History:  Diagnosis Date  . Arthritis   . Carotid artery disease ([place])    a. s/p L CEA. b. followed by [facility].  . Chronic diastolic CHF (congestive heart failure) ([place])   . CKD (chronic kidney disease), stage III ([place])   . COPD (chronic obstructive pulmonary disease) ([place])   . Diverticulosis   . Fall at home Sept. 2013  . Hyperlipidemia   . Hypertension   . Internal hemorrhoid   . Memory disorder 10/24/2014   Past Surgical History:  Procedure Laterality Date  . APPENDECTOMY    . BREAST REDUCTION SURGERY    . CAROTID ENDARTERECTOMY     left CEA  . CATARACT EXTRACTION Bilateral   . COLONOSCOPY  2015  . EYE SURGERY     cataracts removed, bilaterally   . skin cancer removal    . TONSILLECTOMY     Social History:   reports that she has been smoking cigarettes. She has a 5.00 pack-year smoking history. She has never used smokeless tobacco. She reports that she does not drink alcohol or use drugs.  Family History  Problem Relation Age of Onset  . Heart disease Father        Before age 56  . Hypertension Father   . Heart attack Father   . Cancer Mother 7       Brain  .  Dementia Brother   . Cancer Maternal Aunt 61       breast cancer  . Diabetes Maternal Aunt   . Heart failure Maternal Grandmother   . Diabetes Maternal Grandmother   . Stroke Neg Hx     Medications: Patient's Medications  New Prescriptions   No medications on file  Previous Medications   ALBUTEROL (PROVENTIL) (2.5 MG/3ML) 0.083% NEBULIZER SOLUTION    Take 6 mLs (5 mg total) by nebulization every 4 (four) hours as needed for wheezing or shortness of breath.   ARTIFICIAL TEAR OP    Place 2 drops into both eyes daily.    AZELASTINE (ASTELIN) 0.1 % NASAL SPRAY    Place 2 sprays into both nostrils 2 (two) times daily. Use in each nostril as directed   CHOLECALCIFEROL (D3 SUPER STRENGTH) 2000 UNITS CAPS    Take 1 capsule (2,000  Units total) by mouth daily.   DICLOFENAC SODIUM (VOLTAREN) 1 % GEL    Apply 1 application topically 2 (two) times daily as needed (shoulder and knee pain).   DONEPEZIL (ARICEPT) 10 MG TABLET    TAKE 1 TABLET BY MOUTH ONCE DAILY. Call to schedule f/u in November 2019   FLUTICASONE (FLONASE) 50 MCG/ACT NASAL SPRAY    Place 2 sprays into both nostrils daily.   FLUTICASONE FUROATE-VILANTEROL (BREO ELLIPTA) 100-25 MCG/INH AEPB    Inhale 1 puff into the lungs daily.   FUROSEMIDE (LASIX) 20 MG TABLET    TAKE 1 TABLET BY MOUTH ONCE DAILY   IPRATROPIUM (ATROVENT) 0.06 % NASAL SPRAY    USE 2 SPRAYS IN EACH NOSTRIL TWICE DAILY   IPRATROPIUM-ALBUTEROL (DUONEB) 0.5-2.5 (3) MG/3ML SOLN    inhale contents of 1 vial in nebulizer every 6 hours   MELOXICAM (MOBIC) 7.5 MG TABLET    Take 1 tablet by mouth 2 (two) times daily. With food   MEMANTINE (NAMENDA) 10 MG TABLET    Take 1 tablet (10 mg total) by mouth 2 (two) times daily.   MULTIPLE VITAMINS-MINERALS (PRESERVISION AREDS 2) CAPS    Take 1 capsule by mouth 2 (two) times daily.    NEBIVOLOL (BYSTOLIC) 5 MG TABLET    Take 0.5 tablets (2.5 mg total) by mouth daily. Hold for systolic BP less than 258.  If systolic BP over 527 give whole tablet   ONDANSETRON (ZOFRAN-ODT) 4 MG DISINTEGRATING TABLET    TAKE 1 TABLET BY MOUTH IN THE MORNING   PANTOPRAZOLE (PROTONIX) 40 MG TABLET    TAKE 1 TABLET BY MOUTH ONCE DAILY  Modified Medications   No medications on file  Discontinued Medications   No medications on file     Physical Exam:  Vitals:   09/01/18 1417  BP: 130/84  Pulse: 82  Temp: 98.8 F (37.1 C)  TempSrc: Oral  SpO2: 95%  Weight: 195 lb (88.5 kg)  Height: 5\' 2"  (1.575 m)   Body mass index is 35.67 kg/m.  Physical Exam  Constitutional: She appears well-developed and well-nourished. No distress.  HENT:  Head: Normocephalic and atraumatic.  Eyes: Pupils are equal, round, and reactive to light.  glasses  Neck: Normal range of motion.    Cardiovascular: Normal rate, regular rhythm, normal heart sounds and intact distal pulses.  Pulmonary/Chest: Effort normal and breath sounds normal. No respiratory distress.  Abdominal: Soft. Bowel sounds are normal. She exhibits no distension.  Musculoskeletal: She exhibits edema (trace bilaterally).  Neurological: She is alert.  Oriented to person  Skin: Skin is warm and dry.  Capillary refill takes less than 2 seconds.  Psychiatric:  Flat affect    Labs reviewed: Basic Metabolic Panel: Recent Labs    01/08/18 1150 06/21/18 0000 08/30/18 1423  NA 144 142 141  K 3.9 4.1 4.8  CL 106 104 99  CO2 25 24 27   GLUCOSE 112* 97 131*  BUN 19 17 20   CREATININE 1.17* 1.16* 1.39*  CALCIUM 9.1 8.6* 8.7  TSH  --  0.603 0.422*   Liver Function Tests: Recent Labs    01/08/18 1150 06/21/18 0000 08/30/18 1423  AST 19 20 19   ALT 17 15 13   ALKPHOS 72 56 67  BILITOT 0.8 1.2 0.8  PROT 5.9* 5.5* 5.8*  ALBUMIN 3.6 3.3* 3.8   No results for input(s): LIPASE, AMYLASE in the last 8760 hours. No results for input(s): AMMONIA in the last 8760 hours. CBC: Recent Labs    12/10/17 1349 01/08/18 1150 06/21/18 0000 08/30/18 1423  WBC 11.7* 9.9 8.1 7.4  NEUTROABS 8,295* 6.3  --   --   HGB 14.7 13.7 14.0 13.9  HCT 45.5* 42.7 43.8 43.4  MCV 86.8 88 92 89  PLT 196  --  205 276   Lipid Panel: Recent Labs    12/10/17 1349 06/21/18 0000  CHOL 204* 189  HDL 70 58  LDLCALC 111* 103*  TRIG 123 140  CHOLHDL 2.9 3.3   TSH: Recent Labs    06/21/18 0000 08/30/18 1423  TSH 0.603 0.422*   A1C: Lab Results  Component Value Date   HGBA1C 5.2 12/10/2017     Assessment/Plan 1. Need for influenza vaccination - Flu vaccine HIGH DOSE PF (Fluzone High dose)  2. CKD (chronic kidney disease), stage III ([place]) Cr increased on recent labs. Encourage proper hydration and to avoid NSAIDS (Aleve, Advil, Motrin, Ibuprofen), follow up lab has been ordered by cardiologist.   3. Gastroesophageal  reflux disease without esophagitis Stable with dietary modifications and Protonix.  4. Dementia with behavioral disturbance, unspecified dementia type ([place]) -progressive decline. Continues on namenda and aricept   5. COPD GOLD IV Stable at this time, continues on breo; pt has started smoking again and educated on the adverse effect.   6. Primary osteoarthritis involving multiple joints -has been using mobic twice daily routinely, with decrease in renal function discussed limiting/avoiding NSAIDS. Encouraged not to use routinely but PRM  - meloxicam (MOBIC) 7.5 MG tablet; Take 1 tablet (7.5 mg total) by mouth daily as needed for pain. With food; Refill: 0  7. Smoker -pt has started smoking again, encouraged cessation   Next appt: 12/02/2018 Carlos American. [place], [facility] [phone]

## 2018-09-01 NOTE — Patient Instructions (Signed)
REDUCE meloxicam (mobic) to daily AS NEEDED pain only- to avoid this if possible.  To use tylenol first for pain  STOP SMOKING.

## 2018-09-02 DIAGNOSIS — D485 Neoplasm of uncertain behavior of skin: Secondary | ICD-10-CM | POA: Diagnosis not present

## 2018-09-02 DIAGNOSIS — L304 Erythema intertrigo: Secondary | ICD-10-CM | POA: Diagnosis not present

## 2018-09-02 DIAGNOSIS — C44319 Basal cell carcinoma of skin of other parts of face: Secondary | ICD-10-CM | POA: Diagnosis not present

## 2018-09-02 DIAGNOSIS — D1801 Hemangioma of skin and subcutaneous tissue: Secondary | ICD-10-CM | POA: Diagnosis not present

## 2018-09-02 DIAGNOSIS — L821 Other seborrheic keratosis: Secondary | ICD-10-CM | POA: Diagnosis not present

## 2018-09-02 DIAGNOSIS — Z85828 Personal history of other malignant neoplasm of skin: Secondary | ICD-10-CM | POA: Diagnosis not present

## 2018-09-11 ENCOUNTER — Other Ambulatory Visit: Payer: Self-pay | Admitting: Internal Medicine

## 2018-09-20 ENCOUNTER — Other Ambulatory Visit: Payer: Self-pay | Admitting: Internal Medicine

## 2018-09-22 DIAGNOSIS — Z961 Presence of intraocular lens: Secondary | ICD-10-CM | POA: Diagnosis not present

## 2018-09-22 DIAGNOSIS — H5203 Hypermetropia, bilateral: Secondary | ICD-10-CM | POA: Diagnosis not present

## 2018-09-22 DIAGNOSIS — H52223 Regular astigmatism, bilateral: Secondary | ICD-10-CM | POA: Diagnosis not present

## 2018-09-22 DIAGNOSIS — H524 Presbyopia: Secondary | ICD-10-CM | POA: Diagnosis not present

## 2018-09-22 DIAGNOSIS — H353111 Nonexudative age-related macular degeneration, right eye, early dry stage: Secondary | ICD-10-CM | POA: Diagnosis not present

## 2018-09-22 DIAGNOSIS — H04123 Dry eye syndrome of bilateral lacrimal glands: Secondary | ICD-10-CM | POA: Diagnosis not present

## 2018-09-22 DIAGNOSIS — H353121 Nonexudative age-related macular degeneration, left eye, early dry stage: Secondary | ICD-10-CM | POA: Diagnosis not present

## 2018-09-22 DIAGNOSIS — H353191 Nonexudative age-related macular degeneration, unspecified eye, early dry stage: Secondary | ICD-10-CM | POA: Diagnosis not present

## 2018-09-27 ENCOUNTER — Telehealth: Payer: Self-pay | Admitting: *Deleted

## 2018-09-27 ENCOUNTER — Other Ambulatory Visit: Payer: Medicare Other | Admitting: *Deleted

## 2018-09-27 DIAGNOSIS — I5032 Chronic diastolic (congestive) heart failure: Secondary | ICD-10-CM | POA: Diagnosis not present

## 2018-09-27 NOTE — Telephone Encounter (Signed)
Contacted and spoke with [name] to arrange a home visit. Visit scheduled for 09/29/18 at 1:30p.

## 2018-09-28 LAB — BASIC METABOLIC PANEL
BUN / CREAT RATIO: 14 (ref 12–28)
BUN: 24 mg/dL (ref 8–27)
CO2: 29 mmol/L (ref 20–29)
CREATININE: 1.77 mg/dL — AB (ref 0.57–1.00)
Calcium: 9.1 mg/dL (ref 8.7–10.3)
Chloride: 100 mmol/L (ref 96–106)
GFR calc non Af Amer: 27 mL/min/{1.73_m2} — ABNORMAL LOW (ref >59)
GFR, EST AFRICAN AMERICAN: 31 mL/min/{1.73_m2} — AB (ref >59)
Glucose: 93 mg/dL (ref 65–99)
POTASSIUM: 4.4 mmol/L (ref 3.5–5.2)
SODIUM: 144 mmol/L (ref 134–144)

## 2018-09-28 LAB — PRO B NATRIURETIC PEPTIDE: NT-Pro BNP: 503 pg/mL (ref 0–738)

## 2018-09-29 ENCOUNTER — Other Ambulatory Visit: Payer: Medicare Other | Admitting: *Deleted

## 2018-09-29 DIAGNOSIS — Z515 Encounter for palliative care: Secondary | ICD-10-CM

## 2018-10-01 NOTE — Progress Notes (Signed)
COMMUNITY PALLIATIVE CARE RN NOTE  PATIENT NAME: Virginia [name] DOB: 1936-07-29 MRN: [id number]  PRIMARY CARE PROVIDER: Lauree Chandler, NP  RESPONSIBLE PARTY:  Acct ID - Guarantor Home Phone Work Phone Relationship Acct Type  [id number] WANZA, SZUMSKI* [phone]  Self P/F     [place], [place], [place] [address]    PLAN OF CARE and INTERVENTION:  1. ADVANCE CARE PLANNING/GOALS OF CARE: Remain in her home with her brother as caregiver and avoid going to the hospital 2. PATIENT/CAREGIVER EDUCATION: Reinforced Safe Mobility and Pain Management 3. DISEASE STATUS: Met with patient and her brother [name] in their home. She is sitting up in a chair on her screened porch. She is alert and oriented x 2 (person/place), but remains intermittently confused and forgetful. She recently had labs drawn this week and is awaiting results. Contacted patient's daughter [name], who lives in [place] and placed her on speakerphone as requested. Daughter is concerned that patient does not move around enough. She has someone come weekly to perform exercises with her. Daughter wants him to come at least twice weekly, however patient feels once weekly is enough. Some days she is spending more time in bed due to increased fatigue. She continues with a caregiver that comes twice weekly to assist patient with her showers. Her intake is fair. She feels that she eats enough and eats when she feels hungry. Daughter would like patient to drink more fluids. She continues to take her medications whole with water without difficulty and her brother has not had any problems getting patient to take them recently. She denies pain, however does experience arthritic pain in her back and knees. Tylenol is being given twice daily. Meloxicam available for when pain is more severe. She is able to ambulate independently, but usually holds onto walls and furniture. She uses her walker when travelling outside of the home. She is  intermittently incontinent of urine. Continues on Lasix for edema, and is mainly only given if her weight is elevated or apparent leg/feet swelling. Will continue to monitor.  HISTORY OF PRESENT ILLNESS:  This is a 82 yo female who resides at home with her brother as caretaker. Palliative Care Team continues to follow patient. Will continue to visit monthly and PRN.  CODE STATUS: FULL CODE ADVANCED DIRECTIVES: Y MOST FORM: no PPS: 40%   PHYSICAL EXAM:   VITALS: Today's Vitals   09/29/18 1409  BP: 129/87  Pulse: 87  Resp: 16  PainSc: 0-No pain    LUNGS: clear to auscultation  CARDIAC: Cor RRR EXTREMITIES: No edema SKIN: Exposed skin is dry and intact  NEURO: Alert and oriented x 2, pleasant mood, generalized weakness, ambulatory   (Duration of visit and documentation 75 minutes)    [name], RN, BSN

## 2018-10-07 ENCOUNTER — Telehealth: Payer: Self-pay | Admitting: Internal Medicine

## 2018-10-07 DIAGNOSIS — R7989 Other specified abnormal findings of blood chemistry: Secondary | ICD-10-CM

## 2018-10-07 DIAGNOSIS — Z79899 Other long term (current) drug therapy: Secondary | ICD-10-CM

## 2018-10-07 MED ORDER — FUROSEMIDE 20 MG PO TABS: 20.0000 mg | ORAL_TABLET | ORAL | 3 refills | Status: DC

## 2018-10-07 NOTE — Telephone Encounter (Signed)
Called patient back with Dr. Harrington Challenger' result note. Patient is not drinking a lot. Patient's brother (DPR) will start patient taking Lasix 20 mg every other day and check lab work 10/22/18. Patient's brother verbalized understanding.   Per Dr. Harrington Challenger "Kidney function a little down from previous  Is she eating and drinking the same amounts?  I am reluctant to change meds on this one value unless she isnt drinking  If fluid intake is down then I would swithc lasix to 20 mg every other day.  WOuld check BMET in 2 wks"

## 2018-10-07 NOTE — Telephone Encounter (Signed)
Follow up   Returning call for nurse

## 2018-10-13 ENCOUNTER — Ambulatory Visit: Payer: Medicare Other | Admitting: Adult Health

## 2018-10-14 ENCOUNTER — Ambulatory Visit: Payer: Medicare Other | Admitting: Physician Assistant

## 2018-10-19 ENCOUNTER — Ambulatory Visit: Payer: Medicare Other | Admitting: Physician Assistant

## 2018-10-19 NOTE — Progress Notes (Deleted)
Cardiology Office Note:    Date:  10/19/2018   ID:  Virginia [name], DOB 06-Jun-1936, MRN [id number]  PCP:  Lauree Chandler, NP  Cardiologist:  Dorris Carnes, MD *** Electrophysiologist:  None   Referring MD: Lauree Chandler, NP   No chief complaint on file. ***  History of Present Illness:    Virginia [name] is a 82 y.o. female with diastolic heart failure, COPD on home O2, chronic kidney disease, dementia status post prior left CEA.  She was last seen by Dr. Harrington Challenger in July 2019.  ***  Virginia [name] ***  Prior CV studies:   The following studies were reviewed today:  Arterial US 04/09/2018 Final Interpretation: Right: 30-49% stenosis noted in the common femoral artery.    50-74% stenosis noted in the above-the-knee popliteal    artery.    Two vessel run-off via PTA and ATA Peroneal artery not    visualized. Left: 30-49% stenosis noted in the common femoral artery.   30-49% stenosis noted in the proximal superficial femoral   artery at ostium.    30-49% stenosis noted in the above-the-knee popliteal artery.   Three vessel run-off. *See table(s) above for measurements and observations.  Carotid US 12/21/2017 Final Interpretation: Right Carotid: Velocities in the right ICA are consistent with a 1-39% stenosis. Left Carotid: Velocities in the left ICA are consistent with a 1-39% stenosis,patent carotid endarterectomy site  Echocardiogram 12/07/2014 EF 65-70, normal wall motion, grade 1 diastolic dysfunction, MAC  Nuclear stress test 11/16/14 Low risk stress nuclear study with mild breast attenuation artifact.  LV Ejection Fraction: 83%.    Past Medical History:  Diagnosis Date  . Anxiety 07/08/2017  . Arthritis   . Carotid artery disease ([place])    a. s/p L CEA. b. followed by [facility].  . Carotid stenosis 10/19/2012  . Chronic diastolic CHF (congestive heart failure) ([place])   . CKD (chronic kidney disease), stage III ([place])   . Congestive heart  disease ([place])   . COPD (chronic obstructive pulmonary disease) ([place])   . COPD GOLD IV 12/30/2014   PFTs 12/15/14 FEV1  0.57 (29%) ratio 54 p 17% resp to saba    . Cough 01/31/2016  . Dementia with behavioral disturbance ([place]) 07/08/2017  . Diverticulosis   . Dyspnea   . Essential hypertension 12/02/2014  . Fall at home Sept. 2013  . Gastroesophageal reflux disease 07/08/2017  . Hyperlipidemia   . Hypertension   . Hyponatremia 12/02/2014  . Hypotension 02/01/2017  . Internal hemorrhoid   . Memory disorder 10/24/2014  . Nausea and vomiting 11/30/2014  . Primary osteoarthritis involving multiple joints 07/08/2017  . Vitamin D deficiency 02/15/2015   Surgical Hx: The patient  has a past surgical history that includes Carotid endarterectomy; Tonsillectomy; Appendectomy; Breast reduction surgery; skin cancer removal; Cataract extraction (Bilateral); Colonoscopy (2015); and Eye surgery.   Current Medications: No outpatient medications have been marked as taking for the 10/19/18 encounter (Appointment) with Richardson Dopp T, PA-C.     Allergies:   Lidocaine and Other   Social History   Tobacco Use  . Smoking status: Current Some Day Smoker    Packs/day: 0.25    Years: 20.00    Pack years: 5.00    Types: Cigarettes    Last attempt to quit: 11/30/1994    Years since quitting: 23.9  . Smokeless tobacco: Never Used  . Tobacco comment: 4 or 5 cigarettes per day average  Substance Use Topics  . Alcohol use:  No    Alcohol/week: 0.0 standard drinks  . Drug use: No     Family Hx: The patient's family history includes Cancer (age of onset: 71) in her mother; Cancer (age of onset: 13) in her maternal aunt; Dementia in her brother; Diabetes in her maternal aunt and maternal grandmother; Heart attack in her father; Heart disease in her father; Heart failure in her maternal grandmother; Hypertension in her father. There is no history of Stroke.  ROS:   Please see the history of present illness.    ROS  All other systems reviewed and are negative.   EKGs/Labs/Other Test Reviewed:    EKG:  EKG is *** ordered today.  The ekg ordered today demonstrates ***  Recent Labs: 08/30/2018: ALT 13; Hemoglobin 13.9; Platelets 276; TSH 0.422 09/27/2018: BUN 24; Creatinine, Ser 1.77; NT-Pro BNP 503; Potassium 4.4; Sodium 144   Recent Lipid Panel Lab Results  Component Value Date/Time   CHOL 189 06/21/2018 12:00 AM   TRIG 140 06/21/2018 12:00 AM   HDL 58 06/21/2018 12:00 AM   CHOLHDL 3.3 06/21/2018 12:00 AM   CHOLHDL 2.9 12/10/2017 01:49 PM   LDLCALC 103 (H) 06/21/2018 12:00 AM   LDLCALC 111 (H) 12/10/2017 01:49 PM   LDLDIRECT 142.0 05/24/2015 11:08 AM    Physical Exam:    VS:  There were no vitals taken for this visit.    Wt Readings from Last 3 Encounters:  09/01/18 195 lb (88.5 kg)  08/20/18 193 lb 6.4 oz (87.7 kg)  07/01/18 194 lb (88 kg)     ***Physical Exam  ASSESSMENT & PLAN:    Chronic diastolic CHF (congestive heart failure) (HCC)  Bilateral carotid artery disease, unspecified type ([place])  Essential hypertension  Mixed hyperlipidemia  CKD (chronic kidney disease), stage III (H[facility])  COPD GOLD IV***  Dispo:  No follow-ups on file.   Medication Adjustments/Labs and Tests Ordered: Current medicines are reviewed at length with the patient today.  Concerns regarding medicines are outlined above.  Tests Ordered: No orders of the defined types were placed in this encounter.  Medication Changes: No orders of the defined types were placed in this encounter.   Signed, Richardson Dopp, PA-C  10/19/2018 2:35 PM    [facility] [place], [place], [name]  [address] Phone: [phone]; Fax: [phone]

## 2018-10-20 ENCOUNTER — Encounter: Payer: Self-pay | Admitting: Physician Assistant

## 2018-10-22 ENCOUNTER — Other Ambulatory Visit: Payer: Medicare Other

## 2018-10-26 ENCOUNTER — Other Ambulatory Visit: Payer: Self-pay | Admitting: Nurse Practitioner

## 2018-10-27 ENCOUNTER — Other Ambulatory Visit: Payer: Medicare Other

## 2018-10-29 ENCOUNTER — Other Ambulatory Visit: Payer: Medicare Other | Admitting: *Deleted

## 2018-10-29 DIAGNOSIS — Z515 Encounter for palliative care: Secondary | ICD-10-CM

## 2018-10-29 NOTE — Progress Notes (Signed)
COMMUNITY PALLIATIVE CARE RN NOTE  PATIENT NAME: Virginia [name] DOB: 1936/02/22 MRN: [id number]  PRIMARY CARE PROVIDER: Lauree Chandler, NP  RESPONSIBLE PARTY:  Acct ID - Guarantor Home Phone Work Phone Relationship Acct Type  [id number] [name], [name]* [phone]  Self P/F     [place], [place], [place] [address]    PLAN OF CARE and INTERVENTION:  1. ADVANCE CARE PLANNING/GOALS OF CARE: Remain in her home with brother as caregiver and avoid hospitalizations 2. PATIENT/CAREGIVER EDUCATION: Reinforced Safe Mobility/Transfers, Edema and Pain Management 3. DISEASE STATUS: Met with patient, brother Virginia [name], and daughter Virginia [name] in patient's home. Patient is sitting up at the dining table preparing to eat. She is able to answer simple questions with short replies, however is intermittently confused with very poor short term memory. At times she does not recognize close family members. She denies pain at this time. Daughter reports that last pm, she was experiencing bilateral leg pain she feels from edema. Legs were swollen and grimace noted to patient's face. Lasix was given. Only trace edema noted to bilateral lower extremities today, which is much improved per daughter. She is having more daily of leg swelling but states that Lasix was changed to every other day. Recommended that they do daily weights on patient. Daughter also reports patient increased coughing she feels from fluid build-up. Daughter states patient has Stage 4 kidney disease. They have an appointment next week with the Nephrologist on 11/02/18. Yesterday, she required 2 person assistance with ambulation due to weakness. Today, patient has been using her walker and ambulating independently. She has been spending more time in bed asleep and not travelling outside of the home as often as she used to due to increased weakness and unsteady gait. She is intermittently incontinent of bladder. Requires assistance with bathing/dressing. She  refuses care and is uncooperative at times. Intake has also decreased. Patient only eating maybe 1-2 meals/day. Today she ate 25% of her meal and went right back to bed. Daughter is wanting hospice care for patient when she is eligible. Explained hospice services and referral process. Will continue to assess for hospice eligibility.   HISTORY OF PRESENT ILLNESS:  This is a 82 yo female who resides in her home with brother as caregiver. Daughter is in town visiting indefinitely and is also helping with patient care. Palliative Care Team continues to follow patient. Will continue to visit monthly and PRN.  CODE STATUS: FULL CODE  ADVANCED DIRECTIVES: Y MOST FORM: no PPS: 40%   PHYSICAL EXAM:   VITALS: Today's Vitals   10/29/18 1558  BP: (!) 135/98  Pulse: 91  Resp: 16  PainSc: 0-No pain    LUNGS: clear to auscultation  CARDIAC: Cor RRR EXTREMITIES: Trace edema noted to bilateral lower extremities SKIN: Exposed skin is dry and intact, family denies any wounds/skin breakdown at this time  NEURO: Alert and oriented x 2, intermittent confusion, poor short term memory, ambulatory   (Duration of visit and documentation 75 minutes)    [name], RN, BSN

## 2018-11-01 ENCOUNTER — Other Ambulatory Visit: Payer: Medicare Other | Admitting: *Deleted

## 2018-11-02 DIAGNOSIS — R609 Edema, unspecified: Secondary | ICD-10-CM | POA: Diagnosis not present

## 2018-11-02 DIAGNOSIS — K219 Gastro-esophageal reflux disease without esophagitis: Secondary | ICD-10-CM | POA: Diagnosis not present

## 2018-11-02 DIAGNOSIS — N179 Acute kidney failure, unspecified: Secondary | ICD-10-CM | POA: Diagnosis not present

## 2018-11-02 DIAGNOSIS — N183 Chronic kidney disease, stage 3 (moderate): Secondary | ICD-10-CM | POA: Diagnosis not present

## 2018-11-02 DIAGNOSIS — I129 Hypertensive chronic kidney disease with stage 1 through stage 4 chronic kidney disease, or unspecified chronic kidney disease: Secondary | ICD-10-CM | POA: Diagnosis not present

## 2018-11-02 DIAGNOSIS — R413 Other amnesia: Secondary | ICD-10-CM | POA: Diagnosis not present

## 2018-11-09 ENCOUNTER — Telehealth: Payer: Self-pay | Admitting: *Deleted

## 2018-11-09 NOTE — Telephone Encounter (Signed)
Patient's brother, Hughes Better dropped off a [employer] for himself and stated that, Patient's Provider would have to give a letter to excuse him from [employer] duty stating that he is Engineer, manufacturing.   Form placed in yellow folder in [place] folder to review.   Juror Number: [id number] Panel Number: [id number] Jury Duty Date: 12/30/18

## 2018-11-09 NOTE — Telephone Encounter (Signed)
Brother stated that he is very Disappointed with that decision. Stated that he has very limited time with this and he is sole caregiver. Stated that he will inform her family and they can make the decision of wether the patient will stay at our practice. Hung up.

## 2018-11-09 NOTE — Telephone Encounter (Signed)
Noted, thank you

## 2018-11-09 NOTE — Telephone Encounter (Signed)
Spoke with Virginia [name] and she stated that patient should have other caregivers that could step in and take care of patient while brother is at [employer] duty.   Spoke with Virginia [name], Glass blower/designer and confirmed. Patient bother should be able to present his POA Paperwork. And she agreed with Virginia [name] regarding the Caregivers.   LM for brother, Virginia [name] to call me back.   Placed form back up front under his name for pick up.

## 2018-11-16 ENCOUNTER — Telehealth: Payer: Self-pay | Admitting: *Deleted

## 2018-11-16 ENCOUNTER — Other Ambulatory Visit: Payer: Self-pay | Admitting: *Deleted

## 2018-11-16 MED ORDER — CARBINOXAMINE MALEATE 4 MG PO TABS: 4.0000 mg | ORAL_TABLET | Freq: Three times a day (TID) | ORAL | 5 refills | Status: DC | PRN

## 2018-11-16 NOTE — Telephone Encounter (Signed)
Let's just change to carbinoxamine 4mg  Q8hrs PRN.   Thanks, Salvatore Marvel, MD [facility]

## 2018-11-16 NOTE — Telephone Encounter (Signed)
New prescription has been sent in. Called and left a voicemail advising the patient to call back to discuss medication change.

## 2018-11-16 NOTE — Telephone Encounter (Signed)
Virginia [name] is not covered by Medicare and chances are slim that a PA will be approved. Please advise a different medication option.

## 2018-11-19 ENCOUNTER — Other Ambulatory Visit: Payer: Medicare Other | Admitting: *Deleted

## 2018-11-19 DIAGNOSIS — Z515 Encounter for palliative care: Secondary | ICD-10-CM

## 2018-11-19 NOTE — Progress Notes (Signed)
COMMUNITY PALLIATIVE CARE RN NOTE  PATIENT NAME: Virginia [name] DOB: Jan 05, 1936 MRN: [id number]  PRIMARY CARE PROVIDER: Lauree Chandler, NP  RESPONSIBLE PARTY:  Acct ID - Guarantor Home Phone Work Phone Relationship Acct Type  [id number] TILIA, FASO* [phone]  Self P/F     [address], [place], [place] [address]    PLAN OF CARE and INTERVENTION:  1. ADVANCE CARE PLANNING/GOALS OF CARE: She wants to remain in her home. She is a Full Code 2. PATIENT/CAREGIVER EDUCATION: Reinforced Safe Mobility and Fall Prevention 3. DISEASE STATUS: Met with patient, daughter [name] and brother [name] in patient's home. She is awake and alert, sitting up at her kitchen table. She reports pain in her R shoulder, R upper arm and bilateral knees. She recently took Tylenol 1000 mg to help. Her last steroid injection to her shoulder was in September 2019. Daughter called Orthopedic office today and next injection is scheduled 12/16/17. She was recently seen by Dr. Lorrene Reid with Urology. Daughter states Lasix was increased to 40 mg every other day and Meloxicam discontinued due to the effects on kidney function. 2+ pitting edema noted to bilateral feet/ankles today. She has gained 2 lbs since yesterday. Wt today 193.6 lbs. Daughter is noticing patient getting short of breath more easily with exertion. No dyspnea noted today. Gait is slightly more unsteady today. Ambulates with slight limp. She walks most of the time without using her walker, holding onto furniture/walls. Occasionally, she will use her walker. Daughter states that patient did not recognize her a couple of times over the past few days, however patient does not remember this. She is sleeping more overall and eating decreased portion sizes at meal time per family. Daughter placed a call to Dr. Lorrene Reid today to request guidance/parameters of receiving additional Lasix if needed for leg swelling and what to do for pain management, as patient seems to have  increased pain since Meloxicam was discontinued. She is awaiting a call back. Aide came in to assist patient with a shower, which she does twice weekly. There are times that patient refuses this service. Will continue to monitor.  HISTORY OF PRESENT ILLNESS:  This is a 82 yo female who resides in her home with her brother [name] as caregiver. Palliative Care team continues to follow patient. Will continue to visit patient monthly and PRN.   CODE STATUS: FULL CODE ADVANCED DIRECTIVES: Y MOST FORM: no  PPS: 40%   PHYSICAL EXAM:   VITALS: Today's Vitals   11/19/18 1601  BP: 96/73  Pulse: 88  Resp: 16  Weight: 193 lb 9.6 oz (87.8 kg)  Height: 5' 2"  (1.575 m)    LUNGS: clear to auscultation  CARDIAC: Cor RRR EXTREMITIES: Edema noted to bilateral feet/ankles SKIN: Exposed skin is dry and intact  NEURO: Alert and oriented x 2, intermittent confusion, poor short term memory, ambulatory with supervision   (Duration of visit and documentation 90 minutes)    [name], RN, BSN

## 2018-11-22 ENCOUNTER — Other Ambulatory Visit: Payer: Medicare Other | Admitting: *Deleted

## 2018-11-29 ENCOUNTER — Other Ambulatory Visit: Payer: Self-pay | Admitting: Nurse Practitioner

## 2018-11-29 ENCOUNTER — Other Ambulatory Visit: Payer: Medicare Other

## 2018-11-29 NOTE — Telephone Encounter (Signed)
Per Janett Billow patient needs to get additional refills from GI   I called patient's son and informed him that patient will need to follow-up with Dr.Pyrtle for ongoing nausea. I provided patient's son with Dr.Pyrtle's contact information.

## 2018-11-29 NOTE — Telephone Encounter (Signed)
Please advise if additional refills can be given

## 2018-11-30 ENCOUNTER — Other Ambulatory Visit: Payer: Self-pay

## 2018-11-30 ENCOUNTER — Telehealth: Payer: Self-pay | Admitting: Internal Medicine

## 2018-11-30 MED ORDER — ONDANSETRON 4 MG PO TBDP: ORAL_TABLET | ORAL | 0 refills | Status: DC

## 2018-11-30 NOTE — Telephone Encounter (Signed)
Zofran refilled and family aware that they will need to schedule an OV to continue with refills.

## 2018-11-30 NOTE — Telephone Encounter (Signed)
Pt's daughter [name] called to inform that pt has been having nausea lately. She called pt's PCP to refill zofran but request was denied and pt was referred to [place]. She wants to know what Dr. Hilarie Fredrickson wants to do, she stated that pt has kidney problems and has been dealing with nausea. Please call her.

## 2018-12-02 ENCOUNTER — Ambulatory Visit: Payer: Medicare Other | Admitting: Nurse Practitioner

## 2018-12-02 ENCOUNTER — Encounter: Payer: Self-pay | Admitting: *Deleted

## 2018-12-02 NOTE — Telephone Encounter (Signed)
This encounter was created in error - please disregard.

## 2018-12-06 ENCOUNTER — Ambulatory Visit: Payer: Medicare Other | Admitting: Physician Assistant

## 2018-12-07 ENCOUNTER — Encounter: Payer: Self-pay | Admitting: Physician Assistant

## 2018-12-07 ENCOUNTER — Other Ambulatory Visit: Payer: Medicare Other | Admitting: *Deleted

## 2018-12-07 ENCOUNTER — Ambulatory Visit (INDEPENDENT_AMBULATORY_CARE_PROVIDER_SITE_OTHER): Payer: Medicare Other | Admitting: Physician Assistant

## 2018-12-07 VITALS — BP 118/66 | HR 84 | Ht 62.0 in | Wt 187.1 lb

## 2018-12-07 DIAGNOSIS — N183 Chronic kidney disease, stage 3 unspecified: Secondary | ICD-10-CM

## 2018-12-07 DIAGNOSIS — I10 Essential (primary) hypertension: Secondary | ICD-10-CM | POA: Diagnosis not present

## 2018-12-07 DIAGNOSIS — R413 Other amnesia: Secondary | ICD-10-CM | POA: Diagnosis not present

## 2018-12-07 DIAGNOSIS — I5032 Chronic diastolic (congestive) heart failure: Secondary | ICD-10-CM

## 2018-12-07 DIAGNOSIS — Z79899 Other long term (current) drug therapy: Secondary | ICD-10-CM

## 2018-12-07 DIAGNOSIS — J449 Chronic obstructive pulmonary disease, unspecified: Secondary | ICD-10-CM | POA: Diagnosis not present

## 2018-12-07 DIAGNOSIS — R7989 Other specified abnormal findings of blood chemistry: Secondary | ICD-10-CM

## 2018-12-07 MED ORDER — FUROSEMIDE 20 MG PO TABS: 20.0000 mg | ORAL_TABLET | Freq: Every day | ORAL | 3 refills | Status: DC

## 2018-12-07 NOTE — Patient Instructions (Signed)
Medication Instructions:  Your physician has recommended you make the following change in your medication:   DECREASE: furosemide (lasix) to 20 mg once a day  You may take an extra 20 mg if you have weight gain (>3 lbs in 24 hours, >5 lbs in 1 week) or increased swelling or shortness of breath  If you need a refill on your cardiac medications before your next appointment, please call your pharmacy.   Lab work: None ordered If you have labs (blood work) drawn today and your tests are completely normal, you will receive your results only by: Marland Kitchen MyChart Message (if you have MyChart) OR . A paper copy in the mail If you have any lab test that is abnormal or we need to change your treatment, we will call you to review the results.  Testing/Procedures: None ordered  Follow-Up: At [facility], you and your health needs are our priority.  As part of our continuing mission to provide you with exceptional heart care, we have created designated Provider Care Teams.  These Care Teams include your primary Cardiologist (physician) and Advanced Practice Providers (APPs -  Physician Assistants and Nurse Practitioners) who all work together to provide you with the care you need, when you need it. You will need a follow up appointment in:  6 months.  Please call our office 2 months in advance to schedule this appointment.  You may see Dorris Carnes, MD or one of the following Advanced Practice Providers on your designated Care Team: Richardson Dopp, PA-C [place], [place] . Daune Perch, NP  Any Other Special Instructions Will Be Listed Below (If Applicable). Two Gram Sodium Diet 2000 mg  What is Sodium? Sodium is a mineral found naturally in many foods. The most significant source of sodium in the diet is table salt, which is about 40% sodium.  Processed, convenience, and preserved foods also contain a large amount of sodium.  The body needs only 500 mg of sodium daily to function,  A normal diet provides  more than enough sodium even if you do not use salt.  Why Limit Sodium? A build up of sodium in the body can cause thirst, increased blood pressure, shortness of breath, and water retention.  Decreasing sodium in the diet can reduce edema and risk of heart attack or stroke associated with high blood pressure.  Keep in mind that there are many other factors involved in these health problems.  Heredity, obesity, lack of exercise, cigarette smoking, stress and what you eat all play a role.  General Guidelines:  Do not add salt at the table or in cooking.  One teaspoon of salt contains over 2 grams of sodium.  Read food labels  Avoid processed and convenience foods  Ask your dietitian before eating any foods not dicussed in the menu planning guidelines  Consult your physician if you wish to use a salt substitute or a sodium containing medication such as antacids.  Limit milk and milk products to 16 oz (2 cups) per day.  Shopping Hints:  READ LABELS!! "Dietetic" does not necessarily mean low sodium.  Salt and other sodium ingredients are often added to foods during processing.   Menu Planning Guidelines Food Group Choose More Often Avoid  Beverages (see also the milk group All fruit juices, low-sodium, salt-free vegetables juices, low-sodium carbonated beverages Regular vegetable or tomato juices, commercially softened water used for drinking or cooking  Breads and Cereals Enriched white, wheat, rye and pumpernickel bread, hard rolls and dinner  rolls; muffins, cornbread and waffles; most dry cereals, cooked cereal without added salt; unsalted crackers and breadsticks; low sodium or homemade bread crumbs Bread, rolls and crackers with salted tops; quick breads; instant hot cereals; pancakes; commercial bread stuffing; self-rising flower and biscuit mixes; regular bread crumbs or cracker crumbs  Desserts and Sweets Desserts and sweets mad with mild should be within allowance Instant pudding mixes  and cake mixes  Fats Butter or margarine; vegetable oils; unsalted salad dressings, regular salad dressings limited to 1 Tbs; light, sour and heavy cream Regular salad dressings containing bacon fat, bacon bits, and salt pork; snack dips made with instant soup mixes or processed cheese; salted nuts  Fruits Most fresh, frozen and canned fruits Fruits processed with salt or sodium-containing ingredient (some dried fruits are processed with sodium sulfites        Vegetables Fresh, frozen vegetables and low- sodium canned vegetables Regular canned vegetables, sauerkraut, pickled vegetables, and others prepared in brine; frozen vegetables in sauces; vegetables seasoned with ham, bacon or salt pork  Condiments, Sauces, Miscellaneous  Salt substitute with physician's approval; pepper, herbs, spices; vinegar, lemon or lime juice; hot pepper sauce; garlic powder, onion powder, low sodium soy sauce (1 Tbs.); low sodium condiments (ketchup, chili sauce, mustard) in limited amounts (1 tsp.) fresh ground horseradish; unsalted tortilla chips, pretzels, potato chips, popcorn, salsa (1/4 cup) Any seasoning made with salt including garlic salt, celery salt, onion salt, and seasoned salt; sea salt, rock salt, kosher salt; meat tenderizers; monosodium glutamate; mustard, regular soy sauce, barbecue, sauce, chili sauce, teriyaki sauce, steak sauce, Worcestershire sauce, and most flavored vinegars; canned gravy and mixes; regular condiments; salted snack foods, olives, picles, relish, horseradish sauce, catsup   Food preparation: Try these seasonings Meats:    Pork Sage, onion Serve with applesauce  Chicken Poultry seasoning, thyme, parsley Serve with cranberry sauce  Lamb Curry powder, rosemary, garlic, thyme Serve with mint sauce or jelly  Veal Marjoram, basil Serve with current jelly, cranberry sauce  Beef Pepper, bay leaf Serve with dry mustard, unsalted chive butter  Fish [place] leaf, dill Serve with unsalted lemon  butter, unsalted parsley butter  Vegetables:    Asparagus Lemon juice   Broccoli Lemon juice   Carrots Mustard dressing parsley, mint, nutmeg, glazed with unsalted butter and sugar   Green beans Marjoram, lemon juice, nutmeg,dill seed   Tomatoes Basil, marjoram, onion   Spice /blend for [employer]" 4 tsp ground thyme 1 tsp ground sage 3 tsp ground rosemary 4 tsp ground marjoram   Test your knowledge 1. A product that says "Salt Free" may still contain sodium. True or False 2. Garlic Powder and Hot Pepper Sauce an be used as alternative seasonings.True or False 3. Processed foods have more sodium than fresh foods.  True or False 4. Canned Vegetables have less sodium than froze True or False  WAYS TO DECREASE YOUR SODIUM INTAKE 1. Avoid the use of added salt in cooking and at the table.  Table salt (and other prepared seasonings which contain salt) is probably one of the greatest sources of sodium in the diet.  Unsalted foods can gain flavor from the sweet, sour, and butter taste sensations of herbs and spices.  Instead of using salt for seasoning, try the following seasonings with the foods listed.  Remember: how you use them to enhance natural food flavors is limited only by your creativity... Allspice-Meat, fish, eggs, fruit, peas, red and yellow vegetables Almond Extract-Fruit baked goods Anise Seed-Sweet breads, fruit,  carrots, beets, cottage cheese, cookies (tastes like licorice) Basil-Meat, fish, eggs, vegetables, rice, vegetables salads, soups, sauces Bay Leaf-Meat, fish, stews, poultry Burnet-Salad, vegetables (cucumber-like flavor) Caraway Seed-Bread, cookies, cottage cheese, meat, vegetables, cheese, rice Cardamon-Baked goods, fruit, soups Celery Powder or seed-Salads, salad dressings, sauces, meatloaf, soup, bread.Do not use  celery salt Chervil-Meats, salads, fish, eggs, vegetables, cottage cheese (parsley-like flavor) Chili Power-Meatloaf, chicken cheese, corn, eggplant,  egg dishes Chives-Salads cottage cheese, egg dishes, soups, vegetables, sauces Cilantro-Salsa, casseroles Cinnamon-Baked goods, fruit, pork, lamb, chicken, carrots Cloves-Fruit, baked goods, fish, pot roast, green beans, beets, carrots Coriander-Pastry, cookies, meat, salads, cheese (lemon-orange flavor) Cumin-Meatloaf, fish,cheese, eggs, cabbage,fruit pie (caraway flavor) [employer], fruit, eggs, fish, poultry, cottage cheese, vegetables Dill Seed-Meat, cottage cheese, poultry, vegetables, fish, salads, bread Fennel Seed-Bread, cookies, apples, pork, eggs, fish, beets, cabbage, cheese, Licorice-like flavor Garlic-(buds or powder) Salads, meat, poultry, fish, bread, butter, vegetables, potatoes.Do not  use garlic salt Ginger-Fruit, vegetables, baked goods, meat, fish, poultry Horseradish Root-Meet, vegetables, butter Lemon Juice or Extract-Vegetables, fruit, tea, baked goods, fish salads Mace-Baked goods fruit, vegetables, fish, poultry (taste like nutmeg) Maple Extract-Syrups Marjoram-Meat, chicken, fish, vegetables, breads, green salads (taste like Sage) Mint-Tea, lamb, sherbet, vegetables, desserts, carrots, cabbage Mustard, Dry or Seed-Cheese, eggs, meats, vegetables, poultry Nutmeg-Baked goods, fruit, chicken, eggs, vegetables, desserts Onion Powder-Meat, fish, poultry, vegetables, cheese, eggs, bread, rice salads (Do not use   Onion salt) Orange Extract-Desserts, baked goods Oregano-Pasta, eggs, cheese, onions, pork, lamb, fish, chicken, vegetables, green salads Paprika-Meat, fish, poultry, eggs, cheese, vegetables Parsley Flakes-Butter, vegetables, meat fish, poultry, eggs, bread, salads (certain forms may   Contain sodium Pepper-Meat fish, poultry, vegetables, eggs Peppermint Extract-Desserts, baked goods Poppy Seed-Eggs, bread, cheese, fruit dressings, baked goods, noodles, vegetables, cottage  [employer], poultry, meat, fish,  cauliflower, turnips,eggs bread Saffron-Rice, bread, veal, chicken, fish, eggs Sage-Meat, fish, poultry, onions, eggplant, tomateos, pork, stews Savory-Eggs, salads, poultry, meat, rice, vegetables, soups, pork Tarragon-Meat, poultry, fish, eggs, butter, vegetables (licorice-like flavor)  Thyme-Meat, poultry, fish, eggs, vegetables, (clover-like flavor), sauces, soups Tumeric-Salads, butter, eggs, fish, rice, vegetables (saffron-like flavor) Vanilla Extract-Baked goods, candy Vinegar-Salads, vegetables, meat marinades Walnut Extract-baked goods, candy  2. Choose your Foods Wisely   The following is a list of foods to avoid which are high in sodium:  Meats-Avoid all smoked, canned, salt cured, dried and kosher meat and fish as well as Anchovies   [employer] meats:Bologna, Liverwurst, Pastrami Canned meat or fish  Marinated herring Caviar    Pepperoni Corned Beef   Pizza Dried chipped beef  Salami Frozen breaded fish or meat Salt pork Frankfurters or hot dogs  Sardines Gefilte fish   Sausage Ham (boiled ham, Proscuitto Smoked butt    spiced ham)   Spam      TV Dinners Vegetables Canned vegetables (Regular) Relish Canned mushrooms  Sauerkraut Olives    Tomato juice Pickles  Bakery and Dessert Products Canned puddings  Cream pies Cheesecake   Decorated cakes Cookies  Beverages/Juices Tomato juice, regular  Gatorade   V-8 vegetable juice, regular  Breads and Cereals Biscuit mixes   Salted potato chips, corn chips, pretzels Bread stuffing mixes  Salted crackers and rolls Pancake and waffle mixes Self-rising flour  Seasonings Accent    Meat sauces Barbecue sauce  Meat tenderizer Catsup    Monosodium glutamate (MSG) Celery salt   Onion salt Chili sauce   Prepared mustard Garlic salt   Salt, seasoned salt, sea salt Gravy mixes   Soy sauce  Horseradish   Steak sauce Ketchup   Tartar sauce Lite salt    Teriyaki sauce Marinade mixes   Worcestershire  sauce  Others Baking powder   Cocoa and cocoa mixes Baking soda   Commercial casserole mixes Candy-caramels, chocolate  Dehydrated soups    Bars, fudge,nougats  Instant rice and pasta mixes Canned broth or soup  Maraschino cherries Cheese, aged and processed cheese and cheese spreads  Learning Assessment Quiz  Indicated T (for True) or F (for False) for each of the following statements:  1. _____ Fresh fruits and vegetables and unprocessed grains are generally low in sodium 2. _____ Water may contain a considerable amount of sodium, depending on the source 3. _____ You can always tell if a food is high in sodium by tasting it 4. _____ Certain laxatives my be high in sodium and should be avoided unless prescribed   by a physician or pharmacist 5. _____ Salt substitutes may be used freely by anyone on a sodium restricted diet 6. _____ Sodium is present in table salt, food additives and as a natural component of   most foods 7. _____ Table salt is approximately 90% sodium 8. _____ Limiting sodium intake may help prevent excess fluid accumulation in the body 9. _____ On a sodium-restricted diet, seasonings such as bouillon soy sauce, and    cooking wine should be used in place of table salt 10. _____ On an ingredient list, a product which lists monosodium glutamate as the first   ingredient is an appropriate food to include on a low sodium diet  Circle the best answer(s) to the following statements (Hint: there may be more than one correct answer)  11. On a low-sodium diet, some acceptable snack items are:    A. Olives  F. Bean dip   K. Grapefruit juice    B. Salted Pretzels G. Commercial Popcorn   L. Canned peaches    C. Carrot Sticks  H. Bouillon   M. Unsalted nuts   D. [place] fries  I. Peanut butter crackers N. Salami   E. Sweet pickles J. Tomato Juice   O. Pizza  12.  Seasonings that may be used freely on a reduced - sodium diet include   A. Lemon wedges F.Monosodium  glutamate K. Celery seed    B.Soysauce   G. Pepper   L. Mustard powder   C. Sea salt  H. Cooking wine  M. Onion flakes   D. Vinegar  E. Prepared horseradish N. Salsa   E. Sage   J. Worcestershire sauce  O. Chutney

## 2018-12-07 NOTE — Progress Notes (Signed)
Cardiology Office Note    Date:  12/07/2018   ID:  Virginia [name], DOB 1936-03-27, MRN [id number]  PCP:  Lauree Chandler, NP  Cardiologist: Dorris Carnes, MD EPS: None  No chief complaint on file.   History of Present Illness:  Virginia [name] is a 83 y.o. female with chronic diastolic CHF, COPD Gold 4 on home O2, hypertension, HLD, status post left CEA, CKD stage III, dementia.  2D echo 2016 LVEF 65 to 70% with grade 1 DD.  Last saw Dr. Harrington Challenger 05/2018 and was having memory problems and being followed by palliative care.  Her oral intake was not good so Dr. Harrington Challenger told her to take Lasix as needed for weight gain.  Also hold by systolic if blood pressure less than 105.  TSH has been running little low 0.603 and 0.42-29/2019.  Creatinine 1.39-08/2018 went up to 1.77-08/2018.  BNP was 503 that day.  Patient comes in today accompanied by her brother who she lives with.  Dr. Lorrene Reid increased her Lasix to 40 mg daily 11/19/2018 for 2+ lower extremity edema and stopped her meloxicam.  Her swelling is down.  They do admit to her eating a fair amount of salt.  She has no other cardiac complaints.    Past Medical History:  Diagnosis Date  . Anxiety 07/08/2017  . Arthritis   . Carotid artery disease ([place])    a. s/p L CEA. b. followed by [facility].  . Carotid stenosis 10/19/2012  . Chronic diastolic CHF (congestive heart failure) ([place])   . CKD (chronic kidney disease), stage III ([place])   . Congestive heart disease ([place])   . COPD (chronic obstructive pulmonary disease) ([place])   . COPD GOLD IV 12/30/2014   PFTs 12/15/14 FEV1  0.57 (29%) ratio 54 p 17% resp to saba    . Cough 01/31/2016  . Dementia with behavioral disturbance ([place]) 07/08/2017  . Diverticulosis   . Dyspnea   . Essential hypertension 12/02/2014  . Fall at home Sept. 2013  . Gastroesophageal reflux disease 07/08/2017  . Hyperlipidemia   . Hypertension   . Hyponatremia 12/02/2014  . Hypotension 02/01/2017  . Internal hemorrhoid   . Memory disorder  10/24/2014  . Nausea and vomiting 11/30/2014  . Primary osteoarthritis involving multiple joints 07/08/2017  . Vitamin D deficiency 02/15/2015    Past Surgical History:  Procedure Laterality Date  . APPENDECTOMY    . BREAST REDUCTION SURGERY    . CAROTID ENDARTERECTOMY     left CEA  . CATARACT EXTRACTION Bilateral   . COLONOSCOPY  2015  . EYE SURGERY     cataracts removed, bilaterally   . skin cancer removal    . TONSILLECTOMY      Current Medications: Current Meds  Medication Sig  . albuterol (PROVENTIL) (2.5 MG/3ML) 0.083% nebulizer solution Take 6 mLs (5 mg total) by nebulization every 4 (four) hours as needed for wheezing or shortness of breath.  . ARTIFICIAL TEAR OP Place 2 drops into both eyes daily.   Marland Kitchen azelastine (ASTELIN) 0.1 % nasal spray Place 2 sprays into both nostrils 2 (two) times daily. Use in each nostril as directed  . Carbinoxamine Maleate 4 MG TABS Take 1 tablet (4 mg total) by mouth every 8 (eight) hours as needed.  . Cholecalciferol (D3 SUPER STRENGTH) 2000 units CAPS Take 1 capsule (2,000 Units total) by mouth daily.  . diclofenac sodium (VOLTAREN) 1 % GEL Apply 1 application topically 2 (two) times daily as needed (shoulder  and knee pain).  Marland Kitchen donepezil (ARICEPT) 10 MG tablet TAKE 1 TABLET BY MOUTH ONCE DAILY. Call to schedule f/u in November 2019  . fluticasone (FLONASE) 50 MCG/ACT nasal spray Place 2 sprays into both nostrils daily.  . fluticasone furoate-vilanterol (BREO ELLIPTA) 100-25 MCG/INH AEPB Inhale 1 puff into the lungs daily.  . furosemide (LASIX) 20 MG tablet Take 1 tablet (20 mg total) by mouth daily.  Marland Kitchen ipratropium (ATROVENT) 0.06 % nasal spray USE 2 SPRAYS IN EACH NOSTRIL TWICE DAILY  . ipratropium-albuterol (DUONEB) 0.5-2.5 (3) MG/3ML SOLN inhale contents of 1 vial in nebulizer every 6 hours  . memantine (NAMENDA) 10 MG tablet Take 1 tablet (10 mg total) by mouth 2 (two) times daily.  . Multiple Vitamins-Minerals (PRESERVISION AREDS 2) CAPS  Take 1 capsule by mouth 2 (two) times daily.   . nebivolol (BYSTOLIC) 5 MG tablet Take 0.5 tablets (2.5 mg total) by mouth daily. Hold for systolic BP less than 149.  If systolic BP over 702 give whole tablet  . ondansetron (ZOFRAN-ODT) 4 MG disintegrating tablet DISSOLVE 1 TABLET ON THE TONGUE IN THE MORNING  . pantoprazole (PROTONIX) 40 MG tablet TAKE 1 TABLET BY MOUTH ONCE DAILY  . [DISCONTINUED] furosemide (LASIX) 20 MG tablet Take 1 tablet (20 mg total) by mouth every other day. (Patient taking differently: Take 40 mg by mouth every other day. )     Allergies:   Lidocaine and Other   Social History   Socioeconomic History  . Marital status: Divorced    Spouse name: Not on file  . Number of children: 2  . Years of education: 7  . Highest education level: Not on file  Occupational History  . Occupation: retired  Scientific laboratory technician  . Financial resource strain: Not hard at all  . Food insecurity:    Worry: Never true    Inability: Never true  . Transportation needs:    Medical: No    Non-medical: No  Tobacco Use  . Smoking status: Current Some Day Smoker    Packs/day: 0.25    Years: 20.00    Pack years: 5.00    Types: Cigarettes    Last attempt to quit: 11/30/1994    Years since quitting: 24.0  . Smokeless tobacco: Never Used  . Tobacco comment: 4 or 5 cigarettes per day average  Substance and Sexual Activity  . Alcohol use: No    Alcohol/week: 0.0 standard drinks  . Drug use: No  . Sexual activity: Never    Birth control/protection: Post-menopausal  Lifestyle  . Physical activity:    Days per week: 1 day    Minutes per session: 40 min  . Stress: Only a little  Relationships  . Social connections:    Talks on phone: More than three times a week    Gets together: More than three times a week    Attends religious service: Never    Active member of club or organization: Yes    Attends meetings of clubs or organizations: 1 to 4 times per year    Relationship status:  Divorced  Other Topics Concern  . Not on file  Social History Narrative   04/05/18 lives with son and brother, [name]   Diet:       Do you drink/eat things with caffeine: yes      Marital Status: Divorced   What Year Married:1959      Do you live in a house, apartment, Assisted Living, Condo, trailer? House  Is it one or more stories? 2 stories      How many persons live in your home? Just Patient      Do you have any pets in your home? 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 Very Little   Type and how often? Walk      Do you have a living will? None   DNR?   Discuss one? No      Do you have signed POA/HPOA forms?      Patient drinks 1 cup of caffeine daily.   Patient is right handed.     Family History:  The patient's family history includes Cancer (age of onset: 62) in her mother; Cancer (age of onset: 41) in her maternal aunt; Dementia in her brother; Diabetes in her maternal aunt and maternal grandmother; Heart attack in her father; Heart disease in her father; Heart failure in her maternal grandmother; Hypertension in her father.   ROS:   Please see the history of present illness.    Review of Systems  Constitution: Negative.  HENT: Negative.   Eyes: Negative.   Cardiovascular: Negative.   Respiratory: Negative.   Hematologic/Lymphatic: Negative.   Musculoskeletal: Negative.  Negative for joint pain.  Gastrointestinal: Negative.   Genitourinary: Negative.   Neurological: Negative.   Psychiatric/Behavioral: Positive for memory loss.   All other systems reviewed and are negative.   PHYSICAL EXAM:   VS:  BP 118/66   Pulse 84   Ht 5\' 2"  (1.575 m)   Wt 187 lb 1.9 oz (84.9 kg)   SpO2 95%   BMI 34.22 kg/m   Physical Exam  GEN: Obese, in no acute distress  Neck: no JVD, carotid bruits, or masses Cardiac:RRR; no murmurs, rubs, or gallops  Respiratory:  clear to auscultation bilaterally, normal work of breathing GI: soft,  nontender, nondistended, + BS Ext: without cyanosis, clubbing, or edema, Good distal pulses bilaterally Neuro:  Alert and Oriented x 3, Strength and sensation are intact Psych: euthymic mood, full affect  Wt Readings from Last 3 Encounters:  12/07/18 187 lb 1.9 oz (84.9 kg)  11/19/18 193 lb 9.6 oz (87.8 kg)  09/01/18 195 lb (88.5 kg)      Studies/Labs Reviewed:   EKG:  EKG is ordered today.  The ekg ordered today demonstrates normal sinus rhythm poor R wave progression anteriorly, no acute change  Recent Labs: 08/30/2018: ALT 13; Hemoglobin 13.9; Platelets 276; TSH 0.422 09/27/2018: BUN 24; Creatinine, Ser 1.77; NT-Pro BNP 503; Potassium 4.4; Sodium 144   Lipid Panel    Component Value Date/Time   CHOL 189 06/21/2018 0000   TRIG 140 06/21/2018 0000   HDL 58 06/21/2018 0000   CHOLHDL 3.3 06/21/2018 0000   CHOLHDL 2.9 12/10/2017 1349   VLDL 23.8 08/01/2015 0857   LDLCALC 103 (H) 06/21/2018 0000   LDLCALC 111 (H) 12/10/2017 1349   LDLDIRECT 142.0 05/24/2015 1108    Additional studies/ records that were reviewed today include:  2D echo 1/7/2016Study Conclusions  - Left ventricle: The cavity size was normal. Systolic function was   vigorous. The estimated ejection fraction was in the range of 65%   to 70%. Wall motion was normal; there were no regional wall   motion abnormalities. Doppler parameters are consistent with   abnormal left ventricular relaxation (grade 1 diastolic   dysfunction). - Mitral valve: Calcified annulus.     ASSESSMENT:    1.  Chronic diastolic CHF (congestive heart failure) ([place])   2. Essential hypertension   3. CKD (chronic kidney disease), stage III ([place])   4. COPD GOLD IV   5. Memory disorder      PLAN:  In order of problems listed above:  Chronic diastolic CHF LVEF 65 to 87% with grade 1 DD.  Lasix recently increased by Dr. Lorrene Reid because of 2+ edema.  She has lost 5 pounds and no longer has edema.  Will decrease it to 20 mg once daily.   2 g sodium diet.  Can take an extra 20 mg of Lasix as needed for 2 pound weight gain or edema.  Hypertension stable  CKD stage III check renal function today followed by Dr. Lorrene Reid.  COPD Gold IV   Dementia with memory loss-lives with brother and son  Medication Adjustments/Labs and Tests Ordered: Current medicines are reviewed at length with the patient today.  Concerns regarding medicines are outlined above.  Medication changes, Labs and Tests ordered today are listed in the Patient Instructions below. Patient Instructions   Medication Instructions:  Your physician has recommended you make the following change in your medication:   DECREASE: furosemide (lasix) to 20 mg once a day  You may take an extra 20 mg if you have weight gain (>3 lbs in 24 hours, >5 lbs in 1 week) or increased swelling or shortness of breath  If you need a refill on your cardiac medications before your next appointment, please call your pharmacy.   Lab work: None ordered If you have labs (blood work) drawn today and your tests are completely normal, you will receive your results only by: Marland Kitchen MyChart Message (if you have MyChart) OR . A paper copy in the mail If you have any lab test that is abnormal or we need to change your treatment, we will call you to review the results.  Testing/Procedures: None ordered  Follow-Up: At [facility], you and your health needs are our priority.  As part of our continuing mission to provide you with exceptional heart care, we have created designated Provider Care Teams.  These Care Teams include your primary Cardiologist (physician) and Advanced Practice Providers (APPs -  Physician Assistants and Nurse Practitioners) who all work together to provide you with the care you need, when you need it. You will need a follow up appointment in:  6 months.  Please call our office 2 months in advance to schedule this appointment.  You may see Dorris Carnes, MD or one of the following  Advanced Practice Providers on your designated Care Team: Richardson Dopp, PA-C [place], [place] . Daune Perch, NP  Any Other Special Instructions Will Be Listed Below (If Applicable). Two Gram Sodium Diet 2000 mg  What is Sodium? Sodium is a mineral found naturally in many foods. The most significant source of sodium in the diet is table salt, which is about 40% sodium.  Processed, convenience, and preserved foods also contain a large amount of sodium.  The body needs only 500 mg of sodium daily to function,  A normal diet provides more than enough sodium even if you do not use salt.  Why Limit Sodium? A build up of sodium in the body can cause thirst, increased blood pressure, shortness of breath, and water retention.  Decreasing sodium in the diet can reduce edema and risk of heart attack or stroke associated with high blood pressure.  Keep in mind that there are many other factors involved in these  health problems.  Heredity, obesity, lack of exercise, cigarette smoking, stress and what you eat all play a role.  General Guidelines:  Do not add salt at the table or in cooking.  One teaspoon of salt contains over 2 grams of sodium.  Read food labels  Avoid processed and convenience foods  Ask your dietitian before eating any foods not dicussed in the menu planning guidelines  Consult your physician if you wish to use a salt substitute or a sodium containing medication such as antacids.  Limit milk and milk products to 16 oz (2 cups) per day.  Shopping Hints:  READ LABELS!! "Dietetic" does not necessarily mean low sodium.  Salt and other sodium ingredients are often added to foods during processing.   Menu Planning Guidelines Food Group Choose More Often Avoid  Beverages (see also the milk group All fruit juices, low-sodium, salt-free vegetables juices, low-sodium carbonated beverages Regular vegetable or tomato juices, commercially softened water used for drinking or cooking    Breads and Cereals Enriched white, wheat, rye and pumpernickel bread, hard rolls and dinner rolls; muffins, cornbread and waffles; most dry cereals, cooked cereal without added salt; unsalted crackers and breadsticks; low sodium or homemade bread crumbs Bread, rolls and crackers with salted tops; quick breads; instant hot cereals; pancakes; commercial bread stuffing; self-rising flower and biscuit mixes; regular bread crumbs or cracker crumbs  Desserts and Sweets Desserts and sweets mad with mild should be within allowance Instant pudding mixes and cake mixes  Fats Butter or margarine; vegetable oils; unsalted salad dressings, regular salad dressings limited to 1 Tbs; light, sour and heavy cream Regular salad dressings containing bacon fat, bacon bits, and salt pork; snack dips made with instant soup mixes or processed cheese; salted nuts  Fruits Most fresh, frozen and canned fruits Fruits processed with salt or sodium-containing ingredient (some dried fruits are processed with sodium sulfites        Vegetables Fresh, frozen vegetables and low- sodium canned vegetables Regular canned vegetables, sauerkraut, pickled vegetables, and others prepared in brine; frozen vegetables in sauces; vegetables seasoned with ham, bacon or salt pork  Condiments, Sauces, Miscellaneous  Salt substitute with physician's approval; pepper, herbs, spices; vinegar, lemon or lime juice; hot pepper sauce; garlic powder, onion powder, low sodium soy sauce (1 Tbs.); low sodium condiments (ketchup, chili sauce, mustard) in limited amounts (1 tsp.) fresh ground horseradish; unsalted tortilla chips, pretzels, potato chips, popcorn, salsa (1/4 cup) Any seasoning made with salt including garlic salt, celery salt, onion salt, and seasoned salt; sea salt, rock salt, kosher salt; meat tenderizers; monosodium glutamate; mustard, regular soy sauce, barbecue, sauce, chili sauce, teriyaki sauce, steak sauce, Worcestershire sauce, and most  flavored vinegars; canned gravy and mixes; regular condiments; salted snack foods, olives, picles, relish, horseradish sauce, catsup   Food preparation: Try these seasonings Meats:    Pork Sage, onion Serve with applesauce  Chicken Poultry seasoning, thyme, parsley Serve with cranberry sauce  Lamb Curry powder, rosemary, garlic, thyme Serve with mint sauce or jelly  Veal Marjoram, basil Serve with current jelly, cranberry sauce  Beef Pepper, bay leaf Serve with dry mustard, unsalted chive butter  Fish [place] leaf, dill Serve with unsalted lemon butter, unsalted parsley butter  Vegetables:    Asparagus Lemon juice   Broccoli Lemon juice   Carrots Mustard dressing parsley, mint, nutmeg, glazed with unsalted butter and sugar   Green beans Marjoram, lemon juice, nutmeg,dill seed   Tomatoes Basil, marjoram, onion   Spice /blend for "  Salt Shaker" 4 tsp ground thyme 1 tsp ground sage 3 tsp ground rosemary 4 tsp ground marjoram   Test your knowledge 1. A product that says "Salt Free" may still contain sodium. True or False 2. Garlic Powder and Hot Pepper Sauce an be used as alternative seasonings.True or False 3. Processed foods have more sodium than fresh foods.  True or False 4. Canned Vegetables have less sodium than froze True or False  WAYS TO DECREASE YOUR SODIUM INTAKE 1. Avoid the use of added salt in cooking and at the table.  Table salt (and other prepared seasonings which contain salt) is probably one of the greatest sources of sodium in the diet.  Unsalted foods can gain flavor from the sweet, sour, and butter taste sensations of herbs and spices.  Instead of using salt for seasoning, try the following seasonings with the foods listed.  Remember: how you use them to enhance natural food flavors is limited only by your creativity... Allspice-Meat, fish, eggs, fruit, peas, red and yellow vegetables Almond Extract-Fruit baked goods Anise Seed-Sweet breads, fruit, carrots, beets, cottage  cheese, cookies (tastes like licorice) Basil-Meat, fish, eggs, vegetables, rice, vegetables salads, soups, sauces Bay Leaf-Meat, fish, stews, poultry Burnet-Salad, vegetables (cucumber-like flavor) Caraway Seed-Bread, cookies, cottage cheese, meat, vegetables, cheese, rice Cardamon-Baked goods, fruit, soups Celery Powder or seed-Salads, salad dressings, sauces, meatloaf, soup, bread.Do not use  celery salt Chervil-Meats, salads, fish, eggs, vegetables, cottage cheese (parsley-like flavor) Chili Power-Meatloaf, chicken cheese, corn, eggplant, egg dishes Chives-Salads cottage cheese, egg dishes, soups, vegetables, sauces Cilantro-Salsa, casseroles Cinnamon-Baked goods, fruit, pork, lamb, chicken, carrots Cloves-Fruit, baked goods, fish, pot roast, green beans, beets, carrots Coriander-Pastry, cookies, meat, salads, cheese (lemon-orange flavor) Cumin-Meatloaf, fish,cheese, eggs, cabbage,fruit pie (caraway flavor) [employer], fruit, eggs, fish, poultry, cottage cheese, vegetables Dill Seed-Meat, cottage cheese, poultry, vegetables, fish, salads, bread Fennel Seed-Bread, cookies, apples, pork, eggs, fish, beets, cabbage, cheese, Licorice-like flavor Garlic-(buds or powder) Salads, meat, poultry, fish, bread, butter, vegetables, potatoes.Do not  use garlic salt Ginger-Fruit, vegetables, baked goods, meat, fish, poultry Horseradish Root-Meet, vegetables, butter Lemon Juice or Extract-Vegetables, fruit, tea, baked goods, fish salads Mace-Baked goods fruit, vegetables, fish, poultry (taste like nutmeg) Maple Extract-Syrups Marjoram-Meat, chicken, fish, vegetables, breads, green salads (taste like Sage) Mint-Tea, lamb, sherbet, vegetables, desserts, carrots, cabbage Mustard, Dry or Seed-Cheese, eggs, meats, vegetables, poultry Nutmeg-Baked goods, fruit, chicken, eggs, vegetables, desserts Onion Powder-Meat, fish, poultry, vegetables, cheese, eggs, bread, rice salads (Do not use   Onion  salt) Orange Extract-Desserts, baked goods Oregano-Pasta, eggs, cheese, onions, pork, lamb, fish, chicken, vegetables, green salads Paprika-Meat, fish, poultry, eggs, cheese, vegetables Parsley Flakes-Butter, vegetables, meat fish, poultry, eggs, bread, salads (certain forms may   Contain sodium Pepper-Meat fish, poultry, vegetables, eggs Peppermint Extract-Desserts, baked goods Poppy Seed-Eggs, bread, cheese, fruit dressings, baked goods, noodles, vegetables, cottage  [employer], poultry, meat, fish, cauliflower, turnips,eggs bread Saffron-Rice, bread, veal, chicken, fish, eggs Sage-Meat, fish, poultry, onions, eggplant, tomateos, pork, stews Savory-Eggs, salads, poultry, meat, rice, vegetables, soups, pork Tarragon-Meat, poultry, fish, eggs, butter, vegetables (licorice-like flavor)  Thyme-Meat, poultry, fish, eggs, vegetables, (clover-like flavor), sauces, soups Tumeric-Salads, butter, eggs, fish, rice, vegetables (saffron-like flavor) Vanilla Extract-Baked goods, candy Vinegar-Salads, vegetables, meat marinades Walnut Extract-baked goods, candy  2. Choose your Foods Wisely   The following is a list of foods to avoid which are high in sodium:  Meats-Avoid all smoked, canned, salt cured, dried and kosher meat and fish as well as Anchovies   Lox Berniece Salines  Luncheon meats:Bologna, Liverwurst, Pastrami Canned meat or fish  Marinated herring Caviar    Pepperoni Corned Beef   Pizza Dried chipped beef  Salami Frozen breaded fish or meat Salt pork Frankfurters or hot dogs  Sardines Gefilte fish   Sausage Ham (boiled ham, Proscuitto Smoked butt    spiced ham)   Spam      TV Dinners Vegetables Canned vegetables (Regular) Relish Canned mushrooms  Sauerkraut Olives    Tomato juice Pickles  Bakery and Dessert Products Canned puddings  Cream pies Cheesecake   Decorated cakes Cookies  Beverages/Juices Tomato juice, regular  Gatorade   V-8  vegetable juice, regular  Breads and Cereals Biscuit mixes   Salted potato chips, corn chips, pretzels Bread stuffing mixes  Salted crackers and rolls Pancake and waffle mixes Self-rising flour  Seasonings Accent    Meat sauces Barbecue sauce  Meat tenderizer Catsup    Monosodium glutamate (MSG) Celery salt   Onion salt Chili sauce   Prepared mustard Garlic salt   Salt, seasoned salt, sea salt Gravy mixes   Soy sauce Horseradish   Steak sauce Ketchup   Tartar sauce Lite salt    Teriyaki sauce Marinade mixes   Worcestershire sauce  Others Baking powder   Cocoa and cocoa mixes Baking soda   Commercial casserole mixes Candy-caramels, chocolate  Dehydrated soups    Bars, fudge,nougats  Instant rice and pasta mixes Canned broth or soup  Maraschino cherries Cheese, aged and processed cheese and cheese spreads  Learning Assessment Quiz  Indicated T (for True) or F (for False) for each of the following statements:  1. _____ Fresh fruits and vegetables and unprocessed grains are generally low in sodium 2. _____ Water may contain a considerable amount of sodium, depending on the source 3. _____ You can always tell if a food is high in sodium by tasting it 4. _____ Certain laxatives my be high in sodium and should be avoided unless prescribed   by a physician or pharmacist 5. _____ Salt substitutes may be used freely by anyone on a sodium restricted diet 6. _____ Sodium is present in table salt, food additives and as a natural component of   most foods 7. _____ Table salt is approximately 90% sodium 8. _____ Limiting sodium intake may help prevent excess fluid accumulation in the body 9. _____ On a sodium-restricted diet, seasonings such as bouillon soy sauce, and    cooking wine should be used in place of table salt 10. _____ On an ingredient list, a product which lists monosodium glutamate as the first   ingredient is an appropriate food to include on a low sodium diet  Circle the  best answer(s) to the following statements (Hint: there may be more than one correct answer)  11. On a low-sodium diet, some acceptable snack items are:    A. Olives  F. Bean dip   K. Grapefruit juice    B. Salted Pretzels G. Commercial Popcorn   L. Canned peaches    C. Carrot Sticks  H. Bouillon   M. Unsalted nuts   D. [place] fries  I. Peanut butter crackers N. Salami   E. Sweet pickles J. Tomato Juice   O. Pizza  12.  Seasonings that may be used freely on a reduced - sodium diet include   A. Lemon wedges F.Monosodium glutamate K. Celery seed    B.Soysauce   G. Pepper   L. Mustard powder   C. Sea salt  H. Cooking wine  M. Onion flakes   D. Vinegar  E. Prepared horseradish N. Salsa   E. Sage   J. Worcestershire sauce  O. Chutney        Signed, Ermalinda Barrios, PA-C  12/07/2018 2:40 PM    [facility] [place], [place], [place]  [address] Phone: [phone]; Fax: [phone]

## 2018-12-08 ENCOUNTER — Ambulatory Visit: Payer: Medicare Other | Admitting: Physician Assistant

## 2018-12-08 ENCOUNTER — Other Ambulatory Visit: Payer: Self-pay | Admitting: Nurse Practitioner

## 2018-12-08 ENCOUNTER — Other Ambulatory Visit: Payer: Self-pay | Admitting: Allergy and Immunology

## 2018-12-08 LAB — BASIC METABOLIC PANEL
BUN/Creatinine Ratio: 12 (ref 12–28)
BUN: 15 mg/dL (ref 8–27)
CO2: 26 mmol/L (ref 20–29)
Calcium: 9.2 mg/dL (ref 8.7–10.3)
Chloride: 101 mmol/L (ref 96–106)
Creatinine, Ser: 1.26 mg/dL — ABNORMAL HIGH (ref 0.57–1.00)
GFR calc Af Amer: 46 mL/min/{1.73_m2} — ABNORMAL LOW (ref >59)
GFR calc non Af Amer: 40 mL/min/{1.73_m2} — ABNORMAL LOW (ref >59)
Glucose: 86 mg/dL (ref 65–99)
Potassium: 3.9 mmol/L (ref 3.5–5.2)
Sodium: 144 mmol/L (ref 134–144)

## 2018-12-14 ENCOUNTER — Ambulatory Visit (INDEPENDENT_AMBULATORY_CARE_PROVIDER_SITE_OTHER): Payer: Medicare Other | Admitting: Nurse Practitioner

## 2018-12-14 ENCOUNTER — Encounter: Payer: Self-pay | Admitting: Nurse Practitioner

## 2018-12-14 VITALS — BP 128/80 | HR 83 | Temp 98.6°F | Ht 62.0 in | Wt 190.0 lb

## 2018-12-14 DIAGNOSIS — J449 Chronic obstructive pulmonary disease, unspecified: Secondary | ICD-10-CM | POA: Diagnosis not present

## 2018-12-14 DIAGNOSIS — M150 Primary generalized (osteo)arthritis: Secondary | ICD-10-CM | POA: Diagnosis not present

## 2018-12-14 DIAGNOSIS — K219 Gastro-esophageal reflux disease without esophagitis: Secondary | ICD-10-CM

## 2018-12-14 DIAGNOSIS — F0391 Unspecified dementia with behavioral disturbance: Secondary | ICD-10-CM | POA: Diagnosis not present

## 2018-12-14 DIAGNOSIS — N183 Chronic kidney disease, stage 3 unspecified: Secondary | ICD-10-CM

## 2018-12-14 DIAGNOSIS — I5032 Chronic diastolic (congestive) heart failure: Secondary | ICD-10-CM

## 2018-12-14 DIAGNOSIS — F17200 Nicotine dependence, unspecified, uncomplicated: Secondary | ICD-10-CM | POA: Diagnosis not present

## 2018-12-14 DIAGNOSIS — R7989 Other specified abnormal findings of blood chemistry: Secondary | ICD-10-CM

## 2018-12-14 DIAGNOSIS — M159 Polyosteoarthritis, unspecified: Secondary | ICD-10-CM

## 2018-12-14 MED ORDER — FAMOTIDINE 20 MG PO TABS: 20.0000 mg | ORAL_TABLET | Freq: Two times a day (BID) | ORAL | Status: DC

## 2018-12-14 NOTE — Progress Notes (Signed)
Careteam: Patient Care Team: Lauree Chandler, NP as PCP - General (Nurse Practitioner) Fay Records, MD as PCP - Cardiology (Cardiology) Tanda Rockers, MD as Consulting Physician (Pulmonary Disease) Conan Bowens, RN as Registered Nurse [facility] and Palliative Medicine) Hilarie Fredrickson, Lajuan Lines, MD as Consulting Physician (Gastroenterology)  Advanced Directive information Does Patient Have a Medical Advance Directive?: Yes, Type of Advance Directive: [place], Does patient want to make changes to medical advance directive?: No - Patient declined  Allergies  Allergen Reactions  . Lidocaine Anaphylaxis, Swelling, Rash and Other (See Comments)    Any of the " [facility] "  . Other Other (See Comments)    All drugs that end in "cane'-  novacane etc. Daughter states patient almost died when she had it when she was born    Chief Complaint  Patient presents with  . Medical Management of Chronic Issues    33mth follow-up     HPI: Patient is a 83 y.o. female seen in the office today for routine follow up.   TSH- marginally low on 9/30, follow up TSH scheduled in 3 month but she never got this done. Does not wish to get blood drawn at our office.    Vit D def- taking 2000 units daily, Vit d 39.9   HTN- blood pressure at goal. Denies dizziness. Blood pressure remains good at home. No low BP, occasional high. bystolic 5 mg taking 1/2 tablet daily except if sbp over 150 will get whole tablet.    CKD-Cr slightly worse on last labs and lasix was adjusted.    OA- pain in multiple joints- had cortisone injections in the past, which has significantly helped. Overdue for these, planning to go this week. No longer on mobic.    GERD/nausea - has made diet modifications but still having issues with Nausea and GERD daily. Has appt with Dr Hilarie Fredrickson.  Taking pepcid twice daily. Has been using zofran as well.   LE edema- improved, keeping feet up. Currently taking lasix 20 mg  daily per cardiology    COPD- quit cigarettes for years then in the last few months started smoking again. 4-6 cigarettes daily had stopped for years but recently restarted. Now smoking 2-3 cigarette daily. Continues to use breo daily.    Memory loss- ongoing declined. Brother helps her. She lives with her brother and son. continues on aricept and namenda.  Review of Systems:  Review of Systems  Unable to perform ROS: Dementia    Past Medical History:  Diagnosis Date  . Anxiety 07/08/2017  . Arthritis   . Carotid artery disease ([place])    a. s/p L CEA. b. followed by [facility].  . Carotid stenosis 10/19/2012  . Chronic diastolic CHF (congestive heart failure) ([place])   . CKD (chronic kidney disease), stage III ([place])   . Congestive heart disease ([place])   . COPD (chronic obstructive pulmonary disease) ([place])   . COPD GOLD IV 12/30/2014   PFTs 12/15/14 FEV1  0.57 (29%) ratio 54 p 17% resp to saba    . Cough 01/31/2016  . Dementia with behavioral disturbance ([place]) 07/08/2017  . Diverticulosis   . Dyspnea   . Essential hypertension 12/02/2014  . Fall at home Sept. 2013  . Gastroesophageal reflux disease 07/08/2017  . Hyperlipidemia   . Hypertension   . Hyponatremia 12/02/2014  . Hypotension 02/01/2017  . Internal hemorrhoid   . Memory disorder 10/24/2014  . Nausea and vomiting 11/30/2014  .  Primary osteoarthritis involving multiple joints 07/08/2017  . Vitamin D deficiency 02/15/2015   Past Surgical History:  Procedure Laterality Date  . APPENDECTOMY    . BREAST REDUCTION SURGERY    . CAROTID ENDARTERECTOMY     left CEA  . CATARACT EXTRACTION Bilateral   . COLONOSCOPY  2015  . EYE SURGERY     cataracts removed, bilaterally   . skin cancer removal    . TONSILLECTOMY     Social History:   reports that she has been smoking cigarettes. She has a 5.00 pack-year smoking history. She has never used smokeless tobacco. She reports that she does not drink alcohol or use drugs.  Family History  Problem  Relation Age of Onset  . Heart disease Father        Before age 55  . Hypertension Father   . Heart attack Father   . Cancer Mother 40       Brain  . Dementia Brother   . Cancer Maternal Aunt 61       breast cancer  . Diabetes Maternal Aunt   . Heart failure Maternal Grandmother   . Diabetes Maternal Grandmother   . Stroke Neg Hx     Medications: Patient's Medications  New Prescriptions   No medications on file  Previous Medications   ALBUTEROL (PROVENTIL) (2.5 MG/3ML) 0.083% NEBULIZER SOLUTION    Take 6 mLs (5 mg total) by nebulization every 4 (four) hours as needed for wheezing or shortness of breath.   ARTIFICIAL TEAR OP    Place 2 drops into both eyes daily.    CARBINOXAMINE MALEATE 4 MG TABS    Take 1 tablet (4 mg total) by mouth every 8 (eight) hours as needed.   CHOLECALCIFEROL (D3 SUPER STRENGTH) 2000 UNITS CAPS    Take 1 capsule (2,000 Units total) by mouth daily.   DICLOFENAC SODIUM (VOLTAREN) 1 % GEL    Apply 1 application topically 2 (two) times daily as needed (shoulder and knee pain).   DONEPEZIL (ARICEPT) 10 MG TABLET    TAKE 1 TABLET BY MOUTH ONCE DAILY. Call to schedule f/u in November 2019   FLUTICASONE (FLONASE) 50 MCG/ACT NASAL SPRAY    USE 2 SPRAYS IN EACH NOSTRIL DAILY   FLUTICASONE FUROATE-VILANTEROL (BREO ELLIPTA) 100-25 MCG/INH AEPB    Inhale 1 puff into the lungs daily.   FUROSEMIDE (LASIX) 20 MG TABLET    Take 1 tablet (20 mg total) by mouth daily.   IPRATROPIUM (ATROVENT) 0.06 % NASAL SPRAY    USE 2 SPRAYS IN EACH NOSTRIL TWICE DAILY   IPRATROPIUM-ALBUTEROL (DUONEB) 0.5-2.5 (3) MG/3ML SOLN    inhale contents of 1 vial in nebulizer every 6 hours   MEMANTINE (NAMENDA) 10 MG TABLET    Take 1 tablet (10 mg total) by mouth 2 (two) times daily.   NEBIVOLOL (BYSTOLIC) 5 MG TABLET    Take 0.5 tablets (2.5 mg total) by mouth daily. Hold for systolic BP less than 063.  If systolic BP over 016 give whole tablet   ONDANSETRON (ZOFRAN-ODT) 4 MG DISINTEGRATING TABLET     DISSOLVE 1 TABLET ON THE TONGUE IN THE MORNING   PANTOPRAZOLE (PROTONIX) 40 MG TABLET    TAKE 1 TABLET BY MOUTH ONCE DAILY  Modified Medications   No medications on file  Discontinued Medications   AZELASTINE (ASTELIN) 0.1 % NASAL SPRAY    Place 2 sprays into both nostrils 2 (two) times daily. Use in each nostril as directed   MULTIPLE  VITAMINS-MINERALS (PRESERVISION AREDS 2) CAPS    Take 1 capsule by mouth 2 (two) times daily.      Physical Exam:  Vitals:   12/14/18 1351  BP: 128/80  Pulse: 83  Temp: 98.6 F (37 C)  TempSrc: Oral  SpO2: 96%  Weight: 190 lb (86.2 kg)  Height: 5\' 2"  (1.575 m)   Body mass index is 34.75 kg/m.  Physical Exam Constitutional:      General: She is not in acute distress.    Appearance: She is well-developed.  HENT:     Head: Normocephalic and atraumatic.  Eyes:     Pupils: Pupils are equal, round, and reactive to light.     Comments: glasses  Neck:     Musculoskeletal: Normal range of motion.  Cardiovascular:     Rate and Rhythm: Normal rate and regular rhythm.     Heart sounds: Normal heart sounds.  Pulmonary:     Effort: Pulmonary effort is normal. No respiratory distress.     Breath sounds: Normal breath sounds.  Abdominal:     General: Bowel sounds are normal. There is no distension.     Palpations: Abdomen is soft.  Musculoskeletal:     Right lower leg: No edema.     Left lower leg: No edema.  Skin:    General: Skin is warm and dry.     Capillary Refill: Capillary refill takes less than 2 seconds.  Neurological:     Mental Status: She is alert.     Comments: Oriented to person  Psychiatric:     Comments: Flat affect     Labs reviewed: Basic Metabolic Panel: Recent Labs    06/21/18 0000 08/30/18 1423 09/27/18 1446 12/07/18 1347  NA 142 141 144 144  K 4.1 4.8 4.4 3.9  CL 104 99 100 101  CO2 24 27 29 26   GLUCOSE 97 131* 93 86  BUN 17 20 24 15   CREATININE 1.16* 1.39* 1.77* 1.26*  CALCIUM 8.6* 8.7 9.1 9.2  TSH  0.603 0.422*  --   --    Liver Function Tests: Recent Labs    01/08/18 1150 06/21/18 0000 08/30/18 1423  AST 19 20 19   ALT 17 15 13   ALKPHOS 72 56 67  BILITOT 0.8 1.2 0.8  PROT 5.9* 5.5* 5.8*  ALBUMIN 3.6 3.3* 3.8   No results for input(s): LIPASE, AMYLASE in the last 8760 hours. No results for input(s): AMMONIA in the last 8760 hours. CBC: Recent Labs    01/08/18 1150 06/21/18 0000 08/30/18 1423  WBC 9.9 8.1 7.4  NEUTROABS 6.3  --   --   HGB 13.7 14.0 13.9  HCT 42.7 43.8 43.4  MCV 88 92 89  PLT  --  205 276   Lipid Panel: Recent Labs    06/21/18 0000  CHOL 189  HDL 58  LDLCALC 103*  TRIG 140  CHOLHDL 3.3   TSH: Recent Labs    06/21/18 0000 08/30/18 1423  TSH 0.603 0.422*   A1C: Lab Results  Component Value Date   HGBA1C 5.2 12/10/2017     Assessment/Plan 1. Gastroesophageal reflux disease without esophagitis GERD vs nausea. She has follow up scheduled with GI for further evaluation. Encouraged dietary modifications. Continues on pepcid  2. CKD (chronic kidney disease), stage III (HCC) -stable, encouraged proper hydration with avoiding NSAIDs.   3. Dementia with behavioral disturbance, unspecified dementia type ([place]) Progressive memory loss. No acute decline noted. Continues on aricept and namenda.   4.  COPD GOLD IV Stable at this time. Has resumed smoking. Encouraged cessation to prevent worsening of symptoms. Continues on Breo and Duoneb PRN.   5. Primary osteoarthritis involving multiple joints Worsening OA in knees, following with ortho for knee injections which are beneficial but she is overdue and therefore pain has worsened. Plans to follow up to get these done again.   6. Smoker -encouraged cessation.   7. Chronic diastolic heart failure (HCC) Stable, continues on lasix 20 mg daily   8. Low TSH level To follow up TSH at cardiologist which is where she prefers to go for labs.    Next appt: 6 month follow up, sooner if needed    Kacyn Souder K. [place], [facility] [phone]

## 2018-12-14 NOTE — Patient Instructions (Addendum)
To obtain TSH   To make follow up with Gastroenterology due to ongoing nausea  Follow up in 6 months for routine follow up.

## 2018-12-15 ENCOUNTER — Telehealth: Payer: Self-pay

## 2018-12-15 ENCOUNTER — Telehealth: Payer: Self-pay | Admitting: Internal Medicine

## 2018-12-15 NOTE — Telephone Encounter (Signed)
Spoke with brother [name] in earlier telephone encounter dated 12/15/2018 at 10:58 am.   Also spoke with brother [name] again to let him know that he needed to schedule labs to be done with Dr. Rutherford Guys. He verbalized understanding and denied further questions.

## 2018-12-15 NOTE — Telephone Encounter (Signed)
Okay thank you. He will need to call and schedule the lab to be done there.

## 2018-12-15 NOTE — Telephone Encounter (Signed)
New Message   Pts brother is calling and said the [facility] is requesting the pt have new labs drawn  They would like the labs drawn with Lanny Hurst Please call

## 2018-12-15 NOTE — Telephone Encounter (Signed)
The patient needs TSH drawn as follow up from 08/31/19 per Dr. Harrington Challenger. The order is in [facility]. Will send message to scheduling to contact patient's brother, Hughes Better and make appointment for lab (TSH)

## 2018-12-15 NOTE — Telephone Encounter (Signed)
Patient's son called to update patients medication of pepcid. Son stated that patient just had lab work drawn by heart care provider a couple weeks ago but TSH was not done. Son said he is requesting it to be drawn at Dr. Irean Hong office.

## 2018-12-16 DIAGNOSIS — M19019 Primary osteoarthritis, unspecified shoulder: Secondary | ICD-10-CM | POA: Diagnosis not present

## 2018-12-16 DIAGNOSIS — M1711 Unilateral primary osteoarthritis, right knee: Secondary | ICD-10-CM | POA: Diagnosis not present

## 2018-12-16 DIAGNOSIS — M19011 Primary osteoarthritis, right shoulder: Secondary | ICD-10-CM | POA: Diagnosis not present

## 2018-12-20 ENCOUNTER — Ambulatory Visit: Payer: Medicare Other | Admitting: Physician Assistant

## 2018-12-21 ENCOUNTER — Other Ambulatory Visit: Payer: Medicare Other | Admitting: *Deleted

## 2018-12-21 DIAGNOSIS — Z515 Encounter for palliative care: Secondary | ICD-10-CM

## 2018-12-22 ENCOUNTER — Other Ambulatory Visit: Payer: Medicare Other

## 2018-12-24 NOTE — Progress Notes (Signed)
COMMUNITY PALLIATIVE CARE RN NOTE  PATIENT NAME: TAGAN BARTRAM DOB: 11-Jun-1936 MRN: [id number]  PRIMARY CARE PROVIDER: Lauree Chandler, NP  RESPONSIBLE PARTY:  Acct ID - Guarantor Home Phone Work Phone Relationship Acct Type  [id number] ADALEEN, HULGAN* [phone]  Self P/F     [address], [place], [place] [address]    PLAN OF CARE and INTERVENTION:  1. ADVANCE CARE PLANNING/GOALS OF CARE: She wants to remain in her home for as long as possible 2. PATIENT/CAREGIVER EDUCATION: Reinforced Safe Mobility/Transfers and Redirection/Reapproaching 3. DISEASE STATUS: Met with patient's daughter and brother initially. Daughter is travelling back and forth from [place] to visit with patient. She is concerned that patient is sleeping 15-18 hours/day and her intake continues to deteriorate. She may only eat about 25% of 1 meal/day and then may not eat again until the next day. Poor fluid intake as well. She is becoming more combative and refusing personal care. During shower, she kept hitting at the caregiver. She often does not want to be bothered. Also refusing medications at times. Increased overall confusion and answers to questions are usually inaccurate. Poor short term memory. Increased urinary incontinence. She is now wearing Depends with an insert. She recently has a Cortisone infection in her R shoulder and R knee. Continues with some R shoulder soreness. When she tried to lift her R arm, she grimaced and said "oooh." She then began using her L arm. She has an appointment with her Nephrologist on Thurs, but daughter is concerned that patient may not make this appointment due to her constant refusals. She was recently seen by her Cardiologist and Lasix was decreased from 40 mg to 20 mg. No edema noted today. Getting daily weights are hit or miss. She does have some intermittent nausea and c/o heart burn/indigestion. Daughter feels patient needs an anti-anxiety medication or anti-depressant. Will  continue to monitor.  HISTORY OF PRESENT ILLNESS:  This is a 83 yo female who resides at home. Her younger brother [name] lives with her and is her caregiver. Palliative Care Team continues to follow patient. Will visit monthly and PRN.  CODE STATUS: FULL CODE  ADVANCED DIRECTIVES: Y MOST FORM: no PPS: 40%   PHYSICAL EXAM:   VITALS: Today's Vitals   12/21/18 1610  BP: (!) 159/113  Pulse: 84  Resp: 18  PainSc: 2   PainLoc: Shoulder    LUNGS: clear to auscultation  CARDIAC: Cor RRR EXTREMITIES: No edema SKIN: No skin issuea  NEURO: Alert and oriented x 2 (person/place), intermittent confusion, poor short term memory, ambulatory   (Duration of visit and documentation 90 minutes)    [name], RN, BSN

## 2019-01-03 ENCOUNTER — Telehealth: Payer: Self-pay | Admitting: *Deleted

## 2019-01-03 ENCOUNTER — Other Ambulatory Visit: Payer: Self-pay | Admitting: Nurse Practitioner

## 2019-01-03 DIAGNOSIS — N390 Urinary tract infection, site not specified: Secondary | ICD-10-CM | POA: Diagnosis not present

## 2019-01-03 MED ORDER — SERTRALINE HCL 50 MG PO TABS: ORAL_TABLET | ORAL | 0 refills | Status: DC

## 2019-01-03 NOTE — Telephone Encounter (Signed)
Patient daughter, [name] called and stated that they want to start the Zoloft 50mg  and would like it sent into the pharmacy. Please Advise.   FYI. Stated that patient is becoming more difficult to even get to an appointment. Stated that she doesn't even want to change her cloths anymore and just fights them.

## 2019-01-03 NOTE — Telephone Encounter (Signed)
Daughter aware.

## 2019-01-03 NOTE — Telephone Encounter (Signed)
We can start zoloft 50 mg by mouth daily-  to take 1/2 tablet 125 mg by mouth daily for 2 weeks then to increase to zoloft 50 mg  Daily. I will need to see pt in office in 4-6 weeks to follow up on medication start.

## 2019-01-10 ENCOUNTER — Other Ambulatory Visit: Payer: Self-pay | Admitting: Adult Health

## 2019-01-18 DIAGNOSIS — C44319 Basal cell carcinoma of skin of other parts of face: Secondary | ICD-10-CM | POA: Diagnosis not present

## 2019-01-28 ENCOUNTER — Other Ambulatory Visit: Payer: Medicare Other | Admitting: *Deleted

## 2019-01-28 DIAGNOSIS — Z515 Encounter for palliative care: Secondary | ICD-10-CM

## 2019-01-31 ENCOUNTER — Other Ambulatory Visit: Payer: Medicare Other | Admitting: *Deleted

## 2019-02-01 NOTE — Progress Notes (Signed)
COMMUNITY PALLIATIVE CARE RN NOTE  PATIENT NAME: Virginia [name] DOB: Nov 08, 1936 MRN: [id number]  PRIMARY CARE PROVIDER: Lauree Chandler, NP  RESPONSIBLE PARTY:  Acct ID - Guarantor Home Phone Work Phone Relationship Acct Type  [id number] [name], [name]* [phone]  Self P/F     [place], [place], [place] [address]    PLAN OF CARE and INTERVENTION:  1. ADVANCE CARE PLANNING/GOALS OF CARE: Brother wants her to remain in her home for as long as possible. She is a Full Code. 2. PATIENT/CAREGIVER EDUCATION: reinforced Safe Mobility, Fall Prevention and Pain Management 3. DISEASE STATUS: Met with patient and brother, [name], in their home. Patient's sister is also present visiting from [place]. Upon my arrival, patient initially standing up in the kitchen looking through drawers. Her gait became unsteady and she almost fell when stepping away from the drawer. Patient had a fall 3 days ago in her garage coming back from an outing. She fell onto her bottom. EMS was called and made sure patient was ok and settled in bed before leaving. Patient reports soreness in her L hip from fall, but remains able to ambulate without assistive devices. She also has chronic pain in her R shoulder, R knee and lower back. She is taking Tylenol 500 mg every 6 hours to help minimize pain. Sister to contact Ortho office to make an appointment for another Cortisone injection to patient's R shoulder and R knee. Patient becoming increasingly more confused. At times does not recognize family members and does not realize that we are in her home. I asked patient how long has she lived in this home and she replied, " I don't live here." She can be argumentative and uncooperative at times. She was started on Zoloft a few weeks ago and is now at the full dose of 50 mg. Mood was pleasant today. She is spending most of her time lying in bed asleep. Her intake continues to decrease and she is skipping some meals. She is taking her  medications without difficulties. She continues to have a Physiological scientist for exercise once weekly and caregiver to assist with her shower twice weekly. She will refuse her shower at times. She requires 1 person assistance with bathing, dressing and toileting. Intermittent bladder incontinence and she wears Depends. She does report some dyspnea with exertion, but recovers quickly at rest. No edema noted. Feet are cool to touch and turn light blue/purple when in dependent position. She has an appointment with her Nephrologist next week for follow up and lab work. Will continue to monitor.   HISTORY OF PRESENT ILLNESS:  This is a 83 yo female who resides at home with her brother as caregiver. Palliative Care Team continues to follow patient. Will continue to visit monthly and PRN.  CODE STATUS: Full Code   ADVANCED DIRECTIVES: Y MOST FORM: no PPS: 40%   PHYSICAL EXAM:   VITALS: Today's Vitals   01/28/19 1607  BP: 119/90  Pulse: 86  Resp: 18  Temp: 97.9 F (36.6 C)  TempSrc: Temporal  SpO2: 94%  PainSc: 2   PainLoc: Hip    LUNGS: clear to auscultation  CARDIAC: Cor RRR EXTREMITIES: No edema, feet are light blue/purple and cool to touch SKIN: Exposed skin is dry and intact  NEURO: Alert and oriented to self only, intermittent confusion, poor short term memory, ambulatory   (Duration of visit and documentation 90 minutes)    [name], RN, BSN

## 2019-02-04 ENCOUNTER — Other Ambulatory Visit: Payer: Self-pay | Admitting: Internal Medicine

## 2019-02-07 ENCOUNTER — Other Ambulatory Visit: Payer: Self-pay | Admitting: Nurse Practitioner

## 2019-02-15 ENCOUNTER — Other Ambulatory Visit: Payer: Self-pay | Admitting: Nurse Practitioner

## 2019-02-15 ENCOUNTER — Other Ambulatory Visit: Payer: Self-pay | Admitting: Adult Health

## 2019-02-16 ENCOUNTER — Other Ambulatory Visit: Payer: Self-pay | Admitting: Nurse Practitioner

## 2019-02-16 DIAGNOSIS — K219 Gastro-esophageal reflux disease without esophagitis: Secondary | ICD-10-CM

## 2019-02-16 NOTE — Telephone Encounter (Signed)
Pantoprazole refill request completed.  Had OV 12/2018.

## 2019-02-21 ENCOUNTER — Other Ambulatory Visit: Payer: Self-pay | Admitting: Adult Health

## 2019-02-21 NOTE — Telephone Encounter (Signed)
Pt brother has called for a refill on donepezil (ARICEPT) 10 MG tablet [facility] [phone]

## 2019-02-21 NOTE — Telephone Encounter (Signed)
Yes okay to do so.

## 2019-02-22 MED ORDER — DONEPEZIL HCL 10 MG PO TABS: ORAL_TABLET | ORAL | 5 refills | Status: DC

## 2019-02-22 NOTE — Addendum Note (Signed)
Addended by: Brandon Melnick on: 02/22/2019 12:55 PM   Modules accepted: Orders

## 2019-02-22 NOTE — Telephone Encounter (Signed)
Refilled for 6 months  

## 2019-03-18 DIAGNOSIS — M19011 Primary osteoarthritis, right shoulder: Secondary | ICD-10-CM | POA: Diagnosis not present

## 2019-03-18 DIAGNOSIS — M19019 Primary osteoarthritis, unspecified shoulder: Secondary | ICD-10-CM | POA: Diagnosis not present

## 2019-03-18 DIAGNOSIS — M1711 Unilateral primary osteoarthritis, right knee: Secondary | ICD-10-CM | POA: Diagnosis not present

## 2019-03-23 ENCOUNTER — Telehealth: Payer: Self-pay | Admitting: Adult Health

## 2019-03-23 NOTE — Telephone Encounter (Signed)
Last seen 03-2018.  Needs appt.

## 2019-03-28 NOTE — Telephone Encounter (Signed)
Patient has been added to [facility] and e-mail has been sent.

## 2019-03-28 NOTE — Telephone Encounter (Signed)
Pts brother called in to get pt set up for appt so that she will be able to get her refill, appt set for 03/30/2019 at 1:15pm, consent for VV was given , insurance information given   Email-  [email or web address]

## 2019-03-30 ENCOUNTER — Ambulatory Visit: Payer: Medicare Other | Admitting: Neurology

## 2019-03-30 NOTE — Progress Notes (Deleted)
Virtual Visit via Video Note  I connected with Virginia [name] on 03/30/19 at  1:15 PM EDT by a video enabled telemedicine application and verified that I am speaking with the correct person using two identifiers.   I discussed the limitations of evaluation and management by telemedicine and the availability of in person appointments. The patient expressed understanding and agreed to proceed.  History of Present Illness: 03/30/2019 SS: Virginia [name] is an 83 year old female with history of memory disturbance.  She is currently on Aricept and Namenda.  She lives with her brother.  In May 2019 her MMSE was 15/30  04/05/2018 MM: Virginia [name] is an 83 year old female with a history of memory disturbance.  She returns today for follow-up.  She is currently on Aricept and Namenda.  She continues to tolerate these medications well.  The patient does not feel that there is been much change in her memory.  She continues to live with her brother.  She does require some assistance with ADLs.  She has an aide that comes in twice a week to help her bathe.  She does not operate a motor vehicle.  She does have assistance with her finances.  Denies any trouble sleeping.  Reports occasionally she will have a vivid dream.  Denies hallucinations.  Denies any changes in her mood or behavior.  She returns today for evaluation.   Observations/Objective:   Assessment and Plan:   Follow Up Instructions:    I discussed the assessment and treatment plan with the patient. The patient was provided an opportunity to ask questions and all were answered. The patient agreed with the plan and demonstrated an understanding of the instructions.   The patient was advised to call back or seek an in-person evaluation if the symptoms worsen or if the condition fails to improve as anticipated.  I provided *** minutes of non-face-to-face time during this encounter.   Suzzanne Cloud, NP

## 2019-03-31 ENCOUNTER — Other Ambulatory Visit: Payer: Self-pay

## 2019-03-31 ENCOUNTER — Other Ambulatory Visit: Payer: Medicare Other | Admitting: *Deleted

## 2019-03-31 DIAGNOSIS — Z515 Encounter for palliative care: Secondary | ICD-10-CM

## 2019-03-31 NOTE — Progress Notes (Signed)
COMMUNITY PALLIATIVE CARE RN NOTE  PATIENT NAME: Virginia [name] DOB: 10-Feb-1936 MRN: [id number]  PRIMARY CARE PROVIDER: Lauree Chandler, NP  RESPONSIBLE PARTY:  Acct ID - Guarantor Home Phone Work Phone Relationship Acct Type  [id number] Virginia [name], Virginia [name]* [phone]  Self P/F     [address], [place], [place] [address]   Due to the COVID-19 crisis, this virtual check-in visit was done via telephone from my office and it was initiated and consent by this patient and or family.  PLAN OF CARE and INTERVENTION:  1. ADVANCE CARE PLANNING/GOALS OF CARE: Goal is for patient to remain at home with her brother as caregiver. 2. PATIENT/CAREGIVER EDUCATION: Reinforced Safe Mobility 3. DISEASE STATUS: Virtual check-in visit completed via telephone. Patient has been experiencing some pain in her R shoulder and R knee. Last Cortisone injections were done to both areas on 03/18/19. Pain is slightly better, but still present. She usually has injections every 3 months, but [name] says he is unsure as to how many more times she will be able to have this done. She is taking Tylenol PRN to help as well. She remains ambulatory, but at times will use her walker on days she is more unsteady (has to be her choice). No recent falls. Her brother, [name], reports that her short term memory continues to worsen. She recently just spoke with her daughter over the telephone, and 5 minutes later when asked, she did not remember having a conversation. There are times where she does not recognize family members. She continues to sleep more overall. Her intake is variable. Sometimes he may have to re-approach patient with her daily medications when she does not want to take them when initially offered. She remains on Zoloft, which family feels has been helpful. She continues on Lasix every other day. [name] says he will give an additional tablet with increased leg/ankle swelling. Reinforced daily weights if possible. She does continue  with a CNA coming to assist with baths 2x/week and with washing her hair. She is averaging 1 shower per week d/t refusals. She has started back smoking cigarettes on a regular basis. Will continue to monitor.  HISTORY OF PRESENT ILLNESS:  This is a 83 yo female who resides at home. Her brother [name] livers with her and is her caregiver. Palliative Care Team continues to follow patient. Will continue to check in monthly and PRN.  CODE STATUS: Full Code ADVANCED DIRECTIVES: Y MOST FORM: no PPS: 40%   (Duration of visit and documentation 45 minutes)    [name], RN BSN

## 2019-04-05 ENCOUNTER — Telehealth: Payer: Self-pay | Admitting: Neurology

## 2019-04-05 ENCOUNTER — Encounter: Payer: Medicare Other | Admitting: Neurology

## 2019-04-05 ENCOUNTER — Other Ambulatory Visit: Payer: Self-pay

## 2019-04-05 MED ORDER — MEMANTINE HCL 10 MG PO TABS: 10.0000 mg | ORAL_TABLET | Freq: Two times a day (BID) | ORAL | 0 refills | Status: DC

## 2019-04-05 MED ORDER — DONEPEZIL HCL 10 MG PO TABS: ORAL_TABLET | ORAL | 0 refills | Status: DC

## 2019-04-05 NOTE — Telephone Encounter (Signed)
Ms. Shoe will schedule for a VV at 345 today.  Her brother is calling saying that she is not able to make the appointment.  Apparently he and her son have tried to convince her to do the appointment.  She has gone back to bed and does not wish to participate.  Her brother is very apologetic and concerned because they are almost out of for her Aricept and Namenda.  I will send in a 30-day refill for both medications. He will call back in the next week or so to get her rescheduled.

## 2019-04-05 NOTE — Progress Notes (Signed)
This encounter was created in error - please disregard.

## 2019-04-19 DIAGNOSIS — I10 Essential (primary) hypertension: Secondary | ICD-10-CM | POA: Diagnosis not present

## 2019-04-20 ENCOUNTER — Other Ambulatory Visit: Payer: Self-pay

## 2019-04-20 ENCOUNTER — Emergency Department (HOSPITAL_COMMUNITY): Payer: Medicare Other

## 2019-04-20 ENCOUNTER — Encounter (HOSPITAL_COMMUNITY): Payer: Self-pay

## 2019-04-20 ENCOUNTER — Inpatient Hospital Stay (HOSPITAL_COMMUNITY)
Admission: EM | Admit: 2019-04-20 | Discharge: 2019-04-27 | DRG: 480 | Disposition: A | Discharge status: Home Health | Payer: Medicare Other | Attending: Internal Medicine | Admitting: Internal Medicine

## 2019-04-20 ENCOUNTER — Inpatient Hospital Stay (HOSPITAL_COMMUNITY): Payer: Medicare Other

## 2019-04-20 DIAGNOSIS — S8991XA Unspecified injury of right lower leg, initial encounter: Secondary | ICD-10-CM | POA: Diagnosis not present

## 2019-04-20 DIAGNOSIS — Z9842 Cataract extraction status, left eye: Secondary | ICD-10-CM | POA: Diagnosis not present

## 2019-04-20 DIAGNOSIS — S299XXA Unspecified injury of thorax, initial encounter: Secondary | ICD-10-CM | POA: Diagnosis not present

## 2019-04-20 DIAGNOSIS — W1830XA Fall on same level, unspecified, initial encounter: Secondary | ICD-10-CM | POA: Diagnosis present

## 2019-04-20 DIAGNOSIS — I10 Essential (primary) hypertension: Secondary | ICD-10-CM | POA: Diagnosis not present

## 2019-04-20 DIAGNOSIS — Y92013 Bedroom of single-family (private) house as the place of occurrence of the external cause: Secondary | ICD-10-CM

## 2019-04-20 DIAGNOSIS — Y92009 Unspecified place in unspecified non-institutional (private) residence as the place of occurrence of the external cause: Secondary | ICD-10-CM

## 2019-04-20 DIAGNOSIS — F17210 Nicotine dependence, cigarettes, uncomplicated: Secondary | ICD-10-CM | POA: Diagnosis present

## 2019-04-20 DIAGNOSIS — Z9889 Other specified postprocedural states: Secondary | ICD-10-CM | POA: Diagnosis not present

## 2019-04-20 DIAGNOSIS — M79604 Pain in right leg: Secondary | ICD-10-CM | POA: Diagnosis not present

## 2019-04-20 DIAGNOSIS — D72829 Elevated white blood cell count, unspecified: Secondary | ICD-10-CM | POA: Diagnosis not present

## 2019-04-20 DIAGNOSIS — K219 Gastro-esophageal reflux disease without esophagitis: Secondary | ICD-10-CM | POA: Diagnosis present

## 2019-04-20 DIAGNOSIS — W19XXXA Unspecified fall, initial encounter: Secondary | ICD-10-CM

## 2019-04-20 DIAGNOSIS — I2510 Atherosclerotic heart disease of native coronary artery without angina pectoris: Secondary | ICD-10-CM | POA: Diagnosis present

## 2019-04-20 DIAGNOSIS — E785 Hyperlipidemia, unspecified: Secondary | ICD-10-CM | POA: Diagnosis present

## 2019-04-20 DIAGNOSIS — E559 Vitamin D deficiency, unspecified: Secondary | ICD-10-CM | POA: Diagnosis present

## 2019-04-20 DIAGNOSIS — M8949 Other hypertrophic osteoarthropathy, multiple sites: Secondary | ICD-10-CM | POA: Diagnosis present

## 2019-04-20 DIAGNOSIS — Z1159 Encounter for screening for other viral diseases: Secondary | ICD-10-CM

## 2019-04-20 DIAGNOSIS — Z79899 Other long term (current) drug therapy: Secondary | ICD-10-CM | POA: Diagnosis not present

## 2019-04-20 DIAGNOSIS — F0391 Unspecified dementia with behavioral disturbance: Secondary | ICD-10-CM | POA: Diagnosis present

## 2019-04-20 DIAGNOSIS — Z7951 Long term (current) use of inhaled steroids: Secondary | ICD-10-CM

## 2019-04-20 DIAGNOSIS — Z808 Family history of malignant neoplasm of other organs or systems: Secondary | ICD-10-CM

## 2019-04-20 DIAGNOSIS — S72009A Fracture of unspecified part of neck of unspecified femur, initial encounter for closed fracture: Secondary | ICD-10-CM

## 2019-04-20 DIAGNOSIS — Z8249 Family history of ischemic heart disease and other diseases of the circulatory system: Secondary | ICD-10-CM

## 2019-04-20 DIAGNOSIS — R059 Cough, unspecified: Secondary | ICD-10-CM

## 2019-04-20 DIAGNOSIS — Z419 Encounter for procedure for purposes other than remedying health state, unspecified: Secondary | ICD-10-CM

## 2019-04-20 DIAGNOSIS — J440 Chronic obstructive pulmonary disease with acute lower respiratory infection: Secondary | ICD-10-CM | POA: Diagnosis present

## 2019-04-20 DIAGNOSIS — R05 Cough: Secondary | ICD-10-CM

## 2019-04-20 DIAGNOSIS — I509 Heart failure, unspecified: Secondary | ICD-10-CM

## 2019-04-20 DIAGNOSIS — N183 Chronic kidney disease, stage 3 (moderate): Secondary | ICD-10-CM | POA: Diagnosis present

## 2019-04-20 DIAGNOSIS — Z9841 Cataract extraction status, right eye: Secondary | ICD-10-CM | POA: Diagnosis not present

## 2019-04-20 DIAGNOSIS — S72143A Displaced intertrochanteric fracture of unspecified femur, initial encounter for closed fracture: Secondary | ICD-10-CM

## 2019-04-20 DIAGNOSIS — I5033 Acute on chronic diastolic (congestive) heart failure: Secondary | ICD-10-CM | POA: Diagnosis not present

## 2019-04-20 DIAGNOSIS — R0902 Hypoxemia: Secondary | ICD-10-CM | POA: Diagnosis not present

## 2019-04-20 DIAGNOSIS — J449 Chronic obstructive pulmonary disease, unspecified: Secondary | ICD-10-CM | POA: Diagnosis not present

## 2019-04-20 DIAGNOSIS — J209 Acute bronchitis, unspecified: Secondary | ICD-10-CM | POA: Diagnosis present

## 2019-04-20 DIAGNOSIS — I5032 Chronic diastolic (congestive) heart failure: Secondary | ICD-10-CM | POA: Diagnosis not present

## 2019-04-20 DIAGNOSIS — Z803 Family history of malignant neoplasm of breast: Secondary | ICD-10-CM

## 2019-04-20 DIAGNOSIS — Z833 Family history of diabetes mellitus: Secondary | ICD-10-CM

## 2019-04-20 DIAGNOSIS — F0390 Unspecified dementia without behavioral disturbance: Secondary | ICD-10-CM | POA: Diagnosis present

## 2019-04-20 DIAGNOSIS — Z03818 Encounter for observation for suspected exposure to other biological agents ruled out: Secondary | ICD-10-CM | POA: Diagnosis not present

## 2019-04-20 DIAGNOSIS — R52 Pain, unspecified: Secondary | ICD-10-CM | POA: Diagnosis not present

## 2019-04-20 DIAGNOSIS — S7291XA Unspecified fracture of right femur, initial encounter for closed fracture: Secondary | ICD-10-CM | POA: Diagnosis not present

## 2019-04-20 DIAGNOSIS — Z7401 Bed confinement status: Secondary | ICD-10-CM | POA: Diagnosis not present

## 2019-04-20 DIAGNOSIS — I130 Hypertensive heart and chronic kidney disease with heart failure and stage 1 through stage 4 chronic kidney disease, or unspecified chronic kidney disease: Secondary | ICD-10-CM | POA: Diagnosis present

## 2019-04-20 DIAGNOSIS — S72001A Fracture of unspecified part of neck of right femur, initial encounter for closed fracture: Secondary | ICD-10-CM

## 2019-04-20 DIAGNOSIS — N179 Acute kidney failure, unspecified: Secondary | ICD-10-CM | POA: Diagnosis present

## 2019-04-20 DIAGNOSIS — S72141A Displaced intertrochanteric fracture of right femur, initial encounter for closed fracture: Secondary | ICD-10-CM | POA: Diagnosis present

## 2019-04-20 DIAGNOSIS — M2550 Pain in unspecified joint: Secondary | ICD-10-CM | POA: Diagnosis not present

## 2019-04-20 DIAGNOSIS — E669 Obesity, unspecified: Secondary | ICD-10-CM | POA: Diagnosis present

## 2019-04-20 DIAGNOSIS — S72101A Unspecified trochanteric fracture of right femur, initial encounter for closed fracture: Secondary | ICD-10-CM | POA: Diagnosis not present

## 2019-04-20 DIAGNOSIS — Z888 Allergy status to other drugs, medicaments and biological substances status: Secondary | ICD-10-CM

## 2019-04-20 DIAGNOSIS — F419 Anxiety disorder, unspecified: Secondary | ICD-10-CM | POA: Diagnosis present

## 2019-04-20 DIAGNOSIS — S728X1A Other fracture of right femur, initial encounter for closed fracture: Secondary | ICD-10-CM | POA: Diagnosis not present

## 2019-04-20 HISTORY — DX: Unspecified urinary incontinence: R32

## 2019-04-20 HISTORY — DX: Family history of other specified conditions: Z84.89

## 2019-04-20 HISTORY — DX: Displaced intertrochanteric fracture of unspecified femur, initial encounter for closed fracture: S72.143A

## 2019-04-20 LAB — CBC WITH DIFFERENTIAL/PLATELET
Abs Immature Granulocytes: 0.16 10*3/uL — ABNORMAL HIGH (ref 0.00–0.07)
Basophils Absolute: 0 10*3/uL (ref 0.0–0.1)
Basophils Relative: 0 %
Eosinophils Absolute: 0.1 10*3/uL (ref 0.0–0.5)
Eosinophils Relative: 1 %
HCT: 43.2 % (ref 36.0–46.0)
Hemoglobin: 13.6 g/dL (ref 12.0–15.0)
Immature Granulocytes: 2 %
Lymphocytes Relative: 13 %
Lymphs Abs: 1.4 10*3/uL (ref 0.7–4.0)
MCH: 29.7 pg (ref 26.0–34.0)
MCHC: 31.5 g/dL (ref 30.0–36.0)
MCV: 94.3 fL (ref 80.0–100.0)
Monocytes Absolute: 1 10*3/uL (ref 0.1–1.0)
Monocytes Relative: 9 %
Neutro Abs: 8 10*3/uL — ABNORMAL HIGH (ref 1.7–7.7)
Neutrophils Relative %: 75 %
Platelets: 234 10*3/uL (ref 150–400)
RBC: 4.58 MIL/uL (ref 3.87–5.11)
RDW: 13.8 % (ref 11.5–15.5)
WBC: 10.7 10*3/uL — ABNORMAL HIGH (ref 4.0–10.5)
nRBC: 0 % (ref 0.0–0.2)

## 2019-04-20 LAB — SURGICAL PCR SCREEN
MRSA, PCR: NEGATIVE
Staphylococcus aureus: NEGATIVE

## 2019-04-20 LAB — BASIC METABOLIC PANEL
Anion gap: 9 (ref 5–15)
BUN: 14 mg/dL (ref 8–23)
CO2: 27 mmol/L (ref 22–32)
Calcium: 9 mg/dL (ref 8.9–10.3)
Chloride: 106 mmol/L (ref 98–111)
Creatinine, Ser: 1.1 mg/dL — ABNORMAL HIGH (ref 0.44–1.00)
GFR calc Af Amer: 55 mL/min — ABNORMAL LOW (ref >60)
GFR calc non Af Amer: 47 mL/min — ABNORMAL LOW (ref >60)
Glucose, Bld: 145 mg/dL — ABNORMAL HIGH (ref 70–99)
Potassium: 4.1 mmol/L (ref 3.5–5.1)
Sodium: 142 mmol/L (ref 135–145)

## 2019-04-20 LAB — PROTIME-INR
INR: 1 (ref 0.8–1.2)
Prothrombin Time: 12.9 seconds (ref 11.4–15.2)

## 2019-04-20 LAB — TYPE AND SCREEN
ABO/RH(D): O POS
Antibody Screen: NEGATIVE

## 2019-04-20 LAB — SARS CORONAVIRUS 2 BY RT PCR (HOSPITAL ORDER, PERFORMED IN ~~LOC~~ HOSPITAL LAB): SARS Coronavirus 2: NEGATIVE

## 2019-04-20 MED ORDER — HYDROCODONE-ACETAMINOPHEN 5-325 MG PO TABS
1.0000 | ORAL_TABLET | Freq: Four times a day (QID) | ORAL | Status: DC | PRN
  Administered 2019-04-22 (×3): 1 via ORAL
  Administered 2019-04-23 – 2019-04-25 (×5): 2 via ORAL
  Administered 2019-04-26: 10:00:00 1 via ORAL
  Filled 2019-04-20 (×2): qty 2
  Filled 2019-04-20: qty 1
  Filled 2019-04-20: qty 2
  Filled 2019-04-20 (×4): qty 1
  Filled 2019-04-20 (×2): qty 2
  Filled 2019-04-20: qty 1
  Filled 2019-04-20: qty 2

## 2019-04-20 MED ORDER — ENOXAPARIN SODIUM 40 MG/0.4ML ~~LOC~~ SOLN
40.0000 mg | SUBCUTANEOUS | Status: DC
  Filled 2019-04-20: qty 0.4

## 2019-04-20 MED ORDER — FENTANYL CITRATE (PF) 100 MCG/2ML IJ SOLN
50.0000 ug | INTRAMUSCULAR | Status: AC | PRN
  Administered 2019-04-20 (×2): 50 ug via INTRAVENOUS
  Filled 2019-04-20: qty 2

## 2019-04-20 MED ORDER — POLYVINYL ALCOHOL 1.4 % OP SOLN
1.0000 [drp] | OPHTHALMIC | Status: DC | PRN
  Filled 2019-04-20: qty 15

## 2019-04-20 MED ORDER — CHLORHEXIDINE GLUCONATE 4 % EX LIQD: 60.0000 mL | Freq: Once | CUTANEOUS | Status: DC

## 2019-04-20 MED ORDER — DONEPEZIL HCL 10 MG PO TABS
10.0000 mg | ORAL_TABLET | Freq: Every day | ORAL | Status: DC
  Administered 2019-04-22 – 2019-04-27 (×5): 10 mg via ORAL
  Filled 2019-04-20 (×7): qty 1

## 2019-04-20 MED ORDER — DICLOFENAC SODIUM 1 % TD GEL
2.0000 g | Freq: Two times a day (BID) | TRANSDERMAL | Status: DC | PRN
  Filled 2019-04-20: qty 100

## 2019-04-20 MED ORDER — CARBINOXAMINE MALEATE 4 MG PO TABS: 4.0000 mg | ORAL_TABLET | Freq: Three times a day (TID) | ORAL | Status: DC | PRN

## 2019-04-20 MED ORDER — HYDRALAZINE HCL 20 MG/ML IJ SOLN: 10.0000 mg | INTRAMUSCULAR | Status: DC | PRN

## 2019-04-20 MED ORDER — SODIUM CHLORIDE 0.9 % IV SOLN
INTRAVENOUS | Status: DC
  Administered 2019-04-20 – 2019-04-24 (×2): via INTRAVENOUS

## 2019-04-20 MED ORDER — IPRATROPIUM-ALBUTEROL 0.5-2.5 (3) MG/3ML IN SOLN: 3.0000 mL | Freq: Four times a day (QID) | RESPIRATORY_TRACT | Status: DC | PRN

## 2019-04-20 MED ORDER — IPRATROPIUM-ALBUTEROL 0.5-2.5 (3) MG/3ML IN SOLN
3.0000 mL | Freq: Four times a day (QID) | RESPIRATORY_TRACT | Status: DC
  Filled 2019-04-20: qty 3

## 2019-04-20 MED ORDER — FENTANYL CITRATE (PF) 100 MCG/2ML IJ SOLN
50.0000 ug | INTRAMUSCULAR | Status: DC | PRN
  Administered 2019-04-20: 50 ug via INTRAVENOUS
  Filled 2019-04-20 (×2): qty 2

## 2019-04-20 MED ORDER — CEFAZOLIN SODIUM-DEXTROSE 2-4 GM/100ML-% IV SOLN
2.0000 g | INTRAVENOUS | Status: AC
  Administered 2019-04-21: 2 g via INTRAVENOUS
  Filled 2019-04-20: qty 100

## 2019-04-20 MED ORDER — FUROSEMIDE 20 MG PO TABS
20.0000 mg | ORAL_TABLET | Freq: Every day | ORAL | Status: DC
  Administered 2019-04-22 – 2019-04-23 (×2): 20 mg via ORAL
  Filled 2019-04-20 (×4): qty 1

## 2019-04-20 MED ORDER — LORAZEPAM 2 MG/ML IJ SOLN
0.5000 mg | Freq: Four times a day (QID) | INTRAMUSCULAR | Status: DC | PRN
  Administered 2019-04-20 – 2019-04-25 (×3): 0.5 mg via INTRAVENOUS
  Filled 2019-04-20 (×3): qty 1

## 2019-04-20 MED ORDER — SERTRALINE HCL 50 MG PO TABS
50.0000 mg | ORAL_TABLET | Freq: Every day | ORAL | Status: DC
  Administered 2019-04-21 – 2019-04-26 (×4): 50 mg via ORAL
  Filled 2019-04-20 (×6): qty 1

## 2019-04-20 MED ORDER — ONDANSETRON HCL 4 MG/2ML IJ SOLN
4.0000 mg | Freq: Once | INTRAMUSCULAR | Status: AC
  Administered 2019-04-20: 4 mg via INTRAVENOUS
  Filled 2019-04-20: qty 2

## 2019-04-20 MED ORDER — FAMOTIDINE 20 MG PO TABS
40.0000 mg | ORAL_TABLET | Freq: Two times a day (BID) | ORAL | Status: DC
  Administered 2019-04-21 – 2019-04-27 (×9): 40 mg via ORAL
  Filled 2019-04-20 (×13): qty 2

## 2019-04-20 MED ORDER — MEMANTINE HCL 10 MG PO TABS
10.0000 mg | ORAL_TABLET | Freq: Two times a day (BID) | ORAL | Status: DC
  Administered 2019-04-22 – 2019-04-27 (×8): 10 mg via ORAL
  Filled 2019-04-20 (×13): qty 1

## 2019-04-20 MED ORDER — ALBUTEROL SULFATE (2.5 MG/3ML) 0.083% IN NEBU
5.0000 mg | INHALATION_SOLUTION | RESPIRATORY_TRACT | Status: DC | PRN
  Administered 2019-04-27: 5 mg via RESPIRATORY_TRACT
  Filled 2019-04-20: qty 6

## 2019-04-20 MED ORDER — FLUTICASONE PROPIONATE 50 MCG/ACT NA SUSP
2.0000 | Freq: Every day | NASAL | Status: DC
  Administered 2019-04-22 – 2019-04-27 (×5): 2 via NASAL
  Filled 2019-04-20: qty 16

## 2019-04-20 MED ORDER — POVIDONE-IODINE 10 % EX SWAB: 2.0000 "application " | Freq: Once | CUTANEOUS | Status: DC

## 2019-04-20 MED ORDER — IPRATROPIUM BROMIDE 0.06 % NA SOLN
2.0000 | Freq: Two times a day (BID) | NASAL | Status: DC
  Administered 2019-04-25 – 2019-04-27 (×3): 2 via NASAL
  Filled 2019-04-20: qty 15

## 2019-04-20 MED ORDER — MORPHINE SULFATE (PF) 2 MG/ML IV SOLN
0.5000 mg | INTRAVENOUS | Status: DC | PRN
  Administered 2019-04-20 – 2019-04-21 (×5): 0.5 mg via INTRAVENOUS
  Filled 2019-04-20 (×6): qty 1

## 2019-04-20 MED ORDER — FLUTICASONE FUROATE-VILANTEROL 100-25 MCG/INH IN AEPB
1.0000 | INHALATION_SPRAY | Freq: Every day | RESPIRATORY_TRACT | Status: DC
  Administered 2019-04-23 – 2019-04-27 (×3): 1 via RESPIRATORY_TRACT
  Filled 2019-04-20: qty 28

## 2019-04-20 MED ORDER — NEBIVOLOL HCL 2.5 MG PO TABS
2.5000 mg | ORAL_TABLET | Freq: Every day | ORAL | Status: DC
  Administered 2019-04-21 – 2019-04-27 (×6): 2.5 mg via ORAL
  Filled 2019-04-20 (×9): qty 1

## 2019-04-20 NOTE — Progress Notes (Signed)
Patient continue to refuse care from staff, Ativan given.

## 2019-04-20 NOTE — ED Triage Notes (Signed)
Per EMS pt with a hx of dementia fell while ambulating. Family reports finding patient on the floor sitting upright. EMS reports no LOC. Pt complains of lower back pain and right hip pain. Pt has shortening and lateral rotation of right leg. Pt received 154mcg fentanyl en route.

## 2019-04-20 NOTE — Consult Note (Signed)
Reason for Consult:Right hip fx Referring Physician: R Ronisha Herringshaw is an 83 y.o. female.  HPI: Blimy fell and was found shortly after sitting upright. She was having significant low back and right hip pain and could not get up. She was brought to the hospital where x-rays showed a right hip fx and orthopedic surgery was consulted. She has significant dementia and cannot contribute to history or much to exam.  Past Medical History:  Diagnosis Date  . Anxiety 07/08/2017  . Arthritis   . Carotid artery disease ([place])    a. s/p L CEA. b. followed by [facility].  . Carotid stenosis 10/19/2012  . Chronic diastolic CHF (congestive heart failure) ([place])   . CKD (chronic kidney disease), stage III ([place])   . Congestive heart disease ([place])   . COPD (chronic obstructive pulmonary disease) ([place])   . COPD GOLD IV 12/30/2014   PFTs 12/15/14 FEV1  0.57 (29%) ratio 54 p 17% resp to saba    . Cough 01/31/2016  . Dementia with behavioral disturbance ([place]) 07/08/2017  . Diverticulosis   . Dyspnea   . Essential hypertension 12/02/2014  . Fall at home Sept. 2013  . Gastroesophageal reflux disease 07/08/2017  . Hyperlipidemia   . Hypertension   . Hyponatremia 12/02/2014  . Hypotension 02/01/2017  . Internal hemorrhoid   . Memory disorder 10/24/2014  . Nausea and vomiting 11/30/2014  . Primary osteoarthritis involving multiple joints 07/08/2017  . Vitamin D deficiency 02/15/2015    Past Surgical History:  Procedure Laterality Date  . APPENDECTOMY    . BREAST REDUCTION SURGERY    . CAROTID ENDARTERECTOMY     left CEA  . CATARACT EXTRACTION Bilateral   . COLONOSCOPY  2015  . EYE SURGERY     cataracts removed, bilaterally   . skin cancer removal    . TONSILLECTOMY      Family History  Problem Relation Age of Onset  . Heart disease Father        Before age 45  . Hypertension Father   . Heart attack Father   . Cancer Mother 72       Brain  . Dementia Brother   . Cancer Maternal Aunt 61       breast  cancer  . Diabetes Maternal Aunt   . Heart failure Maternal Grandmother   . Diabetes Maternal Grandmother   . Stroke Neg Hx     Social History:  reports that she has been smoking cigarettes. She has a 5.00 pack-year smoking history. She has never used smokeless tobacco. She reports that she does not drink alcohol or use drugs.  Allergies:  Allergies  Allergen Reactions  . Lidocaine Anaphylaxis, Swelling, Rash and Other (See Comments)    Any of the " [facility] "  . Other Other (See Comments)    All drugs that end in "cane'-  novacane etc. Daughter states patient almost died when she had it when she was born    Medications: I have reviewed the patient's current medications.  Results for orders placed or performed during the hospital encounter of 04/20/19 (from the past 48 hour(s))  Type and screen [place]     Status: None   Collection Time: 04/20/19  4:53 AM  Result Value Ref Range   ABO/RH(D) O POS    Antibody Screen NEG    Sample Expiration      04/23/2019,2359 Performed at [facility], [place] [address],  [place], [place] [address]   Basic metabolic panel     Status: Abnormal   Collection Time: 04/20/19  4:56 AM  Result Value Ref Range   Sodium 142 135 - 145 mmol/L   Potassium 4.1 3.5 - 5.1 mmol/L   Chloride 106 98 - 111 mmol/L   CO2 27 22 - 32 mmol/L   Glucose, Bld 145 (H) 70 - 99 mg/dL   BUN 14 8 - 23 mg/dL   Creatinine, Ser 1.10 (H) 0.44 - 1.00 mg/dL   Calcium 9.0 8.9 - 10.3 mg/dL   GFR calc non Af Amer 47 (L) >60 mL/min   GFR calc Af Amer 55 (L) >60 mL/min   Anion gap 9 5 - 15    Comment: Performed at [facility] [address], [place], [place] [address]  CBC WITH DIFFERENTIAL     Status: Abnormal   Collection Time: 04/20/19  4:56 AM  Result Value Ref Range   WBC 10.7 (H) 4.0 - 10.5 K/uL   RBC 4.58 3.87 - 5.11 MIL/uL   Hemoglobin 13.6 12.0 - 15.0 g/dL   HCT 43.2 36.0 - 46.0 %   MCV 94.3 80.0 - 100.0 fL   MCH 29.7 26.0 -  34.0 pg   MCHC 31.5 30.0 - 36.0 g/dL   RDW 13.8 11.5 - 15.5 %   Platelets 234 150 - 400 K/uL   nRBC 0.0 0.0 - 0.2 %   Neutrophils Relative % 75 %   Neutro Abs 8.0 (H) 1.7 - 7.7 K/uL   Lymphocytes Relative 13 %   Lymphs Abs 1.4 0.7 - 4.0 K/uL   Monocytes Relative 9 %   Monocytes Absolute 1.0 0.1 - 1.0 K/uL   Eosinophils Relative 1 %   Eosinophils Absolute 0.1 0.0 - 0.5 K/uL   Basophils Relative 0 %   Basophils Absolute 0.0 0.0 - 0.1 K/uL   Immature Granulocytes 2 %   Abs Immature Granulocytes 0.16 (H) 0.00 - 0.07 K/uL    Comment: Performed at [facility], [address], [place], [place] [address]  Protime-INR     Status: None   Collection Time: 04/20/19  4:56 AM  Result Value Ref Range   Prothrombin Time 12.9 11.4 - 15.2 seconds   INR 1.0 0.8 - 1.2    Comment: (NOTE) INR goal varies based on device and disease states. Performed at [facility], [place] [address] [place], [place] [address]   SARS Coronavirus 2 (CEPHEID - Performed in [place] hospital lab), Hosp Order     Status: None   Collection Time: 04/20/19  5:34 AM  Result Value Ref Range   SARS Coronavirus 2 NEGATIVE NEGATIVE    Comment: (NOTE) If result is NEGATIVE SARS-CoV-2 target nucleic acids are NOT DETECTED. The SARS-CoV-2 RNA is generally detectable in upper and lower  respiratory specimens during the acute phase of infection. The lowest  concentration of SARS-CoV-2 viral copies this assay can detect is 250  copies / mL. A negative result does not preclude SARS-CoV-2 infection  and should not be used as the sole basis for treatment or other  patient management decisions.  A negative result may occur with  improper specimen collection / handling, submission of specimen other  than nasopharyngeal swab, presence of viral mutation(s) within the  areas targeted by this assay, and inadequate number of viral copies  (<250 copies / mL). A negative result must be combined with clinical  observations,  patient history, and epidemiological  information. If result is POSITIVE SARS-CoV-2 target nucleic acids are DETECTED. The SARS-CoV-2 RNA is generally detectable in upper and lower  respiratory specimens dur ing the acute phase of infection.  Positive  results are indicative of active infection with SARS-CoV-2.  Clinical  correlation with patient history and other diagnostic information is  necessary to determine patient infection status.  Positive results do  not rule out bacterial infection or co-infection with other viruses. If result is PRESUMPTIVE POSTIVE SARS-CoV-2 nucleic acids MAY BE PRESENT.   A presumptive positive result was obtained on the submitted specimen  and confirmed on repeat testing.  While 2019 novel coronavirus  (SARS-CoV-2) nucleic acids may be present in the submitted sample  additional confirmatory testing may be necessary for epidemiological  and / or clinical management purposes  to differentiate between  SARS-CoV-2 and other Sarbecovirus currently known to infect humans.  If clinically indicated additional testing with an alternate test  methodology [phone]) is advised. The SARS-CoV-2 RNA is generally  detectable in upper and lower respiratory sp ecimens during the acute  phase of infection. The expected result is Negative. Fact Sheet for Patients:  StrictlyIdeas.no Fact Sheet for Healthcare Providers: BankingDealers.co.za This test is not yet approved or cleared by the [place] FDA and has been authorized for detection and/or diagnosis of SARS-CoV-2 by FDA under an Emergency Use Authorization (EUA).  This EUA will remain in effect (meaning this test can be used) for the duration of the COVID-19 declaration under Section 564(b)(1) of the Act, 21 U.S.C. section 360bbb-3(b)(1), unless the authorization is terminated or revoked sooner. Performed at [facility], [place] [address], [place],  [place] [address]     Dg Chest 1 View  Result Date: 04/20/2019 CLINICAL DATA:  Fall EXAM: CHEST  1 VIEW COMPARISON:  02/02/2017 FINDINGS: Chronic cardiomegaly. Stable mediastinal contours. Interstitial coarsening that is stable. No visible acute fracture. There has been remote fracturing of the proximal right humerus. IMPRESSION: Stable, no evidence of acute cardiopulmonary disease. Electronically Signed   By: Monte Fantasia M.D.   On: 04/20/2019 05:41   Dg Tibia/fibula Right  Result Date: 04/20/2019 CLINICAL DATA:  Fall with leg pain EXAM: RIGHT TIBIA AND FIBULA - 2 VIEW COMPARISON:  None. FINDINGS: No evidence of acute fracture or dislocation. There is knee osteoarthritis with medial compartment joint narrowing and spurring. IMPRESSION: 1. No acute finding. 2. Knee osteoarthritis with medial compartment collapse. Electronically Signed   By: Monte Fantasia M.D.   On: 04/20/2019 05:43   Dg Hip Unilat With Pelvis 2-3 Views Right  Result Date: 04/20/2019 CLINICAL DATA:  Fall EXAM: DG HIP (WITH OR WITHOUT PELVIS) 2-3V RIGHT COMPARISON:  None. FINDINGS: Comminuted intertrochanteric right femur fracture with varus angulation and lesser trochanter retraction. Osteopenia. No hip dislocation. IMPRESSION: Displaced intertrochanteric femur fracture on the right. Electronically Signed   By: Monte Fantasia M.D.   On: 04/20/2019 05:42    Review of Systems  Unable to perform ROS: Dementia   Blood pressure (!) 151/81, pulse 95, temperature 98.3 F (36.8 C), temperature source Oral, resp. rate 19, height 5\' 2"  (1.575 m), weight 80.7 kg, SpO2 93 %. Physical Exam  Constitutional: She appears well-developed and well-nourished. No distress.  HENT:  Head: Normocephalic and atraumatic.  Eyes: Conjunctivae are normal. Right eye exhibits no discharge. Left eye exhibits no discharge. No scleral icterus.  Neck: Normal range of motion.  Cardiovascular: Normal rate and regular rhythm.  Respiratory: Effort normal. No  respiratory distress.  Musculoskeletal:  Comments: RLE No traumatic wounds, ecchymosis, or rash  TTP hip, knee  No knee or ankle effusion  Knee stable to varus/ valgus and anterior/posterior stress grossly  Sens DPN, SPN, TN grossly intact  Motor EHL, ext, flex, evers 5/5  DP 2+, PT 1+, No significant edema  Neurological: She is alert.  Skin: Skin is warm and dry. She is not diaphoretic.  Psychiatric: She has a normal mood and affect. Her behavior is normal.    Assessment/Plan: Right hip fx -- Plan IMN tomorrow by Dr. Lyla Glassing. NPO after MN. Multiple medical problems including CHF, CAD, CKD, and dementia -- per IM    Lisette Abu, PA-C Orthopedic Surgery [phone] 04/20/2019, 9:34 AM

## 2019-04-20 NOTE — Progress Notes (Signed)
RT came into room to give patient a breathing treatment and pt refused. I was able to place a pulse ox on her finger and it picked up her O2 saturation between 87-88. RT tried placing oxygen on the patient and she refused. RN came in and tried and she refused to let her either. RT advised RN to call MD to find out next step due to the patients consistent refusal of care.

## 2019-04-20 NOTE — H&P (View-Only) (Signed)
Reason for Consult:Right hip fx Referring Physician: R Rettie [name] is an 83 y.o. female.  HPI: [place] [name] fell and was found shortly after sitting upright. She was having significant low back and right hip pain and could not get up. She was brought to the hospital where x-rays showed a right hip fx and orthopedic surgery was consulted. She has significant dementia and cannot contribute to history or much to exam.  Past Medical History:  Diagnosis Date  . Anxiety 07/08/2017  . Arthritis   . Carotid artery disease ([place])    a. s/p L CEA. b. followed by [facility].  . Carotid stenosis 10/19/2012  . Chronic diastolic CHF (congestive heart failure) ([place])   . CKD (chronic kidney disease), stage III ([place])   . Congestive heart disease ([place])   . COPD (chronic obstructive pulmonary disease) ([place])   . COPD GOLD IV 12/30/2014   PFTs 12/15/14 FEV1  0.57 (29%) ratio 54 p 17% resp to saba    . Cough 01/31/2016  . Dementia with behavioral disturbance ([place]) 07/08/2017  . Diverticulosis   . Dyspnea   . Essential hypertension 12/02/2014  . Fall at home Sept. 2013  . Gastroesophageal reflux disease 07/08/2017  . Hyperlipidemia   . Hypertension   . Hyponatremia 12/02/2014  . Hypotension 02/01/2017  . Internal hemorrhoid   . Memory disorder 10/24/2014  . Nausea and vomiting 11/30/2014  . Primary osteoarthritis involving multiple joints 07/08/2017  . Vitamin D deficiency 02/15/2015    Past Surgical History:  Procedure Laterality Date  . APPENDECTOMY    . BREAST REDUCTION SURGERY    . CAROTID ENDARTERECTOMY     left CEA  . CATARACT EXTRACTION Bilateral   . COLONOSCOPY  2015  . EYE SURGERY     cataracts removed, bilaterally   . skin cancer removal    . TONSILLECTOMY      Family History  Problem Relation Age of Onset  . Heart disease Father        Before age 20  . Hypertension Father   . Heart attack Father   . Cancer Mother 55       Brain  . Dementia Brother   . Cancer Maternal Aunt 61       breast  cancer  . Diabetes Maternal Aunt   . Heart failure Maternal Grandmother   . Diabetes Maternal Grandmother   . Stroke Neg Hx     Social History:  reports that she has been smoking cigarettes. She has a 5.00 pack-year smoking history. She has never used smokeless tobacco. She reports that she does not drink alcohol or use drugs.  Allergies:  Allergies  Allergen Reactions  . Lidocaine Anaphylaxis, Swelling, Rash and Other (See Comments)    Any of the " [facility] "  . Other Other (See Comments)    All drugs that end in "cane'-  novacane etc. Daughter states patient almost died when she had it when she was born    Medications: I have reviewed the patient's current medications.  Results for orders placed or performed during the hospital encounter of 04/20/19 (from the past 48 hour(s))  Type and screen [place]     Status: None   Collection Time: 04/20/19  4:53 AM  Result Value Ref Range   ABO/RH(D) O POS    Antibody Screen NEG    Sample Expiration      04/23/2019,2359 Performed at [facility], [place] [address]  [place], [place] [address]   Basic metabolic panel     Status: Abnormal   Collection Time: 04/20/19  4:56 AM  Result Value Ref Range   Sodium 142 135 - 145 mmol/L   Potassium 4.1 3.5 - 5.1 mmol/L   Chloride 106 98 - 111 mmol/L   CO2 27 22 - 32 mmol/L   Glucose, Bld 145 (H) 70 - 99 mg/dL   BUN 14 8 - 23 mg/dL   Creatinine, Ser 1.10 (H) 0.44 - 1.00 mg/dL   Calcium 9.0 8.9 - 10.3 mg/dL   GFR calc non Af Amer 47 (L) >60 mL/min   GFR calc Af Amer 55 (L) >60 mL/min   Anion gap 9 5 - 15    Comment: Performed at [facility] [address] [place], [place] [address]  CBC WITH DIFFERENTIAL     Status: Abnormal   Collection Time: 04/20/19  4:56 AM  Result Value Ref Range   WBC 10.7 (H) 4.0 - 10.5 K/uL   RBC 4.58 3.87 - 5.11 MIL/uL   Hemoglobin 13.6 12.0 - 15.0 g/dL   HCT 43.2 36.0 - 46.0 %   MCV 94.3 80.0 - 100.0 fL   MCH 29.7 26.0 -  34.0 pg   MCHC 31.5 30.0 - 36.0 g/dL   RDW 13.8 11.5 - 15.5 %   Platelets 234 150 - 400 K/uL   nRBC 0.0 0.0 - 0.2 %   Neutrophils Relative % 75 %   Neutro Abs 8.0 (H) 1.7 - 7.7 K/uL   Lymphocytes Relative 13 %   Lymphs Abs 1.4 0.7 - 4.0 K/uL   Monocytes Relative 9 %   Monocytes Absolute 1.0 0.1 - 1.0 K/uL   Eosinophils Relative 1 %   Eosinophils Absolute 0.1 0.0 - 0.5 K/uL   Basophils Relative 0 %   Basophils Absolute 0.0 0.0 - 0.1 K/uL   Immature Granulocytes 2 %   Abs Immature Granulocytes 0.16 (H) 0.00 - 0.07 K/uL    Comment: Performed at [facility], [address], [place], [place] [address]  Protime-INR     Status: None   Collection Time: 04/20/19  4:56 AM  Result Value Ref Range   Prothrombin Time 12.9 11.4 - 15.2 seconds   INR 1.0 0.8 - 1.2    Comment: (NOTE) INR goal varies based on device and disease states. Performed at [facility], [place] [address], [place], [place] [address]   SARS Coronavirus 2 (CEPHEID - Performed in [place] hospital lab), Hosp Order     Status: None   Collection Time: 04/20/19  5:34 AM  Result Value Ref Range   SARS Coronavirus 2 NEGATIVE NEGATIVE    Comment: (NOTE) If result is NEGATIVE SARS-CoV-2 target nucleic acids are NOT DETECTED. The SARS-CoV-2 RNA is generally detectable in upper and lower  respiratory specimens during the acute phase of infection. The lowest  concentration of SARS-CoV-2 viral copies this assay can detect is 250  copies / mL. A negative result does not preclude SARS-CoV-2 infection  and should not be used as the sole basis for treatment or other  patient management decisions.  A negative result may occur with  improper specimen collection / handling, submission of specimen other  than nasopharyngeal swab, presence of viral mutation(s) within the  areas targeted by this assay, and inadequate number of viral copies  (<250 copies / mL). A negative result must be combined with clinical  observations,  patient history, and epidemiological  information. If result is POSITIVE SARS-CoV-2 target nucleic acids are DETECTED. The SARS-CoV-2 RNA is generally detectable in upper and lower  respiratory specimens dur ing the acute phase of infection.  Positive  results are indicative of active infection with SARS-CoV-2.  Clinical  correlation with patient history and other diagnostic information is  necessary to determine patient infection status.  Positive results do  not rule out bacterial infection or co-infection with other viruses. If result is PRESUMPTIVE POSTIVE SARS-CoV-2 nucleic acids MAY BE PRESENT.   A presumptive positive result was obtained on the submitted specimen  and confirmed on repeat testing.  While 2019 novel coronavirus  (SARS-CoV-2) nucleic acids may be present in the submitted sample  additional confirmatory testing may be necessary for epidemiological  and / or clinical management purposes  to differentiate between  SARS-CoV-2 and other Sarbecovirus currently known to infect humans.  If clinically indicated additional testing with an alternate test  methodology [phone]) is advised. The SARS-CoV-2 RNA is generally  detectable in upper and lower respiratory sp ecimens during the acute  phase of infection. The expected result is Negative. Fact Sheet for Patients:  StrictlyIdeas.no Fact Sheet for Healthcare Providers: BankingDealers.co.za This test is not yet approved or cleared by the [place] FDA and has been authorized for detection and/or diagnosis of SARS-CoV-2 by FDA under an Emergency Use Authorization (EUA).  This EUA will remain in effect (meaning this test can be used) for the duration of the COVID-19 declaration under Section 564(b)(1) of the Act, 21 U.S.C. section 360bbb-3(b)(1), unless the authorization is terminated or revoked sooner. Performed at [facility], [place] [address] [place],  [place] [address]     Dg Chest 1 View  Result Date: 04/20/2019 CLINICAL DATA:  Fall EXAM: CHEST  1 VIEW COMPARISON:  02/02/2017 FINDINGS: Chronic cardiomegaly. Stable mediastinal contours. Interstitial coarsening that is stable. No visible acute fracture. There has been remote fracturing of the proximal right humerus. IMPRESSION: Stable, no evidence of acute cardiopulmonary disease. Electronically Signed   By: Monte Fantasia M.D.   On: 04/20/2019 05:41   Dg Tibia/fibula Right  Result Date: 04/20/2019 CLINICAL DATA:  Fall with leg pain EXAM: RIGHT TIBIA AND FIBULA - 2 VIEW COMPARISON:  None. FINDINGS: No evidence of acute fracture or dislocation. There is knee osteoarthritis with medial compartment joint narrowing and spurring. IMPRESSION: 1. No acute finding. 2. Knee osteoarthritis with medial compartment collapse. Electronically Signed   By: Monte Fantasia M.D.   On: 04/20/2019 05:43   Dg Hip Unilat With Pelvis 2-3 Views Right  Result Date: 04/20/2019 CLINICAL DATA:  Fall EXAM: DG HIP (WITH OR WITHOUT PELVIS) 2-3V RIGHT COMPARISON:  None. FINDINGS: Comminuted intertrochanteric right femur fracture with varus angulation and lesser trochanter retraction. Osteopenia. No hip dislocation. IMPRESSION: Displaced intertrochanteric femur fracture on the right. Electronically Signed   By: Monte Fantasia M.D.   On: 04/20/2019 05:42    Review of Systems  Unable to perform ROS: Dementia   Blood pressure (!) 151/81, pulse 95, temperature 98.3 F (36.8 C), temperature source Oral, resp. rate 19, height 5\' 2"  (1.575 m), weight 80.7 kg, SpO2 93 %. Physical Exam  Constitutional: She appears well-developed and well-nourished. No distress.  HENT:  Head: Normocephalic and atraumatic.  Eyes: Conjunctivae are normal. Right eye exhibits no discharge. Left eye exhibits no discharge. No scleral icterus.  Neck: Normal range of motion.  Cardiovascular: Normal rate and regular rhythm.  Respiratory: Effort normal. No  respiratory distress.  Musculoskeletal:  Comments: RLE No traumatic wounds, ecchymosis, or rash  TTP hip, knee  No knee or ankle effusion  Knee stable to varus/ valgus and anterior/posterior stress grossly  Sens DPN, SPN, TN grossly intact  Motor EHL, ext, flex, evers 5/5  DP 2+, PT 1+, No significant edema  Neurological: She is alert.  Skin: Skin is warm and dry. She is not diaphoretic.  Psychiatric: She has a normal mood and affect. Her behavior is normal.    Assessment/Plan: Right hip fx -- Plan IMN tomorrow by Dr. Lyla Glassing. NPO after MN. Multiple medical problems including CHF, CAD, CKD, and dementia -- per IM    Lisette Abu, PA-C Orthopedic Surgery [phone] 04/20/2019, 9:34 AM

## 2019-04-20 NOTE — ED Notes (Signed)
ED TO INPATIENT HANDOFF REPORT  ED Nurse Name and Phone #: [phone] [name] Name/Age/Gender Virginia [name] 83 y.o. female Room/Bed: 019C/019C  Code Status   Code Status: Prior  Home/SNF/Other Home Patient oriented to: self and place Is this baseline? Yes   Triage Complete: Triage complete  Chief Complaint fall   Triage Note Per EMS pt with a hx of dementia fell while ambulating. Family reports finding patient on the floor sitting upright. EMS reports no LOC. Pt complains of lower back pain and right hip pain. Pt has shortening and lateral rotation of right leg. Pt received 182mcg fentanyl en route.    Allergies Allergies  Allergen Reactions  . Lidocaine Anaphylaxis, Swelling, Rash and Other (See Comments)    Any of the " [facility] "  . Other Other (See Comments)    All drugs that end in "cane'-  novacane etc. Daughter states patient almost died when she had it when she was born    Level of Care/Admitting Diagnosis ED Disposition    ED Disposition Condition [place]: [place] [[id number]]  Level of Care: Med-Surg [16]  Covid Evaluation: Screening Protocol (No Symptoms)  Diagnosis: Closed right hip fracture, initial encounter [facility]) [[id number]]  Admitting Physician: Vianne Bulls [[id number]]  Attending Physician: Vianne Bulls [[id number]]  Estimated length of stay: past midnight tomorrow  Certification:: I certify this patient will need inpatient services for at least 2 midnights  PT Class (Do Not Modify): Inpatient [101]  PT Acc Code (Do Not Modify): Private [1]       B Medical/Surgery History Past Medical History:  Diagnosis Date  . Anxiety 07/08/2017  . Arthritis   . Carotid artery disease ([place])    a. s/p L CEA. b. followed by [facility].  . Carotid stenosis 10/19/2012  . Chronic diastolic CHF (congestive heart failure) ([place])   . CKD (chronic kidney disease), stage III ([place])   . Congestive heart disease ([place])   . COPD (chronic  obstructive pulmonary disease) ([place])   . COPD GOLD IV 12/30/2014   PFTs 12/15/14 FEV1  0.57 (29%) ratio 54 p 17% resp to saba    . Cough 01/31/2016  . Dementia with behavioral disturbance ([place]) 07/08/2017  . Diverticulosis   . Dyspnea   . Essential hypertension 12/02/2014  . Fall at home Sept. 2013  . Gastroesophageal reflux disease 07/08/2017  . Hyperlipidemia   . Hypertension   . Hyponatremia 12/02/2014  . Hypotension 02/01/2017  . Internal hemorrhoid   . Memory disorder 10/24/2014  . Nausea and vomiting 11/30/2014  . Primary osteoarthritis involving multiple joints 07/08/2017  . Vitamin D deficiency 02/15/2015   Past Surgical History:  Procedure Laterality Date  . APPENDECTOMY    . BREAST REDUCTION SURGERY    . CAROTID ENDARTERECTOMY     left CEA  . CATARACT EXTRACTION Bilateral   . COLONOSCOPY  2015  . EYE SURGERY     cataracts removed, bilaterally   . skin cancer removal    . TONSILLECTOMY       A IV Location/Drains/Wounds Patient Lines/Drains/Airways Status   Active Line/Drains/Airways    Name:   Placement date:   Placement time:   Site:   Days:   Peripheral IV 04/20/19 Right Hand   04/20/19    0414    Hand   less than 1          Intake/Output Last 24 hours No intake or output  data in the 24 hours ending 04/20/19 1610  Labs/Imaging Results for orders placed or performed during the hospital encounter of 04/20/19 (from the past 48 hour(s))  Type and screen [place]     Status: None   Collection Time: 04/20/19  4:53 AM  Result Value Ref Range   ABO/RH(D) O POS    Antibody Screen NEG    Sample Expiration      04/23/2019,2359 Performed at [facility], [place] [address] [place], [place] [address]   Basic metabolic panel     Status: Abnormal   Collection Time: 04/20/19  4:56 AM  Result Value Ref Range   Sodium 142 135 - 145 mmol/L   Potassium 4.1 3.5 - 5.1 mmol/L   Chloride 106 98 - 111 mmol/L   CO2 27 22 - 32 mmol/L   Glucose, Bld 145 (H) 70 -  99 mg/dL   BUN 14 8 - 23 mg/dL   Creatinine, Ser 1.10 (H) 0.44 - 1.00 mg/dL   Calcium 9.0 8.9 - 10.3 mg/dL   GFR calc non Af Amer 47 (L) >60 mL/min   GFR calc Af Amer 55 (L) >60 mL/min   Anion gap 9 5 - 15    Comment: Performed at [facility] [address], [place], [place] [address]  CBC WITH DIFFERENTIAL     Status: Abnormal   Collection Time: 04/20/19  4:56 AM  Result Value Ref Range   WBC 10.7 (H) 4.0 - 10.5 K/uL   RBC 4.58 3.87 - 5.11 MIL/uL   Hemoglobin 13.6 12.0 - 15.0 g/dL   HCT 43.2 36.0 - 46.0 %   MCV 94.3 80.0 - 100.0 fL   MCH 29.7 26.0 - 34.0 pg   MCHC 31.5 30.0 - 36.0 g/dL   RDW 13.8 11.5 - 15.5 %   Platelets 234 150 - 400 K/uL   nRBC 0.0 0.0 - 0.2 %   Neutrophils Relative % 75 %   Neutro Abs 8.0 (H) 1.7 - 7.7 K/uL   Lymphocytes Relative 13 %   Lymphs Abs 1.4 0.7 - 4.0 K/uL   Monocytes Relative 9 %   Monocytes Absolute 1.0 0.1 - 1.0 K/uL   Eosinophils Relative 1 %   Eosinophils Absolute 0.1 0.0 - 0.5 K/uL   Basophils Relative 0 %   Basophils Absolute 0.0 0.0 - 0.1 K/uL   Immature Granulocytes 2 %   Abs Immature Granulocytes 0.16 (H) 0.00 - 0.07 K/uL    Comment: Performed at [facility], [address], [place], [place] [address]  Protime-INR     Status: None   Collection Time: 04/20/19  4:56 AM  Result Value Ref Range   Prothrombin Time 12.9 11.4 - 15.2 seconds   INR 1.0 0.8 - 1.2    Comment: (NOTE) INR goal varies based on device and disease states. Performed at [facility], [place] [address], [place], [place] [address]    Dg Chest 1 View  Result Date: 04/20/2019 CLINICAL DATA:  Fall EXAM: CHEST  1 VIEW COMPARISON:  02/02/2017 FINDINGS: Chronic cardiomegaly. Stable mediastinal contours. Interstitial coarsening that is stable. No visible acute fracture. There has been remote fracturing of the proximal right humerus. IMPRESSION: Stable, no evidence of acute cardiopulmonary disease. Electronically Signed   By: Monte Fantasia M.D.   On:  04/20/2019 05:41   Dg Tibia/fibula Right  Result Date: 04/20/2019 CLINICAL DATA:  Fall with leg pain EXAM: RIGHT TIBIA AND FIBULA -  2 VIEW COMPARISON:  None. FINDINGS: No evidence of acute fracture or dislocation. There is knee osteoarthritis with medial compartment joint narrowing and spurring. IMPRESSION: 1. No acute finding. 2. Knee osteoarthritis with medial compartment collapse. Electronically Signed   By: Monte Fantasia M.D.   On: 04/20/2019 05:43   Dg Hip Unilat With Pelvis 2-3 Views Right  Result Date: 04/20/2019 CLINICAL DATA:  Fall EXAM: DG HIP (WITH OR WITHOUT PELVIS) 2-3V RIGHT COMPARISON:  None. FINDINGS: Comminuted intertrochanteric right femur fracture with varus angulation and lesser trochanter retraction. Osteopenia. No hip dislocation. IMPRESSION: Displaced intertrochanteric femur fracture on the right. Electronically Signed   By: Monte Fantasia M.D.   On: 04/20/2019 05:42    Pending Labs Unresulted Labs (From admission, onward)    Start     Ordered   04/20/19 0519  SARS Coronavirus 2 (CEPHEID - Performed in [place] hospital lab), Hosp Order  (Asymptomatic Patients Labs)  Once,   R    Question:  Rule Out  Answer:  Yes   04/20/19 0518          Vitals/Pain Today's Vitals   04/20/19 0421 04/20/19 0443 04/20/19 0540 04/20/19 0620  BP: (!) 187/87  (!) 184/74 (!) 159/78  Pulse: 77  88 78  Resp: 17  18 14   Temp:      TempSrc:      SpO2: 98%  100% 100%  Weight:  80.7 kg    Height:  5\' 2"  (1.575 m)      Isolation Precautions No active isolations  Medications Medications  fentaNYL (SUBLIMAZE) injection 50 mcg (has no administration in time range)  fentaNYL (SUBLIMAZE) injection 50 mcg (50 mcg Intravenous Given 04/20/19 0529)  ondansetron (ZOFRAN) injection 4 mg (4 mg Intravenous Given 04/20/19 0430)    Mobility non-ambulatory High fall risk   Focused Assessments Shortening and rotation of right leg.   R Recommendations: See Admitting Provider  Note  Report given to:   Additional Notes: Pt has hx of dementia.

## 2019-04-20 NOTE — Progress Notes (Signed)
Patient arrived on the floor and not allowing staff to do anything with her. Brother at bedside and trying to talk with the patient at this time,pain med offered but refused . Will give patient little time to adjust.

## 2019-04-20 NOTE — Progress Notes (Signed)
Daughter [name] updated about the patients status.

## 2019-04-20 NOTE — Progress Notes (Addendum)
Ortho update plan of care.  Will plan for operative fixation of right hip fracture.  This will be tomorrow at [facility] with my partner Dr. Rod Can.  [place] for diet today with NPO order tonight at [facility].   Full consult note to follow.

## 2019-04-20 NOTE — Social Work (Signed)
CSW acknowledging consult for SNF placement. Will follow for therapy recommendations.   Hiroki Wint, MSW, LCSWA [place] Clinical Social Work [phone]   

## 2019-04-20 NOTE — ED Provider Notes (Signed)
[facility] EMERGENCY DEPARTMENT Provider Note   CSN: [id number] Arrival date & time: 04/20/19  0402    History   Chief Complaint Chief Complaint  Patient presents with   Fall   Level 5 caveat due to dementia HPI Virginia [name] is a 83 y.o. female.     The history is provided by the patient, a relative and the EMS personnel. The history is limited by the condition of the patient.  Fall  This is a new problem. Episode onset: just prior to arrival. The problem occurs constantly. The problem has been gradually worsening. Pertinent negatives include no chest pain, no abdominal pain and no headaches. The symptoms are aggravated by walking. The symptoms are relieved by rest.  Patient with history of CHF, CAD, CKD, dementia presents after fall.  Patient was trying to walk tonight when she fell and was found on the floor sitting upright.  She denies a head injury or LOC.  Family does not think she lost consciousness.  They checked on her immediately after the fall and she was awake and alert.  It is noted the patient has pain in her low back as well as right hip. Patient Stays at home with her son and brother  Past Medical History:  Diagnosis Date   Anxiety 07/08/2017   Arthritis    Carotid artery disease ([place])    a. s/p L CEA. b. followed by [facility].   Carotid stenosis 10/19/2012   Chronic diastolic CHF (congestive heart failure) ([place])    CKD (chronic kidney disease), stage III ([facility])    Congestive heart disease ([place])    COPD (chronic obstructive pulmonary disease) ([place])    COPD GOLD IV 12/30/2014   PFTs 12/15/14 FEV1  0.57 (29%) ratio 54 p 17% resp to saba     Cough 01/31/2016   Dementia with behavioral disturbance ([place]) 07/08/2017   Diverticulosis    Dyspnea    Essential hypertension 12/02/2014   Fall at home Sept. 2013   Gastroesophageal reflux disease 07/08/2017   Hyperlipidemia    Hypertension    Hyponatremia 12/02/2014   Hypotension 02/01/2017    Internal hemorrhoid    Memory disorder 10/24/2014   Nausea and vomiting 11/30/2014   Primary osteoarthritis involving multiple joints 07/08/2017   Vitamin D deficiency 02/15/2015    Patient Active Problem List   Diagnosis Date Noted   Primary osteoarthritis involving multiple joints 07/08/2017   Dementia with behavioral disturbance ([place]) 07/08/2017   Anxiety 07/08/2017   Gastroesophageal reflux disease 07/08/2017   Hypotension 02/01/2017   CKD (chronic kidney disease), stage III ([place]) 02/01/2017   Cough 01/31/2016   Vitamin D deficiency 02/15/2015   Hyperlipidemia 01/18/2015   COPD GOLD IV 12/30/2014   Chronic diastolic CHF (congestive heart failure) ([place]) 12/08/2014   Dyspnea    Hyponatremia 12/02/2014   Essential hypertension 12/02/2014   Nausea and vomiting 11/30/2014   Memory disorder 10/24/2014   Carotid artery disease ([place]) 10/19/2012    Past Surgical History:  Procedure Laterality Date   APPENDECTOMY     BREAST REDUCTION SURGERY     CAROTID ENDARTERECTOMY     left CEA   CATARACT EXTRACTION Bilateral    COLONOSCOPY  2015   EYE SURGERY     cataracts removed, bilaterally    skin cancer removal     TONSILLECTOMY       OB History   No obstetric history on file.      Home Medications    Prior  to Admission medications   Medication Sig Start Date End Date Taking? Authorizing Provider  albuterol (PROVENTIL) (2.5 MG/3ML) 0.083% nebulizer solution Take 6 mLs (5 mg total) by nebulization every 4 (four) hours as needed for wheezing or shortness of breath. 02/03/17   Geradine Girt, DO  ARTIFICIAL TEAR OP Place 2 drops into both eyes daily.     [provider]  Carbinoxamine Maleate 4 MG TABS Take 1 tablet (4 mg total) by mouth every 8 (eight) hours as needed. 11/16/18   Valentina Shaggy, MD  Cholecalciferol (D3 SUPER STRENGTH) 2000 units CAPS Take 1 capsule (2,000 Units total) by mouth daily. 07/01/18   Reed, Tiffany L, DO    diclofenac sodium (VOLTAREN) 1 % GEL APPLY TO AFFECTED AREA TWICE A DAY IF NEEDED FOR SHOULDER AND KNEE PAIN 02/15/19   Lauree Chandler, NP  donepezil (ARICEPT) 10 MG tablet TAKE 1 TABLET BY MOUTH EVERY DAY 04/05/19   Suzzanne Cloud, NP  famotidine (PEPCID) 40 MG tablet Take 40 mg by mouth 2 (two) times daily.    [provider]  fluticasone (FLONASE) 50 MCG/ACT nasal spray USE 2 SPRAYS IN [facility] NOSTRIL DAILY 12/08/18   Bobbitt, [place] Muta, MD  fluticasone furoate-vilanterol (BREO ELLIPTA) 100-25 MCG/INH AEPB Inhale 1 puff into the lungs daily. 07/20/18   Lauree Chandler, NP  furosemide (LASIX) 20 MG tablet Take 1 tablet (20 mg total) by mouth daily. 12/07/18   Imogene Burn, PA-C  ipratropium (ATROVENT) 0.06 % nasal spray USE 2 SPRAYS IN EACH NOSTRIL TWICE DAILY 12/08/18   Lauree Chandler, NP  ipratropium-albuterol (DUONEB) 0.5-2.5 (3) MG/3ML SOLN inhale contents of 1 vial in nebulizer every 6 hours 07/07/17   Gildardo Cranker, DO  memantine (NAMENDA) 10 MG tablet Take 1 tablet (10 mg total) by mouth 2 (two) times daily. 04/05/19   Suzzanne Cloud, NP  nebivolol (BYSTOLIC) 5 MG tablet TAKE 0.5 TABLET BY MOUTH EVERY DAY. HOLD A DOSE IF SYSTOLIC BLOOD PRESSURE LESS THAN 105. IF SYSTOLIC BLOOD PRESSURE OVER 150, GIVE 1 WHOLE 02/04/19   Fay Records, MD  ondansetron (ZOFRAN-ODT) 4 MG disintegrating tablet DISSOLVE 1 TABLET ON THE TONGUE IN THE MORNING 11/30/18   Pyrtle, Lajuan Lines, MD  pantoprazole (PROTONIX) 40 MG tablet TAKE 1 TABLET BY MOUTH ONCE DAILY 02/16/19   Lauree Chandler, NP  sertraline (ZOLOFT) 50 MG tablet 1 TABLET DAILY 02/07/19   Lauree Chandler, NP    Family History Family History  Problem Relation Age of Onset   Heart disease Father        Before age 79   Hypertension Father    Heart attack Father    Cancer Mother 52       Brain   Dementia Brother    Cancer Maternal Aunt 61       breast cancer   Diabetes Maternal Aunt    Heart failure Maternal Grandmother     Diabetes Maternal Grandmother    Stroke Neg Hx     Social History Social History   Tobacco Use   Smoking status: Current Some Day Smoker    Packs/day: 0.25    Years: 20.00    Pack years: 5.00    Types: Cigarettes    Last attempt to quit: 11/30/1994    Years since quitting: 24.4   Smokeless tobacco: Never Used   Tobacco comment: 4 or 5 cigarettes per day average  Substance Use Topics   Alcohol use: No  Alcohol/week: 0.0 standard drinks   Drug use: No     Allergies   Lidocaine and Other   Review of Systems Review of Systems  Unable to perform ROS: Dementia  Cardiovascular: Negative for chest pain.  Gastrointestinal: Negative for abdominal pain.  Neurological: Negative for headaches.     Physical Exam Updated Vital Signs BP (!) 159/78    Pulse 78    Temp 98.3 F (36.8 C) (Oral)    Resp 14    Ht 1.575 m (5\' 2" )    Wt 80.7 kg    SpO2 100%    BMI 32.56 kg/m   Physical Exam  CONSTITUTIONAL: elderly HEAD: Normocephalic/atraumatic EYES: EOMI/PERRL ENMT:   no signs of trauma NECK: supple no meningeal signs SPINE/BACK:entire spine nontender, No bruising/crepitance/stepoffs noted to spine CV: S1/S2 noted, no murmurs/rubs/gallops noted LUNGS: Lungs are clear to auscultation bilaterally, no apparent distress ABDOMEN: soft, nontender,no obvious bruising NEURO: Pt is awake/alert/appropriate, moves all extremitiesx4. She is mildly confused  EXTREMITIES: tenderness to palpation of right hip.  Right LE is shortened and rotated.  Tenderness/swelling to right tibia surface.  All other extremities/joints palpated/ranged and nontender Palpable pulse in left foot.  Dopplerable pulse to right foot SKIN: warm, color normal PSYCH: no abnormalities of mood noted   ED Treatments / Results  Labs (all labs ordered are listed, but only abnormal results are displayed) Labs Reviewed  BASIC METABOLIC PANEL - Abnormal; Notable for the following components:      Result Value    Glucose, Bld 145 (*)    Creatinine, Ser 1.10 (*)    GFR calc non Af Amer 47 (*)    GFR calc Af Amer 55 (*)    All other components within normal limits  CBC WITH DIFFERENTIAL/PLATELET - Abnormal; Notable for the following components:   WBC 10.7 (*)    Neutro Abs 8.0 (*)    Abs Immature Granulocytes 0.16 (*)    All other components within normal limits  SARS CORONAVIRUS 2 (HOSPITAL ORDER, [facility])  PROTIME-INR  TYPE AND SCREEN    EKG EKG Interpretation  Date/Time:  Wednesday Apr 20 2019 06:14:57 EDT Ventricular Rate:  79 PR Interval:    QRS Duration: 89 QT Interval:  388 QTC Calculation: 445 R Axis:   79 Text Interpretation:  Sinus rhythm Consider left atrial enlargement Confirmed by Ripley Fraise [phone]) on 04/20/2019 6:21:46 AM   Radiology Dg Chest 1 View  Result Date: 04/20/2019 CLINICAL DATA:  Fall EXAM: CHEST  1 VIEW COMPARISON:  02/02/2017 FINDINGS: Chronic cardiomegaly. Stable mediastinal contours. Interstitial coarsening that is stable. No visible acute fracture. There has been remote fracturing of the proximal right humerus. IMPRESSION: Stable, no evidence of acute cardiopulmonary disease. Electronically Signed   By: Monte Fantasia M.D.   On: 04/20/2019 05:41   Dg Tibia/fibula Right  Result Date: 04/20/2019 CLINICAL DATA:  Fall with leg pain EXAM: RIGHT TIBIA AND FIBULA - 2 VIEW COMPARISON:  None. FINDINGS: No evidence of acute fracture or dislocation. There is knee osteoarthritis with medial compartment joint narrowing and spurring. IMPRESSION: 1. No acute finding. 2. Knee osteoarthritis with medial compartment collapse. Electronically Signed   By: Monte Fantasia M.D.   On: 04/20/2019 05:43   Dg Hip Unilat With Pelvis 2-3 Views Right  Result Date: 04/20/2019 CLINICAL DATA:  Fall EXAM: DG HIP (WITH OR WITHOUT PELVIS) 2-3V RIGHT COMPARISON:  None. FINDINGS: Comminuted intertrochanteric right femur fracture with varus angulation and  lesser trochanter retraction.  Osteopenia. No hip dislocation. IMPRESSION: Displaced intertrochanteric femur fracture on the right. Electronically Signed   By: Monte Fantasia M.D.   On: 04/20/2019 05:42    Procedures Procedures    Medications Ordered in ED Medications  fentaNYL (SUBLIMAZE) injection 50 mcg (has no administration in time range)  fentaNYL (SUBLIMAZE) injection 50 mcg (50 mcg Intravenous Given 04/20/19 0529)  ondansetron (ZOFRAN) injection 4 mg (4 mg Intravenous Given 04/20/19 0430)     Initial Impression / Assessment and Plan / ED Course  I have reviewed the triage vital signs and the nursing notes.  Pertinent labs & imaging results that were available during my care of the patient were reviewed by me and considered in my medical decision making (see chart for details).        4:32 AM Pt with probable right hip fx but also may have right tib-fib fx 6:31 AM Patient found to have right intertrochanteric fracture.  No signs of any head or neck injury.  No signs of any chest abdominal trauma.  Imaging of right tib-fib is negative.  All extremities palpated and nontender and no deformities  Discussed the case with Dr. Victorino December with orthopedics.  He request admission to the medical service, plan for operative management on 5/21 Discussed case with Dr. Myna Hidalgo with [facility] hospitalist for admission. Updated patient, son, brother. They agree with plan. Son reports that patient is extremely sensitive to pain medications including codeine.  She also has lidocaine allergies. Final Clinical Impressions(s) / ED Diagnoses   Final diagnoses:  Closed fracture of right hip, initial encounter [facility])  Closed displaced intertrochanteric fracture of right femur, initial encounter [facility])    ED Discharge Orders    None       Ripley Fraise, MD 04/20/19 [phone]

## 2019-04-20 NOTE — H&P (Signed)
History and Physical    [name] FTD:[id number] DOB: 08/01/1936 DOA: 04/20/2019  Referring MD/NP/PA: Mitzi Hansen, MD PCP: Lauree Chandler, NP  Patient coming from: Home via EMS  Chief Complaint: Fall  I have personally briefly reviewed patient's old medical records in [place]   HPI: Virginia [name] is a 83 y.o. female with medical history significant of CHF, CAD, left carotid artery stenosis s/p enterectomy, COPD, CKD stage III dementia, and anxiety; who presents after having a unwitnessed fall at home.  Patient has a history of dementia and history is obtained from her brother who is present at bedside.  Apparently around 2:30 AM this morning she was found outside her door complaining of pain in her back and right hip.  Unclear if the patient had any loss of consciousness normally patient is cared for by her son and  brother at home.  She utilizes a walker intermittently to ambulate, but appears not to have been using it this morning.  It was abnormal for her to get up in the middle of the night as she had normally done this.  Patient was significantly anxious and agitated on arrival which is unusual for her and brother attributed this to pain.  ED Course: Upon admission into the emergency department patient was noted to be afebrile, blood pressures 151/81-187/87, and O2 saturations 82% -100% on room air.  Labs revealed WBC 10.7, BUN 14, creatinine 1.1, glucose 145.  X-ray imaging revealed a displaced right intertrochanteric fracture.  Patient was given fentanyl for pain.  Orthopedics was consulted and plan on performing surgery on 5/21.  TR[facility] called to admit.  Review of Systems  Unable to perform ROS: Dementia  Musculoskeletal: Positive for back pain, falls and joint pain.  Psychiatric/Behavioral: Positive for memory loss.    Past Medical History:  Diagnosis Date  . Anxiety 07/08/2017  . Arthritis   . Carotid artery disease ([place])    a. s/p L CEA. b. followed by [facility].  .  Carotid stenosis 10/19/2012  . Chronic diastolic CHF (congestive heart failure) ([place])   . CKD (chronic kidney disease), stage III ([place])   . Congestive heart disease ([place])   . COPD (chronic obstructive pulmonary disease) ([place])   . COPD GOLD IV 12/30/2014   PFTs 12/15/14 FEV1  0.57 (29%) ratio 54 p 17% resp to saba    . Cough 01/31/2016  . Dementia with behavioral disturbance ([place]) 07/08/2017  . Diverticulosis   . Dyspnea   . Essential hypertension 12/02/2014  . Fall at home Sept. 2013  . Gastroesophageal reflux disease 07/08/2017  . Hyperlipidemia   . Hypertension   . Hyponatremia 12/02/2014  . Hypotension 02/01/2017  . Internal hemorrhoid   . Memory disorder 10/24/2014  . Nausea and vomiting 11/30/2014  . Primary osteoarthritis involving multiple joints 07/08/2017  . Vitamin D deficiency 02/15/2015    Past Surgical History:  Procedure Laterality Date  . APPENDECTOMY    . BREAST REDUCTION SURGERY    . CAROTID ENDARTERECTOMY     left CEA  . CATARACT EXTRACTION Bilateral   . COLONOSCOPY  2015  . EYE SURGERY     cataracts removed, bilaterally   . skin cancer removal    . TONSILLECTOMY       reports that she has been smoking cigarettes. She has a 5.00 pack-year smoking history. She has never used smokeless tobacco. She reports that she does not drink alcohol or use drugs.  Allergies  Allergen Reactions  .  Lidocaine Anaphylaxis, Swelling, Rash and Other (See Comments)    Any of the " [facility] "  . Other Other (See Comments)    All drugs that end in "cane'-  novacane etc. Daughter states patient almost died when she had it when she was born    Family History  Problem Relation Age of Onset  . Heart disease Father        Before age 71  . Hypertension Father   . Heart attack Father   . Cancer Mother 81       Brain  . Dementia Brother   . Cancer Maternal Aunt 61       breast cancer  . Diabetes Maternal Aunt   . Heart failure Maternal Grandmother   . Diabetes Maternal  Grandmother   . Stroke Neg Hx     Prior to Admission medications   Medication Sig Start Date End Date Taking? Authorizing Provider  albuterol (PROVENTIL) (2.5 MG/3ML) 0.083% nebulizer solution Take 6 mLs (5 mg total) by nebulization every 4 (four) hours as needed for wheezing or shortness of breath. 02/03/17  Yes Vann, Jessica U, DO  ARTIFICIAL TEAR OP Place 2 drops into both eyes daily.    Yes [provider]  Carbinoxamine Maleate 4 MG TABS Take 1 tablet (4 mg total) by mouth every 8 (eight) hours as needed. Patient taking differently: Take 4 mg by mouth every 8 (eight) hours as needed (for allergies).  11/16/18  Yes Valentina Shaggy, MD  Cholecalciferol (D3 SUPER STRENGTH) 2000 units CAPS Take 1 capsule (2,000 Units total) by mouth daily. 07/01/18  Yes Reed, Tiffany L, DO  diclofenac sodium (VOLTAREN) 1 % GEL APPLY TO AFFECTED AREA TWICE A DAY IF NEEDED FOR SHOULDER AND KNEE PAIN Patient taking differently: Apply 2 g topically 2 (two) times daily as needed (for shoulder and knee pain).  02/15/19  Yes Lauree Chandler, NP  donepezil (ARICEPT) 10 MG tablet TAKE 1 TABLET BY MOUTH EVERY DAY Patient taking differently: Take 10 mg by mouth daily.  04/05/19  Yes Suzzanne Cloud, NP  famotidine (PEPCID) 40 MG tablet Take 40 mg by mouth 2 (two) times daily.   Yes [provider]  fluticasone (FLONASE) 50 MCG/ACT nasal spray USE 2 SPRAYS IN EACH NOSTRIL DAILY Patient taking differently: Place 2 sprays into both nostrils daily.  12/08/18  Yes Bobbitt, Sedalia Muta, MD  fluticasone furoate-vilanterol (BREO ELLIPTA) 100-25 MCG/INH AEPB Inhale 1 puff into the lungs daily. 07/20/18  Yes Lauree Chandler, NP  furosemide (LASIX) 20 MG tablet Take 1 tablet (20 mg total) by mouth daily. 12/07/18  Yes Imogene Burn, PA-C  ipratropium (ATROVENT) 0.06 % nasal spray USE 2 SPRAYS IN EACH NOSTRIL TWICE DAILY Patient taking differently: Place 2 sprays into both nostrils 2 (two) times a day.  12/08/18   Yes Lauree Chandler, NP  ipratropium-albuterol (DUONEB) 0.5-2.5 (3) MG/3ML SOLN inhale contents of 1 vial in nebulizer every 6 hours Patient taking differently: Take 3 mLs by nebulization every 6 (six) hours.  07/07/17  Yes Eulas Post, Monica, DO  memantine (NAMENDA) 10 MG tablet Take 1 tablet (10 mg total) by mouth 2 (two) times daily. 04/05/19  Yes Suzzanne Cloud, NP  nebivolol (BYSTOLIC) 5 MG tablet TAKE 0.5 TABLET BY MOUTH EVERY DAY. HOLD A DOSE IF SYSTOLIC BLOOD PRESSURE LESS THAN 105. IF SYSTOLIC BLOOD PRESSURE OVER 150, GIVE 1 WHOLE Patient taking differently: Take 2.5 mg by mouth daily. HOLD A DOSE  IF SYSTOLIC BLOOD PRESSURE LESS THAN 105. IF SYSTOLIC BLOOD PRESSURE OVER 150, GIVE 1 WHOLE 02/04/19  Yes Fay Records, MD  ondansetron (ZOFRAN-ODT) 4 MG disintegrating tablet DISSOLVE 1 TABLET ON THE TONGUE IN THE MORNING Patient taking differently: Take 4 mg by mouth daily.  11/30/18  Yes Pyrtle, Lajuan Lines, MD  pantoprazole (PROTONIX) 40 MG tablet TAKE 1 TABLET BY MOUTH ONCE DAILY Patient taking differently: Take 40 mg by mouth daily.  02/16/19  Yes Lauree Chandler, NP  sertraline (ZOLOFT) 50 MG tablet 1 TABLET DAILY Patient taking differently: Take 50 mg by mouth daily.  02/07/19  Yes Lauree Chandler, NP    Physical Exam:  Constitutional: Obese elderly female currently resting Vitals:   04/20/19 0620 04/20/19 0700 04/20/19 0720 04/20/19 0807  BP: (!) 159/78 (!) 155/63 (!) 159/61 (!) 151/81  Pulse: 78 80 77 95  Resp: 14 19 14 19   Temp:      TempSrc:      SpO2: 100% 100% 100% 93%  Weight:      Height:       Eyes: PERRL, lids and conjunctivae normal ENMT: Mucous membranes are moist. Posterior pharynx clear of any exudate or lesions.  Neck: normal, supple, no masses, no thyromegaly Respiratory: clear to auscultation bilaterally, no wheezing, no crackles. Normal respiratory effort. No accessory muscle use.  Cardiovascular: Regular rate and rhythm, no murmurs / rubs / gallops. No extremity  edema. 2+ pedal pulses. No carotid bruits.  Abdomen: no tenderness, no masses palpated. No hepatosplenomegaly. Bowel sounds positive.  Musculoskeletal: no clubbing / cyanosis.  Right leg externally rotated and shortened.  Patient in significant pain even with movement of covers. Skin: no rashes, lesions, ulcers. No induration Neurologic: CN 2-12 grossly intact. Sensation intact, DTR normal. Strength 5/5 in all 4.  Psychiatric: Unable to assess.  Currently calm and resting.   Labs on Admission: I have personally reviewed following labs and imaging studies  CBC: Recent Labs  Lab 04/20/19 0456  WBC 10.7*  NEUTROABS 8.0*  HGB 13.6  HCT 43.2  MCV 94.3  PLT 268   Basic Metabolic Panel: Recent Labs  Lab 04/20/19 0456  NA 142  K 4.1  CL 106  CO2 27  GLUCOSE 145*  BUN 14  CREATININE 1.10*  CALCIUM 9.0   GFR: Estimated Creatinine Clearance: 39.4 mL/min (A) (by C-G formula based on SCr of 1.1 mg/dL (H)). Liver Function Tests: No results for input(s): AST, ALT, ALKPHOS, BILITOT, PROT, ALBUMIN in the last 168 hours. No results for input(s): LIPASE, AMYLASE in the last 168 hours. No results for input(s): AMMONIA in the last 168 hours. Coagulation Profile: Recent Labs  Lab 04/20/19 0456  INR 1.0   Cardiac Enzymes: No results for input(s): CKTOTAL, CKMB, CKMBINDEX, TROPONINI in the last 168 hours. BNP (last 3 results) Recent Labs    08/30/18 1423 09/27/18 1446  PROBNP 345 503   HbA1C: No results for input(s): HGBA1C in the last 72 hours. CBG: No results for input(s): GLUCAP in the last 168 hours. Lipid Profile: No results for input(s): CHOL, HDL, LDLCALC, TRIG, CHOLHDL, LDLDIRECT in the last 72 hours. Thyroid Function Tests: No results for input(s): TSH, T4TOTAL, FREET4, T3FREE, THYROIDAB in the last 72 hours. Anemia Panel: No results for input(s): VITAMINB12, FOLATE, FERRITIN, TIBC, IRON, RETICCTPCT in the last 72 hours. Urine analysis:    Component Value Date/Time    COLORURINE YELLOW 02/01/2017 1659   APPEARANCEUR HAZY (A) 02/01/2017 1659   LABSPEC 1.010 06/14/2018  [place] 5.5 06/14/2018 1537   GLUCOSEU NEGATIVE 06/14/2018 1537   HGBUR NEGATIVE 06/14/2018 1537   BILIRUBINUR NEGATIVE 06/14/2018 1537   BILIRUBINUR neg 12/29/2017 1439   [place] 06/14/2018 1537   PROTEINUR NEGATIVE 06/14/2018 1537   UROBILINOGEN 0.2 06/14/2018 1537   NITRITE NEGATIVE 06/14/2018 1537   LEUKOCYTESUR NEGATIVE 06/14/2018 1537   Sepsis Labs: Recent Results (from the past 240 hour(s))  SARS Coronavirus 2 (CEPHEID - Performed in [place] hospital lab), Hosp Order     Status: None   Collection Time: 04/20/19  5:34 AM  Result Value Ref Range Status   SARS Coronavirus 2 NEGATIVE NEGATIVE Final    Comment: (NOTE) If result is NEGATIVE SARS-CoV-2 target nucleic acids are NOT DETECTED. The SARS-CoV-2 RNA is generally detectable in upper and lower  respiratory specimens during the acute phase of infection. The lowest  concentration of SARS-CoV-2 viral copies this assay can detect is 250  copies / mL. A negative result does not preclude SARS-CoV-2 infection  and should not be used as the sole basis for treatment or other  patient management decisions.  A negative result may occur with  improper specimen collection / handling, submission of specimen other  than nasopharyngeal swab, presence of viral mutation(s) within the  areas targeted by this assay, and inadequate number of viral copies  (<250 copies / mL). A negative result must be combined with clinical  observations, patient history, and epidemiological information. If result is POSITIVE SARS-CoV-2 target nucleic acids are DETECTED. The SARS-CoV-2 RNA is generally detectable in upper and lower  respiratory specimens dur ing the acute phase of infection.  Positive  results are indicative of active infection with SARS-CoV-2.  Clinical  correlation with patient history and other diagnostic  information is  necessary to determine patient infection status.  Positive results do  not rule out bacterial infection or co-infection with other viruses. If result is PRESUMPTIVE POSTIVE SARS-CoV-2 nucleic acids MAY BE PRESENT.   A presumptive positive result was obtained on the submitted specimen  and confirmed on repeat testing.  While 2019 novel coronavirus  (SARS-CoV-2) nucleic acids may be present in the submitted sample  additional confirmatory testing may be necessary for epidemiological  and / or clinical management purposes  to differentiate between  SARS-CoV-2 and other Sarbecovirus currently known to infect humans.  If clinically indicated additional testing with an alternate test  methodology [phone]) is advised. The SARS-CoV-2 RNA is generally  detectable in upper and lower respiratory sp ecimens during the acute  phase of infection. The expected result is Negative. Fact Sheet for Patients:  StrictlyIdeas.no Fact Sheet for Healthcare Providers: BankingDealers.co.za This test is not yet approved or cleared by the [place] FDA and has been authorized for detection and/or diagnosis of SARS-CoV-2 by FDA under an Emergency Use Authorization (EUA).  This EUA will remain in effect (meaning this test can be used) for the duration of the COVID-19 declaration under Section 564(b)(1) of the Act, 21 U.S.C. section 360bbb-3(b)(1), unless the authorization is terminated or revoked sooner. Performed at [facility], [place] [address] [place], [place] [address]      Radiological Exams on Admission: Dg Chest 1 View  Result Date: 04/20/2019 CLINICAL DATA:  Fall EXAM: CHEST  1 VIEW COMPARISON:  02/02/2017 FINDINGS: Chronic cardiomegaly. Stable mediastinal contours. Interstitial coarsening that is stable. No visible acute fracture. There has been remote fracturing of the proximal right humerus. IMPRESSION: Stable, no evidence of  acute cardiopulmonary  disease. Electronically Signed   By: Monte Fantasia M.D.   On: 04/20/2019 05:41   Dg Tibia/fibula Right  Result Date: 04/20/2019 CLINICAL DATA:  Fall with leg pain EXAM: RIGHT TIBIA AND FIBULA - 2 VIEW COMPARISON:  None. FINDINGS: No evidence of acute fracture or dislocation. There is knee osteoarthritis with medial compartment joint narrowing and spurring. IMPRESSION: 1. No acute finding. 2. Knee osteoarthritis with medial compartment collapse. Electronically Signed   By: Monte Fantasia M.D.   On: 04/20/2019 05:43   Dg Hip Unilat With Pelvis 2-3 Views Right  Result Date: 04/20/2019 CLINICAL DATA:  Fall EXAM: DG HIP (WITH OR WITHOUT PELVIS) 2-3V RIGHT COMPARISON:  None. FINDINGS: Comminuted intertrochanteric right femur fracture with varus angulation and lesser trochanter retraction. Osteopenia. No hip dislocation. IMPRESSION: Displaced intertrochanteric femur fracture on the right. Electronically Signed   By: Monte Fantasia M.D.   On: 04/20/2019 05:42    EKG: Independently reviewed.  Sinus rhythm at 79 bpm with left atrial abnormality  Assessment/Plan Fall at home with closed right intertrochanteric fracture of right hip, initial encounter: Acute.  Patient had unwitnessed fall at home complaining of right hip pain.  X-rays revealed displaced intertrochanteric fracture on the right.  Orthopedics consulted plan for surgery in a.m. with Dr. Lyla Glassing. -Admit to a MedSurg bed -Hip fracture order set initiated -Hydrocodone/morphine as needed for moderate to severe pain -Appreciate orthopedics consultative services -N.p.o. after midnight  Leukocytosis: Acute.  WBC mildly elevated at 10.7.  Chest x-ray was otherwise clear and with suspect of a white blood cell count related with acute fracture. -Check urinalysis as abnormal for the patient get up at night -Recheck CBC in a.m.  Essential hypertension: Blood pressures elevated up to 187/87.  Suspect multifactorial including  acute pain. -Continue Bystolic and furosemide -Hydralazine IV as needed for elevated blood pressure  COPD, without acute exacerbation: Stable.  Chest x-ray revealing cardiomegaly without signs of edema or infiltrate. -Continue home regimen of Breo and Duonebs  CKD stage III: Creatinine 1.10 on admission.  Overall kidney function appears better than previous seen. -Continue to monitor  Dementia: Patient at baseline able to recognize family members. -Continue Namenda and donepezil  Anxiety: Patient was noted to be very anxious and agitated on arrival.  Normally takes Zoloft for anxiety. -Continue Zoloft -Add Ativan 0.5 mg IV as needed anxiety/agitation  GERD -Continue Pepcid and Protonix  DVT prophylaxis: lovenox  Code Status: Full Family Communication: Discussed plan of care with patient's brother who is present at bedside Disposition Plan: TBD Consults called: orthopedic surgery Admission status: inpatient  Norval Morton MD [facility] Pager [phone]   If 7PM-7AM, please contact night-coverage [email or web address] Password [email or web address]  04/20/2019, 9:30 AM

## 2019-04-21 ENCOUNTER — Inpatient Hospital Stay (HOSPITAL_COMMUNITY): Payer: Medicare Other

## 2019-04-21 ENCOUNTER — Inpatient Hospital Stay (HOSPITAL_COMMUNITY): Payer: Medicare Other | Admitting: Registered Nurse

## 2019-04-21 ENCOUNTER — Encounter (HOSPITAL_COMMUNITY)
Admission: EM | Disposition: A | Discharge status: Home Health | Payer: Self-pay | Source: Home / Self Care | Attending: Internal Medicine

## 2019-04-21 ENCOUNTER — Encounter (HOSPITAL_COMMUNITY): Payer: Self-pay | Admitting: *Deleted

## 2019-04-21 DIAGNOSIS — S72141A Displaced intertrochanteric fracture of right femur, initial encounter for closed fracture: Secondary | ICD-10-CM | POA: Diagnosis present

## 2019-04-21 HISTORY — PX: INTRAMEDULLARY (IM) NAIL INTERTROCHANTERIC: SHX5875

## 2019-04-21 LAB — BASIC METABOLIC PANEL
Anion gap: 11 (ref 5–15)
BUN: 12 mg/dL (ref 8–23)
CO2: 25 mmol/L (ref 22–32)
Calcium: 9 mg/dL (ref 8.9–10.3)
Chloride: 107 mmol/L (ref 98–111)
Creatinine, Ser: 0.98 mg/dL (ref 0.44–1.00)
GFR calc Af Amer: >60 mL/min (ref >60)
GFR calc non Af Amer: 54 mL/min — ABNORMAL LOW (ref >60)
Glucose, Bld: 115 mg/dL — ABNORMAL HIGH (ref 70–99)
Potassium: 4.6 mmol/L (ref 3.5–5.1)
Sodium: 143 mmol/L (ref 135–145)

## 2019-04-21 LAB — URINALYSIS, ROUTINE W REFLEX MICROSCOPIC
Bilirubin Urine: NEGATIVE
Glucose, UA: NEGATIVE mg/dL
Hgb urine dipstick: NEGATIVE
Ketones, ur: 5 mg/dL — AB
Nitrite: POSITIVE — AB
Protein, ur: NEGATIVE mg/dL
Specific Gravity, Urine: 1.021 (ref 1.005–1.030)
pH: 5 (ref 5.0–8.0)

## 2019-04-21 LAB — CBC
HCT: 40 % (ref 36.0–46.0)
Hemoglobin: 12.3 g/dL (ref 12.0–15.0)
MCH: 29.6 pg (ref 26.0–34.0)
MCHC: 30.8 g/dL (ref 30.0–36.0)
MCV: 96.2 fL (ref 80.0–100.0)
Platelets: 185 10*3/uL (ref 150–400)
RBC: 4.16 MIL/uL (ref 3.87–5.11)
RDW: 14 % (ref 11.5–15.5)
WBC: 10.4 10*3/uL (ref 4.0–10.5)
nRBC: 0 % (ref 0.0–0.2)

## 2019-04-21 SURGERY — FIXATION, FRACTURE, INTERTROCHANTERIC, WITH INTRAMEDULLARY ROD
Anesthesia: General | Site: Hip | Laterality: Right

## 2019-04-21 MED ORDER — ENSURE PRE-SURGERY PO LIQD
296.0000 mL | Freq: Once | ORAL | Status: AC
  Administered 2019-04-21: 04:00:00 296 mL via ORAL
  Filled 2019-04-21: qty 296

## 2019-04-21 MED ORDER — PROPOFOL 10 MG/ML IV BOLUS
INTRAVENOUS | Status: AC
  Filled 2019-04-21: qty 20

## 2019-04-21 MED ORDER — SODIUM CHLORIDE 0.9 % IV SOLN
INTRAVENOUS | Status: DC
  Administered 2019-04-21: 1000 mL via INTRAVENOUS

## 2019-04-21 MED ORDER — PHENOL 1.4 % MT LIQD: 1.0000 | OROMUCOSAL | Status: DC | PRN

## 2019-04-21 MED ORDER — FENTANYL CITRATE (PF) 100 MCG/2ML IJ SOLN
INTRAMUSCULAR | Status: DC | PRN
  Administered 2019-04-21: 25 ug via INTRAVENOUS
  Administered 2019-04-21: 50 ug via INTRAVENOUS
  Administered 2019-04-21: 75 ug via INTRAVENOUS
  Administered 2019-04-21 (×2): 50 ug via INTRAVENOUS

## 2019-04-21 MED ORDER — CEFAZOLIN SODIUM-DEXTROSE 1-4 GM/50ML-% IV SOLN
INTRAVENOUS | Status: DC | PRN
  Administered 2019-04-21: 1 g via INTRAVENOUS

## 2019-04-21 MED ORDER — LACTATED RINGERS IV SOLN
INTRAVENOUS | Status: DC | PRN
  Administered 2019-04-21: 11:00:00 via INTRAVENOUS

## 2019-04-21 MED ORDER — OXYCODONE HCL 5 MG/5ML PO SOLN: 5.0000 mg | Freq: Once | ORAL | Status: DC | PRN

## 2019-04-21 MED ORDER — LABETALOL HCL 5 MG/ML IV SOLN
INTRAVENOUS | Status: DC | PRN
  Administered 2019-04-21: 10 mg via INTRAVENOUS

## 2019-04-21 MED ORDER — SODIUM CHLORIDE (PF) 0.9 % IJ SOLN
INTRAMUSCULAR | Status: AC
  Filled 2019-04-21: qty 10

## 2019-04-21 MED ORDER — ONDANSETRON HCL 4 MG/2ML IJ SOLN
INTRAMUSCULAR | Status: DC | PRN
  Administered 2019-04-21: 4 mg via INTRAVENOUS

## 2019-04-21 MED ORDER — OXYCODONE HCL 5 MG PO TABS: 5.0000 mg | ORAL_TABLET | Freq: Once | ORAL | Status: DC | PRN

## 2019-04-21 MED ORDER — SUCCINYLCHOLINE CHLORIDE 200 MG/10ML IV SOSY
PREFILLED_SYRINGE | INTRAVENOUS | Status: AC
  Filled 2019-04-21: qty 10

## 2019-04-21 MED ORDER — PROPOFOL 10 MG/ML IV BOLUS
INTRAVENOUS | Status: DC | PRN
  Administered 2019-04-21: 100 mg via INTRAVENOUS

## 2019-04-21 MED ORDER — CEFAZOLIN SODIUM 1 G IJ SOLR
INTRAMUSCULAR | Status: AC
  Filled 2019-04-21: qty 10

## 2019-04-21 MED ORDER — FENTANYL CITRATE (PF) 250 MCG/5ML IJ SOLN
INTRAMUSCULAR | Status: AC
  Filled 2019-04-21: qty 5

## 2019-04-21 MED ORDER — SODIUM CHLORIDE 0.9 % IV SOLN
INTRAVENOUS | Status: DC | PRN
  Administered 2019-04-21: 25 ug/min via INTRAVENOUS

## 2019-04-21 MED ORDER — DEXAMETHASONE SODIUM PHOSPHATE 10 MG/ML IJ SOLN
INTRAMUSCULAR | Status: DC | PRN
  Administered 2019-04-21: 5 mg via INTRAVENOUS

## 2019-04-21 MED ORDER — METHOCARBAMOL 1000 MG/10ML IJ SOLN
500.0000 mg | Freq: Four times a day (QID) | INTRAVENOUS | Status: DC | PRN
  Filled 2019-04-21: qty 5

## 2019-04-21 MED ORDER — CEFAZOLIN SODIUM-DEXTROSE 2-4 GM/100ML-% IV SOLN
2.0000 g | Freq: Four times a day (QID) | INTRAVENOUS | Status: AC
  Administered 2019-04-21 (×2): 2 g via INTRAVENOUS
  Filled 2019-04-21 (×2): qty 100

## 2019-04-21 MED ORDER — 0.9 % SODIUM CHLORIDE (POUR BTL) OPTIME
TOPICAL | Status: DC | PRN
  Administered 2019-04-21: 1000 mL

## 2019-04-21 MED ORDER — LIDOCAINE 2% (20 MG/ML) 5 ML SYRINGE
INTRAMUSCULAR | Status: AC
  Filled 2019-04-21: qty 5

## 2019-04-21 MED ORDER — DOCUSATE SODIUM 100 MG PO CAPS
100.0000 mg | ORAL_CAPSULE | Freq: Two times a day (BID) | ORAL | Status: DC
  Administered 2019-04-21 – 2019-04-27 (×9): 100 mg via ORAL
  Filled 2019-04-21 (×11): qty 1

## 2019-04-21 MED ORDER — ONDANSETRON HCL 4 MG/2ML IJ SOLN: 4.0000 mg | Freq: Once | INTRAMUSCULAR | Status: DC | PRN

## 2019-04-21 MED ORDER — METOCLOPRAMIDE HCL 5 MG PO TABS: 5.0000 mg | ORAL_TABLET | Freq: Three times a day (TID) | ORAL | Status: DC | PRN

## 2019-04-21 MED ORDER — METOCLOPRAMIDE HCL 5 MG/ML IJ SOLN: 5.0000 mg | Freq: Three times a day (TID) | INTRAMUSCULAR | Status: DC | PRN

## 2019-04-21 MED ORDER — DEXAMETHASONE SODIUM PHOSPHATE 10 MG/ML IJ SOLN
INTRAMUSCULAR | Status: AC
  Filled 2019-04-21: qty 1

## 2019-04-21 MED ORDER — MENTHOL 3 MG MT LOZG: 1.0000 | LOZENGE | OROMUCOSAL | Status: DC | PRN

## 2019-04-21 MED ORDER — ONDANSETRON HCL 4 MG/2ML IJ SOLN: 4.0000 mg | Freq: Four times a day (QID) | INTRAMUSCULAR | Status: DC | PRN

## 2019-04-21 MED ORDER — SUCCINYLCHOLINE CHLORIDE 200 MG/10ML IV SOSY
PREFILLED_SYRINGE | INTRAVENOUS | Status: DC | PRN
  Administered 2019-04-21: 120 mg via INTRAVENOUS

## 2019-04-21 MED ORDER — FENTANYL CITRATE (PF) 100 MCG/2ML IJ SOLN: 25.0000 ug | INTRAMUSCULAR | Status: DC | PRN

## 2019-04-21 MED ORDER — ONDANSETRON HCL 4 MG/2ML IJ SOLN
INTRAMUSCULAR | Status: AC
  Filled 2019-04-21: qty 2

## 2019-04-21 MED ORDER — LACTATED RINGERS IV SOLN: INTRAVENOUS | Status: DC | PRN

## 2019-04-21 MED ORDER — ENOXAPARIN SODIUM 40 MG/0.4ML ~~LOC~~ SOLN
40.0000 mg | SUBCUTANEOUS | Status: DC
  Administered 2019-04-22 – 2019-04-27 (×4): 40 mg via SUBCUTANEOUS
  Filled 2019-04-21 (×5): qty 0.4

## 2019-04-21 MED ORDER — ONDANSETRON HCL 4 MG PO TABS: 4.0000 mg | ORAL_TABLET | Freq: Four times a day (QID) | ORAL | Status: DC | PRN

## 2019-04-21 MED ORDER — EPHEDRINE SULFATE-NACL 50-0.9 MG/10ML-% IV SOSY
PREFILLED_SYRINGE | INTRAVENOUS | Status: DC | PRN
  Administered 2019-04-21 (×2): 5 mg via INTRAVENOUS

## 2019-04-21 MED ORDER — METHOCARBAMOL 500 MG PO TABS
500.0000 mg | ORAL_TABLET | Freq: Four times a day (QID) | ORAL | Status: DC | PRN
  Administered 2019-04-22: 21:00:00 500 mg via ORAL
  Filled 2019-04-21 (×2): qty 1

## 2019-04-21 SURGICAL SUPPLY — 51 items
ALCOHOL ISOPROPYL (RUBBING) (MISCELLANEOUS) ×3 IMPLANT
BIT DRILL 4.3MMS DISTAL GRDTED (BIT) IMPLANT
BNDG COHESIVE 4X5 TAN STRL (GAUZE/BANDAGES/DRESSINGS) ×2 IMPLANT
CHLORAPREP W/TINT 26ML (MISCELLANEOUS) ×3 IMPLANT
COVER PERINEAL POST (MISCELLANEOUS) ×3 IMPLANT
COVER SURGICAL LIGHT HANDLE (MISCELLANEOUS) ×3 IMPLANT
COVER WAND RF STERILE (DRAPES) ×1 IMPLANT
DERMABOND ADVANCED (GAUZE/BANDAGES/DRESSINGS) ×4
DERMABOND ADVANCED .7 DNX12 (GAUZE/BANDAGES/DRESSINGS) ×2 IMPLANT
DRAPE C-ARM 42X72 X-RAY (DRAPES) ×3 IMPLANT
DRAPE C-ARMOR (DRAPES) ×3 IMPLANT
DRAPE HALF SHEET 40X57 (DRAPES) ×3 IMPLANT
DRAPE IMP U-DRAPE 54X76 (DRAPES) ×6 IMPLANT
DRAPE STERI IOBAN 125X83 (DRAPES) ×3 IMPLANT
DRAPE U-SHAPE 47X51 STRL (DRAPES) ×6 IMPLANT
DRAPE UNIVERSAL PACK (DRAPES) ×3 IMPLANT
DRILL 4.3MMS DISTAL GRADUATED (BIT) ×3
DRSG MEPILEX BORDER 4X4 (GAUZE/BANDAGES/DRESSINGS) ×6 IMPLANT
DRSG MEPILEX BORDER 4X8 (GAUZE/BANDAGES/DRESSINGS) ×2 IMPLANT
DRSG PAD ABDOMINAL 8X10 ST (GAUZE/BANDAGES/DRESSINGS) IMPLANT
ELECT REM PT RETURN 9FT ADLT (ELECTROSURGICAL) ×3
ELECTRODE REM PT RTRN 9FT ADLT (ELECTROSURGICAL) ×1 IMPLANT
FACESHIELD WRAPAROUND (MASK) ×3 IMPLANT
FACESHIELD WRAPAROUND OR TEAM (MASK) ×1 IMPLANT
GLOVE BIO SURGEON STRL SZ8.5 (GLOVE) ×6 IMPLANT
GLOVE BIOGEL PI IND STRL 8.5 (GLOVE) ×1 IMPLANT
GLOVE BIOGEL PI INDICATOR 8.5 (GLOVE) ×2
GOWN STRL REUS W/ TWL LRG LVL3 (GOWN DISPOSABLE) ×2 IMPLANT
GOWN STRL REUS W/TWL 2XL LVL3 (GOWN DISPOSABLE) ×3 IMPLANT
GOWN STRL REUS W/TWL LRG LVL3 (GOWN DISPOSABLE) ×4
GUIDEPIN 3.2X17.5 THRD DISP (PIN) ×2 IMPLANT
GUIDEWIRE BALL NOSE 100CM (WIRE) ×2 IMPLANT
HFN RH 130 DEG 11MM X 360MM (Orthopedic Implant) ×2 IMPLANT
KIT TURNOVER KIT B (KITS) ×3 IMPLANT
MANIFOLD NEPTUNE II (INSTRUMENTS) ×3 IMPLANT
MARKER SKIN DUAL TIP RULER LAB (MISCELLANEOUS) ×3 IMPLANT
NS IRRIG 1000ML POUR BTL (IV SOLUTION) ×3 IMPLANT
PACK GENERAL/GYN (CUSTOM PROCEDURE TRAY) ×3 IMPLANT
PAD ARMBOARD 7.5X6 YLW CONV (MISCELLANEOUS) ×6 IMPLANT
PADDING CAST ABS 4INX4YD NS (CAST SUPPLIES)
PADDING CAST ABS COTTON 4X4 ST (CAST SUPPLIES) IMPLANT
SCREW BONE CORTICAL 5.0X3 (Screw) ×2 IMPLANT
SCREW LAG HIP NAIL 10.5X95 (Screw) ×2 IMPLANT
SUT MNCRL AB 3-0 PS2 27 (SUTURE) ×3 IMPLANT
SUT MON AB 2-0 CT1 27 (SUTURE) ×1 IMPLANT
SUT MON AB 2-0 CT1 36 (SUTURE) ×4 IMPLANT
SUT VIC AB 1 CT1 27 (SUTURE) ×2
SUT VIC AB 1 CT1 27XBRD ANBCTR (SUTURE) ×1 IMPLANT
TOWEL OR 17X24 6PK STRL BLUE (TOWEL DISPOSABLE) ×3 IMPLANT
TOWEL OR 17X26 10 PK STRL BLUE (TOWEL DISPOSABLE) ×3 IMPLANT
TUBE EXCHANGE NAIL HUMERAL (TRAUMA) ×2 IMPLANT

## 2019-04-21 NOTE — Anesthesia Preprocedure Evaluation (Signed)
Anesthesia Evaluation  Patient identified by MRN, date of birth, ID band  Reviewed: Allergy & Precautions, NPO status , Patient's Chart, lab work & pertinent test results  Airway Mallampati: II  TM Distance: >3 FB     Dental  (+) Teeth Intact, Dental Advisory Given   Pulmonary Current Smoker,     + decreased breath sounds      Cardiovascular hypertension,  Rhythm:Regular Rate:Normal     Neuro/Psych    GI/Hepatic   Endo/Other    Renal/GU      Musculoskeletal   Abdominal   Peds  Hematology   Anesthesia Other Findings   Reproductive/Obstetrics                             Anesthesia Physical Anesthesia Plan  ASA: III  Anesthesia Plan: General   Post-op Pain Management:    Induction: Intravenous  PONV Risk Score and Plan: Ondansetron and Dexamethasone  Airway Management Planned: Oral ETT  Additional Equipment:   Intra-op Plan:   Post-operative Plan: Extubation in OR  Informed Consent: I have reviewed the patients History and Physical, chart, labs and discussed the procedure including the risks, benefits and alternatives for the proposed anesthesia with the patient or authorized representative who has indicated his/her understanding and acceptance.     Dental advisory given  Plan Discussed with: CRNA and Anesthesiologist  Anesthesia Plan Comments:         Anesthesia Quick Evaluation

## 2019-04-21 NOTE — Social Work (Addendum)
CSW acknowledging consult for SNF placement. Will follow for therapy recommendations. Pt will also have to be willing to accept care to dc to SNF- aware brother at bedside here at hospital, currently no visitor restrictions in place at all SNFs.    Westley Hummer, MSW, [place] [facility] [phone]

## 2019-04-21 NOTE — Anesthesia Procedure Notes (Signed)
Procedure Name: Intubation Date/Time: 04/21/2019 10:53 AM Performed by: Trinna Post., CRNA Pre-anesthesia Checklist: Patient identified, Emergency Drugs available, Suction available and Patient being monitored Patient Re-evaluated:Patient Re-evaluated prior to induction Oxygen Delivery Method: Circle system utilized Preoxygenation: Pre-oxygenation with 100% oxygen Induction Type: IV induction and Rapid sequence Laryngoscope Size: Miller and 2 Grade View: Grade I Tube type: Oral Tube size: 7.0 mm Number of attempts: 1 Airway Equipment and Method: Stylet and Oral airway Placement Confirmation: ETT inserted through vocal cords under direct vision,  positive ETCO2 and breath sounds checked- equal and bilateral Secured at: 21 cm Tube secured with: Tape Dental Injury: Teeth and Oropharynx as per pre-operative assessment

## 2019-04-21 NOTE — Progress Notes (Signed)
Patient arrived to short stay with 22G IV.  Patient confused and per report has been refusing care.  She was getting agitated when I was flushing her IV.  IV patent and fluids run without complications.  Dr. Linna Caprice made aware.  He is ok with a 22G for surgery.

## 2019-04-21 NOTE — Interval H&P Note (Signed)
History and Physical Interval Note:  04/21/2019 10:16 AM  Virginia [name]  has presented today for surgery, with the diagnosis of right troch nail.  The various methods of treatment have been discussed with the patient and family. After consideration of risks, benefits and other options for treatment, the patient has consented to  Procedure(s): INTRAMEDULLARY (IM) NAIL INTERTROCHANTRIC (Right) as a surgical intervention.  The patient's history has been reviewed, patient examined, no change in status, stable for surgery.  I have reviewed the patient's chart and labs.  Questions were answered to the patient's satisfaction.    The risks, benefits, and alternatives were discussed with the patient. There are risks associated with the surgery including, but not limited to, problems with anesthesia (death), infection, differences in leg length/angulation/rotation, fracture of bones, loosening or failure of implants, malunion, nonunion, hematoma (blood accumulation) which may require surgical drainage, blood clots, pulmonary embolism, nerve injury (foot drop), and blood vessel injury. The patient understands these risks and elects to proceed.    Hilton Cork Becki Mccaskill

## 2019-04-21 NOTE — Progress Notes (Signed)
Patient refused CHG bath, vitals and blood draw.  This was the 2nd attempt by 2 different phlebotomists despite prodding by brother at bedside.  Another phlebotomist will attempt to draw blood later.

## 2019-04-21 NOTE — Progress Notes (Signed)
D/C purewick in SS prior to surgery.

## 2019-04-21 NOTE — Transfer of Care (Signed)
Immediate Anesthesia Transfer of Care Note  Patient: Virginia [name]  Procedure(s) Performed: INTRAMEDULLARY (IM) NAIL INTERTROCHANTRIC (Right Hip)  Patient Location: PACU  Anesthesia Type:General  Level of Consciousness: drowsy  Airway & Oxygen Therapy: Patient Spontanous Breathing and Patient connected to nasal cannula oxygen  Post-op Assessment: Report given to RN and Post -op Vital signs reviewed and stable  Post vital signs: Reviewed and stable  Last Vitals:  Vitals Value Taken Time  BP 140/73 04/21/2019  1:13 PM  Temp    Pulse 94 04/21/2019  1:12 PM  Resp 12 04/21/2019  1:13 PM  SpO2 96 % 04/21/2019  1:12 PM  Vitals shown include unvalidated device data.  Last Pain:  Vitals:   04/21/19 0250  TempSrc:   PainSc: Asleep      Patients Stated Pain Goal: (unable to obtain from patient-oriented x 1) (99/24/26 8341)  Complications: No apparent anesthesia complications

## 2019-04-21 NOTE — Anesthesia Postprocedure Evaluation (Signed)
Anesthesia Post Note  Patient: Virginia [name]  Procedure(s) Performed: INTRAMEDULLARY (IM) NAIL INTERTROCHANTRIC (Right Hip)     Patient location during evaluation: PACU Anesthesia Type: General Level of consciousness: confused Pain management: pain level controlled Vital Signs Assessment: post-procedure vital signs reviewed and stable Respiratory status: spontaneous breathing, nonlabored ventilation, respiratory function stable and patient connected to nasal cannula oxygen Cardiovascular status: blood pressure returned to baseline and stable Postop Assessment: no apparent nausea or vomiting Anesthetic complications: no    Last Vitals:  Vitals:   04/21/19 1407 04/21/19 1424  BP: (!) 157/72 (!) 155/81  Pulse: 96 91  Resp: 12 13  Temp: (!) 36.1 C (!) 36.3 C  SpO2: 96% 97%    Last Pain:  Vitals:   04/21/19 1424  TempSrc: Oral  PainSc:                  Adelle Zachar COKER

## 2019-04-21 NOTE — Op Note (Signed)
OPERATIVE REPORT  SURGEON: Rod Can, MD   ASSISTANT: Staff.  PREOPERATIVE DIAGNOSIS: Right reverse obliquity intertrochanteric femur fracture.   POSTOPERATIVE DIAGNOSIS: Right reverse obliquity intertrochanteric fracture.   PROCEDURE: Intramedullary fixation, Right femur.   IMPLANTS: Biomet Affixus Hip Fracture Nail, 11 by 360 mm, 130 degrees. 10.5 x 95 mm Hip Fracture Nail Lag Screw. 5 x 34 mm distal interlocking screw 1.  ANESTHESIA:  General  ESTIMATED BLOOD LOSS:-100 mL    ANTIBIOTICS: 2 g Ancef.  DRAINS: None.  COMPLICATIONS: None.   CONDITION: PACU - hemodynamically stable.   BRIEF CLINICAL NOTE: Virginia [name] is a 83 y.o. female who presented with an intertrochanteric femur fracture. The patient was admitted to the hospitalist service and underwent perioperative risk stratification and medical optimization. The risks, benefits, and alternatives to the procedure were explained, and the patient elected to proceed.  PROCEDURE IN DETAIL: Surgical site was marked by myself. The patient was taken to the operating room and anesthesia was induced on the bed. The patient was then transferred to the [facility] table and the nonoperative lower extremity was scissored underneath the operative side. The fracture was reduced with traction, internal rotation, and adduction. The hip was prepped and draped in the normal sterile surgical fashion. Timeout was called verifying side and site of surgery. Preop antibiotics were given with 60 minutes of beginning the procedure.  Fluoroscopy was used to define the patient's anatomy. A 4 cm incision was made just proximal to the tip of the greater trochanter. The awl was used to obtain the standard starting point for a trochanteric entry nail under fluoroscopic control. The guidepin was placed. The entry reamer was used to open the proximal femur.  I placed the guidewire to the level of the physeal scar of the knee. I measured the length of the  guidewire. Sequential reaming was performed up to a size 12.5 mm with excellent chatter. Therefore, a size 11 by 360 mm nail was selected and assembled to the jig on the back table. The nail was placed without any difficulty. Through a separate stab incision, a bone hook was placed to maintain reduction. The cannula was placed down to the bone in preparation for the cephalomedullary device. A guidepin was placed into the femoral head using AP and lateral fluoroscopy views. The pin was measured, and then reaming was performed to the appropriate depth. The lag screw was inserted to the appropriate depth. The fracture was compressed through the jig. The setscrew was tightened and then loosened one quarter turn. Using perfect circle technique, a distal interlocking screw was placed. The jig was removed. Final AP and lateral fluoroscopy views were obtained to confirm fracture reduction and hardware placement. Tip apex distance was appropriate. There was no chondral penetration.  The wounds were copiously irrigated with saline. The wound was closed in layers with #1 Vicryl for the fascia, 2-0 Monocryl for the deep dermal layer, and 3-0 Monocryl subcuticular stitch. Glue was applied to the skin. Once the glue was fully hardened, sterile dressing was applied. The patient was then awakened from anesthesia and taken to the PACU in stable condition. Sponge needle and instrument counts were correct at the end of the case 2. There were no known complications.  We will readmit the patient to the hospitalist. Weightbearing status will be weightbearing as tolerated with a walker. We will begin Lovenox for DVT prophylaxis. The patient will work with physical therapy and undergo disposition planning.

## 2019-04-21 NOTE — Progress Notes (Signed)
PROGRESS NOTE    Virginia [name]  CXK:[id number] DOB: May 14, 1936 DOA: 04/20/2019 PCP: Lauree Chandler, NP    Brief Narrative:  83 y.o. female with medical history significant of CHF, CAD, left carotid artery stenosis s/p enterectomy, COPD, CKD stage III dementia, and anxiety; who presents after having a unwitnessed fall at home.  Patient has a history of dementia and history is obtained from her brother who is present at bedside.  Apparently around 2:30 AM this morning she was found outside her door complaining of pain in her back and right hip.  Unclear if the patient had any loss of consciousness normally patient is cared for by her son and  brother at home.  She utilizes a walker intermittently to ambulate, but appears not to have been using it this morning.  It was abnormal for her to get up in the middle of the night as she had normally done this.  Patient was significantly anxious and agitated on arrival which is unusual for her and brother attributed this to pain  Assessment & Plan:   Principal Problem:   Closed intertrochanteric fracture of hip, right, initial encounter [facility]) Active Problems:   Essential hypertension   COPD GOLD IV   Dementia with behavioral disturbance (HCC)   Anxiety   Gastroesophageal reflux disease   Leukocytosis   Fall at home, initial encounter   Displaced intertrochanteric fracture of right femur ([place])  Fall at home with closed right intertrochanteric fracture of right hip, initial encounter: Acute.  Patient had unwitnessed fall at home complaining of right hip pain.  X-rays revealed displaced intertrochanteric fracture on the right.  -Orthopedic Surgery consulted. -Pt underwent surgery 5/21 -Continue with analgesics as needed  Leukocytosis: Acute.  WBC mildly elevated at 10.7.  Chest x-ray was otherwise clear and with suspect of a white blood cell count related with acute fracture. -UA pos for nitrites, leuk -.Afebrile -Cont to hold antimicrobial  unless pt becomes symptomatic or if febrile  Essential hypertension: Blood pressures elevated up to 187/87 at time of presentation.  Suspect multifactorial including acute pain. -Continue Bystolic and furosemide -Continue hydralazine IV as needed for elevated blood pressure -vitals reviewed. BP improved  COPD, without acute exacerbation: Stable.  Chest x-ray revealing cardiomegaly without signs of edema or infiltrate. -Continue home regimen of Breo and Duonebs -Had been on RA, currently on 4LNC post-op  CKD stage III: Creatinine 1.10 on admission.  Overall kidney function appears better than previous seen. -repeat bmet in AM  Dementia: Patient at baseline able to recognize family members. -Continue Namenda and donepezil  Anxiety: Patient was noted to be very anxious and agitated on arrival.  Normally takes Zoloft for anxiety. -Continue Zoloft -Add Ativan 0.5 mg IV as needed anxiety/agitation  GERD -Continue Pepcid and Protonix as tolerated   DVT prophylaxis: Lovenox subQ Code Status: Full Family Communication: Pt in room, family sitting at bedside Disposition Plan: Uncertain at this time, PT eval anticipated post-op  Consultants:   Orthopedic Surgery  Procedures:  Intramedullary fixation, Right femur - 5/21, Dr. Lyla Glassing  Antimicrobials: Anti-infectives (From admission, onward)   Start     Dose/Rate Route Frequency Ordered Stop   04/21/19 0600  ceFAZolin (ANCEF) IVPB 2g/100 mL premix     2 g 200 mL/hr over 30 Minutes Intravenous On call to O.R. 04/20/19 1827 04/21/19 9702       Subjective: Unable to assess. Pt asleep/still sedated  Objective: Vitals:   04/20/19 1411 04/21/19 0854 04/21/19 1313 04/21/19 1328  BP: (!) 175/77 (!) 178/92 140/73 130/67  Pulse: (!) 101 93 94 86  Resp: 20 16 16 15   Temp:   (!) 97 F (36.1 C)   TempSrc:      SpO2: 100% 100% 96% 92%  Weight:  80.7 kg    Height:  5\' 2"  (1.575 m)      Intake/Output Summary (Last 24 hours) at  04/21/2019 1347 Last data filed at 04/21/2019 1217 Gross per 24 hour  Intake 1636.65 ml  Output 640 ml  Net 996.65 ml   Filed Weights   04/20/19 0443 04/21/19 0854  Weight: 80.7 kg 80.7 kg    Examination:  General exam: Appears calm and comfortable  Respiratory system: Clear to auscultation. Respiratory effort normal. Cardiovascular system: S1 & S2 heard, RRR Gastrointestinal system: Abdomen is nondistended, soft and nontender. No organomegaly or masses felt. Normal bowel sounds heard. Central nervous system: No focal neurological deficits, no tremors Extremities: Symmetric 5 x 5 power. Skin: No rashes, lesions  Psychiatry: unable to assess, pt asleep/remains sedated post-op  Data Reviewed: I have personally reviewed following labs and imaging studies  CBC: Recent Labs  Lab 04/20/19 0456 04/21/19 0747  WBC 10.7* 10.4  NEUTROABS 8.0*  --   HGB 13.6 12.3  HCT 43.2 40.0  MCV 94.3 96.2  PLT 234 767   Basic Metabolic Panel: Recent Labs  Lab 04/20/19 0456 04/21/19 0747  NA 142 143  K 4.1 4.6  CL 106 107  CO2 27 25  GLUCOSE 145* 115*  BUN 14 12  CREATININE 1.10* 0.98  CALCIUM 9.0 9.0   GFR: Estimated Creatinine Clearance: 44.3 mL/min (by C-G formula based on SCr of 0.98 mg/dL). Liver Function Tests: No results for input(s): AST, ALT, ALKPHOS, BILITOT, PROT, ALBUMIN in the last 168 hours. No results for input(s): LIPASE, AMYLASE in the last 168 hours. No results for input(s): AMMONIA in the last 168 hours. Coagulation Profile: Recent Labs  Lab 04/20/19 0456  INR 1.0   Cardiac Enzymes: No results for input(s): CKTOTAL, CKMB, CKMBINDEX, TROPONINI in the last 168 hours. BNP (last 3 results) Recent Labs    08/30/18 1423 09/27/18 1446  PROBNP 345 503   HbA1C: No results for input(s): HGBA1C in the last 72 hours. CBG: No results for input(s): GLUCAP in the last 168 hours. Lipid Profile: No results for input(s): CHOL, HDL, LDLCALC, TRIG, CHOLHDL, LDLDIRECT  in the last 72 hours. Thyroid Function Tests: No results for input(s): TSH, T4TOTAL, FREET4, T3FREE, THYROIDAB in the last 72 hours. Anemia Panel: No results for input(s): VITAMINB12, FOLATE, FERRITIN, TIBC, IRON, RETICCTPCT in the last 72 hours. Sepsis Labs: No results for input(s): PROCALCITON, LATICACIDVEN in the last 168 hours.  Recent Results (from the past 240 hour(s))  SARS Coronavirus 2 (CEPHEID - Performed in [facility] hospital lab), Hosp Order     Status: None   Collection Time: 04/20/19  5:34 AM  Result Value Ref Range Status   SARS Coronavirus 2 NEGATIVE NEGATIVE Final    Comment: (NOTE) If result is NEGATIVE SARS-CoV-2 target nucleic acids are NOT DETECTED. The SARS-CoV-2 RNA is generally detectable in upper and lower  respiratory specimens during the acute phase of infection. The lowest  concentration of SARS-CoV-2 viral copies this assay can detect is 250  copies / mL. A negative result does not preclude SARS-CoV-2 infection  and should not be used as the sole basis for treatment or other  patient management decisions.  A negative result may occur with  improper specimen collection / handling, submission of specimen other  than nasopharyngeal swab, presence of viral mutation(s) within the  areas targeted by this assay, and inadequate number of viral copies  (<250 copies / mL). A negative result must be combined with clinical  observations, patient history, and epidemiological information. If result is POSITIVE SARS-CoV-2 target nucleic acids are DETECTED. The SARS-CoV-2 RNA is generally detectable in upper and lower  respiratory specimens dur ing the acute phase of infection.  Positive  results are indicative of active infection with SARS-CoV-2.  Clinical  correlation with patient history and other diagnostic information is  necessary to determine patient infection status.  Positive results do  not rule out bacterial infection or co-infection with other viruses.  If result is PRESUMPTIVE POSTIVE SARS-CoV-2 nucleic acids MAY BE PRESENT.   A presumptive positive result was obtained on the submitted specimen  and confirmed on repeat testing.  While 2019 novel coronavirus  (SARS-CoV-2) nucleic acids may be present in the submitted sample  additional confirmatory testing may be necessary for epidemiological  and / or clinical management purposes  to differentiate between  SARS-CoV-2 and other Sarbecovirus currently known to infect humans.  If clinically indicated additional testing with an alternate test  methodology [phone]) is advised. The SARS-CoV-2 RNA is generally  detectable in upper and lower respiratory sp ecimens during the acute  phase of infection. The expected result is Negative. Fact Sheet for Patients:  StrictlyIdeas.no Fact Sheet for Healthcare Providers: BankingDealers.co.za This test is not yet approved or cleared by the [place] FDA and has been authorized for detection and/or diagnosis of SARS-CoV-2 by FDA under an Emergency Use Authorization (EUA).  This EUA will remain in effect (meaning this test can be used) for the duration of the COVID-19 declaration under Section 564(b)(1) of the Act, 21 U.S.C. section 360bbb-3(b)(1), unless the authorization is terminated or revoked sooner. Performed at [facility], [place] [address], [place], [place] [address]   Surgical pcr screen     Status: None   Collection Time: 04/20/19 10:12 PM  Result Value Ref Range Status   MRSA, PCR NEGATIVE NEGATIVE Final   Staphylococcus aureus NEGATIVE NEGATIVE Final    Comment: (NOTE) The Xpert SA Assay (FDA approved for NASAL specimens in patients 52 years of age and older), is one component of a comprehensive surveillance program. It is not intended to diagnose infection nor to guide or monitor treatment. Performed at [facility], [place] [address], [place], [place] [address]       Radiology Studies: Dg Chest 1 View  Result Date: 04/20/2019 CLINICAL DATA:  Fall EXAM: CHEST  1 VIEW COMPARISON:  02/02/2017 FINDINGS: Chronic cardiomegaly. Stable mediastinal contours. Interstitial coarsening that is stable. No visible acute fracture. There has been remote fracturing of the proximal right humerus. IMPRESSION: Stable, no evidence of acute cardiopulmonary disease. Electronically Signed   By: Monte Fantasia M.D.   On: 04/20/2019 05:41   Dg Tibia/fibula Right  Result Date: 04/20/2019 CLINICAL DATA:  Fall with leg pain EXAM: RIGHT TIBIA AND FIBULA - 2 VIEW COMPARISON:  None. FINDINGS: No evidence of acute fracture or dislocation. There is knee osteoarthritis with medial compartment joint narrowing and spurring. IMPRESSION: 1. No acute finding. 2. Knee osteoarthritis with medial compartment collapse. Electronically Signed   By: Monte Fantasia M.D.   On: 04/20/2019 05:43   Dg C-arm 1-60 Min  Result Date: 04/21/2019 CLINICAL DATA:  Postop. EXAM: DG C-ARM 61-120 MIN; RIGHT FEMUR 2  VIEWS COMPARISON:  Plain films 04/20/2019 FINDINGS: Intraoperative spot films demonstrate intramedullary rod and screw placement across an intertrochanteric fracture. IMPRESSION: As above. Improved position and alignment. Electronically Signed   By: Staci Righter M.D.   On: 04/21/2019 12:35   Dg Hip Unilat With Pelvis 2-3 Views Right  Result Date: 04/20/2019 CLINICAL DATA:  Fall EXAM: DG HIP (WITH OR WITHOUT PELVIS) 2-3V RIGHT COMPARISON:  None. FINDINGS: Comminuted intertrochanteric right femur fracture with varus angulation and lesser trochanter retraction. Osteopenia. No hip dislocation. IMPRESSION: Displaced intertrochanteric femur fracture on the right. Electronically Signed   By: Monte Fantasia M.D.   On: 04/20/2019 05:42   Dg Femur, Min 2 Views Right  Result Date: 04/21/2019 CLINICAL DATA:  Postop. EXAM: DG C-ARM 61-120 MIN; RIGHT FEMUR 2 VIEWS COMPARISON:  Plain films 04/20/2019 FINDINGS:  Intraoperative spot films demonstrate intramedullary rod and screw placement across an intertrochanteric fracture. IMPRESSION: As above. Improved position and alignment. Electronically Signed   By: Staci Righter M.D.   On: 04/21/2019 12:35   Dg Knee 3 View Right  Result Date: 04/20/2019 CLINICAL DATA:  Hip fracture. EXAM: RIGHT KNEE - 3 VIEW COMPARISON:  09/19/2016 FINDINGS: No joint effusion. There is marked medial compartment joint space narrowing with marginal spur formation. Sharpening of the tibial spines noted. No fracture or dislocation. IMPRESSION: 1. Osteoarthritis with marked medial compartment narrowing. Electronically Signed   By: Kerby Moors M.D.   On: 04/20/2019 13:16    Scheduled Meds: . chlorhexidine  60 mL Topical Once  . [MAR Hold] donepezil  10 mg Oral Daily  . [MAR Hold] enoxaparin (LOVENOX) injection  40 mg Subcutaneous Q24H  . [MAR Hold] famotidine  40 mg Oral BID  . [MAR Hold] fluticasone  2 spray Each Nare Daily  . [MAR Hold] fluticasone furoate-vilanterol  1 puff Inhalation Daily  . [MAR Hold] furosemide  20 mg Oral Daily  . [MAR Hold] ipratropium  2 spray Each Nare BID  . [MAR Hold] memantine  10 mg Oral BID  . [MAR Hold] nebivolol  2.5 mg Oral Daily  . povidone-iodine  2 application Topical Once  . [MAR Hold] sertraline  50 mg Oral QHS   Continuous Infusions: . sodium chloride 50 mL/hr at 04/21/19 0600  . sodium chloride 1,000 mL (04/21/19 0839)     LOS: 1 day   Marylu Lund, MD [facility] Pager On Amion  If 7PM-7AM, please contact night-coverage 04/21/2019, 1:47 PM

## 2019-04-21 NOTE — Progress Notes (Signed)
Patient refused to drink the Pre-surgery clear ensure despite the prodding of her brother at bedside.  Earlier patient refused her scheduled vitals, blood works and scheduled medication except PRN pain medication morphine injection per patient's brother request.

## 2019-04-22 ENCOUNTER — Encounter (HOSPITAL_COMMUNITY): Payer: Self-pay | Admitting: Orthopedic Surgery

## 2019-04-22 LAB — CBC
HCT: 31.7 % — ABNORMAL LOW (ref 36.0–46.0)
Hemoglobin: 10 g/dL — ABNORMAL LOW (ref 12.0–15.0)
MCH: 29.8 pg (ref 26.0–34.0)
MCHC: 31.5 g/dL (ref 30.0–36.0)
MCV: 94.3 fL (ref 80.0–100.0)
Platelets: 191 10*3/uL (ref 150–400)
RBC: 3.36 MIL/uL — ABNORMAL LOW (ref 3.87–5.11)
RDW: 13.8 % (ref 11.5–15.5)
WBC: 9.9 10*3/uL (ref 4.0–10.5)
nRBC: 0 % (ref 0.0–0.2)

## 2019-04-22 LAB — BASIC METABOLIC PANEL
Anion gap: 9 (ref 5–15)
BUN: 14 mg/dL (ref 8–23)
CO2: 26 mmol/L (ref 22–32)
Calcium: 8.4 mg/dL — ABNORMAL LOW (ref 8.9–10.3)
Chloride: 107 mmol/L (ref 98–111)
Creatinine, Ser: 0.94 mg/dL (ref 0.44–1.00)
GFR calc Af Amer: >60 mL/min (ref >60)
GFR calc non Af Amer: 57 mL/min — ABNORMAL LOW (ref >60)
Glucose, Bld: 127 mg/dL — ABNORMAL HIGH (ref 70–99)
Potassium: 4.2 mmol/L (ref 3.5–5.1)
Sodium: 142 mmol/L (ref 135–145)

## 2019-04-22 NOTE — Evaluation (Signed)
Physical Therapy Evaluation Patient Details Name: Virginia [name] MRN: [id number] DOB: Jan 15, 1936 Today's Date: 04/22/2019   History of Present Illness  Pt is an 83 y.o. female admitted 04/20/19 after unwitnessed fall at home with RLE pain; found to have displaced R intertrochanteric fx. Now s/p R intertrochanteric IM nail 5/21. PMH includes dementia, CHF, CAD, COPD, CKD III, anxiety.    Clinical Impression  Pt presents with an overall decrease in functional mobility secondary to above. Pt poor historian; per family/chart, pt lives at home with family, household ambulator without DME, pushed in w/c in community. Today, pt requiring totalA for bed mobility and ADLs; combative and adamantly refusing mobility, difficult to redirect, unable to mobilize RLE or progress to edge of bed. Pt's brother present throughout session.   If pt agreeable to participate with therapies, would ultimately recommend SNF to maximize functional mobility and independence prior to return home. I understand family's hesitancy with regards to SNF given pt's worsening cognition in an unfamiliar environment, especially if no visitors allowed. Family plans to have pt return home with increased DME and assist. Recommend use of hoyer lift, but unsure pt will be able to tolerate. Will continue to follow acutely and assist with discharge planning/equipment needs.    Follow Up Recommendations Home health PT;Supervision/Assistance - 24 hour(family declining SNF recommendation; will require max HH/aide services)    Equipment Recommendations  3in1 (PT);Hospital bed;Other (comment)(hoyer lift)    Recommendations for Other Services       Precautions / Restrictions Precautions Precautions: Fall Restrictions Weight Bearing Restrictions: Yes RLE Weight Bearing: Weight bearing as tolerated      Mobility  Bed Mobility Overal bed mobility: Needs Assistance Bed Mobility: Rolling Rolling: Total assist;+2 for physical assistance          General bed mobility comments: Pt adamantly refusing bed mobility and OOB; when attempting to initiate BLE movement to EOB, pt becoming upset/combative and painful. TotalA(+2) to roll R/L with +3 assist as pt dependent for pericare and extra assist helpful due to agitation. Actively resisting all assist  Transfers Overall transfer level: (pt refusing)               General transfer comment: Unable  Ambulation/Gait                Stairs            Wheelchair Mobility    Modified Rankin (Stroke Patients Only)       Balance                                             Pertinent Vitals/Pain Pain Assessment: Faces Faces Pain Scale: Hurts whole lot Pain Location: RLE with any movement Pain Descriptors / Indicators: Operative site guarding;Moaning;Discomfort Pain Intervention(s): Limited activity within patient's tolerance;Monitored during session    [place] expects to be discharged to:: Private residence Living Arrangements: Other relatives(brother, son) Available Help at Discharge: Family;Available 24 hours/day Type of Home: House Home Access: Stairs to enter Entrance Stairs-Rails: None Entrance Stairs-Number of Steps: 3 Home Layout: One level Home Equipment: Walker - 2 wheels;Cane - single point;Wheelchair - Sport and exercise psychologist Comments: Family working on getting hospital bed and 24/7 assist    Prior Function Level of Independence: Needs assistance   Gait / Transfers Assistance Needed: Household ambulation without DME. Community mobility - family pushes her in w/c. Brother  always present to help pt up/down steps  ADL's / Homemaking Assistance Needed: Aid assist 2x weekly with shower        Hand Dominance   Dominant Hand: Right    Extremity/Trunk Assessment   Upper Extremity Assessment Upper Extremity Assessment: Overall WFL for tasks assessed(functionally very strong resisting movements)     Lower Extremity Assessment Lower Extremity Assessment: RLE deficits/detail;LLE deficits/detail;Difficult to assess due to impaired cognition RLE Deficits / Details: Functionally observed pt complete SAQ, but not actively moving RLE or allowing RLE to be moved RLE: Unable to fully assess due to pain RLE Coordination: decreased gross motor;decreased fine motor LLE Deficits / Details: Observed strength at least 3/5       Communication   Communication: No difficulties  Cognition Arousal/Alertness: Awake/alert Behavior During Therapy: Agitated Overall Cognitive Status: History of cognitive impairments - at baseline                                 General Comments: Pt with h/o dementia. Pt combative with PT/OT during bed mobility. Pt refusing to participate in mobility. Unable to redirect despite multiple attempts. Per chart, typically oriented to herself and recognizes family members (brother reports pt not combative at home)      General Comments General comments (skin integrity, edema, etc.): Brother [name]) present throughout session    Exercises     Assessment/Plan    PT Assessment Patient needs continued PT services  PT Problem List Decreased strength;Decreased range of motion;Decreased activity tolerance;Decreased balance;Decreased mobility;Decreased cognition;Decreased knowledge of use of DME;Decreased safety awareness;Decreased knowledge of precautions;Pain;Decreased skin integrity       PT Treatment Interventions DME instruction;Gait training;Stair training;Functional mobility training;Therapeutic activities;Therapeutic exercise;Balance training;Cognitive remediation;Patient/family education;Wheelchair mobility training    PT Goals (Current goals can be found in the Care Plan section)  Acute Rehab PT Goals Patient Stated Goal: pt wants to go home PT Goal Formulation: With patient/family Time For Goal Achievement: 05/06/19 Potential to Achieve Goals:  Fair    Frequency Min 3X/week   Barriers to discharge        Co-evaluation   Reason for Co-Treatment: For patient/therapist safety;Necessary to address cognition/behavior during functional activity   OT goals addressed during session: ADL's and self-care       AM-PAC PT "6 Clicks" Mobility  Outcome Measure Help needed turning from your back to your side while in a flat bed without using bedrails?: Total Help needed moving from lying on your back to sitting on the side of a flat bed without using bedrails?: Total Help needed moving to and from a bed to a chair (including a wheelchair)?: Total Help needed standing up from a chair using your arms (e.g., wheelchair or bedside chair)?: Total Help needed to walk in hospital room?: Total Help needed climbing 3-5 steps with a railing? : Total 6 Click Score: 6    End of Session   Activity Tolerance: Treatment limited secondary to agitation Patient left: in bed;with call bell/phone within reach;with family/visitor present;with bed alarm set Nurse Communication: Mobility status PT Visit Diagnosis: Other abnormalities of gait and mobility (R26.89);Muscle weakness (generalized) (M62.81);Pain Pain - Right/Left: Right Pain - part of body: Hip    Time: 3536-1443 PT Time Calculation (min) (ACUTE ONLY): 29 min   Charges:   PT Evaluation $PT Eval Moderate Complexity: [place], PT, DPT Acute Rehabilitation Services  Pager [phone] Office [phone]  Ellison Hughs L  Gionni Freese 04/22/2019, 4:30 PM

## 2019-04-22 NOTE — TOC Progression Note (Signed)
Transition of Care [facility]) - Progression Note    Patient Details  Name: ARIZONA SORN MRN: [id number] Date of Birth: May 12, 1936  Transition of Care [facility]) CM/SW [place], [place] Phone Number: 04/22/2019, 4:28 PM  Clinical Narrative:    CSW spoke with pt daughter again, discussed pt resistance to work with rehab staff and the challenges that it will present at SNF and at Home. Pt daughter updated with tentative singular offer from [facility], they are checking to see if they can accommodate a private family sitter. Pt daughter to discuss with pt brother and son.   [facility] has tentatively approved but are waiting on therapy evaluation notes; pt daughter aware of this also.  At current time pt is a difficult disposition due to multiple factors. Will update weekend CSW/CM to discuss further with pt daughter.    Expected Discharge Plan: [place] Barriers to Discharge: Continued Medical Work up  Expected Discharge Plan and Services Expected Discharge Plan: [place] In-house Referral: Clinical Social Work Discharge Planning Services: CM Consult Post Acute Care Choice: Home Health, [place], Durable Medical Equipment Living arrangements for the past 2 months: [place]: [place] Date [place]: 04/22/19 Time HH Agency Contacted: 1200 Representative spoke with at [place]: [place] (SDOH) Interventions    Readmission Risk Interventions No flowsheet data found.

## 2019-04-22 NOTE — NC FL2 (Signed)
[place] LEVEL OF CARE SCREENING TOOL     IDENTIFICATION  Patient Name: Virginia [name] Birthdate: 03-02-1936 Sex: female Admission Date (Current Location): 04/20/2019  [facility] and [place] Number:  Herbalist and Address:  [facility] [place], [place] [address], [place], [place] [address]      Provider Number: [id number]  Attending Physician Name and Address:  Donne Hazel, MD  Relative Name and Phone Number:  Emelda Kohlbeck; daughter; [phone]    Current Level of Care: Hospital Recommended Level of Care: [place] Prior Approval Number:    Date Approved/Denied:   PASRR Number: [id number]  Discharge Plan:      Current Diagnoses: Patient Active Problem List   Diagnosis Date Noted  . Displaced intertrochanteric fracture of right femur ([place]) 04/21/2019  . Closed intertrochanteric fracture of hip, right, initial encounter ([place]) 04/20/2019  . Leukocytosis 04/20/2019  . Fall at home, initial encounter 04/20/2019  . Primary osteoarthritis involving multiple joints 07/08/2017  . Dementia with behavioral disturbance ([place]) 07/08/2017  . Anxiety 07/08/2017  . Gastroesophageal reflux disease 07/08/2017  . Hypotension 02/01/2017  . CKD (chronic kidney disease), stage III ([place]) 02/01/2017  . Cough 01/31/2016  . Vitamin D deficiency 02/15/2015  . Hyperlipidemia 01/18/2015  . COPD GOLD IV 12/30/2014  . Chronic diastolic CHF (congestive heart failure) ([place]) 12/08/2014  . Dyspnea   . Hyponatremia 12/02/2014  . Essential hypertension 12/02/2014  . Nausea and vomiting 11/30/2014  . Memory disorder 10/24/2014  . Carotid artery disease ([place]) 10/19/2012    Orientation RESPIRATION BLADDER Height & Weight     Self  O2(2L nasal canula) Continent, External catheter Weight: 178 lb (80.7 kg) Height:  5\' 2"  (157.5 cm)  BEHAVIORAL SYMPTOMS/MOOD NEUROLOGICAL BOWEL NUTRITION STATUS      Continent Diet  AMBULATORY STATUS  COMMUNICATION OF NEEDS Skin   Extensive Assist Verbally Surgical wounds                       Personal Care Assistance Level of Assistance  Feeding, Dressing, Bathing Bathing Assistance: Maximum assistance Feeding assistance: Limited assistance Dressing Assistance: Maximum assistance     Functional Limitations Info  Sight, Hearing, Speech Sight Info: Adequate Hearing Info: Adequate Speech Info: Adequate    SPECIAL CARE FACTORS FREQUENCY  OT (By licensed OT), PT (By licensed PT)     PT Frequency: 5x week OT Frequency: 5x week            Contractures Contractures Info: Not present    Additional Factors Info  Psychotropic, Allergies, Code Status Code Status Info: Full Code Allergies Info: LIDOCAINE, OTHER  Psychotropic Info: donepezil (ARICEPT) tablet 10 mg daily PO; memantine (NAMENDA) tablet 10 mg 2x daily PO; sertraline (ZOLOFT) tablet 50 mg daily at bedtime PO         Current Medications (04/22/2019):  This is the current hospital active medication list Current Facility-Administered Medications  Medication Dose Route Frequency Provider Last Rate Last Dose  . 0.9 %  sodium chloride infusion   Intravenous Continuous Rod Can, MD 50 mL/hr at 04/21/19 0600    . albuterol (PROVENTIL) (2.5 MG/3ML) 0.083% nebulizer solution 5 mg  5 mg Nebulization Q4H PRN Swinteck, Aaron Edelman, MD      . diclofenac sodium (VOLTAREN) 1 % transdermal gel 2 g  2 g Topical BID PRN Swinteck, Aaron Edelman, MD      . docusate sodium (COLACE) capsule 100 mg  100 mg Oral BID Swinteck,  Aaron Edelman, MD   100 mg at 04/22/19 0805  . donepezil (ARICEPT) tablet 10 mg  10 mg Oral Daily Rod Can, MD   10 mg at 04/22/19 0813  . enoxaparin (LOVENOX) injection 40 mg  40 mg Subcutaneous Q24H Rod Can, MD   40 mg at 04/22/19 0805  . famotidine (PEPCID) tablet 40 mg  40 mg Oral BID Rod Can, MD   40 mg at 04/22/19 0805  . fluticasone (FLONASE) 50 MCG/ACT nasal spray 2 spray  2 spray Each Nare Daily  Rod Can, MD   2 spray at 04/22/19 [phone]  . fluticasone furoate-vilanterol (BREO ELLIPTA) 100-25 MCG/INH 1 puff  1 puff Inhalation Daily Swinteck, Aaron Edelman, MD      . furosemide (LASIX) tablet 20 mg  20 mg Oral Daily Rod Can, MD   20 mg at 04/22/19 0805  . hydrALAZINE (APRESOLINE) injection 10 mg  10 mg Intravenous Q4H PRN Swinteck, Aaron Edelman, MD      . HYDROcodone-acetaminophen (NORCO/VICODIN) 5-325 MG per tablet 1-2 tablet  1-2 tablet Oral Q6H PRN Rod Can, MD   1 tablet at 04/22/19 0804  . ipratropium (ATROVENT) 0.06 % nasal spray 2 spray  2 spray Each Nare BID Swinteck, Aaron Edelman, MD      . ipratropium-albuterol (DUONEB) 0.5-2.5 (3) MG/3ML nebulizer solution 3 mL  3 mL Nebulization Q6H PRN Swinteck, Aaron Edelman, MD      . LORazepam (ATIVAN) injection 0.5 mg  0.5 mg Intravenous Q6H PRN Rod Can, MD   0.5 mg at 04/20/19 1036  . memantine (NAMENDA) tablet 10 mg  10 mg Oral BID Rod Can, MD   10 mg at 04/22/19 0804  . menthol-cetylpyridinium (CEPACOL) lozenge 3 mg  1 lozenge Oral PRN Swinteck, Aaron Edelman, MD       Or  . phenol (CHLORASEPTIC) mouth spray 1 spray  1 spray Mouth/Throat PRN Swinteck, Aaron Edelman, MD      . methocarbamol (ROBAXIN) tablet 500 mg  500 mg Oral Q6H PRN Swinteck, Aaron Edelman, MD       Or  . methocarbamol (ROBAXIN) 500 mg in dextrose 5 % 50 mL IVPB  500 mg Intravenous Q6H PRN Swinteck, Aaron Edelman, MD      . metoCLOPramide (REGLAN) tablet 5-10 mg  5-10 mg Oral Q8H PRN Swinteck, Aaron Edelman, MD       Or  . metoCLOPramide (REGLAN) injection 5-10 mg  5-10 mg Intravenous Q8H PRN Swinteck, Aaron Edelman, MD      . morphine 2 MG/ML injection 0.5 mg  0.5 mg Intravenous Q2H PRN Swinteck, Aaron Edelman, MD   0.5 mg at 04/21/19 0558  . nebivolol (BYSTOLIC) tablet 2.5 mg  2.5 mg Oral Daily Rod Can, MD   2.5 mg at 04/22/19 7124  . ondansetron (ZOFRAN) tablet 4 mg  4 mg Oral Q6H PRN Swinteck, Aaron Edelman, MD       Or  . ondansetron (ZOFRAN) injection 4 mg  4 mg Intravenous Q6H PRN Swinteck, Aaron Edelman, MD      .  polyvinyl alcohol (LIQUIFILM TEARS) 1.4 % ophthalmic solution 1 drop  1 drop Both Eyes PRN Swinteck, Aaron Edelman, MD      . sertraline (ZOLOFT) tablet 50 mg  50 mg Oral QHS Rod Can, MD   50 mg at 04/21/19 2134     Discharge Medications: Please see discharge summary for a list of discharge medications.  Relevant Imaging Results:  Relevant Lab Results:   Additional Information SS#[id number] [place], [place]

## 2019-04-22 NOTE — Progress Notes (Signed)
PROGRESS NOTE    Virginia [name]  EAV:[id number] DOB: 1936/01/03 DOA: 04/20/2019 PCP: Lauree Chandler, NP    Brief Narrative:  83 y.o. female with medical history significant of CHF, CAD, left carotid artery stenosis s/p enterectomy, COPD, CKD stage III dementia, and anxiety; who presents after having a unwitnessed fall at home.  Patient has a history of dementia and history is obtained from her brother who is present at bedside.  Apparently around 2:30 AM this morning she was found outside her door complaining of pain in her back and right hip.  Unclear if the patient had any loss of consciousness normally patient is cared for by her son and  brother at home.  She utilizes a walker intermittently to ambulate, but appears not to have been using it this morning.  It was abnormal for her to get up in the middle of the night as she had normally done this.  Patient was significantly anxious and agitated on arrival which is unusual for her and brother attributed this to pain  Assessment & Plan:   Principal Problem:   Closed intertrochanteric fracture of hip, right, initial encounter [facility]) Active Problems:   Essential hypertension   COPD GOLD IV   Dementia with behavioral disturbance (HCC)   Anxiety   Gastroesophageal reflux disease   Leukocytosis   Fall at home, initial encounter   Displaced intertrochanteric fracture of right femur ([place])  Fall at home with closed right intertrochanteric fracture of right hip, initial encounter: Acute.  Patient had unwitnessed fall at home complaining of right hip pain.  X-rays revealed displaced intertrochanteric fracture on the right.  -Orthopedic Surgery following -Pt underwent surgery 5/21 -Follow up with PT eval  Leukocytosis: Acute.  WBC mildly elevated at 10.7.  Chest x-ray was otherwise clear and with suspect of a white blood cell count related with acute fracture. -UA pos for nitrites, leuk -.Remains afebrile and appears stable -Cont to hold  antimicrobial unless pt becomes symptomatic or if febrile  Essential hypertension: Blood pressures elevated up to 187/87 at time of presentation.  Suspect multifactorial including acute pain. -Continue Bystolic and furosemide as tolerated -Continue hydralazine IV as needed for elevated blood pressure -BP stable and controlled at this time  COPD, without acute exacerbation: Stable.  Chest x-ray revealing cardiomegaly without signs of edema or infiltrate. -Continue home regimen of Breo and Duonebs -Had been on RA, currently on 2LNC this AM. Continue to wean O2 as tolerated  CKD stage III: Creatinine 1.10 on admission.  Overall kidney function appears better than previous seen. -recheck bmet in AM  Dementia: Patient at baseline able to recognize family members. -Continued on Namenda and donepezil -Family is at bedside per visitation policy  Anxiety: Patient was noted to be very anxious and agitated on arrival.  Normally takes Zoloft for anxiety. -Continue Zoloft -Add Ativan 0.5 mg IV as needed anxiety/agitation  GERD -Continue Pepcid and Protonix as tolerated -Stable at present   DVT prophylaxis: Lovenox subQ Code Status: Full Family Communication: Pt in room, family sitting at bedside Disposition Plan: Uncertain at this time, PT eval in progress  Consultants:   Orthopedic Surgery  Procedures:  Intramedullary fixation, Right femur - 5/21, Dr. Lyla Glassing  Antimicrobials: Anti-infectives (From admission, onward)   Start     Dose/Rate Route Frequency Ordered Stop   04/21/19 1600  ceFAZolin (ANCEF) IVPB 2g/100 mL premix     2 g 200 mL/hr over 30 Minutes Intravenous Every 6 hours 04/21/19 1414 04/21/19 2211  04/21/19 0600  ceFAZolin (ANCEF) IVPB 2g/100 mL premix     2 g 200 mL/hr over 30 Minutes Intravenous On call to O.R. 04/20/19 1827 04/21/19 1610      Subjective: Without complaints this AM  Objective: Vitals:   04/21/19 1424 04/21/19 2105 04/21/19 2359  04/22/19 0510  BP: (!) 155/81 (!) 116/54 (!) 120/56 138/60  Pulse: 91 (!) 101 89 100  Resp: 13 14 15 15   Temp: (!) 97.4 F (36.3 C) 99.7 F (37.6 C) 98.9 F (37.2 C) 98.2 F (36.8 C)  TempSrc: Oral Axillary Oral Oral  SpO2: 97% 98% 97% 99%  Weight:      Height:        Intake/Output Summary (Last 24 hours) at 04/22/2019 1140 Last data filed at 04/22/2019 0950 Gross per 24 hour  Intake 1146.83 ml  Output 900 ml  Net 246.83 ml   Filed Weights   04/20/19 0443 04/21/19 0854  Weight: 80.7 kg 80.7 kg    Examination: General exam: Awake, laying in bed, in nad Respiratory system: Normal respiratory effort, no wheezing Cardiovascular system: regular rate, s1, s2 Gastrointestinal system: Soft, nondistended, positive BS Central nervous system: CN2-12 grossly intact, strength intact Extremities: Perfused, no clubbing Skin: Normal skin turgor, no notable skin lesions seen Psychiatry: Mood normal // no visual hallucinations   Data Reviewed: I have personally reviewed following labs and imaging studies  CBC: Recent Labs  Lab 04/20/19 0456 04/21/19 0747 04/22/19 0112  WBC 10.7* 10.4 9.9  NEUTROABS 8.0*  --   --   HGB 13.6 12.3 10.0*  HCT 43.2 40.0 31.7*  MCV 94.3 96.2 94.3  PLT 234 185 960   Basic Metabolic Panel: Recent Labs  Lab 04/20/19 0456 04/21/19 0747 04/22/19 0112  NA 142 143 142  K 4.1 4.6 4.2  CL 106 107 107  CO2 27 25 26   GLUCOSE 145* 115* 127*  BUN 14 12 14   CREATININE 1.10* 0.98 0.94  CALCIUM 9.0 9.0 8.4*   GFR: Estimated Creatinine Clearance: 46.2 mL/min (by C-G formula based on SCr of 0.94 mg/dL). Liver Function Tests: No results for input(s): AST, ALT, ALKPHOS, BILITOT, PROT, ALBUMIN in the last 168 hours. No results for input(s): LIPASE, AMYLASE in the last 168 hours. No results for input(s): AMMONIA in the last 168 hours. Coagulation Profile: Recent Labs  Lab 04/20/19 0456  INR 1.0   Cardiac Enzymes: No results for input(s): CKTOTAL,  CKMB, CKMBINDEX, TROPONINI in the last 168 hours. BNP (last 3 results) Recent Labs    08/30/18 1423 09/27/18 1446  PROBNP 345 503   HbA1C: No results for input(s): HGBA1C in the last 72 hours. CBG: No results for input(s): GLUCAP in the last 168 hours. Lipid Profile: No results for input(s): CHOL, HDL, LDLCALC, TRIG, CHOLHDL, LDLDIRECT in the last 72 hours. Thyroid Function Tests: No results for input(s): TSH, T4TOTAL, FREET4, T3FREE, THYROIDAB in the last 72 hours. Anemia Panel: No results for input(s): VITAMINB12, FOLATE, FERRITIN, TIBC, IRON, RETICCTPCT in the last 72 hours. Sepsis Labs: No results for input(s): PROCALCITON, LATICACIDVEN in the last 168 hours.  Recent Results (from the past 240 hour(s))  SARS Coronavirus 2 (CEPHEID - Performed in [place] hospital lab), Hosp Order     Status: None   Collection Time: 04/20/19  5:34 AM  Result Value Ref Range Status   SARS Coronavirus 2 NEGATIVE NEGATIVE Final    Comment: (NOTE) If result is NEGATIVE SARS-CoV-2 target nucleic acids are NOT DETECTED. The SARS-CoV-2 RNA is  generally detectable in upper and lower  respiratory specimens during the acute phase of infection. The lowest  concentration of SARS-CoV-2 viral copies this assay can detect is 250  copies / mL. A negative result does not preclude SARS-CoV-2 infection  and should not be used as the sole basis for treatment or other  patient management decisions.  A negative result may occur with  improper specimen collection / handling, submission of specimen other  than nasopharyngeal swab, presence of viral mutation(s) within the  areas targeted by this assay, and inadequate number of viral copies  (<250 copies / mL). A negative result must be combined with clinical  observations, patient history, and epidemiological information. If result is POSITIVE SARS-CoV-2 target nucleic acids are DETECTED. The SARS-CoV-2 RNA is generally detectable in upper and lower   respiratory specimens dur ing the acute phase of infection.  Positive  results are indicative of active infection with SARS-CoV-2.  Clinical  correlation with patient history and other diagnostic information is  necessary to determine patient infection status.  Positive results do  not rule out bacterial infection or co-infection with other viruses. If result is PRESUMPTIVE POSTIVE SARS-CoV-2 nucleic acids MAY BE PRESENT.   A presumptive positive result was obtained on the submitted specimen  and confirmed on repeat testing.  While 2019 novel coronavirus  (SARS-CoV-2) nucleic acids may be present in the submitted sample  additional confirmatory testing may be necessary for epidemiological  and / or clinical management purposes  to differentiate between  SARS-CoV-2 and other Sarbecovirus currently known to infect humans.  If clinically indicated additional testing with an alternate test  methodology [phone]) is advised. The SARS-CoV-2 RNA is generally  detectable in upper and lower respiratory sp ecimens during the acute  phase of infection. The expected result is Negative. Fact Sheet for Patients:  StrictlyIdeas.no Fact Sheet for Healthcare Providers: BankingDealers.co.za This test is not yet approved or cleared by the [place] FDA and has been authorized for detection and/or diagnosis of SARS-CoV-2 by FDA under an Emergency Use Authorization (EUA).  This EUA will remain in effect (meaning this test can be used) for the duration of the COVID-19 declaration under Section 564(b)(1) of the Act, 21 U.S.C. section 360bbb-3(b)(1), unless the authorization is terminated or revoked sooner. Performed at [facility], [place] [address], [place], [place] [address]   Surgical pcr screen     Status: None   Collection Time: 04/20/19 10:12 PM  Result Value Ref Range Status   MRSA, PCR NEGATIVE NEGATIVE Final   Staphylococcus aureus  NEGATIVE NEGATIVE Final    Comment: (NOTE) The Xpert SA Assay (FDA approved for NASAL specimens in patients 48 years of age and older), is one component of a comprehensive surveillance program. It is not intended to diagnose infection nor to guide or monitor treatment. Performed at [facility], [place] [address] [place], [place] [address]      Radiology Studies: Pelvis Portable  Result Date: 04/21/2019 CLINICAL DATA:  Fracture EXAM: PORTABLE PELVIS 1-2 VIEWS COMPARISON:  04/20/2019 FINDINGS: The patient is status post ORIF of the right femur. The alignment is improved. Multiple displaced fracture fragments are again noted. Extensive subcutaneous gas and overlying soft tissue edema is noted. There is diffuse osteopenia. There are degenerative changes of the left hip IMPRESSION: Status post ORIF of the right femur with improved osseous alignment. Electronically Signed   By: Constance Holster M.D.   On: 04/21/2019 14:27   Dg C-arm 1-60 Min  Result Date: 04/21/2019 CLINICAL DATA:  Postop. EXAM: DG C-ARM 61-120 MIN; RIGHT FEMUR 2 VIEWS COMPARISON:  Plain films 04/20/2019 FINDINGS: Intraoperative spot films demonstrate intramedullary rod and screw placement across an intertrochanteric fracture. IMPRESSION: As above. Improved position and alignment. Electronically Signed   By: Staci Righter M.D.   On: 04/21/2019 12:35   Dg Femur, Min 2 Views Right  Result Date: 04/21/2019 CLINICAL DATA:  Postop. EXAM: DG C-ARM 61-120 MIN; RIGHT FEMUR 2 VIEWS COMPARISON:  Plain films 04/20/2019 FINDINGS: Intraoperative spot films demonstrate intramedullary rod and screw placement across an intertrochanteric fracture. IMPRESSION: As above. Improved position and alignment. Electronically Signed   By: Staci Righter M.D.   On: 04/21/2019 12:35   Dg Femur [place] 2 Views Right  Result Date: 04/21/2019 CLINICAL DATA:  Fracture EXAM: RIGHT FEMUR PORTABLE 2 VIEW COMPARISON:  04/20/2019 FINDINGS: The patient is now  status post ORIF of the right femur. The hardware is intact. The alignment is improved. There are expected postsurgical changes including subcutaneous gas and overlying soft tissue edema. Vascular calcifications are noted. There are advanced degenerative changes of the right knee. There is no significant right knee joint effusion. IMPRESSION: Status post ORIF of the right femur with improved osseous alignment. Electronically Signed   By: Constance Holster M.D.   On: 04/21/2019 14:28    Scheduled Meds: . docusate sodium  100 mg Oral BID  . donepezil  10 mg Oral Daily  . enoxaparin (LOVENOX) injection  40 mg Subcutaneous Q24H  . famotidine  40 mg Oral BID  . fluticasone  2 spray Each Nare Daily  . fluticasone furoate-vilanterol  1 puff Inhalation Daily  . furosemide  20 mg Oral Daily  . ipratropium  2 spray Each Nare BID  . memantine  10 mg Oral BID  . nebivolol  2.5 mg Oral Daily  . sertraline  50 mg Oral QHS   Continuous Infusions: . sodium chloride 50 mL/hr at 04/21/19 0600  . methocarbamol (ROBAXIN) IV       LOS: 2 days   Marylu Lund, MD [facility] Pager On Amion  If 7PM-7AM, please contact night-coverage 04/22/2019, 11:40 AM

## 2019-04-22 NOTE — Progress Notes (Signed)
Occupational Therapy Evaluation Patient Details Name: Virginia [name] MRN: [id number] DOB: 31-Aug-1936 Today's Date: 04/22/2019    History of Present Illness Pt is an 83 y.o. female admitted 04/20/19 after unwitnessed fall at home with RLE pain; found to have displaced R intertrochanteric fx. Now s/p R intertrochanteric IM nail 5/21. PMH includes dementia, CHF, CAD, COPD, CKD III, anxiety.   Clinical Impression   Pt s/p R intertrochanteric IM nail. PTA, pt lives at home with family, receiving assist for bathing, some dressing assist and ambulates household distances without DME.  Pt very combative during evaluation (likely due to dementia). She becomes resistant and argumentative with encouragement to mobilize to EOB. +2 total assist to perform rolling in bed to left and right sides with PT and OT while nurse tech provided total assist with peri care. Pt hitting and scratching therapists during bed mobility.  Therapy recommendation is SNF to progress rehab safely. However, pt's family is very adamant to take her home. They report they can provide necessary support.  Difficult to determine how much assist pt will need with transfers at this time as she is very combative/resistant and did not transfer OOB today.  Based on today's evaluation, pt will need hospital bed, 3n1 BSC and hoyer lift at home with family.     Follow Up Recommendations  Home health OT;Supervision/Assistance - 24 hour(family declining SNF)    Equipment Recommendations  3 in 1 bedside commode;Hospital bed;Other (comment)(hoyer lift)    Recommendations for Other Services       Precautions / Restrictions Precautions Precautions: Fall      Mobility Bed Mobility Overal bed mobility: Needs Assistance Bed Mobility: Rolling Rolling: +2 for physical assistance;Total assist         General bed mobility comments: +2 assist for rolling to left and right sides during peri care. 3rd person helpful to guard against pt's attempts to  hit and scratch.  Transfers Overall transfer level: (pt refusing)                    Balance                                           ADL either performed or assessed with clinical judgement   ADL Overall ADL's : Needs assistance/impaired         Upper Body Bathing: Bed level;Minimal assistance   Lower Body Bathing: Total assistance;Bed level   Upper Body Dressing : Minimal assistance   Lower Body Dressing: Total assistance;Bed level       Toileting- Clothing Manipulation and Hygiene: Total assistance;Bed level         General ADL Comments: Pt resistant to all functional tasks. Rolling in bed to left and right sides with PT/OT assist in order for NT to assist with peri care and pure wick placement. Pt resistant, yelling, hitting, scratching throughout rolling.     Vision         Perception     Praxis      Pertinent Vitals/Pain Pain Assessment: Faces Faces Pain Scale: Hurts whole lot Pain Location: right hip Pain Descriptors / Indicators: Operative site guarding;Moaning;Discomfort Pain Intervention(s): Monitored during session;Limited activity within patient's tolerance     Hand Dominance Right   Extremity/Trunk Assessment Upper Extremity Assessment Upper Extremity Assessment: Overall WFL for tasks assessed   Lower Extremity Assessment Lower Extremity Assessment: Defer to  PT evaluation       Communication Communication Communication: No difficulties   Cognition Arousal/Alertness: Awake/alert Behavior During Therapy: Agitated Overall Cognitive Status: History of cognitive impairments - at baseline                                 General Comments: Pt with h/o dementia. Pt combative, hitting and scratching PT/OT during bed mobility (assisting NT with rolling for peri-care).  Pt refusing to participate in mobility.   General Comments  Brother [name]) present throughout session.    Exercises     Shoulder  Instructions      Home Living Family/patient expects to be discharged to:: Private residence Living Arrangements: Other relatives Available Help at Discharge: Family;Available 24 hours/day Type of Home: House Home Access: Stairs to enter [employer] of Steps: 3 Entrance Stairs-Rails: None Home Layout: One level     Bathroom Shower/Tub: Teacher, early years/pre: Standard     Home Equipment: Environmental consultant - 2 wheels;Cane - single point;Wheelchair - manual;Shower seat          Prior Functioning/Environment Level of Independence: Needs assistance  Gait / Transfers Assistance Needed: Household ambulation without DME. Community mobility - family pushes her in w/c. Brother always present to help pt up/down steps ADL's / Homemaking Assistance Needed: Aid assist 2x weekly with shower            OT Problem List: Decreased strength;Decreased activity tolerance;Impaired balance (sitting and/or standing);Decreased knowledge of use of DME or AE;Pain      OT Treatment/Interventions: Self-care/ADL training;DME and/or AE instruction;Therapeutic activities;Patient/family education;Balance training;Cognitive remediation/compensation    OT Goals(Current goals can be found in the care plan section) Acute Rehab OT Goals Patient Stated Goal: pt wants to go home OT Goal Formulation: With patient/family Time For Goal Achievement: 05/06/19 Potential to Achieve Goals: Fair  OT Frequency: Min 2X/week   Barriers to D/C:            Co-evaluation PT/OT/SLP Co-Evaluation/Treatment: Yes Reason for Co-Treatment: For patient/therapist safety;Necessary to address cognition/behavior during functional activity   OT goals addressed during session: ADL's and self-care      AM-PAC OT "6 Clicks" Daily Activity     Outcome Measure Help from another person eating meals?: None Help from another person taking care of personal grooming?: A Little Help from another person toileting, which  includes using toliet, bedpan, or urinal?: Total Help from another person bathing (including washing, rinsing, drying)?: A Lot Help from another person to put on and taking off regular upper body clothing?: A Little Help from another person to put on and taking off regular lower body clothing?: Total 6 Click Score: 14   End of Session Nurse Communication: Mobility status(NT in room during part of evaluation)  Activity Tolerance: Treatment limited secondary to agitation Patient left: in bed;with call bell/phone within reach;with bed alarm set;with family/visitor present  OT Visit Diagnosis: Unsteadiness on feet (R26.81);Muscle weakness (generalized) (M62.81);Other abnormalities of gait and mobility (R26.89);History of falling (Z91.81);Other symptoms and signs involving cognitive function;Pain Pain - Right/Left: Right Pain - part of body: Hip                Time: 5462-7035 OT Time Calculation (min): 29 min Charges:  OT General Charges $OT Visit: 1 Visit OT Evaluation $OT Eval Moderate Complexity: 1 Mod    , [place] [phone] 04/22/2019, 4:11 PM

## 2019-04-22 NOTE — TOC Initial Note (Signed)
Transition of Care [facility]) - Initial/Assessment Note    Patient Details  Name: Virginia [name] MRN: [id number] Date of Birth: 02-25-1936  Transition of Care [facility]) CM/SW Contact:    [place], [place] Phone Number: 04/22/2019, 10:04 AM  Clinical Narrative:                 CSW spoke with pt and pt brother in room. Introduced self, role, reason for visit. Pt brother [name] answered most questions due to pt memory loss. Pt from home with son and brother. She usually ambulates without a walker although she has one at home. Pt daughter is on her way from [place] and is arranging aide services. CSW asked for permission to call daughter [name] [phone]). Pt brother gave permissions. Will f/u with [name].   Expected Discharge Plan: [place] Barriers to Discharge: Continued Medical Work up   Patient Goals and CMS Choice   CMS Medicare.gov Compare Post Acute Care list provided to:: Patient Represenative (must comment) Choice offered to / list presented to : Adult Children  Expected Discharge Plan and Services Expected Discharge Plan: [place] In-house Referral: Clinical Social Work Discharge Planning Services: CM Consult   Prior Living Arrangements/Services   Lives with:: Siblings, Adult Children Patient language and need for interpreter reviewed:: Yes(no needs) Do you feel safe going back to the place where you live?: Yes      Need for Family Participation in Patient Care: Yes (Comment)(decision making and assistance with ADLs) Care giver support system in place?: Yes (comment)(adult children and siblings) Current home services: DME Criminal Activity/Legal Involvement Pertinent to Current Situation/Hospitalization: No - Comment as needed  Activities of Daily Living Home Assistive Devices/Equipment: Wheelchair, Environmental consultant (specify type), Eyeglasses ADL Screening (condition at time of admission) Patient's cognitive ability adequate to safely  complete daily activities?: No Is the patient deaf or have difficulty hearing?: No Does the patient have difficulty seeing, even when wearing glasses/contacts?: No Does the patient have difficulty concentrating, remembering, or making decisions?: Yes Patient able to express need for assistance with ADLs?: Yes Does the patient have difficulty dressing or bathing?: Yes Independently performs ADLs?: Yes (appropriate for developmental age) Does the patient have difficulty walking or climbing stairs?: Yes Weakness of Legs: Both Weakness of Arms/Hands: None  Permission Sought/Granted Permission sought to share information with : Case Manager, Customer service manager, Family Supports Permission granted to share information with : Yes, Verbal Permission Granted(pt confused but able to say it's okay to call daughter)   Emotional Assessment Appearance:: Appears stated age Attitude/Demeanor/Rapport: Unable to Assess Affect (typically observed): Unable to Assess Orientation: : Oriented to Self, Fluctuating Orientation (Suspected and/or reported Sundowners) Alcohol / Substance Use: Not Applicable Psych Involvement: No (comment)  Admission diagnosis:  Closed fracture of right hip, initial encounter ([place]) [S72.001A] Closed displaced intertrochanteric fracture of right femur, initial encounter [facility]) [S72.141A] Patient Active Problem List   Diagnosis Date Noted  . Displaced intertrochanteric fracture of right femur ([place]) 04/21/2019  . Closed intertrochanteric fracture of hip, right, initial encounter ([place]) 04/20/2019  . Leukocytosis 04/20/2019  . Fall at home, initial encounter 04/20/2019  . Primary osteoarthritis involving multiple joints 07/08/2017  . Dementia with behavioral disturbance ([place]) 07/08/2017  . Anxiety 07/08/2017  . Gastroesophageal reflux disease 07/08/2017  . Hypotension 02/01/2017  . CKD (chronic kidney disease), stage III ([place]) 02/01/2017  . Cough 01/31/2016  . Vitamin  D deficiency 02/15/2015  . Hyperlipidemia 01/18/2015  . COPD GOLD IV  12/30/2014  . Chronic diastolic CHF (congestive heart failure) ([place]) 12/08/2014  . Dyspnea   . Hyponatremia 12/02/2014  . Essential hypertension 12/02/2014  . Nausea and vomiting 11/30/2014  . Memory disorder 10/24/2014  . Carotid artery disease ([place]) 10/19/2012   PCP:  Lauree Chandler, NP Pharmacy:   [facility] [phone] - [place], [place] - [address] [place] [address] Phone: [phone] Fax: [phone]  [facility] [phone] - [place], [place] - [address] [place] [address] Phone: [phone] Fax: [phone]   Social Determinants of Health ([place]) Interventions    Readmission Risk Interventions No flowsheet data found.

## 2019-04-22 NOTE — TOC Progression Note (Addendum)
Transition of Care [facility] - Progression Note    Patient Details  Name: KAYLIANA CODD MRN: [id number] Date of Birth: [birth year]  Transition of Care [facility]) CM/SW [place], [place] Phone Number: 04/22/2019, 12:18 PM  Clinical Narrative:    CSW spoke with pt daughter [name] on phone. Introduced self, role, reason for call. Pt daughter lives in [place] she is planning on coming up at the start of next week to assist with pt. She confirmed that pt is home with pt son ([name]'s brother [name]) and pt brother.  She had been planning on getting a hospital bed ordered for pt; pt lives in a one story home and has multiple pieces of DME but daughter thinks there may be additional needs. CSW let pt daughter know that PT/OT had not worked yet with pt and that at current time unsure what functionally pt will be recommended for. I did discuss with pt daughter that pt has been refusing care unless coaxed by pt brother. At all SNFs at this time there are no visitors and family would not be able to be at bedside 24/7 with pt.   Pt daughter concerned with COVID at SNFs and states that she is worried about pt going to a facility given that [facility], [facility] are not accepting pt at this time. Pt daughter upset that they would not be able to have family at bedside after we discussed restrictions on SNF placements- they do have a friend who they would like to utilize as a Charity fundraiser but at this time CSW unable to tell pt daughter which SNFs can offer or whether or not their own personal sitter could be utilized.   We discussed that we could make referral to [place] program given that pt has strong support. Gave brief overview of the program. Pt daughter a little wary as her aunt had a negative experience with CNAs coming into the home but is agreeable to pt being screened. We discussed that if pt went home and a SNF needed to be pursued after that the [place] and  PCP (confirmed it is Sherrie Mustache, NP) would be primary sources of making that referral.   At the end of the conversation pt daughter agreed for both referrals to be sent to SNFs and to [facility] program.   Await PT/OT evaluations in order to send referrals out to both programs.   Expected Discharge Plan: [place] Barriers to Discharge: Continued Medical Work up  Expected Discharge Plan and Services Expected Discharge Plan: [place] In-house Referral: Clinical Social Work Discharge Planning Services: CM Consult Post Acute Care Choice: Home Health, [place], Durable Medical Equipment Living arrangements for the past 2 months: [place]: [place] Date [place]: 04/22/19 Time HH Agency Contacted: 1200 Representative spoke with at [place]: [place] (SDOH) Interventions    Readmission Risk Interventions No flowsheet data found.

## 2019-04-23 NOTE — Progress Notes (Signed)
PROGRESS NOTE    Virginia [name]  VVO:[id number] DOB: 1936-06-12 DOA: 04/20/2019 PCP: Lauree Chandler, NP    Brief Narrative:  83 y.o. female with medical history significant of CHF, CAD, left carotid artery stenosis s/p enterectomy, COPD, CKD stage III dementia, and anxiety; who presents after having a unwitnessed fall at home.  Patient has a history of dementia and history is obtained from her brother who is present at bedside.  Apparently around 2:30 AM this morning she was found outside her door complaining of pain in her back and right hip.  Unclear if the patient had any loss of consciousness normally patient is cared for by her son and  brother at home.  She utilizes a walker intermittently to ambulate, but appears not to have been using it this morning.  It was abnormal for her to get up in the middle of the night as she had normally done this.  Patient was significantly anxious and agitated on arrival which is unusual for her and brother attributed this to pain  Assessment & Plan:   Principal Problem:   Closed intertrochanteric fracture of hip, right, initial encounter [facility]) Active Problems:   Essential hypertension   COPD GOLD IV   Dementia with behavioral disturbance (HCC)   Anxiety   Gastroesophageal reflux disease   Leukocytosis   Fall at home, initial encounter   Displaced intertrochanteric fracture of right femur ([place])  Fall at home with closed right intertrochanteric fracture of right hip, initial encounter: Acute.  Patient had unwitnessed fall at home complaining of right hip pain.  X-rays revealed displaced intertrochanteric fracture on the right.  -Orthopedic Surgery following -Pt underwent surgery 5/21 -PT following. SNF vs home with home health anticipated. SW is following  Leukocytosis: Acute.  WBC mildly elevated at 10.7.  Chest x-ray was otherwise clear and with suspect of a white blood cell count related with acute fracture. -UA pos for nitrites, leuk  -.Remains afebrile and appears stable -Will cont to hold antimicrobial . Pt thus far stable  Essential hypertension: Blood pressures elevated up to 187/87 at time of presentation.  Suspect multifactorial including acute pain. -Continue Bystolic and furosemide as tolerated -Continue hydralazine IV as needed for elevated blood pressure -BP stable at this time  COPD, without acute exacerbation: Stable.  Chest x-ray revealing cardiomegaly without signs of edema or infiltrate. -Continue home regimen of Breo and Duonebs -Had previously been on RA, currently on 3LNC this AM. Continue to wean O2 as tolerated  CKD stage III: Creatinine 1.10 on admission.  Overall kidney function appears better than previous seen. -will repeat  bmet in AM  Dementia: Patient at baseline able to recognize family members. -Continued on Namenda and donepezil -Family remains at bedside per visitation policy  Anxiety: Patient was noted to be very anxious and agitated on arrival.  Normally takes Zoloft for anxiety. -Continue Zoloft as tolerated -cont on Ativan 0.5 mg IV as needed anxiety/agitation  GERD -Continue Pepcid and Protonix as tolerated -Stable currently  DVT prophylaxis: Lovenox subQ Code Status: Full Family Communication: Pt in room, family sitting at bedside Disposition Plan: Uncertain at this time, PT eval in progress  Consultants:   Orthopedic Surgery  Procedures:  Intramedullary fixation, Right femur - 5/21, Dr. Lyla Glassing  Antimicrobials: Anti-infectives (From admission, onward)   Start     Dose/Rate Route Frequency Ordered Stop   04/21/19 1600  ceFAZolin (ANCEF) IVPB 2g/100 mL premix     2 g 200 mL/hr over 30 Minutes Intravenous  Every 6 hours 04/21/19 1414 04/21/19 2211   04/21/19 0600  ceFAZolin (ANCEF) IVPB 2g/100 mL premix     2 g 200 mL/hr over 30 Minutes Intravenous On call to O.R. 04/20/19 1827 04/21/19 0821      Subjective: Pleasantly confused this AM  Objective:  Vitals:   04/22/19 0510 04/22/19 2157 04/23/19 0636 04/23/19 0757  BP: 138/60 119/69 140/90   Pulse: 100 (!) 125 (!) 103   Resp: 15 17 17    Temp: 98.2 F (36.8 C)  98.2 F (36.8 C)   TempSrc: Oral  Oral   SpO2: 99% (!) 85% 98% 98%  Weight:      Height:        Intake/Output Summary (Last 24 hours) at 04/23/2019 1253 Last data filed at 04/23/2019 1103 Gross per 24 hour  Intake 600 ml  Output 700 ml  Net -100 ml   Filed Weights   04/20/19 0443 04/21/19 0854  Weight: 80.7 kg 80.7 kg    Examination: General exam: Conversant, in no acute distress Respiratory system: normal chest rise, clear, no audible wheezing Cardiovascular system: regular rhythm, s1-s2 Gastrointestinal system: Nondistended, nontender, pos BS Central nervous system: No seizures, no tremors Extremities: No cyanosis, no joint deformities Skin: No rashes, no pallor Psychiatry: Affect normal // no auditory hallucinations   Data Reviewed: I have personally reviewed following labs and imaging studies  CBC: Recent Labs  Lab 04/20/19 0456 04/21/19 0747 04/22/19 0112  WBC 10.7* 10.4 9.9  NEUTROABS 8.0*  --   --   HGB 13.6 12.3 10.0*  HCT 43.2 40.0 31.7*  MCV 94.3 96.2 94.3  PLT 234 185 973   Basic Metabolic Panel: Recent Labs  Lab 04/20/19 0456 04/21/19 0747 04/22/19 0112  NA 142 143 142  K 4.1 4.6 4.2  CL 106 107 107  CO2 27 25 26   GLUCOSE 145* 115* 127*  BUN 14 12 14   CREATININE 1.10* 0.98 0.94  CALCIUM 9.0 9.0 8.4*   GFR: Estimated Creatinine Clearance: 46.2 mL/min (by C-G formula based on SCr of 0.94 mg/dL). Liver Function Tests: No results for input(s): AST, ALT, ALKPHOS, BILITOT, PROT, ALBUMIN in the last 168 hours. No results for input(s): LIPASE, AMYLASE in the last 168 hours. No results for input(s): AMMONIA in the last 168 hours. Coagulation Profile: Recent Labs  Lab 04/20/19 0456  INR 1.0   Cardiac Enzymes: No results for input(s): CKTOTAL, CKMB, CKMBINDEX, TROPONINI in the  last 168 hours. BNP (last 3 results) Recent Labs    08/30/18 1423 09/27/18 1446  PROBNP 345 503   HbA1C: No results for input(s): HGBA1C in the last 72 hours. CBG: No results for input(s): GLUCAP in the last 168 hours. Lipid Profile: No results for input(s): CHOL, HDL, LDLCALC, TRIG, CHOLHDL, LDLDIRECT in the last 72 hours. Thyroid Function Tests: No results for input(s): TSH, T4TOTAL, FREET4, T3FREE, THYROIDAB in the last 72 hours. Anemia Panel: No results for input(s): VITAMINB12, FOLATE, FERRITIN, TIBC, IRON, RETICCTPCT in the last 72 hours. Sepsis Labs: No results for input(s): PROCALCITON, LATICACIDVEN in the last 168 hours.  Recent Results (from the past 240 hour(s))  SARS Coronavirus 2 (CEPHEID - Performed in [facility] hospital lab), Hosp Order     Status: None   Collection Time: 04/20/19  5:34 AM  Result Value Ref Range Status   SARS Coronavirus 2 NEGATIVE NEGATIVE Final    Comment: (NOTE) If result is NEGATIVE SARS-CoV-2 target nucleic acids are NOT DETECTED. The SARS-CoV-2 RNA is generally detectable  in upper and lower  respiratory specimens during the acute phase of infection. The lowest  concentration of SARS-CoV-2 viral copies this assay can detect is 250  copies / mL. A negative result does not preclude SARS-CoV-2 infection  and should not be used as the sole basis for treatment or other  patient management decisions.  A negative result may occur with  improper specimen collection / handling, submission of specimen other  than nasopharyngeal swab, presence of viral mutation(s) within the  areas targeted by this assay, and inadequate number of viral copies  (<250 copies / mL). A negative result must be combined with clinical  observations, patient history, and epidemiological information. If result is POSITIVE SARS-CoV-2 target nucleic acids are DETECTED. The SARS-CoV-2 RNA is generally detectable in upper and lower  respiratory specimens dur ing the acute  phase of infection.  Positive  results are indicative of active infection with SARS-CoV-2.  Clinical  correlation with patient history and other diagnostic information is  necessary to determine patient infection status.  Positive results do  not rule out bacterial infection or co-infection with other viruses. If result is PRESUMPTIVE POSTIVE SARS-CoV-2 nucleic acids MAY BE PRESENT.   A presumptive positive result was obtained on the submitted specimen  and confirmed on repeat testing.  While 2019 novel coronavirus  (SARS-CoV-2) nucleic acids may be present in the submitted sample  additional confirmatory testing may be necessary for epidemiological  and / or clinical management purposes  to differentiate between  SARS-CoV-2 and other Sarbecovirus currently known to infect humans.  If clinically indicated additional testing with an alternate test  methodology [phone]) is advised. The SARS-CoV-2 RNA is generally  detectable in upper and lower respiratory sp ecimens during the acute  phase of infection. The expected result is Negative. Fact Sheet for Patients:  StrictlyIdeas.no Fact Sheet for Healthcare Providers: BankingDealers.co.za This test is not yet approved or cleared by the [place] FDA and has been authorized for detection and/or diagnosis of SARS-CoV-2 by FDA under an Emergency Use Authorization (EUA).  This EUA will remain in effect (meaning this test can be used) for the duration of the COVID-19 declaration under Section 564(b)(1) of the Act, 21 U.S.C. section 360bbb-3(b)(1), unless the authorization is terminated or revoked sooner. Performed at [facility], [place] [address] [place], [place] [address]   Surgical pcr screen     Status: None   Collection Time: 04/20/19 10:12 PM  Result Value Ref Range Status   MRSA, PCR NEGATIVE NEGATIVE Final   Staphylococcus aureus NEGATIVE NEGATIVE Final    Comment: (NOTE) The  Xpert SA Assay (FDA approved for NASAL specimens in patients 45 years of age and older), is one component of a comprehensive surveillance program. It is not intended to diagnose infection nor to guide or monitor treatment. Performed at [facility], [place] [address], [place], [place] [address]      Radiology Studies: Pelvis Portable  Result Date: 04/21/2019 CLINICAL DATA:  Fracture EXAM: PORTABLE PELVIS 1-2 VIEWS COMPARISON:  04/20/2019 FINDINGS: The patient is status post ORIF of the right femur. The alignment is improved. Multiple displaced fracture fragments are again noted. Extensive subcutaneous gas and overlying soft tissue edema is noted. There is diffuse osteopenia. There are degenerative changes of the left hip IMPRESSION: Status post ORIF of the right femur with improved osseous alignment. Electronically Signed   By: Constance Holster M.D.   On: 04/21/2019 14:27   Dg Femur Port, Min 2 Views  Right  Result Date: 04/21/2019 CLINICAL DATA:  Fracture EXAM: RIGHT FEMUR PORTABLE 2 VIEW COMPARISON:  04/20/2019 FINDINGS: The patient is now status post ORIF of the right femur. The hardware is intact. The alignment is improved. There are expected postsurgical changes including subcutaneous gas and overlying soft tissue edema. Vascular calcifications are noted. There are advanced degenerative changes of the right knee. There is no significant right knee joint effusion. IMPRESSION: Status post ORIF of the right femur with improved osseous alignment. Electronically Signed   By: Constance Holster M.D.   On: 04/21/2019 14:28    Scheduled Meds: . docusate sodium  100 mg Oral BID  . donepezil  10 mg Oral Daily  . enoxaparin (LOVENOX) injection  40 mg Subcutaneous Q24H  . famotidine  40 mg Oral BID  . fluticasone  2 spray Each Nare Daily  . fluticasone furoate-vilanterol  1 puff Inhalation Daily  . furosemide  20 mg Oral Daily  . ipratropium  2 spray Each Nare BID  . memantine  10 mg Oral  BID  . nebivolol  2.5 mg Oral Daily  . sertraline  50 mg Oral QHS   Continuous Infusions: . sodium chloride 50 mL/hr at 04/21/19 0600  . methocarbamol (ROBAXIN) IV       LOS: 3 days   Marylu Lund, MD [facility] Pager On Amion  If 7PM-7AM, please contact night-coverage 04/23/2019, 12:53 PM

## 2019-04-23 NOTE — Progress Notes (Signed)
Physical Therapy Treatment Patient Details Name: Virginia [name] MRN: [id number] DOB: [birth year] Today's Date: 04/23/2019    History of Present Illness Pt is an 83 y.o. female admitted 04/20/19 after unwitnessed fall at home with RLE pain; found to have displaced R intertrochanteric fx. Now s/p R intertrochanteric IM nail 5/21. PMH includes dementia, CHF, CAD, COPD, CKD III, anxiety.    PT Comments    Pt making very slow progress with functional mobility. She continues to require heavy physical assistance of two people for bed mobility and mod-max A to maintain upright sitting at EOB. Pt would continue to benefit from skilled physical therapy services at this time while admitted and after d/c to address the below listed limitations in order to improve overall safety and independence with functional mobility.    Follow Up Recommendations  SNF;Supervision/Assistance - 24 hour (if pt's family decides to take her home, will need HHPT, [place], [place] aide and below listed DME)     Equipment Recommendations  3in1 (PT);Wheelchair (measurements PT);Wheelchair cushion (measurements PT);Hospital bed;Other (comment)(Hoyer Lift)    Recommendations for Other Services       Precautions / Restrictions Precautions Precautions: Fall Restrictions Weight Bearing Restrictions: Yes RLE Weight Bearing: Weight bearing as tolerated    Mobility  Bed Mobility Overal bed mobility: Needs Assistance Bed Mobility: Supine to Sit;Sit to Supine     Supine to sit: Total assist;+2 for physical assistance Sit to supine: Total assist;+2 for physical assistance   General bed mobility comments: pt able to assist with movement of her L LE towards EOB, also able to use UEs on bed rails to help; use of bed pads to position pt's hips at EOB  Transfers                 General transfer comment: pt adamantly refusing  Ambulation/Gait                 Stairs             Wheelchair Mobility     Modified Rankin (Stroke Patients Only)       Balance Overall balance assessment: History of Falls;Needs assistance Sitting-balance support: Feet supported;Bilateral upper extremity supported Sitting balance-Leahy Scale: Poor                                      Cognition Arousal/Alertness: Awake/alert Behavior During Therapy: WFL for tasks assessed/performed Overall Cognitive Status: History of cognitive impairments - at baseline                                 General Comments: h/o dementia; pt required multimodal cueing to perform tasks and lots of redirection needed      Exercises      General Comments        Pertinent Vitals/Pain Pain Assessment: Faces Faces Pain Scale: Hurts little more Pain Location: RLE with any movement Pain Descriptors / Indicators: Operative site guarding;Discomfort Pain Intervention(s): Monitored during session;Repositioned    Home Living                      Prior Function            PT Goals (current goals can now be found in the care plan section) Acute Rehab PT Goals PT Goal Formulation: With patient/family Time For Goal Achievement: 05/06/19  Potential to Achieve Goals: Fair Progress towards PT goals: Progressing toward goals    Frequency    Min 3X/week      PT Plan Discharge plan needs to be updated    Co-evaluation              AM-PAC PT "6 Clicks" Mobility   Outcome Measure  Help needed turning from your back to your side while in a flat bed without using bedrails?: Total Help needed moving from lying on your back to sitting on the side of a flat bed without using bedrails?: Total Help needed moving to and from a bed to a chair (including a wheelchair)?: Total Help needed standing up from a chair using your arms (e.g., wheelchair or bedside chair)?: Total Help needed to walk in hospital room?: Total Help needed climbing 3-5 steps with a railing? : Total 6 Click Score:  6    End of Session   Activity Tolerance: Patient limited by pain Patient left: in bed;with call bell/phone within reach;with bed alarm set;with family/visitor present Nurse Communication: Mobility status PT Visit Diagnosis: Other abnormalities of gait and mobility (R26.89);Muscle weakness (generalized) (M62.81);Pain Pain - Right/Left: Right Pain - part of body: Hip     Time: 1000-1025 PT Time Calculation (min) (ACUTE ONLY): 25 min  Charges:  $Therapeutic Activity: 23-37 mins                     Sherie Don, PT, DPT  Acute Rehabilitation Services Pager [phone] Office [place] 04/23/2019, 3:00 PM

## 2019-04-23 NOTE — Progress Notes (Signed)
Subjective: 2 Days Post-Op Procedure(s) (LRB): INTRAMEDULLARY (IM) NAIL INTERTROCHANTRIC (Right) Patient reports pain as mild to right hip.  Reports a good night. Progress with PT. Tolerating PO's. Family at bedside.   Objective: Vital signs in last 24 hours: Temp:  [98.2 F (36.8 C)] 98.2 F (36.8 C) (05/23 0636) Pulse Rate:  [103-125] 103 (05/23 0636) Resp:  [17] 17 (05/23 0636) BP: (119-140)/(69-90) 140/90 (05/23 0636) SpO2:  [85 %-98 %] 98 % (05/23 0636)  Intake/Output from previous day: 05/22 0701 - 05/23 0700 In: 480 [P.O.:480] Out: 600 [Urine:600] Intake/Output this shift: No intake/output data recorded.  Recent Labs    04/21/19 0747 04/22/19 0112  HGB 12.3 10.0*   Recent Labs    04/21/19 0747 04/22/19 0112  WBC 10.4 9.9  RBC 4.16 3.36*  HCT 40.0 31.7*  PLT 185 191   Recent Labs    04/21/19 0747 04/22/19 0112  NA 143 142  K 4.6 4.2  CL 107 107  CO2 25 26  BUN 12 14  CREATININE 0.98 0.94  GLUCOSE 115* 127*  CALCIUM 9.0 8.4*   No results for input(s): LABPT, INR in the last 72 hours.  Well nourished. Alert and oriented x3. RRR, Lungs clear, BS x4. Abdomen soft and non tender. Right Calf soft and non tender. Right hip dressing C/D/I. No DVT signs. Compartment soft. No signs of infection.  Right LE grossly neurovascular intact.  Assessment/Plan: 2 Days Post-Op Procedure(s) (LRB): INTRAMEDULLARY (IM) NAIL INTERTROCHANTRIC (Right) Up with PT RLE WBAT with [place] Advance diet Family updated On Lovenox   Anticipated LOS equal to or greater than 2 midnights due to - Age 81 and older with one or more of the following:  - Obesity  - Expected need for hospital services (PT, OT, Nursing) required for safe  discharge  - Anticipated need for postoperative skilled nursing care or inpatient rehab  - Active co-morbidities: None OR   - Unanticipated findings during/Post Surgery: Slow post-op progression: GI, pain control, mobility  - Patient is a high risk  of re-admission due to: None    Virginia [name] Virginia [name] 04/23/2019, 7:51 AM

## 2019-04-24 ENCOUNTER — Inpatient Hospital Stay (HOSPITAL_COMMUNITY): Payer: Medicare Other

## 2019-04-24 MED ORDER — FUROSEMIDE 40 MG PO TABS: 40.0000 mg | ORAL_TABLET | Freq: Every day | ORAL | Status: DC

## 2019-04-24 MED ORDER — FUROSEMIDE 10 MG/ML IJ SOLN
40.0000 mg | Freq: Every day | INTRAMUSCULAR | Status: DC
  Administered 2019-04-24 – 2019-04-25 (×2): 40 mg via INTRAVENOUS
  Filled 2019-04-24 (×2): qty 4

## 2019-04-24 NOTE — Progress Notes (Signed)
Patient ID: Virginia [name], female   DOB: 1936-07-21, 83 y.o.   MRN: [id number] Subjective: 3 Days Post-Op Procedure(s) (LRB): INTRAMEDULLARY (IM) NAIL INTERTROCHANTRIC (Right)    Patient sleeping soundly this am albeit with apneic breathing.  Family or sitter in room sleeping as well. No events recorded   Objective:   VITALS:   Vitals:   04/23/19 2209 04/24/19 0506  BP: (!) 151/77 (!) 151/89  Pulse: (!) 109 (!) 106  Resp: 18 18  Temp: 98.9 F (37.2 C) 99.2 F (37.3 C)  SpO2: 100% 100%    Neurovascular intact Incision: dressing C/D/I - right hip  LABS Recent Labs    04/21/19 0747 04/22/19 0112  HGB 12.3 10.0*  HCT 40.0 31.7*  WBC 10.4 9.9  PLT 185 191    Recent Labs    04/21/19 0747 04/22/19 0112  NA 143 142  K 4.6 4.2  BUN 12 14  CREATININE 0.98 0.94  GLUCOSE 115* 127*    No results for input(s): LABPT, INR in the last 72 hours.   Assessment/Plan: 3 Days Post-Op Procedure(s) (LRB): INTRAMEDULLARY (IM) NAIL INTERTROCHANTRIC (Right)   Up with therapy  Disposition based on therapy needs RTC in 2 weeks with [facility] for wound check and X-rays Weight bearing instructions in place

## 2019-04-24 NOTE — Progress Notes (Signed)
Pt had good results from her Lasix given IV earlier.  Discussed DC plan with brother.  Gave pt Ativan 0.5 mg IV for restlessness and anxiety and pt resting now.  Brother at bedside.

## 2019-04-24 NOTE — Progress Notes (Signed)
PROGRESS NOTE    Virginia [name]  QQI:[id number] DOB: Nov 07, 1936 DOA: 04/20/2019 PCP: Lauree Chandler, NP    Brief Narrative:  83 y.o. female with medical history significant of CHF, CAD, left carotid artery stenosis s/p enterectomy, COPD, CKD stage III dementia, and anxiety; who presents after having a unwitnessed fall at home.  Patient has a history of dementia and history is obtained from her brother who is present at bedside.  Apparently around 2:30 AM this morning she was found outside her door complaining of pain in her back and right hip.  Unclear if the patient had any loss of consciousness normally patient is cared for by her son and  brother at home.  She utilizes a walker intermittently to ambulate, but appears not to have been using it this morning.  It was abnormal for her to get up in the middle of the night as she had normally done this.  Patient was significantly anxious and agitated on arrival which is unusual for her and brother attributed this to pain  Assessment & Plan:   Principal Problem:   Closed intertrochanteric fracture of hip, right, initial encounter [facility]) Active Problems:   Essential hypertension   COPD GOLD IV   Dementia with behavioral disturbance (HCC)   Anxiety   Gastroesophageal reflux disease   Leukocytosis   Fall at home, initial encounter   Displaced intertrochanteric fracture of right femur ([place])  Fall at home with closed right intertrochanteric fracture of right hip, initial encounter: Acute.  Patient had unwitnessed fall at home complaining of right hip pain.  X-rays revealed displaced intertrochanteric fracture on the right.  -Orthopedic Surgery following -Pt underwent surgery 5/21 -PT following. Home with home health anticipated. SW following  Leukocytosis: Acute.  WBC mildly elevated at 10.7.  Chest x-ray was otherwise clear and with suspect of a white blood cell count related with acute fracture. -UA pos for nitrites, leuk -.Remains  afebrile and appears stable -Will cont to hold antimicrobial . Pt remains stable thus far  Essential hypertension: Blood pressures elevated up to 187/87 at time of presentation.  Suspect multifactorial including acute pain. -Continue Bystolic and furosemide as tolerated -Continue hydralazine IV as needed for elevated blood pressure -BP stable at present  COPD, without acute exacerbation: Stable.  Chest x-ray revealing cardiomegaly without signs of edema or infiltrate. -Continue home regimen of Breo and Duonebs -Wean O2 as tolerated  CKD stage III: Creatinine 1.10 on admission.  Overall kidney function appears better than previous seen. -Recheck bmet in AM  Dementia: Patient at baseline able to recognize family members. -Continued on Namenda and donepezil -Family remains at bedside per visitation policy  Anxiety: Patient was noted to be very anxious and agitated on arrival.  Normally takes Zoloft for anxiety. -Continue Zoloft as tolerated -cont on Ativan 0.5 mg IV PRN as tolerated   GERD -Continue Pepcid and Protonix as tolerated -Stable currently  Acute on chronic diastolic CHF exacerbation -Patient had been on lasix prior to admit, had missed several doses in hospital -Pt remains on O2, noted to be difficult to wean -Will transition lasix to IV. Thus far, pt able to wean from O2 with good urine output -Repeat bmet in AM  DVT prophylaxis: Lovenox subQ Code Status: Full Family Communication: Pt in room, family sitting at bedside Disposition Plan: Possible home with home health in 24hrs  Consultants:   Orthopedic Surgery  Procedures:  Intramedullary fixation, Right femur - 5/21, Dr. Lyla Glassing  Antimicrobials: Anti-infectives (From admission, onward)  Start     Dose/Rate Route Frequency Ordered Stop   04/21/19 1600  ceFAZolin (ANCEF) IVPB 2g/100 mL premix     2 g 200 mL/hr over 30 Minutes Intravenous Every 6 hours 04/21/19 1414 04/21/19 2211   04/21/19 0600   ceFAZolin (ANCEF) IVPB 2g/100 mL premix     2 g 200 mL/hr over 30 Minutes Intravenous On call to O.R. 04/20/19 1827 04/21/19 0821      Subjective: Confused and combative this AM  Objective: Vitals:   04/23/19 0636 04/23/19 0757 04/23/19 2209 04/24/19 0506  BP: 140/90  (!) 151/77 (!) 151/89  Pulse: (!) 103  (!) 109 (!) 106  Resp: 17  18 18   Temp: 98.2 F (36.8 C)  98.9 F (37.2 C) 99.2 F (37.3 C)  TempSrc: Oral  Oral Oral  SpO2: 98% 98% 100% 100%  Weight:      Height:        Intake/Output Summary (Last 24 hours) at 04/24/2019 1556 Last data filed at 04/24/2019 0522 Gross per 24 hour  Intake 100 ml  Output 350 ml  Net -250 ml   Filed Weights   04/20/19 0443 04/21/19 0854  Weight: 80.7 kg 80.7 kg    Examination: General exam: Awake, laying in bed, in nad Respiratory system: increased resp effort, no wheezing Cardiovascular system: regular rate, s1, s2 Gastrointestinal system: Soft, nondistended, positive BS Central nervous system: CN2-12 grossly intact, strength intact Extremities: Perfused, no clubbing Skin: Normal skin turgor, no notable skin lesions seen Psychiatry: Mood normal // no visual hallucinations   Data Reviewed: I have personally reviewed following labs and imaging studies  CBC: Recent Labs  Lab 04/20/19 0456 04/21/19 0747 04/22/19 0112  WBC 10.7* 10.4 9.9  NEUTROABS 8.0*  --   --   HGB 13.6 12.3 10.0*  HCT 43.2 40.0 31.7*  MCV 94.3 96.2 94.3  PLT 234 185 314   Basic Metabolic Panel: Recent Labs  Lab 04/20/19 0456 04/21/19 0747 04/22/19 0112  NA 142 143 142  K 4.1 4.6 4.2  CL 106 107 107  CO2 27 25 26   GLUCOSE 145* 115* 127*  BUN 14 12 14   CREATININE 1.10* 0.98 0.94  CALCIUM 9.0 9.0 8.4*   GFR: Estimated Creatinine Clearance: 46.2 mL/min (by C-G formula based on SCr of 0.94 mg/dL). Liver Function Tests: No results for input(s): AST, ALT, ALKPHOS, BILITOT, PROT, ALBUMIN in the last 168 hours. No results for input(s): LIPASE,  AMYLASE in the last 168 hours. No results for input(s): AMMONIA in the last 168 hours. Coagulation Profile: Recent Labs  Lab 04/20/19 0456  INR 1.0   Cardiac Enzymes: No results for input(s): CKTOTAL, CKMB, CKMBINDEX, TROPONINI in the last 168 hours. BNP (last 3 results) Recent Labs    08/30/18 1423 09/27/18 1446  PROBNP 345 503   HbA1C: No results for input(s): HGBA1C in the last 72 hours. CBG: No results for input(s): GLUCAP in the last 168 hours. Lipid Profile: No results for input(s): CHOL, HDL, LDLCALC, TRIG, CHOLHDL, LDLDIRECT in the last 72 hours. Thyroid Function Tests: No results for input(s): TSH, T4TOTAL, FREET4, T3FREE, THYROIDAB in the last 72 hours. Anemia Panel: No results for input(s): VITAMINB12, FOLATE, FERRITIN, TIBC, IRON, RETICCTPCT in the last 72 hours. Sepsis Labs: No results for input(s): PROCALCITON, LATICACIDVEN in the last 168 hours.  Recent Results (from the past 240 hour(s))  SARS Coronavirus 2 (CEPHEID - Performed in [place] hospital lab), Hosp Order     Status: None  Collection Time: 04/20/19  5:34 AM  Result Value Ref Range Status   SARS Coronavirus 2 NEGATIVE NEGATIVE Final    Comment: (NOTE) If result is NEGATIVE SARS-CoV-2 target nucleic acids are NOT DETECTED. The SARS-CoV-2 RNA is generally detectable in upper and lower  respiratory specimens during the acute phase of infection. The lowest  concentration of SARS-CoV-2 viral copies this assay can detect is 250  copies / mL. A negative result does not preclude SARS-CoV-2 infection  and should not be used as the sole basis for treatment or other  patient management decisions.  A negative result may occur with  improper specimen collection / handling, submission of specimen other  than nasopharyngeal swab, presence of viral mutation(s) within the  areas targeted by this assay, and inadequate number of viral copies  (<250 copies / mL). A negative result must be combined with clinical   observations, patient history, and epidemiological information. If result is POSITIVE SARS-CoV-2 target nucleic acids are DETECTED. The SARS-CoV-2 RNA is generally detectable in upper and lower  respiratory specimens dur ing the acute phase of infection.  Positive  results are indicative of active infection with SARS-CoV-2.  Clinical  correlation with patient history and other diagnostic information is  necessary to determine patient infection status.  Positive results do  not rule out bacterial infection or co-infection with other viruses. If result is PRESUMPTIVE POSTIVE SARS-CoV-2 nucleic acids MAY BE PRESENT.   A presumptive positive result was obtained on the submitted specimen  and confirmed on repeat testing.  While 2019 novel coronavirus  (SARS-CoV-2) nucleic acids may be present in the submitted sample  additional confirmatory testing may be necessary for epidemiological  and / or clinical management purposes  to differentiate between  SARS-CoV-2 and other Sarbecovirus currently known to infect humans.  If clinically indicated additional testing with an alternate test  methodology [phone]) is advised. The SARS-CoV-2 RNA is generally  detectable in upper and lower respiratory sp ecimens during the acute  phase of infection. The expected result is Negative. Fact Sheet for Patients:  StrictlyIdeas.no Fact Sheet for Healthcare Providers: BankingDealers.co.za This test is not yet approved or cleared by the [place] FDA and has been authorized for detection and/or diagnosis of SARS-CoV-2 by FDA under an Emergency Use Authorization (EUA).  This EUA will remain in effect (meaning this test can be used) for the duration of the COVID-19 declaration under Section 564(b)(1) of the Act, 21 U.S.C. section 360bbb-3(b)(1), unless the authorization is terminated or revoked sooner. Performed at [facility], [place] [address]  [place], [place] [address]   Surgical pcr screen     Status: None   Collection Time: 04/20/19 10:12 PM  Result Value Ref Range Status   MRSA, PCR NEGATIVE NEGATIVE Final   Staphylococcus aureus NEGATIVE NEGATIVE Final    Comment: (NOTE) The Xpert SA Assay (FDA approved for NASAL specimens in patients 18 years of age and older), is one component of a comprehensive surveillance program. It is not intended to diagnose infection nor to guide or monitor treatment. Performed at [facility], [place] [address] [place], [place] [address]      Radiology Studies: Dg Chest [facility] 1 View  Result Date: 04/24/2019 CLINICAL DATA:  CHF, dementia EXAM: PORTABLE CHEST 1 VIEW COMPARISON:  04/20/2019 FINDINGS: There may be some degree of retrocardiac atelectasis. No definite acute airspace abnormality in AP portable projection. Cardiomegaly. IMPRESSION: There may be some degree of retrocardiac atelectasis. No definite acute airspace  abnormality in AP portable projection. Cardiomegaly. Electronically Signed   By: Eddie Candle M.D.   On: 04/24/2019 14:35    Scheduled Meds: . docusate sodium  100 mg Oral BID  . donepezil  10 mg Oral Daily  . enoxaparin (LOVENOX) injection  40 mg Subcutaneous Q24H  . famotidine  40 mg Oral BID  . fluticasone  2 spray Each Nare Daily  . fluticasone furoate-vilanterol  1 puff Inhalation Daily  . furosemide  40 mg Intravenous Daily  . ipratropium  2 spray Each Nare BID  . memantine  10 mg Oral BID  . nebivolol  2.5 mg Oral Daily  . sertraline  50 mg Oral QHS   Continuous Infusions: . methocarbamol (ROBAXIN) IV       LOS: 4 days   Marylu Lund, MD [facility] Pager On Amion  If 7PM-7AM, please contact night-coverage 04/24/2019, 3:56 PM

## 2019-04-24 NOTE — Progress Notes (Signed)
Daughter wanting discharge with with [facility] services. Case Manager made aware of request. CSW will continue to assist as needed.

## 2019-04-24 NOTE — Progress Notes (Signed)
Pt refused her CXR.

## 2019-04-24 NOTE — Care Management (Addendum)
The patient is unable to bear weight normally on RLE due to recent fracture and pain.  The patient requires assistance of 2 people while in bed and when moving from bed to chair.    The patient requires a wheelchair to maneuver and perform ADLs in the home including bathing, toileting, dressing, etc.  The patient is unable to propel a standard wheelchair due to weakness and shortness of breath.    The patient also requires a hospital bed to be able to elevate the head of the bed 30 degrees or more for frequent and immediate repositioning.  Also, she need side rails for repositioning and safety as she is unable to do this independently.  The bed needs to be raised and lowered to allow her to receive total care.  A hoyer lift is necessary to move her from the bed to the wheelchair as 2 or more people are required to do this safely.
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
## 2019-04-24 NOTE — Progress Notes (Signed)
The patient has been declining her inhaler. She may do better with a nebulizer.

## 2019-04-24 NOTE — Progress Notes (Signed)
Talking to brother who is in room with pt. He states that the daughter lives in [place] is coordinating care and will have to drive up tonight to be here tomorrow if pt is going to be discharged tomorrow.  If not, and she can be discharged on Tuesday, then the dtr can sleep tonight and drive up tomorrow.

## 2019-04-24 NOTE — Progress Notes (Signed)
Pt/family refused scheduled night medications, this RN explained importance, pt's brother in room verbalized understanding. Will continue to monitor.

## 2019-04-25 LAB — BASIC METABOLIC PANEL
Anion gap: 11 (ref 5–15)
BUN: 18 mg/dL (ref 8–23)
CO2: 27 mmol/L (ref 22–32)
Calcium: 8.5 mg/dL — ABNORMAL LOW (ref 8.9–10.3)
Chloride: 104 mmol/L (ref 98–111)
Creatinine, Ser: 0.74 mg/dL (ref 0.44–1.00)
GFR calc Af Amer: >60 mL/min (ref >60)
GFR calc non Af Amer: >60 mL/min (ref >60)
Glucose, Bld: 117 mg/dL — ABNORMAL HIGH (ref 70–99)
Potassium: 3.6 mmol/L (ref 3.5–5.1)
Sodium: 142 mmol/L (ref 135–145)

## 2019-04-25 LAB — CBC
HCT: 28.9 % — ABNORMAL LOW (ref 36.0–46.0)
Hemoglobin: 9.2 g/dL — ABNORMAL LOW (ref 12.0–15.0)
MCH: 29.7 pg (ref 26.0–34.0)
MCHC: 31.8 g/dL (ref 30.0–36.0)
MCV: 93.2 fL (ref 80.0–100.0)
Platelets: 230 10*3/uL (ref 150–400)
RBC: 3.1 MIL/uL — ABNORMAL LOW (ref 3.87–5.11)
RDW: 13.2 % (ref 11.5–15.5)
WBC: 8.3 10*3/uL (ref 4.0–10.5)
nRBC: 0 % (ref 0.0–0.2)

## 2019-04-25 NOTE — TOC Progression Note (Signed)
Transition of Care [facility]) - Progression Note    Patient Details  Name: Virginia [name] MRN: [id number] Date of Birth: 1935-12-23  Transition of Care [facility]) CM/SW Contact  Jacalyn Lefevre Edson Snowball, RN Phone Number: 04/25/2019, 9:57 AM  Clinical Narrative:     Spoke to patient's brother at bedside. Confirmed face sheet information. Patient to be discharged vis [facility]. [facility] paperwork completed and placed in shadow chart.   Patient's brother called patient's son [name]. [name] is at patient's home awaiting DME delivery today between 19 and 3. Once [name] receives DME he will call hius brother who will in turn call NCM for Ptar arrangements.   Need MD home health and face to face. Spoke with MD he will order HHPT   Expected Discharge Plan: [place] Barriers to Discharge: No Barriers Identified  Expected Discharge Plan and Services Expected Discharge Plan: [place] In-house Referral: Clinical Social Work Discharge Planning Services: CM Consult Post Acute Care Choice: [place] arrangements for the past 2 months: [place]                 DME Arranged: 3-N-1, Hospital bed, Gel overlay, Lightweight manual wheelchair with seat cushion(Hoyer lift) DME Agency: [employer] Date DME Agency Contacted: 04/24/19 Time DME Agency Contacted: 1520 Representative spoke with at DME Agency: [place] Arranged: RN, PT, OT, Nurse's Aide [place] Agency: [place] Date [place]: 04/24/19 Time [place]: 1330 Representative spoke with at [place]: [place] ([place]) Interventions    Readmission Risk Interventions No flowsheet data found.

## 2019-04-25 NOTE — Progress Notes (Signed)
Occupational Therapy Treatment Patient Details Name: Virginia [name] MRN: [id number] DOB: Jul 08, 1936 Today's Date: 04/25/2019    History of present illness Pt is an 83 y.o. female admitted 04/20/19 after unwitnessed fall at home with RLE pain; found to have displaced R intertrochanteric fx. Now s/p R intertrochanteric IM nail 5/21. PMH includes dementia, CHF, CAD, COPD, CKD III, anxiety.   OT comments  Pt progressed from bed to [facility] and back to bed this session total +2 mAx (A). Pt high fall risk and risk for skin break down due to prolonged bed level care. Pt with central vision use during session and requires anterior approach for participation. therapists playing music during session to help with calming. Pt will require total +2 for transfer to safely d/c at this time. OT recommends SNF pending family ability to provide total +2 max (A) at home level with [facility].    Follow Up Recommendations  SNF;Supervision/Assistance - 24 hour    Equipment Recommendations  3 in 1 bedside commode;Hospital bed;Other (comment)    Recommendations for Other Services      Precautions / Restrictions Precautions Precautions: Fall Precaution Comments: risk for skin break down due to prolonged suping Restrictions Weight Bearing Restrictions: Yes RLE Weight Bearing: Weight bearing as tolerated       Mobility Bed Mobility Overal bed mobility: Needs Assistance Bed Mobility: Supine to Sit;Sit to Supine Rolling: Total assist;+2 for physical assistance   Supine to sit: Total assist;+2 for physical assistance Sit to supine: Total assist;+2 for physical assistance   General bed mobility comments: pt pivot transfer with pad to EOB with total (A) of bil LE in neutral position. Pt static sitting min (A). pt return to supine total +2 total .   Transfers Overall transfer level: Needs assistance Equipment used: Rolling walker (2 wheeled) Transfers: Sit to/from [facility] Sit to Stand: +2  physical assistance;Mod assist Stand pivot transfers: +2 physical assistance;Max assist       General transfer comment: pt requires bed elevated and anterior (A) to weigh shift. Pt needs max (A) to transfer to the [facility].     Balance Overall balance assessment: History of Falls;Needs assistance Sitting-balance support: Single extremity supported;Feet supported Sitting balance-Leahy Scale: Poor                                     ADL either performed or assessed with clinical judgement   ADL Overall ADL's : Needs assistance/impaired Eating/Feeding: Set up   Grooming: Set up   Upper Body Bathing: Minimal assistance   Lower Body Bathing: Total assistance   Upper Body Dressing : Moderate assistance   Lower Body Dressing: Total assistance   Toilet Transfer: +2 for physical assistance;Maximal assistance;RW;BSC   Toileting- Clothing Manipulation and Hygiene: Total assistance         General ADL Comments: pt requesting to go to the bathroom and no recall. pt needed redirection to the task several times during session.  Pt startles easily due to central vision and grabbing at therapist in response to any movement     Vision       Perception     Praxis      Cognition Arousal/Alertness: Awake/alert Behavior During Therapy: Agitated Overall Cognitive Status: History of cognitive impairments - at baseline  General Comments: h/o dementia; pt benefits from calm soft voice and central vision positioning of therapist. Music played this session as a calming tool        Exercises     Shoulder Instructions       General Comments risk for skin break down due to prolonged bed level . pt positioned in chair position at this time.     Pertinent Vitals/ Pain       Pain Assessment: Faces Faces Pain Scale: Hurts whole lot Pain Location: R LE with any tactile input Pain Descriptors / Indicators: Operative site  guarding;Discomfort;Grimacing Pain Intervention(s): Monitored during session;Premedicated before session;Repositioned  Home Living                                          Prior Functioning/Environment              Frequency  Min 2X/week        Progress Toward Goals  OT Goals(current goals can now be found in the care plan section)  Progress towards OT goals: Progressing toward goals  Acute Rehab OT Goals Patient Stated Goal: to go to the bathroom OT Goal Formulation: Patient unable to participate in goal setting Time For Goal Achievement: 05/06/19 Potential to Achieve Goals: Fair ADL Goals Pt Will Transfer to Toilet: squat pivot transfer;with max assist;bedside commode Additional ADL Goal #1: Pt will participate in bed mobility tasks with min assist as precursor for ADLs. Additional ADL Goal #2: Pt will tolerate sitting EOB for at least 5 minutes as precursor for EOB ADLs. Additional ADL Goal #3: Pt's caregivers will demonstrate independence with assisting pt with ADLs and transfers (hoyer lift for transfers as needed).  Plan Discharge plan remains appropriate    Co-evaluation    PT/OT/SLP Co-Evaluation/Treatment: Yes Reason for Co-Treatment: Necessary to address cognition/behavior during functional activity;For patient/therapist safety;To address functional/ADL transfers   OT goals addressed during session: ADL's and self-care;Proper use of Adaptive equipment and DME;Strengthening/ROM      AM-PAC OT "6 Clicks" Daily Activity     Outcome Measure   Help from another person eating meals?: None Help from another person taking care of personal grooming?: A Little Help from another person toileting, which includes using toliet, bedpan, or urinal?: Total Help from another person bathing (including washing, rinsing, drying)?: A Lot Help from another person to put on and taking off regular upper body clothing?: A Little Help from another person to put  on and taking off regular lower body clothing?: Total 6 Click Score: 14    End of Session Equipment Utilized During Treatment: Gait belt;Rolling walker  OT Visit Diagnosis: Unsteadiness on feet (R26.81);Muscle weakness (generalized) (M62.81);Other abnormalities of gait and mobility (R26.89);History of falling (Z91.81);Other symptoms and signs involving cognitive function;Pain Pain - Right/Left: Right Pain - part of body: Hip   Activity Tolerance Treatment limited secondary to agitation   Patient Left in bed;with call bell/phone within reach;with bed alarm set   Nurse Communication Mobility status;Precautions        Time: 8099-8338 OT Time Calculation (min): 23 min  Charges: OT General Charges $OT Visit: 1 Visit OT Treatments $Self Care/Home Management : 8-22 mins   Jeri Modena, OTR/L  Acute Rehabilitation Services Pager: [phone] Office: [phone] .    Jeri Modena 04/25/2019, 11:21 AM

## 2019-04-25 NOTE — Care Management Important Message (Signed)
Important Message  Patient Details  Name: Virginia [name] MRN: [id number] Date of Birth: [birth year]   Medicare Important Message Given:  Yes    Khari Mally Montine Circle 04/25/2019, 3:57 PM

## 2019-04-25 NOTE — Plan of Care (Signed)
  Problem: Clinical Measurements: Goal: Postoperative complications will be avoided or minimized Outcome: Progressing   Problem: Self-Concept: Goal: Ability to maintain and perform role responsibilities to the fullest extent possible will improve Outcome: Progressing   Problem: Pain Management: Goal: Pain level will decrease Outcome: Progressing   

## 2019-04-25 NOTE — Progress Notes (Signed)
PROGRESS NOTE    NEZIAH VOGELGESANG  ENI:[id number] DOB: [birth year] DOA: 04/20/2019 PCP: Lauree Chandler, NP    Brief Narrative:  83 y.o. female with medical history significant of CHF, CAD, left carotid artery stenosis s/p enterectomy, COPD, CKD stage III dementia, and anxiety; who presents after having a unwitnessed fall at home.  Patient has a history of dementia and history is obtained from her brother who is present at bedside.  Apparently around 2:30 AM this morning she was found outside her door complaining of pain in her back and right hip.  Unclear if the patient had any loss of consciousness normally patient is cared for by her son and  brother at home.  She utilizes a walker intermittently to ambulate, but appears not to have been using it this morning.  It was abnormal for her to get up in the middle of the night as she had normally done this.  Patient was significantly anxious and agitated on arrival which is unusual for her and brother attributed this to pain  Assessment & Plan:   Principal Problem:   Closed intertrochanteric fracture of hip, right, initial encounter [facility]) Active Problems:   Essential hypertension   COPD GOLD IV   Dementia with behavioral disturbance (HCC)   Anxiety   Gastroesophageal reflux disease   Leukocytosis   Fall at home, initial encounter   Displaced intertrochanteric fracture of right femur ([place])  Fall at home with closed right intertrochanteric fracture of right hip, initial encounter: Acute.  Patient had unwitnessed fall at home complaining of right hip pain.  X-rays revealed displaced intertrochanteric fracture on the right.  -Orthopedic Surgery following -Pt underwent surgery 5/21 -PT following. Home with home health anticipated. CM following. Discussed with family at bedside. Current bed would be be removed until tomorrow AM, thus unable to have hospital bed until then. Family seems interested in staying until hospital bed is set  up  Leukocytosis: Acute.  WBC mildly elevated at 10.7.  Chest x-ray was otherwise clear and with suspect of a white blood cell count related with acute fracture. -UA pos for nitrites, leuk -.Remains afebrile and appears stable -Will cont to hold antimicrobial . Stable at present  Essential hypertension: Blood pressures elevated up to 187/87 at time of presentation.  Suspect multifactorial including acute pain. -Continue Bystolic and furosemide as tolerated -Continue hydralazine IV as needed for elevated blood pressure -BP currently stable  COPD, without acute exacerbation: Stable.  Chest x-ray revealing cardiomegaly without signs of edema or infiltrate. -Continue home regimen of Breo and Duonebs -Now on room air  CKD stage III: Creatinine 1.10 on admission.  Overall kidney function appears better than previous seen.  Dementia: Patient at baseline able to recognize family members. -Continued on Namenda and donepezil -Family remains at bedside per visitation policy  Anxiety: Patient was noted to be very anxious and agitated on arrival.  Normally takes Zoloft for anxiety. -Continue Zoloft as tolerated -cont on Ativan 0.5 mg IV PRN as tolerated   GERD -Continue Pepcid and Protonix as tolerated -Stable currently  Acute on chronic diastolic CHF exacerbation -Patient had been on lasix prior to admit, had missed several doses in hospital -Pt remains on O2, noted to be difficult to wean -Will transition lasix to IV. Thus far, pt able to wean from O2 with good urine output -Weaned to room air  DVT prophylaxis: Lovenox subQ Code Status: Full Family Communication: Pt in room, family sitting at bedside Disposition Plan: Possible home with home  health in 24hrs  Consultants:   Orthopedic Surgery  Procedures:  Intramedullary fixation, Right femur - 5/21, Dr. Lyla Glassing  Antimicrobials: Anti-infectives (From admission, onward)   Start     Dose/Rate Route Frequency Ordered Stop    04/21/19 1600  ceFAZolin (ANCEF) IVPB 2g/100 mL premix     2 g 200 mL/hr over 30 Minutes Intravenous Every 6 hours 04/21/19 1414 04/21/19 2211   04/21/19 0600  ceFAZolin (ANCEF) IVPB 2g/100 mL premix     2 g 200 mL/hr over 30 Minutes Intravenous On call to O.R. 04/20/19 1827 04/21/19 0821      Subjective: Pleasantly confused this AM  Objective: Vitals:   04/24/19 2057 04/24/19 2200 04/25/19 0703 04/25/19 0833  BP: (!) 153/79  (!) 141/77   Pulse: (!) 121 100 (!) 114   Resp: 18  18   Temp: 99.9 F (37.7 C)  97.9 F (36.6 C)   TempSrc: Axillary  Oral   SpO2: 93%  96% 92%  Weight:      Height:        Intake/Output Summary (Last 24 hours) at 04/25/2019 1412 Last data filed at 04/25/2019 1000 Gross per 24 hour  Intake 420 ml  Output 850 ml  Net -430 ml   Filed Weights   04/20/19 0443 04/21/19 0854  Weight: 80.7 kg 80.7 kg    Examination: General exam: Conversant, confused, in no acute distress Respiratory system: normal chest rise, clear, no audible wheezing Cardiovascular system: regular rhythm, s1-s2 Gastrointestinal system: Nondistended, nontender, pos BS Central nervous system: No seizures, no tremors Extremities: No cyanosis, no joint deformities Skin: No rashes, no pallor Psychiatry: Affect normal // no auditory hallucinations   Data Reviewed: I have personally reviewed following labs and imaging studies  CBC: Recent Labs  Lab 04/20/19 0456 04/21/19 0747 04/22/19 0112 04/25/19 1110  WBC 10.7* 10.4 9.9 8.3  NEUTROABS 8.0*  --   --   --   HGB 13.6 12.3 10.0* 9.2*  HCT 43.2 40.0 31.7* 28.9*  MCV 94.3 96.2 94.3 93.2  PLT 234 185 191 277   Basic Metabolic Panel: Recent Labs  Lab 04/20/19 0456 04/21/19 0747 04/22/19 0112 04/25/19 1110  NA 142 143 142 142  K 4.1 4.6 4.2 3.6  CL 106 107 107 104  CO2 27 25 26 27   GLUCOSE 145* 115* 127* 117*  BUN 14 12 14 18   CREATININE 1.10* 0.98 0.94 0.74  CALCIUM 9.0 9.0 8.4* 8.5*   GFR: Estimated Creatinine  Clearance: 54.2 mL/min (by C-G formula based on SCr of 0.74 mg/dL). Liver Function Tests: No results for input(s): AST, ALT, ALKPHOS, BILITOT, PROT, ALBUMIN in the last 168 hours. No results for input(s): LIPASE, AMYLASE in the last 168 hours. No results for input(s): AMMONIA in the last 168 hours. Coagulation Profile: Recent Labs  Lab 04/20/19 0456  INR 1.0   Cardiac Enzymes: No results for input(s): CKTOTAL, CKMB, CKMBINDEX, TROPONINI in the last 168 hours. BNP (last 3 results) Recent Labs    08/30/18 1423 09/27/18 1446  PROBNP 345 503   HbA1C: No results for input(s): HGBA1C in the last 72 hours. CBG: No results for input(s): GLUCAP in the last 168 hours. Lipid Profile: No results for input(s): CHOL, HDL, LDLCALC, TRIG, CHOLHDL, LDLDIRECT in the last 72 hours. Thyroid Function Tests: No results for input(s): TSH, T4TOTAL, FREET4, T3FREE, THYROIDAB in the last 72 hours. Anemia Panel: No results for input(s): VITAMINB12, FOLATE, FERRITIN, TIBC, IRON, RETICCTPCT in the last 72 hours. Sepsis Labs:  No results for input(s): PROCALCITON, LATICACIDVEN in the last 168 hours.  Recent Results (from the past 240 hour(s))  SARS Coronavirus 2 (CEPHEID - Performed in [place] hospital lab), Hosp Order     Status: None   Collection Time: 04/20/19  5:34 AM  Result Value Ref Range Status   SARS Coronavirus 2 NEGATIVE NEGATIVE Final    Comment: (NOTE) If result is NEGATIVE SARS-CoV-2 target nucleic acids are NOT DETECTED. The SARS-CoV-2 RNA is generally detectable in upper and lower  respiratory specimens during the acute phase of infection. The lowest  concentration of SARS-CoV-2 viral copies this assay can detect is 250  copies / mL. A negative result does not preclude SARS-CoV-2 infection  and should not be used as the sole basis for treatment or other  patient management decisions.  A negative result may occur with  improper specimen collection / handling, submission of specimen  other  than nasopharyngeal swab, presence of viral mutation(s) within the  areas targeted by this assay, and inadequate number of viral copies  (<250 copies / mL). A negative result must be combined with clinical  observations, patient history, and epidemiological information. If result is POSITIVE SARS-CoV-2 target nucleic acids are DETECTED. The SARS-CoV-2 RNA is generally detectable in upper and lower  respiratory specimens dur ing the acute phase of infection.  Positive  results are indicative of active infection with SARS-CoV-2.  Clinical  correlation with patient history and other diagnostic information is  necessary to determine patient infection status.  Positive results do  not rule out bacterial infection or co-infection with other viruses. If result is PRESUMPTIVE POSTIVE SARS-CoV-2 nucleic acids MAY BE PRESENT.   A presumptive positive result was obtained on the submitted specimen  and confirmed on repeat testing.  While 2019 novel coronavirus  (SARS-CoV-2) nucleic acids may be present in the submitted sample  additional confirmatory testing may be necessary for epidemiological  and / or clinical management purposes  to differentiate between  SARS-CoV-2 and other Sarbecovirus currently known to infect humans.  If clinically indicated additional testing with an alternate test  methodology [phone]) is advised. The SARS-CoV-2 RNA is generally  detectable in upper and lower respiratory sp ecimens during the acute  phase of infection. The expected result is Negative. Fact Sheet for Patients:  StrictlyIdeas.no Fact Sheet for Healthcare Providers: BankingDealers.co.za This test is not yet approved or cleared by the [place] FDA and has been authorized for detection and/or diagnosis of SARS-CoV-2 by FDA under an Emergency Use Authorization (EUA).  This EUA will remain in effect (meaning this test can be used) for the duration  of the COVID-19 declaration under Section 564(b)(1) of the Act, 21 U.S.C. section 360bbb-3(b)(1), unless the authorization is terminated or revoked sooner. Performed at [facility], [place] [address], [place], [place] [address]   Surgical pcr screen     Status: None   Collection Time: 04/20/19 10:12 PM  Result Value Ref Range Status   MRSA, PCR NEGATIVE NEGATIVE Final   Staphylococcus aureus NEGATIVE NEGATIVE Final    Comment: (NOTE) The Xpert SA Assay (FDA approved for NASAL specimens in patients 73 years of age and older), is one component of a comprehensive surveillance program. It is not intended to diagnose infection nor to guide or monitor treatment. Performed at [facility], [place] [address], [place], [place] [address]      Radiology Studies: Dg Chest Port 1 View  Result Date: 04/24/2019 CLINICAL DATA:  CHF,  dementia EXAM: PORTABLE CHEST 1 VIEW COMPARISON:  04/20/2019 FINDINGS: There may be some degree of retrocardiac atelectasis. No definite acute airspace abnormality in AP portable projection. Cardiomegaly. IMPRESSION: There may be some degree of retrocardiac atelectasis. No definite acute airspace abnormality in AP portable projection. Cardiomegaly. Electronically Signed   By: Eddie Candle M.D.   On: 04/24/2019 14:35    Scheduled Meds:  docusate sodium  100 mg Oral BID   donepezil  10 mg Oral Daily   enoxaparin (LOVENOX) injection  40 mg Subcutaneous Q24H   famotidine  40 mg Oral BID   fluticasone  2 spray Each Nare Daily   fluticasone furoate-vilanterol  1 puff Inhalation Daily   furosemide  40 mg Intravenous Daily   ipratropium  2 spray Each Nare BID   memantine  10 mg Oral BID   nebivolol  2.5 mg Oral Daily   sertraline  50 mg Oral QHS   Continuous Infusions:  methocarbamol (ROBAXIN) IV       LOS: 5 days   Marylu Lund, MD [facility] Pager On Amion  If 7PM-7AM, please contact night-coverage 04/25/2019, 2:12 PM

## 2019-04-25 NOTE — Progress Notes (Signed)
Physical Therapy Treatment Patient Details Name: Virginia [name] MRN: [id number] DOB: December 10, 1935 Today's Date: 04/25/2019    History of Present Illness Pt is an 83 y.o. female admitted 04/20/19 after unwitnessed fall at home with RLE pain; found to have displaced R intertrochanteric fx. Now s/p R intertrochanteric IM nail 5/21. PMH includes dementia, CHF, CAD, COPD, CKD III, anxiety.    PT Comments    Patient seen to progress mobility. Patient stating need for restroom use, with PT/OT transferring patient to <> from [facility] during session with Max/Total A +2 with patient demonstrating difficulty following simple commands with mobility. Patient confused throughout session with music and low voices utilized to help with calming. PT to continue to recommend SNF at discharge due to current functional status and need for increased physical assist currently. PT to continue to follow.    Follow Up Recommendations  SNF;Supervision/Assistance - 24 hour     Equipment Recommendations  3in1 (PT);Wheelchair (measurements PT);Wheelchair cushion (measurements PT);Hospital bed;Other (comment)(hoyer)    Recommendations for Other Services       Precautions / Restrictions Precautions Precautions: Fall Precaution Comments: risk for skin break down due to prolonged suping Restrictions Weight Bearing Restrictions: Yes RLE Weight Bearing: Weight bearing as tolerated    Mobility  Bed Mobility Overal bed mobility: Needs Assistance Bed Mobility: Supine to Sit;Sit to Supine Rolling: Total assist;+2 for physical assistance   Supine to sit: Total assist;+2 for physical assistance Sit to supine: Total assist;+2 for physical assistance   General bed mobility comments: pt pivot transfer with pad to EOB with total (A) of bil LE in neutral position. Pt static sitting min (A). pt return to supine total +2 total .   Transfers Overall transfer level: Needs assistance Equipment used: Rolling walker (2  wheeled) Transfers: Sit to/from [facility] Sit to Stand: +2 physical assistance;Mod assist Stand pivot transfers: +2 physical assistance;Max assist       General transfer comment: pt requires bed elevated and anterior (A) to weigh shift. Pt needs max (A) to transfer to the [facility].   Ambulation/Gait             General Gait Details: unable   Stairs             Wheelchair Mobility    Modified Rankin (Stroke Patients Only)       Balance Overall balance assessment: History of Falls;Needs assistance Sitting-balance support: Single extremity supported;Feet supported Sitting balance-Leahy Scale: Poor                                      Cognition Arousal/Alertness: Awake/alert Behavior During Therapy: Agitated Overall Cognitive Status: History of cognitive impairments - at baseline                                 General Comments: h/o dementia; pt benefits from calm soft voice and central vision positioning of therapist. Music played this session as a calming tool      Exercises      General Comments General comments (skin integrity, edema, etc.): patient placed in chair position in bed at end of session; encouraged lights on and blinds open during daytime hours      Pertinent Vitals/Pain Pain Assessment: Faces Faces Pain Scale: Hurts whole lot Pain Location: R LE with any tactile input Pain Descriptors / Indicators: Operative site  guarding;Discomfort;Grimacing Pain Intervention(s): Limited activity within patient's tolerance;Monitored during session;Repositioned    Home Living                      Prior Function            PT Goals (current goals can now be found in the care plan section) Acute Rehab PT Goals Patient Stated Goal: to go to the bathroom PT Goal Formulation: With patient/family Time For Goal Achievement: 05/06/19 Potential to Achieve Goals: Fair Progress towards PT goals:  Progressing toward goals    Frequency    Min 3X/week      PT Plan Current plan remains appropriate    Co-evaluation PT/OT/SLP Co-Evaluation/Treatment: Yes Reason for Co-Treatment: Necessary to address cognition/behavior during functional activity;For patient/therapist safety;To address functional/ADL transfers PT goals addressed during session: Mobility/safety with mobility;Balance;Strengthening/ROM;Proper use of DME OT goals addressed during session: ADL's and self-care;Proper use of Adaptive equipment and DME;Strengthening/ROM      AM-PAC PT "6 Clicks" Mobility   Outcome Measure  Help needed turning from your back to your side while in a flat bed without using bedrails?: Total Help needed moving from lying on your back to sitting on the side of a flat bed without using bedrails?: Total Help needed moving to and from a bed to a chair (including a wheelchair)?: Total Help needed standing up from a chair using your arms (e.g., wheelchair or bedside chair)?: Total Help needed to walk in hospital room?: Total Help needed climbing 3-5 steps with a railing? : Total 6 Click Score: 6    End of Session Equipment Utilized During Treatment: Gait belt Activity Tolerance: Patient limited by pain;Treatment limited secondary to agitation Patient left: in bed;with call bell/phone within reach;with bed alarm set Nurse Communication: Mobility status PT Visit Diagnosis: Other abnormalities of gait and mobility (R26.89);Muscle weakness (generalized) (M62.81);Pain Pain - Right/Left: Right Pain - part of body: Hip     Time: 1829-9371 PT Time Calculation (min) (ACUTE ONLY): 23 min  Charges:  $Therapeutic Activity: 8-22 mins                      Lanney Gins, PT, DPT Supplemental Physical Therapist 04/25/19 11:29 AM Pager: [phone] Office: [phone]

## 2019-04-26 DIAGNOSIS — S72001A Fracture of unspecified part of neck of right femur, initial encounter for closed fracture: Secondary | ICD-10-CM

## 2019-04-26 LAB — BASIC METABOLIC PANEL
Anion gap: 11 (ref 5–15)
BUN: 15 mg/dL (ref 8–23)
CO2: 28 mmol/L (ref 22–32)
Calcium: 8.8 mg/dL — ABNORMAL LOW (ref 8.9–10.3)
Chloride: 105 mmol/L (ref 98–111)
Creatinine, Ser: 0.76 mg/dL (ref 0.44–1.00)
GFR calc Af Amer: >60 mL/min (ref >60)
GFR calc non Af Amer: >60 mL/min (ref >60)
Glucose, Bld: 114 mg/dL — ABNORMAL HIGH (ref 70–99)
Potassium: 3.4 mmol/L — ABNORMAL LOW (ref 3.5–5.1)
Sodium: 144 mmol/L (ref 135–145)

## 2019-04-26 MED ORDER — LACTULOSE 10 GM/15ML PO SOLN
20.0000 g | ORAL | Status: DC
  Filled 2019-04-26: qty 30

## 2019-04-26 MED ORDER — DOCUSATE SODIUM 100 MG PO CAPS: 100.0000 mg | ORAL_CAPSULE | Freq: Two times a day (BID) | ORAL | 0 refills | Status: DC

## 2019-04-26 MED ORDER — LACTULOSE 10 GM/15ML PO SOLN: 10.0000 g | ORAL | Status: DC

## 2019-04-26 MED ORDER — FUROSEMIDE 20 MG PO TABS
20.0000 mg | ORAL_TABLET | Freq: Every day | ORAL | Status: DC
  Administered 2019-04-26 – 2019-04-27 (×2): 20 mg via ORAL
  Filled 2019-04-26 (×2): qty 1

## 2019-04-26 MED ORDER — HYDROCODONE-ACETAMINOPHEN 5-325 MG PO TABS: 1.0000 | ORAL_TABLET | Freq: Four times a day (QID) | ORAL | 0 refills | Status: DC | PRN

## 2019-04-26 MED ORDER — ENOXAPARIN SODIUM 40 MG/0.4ML ~~LOC~~ SOLN: 40.0000 mg | SUBCUTANEOUS | 0 refills | Status: DC

## 2019-04-26 MED ORDER — BISACODYL 10 MG RE SUPP
10.0000 mg | Freq: Every day | RECTAL | Status: DC | PRN
  Administered 2019-04-26: 10 mg via RECTAL
  Filled 2019-04-26: qty 1

## 2019-04-26 MED ORDER — MAGNESIUM CITRATE PO SOLN
1.0000 | ORAL | Status: AC
  Administered 2019-04-26: 1 via ORAL
  Filled 2019-04-26: qty 296

## 2019-04-26 NOTE — Discharge Summary (Signed)
Physician Discharge Summary  Virginia [name]:[id number] DOB: [birth year] DOA: 04/20/2019  PCP: Virginia Chandler, NP  Admit date: 04/20/2019 Discharge date: 04/26/2019  Admitted From: Home Disposition:  Home  Recommendations for Outpatient Follow-up:  1. Follow up with PCP in 1-2 weeks 2. Follow up with Orthopedic Surgery as scheduled  Home Health:PT   Discharge Condition:Stable CODE STATUS:Full Diet recommendation: Regular   Brief/Interim Summary: 83 y.o.femalewith medical history significant ofCHF, CAD,left carotid artery stenosiss/penterectomy,COPD, CKD stage III dementia, and anxiety; who presents after having a unwitnessed fall at home. Patient has a history of dementia and history is obtained from [name] brother who is present at bedside. Apparently around 2:30 AM this morning she was found outside [name] door complaining of pain in [name] back and right hip. Unclear if the patient had any loss of consciousness normally patient is cared for by [name] son and brother at home. She utilizes a walker intermittently to ambulate, but appears not to have been using it this morning. It was abnormal for [name] to get up in the middle of the night as she had normally donethis. Patient was significantly anxious and agitated on arrival which is unusual for [name] and brother attributed this to pain  Discharge Diagnoses:  Principal Problem:   Closed intertrochanteric fracture of hip, right, initial encounter [facility]) Active Problems:   Essential hypertension   COPD GOLD IV   Dementia with behavioral disturbance (HCC)   Anxiety   Gastroesophageal reflux disease   Leukocytosis   Fall at home, initial encounter   Displaced intertrochanteric fracture of right femur ([place])  Fall at home with closed rightintertrochantericfractureof right hip, initial encounter: Acute. Patient had unwitnessed fall at home complaining of right hip pain. X-rays revealed displaced intertrochanteric fracture on the  right.  -Orthopedic Surgery following -Pt underwent surgery 5/21 -PT following. Home with home health anticipated. CM following. Discussed with family at bedside.  -Medically stable and ready for discharge today. Pt originally medically ready for d/c 5/25, however family had requested one more hospital day for medical equipment to be delivered  Leukocytosis: Acute. WBC mildly elevated at 10.7. Chest x-ray was otherwise clear and with suspect of a white blood cell count related with acute fracture. -UA pos for nitrites, leuk -.Remains afebrile and appears stable -Will cont to hold antimicrobial . Remains stable at present  Essential hypertension: Blood pressures elevated up to 187/87 at time of presentation. Suspect multifactorial including acute pain. -Continue Bystolic and furosemide as tolerated -Continue hydralazine IV as needed for elevated blood pressure -BP presently stable  COPD, without acute exacerbation: Stable.Chest x-ray revealing cardiomegaly without signs of edema or infiltrate. -Continue home regimen of Breo and Duonebs -Remains on room air  CKD stage III: Creatinine 1.10 on admission. Overall kidney function appears better than previous seen.  Dementia: Patient at baseline able to recognize family members. -Continued on Namenda and donepezil -Family remains at bedside per visitation policy  Anxiety: Patient was noted to be very anxious and agitated on arrival. Normally takes Zoloft for anxiety. -Continue Zoloft as tolerated -cont on Ativan 0.5 mg IV PRN as tolerated   GERD -Continue Pepcid and Protonix as tolerated -Stable currently  Acute on chronic diastolic CHF exacerbation -Patient had been on lasix prior to admit, had missed several doses in hospital -Pt remains on O2, noted to be difficult to wean -Will transition lasix to IV. Thus far, pt able to wean from O2 with good urine output -Weaned to room air   Discharge  Instructions  Allergies as of 04/26/2019      Reactions   Lidocaine Anaphylaxis, Swelling, Rash, Other (See Comments)   Any of the " [name] "   Other Other (See Comments)   All drugs that end in "cane'-  novacane etc. Daughter states patient almost died when she had it when she was born      Medication List    TAKE these medications   albuterol (2.5 MG/3ML) 0.083% nebulizer solution Commonly known as:  PROVENTIL Take 6 mLs (5 mg total) by nebulization every 4 (four) hours as needed for wheezing or shortness of breath.   ARTIFICIAL TEAR OP Place 2 drops into both eyes daily.   Carbinoxamine Maleate 4 MG Tabs Take 1 tablet (4 mg total) by mouth every 8 (eight) hours as needed. What changed:  reasons to take this   Cholecalciferol 50 MCG (2000 UT) Caps Commonly known as:  D3 Super Strength Take 1 capsule (2,000 Units total) by mouth daily.   diclofenac sodium 1 % Gel Commonly known as:  VOLTAREN APPLY TO AFFECTED AREA TWICE A DAY IF NEEDED FOR SHOULDER AND KNEE PAIN What changed:  See the new instructions.   docusate sodium 100 MG capsule Commonly known as:  COLACE Take 1 capsule (100 mg total) by mouth 2 (two) times daily.   donepezil 10 MG tablet Commonly known as:  ARICEPT TAKE 1 TABLET BY MOUTH EVERY DAY What changed:    how much to take  how to take this  when to take this  additional instructions   enoxaparin 40 MG/0.4ML injection Commonly known as:  LOVENOX Inject 0.4 mLs (40 mg total) into the skin daily. Start taking on:  Apr 27, 2019   famotidine 40 MG tablet Commonly known as:  PEPCID Take 40 mg by mouth 2 (two) times daily.   fluticasone 50 MCG/ACT nasal spray Commonly known as:  FLONASE USE 2 SPRAYS IN EACH NOSTRIL DAILY   fluticasone furoate-vilanterol 100-25 MCG/INH Aepb Commonly known as:  Breo Ellipta Inhale 1 puff into the lungs daily.   furosemide 20 MG tablet Commonly known as:  LASIX Take 1 tablet (20 mg total) by mouth  daily.   HYDROcodone-acetaminophen 5-325 MG tablet Commonly known as:  NORCO/VICODIN Take 1-2 tablets by mouth every 6 (six) hours as needed for moderate pain. Notes to patient:  Last given 5/26 at 0954   ipratropium 0.06 % nasal spray Commonly known as:  ATROVENT USE 2 SPRAYS IN EACH NOSTRIL TWICE DAILY What changed:  when to take this   ipratropium-albuterol 0.5-2.5 (3) MG/3ML Soln Commonly known as:  DUONEB inhale contents of 1 vial in nebulizer every 6 hours What changed:  See the new instructions.   memantine 10 MG tablet Commonly known as:  Namenda Take 1 tablet (10 mg total) by mouth 2 (two) times daily.   nebivolol 5 MG tablet Commonly known as:  Bystolic TAKE 0.5 TABLET BY MOUTH EVERY DAY. HOLD A DOSE IF SYSTOLIC BLOOD PRESSURE LESS THAN 105. IF SYSTOLIC BLOOD PRESSURE OVER 150, GIVE 1 WHOLE What changed:    how much to take  how to take this  when to take this  additional instructions   ondansetron 4 MG disintegrating tablet Commonly known as:  ZOFRAN-ODT DISSOLVE 1 TABLET ON THE TONGUE IN THE MORNING What changed:    how much to take  how to take this  when to take this  additional instructions   pantoprazole 40 MG tablet Commonly known as:  PROTONIX  TAKE 1 TABLET BY MOUTH ONCE DAILY   sertraline 50 MG tablet Commonly known as:  ZOLOFT 1 TABLET DAILY What changed:    how much to take  how to take this  when to take this  additional instructions            Durable Medical Equipment  (From admission, onward)         Start     Ordered   04/24/19 1537  For home use only DME 3 n 1  Once    Comments:  Length of need: lifetime   04/24/19 1537   04/24/19 1419  For home use only DME Hospital bed  Once    Question Answer Comment  Length of Need Lifetime   Patient has (list medical condition): displaced R intertrochanteric fx. CHF   The above medical condition requires: Patient requires the ability to reposition frequently   Head must  be elevated greater than: 30 degrees   Bed type Semi-electric   Hoyer Lift Yes   Support Surface: Gel Overlay      04/24/19 1418   04/24/19 1415  For home use only DME lightweight manual wheelchair with seat cushion  Once    Comments:  Patient suffers from displaced R intertrochanteric fx, CHF which impairs their ability to perform daily activities like bathing, cooking, dressing, toileting in the home.  A walker will not resolve  issue with performing activities of daily living. A wheelchair will allow patient to safely perform daily activities. Patient is not able to propel themselves in the home using a standard weight wheelchair due to weakness, shortness of breath. Patient can self propel in the lightweight wheelchair. Length of need 99 months. Accessories: elevating leg rests (ELRs), wheel locks, extensions and anti-tippers, back cushion.   04/24/19 1418         Follow-up Information    Swinteck, Aaron Edelman, MD. Schedule an appointment as soon as possible for a visit in 2 weeks.   Specialty:  Orthopedic Surgery Why:  For wound re-check Contact information: [address] [place] [address] [phone]        Virginia Chandler, NP. Schedule an appointment as soon as possible for a visit in 2 week(s).   Specialty:  Geriatric Medicine Contact information: [place]. [place] [address] [phone]        Fay Records, MD .   Specialty:  Cardiology Contact information: [address] [place] [address] [phone]          Allergies  Allergen Reactions  . Lidocaine Anaphylaxis, Swelling, Rash and Other (See Comments)    Any of the " [facility] "  . Other Other (See Comments)    All drugs that end in "cane'-  novacane etc. Daughter states patient almost died when she had it when she was born    Consultations:  Orthopedic Surgery  Procedures/Studies: Dg Chest 1 View  Result Date: 04/20/2019 CLINICAL DATA:   Fall EXAM: CHEST  1 VIEW COMPARISON:  02/02/2017 FINDINGS: Chronic cardiomegaly. Stable mediastinal contours. Interstitial coarsening that is stable. No visible acute fracture. There has been remote fracturing of the proximal right humerus. IMPRESSION: Stable, no evidence of acute cardiopulmonary disease. Electronically Signed   By: Monte Fantasia M.D.   On: 04/20/2019 05:41   Dg Tibia/fibula Right  Result Date: 04/20/2019 CLINICAL DATA:  Fall with leg pain EXAM: RIGHT TIBIA AND FIBULA - 2 VIEW COMPARISON:  None. FINDINGS: No  evidence of acute fracture or dislocation. There is knee osteoarthritis with medial compartment joint narrowing and spurring. IMPRESSION: 1. No acute finding. 2. Knee osteoarthritis with medial compartment collapse. Electronically Signed   By: Monte Fantasia M.D.   On: 04/20/2019 05:43   Pelvis Portable  Result Date: 04/21/2019 CLINICAL DATA:  Fracture EXAM: PORTABLE PELVIS 1-2 VIEWS COMPARISON:  04/20/2019 FINDINGS: The patient is status post ORIF of the right femur. The alignment is improved. Multiple displaced fracture fragments are again noted. Extensive subcutaneous gas and overlying soft tissue edema is noted. There is diffuse osteopenia. There are degenerative changes of the left hip IMPRESSION: Status post ORIF of the right femur with improved osseous alignment. Electronically Signed   By: Constance Holster M.D.   On: 04/21/2019 14:27   Dg Chest Port 1 View  Result Date: 04/24/2019 CLINICAL DATA:  CHF, dementia EXAM: PORTABLE CHEST 1 VIEW COMPARISON:  04/20/2019 FINDINGS: There may be some degree of retrocardiac atelectasis. No definite acute airspace abnormality in AP portable projection. Cardiomegaly. IMPRESSION: There may be some degree of retrocardiac atelectasis. No definite acute airspace abnormality in AP portable projection. Cardiomegaly. Electronically Signed   By: Eddie Candle M.D.   On: 04/24/2019 14:35   Dg C-arm 1-60 Min  Result Date: 04/21/2019 CLINICAL  DATA:  Postop. EXAM: DG C-ARM 61-120 MIN; RIGHT FEMUR 2 VIEWS COMPARISON:  Plain films 04/20/2019 FINDINGS: Intraoperative spot films demonstrate intramedullary rod and screw placement across an intertrochanteric fracture. IMPRESSION: As above. Improved position and alignment. Electronically Signed   By: Staci Righter M.D.   On: 04/21/2019 12:35   Dg Hip Unilat With Pelvis 2-3 Views Right  Result Date: 04/20/2019 CLINICAL DATA:  Fall EXAM: DG HIP (WITH OR WITHOUT PELVIS) 2-3V RIGHT COMPARISON:  None. FINDINGS: Comminuted intertrochanteric right femur fracture with varus angulation and lesser trochanter retraction. Osteopenia. No hip dislocation. IMPRESSION: Displaced intertrochanteric femur fracture on the right. Electronically Signed   By: Monte Fantasia M.D.   On: 04/20/2019 05:42   Dg Femur, Min 2 Views Right  Result Date: 04/21/2019 CLINICAL DATA:  Postop. EXAM: DG C-ARM 61-120 MIN; RIGHT FEMUR 2 VIEWS COMPARISON:  Plain films 04/20/2019 FINDINGS: Intraoperative spot films demonstrate intramedullary rod and screw placement across an intertrochanteric fracture. IMPRESSION: As above. Improved position and alignment. Electronically Signed   By: Staci Righter M.D.   On: 04/21/2019 12:35   Dg Femur [place], [place] 2 Views Right  Result Date: 04/21/2019 CLINICAL DATA:  Fracture EXAM: RIGHT FEMUR PORTABLE 2 VIEW COMPARISON:  04/20/2019 FINDINGS: The patient is now status post ORIF of the right femur. The hardware is intact. The alignment is improved. There are expected postsurgical changes including subcutaneous gas and overlying soft tissue edema. Vascular calcifications are noted. There are advanced degenerative changes of the right knee. There is no significant right knee joint effusion. IMPRESSION: Status post ORIF of the right femur with improved osseous alignment. Electronically Signed   By: Constance Holster M.D.   On: 04/21/2019 14:28   Dg Knee 3 View Right  Result Date: 04/20/2019 CLINICAL DATA:   Hip fracture. EXAM: RIGHT KNEE - 3 VIEW COMPARISON:  09/19/2016 FINDINGS: No joint effusion. There is marked medial compartment joint space narrowing with marginal spur formation. Sharpening of the tibial spines noted. No fracture or dislocation. IMPRESSION: 1. Osteoarthritis with marked medial compartment narrowing. Electronically Signed   By: Kerby Moors M.D.   On: 04/20/2019 13:16    Subjective: Without complaints  Discharge Exam: Vitals:   04/25/19  2100 04/25/19 2200  BP: (!) 142/76   Pulse: (!) 134 100  Resp: 18   Temp: 98.7 F (37.1 C)   SpO2: 94%    Vitals:   04/25/19 0703 04/25/19 0833 04/25/19 2100 04/25/19 2200  BP: (!) 141/77  (!) 142/76   Pulse: (!) 114  (!) 134 100  Resp: 18  18   Temp: 97.9 F (36.6 C)  98.7 F (37.1 C)   TempSrc: Axillary  Axillary   SpO2: 96% 92% 94%   Weight:      Height:        General: Pt is alert, awake, not in acute distress Cardiovascular: RRR, S1/S2 +, no rubs, no gallops Respiratory: CTA bilaterally, no wheezing, no rhonchi Abdominal: Soft, NT, ND, bowel sounds + Extremities: no edema, no cyanosis   The results of significant diagnostics from this hospitalization (including imaging, microbiology, ancillary and laboratory) are listed below for reference.     Microbiology: Recent Results (from the past 240 hour(s))  SARS Coronavirus 2 (CEPHEID - Performed in [place] hospital lab), Hosp Order     Status: None   Collection Time: 04/20/19  5:34 AM  Result Value Ref Range Status   SARS Coronavirus 2 NEGATIVE NEGATIVE Final    Comment: (NOTE) If result is NEGATIVE SARS-CoV-2 target nucleic acids are NOT DETECTED. The SARS-CoV-2 RNA is generally detectable in upper and lower  respiratory specimens during the acute phase of infection. The lowest  concentration of SARS-CoV-2 viral copies this assay can detect is 250  copies / mL. A negative result does not preclude SARS-CoV-2 infection  and should not be used as the sole basis  for treatment or other  patient management decisions.  A negative result may occur with  improper specimen collection / handling, submission of specimen other  than nasopharyngeal swab, presence of viral mutation(s) within the  areas targeted by this assay, and inadequate number of viral copies  (<250 copies / mL). A negative result must be combined with clinical  observations, patient history, and epidemiological information. If result is POSITIVE SARS-CoV-2 target nucleic acids are DETECTED. The SARS-CoV-2 RNA is generally detectable in upper and lower  respiratory specimens dur ing the acute phase of infection.  Positive  results are indicative of active infection with SARS-CoV-2.  Clinical  correlation with patient history and other diagnostic information is  necessary to determine patient infection status.  Positive results do  not rule out bacterial infection or co-infection with other viruses. If result is PRESUMPTIVE POSTIVE SARS-CoV-2 nucleic acids MAY BE PRESENT.   A presumptive positive result was obtained on the submitted specimen  and confirmed on repeat testing.  While 2019 novel coronavirus  (SARS-CoV-2) nucleic acids may be present in the submitted sample  additional confirmatory testing may be necessary for epidemiological  and / or clinical management purposes  to differentiate between  SARS-CoV-2 and other Sarbecovirus currently known to infect humans.  If clinically indicated additional testing with an alternate test  methodology [phone]) is advised. The SARS-CoV-2 RNA is generally  detectable in upper and lower respiratory sp ecimens during the acute  phase of infection. The expected result is Negative. Fact Sheet for Patients:  StrictlyIdeas.no Fact Sheet for Healthcare Providers: BankingDealers.co.za This test is not yet approved or cleared by the [place] FDA and has been authorized for detection and/or  diagnosis of SARS-CoV-2 by FDA under an Emergency Use Authorization (EUA).  This EUA will remain in effect (meaning this test can be used)  for the duration of the COVID-19 declaration under Section 564(b)(1) of the Act, 21 U.S.C. section 360bbb-3(b)(1), unless the authorization is terminated or revoked sooner. Performed at [facility], [place] [address], [place], [place] [address]   Surgical pcr screen     Status: None   Collection Time: 04/20/19 10:12 PM  Result Value Ref Range Status   MRSA, PCR NEGATIVE NEGATIVE Final   Staphylococcus aureus NEGATIVE NEGATIVE Final    Comment: (NOTE) The Xpert SA Assay (FDA approved for NASAL specimens in patients 67 years of age and older), is one component of a comprehensive surveillance program. It is not intended to diagnose infection nor to guide or monitor treatment. Performed at [facility], [place] [address], [place], [place] [address]      Labs: BNP (last 3 results) No results for input(s): BNP in the last 8760 hours. Basic Metabolic Panel: Recent Labs  Lab 04/20/19 0456 04/21/19 0747 04/22/19 0112 04/25/19 1110 04/26/19 1045  NA 142 143 142 142 144  K 4.1 4.6 4.2 3.6 3.4*  CL 106 107 107 104 105  CO2 27 25 26 27 28   GLUCOSE 145* 115* 127* 117* 114*  BUN 14 12 14 18 15   CREATININE 1.10* 0.98 0.94 0.74 0.76  CALCIUM 9.0 9.0 8.4* 8.5* 8.8*   Liver Function Tests: No results for input(s): AST, ALT, ALKPHOS, BILITOT, PROT, ALBUMIN in the last 168 hours. No results for input(s): LIPASE, AMYLASE in the last 168 hours. No results for input(s): AMMONIA in the last 168 hours. CBC: Recent Labs  Lab 04/20/19 0456 04/21/19 0747 04/22/19 0112 04/25/19 1110  WBC 10.7* 10.4 9.9 8.3  NEUTROABS 8.0*  --   --   --   HGB 13.6 12.3 10.0* 9.2*  HCT 43.2 40.0 31.7* 28.9*  MCV 94.3 96.2 94.3 93.2  PLT 234 185 191 230   Cardiac Enzymes: No results for input(s): CKTOTAL, CKMB, CKMBINDEX, TROPONINI in the last 168  hours. BNP: Invalid input(s): POCBNP CBG: No results for input(s): GLUCAP in the last 168 hours. D-Dimer No results for input(s): DDIMER in the last 72 hours. Hgb A1c No results for input(s): HGBA1C in the last 72 hours. Lipid Profile No results for input(s): CHOL, HDL, LDLCALC, TRIG, CHOLHDL, LDLDIRECT in the last 72 hours. Thyroid function studies No results for input(s): TSH, T4TOTAL, T3FREE, THYROIDAB in the last 72 hours.  Invalid input(s): FREET3 Anemia work up No results for input(s): VITAMINB12, FOLATE, FERRITIN, TIBC, IRON, RETICCTPCT in the last 72 hours. Urinalysis    Component Value Date/Time   COLORURINE YELLOW 04/21/2019 0205   APPEARANCEUR CLOUDY (A) 04/21/2019 0205   LABSPEC 1.021 04/21/2019 0205   PHURINE 5.0 04/21/2019 0205   GLUCOSEU NEGATIVE 04/21/2019 0205   HGBUR NEGATIVE 04/21/2019 0205   BILIRUBINUR NEGATIVE 04/21/2019 0205   BILIRUBINUR neg 12/29/2017 1439   KETONESUR 5 (A) 04/21/2019 0205   PROTEINUR NEGATIVE 04/21/2019 0205   UROBILINOGEN 0.2 06/14/2018 1537   NITRITE POSITIVE (A) 04/21/2019 0205   LEUKOCYTESUR TRACE (A) 04/21/2019 0205   Sepsis Labs Invalid input(s): PROCALCITONIN,  WBC,  LACTICIDVEN Microbiology Recent Results (from the past 240 hour(s))  SARS Coronavirus 2 (CEPHEID - Performed in [place] hospital lab), Hosp Order     Status: None   Collection Time: 04/20/19  5:34 AM  Result Value Ref Range Status   SARS Coronavirus 2 NEGATIVE NEGATIVE Final    Comment: (NOTE) If result is NEGATIVE SARS-CoV-2 target nucleic acids are NOT DETECTED. The SARS-CoV-2  RNA is generally detectable in upper and lower  respiratory specimens during the acute phase of infection. The lowest  concentration of SARS-CoV-2 viral copies this assay can detect is 250  copies / mL. A negative result does not preclude SARS-CoV-2 infection  and should not be used as the sole basis for treatment or other  patient management decisions.  A negative result may  occur with  improper specimen collection / handling, submission of specimen other  than nasopharyngeal swab, presence of viral mutation(s) within the  areas targeted by this assay, and inadequate number of viral copies  (<250 copies / mL). A negative result must be combined with clinical  observations, patient history, and epidemiological information. If result is POSITIVE SARS-CoV-2 target nucleic acids are DETECTED. The SARS-CoV-2 RNA is generally detectable in upper and lower  respiratory specimens dur ing the acute phase of infection.  Positive  results are indicative of active infection with SARS-CoV-2.  Clinical  correlation with patient history and other diagnostic information is  necessary to determine patient infection status.  Positive results do  not rule out bacterial infection or co-infection with other viruses. If result is PRESUMPTIVE POSTIVE SARS-CoV-2 nucleic acids MAY BE PRESENT.   A presumptive positive result was obtained on the submitted specimen  and confirmed on repeat testing.  While 2019 novel coronavirus  (SARS-CoV-2) nucleic acids may be present in the submitted sample  additional confirmatory testing may be necessary for epidemiological  and / or clinical management purposes  to differentiate between  SARS-CoV-2 and other Sarbecovirus currently known to infect humans.  If clinically indicated additional testing with an alternate test  methodology [phone]) is advised. The SARS-CoV-2 RNA is generally  detectable in upper and lower respiratory sp ecimens during the acute  phase of infection. The expected result is Negative. Fact Sheet for Patients:  StrictlyIdeas.no Fact Sheet for Healthcare Providers: BankingDealers.co.za This test is not yet approved or cleared by the [place] FDA and has been authorized for detection and/or diagnosis of SARS-CoV-2 by FDA under an Emergency Use Authorization (EUA).  This  EUA will remain in effect (meaning this test can be used) for the duration of the COVID-19 declaration under Section 564(b)(1) of the Act, 21 U.S.C. section 360bbb-3(b)(1), unless the authorization is terminated or revoked sooner. Performed at [facility], [place] [address] [place], [place] [address]   Surgical pcr screen     Status: None   Collection Time: 04/20/19 10:12 PM  Result Value Ref Range Status   MRSA, PCR NEGATIVE NEGATIVE Final   Staphylococcus aureus NEGATIVE NEGATIVE Final    Comment: (NOTE) The Xpert SA Assay (FDA approved for NASAL specimens in patients 93 years of age and older), is one component of a comprehensive surveillance program. It is not intended to diagnose infection nor to guide or monitor treatment. Performed at [facility], [place] [address] [place],  [address]    Time spent: 30 min  SIGNED:   Marylu Lund, MD  [facility] 04/26/2019, 4:32 PM  If 7PM-7AM, please contact night-coverage

## 2019-04-26 NOTE — Care Management (Addendum)
[place] [address] [phone] ext [phone] left message, awaiting call back.  Called [phone] spoke to representative she will email delivery department asking them to son Charlett Nose for delivery today. According to [employer] [name] cancelled delivery yesterday , so it is up to him to reschedule.   Called [name] [phone] , he states he did not cancel delivery yesterday , [employer] did not show. Bobby Rumpf [employer] number [phone] [place] [phone] he will call.    Patient's son [name] [phone] has not heard from [employer] with a rescheduled delivery time. NCM called Zack with [employer] given [phone] ext [phone] , called and left message.  Magdalen Spatz RN BSN [phone]

## 2019-04-26 NOTE — Patient Care Conference (Signed)
Met again with patient's brother at bedside. Very good results with mg citrate and dulcolax suppository this afternoon noted. Patient's brother states none of equipment was ever delivered even though pt was planned for d/c 5/25 initially. Family is aware that pt had been medically ready for discharge as early as 5/25. Family desires to keep pt another night secondary to medical equipment at home, aware that there is a possibility that insurance may not cover these additional hospital days. Will now plan d/c 5/27. Family agrees

## 2019-04-26 NOTE — TOC Transition Note (Addendum)
Transition of Care [facility]) - CM/SW Discharge Note   Patient Details  Name: Virginia [name] MRN: [id number] Date of Birth: Mar 06, 1936  Transition of Care [facility]) CM/SW Contact:  Marilu Favre, RN Phone Number: 04/26/2019, 10:10 AM   Clinical Narrative:    Discharge planned for today. Spoke to patient's brother [name] at bedside. Patient's son [name] is at home and ready for DME To be delivered.   Spoke with Zack with [employer] he will check on delivery.   Called daughter [name] [phone] , answered multiple questions, she is requesting a purewick explained we do not order those for home. She is requesting bedside nurse to send bed pan home with her mother.   Once DME delivered will call PTAR.   Cory with [facility] aware discharge is today.     Final next level of care: [place] Barriers to Discharge: No Barriers Identified   Patient Goals and CMS Choice Patient states their goals for this hospitalization and ongoing recovery are:: for her to be safe and cared for CMS Medicare.gov Compare Post Acute Care list provided to:: Patient Represenative (must comment) Choice offered to / list presented to : Adult Children  Discharge Placement                       Discharge Plan and Services In-house Referral: Clinical Social Work Discharge Planning Services: CM Consult Post Acute Care Choice: Home Health          DME Arranged: 3-N-1, Hospital bed, Gel overlay, Lightweight manual wheelchair with seat cushion(Hoyer lift) DME Agency: [employer] Date DME Agency Contacted: 04/24/19 Time DME Agency Contacted: 1520 Representative spoke with at DME Agency: [place] Arranged: RN, PT, OT, Nurse's Aide [place] Agency: [place] Date [place]: 04/24/19 Time [place]: 1330 Representative spoke with at [place]: [place] ([place]) Interventions     Readmission Risk Interventions No  flowsheet data found.

## 2019-04-27 MED ORDER — AMLODIPINE BESYLATE 5 MG PO TABS
5.0000 mg | ORAL_TABLET | Freq: Every day | ORAL | Status: DC
  Administered 2019-04-27: 11:00:00 5 mg via ORAL
  Filled 2019-04-27: qty 1

## 2019-04-27 MED ORDER — AZITHROMYCIN 250 MG PO TABS: 250.0000 mg | ORAL_TABLET | Freq: Every day | ORAL | Status: DC

## 2019-04-27 MED ORDER — AZITHROMYCIN 250 MG PO TABS: ORAL_TABLET | ORAL | 0 refills | Status: DC

## 2019-04-27 MED ORDER — GUAIFENESIN ER 600 MG PO TB12: 1200.0000 mg | ORAL_TABLET | Freq: Two times a day (BID) | ORAL | 0 refills | Status: AC

## 2019-04-27 MED ORDER — AMLODIPINE BESYLATE 5 MG PO TABS: 5.0000 mg | ORAL_TABLET | Freq: Every day | ORAL | 0 refills | Status: DC

## 2019-04-27 MED ORDER — AZITHROMYCIN 250 MG PO TABS
500.0000 mg | ORAL_TABLET | Freq: Every day | ORAL | Status: AC
  Administered 2019-04-27: 500 mg via ORAL
  Filled 2019-04-27: qty 2

## 2019-04-27 MED ORDER — ACETAMINOPHEN 325 MG PO TABS
650.0000 mg | ORAL_TABLET | Freq: Four times a day (QID) | ORAL | Status: DC | PRN
  Administered 2019-04-27: 650 mg via ORAL
  Filled 2019-04-27: qty 2

## 2019-04-27 MED ORDER — GUAIFENESIN ER 600 MG PO TB12
1200.0000 mg | ORAL_TABLET | Freq: Two times a day (BID) | ORAL | Status: DC
  Administered 2019-04-27: 11:00:00 1200 mg via ORAL
  Filled 2019-04-27: qty 2

## 2019-04-27 NOTE — Discharge Instructions (Signed)
Cough, Adult  A cough helps to clear your throat and lungs. A cough may last only 2-3 weeks (acute), or it may last longer than 8 weeks (chronic). Many different things can cause a cough. A cough may be a sign of an illness or another medical condition. Follow these instructions at home:  Pay attention to any changes in your cough.  Take medicines only as told by your doctor. ? If you were prescribed an antibiotic medicine, take it as told by your doctor. Do not stop taking it even if you start to feel better. ? Talk with your doctor before you try using a cough medicine.  Drink enough fluid to keep your pee (urine) clear or pale yellow.  If the air is dry, use a cold steam vaporizer or humidifier in your home.  Stay away from things that make you cough at work or at home.  If your cough is worse at night, try using extra pillows to raise your head up higher while you sleep.  Do not smoke, and try not to be around smoke. If you need help quitting, ask your doctor.  Do not have caffeine.  Do not drink alcohol.  Rest as needed. Contact a doctor if:  You have new problems (symptoms).  You cough up yellow fluid (pus).  Your cough does not get better after 2-3 weeks, or your cough gets worse.  Medicine does not help your cough and you are not sleeping well.  You have pain that gets worse or pain that is not helped with medicine.  You have a fever.  You are losing weight and you do not know why.  You have night sweats. Get help right away if:  You cough up blood.  You have trouble breathing.  Your heartbeat is very fast. This information is not intended to replace advice given to you by your health care provider. Make sure you discuss any questions you have with your health care provider. Document Released: 07/31/2011 Document Revised: 04/24/2016 Document Reviewed: 01/24/2015 Elsevier Interactive Patient Education  2019 Elsevier Inc.   Dr. Rod Can Adult  [facility] [address], [place], [place] [address] [phone]   POSTOPERATIVE DIRECTIONS    Hip Rehabilitation, Guidelines Following Surgery   WEIGHT BEARING Weight bearing as tolerated with assist device (walker, cane, etc) as directed, use it as long as suggested by your surgeon or therapist, typically at least 4-6 weeks.   HOME CARE INSTRUCTIONS  Remove items at home which could result in a fall. This includes throw rugs or furniture in walking pathways.  Continue medications as instructed at time of discharge.  You may have some home medications which will be placed on hold until you complete the course of blood thinner medication.  4 days after discharge, you may start showering. No tub baths or soaking your incisions. Do not put on socks or shoes without following the instructions of your caregivers.   Sit on chairs with arms. Use the chair arms to help push yourself up when arising.  Arrange for the use of a toilet seat elevator so you are not sitting low.   Walk with walker as instructed.  You may resume a sexual relationship in one month or when given the OK by your caregiver.  Use walker as long as suggested by your caregivers.  Avoid periods of inactivity such as sitting longer than an hour when not asleep. This helps prevent blood clots.  You may return to work once you are cleared by Engineer, production.  Do not drive a car for 6 weeks or until released by your surgeon.  Do not drive while taking narcotics.  Wear elastic stockings for two weeks following surgery during the day but you may remove then at night.  Make sure you keep all of your appointments after your operation with all of your doctors and caregivers. You should call the office at the above phone number and make an appointment for approximately two weeks after the date of your surgery. Please pick up a stool softener and laxative for home use as long as you are  requiring pain medications.  ICE to the affected hip every three hours for 30 minutes at a time and then as needed for pain and swelling. Continue to use ice on the hip for pain and swelling from surgery. You may notice swelling that will progress down to the foot and ankle.  This is normal after surgery.  Elevate the leg when you are not up walking on it.   It is important for you to complete the blood thinner medication as prescribed by your doctor.  Continue to use the breathing machine which will help keep your temperature down.  It is common for your temperature to cycle up and down following surgery, especially at night when you are not up moving around and exerting yourself.  The breathing machine keeps your lungs expanded and your temperature down.  RANGE OF MOTION AND STRENGTHENING EXERCISES  These exercises are designed to help you keep full movement of your hip joint. Follow your caregiver's or physical therapist's instructions. Perform all exercises about fifteen times, three times per day or as directed. Exercise both hips, even if you have had only one joint replacement. These exercises can be done on a training (exercise) mat, on the floor, on a table or on a bed. Use whatever works the best and is most comfortable for you. Use music or television while you are exercising so that the exercises are a pleasant break in your day. This will make your life better with the exercises acting as a break in routine you can look forward to.  Lying on your back, slowly slide your foot toward your buttocks, raising your knee up off the floor. Then slowly slide your foot back down until your leg is straight again.  Lying on your back spread your legs as far apart as you can without causing discomfort.  Lying on your side, raise your upper leg and foot straight up from the floor as far as is comfortable. Slowly lower the leg and repeat.  Lying on your back, tighten up the muscle in the front of your  thigh (quadriceps muscles). You can do this by keeping your leg straight and trying to raise your heel off the floor. This helps strengthen the largest muscle supporting your knee.  Lying on your back, tighten up the muscles of your buttocks both with the legs straight and with the knee bent at a comfortable angle while keeping your heel on the floor.   SKILLED REHAB INSTRUCTIONS: If the patient is transferred to a skilled rehab facility following release from the hospital, a list of the current medications will be sent to the facility for the patient to continue.  When discharged from the skilled rehab facility, please have the facility set up the patient's [place] prior to being released. Also, the skilled facility will be responsible  for providing the patient with their medications at time of release from the facility to include their pain medication and their blood thinner medication. If the patient is still at the rehab facility at time of the two week follow up appointment, the skilled rehab facility will also need to assist the patient in arranging follow up appointment in our office and any transportation needs.  MAKE SURE YOU:  Understand these instructions.  Will watch your condition.  Will get help right away if you are not doing well or get worse.  Pick up stool softner and laxative for home use following surgery while on pain medications. Daily dry dressing changes as needed. In 4 days, you may remove your dressings and begin taking showers - no tub baths or soaking the incisions. Continue to use ice for pain and swelling after surgery. Do not use any lotions or creams on the incision until instructed by your surgeon.

## 2019-04-27 NOTE — Progress Notes (Signed)
Physical Therapy Treatment Patient Details Name: Virginia [name] MRN: [id number] DOB: 09/21/1936 Today's Date: 04/27/2019    History of Present Illness Pt is an 83 y.o. female admitted 04/20/19 after unwitnessed fall at home with RLE pain; found to have displaced R intertrochanteric fx. Now s/p R intertrochanteric IM nail 5/21. PMH includes dementia, CHF, CAD, COPD, CKD III, anxiety.    PT Comments    Pt supine in bed on arrival with her brother at the bedside.  She continues to be agitated with commands from PTA but brother able to redirect patient and assist with achieving functional movement.  Pt performed rolling to R and L side for pericare before sitting edge of bed.  She was able to sit x 10 min with B UE support and minor posterior lean.  She required max +2 to achieve sitting edge of bed.  Due to patient's fear of falling utilized sara stedy to achieve standing.  Upon standing patient began to have additional BM and was transferred to [facility].  From [facility] she required max +2 to achieve standing.  Pt stood to receive pericare then transferred to recliner chair with legs elevated.  RN to give tylenol post tx.  Pt continues to benefit from SNF placement but family is refusing and patient will require maximal HH services listed below.  Will continue to follow acutely during hospitalization.      Follow Up Recommendations  SNF;Supervision/Assistance - 24 hour(Family refusing SNF so patient will require HHPT, aide, and RN.  )     Equipment Recommendations  3in1 (PT);Wheelchair (measurements PT);Wheelchair cushion (measurements PT);Hospital bed;Other (comment)(hoyer to transfer into WC.  ) WC must have elevating leg rests.    Recommendations for Other Services       Precautions / Restrictions Precautions Precautions: Fall Precaution Comments: risk for skin break down due to prolonged suping Restrictions Weight Bearing Restrictions: Yes RLE Weight Bearing: Weight bearing as tolerated     Mobility  Bed Mobility Overal bed mobility: Needs Assistance Bed Mobility: Supine to Sit     Supine to sit: Max assist;+2 for physical assistance     General bed mobility comments: PTA initiated movement of LEs and hip with total assistance, to elevate trunk into seated position she did reach for assistance and pull into trunk flexion.    Transfers Overall transfer level: Needs assistance Equipment used: Ambulation equipment used(sara stedy) Transfers: Sit to/from [facility] Sit to Stand: +2 physical assistance;Mod assist;Max assist Stand pivot transfers: Total assist;+2 physical assistance(with use of sara stedy for support.  )       General transfer comment: Pt required bed elevated and step by step instruction to use stedy to achieve standing.  Pt required assistance of bed pad and +2 assistance to achieve standing.  From elevated bed height she required mod +2 assistance from standard height of bed side commode she required max +2.    Ambulation/Gait Ambulation/Gait assistance: (NT unable at this time.  )           General Gait Details: unable   Stairs             Wheelchair Mobility    Modified [place] (Stroke Patients Only)       Balance Overall balance assessment: History of Falls;Needs assistance Sitting-balance support: Single extremity supported;Feet supported Sitting balance-Leahy Scale: Poor Sitting balance - Comments: Holding to brother's hand and therapists hand for support.       Standing balance-Leahy Scale: Poor Standing balance comment: standing with  external assistance +2 and B UE support.                            Cognition Arousal/Alertness: Awake/alert Behavior During Therapy: Agitated(brother present and able to redirect) Overall Cognitive Status: History of cognitive impairments - at baseline                                 General Comments: h/o dementia      Exercises       General Comments        Pertinent Vitals/Pain Pain Assessment: Faces Faces Pain Scale: Hurts even more Pain Location: R LE with any tactile input Pain Descriptors / Indicators: Operative site guarding;Discomfort;Grimacing Pain Intervention(s): Monitored during session;Repositioned    Home Living                      Prior Function            PT Goals (current goals can now be found in the care plan section) Acute Rehab PT Goals Patient Stated Goal: to go to the bathroom Potential to Achieve Goals: Fair Progress towards PT goals: Progressing toward goals    Frequency    Min 3X/week      PT Plan Discharge plan needs to be updated    Co-evaluation              AM-PAC PT "6 Clicks" Mobility   Outcome Measure  Help needed turning from your back to your side while in a flat bed without using bedrails?: Total Help needed moving from lying on your back to sitting on the side of a flat bed without using bedrails?: Total Help needed moving to and from a bed to a chair (including a wheelchair)?: Total Help needed standing up from a chair using your arms (e.g., wheelchair or bedside chair)?: Total Help needed to walk in hospital room?: Total Help needed climbing 3-5 steps with a railing? : Total 6 Click Score: 6    End of Session Equipment Utilized During Treatment: Gait belt Activity Tolerance: Patient limited by pain;Treatment limited secondary to agitation Patient left: in bed;with call bell/phone within reach;with bed alarm set Nurse Communication: Mobility status PT Visit Diagnosis: Other abnormalities of gait and mobility (R26.89);Muscle weakness (generalized) (M62.81);Pain Pain - Right/Left: Right Pain - part of body: Hip     Time: 4827-0786 PT Time Calculation (min) (ACUTE ONLY): 39 min  Charges:  $Therapeutic Activity: 38-52 mins                     Virginia [name], PTA Acute Rehabilitation Services Pager [phone] Office  [phone]     Virginia [name] Virginia [name] 04/27/2019, 11:02 AM

## 2019-04-27 NOTE — Care Management (Signed)
Spoke with Zack with [employer] , DME scheduled to be delivered today between 10 am and 2 pm.  Magdalen Spatz RN BSN [phone]

## 2019-04-27 NOTE — TOC Transition Note (Signed)
Transition of Care [facility]) - CM/SW Discharge Note   Patient Details  Name: Virginia [name] MRN: [id number] Date of Birth: [birth year]  [facility] of [facility]) CM/SW Contact:  Marilu Favre, RN Phone Number: 04/27/2019, 1:31 PM   Clinical Narrative:     Damaris Schooner to [name] , DME has arrived at home and [name] ready for her mother to come home. Nurse ready for PTAR to be called.   Called PTAR , PTAR on way now. Patient, [name], and nurse aware. [name] will call [name].   Final next level of care: [place] Barriers to Discharge: No Barriers Identified   Patient Goals and CMS Choice Patient states their goals for this hospitalization and ongoing recovery are:: for her to be safe and cared for CMS Medicare.gov Compare Post Acute Care list provided to:: Patient Represenative (must comment) Choice offered to / list presented to : Adult Children  Discharge Placement                       Discharge Plan and Services In-house Referral: Clinical Social Work Discharge Planning Services: CM Consult Post Acute Care Choice: Home Health          DME Arranged: 3-N-1, Hospital bed, Gel overlay, Lightweight manual wheelchair with seat cushion(Hoyer lift) DME Agency: [employer] Date DME Agency Contacted: 04/24/19 Time DME Agency Contacted: 1520 Representative spoke with at DME Agency: [place] Arranged: RN, PT, OT, Nurse's Aide [place] Agency: [place] Date [place]: 04/24/19 Time [place]: 1330 Representative spoke with at [place]: [place] ([place]) Interventions     Readmission Risk Interventions No flowsheet data found.

## 2019-04-27 NOTE — Plan of Care (Signed)
  Problem: Clinical Measurements: Goal: Postoperative complications will be avoided or minimized Outcome: Progressing   Problem: Self-Concept: Goal: Ability to maintain and perform role responsibilities to the fullest extent possible will improve Outcome: Progressing   Problem: Pain Management: Goal: Pain level will decrease Outcome: Progressing   

## 2019-04-27 NOTE — Progress Notes (Signed)
Occupational Therapy Treatment Patient Details Name: Virginia [name] MRN: [id number] DOB: October 12, 1936 Today's Date: 04/27/2019    History of present illness Pt is an 83 y.o. female admitted 04/20/19 after unwitnessed fall at home with RLE pain; found to have displaced R intertrochanteric fx. Now s/p R intertrochanteric IM nail 5/21. PMH includes dementia, CHF, CAD, COPD, CKD III, anxiety.   OT comments  Pt agitated this session with difficulty intitiating and completing tasks. Pt's brother present this session and pt in recliner from previous PT visit. Pt's brother reports that pt withh have hired assist at home for ADLs, transfers and for sup as coordinated per family and declines short term SNF for rehab. OT still recommending SNF at d/c , however pt's family has refused and pt will ned max HH services  Follow Up Recommendations  SNF;Supervision/Assistance - 24 hour(family refsuing SNF and wants pt home with [facility] and hired assist)    Equipment Recommendations  3 in 1 bedside commode;Hospital bed;Other (comment)    Recommendations for Other Services      Precautions / Restrictions Precautions Precautions: Fall Precaution Comments: risk for skin break down due to prolonged suping Restrictions Weight Bearing Restrictions: Yes RLE Weight Bearing: Weight bearing as tolerated       Mobility Bed Mobility Overal bed mobility: Needs Assistance Bed Mobility: Supine to Sit     Supine to sit: Max assist;+2 for physical assistance     General bed mobility comments: pt in recliner upon arrival  Transfers Overall transfer level: Needs assistance Equipment used: Ambulation equipment used(sara stedy) Transfers: Sit to/from [facility] Sit to Stand: +2 physical assistance;Mod assist;Max assist Stand pivot transfers: Total assist;+2 physical assistance(with use of sara stedy for support.  )       General transfer comment: pt declined     Balance Overall balance assessment:  History of Falls;Needs assistance Sitting-balance support: Single extremity supported;Feet supported Sitting balance-Leahy Scale: Fair Sitting balance - Comments: pt sittign up in recliner for simple ADL tasks     Standing balance-Leahy Scale: Poor Standing balance comment: standing with external assistance +2 and B UE support.                           ADL either performed or assessed with clinical judgement   ADL Overall ADL's : Needs assistance/impaired         Upper Body Bathing: Minimal assistance;With caregiver independent assisting       Upper Body Dressing : Minimal assistance;With caregiver independent assisting                     General ADL Comments: Pt's brother present this session and pt in recliner from previous PT visit. Pt's brother reposts that pt withh have hired assist at home for ADLs, transfers and for sup as coordinated per family and declines short term SNF for rehab     Vision Baseline Vision/History: Wears glasses Wears Glasses: Reading only Patient Visual Report: No change from baseline     Perception     Praxis      Cognition Arousal/Alertness: Awake/alert Behavior During Therapy: Agitated Overall Cognitive Status: History of cognitive impairments - at baseline                                 General Comments: h/o dementia        Exercises  Shoulder Instructions       General Comments      Pertinent Vitals/ Pain       Pain Assessment: Faces Faces Pain Scale: Hurts little more Pain Location: R LE with any tactile input Pain Descriptors / Indicators: Operative site guarding;Discomfort;Grimacing Pain Intervention(s): Limited activity within patient's tolerance;Monitored during session;Repositioned  Home Living                                          Prior Functioning/Environment              Frequency  Min 2X/week        Progress Toward Goals  OT  Goals(current goals can now be found in the care plan section)  Progress towards OT goals: OT to reassess next treatment  Acute Rehab OT Goals Patient Stated Goal: go home  Plan Discharge plan remains appropriate    Co-evaluation                 AM-PAC OT "6 Clicks" Daily Activity     Outcome Measure   Help from another person eating meals?: None Help from another person taking care of personal grooming?: A Little Help from another person toileting, which includes using toliet, bedpan, or urinal?: Total Help from another person bathing (including washing, rinsing, drying)?: A Lot Help from another person to put on and taking off regular upper body clothing?: A Little Help from another person to put on and taking off regular lower body clothing?: Total 6 Click Score: 14    End of Session    OT Visit Diagnosis: Unsteadiness on feet (R26.81);Muscle weakness (generalized) (M62.81);Other abnormalities of gait and mobility (R26.89);History of falling (Z91.81);Other symptoms and signs involving cognitive function;Pain Pain - Right/Left: Right Pain - part of body: Hip   Activity Tolerance     Patient Left in chair;with call bell/phone within reach;with bed alarm set;with family/visitor present   Nurse Communication          Time: 0938-1829 OT Time Calculation (min): 24 min  Charges: OT General Charges $OT Visit: 1 Visit OT Treatments $Self Care/Home Management : 8-22 mins $Therapeutic Activity: 8-22 mins     Britt Bottom 04/27/2019, 12:48 PM

## 2019-04-27 NOTE — Discharge Summary (Addendum)
PROGRESS NOTE    Virginia [name]  RUE:[id number] DOB: [birth year] DOA: 04/20/2019 PCP: Lauree Chandler, NP    Brief Narrative:  83 y.o. female with medical history significant of CHF, CAD, left carotid artery stenosis s/p enterectomy, COPD, CKD stage III dementia, and anxiety; who presents after having a unwitnessed fall at home.  Patient has a history of dementia and history is obtained from her brother who is present at bedside.  Apparently around 2:30 AM this morning she was found outside her door complaining of pain in her back and right hip.  Unclear if the patient had any loss of consciousness normally patient is cared for by her son and  brother at home.  She utilizes a walker intermittently to ambulate, but appears not to have been using it this morning.  It was abnormal for her to get up in the middle of the night as she had normally done this.  Patient was significantly anxious and agitated on arrival which is unusual for her and brother attributed this to pain.  04/27/19: Patient seen and examined at bedside.  Her brother was present in the room.  Patient has dementia and is unable to provide a reliable history.  Per her brother she started coughing around 2 AM.  Reviewed prior chest x-ray, vital signs and labs.  T-max 99.8 this morning suspect from atelectasis seen on last chest x-ray.  Noted no leukocytosis after reviewing her labs. Will treat for acute bronchitis empirically with azithromycin x5 days, Mucinex, and continue home nebs.  Assessment & Plan:   Principal Problem:   Closed intertrochanteric fracture of hip, right, initial encounter [facility]) Active Problems:   Essential hypertension   COPD GOLD IV   Dementia with behavioral disturbance (HCC)   Anxiety   Gastroesophageal reflux disease   Leukocytosis   Fall at home, initial encounter   Displaced intertrochanteric fracture of right femur ([place])  Fall at home with closed right intertrochanteric fracture of right hip, initial  encounter: Acute.  Patient had unwitnessed fall at home complaining of right hip pain.  X-rays revealed displaced intertrochanteric fracture on the right.  -Orthopedic Surgery following -Pt underwent surgery 5/21 -PT following. Home with home health anticipated. CM following. Discussed with family at bedside.   Acute bronchitis Cough reported overnight Suspect acute bronchitis, started on azithromycin x5 days for its anti-inflammatory effect and Mucinex Continue home nebs Follow-up with your PCP  Bibasilar atelectasis Noted on last chest x-ray done on 04/20/2019 Encouraged to use incentive spirometer Will need reinforcement due to dementia T-max 99.8 this morning, suspect from atelectasis O2 saturation 93-94% on room air  Resolved leukocytosis: Acute.  WBC mildly elevated at 10.7 on 04/20/2019.  Chest x-ray was otherwise clear and with suspect of a white blood cell count related with acute fracture.  Essential hypertension: Blood pressures consistently elevated. Added amlodipine 5 mg daily  Possible component of acute pain -Continue Bystolic and furosemide -Follow-up with your PCP  COPD, without acute exacerbation: Stable.  Chest x-ray revealing cardiomegaly without signs of edema or infiltrate. -Continue home regimen of Breo and Duonebs -Started on azithromycin for its anti-inflammatory properties -93 to 94% on room air  Resolved AKI.  Creatinine 1.10 with GFR 47 on admission.    Creatinine on 04/26/2019 was 0.76 with GFR greater than 60.  Continue to hold off nephrotoxins.  Dementia: Patient at baseline able to recognize family members. -Continued on Namenda and donepezil -Family remains at bedside per visitation policy  Anxiety: Patient was noted  to be very anxious and agitated on arrival.  Normally takes Zoloft for anxiety. -Continue Zoloft   GERD -Continue Pepcid and Protonix as tolerated -Stable currently  Acute on chronic diastolic CHF exacerbation -Euvolemic on  exam -Weaned to room air -Follow-up with cardiology -Continue cardiac medications   Code Status: Full Family Communication: Pt in room, brother who lives with her sitting at bedside  Consultants:   Orthopedic Surgery  Procedures:  Intramedullary fixation, Right femur - 5/21, Dr. Lyla Glassing  Antimicrobials: Anti-infectives (From admission, onward)   Start     Dose/Rate Route Frequency Ordered Stop   04/28/19 1000  azithromycin (ZITHROMAX) tablet 250 mg     250 mg Oral Daily 04/27/19 1010 05/02/19 0959   04/28/19 0000  azithromycin (ZITHROMAX) 250 MG tablet        04/27/19 1014     04/27/19 1100  azithromycin (ZITHROMAX) tablet 500 mg     500 mg Oral Daily 04/27/19 1010 04/27/19 1045   04/21/19 1600  ceFAZolin (ANCEF) IVPB 2g/100 mL premix     2 g 200 mL/hr over 30 Minutes Intravenous Every 6 hours 04/21/19 1414 04/21/19 2211   04/21/19 0600  ceFAZolin (ANCEF) IVPB 2g/100 mL premix     2 g 200 mL/hr over 30 Minutes Intravenous On call to O.R. 04/20/19 1827 04/21/19 0821      Subjective: Pleasantly confused this AM  Objective: Vitals:   04/26/19 2126 04/27/19 0610 04/27/19 0750 04/27/19 0915  BP: (!) 176/83 (!) 168/75  (!) 175/93  Pulse: 92 96  (!) 102  Resp:  18    Temp:  98.6 F (37 C)  99.8 F (37.7 C)  TempSrc:  Oral  Oral  SpO2:  93% 94% 93%  Weight:      Height:       No intake or output data in the 24 hours ending 04/27/19 1310 Filed Weights   04/20/19 0443 04/21/19 0854  Weight: 80.7 kg 80.7 kg    Examination: General exam: Alert but confused in a state of dementia.  Well-developed well-nourished.  In no acute distress. Respiratory system: No audible wheezing.  Faint rales at bases.  Poor inspiratory effort. Cardiovascular system: Regular rate and rhythm with no rubs or gallops.  No JVD or thyromegaly noted.   Gastrointestinal system: Nondistended non tender with normal bowel sounds x4 quadrants Extremities: Trace edema in lower extremities  bilaterally.  2 out of 4 pulses in all 4 extremities. Psychiatry: Mood is appropriate for condition and setting.  Data Reviewed: I have personally reviewed following labs and imaging studies  CBC: Recent Labs  Lab 04/21/19 0747 04/22/19 0112 04/25/19 1110  WBC 10.4 9.9 8.3  HGB 12.3 10.0* 9.2*  HCT 40.0 31.7* 28.9*  MCV 96.2 94.3 93.2  PLT 185 191 948   Basic Metabolic Panel: Recent Labs  Lab 04/21/19 0747 04/22/19 0112 04/25/19 1110 04/26/19 1045  NA 143 142 142 144  K 4.6 4.2 3.6 3.4*  CL 107 107 104 105  CO2 25 26 27 28   GLUCOSE 115* 127* 117* 114*  BUN 12 14 18 15   CREATININE 0.98 0.94 0.74 0.76  CALCIUM 9.0 8.4* 8.5* 8.8*   GFR: Estimated Creatinine Clearance: 54.2 mL/min (by C-G formula based on SCr of 0.76 mg/dL). Liver Function Tests: No results for input(s): AST, ALT, ALKPHOS, BILITOT, PROT, ALBUMIN in the last 168 hours. No results for input(s): LIPASE, AMYLASE in the last 168 hours. No results for input(s): AMMONIA in the last 168 hours. Coagulation Profile:  No results for input(s): INR, PROTIME in the last 168 hours. Cardiac Enzymes: No results for input(s): CKTOTAL, CKMB, CKMBINDEX, TROPONINI in the last 168 hours. BNP (last 3 results) Recent Labs    08/30/18 1423 09/27/18 1446  PROBNP 345 503   HbA1C: No results for input(s): HGBA1C in the last 72 hours. CBG: No results for input(s): GLUCAP in the last 168 hours. Lipid Profile: No results for input(s): CHOL, HDL, LDLCALC, TRIG, CHOLHDL, LDLDIRECT in the last 72 hours. Thyroid Function Tests: No results for input(s): TSH, T4TOTAL, FREET4, T3FREE, THYROIDAB in the last 72 hours. Anemia Panel: No results for input(s): VITAMINB12, FOLATE, FERRITIN, TIBC, IRON, RETICCTPCT in the last 72 hours. Sepsis Labs: No results for input(s): PROCALCITON, LATICACIDVEN in the last 168 hours.  Recent Results (from the past 240 hour(s))  SARS Coronavirus 2 (CEPHEID - Performed in [place] hospital lab),  Hosp Order     Status: None   Collection Time: 04/20/19  5:34 AM  Result Value Ref Range Status   SARS Coronavirus 2 NEGATIVE NEGATIVE Final    Comment: (NOTE) If result is NEGATIVE SARS-CoV-2 target nucleic acids are NOT DETECTED. The SARS-CoV-2 RNA is generally detectable in upper and lower  respiratory specimens during the acute phase of infection. The lowest  concentration of SARS-CoV-2 viral copies this assay can detect is 250  copies / mL. A negative result does not preclude SARS-CoV-2 infection  and should not be used as the sole basis for treatment or other  patient management decisions.  A negative result may occur with  improper specimen collection / handling, submission of specimen other  than nasopharyngeal swab, presence of viral mutation(s) within the  areas targeted by this assay, and inadequate number of viral copies  (<250 copies / mL). A negative result must be combined with clinical  observations, patient history, and epidemiological information. If result is POSITIVE SARS-CoV-2 target nucleic acids are DETECTED. The SARS-CoV-2 RNA is generally detectable in upper and lower  respiratory specimens dur ing the acute phase of infection.  Positive  results are indicative of active infection with SARS-CoV-2.  Clinical  correlation with patient history and other diagnostic information is  necessary to determine patient infection status.  Positive results do  not rule out bacterial infection or co-infection with other viruses. If result is PRESUMPTIVE POSTIVE SARS-CoV-2 nucleic acids MAY BE PRESENT.   A presumptive positive result was obtained on the submitted specimen  and confirmed on repeat testing.  While 2019 novel coronavirus  (SARS-CoV-2) nucleic acids may be present in the submitted sample  additional confirmatory testing may be necessary for epidemiological  and / or clinical management purposes  to differentiate between  SARS-CoV-2 and other Sarbecovirus  currently known to infect humans.  If clinically indicated additional testing with an alternate test  methodology [phone]) is advised. The SARS-CoV-2 RNA is generally  detectable in upper and lower respiratory sp ecimens during the acute  phase of infection. The expected result is Negative. Fact Sheet for Patients:  StrictlyIdeas.no Fact Sheet for Healthcare Providers: BankingDealers.co.za This test is not yet approved or cleared by the [place] FDA and has been authorized for detection and/or diagnosis of SARS-CoV-2 by FDA under an Emergency Use Authorization (EUA).  This EUA will remain in effect (meaning this test can be used) for the duration of the COVID-19 declaration under Section 564(b)(1) of the Act, 21 U.S.C. section 360bbb-3(b)(1), unless the authorization is terminated or revoked sooner. Performed at [facility], [address] [place], [place] [address]   Surgical pcr screen     Status: None   Collection Time: 04/20/19 10:12 PM  Result Value Ref Range Status   MRSA, PCR NEGATIVE NEGATIVE Final   Staphylococcus aureus NEGATIVE NEGATIVE Final    Comment: (NOTE) The Xpert SA Assay (FDA approved for NASAL specimens in patients 50 years of age and older), is one component of a comprehensive surveillance program. It is not intended to diagnose infection nor to guide or monitor treatment. Performed at [facility], [place] [address], [place], [place] [address]      Radiology Studies: No results found.  Scheduled Meds: . amLODipine  5 mg Oral Daily  . [START ON 04/28/2019] azithromycin  250 mg Oral Daily  . docusate sodium  100 mg Oral BID  . donepezil  10 mg Oral Daily  . enoxaparin (LOVENOX) injection  40 mg Subcutaneous Q24H  . famotidine  40 mg Oral BID  . fluticasone  2 spray Each Nare Daily  . fluticasone furoate-vilanterol  1 puff Inhalation Daily  . furosemide  20 mg Oral Daily  . guaiFENesin   1,200 mg Oral BID  . ipratropium  2 spray Each Nare BID  . lactulose  20 g Oral NOW  . memantine  10 mg Oral BID  . nebivolol  2.5 mg Oral Daily  . sertraline  50 mg Oral QHS   Continuous Infusions: . methocarbamol (ROBAXIN) IV       LOS: 7 days   Kayleen Memos, MD [facility] Pager On Amion  If 7PM-7AM, please contact night-coverage 04/27/2019, 1:10 PM

## 2019-04-28 ENCOUNTER — Telehealth: Payer: Self-pay | Admitting: *Deleted

## 2019-04-28 ENCOUNTER — Encounter: Payer: Self-pay | Admitting: Nurse Practitioner

## 2019-04-28 ENCOUNTER — Other Ambulatory Visit: Payer: Self-pay

## 2019-04-28 ENCOUNTER — Ambulatory Visit (INDEPENDENT_AMBULATORY_CARE_PROVIDER_SITE_OTHER): Payer: Medicare Other | Admitting: Nurse Practitioner

## 2019-04-28 ENCOUNTER — Other Ambulatory Visit: Payer: Medicare Other | Admitting: *Deleted

## 2019-04-28 ENCOUNTER — Other Ambulatory Visit: Payer: Self-pay | Admitting: *Deleted

## 2019-04-28 DIAGNOSIS — K648 Other hemorrhoids: Secondary | ICD-10-CM | POA: Diagnosis not present

## 2019-04-28 DIAGNOSIS — I959 Hypotension, unspecified: Secondary | ICD-10-CM | POA: Diagnosis not present

## 2019-04-28 DIAGNOSIS — Z515 Encounter for palliative care: Secondary | ICD-10-CM

## 2019-04-28 DIAGNOSIS — N183 Chronic kidney disease, stage 3 (moderate): Secondary | ICD-10-CM

## 2019-04-28 DIAGNOSIS — M549 Dorsalgia, unspecified: Secondary | ICD-10-CM | POA: Diagnosis not present

## 2019-04-28 DIAGNOSIS — I2510 Atherosclerotic heart disease of native coronary artery without angina pectoris: Secondary | ICD-10-CM | POA: Diagnosis not present

## 2019-04-28 DIAGNOSIS — S72141D Displaced intertrochanteric fracture of right femur, subsequent encounter for closed fracture with routine healing: Secondary | ICD-10-CM | POA: Diagnosis not present

## 2019-04-28 DIAGNOSIS — K219 Gastro-esophageal reflux disease without esophagitis: Secondary | ICD-10-CM | POA: Diagnosis not present

## 2019-04-28 DIAGNOSIS — I130 Hypertensive heart and chronic kidney disease with heart failure and stage 1 through stage 4 chronic kidney disease, or unspecified chronic kidney disease: Secondary | ICD-10-CM | POA: Diagnosis not present

## 2019-04-28 DIAGNOSIS — I10 Essential (primary) hypertension: Secondary | ICD-10-CM

## 2019-04-28 DIAGNOSIS — E871 Hypo-osmolality and hyponatremia: Secondary | ICD-10-CM | POA: Diagnosis not present

## 2019-04-28 DIAGNOSIS — F0391 Unspecified dementia with behavioral disturbance: Secondary | ICD-10-CM

## 2019-04-28 DIAGNOSIS — J209 Acute bronchitis, unspecified: Secondary | ICD-10-CM

## 2019-04-28 DIAGNOSIS — K5790 Diverticulosis of intestine, part unspecified, without perforation or abscess without bleeding: Secondary | ICD-10-CM | POA: Diagnosis not present

## 2019-04-28 DIAGNOSIS — M25861 Other specified joint disorders, right knee: Secondary | ICD-10-CM | POA: Diagnosis not present

## 2019-04-28 DIAGNOSIS — E785 Hyperlipidemia, unspecified: Secondary | ICD-10-CM | POA: Diagnosis not present

## 2019-04-28 DIAGNOSIS — R410 Disorientation, unspecified: Secondary | ICD-10-CM

## 2019-04-28 DIAGNOSIS — F17210 Nicotine dependence, cigarettes, uncomplicated: Secondary | ICD-10-CM | POA: Diagnosis not present

## 2019-04-28 DIAGNOSIS — M8580 Other specified disorders of bone density and structure, unspecified site: Secondary | ICD-10-CM | POA: Diagnosis not present

## 2019-04-28 DIAGNOSIS — Z9841 Cataract extraction status, right eye: Secondary | ICD-10-CM | POA: Diagnosis not present

## 2019-04-28 DIAGNOSIS — E559 Vitamin D deficiency, unspecified: Secondary | ICD-10-CM | POA: Diagnosis not present

## 2019-04-28 DIAGNOSIS — Z9049 Acquired absence of other specified parts of digestive tract: Secondary | ICD-10-CM | POA: Diagnosis not present

## 2019-04-28 DIAGNOSIS — J9811 Atelectasis: Secondary | ICD-10-CM | POA: Diagnosis not present

## 2019-04-28 DIAGNOSIS — M150 Primary generalized (osteo)arthritis: Secondary | ICD-10-CM | POA: Diagnosis not present

## 2019-04-28 DIAGNOSIS — Z1159 Encounter for screening for other viral diseases: Secondary | ICD-10-CM | POA: Diagnosis not present

## 2019-04-28 DIAGNOSIS — I5033 Acute on chronic diastolic (congestive) heart failure: Secondary | ICD-10-CM | POA: Diagnosis not present

## 2019-04-28 DIAGNOSIS — J440 Chronic obstructive pulmonary disease with acute lower respiratory infection: Secondary | ICD-10-CM

## 2019-04-28 DIAGNOSIS — F419 Anxiety disorder, unspecified: Secondary | ICD-10-CM | POA: Diagnosis not present

## 2019-04-28 MED ORDER — IPRATROPIUM-ALBUTEROL 0.5-2.5 (3) MG/3ML IN SOLN: RESPIRATORY_TRACT | 12 refills | Status: DC

## 2019-04-28 NOTE — Telephone Encounter (Signed)
Transition Care Management Follow-up Telephone Call  Date of discharge and from where: 04/27/2019 From [facility]  How have you been since you were released from the hospital? combative  Any questions or concerns? Yes  daughter stated that patient needs something to calm her, very combative  Items Reviewed:  Did the pt receive and understand the discharge instructions provided? Yes   Medications obtained and verified? Yes   Any new allergies since your discharge? No   Dietary orders reviewed? Yes  Do you have support at home? Yes  Daughter  Other (ie: DME, Home Health, etc) Home Health  Functional Questionnaire: (I = Independent and D = Dependent) ADL's: D  Bathing/Dressing- D   Meal Prep- D  Eating- I with assistance  Maintaining continence- D  Transferring/Ambulation- D  Managing Meds- D   Follow up appointments reviewed:    PCP Hospital f/u appt confirmed? Yes  Scheduled to see Janett Billow on 04/28/2019 .  [facility] f/u appt confirmed? No    Are transportation arrangements needed? No   If their condition worsens, is the pt aware to call  their PCP or go to the ED? Yes  Was the patient provided with contact information for the PCP's office or ED? Yes  Was the pt encouraged to call back with questions or concerns? Yes

## 2019-04-28 NOTE — Progress Notes (Signed)
This service is provided via telemedicine  No vital signs collected/recorded due to the encounter was a telemedicine visit.   Location of patient (ex: home, work):  Home  Patient consents to a telephone visit:  Yes  Location of the provider (ex: office, home):  [facility], Office   Name of any referring provider:  N/A  Names of all persons participating in the telemedicine service and their role in the encounter:  S.Chrae B/CMA, Sherrie Mustache, NP, son [name]) and Patient   Time spent on call: 10 min with medical assistant       Careteam: Patient Care Team: Lauree Chandler, NP as PCP - General (Nurse Practitioner) Fay Records, MD as PCP - Cardiology (Cardiology) Tanda Rockers, MD as Consulting Physician (Pulmonary Disease) Conan Bowens, RN as Registered Nurse (Hospice and Palliative Medicine) Hilarie Fredrickson, Lajuan Lines, MD as Consulting Physician (Gastroenterology)  Advanced Directive information Does Patient Have a Medical Advance Directive?: No, Would patient like information on creating a medical advance directive?: No - Patient declined  Allergies  Allergen Reactions   Lidocaine Anaphylaxis, Swelling, Rash and Other (See Comments)    Any of the " [facility] "   Other Other (See Comments)    All drugs that end in "cane'-  novacane etc. Daughter states patient almost died when she had it when she was born    Chief Complaint  Patient presents with   Transitions Of Care    Hospitalization follow-up 04/20/19-04/27/2019 for hip fracture. Patients family is concerned about behavioral changes. Patient is a High Fall Risk.    Medication Management    Person who administers medications was not available at the time of call, all medications unknow at this time.      HPI: Patient is a 83 y.o. female via televisit  medical history significant ofCHF, CAD,left carotid artery stenosiss/penterectomy,COPD, CKD stage III dementia, and anxiety; who presents  after having a unwitnessed fall at home. Patient has a history of dementia. she was found outside her door complaining of pain in her back and right hip. X-rays revealed displaced intertrochanteric fracture on the right and underwent surgery on 5/21 for repair. Continues to be in a lot of pain when moved. Has hydrocodone-apap 1-2 tablets every 6 hours, using 1 every 2 hours during her PT and getting her dressed.   More combative. Wont do what daughter is asking her to do.   Home health, PT, OT, RN and aid has been assigned. Using [facility].    Has palliative care consult.   Pt with hx of COPD- prior to leaving hospital had increase in coughing episodes. Her O2 was stable and suspected acute bronchitis she was started on azithromycin. There was no elevation in WBC noted on labs prior to leaving hospital but had low grade temp of 99.8 o2 has been as low as 60s per daughter since she has been home, currently 85-90%- her son has an O2 machine and put her on 2L which brought O2 up to 94% She has been started on azithromycin yesterday for 5 days. Also on mucinex twice daily  Not coughing at this time.  Continues on breo. Has not needed duoneb, not having shortness of breath.   Hypertension-118/80- before medication. Daughter only gave her 1/2 bystolic but not amlodipine. She felt like the amlodipine was a mistake. (blood pressure high in the hospital but low at home)   Review of Systems:  Review of Systems  Unable to perform ROS:  Dementia    Past Medical History:  Diagnosis Date   Anxiety 07/08/2017   Arthritis    Carotid artery disease ([place])    a. s/p L CEA. b. followed by [facility].   Carotid stenosis 10/19/2012   Chronic diastolic CHF (congestive heart failure) ([place])    CKD (chronic kidney disease), stage III (HCC)    Congestive heart disease ([place])    COPD (chronic obstructive pulmonary disease) ([place])    COPD GOLD IV 12/30/2014   PFTs 12/15/14 FEV1  0.57 (29%) ratio 54 p 17% resp to saba      Cough 01/31/2016   Dementia with behavioral disturbance ([place]) 07/08/2017   Diverticulosis    Dyspnea    Essential hypertension 12/02/2014   Fall at home Sept. 2013   Family history of adverse reaction to anesthesia    SON IS SLOW TO WAKE    Gastroesophageal reflux disease 07/08/2017   Hyperlipidemia    Hypertension    Hyponatremia 12/02/2014   Hypotension 02/01/2017   Incontinent of urine    Internal hemorrhoid    Intertrochanteric fracture ([place]) 04/20/2019   Memory disorder 10/24/2014   Nausea and vomiting 11/30/2014   Primary osteoarthritis involving multiple joints 07/08/2017   Vitamin D deficiency 02/15/2015   Past Surgical History:  Procedure Laterality Date   APPENDECTOMY     BREAST REDUCTION SURGERY     CAROTID ENDARTERECTOMY     left CEA   CATARACT EXTRACTION Bilateral    COLONOSCOPY  2015   EYE SURGERY     cataracts removed, bilaterally    INTRAMEDULLARY (IM) NAIL INTERTROCHANTERIC Right 04/21/2019   Procedure: INTRAMEDULLARY (IM) NAIL INTERTROCHANTRIC;  Surgeon: Rod Can, MD;  Location: [facility];  Service: Orthopedics;  Laterality: Right;   skin cancer removal     TONSILLECTOMY     Social History:   reports that she quit smoking about 3 weeks ago. Her smoking use included cigarettes. She has a 5.00 pack-year smoking history. She has never used smokeless tobacco. She reports that she does not drink alcohol or use drugs.  Family History  Problem Relation Age of Onset   Heart disease Father        Before age 73   Hypertension Father    Heart attack Father    Cancer Mother 37       Brain   Dementia Brother    Cancer Maternal Aunt 61       breast cancer   Diabetes Maternal Aunt    Heart failure Maternal Grandmother    Diabetes Maternal Grandmother    Stroke Neg Hx     Medications: Patient's Medications  New Prescriptions   No medications on file  Previous Medications   ALBUTEROL (PROVENTIL) (2.5 MG/3ML) 0.083% NEBULIZER  SOLUTION    Take 6 mLs (5 mg total) by nebulization every 4 (four) hours as needed for wheezing or shortness of breath.   AMLODIPINE (NORVASC) 5 MG TABLET    Take 1 tablet (5 mg total) by mouth daily.   ARTIFICIAL TEAR OP    Place 2 drops into both eyes daily.    AZITHROMYCIN (ZITHROMAX) 250 MG TABLET    Take 1 tablet 250 mg daily x4 days   CARBINOXAMINE MALEATE 4 MG TABS    Take 1 tablet (4 mg total) by mouth every 8 (eight) hours as needed.   CHOLECALCIFEROL (D3 SUPER STRENGTH) 2000 UNITS CAPS    Take 1 capsule (2,000 Units total) by mouth daily.   DICLOFENAC SODIUM (VOLTAREN) 1 %  GEL    APPLY TO AFFECTED AREA TWICE A DAY IF NEEDED FOR SHOULDER AND KNEE PAIN   DOCUSATE SODIUM (COLACE) 100 MG CAPSULE    Take 1 capsule (100 mg total) by mouth 2 (two) times daily.   DONEPEZIL (ARICEPT) 10 MG TABLET    TAKE 1 TABLET BY MOUTH EVERY DAY   ENOXAPARIN (LOVENOX) 40 MG/0.4ML INJECTION    Inject 0.4 mLs (40 mg total) into the skin daily.   FAMOTIDINE (PEPCID) 40 MG TABLET    Take 40 mg by mouth 2 (two) times daily.   FLUTICASONE (FLONASE) 50 MCG/ACT NASAL SPRAY    USE 2 SPRAYS IN EACH NOSTRIL DAILY   FLUTICASONE FUROATE-VILANTEROL (BREO ELLIPTA) 100-25 MCG/INH AEPB    Inhale 1 puff into the lungs daily.   FUROSEMIDE (LASIX) 20 MG TABLET    Take 1 tablet (20 mg total) by mouth daily.   GUAIFENESIN (MUCINEX) 600 MG 12 HR TABLET    Take 2 tablets (1,200 mg total) by mouth 2 (two) times daily for 4 days.   HYDROCODONE-ACETAMINOPHEN (NORCO/VICODIN) 5-325 MG TABLET    Take 1-2 tablets by mouth every 6 (six) hours as needed for moderate pain.   IPRATROPIUM (ATROVENT) 0.06 % NASAL SPRAY    USE 2 SPRAYS IN EACH NOSTRIL TWICE DAILY   IPRATROPIUM-ALBUTEROL (DUONEB) 0.5-2.5 (3) MG/3ML SOLN    inhale contents of 1 vial in nebulizer every 6 hours   MEMANTINE (NAMENDA) 10 MG TABLET    Take 1 tablet (10 mg total) by mouth 2 (two) times daily.   NEBIVOLOL (BYSTOLIC) 5 MG TABLET    TAKE 0.5 TABLET BY MOUTH EVERY DAY. HOLD A  DOSE IF SYSTOLIC BLOOD PRESSURE LESS THAN 105. IF SYSTOLIC BLOOD PRESSURE OVER 150, GIVE 1 WHOLE   ONDANSETRON (ZOFRAN-ODT) 4 MG DISINTEGRATING TABLET    DISSOLVE 1 TABLET ON THE TONGUE IN THE MORNING   PANTOPRAZOLE (PROTONIX) 40 MG TABLET    TAKE 1 TABLET BY MOUTH ONCE DAILY   SERTRALINE (ZOLOFT) 50 MG TABLET    1 TABLET DAILY  Modified Medications   No medications on file  Discontinued Medications   No medications on file     Physical Exam:  Unable due to televisit  Labs reviewed: Basic Metabolic Panel: Recent Labs    06/21/18 0000 08/30/18 1423  04/22/19 0112 04/25/19 1110 04/26/19 1045  NA 142 141   < > 142 142 144  K 4.1 4.8   < > 4.2 3.6 3.4*  CL 104 99   < > 107 104 105  CO2 24 27   < > 26 27 28   GLUCOSE 97 131*   < > 127* 117* 114*  BUN 17 20   < > 14 18 15   CREATININE 1.16* 1.39*   < > 0.94 0.74 0.76  CALCIUM 8.6* 8.7   < > 8.4* 8.5* 8.8*  TSH 0.603 0.422*  --   --   --   --    < > = values in this interval not displayed.   Liver Function Tests: Recent Labs    06/21/18 0000 08/30/18 1423  AST 20 19  ALT 15 13  ALKPHOS 56 67  BILITOT 1.2 0.8  PROT 5.5* 5.8*  ALBUMIN 3.3* 3.8   No results for input(s): LIPASE, AMYLASE in the last 8760 hours. No results for input(s): AMMONIA in the last 8760 hours. CBC: Recent Labs    04/20/19 0456 04/21/19 0747 04/22/19 0112 04/25/19 1110  WBC 10.7* 10.4 9.9 8.3  NEUTROABS 8.0*  --   --   --   HGB 13.6 12.3 10.0* 9.2*  HCT 43.2 40.0 31.7* 28.9*  MCV 94.3 96.2 94.3 93.2  PLT 234 185 191 230   Lipid Panel: Recent Labs    06/21/18 0000  CHOL 189  HDL 58  LDLCALC 103*  TRIG 140  CHOLHDL 3.3   TSH: Recent Labs    06/21/18 0000 08/30/18 1423  TSH 0.603 0.422*   A1C: Lab Results  Component Value Date   HGBA1C 5.2 12/10/2017     Assessment/Plan 1. Dementia with behavioral disturbance, unspecified dementia type ([place]) -progressive decline, continues on aricept and namenda at this time.   2. Acute  bronchitis with COPD [facility]) -daughter was frantic at beginning of call due to drop in O2, could not get her oxygen over 85%. Stated that they did NOT want to bring her back to the hosptial and would prefer to leave her at home and make her comfortable however after more discussion if needed they would want full measures to keep her alive and would want her to go to the hosptal for aggressive treatment. Son has O2 tank and hooked her up to 2L were sats improved to 94%. She has no shortness of breath, cough or congestion at this time. Concern for over sedation with hydrocodone-apap and this was discussed with family. They again did not want her to go to the hospital. Educated that O2 sats in the 80s were too low.  - to continue on azithromycin, mucinex twice daily and to use duonebs BID routinely for next 3 days then as needed. Deep breathing exercises encouraged.  - Ambulatory referral to [facility] made and spoke with home health RN who will plan to make a visit tomorrow to assess. May need O2 and will check O2 sats and see if she qualifies for home O2  3. Delirium Likely multifactoral as she has recently been hospitalized, she is post-op and with acute bronchitis. Having increase in behaviors at this time and discussed with family that pain and pain medication could be making this worse. Encouraged routine, lots of encouragement and verbal cues.  4. Closed displaced intertrochanteric fracture of right femur with routine healing, subsequent encounter -s/p repair now home with home health pt. Pt has been in a lot of pain with movement and family giving pain medication more often than prescribed which has left her lethargic. Stressed importance of not using more often as this could lead to respiratory depression and cause her to stop breathing. Family verbalized understanding. strongly encouraged minimize use of hydrocodone-apap and to take 1 tablet every 6 hours as needed. Can use tylenol 500 mg 1-2 tablets  every 6 hours as needed not to exceed 3000 mg from all sources in 24 hours- MAX of 6 tablets in 24 hours  5. Essential hypertension Blood pressure low based on home reading. To hold off on amlodipine at this time. Continue to monitor blood pressure and use bystolic as directed. Will have home health agency    Next appt: 05/30/2019 Carlos American. Marlis Oldaker, [facility] [phone]   Virtual Visit via Telephone Note  I connected with pt, daughter and son on 04/28/19 at  2:15 PM EDT by telephone and verified that I am speaking with the correct person using two identifiers.  Location: Patient: home Provider: office    I discussed the limitations, risks, security and privacy concerns of performing an evaluation and management service by telephone  and the availability of in person appointments. I also discussed with the patient that there may be a patient responsible charge related to this service. The patient expressed understanding and agreed to proceed.   I discussed the assessment and treatment plan with the patient. The patient was provided an opportunity to ask questions and all were answered. The patient agreed with the plan and demonstrated an understanding of the instructions.   The patient was advised to call back or seek an in-person evaluation if the symptoms worsen or if the condition fails to improve as anticipated.  I provided 35 minutes of non-face-to-face time during this encounter.  Carlos American. Harle Battiest Avs printed and mailed

## 2019-04-28 NOTE — Telephone Encounter (Signed)
Virginia [name] with [facility] called requesting verbal orders for Nursing and OT. Verbal orders given.

## 2019-04-28 NOTE — Telephone Encounter (Signed)
[facility] is calling stating that patient's family is calling them requesting refill. We have not filled this medication and the last time it was filled was 2018.   Pended Rx and sent to [facility] for approval.

## 2019-04-28 NOTE — Patient Instructions (Addendum)
strongly encourage to  minimize use of hydrocodone-apap and to take 1 tablet every 6 hours as needed for severe pain.  To supplement with tylenol 500 mg 1-2 tablets every 6 hours as needed not to exceed 3000 mg from all sources in 24 hours   To encourage deep breathing once an hour while awake  Encourage proper hydration  Home health nursing has been ordered

## 2019-04-29 ENCOUNTER — Telehealth: Payer: Self-pay | Admitting: *Deleted

## 2019-04-29 DIAGNOSIS — J449 Chronic obstructive pulmonary disease, unspecified: Secondary | ICD-10-CM

## 2019-04-29 NOTE — Telephone Encounter (Signed)
Can we call [facility] and see what they need from [place]. Hopefully they will be willing to take documentation from the home health agency.

## 2019-04-29 NOTE — Telephone Encounter (Signed)
Order has been signed and printed

## 2019-04-29 NOTE — Telephone Encounter (Signed)
Called and spoke with [facility] with [facility]. Patient is 86% at room air. Patient is bed bound. When patient sits up in bed the Pulse O2 drops to 85%.   Spoke with Estill Bamberg at [facility] and she stated to place the order and to call her back and she will take care of the Oxygen.   Order placed.

## 2019-04-29 NOTE — Telephone Encounter (Signed)
[name], daughter called and stated that they need an order faxed to [facility] for Oxygen Machine with tubing. Stated due to low Pulse Ox.  Attached order for approval.   Azithromycin She has only received one a day. She thought she received 2 in the hospital but did not. So she never got 2 the first day and has only gotten 1 yesterday and 1 today.   Please Advise.

## 2019-04-29 NOTE — Telephone Encounter (Signed)
Chelsea with [facility] called and stated that patient was evaluated yesterday and her Room O2 Sat was 86% with exertion.  Daughter is requesting them to get her Oxygen but they told her that it had to come from the PCP. Daughter is wanting it to come from [facility]. [phone] Fax: [phone]  Please Advise.

## 2019-04-29 NOTE — Telephone Encounter (Signed)
Patient daughter notified and agreed. Stated that if we order Oxygen to send an order to [facility].  Stated that she will have [place] to call our office.

## 2019-04-29 NOTE — Telephone Encounter (Signed)
Spoke with Estill Bamberg at [facility] [phone] and she stated that she has received the signed order and will get the Oxygen ordered for the patient.   [name], daughter notified and agreed.

## 2019-04-29 NOTE — Telephone Encounter (Signed)
Rx for azithromycin was sent to [facility], [place] (Ph: [phone]) Based on hospital discharge summary, needs to complete course of antibiotic  Home health nursing plans to do a home visit to evaluate for oxygen. We must provide proper documentation to get a prescription for this so oxygen company can provide for her.

## 2019-05-02 ENCOUNTER — Telehealth: Payer: Self-pay | Admitting: *Deleted

## 2019-05-02 ENCOUNTER — Ambulatory Visit (INDEPENDENT_AMBULATORY_CARE_PROVIDER_SITE_OTHER): Payer: Medicare Other | Admitting: Nurse Practitioner

## 2019-05-02 ENCOUNTER — Other Ambulatory Visit: Payer: Self-pay

## 2019-05-02 ENCOUNTER — Encounter: Payer: Self-pay | Admitting: Nurse Practitioner

## 2019-05-02 ENCOUNTER — Other Ambulatory Visit: Payer: Medicare Other | Admitting: *Deleted

## 2019-05-02 DIAGNOSIS — F0391 Unspecified dementia with behavioral disturbance: Secondary | ICD-10-CM | POA: Diagnosis not present

## 2019-05-02 DIAGNOSIS — K5903 Drug induced constipation: Secondary | ICD-10-CM

## 2019-05-02 DIAGNOSIS — J440 Chronic obstructive pulmonary disease with acute lower respiratory infection: Secondary | ICD-10-CM

## 2019-05-02 DIAGNOSIS — Z515 Encounter for palliative care: Secondary | ICD-10-CM

## 2019-05-02 DIAGNOSIS — F419 Anxiety disorder, unspecified: Secondary | ICD-10-CM | POA: Diagnosis not present

## 2019-05-02 DIAGNOSIS — S72141D Displaced intertrochanteric fracture of right femur, subsequent encounter for closed fracture with routine healing: Secondary | ICD-10-CM | POA: Diagnosis not present

## 2019-05-02 DIAGNOSIS — J209 Acute bronchitis, unspecified: Secondary | ICD-10-CM

## 2019-05-02 MED ORDER — DOCUSATE SODIUM 100 MG PO CAPS: 100.0000 mg | ORAL_CAPSULE | Freq: Two times a day (BID) | ORAL | 5 refills | Status: DC

## 2019-05-02 MED ORDER — SERTRALINE HCL 50 MG PO TABS: 75.0000 mg | ORAL_TABLET | Freq: Every day | ORAL | 5 refills | Status: DC

## 2019-05-02 MED ORDER — ALPRAZOLAM 0.25 MG PO TABS: ORAL_TABLET | ORAL | 0 refills | Status: DC

## 2019-05-02 NOTE — Progress Notes (Signed)
COMMUNITY PALLIATIVE CARE RN NOTE  PATIENT NAME: Virginia [name] DOB: [birth year] MRN: [id number]  PRIMARY CARE PROVIDER: Lauree Chandler, NP  RESPONSIBLE PARTY:  Acct ID - Guarantor Home Phone Work Phone Relationship Acct Type  [id number] MIDA, CORY* [phone]  Self P/F     [address] [place], [place] [address]   Covid-19 Pre-screening negative  PLAN OF CARE and INTERVENTION:  1. ADVANCE CARE PLANNING/GOALS OF CARE: Goal is for patient to remain at home with hired caregivers. 2. PATIENT/CAREGIVER EDUCATION: Pain Management, Symptom Management 3. DISEASE STATUS: Daughter called this am requesting RN visit. Patient just recently returned home from the hospital yesterday s/p R hip surgery after an unwitnessed fall. Fall resulted in a closed intertrochanteric fracture of her R hip. She was hospitalized from 5/20-5/27. Daughter states that while patient was lying in bed today, her Oxygen level dropped to 68% on room air. She states patient appeared lethargic which prompted her to check her O2 sat. Upon arrival, patient sitting up in recliner awake. She is confused and unable to answer most questions appropriately. She begins to fall asleep on and off throughout visit. Patient's son had a spare Oxygen tank and concentrator in the home, so family placed this on patient and had her connected to an Oxygen tank. Her O2 sat was 93% on 1.5L. Patient was discharged from the hospital with home health RN/PT/OT through [facility]. Daughter also contacted RN with [name] to visit today who did arrive at 5pm. Oxygen assessment performed by home health RN and patient's Oxygen level dropped to 84% on room air. She will order patient's Oxygen in the am. Daughter reports patient has been combative today during personal care. She has also been refusing care. Physical Therapist visited with patient earlier today for an assessment. PT will be visiting with patient 2x/week. Patient is requiring assistance with all ADLs  and is non-weight bearing. She is transferred via [place]. She has a hospital bed. Daughter has hired caregivers during the day from 7a-7p and also during the night for a few days/week. Daughter does not have a caregiver for tonight and requested that patient be placed back in bed. Assisted with [facility] lift transfer and providing personal care. Daughter spoke with PCP this am who she says advised daughter to decrease the amount of Vicodin being given and alternate with Tylenol, not to exceed 3000mg /24 hours due to patient lethargy. Dressings to R hip and outer thigh dry and intact. Patient does scream out in pain during transfers and turning/repositioning. Witnessed patient grab and scratch hired caregiver during visit today. Vicodin dose given after transfer with positive results. Will continue to monitor.     HISTORY OF PRESENT ILLNESS: This is a 83 yo female who resides at home. Patient's brother, [name] and son, [name] also live with patient. Patient's daughter [name] is also present to help with patient care. Palliative Care Team continues to follow patient. Will continue to visit monthly and PRN.     CODE STATUS: Full Code ADVANCED DIRECTIVES: y MOST FORM: no PPS: 30%   PHYSICAL EXAM:   VITALS: Today's Vitals   04/28/19 1649  BP: 118/80  Pulse: 75  Resp: 16  Temp: 98.9 F (37.2 C)  TempSrc: Temporal  SpO2: 93%  PainSc: 6   PainLoc: Hip    LUNGS: clear to auscultation  CARDIAC: Cor RRR EXTREMITIES: No edema (Compression hose on) SKIN: Bruising noted to R upper arm, L hand, R hip and R leg; Dressings to R hip and R  outer thigh dry and intact  NEURO: Alert, confused, transferred via [facility] lift   (Duration of visit and documentation 140 minutes)    [name], RN BSN

## 2019-05-02 NOTE — Telephone Encounter (Signed)
Received fax from [employer] for Prior authorization for [employer]. Filled out form and placed in [place] folder to review and sign. To be faxed back to [facility] Fax: [phone]

## 2019-05-02 NOTE — Telephone Encounter (Signed)
Patient daughter requested appointment. Scheduled TeleVisit this afternoon with Janett Billow.

## 2019-05-02 NOTE — Telephone Encounter (Signed)
[name], Daughter called and stated that patient is taking Vicodin and Tylenol-1/2 tablet every 4 hours. Patient is needing something for anxiety also. Stated that she is screaming and has anxiety when they move her. Please Advise.

## 2019-05-02 NOTE — Progress Notes (Signed)
This service is provided via telemedicine  No vital signs collected/recorded due to the encounter was a telemedicine visit.   Location of patient (ex: home, work):  Home  Patient consents to a telephone visit:  Yes  Location of the provider (ex: office, home):  [facility], Office   Name of any referring provider:  N/A  Names of all persons participating in the telemedicine service and their role in the encounter: S.Chrae B/CMA, Sherrie Mustache, NP, [name] (daughter), Otila Kluver (caregiver), and Patient   Time spent on call:  19 min with medical assistant       Careteam: Patient Care Team: Lauree Chandler, NP as PCP - General (Nurse Practitioner) Fay Records, MD as PCP - Cardiology (Cardiology) Tanda Rockers, MD as Consulting Physician (Pulmonary Disease) Conan Bowens, RN as Registered Nurse (Hospice and Palliative Medicine) Hilarie Fredrickson Lajuan Lines, MD as Consulting Physician (Gastroenterology)  Advanced Directive information    Allergies  Allergen Reactions  . Lidocaine Anaphylaxis, Swelling, Rash and Other (See Comments)    Any of the " [facility] "  . Other Other (See Comments)    All drugs that end in "cane'-  novacane etc. Daughter states patient almost died when she had it when she was born    Chief Complaint  Patient presents with  . Acute Visit    Patient's family c/o behavior changes. Patient is combative, increased anxiety and fear.   . Medication Management    Discuss need for Mucinex  . Medication Management    1/2 Hydrocodone, 1/2 of tylenol being given every 4 hours as needed. Different from medication list   . Medication Refill    Refill Colace   . Orders    Order request for electric bed and electric hoyer lift. Send orders to [facility]      HPI: Patient is a 83 y.o. female with hx of medical history significant ofCHF, CAD,left carotid artery stenosiss/penterectomy,COPD, CKD stage III dementia, and anxiety Pt family concerned  over increase in behavioral changes. Reports she is combative. They are unable to change her because she is yelling and swinging and resistant to care. Worried they are going to hurt herself.  Reports she does take medication for brother and son. Resistant when daughter is around but then will be accepting. Does not complain of pain except for her back. Giving her 1/2 tablet hydrocodone/apap and 1/2 tablet tylenol every 4 hours which has been working. No problems while she is sleeping. Sleeps at night.  PT evaluated her and stressed it is a 2 person job for care. She recently fired the previous caregiver, was not changing her often enough and her skin was looking bad. Much better now that they have started changing her more often but she does not want them to change her. Caregiver there helping.  VS stable per caregiver. 128/71, HR 102  Will not allow for pulse Ox on her hand at times. 129/79, HR 79, 95% 127/72, 98%  A lot of anxiety with care- taking zoloft 50 mg by mouth daily. Daughter reports she has been stressed and very anxious for a long time. Anxiety started well before dementia and dementia has worsens anxiety. She is not combative all the time once they get her moved she is fine. She is very fearful and not calm.    Review of Systems:  Review of Systems  Unable to perform ROS: Dementia    Past Medical History:  Diagnosis Date  .  Anxiety 07/08/2017  . Arthritis   . Carotid artery disease ([place])    a. s/p L CEA. b. followed by [facility].  . Carotid stenosis 10/19/2012  . Chronic diastolic CHF (congestive heart failure) ([place])   . CKD (chronic kidney disease), stage III ([place])   . Congestive heart disease ([place])   . COPD (chronic obstructive pulmonary disease) ([place])   . COPD GOLD IV 12/30/2014   PFTs 12/15/14 FEV1  0.57 (29%) ratio 54 p 17% resp to saba    . Cough 01/31/2016  . Dementia with behavioral disturbance ([place]) 07/08/2017  . Diverticulosis   . Dyspnea   . Essential hypertension  12/02/2014  . Fall at home Sept. 2013  . Family history of adverse reaction to anesthesia    SON IS SLOW TO WAKE   . Gastroesophageal reflux disease 07/08/2017  . Hyperlipidemia   . Hypertension   . Hyponatremia 12/02/2014  . Hypotension 02/01/2017  . Incontinent of urine   . Internal hemorrhoid   . Intertrochanteric fracture ([place]) 04/20/2019  . Memory disorder 10/24/2014  . Nausea and vomiting 11/30/2014  . Primary osteoarthritis involving multiple joints 07/08/2017  . Vitamin D deficiency 02/15/2015   Past Surgical History:  Procedure Laterality Date  . APPENDECTOMY    . BREAST REDUCTION SURGERY    . CAROTID ENDARTERECTOMY     left CEA  . CATARACT EXTRACTION Bilateral   . COLONOSCOPY  2015  . EYE SURGERY     cataracts removed, bilaterally   . INTRAMEDULLARY (IM) NAIL INTERTROCHANTERIC Right 04/21/2019   Procedure: INTRAMEDULLARY (IM) NAIL INTERTROCHANTRIC;  Surgeon: Rod Can, MD;  Location: [facility];  Service: Orthopedics;  Laterality: Right;  . skin cancer removal    . TONSILLECTOMY     Social History:   reports that she quit smoking about 4 weeks ago. Her smoking use included cigarettes. She has a 5.00 pack-year smoking history. She has never used smokeless tobacco. She reports that she does not drink alcohol or use drugs.  Family History  Problem Relation Age of Onset  . Heart disease Father        Before age 84  . Hypertension Father   . Heart attack Father   . Cancer Mother 6       Brain  . Dementia Brother   . Cancer Maternal Aunt 61       breast cancer  . Diabetes Maternal Aunt   . Heart failure Maternal Grandmother   . Diabetes Maternal Grandmother   . Stroke Neg Hx     Medications: Patient's Medications  New Prescriptions   No medications on file  Previous Medications   ALBUTEROL (PROVENTIL) (2.5 MG/3ML) 0.083% NEBULIZER SOLUTION    Take 6 mLs (5 mg total) by nebulization every 4 (four) hours as needed for wheezing or shortness of breath.   AMLODIPINE  (NORVASC) 5 MG TABLET    Take 1 tablet (5 mg total) by mouth daily.   ARTIFICIAL TEAR OP    Place 2 drops into both eyes daily.    AZITHROMYCIN (ZITHROMAX) 250 MG TABLET    Take 1 tablet 250 mg daily x4 days   CARBINOXAMINE MALEATE 4 MG TABS    Take 1 tablet (4 mg total) by mouth every 8 (eight) hours as needed.   CHOLECALCIFEROL (D3 SUPER STRENGTH) 2000 UNITS CAPS    Take 1 capsule (2,000 Units total) by mouth daily.   DICLOFENAC SODIUM (VOLTAREN) 1 % GEL    APPLY TO AFFECTED AREA TWICE A DAY  IF NEEDED FOR SHOULDER AND KNEE PAIN   DOCUSATE SODIUM (COLACE) 100 MG CAPSULE    Take 1 capsule (100 mg total) by mouth 2 (two) times daily.   DONEPEZIL (ARICEPT) 10 MG TABLET    TAKE 1 TABLET BY MOUTH EVERY DAY   ENOXAPARIN (LOVENOX) 40 MG/0.4ML INJECTION    Inject 0.4 mLs (40 mg total) into the skin daily.   FLUTICASONE (FLONASE) 50 MCG/ACT NASAL SPRAY    USE 2 SPRAYS IN EACH NOSTRIL DAILY   FLUTICASONE FUROATE-VILANTEROL (BREO ELLIPTA) 100-25 MCG/INH AEPB    Inhale 1 puff into the lungs daily.   FUROSEMIDE (LASIX) 20 MG TABLET    Take 1 tablet (20 mg total) by mouth daily.   GUAIFENESIN (MUCINEX) 600 MG 12 HR TABLET    Take by mouth 2 (two) times daily.   HYDROCODONE-ACETAMINOPHEN (NORCO/VICODIN) 5-325 MG TABLET    Take 1-2 tablets by mouth every 6 (six) hours as needed for moderate pain.   IPRATROPIUM (ATROVENT) 0.06 % NASAL SPRAY    USE 2 SPRAYS IN EACH NOSTRIL TWICE DAILY   IPRATROPIUM-ALBUTEROL (DUONEB) 0.5-2.5 (3) MG/3ML SOLN    inhale contents of 1 vial in nebulizer every 6 hours   MEMANTINE (NAMENDA) 10 MG TABLET    Take 1 tablet (10 mg total) by mouth 2 (two) times daily.   MENTHOL, TOPICAL ANALGESIC, (BIOFREEZE) 4 % GEL    Apply topically as needed.   NEBIVOLOL (BYSTOLIC) 5 MG TABLET    TAKE 0.5 TABLET BY MOUTH EVERY DAY. HOLD A DOSE IF SYSTOLIC BLOOD PRESSURE LESS THAN 105. IF SYSTOLIC BLOOD PRESSURE OVER 150, GIVE 1 WHOLE   ONDANSETRON (ZOFRAN-ODT) 4 MG DISINTEGRATING TABLET    DISSOLVE 1  TABLET ON THE TONGUE IN THE MORNING   PANTOPRAZOLE (PROTONIX) 40 MG TABLET    Take 40 mg by mouth 2 (two) times daily.   SERTRALINE (ZOLOFT) 50 MG TABLET    1 TABLET DAILY  Modified Medications   No medications on file  Discontinued Medications   FAMOTIDINE (PEPCID) 40 MG TABLET    Take 40 mg by mouth 2 (two) times daily.   PANTOPRAZOLE (PROTONIX) 40 MG TABLET    TAKE 1 TABLET BY MOUTH ONCE DAILY    Physical Exam:  There were no vitals filed for this visit. There is no height or weight on file to calculate BMI. Wt Readings from Last 3 Encounters:  04/21/19 178 lb (80.7 kg)  12/14/18 190 lb (86.2 kg)  12/07/18 187 lb 1.9 oz (84.9 kg)    Unable to complete PE due to telephone visit.   Labs reviewed: Basic Metabolic Panel: Recent Labs    06/21/18 0000 08/30/18 1423  04/22/19 0112 04/25/19 1110 04/26/19 1045  NA 142 141   < > 142 142 144  K 4.1 4.8   < > 4.2 3.6 3.4*  CL 104 99   < > 107 104 105  CO2 24 27   < > 26 27 28   GLUCOSE 97 131*   < > 127* 117* 114*  BUN 17 20   < > 14 18 15   CREATININE 1.16* 1.39*   < > 0.94 0.74 0.76  CALCIUM 8.6* 8.7   < > 8.4* 8.5* 8.8*  TSH 0.603 0.422*  --   --   --   --    < > = values in this interval not displayed.   Liver Function Tests: Recent Labs    06/21/18 0000 08/30/18 1423  AST 20 19  ALT 15 13  ALKPHOS 56 67  BILITOT 1.2 0.8  PROT 5.5* 5.8*  ALBUMIN 3.3* 3.8   No results for input(s): LIPASE, AMYLASE in the last 8760 hours. No results for input(s): AMMONIA in the last 8760 hours. CBC: Recent Labs    04/20/19 0456 04/21/19 0747 04/22/19 0112 04/25/19 1110  WBC 10.7* 10.4 9.9 8.3  NEUTROABS 8.0*  --   --   --   HGB 13.6 12.3 10.0* 9.2*  HCT 43.2 40.0 31.7* 28.9*  MCV 94.3 96.2 94.3 93.2  PLT 234 185 191 230   Lipid Panel: Recent Labs    06/21/18 0000  CHOL 189  HDL 58  LDLCALC 103*  TRIG 140  CHOLHDL 3.3   TSH: Recent Labs    06/21/18 0000 08/30/18 1423  TSH 0.603 0.422*   A1C: Lab Results   Component Value Date   HGBA1C 5.2 12/10/2017     Assessment/Plan 1. Anxiety Worsening anxiety and panic with care. Will increase zoloft at this time to 75 mg by mouth daily. Will start low dose alprazolam, to start with half tablet. To use caution and monitor for increase sedation, worsening confusion or agitation.   - sertraline (ZOLOFT) 50 MG tablet; Take 1.5 tablets (75 mg total) by mouth daily.  Dispense: 45 tablet; Refill: 5 - ALPRAZolam (XANAX) 0.25 MG tablet; 0.5-1 tablet every 6 hours as needed severe anxiety, not to exceed 2 tablet in 24 hours  Dispense: 60 tablet; Refill: 0  2. Dementia with behavioral disturbance, unspecified dementia type ([place]) -progressively worsening dementia. Now with worsening anxiety due to recent fracture and pain  3. Acute bronchitis with COPD ([place]) Overall bronchitis is improving, needing less O2 at this time. No increase in cough, congestion or shortness of breath.   4. Closed displaced intertrochanteric fracture of right femur with routine healing, subsequent encounter Has physical therapy coming out to the house. Continues with hydrocodone- apap and tylenol 1/2 tablet while awake which has controlled pain. Has home health nurse coming out to monitor wound and change dressing. Notes oozing on bandage and home health coming out to assess.    5. Drug-induced constipation - docusate sodium (COLACE) 100 MG capsule; Take 1 capsule (100 mg total) by mouth 2 (two) times daily.  Dispense: 60 capsule; Refill: 5  Next appt: 05/30/2019, sooner if needed Janett Billow K. Caleel Kiner, [facility] [phone]    Virtual Visit via Telephone Note  I connected with pt, caregiver and daughter on 05/02/19 at  1:30 PM EDT by telephone and verified that I am speaking with the correct person using two identifiers.  Location: Patient: home Provider: office   I discussed the limitations, risks, security and privacy concerns of performing an  evaluation and management service by telephone and the availability of in person appointments. I also discussed with the patient that there may be a patient responsible charge related to this service. The patient expressed understanding and agreed to proceed.   I discussed the assessment and treatment plan with the patient. The patient was provided an opportunity to ask questions and all were answered. The patient agreed with the plan and demonstrated an understanding of the instructions.   The patient was advised to call back or seek an in-person evaluation if the symptoms worsen or if the condition fails to improve as anticipated.  I provided 25 minutes of non-face-to-face time during this encounter.  Carlos American. Harle Battiest Avs printed and mailed

## 2019-05-02 NOTE — Telephone Encounter (Signed)
She is taking her Vicodin too often it needs to be every 6 hours and not to exceed 3000 mg of tylenol/acetaminophen.  from all sources in 24 hours. Did they contact palliative care? Would recommend a visit from them also is she participating in physical therapy?

## 2019-05-03 DIAGNOSIS — M150 Primary generalized (osteo)arthritis: Secondary | ICD-10-CM | POA: Diagnosis not present

## 2019-05-03 DIAGNOSIS — J440 Chronic obstructive pulmonary disease with acute lower respiratory infection: Secondary | ICD-10-CM | POA: Diagnosis not present

## 2019-05-03 DIAGNOSIS — S72141D Displaced intertrochanteric fracture of right femur, subsequent encounter for closed fracture with routine healing: Secondary | ICD-10-CM | POA: Diagnosis not present

## 2019-05-03 DIAGNOSIS — I5033 Acute on chronic diastolic (congestive) heart failure: Secondary | ICD-10-CM | POA: Diagnosis not present

## 2019-05-03 DIAGNOSIS — I130 Hypertensive heart and chronic kidney disease with heart failure and stage 1 through stage 4 chronic kidney disease, or unspecified chronic kidney disease: Secondary | ICD-10-CM | POA: Diagnosis not present

## 2019-05-03 DIAGNOSIS — J209 Acute bronchitis, unspecified: Secondary | ICD-10-CM | POA: Diagnosis not present

## 2019-05-03 NOTE — Progress Notes (Signed)
COMMUNITY PALLIATIVE CARE RN NOTE  PATIENT NAME: Virginia [name] DOB: [birth year] MRN: [id number]  PRIMARY CARE PROVIDER: Lauree Chandler, NP  RESPONSIBLE PARTY:  Acct ID - Guarantor Home Phone Work Phone Relationship Acct Type  [id number] [name], [name]* [phone]  Self P/F     [address] [place],  [place] [address]   Covid-19 Pre-screening is negative  PLAN OF CARE and INTERVENTION:  1. ADVANCE CARE PLANNING/GOALS OF CARE: Goal is for patient in her home with family and hired caregivers.  2. PATIENT/CAREGIVER EDUCATION: Reinforced Safe Transfers and Pain Management 3. DISEASE STATUS: Received a call from patient's daughter, Virginia [name] this am to inform me that she is noticing increased blood underneath patient's surgical bandage and requested a RN visit to assess. Upon arrival, patient sitting up in wheelchair in her bedroom. Assisted hired caregiver with Harrel Lemon lift and placing patient back in her bed. Patient continues to report pain mainly with transfers, turning and repositioning. She continues on Vicodin and Tylenol, which was administered after patient transfer d/t patient c/o pain. Blood stain noted underneath surgical dressing covering about 3/4 of bandage. Dressing intact and no blood noted to be draining outside of bandage. Advised daughter to leave current dressing in place, and if blood is noted to be coming outside of bandage, to reinforce with another dressing as dressing is not supposed to be removed until after 7 days, which will be in 2 days. Home health RN to visit in 2 days and will assess incision at that time. Daughter also concerned with patient having a low BP this evening. Earlier today, her BP was 128/81. Bystolic was given per orders this am. This evening, patient's BP ranging from 80s-90s/50s-60s. Recommended that daughter try and push fluids. Patient is alert and responsive, but confused. If low BPs continue, will speak with PCP regarding adjusting BP medication. (BP was  not low when daughter spoke with PCP this afternoon). Earlier today, daughter advised patient was very hostile, combative and agitated and caregiver was unable to provide any personal care. Daughter had a virtual visit with PCP this afternoon and Alprazolam was prescribed. However, she received a call from patient's Pharmacy stating that Medicare is not approving this medication, but is supposed to call her back with other payment options. Physical Therapist scheduled to visit patient tomorrow and will be visiting 2x/week. Will continue to monitor.  HISTORY OF PRESENT ILLNESS:  This is a 83 yo female who resides in her home. Other family members live in the home and daughter present to help with care. Palliative Care Team continues to follow patient. Will continue to check in with patient monthly and PRN.  CODE STATUS: Full Code ADVANCED DIRECTIVES: Y MOST FORM: no PPS: 30%   PHYSICAL EXAM:   VITALS: Today's Vitals   05/02/19 1616  BP: 90/62  Pulse: 72  Resp: 18  SpO2: 96%  PainSc: 4   PainLoc: Hip    LUNGS: clear to auscultation  CARDIAC: Cor RRR EXTREMITIES: Trace edema in R lower extremity (patient would not allow compression stockings to be applied) SKIN: Bruising noted to L hand, R upper arm, R hip and R upper leg  NEURO: Alert to person, confused, alternating moods from pleasant to combative/agitated, transferred via [place]   (Duration of visit and documentation 105 minutes)   [name], RN BSN

## 2019-05-04 ENCOUNTER — Ambulatory Visit (INDEPENDENT_AMBULATORY_CARE_PROVIDER_SITE_OTHER): Payer: Medicare Other | Admitting: Nurse Practitioner

## 2019-05-04 ENCOUNTER — Telehealth: Payer: Self-pay

## 2019-05-04 ENCOUNTER — Encounter: Payer: Self-pay | Admitting: Nurse Practitioner

## 2019-05-04 ENCOUNTER — Other Ambulatory Visit: Payer: Self-pay

## 2019-05-04 DIAGNOSIS — J440 Chronic obstructive pulmonary disease with acute lower respiratory infection: Secondary | ICD-10-CM | POA: Diagnosis not present

## 2019-05-04 DIAGNOSIS — J209 Acute bronchitis, unspecified: Secondary | ICD-10-CM | POA: Diagnosis not present

## 2019-05-04 DIAGNOSIS — S72141D Displaced intertrochanteric fracture of right femur, subsequent encounter for closed fracture with routine healing: Secondary | ICD-10-CM

## 2019-05-04 DIAGNOSIS — F0391 Unspecified dementia with behavioral disturbance: Secondary | ICD-10-CM | POA: Diagnosis not present

## 2019-05-04 DIAGNOSIS — F419 Anxiety disorder, unspecified: Secondary | ICD-10-CM

## 2019-05-04 DIAGNOSIS — M150 Primary generalized (osteo)arthritis: Secondary | ICD-10-CM | POA: Diagnosis not present

## 2019-05-04 DIAGNOSIS — I130 Hypertensive heart and chronic kidney disease with heart failure and stage 1 through stage 4 chronic kidney disease, or unspecified chronic kidney disease: Secondary | ICD-10-CM | POA: Diagnosis not present

## 2019-05-04 DIAGNOSIS — I10 Essential (primary) hypertension: Secondary | ICD-10-CM

## 2019-05-04 DIAGNOSIS — I5033 Acute on chronic diastolic (congestive) heart failure: Secondary | ICD-10-CM | POA: Diagnosis not present

## 2019-05-04 NOTE — Progress Notes (Signed)
This service is provided via telemedicine  No vital signs collected/recorded due to the encounter was a telemedicine visit.   Location of patient (ex: home, work):  Home  Patient consents to a telephone visit:  Yes  Location of the provider (ex: office, home): [facility], Office   Name of any referring provider:  N/A  Names of all persons participating in the telemedicine service and their role in the encounter:  S.Chrae B/CMA, Sherrie Mustache, NP, daughter [name]) and Patient   Time spent on call:  6 min with medical assistant     Careteam: Patient Care Team: Lauree Chandler, NP as PCP - General (Nurse Practitioner) Fay Records, MD as PCP - Cardiology (Cardiology) Tanda Rockers, MD as Consulting Physician (Pulmonary Disease) Conan Bowens, RN as Registered Nurse (Hospice and Palliative Medicine) Hilarie Fredrickson Lajuan Lines, MD as Consulting Physician (Gastroenterology)  Advanced Directive information Does Patient Have a Medical Advance Directive?: Yes, Type of Advance Directive: Healthcare Power of Attorney  Allergies  Allergen Reactions  . Lidocaine Anaphylaxis, Swelling, Rash and Other (See Comments)    Any of the " [facility] "  . Other Other (See Comments)    All drugs that end in "cane'-  novacane etc. Daughter states patient almost died when she had it when she was born    Chief Complaint  Patient presents with  . Acute Visit    Behavior concerns. Telephone visit      HPI: Patient is a 83 y.o. female due to increase in agitation.  Did not get any medication until late yesterday.  Took xanax 0.25 mg by mouth 1/2 tablet yesterday, after 3 hours it had worn off and she was very combative and agitation at night.  Thought she was not supposed to give it at night.  Overall alprazolam worked well and did not have side effects, have been given 2 half tablet dose.  Has not wanted to take the mucous or the albuterol.  Started early and tired to get  her to take medication but she refused all medication. Refuses to let them place the O2 on. She has taken the mucinex for 7 days, no cough or congestion at this time.  Thought they was finally able to get medication in around 8:30 this morning but found it later in the bed. Finally checked her pulse Ox it was 98% and she had O2 on.  Home health nurse came today- changed dressing 120/60 HR 83 97% Once o2 is off pulse ox dropped to 85% Plans to be transported by ambulance to see orthopedic later this week, Complaining more of pain. Moving around more and being more active.  Reports alprazolam was given today at 10:05 today - very concerned due to her being combative. Daughter reports she is getting great exercise trying to hit and kick. Once alprazolam was given it was very beneficial.   Hypertension- not taking norvasc, talking blood pressure and has been on the low side, holding when blood pressure less than 105  Review of Systems:  Review of Systems  Unable to perform ROS: Dementia    Past Medical History:  Diagnosis Date  . Anxiety 07/08/2017  . Arthritis   . Carotid artery disease ([place])    a. s/p L CEA. b. followed by [facility].  . Carotid stenosis 10/19/2012  . Chronic diastolic CHF (congestive heart failure) ([place])   . CKD (chronic kidney disease), stage III ([place])   . Congestive heart disease ([place])   .  COPD (chronic obstructive pulmonary disease) ([place])   . COPD GOLD IV 12/30/2014   PFTs 12/15/14 FEV1  0.57 (29%) ratio 54 p 17% resp to saba    . Cough 01/31/2016  . Dementia with behavioral disturbance ([place]) 07/08/2017  . Diverticulosis   . Dyspnea   . Essential hypertension 12/02/2014  . Fall at home Sept. 2013  . Family history of adverse reaction to anesthesia    SON IS SLOW TO WAKE   . Gastroesophageal reflux disease 07/08/2017  . Hyperlipidemia   . Hypertension   . Hyponatremia 12/02/2014  . Hypotension 02/01/2017  . Incontinent of urine   . Internal hemorrhoid   . Intertrochanteric  fracture ([place]) 04/20/2019  . Memory disorder 10/24/2014  . Nausea and vomiting 11/30/2014  . Primary osteoarthritis involving multiple joints 07/08/2017  . Vitamin D deficiency 02/15/2015   Past Surgical History:  Procedure Laterality Date  . APPENDECTOMY    . BREAST REDUCTION SURGERY    . CAROTID ENDARTERECTOMY     left CEA  . CATARACT EXTRACTION Bilateral   . COLONOSCOPY  2015  . EYE SURGERY     cataracts removed, bilaterally   . INTRAMEDULLARY (IM) NAIL INTERTROCHANTERIC Right 04/21/2019   Procedure: INTRAMEDULLARY (IM) NAIL INTERTROCHANTRIC;  Surgeon: Rod Can, MD;  Location: [facility];  Service: Orthopedics;  Laterality: Right;  . skin cancer removal    . TONSILLECTOMY     Social History:   reports that she quit smoking about 4 weeks ago. Her smoking use included cigarettes. She has a 5.00 pack-year smoking history. She has never used smokeless tobacco. She reports that she does not drink alcohol or use drugs.  Family History  Problem Relation Age of Onset  . Heart disease Father        Before age 48  . Hypertension Father   . Heart attack Father   . Cancer Mother 20       Brain  . Dementia Brother   . Cancer Maternal Aunt 61       breast cancer  . Diabetes Maternal Aunt   . Heart failure Maternal Grandmother   . Diabetes Maternal Grandmother   . Stroke Neg Hx     Medications: Patient's Medications  New Prescriptions   No medications on file  Previous Medications   ALBUTEROL (PROVENTIL) (2.5 MG/3ML) 0.083% NEBULIZER SOLUTION    Take 6 mLs (5 mg total) by nebulization every 4 (four) hours as needed for wheezing or shortness of breath.   ALPRAZOLAM (XANAX) 0.25 MG TABLET    0.5-1 tablet every 6 hours as needed severe anxiety, not to exceed 2 tablet in 24 hours   AMLODIPINE (NORVASC) 5 MG TABLET    Take 1 tablet (5 mg total) by mouth daily.   ARTIFICIAL TEAR OP    Place 2 drops into both eyes daily.    AZITHROMYCIN (ZITHROMAX) 250 MG TABLET    Take 1 tablet 250 mg  daily x4 days   CARBINOXAMINE MALEATE 4 MG TABS    Take 1 tablet (4 mg total) by mouth every 8 (eight) hours as needed.   CHOLECALCIFEROL (D3 SUPER STRENGTH) 2000 UNITS CAPS    Take 1 capsule (2,000 Units total) by mouth daily.   DICLOFENAC SODIUM (VOLTAREN) 1 % GEL    APPLY TO AFFECTED AREA TWICE A DAY IF NEEDED FOR SHOULDER AND KNEE PAIN   DOCUSATE SODIUM (COLACE) 100 MG CAPSULE    Take 1 capsule (100 mg total) by mouth 2 (two) times daily.  DONEPEZIL (ARICEPT) 10 MG TABLET    TAKE 1 TABLET BY MOUTH EVERY DAY   ENOXAPARIN (LOVENOX) 40 MG/0.4ML INJECTION    Inject 0.4 mLs (40 mg total) into the skin daily.   FLUTICASONE (FLONASE) 50 MCG/ACT NASAL SPRAY    USE 2 SPRAYS IN EACH NOSTRIL DAILY   FLUTICASONE FUROATE-VILANTEROL (BREO ELLIPTA) 100-25 MCG/INH AEPB    Inhale 1 puff into the lungs daily.   FUROSEMIDE (LASIX) 20 MG TABLET    Take 1 tablet (20 mg total) by mouth daily.   GUAIFENESIN (MUCINEX) 600 MG 12 HR TABLET    Take by mouth 2 (two) times daily.   HYDROCODONE-ACETAMINOPHEN (NORCO/VICODIN) 5-325 MG TABLET    Take 1-2 tablets by mouth every 6 (six) hours as needed for moderate pain.   IPRATROPIUM (ATROVENT) 0.06 % NASAL SPRAY    USE 2 SPRAYS IN EACH NOSTRIL TWICE DAILY   IPRATROPIUM-ALBUTEROL (DUONEB) 0.5-2.5 (3) MG/3ML SOLN    inhale contents of 1 vial in nebulizer every 6 hours   MEMANTINE (NAMENDA) 10 MG TABLET    Take 1 tablet (10 mg total) by mouth 2 (two) times daily.   MENTHOL, TOPICAL ANALGESIC, (BIOFREEZE) 4 % GEL    Apply topically as needed.   NEBIVOLOL (BYSTOLIC) 5 MG TABLET    TAKE 0.5 TABLET BY MOUTH EVERY DAY. HOLD A DOSE IF SYSTOLIC BLOOD PRESSURE LESS THAN 105. IF SYSTOLIC BLOOD PRESSURE OVER 150, GIVE 1 WHOLE   ONDANSETRON (ZOFRAN-ODT) 4 MG DISINTEGRATING TABLET    DISSOLVE 1 TABLET ON THE TONGUE IN THE MORNING   PANTOPRAZOLE (PROTONIX) 40 MG TABLET    Take 40 mg by mouth 2 (two) times daily.   SERTRALINE (ZOLOFT) 50 MG TABLET    Take 1.5 tablets (75 mg total) by mouth  daily.  Modified Medications   No medications on file  Discontinued Medications   No medications on file    Physical Exam:  There were no vitals filed for this visit. There is no height or weight on file to calculate BMI. Wt Readings from Last 3 Encounters:  04/21/19 178 lb (80.7 kg)  12/14/18 190 lb (86.2 kg)  12/07/18 187 lb 1.9 oz (84.9 kg)      Labs reviewed: Basic Metabolic Panel: Recent Labs    06/21/18 0000 08/30/18 1423  04/22/19 0112 04/25/19 1110 04/26/19 1045  NA 142 141   < > 142 142 144  K 4.1 4.8   < > 4.2 3.6 3.4*  CL 104 99   < > 107 104 105  CO2 24 27   < > 26 27 28   GLUCOSE 97 131*   < > 127* 117* 114*  BUN 17 20   < > 14 18 15   CREATININE 1.16* 1.39*   < > 0.94 0.74 0.76  CALCIUM 8.6* 8.7   < > 8.4* 8.5* 8.8*  TSH 0.603 0.422*  --   --   --   --    < > = values in this interval not displayed.   Liver Function Tests: Recent Labs    06/21/18 0000 08/30/18 1423  AST 20 19  ALT 15 13  ALKPHOS 56 67  BILITOT 1.2 0.8  PROT 5.5* 5.8*  ALBUMIN 3.3* 3.8   No results for input(s): LIPASE, AMYLASE in the last 8760 hours. No results for input(s): AMMONIA in the last 8760 hours. CBC: Recent Labs    04/20/19 0456 04/21/19 0747 04/22/19 0112 04/25/19 1110  WBC 10.7* 10.4 9.9 8.3  NEUTROABS  8.0*  --   --   --   HGB 13.6 12.3 10.0* 9.2*  HCT 43.2 40.0 31.7* 28.9*  MCV 94.3 96.2 94.3 93.2  PLT 234 185 191 230   Lipid Panel: Recent Labs    06/21/18 0000  CHOL 189  HDL 58  LDLCALC 103*  TRIG 140  CHOLHDL 3.3   TSH: Recent Labs    06/21/18 0000 08/30/18 1423  TSH 0.603 0.422*   A1C: Lab Results  Component Value Date   HGBA1C 5.2 12/10/2017     Assessment/Plan 1. Anxiety Ongoing, cont increase dose zoloft at 75 mg daily. Discuss used of alprazolam 0.25 mg 0.5-1 tablet every 6 hours as needed anxiety. Encouraged to group activities around medication administration to help overall anxiety and pain without adding additional doses  of medication if possible to reduce side effects from these medications.   2. Dementia with behavioral disturbance, unspecified dementia type ([place]) Ongoing behaviors due to anxiety and pain with recent fracture. Requiring more assistance at home. Daughter staying with her and helping her at this time.   3. Acute bronchitis with COPD ([place]) Improving. Having a hard time giving mucinex and To stop routine mucinex and give as needed to continue nebs PRN at this time.  -continue breo daily  4. Closed displaced intertrochanteric fracture of right femur with routine healing, subsequent encounter Pain controlled on hydrocodone-apap and tylenol, not using over 3000 mg of tylenol in 24 hours. Continues with home health therapies and using lovenox injection for DVT prophylactic. Has follow up with orthopedic scheduled this week.   5. Essential hypertension -not taking norvasc and blood pressure remains controlled on bystolic only. She actually is having some low blood pressure therefore bystolic has been held for SBP <105. To continue current regimen at this time.  -norvasc removed from medication list.   Next appt: 05/30/2019, sooner if needed  Janett Billow K. Harle Battiest  [facility] [phone]   Virtual Visit via Video Note  I connected with [name] on 05/04/19 at  2:15 PM EDT by a video enabled telemedicine application and verified that I am speaking with the correct person using two identifiers.  Location: Patient: home Provider: office   I discussed the limitations of evaluation and management by telemedicine and the availability of in person appointments. The patient expressed understanding and agreed to proceed.    I discussed the assessment and treatment plan with the patient. The patient was provided an opportunity to ask questions and all were answered. The patient agreed with the plan and demonstrated an understanding of the instructions.   The  patient was advised to call back or seek an in-person evaluation if the symptoms worsen or if the condition fails to improve as anticipated.  I provided 26 minutes of non-face-to-face time during this encounter.  Carlos American. Dewaine Oats, AGNP Avs printed and mailed.

## 2019-05-04 NOTE — Telephone Encounter (Signed)
Received a call from [place] regarding patient. Instructed to put patient down for TeleVisit today with Janett Billow.  Daughter notified and TeleVisit scheduled for this afternoon.

## 2019-05-04 NOTE — Telephone Encounter (Signed)
Received fax from [place] and [facility] was DENIED.  Placed fax in [facility] folder to review.

## 2019-05-04 NOTE — Telephone Encounter (Signed)
ManX Mast, NP with our office was on call last night and received a call from patient's daughter. ManX states patient's daughter was upset and demanding to get a message to [place] (PCP).  Patient is refusing to wear her oxygen, refusing to eat, and daughter is requesting medication/injection for patient. ManX states patient is home following a fracture and its a lot for the family to handle.   Please advise

## 2019-05-06 ENCOUNTER — Telehealth: Payer: Self-pay | Admitting: *Deleted

## 2019-05-06 DIAGNOSIS — I130 Hypertensive heart and chronic kidney disease with heart failure and stage 1 through stage 4 chronic kidney disease, or unspecified chronic kidney disease: Secondary | ICD-10-CM | POA: Diagnosis not present

## 2019-05-06 DIAGNOSIS — M150 Primary generalized (osteo)arthritis: Secondary | ICD-10-CM | POA: Diagnosis not present

## 2019-05-06 DIAGNOSIS — S72141D Displaced intertrochanteric fracture of right femur, subsequent encounter for closed fracture with routine healing: Secondary | ICD-10-CM | POA: Diagnosis not present

## 2019-05-06 DIAGNOSIS — J440 Chronic obstructive pulmonary disease with acute lower respiratory infection: Secondary | ICD-10-CM | POA: Diagnosis not present

## 2019-05-06 DIAGNOSIS — J209 Acute bronchitis, unspecified: Secondary | ICD-10-CM | POA: Diagnosis not present

## 2019-05-06 DIAGNOSIS — I5033 Acute on chronic diastolic (congestive) heart failure: Secondary | ICD-10-CM | POA: Diagnosis not present

## 2019-05-06 NOTE — Telephone Encounter (Signed)
Trevose Verified LR: 04/26/19- # 48  Daughter stated it will run out over the weekend. Only given her 1/2 tablets now every 3-4 hours.

## 2019-05-06 NOTE — Telephone Encounter (Signed)
1. [name], daughter called and stated that patient needs a refill on her Hydrocodone.  Pended Rx and sent to [facility] for approval.  [facility] Database Verified LR: 04/26/19   2. Also stated that patient was having some constipation and wanted to know if they could give her Miralax along with the Colace. Also wants to know if a suppository is needed to help clean her out. Please Advise.

## 2019-05-06 NOTE — Telephone Encounter (Signed)
Patient daughter notified and agreed.  Removed Pended Rx. Daughter will call Orthopaedic Dr.

## 2019-05-06 NOTE — Telephone Encounter (Signed)
She is getting her pain medication through the orthopedic, they have an appt today so recommend asking them for a refill. Okay to use miralax daily to help with constipation

## 2019-05-09 ENCOUNTER — Telehealth: Payer: Self-pay | Admitting: *Deleted

## 2019-05-09 DIAGNOSIS — M150 Primary generalized (osteo)arthritis: Secondary | ICD-10-CM | POA: Diagnosis not present

## 2019-05-09 DIAGNOSIS — J440 Chronic obstructive pulmonary disease with acute lower respiratory infection: Secondary | ICD-10-CM | POA: Diagnosis not present

## 2019-05-09 DIAGNOSIS — J209 Acute bronchitis, unspecified: Secondary | ICD-10-CM | POA: Diagnosis not present

## 2019-05-09 DIAGNOSIS — S72141D Displaced intertrochanteric fracture of right femur, subsequent encounter for closed fracture with routine healing: Secondary | ICD-10-CM | POA: Diagnosis not present

## 2019-05-09 DIAGNOSIS — I130 Hypertensive heart and chronic kidney disease with heart failure and stage 1 through stage 4 chronic kidney disease, or unspecified chronic kidney disease: Secondary | ICD-10-CM | POA: Diagnosis not present

## 2019-05-09 DIAGNOSIS — I5033 Acute on chronic diastolic (congestive) heart failure: Secondary | ICD-10-CM | POA: Diagnosis not present

## 2019-05-09 NOTE — Telephone Encounter (Signed)
[name], daughter called and stated that patient is getting picked up by EMS tomorrow to go to a Dr. Hilaria Ota.  Daughter stated that she has not been giving patient her Alprazolam since Saturday due to Low BP and has not been giving the Bystolic since last Wednesday. Daughter is wanting to know if it will be ok to give the Alprazolam tomorrow to keep patient calm since she has to go to a appointment in ambulance and wear a mask. Please Advise.   05/05/19- 5:58pm 102/74  8:31pm 113/57 05/06/19- 7:42am 130/77  1:53pm 95/64  5:30pm 104/55  8:40pm 94/67 05/07/19- 9:36am 124/74  1:53pm 95/64  5:30pm 104/55  8:40pm 94/67 05/08/19- 8:12am 107/65  6:52pm 118/67  11:18pm 125/69 05/09/19- 8:40am 127/80

## 2019-05-09 NOTE — Telephone Encounter (Signed)
Okay to give alprazolam prior to appt.

## 2019-05-09 NOTE — Telephone Encounter (Signed)
Daughter notified and agreed.  

## 2019-05-09 NOTE — Telephone Encounter (Signed)
[name] with [name] called requesting verbal orders for a Social Worker to give more support in home. Verbal order given.

## 2019-05-10 ENCOUNTER — Ambulatory Visit (INDEPENDENT_AMBULATORY_CARE_PROVIDER_SITE_OTHER): Payer: Medicare Other | Admitting: Nurse Practitioner

## 2019-05-10 ENCOUNTER — Other Ambulatory Visit (HOSPITAL_COMMUNITY): Payer: Self-pay | Admitting: Orthopedic Surgery

## 2019-05-10 ENCOUNTER — Other Ambulatory Visit: Payer: Self-pay

## 2019-05-10 ENCOUNTER — Encounter: Payer: Self-pay | Admitting: Nurse Practitioner

## 2019-05-10 ENCOUNTER — Other Ambulatory Visit: Payer: Self-pay | Admitting: Orthopedic Surgery

## 2019-05-10 DIAGNOSIS — J449 Chronic obstructive pulmonary disease, unspecified: Secondary | ICD-10-CM | POA: Diagnosis not present

## 2019-05-10 DIAGNOSIS — K219 Gastro-esophageal reflux disease without esophagitis: Secondary | ICD-10-CM | POA: Diagnosis not present

## 2019-05-10 DIAGNOSIS — M4856XA Collapsed vertebra, not elsewhere classified, lumbar region, initial encounter for fracture: Secondary | ICD-10-CM | POA: Diagnosis not present

## 2019-05-10 DIAGNOSIS — F0391 Unspecified dementia with behavioral disturbance: Secondary | ICD-10-CM | POA: Diagnosis not present

## 2019-05-10 DIAGNOSIS — F419 Anxiety disorder, unspecified: Secondary | ICD-10-CM

## 2019-05-10 DIAGNOSIS — M545 Low back pain: Secondary | ICD-10-CM | POA: Diagnosis not present

## 2019-05-10 DIAGNOSIS — I10 Essential (primary) hypertension: Secondary | ICD-10-CM

## 2019-05-10 DIAGNOSIS — M2550 Pain in unspecified joint: Secondary | ICD-10-CM | POA: Diagnosis not present

## 2019-05-10 DIAGNOSIS — S72141D Displaced intertrochanteric fracture of right femur, subsequent encounter for closed fracture with routine healing: Secondary | ICD-10-CM | POA: Diagnosis not present

## 2019-05-10 DIAGNOSIS — Z7189 Other specified counseling: Secondary | ICD-10-CM

## 2019-05-10 DIAGNOSIS — Z7401 Bed confinement status: Secondary | ICD-10-CM | POA: Diagnosis not present

## 2019-05-10 NOTE — Progress Notes (Signed)
This service is provided via telemedicine  No vital signs collected/recorded due to the encounter was a telemedicine visit.   Location of patient (ex: home, work):  Home  Patient consents to a telephone visit: Yes  Location of the provider (ex: office, home):  [facility], Office   Name of any referring provider: N/A   Names of all persons participating in the telemedicine service and their role in the encounter: S.Chrae B/CMA, Sherrie Mustache, NP, Daughter [name]) and Patient   Time spent on call: 4 min with medical assistant      Careteam: Patient Care Team: Lauree Chandler, NP as PCP - General (Nurse Practitioner) Fay Records, MD as PCP - Cardiology (Cardiology) Tanda Rockers, MD as Consulting Physician (Pulmonary Disease) Conan Bowens, RN as Registered Nurse (Hospice and Palliative Medicine) Hilarie Fredrickson, Lajuan Lines, MD as Consulting Physician (Gastroenterology)  Advanced Directive information    Allergies  Allergen Reactions   Lidocaine Anaphylaxis, Swelling, Rash and Other (See Comments)    Any of the " [facility] "   Other Other (See Comments)    All drugs that end in "cane'-  novacane etc. Daughter states patient almost died when she had it when she was born    Chief Complaint  Patient presents with   Acute Visit    Low B/P concerns, fractured back, behavioral changes, heel issues, and discuss alprazolam. Facetime visit     HPI: Patient is a 83 y.o. female due to multiple concerns.   Daughter reports she is refusing heel care-area is red. Attempting to keep elevated in the air and using heel protection.  Refused blood pressure, night time medication, declined changing clothes. Continues to be combative and refusing care frequently.   Took half tylenol, half hydrocodone and half alprazolam this morning at 8:35 and her blood pressure was 130/89 on the 6th day on no bystolic HR 144.  Ambulance came this morning to take her to her  orthopedic appt 110/78 98% on 1.5L 124/98, HR 110 98% 1.5 al HR ranging from 81-110   Ongoing pain to hip and back. Noted to have fracture of lumbar spine. Unsure if this is old or new and plan to get MRI. If new fracture they will get spinal specialist.  Hip xray looked good.  Knee is bowed in due to severe arthritis.   Taking medication in applesauce sometimes taking whole.  Eating well.  Bowels are moving- soft good movements.   Doing some PT In the bed, refusing PT when the therapist was there. Now has PT on hold due to lumbar fracture.   GERD- ongoing nausea and GERD like symptoms. Using zofran daily.   htn- blood pressure has been low and holding bystolic.   Anxiety- doing well with alprazolam, not oversedating her but keeps her calm. Tolerating increase in zoloft.   Review of Systems:  Review of Systems  Unable to perform ROS: Dementia   Past Medical History:  Diagnosis Date   Anxiety 07/08/2017   Arthritis    Carotid artery disease ([place])    a. s/p L CEA. b. followed by [facility].   Carotid stenosis 10/19/2012   Chronic diastolic CHF (congestive heart failure) (HCC)    CKD (chronic kidney disease), stage III (HCC)    Congestive heart disease (HCC)    COPD (chronic obstructive pulmonary disease) (HCC)    COPD GOLD IV 12/30/2014   PFTs 12/15/14 FEV1  0.57 (29%) ratio 54 p 17% resp to saba  Cough 01/31/2016   Dementia with behavioral disturbance ([place]) 07/08/2017   Diverticulosis    Dyspnea    Essential hypertension 12/02/2014   Fall at home Sept. 2013   Family history of adverse reaction to anesthesia    SON IS SLOW TO WAKE    Gastroesophageal reflux disease 07/08/2017   Hyperlipidemia    Hypertension    Hyponatremia 12/02/2014   Hypotension 02/01/2017   Incontinent of urine    Internal hemorrhoid    Intertrochanteric fracture ([place]) 04/20/2019   Memory disorder 10/24/2014   Nausea and vomiting 11/30/2014   Primary osteoarthritis involving multiple  joints 07/08/2017   Vitamin D deficiency 02/15/2015   Past Surgical History:  Procedure Laterality Date   APPENDECTOMY     BREAST REDUCTION SURGERY     CAROTID ENDARTERECTOMY     left CEA   CATARACT EXTRACTION Bilateral    COLONOSCOPY  2015   EYE SURGERY     cataracts removed, bilaterally    INTRAMEDULLARY (IM) NAIL INTERTROCHANTERIC Right 04/21/2019   Procedure: INTRAMEDULLARY (IM) NAIL INTERTROCHANTRIC;  Surgeon: Rod Can, MD;  Location: [facility];  Service: Orthopedics;  Laterality: Right;   skin cancer removal     TONSILLECTOMY     Social History:   reports that she quit smoking about 5 weeks ago. Her smoking use included cigarettes. She has a 5.00 pack-year smoking history. She has never used smokeless tobacco. She reports that she does not drink alcohol or use drugs.  Family History  Problem Relation Age of Onset   Heart disease Father        Before age 2   Hypertension Father    Heart attack Father    Cancer Mother 31       Brain   Dementia Brother    Cancer Maternal Aunt 61       breast cancer   Diabetes Maternal Aunt    Heart failure Maternal Grandmother    Diabetes Maternal Grandmother    Stroke Neg Hx     Medications: Patient's Medications  New Prescriptions   No medications on file  Previous Medications   ALBUTEROL (PROVENTIL) (2.5 MG/3ML) 0.083% NEBULIZER SOLUTION    Take 6 mLs (5 mg total) by nebulization every 4 (four) hours as needed for wheezing or shortness of breath.   ALPRAZOLAM (XANAX) 0.25 MG TABLET    0.5-1 tablet every 6 hours as needed severe anxiety, not to exceed 2 tablet in 24 hours   ARTIFICIAL TEAR OP    Place 2 drops into both eyes daily.    CARBINOXAMINE MALEATE 4 MG TABS    Take 1 tablet (4 mg total) by mouth every 8 (eight) hours as needed.   CHOLECALCIFEROL (D3 SUPER STRENGTH) 2000 UNITS CAPS    Take 1 capsule (2,000 Units total) by mouth daily.   DICLOFENAC SODIUM (VOLTAREN) 1 % GEL    APPLY TO AFFECTED AREA TWICE  A DAY IF NEEDED FOR SHOULDER AND KNEE PAIN   DOCUSATE SODIUM (COLACE) 100 MG CAPSULE    Take 1 capsule (100 mg total) by mouth 2 (two) times daily.   DONEPEZIL (ARICEPT) 10 MG TABLET    TAKE 1 TABLET BY MOUTH EVERY DAY   ENOXAPARIN (LOVENOX) 40 MG/0.4ML INJECTION    Inject 0.4 mLs (40 mg total) into the skin daily.   FLUTICASONE (FLONASE) 50 MCG/ACT NASAL SPRAY    USE 2 SPRAYS IN EACH NOSTRIL DAILY   FLUTICASONE FUROATE-VILANTEROL (BREO ELLIPTA) 100-25 MCG/INH AEPB    Inhale 1  puff into the lungs daily.   FUROSEMIDE (LASIX) 20 MG TABLET    Take 1 tablet (20 mg total) by mouth daily.   GUAIFENESIN (MUCINEX) 600 MG 12 HR TABLET    Take by mouth 2 (two) times daily.   HYDROCODONE-ACETAMINOPHEN (NORCO/VICODIN) 5-325 MG TABLET    Take 1-2 tablets by mouth every 6 (six) hours as needed for moderate pain.   IPRATROPIUM (ATROVENT) 0.06 % NASAL SPRAY    USE 2 SPRAYS IN EACH NOSTRIL TWICE DAILY   IPRATROPIUM-ALBUTEROL (DUONEB) 0.5-2.5 (3) MG/3ML SOLN    inhale contents of 1 vial in nebulizer every 6 hours   MEMANTINE (NAMENDA) 10 MG TABLET    Take 1 tablet (10 mg total) by mouth 2 (two) times daily.   MENTHOL, TOPICAL ANALGESIC, (BIOFREEZE) 4 % GEL    Apply topically as needed.   NEBIVOLOL (BYSTOLIC) 5 MG TABLET    TAKE 0.5 TABLET BY MOUTH EVERY DAY. HOLD A DOSE IF SYSTOLIC BLOOD PRESSURE LESS THAN 105. IF SYSTOLIC BLOOD PRESSURE OVER 150, GIVE 1 WHOLE   ONDANSETRON (ZOFRAN-ODT) 4 MG DISINTEGRATING TABLET    DISSOLVE 1 TABLET ON THE TONGUE IN THE MORNING   PANTOPRAZOLE (PROTONIX) 40 MG TABLET    Take 40 mg by mouth 2 (two) times daily.   SERTRALINE (ZOLOFT) 50 MG TABLET    Take 1.5 tablets (75 mg total) by mouth daily.  Modified Medications   No medications on file  Discontinued Medications   AZITHROMYCIN (ZITHROMAX) 250 MG TABLET    Take 1 tablet 250 mg daily x4 days    Physical Exam:  There were no vitals filed for this visit. There is no height or weight on file to calculate BMI. Wt Readings from  Last 3 Encounters:  04/21/19 178 lb (80.7 kg)  12/14/18 190 lb (86.2 kg)  12/07/18 187 lb 1.9 oz (84.9 kg)     Labs reviewed: Basic Metabolic Panel: Recent Labs    06/21/18 0000 08/30/18 1423  04/22/19 0112 04/25/19 1110 04/26/19 1045  NA 142 141   < > 142 142 144  K 4.1 4.8   < > 4.2 3.6 3.4*  CL 104 99   < > 107 104 105  CO2 24 27   < > 26 27 28   GLUCOSE 97 131*   < > 127* 117* 114*  BUN 17 20   < > 14 18 15   CREATININE 1.16* 1.39*   < > 0.94 0.74 0.76  CALCIUM 8.6* 8.7   < > 8.4* 8.5* 8.8*  TSH 0.603 0.422*  --   --   --   --    < > = values in this interval not displayed.   Liver Function Tests: Recent Labs    06/21/18 0000 08/30/18 1423  AST 20 19  ALT 15 13  ALKPHOS 56 67  BILITOT 1.2 0.8  PROT 5.5* 5.8*  ALBUMIN 3.3* 3.8   No results for input(s): LIPASE, AMYLASE in the last 8760 hours. No results for input(s): AMMONIA in the last 8760 hours. CBC: Recent Labs    04/20/19 0456 04/21/19 0747 04/22/19 0112 04/25/19 1110  WBC 10.7* 10.4 9.9 8.3  NEUTROABS 8.0*  --   --   --   HGB 13.6 12.3 10.0* 9.2*  HCT 43.2 40.0 31.7* 28.9*  MCV 94.3 96.2 94.3 93.2  PLT 234 185 191 230   Lipid Panel: Recent Labs    06/21/18 0000  CHOL 189  HDL 58  LDLCALC 103*  TRIG  140  CHOLHDL 3.3   TSH: Recent Labs    06/21/18 0000 08/30/18 1423  TSH 0.603 0.422*   A1C: Lab Results  Component Value Date   HGBA1C 5.2 12/10/2017     Assessment/Plan 1. Dementia with behavioral disturbance, unspecified dementia type ([place]) Ongoing behaviors which are stable. Living at home with daughter and son and caregivers. Continues on namenda and aricept  2. Closed displaced intertrochanteric fracture of right femur with routine healing, subsequent encounter Ongoing follow up with orthopedics, pain is controlled. Currently refusing therapy but daughter and other home aides are helping.   3. Gastroesophageal reflux disease without esophagitis Ongoing nausea and GERD,  continues on protonix with zofran PRN.  4. COPD GOLD IV Stable, continues to need O2. Continues on breo and nebulizer PRN  5. Essential hypertension Blood pressure has been low after alprazolam and pain medication, holding bystolic at this time.   6. Advanced care planning/counseling discussion SW has been consulted. She is currently a full code but daughter plans on discussing this with her brother as pt has advanced dementia and refusing a lot of care due to increase in anxiety.   7. Anxiety Continues on zoloft 75 mg by mouth daily. Encouraged use of alprazolam as this has significantly helped with anxiety without side effects at this time. Alprazolam has helped with care and her being agitation and combative.    Next appt: 05/30/2019 Carlos American. Melo Stauber, [facility] [phone]   Virtual Visit via Video Note  I connected with pt and daughter CHIDINMA CLITES on 05/10/19 at  3:15 PM EDT by a video enabled telemedicine application and verified that I am speaking with the correct person using two identifiers.  Location: Patient: home Provider: office   I discussed the limitations of evaluation and management by telemedicine and the availability of in person appointments. The patient expressed understanding and agreed to proceed.    I discussed the assessment and treatment plan with the patient. The patient was provided an opportunity to ask questions and all were answered. The patient agreed with the plan and demonstrated an understanding of the instructions.   The patient was advised to call back or seek an in-person evaluation if the symptoms worsen or if the condition fails to improve as anticipated.  I provided 26 minutes of non-face-to-face time during this encounter.  Carlos American. Dewaine Oats, AGNP Avs printed and mailed.

## 2019-05-11 ENCOUNTER — Other Ambulatory Visit: Payer: Self-pay

## 2019-05-11 ENCOUNTER — Ambulatory Visit (INDEPENDENT_AMBULATORY_CARE_PROVIDER_SITE_OTHER): Payer: Medicare Other | Admitting: Nurse Practitioner

## 2019-05-11 ENCOUNTER — Telehealth: Payer: Self-pay

## 2019-05-11 DIAGNOSIS — K5903 Drug induced constipation: Secondary | ICD-10-CM

## 2019-05-11 DIAGNOSIS — I5033 Acute on chronic diastolic (congestive) heart failure: Secondary | ICD-10-CM | POA: Diagnosis not present

## 2019-05-11 DIAGNOSIS — I10 Essential (primary) hypertension: Secondary | ICD-10-CM | POA: Diagnosis not present

## 2019-05-11 DIAGNOSIS — R140 Abdominal distension (gaseous): Secondary | ICD-10-CM | POA: Diagnosis not present

## 2019-05-11 DIAGNOSIS — S72141D Displaced intertrochanteric fracture of right femur, subsequent encounter for closed fracture with routine healing: Secondary | ICD-10-CM | POA: Diagnosis not present

## 2019-05-11 DIAGNOSIS — I130 Hypertensive heart and chronic kidney disease with heart failure and stage 1 through stage 4 chronic kidney disease, or unspecified chronic kidney disease: Secondary | ICD-10-CM | POA: Diagnosis not present

## 2019-05-11 DIAGNOSIS — J209 Acute bronchitis, unspecified: Secondary | ICD-10-CM | POA: Diagnosis not present

## 2019-05-11 DIAGNOSIS — M150 Primary generalized (osteo)arthritis: Secondary | ICD-10-CM | POA: Diagnosis not present

## 2019-05-11 DIAGNOSIS — J440 Chronic obstructive pulmonary disease with acute lower respiratory infection: Secondary | ICD-10-CM | POA: Diagnosis not present

## 2019-05-11 NOTE — Telephone Encounter (Signed)
Message left on clinical intake voicemail:   Per [name] (daughter) patient had a stomach ache and she gave her miralax. Now patient with severe abdominal pain and stomach ache. Please call  Patient's son [name] also stopped by the office and spoke with Lattie Haw (registration) stating someone needed to call them ASAP.   I called Virginia [name] and offered 12 pm appointment for today, telephone visit. Virginia [name] agreed

## 2019-05-11 NOTE — Telephone Encounter (Signed)
Visit complete.

## 2019-05-11 NOTE — Patient Instructions (Signed)
Can use simethicone as needed for gas pain

## 2019-05-11 NOTE — Progress Notes (Signed)
This service is provided via telemedicine  No vital signs collected/recorded due to the encounter was a telemedicine visit.   Location of patient (ex: home, work):  Home   Patient consents to a telephone visit:  Yes  Location of the provider (ex: office, home):  [facility], Office   Name of any referring provider:  N/A  Names of all persons participating in the telemedicine service and their role in the encounter:  S.Chrae B/CMA, Sherrie Mustache, NP, brother [name]) and daughter ([name])Patient   Time spent on call:  4 min with medical assistant     Careteam: Patient Care Team: Lauree Chandler, NP as PCP - General (Nurse Practitioner) Fay Records, MD as PCP - Cardiology (Cardiology) Tanda Rockers, MD as Consulting Physician (Pulmonary Disease) Conan Bowens, RN as Registered Nurse (Hospice and Palliative Medicine) Hilarie Fredrickson Lajuan Lines, MD as Consulting Physician (Gastroenterology)  Advanced Directive information    Allergies  Allergen Reactions  . Lidocaine Anaphylaxis, Swelling, Rash and Other (See Comments)    Any of the " [facility] "  . Other Other (See Comments)    All drugs that end in "cane'-  novacane etc. Daughter states patient almost died when she had it when she was born    Chief Complaint  Patient presents with  . Acute Visit    Severe abdominal pain. High pulse rate (100 and 97). Patient's B/P 110/62. Patient is Pharmacist, community. Facetime visit.       HPI: Patient is a 83 y.o. female due to abdominal pain.  Video visit done. Denies nausea or vomiting. Ate breakfast this morning.  Added miralax today due to not having a good BM- continues colace BID  Daughter reports her belly had been distended and felt like a rock was at the top.  Small soft bowel movements in the past but not had a good BM in the last few days.  After miralax she began crying out stating "belly hurts" "do not touch" Daughter placed a warm compress on belly  which helped.  Now belly is soft.  Daughter reports she belched twice and then felt better.  97.3 temperature Over visit pt reports she feels better.   Social worker is coming today at 13.   HR 100 then 97 BP 952/84 NO Bystolic   Has not been able to change her, took all morning to get her medication in.   Unable to turn her to keep her off her bottom.   Review of Systems:  Review of Systems  Constitutional: Negative for chills and fever.  Respiratory: Negative for cough, sputum production, shortness of breath and wheezing.        Unable to get her to take breo  Gastrointestinal: Positive for abdominal pain and constipation. Negative for blood in stool, diarrhea, heartburn, melena, nausea and vomiting.  Musculoskeletal: Positive for back pain and joint pain. Negative for falls.    Past Medical History:  Diagnosis Date  . Anxiety 07/08/2017  . Arthritis   . Carotid artery disease ([place])    a. s/p L CEA. b. followed by [facility].  . Carotid stenosis 10/19/2012  . Chronic diastolic CHF (congestive heart failure) ([place])   . CKD (chronic kidney disease), stage III ([place])   . Congestive heart disease ([place])   . COPD (chronic obstructive pulmonary disease) ([place])   . COPD GOLD IV 12/30/2014   PFTs 12/15/14 FEV1  0.57 (29%) ratio 54 p 17% resp to saba    .  Cough 01/31/2016  . Dementia with behavioral disturbance ([place]) 07/08/2017  . Diverticulosis   . Dyspnea   . Essential hypertension 12/02/2014  . Fall at home Sept. 2013  . Family history of adverse reaction to anesthesia    SON IS SLOW TO WAKE   . Gastroesophageal reflux disease 07/08/2017  . Hyperlipidemia   . Hypertension   . Hyponatremia 12/02/2014  . Hypotension 02/01/2017  . Incontinent of urine   . Internal hemorrhoid   . Intertrochanteric fracture ([place]) 04/20/2019  . Memory disorder 10/24/2014  . Nausea and vomiting 11/30/2014  . Primary osteoarthritis involving multiple joints 07/08/2017  . Vitamin D deficiency 02/15/2015   Past  Surgical History:  Procedure Laterality Date  . APPENDECTOMY    . BREAST REDUCTION SURGERY    . CAROTID ENDARTERECTOMY     left CEA  . CATARACT EXTRACTION Bilateral   . COLONOSCOPY  2015  . EYE SURGERY     cataracts removed, bilaterally   . INTRAMEDULLARY (IM) NAIL INTERTROCHANTERIC Right 04/21/2019   Procedure: INTRAMEDULLARY (IM) NAIL INTERTROCHANTRIC;  Surgeon: Rod Can, MD;  Location: [facility];  Service: Orthopedics;  Laterality: Right;  . skin cancer removal    . TONSILLECTOMY     Social History:   reports that she quit smoking about 5 weeks ago. Her smoking use included cigarettes. She has a 5.00 pack-year smoking history. She has never used smokeless tobacco. She reports that she does not drink alcohol or use drugs.  Family History  Problem Relation Age of Onset  . Heart disease Father        Before age 8  . Hypertension Father   . Heart attack Father   . Cancer Mother 32       Brain  . Dementia Brother   . Cancer Maternal Aunt 61       breast cancer  . Diabetes Maternal Aunt   . Heart failure Maternal Grandmother   . Diabetes Maternal Grandmother   . Stroke Neg Hx     Medications: Patient's Medications  New Prescriptions   No medications on file  Previous Medications   ALBUTEROL (PROVENTIL) (2.5 MG/3ML) 0.083% NEBULIZER SOLUTION    Take 6 mLs (5 mg total) by nebulization every 4 (four) hours as needed for wheezing or shortness of breath.   ALPRAZOLAM (XANAX) 0.25 MG TABLET    0.5-1 tablet every 6 hours as needed severe anxiety, not to exceed 2 tablet in 24 hours   ARTIFICIAL TEAR OP    Place 2 drops into both eyes daily.    CARBINOXAMINE MALEATE 4 MG TABS    Take 1 tablet (4 mg total) by mouth every 8 (eight) hours as needed.   CHOLECALCIFEROL (D3 SUPER STRENGTH) 2000 UNITS CAPS    Take 1 capsule (2,000 Units total) by mouth daily.   DICLOFENAC SODIUM (VOLTAREN) 1 % GEL    APPLY TO AFFECTED AREA TWICE A DAY IF NEEDED FOR SHOULDER AND KNEE PAIN   DOCUSATE  SODIUM (COLACE) 100 MG CAPSULE    Take 1 capsule (100 mg total) by mouth 2 (two) times daily.   DONEPEZIL (ARICEPT) 10 MG TABLET    TAKE 1 TABLET BY MOUTH EVERY DAY   ENOXAPARIN (LOVENOX) 40 MG/0.4ML INJECTION    Inject 0.4 mLs (40 mg total) into the skin daily.   FLUTICASONE (FLONASE) 50 MCG/ACT NASAL SPRAY    USE 2 SPRAYS IN EACH NOSTRIL DAILY   FLUTICASONE FUROATE-VILANTEROL (BREO ELLIPTA) 100-25 MCG/INH AEPB    Inhale 1  puff into the lungs daily.   FUROSEMIDE (LASIX) 20 MG TABLET    Take 1 tablet (20 mg total) by mouth daily.   GUAIFENESIN (MUCINEX) 600 MG 12 HR TABLET    Take by mouth 2 (two) times daily.   HYDROCODONE-ACETAMINOPHEN (NORCO/VICODIN) 5-325 MG TABLET    Take 1-2 tablets by mouth every 6 (six) hours as needed for moderate pain.   IPRATROPIUM (ATROVENT) 0.06 % NASAL SPRAY    USE 2 SPRAYS IN EACH NOSTRIL TWICE DAILY   IPRATROPIUM-ALBUTEROL (DUONEB) 0.5-2.5 (3) MG/3ML SOLN    inhale contents of 1 vial in nebulizer every 6 hours   MEMANTINE (NAMENDA) 10 MG TABLET    Take 1 tablet (10 mg total) by mouth 2 (two) times daily.   MENTHOL, TOPICAL ANALGESIC, (BIOFREEZE) 4 % GEL    Apply topically as needed.   NEBIVOLOL (BYSTOLIC) 5 MG TABLET    TAKE 0.5 TABLET BY MOUTH EVERY DAY. HOLD A DOSE IF SYSTOLIC BLOOD PRESSURE LESS THAN 105. IF SYSTOLIC BLOOD PRESSURE OVER 150, GIVE 1 WHOLE   ONDANSETRON (ZOFRAN-ODT) 4 MG DISINTEGRATING TABLET    DISSOLVE 1 TABLET ON THE TONGUE IN THE MORNING   PANTOPRAZOLE (PROTONIX) 40 MG TABLET    Take 40 mg by mouth 2 (two) times daily.   SERTRALINE (ZOLOFT) 50 MG TABLET    Take 1.5 tablets (75 mg total) by mouth daily.  Modified Medications   No medications on file  Discontinued Medications   No medications on file    Physical Exam:  There were no vitals filed for this visit. There is no height or weight on file to calculate BMI. Wt Readings from Last 3 Encounters:  04/21/19 178 lb (80.7 kg)  12/14/18 190 lb (86.2 kg)  12/07/18 187 lb 1.9 oz (84.9  kg)   Labs reviewed: Basic Metabolic Panel: Recent Labs    06/21/18 0000 08/30/18 1423  04/22/19 0112 04/25/19 1110 04/26/19 1045  NA 142 141   < > 142 142 144  K 4.1 4.8   < > 4.2 3.6 3.4*  CL 104 99   < > 107 104 105  CO2 24 27   < > 26 27 28   GLUCOSE 97 131*   < > 127* 117* 114*  BUN 17 20   < > 14 18 15   CREATININE 1.16* 1.39*   < > 0.94 0.74 0.76  CALCIUM 8.6* 8.7   < > 8.4* 8.5* 8.8*  TSH 0.603 0.422*  --   --   --   --    < > = values in this interval not displayed.   Liver Function Tests: Recent Labs    06/21/18 0000 08/30/18 1423  AST 20 19  ALT 15 13  ALKPHOS 56 67  BILITOT 1.2 0.8  PROT 5.5* 5.8*  ALBUMIN 3.3* 3.8   No results for input(s): LIPASE, AMYLASE in the last 8760 hours. No results for input(s): AMMONIA in the last 8760 hours. CBC: Recent Labs    04/20/19 0456 04/21/19 0747 04/22/19 0112 04/25/19 1110  WBC 10.7* 10.4 9.9 8.3  NEUTROABS 8.0*  --   --   --   HGB 13.6 12.3 10.0* 9.2*  HCT 43.2 40.0 31.7* 28.9*  MCV 94.3 96.2 94.3 93.2  PLT 234 185 191 230   Lipid Panel: Recent Labs    06/21/18 0000  CHOL 189  HDL 58  LDLCALC 103*  TRIG 140  CHOLHDL 3.3   TSH: Recent Labs    06/21/18 0000  08/30/18 1423  TSH 0.603 0.422*   A1C: Lab Results  Component Value Date   HGBA1C 5.2 12/10/2017     Assessment/Plan 1. Drug-induced constipation -has started miralax today. Continues on colace, without Good BM but has had small ones. Continues miralax daily at this time.  2. Flatulence/gas pain/belching Noted today with significant discomfort but after belching twice doing much better. Can use OTC simethicone PRN if episode reoccurs   3. Essential hypertension pts blood pressure has been controlled (bp consistently staying LESS THAN 120/90  OFF all medications) Will stop bystolic at this time and have family continue to monitor. If blood pressure goes up will     K. Harle Battiest  [facility]  [phone]   Virtual Visit via Video Note  I connected with Virginia [name] on 05/11/19 at 12:00 PM EDT by a video enabled telemedicine application and verified that I am speaking with the correct person using two identifiers.  Location: Patient: home Provider: office    I discussed the limitations of evaluation and management by telemedicine and the availability of in person appointments. The patient expressed understanding and agreed to proceed.    I discussed the assessment and treatment plan with the patient. The patient was provided an opportunity to ask questions and all were answered. The patient agreed with the plan and demonstrated an understanding of the instructions.   The patient was advised to call back or seek an in-person evaluation if the symptoms worsen or if the condition fails to improve as anticipated.  I provided 16 minutes of non-face-to-face time during this encounter.  Carlos American. Dewaine Oats, AGNP Avs printed and mailed.

## 2019-05-12 ENCOUNTER — Other Ambulatory Visit (HOSPITAL_COMMUNITY): Payer: Self-pay | Admitting: Orthopedic Surgery

## 2019-05-12 ENCOUNTER — Telehealth: Payer: Self-pay | Admitting: *Deleted

## 2019-05-12 DIAGNOSIS — M4856XA Collapsed vertebra, not elsewhere classified, lumbar region, initial encounter for fracture: Secondary | ICD-10-CM

## 2019-05-12 NOTE — Telephone Encounter (Signed)
[name], daughter called and stated that the Social Worker came to home yesterday and told them the total opposite of what you did. Stated that she told them not to change, turn, bath or get patient up against her will. Please Advise.

## 2019-05-12 NOTE — Telephone Encounter (Signed)
ideally needs to change positions often (every 2 hours) to avoid bedsores but can not force her against her will. What did they need clarification on?

## 2019-05-12 NOTE — Telephone Encounter (Signed)
Tried calling [name] back and Mailbox is Full and cannot leave message. Will try again later.

## 2019-05-13 ENCOUNTER — Telehealth: Payer: Self-pay | Admitting: *Deleted

## 2019-05-13 ENCOUNTER — Other Ambulatory Visit (HOSPITAL_COMMUNITY): Payer: Self-pay | Admitting: Orthopedic Surgery

## 2019-05-13 DIAGNOSIS — M4856XA Collapsed vertebra, not elsewhere classified, lumbar region, initial encounter for fracture: Secondary | ICD-10-CM

## 2019-05-13 NOTE — Telephone Encounter (Signed)
We are not going to be able to give a strong sedative,but a one time dose of xanax 2 tablets (xanax 0.5 mg) -she is getting 1/2 tablet PRN now- prior to MRI can be given.  It appears the HR is due to her being combative, would let her relax and check it at a later time when she is not agitated as this can increase HR

## 2019-05-13 NOTE — Telephone Encounter (Signed)
Daughter calling stating that pt has a heart rate of 122, she's also being combative and they haven't given her any bystolic today because he blood pressure is good, wanted to know what to do.  Daughter is also asking for a strong sedative for the MRI that pt will have next week, daughter states xanax will not work please advise

## 2019-05-13 NOTE — Telephone Encounter (Signed)
Spoke with daughter and advised results. She will go home and check pt's pulse now

## 2019-05-13 NOTE — Telephone Encounter (Signed)
Tried calling [name] back, no answer

## 2019-05-16 ENCOUNTER — Other Ambulatory Visit: Payer: Self-pay | Admitting: *Deleted

## 2019-05-16 MED ORDER — MEMANTINE HCL 10 MG PO TABS: 10.0000 mg | ORAL_TABLET | Freq: Two times a day (BID) | ORAL | 0 refills | Status: DC

## 2019-05-18 ENCOUNTER — Ambulatory Visit (HOSPITAL_COMMUNITY): Payer: Medicare Other

## 2019-05-20 ENCOUNTER — Encounter: Payer: Self-pay | Admitting: Nurse Practitioner

## 2019-05-20 ENCOUNTER — Ambulatory Visit (INDEPENDENT_AMBULATORY_CARE_PROVIDER_SITE_OTHER): Payer: Medicare Other | Admitting: Nurse Practitioner

## 2019-05-20 ENCOUNTER — Other Ambulatory Visit: Payer: Self-pay

## 2019-05-20 DIAGNOSIS — R140 Abdominal distension (gaseous): Secondary | ICD-10-CM | POA: Diagnosis not present

## 2019-05-20 DIAGNOSIS — K5903 Drug induced constipation: Secondary | ICD-10-CM

## 2019-05-20 DIAGNOSIS — F0391 Unspecified dementia with behavioral disturbance: Secondary | ICD-10-CM | POA: Diagnosis not present

## 2019-05-20 DIAGNOSIS — S72141D Displaced intertrochanteric fracture of right femur, subsequent encounter for closed fracture with routine healing: Secondary | ICD-10-CM

## 2019-05-20 DIAGNOSIS — R0989 Other specified symptoms and signs involving the circulatory and respiratory systems: Secondary | ICD-10-CM | POA: Diagnosis not present

## 2019-05-20 DIAGNOSIS — R42 Dizziness and giddiness: Secondary | ICD-10-CM

## 2019-05-20 DIAGNOSIS — M150 Primary generalized (osteo)arthritis: Secondary | ICD-10-CM | POA: Diagnosis not present

## 2019-05-20 DIAGNOSIS — F419 Anxiety disorder, unspecified: Secondary | ICD-10-CM

## 2019-05-20 DIAGNOSIS — M159 Polyosteoarthritis, unspecified: Secondary | ICD-10-CM

## 2019-05-20 MED ORDER — SERTRALINE HCL 100 MG PO TABS: 100.0000 mg | ORAL_TABLET | Freq: Every day | ORAL | 3 refills | Status: DC

## 2019-05-20 NOTE — Patient Instructions (Signed)
Increase zoloft to 100 mg daily for anxiety.   To use Gas-x as needed for bloating and gas
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
## 2019-05-23 ENCOUNTER — Encounter: Payer: Self-pay | Admitting: Nurse Practitioner

## 2019-05-23 ENCOUNTER — Other Ambulatory Visit: Payer: Self-pay

## 2019-05-23 ENCOUNTER — Ambulatory Visit (INDEPENDENT_AMBULATORY_CARE_PROVIDER_SITE_OTHER): Payer: Medicare Other | Admitting: Nurse Practitioner

## 2019-05-23 DIAGNOSIS — J441 Chronic obstructive pulmonary disease with (acute) exacerbation: Secondary | ICD-10-CM | POA: Diagnosis not present

## 2019-05-23 DIAGNOSIS — F419 Anxiety disorder, unspecified: Secondary | ICD-10-CM

## 2019-05-23 DIAGNOSIS — K5903 Drug induced constipation: Secondary | ICD-10-CM | POA: Diagnosis not present

## 2019-05-23 DIAGNOSIS — E0590 Thyrotoxicosis, unspecified without thyrotoxic crisis or storm: Secondary | ICD-10-CM

## 2019-05-23 MED ORDER — MUCINEX DM 30-600 MG PO TB12: 2.0000 | ORAL_TABLET | Freq: Two times a day (BID) | ORAL | 1 refills | Status: DC

## 2019-05-23 MED ORDER — PREDNISONE 10 MG (21) PO TBPK: ORAL_TABLET | ORAL | 0 refills | Status: DC

## 2019-05-23 MED ORDER — DOCUSATE SODIUM 100 MG PO CAPS: 100.0000 mg | ORAL_CAPSULE | Freq: Two times a day (BID) | ORAL | 5 refills | Status: DC | PRN

## 2019-05-23 MED ORDER — DOXYCYCLINE HYCLATE 100 MG PO TABS: 100.0000 mg | ORAL_TABLET | Freq: Two times a day (BID) | ORAL | 0 refills | Status: DC

## 2019-05-23 MED ORDER — SERTRALINE HCL 50 MG PO TABS: 75.0000 mg | ORAL_TABLET | Freq: Every day | ORAL | Status: DC

## 2019-05-23 NOTE — Patient Instructions (Addendum)
Continue mucinex DM by mouth twice daily with full glass of water (fluid)  Prednisone dose pack as directed  Start doxycyline twice daily Can use probiotic twice daily to help with GI health   Decrease zoloft to 75 mg by mouth daily to see if this helps with tremor and dizziness.

## 2019-05-23 NOTE — Progress Notes (Signed)
This service is provided via telemedicine  No vital signs collected/recorded due to the encounter was a telemedicine visit.   Location of patient (ex: home, work): Home   Patient consents to a telephone visit: Yes  Location of the provider (ex: office, home):  [facility], Office   Name of any referring provider:  N/A  Names of all persons participating in the telemedicine service and their role in the encounter:  S.Chrae B/CMA, Sherrie Mustache, NP, [name] (daughter) and Patient   Time spent on call:  7 min with medical assistant      Careteam: Patient Care Team: Lauree Chandler, NP as PCP - General (Nurse Practitioner) Fay Records, MD as PCP - Cardiology (Cardiology) Tanda Rockers, MD as Consulting Physician (Pulmonary Disease) Conan Bowens, RN as Registered Nurse (Hospice and Palliative Medicine) Hilarie Fredrickson Lajuan Lines, MD as Consulting Physician (Gastroenterology)  Advanced Directive information    Allergies  Allergen Reactions  . Lidocaine Anaphylaxis, Swelling, Rash and Other (See Comments)    [facility] of the [facility] "  . Other Other (See Comments)    All drugs that end in "cane'-  novacane etc. Daughter states patient almost died when she had it when she was born    Chief Complaint  Patient presents with  . Acute Visit    Increased wheezing. Facetime visit   . Medication Management    Discuss zoloft, patient's daughter questions if this medication is causing dizziness.   . Medication Refill    RX request for mucinex 12 hour      HPI: Patient is a 83 y.o. female due to increase in wheezing, dizziness and tremor.   Reports she has called orthopedic due to worsening knee pain and swelling on operative leg.   In the last week she has had increase cough, congestion and  Wheezing. Taking mucinex twice daily for the last 4 days. Now coughing stuff up. Daughter states she is complaining of shortness of breath and hears her wheezing across the  room. Reports it was getting better but now worse.  Worsening congestion in her chest.  No fever.  Generally no coughing when swallowing.   Tremor and dizziness has been worse since increasing zoloft. Continue to have anxiety and bouts of combativeness. Alprazolam helping significantly.    Review of Systems:  Review of Systems  Unable to perform ROS: Dementia    Past Medical History:  Diagnosis Date  . Anxiety 07/08/2017  . Arthritis   . Carotid artery disease ([place])    a. s/p L CEA. b. followed by [facility].  . Carotid stenosis 10/19/2012  . Chronic diastolic CHF (congestive heart failure) ([place])   . CKD (chronic kidney disease), stage III ([place])   . Congestive heart disease ([place])   . COPD (chronic obstructive pulmonary disease) ([place])   . COPD GOLD IV 12/30/2014   PFTs 12/15/14 FEV1  0.57 (29%) ratio 54 p 17% resp to saba    . Cough 01/31/2016  . Dementia with behavioral disturbance ([place]) 07/08/2017  . Diverticulosis   . Dyspnea   . Essential hypertension 12/02/2014  . Fall at home Sept. 2013  . Family history of adverse reaction to anesthesia    SON IS SLOW TO WAKE   . Gastroesophageal reflux disease 07/08/2017  . Hyperlipidemia   . Hypertension   . Hyponatremia 12/02/2014  . Hypotension 02/01/2017  . Incontinent of urine   . Internal hemorrhoid   . Intertrochanteric fracture ([place])  04/20/2019  . Memory disorder 10/24/2014  . Nausea and vomiting 11/30/2014  . Primary osteoarthritis involving multiple joints 07/08/2017  . Vitamin D deficiency 02/15/2015   Past Surgical History:  Procedure Laterality Date  . APPENDECTOMY    . BREAST REDUCTION SURGERY    . CAROTID ENDARTERECTOMY     left CEA  . CATARACT EXTRACTION Bilateral   . COLONOSCOPY  2015  . EYE SURGERY     cataracts removed, bilaterally   . INTRAMEDULLARY (IM) NAIL INTERTROCHANTERIC Right 04/21/2019   Procedure: INTRAMEDULLARY (IM) NAIL INTERTROCHANTRIC;  Surgeon: Rod Can, MD;  Location: [facility];  Service: Orthopedics;   Laterality: Right;  . skin cancer removal    . TONSILLECTOMY     Social History:   reports that she quit smoking about 7 weeks ago. Her smoking use included cigarettes. She has a 5.00 pack-year smoking history. She has never used smokeless tobacco. She reports current alcohol use. She reports that she does not use drugs.  Family History  Problem Relation Age of Onset  . Heart disease Father        Before age 83  . Hypertension Father   . Heart attack Father   . Cancer Mother 5       Brain  . Dementia Brother   . Cancer Maternal Aunt 61       breast cancer  . Diabetes Maternal Aunt   . High blood pressure Brother   . Hypothyroidism Daughter   . High blood pressure Son   . High Cholesterol Son   . Diabetes Son   . COPD Son   . Obesity Son   . Heart failure Maternal Grandmother   . Diabetes Maternal Grandmother   . Stroke Neg Hx     Medications: Patient's Medications  New Prescriptions   No medications on file  Previous Medications   ALBUTEROL (PROVENTIL) (2.5 MG/3ML) 0.083% NEBULIZER SOLUTION    Take 6 mLs (5 mg total) by nebulization every 4 (four) hours as needed for wheezing or shortness of breath.   ALPRAZOLAM (XANAX) 0.25 MG TABLET    0.5-1 tablet every 6 hours as needed severe anxiety, not to exceed 2 tablet in 24 hours   CARBINOXAMINE MALEATE 4 MG TABS    Take 1 tablet (4 mg total) by mouth every 8 (eight) hours as needed.   CHOLECALCIFEROL (D3 SUPER STRENGTH) 2000 UNITS CAPS    Take 1 capsule (2,000 Units total) by mouth daily.   DOCUSATE SODIUM (COLACE) 100 MG CAPSULE    Take 1 capsule (100 mg total) by mouth 2 (two) times daily.   DONEPEZIL (ARICEPT) 10 MG TABLET    TAKE 1 TABLET BY MOUTH EVERY DAY   ENOXAPARIN (LOVENOX) 40 MG/0.4ML INJECTION    Inject 0.4 mLs (40 mg total) into the skin daily.   FUROSEMIDE (LASIX) 20 MG TABLET    Take 1 tablet (20 mg total) by mouth daily.   HYDROCODONE-ACETAMINOPHEN (NORCO/VICODIN) 5-325 MG TABLET    Take 1-2 tablets by mouth  every 6 (six) hours as needed for moderate pain.   IPRATROPIUM-ALBUTEROL (DUONEB) 0.5-2.5 (3) MG/3ML SOLN    inhale contents of 1 vial in nebulizer every 6 hours   MEMANTINE (NAMENDA) 10 MG TABLET    Take 1 tablet (10 mg total) by mouth 2 (two) times daily.   ONDANSETRON (ZOFRAN-ODT) 4 MG DISINTEGRATING TABLET    DISSOLVE 1 TABLET ON THE TONGUE IN THE MORNING   PANTOPRAZOLE (PROTONIX) 40 MG TABLET    Take  40 mg by mouth 2 (two) times daily.   SERTRALINE (ZOLOFT) 100 MG TABLET    Take 1 tablet (100 mg total) by mouth daily.  Modified Medications   No medications on file  Discontinued Medications   No medications on file    Physical Exam:  There were no vitals filed for this visit. There is no height or weight on file to calculate BMI. Wt Readings from Last 3 Encounters:  04/21/19 178 lb (80.7 kg)  12/14/18 190 lb (86.2 kg)  12/07/18 187 lb 1.9 oz (84.9 kg)     Labs reviewed: Basic Metabolic Panel: Recent Labs    06/21/18 0000 08/30/18 1423  04/22/19 0112 04/25/19 1110 04/26/19 1045  NA 142 141   < > 142 142 144  K 4.1 4.8   < > 4.2 3.6 3.4*  CL 104 99   < > 107 104 105  CO2 24 27   < > 26 27 28   GLUCOSE 97 131*   < > 127* 117* 114*  BUN 17 20   < > 14 18 15   CREATININE 1.16* 1.39*   < > 0.94 0.74 0.76  CALCIUM 8.6* 8.7   < > 8.4* 8.5* 8.8*  TSH 0.603 0.422*  --   --   --   --    < > = values in this interval not displayed.   Liver Function Tests: Recent Labs    06/21/18 0000 08/30/18 1423  AST 20 19  ALT 15 13  ALKPHOS 56 67  BILITOT 1.2 0.8  PROT 5.5* 5.8*  ALBUMIN 3.3* 3.8   No results for input(s): LIPASE, AMYLASE in the last 8760 hours. No results for input(s): AMMONIA in the last 8760 hours. CBC: Recent Labs    04/20/19 0456 04/21/19 0747 04/22/19 0112 04/25/19 1110  WBC 10.7* 10.4 9.9 8.3  NEUTROABS 8.0*  --   --   --   HGB 13.6 12.3 10.0* 9.2*  HCT 43.2 40.0 31.7* 28.9*  MCV 94.3 96.2 94.3 93.2  PLT 234 185 191 230   Lipid Panel: Recent  Labs    06/21/18 0000  CHOL 189  HDL 58  LDLCALC 103*  TRIG 140  CHOLHDL 3.3   TSH: Recent Labs    06/21/18 0000 08/30/18 1423  TSH 0.603 0.422*   A1C: Lab Results  Component Value Date   HGBA1C 5.2 12/10/2017     Assessment/Plan 1. COPD exacerbation (HCC) -cough, congestion, shortness of breath and wheezing noted. orginally improved and now has worsened over the last week.  -to continue nebulizer every 6 hours as needed  - Dextromethorphan-guaiFENesin (MUCINEX DM) 30-600 MG TB12; Take 2 tablets by mouth 2 (two) times daily.  Dispense: 28 tablet; Refill: 1 - doxycycline (VIBRA-TABS) 100 MG tablet; Take 1 tablet (100 mg total) by mouth 2 (two) times daily.  Dispense: 14 tablet; Refill: 0 - predniSONE (STERAPRED UNI-PAK 21 TAB) 10 MG (21) TBPK tablet; Use as directed  Dispense: 21 tablet; Refill: 0  2. Anxiety Ongoing, alprazolam significantly helping. Will decrease zoloft back to 75 mg as increase in dizziness and tremors have occurred.  - sertraline (ZOLOFT) 50 MG tablet; Take 1.5 tablets (75 mg total) by mouth daily.  3. Drug-induced constipation -stools continue to be loose, to hold colace at this time and change to PRN - docusate sodium (COLACE) 100 MG capsule; Take 1 capsule (100 mg total) by mouth 2 (two) times daily as needed for mild constipation.  Dispense: 60 capsule; Refill: 5  4. Subclinical hyperthyroidism -noted on previous blood work, will have home health obtain TSH, free t3 and free t4 at this time due to worsening tremor, headache, and dizziness.   Next appt: 05/30/2019 Carlos American. Harle Battiest  [facility] [phone]   Virtual Visit via Video Note FACETIME  I connected with Virginia [name] on 05/23/19 at  3:45 PM EDT by a video enabled telemedicine application and verified that I am speaking with the correct person using two identifiers.  Location: Patient: home Provider: office   I discussed the limitations of  evaluation and management by telemedicine and the availability of in person appointments. The patient expressed understanding and agreed to proceed.    I discussed the assessment and treatment plan with the patient. The patient was provided an opportunity to ask questions and all were answered. The patient agreed with the plan and demonstrated an understanding of the instructions.   The patient was advised to call back or seek an in-person evaluation if the symptoms worsen or if the condition fails to improve as anticipated.  I provided 25 minutes of non-face-to-face time during this encounter.  Carlos American. Dewaine Oats, AGNP Avs printed and mailed.

## 2019-05-25 ENCOUNTER — Ambulatory Visit (HOSPITAL_COMMUNITY)
Admission: RE | Admit: 2019-05-25 | Discharge: 2019-05-25 | Disposition: A | Discharge status: Home / Self Care | Payer: Medicare Other | Source: Ambulatory Visit | Attending: Orthopedic Surgery | Admitting: Orthopedic Surgery

## 2019-05-25 ENCOUNTER — Emergency Department (HOSPITAL_COMMUNITY)
Admission: EM | Admit: 2019-05-25 | Discharge: 2019-05-25 | Disposition: A | Discharge status: Home / Self Care | Payer: Medicare Other | Attending: Emergency Medicine | Admitting: Emergency Medicine

## 2019-05-25 ENCOUNTER — Other Ambulatory Visit: Payer: Self-pay

## 2019-05-25 DIAGNOSIS — M4856XA Collapsed vertebra, not elsewhere classified, lumbar region, initial encounter for fracture: Secondary | ICD-10-CM | POA: Insufficient documentation

## 2019-05-25 DIAGNOSIS — H1131 Conjunctival hemorrhage, right eye: Secondary | ICD-10-CM | POA: Diagnosis not present

## 2019-05-25 DIAGNOSIS — I5033 Acute on chronic diastolic (congestive) heart failure: Secondary | ICD-10-CM | POA: Diagnosis not present

## 2019-05-25 DIAGNOSIS — F0390 Unspecified dementia without behavioral disturbance: Secondary | ICD-10-CM | POA: Insufficient documentation

## 2019-05-25 DIAGNOSIS — I2510 Atherosclerotic heart disease of native coronary artery without angina pectoris: Secondary | ICD-10-CM | POA: Diagnosis not present

## 2019-05-25 DIAGNOSIS — J209 Acute bronchitis, unspecified: Secondary | ICD-10-CM | POA: Diagnosis not present

## 2019-05-25 DIAGNOSIS — Z87891 Personal history of nicotine dependence: Secondary | ICD-10-CM | POA: Diagnosis not present

## 2019-05-25 DIAGNOSIS — N183 Chronic kidney disease, stage 3 (moderate): Secondary | ICD-10-CM | POA: Insufficient documentation

## 2019-05-25 DIAGNOSIS — Z743 Need for continuous supervision: Secondary | ICD-10-CM | POA: Diagnosis not present

## 2019-05-25 DIAGNOSIS — M5489 Other dorsalgia: Secondary | ICD-10-CM | POA: Diagnosis not present

## 2019-05-25 DIAGNOSIS — J441 Chronic obstructive pulmonary disease with (acute) exacerbation: Secondary | ICD-10-CM | POA: Diagnosis not present

## 2019-05-25 DIAGNOSIS — H5789 Other specified disorders of eye and adnexa: Secondary | ICD-10-CM | POA: Diagnosis present

## 2019-05-25 DIAGNOSIS — I130 Hypertensive heart and chronic kidney disease with heart failure and stage 1 through stage 4 chronic kidney disease, or unspecified chronic kidney disease: Secondary | ICD-10-CM | POA: Insufficient documentation

## 2019-05-25 DIAGNOSIS — Z79899 Other long term (current) drug therapy: Secondary | ICD-10-CM | POA: Insufficient documentation

## 2019-05-25 DIAGNOSIS — Z7401 Bed confinement status: Secondary | ICD-10-CM | POA: Diagnosis not present

## 2019-05-25 DIAGNOSIS — R52 Pain, unspecified: Secondary | ICD-10-CM | POA: Diagnosis not present

## 2019-05-25 DIAGNOSIS — S32030A Wedge compression fracture of third lumbar vertebra, initial encounter for closed fracture: Secondary | ICD-10-CM | POA: Diagnosis not present

## 2019-05-25 DIAGNOSIS — I5032 Chronic diastolic (congestive) heart failure: Secondary | ICD-10-CM | POA: Insufficient documentation

## 2019-05-25 DIAGNOSIS — M150 Primary generalized (osteo)arthritis: Secondary | ICD-10-CM | POA: Diagnosis not present

## 2019-05-25 DIAGNOSIS — E0590 Thyrotoxicosis, unspecified without thyrotoxic crisis or storm: Secondary | ICD-10-CM | POA: Diagnosis not present

## 2019-05-25 DIAGNOSIS — R279 Unspecified lack of coordination: Secondary | ICD-10-CM | POA: Diagnosis not present

## 2019-05-25 DIAGNOSIS — R4182 Altered mental status, unspecified: Secondary | ICD-10-CM | POA: Diagnosis not present

## 2019-05-25 DIAGNOSIS — S72141D Displaced intertrochanteric fracture of right femur, subsequent encounter for closed fracture with routine healing: Secondary | ICD-10-CM | POA: Diagnosis not present

## 2019-05-25 DIAGNOSIS — J440 Chronic obstructive pulmonary disease with acute lower respiratory infection: Secondary | ICD-10-CM | POA: Diagnosis not present

## 2019-05-25 DIAGNOSIS — M2550 Pain in unspecified joint: Secondary | ICD-10-CM | POA: Diagnosis not present

## 2019-05-25 LAB — BASIC METABOLIC PANEL
BUN: 12 (ref 4–21)
Creatinine: 0.8 (ref 0.5–1.1)
Glucose: 104
Potassium: 4.1 (ref 3.4–5.3)
Sodium: 141 (ref 137–147)

## 2019-05-25 LAB — CBC AND DIFFERENTIAL
HCT: 38 (ref 36–46)
Hemoglobin: 12 (ref 12.0–16.0)
Neutrophils Absolute: 6
Platelets: 283 (ref 150–399)
WBC: 8.2

## 2019-05-25 LAB — TSH: TSH: 0.35 — AB (ref 0.41–5.90)

## 2019-05-25 MED ORDER — TETRACAINE HCL 0.5 % OP SOLN: 2.0000 [drp] | Freq: Once | OPHTHALMIC | Status: DC

## 2019-05-25 MED ORDER — FLUORESCEIN SODIUM 1 MG OP STRP
1.0000 | ORAL_STRIP | Freq: Once | OPHTHALMIC | Status: AC
  Administered 2019-05-25: 1 via OPHTHALMIC
  Filled 2019-05-25: qty 1

## 2019-05-25 NOTE — Discharge Instructions (Signed)
You have bleeding in the white part of the eye.  This happens from time to time if you may have accidentally bumped your eye.  This gets better on its own without any medical intervention.  Please follow-up with your family doctor.  Please return to the emergency department for change in vision or pain to the eye.  On my exam here with floor seen and there was no concern for an injury to the outer layer of the eye.

## 2019-05-25 NOTE — ED Notes (Signed)
PT[facility] called and will return when able.  Pt restful at this time.

## 2019-05-25 NOTE — ED Provider Notes (Signed)
[facility] EMERGENCY DEPARTMENT Provider Note   CSN: [id number] Arrival date & time: 05/25/19  1159    History   Chief Complaint Chief Complaint  Patient presents with  . EYE Irritation    HPI BERNARDETTE [name] is a 83 y.o. female.     83 yo F with a chief complaints of redness to the white part of her right eye.  This was noted today while she was getting an MRI done of her hip after having a hip fracture.  Patient has no complaints denies any pain to the eye denies any change in vision.  Denies feeling like something stuck in there.  Denies contact lens use.  The history is provided by the patient.  Illness Severity:  Mild Onset quality:  Sudden Duration:  1 day Timing:  Constant Progression:  Unchanged Chronicity:  New   Past Medical History:  Diagnosis Date  . Anxiety 07/08/2017  . Arthritis   . Carotid artery disease ([place])    a. s/p L CEA. b. followed by [facility].  . Carotid stenosis 10/19/2012  . Chronic diastolic CHF (congestive heart failure) ([place])   . CKD (chronic kidney disease), stage III ([place])   . Congestive heart disease ([place])   . COPD (chronic obstructive pulmonary disease) ([place])   . COPD GOLD IV 12/30/2014   PFTs 12/15/14 FEV1  0.57 (29%) ratio 54 p 17% resp to saba    . Cough 01/31/2016  . Dementia with behavioral disturbance ([place]) 07/08/2017  . Diverticulosis   . Dyspnea   . Essential hypertension 12/02/2014  . Fall at home Sept. 2013  . Family history of adverse reaction to anesthesia    SON IS SLOW TO WAKE   . Gastroesophageal reflux disease 07/08/2017  . Hyperlipidemia   . Hypertension   . Hyponatremia 12/02/2014  . Hypotension 02/01/2017  . Incontinent of urine   . Internal hemorrhoid   . Intertrochanteric fracture ([place]) 04/20/2019  . Memory disorder 10/24/2014  . Nausea and vomiting 11/30/2014  . Primary osteoarthritis involving multiple joints 07/08/2017  . Vitamin D deficiency 02/15/2015    Patient Active Problem List   Diagnosis Date  Noted  . Displaced intertrochanteric fracture of right femur ([place]) 04/21/2019  . Closed intertrochanteric fracture of hip, right, initial encounter ([place]) 04/20/2019  . Leukocytosis 04/20/2019  . Fall at home, initial encounter 04/20/2019  . Primary osteoarthritis involving multiple joints 07/08/2017  . Dementia with behavioral disturbance ([place]) 07/08/2017  . Anxiety 07/08/2017  . Gastroesophageal reflux disease 07/08/2017  . Hypotension 02/01/2017  . CKD (chronic kidney disease), stage III ([place]) 02/01/2017  . Cough 01/31/2016  . Vitamin D deficiency 02/15/2015  . Hyperlipidemia 01/18/2015  . COPD GOLD IV 12/30/2014  . Chronic diastolic CHF (congestive heart failure) ([place]) 12/08/2014  . Dyspnea   . Hyponatremia 12/02/2014  . Essential hypertension 12/02/2014  . Nausea and vomiting 11/30/2014  . Memory disorder 10/24/2014  . Carotid artery disease ([place]) 10/19/2012    Past Surgical History:  Procedure Laterality Date  . APPENDECTOMY    . BREAST REDUCTION SURGERY    . CAROTID ENDARTERECTOMY     left CEA  . CATARACT EXTRACTION Bilateral   . COLONOSCOPY  2015  . EYE SURGERY     cataracts removed, bilaterally   . INTRAMEDULLARY (IM) NAIL INTERTROCHANTERIC Right 04/21/2019   Procedure: INTRAMEDULLARY (IM) NAIL INTERTROCHANTRIC;  Surgeon: Rod Can, MD;  Location: [facility];  Service: Orthopedics;  Laterality: Right;  . skin cancer removal    .  TONSILLECTOMY       OB History   No obstetric history on file.      Home Medications    Prior to Admission medications   Medication Sig Start Date End Date Taking? Authorizing Provider  albuterol (PROVENTIL) (2.5 MG/3ML) 0.083% nebulizer solution Take 6 mLs (5 mg total) by nebulization every 4 (four) hours as needed for wheezing or shortness of breath. 02/03/17   Geradine Girt, DO  ALPRAZolam Duanne Moron) 0.25 MG tablet 0.5-1 tablet every 6 hours as needed severe anxiety, not to exceed 2 tablet in 24 hours 05/02/19   Lauree Chandler, NP   Carbinoxamine Maleate 4 MG TABS Take 1 tablet (4 mg total) by mouth every 8 (eight) hours as needed. 11/16/18   Valentina Shaggy, MD  Cholecalciferol (D3 SUPER STRENGTH) 2000 units CAPS Take 1 capsule (2,000 Units total) by mouth daily. 07/01/18   Reed, Tiffany L, DO  Dextromethorphan-guaiFENesin (MUCINEX DM) 30-600 MG TB12 Take 2 tablets by mouth 2 (two) times daily. 05/23/19   Lauree Chandler, NP  docusate sodium (COLACE) 100 MG capsule Take 1 capsule (100 mg total) by mouth 2 (two) times daily as needed for mild constipation. 05/23/19   Lauree Chandler, NP  donepezil (ARICEPT) 10 MG tablet TAKE 1 TABLET BY MOUTH EVERY DAY 04/05/19   Suzzanne Cloud, NP  doxycycline (VIBRA-TABS) 100 MG tablet Take 1 tablet (100 mg total) by mouth 2 (two) times daily. 05/23/19   Lauree Chandler, NP  enoxaparin (LOVENOX) 40 MG/0.4ML injection Inject 0.4 mLs (40 mg total) into the skin daily. 04/27/19   Swinteck, Aaron Edelman, MD  furosemide (LASIX) 20 MG tablet Take 1 tablet (20 mg total) by mouth daily. 12/07/18   Imogene Burn, PA-C  HYDROcodone-acetaminophen (NORCO/VICODIN) 5-325 MG tablet Take 1-2 tablets by mouth every 6 (six) hours as needed for moderate pain. 04/26/19   Swinteck, Aaron Edelman, MD  ipratropium-albuterol (DUONEB) 0.5-2.5 (3) MG/3ML SOLN inhale contents of 1 vial in nebulizer every 6 hours 04/28/19   Lauree Chandler, NP  memantine (NAMENDA) 10 MG tablet Take 1 tablet (10 mg total) by mouth 2 (two) times daily. 05/16/19   Suzzanne Cloud, NP  ondansetron (ZOFRAN-ODT) 4 MG disintegrating tablet DISSOLVE 1 TABLET ON THE TONGUE IN THE MORNING 11/30/18   Pyrtle, Lajuan Lines, MD  pantoprazole (PROTONIX) 40 MG tablet Take 40 mg by mouth 2 (two) times daily.    [provider]  predniSONE (STERAPRED UNI-PAK 21 TAB) 10 MG (21) TBPK tablet Use as directed 05/23/19   Lauree Chandler, NP  sertraline (ZOLOFT) 50 MG tablet Take 1.5 tablets (75 mg total) by mouth daily. 05/23/19   Lauree Chandler, NP    Family  History Family History  Problem Relation Age of Onset  . Heart disease Father        Before age 96  . Hypertension Father   . Heart attack Father   . Cancer Mother 25       Brain  . Dementia Brother   . Cancer Maternal Aunt 61       breast cancer  . Diabetes Maternal Aunt   . High blood pressure Brother   . Hypothyroidism Daughter   . High blood pressure Son   . High Cholesterol Son   . Diabetes Son   . COPD Son   . Obesity Son   . Heart failure Maternal Grandmother   . Diabetes Maternal Grandmother   . Stroke Neg Hx  Social History Social History   Tobacco Use  . Smoking status: Former Smoker    Packs/day: 0.25    Years: 20.00    Pack years: 5.00    Types: Cigarettes    Quit date: 04/2019    Years since quitting: 0.1  . Smokeless tobacco: Never Used  . Tobacco comment: 4 or 5 cigarettes per day average  Substance Use Topics  . Alcohol use: Yes    Alcohol/week: 0.0 standard drinks    Comment: Rare glass of wine  . Drug use: No     Allergies   Lidocaine and Other   Review of Systems Review of Systems  Eyes: Positive for redness. Negative for photophobia, pain, discharge, itching and visual disturbance.  All other systems reviewed and are negative.    Physical Exam Updated Vital Signs BP 127/66 (BP Location: Right Arm)   Pulse 100   Temp 98.8 F (37.1 C) (Oral)   Resp 18   LMP  (LMP Unknown)   SpO2 100%   Physical Exam Vitals signs and nursing note reviewed.  Constitutional:      General: She is not in acute distress.    Appearance: She is well-developed. She is not diaphoretic.  HENT:     Head: Normocephalic and atraumatic.     Comments: Small amount of erythema to the medial aspect of the sclera of the right eye.  Fluorescein exam without any uptake.  No foreign bodies. Eyes:     Pupils: Pupils are equal, round, and reactive to light.  Neck:     Musculoskeletal: Normal range of motion and neck supple.  Cardiovascular:     Rate and  Rhythm: Normal rate and regular rhythm.     Heart sounds: No murmur. No friction rub. No gallop.   Pulmonary:     Effort: Pulmonary effort is normal.     Breath sounds: No wheezing or rales.  Abdominal:     General: There is no distension.     Palpations: Abdomen is soft.     Tenderness: There is no abdominal tenderness.  Musculoskeletal:        General: No tenderness.  Skin:    General: Skin is warm and dry.  Neurological:     Mental Status: She is alert and oriented to person, place, and time.  Psychiatric:        Behavior: Behavior normal.      ED Treatments / Results  Labs (all labs ordered are listed, but only abnormal results are displayed) Labs Reviewed - No data to display  EKG None  Radiology No results found.  Procedures Procedures (including critical care time)  Medications Ordered in ED Medications  fluorescein ophthalmic strip 1 strip (has no administration in time range)  tetracaine (PONTOCAINE) 0.5 % ophthalmic solution 2 drop (has no administration in time range)     Initial Impression / Assessment and Plan / ED Course  I have reviewed the triage vital signs and the nursing notes.  Pertinent labs & imaging results that were available during my care of the patient were reviewed by me and considered in my medical decision making (see chart for details).        83 yo F with incidental erythema to her sclera that was noted when she had an MRI performed today.  Patient has a subconjunctival hemorrhage clinically.  Very mild.  No visual complaints.  Fluorescein exam without uptake.  PCP follow-up.  12:35 PM:  I have discussed the diagnosis/risks/treatment options with  the patient and believe the pt to be eligible for discharge home to follow-up with PCP. We also discussed returning to the ED immediately if new or worsening sx occur. We discussed the sx which are most concerning (e.g., sudden worsening pain, vision change) that necessitate immediate  return. Medications administered to the patient during their visit and any new prescriptions provided to the patient are listed below.  Medications given during this visit Medications  fluorescein ophthalmic strip 1 strip (has no administration in time range)  tetracaine (PONTOCAINE) 0.5 % ophthalmic solution 2 drop (has no administration in time range)     The patient appears reasonably screen and/or stabilized for discharge and I doubt any other medical condition or other [facility] requiring further screening, evaluation, or treatment in the ED at this time prior to discharge.    Final Clinical Impressions(s) / ED Diagnoses   Final diagnoses:  Subconjunctival hemorrhage of right eye    ED Discharge Orders    None       Deno Etienne, DO 05/25/19 1235

## 2019-05-25 NOTE — ED Triage Notes (Signed)
PTAR- It was noted by the daughter (at bedside) that the Inside corner of the right eye is red and irritated. She was over in MRI for scan of hip fx. Pt has hx of alzheimer's. Takes Lovenox and ASA.

## 2019-05-25 NOTE — ED Notes (Signed)
MD exam completed and no treatment needed.  Daughter called and will transport via [place].  [phone]

## 2019-05-26 ENCOUNTER — Telehealth: Payer: Self-pay

## 2019-05-26 ENCOUNTER — Encounter: Payer: Self-pay | Admitting: Nurse Practitioner

## 2019-05-26 NOTE — Telephone Encounter (Signed)
This would be a question for the orthopedic, recommend her calling their office in regards to this.

## 2019-05-26 NOTE — Telephone Encounter (Signed)
Called patients daughter back and told she should contact the ortho office per Provider

## 2019-05-26 NOTE — Telephone Encounter (Signed)
Patients daughter Dorthory called to find out if it would be alright to stop giving patient her mother the Lovenox medication she was given to take for 30 days she says it has been 28 or 29 days and she has not given a dose today   And would like to know if this is alright to do and start her mother on the 40 mg Asprin                            Please advise

## 2019-05-27 ENCOUNTER — Other Ambulatory Visit: Payer: Self-pay

## 2019-05-27 ENCOUNTER — Ambulatory Visit (INDEPENDENT_AMBULATORY_CARE_PROVIDER_SITE_OTHER): Payer: Medicare Other | Admitting: Nurse Practitioner

## 2019-05-27 ENCOUNTER — Encounter: Payer: Self-pay | Admitting: Nurse Practitioner

## 2019-05-27 DIAGNOSIS — I10 Essential (primary) hypertension: Secondary | ICD-10-CM | POA: Diagnosis not present

## 2019-05-27 DIAGNOSIS — S72141D Displaced intertrochanteric fracture of right femur, subsequent encounter for closed fracture with routine healing: Secondary | ICD-10-CM

## 2019-05-27 DIAGNOSIS — R42 Dizziness and giddiness: Secondary | ICD-10-CM

## 2019-05-27 DIAGNOSIS — N183 Chronic kidney disease, stage 3 unspecified: Secondary | ICD-10-CM

## 2019-05-27 DIAGNOSIS — J441 Chronic obstructive pulmonary disease with (acute) exacerbation: Secondary | ICD-10-CM | POA: Diagnosis not present

## 2019-05-27 DIAGNOSIS — E0590 Thyrotoxicosis, unspecified without thyrotoxic crisis or storm: Secondary | ICD-10-CM

## 2019-05-27 NOTE — Progress Notes (Signed)
This service is provided via telemedicine  No vital signs collected/recorded due to the encounter was a telemedicine visit.   Location of patient (ex: home, work):  Home  Patient consents to a telephone visit:  Yes  Location of the provider (ex: office, home):  [facility], Office   Name of any referring provider:  N/A  Names of all persons participating in the telemedicine service and their role in the encounter: S.Chrae B/CMA, Sherrie Mustache, NP, [name] (daughter), Patients brother [name]) and Patient   Time spent on call: 4 min with medical assistant      Careteam: Patient Care Team: Lauree Chandler, NP as PCP - General (Nurse Practitioner) Fay Records, MD as PCP - Cardiology (Cardiology) Tanda Rockers, MD as Consulting Physician (Pulmonary Disease) Conan Bowens, RN as Registered Nurse (Hospice and Palliative Medicine) Hilarie Fredrickson Lajuan Lines, MD as Consulting Physician (Gastroenterology)  Advanced Directive information    Allergies  Allergen Reactions  . Lidocaine Anaphylaxis, Swelling, Rash and Other (See Comments)    Any of the " [facility] "  . Other Other (See Comments)    All drugs that end in "cane'-  novacane etc. Daughter states patient almost died when she had it when she was born    Chief Complaint  Patient presents with  . Follow-up    Discuss recent labs. Elevated B/P- 170/103, fuzzy head and overall does not feel right. Telephone/Facetime visit      HPI: Patient is a 83 y.o. female   Had MRI of back 2 days ago. Gave alprazolam  2 tablets = 0.5 mg total and "knocked her out" since No alprazalam, no tylenol, no hydrodone-apap.  Went to the ED due to red eye, has improved now.  blood pressure this morning  170/100, HR 103 175/102  HR 106  170/101  HR 102  Has been holding bystolic 5 mg due to low blood pressure  zoloft was also decrease to 75 mg.  Tremors and dizziness has improved since off pain medication and getting  alprazolam 0.5 mg   Has not complained of any pain since on prednisone for COPD exacerbation Plan to start back with PT next week.  Appetite is up.  Breathing is much better, congestion and cough improved.     Review of Systems:  Review of Systems  Unable to perform ROS: Dementia    Past Medical History:  Diagnosis Date  . Anxiety 07/08/2017  . Arthritis   . Carotid artery disease ([place])    a. s/p L CEA. b. followed by [facility].  . Carotid stenosis 10/19/2012  . Chronic diastolic CHF (congestive heart failure) ([place])   . CKD (chronic kidney disease), stage III ([place])   . Congestive heart disease ([place])   . COPD (chronic obstructive pulmonary disease) ([place])   . COPD GOLD IV 12/30/2014   PFTs 12/15/14 FEV1  0.57 (29%) ratio 54 p 17% resp to saba    . Cough 01/31/2016  . Dementia with behavioral disturbance ([place]) 07/08/2017  . Diverticulosis   . Dyspnea   . Essential hypertension 12/02/2014  . Fall at home Sept. 2013  . Family history of adverse reaction to anesthesia    SON IS SLOW TO WAKE   . Gastroesophageal reflux disease 07/08/2017  . Hyperlipidemia   . Hypertension   . Hyponatremia 12/02/2014  . Hypotension 02/01/2017  . Incontinent of urine   . Internal hemorrhoid   . Intertrochanteric fracture ([place]) 04/20/2019  . Memory  disorder 10/24/2014  . Nausea and vomiting 11/30/2014  . Primary osteoarthritis involving multiple joints 07/08/2017  . Vitamin D deficiency 02/15/2015   Past Surgical History:  Procedure Laterality Date  . APPENDECTOMY    . BREAST REDUCTION SURGERY    . CAROTID ENDARTERECTOMY     left CEA  . CATARACT EXTRACTION Bilateral   . COLONOSCOPY  2015  . EYE SURGERY     cataracts removed, bilaterally   . INTRAMEDULLARY (IM) NAIL INTERTROCHANTERIC Right 04/21/2019   Procedure: INTRAMEDULLARY (IM) NAIL INTERTROCHANTRIC;  Surgeon: Rod Can, MD;  Location: [facility];  Service: Orthopedics;  Laterality: Right;  . skin cancer removal    . TONSILLECTOMY     Social History:    reports that she quit smoking about 8 weeks ago. Her smoking use included cigarettes. She has a 5.00 pack-year smoking history. She has never used smokeless tobacco. She reports current alcohol use. She reports that she does not use drugs.  Family History  Problem Relation Age of Onset  . Heart disease Father        Before age 67  . Hypertension Father   . Heart attack Father   . Cancer Mother 13       Brain  . Dementia Brother   . Cancer Maternal Aunt 61       breast cancer  . Diabetes Maternal Aunt   . High blood pressure Brother   . Hypothyroidism Daughter   . High blood pressure Son   . High Cholesterol Son   . Diabetes Son   . COPD Son   . Obesity Son   . Heart failure Maternal Grandmother   . Diabetes Maternal Grandmother   . Stroke Neg Hx     Medications: Patient's Medications  New Prescriptions   No medications on file  Previous Medications   ALBUTEROL (PROVENTIL) (2.5 MG/3ML) 0.083% NEBULIZER SOLUTION    Take 6 mLs (5 mg total) by nebulization every 4 (four) hours as needed for wheezing or shortness of breath.   ALPRAZOLAM (XANAX) 0.25 MG TABLET    0.5-1 tablet every 6 hours as needed severe anxiety, not to exceed 2 tablet in 24 hours   CARBINOXAMINE MALEATE 4 MG TABS    Take 1 tablet (4 mg total) by mouth every 8 (eight) hours as needed.   CHOLECALCIFEROL (D3 SUPER STRENGTH) 2000 UNITS CAPS    Take 1 capsule (2,000 Units total) by mouth daily.   DEXTROMETHORPHAN-GUAIFENESIN (MUCINEX DM) 30-600 MG TB12    Take 2 tablets by mouth 2 (two) times daily.   DOCUSATE SODIUM (COLACE) 100 MG CAPSULE    Take 1 capsule (100 mg total) by mouth 2 (two) times daily as needed for mild constipation.   DONEPEZIL (ARICEPT) 10 MG TABLET    TAKE 1 TABLET BY MOUTH EVERY DAY   DOXYCYCLINE (VIBRA-TABS) 100 MG TABLET    Take 1 tablet (100 mg total) by mouth 2 (two) times daily.   ENOXAPARIN (LOVENOX) 40 MG/0.4ML INJECTION    Inject 0.4 mLs (40 mg total) into the skin daily.   FUROSEMIDE  (LASIX) 20 MG TABLET    Take 1 tablet (20 mg total) by mouth daily.   HYDROCODONE-ACETAMINOPHEN (NORCO/VICODIN) 5-325 MG TABLET    Take 1-2 tablets by mouth every 6 (six) hours as needed for moderate pain.   IPRATROPIUM-ALBUTEROL (DUONEB) 0.5-2.5 (3) MG/3ML SOLN    inhale contents of 1 vial in nebulizer every 6 hours   MEMANTINE (NAMENDA) 10 MG TABLET  Take 1 tablet (10 mg total) by mouth 2 (two) times daily.   ONDANSETRON (ZOFRAN-ODT) 4 MG DISINTEGRATING TABLET    DISSOLVE 1 TABLET ON THE TONGUE IN THE MORNING   PANTOPRAZOLE (PROTONIX) 40 MG TABLET    Take 40 mg by mouth 2 (two) times daily.   PREDNISONE (STERAPRED UNI-PAK 21 TAB) 10 MG (21) TBPK TABLET    Use as directed   SERTRALINE (ZOLOFT) 50 MG TABLET    Take 1.5 tablets (75 mg total) by mouth daily.  Modified Medications   No medications on file  Discontinued Medications   No medications on file    Physical Exam:  There were no vitals filed for this visit. There is no height or weight on file to calculate BMI. Wt Readings from Last 3 Encounters:  04/21/19 178 lb (80.7 kg)  12/14/18 190 lb (86.2 kg)  12/07/18 187 lb 1.9 oz (84.9 kg)      Labs reviewed: Basic Metabolic Panel: Recent Labs    06/21/18 0000 08/30/18 1423  04/22/19 0112 04/25/19 1110 04/26/19 1045 05/25/19  NA 142 141   < > 142 142 144 141  K 4.1 4.8   < > 4.2 3.6 3.4* 4.1  CL 104 99   < > 107 104 105  --   CO2 24 27   < > 26 27 28   --   GLUCOSE 97 131*   < > 127* 117* 114*  --   BUN 17 20   < > 14 18 15 12   CREATININE 1.16* 1.39*   < > 0.94 0.74 0.76 0.8  CALCIUM 8.6* 8.7   < > 8.4* 8.5* 8.8*  --   TSH 0.603 0.422*  --   --   --   --  0.35*   < > = values in this interval not displayed.   Liver Function Tests: Recent Labs    06/21/18 0000 08/30/18 1423  AST 20 19  ALT 15 13  ALKPHOS 56 67  BILITOT 1.2 0.8  PROT 5.5* 5.8*  ALBUMIN 3.3* 3.8   No results for input(s): LIPASE, AMYLASE in the last 8760 hours. No results for input(s): AMMONIA  in the last 8760 hours. CBC: Recent Labs    04/20/19 0456 04/21/19 0747 04/22/19 0112 04/25/19 1110 05/25/19  WBC 10.7* 10.4 9.9 8.3 8.2  NEUTROABS 8.0*  --   --   --  6  HGB 13.6 12.3 10.0* 9.2* 12.0  HCT 43.2 40.0 31.7* 28.9* 38  MCV 94.3 96.2 94.3 93.2  --   PLT 234 185 191 230 283   Lipid Panel: Recent Labs    06/21/18 0000  CHOL 189  HDL 58  LDLCALC 103*  TRIG 140  CHOLHDL 3.3   TSH: Recent Labs    06/21/18 0000 08/30/18 1423 05/25/19  TSH 0.603 0.422* 0.35*   A1C: Lab Results  Component Value Date   HGBA1C 5.2 12/10/2017     Assessment/Plan 1. Subclinical hyperthyroidism TSH 0.345 on last lab, free T4 normal at 1.10. will restart bystolic due to elevated HR.   2. CKD (chronic kidney disease), stage III (HCC) -stable. Encourage proper hydration and to avoid NSAIDS (Aleve, Advil, Motrin, Ibuprofen).   3. Dizziness Has improved at this time.   4. Essential hypertension Elevated today, will restart bystolic 5 mg by mouth daily, will give additional dose of bystolic 5 mg by mouth daily as needed for sbp greater than 160 (HR <90).   5. Closed displaced intertrochanteric fracture of  right femur with routine healing, subsequent encounter No pain at this time, encouraged to use tylenol as needed for pain medication.   6. COPD exacerbation ([place]) Significantly improved. Continues on doxycyline until complete with mucinex by mouth twice daily and neb.   Next appt: will keep follow up on 05/30/2019 Carlos American. , [facility] [phone]    Virtual Visit via Telephone Note  I connected with pt and her daughter on 05/27/19 at  3:45 PM EDT by telephone and verified that I am speaking with the correct person using two identifiers.  Location: Patient: home Provider: office   I discussed the limitations, risks, security and privacy concerns of performing an evaluation and management service by telephone and the  availability of in person appointments. I also discussed with the patient that there may be a patient responsible charge related to this service. The patient expressed understanding and agreed to proceed.   I discussed the assessment and treatment plan with the patient. The patient was provided an opportunity to ask questions and all were answered. The patient agreed with the plan and demonstrated an understanding of the instructions.   The patient was advised to call back or seek an in-person evaluation if the symptoms worsen or if the condition fails to improve as anticipated.  I provided 25 minutes of non-face-to-face time during this encounter.  Carlos American. Dewaine Oats, AGNP Avs printed and mailed  ,

## 2019-05-27 NOTE — Patient Instructions (Signed)
To restart bystolic 5 mg daily for HR and BP May give additional 5 mg daily as needed for blood pressure over 160.   To give tylenol first for pain- only use hydrocodone for severe pain unrelieved by tylenol.

## 2019-05-30 ENCOUNTER — Other Ambulatory Visit: Payer: Self-pay

## 2019-05-30 ENCOUNTER — Encounter: Payer: Self-pay | Admitting: Nurse Practitioner

## 2019-05-30 ENCOUNTER — Other Ambulatory Visit: Payer: Self-pay | Admitting: Nurse Practitioner

## 2019-05-30 ENCOUNTER — Ambulatory Visit (INDEPENDENT_AMBULATORY_CARE_PROVIDER_SITE_OTHER): Payer: Medicare Other | Admitting: Nurse Practitioner

## 2019-05-30 ENCOUNTER — Ambulatory Visit: Payer: Self-pay

## 2019-05-30 DIAGNOSIS — F419 Anxiety disorder, unspecified: Secondary | ICD-10-CM | POA: Diagnosis not present

## 2019-05-30 DIAGNOSIS — R251 Tremor, unspecified: Secondary | ICD-10-CM

## 2019-05-30 DIAGNOSIS — I10 Essential (primary) hypertension: Secondary | ICD-10-CM

## 2019-05-30 DIAGNOSIS — M545 Low back pain, unspecified: Secondary | ICD-10-CM

## 2019-05-30 DIAGNOSIS — K219 Gastro-esophageal reflux disease without esophagitis: Secondary | ICD-10-CM | POA: Diagnosis not present

## 2019-05-30 DIAGNOSIS — E0590 Thyrotoxicosis, unspecified without thyrotoxic crisis or storm: Secondary | ICD-10-CM | POA: Diagnosis not present

## 2019-05-30 DIAGNOSIS — Z0000 Encounter for general adult medical examination without abnormal findings: Secondary | ICD-10-CM | POA: Diagnosis not present

## 2019-05-30 DIAGNOSIS — M4856XA Collapsed vertebra, not elsewhere classified, lumbar region, initial encounter for fracture: Secondary | ICD-10-CM | POA: Diagnosis not present

## 2019-05-30 DIAGNOSIS — N183 Chronic kidney disease, stage 3 unspecified: Secondary | ICD-10-CM

## 2019-05-30 DIAGNOSIS — S72141D Displaced intertrochanteric fracture of right femur, subsequent encounter for closed fracture with routine healing: Secondary | ICD-10-CM | POA: Diagnosis not present

## 2019-05-30 DIAGNOSIS — I5032 Chronic diastolic (congestive) heart failure: Secondary | ICD-10-CM | POA: Diagnosis not present

## 2019-05-30 DIAGNOSIS — J441 Chronic obstructive pulmonary disease with (acute) exacerbation: Secondary | ICD-10-CM

## 2019-05-30 DIAGNOSIS — F0391 Unspecified dementia with behavioral disturbance: Secondary | ICD-10-CM

## 2019-05-30 MED ORDER — PROPRANOLOL HCL 20 MG PO TABS: 20.0000 mg | ORAL_TABLET | Freq: Two times a day (BID) | ORAL | 1 refills | Status: DC

## 2019-05-30 MED ORDER — ZOSTER VAC RECOMB ADJUVANTED 50 MCG/0.5ML IM SUSR: 0.5000 mL | Freq: Once | INTRAMUSCULAR | 1 refills | Status: AC

## 2019-05-30 NOTE — Addendum Note (Signed)
Addended by: Logan Bores on: 05/30/2019 04:19 PM   Modules accepted: Orders

## 2019-05-30 NOTE — Patient Instructions (Signed)
To stop bystolic To start propanolol 20 mg by mouth twice daily for HR and tremor.  To use tylenol 500 mg every 6 hours while awake, keeping total acetaminophen to < 3000 mg in 24 hours  Okay to increase protonix to 40 mg twice daily

## 2019-05-30 NOTE — Patient Instructions (Signed)
[name] , Thank you for taking time to come for your Medicare Wellness Visit. I appreciate your ongoing commitment to your health goals. Please review the following plan we discussed and let me know if I can assist you in the future.   Screening recommendations/referrals: Colonoscopy aged out Mammogram aged out Bone Density unable to perform  Recommended yearly ophthalmology/optometry visit for glaucoma screening and checkup Recommended yearly dental visit for hygiene and checkup  Vaccinations: Influenza vaccine 07/2019 Pneumococcal vaccine up to date Tdap vaccine up to date Shingles vaccine - look into home health giving, we will send to pharmacy     Advanced directives: to complete and bring back to office to place on file  Conditions/risks identified: worsen debility and memory loss   Next appointment: na    Preventive Care 81 Years and Older, Female Preventive care refers to lifestyle choices and visits with your health care provider that can promote health and wellness. What does preventive care include?  A yearly physical exam. This is also called an annual well check.  Dental exams once or twice a year.  Routine eye exams. Ask your health care provider how often you should have your eyes checked.  Personal lifestyle choices, including:  Daily care of your teeth and gums.  Regular physical activity.  Eating a healthy diet.  Avoiding tobacco and drug use.  Limiting alcohol use.  Practicing safe sex.  Taking low-dose aspirin every day.  Taking vitamin and mineral supplements as recommended by your health care provider. What happens during an annual well check? The services and screenings done by your health care provider during your annual well check will depend on your age, overall health, lifestyle risk factors, and family history of disease. Counseling  Your health care provider may ask you questions about your:  Alcohol use.  Tobacco use.  Drug use.   Emotional well-being.  Home and relationship well-being.  Sexual activity.  Eating habits.  History of falls.  Memory and ability to understand (cognition).  Work and work Statistician.  Reproductive health. Screening  You may have the following tests or measurements:  Height, weight, and BMI.  Blood pressure.  Lipid and cholesterol levels. These may be checked every 5 years, or more frequently if you are over 17 years old.  Skin check.  Lung cancer screening. You may have this screening every year starting at age 41 if you have a 30-pack-year history of smoking and currently smoke or have quit within the past 15 years.  Fecal occult blood test (FOBT) of the stool. You may have this test every year starting at age 62.  Flexible sigmoidoscopy or colonoscopy. You may have a sigmoidoscopy every 5 years or a colonoscopy every 10 years starting at age 53.  Hepatitis C blood test.  Hepatitis B blood test.  Sexually transmitted disease (STD) testing.  Diabetes screening. This is done by checking your blood sugar (glucose) after you have not eaten for a while (fasting). You may have this done every 1-3 years.  Bone density scan. This is done to screen for osteoporosis. You may have this done starting at age 66.  Mammogram. This may be done every 1-2 years. Talk to your health care provider about how often you should have regular mammograms. Talk with your health care provider about your test results, treatment options, and if necessary, the need for more tests. Vaccines  Your health care provider may recommend certain vaccines, such as:  Influenza vaccine. This is recommended every  year.  Tetanus, diphtheria, and acellular pertussis (Tdap, Td) vaccine. You may need a Td booster every 10 years.  Zoster vaccine. You may need this after age 19.  Pneumococcal 13-valent conjugate (PCV13) vaccine. One dose is recommended after age 60.  Pneumococcal polysaccharide (PPSV23)  vaccine. One dose is recommended after age 38. Talk to your health care provider about which screenings and vaccines you need and how often you need them. This information is not intended to replace advice given to you by your health care provider. Make sure you discuss any questions you have with your health care provider. Document Released: 12/14/2015 Document Revised: 08/06/2016 Document Reviewed: 09/18/2015 Elsevier Interactive Patient Education  2017 [place] Prevention in the Home Falls can cause injuries. They can happen to people of all ages. There are many things you can do to make your home safe and to help prevent falls. What can I do on the outside of my home?  Regularly fix the edges of walkways and driveways and fix any cracks.  Remove anything that might make you trip as you walk through a door, such as a raised step or threshold.  Trim any bushes or trees on the path to your home.  Use bright outdoor lighting.  Clear any walking paths of anything that might make someone trip, such as rocks or tools.  Regularly check to see if handrails are loose or broken. Make sure that both sides of any steps have handrails.  Any raised decks and porches should have guardrails on the edges.  Have any leaves, snow, or ice cleared regularly.  Use sand or salt on walking paths during winter.  Clean up any spills in your garage right away. This includes oil or grease spills. What can I do in the bathroom?  Use night lights.  Install grab bars by the toilet and in the tub and shower. Do not use towel bars as grab bars.  Use non-skid mats or decals in the tub or shower.  If you need to sit down in the shower, use a plastic, non-slip stool.  Keep the floor dry. Clean up any water that spills on the floor as soon as it happens.  Remove soap buildup in the tub or shower regularly.  Attach bath mats securely with double-sided non-slip rug tape.  Do not have throw rugs  and other things on the floor that can make you trip. What can I do in the bedroom?  Use night lights.  Make sure that you have a light by your bed that is easy to reach.  Do not use any sheets or blankets that are too big for your bed. They should not hang down onto the floor.  Have a firm chair that has side arms. You can use this for support while you get dressed.  Do not have throw rugs and other things on the floor that can make you trip. What can I do in the kitchen?  Clean up any spills right away.  Avoid walking on wet floors.  Keep items that you use a lot in easy-to-reach places.  If you need to reach something above you, use a strong step stool that has a grab bar.  Keep electrical cords out of the way.  Do not use floor polish or wax that makes floors slippery. If you must use wax, use non-skid floor wax.  Do not have throw rugs and other things on the floor that can make you trip. What can  I do with my stairs?  Do not leave any items on the stairs.  Make sure that there are handrails on both sides of the stairs and use them. Fix handrails that are broken or loose. Make sure that handrails are as long as the stairways.  Check any carpeting to make sure that it is firmly attached to the stairs. Fix any carpet that is loose or worn.  Avoid having throw rugs at the top or bottom of the stairs. If you do have throw rugs, attach them to the floor with carpet tape.  Make sure that you have a light switch at the top of the stairs and the bottom of the stairs. If you do not have them, ask someone to add them for you. What else can I do to help prevent falls?  Wear shoes that:  Do not have high heels.  Have rubber bottoms.  Are comfortable and fit you well.  Are closed at the toe. Do not wear sandals.  If you use a stepladder:  Make sure that it is fully opened. Do not climb a closed stepladder.  Make sure that both sides of the stepladder are locked into  place.  Ask someone to hold it for you, if possible.  Clearly mark and make sure that you can see:  Any grab bars or handrails.  First and last steps.  Where the edge of each step is.  Use tools that help you move around (mobility aids) if they are needed. These include:  Canes.  Walkers.  Scooters.  Crutches.  Turn on the lights when you go into a dark area. Replace any light bulbs as soon as they burn out.  Set up your furniture so you have a clear path. Avoid moving your furniture around.  If any of your floors are uneven, fix them.  If there are any pets around you, be aware of where they are.  Review your medicines with your doctor. Some medicines can make you feel dizzy. This can increase your chance of falling. Ask your doctor what other things that you can do to help prevent falls. This information is not intended to replace advice given to you by your health care provider. Make sure you discuss any questions you have with your health care provider. Document Released: 09/13/2009 Document Revised: 04/24/2016 Document Reviewed: 12/22/2014 Elsevier Interactive Patient Education  2017 [employer].

## 2019-05-30 NOTE — Progress Notes (Signed)
Subjective:   Virginia [name] is a 83 y.o. female who presents for Medicare Annual (Subsequent) preventive examination.  Review of Systems:   Cardiac Risk Factors include: advanced age (>6men, >29 women);obesity (BMI >30kg/m2);sedentary lifestyle;hypertension;dyslipidemia     Objective:     Vitals: LMP  (LMP Unknown)   There is no height or weight on file to calculate BMI.  Advanced Directives 05/30/2019 05/04/2019 04/28/2019 04/20/2019 12/14/2018 09/01/2018 07/01/2018  Does Patient Have a Medical Advance Directive? Yes Yes No No Yes Yes Yes  Type of Paramedic of [place];Living will Healthcare Power of [place];Living will -  Does patient want to make changes to medical advance directive? - - - - No - Patient declined - No - Patient declined  Copy of [place] in Chart? No - copy requested No - copy requested - - No - copy requested No - copy requested -  Would patient like information on creating a medical advance directive? - - No - Patient declined No - Patient declined - - No - Patient declined    Tobacco Social History   Tobacco Use  Smoking Status Former Smoker  . Packs/day: 0.25  . Years: 20.00  . Pack years: 5.00  . Types: Cigarettes  . Quit date: 04/2019  . Years since quitting: 0.1  Smokeless Tobacco Never Used  Tobacco Comment   4 or 5 cigarettes per day average     Counseling given: Not Answered Comment: 4 or 5 cigarettes per day average   Clinical Intake:  Pre-visit preparation completed: Yes  Pain : No/denies pain     BMI - recorded: 32.56 Nutritional Status: BMI > 30  Obese Nutritional Risks: Unintentional weight loss, Non-healing wound Diabetes: No  How often do you need to have someone help you when you read instructions, pamphlets, or other written materials from your doctor or pharmacy?: 5 - Always What is the last grade level you completed in  school?: college  Interpreter Needed?: No     Past Medical History:  Diagnosis Date  . Anxiety 07/08/2017  . Arthritis   . Carotid artery disease ([place])    a. s/p L CEA. b. followed by [facility].  . Carotid stenosis 10/19/2012  . Chronic diastolic CHF (congestive heart failure) ([place])   . CKD (chronic kidney disease), stage III ([place])   . Congestive heart disease ([place])   . COPD (chronic obstructive pulmonary disease) ([place])   . COPD GOLD IV 12/30/2014   PFTs 12/15/14 FEV1  0.57 (29%) ratio 54 p 17% resp to saba    . Cough 01/31/2016  . Dementia with behavioral disturbance ([place]) 07/08/2017  . Diverticulosis   . Dyspnea   . Essential hypertension 12/02/2014  . Fall at home Sept. 2013  . Family history of adverse reaction to anesthesia    SON IS SLOW TO WAKE   . Gastroesophageal reflux disease 07/08/2017  . Hyperlipidemia   . Hypertension   . Hyponatremia 12/02/2014  . Hypotension 02/01/2017  . Incontinent of urine   . Internal hemorrhoid   . Intertrochanteric fracture ([place]) 04/20/2019  . Memory disorder 10/24/2014  . Nausea and vomiting 11/30/2014  . Primary osteoarthritis involving multiple joints 07/08/2017  . Vitamin D deficiency 02/15/2015   Past Surgical History:  Procedure Laterality Date  . APPENDECTOMY    . BREAST REDUCTION SURGERY    . CAROTID ENDARTERECTOMY     left CEA  . CATARACT  EXTRACTION Bilateral   . COLONOSCOPY  2015  . EYE SURGERY     cataracts removed, bilaterally   . INTRAMEDULLARY (IM) NAIL INTERTROCHANTERIC Right 04/21/2019   Procedure: INTRAMEDULLARY (IM) NAIL INTERTROCHANTRIC;  Surgeon: Rod Can, MD;  Location: [facility];  Service: Orthopedics;  Laterality: Right;  . skin cancer removal    . TONSILLECTOMY     Family History  Problem Relation Age of Onset  . Heart disease Father        Before age 32  . Hypertension Father   . Heart attack Father   . Cancer Mother 40       Brain  . Dementia Brother   . Cancer Maternal Aunt 61       breast cancer  . Diabetes  Maternal Aunt   . High blood pressure Brother   . Hypothyroidism Daughter   . High blood pressure Son   . High Cholesterol Son   . Diabetes Son   . COPD Son   . Obesity Son   . Heart failure Maternal Grandmother   . Diabetes Maternal Grandmother   . Stroke Neg Hx    Social History   Socioeconomic History  . Marital status: Divorced    Spouse name: Not on file  . Number of children: 2  . Years of education: 7  . Highest education level: Not on file  Occupational History  . Occupation: retired  Scientific laboratory technician  . Financial resource strain: Not hard at all  . Food insecurity    Worry: Never true    Inability: Never true  . Transportation needs    Medical: No    Non-medical: No  Tobacco Use  . Smoking status: Former Smoker    Packs/day: 0.25    Years: 20.00    Pack years: 5.00    Types: Cigarettes    Quit date: 04/2019    Years since quitting: 0.1  . Smokeless tobacco: Never Used  . Tobacco comment: 4 or 5 cigarettes per day average  Substance and Sexual Activity  . Alcohol use: Yes    Alcohol/week: 0.0 standard drinks    Comment: Rare glass of wine  . Drug use: No  . Sexual activity: Not Currently    Birth control/protection: Post-menopausal  Lifestyle  . Physical activity    Days per week: 1 day    Minutes per session: 40 min  . Stress: Only a little  Relationships  . Social connections    Talks on phone: More than three times a week    Gets together: More than three times a week    Attends religious service: Never    Active member of club or organization: Yes    Attends meetings of clubs or organizations: 1 to 4 times per year    Relationship status: Divorced  Other Topics Concern  . Not on file  Social History Narrative   04/05/18 lives with son and brother, [name]   Diet:       Do you drink/eat things with caffeine: yes      Marital Status: Divorced   What Year Married:1959      Do you live in a house, apartment, Assisted Living, Condo, trailer? House       Is it one or more stories? 2 stories      How many persons live in your home? Just Patient      Do you have any pets in your home? 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 Very Little   Type and how often? Walk      Do you have a living will? None   DNR?   Discuss one? No      Do you have signed POA/HPOA forms?      Patient drinks 1 cup of caffeine daily.   Patient is right handed.    Outpatient Encounter Medications as of 05/30/2019  Medication Sig  . albuterol (PROVENTIL) (2.5 MG/3ML) 0.083% nebulizer solution Take 6 mLs (5 mg total) by nebulization every 4 (four) hours as needed for wheezing or shortness of breath.  . ALPRAZolam (XANAX) 0.25 MG tablet 0.5-1 tablet every 6 hours as needed severe anxiety, not to exceed 2 tablet in 24 hours  . Carbinoxamine Maleate 4 MG TABS Take 1 tablet (4 mg total) by mouth every 8 (eight) hours as needed.  . Cholecalciferol (D3 SUPER STRENGTH) 2000 units CAPS Take 1 capsule (2,000 Units total) by mouth daily.  Marland Kitchen Dextromethorphan-guaiFENesin (MUCINEX DM) 30-600 MG TB12 Take 2 tablets by mouth 2 (two) times daily.  Marland Kitchen docusate sodium (COLACE) 100 MG capsule Take 1 capsule (100 mg total) by mouth 2 (two) times daily as needed for mild constipation.  Marland Kitchen donepezil (ARICEPT) 10 MG tablet TAKE 1 TABLET BY MOUTH EVERY DAY  . doxycycline (VIBRA-TABS) 100 MG tablet Take 1 tablet (100 mg total) by mouth 2 (two) times daily.  Marland Kitchen enoxaparin (LOVENOX) 40 MG/0.4ML injection Inject 0.4 mLs (40 mg total) into the skin daily.  . furosemide (LASIX) 20 MG tablet Take 1 tablet (20 mg total) by mouth daily.  Marland Kitchen HYDROcodone-acetaminophen (NORCO/VICODIN) 5-325 MG tablet Take 1-2 tablets by mouth every 6 (six) hours as needed for moderate pain.  Marland Kitchen ipratropium-albuterol (DUONEB) 0.5-2.5 (3) MG/3ML SOLN inhale contents of 1 vial in nebulizer every 6 hours  . memantine (NAMENDA) 10 MG tablet Take 1 tablet (10 mg total) by mouth 2 (two)  times daily.  . ondansetron (ZOFRAN-ODT) 4 MG disintegrating tablet DISSOLVE 1 TABLET ON THE TONGUE IN THE MORNING  . pantoprazole (PROTONIX) 40 MG tablet Take 40 mg by mouth 2 (two) times daily.  . sertraline (ZOLOFT) 50 MG tablet Take 1.5 tablets (75 mg total) by mouth daily.  . [DISCONTINUED] predniSONE (STERAPRED UNI-PAK 21 TAB) 10 MG (21) TBPK tablet Use as directed (Patient not taking: Reported on 05/30/2019)   No facility-administered encounter medications on file as of 05/30/2019.     Activities of Daily Living In your present state of health, do you have any difficulty performing the following activities: 05/30/2019 04/20/2019  Hearing? N N  Vision? Y N  Difficulty concentrating or making decisions? Tempie Donning  Walking or climbing stairs? Y -  Dressing or bathing? Y Y  Doing errands, shopping? Y -  Conservation officer, nature and eating ? Y -  Using the Toilet? Y -  In the past six months, have you accidently leaked urine? Y -  Do you have problems with loss of bowel control? Y -  Managing your Medications? Y -  Managing your Finances? Y -  Housekeeping or managing your Housekeeping? Y -  Some recent data might be hidden    Patient Care Team: Lauree Chandler, NP as PCP - General (Nurse Practitioner) Fay Records, MD as PCP - Cardiology (Cardiology) Tanda Rockers, MD as Consulting Physician (Pulmonary Disease) Conan Bowens, RN as Registered Nurse [facility] and Palliative Medicine) Pyrtle, Lajuan Lines, MD as Consulting Physician (  Gastroenterology)    Assessment:   This is a routine wellness examination for Trail.  Exercise Activities and Dietary recommendations Current Exercise Habits: Home exercise routine, Type of exercise: calisthenics, Time (Minutes): 30, Frequency (Times/Week): 3, Weekly Exercise (Minutes/Week): 90, Intensity: Moderate, Exercise limited by: orthopedic condition(s);psychological condition(s)  Goals    . Increase water intake     Starting 03/30/2017 I will increase  water intake 1-2 cups a day based on what cardiologist stays.       Fall Risk Fall Risk  05/30/2019 05/20/2019 04/28/2019 12/14/2018 09/01/2018  Falls in the past year? 1 0 1 0 No  Number falls in past yr: 1 0 1 0 -  Injury with Fall? 1 0 1 0 -  Comment - - - - -  Risk for fall due to : History of fall(s);Impaired balance/gait - - - -   Is the patient's home free of loose throw rugs in walkways, pet beds, electrical cords, etc?   no      Grab bars in the bathroom? no      Handrails on the stairs?   no      Adequate lighting?   yes  Timed Get Up and Go performed: unable.   Depression Screen PHQ 2/9 Scores 05/30/2019 05/20/2019 12/14/2018 07/01/2018  PHQ - 2 Score 0 0 0 0     Cognitive Function MMSE - Mini Mental State Exam 04/05/2018 11/19/2017 05/13/2017 03/30/2017 11/12/2016  Orientation to time 2 4 0 3 2  Orientation to Place 3 3 4 3 4   Registration 3 3 3 3 3   Attention/ Calculation 0 1 5 4 2   Recall 0 0 0 0 0  Language- name 2 objects 2 2 2 2 2   Language- repeat 0 0 1 1 1   Language- follow 3 step command 3 3 3 1 3   Language- read & follow direction 1 1 1 1 1   Write a sentence 1 1 1 1 1   Copy design 0 0 1 1 1   Total score 15 18 21 20 20      6CIT Screen 05/30/2019  What Year? 4 points  What month? 3 points  What time? 3 points  Count back from 20 4 points    Immunization History  Administered Date(s) Administered  . Influenza Split 08/01/2014  . Influenza, High Dose Seasonal PF 09/08/2017, 09/01/2018  . Influenza,inj,Quad PF,6+ Mos 08/16/2015, 08/15/2016  . Pneumococcal Conjugate-13 03/30/2017  . Pneumococcal-Unspecified 08/01/2014  . Tdap 01/16/2018    Qualifies for Shingles Vaccine?yes  Screening Tests Health Maintenance  Topic Date Due  . INFLUENZA VACCINE  07/02/2019  . TETANUS/TDAP  01/17/2028  . DEXA SCAN  Completed  . PNA vac Low Risk Adult  Completed    Cancer Screenings: Lung: Low Dose CT Chest recommended if Age 13-80 years, 30 pack-year currently  smoking OR have quit w/in 15years. Patient does not qualify. Breast:  Up to date on Mammogram? Aged out   Up to date of Bone Density/Dexa? np Colorectal: aged out  Additional Screenings:  Hepatitis C Screening: na     Plan:     I have personally reviewed and noted the following in the patient's chart:   . Medical and social history . Use of alcohol, tobacco or illicit drugs  . Current medications and supplements . Functional ability and status . Nutritional status . Physical activity . Advanced directives . List of other physicians . Hospitalizations, surgeries, and ER visits in previous 12 months . Vitals . Screenings to  include cognitive, depression, and falls . Referrals and appointments  In addition, I have reviewed and discussed with patient certain preventive protocols, quality metrics, and best practice recommendations. A written personalized care plan for preventive services as well as general preventive health recommendations were provided to patient.     Lauree Chandler, NP  05/30/2019

## 2019-05-30 NOTE — Progress Notes (Signed)
    This service is provided via telemedicine  No vital signs collected/recorded due to the encounter was a telemedicine visit.   Location of patient (ex: home, work):  Home  Patient consents to a telephone visit:  Yes   Location of the provider (ex: office, home):  [facility], Office   Name of any referring provider:  N/A  Names of all persons participating in the telemedicine service and their role in the encounter:  S.Chrae B/CMA, Sherrie Mustache, NP,  Daughter [name]), and Patient   Time spent on call:  10 min with medical assistant

## 2019-05-30 NOTE — Progress Notes (Signed)
    This service is provided via telemedicine  No vital signs collected/recorded due to the encounter was a telemedicine visit.   Location of patient (ex: home, work): Home  Patient consents to a telephone visit:  Yes  Location of the provider (ex: office, home):[facility], Office   Name of any referring provider:  N/A  Names of all persons participating in the telemedicine service and their role in the encounter: S.Chrae B/CMA, Sherrie Mustache, NP, [name] (daughter) and Patient   Time spent on call:  10 min with medical assistant

## 2019-05-30 NOTE — Progress Notes (Addendum)
Careteam: Patient Care Team: Virginia Chandler, NP as PCP - General (Nurse Practitioner) Virginia Records, MD as PCP - Cardiology (Cardiology) Virginia Rockers, MD as Consulting Physician (Pulmonary Disease) Virginia Bowens, RN as Registered Nurse [facility] and Palliative Medicine) Virginia Fredrickson Lajuan Lines, MD as Consulting Physician (Gastroenterology)  Advanced Directive information    Allergies  Allergen Reactions  . Lidocaine Anaphylaxis, Swelling, Rash and Other (See Comments)    Any of the " [facility] "  . Other Other (See Comments)    All drugs that end in "cane'-  novacane etc. Daughter states patient almost died when she had it when she was born    Chief Complaint  Patient presents with  . Medical Management of Chronic Issues    6 month follow-up. Discuss appointment with [facility]. FYI diagnosised with a renal cyst. Patient with ongoing dizziness and worsening of left arm tremors.   . Medication Management    Discuss protonix dose   . Altered Mental Status    Patient with increased combative behavior      HPI: Patient is a 83 y.o. female for routine follow up.  Having major phelm after eating.  Reports she is miserable. Tremor worse, dizziness worse, pain worse. More agitated.  Prednisone helped significantly when treating COPD excerebration but now off this and anxiety, pain are worse.   Had MRI of back and fount renal cyst  Found to have Acute to early subacute compression fracture involving the  superior endplate of L3 with up to 30% height loss without bony retropulsion. This is benign/mechanical in appearance. 2. Mild for age multilevel degenerative spondylolysis as above. Resultant mild bilateral lateral recess narrowing at L3-4. No other significant canal or foraminal stenosis. No impingement. In a lot of pain. Giving her tylenol 250 mg tablet which was not helping. Then would give half a hydrocodone-apap (does not like taking this)  lovenox has been stopped  and she is now on ASA daily.   Constipation- stools now soft on colace and miralax daily  Anxiety- ongoing, now on zoloft 75 mg daily. Continues to have increase in anxiety. Using alprazolam when she is very anxious and combative 1-2 times daily   GERD- continues on Protonix was given twice daily but then bottle says once daily, cut it back but she is having more phelm after cutting back to daily.   htn- continues on bystolic 5 mg daily and tolerating well. HR- 82, 99, 97, 80  COPD- continues to improved, last doxycycline tonight.   Dementia- progressive decline. Needing total care by family and caregivers. Continues on namenda and aricept.   Weight loss- Eating minimally, when she was on prednisone she ate better. Pushing protein bars as cookies.  Got up to table last night.   CHF- stable, no swelling in legs, no increase cough. Taking lasix 20 mg daily.   Review of Systems:  Review of Systems  Unable to perform ROS: Dementia    Past Medical History:  Diagnosis Date  . Anxiety 07/08/2017  . Arthritis   . Carotid artery disease ([place])    a. s/p L CEA. b. followed by [facility].  . Carotid stenosis 10/19/2012  . Chronic diastolic CHF (congestive heart failure) ([place])   . CKD (chronic kidney disease), stage III ([place])   . Congestive heart disease ([place])   . COPD (chronic obstructive pulmonary disease) ([place])   . COPD GOLD IV 12/30/2014   PFTs 12/15/14 FEV1  0.57 (29%) ratio 54 p  17% resp to saba    . Cough 01/31/2016  . Dementia with behavioral disturbance ([place]) 07/08/2017  . Diverticulosis   . Dyspnea   . Essential hypertension 12/02/2014  . Fall at home Sept. 2013  . Family history of adverse reaction to anesthesia    SON IS SLOW TO WAKE   . Gastroesophageal reflux disease 07/08/2017  . Hyperlipidemia   . Hypertension   . Hyponatremia 12/02/2014  . Hypotension 02/01/2017  . Incontinent of urine   . Internal hemorrhoid   . Intertrochanteric fracture ([place]) 04/20/2019  . Memory disorder  10/24/2014  . Nausea and vomiting 11/30/2014  . Primary osteoarthritis involving multiple joints 07/08/2017  . Vitamin D deficiency 02/15/2015   Past Surgical History:  Procedure Laterality Date  . APPENDECTOMY    . BREAST REDUCTION SURGERY    . CAROTID ENDARTERECTOMY     left CEA  . CATARACT EXTRACTION Bilateral   . COLONOSCOPY  2015  . EYE SURGERY     cataracts removed, bilaterally   . INTRAMEDULLARY (IM) NAIL INTERTROCHANTERIC Right 04/21/2019   Procedure: INTRAMEDULLARY (IM) NAIL INTERTROCHANTRIC;  Surgeon: Rod Can, MD;  Location: [place];  Service: Orthopedics;  Laterality: Right;  . skin cancer removal    . TONSILLECTOMY     Social History:   reports that she quit smoking about 8 weeks ago. Her smoking use included cigarettes. She has a 5.00 pack-year smoking history. She has never used smokeless tobacco. She reports current alcohol use. She reports that she does not use drugs.  Family History  Problem Relation Age of Onset  . Heart disease Father        Before age 9  . Hypertension Father   . Heart attack Father   . Cancer Mother 6       Brain  . Dementia Brother   . Cancer Maternal Aunt 61       breast cancer  . Diabetes Maternal Aunt   . High blood pressure Brother   . Hypothyroidism Daughter   . High blood pressure Son   . High Cholesterol Son   . Diabetes Son   . COPD Son   . Obesity Son   . Heart failure Maternal Grandmother   . Diabetes Maternal Grandmother   . Stroke Neg Hx     Medications: Patient's Medications  New Prescriptions   No medications on file  Previous Medications   ALBUTEROL (PROVENTIL) (2.5 MG/3ML) 0.083% NEBULIZER SOLUTION    Take 6 mLs (5 mg total) by nebulization every 4 (four) hours as needed for wheezing or shortness of breath.   ALPRAZOLAM (XANAX) 0.25 MG TABLET    0.5-1 tablet every 6 hours as needed severe anxiety, not to exceed 2 tablet in 24 hours   CARBINOXAMINE MALEATE 4 MG TABS    Take 1 tablet (4 mg total) by mouth  every 8 (eight) hours as needed.   CHOLECALCIFEROL (D3 SUPER STRENGTH) 2000 UNITS CAPS    Take 1 capsule (2,000 Units total) by mouth daily.   DEXTROMETHORPHAN-GUAIFENESIN (MUCINEX DM) 30-600 MG TB12    Take 2 tablets by mouth 2 (two) times daily.   DOCUSATE SODIUM (COLACE) 100 MG CAPSULE    Take 1 capsule (100 mg total) by mouth 2 (two) times daily as needed for mild constipation.   DONEPEZIL (ARICEPT) 10 MG TABLET    TAKE 1 TABLET BY MOUTH EVERY DAY   DOXYCYCLINE (VIBRA-TABS) 100 MG TABLET    Take 1 tablet (100 mg total) by mouth  2 (two) times daily.   ENOXAPARIN (LOVENOX) 40 MG/0.4ML INJECTION    Inject 0.4 mLs (40 mg total) into the skin daily.   FUROSEMIDE (LASIX) 20 MG TABLET    Take 1 tablet (20 mg total) by mouth daily.   HYDROCODONE-ACETAMINOPHEN (NORCO/VICODIN) 5-325 MG TABLET    Take 1-2 tablets by mouth every 6 (six) hours as needed for moderate pain.   IPRATROPIUM-ALBUTEROL (DUONEB) 0.5-2.5 (3) MG/3ML SOLN    inhale contents of 1 vial in nebulizer every 6 hours   MEMANTINE (NAMENDA) 10 MG TABLET    Take 1 tablet (10 mg total) by mouth 2 (two) times daily.   ONDANSETRON (ZOFRAN-ODT) 4 MG DISINTEGRATING TABLET    DISSOLVE 1 TABLET ON THE TONGUE IN THE MORNING   PANTOPRAZOLE (PROTONIX) 40 MG TABLET    Take 40 mg by mouth 2 (two) times daily.   SERTRALINE (ZOLOFT) 50 MG TABLET    Take 1.5 tablets (75 mg total) by mouth daily.  Modified Medications   No medications on file  Discontinued Medications   No medications on file    Physical Exam:  There were no vitals filed for this visit. There is no height or weight on file to calculate BMI. Wt Readings from Last 3 Encounters:  04/21/19 178 lb (80.7 kg)  12/14/18 190 lb (86.2 kg)  12/07/18 187 lb 1.9 oz (84.9 kg)      Labs reviewed: Basic Metabolic Panel: Recent Labs    06/21/18 0000 08/30/18 1423  04/22/19 0112 04/25/19 1110 04/26/19 1045 05/25/19  NA 142 141   < > 142 142 144 141  K 4.1 4.8   < > 4.2 3.6 3.4* 4.1  CL  104 99   < > 107 104 105  --   CO2 24 27   < > 26 27 28   --   GLUCOSE 97 131*   < > 127* 117* 114*  --   BUN 17 20   < > 14 18 15 12   CREATININE 1.16* 1.39*   < > 0.94 0.74 0.76 0.8  CALCIUM 8.6* 8.7   < > 8.4* 8.5* 8.8*  --   TSH 0.603 0.422*  --   --   --   --  0.35*   < > = values in this interval not displayed.   Liver Function Tests: Recent Labs    06/21/18 0000 08/30/18 1423  AST 20 19  ALT 15 13  ALKPHOS 56 67  BILITOT 1.2 0.8  PROT 5.5* 5.8*  ALBUMIN 3.3* 3.8   No results for input(s): LIPASE, AMYLASE in the last 8760 hours. No results for input(s): AMMONIA in the last 8760 hours. CBC: Recent Labs    04/20/19 0456 04/21/19 0747 04/22/19 0112 04/25/19 1110 05/25/19  WBC 10.7* 10.4 9.9 8.3 8.2  NEUTROABS 8.0*  --   --   --  6  HGB 13.6 12.3 10.0* 9.2* 12.0  HCT 43.2 40.0 31.7* 28.9* 38  MCV 94.3 96.2 94.3 93.2  --   PLT 234 185 191 230 283   Lipid Panel: Recent Labs    06/21/18 0000  CHOL 189  HDL 58  LDLCALC 103*  TRIG 140  CHOLHDL 3.3   TSH: Recent Labs    06/21/18 0000 08/30/18 1423 05/25/19  TSH 0.603 0.422* 0.35*   A1C: Lab Results  Component Value Date   HGBA1C 5.2 12/10/2017     Assessment/Plan 1. Subclinical hyperthyroidism -pt previously on bystolic but due to tremor will stop and  add propranolol 20 mg by mouth twice daily - propranolol (INDERAL) 20 MG tablet; Take 1 tablet (20 mg total) by mouth 2 (two) times daily.  Dispense: 60 tablet; Refill: 1  2. Tremor -ongoing, due to tachcardia with tremor and elevated TSH will add propranolol 20 mg BID - propranolol (INDERAL) 20 MG tablet; Take 1 tablet (20 mg total) by mouth 2 (two) times daily.  Dispense: 60 tablet; Refill: 1  3. Essential hypertension Has improved, will stop bystolic and start propranolol at this time.  - propranolol (INDERAL) 20 MG tablet; Take 1 tablet (20 mg total) by mouth 2 (two) times daily.  Dispense: 60 tablet; Refill: 1  4. Closed displaced  intertrochanteric fracture of right femur with routine healing, subsequent encounter Ongoing, plans to restart PT, encouraged to use tylenol 500 mg TID routinely to help control pain. Still keeping to <3000 mg of acetaminophen in 24 hours.   5. COPD exacerbation ([place]) Continues to improve, last dose of doxycycline today. Continue on nebulizer and mucinex DM as needed   6. Dementia with behavioral disturbance, unspecified dementia type ([place]) -progressive decline, requiring total care by family and caregiver. Has had weight loss due to poor intake. At risk for ongoing decline. Family very supportive. Continues on aricept and namenda.   7. Gastroesophageal reflux disease without esophagitis Increase in phelm after eating since she has decreased Protonix to daily, to increase back to BID dosing.   8. Anxiety Ongoing, continues on zoloft 75 mg with alprazolam as needed   9. Chronic diastolic CHF (congestive heart failure) (HCC) Stable, continues on lasix 20 mg daily  10. CKD (chronic kidney disease), stage III (HCC) Stable, has been followed by nephrology. Encourage proper hydration and to avoid NSAIDS (Aleve, Advil, Motrin, Ibuprofen)   11. Lumbar back pain Went to see neurosurgery this morning, pain management vs surgery intervention due to compression fracture of L3, for now they have opted for conservative management. encouraged to take tylenol 500 mg by mouth three times daily, to help with pain management.   Next appt: 1 week to follow up HR, BP, tremor and pain. Carlos American. Virginia [name], [facility] [phone]    Addendum:  Daughter called back and request a new home order, plan to go with a different agency, order placed.  Virtual Visit via Video Note  I connected with Virginia [name] and daughter on 05/30/19 at  1:00 PM EDT by a video enabled telemedicine application and verified that I am speaking with the correct person using two identifiers.   Location: Patient: home Provider: office   I discussed the limitations of evaluation and management by telemedicine and the availability of in person appointments. The patient expressed understanding and agreed to proceed.    I discussed the assessment and treatment plan with the patient. The patient was provided an opportunity to ask questions and all were answered. The patient agreed with the plan and demonstrated an understanding of the instructions.   The patient was advised to call back or seek an in-person evaluation if the symptoms worsen or if the condition fails to improve as anticipated.  I provided 35 minutes of non-face-to-face time during this encounter.  Carlos American. Dewaine Oats, AGNP Avs printed and mailed.

## 2019-05-31 ENCOUNTER — Telehealth: Payer: Self-pay | Admitting: *Deleted

## 2019-05-31 NOTE — Telephone Encounter (Signed)
To hold propranolol for HR <60 and/or SBP <100, home health orders placed

## 2019-05-31 NOTE — Telephone Encounter (Signed)
Daughter notified and agreed.  

## 2019-05-31 NOTE — Telephone Encounter (Signed)
[name],daughter called stating that she wanted [facility] for patient's Heart Rate and Blood Pressures. Please Advise.

## 2019-05-31 NOTE — Addendum Note (Signed)
Addended by: Lauree Chandler on: 05/31/2019 03:24 PM   Modules accepted: Orders

## 2019-05-31 NOTE — Telephone Encounter (Signed)
Virginia [name], daughter called and stated that she is discharging [name].  She wants a referral placed for [facility] for PT instead. Stated that she is requesting for [name] to come out to home. Stated that she spoke with him and he can do an assessment and treatment tomorrow if order is placed.  Please Advise.

## 2019-06-02 ENCOUNTER — Telehealth: Payer: Self-pay | Admitting: *Deleted

## 2019-06-02 DIAGNOSIS — S72141D Displaced intertrochanteric fracture of right femur, subsequent encounter for closed fracture with routine healing: Secondary | ICD-10-CM | POA: Diagnosis not present

## 2019-06-02 DIAGNOSIS — Z85828 Personal history of other malignant neoplasm of skin: Secondary | ICD-10-CM

## 2019-06-02 DIAGNOSIS — I5032 Chronic diastolic (congestive) heart failure: Secondary | ICD-10-CM | POA: Diagnosis not present

## 2019-06-02 DIAGNOSIS — M150 Primary generalized (osteo)arthritis: Secondary | ICD-10-CM

## 2019-06-02 DIAGNOSIS — Z9181 History of falling: Secondary | ICD-10-CM | POA: Diagnosis not present

## 2019-06-02 DIAGNOSIS — R41841 Cognitive communication deficit: Secondary | ICD-10-CM | POA: Diagnosis not present

## 2019-06-02 DIAGNOSIS — N183 Chronic kidney disease, stage 3 (moderate): Secondary | ICD-10-CM | POA: Diagnosis not present

## 2019-06-02 DIAGNOSIS — M545 Low back pain: Secondary | ICD-10-CM | POA: Diagnosis not present

## 2019-06-02 DIAGNOSIS — F0391 Unspecified dementia with behavioral disturbance: Secondary | ICD-10-CM | POA: Diagnosis not present

## 2019-06-02 DIAGNOSIS — I130 Hypertensive heart and chronic kidney disease with heart failure and stage 1 through stage 4 chronic kidney disease, or unspecified chronic kidney disease: Secondary | ICD-10-CM | POA: Diagnosis not present

## 2019-06-02 DIAGNOSIS — M1711 Unilateral primary osteoarthritis, right knee: Secondary | ICD-10-CM | POA: Diagnosis not present

## 2019-06-02 NOTE — Telephone Encounter (Signed)
Pt's daughter calling asking for an order for new tubing for nebulizer, daughter states it got wet? Daughter is also asking if pt can switch to prozac instead of zoloft? Per daughter it worked for her 23 years ago and pt is still experiencing dizziness. Please advise, order for tubing should be sent to [facility] (I am not sure how to put in order for just tubing?)

## 2019-06-02 NOTE — Telephone Encounter (Signed)
Tubing should be okay if it got wet she can dry it off it is still okay to use, we can address the anti-anxiety medication at her follow up next week.

## 2019-06-02 NOTE — Telephone Encounter (Signed)
Spoke with daughter and advised results.  

## 2019-06-03 ENCOUNTER — Other Ambulatory Visit: Payer: Self-pay | Admitting: Nurse Practitioner

## 2019-06-03 DIAGNOSIS — J441 Chronic obstructive pulmonary disease with (acute) exacerbation: Secondary | ICD-10-CM

## 2019-06-06 DIAGNOSIS — F0391 Unspecified dementia with behavioral disturbance: Secondary | ICD-10-CM | POA: Diagnosis not present

## 2019-06-06 DIAGNOSIS — M545 Low back pain: Secondary | ICD-10-CM | POA: Diagnosis not present

## 2019-06-06 DIAGNOSIS — I130 Hypertensive heart and chronic kidney disease with heart failure and stage 1 through stage 4 chronic kidney disease, or unspecified chronic kidney disease: Secondary | ICD-10-CM | POA: Diagnosis not present

## 2019-06-06 DIAGNOSIS — S72141D Displaced intertrochanteric fracture of right femur, subsequent encounter for closed fracture with routine healing: Secondary | ICD-10-CM | POA: Diagnosis not present

## 2019-06-06 DIAGNOSIS — I5032 Chronic diastolic (congestive) heart failure: Secondary | ICD-10-CM | POA: Diagnosis not present

## 2019-06-06 DIAGNOSIS — N183 Chronic kidney disease, stage 3 (moderate): Secondary | ICD-10-CM | POA: Diagnosis not present

## 2019-06-07 ENCOUNTER — Ambulatory Visit (INDEPENDENT_AMBULATORY_CARE_PROVIDER_SITE_OTHER): Payer: Medicare Other | Admitting: Nurse Practitioner

## 2019-06-07 ENCOUNTER — Encounter: Payer: Self-pay | Admitting: Nurse Practitioner

## 2019-06-07 ENCOUNTER — Other Ambulatory Visit: Payer: Self-pay

## 2019-06-07 DIAGNOSIS — M545 Low back pain: Secondary | ICD-10-CM | POA: Diagnosis not present

## 2019-06-07 DIAGNOSIS — F419 Anxiety disorder, unspecified: Secondary | ICD-10-CM

## 2019-06-07 DIAGNOSIS — S72141D Displaced intertrochanteric fracture of right femur, subsequent encounter for closed fracture with routine healing: Secondary | ICD-10-CM | POA: Diagnosis not present

## 2019-06-07 DIAGNOSIS — I130 Hypertensive heart and chronic kidney disease with heart failure and stage 1 through stage 4 chronic kidney disease, or unspecified chronic kidney disease: Secondary | ICD-10-CM | POA: Diagnosis not present

## 2019-06-07 DIAGNOSIS — I5032 Chronic diastolic (congestive) heart failure: Secondary | ICD-10-CM | POA: Diagnosis not present

## 2019-06-07 DIAGNOSIS — R251 Tremor, unspecified: Secondary | ICD-10-CM

## 2019-06-07 DIAGNOSIS — N183 Chronic kidney disease, stage 3 (moderate): Secondary | ICD-10-CM | POA: Diagnosis not present

## 2019-06-07 DIAGNOSIS — I10 Essential (primary) hypertension: Secondary | ICD-10-CM | POA: Diagnosis not present

## 2019-06-07 DIAGNOSIS — K219 Gastro-esophageal reflux disease without esophagitis: Secondary | ICD-10-CM | POA: Diagnosis not present

## 2019-06-07 DIAGNOSIS — E0590 Thyrotoxicosis, unspecified without thyrotoxic crisis or storm: Secondary | ICD-10-CM

## 2019-06-07 DIAGNOSIS — F0391 Unspecified dementia with behavioral disturbance: Secondary | ICD-10-CM | POA: Diagnosis not present

## 2019-06-07 NOTE — Progress Notes (Signed)
This service is provided via telemedicine  No vital signs collected/recorded due to the encounter was a telemedicine visit.   Location of patient (ex: home, work):  Home   Patient consents to a telephone visit: Yes  Location of the provider (ex: office, home):  [facility], Office   Name of any referring provider:  N/A  Names of all persons participating in the telemedicine service and their role in the encounter:  S.Chrae B/CMA, Sherrie Mustache, NP, [name](daughter), Mo ([facility] Nurse) and Patient   Time spent on call: 6 min with medical assistant      Careteam: Patient Care Team: Lauree Chandler, NP as PCP - General (Nurse Practitioner) Fay Records, MD as PCP - Cardiology (Cardiology) Tanda Rockers, MD as Consulting Physician (Pulmonary Disease) Conan Bowens, RN as Registered Nurse (Hospice and Palliative Medicine) Hilarie Fredrickson Lajuan Lines, MD as Consulting Physician (Gastroenterology)  Advanced Directive information    Allergies  Allergen Reactions  . Lidocaine Anaphylaxis, Swelling, Rash and Other (See Comments)    Any of the " [facility] "  . Other Other (See Comments)    All drugs that end in "cane'-  novacane etc. Daughter states patient almost died when she had it when she was born    Chief Complaint  Patient presents with  . Follow-up    1 week follow-up.  Telephone/Facetime visit   . Medication Refill    Discuss Propranolol. Patients daughter is given to patient once daily and questions instructions   . Orders    Verbal order provided for Mo with [facility] to see patient every other week for 8 weeks.      HPI: Patient is a 83 y.o. female for follow up.   She was started on propanolol but only given once daily. Feels like she is having LESS dizziness now that she is moving.  Overall tremor is better.   Reports she did not want to be alone in the middle of the night and the daughter got in the bed with her and noticed that her leg was  trembling too.   Working with PT, [facility] recommend [facility]. Had CNA working with her and has gotten her moving more.   Routinely giving her tylenol 500 TID and not needing additional pain medication.   GERD- no phelm with increase in protonix to BID.   Would like to continue on zoloft 75 mg by mouth daily and using alprazolam PRN   Review of Systems:  Review of Systems  Unable to perform ROS: Dementia    Past Medical History:  Diagnosis Date  . Anxiety 07/08/2017  . Arthritis   . Carotid artery disease ([place])    a. s/p L CEA. b. followed by [facility].  . Carotid stenosis 10/19/2012  . Chronic diastolic CHF (congestive heart failure) ([place])   . CKD (chronic kidney disease), stage III ([place])   . Congestive heart disease ([place])   . COPD (chronic obstructive pulmonary disease) ([place])   . COPD GOLD IV 12/30/2014   PFTs 12/15/14 FEV1  0.57 (29%) ratio 54 p 17% resp to saba    . Cough 01/31/2016  . Dementia with behavioral disturbance ([place]) 07/08/2017  . Diverticulosis   . Dyspnea   . Essential hypertension 12/02/2014  . Fall at home Sept. 2013  . Family history of adverse reaction to anesthesia    SON IS SLOW TO WAKE   . Gastroesophageal reflux disease 07/08/2017  . Hyperlipidemia   . Hypertension   .  Hyponatremia 12/02/2014  . Hypotension 02/01/2017  . Incontinent of urine   . Internal hemorrhoid   . Intertrochanteric fracture ([place]) 04/20/2019  . Memory disorder 10/24/2014  . Nausea and vomiting 11/30/2014  . Primary osteoarthritis involving multiple joints 07/08/2017  . Vitamin D deficiency 02/15/2015   Past Surgical History:  Procedure Laterality Date  . APPENDECTOMY    . BREAST REDUCTION SURGERY    . CAROTID ENDARTERECTOMY     left CEA  . CATARACT EXTRACTION Bilateral   . COLONOSCOPY  2015  . EYE SURGERY     cataracts removed, bilaterally   . INTRAMEDULLARY (IM) NAIL INTERTROCHANTERIC Right 04/21/2019   Procedure: INTRAMEDULLARY (IM) NAIL INTERTROCHANTRIC;  Surgeon: Rod Can, MD;   Location: [place];  Service: Orthopedics;  Laterality: Right;  . skin cancer removal    . TONSILLECTOMY     Social History:   reports that she quit smoking about 2 months ago. Her smoking use included cigarettes. She has a 5.00 pack-year smoking history. She has never used smokeless tobacco. She reports current alcohol use. She reports that she does not use drugs.  Family History  Problem Relation Age of Onset  . Heart disease Father        Before age 62  . Hypertension Father   . Heart attack Father   . Cancer Mother 31       Brain  . Dementia Brother   . Cancer Maternal Aunt 61       breast cancer  . Diabetes Maternal Aunt   . High blood pressure Brother   . Hypothyroidism Daughter   . High blood pressure Son   . High Cholesterol Son   . Diabetes Son   . COPD Son   . Obesity Son   . Heart failure Maternal Grandmother   . Diabetes Maternal Grandmother   . Stroke Neg Hx     Medications: Patient's Medications  New Prescriptions   No medications on file  Previous Medications   ALBUTEROL (PROVENTIL) (2.5 MG/3ML) 0.083% NEBULIZER SOLUTION    Take 6 mLs (5 mg total) by nebulization every 4 (four) hours as needed for wheezing or shortness of breath.   ALPRAZOLAM (XANAX) 0.25 MG TABLET    0.5-1 tablet every 6 hours as needed severe anxiety, not to exceed 2 tablet in 24 hours   CARBINOXAMINE MALEATE 4 MG TABS    Take 1 tablet (4 mg total) by mouth every 8 (eight) hours as needed.   CHOLECALCIFEROL (D3 SUPER STRENGTH) 2000 UNITS CAPS    Take 1 capsule (2,000 Units total) by mouth daily.   DEXTROMETHORPHAN-GUAIFENESIN (MUCINEX DM) 30-600 MG TB12    TAKE 2 TABLETS BY MOUTH TWICE DAILY   DOCUSATE SODIUM (COLACE) 100 MG CAPSULE    Take 1 capsule (100 mg total) by mouth 2 (two) times daily as needed for mild constipation.   DONEPEZIL (ARICEPT) 10 MG TABLET    TAKE 1 TABLET BY MOUTH EVERY DAY   FUROSEMIDE (LASIX) 20 MG TABLET    Take 1 tablet (20 mg total) by mouth daily.    HYDROCODONE-ACETAMINOPHEN (NORCO/VICODIN) 5-325 MG TABLET    Take 1-2 tablets by mouth every 6 (six) hours as needed for moderate pain.   IPRATROPIUM-ALBUTEROL (DUONEB) 0.5-2.5 (3) MG/3ML SOLN    inhale contents of 1 vial in nebulizer every 6 hours   MEMANTINE (NAMENDA) 10 MG TABLET    Take 1 tablet (10 mg total) by mouth 2 (two) times daily.   ONDANSETRON (ZOFRAN-ODT) 4 MG  DISINTEGRATING TABLET    DISSOLVE 1 TABLET ON THE TONGUE IN THE MORNING   PANTOPRAZOLE (PROTONIX) 40 MG TABLET    Take 40 mg by mouth 2 (two) times daily.   PROPRANOLOL (INDERAL) 20 MG TABLET    TAKE 1 TABLET(20 MG) BY MOUTH TWICE DAILY   SERTRALINE (ZOLOFT) 50 MG TABLET    Take 1.5 tablets (75 mg total) by mouth daily.  Modified Medications   No medications on file  Discontinued Medications   No medications on file    Physical Exam:  There were no vitals filed for this visit. There is no height or weight on file to calculate BMI. Wt Readings from Last 3 Encounters:  04/21/19 178 lb (80.7 kg)  12/14/18 190 lb (86.2 kg)  12/07/18 187 lb 1.9 oz (84.9 kg)    Labs reviewed: Basic Metabolic Panel: Recent Labs    06/21/18 0000 08/30/18 1423  04/22/19 0112 04/25/19 1110 04/26/19 1045 05/25/19  NA 142 141   < > 142 142 144 141  K 4.1 4.8   < > 4.2 3.6 3.4* 4.1  CL 104 99   < > 107 104 105  --   CO2 24 27   < > 26 27 28   --   GLUCOSE 97 131*   < > 127* 117* 114*  --   BUN 17 20   < > 14 18 15 12   CREATININE 1.16* 1.39*   < > 0.94 0.74 0.76 0.8  CALCIUM 8.6* 8.7   < > 8.4* 8.5* 8.8*  --   TSH 0.603 0.422*  --   --   --   --  0.35*   < > = values in this interval not displayed.   Liver Function Tests: Recent Labs    06/21/18 0000 08/30/18 1423  AST 20 19  ALT 15 13  ALKPHOS 56 67  BILITOT 1.2 0.8  PROT 5.5* 5.8*  ALBUMIN 3.3* 3.8   No results for input(s): LIPASE, AMYLASE in the last 8760 hours. No results for input(s): AMMONIA in the last 8760 hours. CBC: Recent Labs    04/20/19 0456 04/21/19  0747 04/22/19 0112 04/25/19 1110 05/25/19  WBC 10.7* 10.4 9.9 8.3 8.2  NEUTROABS 8.0*  --   --   --  6  HGB 13.6 12.3 10.0* 9.2* 12.0  HCT 43.2 40.0 31.7* 28.9* 38  MCV 94.3 96.2 94.3 93.2  --   PLT 234 185 191 230 283   Lipid Panel: Recent Labs    06/21/18 0000  CHOL 189  HDL 58  LDLCALC 103*  TRIG 140  CHOLHDL 3.3   TSH: Recent Labs    06/21/18 0000 08/30/18 1423 05/25/19  TSH 0.603 0.422* 0.35*   A1C: Lab Results  Component Value Date   HGBA1C 5.2 12/10/2017     Assessment/Plan 1. Subclinical hyperthyroidism -will follow, started on propranolol 20 mg BID for symptom control. Daughter was only given daily by mistake, will increase to BID  2. Tremor - improved with propranolol.  3. Closed displaced intertrochanteric fracture of right femur with routine healing, subsequent encounter -overall doing well with tylenol TID scheduled. Not needed hydrocodone- apap since tylenol has been given routinely. Working more with therapy and doing well.   4. Essential hypertension Stable at this time. No hypotension noted.  5. Gastroesophageal reflux disease without esophagitis -stable, has improved with increase in protonix back to BID.   6. Anxiety -stable on zoloft 75 mg daily with alprazolam 0.25 mg 1/2  tablet twice daily AS NEEDED.   Next appt: 6 week follow up- will need labs for home health to obtain prior to discharge of home health  Carlos American. Harle Battiest  [facility] [phone]   Virtual Visit via Video Note  I connected with Virginia [name] and daughter on 06/07/19 at  1:30 PM EDT by a video enabled telemedicine application and verified that I am speaking with the correct person using two identifiers.  Location: Patient: home Provider: office    I discussed the limitations of evaluation and management by telemedicine and the availability of in person appointments. The patient expressed understanding and agreed to proceed.     I discussed the assessment and treatment plan with the patient. The patient was provided an opportunity to ask questions and all were answered. The patient agreed with the plan and demonstrated an understanding of the instructions.   The patient was advised to call back or seek an in-person evaluation if the symptoms worsen or if the condition fails to improve as anticipated.  I provided 18 minutes of non-face-to-face time during this encounter.  Carlos American. Dewaine Oats, AGNP Avs printed and mailed.

## 2019-06-08 ENCOUNTER — Telehealth: Payer: Self-pay

## 2019-06-08 DIAGNOSIS — N183 Chronic kidney disease, stage 3 (moderate): Secondary | ICD-10-CM | POA: Diagnosis not present

## 2019-06-08 DIAGNOSIS — I130 Hypertensive heart and chronic kidney disease with heart failure and stage 1 through stage 4 chronic kidney disease, or unspecified chronic kidney disease: Secondary | ICD-10-CM | POA: Diagnosis not present

## 2019-06-08 DIAGNOSIS — M545 Low back pain: Secondary | ICD-10-CM | POA: Diagnosis not present

## 2019-06-08 DIAGNOSIS — F0391 Unspecified dementia with behavioral disturbance: Secondary | ICD-10-CM | POA: Diagnosis not present

## 2019-06-08 DIAGNOSIS — I5032 Chronic diastolic (congestive) heart failure: Secondary | ICD-10-CM | POA: Diagnosis not present

## 2019-06-08 DIAGNOSIS — S72141D Displaced intertrochanteric fracture of right femur, subsequent encounter for closed fracture with routine healing: Secondary | ICD-10-CM | POA: Diagnosis not present

## 2019-06-08 NOTE — Patient Instructions (Signed)
Make sure to increase propanolol to twice daily Continue all other medications as prescribed

## 2019-06-08 NOTE — Telephone Encounter (Signed)
Received a fax where the patient has requested a refill but she doesn't have a pending appt

## 2019-06-09 DIAGNOSIS — I5032 Chronic diastolic (congestive) heart failure: Secondary | ICD-10-CM | POA: Diagnosis not present

## 2019-06-09 DIAGNOSIS — N183 Chronic kidney disease, stage 3 (moderate): Secondary | ICD-10-CM | POA: Diagnosis not present

## 2019-06-09 DIAGNOSIS — M545 Low back pain: Secondary | ICD-10-CM | POA: Diagnosis not present

## 2019-06-09 DIAGNOSIS — S72141D Displaced intertrochanteric fracture of right femur, subsequent encounter for closed fracture with routine healing: Secondary | ICD-10-CM | POA: Diagnosis not present

## 2019-06-09 DIAGNOSIS — F0391 Unspecified dementia with behavioral disturbance: Secondary | ICD-10-CM | POA: Diagnosis not present

## 2019-06-09 DIAGNOSIS — I130 Hypertensive heart and chronic kidney disease with heart failure and stage 1 through stage 4 chronic kidney disease, or unspecified chronic kidney disease: Secondary | ICD-10-CM | POA: Diagnosis not present

## 2019-06-10 ENCOUNTER — Telehealth: Payer: Self-pay | Admitting: Neurology

## 2019-06-10 NOTE — Telephone Encounter (Signed)
Received request for Namenda XR refill, but prescriber is not at my office.   GERG from [facility] called , did not leave patient's phone number nor requested dose. Only DOB. I called the pharmacy and wasted two times 12 minutes on hold after I stated my case.   I declined refill.   Larey Seat, MD

## 2019-06-12 NOTE — Telephone Encounter (Signed)
Fidela Juneau NP is on vacations.I'm covering for her.I will verify medication refill with pharmacy. Thank You. Dinah Ngetich FNP-C

## 2019-06-13 MED ORDER — MEMANTINE HCL 10 MG PO TABS: 10.0000 mg | ORAL_TABLET | Freq: Two times a day (BID) | ORAL | 1 refills | Status: DC

## 2019-06-13 NOTE — Telephone Encounter (Signed)
Thank you FNP Ngetich,  I appreciate your help.

## 2019-06-13 NOTE — Addendum Note (Signed)
Addended by: Logan Bores on: 06/13/2019 08:39 AM   Modules accepted: Orders

## 2019-06-13 NOTE — Telephone Encounter (Signed)
Thank You.

## 2019-06-14 DIAGNOSIS — F0391 Unspecified dementia with behavioral disturbance: Secondary | ICD-10-CM | POA: Diagnosis not present

## 2019-06-14 DIAGNOSIS — N183 Chronic kidney disease, stage 3 (moderate): Secondary | ICD-10-CM | POA: Diagnosis not present

## 2019-06-14 DIAGNOSIS — M545 Low back pain: Secondary | ICD-10-CM | POA: Diagnosis not present

## 2019-06-14 DIAGNOSIS — I130 Hypertensive heart and chronic kidney disease with heart failure and stage 1 through stage 4 chronic kidney disease, or unspecified chronic kidney disease: Secondary | ICD-10-CM | POA: Diagnosis not present

## 2019-06-14 DIAGNOSIS — S72141D Displaced intertrochanteric fracture of right femur, subsequent encounter for closed fracture with routine healing: Secondary | ICD-10-CM | POA: Diagnosis not present

## 2019-06-14 DIAGNOSIS — I5032 Chronic diastolic (congestive) heart failure: Secondary | ICD-10-CM | POA: Diagnosis not present

## 2019-06-16 DIAGNOSIS — I130 Hypertensive heart and chronic kidney disease with heart failure and stage 1 through stage 4 chronic kidney disease, or unspecified chronic kidney disease: Secondary | ICD-10-CM | POA: Diagnosis not present

## 2019-06-16 DIAGNOSIS — S72141D Displaced intertrochanteric fracture of right femur, subsequent encounter for closed fracture with routine healing: Secondary | ICD-10-CM | POA: Diagnosis not present

## 2019-06-16 DIAGNOSIS — I5032 Chronic diastolic (congestive) heart failure: Secondary | ICD-10-CM | POA: Diagnosis not present

## 2019-06-16 DIAGNOSIS — N183 Chronic kidney disease, stage 3 (moderate): Secondary | ICD-10-CM | POA: Diagnosis not present

## 2019-06-16 DIAGNOSIS — F0391 Unspecified dementia with behavioral disturbance: Secondary | ICD-10-CM | POA: Diagnosis not present

## 2019-06-16 DIAGNOSIS — M545 Low back pain: Secondary | ICD-10-CM | POA: Diagnosis not present

## 2019-06-17 DIAGNOSIS — N183 Chronic kidney disease, stage 3 (moderate): Secondary | ICD-10-CM | POA: Diagnosis not present

## 2019-06-17 DIAGNOSIS — S72141D Displaced intertrochanteric fracture of right femur, subsequent encounter for closed fracture with routine healing: Secondary | ICD-10-CM | POA: Diagnosis not present

## 2019-06-17 DIAGNOSIS — M545 Low back pain: Secondary | ICD-10-CM | POA: Diagnosis not present

## 2019-06-17 DIAGNOSIS — I5032 Chronic diastolic (congestive) heart failure: Secondary | ICD-10-CM | POA: Diagnosis not present

## 2019-06-17 DIAGNOSIS — F0391 Unspecified dementia with behavioral disturbance: Secondary | ICD-10-CM | POA: Diagnosis not present

## 2019-06-17 DIAGNOSIS — I130 Hypertensive heart and chronic kidney disease with heart failure and stage 1 through stage 4 chronic kidney disease, or unspecified chronic kidney disease: Secondary | ICD-10-CM | POA: Diagnosis not present

## 2019-06-21 DIAGNOSIS — F0391 Unspecified dementia with behavioral disturbance: Secondary | ICD-10-CM | POA: Diagnosis not present

## 2019-06-21 DIAGNOSIS — N183 Chronic kidney disease, stage 3 (moderate): Secondary | ICD-10-CM | POA: Diagnosis not present

## 2019-06-21 DIAGNOSIS — S72141D Displaced intertrochanteric fracture of right femur, subsequent encounter for closed fracture with routine healing: Secondary | ICD-10-CM | POA: Diagnosis not present

## 2019-06-21 DIAGNOSIS — I5032 Chronic diastolic (congestive) heart failure: Secondary | ICD-10-CM | POA: Diagnosis not present

## 2019-06-21 DIAGNOSIS — I130 Hypertensive heart and chronic kidney disease with heart failure and stage 1 through stage 4 chronic kidney disease, or unspecified chronic kidney disease: Secondary | ICD-10-CM | POA: Diagnosis not present

## 2019-06-21 DIAGNOSIS — M545 Low back pain: Secondary | ICD-10-CM | POA: Diagnosis not present

## 2019-06-22 DIAGNOSIS — S72141D Displaced intertrochanteric fracture of right femur, subsequent encounter for closed fracture with routine healing: Secondary | ICD-10-CM | POA: Diagnosis not present

## 2019-06-22 DIAGNOSIS — I130 Hypertensive heart and chronic kidney disease with heart failure and stage 1 through stage 4 chronic kidney disease, or unspecified chronic kidney disease: Secondary | ICD-10-CM | POA: Diagnosis not present

## 2019-06-22 DIAGNOSIS — N183 Chronic kidney disease, stage 3 (moderate): Secondary | ICD-10-CM | POA: Diagnosis not present

## 2019-06-22 DIAGNOSIS — M545 Low back pain: Secondary | ICD-10-CM | POA: Diagnosis not present

## 2019-06-22 DIAGNOSIS — I5032 Chronic diastolic (congestive) heart failure: Secondary | ICD-10-CM | POA: Diagnosis not present

## 2019-06-22 DIAGNOSIS — F0391 Unspecified dementia with behavioral disturbance: Secondary | ICD-10-CM | POA: Diagnosis not present

## 2019-06-24 ENCOUNTER — Telehealth: Payer: Self-pay | Admitting: *Deleted

## 2019-06-24 DIAGNOSIS — N183 Chronic kidney disease, stage 3 (moderate): Secondary | ICD-10-CM | POA: Diagnosis not present

## 2019-06-24 DIAGNOSIS — I5032 Chronic diastolic (congestive) heart failure: Secondary | ICD-10-CM | POA: Diagnosis not present

## 2019-06-24 DIAGNOSIS — S72141D Displaced intertrochanteric fracture of right femur, subsequent encounter for closed fracture with routine healing: Secondary | ICD-10-CM | POA: Diagnosis not present

## 2019-06-24 DIAGNOSIS — F0391 Unspecified dementia with behavioral disturbance: Secondary | ICD-10-CM | POA: Diagnosis not present

## 2019-06-24 DIAGNOSIS — M545 Low back pain: Secondary | ICD-10-CM | POA: Diagnosis not present

## 2019-06-24 DIAGNOSIS — I130 Hypertensive heart and chronic kidney disease with heart failure and stage 1 through stage 4 chronic kidney disease, or unspecified chronic kidney disease: Secondary | ICD-10-CM | POA: Diagnosis not present

## 2019-06-24 NOTE — Telephone Encounter (Signed)
[name], daughter called and was upset and crying stating that someone called Social Services. Social Services has been mainly talking to her brother and he stated that she is not qualified to take care of patient. They have now sent in 2 Full time CNA's 9-9. Daughter is scared that they are going to take her mother from her home and away from her.   Daughter is wanting you to speak with Social Services and inform them that she has been providing good care for her mother.   Please Advise.  (daughter aware Jessica's is out of office till next week)

## 2019-06-27 ENCOUNTER — Telehealth: Payer: Self-pay | Admitting: *Deleted

## 2019-06-27 ENCOUNTER — Other Ambulatory Visit: Payer: Medicare Other | Admitting: *Deleted

## 2019-06-27 ENCOUNTER — Other Ambulatory Visit: Payer: Self-pay

## 2019-06-27 DIAGNOSIS — Z515 Encounter for palliative care: Secondary | ICD-10-CM | POA: Diagnosis not present

## 2019-06-27 NOTE — Telephone Encounter (Signed)
-----   Message from Lauree Chandler, NP sent at 06/26/2019  1:53 PM EDT ----- Regarding: Follow up Daughter called while I was on call today 7/26.. cough and congestion; stated if symptoms worsened she would need to go to ED for evaluation but if she continues to do okay we will follow up on Monday.. can we please check to see if she has gone to ED and put her down for follow up (unless she was admitted) - daughter wants to televisit. Thanks  Janett Billow

## 2019-06-27 NOTE — Telephone Encounter (Signed)
Virginia [name] agreed to appointment for Tuesday (none available today). Called and spoke with Daughter. Confirmed appointment for tomorrow, daughter agreed.

## 2019-06-27 NOTE — Telephone Encounter (Signed)
Noted, appt scheduled for tomorrow for cough and congestion, will discuss at that time.

## 2019-06-28 ENCOUNTER — Other Ambulatory Visit: Payer: Self-pay

## 2019-06-28 ENCOUNTER — Encounter: Payer: Self-pay | Admitting: Nurse Practitioner

## 2019-06-28 ENCOUNTER — Ambulatory Visit (INDEPENDENT_AMBULATORY_CARE_PROVIDER_SITE_OTHER): Payer: Medicare Other | Admitting: Nurse Practitioner

## 2019-06-28 DIAGNOSIS — J069 Acute upper respiratory infection, unspecified: Secondary | ICD-10-CM | POA: Diagnosis not present

## 2019-06-28 DIAGNOSIS — F419 Anxiety disorder, unspecified: Secondary | ICD-10-CM | POA: Diagnosis not present

## 2019-06-28 DIAGNOSIS — Z638 Other specified problems related to primary support group: Secondary | ICD-10-CM | POA: Diagnosis not present

## 2019-06-28 DIAGNOSIS — E0590 Thyrotoxicosis, unspecified without thyrotoxic crisis or storm: Secondary | ICD-10-CM | POA: Diagnosis not present

## 2019-06-28 DIAGNOSIS — N183 Chronic kidney disease, stage 3 (moderate): Secondary | ICD-10-CM | POA: Diagnosis not present

## 2019-06-28 DIAGNOSIS — R251 Tremor, unspecified: Secondary | ICD-10-CM | POA: Diagnosis not present

## 2019-06-28 DIAGNOSIS — I10 Essential (primary) hypertension: Secondary | ICD-10-CM

## 2019-06-28 DIAGNOSIS — M545 Low back pain: Secondary | ICD-10-CM | POA: Diagnosis not present

## 2019-06-28 DIAGNOSIS — I130 Hypertensive heart and chronic kidney disease with heart failure and stage 1 through stage 4 chronic kidney disease, or unspecified chronic kidney disease: Secondary | ICD-10-CM | POA: Diagnosis not present

## 2019-06-28 DIAGNOSIS — I5032 Chronic diastolic (congestive) heart failure: Secondary | ICD-10-CM

## 2019-06-28 DIAGNOSIS — F0391 Unspecified dementia with behavioral disturbance: Secondary | ICD-10-CM | POA: Diagnosis not present

## 2019-06-28 DIAGNOSIS — S72141D Displaced intertrochanteric fracture of right femur, subsequent encounter for closed fracture with routine healing: Secondary | ICD-10-CM | POA: Diagnosis not present

## 2019-06-28 NOTE — Progress Notes (Signed)
This service is provided via telemedicine  No vital signs collected/recorded due to the encounter was a telemedicine visit.   Location of patient (ex: home, work): Home  Patient consents to a telephone visit:  Yes  Location of the provider (ex: office, home):  Virginia [name], Virginia Mustache, NP, and Patient   Name of any referring provider:  N/A  Names of all persons participating in the telemedicine service and their role in the encounter:  Virginia [name], Virginia Mustache, NP, Virginia [name], and Patient   Time spent on call:  11 min with medical assistant      Careteam: Patient Care Team: Virginia Chandler, NP as PCP - General (Nurse Practitioner) Virginia Records, MD as PCP - Cardiology (Cardiology) Virginia Rockers, MD as Consulting Physician (Pulmonary Disease) Virginia Bowens, RN as Registered Nurse (Hospice and Palliative Medicine) Virginia Fredrickson Lajuan Lines, MD as Consulting Physician (Gastroenterology)  Advanced Directive information Does Patient Have a Medical Advance Directive?: Yes(Daughter will see a lawyer to get papers change), Type of Advance Directive: [place], Does patient want to make changes to medical advance directive?: Yes (MAU/Ambulatory/Procedural Areas - Information given)  Allergies  Allergen Reactions  . Lidocaine Anaphylaxis, Swelling, Rash and Other (See Comments)    Any of the " [facility] "  . Other Other (See Comments)    All drugs that end in "cane'-  novacane etc. Daughter states patient almost died when she had it when she was born    Chief Complaint  Patient presents with  . Acute Visit    Cough and congestion. High fall risk. Telephone/Facetime visit      HPI: Patient is a 83 y.o. female to follow up on cough and congestion Daughter providing information on phone call.   She reports Blood pressure 175/91 before medication 114/76 after HR has been better in the 80s Tremors has improved.   Cough and congestion  have improved but short of breath and states her chest felt differently when she went to bathroom and when she got up to go to the bathroom. This started today, over the past few days has not been doing much activity. Cough and congestion stated 4 days ago, started mucinex DM twice daily which has improved symptoms.  Daughter reports they have been giving her 2 vials in the nebulizer instead of 1 per Virginia [name]- he stated that was what was recommended for him so he was doing that for her. Since then she has not had any wheezing. O2 was 98% on 2L No increase in swelling.  No fever. 98.8 and the rest of temperature lower. Denies chest pains.   Daughter very upset with situation, apparently social services was called due to her and Virginia [name] (pts son) fighting and other issues. She is trying very hard to take the best care of her mother. States that Home health will only talk to pts son and pts brother A lot of strangers coming into the home.  Daughter having a lot of issues with home health from Virginia [name], pts brother and son (her brother).  Daughter is leaving today because of a lot of stress that has been going on and has 2 caretakers who are taking over.   Overall pts  Pain has significantly improved, using tylenol which has been effective. Not using hydrocodone unless in severe pain and only gives a half or a quarter.   Anxiety- no alprazolam for a long time. Zoloft 75 mg daily  Occasional  constipation and diarrhea but overall bowels are moving well.   Had redness but daughter put zinc cream which has been effective and no breakdown.   Review of Systems:  Review of Systems  Unable to perform ROS: Dementia  ROS provided by daughter  Past Medical History:  Diagnosis Date  . Anxiety 07/08/2017  . Arthritis   . Carotid artery disease ([place])    a. s/p L CEA. b. followed by [facility].  . Carotid stenosis 10/19/2012  . Chronic diastolic CHF (congestive heart failure) ([place])   . CKD (chronic kidney disease), stage  III ([place])   . Congestive heart disease ([place])   . COPD (chronic obstructive pulmonary disease) ([place])   . COPD GOLD IV 12/30/2014   PFTs 12/15/14 FEV1  0.57 (29%) ratio 54 p 17% resp to saba    . Cough 01/31/2016  . Dementia with behavioral disturbance ([place]) 07/08/2017  . Diverticulosis   . Dyspnea   . Essential hypertension 12/02/2014  . Fall at home Sept. 2013  . Family history of adverse reaction to anesthesia    SON IS SLOW TO WAKE   . Gastroesophageal reflux disease 07/08/2017  . Hyperlipidemia   . Hypertension   . Hyponatremia 12/02/2014  . Hypotension 02/01/2017  . Incontinent of urine   . Internal hemorrhoid   . Intertrochanteric fracture ([place]) 04/20/2019  . Memory disorder 10/24/2014  . Nausea and vomiting 11/30/2014  . Primary osteoarthritis involving multiple joints 07/08/2017  . Vitamin D deficiency 02/15/2015   Past Surgical History:  Procedure Laterality Date  . APPENDECTOMY    . BREAST REDUCTION SURGERY    . CAROTID ENDARTERECTOMY     left CEA  . CATARACT EXTRACTION Bilateral   . COLONOSCOPY  2015  . EYE SURGERY     cataracts removed, bilaterally   . INTRAMEDULLARY (IM) NAIL INTERTROCHANTERIC Right 04/21/2019   Procedure: INTRAMEDULLARY (IM) NAIL INTERTROCHANTRIC;  Surgeon: Rod Can, MD;  Location: [facility];  Service: Orthopedics;  Laterality: Right;  . skin cancer removal    . TONSILLECTOMY     Social History:   reports that she quit smoking about 2 months ago. Her smoking use included cigarettes. She has a 5.00 pack-year smoking history. She has never used smokeless tobacco. She reports current alcohol use. She reports that she does not use drugs.  Family History  Problem Relation Age of Onset  . Heart disease Father        Before age 52  . Hypertension Father   . Heart attack Father   . Cancer Mother 34       Brain  . Dementia Brother   . Cancer Maternal Aunt 61       breast cancer  . Diabetes Maternal Aunt   . High blood pressure Brother   . Hypothyroidism  Daughter   . High blood pressure Son   . High Cholesterol Son   . Diabetes Son   . COPD Son   . Obesity Son   . Heart failure Maternal Grandmother   . Diabetes Maternal Grandmother   . Stroke Neg Hx     Medications: Patient's Medications  New Prescriptions   No medications on file  Previous Medications   ACETAMINOPHEN (TYLENOL) 500 MG TABLET    Take 500 mg by mouth 3 (three) times daily as needed.   ALBUTEROL (PROVENTIL) (2.5 MG/3ML) 0.083% NEBULIZER SOLUTION    Take 6 mLs (5 mg total) by nebulization every 4 (four) hours as needed for wheezing or shortness of  breath.   ALPRAZOLAM (XANAX) 0.25 MG TABLET    0.5-1 tablet every 6 hours as needed severe anxiety, not to exceed 2 tablet in 24 hours   CARBINOXAMINE MALEATE 4 MG TABS    Take 1 tablet (4 mg total) by mouth every 8 (eight) hours as needed.   CHOLECALCIFEROL (D3 SUPER STRENGTH) 2000 UNITS CAPS    Take 1 capsule (2,000 Units total) by mouth daily.   DEXTROMETHORPHAN-GUAIFENESIN (MUCINEX DM) 30-600 MG TB12    TAKE 2 TABLETS BY MOUTH TWICE DAILY   DOCUSATE SODIUM (COLACE) 100 MG CAPSULE    Take 100 mg by mouth at bedtime.   DONEPEZIL (ARICEPT) 10 MG TABLET    TAKE 1 TABLET BY MOUTH EVERY DAY   FUROSEMIDE (LASIX) 20 MG TABLET    Take 1 tablet (20 mg total) by mouth daily.   HYDROCODONE-ACETAMINOPHEN (NORCO/VICODIN) 5-325 MG TABLET    Take 1-2 tablets by mouth every 6 (six) hours as needed for moderate pain.   IPRATROPIUM-ALBUTEROL (DUONEB) 0.5-2.5 (3) MG/3ML SOLN    inhale contents of 1 vial in nebulizer every 6 hours   MEMANTINE (NAMENDA) 10 MG TABLET    Take 1 tablet (10 mg total) by mouth 2 (two) times daily.   ONDANSETRON (ZOFRAN-ODT) 4 MG DISINTEGRATING TABLET    DISSOLVE 1 TABLET ON THE TONGUE IN THE MORNING   PANTOPRAZOLE (PROTONIX) 40 MG TABLET    Take 40 mg by mouth 2 (two) times daily.   PROPRANOLOL (INDERAL) 20 MG TABLET    TAKE 1 TABLET(20 MG) BY MOUTH TWICE DAILY   SERTRALINE (ZOLOFT) 50 MG TABLET    Take 1.5 tablets (75  mg total) by mouth daily.  Modified Medications   No medications on file  Discontinued Medications   DOCUSATE SODIUM (COLACE) 100 MG CAPSULE    Take 1 capsule (100 mg total) by mouth 2 (two) times daily as needed for mild constipation.    Physical Exam:  There were no vitals filed for this visit. There is no height or weight on file to calculate BMI. Wt Readings from Last 3 Encounters:  04/21/19 178 lb (80.7 kg)  12/14/18 190 lb (86.2 kg)  12/07/18 187 lb 1.9 oz (84.9 kg)      Labs reviewed: Basic Metabolic Panel: Recent Labs    08/30/18 1423  04/22/19 0112 04/25/19 1110 04/26/19 1045 05/25/19  NA 141   < > 142 142 144 141  K 4.8   < > 4.2 3.6 3.4* 4.1  CL 99   < > 107 104 105  --   CO2 27   < > 26 27 28   --   GLUCOSE 131*   < > 127* 117* 114*  --   BUN 20   < > 14 18 15 12   CREATININE 1.39*   < > 0.94 0.74 0.76 0.8  CALCIUM 8.7   < > 8.4* 8.5* 8.8*  --   TSH 0.422*  --   --   --   --  0.35*   < > = values in this interval not displayed.   Liver Function Tests: Recent Labs    08/30/18 1423  AST 19  ALT 13  ALKPHOS 67  BILITOT 0.8  PROT 5.8*  ALBUMIN 3.8   No results for input(s): LIPASE, AMYLASE in the last 8760 hours. No results for input(s): AMMONIA in the last 8760 hours. CBC: Recent Labs    04/20/19 0456 04/21/19 0747 04/22/19 0112 04/25/19 1110 05/25/19  WBC 10.7* 10.4  9.9 8.3 8.2  NEUTROABS 8.0*  --   --   --  6  HGB 13.6 12.3 10.0* 9.2* 12.0  HCT 43.2 40.0 31.7* 28.9* 38  MCV 94.3 96.2 94.3 93.2  --   PLT 234 185 191 230 283   Lipid Panel: No results for input(s): CHOL, HDL, LDLCALC, TRIG, CHOLHDL, LDLDIRECT in the last 8760 hours. TSH: Recent Labs    08/30/18 1423 05/25/19  TSH 0.422* 0.35*   A1C: Lab Results  Component Value Date   HGBA1C 5.2 12/10/2017     Assessment/Plan 1. Subclinical hyperthyroidism Continues on propranolol for symptom management   2. Tremor -improved on propronolol  3. Closed displaced  intertrochanteric fracture of right femur with routine healing, subsequent encounter Doing well, continues to work with PT and increasing mobility. Pain is controlled on tylenol and not needed hydrocodone-apap.   4. Essential hypertension Controlled, continues on propanolol  5. Anxiety -stable, has not needed alprazolam in a long time, continues on zoloft 75 mg daily   6. Chronic diastolic CHF (congestive heart failure) (HCC) No signs of fluid overload per daughter without edema or fluid retention.   7. URI with cough and congestion Noted cough and congestion for 4 days, started mucinex DM BID which has been beneficial, was using 2 vials of duonebs q 4 hours per pts son, recommended to cut back on this to 1 vail has prescribed, this could be contributing to the chest discomfort. To monitor this with shortness of breath and if symptoms worsening or do not improve to notify. Also to notify for increase edema, fever or worsening cough and congestion  8. Family conflict Family conflict between pts daughter and son. Daughter feels like she is doing everything to help take care of her mother. She is taking a break for a few days and home health has helped coordinate care.   Next appt: 07/19/2019 as scheduled, sooner if needed  Angely Dietz K. Harle Battiest  [facility] [phone]   Virtual Visit via Video Note  I connected with [name] on 06/28/19 at  3:45 PM EDT by a video enabled telemedicine application and verified that I am speaking with the correct person using two identifiers.  Location: Patient: home Provider: office   I discussed the limitations of evaluation and management by telemedicine and the availability of in person appointments. The patient expressed understanding and agreed to proceed.    I discussed the assessment and treatment plan with the patient. The patient was provided an opportunity to ask questions and all were answered. The patient  agreed with the plan and demonstrated an understanding of the instructions.   The patient was advised to call back or seek an in-person evaluation if the symptoms worsen or if the condition fails to improve as anticipated.  I provided 32 minutes of non-face-to-face time during this encounter.  Carlos American. Dewaine Oats, AGNP Avs printed and mailed.

## 2019-06-28 NOTE — Progress Notes (Signed)
COMMUNITY PALLIATIVE CARE RN NOTE  PATIENT NAME: Virginia [name] DOB: 20-Nov-1936 MRN: [id number]  PRIMARY CARE PROVIDER: Lauree Chandler, NP  RESPONSIBLE PARTY:  Acct ID - Guarantor Home Phone Work Phone Relationship Acct Type  [id number] CATHYANN, KILFOYLE* [phone]  Self P/F     [address], [place], [place] [address]   Due to the COVID-19 crisis, this virtual check-in visit was done via telephone from my office and it was initiated and consent by this patient and or family.  PLAN OF CARE and INTERVENTION:  1. ADVANCE CARE PLANNING/GOALS OF CARE: Goal is for patient to remain in her home. She is a Full Code. 2. PATIENT/CAREGIVER EDUCATION: Safe Mobility/Transfers, Respiratory Symptom management 3. DISEASE STATUS: Virtual check-in visit completed via telephone. Patient's pain is controlled from recent right hip fracture with routine Tylenol. Patient's daughter [name] is concerned that patient may have Pneumonia. She reports a persistent congestion and cough. They are using nebulizers routinely along with Mucinex BID to help. Patient is afebrile. She has a virtual appointment with her PCP tomorrow to address cough/congestion. Patient was recently discharged from home health services per daughter. She has hired caregivers through [employer] to assist patient with personal care. Patient is moving better since receiving PT. Dizziness and tremors have improved with addition of Propanolol. She requires assistance with bathing, dressing, and transfers. She is transported via wheelchair throughout the home. Her intake is variable. She remains intermittently confused and can become agitated at times. Continues on Zoloft daily and PRN Xanax. Will continue to monitor.  HISTORY OF PRESENT ILLNESS:  This is a 83 yo female who resides in her home. Her son and younger both live in the home with her. Daughter [name] in from [place] to assist with patient care. Palliative Care team continues to follow patient. Will  continue to visit patient monthly and PRN.   CODE STATUS: Full Code ADVANCED DIRECTIVES: Y MOST FORM: no PPS: 30%   (Duration of visit and documentation 45 minutes)   [name], RN BSN

## 2019-06-29 DIAGNOSIS — S72141D Displaced intertrochanteric fracture of right femur, subsequent encounter for closed fracture with routine healing: Secondary | ICD-10-CM | POA: Diagnosis not present

## 2019-06-29 DIAGNOSIS — I130 Hypertensive heart and chronic kidney disease with heart failure and stage 1 through stage 4 chronic kidney disease, or unspecified chronic kidney disease: Secondary | ICD-10-CM | POA: Diagnosis not present

## 2019-06-29 DIAGNOSIS — M545 Low back pain: Secondary | ICD-10-CM | POA: Diagnosis not present

## 2019-06-29 DIAGNOSIS — I5032 Chronic diastolic (congestive) heart failure: Secondary | ICD-10-CM | POA: Diagnosis not present

## 2019-06-29 DIAGNOSIS — N183 Chronic kidney disease, stage 3 (moderate): Secondary | ICD-10-CM | POA: Diagnosis not present

## 2019-06-29 DIAGNOSIS — F0391 Unspecified dementia with behavioral disturbance: Secondary | ICD-10-CM | POA: Diagnosis not present

## 2019-06-30 DIAGNOSIS — I130 Hypertensive heart and chronic kidney disease with heart failure and stage 1 through stage 4 chronic kidney disease, or unspecified chronic kidney disease: Secondary | ICD-10-CM | POA: Diagnosis not present

## 2019-06-30 DIAGNOSIS — N183 Chronic kidney disease, stage 3 (moderate): Secondary | ICD-10-CM | POA: Diagnosis not present

## 2019-06-30 DIAGNOSIS — M545 Low back pain: Secondary | ICD-10-CM | POA: Diagnosis not present

## 2019-06-30 DIAGNOSIS — S72141D Displaced intertrochanteric fracture of right femur, subsequent encounter for closed fracture with routine healing: Secondary | ICD-10-CM | POA: Diagnosis not present

## 2019-06-30 DIAGNOSIS — I5032 Chronic diastolic (congestive) heart failure: Secondary | ICD-10-CM | POA: Diagnosis not present

## 2019-06-30 DIAGNOSIS — F0391 Unspecified dementia with behavioral disturbance: Secondary | ICD-10-CM | POA: Diagnosis not present

## 2019-07-02 DIAGNOSIS — R41841 Cognitive communication deficit: Secondary | ICD-10-CM | POA: Diagnosis not present

## 2019-07-02 DIAGNOSIS — Z9181 History of falling: Secondary | ICD-10-CM | POA: Diagnosis not present

## 2019-07-02 DIAGNOSIS — Z85828 Personal history of other malignant neoplasm of skin: Secondary | ICD-10-CM | POA: Diagnosis not present

## 2019-07-02 DIAGNOSIS — N183 Chronic kidney disease, stage 3 (moderate): Secondary | ICD-10-CM | POA: Diagnosis not present

## 2019-07-02 DIAGNOSIS — I130 Hypertensive heart and chronic kidney disease with heart failure and stage 1 through stage 4 chronic kidney disease, or unspecified chronic kidney disease: Secondary | ICD-10-CM | POA: Diagnosis not present

## 2019-07-02 DIAGNOSIS — F0391 Unspecified dementia with behavioral disturbance: Secondary | ICD-10-CM | POA: Diagnosis not present

## 2019-07-02 DIAGNOSIS — S72141D Displaced intertrochanteric fracture of right femur, subsequent encounter for closed fracture with routine healing: Secondary | ICD-10-CM | POA: Diagnosis not present

## 2019-07-02 DIAGNOSIS — M545 Low back pain: Secondary | ICD-10-CM | POA: Diagnosis not present

## 2019-07-02 DIAGNOSIS — I5032 Chronic diastolic (congestive) heart failure: Secondary | ICD-10-CM | POA: Diagnosis not present

## 2019-07-02 DIAGNOSIS — M150 Primary generalized (osteo)arthritis: Secondary | ICD-10-CM | POA: Diagnosis not present

## 2019-07-07 DIAGNOSIS — M1711 Unilateral primary osteoarthritis, right knee: Secondary | ICD-10-CM | POA: Diagnosis not present

## 2019-07-07 DIAGNOSIS — M19011 Primary osteoarthritis, right shoulder: Secondary | ICD-10-CM | POA: Diagnosis not present

## 2019-07-08 ENCOUNTER — Other Ambulatory Visit: Payer: Self-pay

## 2019-07-08 ENCOUNTER — Other Ambulatory Visit: Payer: Medicare Other | Admitting: *Deleted

## 2019-07-08 ENCOUNTER — Other Ambulatory Visit: Payer: Self-pay | Admitting: Nurse Practitioner

## 2019-07-08 DIAGNOSIS — I5032 Chronic diastolic (congestive) heart failure: Secondary | ICD-10-CM | POA: Diagnosis not present

## 2019-07-08 DIAGNOSIS — N183 Chronic kidney disease, stage 3 (moderate): Secondary | ICD-10-CM | POA: Diagnosis not present

## 2019-07-08 DIAGNOSIS — Z515 Encounter for palliative care: Secondary | ICD-10-CM

## 2019-07-08 DIAGNOSIS — M545 Low back pain: Secondary | ICD-10-CM | POA: Diagnosis not present

## 2019-07-08 DIAGNOSIS — S72141D Displaced intertrochanteric fracture of right femur, subsequent encounter for closed fracture with routine healing: Secondary | ICD-10-CM | POA: Diagnosis not present

## 2019-07-08 DIAGNOSIS — F0391 Unspecified dementia with behavioral disturbance: Secondary | ICD-10-CM | POA: Diagnosis not present

## 2019-07-08 DIAGNOSIS — I130 Hypertensive heart and chronic kidney disease with heart failure and stage 1 through stage 4 chronic kidney disease, or unspecified chronic kidney disease: Secondary | ICD-10-CM | POA: Diagnosis not present

## 2019-07-11 NOTE — Progress Notes (Signed)
COMMUNITY PALLIATIVE CARE RN NOTE  PATIENT NAME: APRYL BRYMER DOB: 1936-11-24 MRN: [id number]  PRIMARY CARE PROVIDER: Lauree Chandler, NP  RESPONSIBLE PARTY:  Acct ID - Guarantor Home Phone Work Phone Relationship Acct Type  [id number] MARQUETTE, PIONTEK* [phone]  Self P/F     [address], [place], [place] [address]   Due to the COVID-19 crisis, this virtual check-in visit was done via telephone from my office and it was initiated and consent by this patient and or family.  PLAN OF CARE and INTERVENTION:  1. ADVANCE CARE PLANNING/GOALS OF CARE: Goal is for patient to remain in her home.  2. PATIENT/CAREGIVER EDUCATION: Symptom management, Safe Mobility/Transfers 3. DISEASE STATUS: Virtual check-in visit completed via telephone. Spoke with patient's daughter, [name], who provided patient history. Her R hip pain is well controlled with Tylenol 3 times per day. Patient continues with progressive confusion, however her demeanor is calmer and more relaxed. She used to be very combative with care. She is no longer requiring a Hoyer lift for transfers. She was cleared by PT today for 1 person transfers. She did require 2 person transfers. She has been able to ambulate with 1 person assist and a gait belt for about 28 feet. She has 2 more sessions of PT left. She received a cortisone injection in her right shoulder yesterday. She is now receiving gel injections in her R knee. She continues with intermittent cough and congestion. She is given a nebulizer treatment 2-3 times daily and Mucinex twice daily which is helpful. She remains afebrile. Will continue to monitor.  HISTORY OF PRESENT ILLNESS:  This is a 83 yo female who resides at home. She has a hired caregiver 12 hours daily to assist with personal care needs. Palliative care team continues to follow patient. Will continue to check-in/visit with patient monthly and PRN.  CODE STATUS: Full Code ADVANCED DIRECTIVES: Y MOST FORM: no PPS:  30%   (Duration of visit and documentation 60 minutes)   [name], RN BSN

## 2019-07-12 ENCOUNTER — Other Ambulatory Visit: Payer: Self-pay | Admitting: Neurology

## 2019-07-12 DIAGNOSIS — I130 Hypertensive heart and chronic kidney disease with heart failure and stage 1 through stage 4 chronic kidney disease, or unspecified chronic kidney disease: Secondary | ICD-10-CM | POA: Diagnosis not present

## 2019-07-12 DIAGNOSIS — S72141D Displaced intertrochanteric fracture of right femur, subsequent encounter for closed fracture with routine healing: Secondary | ICD-10-CM | POA: Diagnosis not present

## 2019-07-12 DIAGNOSIS — N183 Chronic kidney disease, stage 3 (moderate): Secondary | ICD-10-CM | POA: Diagnosis not present

## 2019-07-12 DIAGNOSIS — I5032 Chronic diastolic (congestive) heart failure: Secondary | ICD-10-CM | POA: Diagnosis not present

## 2019-07-12 DIAGNOSIS — F0391 Unspecified dementia with behavioral disturbance: Secondary | ICD-10-CM | POA: Diagnosis not present

## 2019-07-12 DIAGNOSIS — M545 Low back pain: Secondary | ICD-10-CM | POA: Diagnosis not present

## 2019-07-12 MED ORDER — MEMANTINE HCL 10 MG PO TABS: 10.0000 mg | ORAL_TABLET | Freq: Two times a day (BID) | ORAL | 0 refills | Status: DC

## 2019-07-13 DIAGNOSIS — F0391 Unspecified dementia with behavioral disturbance: Secondary | ICD-10-CM | POA: Diagnosis not present

## 2019-07-13 DIAGNOSIS — N183 Chronic kidney disease, stage 3 (moderate): Secondary | ICD-10-CM | POA: Diagnosis not present

## 2019-07-13 DIAGNOSIS — S72141D Displaced intertrochanteric fracture of right femur, subsequent encounter for closed fracture with routine healing: Secondary | ICD-10-CM | POA: Diagnosis not present

## 2019-07-13 DIAGNOSIS — M545 Low back pain: Secondary | ICD-10-CM | POA: Diagnosis not present

## 2019-07-13 DIAGNOSIS — I130 Hypertensive heart and chronic kidney disease with heart failure and stage 1 through stage 4 chronic kidney disease, or unspecified chronic kidney disease: Secondary | ICD-10-CM | POA: Diagnosis not present

## 2019-07-13 DIAGNOSIS — I5032 Chronic diastolic (congestive) heart failure: Secondary | ICD-10-CM | POA: Diagnosis not present

## 2019-07-14 DIAGNOSIS — M1711 Unilateral primary osteoarthritis, right knee: Secondary | ICD-10-CM | POA: Diagnosis not present

## 2019-07-15 ENCOUNTER — Telehealth: Payer: Self-pay | Admitting: *Deleted

## 2019-07-15 NOTE — Telephone Encounter (Signed)
Left a voicemail to follow up on patient condition and arrange an in-person home visit. Awaiting return call. Received a communication from patient's daughter, [name], who states that patient is sleeping most of the day and seems to be on a "downhill slide." She had to drive back to [place] yesterday so wanted me to contact [name] to make visit arrangements.

## 2019-07-19 ENCOUNTER — Other Ambulatory Visit: Payer: Self-pay

## 2019-07-19 ENCOUNTER — Other Ambulatory Visit: Payer: Self-pay | Admitting: Allergy & Immunology

## 2019-07-19 ENCOUNTER — Encounter: Payer: Medicare Other | Admitting: Nurse Practitioner

## 2019-07-20 ENCOUNTER — Telehealth (INDEPENDENT_AMBULATORY_CARE_PROVIDER_SITE_OTHER): Payer: Medicare Other | Admitting: Cardiology

## 2019-07-20 ENCOUNTER — Encounter: Payer: Self-pay | Admitting: Cardiology

## 2019-07-20 ENCOUNTER — Other Ambulatory Visit: Payer: Self-pay

## 2019-07-20 VITALS — BP 126/86 | HR 84 | Ht 62.0 in

## 2019-07-20 DIAGNOSIS — F0391 Unspecified dementia with behavioral disturbance: Secondary | ICD-10-CM

## 2019-07-20 DIAGNOSIS — J449 Chronic obstructive pulmonary disease, unspecified: Secondary | ICD-10-CM | POA: Diagnosis not present

## 2019-07-20 DIAGNOSIS — I130 Hypertensive heart and chronic kidney disease with heart failure and stage 1 through stage 4 chronic kidney disease, or unspecified chronic kidney disease: Secondary | ICD-10-CM | POA: Diagnosis not present

## 2019-07-20 DIAGNOSIS — N183 Chronic kidney disease, stage 3 unspecified: Secondary | ICD-10-CM

## 2019-07-20 DIAGNOSIS — I5032 Chronic diastolic (congestive) heart failure: Secondary | ICD-10-CM | POA: Diagnosis not present

## 2019-07-20 DIAGNOSIS — I10 Essential (primary) hypertension: Secondary | ICD-10-CM

## 2019-07-20 NOTE — Progress Notes (Signed)
Virtual Visit via Video Note   This visit type was conducted due to national recommendations for restrictions regarding the COVID-19 Pandemic (e.g. social distancing) in an effort to limit this patient's exposure and mitigate transmission in our community.  Due to her co-morbid illnesses, this patient is at least at moderate risk for complications without adequate follow up.  This format is felt to be most appropriate for this patient at this time.  All issues noted in this document were discussed and addressed.  A limited physical exam was performed with this format.  Please refer to the patient's chart for her consent to telehealth for [facility].   Date:  07/20/2019   ID:  Virginia [name], DOB 1936/08/26, MRN [id number]  Patient Location: Home Provider Location: Home  PCP:  Lauree Chandler, NP  Cardiologist:  Dorris Carnes, MD  Electrophysiologist:  None   Evaluation Performed:  Follow-Up Visit  Chief Complaint:  CAD  History of Present Illness:    Virginia [name] is a 83 y.o. female with medical history of chronic diastolic CHF, COPD Gold 4 on home oxygen, hypertension, hyperlipidemia, left carotid artery stenosis s/p left CEA, CKD stage III, dementia and anxiety. 2D echo 2016 LVEF 65 to 70% with grade 1 DD.  She is on Lasix 40 mg daily for lower extremity edema.  The patient had a fall in May with right hip fracture and she underwent surgical hip repair on 04/21/2019.  She is followed by palliative care and is residing in her home with hired caregiver 12 hours daily for personal care needs.  Her son and younger brother live with her.  Notes indicate that the patient has had progressive confusion and had had prior aggression but is improved on Zoloft daily and Xanax as needed.  The patient is being seen today by email through [email or web address] with the assistance of her brother.  Her brother reports that she has been recovering fairly well and is walking with a walker. She always walks with  assistance and is not independent at all. Home PT is working with her, now once a week. She feels that she is getting weaker over the past few days and is going to see the orthopedist.  Her brother says that she gets sore after her physical therapy sessions.  Through questioning it appears that she does not do any exercises in between her sessions.  Legs are not swollen at this time. No orthopnea or PND. She props feet up in bed and while seated.  She denies chest pain or pressure.  She is using oxygen around-the-clock.  Home BP's 120's-170's/80's-90's. All taken in the mornings before her meds.   The patient does not have symptoms concerning for COVID-19 infection (fever, chills, cough, or new shortness of breath).    Past Medical History:  Diagnosis Date  . Anxiety 07/08/2017  . Arthritis   . Carotid artery disease ([place])    a. s/p L CEA. b. followed by [facility].  . Carotid stenosis 10/19/2012  . Chronic diastolic CHF (congestive heart failure) ([place])   . CKD (chronic kidney disease), stage III ([place])   . Congestive heart disease ([place])   . COPD (chronic obstructive pulmonary disease) ([place])   . COPD GOLD IV 12/30/2014   PFTs 12/15/14 FEV1  0.57 (29%) ratio 54 p 17% resp to saba    . Cough 01/31/2016  . Dementia with behavioral disturbance ([place]) 07/08/2017  . Diverticulosis   . Dyspnea   . Essential hypertension 12/02/2014  .  Fall at home Sept. 2013  . Family history of adverse reaction to anesthesia    SON IS SLOW TO WAKE   . Gastroesophageal reflux disease 07/08/2017  . Hyperlipidemia   . Hypertension   . Hyponatremia 12/02/2014  . Hypotension 02/01/2017  . Incontinent of urine   . Internal hemorrhoid   . Intertrochanteric fracture ([place]) 04/20/2019  . Memory disorder 10/24/2014  . Nausea and vomiting 11/30/2014  . Primary osteoarthritis involving multiple joints 07/08/2017  . Vitamin D deficiency 02/15/2015   Past Surgical History:  Procedure Laterality Date  . APPENDECTOMY    . BREAST REDUCTION  SURGERY    . CAROTID ENDARTERECTOMY     left CEA  . CATARACT EXTRACTION Bilateral   . COLONOSCOPY  2015  . EYE SURGERY     cataracts removed, bilaterally   . INTRAMEDULLARY (IM) NAIL INTERTROCHANTERIC Right 04/21/2019   Procedure: INTRAMEDULLARY (IM) NAIL INTERTROCHANTRIC;  Surgeon: Rod Can, MD;  Location: [place];  Service: Orthopedics;  Laterality: Right;  . skin cancer removal    . TONSILLECTOMY       Current Meds  Medication Sig  . acetaminophen (TYLENOL) 500 MG tablet Take 500 mg by mouth 3 (three) times daily as needed.  . Carbinoxamine Maleate 4 MG TABS Take 1 tablet (4 mg total) by mouth every 8 (eight) hours as needed.  . Cholecalciferol (D3 SUPER STRENGTH) 2000 units CAPS Take 1 capsule (2,000 Units total) by mouth daily.  Marland Kitchen Dextromethorphan-guaiFENesin (MUCINEX DM) 30-600 MG TB12 TAKE 2 TABLETS BY MOUTH TWICE DAILY  . docusate sodium (COLACE) 100 MG capsule Take 100 mg by mouth at bedtime.  . donepezil (ARICEPT) 10 MG tablet TAKE 1 TABLET BY MOUTH EVERY DAY  . furosemide (LASIX) 20 MG tablet Take 1 tablet (20 mg total) by mouth daily.  Marland Kitchen ipratropium-albuterol (DUONEB) 0.5-2.5 (3) MG/3ML SOLN inhale contents of 1 vial in nebulizer every 6 hours  . memantine (NAMENDA) 10 MG tablet Take 1 tablet (10 mg total) by mouth 2 (two) times daily.  . ondansetron (ZOFRAN-ODT) 4 MG disintegrating tablet DISSOLVE 1 TABLET ON THE TONGUE IN THE MORNING (Patient taking differently: Take 4 mg by mouth every 8 (eight) hours as needed. DISSOLVE 1 TABLET ON THE TONGUE IN THE MORNING)  . pantoprazole (PROTONIX) 40 MG tablet Take 1 tablet (40 mg total) by mouth 2 (two) times daily.  . propranolol (INDERAL) 20 MG tablet TAKE 1 TABLET(20 MG) BY MOUTH TWICE DAILY  . sertraline (ZOLOFT) 50 MG tablet Take 1.5 tablets (75 mg total) by mouth daily.     Allergies:   Lidocaine and Other   Social History   Tobacco Use  . Smoking status: Former Smoker    Packs/day: 0.25    Years: 20.00    Pack  years: 5.00    Types: Cigarettes    Quit date: 04/2019    Years since quitting: 0.3  . Smokeless tobacco: Never Used  . Tobacco comment: 4 or 5 cigarettes per day average  Substance Use Topics  . Alcohol use: Yes    Alcohol/week: 0.0 standard drinks    Comment: Rare glass of wine  . Drug use: No     Family Hx: The patient's family history includes COPD in her son; Cancer (age of onset: 6) in her mother; Cancer (age of onset: 86) in her maternal aunt; Dementia in her brother; Diabetes in her maternal aunt, maternal grandmother, and son; Heart attack in her father; Heart disease in her father; Heart failure in  her maternal grandmother; High Cholesterol in her son; High blood pressure in her brother and son; Hypertension in her father; Hypothyroidism in her daughter; Obesity in her son. There is no history of Stroke.  ROS:   Please see the history of present illness.     All other systems reviewed and are negative.   Prior CV studies:   The following studies were reviewed today:  2D echo 12/07/2014 Study Conclusions - Left ventricle: The cavity size was normal. Systolic function was vigorous. The estimated ejection fraction was in the range of 65% to 70%. Wall motion was normal; there were no regional wall motion abnormalities. Doppler parameters are consistent with abnormal left ventricular relaxation (grade 1 diastolic dysfunction). - Mitral valve: Calcified annulus.   Labs/Other Tests and Data Reviewed:    EKG:  No ECG reviewed.  Recent Labs: 08/30/2018: ALT 13 09/27/2018: NT-Pro BNP 503 05/25/2019: BUN 12; Creatinine 0.8; Hemoglobin 12.0; Platelets 283; Potassium 4.1; Sodium 141; TSH 0.35   Recent Lipid Panel Lab Results  Component Value Date/Time   CHOL 189 06/21/2018 12:00 AM   TRIG 140 06/21/2018 12:00 AM   HDL 58 06/21/2018 12:00 AM   CHOLHDL 3.3 06/21/2018 12:00 AM   CHOLHDL 2.9 12/10/2017 01:49 PM   LDLCALC 103 (H) 06/21/2018 12:00 AM   LDLCALC 111  (H) 12/10/2017 01:49 PM   LDLDIRECT 142.0 05/24/2015 11:08 AM    Wt Readings from Last 3 Encounters:  04/21/19 178 lb (80.7 kg)  12/14/18 190 lb (86.2 kg)  12/07/18 187 lb 1.9 oz (84.9 kg)     Objective:    Vital Signs:  BP 126/86   Pulse 84   Ht 5\' 2"  (1.575 m)   LMP  (LMP Unknown)   BMI 32.56 kg/m    VITAL SIGNS:  reviewed GEN:  no acute distress RESPIRATORY:  normal respiratory effort, symmetric expansion CARDIOVASCULAR:  no peripheral edema SKIN:  Skin appears pink and well perfused, face and hands. NEURO:  alert and oriented x 3, no obvious focal deficit PSYCH:  normal affect  Mild memory deficits  ASSESSMENT & PLAN:    Chronic diastolic CHF -Last echo in 2016 with LVEF 65-70%, grade 1 DD. -Patient on Lasix 20 mg for lower extremity edema.  2 g sodium diet. -Currently no edema as reported by patient and her brother. -Continue current therapy  Essential hypertension -On propranolol and Lasix (previously on Bystolic, but now switched to propranolol for tremors by review of notes).  By hospital notes from May the patient was on amlodipine but not currently.  Unclear why.  Per PCP notes, the patient's blood pressure is high prior to medication but well controlled after medication. -Stable renal function and potassium by labs in 05/2019 -Blood pressure runs 120s-170s over 80s-90s per home report, they always take her blood pressure in the morning prior to her medication. -Discussed with brother that they will monitor her blood pressure at different times during the day, after medication and notify us if systolic BP runs consistently over 140.  Can add back a small dose of amlodipine if needed.  CKD stage III -Followed by Dr. Lorrene Reid. -Serum creatinine 0.8 05/25/2019  COPD Gold IV -On home oxygen  Dementia with memory loss -Lives with her brother and son. -Has palliative care home visits and in-home 12-hour care for assistance with ADLs. -Patient is reliant on others  for all of her care.   COVID-19 Education: The signs and symptoms of COVID-19 were discussed with the patient and how  to seek care for testing (follow up with PCP or arrange E-visit).  The importance of social distancing was discussed today.  Time:   Today, I have spent 18 minutes with the patient with telehealth technology discussing the above problems.     Medication Adjustments/Labs and Tests Ordered: Current medicines are reviewed at length with the patient today.  Concerns regarding medicines are outlined above.   Tests Ordered: No orders of the defined types were placed in this encounter.   Medication Changes: No orders of the defined types were placed in this encounter.   Follow Up:  Virtual Visit or In Person in 6 month(s)  Signed, Daune Perch, NP  07/20/2019 3:01 PM    [facility]

## 2019-07-20 NOTE — Progress Notes (Signed)
This encounter was created in error - please disregard.

## 2019-07-20 NOTE — Patient Instructions (Addendum)
Medication Instructions:  Your physician recommends that you continue on your current medications as directed. Please refer to the Current Medication list given to you today.  If you need a refill on your cardiac medications before your next appointment, please call your pharmacy.   Lab work: None   If you have labs (blood work) drawn today and your tests are completely normal, you will receive your results only by: Marland Kitchen MyChart Message (if you have MyChart) OR . A paper copy in the mail If you have any lab test that is abnormal or we need to change your treatment, we will call you to review the results.  Testing/Procedures: None   Follow-Up: At [facility], you and your health needs are our priority.  As part of our continuing mission to provide you with exceptional heart care, we have created designated Provider Care Teams.  These Care Teams include your primary Cardiologist (physician) and Advanced Practice Providers (APPs -  Physician Assistants and Nurse Practitioners) who all work together to provide you with the care you need, when you need it. You will need a follow up appointment in:  6 months.  Please call our office 2 months in advance to schedule this appointment.  You may see Dorris Carnes, MD or one of the following Advanced Practice Providers on your designated Care Team: Richardson Dopp, PA-C [place], [place] . Daune Perch, NP  Any Other Special Instructions Will Be Listed Below (If Applicable).   Check blood pressures at different times of the day.  It will often be lower after medication. Notify our office if blood pressures running consistently over 140 on the top. Try to follow a low-sodium diet.

## 2019-07-21 ENCOUNTER — Encounter: Payer: Self-pay | Admitting: Allergy & Immunology

## 2019-07-21 ENCOUNTER — Ambulatory Visit (INDEPENDENT_AMBULATORY_CARE_PROVIDER_SITE_OTHER): Payer: Medicare Other | Admitting: Allergy & Immunology

## 2019-07-21 DIAGNOSIS — J310 Chronic rhinitis: Secondary | ICD-10-CM

## 2019-07-21 DIAGNOSIS — J441 Chronic obstructive pulmonary disease with (acute) exacerbation: Secondary | ICD-10-CM

## 2019-07-21 MED ORDER — CARBINOXAMINE MALEATE 4 MG PO TABS: 4.0000 mg | ORAL_TABLET | Freq: Three times a day (TID) | ORAL | 5 refills | Status: DC | PRN

## 2019-07-21 MED ORDER — PREDNISONE 10 MG PO TABS: ORAL_TABLET | ORAL | 0 refills | Status: DC

## 2019-07-21 NOTE — Patient Instructions (Addendum)
1. Chronic obstructive pulmonary disease with acute exacerbation - We will continue with the Breo since this seems to be going well.  - We are going to send in a prednisone burst at a low dose to see if this helps with her chest congestion. - If she is not doing better in a couple of days, I would definitely follow-up with cardiology to see if her Lasix needs to be changed. - I did explain to her son that chest congestion and wheezing can be a symptom of congestive heart failure flares as well. - Daily controller medication(s): Breo 100/25 one puff once daily - Rescue medications: albuterol 4 puffs every 4-6 hours as needed, albuterol nebulizer one vial puffs every 4-6 hours as needed or DuoNeb nebulizer one vial every 4-6 hours as needed - Asthma control goals:  * Full participation in all desired activities (may need albuterol before activity) * Albuterol use two time or less a week on average (not counting use with activity) * Cough interfering with sleep two time or less a month * Oral steroids no more than once a year * No hospitalizations  2. Non-allergic rhinitis - Continue Xyzal (levocetirizine) 5mg  tablet once daily and Flonase (fluticasons) one spray per nostril daily and carbinoxamine 4 mg every 8 hours as needed. - You can use an extra dose of the antihistamine, if needed, for breakthrough symptoms.  - Consider nasal saline rinses 1-2 times daily to remove allergens from the nasal cavities as well as help with mucous clearance (this is especially helpful to do before the nasal sprays are given).  3. Return in about 1 year (around 07/20/2020).   Please inform us of any Emergency Department visits, hospitalizations, or changes in symptoms. Call us before going to the ED for breathing or allergy symptoms since we might be able to fit you in for a sick visit. Feel free to contact us anytime with any questions, problems, or concerns.  It was a pleasure to see you and your family again  today!   Websites that have reliable patient information: 1. American Academy of Asthma, Allergy, and Immunology: www.aaaai.org 2. Food Allergy Research and Education (FARE): foodallergy.org 3. Mothers of Asthmatics: http://www.asthmacommunitynetwork.org 4. American College of Allergy, Asthma, and Immunology: www.acaai.org

## 2019-07-21 NOTE — Progress Notes (Signed)
RE: Virginia [name] MRN: [id number] DOB: August 28, 1936 Date of Telemedicine Visit: 07/21/2019  Referring provider: Lauree Chandler, NP Primary care provider: Lauree Chandler, NP  Chief Complaint: COPD (using nebs every 6 hours, still has some rattling in her chest and coughing some, is asking for prednisone)   Telemedicine Follow Up Visit via Telephone: I connected with Virginia [name] for a follow up on 07/21/19 by telephone and verified that I am speaking with the correct person using two identifiers.   I discussed the limitations, risks, security and privacy concerns of performing an evaluation and management service by telephone and the availability of in person appointments. I also discussed with the patient that there may be a patient responsible charge related to this service. The patient expressed understanding and agreed to proceed.  Patient is at home accompanied by her son who provided/contributed to the history.  Provider is at the office.  Visit start time: 4:43 PM Visit end time: 4:55 PM Insurance consent/check in by: [facility] consent and medical assistant/nurse: Trina  History of Present Illness:  She is a 83 y.o. female, who is being followed for COPD and nonallergic rhinitis. Her previous allergy office visit was in May 2019 with myself.  At that visit, we continued Breo 100 mcg / 25 mcg 1 puff once daily in combination with albuterol nebs or duo nebs as needed.  For his nonallergic rhinitis, we continued with levo cetirizine and Flonase as well as carbinoxamine as needed.  Since the last visit, she has mostly done well. History obtained from the patient's son. He does request a refill of the carbinoxamine. He also tells me that she has been having a "rattling" in her chest. Son is requesting some prednisone. She has not had fever at all. There is no one with COVID in her household of amongst her caregiver staff.   Asthma/Respiratory Symptom History: She remains on the  Breo one puff once daily. She does have DuoNeb to use as needed.  She has been using the DuoNeb with continued rattling in her chest.  She did have a CHF appointment yesterday, but they did not bring up the chest following to her cardiologist.  She remains on Lasix which has not been changed in quite some time.  Allergic Rhinitis Symptom History: The carbinoxamine seems to be working well for her.  She has not needed any antibiotics at all.  She has a Flonase to use as needed.  Otherwise, there have been no changes to her past medical history, surgical history, family history, or social history.  Assessment and Plan:  Virginia [name] is a 83 y.o. female with:  Chronic obstructive pulmonary disease  Non-allergic rhinitis  Complicated past medical history including dementia as well as congestive heart failure   1. Chronic obstructive pulmonary disease with acute exacerbation - We will continue with the Breo since this seems to be going well.  - We are going to send in a prednisone burst at a low dose to see if this helps with her chest congestion. - If she is not doing better in a couple of days, I would definitely follow-up with cardiology to see if her Lasix needs to be changed. - I did explain to her son that chest congestion and wheezing can be a symptom of congestive heart failure flares as well. - Daily controller medication(s): Breo 100/25 one puff once daily - Rescue medications: albuterol 4 puffs every 4-6 hours as needed, albuterol nebulizer one vial puffs every 4-6  hours as needed or DuoNeb nebulizer one vial every 4-6 hours as needed - Asthma control goals:  * Full participation in all desired activities (may need albuterol before activity) * Albuterol use two time or less a week on average (not counting use with activity) * Cough interfering with sleep two time or less a month * Oral steroids no more than once a year * No hospitalizations  2. Non-allergic rhinitis - Continue Xyzal  (levocetirizine) 5mg  tablet once daily and Flonase (fluticasons) one spray per nostril daily and carbinoxamine 4 mg every 8 hours as needed. - You can use an extra dose of the antihistamine, if needed, for breakthrough symptoms.  - Consider nasal saline rinses 1-2 times daily to remove allergens from the nasal cavities as well as help with mucous clearance (this is especially helpful to do before the nasal sprays are given).  3. Follow-up in 1 year or earlier if needed.   Diagnostics: None.  Medication List:  Current Outpatient Medications  Medication Sig Dispense Refill  . acetaminophen (TYLENOL) 500 MG tablet Take 500 mg by mouth 3 (three) times daily as needed.    . Carbinoxamine Maleate 4 MG TABS Take 1 tablet (4 mg total) by mouth every 8 (eight) hours as needed. 56 tablet 5  . Cholecalciferol (D3 SUPER STRENGTH) 2000 units CAPS Take 1 capsule (2,000 Units total) by mouth daily. 30 each 3  . Dextromethorphan-guaiFENesin (MUCINEX DM) 30-600 MG TB12 TAKE 2 TABLETS BY MOUTH TWICE DAILY 56 tablet 3  . docusate sodium (COLACE) 100 MG capsule Take 100 mg by mouth at bedtime.    . donepezil (ARICEPT) 10 MG tablet TAKE 1 TABLET BY MOUTH EVERY DAY 30 tablet 0  . furosemide (LASIX) 20 MG tablet Take 1 tablet (20 mg total) by mouth daily. 90 tablet 3  . ipratropium-albuterol (DUONEB) 0.5-2.5 (3) MG/3ML SOLN inhale contents of 1 vial in nebulizer every 6 hours 360 mL 12  . memantine (NAMENDA) 10 MG tablet Take 1 tablet (10 mg total) by mouth 2 (two) times daily. 60 tablet 0  . ondansetron (ZOFRAN-ODT) 4 MG disintegrating tablet DISSOLVE 1 TABLET ON THE TONGUE IN THE MORNING (Patient taking differently: Take 4 mg by mouth every 8 (eight) hours as needed. DISSOLVE 1 TABLET ON THE TONGUE IN THE MORNING) 30 tablet 0  . pantoprazole (PROTONIX) 40 MG tablet Take 1 tablet (40 mg total) by mouth 2 (two) times daily. 180 tablet 1  . propranolol (INDERAL) 20 MG tablet TAKE 1 TABLET(20 MG) BY MOUTH TWICE DAILY  180 tablet 1  . sertraline (ZOLOFT) 50 MG tablet Take 1.5 tablets (75 mg total) by mouth daily.    . predniSONE (DELTASONE) 10 MG tablet Take two tablets (20mg ) twice daily for three days, then one tablet (10mg ) twice daily for three days, then STOP 18 tablet 0   No current facility-administered medications for this visit.    Allergies: Allergies  Allergen Reactions  . Lidocaine Anaphylaxis, Swelling, Rash and Other (See Comments)    Any of the " [facility] "  . Other Other (See Comments)    All drugs that end in "cane'-  novacane etc. Daughter states patient almost died when she had it when she was born   I reviewed her past medical history, social history, family history, and environmental history and no significant changes have been reported from previous visits.  Review of Systems  Constitutional: Negative for activity change, appetite change, chills, fatigue and fever.  HENT: Negative for  congestion, drooling, postnasal drip, rhinorrhea, sinus pressure and sore throat.   Eyes: Negative for pain, discharge, redness and itching.  Respiratory: Positive for cough and shortness of breath. Negative for wheezing and stridor.   Gastrointestinal: Negative for diarrhea, nausea and vomiting.  Endocrine: Negative for cold intolerance and heat intolerance.  Musculoskeletal: Negative for arthralgias, joint swelling and myalgias.  Skin: Negative for rash.  Allergic/Immunologic: Negative for environmental allergies and food allergies.    Objective:  Physical exam not obtained as encounter was done via telephone.   Previous notes and tests were reviewed.  I discussed the assessment and treatment plan with the patient. The patient was provided an opportunity to ask questions and all were answered. The patient agreed with the plan and demonstrated an understanding of the instructions.   The patient was advised to call back or seek an in-person evaluation if the symptoms worsen or if the  condition fails to improve as anticipated.  I provided 12 minutes of non-face-to-face time during this encounter.  It was my pleasure to participate in [place] care today. Please feel free to contact me with any questions or concerns.   Sincerely,  Valentina Shaggy, MD

## 2019-07-22 ENCOUNTER — Telehealth: Payer: Self-pay | Admitting: *Deleted

## 2019-07-22 NOTE — Telephone Encounter (Signed)
Insurance will not cover Carbinoxamine. PA submitted.

## 2019-07-25 DIAGNOSIS — M1711 Unilateral primary osteoarthritis, right knee: Secondary | ICD-10-CM | POA: Diagnosis not present

## 2019-07-25 NOTE — Telephone Encounter (Signed)
Approval faxed to pharmacy

## 2019-07-25 NOTE — Telephone Encounter (Addendum)
Fax from [facility] has been approved: Approval dates:  04/23/19 - 07/21/2020. Form has been sent to MR for scanning

## 2019-07-27 ENCOUNTER — Ambulatory Visit (INDEPENDENT_AMBULATORY_CARE_PROVIDER_SITE_OTHER): Payer: Medicare Other | Admitting: Nurse Practitioner

## 2019-07-27 ENCOUNTER — Encounter: Payer: Self-pay | Admitting: Nurse Practitioner

## 2019-07-27 ENCOUNTER — Other Ambulatory Visit: Payer: Self-pay

## 2019-07-27 DIAGNOSIS — S72141D Displaced intertrochanteric fracture of right femur, subsequent encounter for closed fracture with routine healing: Secondary | ICD-10-CM | POA: Diagnosis not present

## 2019-07-27 DIAGNOSIS — M150 Primary generalized (osteo)arthritis: Secondary | ICD-10-CM | POA: Diagnosis not present

## 2019-07-27 DIAGNOSIS — J441 Chronic obstructive pulmonary disease with (acute) exacerbation: Secondary | ICD-10-CM | POA: Diagnosis not present

## 2019-07-27 DIAGNOSIS — I130 Hypertensive heart and chronic kidney disease with heart failure and stage 1 through stage 4 chronic kidney disease, or unspecified chronic kidney disease: Secondary | ICD-10-CM | POA: Diagnosis not present

## 2019-07-27 DIAGNOSIS — M545 Low back pain: Secondary | ICD-10-CM | POA: Diagnosis not present

## 2019-07-27 DIAGNOSIS — I5032 Chronic diastolic (congestive) heart failure: Secondary | ICD-10-CM | POA: Diagnosis not present

## 2019-07-27 DIAGNOSIS — R5381 Other malaise: Secondary | ICD-10-CM | POA: Diagnosis not present

## 2019-07-27 DIAGNOSIS — M159 Polyosteoarthritis, unspecified: Secondary | ICD-10-CM

## 2019-07-27 DIAGNOSIS — S81811A Laceration without foreign body, right lower leg, initial encounter: Secondary | ICD-10-CM | POA: Diagnosis not present

## 2019-07-27 DIAGNOSIS — F0391 Unspecified dementia with behavioral disturbance: Secondary | ICD-10-CM | POA: Diagnosis not present

## 2019-07-27 DIAGNOSIS — N183 Chronic kidney disease, stage 3 (moderate): Secondary | ICD-10-CM | POA: Diagnosis not present

## 2019-07-27 NOTE — Progress Notes (Signed)
This service is provided via telemedicine  No vital signs collected/recorded due to the encounter was a telemedicine visit.   Location of patient (ex: home, work):  Home  Patient consents to a telephone visit: Yes  Location of the provider (ex: office, home): [facility], Office   Name of any referring provider: N/A  Names of all persons participating in the telemedicine service and their role in the encounter:  S.Chrae B/CMA, Sherrie Mustache, NP, [name] (patients brother) and Patient   Time spent on call:  6 min with medical assistant      Careteam: Patient Care Team: Lauree Chandler, NP as PCP - General (Nurse Practitioner) Fay Records, MD as PCP - Cardiology (Cardiology) Tanda Rockers, MD as Consulting Physician (Pulmonary Disease) Conan Bowens, RN as Registered Nurse (Hospice and Palliative Medicine) Hilarie Fredrickson, Lajuan Lines, MD as Consulting Physician (Gastroenterology)  Advanced Directive information    Allergies  Allergen Reactions   Lidocaine Anaphylaxis, Swelling, Rash and Other (See Comments)    Any of the " [facility] "   Other Other (See Comments)    All drugs that end in "cane'-  novacane etc. Daughter states patient almost died when she had it when she was born    Chief Complaint  Patient presents with   Follow-up    Family request, patient with severe right shoulder pain, right arm swelling and dragging her right leg. PT vistied with patient and he noticed a drastic changed in mobility from previous visit. Skin tear, evaluated by EMS last night. Telephone visit    Immunizations    Patient will get flu vaccine at pharmacy      HPI: Patient is a 83 y.o. female for video visit.   Son and brother on the call- reports she is having progressive weakness; she has severe OA to left knee and has had a series of injections, last was 2 days ago and this one seems to be doing better.  After 2nd injection was having increase pain to leg and  mobility declined. She was hesitant to move. Previously she was walking with her walker, now not walking at all and having difficult transferring.  Following this she had a COPD exacerbation and stayed in the bed more than normal. Also around 10 days ago family noted right arm is swollen and had some bruising. thought there was an episode (fall or almost fall) with one of the caregiver that was not reported. Pt has hx of anxiety and can be resistant to care (which could have led to an incident). There was no fall or injury reported. Son or brother did not witness any events.   In the last few days mobility, swelling and brusing has improved slightly but still having difficulty with movement. Had PT today. Recommendations by PT are transfer SNF due to care required with mobility and ADLs. She has home care aides that do help her with bathing, toileting, dressing and transferring at this time.  Went to orthopedic (for final knee injection) and he looked at her arm and leg. Thought arthritis had been exacerbated but did not feel like she needed more imaging. Moving all joints and not as painful. Has given tylenol which worked well prior to 10 days.   Last night she had a skin tear with transfer in the bathroom. EMS was called for evaluation and gave son instructions for care. Nurse came to the house today for evaluation and meet with PT and thinks  they nursing care to change the dressing. Son is able to change tonight based on EMS instructions. Happened on right lower leg, shin. EMS recommended nonstick gauze. Neosporin was applied last night.   States they have in home caregivers [facility] and another personal aide and therapy coming out through [facility].   Appetite and oral intake was down but now improving.   COPD with exacerbation overall improving with prednisone and nebulizer treatments, felt like this was a result of staying in the bed too much. Overall without increase in shortness of breath,  cough or congestion.  120/64 HR91 Review of Systems:  Review of Systems  Unable to perform ROS: Dementia    Past Medical History:  Diagnosis Date   Anxiety 07/08/2017   Arthritis    Carotid artery disease ([place])    a. s/p L CEA. b. followed by [facility].   Carotid stenosis 10/19/2012   Chronic diastolic CHF (congestive heart failure) ([place])    CKD (chronic kidney disease), stage III (HCC)    Congestive heart disease ([place])    COPD (chronic obstructive pulmonary disease) ([place])    COPD GOLD IV 12/30/2014   PFTs 12/15/14 FEV1  0.57 (29%) ratio 54 p 17% resp to saba     Cough 01/31/2016   Dementia with behavioral disturbance ([place]) 07/08/2017   Diverticulosis    Dyspnea    Essential hypertension 12/02/2014   Fall at home Sept. 2013   Family history of adverse reaction to anesthesia    SON IS SLOW TO WAKE    Gastroesophageal reflux disease 07/08/2017   Hyperlipidemia    Hypertension    Hyponatremia 12/02/2014   Hypotension 02/01/2017   Incontinent of urine    Internal hemorrhoid    Intertrochanteric fracture ([place]) 04/20/2019   Memory disorder 10/24/2014   Nausea and vomiting 11/30/2014   Primary osteoarthritis involving multiple joints 07/08/2017   Vitamin D deficiency 02/15/2015   Past Surgical History:  Procedure Laterality Date   APPENDECTOMY     BREAST REDUCTION SURGERY     CAROTID ENDARTERECTOMY     left CEA   CATARACT EXTRACTION Bilateral    COLONOSCOPY  2015   EYE SURGERY     cataracts removed, bilaterally    INTRAMEDULLARY (IM) NAIL INTERTROCHANTERIC Right 04/21/2019   Procedure: INTRAMEDULLARY (IM) NAIL INTERTROCHANTRIC;  Surgeon: Rod Can, MD;  Location: [place];  Service: Orthopedics;  Laterality: Right;   skin cancer removal     TONSILLECTOMY     Social History:   reports that she quit smoking about 3 months ago. Her smoking use included cigarettes. She has a 5.00 pack-year smoking history. She has never used smokeless tobacco. She reports  current alcohol use. She reports that she does not use drugs.  Family History  Problem Relation Age of Onset   Heart disease Father        Before age 38   Hypertension Father    Heart attack Father    Cancer Mother 52       Brain   Dementia Brother    Cancer Maternal Aunt 61       breast cancer   Diabetes Maternal Aunt    High blood pressure Brother    Hypothyroidism Daughter    High blood pressure Son    High Cholesterol Son    Diabetes Son    COPD Son    Obesity Son    Heart failure Maternal Grandmother    Diabetes Maternal Grandmother    Stroke Neg Hx  Medications: Patient's Medications  New Prescriptions   No medications on file  Previous Medications   ACETAMINOPHEN (TYLENOL) 500 MG TABLET    Take 500 mg by mouth 3 (three) times daily as needed.   CARBINOXAMINE MALEATE 4 MG TABS    Take 1 tablet (4 mg total) by mouth every 8 (eight) hours as needed.   CHOLECALCIFEROL (D3 SUPER STRENGTH) 2000 UNITS CAPS    Take 1 capsule (2,000 Units total) by mouth daily.   DEXTROMETHORPHAN-GUAIFENESIN (MUCINEX DM) 30-600 MG TB12    TAKE 2 TABLETS BY MOUTH TWICE DAILY   DOCUSATE SODIUM (COLACE) 100 MG CAPSULE    Take 100 mg by mouth at bedtime.   DONEPEZIL (ARICEPT) 10 MG TABLET    TAKE 1 TABLET BY MOUTH EVERY DAY   FUROSEMIDE (LASIX) 20 MG TABLET    Take 1 tablet (20 mg total) by mouth daily.   IPRATROPIUM-ALBUTEROL (DUONEB) 0.5-2.5 (3) MG/3ML SOLN    inhale contents of 1 vial in nebulizer every 6 hours   MEMANTINE (NAMENDA) 10 MG TABLET    Take 1 tablet (10 mg total) by mouth 2 (two) times daily.   ONDANSETRON (ZOFRAN-ODT) 4 MG DISINTEGRATING TABLET    DISSOLVE 1 TABLET ON THE TONGUE IN THE MORNING   PANTOPRAZOLE (PROTONIX) 40 MG TABLET    Take 1 tablet (40 mg total) by mouth 2 (two) times daily.   PREDNISONE (DELTASONE) 10 MG TABLET    Take two tablets (20mg ) twice daily for three days, then one tablet (10mg ) twice daily for three days, then STOP   PROPRANOLOL  (INDERAL) 20 MG TABLET    TAKE 1 TABLET(20 MG) BY MOUTH TWICE DAILY   SERTRALINE (ZOLOFT) 50 MG TABLET    Take 1.5 tablets (75 mg total) by mouth daily.  Modified Medications   No medications on file  Discontinued Medications   No medications on file    Physical Exam:  There were no vitals filed for this visit. There is no height or weight on file to calculate BMI. Wt Readings from Last 3 Encounters:  04/21/19 178 lb (80.7 kg)  12/14/18 190 lb (86.2 kg)  12/07/18 187 lb 1.9 oz (84.9 kg)   Labs reviewed: Basic Metabolic Panel: Recent Labs    08/30/18 1423  04/22/19 0112 04/25/19 1110 04/26/19 1045 05/25/19  NA 141   < > 142 142 144 141  K 4.8   < > 4.2 3.6 3.4* 4.1  CL 99   < > 107 104 105  --   CO2 27   < > 26 27 28   --   GLUCOSE 131*   < > 127* 117* 114*  --   BUN 20   < > 14 18 15 12   CREATININE 1.39*   < > 0.94 0.74 0.76 0.8  CALCIUM 8.7   < > 8.4* 8.5* 8.8*  --   TSH 0.422*  --   --   --   --  0.35*   < > = values in this interval not displayed.   Liver Function Tests: Recent Labs    08/30/18 1423  AST 19  ALT 13  ALKPHOS 67  BILITOT 0.8  PROT 5.8*  ALBUMIN 3.8   No results for input(s): LIPASE, AMYLASE in the last 8760 hours. No results for input(s): AMMONIA in the last 8760 hours. CBC: Recent Labs    04/20/19 0456 04/21/19 0747 04/22/19 0112 04/25/19 1110 05/25/19  WBC 10.7* 10.4 9.9 8.3 8.2  NEUTROABS 8.0*  --   --   --  6  HGB 13.6 12.3 10.0* 9.2* 12.0  HCT 43.2 40.0 31.7* 28.9* 38  MCV 94.3 96.2 94.3 93.2  --   PLT 234 185 191 230 283   Lipid Panel: No results for input(s): CHOL, HDL, LDLCALC, TRIG, CHOLHDL, LDLDIRECT in the last 8760 hours. TSH: Recent Labs    08/30/18 1423 05/25/19  TSH 0.422* 0.35*   A1C: Lab Results  Component Value Date   HGBA1C 5.2 12/10/2017     Assessment/Plan 1. Noninfected skin tear of right lower extremity, initial encounter Son will change dsg tonight based on EMS instruction. [facility] given to  [facility] for further evaluation and dressing changes.   2. Debility Progressive decline in debility despite PT, most likely multifactorial based on dementia, pain due to OA and with recent COPD exacerbation. Continues to work with PT and home care teams.   3. Primary osteoarthritis involving multiple joints Ongoing, continues to follow with orthopedic for injection to left knee which has shown improvement in most recent injection. Had more pain after 2nd shot which could have contributed to some of the debility. To continue to use tylenol as needed.   4. COPD exacerbation ([place]) Doing well on prednisone taper with breo and albuterol nebulizer.  To notify if symptoms worsen or fail to improve.   2 months for routine follow up, sooner if needed Kira Hartl K. Harle Battiest  [facility] [phone]   Virtual Visit via Video Note- video and audio visit  I connected with [name] on 07/27/19 at  2:15 PM EDT by a video enabled telemedicine application and verified that I am speaking with the correct person using two identifiers.  Location: Patient: home Provider: office    I discussed the limitations of evaluation and management by telemedicine and the availability of in person appointments. The patient expressed understanding and agreed to proceed.    I discussed the assessment and treatment plan with the patient. The patient was provided an opportunity to ask questions and all were answered. The patient agreed with the plan and demonstrated an understanding of the instructions.   The patient was advised to call back or seek an in-person evaluation if the symptoms worsen or if the condition fails to improve as anticipated.  I provided 25 minutes of non-face-to-face time during this encounter.  Carlos American. Dewaine Oats, AGNP Avs printed and mailed.

## 2019-08-01 ENCOUNTER — Encounter: Payer: Self-pay | Admitting: Nurse Practitioner

## 2019-08-01 DIAGNOSIS — Z9181 History of falling: Secondary | ICD-10-CM | POA: Diagnosis not present

## 2019-08-01 DIAGNOSIS — R41841 Cognitive communication deficit: Secondary | ICD-10-CM | POA: Diagnosis not present

## 2019-08-01 DIAGNOSIS — I130 Hypertensive heart and chronic kidney disease with heart failure and stage 1 through stage 4 chronic kidney disease, or unspecified chronic kidney disease: Secondary | ICD-10-CM | POA: Diagnosis not present

## 2019-08-01 DIAGNOSIS — I5032 Chronic diastolic (congestive) heart failure: Secondary | ICD-10-CM | POA: Diagnosis not present

## 2019-08-01 DIAGNOSIS — S72141D Displaced intertrochanteric fracture of right femur, subsequent encounter for closed fracture with routine healing: Secondary | ICD-10-CM | POA: Diagnosis not present

## 2019-08-01 DIAGNOSIS — N183 Chronic kidney disease, stage 3 (moderate): Secondary | ICD-10-CM

## 2019-08-01 DIAGNOSIS — S72414D Nondisplaced unspecified condyle fracture of lower end of right femur, subsequent encounter for closed fracture with routine healing: Secondary | ICD-10-CM | POA: Diagnosis not present

## 2019-08-01 DIAGNOSIS — Z85828 Personal history of other malignant neoplasm of skin: Secondary | ICD-10-CM

## 2019-08-01 DIAGNOSIS — F0391 Unspecified dementia with behavioral disturbance: Secondary | ICD-10-CM | POA: Diagnosis not present

## 2019-08-01 DIAGNOSIS — M545 Low back pain: Secondary | ICD-10-CM

## 2019-08-01 DIAGNOSIS — M150 Primary generalized (osteo)arthritis: Secondary | ICD-10-CM

## 2019-08-02 NOTE — Telephone Encounter (Signed)
Message received in [place] after 4 pm today although it has a date of yesterday.  Message forwarded to Lauree Chandler, NP

## 2019-08-03 ENCOUNTER — Ambulatory Visit (INDEPENDENT_AMBULATORY_CARE_PROVIDER_SITE_OTHER): Payer: Medicare Other | Admitting: Family

## 2019-08-03 ENCOUNTER — Ambulatory Visit
Admission: RE | Admit: 2019-08-03 | Discharge: 2019-08-03 | Disposition: A | Discharge status: Home / Self Care | Payer: Medicare Other | Source: Ambulatory Visit | Attending: Family | Admitting: Family

## 2019-08-03 ENCOUNTER — Other Ambulatory Visit: Payer: Self-pay

## 2019-08-03 ENCOUNTER — Encounter: Payer: Self-pay | Admitting: Family

## 2019-08-03 VITALS — BP 132/80 | HR 77 | Temp 97.8°F | Ht 62.0 in

## 2019-08-03 DIAGNOSIS — F0391 Unspecified dementia with behavioral disturbance: Secondary | ICD-10-CM | POA: Diagnosis not present

## 2019-08-03 DIAGNOSIS — I130 Hypertensive heart and chronic kidney disease with heart failure and stage 1 through stage 4 chronic kidney disease, or unspecified chronic kidney disease: Secondary | ICD-10-CM | POA: Diagnosis not present

## 2019-08-03 DIAGNOSIS — S81811A Laceration without foreign body, right lower leg, initial encounter: Secondary | ICD-10-CM | POA: Diagnosis not present

## 2019-08-03 DIAGNOSIS — Z23 Encounter for immunization: Secondary | ICD-10-CM

## 2019-08-03 DIAGNOSIS — I5032 Chronic diastolic (congestive) heart failure: Secondary | ICD-10-CM | POA: Diagnosis not present

## 2019-08-03 DIAGNOSIS — M25511 Pain in right shoulder: Secondary | ICD-10-CM | POA: Diagnosis not present

## 2019-08-03 DIAGNOSIS — N183 Chronic kidney disease, stage 3 (moderate): Secondary | ICD-10-CM | POA: Diagnosis not present

## 2019-08-03 DIAGNOSIS — M545 Low back pain: Secondary | ICD-10-CM | POA: Diagnosis not present

## 2019-08-03 DIAGNOSIS — S72141D Displaced intertrochanteric fracture of right femur, subsequent encounter for closed fracture with routine healing: Secondary | ICD-10-CM | POA: Diagnosis not present

## 2019-08-03 DIAGNOSIS — T148XXA Other injury of unspecified body region, initial encounter: Secondary | ICD-10-CM | POA: Diagnosis not present

## 2019-08-03 MED ORDER — AMBULATORY NON FORMULARY MEDICATION: 0 refills | Status: DC

## 2019-08-03 NOTE — Patient Instructions (Addendum)
1.Home health Nurse to cleanse right shin area skin tear with saline,pat dry,apply triple antibiotic ointment to wound bed and cover with foam dressing for protection and absorption.change dressing every 3 days and as needed if soiled.Notify provider for any yellow drainage or if wound worsen.  2. Apply foam dressing to left lower leg bruise area for extra protection.   3. Take Vit C 500 mg tablet one by mouth twice daily x 14 days   4. Please get right shoulder X-ray done at [facility] imaging to rule out fracture.

## 2019-08-03 NOTE — Progress Notes (Addendum)
Provider: Lennyx Verdell FNP-C  Lauree Chandler, NP  Patient Care Team: Lauree Chandler, NP as PCP - General (Nurse Practitioner) Fay Records, MD as PCP - Cardiology (Cardiology) Tanda Rockers, MD as Consulting Physician (Pulmonary Disease) Conan Bowens, RN as Registered Nurse [facility] and Palliative Medicine) Pyrtle, Lajuan Lines, MD as Consulting Physician (Gastroenterology)  Extended Emergency Contact Information Primary Emergency Contact: Adcock,David Address: [address] [place]          [place], [place] [address] Johnnette Litter of [place] Phone: [phone] Mobile Phone: [phone] Relation: Brother Secondary Emergency Contact: Eliseo Gum [place] of [place] Phone: [phone] Work Phone: [phone] Mobile Phone: [phone] Relation: Daughter  Code Status:  Full Code  Goals of care: Advanced Directive information Advanced Directives 06/28/2019  Does Patient Have a Medical Advance Directive? Yes  Type of Advance Directive [place]  Does patient want to make changes to medical advance directive? Yes (MAU/Ambulatory/Procedural Areas - Information given)  Copy of [place] in Chart? No - copy requested  Would patient like information on creating a medical advance directive? -     Chief Complaint  Patient presents with  . Acute Visit    Patient is concerned she has fracture in right upper arm and also large Bruise on both shins     HPI:  Pt is a 83 y.o. female seen today for an acute visit for evaluation of  right upper arm /shoulder pain,right leg wound  and left leg bruise x 6 days.she is here escorted by her brother who provides HPI information.He states patient sustained right leg and left leg bruise during transfer from wheelchair unclear.He is worried that right shin area wound might be infected.Dressing was applied by EMS due to bleeding and son has been changing dressing  daily.Home health Nurse was ordered but has not had a visit yet.she has Physical Therapy who has been working with her.He reports small amounts of drainage on the dressing.Left leg bruise not open.she states right leg wound was painful last night but no pain today just tender to touch.she has not taken anything for pain.she denies any fever or chills.  Brother also concerned whether patient has a  fracture in right upper arm/shoulder.He states patient seems to hold hand dangling at times or has to support with a pillow.she is unable to raise arm up above her head due to pain.she denies any numbness,tingling or weakness of hand.   Past Medical History:  Diagnosis Date  . Anxiety 07/08/2017  . Arthritis   . Carotid artery disease ([place])    a. s/p L CEA. b. followed by [facility].  . Carotid stenosis 10/19/2012  . Chronic diastolic CHF (congestive heart failure) ([place])   . CKD (chronic kidney disease), stage III ([place])   . Congestive heart disease ([place])   . COPD (chronic obstructive pulmonary disease) ([place])   . COPD GOLD IV 12/30/2014   PFTs 12/15/14 FEV1  0.57 (29%) ratio 54 p 17% resp to saba    . Cough 01/31/2016  . Dementia with behavioral disturbance ([place]) 07/08/2017  . Diverticulosis   . Dyspnea   . Essential hypertension 12/02/2014  . Fall at home Sept. 2013  . Family history of adverse reaction to anesthesia    SON IS SLOW TO WAKE   . Gastroesophageal reflux disease 07/08/2017  . Hyperlipidemia   . Hypertension   . Hyponatremia 12/02/2014  . Hypotension  02/01/2017  . Incontinent of urine   . Internal hemorrhoid   . Intertrochanteric fracture ([place]) 04/20/2019  . Memory disorder 10/24/2014  . Nausea and vomiting 11/30/2014  . Primary osteoarthritis involving multiple joints 07/08/2017  . Vitamin D deficiency 02/15/2015   Past Surgical History:  Procedure Laterality Date  . APPENDECTOMY    . BREAST REDUCTION SURGERY    . CAROTID ENDARTERECTOMY     left CEA  . CATARACT EXTRACTION Bilateral   .  COLONOSCOPY  2015  . EYE SURGERY     cataracts removed, bilaterally   . INTRAMEDULLARY (IM) NAIL INTERTROCHANTERIC Right 04/21/2019   Procedure: INTRAMEDULLARY (IM) NAIL INTERTROCHANTRIC;  Surgeon: Rod Can, MD;  Location: [facility];  Service: Orthopedics;  Laterality: Right;  . skin cancer removal    . TONSILLECTOMY      Allergies  Allergen Reactions  . Lidocaine Anaphylaxis, Swelling, Rash and Other (See Comments)    Any of the " [facility] "  . Other Other (See Comments)    All drugs that end in "cane'-  novacane etc. Daughter states patient almost died when she had it when she was born    Outpatient Encounter Medications as of 08/03/2019  Medication Sig  . acetaminophen (TYLENOL) 500 MG tablet Take 500 mg by mouth 3 (three) times daily as needed.  . Carbinoxamine Maleate 4 MG TABS Take 1 tablet (4 mg total) by mouth every 8 (eight) hours as needed.  . Cholecalciferol (D3 SUPER STRENGTH) 2000 units CAPS Take 1 capsule (2,000 Units total) by mouth daily.  Marland Kitchen Dextromethorphan-guaiFENesin (MUCINEX DM) 30-600 MG TB12 TAKE 2 TABLETS BY MOUTH TWICE DAILY  . docusate sodium (COLACE) 100 MG capsule Take 100 mg by mouth at bedtime.  . donepezil (ARICEPT) 10 MG tablet TAKE 1 TABLET BY MOUTH EVERY DAY  . furosemide (LASIX) 20 MG tablet Take 1 tablet (20 mg total) by mouth daily.  Marland Kitchen ipratropium-albuterol (DUONEB) 0.5-2.5 (3) MG/3ML SOLN inhale contents of 1 vial in nebulizer every 6 hours  . memantine (NAMENDA) 10 MG tablet Take 1 tablet (10 mg total) by mouth 2 (two) times daily.  . ondansetron (ZOFRAN-ODT) 4 MG disintegrating tablet DISSOLVE 1 TABLET ON THE TONGUE IN THE MORNING  . pantoprazole (PROTONIX) 40 MG tablet Take 1 tablet (40 mg total) by mouth 2 (two) times daily.  . predniSONE (DELTASONE) 10 MG tablet Take two tablets (20mg ) twice daily for three days, then one tablet (10mg ) twice daily for three days, then STOP  . propranolol (INDERAL) 20 MG tablet TAKE 1 TABLET(20 MG) BY MOUTH  TWICE DAILY  . sertraline (ZOLOFT) 50 MG tablet Take 1.5 tablets (75 mg total) by mouth daily.   No facility-administered encounter medications on file as of 08/03/2019.     Review of Systems  Constitutional: Negative for appetite change, chills, fatigue and fever.  Respiratory: Negative for chest tightness, shortness of breath and wheezing.        Chronic cough on oxygen 1.5 Liters via nasal cannula   Cardiovascular: Negative for chest pain, palpitations and leg swelling.  Gastrointestinal: Negative for abdominal distention, abdominal pain, constipation, diarrhea, nausea and vomiting.  Genitourinary: Negative for difficulty urinating, dysuria and urgency.       Incontinent for bladder /bowel   Musculoskeletal: Positive for arthralgias and gait problem.       Right shoulder pain with raising hand.   Skin: Negative for color change, pallor and rash.       Bruises to shin areas  Neurological: Negative for dizziness, numbness and headaches.  Psychiatric/Behavioral: Negative for agitation and sleep disturbance. The patient is not nervous/anxious.     Immunization History  Administered Date(s) Administered  . Influenza Split 08/01/2014  . Influenza, High Dose Seasonal PF 09/08/2017, 09/01/2018  . Influenza,inj,Quad PF,6+ Mos 08/16/2015, 08/15/2016  . Pneumococcal Conjugate-13 03/30/2017  . Pneumococcal-Unspecified 08/01/2014  . Tdap 01/16/2018   Pertinent  Health Maintenance Due  Topic Date Due  . INFLUENZA VACCINE  07/02/2019  . DEXA SCAN  Completed  . PNA vac Low Risk Adult  Completed   Fall Risk  08/03/2019 06/28/2019 05/30/2019 05/20/2019 04/28/2019  Falls in the past year? 1 1 1  0 1  Number falls in past yr: 0 1 1 0 1  Injury with Fall? 1 1 1  0 1  Comment - - - - -  Risk for fall due to : - History of fall(s);Impaired balance/gait;Impaired mobility History of fall(s);Impaired balance/gait - -    Vitals:   08/03/19 1402  BP: 132/80  Pulse: 77  Temp: 97.8 F (36.6 C)   TempSrc: Oral  SpO2: 90%  Height: 5\' 2"  (1.575 m)   Body mass index is 32.56 kg/m. Physical Exam Nursing note reviewed.  Constitutional:      General: She is not in acute distress.    Appearance: Normal appearance. She is obese. She is not ill-appearing.  HENT:     Head: Normocephalic.     Right Ear: Tympanic membrane, ear canal and external ear normal. There is no impacted cerumen.     Left Ear: Tympanic membrane, ear canal and external ear normal. There is no impacted cerumen.     Nose: Nose normal. No congestion or rhinorrhea.     Mouth/Throat:     Mouth: Mucous membranes are moist.     Pharynx: Oropharynx is clear. No oropharyngeal exudate or posterior oropharyngeal erythema.  Eyes:     General: No scleral icterus.       Right eye: No discharge.        Left eye: No discharge.     Conjunctiva/sclera: Conjunctivae normal.     Pupils: Pupils are equal, round, and reactive to light.  Neck:     Musculoskeletal: Normal range of motion. No neck rigidity or muscular tenderness.     Vascular: No carotid bruit.  Cardiovascular:     Rate and Rhythm: Normal rate and regular rhythm.     Pulses: Normal pulses.     Heart sounds: Normal heart sounds. No murmur. No friction rub. No gallop.   Pulmonary:     Effort: Pulmonary effort is normal. No respiratory distress.     Breath sounds: No wheezing, rhonchi or rales.     Comments: Bilateral clear diminished breath sounds. Chest:     Chest wall: No tenderness.  Abdominal:     General: Bowel sounds are normal. There is no distension.     Palpations: Abdomen is soft. There is no mass.     Tenderness: There is no abdominal tenderness. There is no right CVA tenderness, left CVA tenderness, guarding or rebound.  Musculoskeletal:     Right shoulder: She exhibits decreased range of motion and pain. She exhibits no tenderness, no bony tenderness, no swelling, no effusion, no crepitus, no deformity, no laceration, normal pulse and normal strength.      Left shoulder: She exhibits normal range of motion, no tenderness, no swelling, no effusion and no crepitus.     Right knee: She exhibits decreased range of  motion. She exhibits no swelling, no effusion, no ecchymosis, no erythema, normal alignment, no LCL laxity, normal patellar mobility and no bony tenderness.     Right lower leg: No edema.     Left lower leg: No edema.     Comments: Requires x 2 assistance with transfers to wheelchair.right knee arthritic changes.   Lymphadenopathy:     Cervical: No cervical adenopathy.  Skin:    General: Skin is warm and dry.     Coloration: Skin is not pale.     Findings: Bruising present. No erythema or rash.     Comments: 1. Right forearm quarter size purple bruise non-tender to touch.  2. Right leg shin area > quarter size V-shaped skin tear with 95 % purple-bluish skin flap intact.wound bed red in color without any drainage noted.small amounts of dry serous-bloody old drainage.  3. Left lower leg Quarter size purple bruise intact without any signs of infections.   Neurological:     Mental Status: Mental status is at baseline.     Cranial Nerves: No cranial nerve deficit.     Gait: Gait abnormal.     Comments: HOH   Psychiatric:        Mood and Affect: Mood normal.        Behavior: Behavior normal.        Thought Content: Thought content normal.        Judgment: Judgment normal.    Labs reviewed: Recent Labs    04/22/19 0112 04/25/19 1110 04/26/19 1045 05/25/19  NA 142 142 144 141  K 4.2 3.6 3.4* 4.1  CL 107 104 105  --   CO2 26 27 28   --   GLUCOSE 127* 117* 114*  --   BUN 14 18 15 12   CREATININE 0.94 0.74 0.76 0.8  CALCIUM 8.4* 8.5* 8.8*  --    Recent Labs    08/30/18 1423  AST 19  ALT 13  ALKPHOS 67  BILITOT 0.8  PROT 5.8*  ALBUMIN 3.8   Recent Labs    04/20/19 0456 04/21/19 0747 04/22/19 0112 04/25/19 1110 05/25/19  WBC 10.7* 10.4 9.9 8.3 8.2  NEUTROABS 8.0*  --   --   --  6  HGB 13.6 12.3 10.0* 9.2* 12.0   HCT 43.2 40.0 31.7* 28.9* 38  MCV 94.3 96.2 94.3 93.2  --   PLT 234 185 191 230 283   Lab Results  Component Value Date   TSH 0.35 (A) 05/25/2019   Lab Results  Component Value Date   HGBA1C 5.2 12/10/2017   Lab Results  Component Value Date   CHOL 189 06/21/2018   HDL 58 06/21/2018   LDLCALC 103 (H) 06/21/2018   LDLDIRECT 142.0 05/24/2015   TRIG 140 06/21/2018   CHOLHDL 3.3 06/21/2018    Significant Diagnostic Results in last 30 days:  No results found.  Assessment/Plan 1. Right shoulder pain, unspecified chronicity Limited ROM with abduction.No crepitus or tenderness with palpation.   - DG Shoulder Right; Future to rule out fracture.   2. Noninfected skin tear of right lower extremity, initial encounter Wound bed red in color without any signs of infection.Wound cleanse with saline,triple antibiotic ointment applied and covered with foam dressing for extra protection and absorption.Dressing orders for Home health Nurse to change dressing every three days.cleanse right shin area skin tear with saline,pat dry,apply triple antibiotic ointment to wound bed and cover with foam dressing for protection and absorption.change dressing every 3 days and as needed  if soiled.Notify provider for any yellow drainage or if wound worsen.  - Take Vit C 500 mg tablet one by mouth twice daily x 14 days to optimize immunity for wound healing.   3. Bruise Left leg purple bruise cleansed with saline,pat dry and covered with foam dressing for protection.  4. Need for influenza vaccination Afebrile. - Flu Vaccine QUAD High Dose(Fluad) administered by Ruthell Rummage CMA.   Family/ staff Communication: Reviewed plan of care with patient and Brother.   Labs/tests ordered:  - DG Shoulder Right; Future to rule out fracture.  Addendum: right shoulder X-ray results indicated mild degenerative joint disease of the right acromioclavicular joint.No acute abnormality noted.Patient's son notified of  imaging results.will refer to Orthopedic for evaluation.  Sandrea Hughs, NP

## 2019-08-04 DIAGNOSIS — N183 Chronic kidney disease, stage 3 (moderate): Secondary | ICD-10-CM | POA: Diagnosis not present

## 2019-08-04 DIAGNOSIS — M545 Low back pain: Secondary | ICD-10-CM | POA: Diagnosis not present

## 2019-08-04 DIAGNOSIS — F0391 Unspecified dementia with behavioral disturbance: Secondary | ICD-10-CM | POA: Diagnosis not present

## 2019-08-04 DIAGNOSIS — S72141D Displaced intertrochanteric fracture of right femur, subsequent encounter for closed fracture with routine healing: Secondary | ICD-10-CM | POA: Diagnosis not present

## 2019-08-04 DIAGNOSIS — I130 Hypertensive heart and chronic kidney disease with heart failure and stage 1 through stage 4 chronic kidney disease, or unspecified chronic kidney disease: Secondary | ICD-10-CM | POA: Diagnosis not present

## 2019-08-04 DIAGNOSIS — I5032 Chronic diastolic (congestive) heart failure: Secondary | ICD-10-CM | POA: Diagnosis not present

## 2019-08-04 NOTE — Addendum Note (Signed)
Addended byMarlowe Sax C on: 08/04/2019 09:10 AM   Modules accepted: Orders

## 2019-08-05 DIAGNOSIS — M545 Low back pain: Secondary | ICD-10-CM | POA: Diagnosis not present

## 2019-08-05 DIAGNOSIS — N183 Chronic kidney disease, stage 3 (moderate): Secondary | ICD-10-CM | POA: Diagnosis not present

## 2019-08-05 DIAGNOSIS — I5032 Chronic diastolic (congestive) heart failure: Secondary | ICD-10-CM | POA: Diagnosis not present

## 2019-08-05 DIAGNOSIS — I130 Hypertensive heart and chronic kidney disease with heart failure and stage 1 through stage 4 chronic kidney disease, or unspecified chronic kidney disease: Secondary | ICD-10-CM | POA: Diagnosis not present

## 2019-08-05 DIAGNOSIS — F0391 Unspecified dementia with behavioral disturbance: Secondary | ICD-10-CM | POA: Diagnosis not present

## 2019-08-05 DIAGNOSIS — S72141D Displaced intertrochanteric fracture of right femur, subsequent encounter for closed fracture with routine healing: Secondary | ICD-10-CM | POA: Diagnosis not present

## 2019-08-09 ENCOUNTER — Telehealth: Payer: Self-pay | Admitting: *Deleted

## 2019-08-09 ENCOUNTER — Ambulatory Visit (INDEPENDENT_AMBULATORY_CARE_PROVIDER_SITE_OTHER): Payer: Medicare Other | Admitting: Orthopaedic Surgery

## 2019-08-09 ENCOUNTER — Encounter: Payer: Self-pay | Admitting: Orthopaedic Surgery

## 2019-08-09 ENCOUNTER — Ambulatory Visit: Payer: Self-pay

## 2019-08-09 DIAGNOSIS — M25511 Pain in right shoulder: Secondary | ICD-10-CM

## 2019-08-09 DIAGNOSIS — G8929 Other chronic pain: Secondary | ICD-10-CM | POA: Diagnosis not present

## 2019-08-09 MED ORDER — METHYLPREDNISOLONE ACETATE 40 MG/ML IJ SUSP
40.0000 mg | INTRAMUSCULAR | Status: AC | PRN
  Administered 2019-08-09: 40 mg via INTRA_ARTICULAR

## 2019-08-09 NOTE — Telephone Encounter (Signed)
Moe Notified and Verbal order given. Faxed copy of message to Fax: [phone]

## 2019-08-09 NOTE — Progress Notes (Signed)
Office Visit Note   Patient: Virginia [name]           Date of Birth: 1936-05-14           MRN: [id number] Visit Date: 08/09/2019              Requested by: Sandrea Hughs, NP [address] [place],  [place] [address] PCP: Lauree Chandler, NP   Assessment & Plan: Visit Diagnoses:  1. Chronic right shoulder pain     Plan: Impression is right shoulder subacromial bursitis versus rotator cuff pathology.  Due to her low demand, we will proceed with a subacromial cortisone injection as well as home health physical therapy in hopes of giving her some improvement of pain and range of motion.  She will follow-up with [place] if she does not note moderate improvement over the next several weeks.  Call with concerns or questions.  Follow-Up Instructions: Return if symptoms worsen or fail to improve.   Orders:  Orders Placed This Encounter  Procedures  . Large Joint Inj: R subacromial bursa   No orders of the defined types were placed in this encounter.     Procedures: Large Joint Inj: R subacromial bursa on 08/09/2019 2:56 PM Indications: pain Details: 22 G needle Medications: 40 mg methylPREDNISolone acetate 40 MG/ML Outcome: tolerated well, no immediate complications Patient was prepped and draped in the usual sterile fashion.       Clinical Data: No additional findings.   Subjective: Chief Complaint  Patient presents with  . Right Knee - Pain    HPI patient is a pleasant 83 year old female who comes in today with her son.  She is here for follow-up of her right shoulder.  She initially sustained a fall on 04/20/2019 for which she broke her hip.  She has had pain to the right shoulder since.  She had another injury to her right shoulder approximately 3 weeks ago.  Her son is unsure of exactly what happened but notes that since the injury she has been unable to lift her arm or use her arm to push up off of the chair.  Review of Systems as detailed in HPI.  All others reviewed and  are negative.   Objective: Vital Signs: LMP  (LMP Unknown)   Physical Exam well-developed and well-nourished female no acute distress.  Alert and oriented x3.  Ortho Exam examination of her right shoulder reveals active forward flexion to about 20 degrees.  I can passively get her to about 75 degrees.  She is neurovascular intact distally.  Specialty Comments:  No specialty comments available.  Imaging: Imaging reviewed by me in canopy is negative for fracture or other acute findings.  No superior migration of the humeral head.   PMFS History: Patient Active Problem List   Diagnosis Date Noted  . Displaced intertrochanteric fracture of right femur ([place]) 04/21/2019  . Closed intertrochanteric fracture of hip, right, initial encounter ([place]) 04/20/2019  . Leukocytosis 04/20/2019  . Fall at home, initial encounter 04/20/2019  . Primary osteoarthritis involving multiple joints 07/08/2017  . Dementia with behavioral disturbance ([place]) 07/08/2017  . Anxiety 07/08/2017  . Gastroesophageal reflux disease 07/08/2017  . Hypotension 02/01/2017  . CKD (chronic kidney disease), stage III ([place]) 02/01/2017  . Cough 01/31/2016  . Vitamin D deficiency 02/15/2015  . Hyperlipidemia 01/18/2015  . COPD GOLD IV 12/30/2014  . Chronic diastolic CHF (congestive heart failure) ([place]) 12/08/2014  . Dyspnea   . Hyponatremia 12/02/2014  . Essential  hypertension 12/02/2014  . Nausea and vomiting 11/30/2014  . Memory disorder 10/24/2014  . Carotid artery disease ([place]) 10/19/2012   Past Medical History:  Diagnosis Date  . Anxiety 07/08/2017  . Arthritis   . Carotid artery disease ([place])    a. s/p L CEA. b. followed by [facility].  . Carotid stenosis 10/19/2012  . Chronic diastolic CHF (congestive heart failure) ([place])   . CKD (chronic kidney disease), stage III ([place])   . Congestive heart disease ([place])   . COPD (chronic obstructive pulmonary disease) ([place])   . COPD GOLD IV 12/30/2014   PFTs 12/15/14 FEV1  0.57  (29%) ratio 54 p 17% resp to saba    . Cough 01/31/2016  . Dementia with behavioral disturbance ([place]) 07/08/2017  . Diverticulosis   . Dyspnea   . Essential hypertension 12/02/2014  . Fall at home Sept. 2013  . Family history of adverse reaction to anesthesia    SON IS SLOW TO WAKE   . Gastroesophageal reflux disease 07/08/2017  . Hyperlipidemia   . Hypertension   . Hyponatremia 12/02/2014  . Hypotension 02/01/2017  . Incontinent of urine   . Internal hemorrhoid   . Intertrochanteric fracture ([place]) 04/20/2019  . Memory disorder 10/24/2014  . Nausea and vomiting 11/30/2014  . Primary osteoarthritis involving multiple joints 07/08/2017  . Vitamin D deficiency 02/15/2015    Family History  Problem Relation Age of Onset  . Heart disease Father        Before age 56  . Hypertension Father   . Heart attack Father   . Cancer Mother 34       Brain  . Dementia Brother   . Cancer Maternal Aunt 61       breast cancer  . Diabetes Maternal Aunt   . High blood pressure Brother   . Hypothyroidism Daughter   . High blood pressure Son   . High Cholesterol Son   . Diabetes Son   . COPD Son   . Obesity Son   . Heart failure Maternal Grandmother   . Diabetes Maternal Grandmother   . Stroke Neg Hx     Past Surgical History:  Procedure Laterality Date  . APPENDECTOMY    . BREAST REDUCTION SURGERY    . CAROTID ENDARTERECTOMY     left CEA  . CATARACT EXTRACTION Bilateral   . COLONOSCOPY  2015  . EYE SURGERY     cataracts removed, bilaterally   . INTRAMEDULLARY (IM) NAIL INTERTROCHANTERIC Right 04/21/2019   Procedure: INTRAMEDULLARY (IM) NAIL INTERTROCHANTRIC;  Surgeon: Rod Can, MD;  Location: [facility];  Service: Orthopedics;  Laterality: Right;  . skin cancer removal    . TONSILLECTOMY     Social History   Occupational History  . Occupation: retired  Tobacco Use  . Smoking status: Former Smoker    Packs/day: 0.25    Years: 20.00    Pack years: 5.00    Types: Cigarettes    Quit date:  04/2019    Years since quitting: 0.3  . Smokeless tobacco: Never Used  . Tobacco comment: 4 or 5 cigarettes per day average  Substance and Sexual Activity  . Alcohol use: Yes    Alcohol/week: 0.0 standard drinks    Comment: Rare glass of wine  . Drug use: No  . Sexual activity: Not Currently    Birth control/protection: Post-menopausal

## 2019-08-09 NOTE — Telephone Encounter (Signed)
Obtain X-ray of right leg 2-3 views rule out fracture

## 2019-08-09 NOTE — Telephone Encounter (Signed)
Moe with [facility] called and stated that patient was seen 9/2. Stated that the caregiver was wanting a X-Ray of the Right leg due to dragging it and not using it. Wound on Right shin that is red, warm and swollen. Nurse is requesting X-Ray.  Vitals Normal.  Please Advise.

## 2019-08-10 ENCOUNTER — Telehealth: Payer: Self-pay | Admitting: *Deleted

## 2019-08-10 DIAGNOSIS — M545 Low back pain: Secondary | ICD-10-CM | POA: Diagnosis not present

## 2019-08-10 DIAGNOSIS — N183 Chronic kidney disease, stage 3 (moderate): Secondary | ICD-10-CM | POA: Diagnosis not present

## 2019-08-10 DIAGNOSIS — I130 Hypertensive heart and chronic kidney disease with heart failure and stage 1 through stage 4 chronic kidney disease, or unspecified chronic kidney disease: Secondary | ICD-10-CM | POA: Diagnosis not present

## 2019-08-10 DIAGNOSIS — I5032 Chronic diastolic (congestive) heart failure: Secondary | ICD-10-CM | POA: Diagnosis not present

## 2019-08-10 DIAGNOSIS — F0391 Unspecified dementia with behavioral disturbance: Secondary | ICD-10-CM | POA: Diagnosis not present

## 2019-08-10 DIAGNOSIS — S72141D Displaced intertrochanteric fracture of right femur, subsequent encounter for closed fracture with routine healing: Secondary | ICD-10-CM | POA: Diagnosis not present

## 2019-08-10 NOTE — Telephone Encounter (Signed)
X-ray right tibia/fibula/malleous and foot 2-3 views order written

## 2019-08-10 NOTE — Telephone Encounter (Signed)
Moe with [facility] called and stated that [facility] needs an order faxed to them to do a X-Ray of the Right Leg but stated that it has to specify the specific part of leg to be x-rayed. Needs order faxed to [phone].

## 2019-08-10 NOTE — Telephone Encounter (Signed)
Orders faxed

## 2019-08-11 DIAGNOSIS — M25571 Pain in right ankle and joints of right foot: Secondary | ICD-10-CM | POA: Diagnosis not present

## 2019-08-11 DIAGNOSIS — M79671 Pain in right foot: Secondary | ICD-10-CM | POA: Diagnosis not present

## 2019-08-11 DIAGNOSIS — M79661 Pain in right lower leg: Secondary | ICD-10-CM | POA: Diagnosis not present

## 2019-08-12 ENCOUNTER — Telehealth: Payer: Self-pay

## 2019-08-12 DIAGNOSIS — F0391 Unspecified dementia with behavioral disturbance: Secondary | ICD-10-CM | POA: Diagnosis not present

## 2019-08-12 DIAGNOSIS — S72141D Displaced intertrochanteric fracture of right femur, subsequent encounter for closed fracture with routine healing: Secondary | ICD-10-CM | POA: Diagnosis not present

## 2019-08-12 DIAGNOSIS — I130 Hypertensive heart and chronic kidney disease with heart failure and stage 1 through stage 4 chronic kidney disease, or unspecified chronic kidney disease: Secondary | ICD-10-CM | POA: Diagnosis not present

## 2019-08-12 DIAGNOSIS — I5032 Chronic diastolic (congestive) heart failure: Secondary | ICD-10-CM | POA: Diagnosis not present

## 2019-08-12 DIAGNOSIS — M545 Low back pain: Secondary | ICD-10-CM | POA: Diagnosis not present

## 2019-08-12 DIAGNOSIS — N183 Chronic kidney disease, stage 3 (moderate): Secondary | ICD-10-CM | POA: Diagnosis not present

## 2019-08-12 NOTE — Telephone Encounter (Signed)
Ok, thanks.

## 2019-08-12 NOTE — Telephone Encounter (Signed)
Order written 08/11/2019 please fax order.

## 2019-08-12 NOTE — Telephone Encounter (Signed)
Virginia [name] with [facility] wanted to let Dr. Erlinda Hong know that services will start for patient on Sunday, 08/14/2019.

## 2019-08-12 NOTE — Telephone Encounter (Signed)
Order written by Webb Silversmith and faxed

## 2019-08-12 NOTE — Telephone Encounter (Addendum)
Virginia [name], son called and stated that patient had her x-ray done through [facility]) of the tibia and foot.   Stated that they are wanting an x-ray of her Knee, Femur and hip also. Wants an order faxed to [facility] Fax: [phone]  Please Advise.

## 2019-08-12 NOTE — Telephone Encounter (Signed)
fyi

## 2019-08-13 ENCOUNTER — Encounter: Payer: Self-pay | Admitting: Family

## 2019-08-13 DIAGNOSIS — M25561 Pain in right knee: Secondary | ICD-10-CM | POA: Diagnosis not present

## 2019-08-13 DIAGNOSIS — M25551 Pain in right hip: Secondary | ICD-10-CM | POA: Diagnosis not present

## 2019-08-15 DIAGNOSIS — S72141D Displaced intertrochanteric fracture of right femur, subsequent encounter for closed fracture with routine healing: Secondary | ICD-10-CM | POA: Diagnosis not present

## 2019-08-16 DIAGNOSIS — M4856XA Collapsed vertebra, not elsewhere classified, lumbar region, initial encounter for fracture: Secondary | ICD-10-CM | POA: Diagnosis not present

## 2019-08-16 DIAGNOSIS — M545 Low back pain: Secondary | ICD-10-CM | POA: Diagnosis not present

## 2019-08-16 DIAGNOSIS — I130 Hypertensive heart and chronic kidney disease with heart failure and stage 1 through stage 4 chronic kidney disease, or unspecified chronic kidney disease: Secondary | ICD-10-CM | POA: Diagnosis not present

## 2019-08-16 DIAGNOSIS — I5032 Chronic diastolic (congestive) heart failure: Secondary | ICD-10-CM | POA: Diagnosis not present

## 2019-08-16 DIAGNOSIS — S72141D Displaced intertrochanteric fracture of right femur, subsequent encounter for closed fracture with routine healing: Secondary | ICD-10-CM | POA: Diagnosis not present

## 2019-08-16 DIAGNOSIS — F0391 Unspecified dementia with behavioral disturbance: Secondary | ICD-10-CM | POA: Diagnosis not present

## 2019-08-16 DIAGNOSIS — N183 Chronic kidney disease, stage 3 (moderate): Secondary | ICD-10-CM | POA: Diagnosis not present

## 2019-08-17 DIAGNOSIS — F0391 Unspecified dementia with behavioral disturbance: Secondary | ICD-10-CM | POA: Diagnosis not present

## 2019-08-17 DIAGNOSIS — N183 Chronic kidney disease, stage 3 (moderate): Secondary | ICD-10-CM | POA: Diagnosis not present

## 2019-08-17 DIAGNOSIS — I130 Hypertensive heart and chronic kidney disease with heart failure and stage 1 through stage 4 chronic kidney disease, or unspecified chronic kidney disease: Secondary | ICD-10-CM | POA: Diagnosis not present

## 2019-08-17 DIAGNOSIS — S72141D Displaced intertrochanteric fracture of right femur, subsequent encounter for closed fracture with routine healing: Secondary | ICD-10-CM | POA: Diagnosis not present

## 2019-08-17 DIAGNOSIS — I5032 Chronic diastolic (congestive) heart failure: Secondary | ICD-10-CM | POA: Diagnosis not present

## 2019-08-17 DIAGNOSIS — M545 Low back pain: Secondary | ICD-10-CM | POA: Diagnosis not present

## 2019-08-19 DIAGNOSIS — M545 Low back pain: Secondary | ICD-10-CM | POA: Diagnosis not present

## 2019-08-19 DIAGNOSIS — N183 Chronic kidney disease, stage 3 (moderate): Secondary | ICD-10-CM | POA: Diagnosis not present

## 2019-08-19 DIAGNOSIS — S72141D Displaced intertrochanteric fracture of right femur, subsequent encounter for closed fracture with routine healing: Secondary | ICD-10-CM | POA: Diagnosis not present

## 2019-08-19 DIAGNOSIS — F0391 Unspecified dementia with behavioral disturbance: Secondary | ICD-10-CM | POA: Diagnosis not present

## 2019-08-19 DIAGNOSIS — I130 Hypertensive heart and chronic kidney disease with heart failure and stage 1 through stage 4 chronic kidney disease, or unspecified chronic kidney disease: Secondary | ICD-10-CM | POA: Diagnosis not present

## 2019-08-19 DIAGNOSIS — I5032 Chronic diastolic (congestive) heart failure: Secondary | ICD-10-CM | POA: Diagnosis not present

## 2019-08-22 ENCOUNTER — Telehealth: Payer: Self-pay | Admitting: *Deleted

## 2019-08-22 ENCOUNTER — Other Ambulatory Visit: Payer: Self-pay

## 2019-08-22 ENCOUNTER — Other Ambulatory Visit: Payer: Medicare Other | Admitting: *Deleted

## 2019-08-22 DIAGNOSIS — M545 Low back pain: Secondary | ICD-10-CM | POA: Diagnosis not present

## 2019-08-22 DIAGNOSIS — I130 Hypertensive heart and chronic kidney disease with heart failure and stage 1 through stage 4 chronic kidney disease, or unspecified chronic kidney disease: Secondary | ICD-10-CM | POA: Diagnosis not present

## 2019-08-22 DIAGNOSIS — Z515 Encounter for palliative care: Secondary | ICD-10-CM | POA: Diagnosis not present

## 2019-08-22 DIAGNOSIS — F0391 Unspecified dementia with behavioral disturbance: Secondary | ICD-10-CM | POA: Diagnosis not present

## 2019-08-22 DIAGNOSIS — S72141D Displaced intertrochanteric fracture of right femur, subsequent encounter for closed fracture with routine healing: Secondary | ICD-10-CM | POA: Diagnosis not present

## 2019-08-22 DIAGNOSIS — N183 Chronic kidney disease, stage 3 (moderate): Secondary | ICD-10-CM | POA: Diagnosis not present

## 2019-08-22 DIAGNOSIS — I5032 Chronic diastolic (congestive) heart failure: Secondary | ICD-10-CM | POA: Diagnosis not present

## 2019-08-22 NOTE — Progress Notes (Signed)
COMMUNITY PALLIATIVE CARE RN NOTE  PATIENT NAME: Virginia [name] DOB: 1936-02-12 MRN: [id number]  PRIMARY CARE PROVIDER: Lauree Chandler, NP  RESPONSIBLE PARTY:  Acct ID - Guarantor Home Phone Work Phone Relationship Acct Type  [id number] NEYA, [id number]  Self P/F     [address], [place], [place] [address]   Due to the COVID-19 crisis, this virtual check-in visit was done via telephone from my office and it was initiated and consent by this patient and or family.  PLAN OF CARE and INTERVENTION 1. ADVANCE CARE PLANNING/GOALS OF CARE:Goal is for patient remain in her home with her brother and hired caregivers.  2. PATIENT/CAREGIVER EDUCATION: Pain Management, Safe Mobility 3. DISEASE STATUS: Virtual check-in visit completed via telephone. Patient currently sitting up in her recliner awake, visiting with a family friend. She has been c/o increased R shoulder/arm pain w/swelling and back pain. Her brother, [name], states that patient has seemed to have a setback. On my virtual visit 07/08/19, she was ambulatory using her walker w/1 person assistance and no longer requiring the use of Hoyer lift s/p R hip replacement. Now she is unable to walk at all. Mainly only transferring and is back to requiring 2 people for transfers. She has two hired caregivers that come daily from [place]. [name] noticed that her right foot is starting to turn outward. He is giving Tylenol for pain which is helpful. She is scheduled to have a MRI this week on 08/24/19. She seems more agitated over the past few days and family suspects a UTI. She continues with intermittent confusion. A specimen cup has been left in the home to obtain a specimen as patient was unable to provide one while RN was present. She currently has a home health RN/PT. PT just extended their services for a few more weeks and is visiting 1x/week. Patient has a skin tear to her right lower leg and the home health RN visits weekly for dressing changes.  She also has a bruise on her left lower leg, both he states from transfers to the wheelchair. Her intake is adequate, no dysphagia and she is sleeping well. She has an appointment with her PCP next month for her annual check-up. She was seen by her Allergist on 8/20 and was placed on a low dose Prednisone d/t family concerns of a continued rattle in her chest. She continues on Xyzal, Flonase and Breo inhaler daily. She has Carbinoxamine, Albuterol inhaler and Duonebs PRN. No dyspnea/wheezing or congestion reported today. Will continue to monitor.  HISTORY OF PRESENT ILLNESS:  This is a 83 yo female who resides in her home. Her brother, [name], and son, [name], currently live with her. Palliative care team continues to follow patient. Will continue to visit patient monthly and PRN.  CODE STATUS: Full Code ADVANCED DIRECTIVES: Y MOST FORM: no PPS: 30%   (Duration of visit and documentation 60 minutes)   [name], RN BSN

## 2019-08-22 NOTE — Telephone Encounter (Signed)
Received fax from Lance Muss with [facility] stating: "Patient's son called our office stating he thinks his mom has a UTI. He said her urine is foul smelling and she is very out of sorts. Could we get a UA/CX? Please fax back with signature if you are in agreeance (or call with verbal order)."  Per Jessica---Patient needs OV  Called and LM for Lance Muss that patient needs an appointment.   Tried calling patient's home/brother, [name] and [facility] to return call.

## 2019-08-23 ENCOUNTER — Other Ambulatory Visit: Payer: Self-pay

## 2019-08-23 ENCOUNTER — Ambulatory Visit (INDEPENDENT_AMBULATORY_CARE_PROVIDER_SITE_OTHER): Payer: Medicare Other | Admitting: Family

## 2019-08-23 ENCOUNTER — Encounter: Payer: Self-pay | Admitting: Family

## 2019-08-23 VITALS — BP 133/82 | HR 77

## 2019-08-23 DIAGNOSIS — R399 Unspecified symptoms and signs involving the genitourinary system: Secondary | ICD-10-CM | POA: Diagnosis not present

## 2019-08-23 NOTE — Progress Notes (Signed)
This service is provided via telemedicine  No vital signs collected/recorded due to the encounter was a telemedicine visit.   Location of patient (ex: home, work): Home   Patient consents to a telephone visit:  Yes  Location of the provider (ex: office, home): Office  Name of any referring provider:  Sherrie Mustache, NP   Names of all persons participating in the telemedicine service and their role in the encounter:  Senon Nixon NP, Ruthell Rummage CMA, Genia Harold and brother [name]   Time spent on call:  Ruthell Rummage CMA spent 7  minutes on phone with patient     [facility]  Provider: Marlowe Sax NP  Code Status: FULL Goals of Care:  Advanced Directives 06/28/2019  Does Patient Have a Medical Advance Directive? Yes  Type of Advance Directive [place]  Does patient want to make changes to medical advance directive? Yes (MAU/Ambulatory/Procedural Areas - Information given)  Copy of [place] in Chart? No - copy requested  Would patient like information on creating a medical advance directive? -     Chief Complaint  Patient presents with  . Acute Visit    Aide Complains of foul smelling urine and abnormal behavior patient unable to come to office needs orders for [facility] to obtain speciment    HPI: Patient is a 83 y.o. female seen today for an acute visit for complains of foul smelling urine and felt tired since Saturday.HPI provided by patient's spouse.she has also had abnormal behavior though husband states she was better last night and today.she has had tactile fever but no fever or chills.she wears depends.Aids and Husband have been pushing fluids.she denies any nausea,vomting,abdominal or back pain. Patient unable to come to office needs orders for [facility] to obtain speciment  Past Medical History:  Diagnosis Date  . Anxiety 07/08/2017  . Arthritis   . Carotid artery disease ([place])    a. s/p L CEA. b. followed by [facility].  .  Carotid stenosis 10/19/2012  . Chronic diastolic CHF (congestive heart failure) ([place])   . CKD (chronic kidney disease), stage III ([place])   . Congestive heart disease ([place])   . COPD (chronic obstructive pulmonary disease) ([place])   . COPD GOLD IV 12/30/2014   PFTs 12/15/14 FEV1  0.57 (29%) ratio 54 p 17% resp to saba    . Cough 01/31/2016  . Dementia with behavioral disturbance ([place]) 07/08/2017  . Diverticulosis   . Dyspnea   . Essential hypertension 12/02/2014  . Fall at home Sept. 2013  . Family history of adverse reaction to anesthesia    SON IS SLOW TO WAKE   . Gastroesophageal reflux disease 07/08/2017  . Hyperlipidemia   . Hypertension   . Hyponatremia 12/02/2014  . Hypotension 02/01/2017  . Incontinent of urine   . Internal hemorrhoid   . Intertrochanteric fracture ([place]) 04/20/2019  . Memory disorder 10/24/2014  . Nausea and vomiting 11/30/2014  . Primary osteoarthritis involving multiple joints 07/08/2017  . Vitamin D deficiency 02/15/2015    Past Surgical History:  Procedure Laterality Date  . APPENDECTOMY    . BREAST REDUCTION SURGERY    . CAROTID ENDARTERECTOMY     left CEA  . CATARACT EXTRACTION Bilateral   . COLONOSCOPY  2015  . EYE SURGERY     cataracts removed, bilaterally   . INTRAMEDULLARY (IM) NAIL INTERTROCHANTERIC Right 04/21/2019   Procedure: INTRAMEDULLARY (IM) NAIL INTERTROCHANTRIC;  Surgeon: Rod Can, MD;  Location: [facility];  Service: Orthopedics;  Laterality: Right;  . skin cancer removal    . TONSILLECTOMY      Allergies  Allergen Reactions  . Lidocaine Anaphylaxis, Swelling, Rash and Other (See Comments)    Any of the " [facility] "  . Other Other (See Comments)    All drugs that end in "cane'-  novacane etc. Daughter states patient almost died when she had it when she was born    Outpatient Encounter Medications as of 08/23/2019  Medication Sig  . acetaminophen (TYLENOL) 500 MG tablet Take 500 mg by mouth 3 (three) times daily as needed.  .  AMBULATORY NON FORMULARY MEDICATION 1.Home health Nurse to cleanse right shin area skin tear with saline,pat dry,apply triple antibiotic ointment to wound bed and cover with foam dressing for protection and absorption.change dressing every 3 days and as needed if soiled.Notify provider for any yellow drainage or if wound worsen.  2. Apply foam dressing to left lower leg bruise area for extra protection.  . Carbinoxamine Maleate 4 MG TABS Take 1 tablet (4 mg total) by mouth every 8 (eight) hours as needed.  . Cholecalciferol (D3 SUPER STRENGTH) 2000 units CAPS Take 1 capsule (2,000 Units total) by mouth daily.  Marland Kitchen Dextromethorphan-guaiFENesin (MUCINEX DM) 30-600 MG TB12 TAKE 2 TABLETS BY MOUTH TWICE DAILY  . docusate sodium (COLACE) 100 MG capsule Take 100 mg by mouth at bedtime.  . donepezil (ARICEPT) 10 MG tablet TAKE 1 TABLET BY MOUTH EVERY DAY  . furosemide (LASIX) 20 MG tablet Take 1 tablet (20 mg total) by mouth daily.  Marland Kitchen ipratropium-albuterol (DUONEB) 0.5-2.5 (3) MG/3ML SOLN inhale contents of 1 vial in nebulizer every 6 hours  . memantine (NAMENDA) 10 MG tablet Take 1 tablet (10 mg total) by mouth 2 (two) times daily.  . ondansetron (ZOFRAN-ODT) 4 MG disintegrating tablet DISSOLVE 1 TABLET ON THE TONGUE IN THE MORNING  . pantoprazole (PROTONIX) 40 MG tablet Take 1 tablet (40 mg total) by mouth 2 (two) times daily.  . predniSONE (DELTASONE) 10 MG tablet Take two tablets (20mg ) twice daily for three days, then one tablet (10mg ) twice daily for three days, then STOP  . propranolol (INDERAL) 20 MG tablet TAKE 1 TABLET(20 MG) BY MOUTH TWICE DAILY  . sertraline (ZOLOFT) 50 MG tablet Take 1.5 tablets (75 mg total) by mouth daily.   No facility-administered encounter medications on file as of 08/23/2019.     Review of Systems:  Review of Systems  Constitutional: Negative for appetite change, chills, fatigue and fever.  Eyes: Negative for discharge, redness and itching.  Respiratory: Negative for  cough, chest tightness, shortness of breath and wheezing.   Cardiovascular: Negative for chest pain, palpitations and leg swelling.  Gastrointestinal: Negative for abdominal distention, abdominal pain, constipation, diarrhea, nausea and vomiting.  Genitourinary: Negative for difficulty urinating, dysuria and urgency.  Skin: Negative for color change, pallor and rash.  Neurological: Negative for dizziness, light-headedness and headaches.  Psychiatric/Behavioral: Positive for confusion. Negative for agitation and sleep disturbance. The patient is not nervous/anxious.     Health Maintenance  Topic Date Due  . TETANUS/TDAP  01/17/2028  . INFLUENZA VACCINE  Completed  . DEXA SCAN  Completed  . PNA vac Low Risk Adult  Completed    Physical Exam: Vitals:   08/23/19 1100  BP: 133/82  Pulse: 77  SpO2: 92%   There is no height or weight on file to calculate BMI. Physical Exam Unable to complete on telephone visit.  Labs reviewed: Basic Metabolic  Panel: Recent Labs    08/30/18 1423  04/22/19 0112 04/25/19 1110 04/26/19 1045 05/25/19  NA 141   < > 142 142 144 141  K 4.8   < > 4.2 3.6 3.4* 4.1  CL 99   < > 107 104 105  --   CO2 27   < > 26 27 28   --   GLUCOSE 131*   < > 127* 117* 114*  --   BUN 20   < > 14 18 15 12   CREATININE 1.39*   < > 0.94 0.74 0.76 0.8  CALCIUM 8.7   < > 8.4* 8.5* 8.8*  --   TSH 0.422*  --   --   --   --  0.35*   < > = values in this interval not displayed.   Liver Function Tests: Recent Labs    08/30/18 1423  AST 19  ALT 13  ALKPHOS 67  BILITOT 0.8  PROT 5.8*  ALBUMIN 3.8   No results for input(s): LIPASE, AMYLASE in the last 8760 hours. No results for input(s): AMMONIA in the last 8760 hours. CBC: Recent Labs    04/20/19 0456 04/21/19 0747 04/22/19 0112 04/25/19 1110 05/25/19  WBC 10.7* 10.4 9.9 8.3 8.2  NEUTROABS 8.0*  --   --   --  6  HGB 13.6 12.3 10.0* 9.2* 12.0  HCT 43.2 40.0 31.7* 28.9* 38  MCV 94.3 96.2 94.3 93.2  --   PLT 234  185 191 230 283   Lipid Panel: No results for input(s): CHOL, HDL, LDLCALC, TRIG, CHOLHDL, LDLDIRECT in the last 8760 hours. Lab Results  Component Value Date   HGBA1C 5.2 12/10/2017    Procedures since last visit: Dg Shoulder Right  Result Date: 08/03/2019 CLINICAL DATA:  Right shoulder pain for 2 weeks without known injury. EXAM: RIGHT SHOULDER - 2+ VIEW COMPARISON:  None. FINDINGS: There is no evidence of fracture or dislocation. Mild degenerative changes may be involving the right acromioclavicular joint. Soft tissues are unremarkable. IMPRESSION: Mild degenerative joint disease of the right acromioclavicular joint. No acute abnormality seen in the right shoulder. Electronically Signed   By: Marijo Conception M.D.   On: 08/03/2019 16:08    Assessment/Plan   Symptoms of urinary tract infection Tactile fever. No abdominal pain or back reported. - For home use only DME Other see comment - [facility]N to obtain urine specimen for U/A and C/S to rule out UTI Order faxed by CMA to [facility].  -continue to encourage fluid intake.   Labs/tests ordered:   urine specimen for U/A and C/S to rule out UTI Next appt:  09/23/2019 for Routine visit with PCP   Spent 11 minutes of non-face to face with patient

## 2019-08-23 NOTE — Telephone Encounter (Signed)
Patient brother cannot bring patient into office. Stated that if she has to be seen it needs to be a TeleVisit.   Scheduled TeleVisit with Virginia [name] for today.

## 2019-08-23 NOTE — Patient Instructions (Signed)
Acute Urinary Retention, Female  Acute urinary retention means that you cannot pee (urinate) at all, or that you pee too little and your bladder is not emptied completely. If it is not treated, it can lead to kidney damage or other serious problems. Follow these instructions at home:  Take over-the-counter and prescription medicines only as told by your doctor. Ask your doctor what medicines you should stay away from. Do not take any medicine unless your doctor says it is okay to do so.  If you were sent home with a tube that drains pee from the bladder (catheter), take care of it as told by your doctor.  Drink enough fluid to keep your pee clear or pale yellow.  If you were given an antibiotic, take it as told by your doctor. Do not stop taking the antibiotic even if you start to feel better.  Do not use any products that contain nicotine or tobacco, such as cigarettes and e-cigarettes. If you need help quitting, ask your doctor.  Watch for changes in your symptoms. Tell your doctor about them.  If told, keep track of any changes in your blood pressure at home. Tell your doctor about them.  Keep all follow-up visits as told by your doctor. This is important. Contact a doctor if:  You have spasms or you leak pee when you have spasms. Get help right away if:  You have chills or a fever.  You have blood in your pee.  You have a tube that drains the bladder and: ? The tube stops draining pee. ? The tube falls out. Summary  Acute urinary retention means that you cannot pee at all, or that you pee too little and your bladder is not emptied completely. If it is not treated, it can result in kidney damage or other serious problems.  If you were sent home with a tube that drains pee from the bladder, take care of it as told by your doctor.  Pay attention to any changes in your symptoms. Tell your doctor about them. This information is not intended to replace advice given to you by your  health care provider. Make sure you discuss any questions you have with your health care provider. Document Released: 05/05/2008 Document Revised: 10/30/2017 Document Reviewed: 12/19/2016 Elsevier Patient Education  2020 Elsevier Inc.  

## 2019-08-24 DIAGNOSIS — M545 Low back pain: Secondary | ICD-10-CM | POA: Diagnosis not present

## 2019-08-26 ENCOUNTER — Telehealth: Payer: Self-pay

## 2019-08-26 DIAGNOSIS — N183 Chronic kidney disease, stage 3 (moderate): Secondary | ICD-10-CM | POA: Diagnosis not present

## 2019-08-26 DIAGNOSIS — I5032 Chronic diastolic (congestive) heart failure: Secondary | ICD-10-CM | POA: Diagnosis not present

## 2019-08-26 DIAGNOSIS — I130 Hypertensive heart and chronic kidney disease with heart failure and stage 1 through stage 4 chronic kidney disease, or unspecified chronic kidney disease: Secondary | ICD-10-CM | POA: Diagnosis not present

## 2019-08-26 DIAGNOSIS — S72141D Displaced intertrochanteric fracture of right femur, subsequent encounter for closed fracture with routine healing: Secondary | ICD-10-CM | POA: Diagnosis not present

## 2019-08-26 DIAGNOSIS — F0391 Unspecified dementia with behavioral disturbance: Secondary | ICD-10-CM | POA: Diagnosis not present

## 2019-08-26 DIAGNOSIS — M545 Low back pain: Secondary | ICD-10-CM | POA: Diagnosis not present

## 2019-08-26 NOTE — Telephone Encounter (Signed)
This is the second time [name], pts brother, has come in the office and gotten loud, been demanding and angry. Called him personally to discuss behavior. Gently told him aggressive behavior and profane language was not tolerated in the office. Explained to him if this happens again he will not be allowed back in the office and his sister will need to find another caregiver to bring her to appts. [name] stated "that is fair and I apologize for my behavior."  Sherrie Mustache, NP

## 2019-08-26 NOTE — Telephone Encounter (Signed)
Patients brother [name] came into the office demanding for [place] to give him something to get urine for his sister to have a urinalysis done by the home health nurses he then came in the back where we sit  and  I told him we had to see if there was an order for the test in the system for this he became very upset and stated to swear Chrae informed him that if he continued with that sort of language we would have to ask him to leave he returned back out front  after that we agreed to give him the specimen cup when I gave him the cup he said it was not the cup he needed but the hat for the toilet I gave him one he apologized and left

## 2019-08-27 DIAGNOSIS — M545 Low back pain: Secondary | ICD-10-CM | POA: Diagnosis not present

## 2019-08-27 DIAGNOSIS — N183 Chronic kidney disease, stage 3 (moderate): Secondary | ICD-10-CM | POA: Diagnosis not present

## 2019-08-27 DIAGNOSIS — N390 Urinary tract infection, site not specified: Secondary | ICD-10-CM | POA: Diagnosis not present

## 2019-08-27 DIAGNOSIS — S72141D Displaced intertrochanteric fracture of right femur, subsequent encounter for closed fracture with routine healing: Secondary | ICD-10-CM | POA: Diagnosis not present

## 2019-08-27 DIAGNOSIS — F0391 Unspecified dementia with behavioral disturbance: Secondary | ICD-10-CM | POA: Diagnosis not present

## 2019-08-27 DIAGNOSIS — I5032 Chronic diastolic (congestive) heart failure: Secondary | ICD-10-CM | POA: Diagnosis not present

## 2019-08-27 DIAGNOSIS — I130 Hypertensive heart and chronic kidney disease with heart failure and stage 1 through stage 4 chronic kidney disease, or unspecified chronic kidney disease: Secondary | ICD-10-CM | POA: Diagnosis not present

## 2019-08-31 ENCOUNTER — Other Ambulatory Visit: Payer: Self-pay | Admitting: Nurse Practitioner

## 2019-08-31 ENCOUNTER — Telehealth: Payer: Self-pay | Admitting: *Deleted

## 2019-08-31 DIAGNOSIS — M5136 Other intervertebral disc degeneration, lumbar region: Secondary | ICD-10-CM | POA: Diagnosis not present

## 2019-08-31 DIAGNOSIS — S72141D Displaced intertrochanteric fracture of right femur, subsequent encounter for closed fracture with routine healing: Secondary | ICD-10-CM | POA: Diagnosis not present

## 2019-08-31 DIAGNOSIS — Z9181 History of falling: Secondary | ICD-10-CM | POA: Diagnosis not present

## 2019-08-31 DIAGNOSIS — N183 Chronic kidney disease, stage 3 (moderate): Secondary | ICD-10-CM | POA: Diagnosis not present

## 2019-08-31 DIAGNOSIS — Z85828 Personal history of other malignant neoplasm of skin: Secondary | ICD-10-CM | POA: Diagnosis not present

## 2019-08-31 DIAGNOSIS — F0391 Unspecified dementia with behavioral disturbance: Secondary | ICD-10-CM | POA: Diagnosis not present

## 2019-08-31 DIAGNOSIS — M545 Low back pain: Secondary | ICD-10-CM | POA: Diagnosis not present

## 2019-08-31 DIAGNOSIS — R41841 Cognitive communication deficit: Secondary | ICD-10-CM | POA: Diagnosis not present

## 2019-08-31 DIAGNOSIS — I5032 Chronic diastolic (congestive) heart failure: Secondary | ICD-10-CM | POA: Diagnosis not present

## 2019-08-31 DIAGNOSIS — I130 Hypertensive heart and chronic kidney disease with heart failure and stage 1 through stage 4 chronic kidney disease, or unspecified chronic kidney disease: Secondary | ICD-10-CM | POA: Diagnosis not present

## 2019-08-31 DIAGNOSIS — M150 Primary generalized (osteo)arthritis: Secondary | ICD-10-CM | POA: Diagnosis not present

## 2019-08-31 MED ORDER — SACCHAROMYCES BOULARDII 250 MG PO CAPS: 250.0000 mg | ORAL_CAPSULE | Freq: Two times a day (BID) | ORAL | 0 refills | Status: DC

## 2019-08-31 MED ORDER — CIPROFLOXACIN HCL 500 MG PO TABS: 500.0000 mg | ORAL_TABLET | Freq: Two times a day (BID) | ORAL | 0 refills | Status: DC

## 2019-08-31 NOTE — Telephone Encounter (Signed)
Received results and placed in Dinah's folder for review.

## 2019-08-31 NOTE — Telephone Encounter (Signed)
Called and spoke with Virginia [name] and she is going to fax the Culture and sensitivity.

## 2019-08-31 NOTE — Telephone Encounter (Signed)
Urine culture positivity for E.coli > 100,000 colonies start on Cipro 500 mg tablet one by twice daily x 7 days along with Florastor 250 mg capsule one by mouth twice daily x 10 days for prophylaxis.

## 2019-08-31 NOTE — Telephone Encounter (Signed)
LM on patient's answering machine that medication was sent to pharmacy.  Called and notified Myna Bright with [facility].

## 2019-08-31 NOTE — Telephone Encounter (Signed)
Moe with [facility] called and stated that she faxed patient's U/A and C/X yesterday. Stated that it is Positive for UTI and requesting Antibiotic to be sent to pharmacy. (Labs placed in Dinah's folder for review yesterday) Please Advise.

## 2019-08-31 NOTE — Telephone Encounter (Signed)
Awaiting for culture and sensitivity results.

## 2019-09-01 DIAGNOSIS — I130 Hypertensive heart and chronic kidney disease with heart failure and stage 1 through stage 4 chronic kidney disease, or unspecified chronic kidney disease: Secondary | ICD-10-CM | POA: Diagnosis not present

## 2019-09-01 DIAGNOSIS — S72141D Displaced intertrochanteric fracture of right femur, subsequent encounter for closed fracture with routine healing: Secondary | ICD-10-CM | POA: Diagnosis not present

## 2019-09-01 DIAGNOSIS — N183 Chronic kidney disease, stage 3 (moderate): Secondary | ICD-10-CM | POA: Diagnosis not present

## 2019-09-01 DIAGNOSIS — F0391 Unspecified dementia with behavioral disturbance: Secondary | ICD-10-CM | POA: Diagnosis not present

## 2019-09-01 DIAGNOSIS — M545 Low back pain: Secondary | ICD-10-CM | POA: Diagnosis not present

## 2019-09-01 DIAGNOSIS — I5032 Chronic diastolic (congestive) heart failure: Secondary | ICD-10-CM | POA: Diagnosis not present

## 2019-09-05 DIAGNOSIS — S72141D Displaced intertrochanteric fracture of right femur, subsequent encounter for closed fracture with routine healing: Secondary | ICD-10-CM | POA: Diagnosis not present

## 2019-09-05 DIAGNOSIS — I130 Hypertensive heart and chronic kidney disease with heart failure and stage 1 through stage 4 chronic kidney disease, or unspecified chronic kidney disease: Secondary | ICD-10-CM | POA: Diagnosis not present

## 2019-09-05 DIAGNOSIS — M545 Low back pain: Secondary | ICD-10-CM | POA: Diagnosis not present

## 2019-09-05 DIAGNOSIS — F0391 Unspecified dementia with behavioral disturbance: Secondary | ICD-10-CM | POA: Diagnosis not present

## 2019-09-05 DIAGNOSIS — I5032 Chronic diastolic (congestive) heart failure: Secondary | ICD-10-CM | POA: Diagnosis not present

## 2019-09-05 DIAGNOSIS — N183 Chronic kidney disease, stage 3 (moderate): Secondary | ICD-10-CM | POA: Diagnosis not present

## 2019-09-06 ENCOUNTER — Other Ambulatory Visit: Payer: Self-pay | Admitting: Neurology

## 2019-09-06 ENCOUNTER — Telehealth: Payer: Self-pay | Admitting: *Deleted

## 2019-09-06 DIAGNOSIS — I130 Hypertensive heart and chronic kidney disease with heart failure and stage 1 through stage 4 chronic kidney disease, or unspecified chronic kidney disease: Secondary | ICD-10-CM | POA: Diagnosis not present

## 2019-09-06 DIAGNOSIS — I5032 Chronic diastolic (congestive) heart failure: Secondary | ICD-10-CM | POA: Diagnosis not present

## 2019-09-06 DIAGNOSIS — N183 Chronic kidney disease, stage 3 (moderate): Secondary | ICD-10-CM | POA: Diagnosis not present

## 2019-09-06 DIAGNOSIS — S72141D Displaced intertrochanteric fracture of right femur, subsequent encounter for closed fracture with routine healing: Secondary | ICD-10-CM | POA: Diagnosis not present

## 2019-09-06 DIAGNOSIS — F0391 Unspecified dementia with behavioral disturbance: Secondary | ICD-10-CM | POA: Diagnosis not present

## 2019-09-06 DIAGNOSIS — M545 Low back pain: Secondary | ICD-10-CM | POA: Diagnosis not present

## 2019-09-06 NOTE — Telephone Encounter (Signed)
Returned a phone call to patient's daughter [name], who is requesting a RN visit. Visit scheduled for tomorrow, Wednesday 09/07/19 at 12:30 pm.

## 2019-09-07 ENCOUNTER — Other Ambulatory Visit: Payer: Self-pay

## 2019-09-07 ENCOUNTER — Other Ambulatory Visit: Payer: Medicare Other | Admitting: *Deleted

## 2019-09-07 DIAGNOSIS — Z515 Encounter for palliative care: Secondary | ICD-10-CM

## 2019-09-08 ENCOUNTER — Encounter: Payer: Self-pay | Admitting: Nurse Practitioner

## 2019-09-08 ENCOUNTER — Ambulatory Visit
Admission: RE | Admit: 2019-09-08 | Discharge: 2019-09-08 | Disposition: A | Discharge status: Home / Self Care | Payer: Medicare Other | Source: Ambulatory Visit | Attending: Nurse Practitioner | Admitting: Nurse Practitioner

## 2019-09-08 ENCOUNTER — Ambulatory Visit (INDEPENDENT_AMBULATORY_CARE_PROVIDER_SITE_OTHER): Payer: Medicare Other | Admitting: Nurse Practitioner

## 2019-09-08 ENCOUNTER — Telehealth: Payer: Self-pay

## 2019-09-08 ENCOUNTER — Other Ambulatory Visit: Payer: Self-pay

## 2019-09-08 ENCOUNTER — Other Ambulatory Visit: Payer: Self-pay | Admitting: Nurse Practitioner

## 2019-09-08 DIAGNOSIS — R05 Cough: Secondary | ICD-10-CM | POA: Diagnosis not present

## 2019-09-08 DIAGNOSIS — R059 Cough, unspecified: Secondary | ICD-10-CM

## 2019-09-08 DIAGNOSIS — M19011 Primary osteoarthritis, right shoulder: Secondary | ICD-10-CM | POA: Diagnosis not present

## 2019-09-08 NOTE — Telephone Encounter (Signed)
Patients brother called to say he was instructed to take his sister for chest X-Ray as he was with her at another appointment and they told him her lungs sounded like she had fluid on them he wanted a referral for the chest X-Ray from provider Sherrie Mustache NP I spoke with Janett Billow and she said to instruct him he could take patient to an Urgent Care and didn't need a referral but when I went to relay this message the phone had hung up I tried to call him back at his cell listing an got no answer if he calls back I will relay the message

## 2019-09-08 NOTE — Progress Notes (Signed)
This service is provided via telemedicine  No vital signs collected/recorded due to the encounter was a telemedicine visit.   Location of patient (ex: home, work):  [employer] in parking lot of [place]  Patient consents to a telephone visit:  Yes  Location of the provider (ex: office, home):   [facility]  Name of any referring provider:  Sherrie Mustache, NP  Names of all persons participating in the telemedicine service and their role in the encounter:  [name], daughter; Jaiyla Belson; Bonney Leitz, [place]'; Sherrie Mustache, NP  Time spent on call:  6:47 minutes CMA time only.     Careteam: Patient Care Team: Lauree Chandler, NP as PCP - General (Nurse Practitioner) Fay Records, MD as PCP - Cardiology (Cardiology) Tanda Rockers, MD as Consulting Physician (Pulmonary Disease) Conan Bowens, RN as Registered Nurse [facility] and Palliative Medicine) Hilarie Fredrickson Lajuan Lines, MD as Consulting Physician (Gastroenterology)  Advanced Directive information    Allergies  Allergen Reactions  . Lidocaine Anaphylaxis, Swelling, Rash and Other (See Comments)    Any of the " [facility] "  . Other Other (See Comments)    All drugs that end in "cane'-  novacane etc. Daughter states patient almost died when she had it when she was born    Chief Complaint  Patient presents with  . Acute Visit    Concern for aspiration pneumonia, spoke with daughter, [name].     HPI: Patient is a 83 y.o. female for tele-visit.  Pt with complex medical history. Daughter is concerned because this morning when she gave her morning medications she coughed and had sputum to come up. Concerned that she aspirated. Had her do the IS. Has the day has progressed she has had worsening wet cough. Already has ST involved and has made modifications in diet.  Daughter is not sure how swallowing was the rest of the day as she was helping her dad who is under hospice.  Daughter on the phone and feels like  overall her mother has been declining. She can hardly transfer now and not walk.  Daughter reports they are considering hospice. palliative nurse as been following closely.  Currently without fever that they are aware of (they have not taken it), no shortness of breath at this time.  Remains on chronic O2 2L.   Review of Systems:  Review of Systems  Unable to perform ROS: Dementia    Past Medical History:  Diagnosis Date  . Anxiety 07/08/2017  . Arthritis   . Carotid artery disease ([place])    a. s/p L CEA. b. followed by [facility].  . Carotid stenosis 10/19/2012  . Chronic diastolic CHF (congestive heart failure) ([place])   . CKD (chronic kidney disease), stage III   . Congestive heart disease ([place])   . COPD (chronic obstructive pulmonary disease) ([place])   . COPD GOLD IV 12/30/2014   PFTs 12/15/14 FEV1  0.57 (29%) ratio 54 p 17% resp to saba    . Cough 01/31/2016  . Dementia with behavioral disturbance ([place]) 07/08/2017  . Diverticulosis   . Dyspnea   . Essential hypertension 12/02/2014  . Fall at home Sept. 2013  . Family history of adverse reaction to anesthesia    SON IS SLOW TO WAKE   . Gastroesophageal reflux disease 07/08/2017  . Hyperlipidemia   . Hypertension   . Hyponatremia 12/02/2014  . Hypotension 02/01/2017  . Incontinent of urine   . Internal hemorrhoid   . Intertrochanteric  fracture ([place]) 04/20/2019  . Memory disorder 10/24/2014  . Nausea and vomiting 11/30/2014  . Primary osteoarthritis involving multiple joints 07/08/2017  . Vitamin D deficiency 02/15/2015   Past Surgical History:  Procedure Laterality Date  . APPENDECTOMY    . BREAST REDUCTION SURGERY    . CAROTID ENDARTERECTOMY     left CEA  . CATARACT EXTRACTION Bilateral   . COLONOSCOPY  2015  . EYE SURGERY     cataracts removed, bilaterally   . INTRAMEDULLARY (IM) NAIL INTERTROCHANTERIC Right 04/21/2019   Procedure: INTRAMEDULLARY (IM) NAIL INTERTROCHANTRIC;  Surgeon: Rod Can, MD;  Location: [facility];  Service:  Orthopedics;  Laterality: Right;  . skin cancer removal    . TONSILLECTOMY     Social History:   reports that she quit smoking about 5 months ago. Her smoking use included cigarettes. She has a 5.00 pack-year smoking history. She has never used smokeless tobacco. She reports previous alcohol use. She reports that she does not use drugs.  Family History  Problem Relation Age of Onset  . Heart disease Father        Before age 52  . Hypertension Father   . Heart attack Father   . Cancer Mother 47       Brain  . Dementia Brother   . Cancer Maternal Aunt 61       breast cancer  . Diabetes Maternal Aunt   . High blood pressure Brother   . Hypothyroidism Daughter   . High blood pressure Son   . High Cholesterol Son   . Diabetes Son   . COPD Son   . Obesity Son   . Heart failure Maternal Grandmother   . Diabetes Maternal Grandmother   . Stroke Neg Hx     Medications: Patient's Medications  New Prescriptions   No medications on file  Previous Medications   ACETAMINOPHEN (TYLENOL) 500 MG TABLET    Take 500 mg by mouth 3 (three) times daily as needed.   AMBULATORY NON FORMULARY MEDICATION    1.Home health Nurse to cleanse right shin area skin tear with saline,pat dry,apply triple antibiotic ointment to wound bed and cover with foam dressing for protection and absorption.change dressing every 3 days and as needed if soiled.Notify provider for any yellow drainage or if wound worsen.  2. Apply foam dressing to left lower leg bruise area for extra protection.   CARBINOXAMINE MALEATE 4 MG TABS    Take 1 tablet (4 mg total) by mouth every 8 (eight) hours as needed.   CHOLECALCIFEROL (D3 SUPER STRENGTH) 2000 UNITS CAPS    Take 1 capsule (2,000 Units total) by mouth daily.   CIPROFLOXACIN (CIPRO) 500 MG TABLET    Take 1 tablet (500 mg total) by mouth 2 (two) times daily. For 7 days   DEXTROMETHORPHAN-GUAIFENESIN (MUCINEX DM) 30-600 MG TB12    TAKE 2 TABLETS BY MOUTH TWICE DAILY   DOCUSATE  SODIUM (COLACE) 100 MG CAPSULE    Take 100 mg by mouth at bedtime.   DONEPEZIL (ARICEPT) 10 MG TABLET    TAKE 1 TABLET BY MOUTH EVERY DAY   FUROSEMIDE (LASIX) 20 MG TABLET    Take 1 tablet (20 mg total) by mouth daily.   IPRATROPIUM-ALBUTEROL (DUONEB) 0.5-2.5 (3) MG/3ML SOLN    inhale contents of 1 vial in nebulizer every 6 hours   MEMANTINE (NAMENDA) 10 MG TABLET    TAKE 1 TABLET BY MOUTH TWICE DAILY   ONDANSETRON (ZOFRAN-ODT) 4 MG DISINTEGRATING TABLET  DISSOLVE 1 TABLET ON THE TONGUE IN THE MORNING   PANTOPRAZOLE (PROTONIX) 40 MG TABLET    Take 1 tablet (40 mg total) by mouth 2 (two) times daily.   PREDNISONE (DELTASONE) 10 MG TABLET    Take two tablets (20mg ) twice daily for three days, then one tablet (10mg ) twice daily for three days, then STOP   PROPRANOLOL (INDERAL) 20 MG TABLET    TAKE 1 TABLET(20 MG) BY MOUTH TWICE DAILY   SACCHAROMYCES BOULARDII (FLORASTOR) 250 MG CAPSULE    Take 1 capsule (250 mg total) by mouth 2 (two) times daily. For 10 days   SERTRALINE (ZOLOFT) 50 MG TABLET    Take 1.5 tablets (75 mg total) by mouth daily.  Modified Medications   No medications on file  Discontinued Medications   No medications on file    Physical Exam:  There were no vitals filed for this visit. There is no height or weight on file to calculate BMI. Wt Readings from Last 3 Encounters:  04/21/19 178 lb (80.7 kg)  12/14/18 190 lb (86.2 kg)  12/07/18 187 lb 1.9 oz (84.9 kg)     Labs reviewed: Basic Metabolic Panel: Recent Labs    04/22/19 0112 04/25/19 1110 04/26/19 1045 05/25/19  NA 142 142 144 141  K 4.2 3.6 3.4* 4.1  CL 107 104 105  --   CO2 26 27 28   --   GLUCOSE 127* 117* 114*  --   BUN 14 18 15 12   CREATININE 0.94 0.74 0.76 0.8  CALCIUM 8.4* 8.5* 8.8*  --   TSH  --   --   --  0.35*   Liver Function Tests: No results for input(s): AST, ALT, ALKPHOS, BILITOT, PROT, ALBUMIN in the last 8760 hours. No results for input(s): LIPASE, AMYLASE in the last 8760 hours. No  results for input(s): AMMONIA in the last 8760 hours. CBC: Recent Labs    04/20/19 0456 04/21/19 0747 04/22/19 0112 04/25/19 1110 05/25/19  WBC 10.7* 10.4 9.9 8.3 8.2  NEUTROABS 8.0*  --   --   --  6  HGB 13.6 12.3 10.0* 9.2* 12.0  HCT 43.2 40.0 31.7* 28.9* 38  MCV 94.3 96.2 94.3 93.2  --   PLT 234 185 191 230 283   Lipid Panel: No results for input(s): CHOL, HDL, LDLCALC, TRIG, CHOLHDL, LDLDIRECT in the last 8760 hours. TSH: Recent Labs    05/25/19  TSH 0.35*   A1C: Lab Results  Component Value Date   HGBA1C 5.2 12/10/2017     Assessment/Plan 1. Cough -high risk for aspiration pneumonia, has had a wet cough all day following an event this morning and they have been waiting in the [place] [facility] parking lot for an tele-phone visit, daughter opted for the pt not to be seen at urgent care due to long waits.  - DG Chest 2 View; Future -encouraged to cont aspiration precautions, deep breathing and IS -currently without fever, shortness of breath or purulent sputum.  -to monitor for fever, shortness of breath, distress or worsening confusion and to go to the ED as indicated.  -daughter is aware mother is declining and hospice referral will be next step however she is not ready at this time as she is going through the decline of her father.    Carlos American. Harle Battiest  [facility] & Adult Medicine [phone]    Virtual Visit via Telephone Note  I connected with pt and daughter on 09/08/19 at  3:45 PM  EDT by telephone and verified that I am speaking with the correct person using two identifiers.  Location: Patient: [facility] Provider: clinic   I discussed the limitations, risks, security and privacy concerns of performing an evaluation and management service by telephone and the availability of in person appointments. I also discussed with the patient that there may be a patient responsible charge related to this service. The patient expressed  understanding and agreed to proceed.   I discussed the assessment and treatment plan with the patient. The patient was provided an opportunity to ask questions and all were answered. The patient agreed with the plan and demonstrated an understanding of the instructions.   The patient was advised to call back or seek an in-person evaluation if the symptoms worsen or if the condition fails to improve as anticipated.  I provided 14 minutes of non-face-to-face time during this encounter.  Carlos American. Harle Battiest Avs printed and mailed

## 2019-09-09 ENCOUNTER — Ambulatory Visit (INDEPENDENT_AMBULATORY_CARE_PROVIDER_SITE_OTHER): Payer: Medicare Other | Admitting: Adult Health

## 2019-09-09 ENCOUNTER — Encounter: Payer: Self-pay | Admitting: Adult Health

## 2019-09-09 ENCOUNTER — Other Ambulatory Visit: Payer: Self-pay

## 2019-09-09 DIAGNOSIS — J449 Chronic obstructive pulmonary disease, unspecified: Secondary | ICD-10-CM | POA: Diagnosis not present

## 2019-09-09 DIAGNOSIS — R05 Cough: Secondary | ICD-10-CM | POA: Diagnosis not present

## 2019-09-09 DIAGNOSIS — I5032 Chronic diastolic (congestive) heart failure: Secondary | ICD-10-CM | POA: Diagnosis not present

## 2019-09-09 DIAGNOSIS — S72141D Displaced intertrochanteric fracture of right femur, subsequent encounter for closed fracture with routine healing: Secondary | ICD-10-CM | POA: Diagnosis not present

## 2019-09-09 DIAGNOSIS — N183 Chronic kidney disease, stage 3 (moderate): Secondary | ICD-10-CM | POA: Diagnosis not present

## 2019-09-09 DIAGNOSIS — M545 Low back pain: Secondary | ICD-10-CM | POA: Diagnosis not present

## 2019-09-09 DIAGNOSIS — F0391 Unspecified dementia with behavioral disturbance: Secondary | ICD-10-CM | POA: Diagnosis not present

## 2019-09-09 DIAGNOSIS — R059 Cough, unspecified: Secondary | ICD-10-CM

## 2019-09-09 DIAGNOSIS — I130 Hypertensive heart and chronic kidney disease with heart failure and stage 1 through stage 4 chronic kidney disease, or unspecified chronic kidney disease: Secondary | ICD-10-CM | POA: Diagnosis not present

## 2019-09-09 NOTE — Progress Notes (Signed)
COMMUNITY PALLIATIVE CARE RN NOTE  PATIENT NAME: Virginia [name] DOB: 04/19/1936 MRN: [id number]  PRIMARY CARE PROVIDER: Lauree Chandler, NP  RESPONSIBLE PARTY:  Acct ID - Guarantor Home Phone Work Phone Relationship Acct Type  [id number] [name], [name]* [phone]  Self P/F     [address], [place], [place] [address]   Covid-19 Pre-screening Negative  PLAN OF CARE and INTERVENTION:  1. ADVANCE CARE PLANNING/GOALS OF CARE: Goal is for patient to remain in her home. She is a Full code. 2. PATIENT/CAREGIVER EDUCATION: Safe Mobility/Transfers and Pain Management 3. DISEASE STATUS: Met with patient, daughter, Virginia [name] and hired caregivers in patient's home. Patient was asleep throughout most of visit. She denies pain while she is at rest. Caregivers report that patient does experience pain in her R shoulder when repositioned that has been exacerbated by a recent near fall during a transfer. She is having a shoulder x-ray tomorrow. She also has pain in her R knee. She is unable to have another cortisone injection in both areas until next month. She has had issues with back pain in the past, but caregivers say that she has not been c/o pain recently in this area. She is currently taking Tylenol routinely and has PRN Tramadol available for pain.  Virginia [name] says that it was recommended that patient have a epidural but she is not going to have this done unless patient starts c/o increasing pain. She continues with increased confusion. She is currently on antibiotics for a UTI. She is sleeping more overall. She is starting having issues with swallowing. Her medications are now being crushed and given in chocolate pudding. Her intake is variable. Her pants are fitting more loosely. She has an occasional congested cough. She is on Oxygen at 1.5L/min via [place]. She is using her nebulizer 2-3 times/day. She sometimes has swelling in bilateral legs/feet and R arm. None in legs noted today, but slight swelling in R arm,  but improved since yesterday per daughter. She continues to require 2 person assistance with transfers. She has 2 hired caregivers at all times. Patient is sometimes able to take a few steps with transfers but is still unable to ambulate with her walker. She continues with home health PT/ST/RN. She has a skin tear to her R lower leg and dressing being changed every 3-4 days. Will continue to monitor.   HISTORY OF PRESENT ILLNESS: This is a 83 yo female who resides in her home. Her son and younger brother lives with her, and daughter, Virginia [name] is there to assist with patient needs. Palliative care team continues to follow patient. Next visit in 1 week.    CODE STATUS: Full Code ADVANCED DIRECTIVES: Y MOST FORM: no PPS: 30%   PHYSICAL EXAM:   LUNGS: clear to auscultation  CARDIAC: Cor RRR EXTREMITIES: Slight swelling to R arm/wrist SKIN: R lower leg skin tear, bandage dry and intact  NEURO: Alert and oriented to self, confused, increased generalized weakness, non-ambulatory   (Duration of visit and documentation 90 minutes)    [place]ia Eastern, RN BSN

## 2019-09-09 NOTE — Progress Notes (Signed)
This service is provided via telemedicine  No vital signs collected/recorded due to the encounter was a telemedicine visit.   Location of patient (ex: home, work): Home  Patient consents to a telephone visit:  Yes  Location of the provider (ex: office, home): [employer], Office   Name of any referring provider: Lauree Chandler, NP  Names of all persons participating in the telemedicine service and their role in the encounter:  Lowen Mansouri Medina-Vargas, Chrae B./CMA, patient, and daughter [name].   Time spent on call:  10 min with medical assistant      DATE:  09/09/2019 MRN:  [id number]  BIRTHDAY: 20-Feb-1936   Contact Information    Name Relation Home Work Mobile   Adcock,David Brother [phone]  [phone]   Diers,[name] Daughter [phone]   [place], [place] [phone]  [phone]       Code Status History    Date Active Date Inactive Code Status Order ID Comments User Context   04/20/2019 0936 04/27/2019 1742 Full Code [id number]  Norval Morton, MD Inpatient   02/01/2017 1844 02/03/2017 1544 Full Code [id number]  Samella Parr, NP Inpatient   12/01/2014 0910 12/09/2014 1815 Full Code [id number]  Precious Reel, MD Inpatient   11/30/2014 1208 12/01/2014 0910 Full Code [id number]  Sheela Stack, MD Inpatient   Advance Care Planning Activity       Chief Complaint  Patient presents with  . Follow-up    Follow-up after chest xray, cough and aspitation. Virtual visit     HISTORY OF PRESENT ILLNESS:  This is an 83 year old female who is having a telehealth visit to follow-up chest x-ray done yesterday, 09/08/19. Daughter, [name], was on the phone with her patient in the room. She apparently coughed when she was giving medications yesterday and requested for a chest x-ray. She was reported to have worsening wet cough. She said that her uncle gave her a cookie while lying down and patient coughed several times. Discussed that  patient needs to be sitting upright when food is being given. Chest x-ray was negative for infiltrates. She has no fever, wheezing nor SOB. Daughter reported that home health speech therapist came to see patient twice this week and has given them instructions for safe swallowing. Daughter said that patient is not walking at this time. She has PT once a week. Daughter is aware that dementia patient eventually stops eating and walking. Patient is being followed up by palliative care for 2 years now.    PAST MEDICAL HISTORY:  Past Medical History:  Diagnosis Date  . Anxiety 07/08/2017  . Arthritis   . Carotid artery disease ([place])    a. s/p L CEA. b. followed by [facility].  . Carotid stenosis 10/19/2012  . Chronic diastolic CHF (congestive heart failure) ([place])   . CKD (chronic kidney disease), stage III   . Congestive heart disease ([place])   . COPD (chronic obstructive pulmonary disease) ([place])   . COPD GOLD IV 12/30/2014   PFTs 12/15/14 FEV1  0.57 (29%) ratio 54 p 17% resp to saba    . Cough 01/31/2016  . Dementia with behavioral disturbance ([place]) 07/08/2017  . Diverticulosis   . Dyspnea   . Essential hypertension 12/02/2014  . Fall at home Sept. 2013  . Family history of adverse reaction to anesthesia    SON IS SLOW TO WAKE   . Gastroesophageal reflux disease 07/08/2017  . Hyperlipidemia   . Hypertension   . Hyponatremia 12/02/2014  .  Hypotension 02/01/2017  . Incontinent of urine   . Internal hemorrhoid   . Intertrochanteric fracture ([place]) 04/20/2019  . Memory disorder 10/24/2014  . Nausea and vomiting 11/30/2014  . Primary osteoarthritis involving multiple joints 07/08/2017  . Vitamin D deficiency 02/15/2015     CURRENT MEDICATIONS: Reviewed  Patient's Medications  New Prescriptions   No medications on file  Previous Medications   ACETAMINOPHEN (TYLENOL) 500 MG TABLET    Take 500 mg by mouth 3 (three) times daily as needed.   AMBULATORY NON FORMULARY MEDICATION    1.Home health Nurse to cleanse  right shin area skin tear with saline,pat dry,apply triple antibiotic ointment to wound bed and cover with foam dressing for protection and absorption.change dressing every 3 days and as needed if soiled.Notify provider for any yellow drainage or if wound worsen.  2. Apply foam dressing to left lower leg bruise area for extra protection.   CARBINOXAMINE MALEATE 4 MG TABS    Take 1 tablet (4 mg total) by mouth every 8 (eight) hours as needed.   CHOLECALCIFEROL (D3 SUPER STRENGTH) 2000 UNITS CAPS    Take 1 capsule (2,000 Units total) by mouth daily.   DEXTROMETHORPHAN-GUAIFENESIN (MUCINEX DM) 30-600 MG TB12    TAKE 2 TABLETS BY MOUTH TWICE DAILY   DOCUSATE SODIUM (COLACE) 100 MG CAPSULE    Take 100 mg by mouth at bedtime.   DONEPEZIL (ARICEPT) 10 MG TABLET    TAKE 1 TABLET BY MOUTH EVERY DAY   FUROSEMIDE (LASIX) 20 MG TABLET    Take 1 tablet (20 mg total) by mouth daily.   IPRATROPIUM-ALBUTEROL (DUONEB) 0.5-2.5 (3) MG/3ML SOLN    inhale contents of 1 vial in nebulizer every 6 hours   MEMANTINE (NAMENDA) 10 MG TABLET    TAKE 1 TABLET BY MOUTH TWICE DAILY   ONDANSETRON (ZOFRAN-ODT) 4 MG DISINTEGRATING TABLET    DISSOLVE 1 TABLET ON THE TONGUE IN THE MORNING   PANTOPRAZOLE (PROTONIX) 40 MG TABLET    Take 1 tablet (40 mg total) by mouth 2 (two) times daily.   PROPRANOLOL (INDERAL) 20 MG TABLET    TAKE 1 TABLET(20 MG) BY MOUTH TWICE DAILY   SACCHAROMYCES BOULARDII (FLORASTOR) 250 MG CAPSULE    Take 1 capsule (250 mg total) by mouth 2 (two) times daily. For 10 days   SERTRALINE (ZOLOFT) 50 MG TABLET    Take 1.5 tablets (75 mg total) by mouth daily.  Modified Medications   No medications on file  Discontinued Medications   CIPROFLOXACIN (CIPRO) 500 MG TABLET    Take 1 tablet (500 mg total) by mouth 2 (two) times daily. For 7 days   PREDNISONE (DELTASONE) 10 MG TABLET    Take two tablets (20mg ) twice daily for three days, then one tablet (10mg ) twice daily for three days, then STOP     Allergies   Allergen Reactions  . Lidocaine Anaphylaxis, Swelling, Rash and Other (See Comments)    Any of the " [facility] "  . Other Other (See Comments)    All drugs that end in "cane'-  novacane etc. Daughter states patient almost died when she had it when she was born     REVIEW OF SYSTEMS:  GENERAL: no change in appetite, no fatigue, no fever or chills  MOUTH and THROAT: Denies oral discomfort, gingival pain or bleeding RESPIRATORY: +cough, no SOB, wheezing, hemoptysis CARDIAC: no chest pain, edema or palpitations GI: no abdominal pain, diarrhea, constipation, heart burn, nausea or vomiting GU:  Denies dysuria, frequency, hematuria or discharge NEUROLOGICAL: Denies dizziness, syncope, numbness, or headache PSYCHIATRIC: Denies feeling of depression or anxiety. No report of hallucinations, insomnia, paranoia, or agitation   LABS/RADIOLOGY: Labs reviewed: Basic Metabolic Panel: Recent Labs    04/22/19 0112 04/25/19 1110 04/26/19 1045 05/25/19  NA 142 142 144 141  K 4.2 3.6 3.4* 4.1  CL 107 104 105  --   CO2 26 27 28   --   GLUCOSE 127* 117* 114*  --   BUN 14 18 15 12   CREATININE 0.94 0.74 0.76 0.8  CALCIUM 8.4* 8.5* 8.8*  --    CBC: Recent Labs    04/20/19 0456 04/21/19 0747 04/22/19 0112 04/25/19 1110 05/25/19  WBC 10.7* 10.4 9.9 8.3 8.2  NEUTROABS 8.0*  --   --   --  6  HGB 13.6 12.3 10.0* 9.2* 12.0  HCT 43.2 40.0 31.7* 28.9* 38  MCV 94.3 96.2 94.3 93.2  --   PLT 234 185 191 230 283   Dg Chest 1 View  Result Date: 09/09/2019 CLINICAL DATA:  Productive cough for 1 week EXAM: CHEST  1 VIEW COMPARISON:  04/24/2019 FINDINGS: Cardiac shadow is stable. Aortic calcifications are again seen. Some scarring is noted in the left lung base stable from the prior exam. No focal infiltrate or sizable effusion is seen. IMPRESSION: Chronic changes in the left base.  No acute abnormality noted. Electronically Signed   By: Inez Catalina M.D.   On: 09/09/2019 11:14    ASSESSMENT/PLAN:   1. Cough - chest -ray was negative for infiltrates - instructed to sit patient upright when feeding - continue Mucinex DM, - monitor for fever and SOB, report any yellowish/greenish secretions  2. COPD GOLD IV - no wheezing nor SOB, continue Duoneb   3. Dementia with behavioral disturbance, unspecified dementia type ([place]) - continue Aricept and Namenda, followed up by palliative care     Time spent on non face to face visit:  24 minutes  The patient gave consent to this telephone visit. Explained to the patient the risk and privacy issue that was involved with this telephone call.   The patient was advised to call back and ask for an in-person evaluation if the symptoms worsen or if the condition fails to improve.   Durenda Age, NP [employer] [phone]

## 2019-09-09 NOTE — Patient Instructions (Signed)
-   chest -ray was negative for infiltrates - sit patient upright when feeding - continue Mucinex DM and Duoneb - monitor for fever and shortness of breath, report any yellowish/greenish secretions

## 2019-09-14 ENCOUNTER — Telehealth: Payer: Self-pay | Admitting: Adult Health

## 2019-09-14 DIAGNOSIS — M545 Low back pain: Secondary | ICD-10-CM | POA: Diagnosis not present

## 2019-09-14 DIAGNOSIS — I130 Hypertensive heart and chronic kidney disease with heart failure and stage 1 through stage 4 chronic kidney disease, or unspecified chronic kidney disease: Secondary | ICD-10-CM | POA: Diagnosis not present

## 2019-09-14 DIAGNOSIS — F0391 Unspecified dementia with behavioral disturbance: Secondary | ICD-10-CM | POA: Diagnosis not present

## 2019-09-14 DIAGNOSIS — S72141D Displaced intertrochanteric fracture of right femur, subsequent encounter for closed fracture with routine healing: Secondary | ICD-10-CM | POA: Diagnosis not present

## 2019-09-14 DIAGNOSIS — N183 Chronic kidney disease, stage 3 (moderate): Secondary | ICD-10-CM | POA: Diagnosis not present

## 2019-09-14 DIAGNOSIS — I5032 Chronic diastolic (congestive) heart failure: Secondary | ICD-10-CM | POA: Diagnosis not present

## 2019-09-14 NOTE — Telephone Encounter (Signed)
I have not seen the patient is over 1 year. She will need to schedule an appointment and we can discuss

## 2019-09-14 NOTE — Telephone Encounter (Signed)
Pts daughter [name] is requesting a call to discuss her mother behavior disturbance. Pt hasnt been seen in over a year offered an appt, and she stated that it will be hard to get her in office. She wants to know if it is ok to start back the xanax

## 2019-09-14 NOTE — Telephone Encounter (Signed)
Spoke with the patient's daughter and attempted to schedule an appt for her mother. I advised that Virginia [name] was booked up [facility] November 23, 2019. Patient's daughter became very rude and stated that we were blowing her mother off to primary. I reassured the daughter that im trying to get her mother scheduled. Amy Lomax and Butler Denmark, NP were booked as well. Patient's daughter stated that she would find another neurologist and could we fill her mother's medications for her dementia. I explained that im trying to help. She insisted that we were blowing her off and hung up in my face.
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
## 2019-09-18 NOTE — Progress Notes (Signed)
Virtual Visit via Video Note  I connected with Virginia [name] on 09/19/19 at  1:15 PM EDT by a video enabled telemedicine application and verified that I am speaking with the correct person using two identifiers.  Location: Patient: At her home Provider: In the office    I discussed the limitations of evaluation and management by telemedicine and the availability of in person appointments. The patient expressed understanding and agreed to proceed.  History of Present Illness: 09/19/2019 SS: [name] is an 83 year old female with history of memory disturbance.  She remains on Aricept and Namenda.  She is tolerating the medications well.  She is living at home with different family taking turns caring for her.  She also has in-home caregivers.  She had a fall back in June and fractured her hip and femur.  Since then, she has been unable to ambulate and has required near full assistance.  She also has back and shoulder issues.  She has been working with physical therapy and was able to take a few steps.  She has a good appetite.  Her family is concerned about agitation and aggression she has had with caregivers on occasions when she is exhausted at the end of the day.  There was a time last week where she scratched and hit her caregiver.  This occurred after she had a particularly busy day.  In general she sleeps well at night.  She may have an occasional night terror.  Her family does wonder if her caregivers may be exacerbating the situation by talking loudly or having her do too much when she is already so tired.  She presents today for follow-up via virtual visit accompanied by [name], and daughter Virginia [name].  04/05/2018 MM: Virginia [name] is an 83 year old female with a history of memory disturbance.  She returns today for follow-up.  She is currently on Aricept and Namenda.  She continues to tolerate these medications well.  The patient does not feel that there is been much change in her memory.   She continues to live with her brother.  She does require some assistance with ADLs.  She has an aide that comes in twice a week to help her bathe.  She does not operate a motor vehicle.  She does have assistance with her finances.  Denies any trouble sleeping.  Reports occasionally she will have a vivid dream.  Denies hallucinations.  Denies any changes in her mood or behavior.  She returns today for evaluation.   Observations/Objective: Via virtual visit, is alert, smiling, wearing nasal cannula oxygen, able to follow commands, facial symmetry noted, disoriented to date, month, year, or place.  She was able to tell me her birthday.  Speech is clear, no arm drift, in a wheelchair, gait was not attempted. MOCA-blind was 5/22.  Assessment and Plan: 1.  Memory disturbance  Her memory has declined significantly since last seen. She was alert, pleasant, and participatory during our visit.  She will continue taking Aricept and Namenda.  Due to the report of agitation towards her caregivers, we will increase her Zoloft 100 mg daily.  Her agitation occurs at the end of the day when she is exhausted after a busy day, is not daily.  They will try to get her evening ADLs done earlier, and get her in the bed earlier.  Several months ago, she was given a prescription for Xanax for these issues, that were quite significant at the time.  Fortunately, they  are much improved.  If the increase in Zoloft is not beneficial, we may consider low-dose Seroquel or Risperidal to help with her mood. We may try Risperdal low dose 0.25 or 0.5 mg around 4 pm, hopefully before the agitation sets in during the evening. It would be reasonable to try Xanax low dose in the future as well. We will likely decrease the Zoloft if the increase is not helpful.  She will follow-up in 6 months or sooner if needed.  Follow Up Instructions: 6 months 03/21/2020 1245   I discussed the assessment and treatment plan with the patient. The patient was  provided an opportunity to ask questions and all were answered. The patient agreed with the plan and demonstrated an understanding of the instructions.   The patient was advised to call back or seek an in-person evaluation if the symptoms worsen or if the condition fails to improve as anticipated.  I provided 25 minutes of non-face-to-face time during this encounter.  Evangeline Dakin, DNP  [facility] [address] [place], [place] [address] [phone]

## 2019-09-19 ENCOUNTER — Telehealth (INDEPENDENT_AMBULATORY_CARE_PROVIDER_SITE_OTHER): Payer: Medicare Other | Admitting: Neurology

## 2019-09-19 ENCOUNTER — Encounter: Payer: Self-pay | Admitting: Neurology

## 2019-09-19 DIAGNOSIS — F419 Anxiety disorder, unspecified: Secondary | ICD-10-CM

## 2019-09-19 DIAGNOSIS — F0391 Unspecified dementia with behavioral disturbance: Secondary | ICD-10-CM | POA: Diagnosis not present

## 2019-09-19 MED ORDER — SERTRALINE HCL 50 MG PO TABS: 100.0000 mg | ORAL_TABLET | Freq: Every day | ORAL | 5 refills | Status: DC

## 2019-09-19 NOTE — Progress Notes (Signed)
I have read the note, and I agree with the clinical assessment and plan.  Charles K Willis   

## 2019-09-20 ENCOUNTER — Telehealth: Payer: Self-pay | Admitting: *Deleted

## 2019-09-20 NOTE — Telephone Encounter (Signed)
Communication sent to patient's daughter, [name], to arrange a home visit. Visit scheduled for tomorrow 09/21/19 at 2:30 pm.

## 2019-09-21 ENCOUNTER — Other Ambulatory Visit: Payer: Self-pay

## 2019-09-21 ENCOUNTER — Other Ambulatory Visit: Payer: Medicare Other | Admitting: *Deleted

## 2019-09-21 DIAGNOSIS — N183 Chronic kidney disease, stage 3 (moderate): Secondary | ICD-10-CM | POA: Diagnosis not present

## 2019-09-21 DIAGNOSIS — M545 Low back pain: Secondary | ICD-10-CM | POA: Diagnosis not present

## 2019-09-21 DIAGNOSIS — I130 Hypertensive heart and chronic kidney disease with heart failure and stage 1 through stage 4 chronic kidney disease, or unspecified chronic kidney disease: Secondary | ICD-10-CM | POA: Diagnosis not present

## 2019-09-21 DIAGNOSIS — Z515 Encounter for palliative care: Secondary | ICD-10-CM

## 2019-09-21 DIAGNOSIS — S72141D Displaced intertrochanteric fracture of right femur, subsequent encounter for closed fracture with routine healing: Secondary | ICD-10-CM | POA: Diagnosis not present

## 2019-09-21 DIAGNOSIS — I5032 Chronic diastolic (congestive) heart failure: Secondary | ICD-10-CM | POA: Diagnosis not present

## 2019-09-21 DIAGNOSIS — F0391 Unspecified dementia with behavioral disturbance: Secondary | ICD-10-CM | POA: Diagnosis not present

## 2019-09-21 NOTE — Progress Notes (Signed)
COMMUNITY PALLIATIVE CARE RN NOTE  PATIENT NAME: Virginia [name] DOB: Sep 06, 1936 MRN: [id number]  PRIMARY CARE PROVIDER: Lauree Chandler, NP  RESPONSIBLE PARTY:  Acct ID - Guarantor Home Phone Work Phone Relationship Acct Type  [id number] Virginia [name]  Self P/F     [address], [place], [place] [address]   Covid-19 Pre-screening Negative  PLAN OF CARE and INTERVENTION:  1. ADVANCE CARE PLANNING/GOALS OF CARE: Goal is for patient to remain in her home. She is a Full code. 2. PATIENT/CAREGIVER EDUCATION: Pain Management, Safe Mobility/Transfers 3. DISEASE STATUS: RN visit made to patient's home. Present on visit is [name], brother Virginia Person RN Supervisor Virginia [name], and [name] scheduler Virginia [name]. Upon arrival, patient is sitting up in her recliner awake and alert. She is pleasant and smiling and able to answer simple questions with short replies. She denies pain at this time, however she is at rest. She often has chronic pain in her R shoulder/arm, R knee and back. She is receiving Tylenol scheduled throughout the day. She is intermittently confused. She is agitated and combative at times during personal care. She remains on Oxygen at 1.5L/min via [place]. No coughing noted throughout visit today. She requires 2 [name] assistance with transfers along with 1 [name] assistance with bathing, dressing and at times feeding. She continues with 2 hired caregivers at all times. She is intermittently incontinent of both bowel and bladder. She had a session with PT prior to my visit. Hired caregiver reports that she did have some difficulties during transfer to the bedside commode moving her right leg. Her intake is variable. She is having difficulties swallowing foods, fluids and medications as evidence by patient coughing when swallowing. She had a long coughing spell last pm after taking Tylenol. Recommended that this medication be crushed and given in yogurt/pudding, etc. Speech  therapist has given family instructions to follow during meals. She is an Aspiration risk. Some edema noted to her right arm/hand. It is elevated on pillows. Virginia [name] and daughter requested that I assist with home health care plan and discuss patient/caregiver concerns. Care plan revised and concerns discussed. Will continue to monitor.   HISTORY OF PRESENT ILLNESS:  This is a 83 yo female who resides at home. Palliative care team continues to follow patient. Will continue to visit patient monthly and PRN.  CODE STATUS: Full Code ADVANCED DIRECTIVES: Y MOST FORM: no PPS: 30%   (Duration of visit and documentation 135 minutes)   Virginia Eastern, RN BSN

## 2019-09-23 ENCOUNTER — Ambulatory Visit: Payer: Medicare Other | Admitting: Nurse Practitioner

## 2019-09-28 DIAGNOSIS — I5032 Chronic diastolic (congestive) heart failure: Secondary | ICD-10-CM | POA: Diagnosis not present

## 2019-09-28 DIAGNOSIS — M545 Low back pain: Secondary | ICD-10-CM | POA: Diagnosis not present

## 2019-09-28 DIAGNOSIS — S72141D Displaced intertrochanteric fracture of right femur, subsequent encounter for closed fracture with routine healing: Secondary | ICD-10-CM | POA: Diagnosis not present

## 2019-09-28 DIAGNOSIS — F0391 Unspecified dementia with behavioral disturbance: Secondary | ICD-10-CM | POA: Diagnosis not present

## 2019-09-28 DIAGNOSIS — I130 Hypertensive heart and chronic kidney disease with heart failure and stage 1 through stage 4 chronic kidney disease, or unspecified chronic kidney disease: Secondary | ICD-10-CM | POA: Diagnosis not present

## 2019-09-28 DIAGNOSIS — N183 Chronic kidney disease, stage 3 (moderate): Secondary | ICD-10-CM | POA: Diagnosis not present

## 2019-09-30 DIAGNOSIS — Z85828 Personal history of other malignant neoplasm of skin: Secondary | ICD-10-CM | POA: Diagnosis not present

## 2019-09-30 DIAGNOSIS — Z9181 History of falling: Secondary | ICD-10-CM | POA: Diagnosis not present

## 2019-09-30 DIAGNOSIS — S32030D Wedge compression fracture of third lumbar vertebra, subsequent encounter for fracture with routine healing: Secondary | ICD-10-CM

## 2019-09-30 DIAGNOSIS — M545 Low back pain: Secondary | ICD-10-CM

## 2019-09-30 DIAGNOSIS — I130 Hypertensive heart and chronic kidney disease with heart failure and stage 1 through stage 4 chronic kidney disease, or unspecified chronic kidney disease: Secondary | ICD-10-CM | POA: Diagnosis not present

## 2019-09-30 DIAGNOSIS — R41841 Cognitive communication deficit: Secondary | ICD-10-CM | POA: Diagnosis not present

## 2019-09-30 DIAGNOSIS — R1312 Dysphagia, oropharyngeal phase: Secondary | ICD-10-CM | POA: Diagnosis not present

## 2019-09-30 DIAGNOSIS — N183 Chronic kidney disease, stage 3 unspecified: Secondary | ICD-10-CM

## 2019-09-30 DIAGNOSIS — S72141D Displaced intertrochanteric fracture of right femur, subsequent encounter for closed fracture with routine healing: Secondary | ICD-10-CM | POA: Diagnosis not present

## 2019-09-30 DIAGNOSIS — I5032 Chronic diastolic (congestive) heart failure: Secondary | ICD-10-CM | POA: Diagnosis not present

## 2019-09-30 DIAGNOSIS — M150 Primary generalized (osteo)arthritis: Secondary | ICD-10-CM

## 2019-09-30 DIAGNOSIS — F0391 Unspecified dementia with behavioral disturbance: Secondary | ICD-10-CM | POA: Diagnosis not present

## 2019-10-05 ENCOUNTER — Other Ambulatory Visit: Payer: Self-pay

## 2019-10-05 ENCOUNTER — Other Ambulatory Visit: Payer: Self-pay | Admitting: *Deleted

## 2019-10-05 DIAGNOSIS — S72141D Displaced intertrochanteric fracture of right femur, subsequent encounter for closed fracture with routine healing: Secondary | ICD-10-CM | POA: Diagnosis not present

## 2019-10-05 DIAGNOSIS — I130 Hypertensive heart and chronic kidney disease with heart failure and stage 1 through stage 4 chronic kidney disease, or unspecified chronic kidney disease: Secondary | ICD-10-CM | POA: Diagnosis not present

## 2019-10-05 DIAGNOSIS — F0391 Unspecified dementia with behavioral disturbance: Secondary | ICD-10-CM | POA: Diagnosis not present

## 2019-10-05 DIAGNOSIS — I5032 Chronic diastolic (congestive) heart failure: Secondary | ICD-10-CM | POA: Diagnosis not present

## 2019-10-05 DIAGNOSIS — S32030D Wedge compression fracture of third lumbar vertebra, subsequent encounter for fracture with routine healing: Secondary | ICD-10-CM | POA: Diagnosis not present

## 2019-10-05 DIAGNOSIS — N183 Chronic kidney disease, stage 3 unspecified: Secondary | ICD-10-CM | POA: Diagnosis not present

## 2019-10-05 MED ORDER — DONEPEZIL HCL 10 MG PO TABS: 10.0000 mg | ORAL_TABLET | Freq: Every day | ORAL | 5 refills | Status: DC

## 2019-10-05 MED ORDER — FUROSEMIDE 20 MG PO TABS: 20.0000 mg | ORAL_TABLET | Freq: Every day | ORAL | 1 refills | Status: DC

## 2019-10-06 DIAGNOSIS — M1711 Unilateral primary osteoarthritis, right knee: Secondary | ICD-10-CM | POA: Diagnosis not present

## 2019-10-19 ENCOUNTER — Telehealth: Payer: Self-pay | Admitting: Internal Medicine

## 2019-10-19 ENCOUNTER — Other Ambulatory Visit: Payer: Self-pay | Admitting: Nurse Practitioner

## 2019-10-19 ENCOUNTER — Telehealth: Payer: Self-pay | Admitting: Nurse Practitioner

## 2019-10-19 NOTE — Telephone Encounter (Signed)
PT(Virginia [name]) with [facility] has noticed an increase of more congestion than last week.  He felt with her history of pneumonia, that this was urgent! Virginia [name]# [id number]  Thanks,  Lattie Haw

## 2019-10-19 NOTE — Telephone Encounter (Signed)
Would recommend her making an appt to be seen in office

## 2019-10-19 NOTE — Telephone Encounter (Signed)
Patient not seen since 05/2017 and wants more zofran. Do I fill or should I have her do phone visit? She is medicare and would fall within the 3 year range for [place] to be able to do this.Marland KitchenMarland Kitchen

## 2019-10-19 NOTE — Telephone Encounter (Signed)
Seen patient did not have follow up appointment and had not been seen in a while. Called patient's brother and discussed need for follow up appointment. He was in agreement and scheduled appointment for patient.

## 2019-10-19 NOTE — Telephone Encounter (Signed)
I called Virginia [name] back and he stated that patient has Increased Congestion with History of Pneumonia. Has a cough Non productive. He cannot get her to take a deep enough breath to hear her lungs. Symptoms are worse than last week. Having Cognitive and Processing Issues.  Vitals: T 97.8, P 72 BP 118/68 Spo2 in the 90's  Please Advise.  Tried Calling [name], daughter but no answer and voicemail is full.

## 2019-10-20 ENCOUNTER — Telehealth: Payer: Self-pay | Admitting: Internal Medicine

## 2019-10-20 ENCOUNTER — Other Ambulatory Visit: Payer: Self-pay

## 2019-10-20 ENCOUNTER — Other Ambulatory Visit: Payer: Medicare Other | Admitting: *Deleted

## 2019-10-20 ENCOUNTER — Telehealth: Payer: Self-pay

## 2019-10-20 DIAGNOSIS — Z515 Encounter for palliative care: Secondary | ICD-10-CM

## 2019-10-20 MED ORDER — ONDANSETRON 4 MG PO TBDP: ORAL_TABLET | ORAL | 1 refills | Status: DC

## 2019-10-20 NOTE — Telephone Encounter (Signed)
Ok to refill, nonurgent telephone or virtual visit is recommended for continuity

## 2019-10-20 NOTE — Telephone Encounter (Signed)
Daughter, [name] agreed. Appointment scheduled with Dinah 10/21/2019

## 2019-10-20 NOTE — Telephone Encounter (Signed)
Spoke with patient's daughter, [name], who states she thought patient had to have lab work tomorrow, but since she left the message she found out that patient is going to have a virtual appointment instead of clinic appointment. She states patient may need lab work soon for her PCP or palliative care doctors and daughter wants all lab work done here because Lanny Hurst is the only person that does not hurt patient when she is getting blood drawn. She asks if I can send a message to Dr. Harrington Challenger to find out if any lab work is needed from her; daughter also states they are agreeable to do a virtual visit with Dr. Harrington Challenger if needed. I advised that I will forward message to Dr. Harrington Challenger and in the meantime if patient has any lab work that is needed urgently and we have not called her back yet, to call back. [name] verbalized understanding and agreement and thanked me for the call.

## 2019-10-20 NOTE — Telephone Encounter (Signed)
Patient is scheduled for telephone visit on 12/07/19. Brother indicates that he will be available for this visit (patient has advanced Alzheimers). Zofran refills sent until this visit.

## 2019-10-20 NOTE — Telephone Encounter (Signed)
New Message:      Daughter wants to know if pt can have lab work here tomorrow. She said it is so hard for anybody to draw her lab anywhere else. Lanny Hurst, here does a great job getting it. She would like an order placed here for tomorrow, so when she comes out tomorrow for another appt somewhere else, she can get it all did tomorrow please. She wants all the lab work that Dr Harrington Challenger usually order. She has swelling in her right arm. The Pallative Nurse  is not sure if is arthritidis or kidney.

## 2019-10-20 NOTE — Progress Notes (Signed)
COMMUNITY PALLIATIVE CARE RN NOTE  PATIENT NAME: Virginia [name] DOB: 1936-11-19 MRN: [id number]  PRIMARY CARE PROVIDER: Lauree Chandler, NP  RESPONSIBLE PARTY: Virginia [name] (daughter) Acct ID - Guarantor Home Phone Work Phone Relationship Acct Type  [id number] IRENE, COLLINGS* [phone]  Self P/F     [address], [place], [place] [address]   Covid-19 Pre-screening Negative  PLAN OF CARE and INTERVENTION:  1. ADVANCE CARE PLANNING/GOALS OF CARE: Goal is for patient to remain in her home. She is a Full code. 2. PATIENT/CAREGIVER EDUCATION: Respiratory Management, Pain Management, Safe Transfers/Mobility 3. DISEASE STATUS: Met with patient and her daughter, Virginia [name], in her home. Upon arrival, patient is lying in bed asleep. Easily aroused with verbal stimulation. Remains intermittently confused, but able to answer simple questions with short replies. Virginia [name] is concerned that patient may have Pneumonia. Patient has been diagnosed with pneumonia twice this year and placed on antibiotics. She had a PT session yesterday, who listened to patient's lungs and heard some congestion and contacted her PCPs office. In-person visit was scheduled for 10/21/19 at 2:45pm. Patient has seemed to be more tired and did not want to get out of bed today until 2:00 pm. She also would not take her medications this am. Assessment performed in patient's room while she is still in bed. She is afebrile, but she is also given Tylenol 500 mg TID routinely. Cough nonproductive during visit. Crackles noted in all lung fields. Her blood pressure is elevated at 171/94. She did agree to take her medications at this time, which are given crushed in pudding. Her intake remains variable, but she does cough at times on thin liquids or food. She is on continuous Oxygen at 1.5L/min via [place]. She continues to become agitated at times during personal care. Staff must re-approach at a later time. She is total care for all ADLs. She is  incontinent of both bowel and bladder. Swelling noted to her right arm, and is kept elevated on a pillow. She recently went to her Ortho MD for a steroid injection, but daughter states he did not give her the injection because he does not feel she should have one on a monthly basis. It was recommended they bring her back in 6 weeks to assess her right knee. Daughter is concerned about patient going out to see her PCP. It is becoming more difficult to get patient out of the home. She requires 2 person assistance with transfers and she has to arrange/hire transportation. Daughter says this takes a toll on patient. Contacted Palliative care NP, Violeta Gelinas. After providing her with an assessment, she ordered patient a Z-pak for suspected respiratory infection. I called [facility] and ordered antibiotics and made daughter aware. Also, received a call from PCPs office that they are changing patient's visit tomorrow from in-person to telephonic. Made daughter aware of this also. Will continue to monitor.  HISTORY OF PRESENT ILLNESS:  This is a 83 yo female who resides in her home. Her daughter, son and brother currently live with patient to assist with personal care. She also has hired caregivers through both [place] from 9a-9p. Palliative care team continues to follow patient. Will continue to visit patient monthly and PRN.  CODE STATUS: Full Code  ADVANCED DIRECTIVES: Y MOST FORM: no PPS: 30%   PHYSICAL EXAM:   VITALS: Today's Vitals   10/20/19 1350  BP: (!) 171/94  Pulse: 77  Resp: 20  Temp: 97.9 F (36.6 C)  TempSrc: Temporal  SpO2: 96%  PainSc: 0-No pain    LUNGS: scattered rales bilaterally CARDIAC: Cor RRR EXTREMITIES: Swelling noted to right arm/hand SKIN: Exposed skin is dry and intact, dressing to right lower leg is dry and intact (managed by [facility] RN)  NEURO: Alert and oriented to self, intermittently confused, generalized weakness,  non-ambulatory   (Duration of visit and documentation 90 minutes)   [name], RN BSN

## 2019-10-20 NOTE — Telephone Encounter (Signed)
Cannot diagnoses pneumonia without confirming with chest X-ray.

## 2019-10-20 NOTE — Telephone Encounter (Signed)
Virginia [name] from Palliative Care called.  She stated that she called her NP and she prescribed a Z-Pak for her today to cover the possibility of pneumonia.  The goal is to keep the patient at home if at all possible. She was scheduled for an office visit, but it has been changed to a televisit.  [name] was wanting to know if you could diagnose pneumonia without doing a chest x-ray.    Just a FYI.

## 2019-10-21 ENCOUNTER — Ambulatory Visit (INDEPENDENT_AMBULATORY_CARE_PROVIDER_SITE_OTHER): Payer: Medicare Other | Admitting: Family

## 2019-10-21 ENCOUNTER — Other Ambulatory Visit: Payer: Self-pay

## 2019-10-21 ENCOUNTER — Encounter: Payer: Self-pay | Admitting: Family

## 2019-10-21 DIAGNOSIS — M25511 Pain in right shoulder: Secondary | ICD-10-CM

## 2019-10-21 DIAGNOSIS — R609 Edema, unspecified: Secondary | ICD-10-CM

## 2019-10-21 DIAGNOSIS — E0590 Thyrotoxicosis, unspecified without thyrotoxic crisis or storm: Secondary | ICD-10-CM | POA: Diagnosis not present

## 2019-10-21 DIAGNOSIS — R05 Cough: Secondary | ICD-10-CM | POA: Diagnosis not present

## 2019-10-21 DIAGNOSIS — E559 Vitamin D deficiency, unspecified: Secondary | ICD-10-CM | POA: Diagnosis not present

## 2019-10-21 DIAGNOSIS — E782 Mixed hyperlipidemia: Secondary | ICD-10-CM | POA: Diagnosis not present

## 2019-10-21 DIAGNOSIS — M159 Polyosteoarthritis, unspecified: Secondary | ICD-10-CM

## 2019-10-21 DIAGNOSIS — N1832 Chronic kidney disease, stage 3b: Secondary | ICD-10-CM | POA: Diagnosis not present

## 2019-10-21 DIAGNOSIS — R059 Cough, unspecified: Secondary | ICD-10-CM

## 2019-10-21 DIAGNOSIS — I10 Essential (primary) hypertension: Secondary | ICD-10-CM

## 2019-10-21 DIAGNOSIS — M8949 Other hypertrophic osteoarthropathy, multiple sites: Secondary | ICD-10-CM | POA: Diagnosis not present

## 2019-10-21 MED ORDER — SACCHAROMYCES BOULARDII 250 MG PO CAPS: 250.0000 mg | ORAL_CAPSULE | Freq: Two times a day (BID) | ORAL | 0 refills | Status: AC

## 2019-10-21 NOTE — Telephone Encounter (Signed)
Not sure when I am due to see her  Recomm:   CBC, BMET , TSH, free T3, free T4   Hospice may be able to draw

## 2019-10-21 NOTE — Progress Notes (Signed)
This service is provided via telemedicine  No vital signs collected/recorded due to the encounter was a telemedicine visit.   Location of patient (ex: home, work):  Home   Patient consents to a telephone visit:  Yes  Location of the provider (ex: office, home):  Office   Name of any referring provider:  Sherrie Mustache, NP  Names of all persons participating in the telemedicine service and their role in the encounter:    NP, Ruthell Rummage CMA, [name] (daughter), Keajah Killough   Time spent on call:  Ruthell Rummage CMA spent 13 minutes on phone with patient.   Provider:   FNP-C  Lauree Chandler, NP  Patient Care Team: Lauree Chandler, NP as PCP - General (Nurse Practitioner) Fay Records, MD as PCP - Cardiology (Cardiology) Tanda Rockers, MD as Consulting Physician (Pulmonary Disease) Conan Bowens, RN as Registered Nurse [facility] and Palliative Medicine) Pyrtle, Lajuan Lines, MD as Consulting Physician (Gastroenterology)  Extended Emergency Contact Information Primary Emergency Contact: Adcock,David Address: [address] [place]          [place], [place] [address] Johnnette Litter of [place] Phone: [phone] Mobile Phone: [phone] Relation: Brother Secondary Emergency Contact: Eliseo Gum [place] of [place] Phone: [phone] Work Phone: [phone] Mobile Phone: [phone] Relation: Daughter  Code Status: Full Code  Goals of care: Advanced Directive information Advanced Directives 06/28/2019  Does Patient Have a Medical Advance Directive? Yes  Type of Advance Directive [place]  Does patient want to make changes to medical advance directive? Yes (MAU/Ambulatory/Procedural Areas - Information given)  Copy of [place] in Chart? No - copy requested  Would patient like information on creating a medical advance directive? -     Chief Complaint  Patient  presents with  . Acute Visit    Patient's daughter states patient has had congestion on going but Wednesday it got worse. States patient's hands and left leg is very swollen. Tried to arrange labs with cardiologist but nothing. Daughter would like to have labs drawn on patient but states she would like kidney specialist to preform if possible. Daughter is very concerned with renal functions and swelling.     HPI:  Pt is a 83 y.o. female seen today for an acute visit for evaluation of cough and congestion on going but got worst on Wednesday 10/19/2019.states Home health Nurse and Physical therapy noticed bilateral lung crackles.she state Z-pak was ordered by PCP feeling much better today.No fever,chills or shortness of breath.  Patient's daughter states patient's hands and left leg is very swollen.she states patient keeps legs elevated when seated.she states physical Therapy recommended glove for edema.Patient's daughter would like glove to be ordered.   She also tried to arrange labs with cardiologist but nothing.Daughter would like to have labs drawn on patient but states she would like kidney specialist to preform if possible.she request labs to be ordered to be drawn on next visit with specialist. Daughter is very concerned with renal functions and swelling.   Past Medical History:  Diagnosis Date  . Anxiety 07/08/2017  . Arthritis   . Carotid artery disease ([place])    a. s/p L CEA. b. followed by [facility].  . Carotid stenosis 10/19/2012  . Chronic diastolic CHF (congestive heart failure) ([place])   . CKD (chronic kidney disease), stage III   . Congestive heart disease ([place])   . COPD (chronic obstructive pulmonary  disease) ([place])   . COPD GOLD IV 12/30/2014   PFTs 12/15/14 FEV1  0.57 (29%) ratio 54 p 17% resp to saba    . Cough 01/31/2016  . Dementia with behavioral disturbance ([place]) 07/08/2017  . Diverticulosis   . Dyspnea   . Essential hypertension 12/02/2014  . Fall at home Sept. 2013  . Family history  of adverse reaction to anesthesia    SON IS SLOW TO WAKE   . Gastroesophageal reflux disease 07/08/2017  . Hyperlipidemia   . Hypertension   . Hyponatremia 12/02/2014  . Hypotension 02/01/2017  . Incontinent of urine   . Internal hemorrhoid   . Intertrochanteric fracture ([place]) 04/20/2019  . Memory disorder 10/24/2014  . Nausea and vomiting 11/30/2014  . Primary osteoarthritis involving multiple joints 07/08/2017  . Vitamin D deficiency 02/15/2015   Past Surgical History:  Procedure Laterality Date  . APPENDECTOMY    . BREAST REDUCTION SURGERY    . CAROTID ENDARTERECTOMY     left CEA  . CATARACT EXTRACTION Bilateral   . COLONOSCOPY  2015  . EYE SURGERY     cataracts removed, bilaterally   . INTRAMEDULLARY (IM) NAIL INTERTROCHANTERIC Right 04/21/2019   Procedure: INTRAMEDULLARY (IM) NAIL INTERTROCHANTRIC;  Surgeon: Rod Can, MD;  Location: [facility];  Service: Orthopedics;  Laterality: Right;  . skin cancer removal    . TONSILLECTOMY      Allergies  Allergen Reactions  . Lidocaine Anaphylaxis, Swelling, Rash and Other (See Comments)    Any of the " [facility] "  . Other Other (See Comments)    All drugs that end in "cane'-  novacane etc. Daughter states patient almost died when she had it when she was born    Outpatient Encounter Medications as of 10/21/2019  Medication Sig  . acetaminophen (TYLENOL) 500 MG tablet Take 500 mg by mouth 3 (three) times daily as needed.  . AMBULATORY NON FORMULARY MEDICATION 1.Home health Nurse to cleanse right shin area skin tear with saline,pat dry,apply triple antibiotic ointment to wound bed and cover with foam dressing for protection and absorption.change dressing every 3 days and as needed if soiled.Notify provider for any yellow drainage or if wound worsen.  2. Apply foam dressing to left lower leg bruise area for extra protection.  . Carbinoxamine Maleate 4 MG TABS Take 1 tablet (4 mg total) by mouth every 8 (eight) hours as needed.  .  Cholecalciferol (D3 SUPER STRENGTH) 2000 units CAPS Take 1 capsule (2,000 Units total) by mouth daily.  Marland Kitchen Dextromethorphan-guaiFENesin (MUCINEX DM) 30-600 MG TB12 TAKE 2 TABLETS BY MOUTH TWICE DAILY  . donepezil (ARICEPT) 10 MG tablet Take 1 tablet (10 mg total) by mouth daily.  . furosemide (LASIX) 20 MG tablet Take 1 tablet (20 mg total) by mouth daily.  Marland Kitchen ipratropium-albuterol (DUONEB) 0.5-2.5 (3) MG/3ML SOLN inhale contents of 1 vial in nebulizer every 6 hours  . memantine (NAMENDA) 10 MG tablet TAKE 1 TABLET BY MOUTH TWICE DAILY  . ondansetron (ZOFRAN-ODT) 4 MG disintegrating tablet Take 4 mg by mouth as needed for nausea or vomiting.  . pantoprazole (PROTONIX) 40 MG tablet TAKE 1 TABLET(40 MG) BY MOUTH TWICE DAILY  . propranolol (INDERAL) 20 MG tablet TAKE 1 TABLET(20 MG) BY MOUTH TWICE DAILY  . sertraline (ZOLOFT) 50 MG tablet Take 2 tablets (100 mg total) by mouth daily.  . [DISCONTINUED] docusate sodium (COLACE) 100 MG capsule Take 100 mg by mouth at bedtime.  . [DISCONTINUED] ondansetron (ZOFRAN-ODT) 4 MG disintegrating  tablet DISSOLVE 1 TABLET ON THE TONGUE IN THE MORNING (Patient taking differently: as needed. DISSOLVE 1 TABLET ON THE TONGUE IN THE MORNING)  . [DISCONTINUED] saccharomyces boulardii (FLORASTOR) 250 MG capsule Take 1 capsule (250 mg total) by mouth 2 (two) times daily. For 10 days   No facility-administered encounter medications on file as of 10/21/2019.     Review of Systems  Constitutional: Negative for appetite change, chills, fatigue, fever and unexpected weight change.  HENT: Positive for rhinorrhea. Negative for congestion, postnasal drip, sinus pressure, sinus pain, sneezing, sore throat and trouble swallowing.   Eyes: Negative for discharge, redness and itching.  Respiratory: Positive for cough and shortness of breath. Negative for chest tightness and wheezing.   Cardiovascular: Positive for leg swelling. Negative for chest pain and palpitations.   Gastrointestinal: Negative for abdominal distention, abdominal pain, constipation, diarrhea, nausea and vomiting.  Endocrine: Negative for cold intolerance, heat intolerance, polydipsia, polyphagia and polyuria.  Musculoskeletal: Positive for gait problem.  Skin: Negative for color change, pallor and rash.  Neurological: Negative for dizziness, light-headedness and headaches.  Hematological: Does not bruise/bleed easily.  Psychiatric/Behavioral: Negative for agitation and sleep disturbance. The patient is not nervous/anxious.     Immunization History  Administered Date(s) Administered  . Fluad Quad(high Dose 65+) 08/03/2019  . Influenza Split 08/01/2014  . Influenza, High Dose Seasonal PF 09/08/2017, 09/01/2018  . Influenza,inj,Quad PF,6+ Mos 08/16/2015, 08/15/2016  . Pneumococcal Conjugate-13 03/30/2017  . Pneumococcal-Unspecified 08/01/2014  . Tdap 01/16/2018   Pertinent  Health Maintenance Due  Topic Date Due  . INFLUENZA VACCINE  Completed  . DEXA SCAN  Completed  . PNA vac Low Risk Adult  Completed   Fall Risk  10/21/2019 08/03/2019 06/28/2019 05/30/2019 05/20/2019  Falls in the past year? _0 0  Number falls in past yr: 1 0 1 1 0  Injury with Fall? _1 0  Comment - - - - -  Risk for fall due to : - - History of fall(s);Impaired balance/gait;Impaired mobility History of fall(s);Impaired balance/gait -    There were no vitals filed for this visit. There is no height or weight on file to calculate BMI. Physical Exam Vitals signs reviewed.  Constitutional:      General: She is not in acute distress.    Appearance: She is not ill-appearing.  HENT:     Mouth/Throat:     Mouth: Mucous membranes are moist.     Pharynx: No oropharyngeal exudate.  Eyes:     General: No scleral icterus.       Right eye: No discharge.        Left eye: No discharge.     Conjunctiva/sclera: Conjunctivae normal.  Musculoskeletal:     Right lower leg: Edema present.     Left lower leg:  Edema present.     Comments: Requires assistance with transfer   Skin:    Coloration: Skin is not pale.     Findings: No bruising or rash.     Comments: Right hand swollen without any redness noted.   Neurological:     Mental Status: She is alert. Mental status is at baseline.     Gait: Gait abnormal.  Psychiatric:        Mood and Affect: Mood normal.        Behavior: Behavior normal.        Thought Content: Thought content normal.        Judgment: Judgment normal.  Labs reviewed: Recent Labs    04/22/19 0112 04/25/19 1110 04/26/19 1045 05/25/19  NA 142 142 144 141  K 4.2 3.6 3.4* 4.1  CL 107 104 105  --   CO2 _0 --   GLUCOSE 127* 117* 114*  --   BUN _1 CREATININE 0.94 0.74 0.76 0.8  CALCIUM 8.4* 8.5* 8.8*  --     Recent Labs    04/20/19 0456 04/21/19 0747 04/22/19 0112 04/25/19 1110 05/25/19  WBC 10.7* 10.4 9.9 8.3 8.2  NEUTROABS 8.0*  --   --   --  6  HGB 13.6 12.3 10.0* 9.2* 12.0  HCT 43.2 40.0 31.7* 28.9* 38  MCV 94.3 96.2 94.3 93.2  --   PLT 234 185 191 230 283   Lab Results  Component Value Date   TSH 0.35 (A) 05/25/2019   Lab Results  Component Value Date   HGBA1C 5.2 12/10/2017   Lab Results  Component Value Date   CHOL 189 06/21/2018   HDL 58 06/21/2018   LDLCALC 103 (H) 06/21/2018   LDLDIRECT 142.0 05/24/2015   TRIG 140 06/21/2018   CHOLHDL 3.3 06/21/2018    Significant Diagnostic Results in last 30 days:  No results found.  Assessment/Plan 1. Cough Afebrile.Has improved with Z-Pak.Need a Probiotic ordered. - saccharomyces boulardii (FLORASTOR) 250 MG capsule; Take 1 capsule (250 mg total) by mouth 2 (two) times daily for 10 days.  Dispense: 20 capsule; Refill: 0  2. Right shoulder pain, unspecified chronicity Request second opinion with another Orthopedic.would like referral to Ortho Care.  - Ambulatory referral to Orthopedic Surgery  3. Primary osteoarthritis involving multiple joints Worst on right knee  request referral to Ortho Care for a second opinion on her treatment.continue on Tylenol 500 mg tablet three times daily as needed.  - Ambulatory referral to Orthopedic Surgery  4. Essential hypertension SB/p elevated with Home health Nurse suspected due to her cough and congestion .will continue to monitor for now. - CBC with Differential/Platelet; Future - CMP with eGFR([facility]); Future  5. Subclinical hyperthyroidism Lab Results  Component Value Date   TSH 0.35 (A) 05/25/2019  Daughter request labs to be drawn during speacilist visit. - TSH; Future - T3 - T4  6. Stage 3b chronic kidney disease CR stable.encouraged to avoid NSAID's. CMP with eGFR([facility]); Future  7. Vitamin D deficiency Continue on Vit d supplement.  - Vitamin D, 1,25-dihydroxy; Future  8. Mixed hyperlipidemia Continue on heart healthy diet.exercise limited due to Osteoarthritis pain. - Lipid panel; Future - Vitamin D, 1,25-dihydroxy; Future  9.Edema  Worst on right hand and right leg continue on furosemide 20 mg tablet daily.Physical Therapy request compression stocking/glove.    Family/ staff Communication: Reviewed plan of care with patient and Daughter   Labs/tests ordered:  - CBC with Differential/Platelet; Future - CMP with eGFR([facility]); Future - Vitamin D, 1,25-dihydroxy; Future - Lipid panel; Future - TSH; Future - T3 - T4 Spent 25 minutes of face to face video with patient and daughter.  Next Appointment: 11/07/2019 for medical management of chronic issues  Sandrea Hughs, NP I

## 2019-10-23 MED ORDER — UNABLE TO FIND: 0 refills | Status: DC

## 2019-10-24 ENCOUNTER — Telehealth: Payer: Self-pay

## 2019-10-24 NOTE — Telephone Encounter (Signed)
If patient's condition is not getting any better or worsening I recommend sending to the ED.

## 2019-10-24 NOTE — Telephone Encounter (Signed)
Virginia [name] needs an in person evaluation. If she was not able to get to the office she can also go to urgent care or emergency depending on her condition. In person evaluation recommended.

## 2019-10-24 NOTE — Telephone Encounter (Signed)
Called provider to discuss need for number to fax orders for right hand compression glove. Daughter still working on getting name of place to fax orders to. Daughter went on to state patient's condition has not gotten better and is getting worse. Daughter is apprehensive about taking patient to ER was told there was 18 hr wait and patient could be exposed to [place] patients. Daughter went on to inquire about on call providers during holitday if anyone would be available. Informed daughter there was providers available.   Daughter would like to ask provider what she needs to do? States patient has not gotten any better. Please advise.

## 2019-10-25 NOTE — Telephone Encounter (Signed)
Spoke to daughter again to discuss providers recommendations. Daughter stated she is not interested taking her anywhere to have her exposed to covid. She did say she feels patient is getting some better. She went on to ask if she was to call one of our on call providers would she be able to get medication for patient. Discussed with patient she would be able to speak with providers on call but I could not say if medication would be given.   Daughter states she will call back when she gets information from PT on where to fax orders.   She denied further questions.

## 2019-10-25 NOTE — Telephone Encounter (Signed)
Left detailed message (DPR). Adv that they can call back and arrange either virtual or in office visit with Dr. Harrington Challenger at patient's convenience. Also if there are labs that are needed that we can arrange for them to be drawn here as requested.  Dr. Harrington Challenger will order them.

## 2019-10-26 ENCOUNTER — Telehealth: Payer: Self-pay | Admitting: Internal Medicine

## 2019-10-26 ENCOUNTER — Telehealth: Payer: Self-pay | Admitting: *Deleted

## 2019-10-26 DIAGNOSIS — E559 Vitamin D deficiency, unspecified: Secondary | ICD-10-CM

## 2019-10-26 DIAGNOSIS — E0590 Thyrotoxicosis, unspecified without thyrotoxic crisis or storm: Secondary | ICD-10-CM

## 2019-10-26 DIAGNOSIS — E782 Mixed hyperlipidemia: Secondary | ICD-10-CM

## 2019-10-26 DIAGNOSIS — I10 Essential (primary) hypertension: Secondary | ICD-10-CM

## 2019-10-26 NOTE — Telephone Encounter (Signed)
New Message    [name] is calling and is wanting to know if Dr Harrington Challenger can order the labs that are currently being ordered by another provider.  She would like the [facility] to be [facility] because she says Lanny Hurst can draw the pts blood well.    Please call

## 2019-10-26 NOTE — Telephone Encounter (Signed)
Called daughter today to follow up on where she would like prescription for compression glove faxed. She stated that right now she is unsure if patient will need prescription. She has called and got patient's labs switched to [facility] by cardiologist and has an appointment for labs with cardiologist next Wednesday.  However labs will not be fasting.  She also went on to state she has made an appointment with orthopedic to have patient hand looked at along with measurements. She feels they maybe able to write a prescription there for whatever patient may need. Daughter reported PT came today and listened to patient's lungs and lungs sounded clear.

## 2019-10-26 NOTE — Telephone Encounter (Signed)
Per phone note from 10/20/19, Dr. Harrington Challenger okay with ordering lab work. Patient will come in on 11/02/19 for lab work. Will need to also send results to Marlowe Sax, NP.

## 2019-10-26 NOTE — Telephone Encounter (Signed)
Virginia Barter, RN     10/26/19 12:37 PM Note   Per phone note from 10/20/19, Dr. Harrington Challenger okay with ordering lab work. Patient will come in on 11/02/19 for lab work. Will need to also send results to Marlowe Sax, NP.

## 2019-10-26 NOTE — Telephone Encounter (Signed)
Daughter calling back requesting lab orders be done under [facility] instead of [facility], also daughter stated that she will not starve pt for the lipid panel and want that one canceled.

## 2019-10-26 NOTE — Telephone Encounter (Signed)
Thank You for the Update Virginia [name].

## 2019-10-26 NOTE — Telephone Encounter (Signed)
We do not have [facility] in our office. If she would like to eat we can still obtain lipid panel to get a general idea of her cholesterol. If she still does not wish for [place] to obtain this we can dc order

## 2019-10-30 DIAGNOSIS — S72141D Displaced intertrochanteric fracture of right femur, subsequent encounter for closed fracture with routine healing: Secondary | ICD-10-CM | POA: Diagnosis not present

## 2019-10-30 DIAGNOSIS — M150 Primary generalized (osteo)arthritis: Secondary | ICD-10-CM | POA: Diagnosis not present

## 2019-10-30 DIAGNOSIS — I130 Hypertensive heart and chronic kidney disease with heart failure and stage 1 through stage 4 chronic kidney disease, or unspecified chronic kidney disease: Secondary | ICD-10-CM | POA: Diagnosis not present

## 2019-10-30 DIAGNOSIS — Z85828 Personal history of other malignant neoplasm of skin: Secondary | ICD-10-CM | POA: Diagnosis not present

## 2019-10-30 DIAGNOSIS — F0391 Unspecified dementia with behavioral disturbance: Secondary | ICD-10-CM | POA: Diagnosis not present

## 2019-10-30 DIAGNOSIS — Z9181 History of falling: Secondary | ICD-10-CM | POA: Diagnosis not present

## 2019-10-30 DIAGNOSIS — I5032 Chronic diastolic (congestive) heart failure: Secondary | ICD-10-CM | POA: Diagnosis not present

## 2019-10-30 DIAGNOSIS — M545 Low back pain: Secondary | ICD-10-CM | POA: Diagnosis not present

## 2019-10-30 DIAGNOSIS — R1312 Dysphagia, oropharyngeal phase: Secondary | ICD-10-CM | POA: Diagnosis not present

## 2019-10-30 DIAGNOSIS — S32030D Wedge compression fracture of third lumbar vertebra, subsequent encounter for fracture with routine healing: Secondary | ICD-10-CM | POA: Diagnosis not present

## 2019-10-30 DIAGNOSIS — N183 Chronic kidney disease, stage 3 unspecified: Secondary | ICD-10-CM | POA: Diagnosis not present

## 2019-10-30 DIAGNOSIS — R41841 Cognitive communication deficit: Secondary | ICD-10-CM | POA: Diagnosis not present

## 2019-11-01 ENCOUNTER — Ambulatory Visit: Payer: Medicare Other | Admitting: Orthopaedic Surgery

## 2019-11-02 ENCOUNTER — Ambulatory Visit (INDEPENDENT_AMBULATORY_CARE_PROVIDER_SITE_OTHER): Payer: Medicare Other

## 2019-11-02 ENCOUNTER — Ambulatory Visit (INDEPENDENT_AMBULATORY_CARE_PROVIDER_SITE_OTHER): Payer: Medicare Other | Admitting: Orthopaedic Surgery

## 2019-11-02 ENCOUNTER — Other Ambulatory Visit: Payer: Medicare Other | Admitting: *Deleted

## 2019-11-02 ENCOUNTER — Ambulatory Visit: Payer: Self-pay

## 2019-11-02 ENCOUNTER — Encounter: Payer: Self-pay | Admitting: Orthopaedic Surgery

## 2019-11-02 ENCOUNTER — Other Ambulatory Visit: Payer: Self-pay

## 2019-11-02 DIAGNOSIS — I10 Essential (primary) hypertension: Secondary | ICD-10-CM

## 2019-11-02 DIAGNOSIS — E782 Mixed hyperlipidemia: Secondary | ICD-10-CM | POA: Diagnosis not present

## 2019-11-02 DIAGNOSIS — M25511 Pain in right shoulder: Secondary | ICD-10-CM

## 2019-11-02 DIAGNOSIS — M545 Low back pain, unspecified: Secondary | ICD-10-CM

## 2019-11-02 DIAGNOSIS — M79641 Pain in right hand: Secondary | ICD-10-CM | POA: Diagnosis not present

## 2019-11-02 DIAGNOSIS — G8929 Other chronic pain: Secondary | ICD-10-CM | POA: Diagnosis not present

## 2019-11-02 DIAGNOSIS — E0590 Thyrotoxicosis, unspecified without thyrotoxic crisis or storm: Secondary | ICD-10-CM | POA: Diagnosis not present

## 2019-11-02 DIAGNOSIS — E559 Vitamin D deficiency, unspecified: Secondary | ICD-10-CM

## 2019-11-02 NOTE — Progress Notes (Signed)
Subjective: Patient is here for ultrasound-guided intra-articular right glenohumeral injection.  Has GH DJD.  Procedure: Ultrasound-guided right glenohumeral injection: After sterile prep with Betadine, injected 8 cc 1% lidocaine without epinephrine and 40 mg methylprednisolone using a 22-gauge spinal needle, passing the needle through approach into the glenohumeral joint.  Injectate was seen filling the joint capsule.  She will follow-up as directed.
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
## 2019-11-04 ENCOUNTER — Telehealth: Payer: Self-pay | Admitting: Orthopaedic Surgery

## 2019-11-04 DIAGNOSIS — F0391 Unspecified dementia with behavioral disturbance: Secondary | ICD-10-CM | POA: Diagnosis not present

## 2019-11-04 DIAGNOSIS — S32030D Wedge compression fracture of third lumbar vertebra, subsequent encounter for fracture with routine healing: Secondary | ICD-10-CM | POA: Diagnosis not present

## 2019-11-04 DIAGNOSIS — I5032 Chronic diastolic (congestive) heart failure: Secondary | ICD-10-CM | POA: Diagnosis not present

## 2019-11-04 DIAGNOSIS — S72141D Displaced intertrochanteric fracture of right femur, subsequent encounter for closed fracture with routine healing: Secondary | ICD-10-CM | POA: Diagnosis not present

## 2019-11-04 DIAGNOSIS — I130 Hypertensive heart and chronic kidney disease with heart failure and stage 1 through stage 4 chronic kidney disease, or unspecified chronic kidney disease: Secondary | ICD-10-CM | POA: Diagnosis not present

## 2019-11-04 DIAGNOSIS — N183 Chronic kidney disease, stage 3 unspecified: Secondary | ICD-10-CM | POA: Diagnosis not present

## 2019-11-04 NOTE — Telephone Encounter (Signed)
FYI. Referral has already been entered.

## 2019-11-04 NOTE — Telephone Encounter (Signed)
Referral has been sent to Dr. Ernestina Patches for review.

## 2019-11-04 NOTE — Telephone Encounter (Signed)
Patient's daughter left a voicemail message to let Dr. Erlinda Hong know that they are wanting to move forward with the injection in her back.  CB#[phone].  Thank you.

## 2019-11-07 ENCOUNTER — Ambulatory Visit (INDEPENDENT_AMBULATORY_CARE_PROVIDER_SITE_OTHER): Payer: Medicare Other | Admitting: Nurse Practitioner

## 2019-11-07 ENCOUNTER — Other Ambulatory Visit: Payer: Self-pay

## 2019-11-07 ENCOUNTER — Encounter: Payer: Self-pay | Admitting: Nurse Practitioner

## 2019-11-07 ENCOUNTER — Other Ambulatory Visit: Payer: Self-pay | Admitting: Physical Medicine and Rehabilitation

## 2019-11-07 ENCOUNTER — Telehealth: Payer: Self-pay | Admitting: Orthopaedic Surgery

## 2019-11-07 DIAGNOSIS — F419 Anxiety disorder, unspecified: Secondary | ICD-10-CM

## 2019-11-07 DIAGNOSIS — J449 Chronic obstructive pulmonary disease, unspecified: Secondary | ICD-10-CM | POA: Diagnosis not present

## 2019-11-07 DIAGNOSIS — M8949 Other hypertrophic osteoarthropathy, multiple sites: Secondary | ICD-10-CM | POA: Diagnosis not present

## 2019-11-07 DIAGNOSIS — M159 Polyosteoarthritis, unspecified: Secondary | ICD-10-CM

## 2019-11-07 DIAGNOSIS — I10 Essential (primary) hypertension: Secondary | ICD-10-CM | POA: Diagnosis not present

## 2019-11-07 DIAGNOSIS — E0590 Thyrotoxicosis, unspecified without thyrotoxic crisis or storm: Secondary | ICD-10-CM | POA: Diagnosis not present

## 2019-11-07 DIAGNOSIS — F0391 Unspecified dementia with behavioral disturbance: Secondary | ICD-10-CM | POA: Diagnosis not present

## 2019-11-07 DIAGNOSIS — K219 Gastro-esophageal reflux disease without esophagitis: Secondary | ICD-10-CM | POA: Diagnosis not present

## 2019-11-07 DIAGNOSIS — M150 Primary generalized (osteo)arthritis: Secondary | ICD-10-CM

## 2019-11-07 NOTE — Progress Notes (Signed)
This service is provided via telemedicine  No vital signs collected/recorded due to the encounter was a telemedicine visit.   Location of patient (ex: home, work): Home  Patient consents to a telephone visit:  Yes  Location of the provider (ex: office, home):  [facility], Office   Name of any referring provider: N/A  Names of all persons participating in the telemedicine service and their role in the encounter:  S.Chrae B/CMA, Sherrie Mustache, NP,[name] (daughter) and, Patient   Time spent on call:  9 min with medical assistant      Careteam: Patient Care Team: Lauree Chandler, NP as PCP - General (Nurse Practitioner) Fay Records, MD as PCP - Cardiology (Cardiology) Tanda Rockers, MD as Consulting Physician (Pulmonary Disease) Conan Bowens, RN as Registered Nurse (Hospice and Palliative Medicine) Hilarie Fredrickson Lajuan Lines, MD as Consulting Physician (Gastroenterology)  Advanced Directive information    Allergies  Allergen Reactions  . Lidocaine Anaphylaxis, Swelling, Rash and Other (See Comments)    Any of the " [facility] "  . Other Other (See Comments)    All drugs that end in "cane'-  novacane etc. Daughter states patient almost died when she had it when she was born    Chief Complaint  Patient presents with  . Follow-up    Follow-up telephone/video visit. High fall risk   . Injections    Discuss epidural injection recommendation  . Dysphagia    Discuss medication crushing, mucinex  . DME    Discuss how long patient is to wear compression glove     HPI: Patient is a 83 y.o. female seen in the office for follow up. Had a very detailed video visit with Dinah Np on 10/21/2019 and was no aware of today's visit but does have some questions.    Anxiety- on zoloft 100 mg daily per neurologist, she was having a lot of issues with her being combative.   Subclinical hyperthyroid- recent TSH in normal range  Tremor- continues on propranolol, no  tremor.   Dementia with behaviors- maintained on aricept and namenda. She ongoing behaviors and being combative. Overall better "but not great" palliative care since coming in. Continue to declines functionally and cognitively.   Hypertension- 156/82 on 11/06/2019, 154/87 on 11/29 unsure if it is prior to medication.   Low protein noted on lab- daughter reports she has Glucerna every day and then having protein with lunch (chicken salad) and dinner.   Low back pain- not walking or bearing weight (using lift to transfer). Has PT which is not improving. 3 doctors have recommended injection and feel like this may benefit her mobility.   COPD with cough and congestion- on mucinex DM which she can NOT crush- educated on this- it is extended release. Uses more albuterol when needed for increase cough and congestion. Does not like to do her nebulizer.   Got compression gloves -recommended by orthopedic- due to swollen and painful hand which has provided benefit but unsure how often she should take it off- took it off over night and hand became more swollen. Plans to ask ortho more about this.   Hyperlipidemia- significantly elevated- daughter feels like she has not been on statin since May.  GERD- occasionally with worsening symptoms. Uses Zofran when having nausea which has been effective.   Review of Systems: provided by daughter- pt with dementia.  Review of Systems  Constitutional: Negative for chills, fever and weight loss.  Respiratory: Positive for  cough, sputum production and wheezing. Negative for shortness of breath.        Cough and congestion, some days worse than other  Cardiovascular: Negative for chest pain and palpitations.  Gastrointestinal: Positive for heartburn (occasional). Negative for abdominal pain, constipation and diarrhea.  Genitourinary: Negative for dysuria, frequency and urgency.  Musculoskeletal: Positive for back pain, joint pain and myalgias. Negative for falls.   Skin: Negative.   Neurological: Positive for weakness. Negative for dizziness and headaches.  Psychiatric/Behavioral: Positive for memory loss. Negative for depression. The patient is nervous/anxious.     Past Medical History:  Diagnosis Date  . Anxiety 07/08/2017  . Arthritis   . Carotid artery disease ([place])    a. s/p L CEA. b. followed by [facility].  . Carotid stenosis 10/19/2012  . Chronic diastolic CHF (congestive heart failure) ([place])   . CKD (chronic kidney disease), stage III   . Congestive heart disease ([place])   . COPD (chronic obstructive pulmonary disease) ([place])   . COPD GOLD IV 12/30/2014   PFTs 12/15/14 FEV1  0.57 (29%) ratio 54 p 17% resp to saba    . Cough 01/31/2016  . Dementia with behavioral disturbance ([place]) 07/08/2017  . Diverticulosis   . Dyspnea   . Essential hypertension 12/02/2014  . Fall at home Sept. 2013  . Family history of adverse reaction to anesthesia    SON IS SLOW TO WAKE   . Gastroesophageal reflux disease 07/08/2017  . Hyperlipidemia   . Hypertension   . Hyponatremia 12/02/2014  . Hypotension 02/01/2017  . Incontinent of urine   . Internal hemorrhoid   . Intertrochanteric fracture ([place]) 04/20/2019  . Memory disorder 10/24/2014  . Nausea and vomiting 11/30/2014  . Primary osteoarthritis involving multiple joints 07/08/2017  . Vitamin D deficiency 02/15/2015   Past Surgical History:  Procedure Laterality Date  . APPENDECTOMY    . BREAST REDUCTION SURGERY    . CAROTID ENDARTERECTOMY     left CEA  . CATARACT EXTRACTION Bilateral   . COLONOSCOPY  2015  . EYE SURGERY     cataracts removed, bilaterally   . INTRAMEDULLARY (IM) NAIL INTERTROCHANTERIC Right 04/21/2019   Procedure: INTRAMEDULLARY (IM) NAIL INTERTROCHANTRIC;  Surgeon: Rod Can, MD;  Location: [facility];  Service: Orthopedics;  Laterality: Right;  . skin cancer removal    . TONSILLECTOMY     Social History:   reports that she quit smoking about 7 months ago. Her smoking use included cigarettes. She  has a 5.00 pack-year smoking history. She has never used smokeless tobacco. She reports current alcohol use. She reports that she does not use drugs.  Family History  Problem Relation Age of Onset  . Heart disease Father        Before age 86  . Hypertension Father   . Heart attack Father   . Cancer Mother 100       Brain  . Dementia Brother   . Cancer Maternal Aunt 61       breast cancer  . Diabetes Maternal Aunt   . High blood pressure Brother   . Hypothyroidism Daughter   . High blood pressure Son   . High Cholesterol Son   . Diabetes Son   . COPD Son   . Obesity Son   . Heart failure Maternal Grandmother   . Diabetes Maternal Grandmother   . Stroke Neg Hx     Medications: Patient's Medications  New Prescriptions   No medications on file  Previous Medications  ACETAMINOPHEN (TYLENOL) 500 MG TABLET    Take 500 mg by mouth 3 (three) times daily as needed.   AMBULATORY NON FORMULARY MEDICATION    1.Home health Nurse to cleanse right shin area skin tear with saline,pat dry,apply triple antibiotic ointment to wound bed and cover with foam dressing for protection and absorption.change dressing every 3 days and as needed if soiled.Notify provider for any yellow drainage or if wound worsen.  2. Apply foam dressing to left lower leg bruise area for extra protection.   CARBINOXAMINE MALEATE 4 MG TABS    Take 1 tablet (4 mg total) by mouth every 8 (eight) hours as needed.   CHOLECALCIFEROL (D3 SUPER STRENGTH) 2000 UNITS CAPS    Take 1 capsule (2,000 Units total) by mouth daily.   DEXTROMETHORPHAN-GUAIFENESIN (MUCINEX DM) 30-600 MG TB12    TAKE 2 TABLETS BY MOUTH TWICE DAILY   DONEPEZIL (ARICEPT) 10 MG TABLET    Take 1 tablet (10 mg total) by mouth daily.   FUROSEMIDE (LASIX) 20 MG TABLET    Take 1 tablet (20 mg total) by mouth daily.   IPRATROPIUM-ALBUTEROL (DUONEB) 0.5-2.5 (3) MG/3ML SOLN    inhale contents of 1 vial in nebulizer every 6 hours   MEMANTINE (NAMENDA) 10 MG TABLET     TAKE 1 TABLET BY MOUTH TWICE DAILY   ONDANSETRON (ZOFRAN-ODT) 4 MG DISINTEGRATING TABLET    Take 4 mg by mouth as needed for nausea or vomiting.   PANTOPRAZOLE (PROTONIX) 40 MG TABLET    TAKE 1 TABLET(40 MG) BY MOUTH TWICE DAILY   PROPRANOLOL (INDERAL) 20 MG TABLET    TAKE 1 TABLET(20 MG) BY MOUTH TWICE DAILY   SERTRALINE (ZOLOFT) 50 MG TABLET    Take 2 tablets (100 mg total) by mouth daily.   UNABLE TO FIND    Med Name: right hand compression glove wear daily for edema.  Modified Medications   No medications on file  Discontinued Medications   No medications on file    Physical Exam:  There were no vitals filed for this visit. There is no height or weight on file to calculate BMI. Wt Readings from Last 3 Encounters:  04/21/19 178 lb (80.7 kg)  12/14/18 190 lb (86.2 kg)  12/07/18 187 lb 1.9 oz (84.9 kg)      Labs reviewed: Basic Metabolic Panel: Recent Labs    04/25/19 1110 04/26/19 1045 05/25/19 11/02/19 1411  NA 142 144 141 144  K 3.6 3.4* 4.1 4.0  CL 104 105  --  101  CO2 27 28  --  29  GLUCOSE 117* 114*  --  101*  BUN 18 15 12 24   CREATININE 0.74 0.76 0.8 0.99  CALCIUM 8.5* 8.8*  --  9.1  TSH  --   --  0.35* 0.561   Liver Function Tests: Recent Labs    11/02/19 1411  AST 16  ALT 8  ALKPHOS 74  BILITOT 0.5  PROT 5.5*  ALBUMIN 3.6   No results for input(s): LIPASE, AMYLASE in the last 8760 hours. No results for input(s): AMMONIA in the last 8760 hours. CBC: Recent Labs    04/20/19 0456  04/22/19 0112 04/25/19 1110 05/25/19 11/02/19 1411  WBC 10.7*   < > 9.9 8.3 8.2 7.5  NEUTROABS 8.0*  --   --   --  6 4.0  HGB 13.6   < > 10.0* 9.2* 12.0 12.8  HCT 43.2   < > 31.7* 28.9* 38 39.8  MCV 94.3   < >  94.3 93.2  --  89  PLT 234   < > 191 230 283 240   < > = values in this interval not displayed.   Lipid Panel: Recent Labs    11/02/19 1411  CHOL 336*  HDL 42  LDLCALC 207*  TRIG 417*  CHOLHDL 8.0*   TSH: Recent Labs    05/25/19 11/02/19 1411   TSH 0.35* 0.561   A1C: Lab Results  Component Value Date   HGBA1C 5.2 12/10/2017     Assessment/Plan 1. Primary osteoarthritis involving multiple joints Ongoing, following with orthopedic who has given her injections since she is not a surgical candidate. Continues on tylenol as needed   2. Essential hypertension -continues on propranolol, elevated readings on the ones recorded. Previously controlled. Daughter will monitor. Goal <140/90. Has had hypotension in the past  3. Dementia with behavioral disturbance, unspecified dementia type ([place]) -ongoing decline in cognitive and functional status. Increase in pain as well contributing to functional decline. She also has issues with combativeness that has improved since June. Continues on namenda and aricept   4. Gastroesophageal reflux disease without esophagitis Stable on protonix but still with occasional break through. Encouraged lifestyle modifications.  5. Subclinical hyperthyroidism -TSH in normal range on recent lab. No ongoing tremor noted.   6. Anxiety Improved on zoloft 100 mg daily.   7. COPD GOLD IV Ongoing cough and congestion, educated not to crush mucinex has it is extended release, can get liquid form. She is not wanting to do her nebulizer when needed. On no maintenance medication however compliance would be an issue with inhalers as she most likely would not be able to properly use  Them.   Next appt: 4 months for routine follow up.  Carlos American. Harle Battiest  [facility] [phone]   Virtual Visit via Video Note  I connected with Virginia [name] on 11/07/19 at  1:00 PM EST by a video enabled telemedicine application and verified that I am speaking with the correct person using two identifiers.  Location: Patient: home Provider: office   I discussed the limitations of evaluation and management by telemedicine and the availability of in person appointments. The patient expressed  understanding and agreed to proceed.    I discussed the assessment and treatment plan with the patient. The patient was provided an opportunity to ask questions and all were answered. The patient agreed with the plan and demonstrated an understanding of the instructions.   The patient was advised to call back or seek an in-person evaluation if the symptoms worsen or if the condition fails to improve as anticipated.  I provided 26 minutes of non-face-to-face time during this encounter.  Carlos American. Dewaine Oats, AGNP Avs printed and mailed.

## 2019-11-07 NOTE — Telephone Encounter (Signed)
Just during the day.

## 2019-11-07 NOTE — Telephone Encounter (Signed)
Pt daughter [name] called in said her mother received a compression glove at her last appt and she wanted to verify the hours her mother should be wearing it?  Please give her daughter a call  [phone]

## 2019-11-08 ENCOUNTER — Telehealth: Payer: Self-pay | Admitting: *Deleted

## 2019-11-08 ENCOUNTER — Ambulatory Visit: Payer: Medicare Other | Admitting: Orthopaedic Surgery

## 2019-11-08 LAB — TSH: TSH: 0.561 u[IU]/mL (ref 0.450–4.500)

## 2019-11-08 LAB — CBC WITH DIFFERENTIAL/PLATELET
Basophils Absolute: 0.1 10*3/uL (ref 0.0–0.2)
Basos: 1 %
EOS (ABSOLUTE): 0.3 10*3/uL (ref 0.0–0.4)
Eos: 4 %
Hematocrit: 39.8 % (ref 34.0–46.6)
Hemoglobin: 12.8 g/dL (ref 11.1–15.9)
Immature Grans (Abs): 0 10*3/uL (ref 0.0–0.1)
Immature Granulocytes: 0 %
Lymphocytes Absolute: 2.5 10*3/uL (ref 0.7–3.1)
Lymphs: 33 %
MCH: 28.8 pg (ref 26.6–33.0)
MCHC: 32.2 g/dL (ref 31.5–35.7)
MCV: 89 fL (ref 79–97)
Monocytes Absolute: 0.7 10*3/uL (ref 0.1–0.9)
Monocytes: 9 %
Neutrophils Absolute: 4 10*3/uL (ref 1.4–7.0)
Neutrophils: 53 %
Platelets: 240 10*3/uL (ref 150–450)
RBC: 4.45 x10E6/uL (ref 3.77–5.28)
RDW: 12.9 % (ref 11.7–15.4)
WBC: 7.5 10*3/uL (ref 3.4–10.8)

## 2019-11-08 LAB — COMPREHENSIVE METABOLIC PANEL
ALT: 8 IU/L (ref 0–32)
AST: 16 IU/L (ref 0–40)
Albumin/Globulin Ratio: 1.9 (ref 1.2–2.2)
Albumin: 3.6 g/dL (ref 3.6–4.6)
Alkaline Phosphatase: 74 IU/L (ref 39–117)
BUN/Creatinine Ratio: 24 (ref 12–28)
BUN: 24 mg/dL (ref 8–27)
Bilirubin Total: 0.5 mg/dL (ref 0.0–1.2)
CO2: 29 mmol/L (ref 20–29)
Calcium: 9.1 mg/dL (ref 8.7–10.3)
Chloride: 101 mmol/L (ref 96–106)
Creatinine, Ser: 0.99 mg/dL (ref 0.57–1.00)
GFR calc Af Amer: 62 mL/min/{1.73_m2} (ref >59)
GFR calc non Af Amer: 54 mL/min/{1.73_m2} — ABNORMAL LOW (ref >59)
Globulin, Total: 1.9 g/dL (ref 1.5–4.5)
Glucose: 101 mg/dL — ABNORMAL HIGH (ref 65–99)
Potassium: 4 mmol/L (ref 3.5–5.2)
Sodium: 144 mmol/L (ref 134–144)
Total Protein: 5.5 g/dL — ABNORMAL LOW (ref 6.0–8.5)

## 2019-11-08 LAB — LIPID PANEL
Chol/HDL Ratio: 8 ratio — ABNORMAL HIGH (ref 0.0–4.4)
Cholesterol, Total: 336 mg/dL — ABNORMAL HIGH (ref 100–199)
HDL: 42 mg/dL (ref >39)
LDL Chol Calc (NIH): 207 mg/dL — ABNORMAL HIGH (ref 0–99)
Triglycerides: 417 mg/dL — ABNORMAL HIGH (ref 0–149)
VLDL Cholesterol Cal: 87 mg/dL — ABNORMAL HIGH (ref 5–40)

## 2019-11-08 LAB — VITAMIN D 1,25 DIHYDROXY
Vitamin D 1, 25 (OH)2 Total: 34 pg/mL
Vitamin D2 1, 25 (OH)2: <10 pg/mL
Vitamin D3 1, 25 (OH)2: 33 pg/mL

## 2019-11-08 LAB — T3, FREE: T3, Free: 2.1 pg/mL (ref 2.0–4.4)

## 2019-11-08 LAB — T4, FREE: Free T4: 1.41 ng/dL (ref 0.82–1.77)

## 2019-11-08 NOTE — Telephone Encounter (Signed)
Contacted residence and spoke with patient's daughter, [name], to arrange a home visit. Visit scheduled for Thursday, 11/10/19 at 3:30 pm.

## 2019-11-09 ENCOUNTER — Telehealth: Payer: Self-pay | Admitting: Internal Medicine

## 2019-11-09 DIAGNOSIS — E782 Mixed hyperlipidemia: Secondary | ICD-10-CM

## 2019-11-09 NOTE — Telephone Encounter (Signed)
Pt's daughter calling stating that pt had blood work done and pt's cholesterol was high and daughter also stated that Dr. Harrington Challenger said for pt to start back taking a statin, but no medication was sent to pt's pharmacy. Please address

## 2019-11-09 NOTE — Telephone Encounter (Signed)
Attempted to reach dtr to inform her Dr Harrington Challenger and her nurse are not in the office today but will return tomorrow -  No answer, voicemail full, unable to leave a message.

## 2019-11-09 NOTE — Telephone Encounter (Signed)
Burley Saver advised on message.

## 2019-11-10 ENCOUNTER — Other Ambulatory Visit: Payer: Self-pay

## 2019-11-10 ENCOUNTER — Other Ambulatory Visit: Payer: Medicare Other | Admitting: *Deleted

## 2019-11-10 DIAGNOSIS — Z515 Encounter for palliative care: Secondary | ICD-10-CM

## 2019-11-10 MED ORDER — ROSUVASTATIN CALCIUM 20 MG PO TABS: 20.0000 mg | ORAL_TABLET | Freq: Every day | ORAL | 3 refills | Status: DC

## 2019-11-10 NOTE — Telephone Encounter (Signed)
Sent information via [facility]. Sent prescription for Crestor to [facility] orders in Epic.  Due around Feb 5.

## 2019-11-10 NOTE — Telephone Encounter (Signed)
Would recomm Crestor 20 mg   F?U lipids in 8 wks with AST

## 2019-11-10 NOTE — Progress Notes (Signed)
COMMUNITY PALLIATIVE CARE RN NOTE  PATIENT NAME: Virginia [name] DOB: 11-Apr-1936 MRN: [id number]  PRIMARY CARE PROVIDER: Lauree Chandler, NP  RESPONSIBLE PARTY:  Acct ID - Guarantor Home Phone Work Phone Relationship Acct Type  [id number] Virginia, [name]* [phone]  Self P/F     [address] [place], [place] [address]   Covid-19 Pre-screening Negative  PLAN OF CARE and INTERVENTION:  1. ADVANCE CARE PLANNING/GOALS OF CARE: Goal is for patient to remain in her home. She is a Full code. 2. PATIENT/CAREGIVER EDUCATION: Symptom Management 3. DISEASE STATUS: Met with patient, daughter Virginia [name], son Virginia [name] and brother Virginia [name]. Upon arrival, patient is sitting up in her recliner awake. She seems more confused today. Family has a PT named Virginia [name] that comes out to help with ROM exercises. Spoke with Virginia [name] prior to leaving resident and he states that patient was more confused and having difficulties following commands so he was unable to do much exercise with her today. Patient has been more combative during personal care and hired caregivers have been having difficulty getting her briefs changed. She is also sleeping more throughout the day. She experiences pain in her R shoulder, R hip and R leg. She also has pain in her lower back. She has an appointment with her Orthopaedic MD on 11/30/19 for an epidural to help with back pain. She recently had an xray of her right arm d/t swelling. It was negative for fracture but did show significant osteoarthritis. She now has a compression glove. She also received a steroid injection in her R shoulder. She continues with work with PT through [facility]. He only recommends weight bearing with transfers for fall and safety precautions. She is transferred with 2 person assistance. Recently during a transfer, patient had a fall where she was lowered to the ground without injury. EMS came and assisted patient up. They have also had to utilize the [place] lift recently d/t weakness.  Her intake is variable. She ate a good lunch today. She also does not always eat her meals. She does have an intermittent expiratory wheeze bilaterally and continues with coughing. The family is going to ask MD for Prednisone. She completed a course of antibiotics about 3 weeks ago for suspected aspiration pneumonia. She does experience dysphagia. Her medications are given crushed in applesauce. Daughter is concerned with patient's elevated cholesterol. She was taken off of her cholesterol medication about 1 1/2 years ago per daughter. She is going to speak with Cardiologist. Will continue to monitor.  HISTORY OF PRESENT ILLNESS:  This is a 83 yo female who resides at home with her son and daughter in law. [name] is also there to assist with personal care. Palliative care team continues to follow patient. Will continue to visit monthly and PRN.  CODE STATUS: Full Code ADVANCED DIRECTIVES: Y MOST FORM: no PPS: 30%   PHYSICAL EXAM:   LUNGS: expiratory wheezes bilaterally CARDIAC: Cor RRR EXTREMITIES: slight swelling noted to right hand SKIN: Exposed skin is dry and intact  NEURO: Increased confusion, increased generalized weakness, transferred with 2 person assistance   (Duration of visit and documentation 75 minutes)   [place]ia Eastern, RN BSN

## 2019-11-11 ENCOUNTER — Telehealth: Payer: Self-pay | Admitting: Neurology

## 2019-11-11 NOTE — Telephone Encounter (Signed)
Pt daughter(on DPR-Fallert,[name]) has called to report that pt's combativness has not gotten any better.  Daughter states it takes about 2 hours to get pt to go to bed.  Daughter is asking for something to be called in for pt.to [facility] [phone] Daughter was made aware that it is not likely that this message will be responded to before Monday.  Daughter expressed this will mean 3 restless night for pt while waiting for a response to this message.  Daughter was again reminded as soon as the RN is able she will respond.

## 2019-11-14 MED ORDER — RISPERIDONE 0.25 MG PO TABS: 0.2500 mg | ORAL_TABLET | Freq: Every day | ORAL | 3 refills | Status: DC

## 2019-11-14 NOTE — Addendum Note (Signed)
Addended by: Suzzanne Cloud on: 11/14/2019 02:17 PM   Modules accepted: Orders

## 2019-11-14 NOTE — Telephone Encounter (Signed)
She can try low-dose Risperdal 0.25 mg between 4-6 pm to see if that helps with her agitation. It seems this occurs mostly when she is so tired at the end of the day. Any benefit with Zoloft, we could back off the dose? Be cautious, it may make her sleepy, making more difficult to get to bed. I am starting low dose. Ensure she has follow-up please.

## 2019-11-14 NOTE — Telephone Encounter (Signed)
I called daughter, [name].  I relayed that per SS/NP what she has spoken about in VV is what she will try risperdol 0.25mg  daily po around 4-6 pm.  Zoloft 100mg  has not really helped.  I relayed that she can read about the drug and then it will be there for her to pick up.  She mentioned that pt has had ABX for asthma/pneumonia about 3 wks ago.  ? About prednisone since this is still issue, has a call into pulmonary. I relayed that will make her sleepy , and physically harder to get to bed, not more agitated.  She verbalized understanding.

## 2019-11-14 NOTE — Telephone Encounter (Signed)
I called and LM with family member to have [name] call back.

## 2019-11-15 ENCOUNTER — Telehealth: Payer: Self-pay | Admitting: *Deleted

## 2019-11-15 NOTE — Telephone Encounter (Signed)

## 2019-11-15 NOTE — Telephone Encounter (Signed)
Reached patient's daughter on phone. Updated her with lab results/review and recommendations from Dr. Harrington Challenger. She will start Crestor tonight.  States pt does not always take her medications, especially whole and the pharmacist told her not to crush this one but she will do her best.  Patient has become combative at times.    Appointment for virtual visit scheduled 01/23/20. Will repeat lipids either before or after that appointment but not sure of day because patient may/may not be having a good enough day to bring her out.  Will have orders in and wait to schedule lab appointment.

## 2019-11-17 ENCOUNTER — Other Ambulatory Visit: Payer: Self-pay | Admitting: Nurse Practitioner

## 2019-11-17 DIAGNOSIS — F419 Anxiety disorder, unspecified: Secondary | ICD-10-CM

## 2019-11-17 NOTE — Telephone Encounter (Signed)
Requested dose is different from dose on med list   RX is not due for refill

## 2019-11-17 NOTE — Telephone Encounter (Signed)
LMVM for daughter to call.

## 2019-11-17 NOTE — Telephone Encounter (Addendum)
Patient daughter Virginia [name] (not on updated DPR) called to inform that patient was calmer prior to Risperidol and also seems to be more disoriented than usual.   Please follow up.

## 2019-11-21 MED ORDER — ALPRAZOLAM 0.25 MG PO TABS: 0.2500 mg | ORAL_TABLET | Freq: Every evening | ORAL | 0 refills | Status: DC | PRN

## 2019-11-21 NOTE — Telephone Encounter (Signed)
I called and spoke to Hughes Better, brother to pt, who lives with pt and family.  He stated from his observation that pt had personality change (stood on own several times) (kitchen on wooden floor) which is unlike her.  Combativeness still a issue.  Only gave the risperidol once.  Another med to try?  (dorothy out of town for several days).

## 2019-11-21 NOTE — Addendum Note (Signed)
Addended by: Suzzanne Cloud on: 11/21/2019 01:40 PM   Modules accepted: Orders

## 2019-11-21 NOTE — Telephone Encounter (Signed)
I reviewed the chart and called her brother.  She reports last week she was given 1 dose of Risperdal 0.25 mg, the next day, he reports her judgment was off, she was standing on her own, and felt she had more ability than she really did. He was scared by her confidence.  It did not help with agitation.  She only took 1 dose of the medication.  The issue continues to be agitation in the evenings, difficulty with her care.  She can stop Risperdal. I will order the Xanax she was given in June by her PCP for agitation. Xanax 0.25 mg, in the evening for agitation. Please note, may make her drowsy.   Please call her brother, [name] and let him know I sent in the rx. Stop the Risperdal. I tried to call her daughter, but went straight to voicemail, and mailbox was full.

## 2019-11-25 ENCOUNTER — Other Ambulatory Visit: Payer: Self-pay | Admitting: Nurse Practitioner

## 2019-11-25 DIAGNOSIS — E0590 Thyrotoxicosis, unspecified without thyrotoxic crisis or storm: Secondary | ICD-10-CM

## 2019-11-25 DIAGNOSIS — R251 Tremor, unspecified: Secondary | ICD-10-CM

## 2019-11-25 DIAGNOSIS — I10 Essential (primary) hypertension: Secondary | ICD-10-CM

## 2019-11-28 ENCOUNTER — Other Ambulatory Visit: Payer: Self-pay | Admitting: Nurse Practitioner

## 2019-11-28 DIAGNOSIS — J441 Chronic obstructive pulmonary disease with (acute) exacerbation: Secondary | ICD-10-CM

## 2019-11-28 NOTE — Telephone Encounter (Signed)
rx sent to pharmacy by e-script  

## 2019-11-29 DIAGNOSIS — M545 Low back pain: Secondary | ICD-10-CM

## 2019-11-29 DIAGNOSIS — Z85828 Personal history of other malignant neoplasm of skin: Secondary | ICD-10-CM

## 2019-11-29 DIAGNOSIS — M150 Primary generalized (osteo)arthritis: Secondary | ICD-10-CM

## 2019-11-29 DIAGNOSIS — S32030D Wedge compression fracture of third lumbar vertebra, subsequent encounter for fracture with routine healing: Secondary | ICD-10-CM

## 2019-11-29 DIAGNOSIS — R1312 Dysphagia, oropharyngeal phase: Secondary | ICD-10-CM | POA: Diagnosis not present

## 2019-11-29 DIAGNOSIS — Z9181 History of falling: Secondary | ICD-10-CM | POA: Diagnosis not present

## 2019-11-29 DIAGNOSIS — S72141D Displaced intertrochanteric fracture of right femur, subsequent encounter for closed fracture with routine healing: Secondary | ICD-10-CM | POA: Diagnosis not present

## 2019-11-29 DIAGNOSIS — R41841 Cognitive communication deficit: Secondary | ICD-10-CM | POA: Diagnosis not present

## 2019-11-29 DIAGNOSIS — I130 Hypertensive heart and chronic kidney disease with heart failure and stage 1 through stage 4 chronic kidney disease, or unspecified chronic kidney disease: Secondary | ICD-10-CM | POA: Diagnosis not present

## 2019-11-29 DIAGNOSIS — F0391 Unspecified dementia with behavioral disturbance: Secondary | ICD-10-CM | POA: Diagnosis not present

## 2019-11-29 DIAGNOSIS — I5032 Chronic diastolic (congestive) heart failure: Secondary | ICD-10-CM | POA: Diagnosis not present

## 2019-11-30 ENCOUNTER — Telehealth: Payer: Self-pay | Admitting: *Deleted

## 2019-11-30 ENCOUNTER — Encounter: Payer: Self-pay | Admitting: Physical Medicine and Rehabilitation

## 2019-11-30 ENCOUNTER — Ambulatory Visit: Payer: Self-pay

## 2019-11-30 ENCOUNTER — Ambulatory Visit (INDEPENDENT_AMBULATORY_CARE_PROVIDER_SITE_OTHER): Payer: Medicare Other | Admitting: Physical Medicine and Rehabilitation

## 2019-11-30 ENCOUNTER — Other Ambulatory Visit: Payer: Self-pay

## 2019-11-30 VITALS — BP 131/74 | HR 92

## 2019-11-30 DIAGNOSIS — M47816 Spondylosis without myelopathy or radiculopathy, lumbar region: Secondary | ICD-10-CM | POA: Diagnosis not present

## 2019-11-30 DIAGNOSIS — G8929 Other chronic pain: Secondary | ICD-10-CM | POA: Diagnosis not present

## 2019-11-30 DIAGNOSIS — S32030D Wedge compression fracture of third lumbar vertebra, subsequent encounter for fracture with routine healing: Secondary | ICD-10-CM

## 2019-11-30 DIAGNOSIS — M159 Polyosteoarthritis, unspecified: Secondary | ICD-10-CM

## 2019-11-30 DIAGNOSIS — M545 Low back pain, unspecified: Secondary | ICD-10-CM

## 2019-11-30 DIAGNOSIS — M8949 Other hypertrophic osteoarthropathy, multiple sites: Secondary | ICD-10-CM | POA: Diagnosis not present

## 2019-11-30 DIAGNOSIS — R296 Repeated falls: Secondary | ICD-10-CM

## 2019-11-30 NOTE — Telephone Encounter (Signed)
Okay to get UA C&S, CBC with diff and BMP lab draw

## 2019-11-30 NOTE — Telephone Encounter (Signed)
Nicki with [facility] called and stated that the family contacted them regarding patient being more Confused, Hallucinating and Lethargic. Nurse stated that the family called EMS and they came out and evaluated that patient but would not transport her to the hospital due to [place]. Family is requesting [facility] to come out for a Nursing evaluation and to collect a urine for U/A and Cx. Please Advise.

## 2019-11-30 NOTE — Telephone Encounter (Signed)
12:12 pm Received a voicemail from patient's daughter, Shyana Decastro, stating that patient had a significant coughing spell on 12/28 and family thought that patient was aspirating. EMS was contacted and wanted to take patient to the ED for x-rays, however family declined wanting to keep patient at home. [name] states that she reached out to her PCP and the message she received stated that they would call back in within 2 hours. [name] states when they call back, they will not be at home because patient is being transported to her Ortho MD to have an epidural procedure and she is without her cell phone. Called back this evening and spoke with [name]. Visit scheduled for tomorrow, 12/01/19 at noon.

## 2019-11-30 NOTE — Telephone Encounter (Signed)
Nicki with [facility] notified and agreed. She will contact family.

## 2019-11-30 NOTE — Progress Notes (Signed)
 .  Numeric Pain Rating Scale and Functional Assessment Average Pain 0   In the last MONTH (on 0-10 scale) has pain interfered with the following?  1. General activity like being  able to carry out your everyday physical activities such as walking, climbing stairs, carrying groceries, or moving a chair?  Rating(6)   +Driver, -BT, -Dye Allergies.

## 2019-12-01 ENCOUNTER — Other Ambulatory Visit: Payer: Medicare Other | Admitting: *Deleted

## 2019-12-01 DIAGNOSIS — M545 Low back pain: Secondary | ICD-10-CM | POA: Diagnosis not present

## 2019-12-01 DIAGNOSIS — S72141D Displaced intertrochanteric fracture of right femur, subsequent encounter for closed fracture with routine healing: Secondary | ICD-10-CM | POA: Diagnosis not present

## 2019-12-01 DIAGNOSIS — S32030D Wedge compression fracture of third lumbar vertebra, subsequent encounter for fracture with routine healing: Secondary | ICD-10-CM | POA: Diagnosis not present

## 2019-12-01 DIAGNOSIS — Z515 Encounter for palliative care: Secondary | ICD-10-CM

## 2019-12-01 DIAGNOSIS — I5032 Chronic diastolic (congestive) heart failure: Secondary | ICD-10-CM | POA: Diagnosis not present

## 2019-12-01 DIAGNOSIS — F0391 Unspecified dementia with behavioral disturbance: Secondary | ICD-10-CM | POA: Diagnosis not present

## 2019-12-01 DIAGNOSIS — I130 Hypertensive heart and chronic kidney disease with heart failure and stage 1 through stage 4 chronic kidney disease, or unspecified chronic kidney disease: Secondary | ICD-10-CM | POA: Diagnosis not present

## 2019-12-05 ENCOUNTER — Telehealth: Payer: Self-pay | Admitting: *Deleted

## 2019-12-05 ENCOUNTER — Telehealth: Payer: Self-pay

## 2019-12-05 ENCOUNTER — Other Ambulatory Visit: Payer: Self-pay | Admitting: Internal Medicine

## 2019-12-05 NOTE — Progress Notes (Signed)
COMMUNITY PALLIATIVE CARE RN NOTE  PATIENT NAME: Virginia [name] DOB: 04-12-1936 MRN: [id number]  PRIMARY CARE PROVIDER: Lauree Chandler, NP  RESPONSIBLE PARTY:  Acct ID - Guarantor Home Phone Work Phone Relationship Acct Type  [id number] Virginia [name], Virginia [name]* [phone]  Self P/F     [address], [place], [place] [address]   Covid-19 Pre-screening Negative  PLAN OF CARE and INTERVENTION:  1. ADVANCE CARE PLANNING/GOALS OF CARE: Goal is for patient to remain in her home with hired caregivers. She is a Full code. 2. PATIENT/CAREGIVER EDUCATION: Safe Transfers/Mobility, Symptom Management 3. DISEASE STATUS:  Met with patient's brother, Virginia [name]. Hired caregivers present in the home. Caregivers continue to work 12 hours/day from 9a-9p to assist with personal care needs. Initially spoke with Virginia [name] prior to seeing patient. Daughter, Virginia [name] not present during visit today. Virginia [name] states that patient had a significant coughing spell on Monday night. Her son, Virginia [name], gave her some cough medication and thought patient may have aspirated. EMS was called and recommended taking patient to the ED, however family refused due to Covid-19 pandemic. Per Virginia [name], EMS stated that patient may possibly need a chest x-ray, antibiotics and steroids. Progress notes state that [facility] received orders from patient's PCP yesterday for a U/A, CBC and CMP. [facility] RN called during visit and is set to arrive in about 30 minutes. This week, patient also went out to see her Ortho MD to have an epidural, however Virginia [name] states that this procedure was not performed, but he is unsure why. Currently, patient is still lying in bed sleeping soundly. She does not arouse to verbal stimulation. Caregivers state that patient has been sleeping later into the morning, but she fell asleep later than usual last pm. She appears comfortable. No s/s of any distress. No coughing noted. Virginia [name] says that patient continues with a "bad" cough. She continues with  nebulizer treatments 2-3x/day. Her intake is variable. She is intermittently incontinent of both bowel and bladder. She requires 2 person assistance with transfers. Caregivers report that her behaviors have been milder over the past 2 weeks. She does better and is more agreeable to personal care when she is not awakened prematurely. She was prescribed Risperdal 0.27m at bedtime, however during the first night she took this medication, they felt she seemed incoherent and kept trying to stand up from her wheelchair without assistance. She has not had any more of this medication at this point. She does have some agitation at night. [facility] RN arrived as I was leaving. Will continue to monitor.  HISTORY OF PRESENT ILLNESS:  This is a 84yo female who resides at home. Her son Virginia Reichmannand brother Virginia Browlives with her. Palliative care team continues to monitor patient. Will continue to visit monthly and PRN.  CODE STATUS: Full Code ADVANCED DIRECTIVES: Y MOST FORM: no PPS: 30%   (Duration of visit and documentation 75 minutes)   MDaryl Eastern RN BSN

## 2019-12-05 NOTE — Telephone Encounter (Signed)
Patient daughter dorothy called requesting a letter that the patient is disabled/incompatent for jury duty.  Patient daughter requesting 2 copies.    Please follow up.

## 2019-12-05 NOTE — Telephone Encounter (Signed)
Niki with [facility] called regarding patient. They went out to home and [name], son,  would not let them cath patient or let her get out of bed to get urine because she was too tired. They wanted nurse to just leave a nuns hat for family to collect but [facility] is not allowed to do that.  They also would not let nurse do a Venipuncture to collect blood.  [place] stated that they are going to try and go back out to recollect this Wednesday if they let them. She just wanted to make you aware.

## 2019-12-06 DIAGNOSIS — S72141D Displaced intertrochanteric fracture of right femur, subsequent encounter for closed fracture with routine healing: Secondary | ICD-10-CM | POA: Diagnosis not present

## 2019-12-06 DIAGNOSIS — S32030D Wedge compression fracture of third lumbar vertebra, subsequent encounter for fracture with routine healing: Secondary | ICD-10-CM | POA: Diagnosis not present

## 2019-12-06 DIAGNOSIS — M545 Low back pain: Secondary | ICD-10-CM | POA: Diagnosis not present

## 2019-12-06 DIAGNOSIS — F0391 Unspecified dementia with behavioral disturbance: Secondary | ICD-10-CM | POA: Diagnosis not present

## 2019-12-06 DIAGNOSIS — I130 Hypertensive heart and chronic kidney disease with heart failure and stage 1 through stage 4 chronic kidney disease, or unspecified chronic kidney disease: Secondary | ICD-10-CM | POA: Diagnosis not present

## 2019-12-06 DIAGNOSIS — I5032 Chronic diastolic (congestive) heart failure: Secondary | ICD-10-CM | POA: Diagnosis not present

## 2019-12-06 NOTE — Telephone Encounter (Signed)
Noted thank you

## 2019-12-07 ENCOUNTER — Telehealth: Payer: Self-pay | Admitting: *Deleted

## 2019-12-07 ENCOUNTER — Ambulatory Visit (INDEPENDENT_AMBULATORY_CARE_PROVIDER_SITE_OTHER): Payer: Medicare Other | Admitting: Internal Medicine

## 2019-12-07 DIAGNOSIS — F0280 Dementia in other diseases classified elsewhere without behavioral disturbance: Secondary | ICD-10-CM | POA: Diagnosis not present

## 2019-12-07 DIAGNOSIS — R1013 Epigastric pain: Secondary | ICD-10-CM

## 2019-12-07 DIAGNOSIS — G309 Alzheimer's disease, unspecified: Secondary | ICD-10-CM

## 2019-12-07 DIAGNOSIS — K219 Gastro-esophageal reflux disease without esophagitis: Secondary | ICD-10-CM | POA: Diagnosis not present

## 2019-12-07 DIAGNOSIS — R110 Nausea: Secondary | ICD-10-CM | POA: Diagnosis not present

## 2019-12-07 NOTE — Patient Instructions (Signed)
Continue pantoprazole 40 mg twice daily twice daily.  Continue ondansetron ODT 4 mg every 8 hours as needed nausea  Follow up as needed.  If you are age 82 or older, your body mass index should be between 23-30. Your There is no height or weight on file to calculate BMI. If this is out of the aforementioned range listed, please consider follow up with your Primary Care Provider.  If you are age 71 or younger, your body mass index should be between 19-25. Your There is no height or weight on file to calculate BMI. If this is out of the aformentioned range listed, please consider follow up with your Primary Care Provider.

## 2019-12-07 NOTE — Telephone Encounter (Signed)
Noted thank you

## 2019-12-07 NOTE — Telephone Encounter (Signed)
Erica with [facility] called and stated she called patient's daughter to try and schedule patient to come out to see patient. They will not let her come. Daughter just wants them to come pick up urine. Very non compliant. Nurses are still having problems scheduling to go to home to see patient. Nurse just wanted to make you aware.

## 2019-12-07 NOTE — Progress Notes (Signed)
   Subjective:   This service was provided via telemedicine.  Telephone encounter The patient was located at home The provider was located in provider's GI office. The patient did consent to this telephone visit and is aware of possible charges through their insurance for this visit.   The persons participating in this telemedicine service were the patient's brother, one of her daytime caregivers and I. Time spent on call: 9 minutes    Patient ID: Virginia [name], female    DOB: [birth year], 84 y.o.   MRN: [id number]  HPI Virginia [name] is an 84 year old female with a history of advanced Alzheimer's dementia, COPD, hypertension, hyperlipidemia, GERD with dyspepsia and intermittent nausea who is seen for follow-up.  Seen by telephone visit today in the setting of the COVID-19 pandemic.  Phone discussion with the patient's brother [name] and one of her caregivers who spends time with her daily from 9-9pm.  They report that she is doing fairly well though has advanced Alzheimer's.  She does have very intermittent nausea for which Zofran has been quite helpful.  She is remained on pantoprazole 40 mg twice a day.  Per her caregiver she is not complaining of abdominal pain.  She has bowel movements 1-2 times daily which reportedly contain no blood or melena.  No recent vomiting though in the past she reports nausea and rare vomiting for which Zofran is very effective.   Review of Systems As per HPI, otherwise negative  Current Medications, Allergies, Past Medical History, Past Surgical History, Family History and Social History were reviewed in [employer] record.     Objective:   Physical Exam No physical exam today, virtual visit      Assessment & Plan:  84 year old female with a history of advanced Alzheimer's dementia, COPD, hypertension, hyperlipidemia, GERD with dyspepsia and intermittent nausea who is seen for follow-up.  1.  Chronic nausea/GERD/dyspepsia --symptoms  well controlled on PPI and intermittent ondansetron.  No new or alarm symptoms.  She does have advanced Alzheimer's.  She was previously taking pantoprazole and ranitidine but with the absence of ranitidine has been using pantoprazole 40 twice daily. --Okay to continue pantoprazole 40 mg twice daily AC --Continue ondansetron ODT 4 mg every 8 hours as needed nausea; she has been using this sporadically  Can be seen as needed

## 2019-12-08 ENCOUNTER — Encounter: Payer: Self-pay | Admitting: *Deleted

## 2019-12-08 NOTE — Telephone Encounter (Signed)
Received jury summons and also request for letter stating incompetent to handle affairs.  Letter for jury summons done, to SS/NP to review and sign, and if ok to do other letter.

## 2019-12-12 ENCOUNTER — Telehealth: Payer: Self-pay | Admitting: *Deleted

## 2019-12-12 NOTE — Telephone Encounter (Signed)
I have the signed the jury duty excuse note.  I called the daughter to get further information about the letter regarding the patient's competency, and the reason needed.  She was not available, I spoke with her brother, who said she was unable to be directly reached at this time.  I have asked her to call back and provide a number where I can reach her to discuss.

## 2019-12-12 NOTE — Telephone Encounter (Signed)
I called the patient's daughter, Virginia [name].  She is requesting a letter from our office stating that Virginia [name] is not competent to make decisions.  Apparently, her daughter and son are her legal and healthcare power of attorney.  I am not sure why she needs this note, since they have paperwork in place.  She reports there have been times when she may contact social service regarding various things, specifically about an electric bed, but the representative would not assist them, because [name] was unable to give them her name or date of birth. Her daughter tells me she wants this letter in her possession just in case.  I will dictate a letter stating that she has advanced Alzheimer's disease, and that she is not competent to make her own medical, legal, or financial decisions, legal paperwork is already in place.  Her most recent memory test was MOCA-Blind 5/22.

## 2019-12-12 NOTE — Telephone Encounter (Signed)
Pt daughter called left her phone number [phone] waiting on a call from [place].

## 2019-12-13 NOTE — Telephone Encounter (Signed)
Letter to MR to call daughter to pick up.

## 2019-12-15 DIAGNOSIS — I130 Hypertensive heart and chronic kidney disease with heart failure and stage 1 through stage 4 chronic kidney disease, or unspecified chronic kidney disease: Secondary | ICD-10-CM | POA: Diagnosis not present

## 2019-12-15 DIAGNOSIS — S72141D Displaced intertrochanteric fracture of right femur, subsequent encounter for closed fracture with routine healing: Secondary | ICD-10-CM | POA: Diagnosis not present

## 2019-12-15 DIAGNOSIS — M545 Low back pain: Secondary | ICD-10-CM | POA: Diagnosis not present

## 2019-12-15 DIAGNOSIS — S32030D Wedge compression fracture of third lumbar vertebra, subsequent encounter for fracture with routine healing: Secondary | ICD-10-CM | POA: Diagnosis not present

## 2019-12-15 DIAGNOSIS — I5032 Chronic diastolic (congestive) heart failure: Secondary | ICD-10-CM | POA: Diagnosis not present

## 2019-12-15 DIAGNOSIS — F0391 Unspecified dementia with behavioral disturbance: Secondary | ICD-10-CM | POA: Diagnosis not present

## 2019-12-20 ENCOUNTER — Telehealth: Payer: Self-pay | Admitting: *Deleted

## 2019-12-20 NOTE — Telephone Encounter (Signed)
Contacted residence and left a voice message to arrange a date/time to make a home visit. Awaiting return call.

## 2019-12-22 DIAGNOSIS — I130 Hypertensive heart and chronic kidney disease with heart failure and stage 1 through stage 4 chronic kidney disease, or unspecified chronic kidney disease: Secondary | ICD-10-CM | POA: Diagnosis not present

## 2019-12-22 DIAGNOSIS — S72141D Displaced intertrochanteric fracture of right femur, subsequent encounter for closed fracture with routine healing: Secondary | ICD-10-CM | POA: Diagnosis not present

## 2019-12-22 DIAGNOSIS — F0391 Unspecified dementia with behavioral disturbance: Secondary | ICD-10-CM | POA: Diagnosis not present

## 2019-12-22 DIAGNOSIS — S32030D Wedge compression fracture of third lumbar vertebra, subsequent encounter for fracture with routine healing: Secondary | ICD-10-CM | POA: Diagnosis not present

## 2019-12-22 DIAGNOSIS — N390 Urinary tract infection, site not specified: Secondary | ICD-10-CM | POA: Diagnosis not present

## 2019-12-22 DIAGNOSIS — I5032 Chronic diastolic (congestive) heart failure: Secondary | ICD-10-CM | POA: Diagnosis not present

## 2019-12-22 DIAGNOSIS — M545 Low back pain: Secondary | ICD-10-CM | POA: Diagnosis not present

## 2019-12-23 ENCOUNTER — Encounter: Payer: Self-pay | Admitting: Radiology

## 2019-12-26 DIAGNOSIS — F0391 Unspecified dementia with behavioral disturbance: Secondary | ICD-10-CM | POA: Diagnosis not present

## 2019-12-26 DIAGNOSIS — S72141D Displaced intertrochanteric fracture of right femur, subsequent encounter for closed fracture with routine healing: Secondary | ICD-10-CM | POA: Diagnosis not present

## 2019-12-26 DIAGNOSIS — I130 Hypertensive heart and chronic kidney disease with heart failure and stage 1 through stage 4 chronic kidney disease, or unspecified chronic kidney disease: Secondary | ICD-10-CM | POA: Diagnosis not present

## 2019-12-26 DIAGNOSIS — S32030D Wedge compression fracture of third lumbar vertebra, subsequent encounter for fracture with routine healing: Secondary | ICD-10-CM | POA: Diagnosis not present

## 2019-12-26 DIAGNOSIS — M545 Low back pain: Secondary | ICD-10-CM | POA: Diagnosis not present

## 2019-12-26 DIAGNOSIS — I5032 Chronic diastolic (congestive) heart failure: Secondary | ICD-10-CM | POA: Diagnosis not present

## 2019-12-27 ENCOUNTER — Other Ambulatory Visit: Payer: Medicare Other | Admitting: *Deleted

## 2019-12-27 ENCOUNTER — Other Ambulatory Visit: Payer: Self-pay

## 2019-12-27 ENCOUNTER — Telehealth: Payer: Self-pay

## 2019-12-27 DIAGNOSIS — E782 Mixed hyperlipidemia: Secondary | ICD-10-CM

## 2019-12-27 DIAGNOSIS — Z515 Encounter for palliative care: Secondary | ICD-10-CM

## 2019-12-27 NOTE — Telephone Encounter (Signed)
Orders given for lipids and CMP, these labs need to be fasting and fax to Dr Harrington Challenger as well as she requested, otherwise she has had recent labs

## 2019-12-27 NOTE — Telephone Encounter (Signed)
I called [name] (patients daughter) to inform her of urine culture results. Per handwritten note from [facility]:   No growth, please notify patient.  [name] states " While I got you on the phone, can you ask Janett Billow what labs she would like for my mother to have because a nurse from [facility] is coming out to draw labs and she is a hard stick so we need to get all labs possible with this one sick."  Order will need to be faxed to [facility]  Order is pending, Janett Billow please add the test name and associate order with all appropriate diagnosis. Once Signed I will fax.   Please advise

## 2019-12-28 NOTE — Telephone Encounter (Signed)
Order faxed to [phone]

## 2019-12-29 DIAGNOSIS — Z85828 Personal history of other malignant neoplasm of skin: Secondary | ICD-10-CM | POA: Diagnosis not present

## 2019-12-29 DIAGNOSIS — S72141D Displaced intertrochanteric fracture of right femur, subsequent encounter for closed fracture with routine healing: Secondary | ICD-10-CM | POA: Diagnosis not present

## 2019-12-29 DIAGNOSIS — I130 Hypertensive heart and chronic kidney disease with heart failure and stage 1 through stage 4 chronic kidney disease, or unspecified chronic kidney disease: Secondary | ICD-10-CM | POA: Diagnosis not present

## 2019-12-29 DIAGNOSIS — M150 Primary generalized (osteo)arthritis: Secondary | ICD-10-CM | POA: Diagnosis not present

## 2019-12-29 DIAGNOSIS — R1312 Dysphagia, oropharyngeal phase: Secondary | ICD-10-CM | POA: Diagnosis not present

## 2019-12-29 DIAGNOSIS — F0391 Unspecified dementia with behavioral disturbance: Secondary | ICD-10-CM | POA: Diagnosis not present

## 2019-12-29 DIAGNOSIS — R41841 Cognitive communication deficit: Secondary | ICD-10-CM | POA: Diagnosis not present

## 2019-12-29 DIAGNOSIS — I5032 Chronic diastolic (congestive) heart failure: Secondary | ICD-10-CM | POA: Diagnosis not present

## 2019-12-29 DIAGNOSIS — Z9181 History of falling: Secondary | ICD-10-CM | POA: Diagnosis not present

## 2019-12-29 DIAGNOSIS — S32030D Wedge compression fracture of third lumbar vertebra, subsequent encounter for fracture with routine healing: Secondary | ICD-10-CM | POA: Diagnosis not present

## 2019-12-29 DIAGNOSIS — M545 Low back pain: Secondary | ICD-10-CM | POA: Diagnosis not present

## 2019-12-30 ENCOUNTER — Other Ambulatory Visit: Payer: Self-pay

## 2019-12-30 ENCOUNTER — Other Ambulatory Visit: Payer: Medicare Other | Admitting: *Deleted

## 2019-12-30 DIAGNOSIS — Z515 Encounter for palliative care: Secondary | ICD-10-CM

## 2019-12-30 NOTE — Progress Notes (Signed)
COMMUNITY PALLIATIVE CARE RN NOTE  PATIENT NAME: Virginia [name] DOB: 06-Sep-1936 MRN: [id number]  PRIMARY CARE PROVIDER: Lauree Chandler, NP  RESPONSIBLE PARTY:  Acct ID - Guarantor Home Phone Work Phone Relationship Acct Type  [id number] Virginia, [id number]  Self P/F     [address] [place], [place] [address]   Due to the COVID-19 crisis, this virtual check-in visit was done via telephone from my office and it was initiated and consent by this patient and or family.  PLAN OF CARE and INTERVENTION:  1. ADVANCE CARE PLANNING/GOALS OF CARE: Goal is for patient to remain in her home and avoid hospitalizations. 2. PATIENT/CAREGIVER EDUCATION: Symptom Management, Aspiration Precautions 3. DISEASE STATUS: Virtual check-in visit completed via telephone. Patient does experience pain at times in her right arm, right shoulder and lower back. Tylenol continues to be given routinely to help. She remains on Oxygen at 1.5  L/min via [place]. She does have an occasional cough and intermittent wheezing. Nebulizers do help with this. Some coughing occurs during meals. Coughing also occurs when patient is eating/drinking. Eating poses more of a problem than drinking. It was recommended by home health nurse that patient be evaluated in the hospital for a swallowing test to know exactly what type of diet patient needs to be on. Her family does not want patient to travel outside of the home and wanted some suggestions. Education provided regarding downgrading her diet and thickening her liquid if necessary. She remains non-ambulatory. She is transferred with 2 person assistance. She continues with some intermittent agitation, but medication provided in the past seemed to cause increased confusion so they have not been using this. She is intermittently incontinent of bladder. She is transferred to the bedside commode for bowel movements. She receives caregivers through [facility] 9 hours/day. Will continue to  monitor.  HISTORY OF PRESENT ILLNESS: This is a 84 yo female who resides at home. Her son, Virginia [name], and younger brother, Virginia [name] currently lives with to help with care. Her daughter, Virginia [name] is also present to help. Palliative care team continues to follow patient. Next visit scheduled for 12/30/19 at 3:00 pm.  CODE STATUS: Full code ADVANCED DIRECTIVES: Y MOST FORM: no PPS: 30%   (Duration of visit and documentation 30 minutes)   Virginia Eastern, RN BSN

## 2020-01-02 ENCOUNTER — Encounter: Payer: Self-pay | Admitting: Physical Medicine and Rehabilitation

## 2020-01-02 NOTE — Progress Notes (Signed)
Virginia [name] - 84 y.o. female MRN [id number]  Date of birth: 07-08-1936  Office Visit Note: Visit Date: 11/30/2019 PCP: Lauree Chandler, NP Referred by: Lauree Chandler, NP  Subjective: Chief Complaint  Patient presents with  . Lower Back - Pain   HPI: Virginia [name] is a 84 y.o. female who comes in today At the request of Dr. Eduard Roux for evaluation management of chronic worsening low back pain and some swelling of the right leg.  The swelling in the right leg is somewhat better but is been going on for some time.  She actually had had evaluation for this.  She reports 6 weeks of worsening lower back pain really just above the belt line.  This is worse with standing and ambulating.  She had an MRI from June of this year showing compression wedge fracture that was acute at L3.  She however reports this is new her pain over the course of the last 6 weeks.  She does state that she had a fall and that it seemed like she was lower down without a large jolt.  This was with transferring.  She has a history of congestive heart failure as well as dementia and is having palliative care coming in the home at this point.  She has not had lumbar spine surgery.  Reviewing MRI of the lumbar spine with the patient and caregiver today shows really mild for her age lumbar spondylosis and facet arthropathy without disc herniation or stenosis.  She was initially seen by Dr. Melina Schools at [facility] and unfortunately I have his notes to review but I can see where he looked at spine films for her and did evaluate her.  She has not had kyphoplasty of that level.  There is some concern with the patient and caregivers about compression fracture status post this fall.  She is not endorsing any paresthesias or focal weakness.  No bowel or bladder changes no other red flag complaints.  Review of Systems  Constitutional: Negative for chills, fever, malaise/fatigue and weight loss.  HENT: Negative for hearing loss  and sinus pain.   Eyes: Negative for blurred vision, double vision and photophobia.  Respiratory: Negative for cough and shortness of breath.   Cardiovascular: Negative for chest pain, palpitations and leg swelling.  Gastrointestinal: Negative for abdominal pain, nausea and vomiting.  Genitourinary: Negative for flank pain.  Musculoskeletal: Positive for back pain, falls and joint pain. Negative for myalgias.  Skin: Negative for itching and rash.  Neurological: Negative for tremors, focal weakness and weakness.  Endo/Heme/Allergies: Negative.   Psychiatric/Behavioral: Negative for depression.  All other systems reviewed and are negative.  Otherwise per HPI.  Assessment & Plan: Visit Diagnoses:  1. Closed wedge compression fracture of L3 vertebra with routine healing, subsequent encounter   2. Spondylosis without myelopathy or radiculopathy, lumbar region   3. Chronic bilateral low back pain without sciatica   4. Falls frequently   5. Primary osteoarthritis involving multiple joints     Plan: Findings:  Chronic back pain worsened this year with compression fracture at L3 evaluated by Dr. Melina Schools.  This was acute in June.  Typically by now would expect a lot of that pain to have resolved.  She has new pain after fall.  Exam is nonfocal.  She does have palliative care coming into the house now and suffers from congestive heart failure as well as dementia.  At this point I think given the fall history  and the prior compression fracture we do need to get an MRI of the lumbar spine to see if there is been any new compression fracture.  Would also entertain the idea of lumbar support and current therapy.  Injections we would hold off until we can see the MRI.  Patient is really doing fairly well from a pain standpoint overall but is just worried about the fall and the newer onset of back pain.    Meds & Orders: No orders of the defined types were placed in this encounter.   Orders Placed  This Encounter  Procedures  . MR LUMBAR SPINE WO CONTRAST    Follow-up: Return for MRI review after completion.   Procedures: No procedures performed  No notes on file   Clinical History: MRI LUMBAR SPINE WITHOUT CONTRAST  TECHNIQUE: Multiplanar, multisequence MR imaging of the lumbar spine was performed. No intravenous contrast was administered.  COMPARISON:  None available.  FINDINGS: Segmentation: Standard. Lowest well-formed disc labeled the L5-S1 level.  Alignment: Trace retrolisthesis of L2 on L3. Underlying mild left convex scoliosis. Alignment otherwise within normal limits.  Vertebrae: Acute to subacute compression fractures seen involving the superior aspect of the L3 vertebral body with up to 30% height loss without bony retropulsion. This is benign/mechanical in appearance.  Vertebral body heights otherwise maintained without evidence for acute or chronic fracture. Underlying bone marrow signal intensity mildly heterogeneous but within normal limits. No discrete or worrisome osseous lesions. No other abnormal marrow edema.  Conus medullaris and cauda equina: Conus extends to the T12-L1 level. Conus and cauda equina appear normal.  Paraspinal and other soft tissues: Paraspinous soft tissues within normal limits. 13 mm simple left renal cyst noted. Visualized visceral structures otherwise unremarkable.  Disc levels:  L1-2:  Unremarkable.  L2-3: Mild annular disc bulge with disc desiccation. Mild facet and ligament flavum hypertrophy. No significant canal or foraminal stenosis.  L3-4: Mild diffuse disc bulge with disc desiccation and intervertebral disc space narrowing. Mild facet and ligament flavum hypertrophy. Associated trace bilateral joint effusions. Resultant mild bilateral lateral recess narrowing. Central canal remains patent. No significant foraminal encroachment.  L4-5: Mild diffuse disc bulge with disc desiccation. Mild  to moderate facet and ligament flavum hypertrophy. No significant spinal stenosis. Foramina remain patent.  L5-S1: Chronic intervertebral disc space narrowing with disc desiccation and broad posterior disc bulge. Moderate left worse than right facet hypertrophy. Mild epidural lipomatosis. No significant canal or lateral recess stenosis. Foramina remain patent.  IMPRESSION: 1. Acute to early subacute compression fracture involving the superior endplate of L3 with up to 30% height loss without bony retropulsion. This is benign/mechanical in appearance. 2. Mild for age multilevel degenerative spondylolysis as above. Resultant mild bilateral lateral recess narrowing at L3-4. No other significant canal or foraminal stenosis. No impingement.   Electronically Signed   By: Jeannine Boga M.D.   On: 05/25/2019 14:35   She reports that she quit smoking about 9 months ago. Her smoking use included cigarettes. She has a 5.00 pack-year smoking history. She has never used smokeless tobacco. No results for input(s): HGBA1C, LABURIC in the last 8760 hours.  Objective:  VS:  HT:    WT:   BMI:     BP:131/74  HR:92bpm  TEMP: ( )  RESP:  Physical Exam Vitals and nursing note reviewed.  Constitutional:      General: She is not in acute distress.    Appearance: Normal appearance. She is well-developed.  HENT:  Head: Normocephalic and atraumatic.     Nose: Nose normal.     Mouth/Throat:     Mouth: Mucous membranes are moist.     Pharynx: Oropharynx is clear.  Eyes:     Conjunctiva/sclera: Conjunctivae normal.     Pupils: Pupils are equal, round, and reactive to light.  Cardiovascular:     Rate and Rhythm: Regular rhythm.  Pulmonary:     Effort: Pulmonary effort is normal. No respiratory distress.  Abdominal:     General: There is no distension.     Palpations: Abdomen is soft.     Tenderness: There is no guarding.  Musculoskeletal:     Cervical back: Normal range of motion  and neck supple.     Right lower leg: No edema.     Left lower leg: No edema.     Comments: Patient has difficult time going from sit to stand she does have pain with extension of the lumbar spine.  No pain over the greater trochanters.  She has some pain with rocking over the upper lumbar region more than the lower lumbar region.  No focal trigger points noted.  No pain with hip rotation.  Skin:    General: Skin is warm and dry.     Findings: No erythema or rash.  Neurological:     General: No focal deficit present.     Mental Status: She is alert and oriented to person, place, and time.     Motor: No abnormal muscle tone.     Coordination: Coordination normal.     Gait: Gait normal.  Psychiatric:        Mood and Affect: Mood normal.        Behavior: Behavior normal.     Ortho Exam Imaging: No results found.  Past Medical/Family/Surgical/Social History: Medications & Allergies reviewed per EMR, new medications updated. Patient Active Problem List   Diagnosis Date Noted  . Displaced intertrochanteric fracture of right femur ([place]) 04/21/2019  . Closed intertrochanteric fracture of hip, right, initial encounter ([place]) 04/20/2019  . Leukocytosis 04/20/2019  . Fall at home, initial encounter 04/20/2019  . Primary osteoarthritis involving multiple joints 07/08/2017  . Dementia with behavioral disturbance ([place]) 07/08/2017  . Anxiety 07/08/2017  . Gastroesophageal reflux disease 07/08/2017  . Hypotension 02/01/2017  . CKD (chronic kidney disease), stage III 02/01/2017  . Cough 01/31/2016  . Vitamin D deficiency 02/15/2015  . Hyperlipidemia 01/18/2015  . COPD GOLD IV 12/30/2014  . Chronic diastolic CHF (congestive heart failure) ([place]) 12/08/2014  . Dyspnea   . Hyponatremia 12/02/2014  . Essential hypertension 12/02/2014  . Nausea and vomiting 11/30/2014  . Memory disorder 10/24/2014  . Carotid artery disease ([place]) 10/19/2012   Past Medical History:  Diagnosis Date  . Anxiety  07/08/2017  . Arthritis   . Carotid artery disease ([place])    a. s/p L CEA. b. followed by [facility].  . Carotid stenosis 10/19/2012  . Chronic diastolic CHF (congestive heart failure) ([place])   . CKD (chronic kidney disease), stage III   . Congestive heart disease ([place])   . COPD (chronic obstructive pulmonary disease) ([place])   . COPD GOLD IV 12/30/2014   PFTs 12/15/14 FEV1  0.57 (29%) ratio 54 p 17% resp to saba    . Cough 01/31/2016  . Dementia with behavioral disturbance ([place]) 07/08/2017  . Diverticulosis   . Dyspnea   . Essential hypertension 12/02/2014  . Fall at home Sept. 2013  . Family history of adverse reaction to  anesthesia    SON IS SLOW TO WAKE   . Gastroesophageal reflux disease 07/08/2017  . Hyperlipidemia   . Hypertension   . Hyponatremia 12/02/2014  . Hypotension 02/01/2017  . Incontinent of urine   . Internal hemorrhoid   . Intertrochanteric fracture ([place]) 04/20/2019  . Memory disorder 10/24/2014  . Nausea and vomiting 11/30/2014  . Primary osteoarthritis involving multiple joints 07/08/2017  . Vitamin D deficiency 02/15/2015   Family History  Problem Relation Age of Onset  . Heart disease Father        Before age 2  . Hypertension Father   . Heart attack Father   . Cancer Mother 71       Brain  . Dementia Brother   . Cancer Maternal Aunt 61       breast cancer  . Diabetes Maternal Aunt   . High blood pressure Brother   . Hypothyroidism Daughter   . High blood pressure Son   . High Cholesterol Son   . Diabetes Son   . COPD Son   . Obesity Son   . Heart failure Maternal Grandmother   . Diabetes Maternal Grandmother   . Stroke Neg Hx    Past Surgical History:  Procedure Laterality Date  . APPENDECTOMY    . BREAST REDUCTION SURGERY    . CAROTID ENDARTERECTOMY     left CEA  . CATARACT EXTRACTION Bilateral   . COLONOSCOPY  2015  . EYE SURGERY     cataracts removed, bilaterally   . INTRAMEDULLARY (IM) NAIL INTERTROCHANTERIC Right 04/21/2019   Procedure: INTRAMEDULLARY  (IM) NAIL INTERTROCHANTRIC;  Surgeon: Rod Can, MD;  Location: [facility];  Service: Orthopedics;  Laterality: Right;  . skin cancer removal    . TONSILLECTOMY     Social History   Occupational History  . Occupation: retired  Tobacco Use  . Smoking status: Former Smoker    Packs/day: 0.25    Years: 20.00    Pack years: 5.00    Types: Cigarettes    Quit date: 04/2019    Years since quitting: 0.7  . Smokeless tobacco: Never Used  . Tobacco comment: 4 or 5 cigarettes per day average  Substance and Sexual Activity  . Alcohol use: Yes    Alcohol/week: 0.0 standard drinks    Comment: occasionally   . Drug use: No  . Sexual activity: Not Currently    Birth control/protection: Post-menopausal

## 2020-01-03 DIAGNOSIS — F0391 Unspecified dementia with behavioral disturbance: Secondary | ICD-10-CM | POA: Diagnosis not present

## 2020-01-03 DIAGNOSIS — M545 Low back pain: Secondary | ICD-10-CM | POA: Diagnosis not present

## 2020-01-03 DIAGNOSIS — S32030D Wedge compression fracture of third lumbar vertebra, subsequent encounter for fracture with routine healing: Secondary | ICD-10-CM | POA: Diagnosis not present

## 2020-01-03 DIAGNOSIS — I130 Hypertensive heart and chronic kidney disease with heart failure and stage 1 through stage 4 chronic kidney disease, or unspecified chronic kidney disease: Secondary | ICD-10-CM | POA: Diagnosis not present

## 2020-01-03 DIAGNOSIS — I5032 Chronic diastolic (congestive) heart failure: Secondary | ICD-10-CM | POA: Diagnosis not present

## 2020-01-03 DIAGNOSIS — S72141D Displaced intertrochanteric fracture of right femur, subsequent encounter for closed fracture with routine healing: Secondary | ICD-10-CM | POA: Diagnosis not present

## 2020-01-03 NOTE — Progress Notes (Signed)
COMMUNITY PALLIATIVE CARE RN NOTE  PATIENT NAME: Virginia [name] DOB: [birth year] MRN: [id number]  PRIMARY CARE PROVIDER: Lauree Chandler, NP  RESPONSIBLE PARTY:  Acct ID - Guarantor Home Phone Work Phone Relationship Acct Type  [id number] Virginia [name]* [phone]  Self P/F     [address] [place], [place] [address]   Covid-19 Pre-screening Negative  PLAN OF CARE and INTERVENTION:  1. ADVANCE CARE PLANNING/GOALS OF CARE: Goal is for patient to remain in her home. She is a Full code. 2. PATIENT/CAREGIVER EDUCATION: Symptom management, Aspiration Precautions 3. DISEASE STATUS: Met with patient, her daughter, Virginia [name] and brothers, Virginia [name] and Virginia [name] in patient's home. Upon arrival, patient is sitting up in her recliner awake and alert. She is able to answer simple questions with short replies. She did jerk her right arm/hand back when her daughter touched it and yelled out "ouch." She does have some pain in her R shoulder, R knee and lower back. They continue to give her Tylenol 2-3 times/day which is helpful. Her daughter, Virginia [name], is concerned about patient aspirating. She seems to have more issues with swallowing food vs fluids. She coughs at times during meals. Her home health agency recommended that patient be seen to have a Speech Therapy consult at the hospital in order to see what type of diet is recommended. Family does not want patient travelling outside of the home to have this evaluation done. Suggestions offered on how to make foods easier to swallow. Her appetite is variable. Today, patient ate well. Virginia [name] reports that she ate 100% of a ham biscuit for breakfast and 1/2 small blueberry muffin. She also drank some coffee and took all of her medications without refusals. Her medications are given crushed in applesauce. She does refuse food and medications at times, and family has to re-approach. Daughter was interested in thickening patient's liquids. I recommended that they start with  nectar thickened beverages to see if this works. Also recommended that they not use straws for patient, as she is having difficulties sipping drinks with a straw and also advised that it is better for patient's with dysphagia not to use straws. She does continue with some intermittent wheezing, but is afebrile. Daughter reports patient having a wet cough recently. They recently placed a humidifier bottle on her oxygen, which is at 2L/min via , however water kept getting in the line so they removed the humidifier. I suggested using a small humidifier beside patient's bed during the night to see if this helps. They are using nebulizer treatments to help. She continues to have episodes of agitation, especially if she does not want to be bothered. Last night, caregivers had a difficult time getting patient ready for bed and placed in bed. She requires 2 person assistance with transfers at all times. She had recent urinalysis which was negative for a UTI. She did have some recent issues with constipation. She was disimpacted this morning, and patient evacuated a very large piece of stool. They give patient Miralax daily. Recommended that they add stool softeners to her regimen to see if this helps. She continues with hired caregivers through [facility] 9 hours/day to assist patient with personal care. Will continue to monitor.      HISTORY OF PRESENT ILLNESS:  This is a 84 yo female who lives in her home. Her son and younger brother also reside in the home with patient. Palliative care team continues to follow patient. Will continue to visit monthly and PRN.  CODE STATUS: Full Code  ADVANCED DIRECTIVES: Y MOST FORM: no PPS: 30%   (Duration of visit and documentation 75 minutes)   Virginia Eastern, RN BSN

## 2020-01-04 DIAGNOSIS — F0391 Unspecified dementia with behavioral disturbance: Secondary | ICD-10-CM | POA: Diagnosis not present

## 2020-01-04 DIAGNOSIS — S72141D Displaced intertrochanteric fracture of right femur, subsequent encounter for closed fracture with routine healing: Secondary | ICD-10-CM | POA: Diagnosis not present

## 2020-01-04 DIAGNOSIS — I130 Hypertensive heart and chronic kidney disease with heart failure and stage 1 through stage 4 chronic kidney disease, or unspecified chronic kidney disease: Secondary | ICD-10-CM | POA: Diagnosis not present

## 2020-01-04 DIAGNOSIS — S32030D Wedge compression fracture of third lumbar vertebra, subsequent encounter for fracture with routine healing: Secondary | ICD-10-CM | POA: Diagnosis not present

## 2020-01-04 DIAGNOSIS — M545 Low back pain: Secondary | ICD-10-CM | POA: Diagnosis not present

## 2020-01-04 DIAGNOSIS — I5032 Chronic diastolic (congestive) heart failure: Secondary | ICD-10-CM | POA: Diagnosis not present

## 2020-01-09 DIAGNOSIS — S72141D Displaced intertrochanteric fracture of right femur, subsequent encounter for closed fracture with routine healing: Secondary | ICD-10-CM | POA: Diagnosis not present

## 2020-01-09 DIAGNOSIS — I130 Hypertensive heart and chronic kidney disease with heart failure and stage 1 through stage 4 chronic kidney disease, or unspecified chronic kidney disease: Secondary | ICD-10-CM | POA: Diagnosis not present

## 2020-01-09 DIAGNOSIS — F0391 Unspecified dementia with behavioral disturbance: Secondary | ICD-10-CM | POA: Diagnosis not present

## 2020-01-09 DIAGNOSIS — I5032 Chronic diastolic (congestive) heart failure: Secondary | ICD-10-CM | POA: Diagnosis not present

## 2020-01-09 DIAGNOSIS — M545 Low back pain: Secondary | ICD-10-CM | POA: Diagnosis not present

## 2020-01-09 DIAGNOSIS — S32030D Wedge compression fracture of third lumbar vertebra, subsequent encounter for fracture with routine healing: Secondary | ICD-10-CM | POA: Diagnosis not present

## 2020-01-12 ENCOUNTER — Telehealth: Payer: Self-pay

## 2020-01-12 NOTE — Telephone Encounter (Signed)
01-12-20 Pt's daughter would like lab results drawn by [facility]. Please call [phone]   Thanks renee

## 2020-01-12 NOTE — Telephone Encounter (Signed)
Daughter is going to call palliative care in regards to lab results as they have not resulted here yet.

## 2020-01-16 DIAGNOSIS — I130 Hypertensive heart and chronic kidney disease with heart failure and stage 1 through stage 4 chronic kidney disease, or unspecified chronic kidney disease: Secondary | ICD-10-CM | POA: Diagnosis not present

## 2020-01-16 DIAGNOSIS — F0391 Unspecified dementia with behavioral disturbance: Secondary | ICD-10-CM | POA: Diagnosis not present

## 2020-01-16 DIAGNOSIS — I5032 Chronic diastolic (congestive) heart failure: Secondary | ICD-10-CM | POA: Diagnosis not present

## 2020-01-16 DIAGNOSIS — S72141D Displaced intertrochanteric fracture of right femur, subsequent encounter for closed fracture with routine healing: Secondary | ICD-10-CM | POA: Diagnosis not present

## 2020-01-16 DIAGNOSIS — M545 Low back pain: Secondary | ICD-10-CM | POA: Diagnosis not present

## 2020-01-16 DIAGNOSIS — S32030D Wedge compression fracture of third lumbar vertebra, subsequent encounter for fracture with routine healing: Secondary | ICD-10-CM | POA: Diagnosis not present

## 2020-01-17 ENCOUNTER — Telehealth: Payer: Self-pay | Admitting: Internal Medicine

## 2020-01-17 NOTE — Telephone Encounter (Signed)
Noted.  She can have it done at the office visit if they are unable to obtain prior.

## 2020-01-17 NOTE — Telephone Encounter (Signed)
Nikki from [facility] they have been trying to get the patient's blood work, but have been unsuccessful. She states they will attempt again this week before her appointment 2/22, but is concerned the bad weather may interrupt their attempt.

## 2020-01-19 ENCOUNTER — Other Ambulatory Visit: Payer: Self-pay

## 2020-01-19 ENCOUNTER — Other Ambulatory Visit: Payer: Medicare Other | Admitting: *Deleted

## 2020-01-19 DIAGNOSIS — Z515 Encounter for palliative care: Secondary | ICD-10-CM

## 2020-01-19 NOTE — Progress Notes (Signed)
COMMUNITY PALLIATIVE CARE RN NOTE  PATIENT NAME: Virginia MATTIOLI DOB: 1936-08-05 MRN: [id number]  PRIMARY CARE PROVIDER: Lauree Chandler, NP  RESPONSIBLE PARTY:  Acct ID - Guarantor Home Phone Work Phone Relationship Acct Type  [id number] Virginia [name], [id number]  Self P/F     [address] [place], [place] [address]   Due to the COVID-19 crisis, this virtual check-in visit was done via telephone from my office and it was initiated and consent by this patient and or family.  PLAN OF CARE and INTERVENTION:  1. ADVANCE CARE PLANNING/GOALS OF CARE: Goal is for patient to remain in her home and avoid hospitalizations. 2. PATIENT/CAREGIVER EDUCATION: Symptom management 3. DISEASE STATUS: Virtual check-in visit completed via telephone. Patient is currently lying in bed, and has not wanted to get up as of yet today. She is intermittently confused and forgetful, but able to answer simple questions.She does have some agitation. It was more prevalent last week. They feel this may have been d/t new staff coming in that she was not familiar with. Her sister is in town visiting, and she is still able to recognize her. No pain reported at this time, but she does experience pain in her right hand/arm when touched. Some swelling noted and hired caregivers try to keep her arm elevated to help. She is also taking Tylenol for pain. She continues on Oxygen at 1.5L via [place]. Intermittent coughing. Continues with nebulizer treatments to help. She has hired caregivers through [facility] for 9 hours/day. She requires 2 person assistance with transfers. Spoke with hired caregiver, Trevor Mace, who states that they have to hold patient up and use their weight to transfer her. She is transported via wheelchair. She requires assistance with all ADLs. Her appetite is "hit or miss." There are times where they have to crush her medications, and other times she is able to take it whole. She is a high aspiration risk. She is incontinent of  both bowel and bladder and wears briefs. She has a small tear in the crease of her buttocks. Area is open, but no drainage noted. They are applying Zinc oxide cream to help. Will continue to monitor.    HISTORY OF PRESENT ILLNESS: This is a 84 yo female with a diagnosis of Dementia and CHF. Palliative care continues to follow patient. Will continue to visit patient monthly and PRN.  CODE STATUS: Full code  ADVANCED DIRECTIVES: Y MOST FORM: no PPS: 30%    (Duration of visit and documentation 45 minutes)    [name], RN BSN

## 2020-01-21 NOTE — Progress Notes (Signed)
Virtual Visit via Telephone Note   This visit type was conducted due to national recommendations for restrictions regarding the COVID-19 Pandemic (e.g. social distancing) in an effort to limit this patient's exposure and mitigate transmission in our community.  Due to her co-morbid illnesses, this patient is at least at moderate risk for complications without adequate follow up.  This format is felt to be most appropriate for this patient at this time.  The patient did not have access to video technology/had technical difficulties with video requiring transitioning to audio format only (telephone).  All issues noted in this document were discussed and addressed.  No physical exam could be performed with this format.  Please refer to the patient's chart for her  consent to telehealth for [facility].   Date:  01/23/2020   ID:  LURIA SONDERGAARD, DOB September 28, 1936, MRN [id number]  Patient Location: Home Provider Location: Office  PCP:  Lauree Chandler, NP  Cardiologist:  Dorris Carnes, MD    Evaluation Performed:  Follow-Up Visit  Chief Complaint:    History of Present Illness:    Virginia [name] is a 84 y.o. female with medical history of chronic diastolic CHF, COPD Gold 4 on home oxygen, hypertension, hyperlipidemia, left carotid artery stenosis s/p left CEA, CKD stage III, dementia and anxiety. 2D echo 2016 LVEF 65 to 70% with grade 1 DD.  She is on Lasix 40 mg daily for lower extremity edema.  The patient had a fall in May with right hip fracture and she underwent surgical hip repair on 04/21/2019.  She is followed by palliative care and is residing in her home with hired caregiver 12 hours daily for personal care needs.  Her son and younger brother live with her.  Notes indicate that the patient has had progressive confusion and had had prior aggression but is improved on Zoloft daily and Xanax as needed.   Brother says pt is sleepier Palliative care is there 9 to 25   Weekday is OK    Weekend is more turn around   More employees  She has no CP   She is on 1.5 L   Sats OK    Appetite is OK   No LE edema   The patient does not have symptoms concerning for COVID-19 infection (fever, chills, cough, or new shortness of breath).    Past Medical History:  Diagnosis Date  . Anxiety 07/08/2017  . Arthritis   . Carotid artery disease ([place])    a. s/p L CEA. b. followed by [facility].  . Carotid stenosis 10/19/2012  . Chronic diastolic CHF (congestive heart failure) ([place])   . CKD (chronic kidney disease), stage III   . Congestive heart disease ([place])   . COPD (chronic obstructive pulmonary disease) ([place])   . COPD GOLD IV 12/30/2014   PFTs 12/15/14 FEV1  0.57 (29%) ratio 54 p 17% resp to saba    . Cough 01/31/2016  . Dementia with behavioral disturbance ([place]) 07/08/2017  . Diverticulosis   . Dyspnea   . Essential hypertension 12/02/2014  . Fall at home Sept. 2013  . Family history of adverse reaction to anesthesia    SON IS SLOW TO WAKE   . Gastroesophageal reflux disease 07/08/2017  . Hyperlipidemia   . Hypertension   . Hyponatremia 12/02/2014  . Hypotension 02/01/2017  . Incontinent of urine   . Internal hemorrhoid   . Intertrochanteric fracture ([place]) 04/20/2019  . Memory disorder 10/24/2014  . Nausea and vomiting  11/30/2014  . Primary osteoarthritis involving multiple joints 07/08/2017  . Vitamin D deficiency 02/15/2015   Past Surgical History:  Procedure Laterality Date  . APPENDECTOMY    . BREAST REDUCTION SURGERY    . CAROTID ENDARTERECTOMY     left CEA  . CATARACT EXTRACTION Bilateral   . COLONOSCOPY  2015  . EYE SURGERY     cataracts removed, bilaterally   . INTRAMEDULLARY (IM) NAIL INTERTROCHANTERIC Right 04/21/2019   Procedure: INTRAMEDULLARY (IM) NAIL INTERTROCHANTRIC;  Surgeon: Rod Can, MD;  Location: [facility];  Service: Orthopedics;  Laterality: Right;  . skin cancer removal    . TONSILLECTOMY       Current Meds  Medication Sig  . acetaminophen (TYLENOL) 500 MG  tablet Take 500 mg by mouth 3 (three) times daily as needed.  . AMBULATORY NON FORMULARY MEDICATION 1.Home health Nurse to cleanse right shin area skin tear with saline,pat dry,apply triple antibiotic ointment to wound bed and cover with foam dressing for protection and absorption.change dressing every 3 days and as needed if soiled.Notify provider for any yellow drainage or if wound worsen.  2. Apply foam dressing to left lower leg bruise area for extra protection.  Marland Kitchen aspirin EC 81 MG tablet Take 81 mg by mouth daily.  . Carbinoxamine Maleate 4 MG TABS Take 1 tablet (4 mg total) by mouth every 8 (eight) hours as needed.  . Cholecalciferol (D3 SUPER STRENGTH) 2000 units CAPS Take 1 capsule (2,000 Units total) by mouth daily.  Marland Kitchen donepezil (ARICEPT) 10 MG tablet Take 1 tablet (10 mg total) by mouth daily.  . furosemide (LASIX) 20 MG tablet TAKE 1 TABLET BY MOUTH ONCE DAILY  . ipratropium-albuterol (DUONEB) 0.5-2.5 (3) MG/3ML SOLN inhale contents of 1 vial in nebulizer every 6 hours  . memantine (NAMENDA) 10 MG tablet TAKE 1 TABLET BY MOUTH TWICE DAILY  . ondansetron (ZOFRAN-ODT) 4 MG disintegrating tablet Take 4 mg by mouth as needed for nausea or vomiting.  Marland Kitchen OVER THE COUNTER MEDICATION Mucinex guaifenesin liquid 200 mg 10 ml BID  . pantoprazole (PROTONIX) 40 MG tablet TAKE 1 TABLET(40 MG) BY MOUTH TWICE DAILY  . propranolol (INDERAL) 20 MG tablet TAKE 1 TABLET(20 MG) BY MOUTH TWICE DAILY  . rosuvastatin (CRESTOR) 20 MG tablet Take 1 tablet (20 mg total) by mouth daily.  . sertraline (ZOLOFT) 50 MG tablet Take 2 tablets (100 mg total) by mouth daily.  Marland Kitchen UNABLE TO FIND Med Name: right hand compression glove wear daily for edema.     Allergies:   Lidocaine and Other   Social History   Tobacco Use  . Smoking status: Former Smoker    Packs/day: 0.25    Years: 20.00    Pack years: 5.00    Types: Cigarettes    Quit date: 04/2019    Years since quitting: 0.8  . Smokeless tobacco: Never Used    . Tobacco comment: 4 or 5 cigarettes per day average  Substance Use Topics  . Alcohol use: Yes    Alcohol/week: 0.0 standard drinks    Comment: occasionally   . Drug use: No     Family Hx: The patient's family history includes COPD in her son; Cancer (age of onset: 49) in her mother; Cancer (age of onset: 51) in her maternal aunt; Dementia in her brother; Diabetes in her maternal aunt, maternal grandmother, and son; Heart attack in her father; Heart disease in her father; Heart failure in her maternal grandmother; High Cholesterol in her son; High  blood pressure in her brother and son; Hypertension in her father; Hypothyroidism in her daughter; Obesity in her son. There is no history of Stroke.  ROS:   Please see the history of present illness.     All other systems reviewed and are negative.     Labs/Other Tests and Data Reviewed:    EKG:  No ECG reviewed.  Recent Labs: 11/02/2019: ALT 8; BUN 24; Creatinine, Ser 0.99; Hemoglobin 12.8; Platelets 240; Potassium 4.0; Sodium 144; TSH 0.561   Recent Lipid Panel Lab Results  Component Value Date/Time   CHOL 336 (H) 11/02/2019 02:11 PM   TRIG 417 (H) 11/02/2019 02:11 PM   HDL 42 11/02/2019 02:11 PM   CHOLHDL 8.0 (H) 11/02/2019 02:11 PM   CHOLHDL 2.9 12/10/2017 01:49 PM   LDLCALC 207 (H) 11/02/2019 02:11 PM   LDLCALC 111 (H) 12/10/2017 01:49 PM   LDLDIRECT 142.0 05/24/2015 11:08 AM    Wt Readings from Last 3 Encounters:  04/21/19 178 lb (80.7 kg)  12/14/18 190 lb (86.2 kg)  12/07/18 187 lb 1.9 oz (84.9 kg)     Objective:    Vital Signs:  Ht 5\' 2"  (1.575 m)   LMP  (LMP Unknown)   BMI 32.56 kg/m    No vitals to review   ASSESSMENT & PLAN:    1.   Chronic diastolic CHF   Pt's brother says she has no edema   Labs in Dec were OK    Continue same meds    2  HTN   Brother reports BP 150/90  A little high but with patient being palliative status I do not want to lower too much   3  HL   Pt with profound HL  Had been on  statin in past   Pt had multible aches so statin was discontinued  Not sure if cause for symptoms but I would not restart    COVID-19 Education: The signs and symptoms of COVID-19 were discussed with the patient and how to seek care for testing (follow up with PCP or arrange E-visit).  The importance of social distancing was discussed today.  Time:   Today, I have spent  15  minutes with the patient with telehealth technology discussing the above problems.     Medication Adjustments/Labs and Tests Ordered: Current medicines are reviewed at length with the patient today.  Concerns regarding medicines are outlined above.   Tests Ordered: No orders of the defined types were placed in this encounter.   Medication Changes: No orders of the defined types were placed in this encounter.   Follow Up: late spring virtual or in person  Signed, Dorris Carnes, MD  01/23/2020 4:30 PM    [place]

## 2020-01-23 ENCOUNTER — Encounter: Payer: Self-pay | Admitting: Internal Medicine

## 2020-01-23 ENCOUNTER — Telehealth (INDEPENDENT_AMBULATORY_CARE_PROVIDER_SITE_OTHER): Payer: Medicare Other | Admitting: Internal Medicine

## 2020-01-23 ENCOUNTER — Other Ambulatory Visit: Payer: Self-pay

## 2020-01-23 DIAGNOSIS — I10 Essential (primary) hypertension: Secondary | ICD-10-CM | POA: Diagnosis not present

## 2020-01-23 DIAGNOSIS — E782 Mixed hyperlipidemia: Secondary | ICD-10-CM | POA: Diagnosis not present

## 2020-01-23 DIAGNOSIS — I5032 Chronic diastolic (congestive) heart failure: Secondary | ICD-10-CM | POA: Diagnosis not present

## 2020-01-23 NOTE — Patient Instructions (Signed)
Medication Instructions:  No changes *If you need a refill on your cardiac medications before your next appointment, please call your pharmacy*  Lab Work: none If you have labs (blood work) drawn today and your tests are completely normal, you will receive your results only by: Marland Kitchen MyChart Message (if you have MyChart) OR . A paper copy in the mail If you have any lab test that is abnormal or we need to change your treatment, we will call you to review the results.  Testing/Procedures: none  Follow-Up: At [facility], you and your health needs are our priority.  As part of our continuing mission to provide you with exceptional heart care, we have created designated Provider Care Teams.  These Care Teams include your primary Cardiologist (physician) and Advanced Practice Providers (APPs -  Physician Assistants and Nurse Practitioners) who all work together to provide you with the care you need, when you need it.  Your next appointment:   3 month(s)  The format for your next appointment:   Virtual Visit   Provider:   Dorris Carnes, MD  Other Instructions

## 2020-01-26 DIAGNOSIS — I130 Hypertensive heart and chronic kidney disease with heart failure and stage 1 through stage 4 chronic kidney disease, or unspecified chronic kidney disease: Secondary | ICD-10-CM | POA: Diagnosis not present

## 2020-01-26 DIAGNOSIS — I5032 Chronic diastolic (congestive) heart failure: Secondary | ICD-10-CM | POA: Diagnosis not present

## 2020-01-26 DIAGNOSIS — F0391 Unspecified dementia with behavioral disturbance: Secondary | ICD-10-CM | POA: Diagnosis not present

## 2020-01-26 DIAGNOSIS — S32030D Wedge compression fracture of third lumbar vertebra, subsequent encounter for fracture with routine healing: Secondary | ICD-10-CM | POA: Diagnosis not present

## 2020-01-26 DIAGNOSIS — S72141D Displaced intertrochanteric fracture of right femur, subsequent encounter for closed fracture with routine healing: Secondary | ICD-10-CM | POA: Diagnosis not present

## 2020-01-26 DIAGNOSIS — M545 Low back pain: Secondary | ICD-10-CM | POA: Diagnosis not present

## 2020-01-27 DIAGNOSIS — Z23 Encounter for immunization: Secondary | ICD-10-CM | POA: Diagnosis not present

## 2020-02-06 ENCOUNTER — Other Ambulatory Visit: Payer: Self-pay

## 2020-02-06 MED ORDER — CARBINOXAMINE MALEATE 4 MG PO TABS: 4.0000 mg | ORAL_TABLET | Freq: Three times a day (TID) | ORAL | 5 refills | Status: DC | PRN

## 2020-02-09 ENCOUNTER — Telehealth: Payer: Self-pay | Admitting: *Deleted

## 2020-02-09 NOTE — Telephone Encounter (Signed)
Called and left a voicemail for patient's brother, [name], to arrange a Palliative care visit. Left contact information for return call.

## 2020-02-09 NOTE — Telephone Encounter (Signed)
Received a return call from patient's daughter, Virginia [name], regarding scheduling a Palliative care visit. Visit scheduled for 02/14/20 at 1:30p.

## 2020-02-14 ENCOUNTER — Other Ambulatory Visit: Payer: Medicare Other | Admitting: *Deleted

## 2020-02-14 ENCOUNTER — Other Ambulatory Visit: Payer: Self-pay

## 2020-02-14 ENCOUNTER — Telehealth: Payer: Self-pay | Admitting: *Deleted

## 2020-02-14 DIAGNOSIS — Z515 Encounter for palliative care: Secondary | ICD-10-CM

## 2020-02-14 NOTE — Telephone Encounter (Signed)
Can you redo the letter, that was in my name and it wouldn't let me edit to take my name out.  Thanks

## 2020-02-14 NOTE — Telephone Encounter (Signed)
Letter completed in communications

## 2020-02-14 NOTE — Telephone Encounter (Signed)
Daughter [name] calling asking for a letter similar to the one [facility] gave them. The letter should state that pt can no longer make sound decisions about health or finances. Daughter would like letter from her PCP.

## 2020-02-14 NOTE — Telephone Encounter (Signed)
Spoke with daughter and advised results, letter ready for pick-up at the front desk

## 2020-02-14 NOTE — Telephone Encounter (Signed)
done

## 2020-02-15 NOTE — Progress Notes (Signed)
COMMUNITY PALLIATIVE CARE RN NOTE  PATIENT NAME: Virginia [name] DOB: 1936/03/22 MRN: [id number]  PRIMARY CARE PROVIDER: Lauree Chandler, NP  RESPONSIBLE PARTY:  Acct ID - Guarantor Home Phone Work Phone Relationship Acct Type  [id number] AMELIYA, NICOTRA* [phone]  Self P/F     [address] [place], [place] [address]   Covid-19 Pre-screening Negative  PLAN OF CARE and INTERVENTION:  1. ADVANCE CARE PLANNING/GOALS OF CARE: Goal is for patient to remain in her home and avoid hospitalizations. 2. PATIENT/CAREGIVER EDUCATION: Safe transfers, disease progression, symptom management 3. DISEASE STATUS: Met with patient, daughter, [name], son, [name], and patient's brother, [name] in patient's home. Upon arrival, patient is lying in bed asleep. She did open her eyes with verbal stimulation. She allowed me to elevate the head of her bed while her daughter had her drink a few sips of her Glucerna. She refused to take her medications. She did allow me to take her temperature and oxygen level, but refused blood pressure. At one point she asked me to leave. She denied pain. She does experience back pain and right hand pain at times. She was able to answer questions with short replies. She remains intermittently confused. She is still able to make her needs known. She is spending more time lying in bed and getting up later in the day. She is sleeping more overall, even when up in her recliner. They allow her to make the decisions as to what she wants e.g when to get out of bed, having lights on/off, whether to have music on, etc. She does become agitated and refuses care at times and hired caregivers must re-approach at a later time. She remains on oxygen at 1.5L/min via [place]. She has a non-productive cough at times. Nebulizer treatments are effective and cough medication. She requires 2 person maximum assistance with transfers. She is total care for all ADLs. She is incontinent of both bowel and bladder. Her  intake is variable, but family states she is eating less and less. She does cough at times during meals. Her medications are given crushed in applesauce. Over the weekend, she had a good day where the caregiver took her outside on the porch and patient was very pleasant and smiling. She has received her 1st dose of her Covid vaccine. We discussed criteria for hospice and her son, [name], does not feel that she is ready for hospice as of yet. She continues with 2 hired caregivers 12 hours/day. Will continue to monitor.  HISTORY OF PRESENT ILLNESS:  This is a 84 yo female with a diagnosis of Dementia and CHF, who resides at home. Palliative care continues to follow patient and visits monthly and PRN.  CODE STATUS: Full code ADVANCED DIRECTIVES: Y MOST FORM: no PPS: 30%   PHYSICAL EXAM:   VITALS: Today's Vitals   02/14/20 1344  Pulse: 79  Resp: 18  Temp: 97.8 F (36.6 C)  TempSrc: Temporal  SpO2: 97%  PainSc: 0-No pain    LUNGS: clear to auscultation  CARDIAC: Cor RRR EXTREMITIES: No edema SKIN: Exposed skin is dry and intact; caregivers deny any skin issues  NEURO: Alert to self, intermittent confusion, increased generalized weakness, maximum assistance with ADLs   (Duration of visit and documentation 135 minutes)   [name], RN BSN

## 2020-02-20 ENCOUNTER — Ambulatory Visit: Payer: Medicare Other | Admitting: Family

## 2020-02-20 ENCOUNTER — Other Ambulatory Visit: Payer: Self-pay

## 2020-02-20 ENCOUNTER — Telehealth: Payer: Self-pay | Admitting: *Deleted

## 2020-02-20 NOTE — Telephone Encounter (Signed)
Virginia [name] can you please advise per daughter?

## 2020-02-20 NOTE — Telephone Encounter (Signed)
Janett Billow requested for daughter to bring in patient for an appointment in office and cancel the Virtual for tomorrow. Stated that patient is due for an Office Visit.   I called and spoke with daughter, [name] and she stated that right now it is too much to bring patient out and they want to wait until after patient is fully vaccinated before bringing her out to Dr. Thomasene Lot. She is going for her 2nd injection on Thursday. I asked daughter if while they were out Thursday if I could schedule them an appointment to come into office to be seen and she stated that they rather wait until she is fully Vaccinated. (Covid).   I did not cancel the virtual appointment for tomorrow with Janett Billow. Please Advise.

## 2020-02-20 NOTE — Telephone Encounter (Signed)
Agree with Webb Silversmith, lets see if she can get an in office visit with her

## 2020-02-20 NOTE — Telephone Encounter (Signed)
Daughter scheduled a video visit with Janett Billow 02/21/20

## 2020-02-20 NOTE — Telephone Encounter (Signed)
Daughter is calling asking for medication for a strong urine odor, pt was put on the schedule for a telephone visit, which we can't do urine over the phone. I asked daughter if they could bring pt in, and she said no, pt has only been out to get a covid vaccine. Daughter would like order to have [facility] to come out to get urine sample and check pt's skin. BUT per daughter they have been released from [facility]. Please advise

## 2020-02-20 NOTE — Telephone Encounter (Signed)
Patient will need to be seen at the office to obtain urine specimen and assess skin.To order Home health Agency will need a face to face visit also.

## 2020-02-20 NOTE — Telephone Encounter (Signed)
Tried calling daughter Virginia [name] and her mailbox is full could not leave a message.

## 2020-02-21 ENCOUNTER — Other Ambulatory Visit: Payer: Self-pay

## 2020-02-21 ENCOUNTER — Encounter: Payer: Medicare Other | Admitting: Nurse Practitioner

## 2020-02-21 ENCOUNTER — Telehealth: Payer: Self-pay

## 2020-02-21 NOTE — Telephone Encounter (Signed)
Carlos American. Dewaine Oats, NP called the office this morning and spoke with practice administrator, Nuala Alpha. Faythe Dingwall has placed a call to patients daughter, left message, and awaiting a return call to further discuss this situation.   No further action is required from the [facility] clinical staff.

## 2020-02-21 NOTE — Progress Notes (Signed)
This encounter was created in error - please disregard.

## 2020-02-21 NOTE — Telephone Encounter (Signed)
Called patient to begin visit via telephone. Patient didn't answer so I left voicemail with office call back number. Patient was notified in voicemail that we Sherrie Mustache, NP) / ( Lonell Stamos, [place]) are working remotely in [place], [place] at [facility] which is why number is different from office number. Patient was also notified that on 3rd attempt appointment will be rescheduled.

## 2020-02-21 NOTE — Telephone Encounter (Signed)
This is second attempt to call patient. Called patient daughter cell phone and phone went to voicemail with full mailbox. I was unable to leave voicemail. Called home phone again and phone went to voicemail again. Voicemail was left for patient with office call back number.

## 2020-02-21 NOTE — Telephone Encounter (Signed)
What she is asking for is going to require an office visit, there are precautions in place due to the pandemic.

## 2020-02-21 NOTE — Telephone Encounter (Signed)
3rd attempt calling patient and no answer. Voicemail was left with office call back number to reschedule appointment.

## 2020-02-22 DIAGNOSIS — N390 Urinary tract infection, site not specified: Secondary | ICD-10-CM | POA: Diagnosis not present

## 2020-02-23 ENCOUNTER — Other Ambulatory Visit: Payer: Medicare Other

## 2020-02-24 ENCOUNTER — Other Ambulatory Visit: Payer: Medicare Other | Admitting: *Deleted

## 2020-02-24 ENCOUNTER — Other Ambulatory Visit: Payer: Self-pay

## 2020-02-24 DIAGNOSIS — E782 Mixed hyperlipidemia: Secondary | ICD-10-CM | POA: Diagnosis not present

## 2020-02-24 DIAGNOSIS — Z23 Encounter for immunization: Secondary | ICD-10-CM | POA: Diagnosis not present

## 2020-02-25 LAB — LIPID PANEL
Chol/HDL Ratio: 3.9 ratio (ref 0.0–4.4)
Cholesterol, Total: 182 mg/dL (ref 100–199)
HDL: 47 mg/dL (ref >39)
LDL Chol Calc (NIH): 87 mg/dL (ref 0–99)
Triglycerides: 294 mg/dL — ABNORMAL HIGH (ref 0–149)
VLDL Cholesterol Cal: 48 mg/dL — ABNORMAL HIGH (ref 5–40)

## 2020-02-25 LAB — AST: AST: 22 IU/L (ref 0–40)

## 2020-02-28 DIAGNOSIS — H04121 Dry eye syndrome of right lacrimal gland: Secondary | ICD-10-CM | POA: Diagnosis not present

## 2020-02-28 DIAGNOSIS — H10503 Unspecified blepharoconjunctivitis, bilateral: Secondary | ICD-10-CM | POA: Diagnosis not present

## 2020-02-28 DIAGNOSIS — B029 Zoster without complications: Secondary | ICD-10-CM | POA: Diagnosis not present

## 2020-02-29 ENCOUNTER — Telehealth: Payer: Self-pay | Admitting: Internal Medicine

## 2020-02-29 NOTE — Telephone Encounter (Signed)
Patient's daughter returning Christine's call in regards to lab results.

## 2020-03-07 NOTE — Telephone Encounter (Signed)
See results note on labs from 3/31 ./cy

## 2020-03-09 NOTE — Telephone Encounter (Signed)
I reviewed recent lab work with patient.  Her LDL has improved on Crestor.  She will continue this.  No further questions.  Reports patient is getting over shingles.

## 2020-03-09 NOTE — Telephone Encounter (Signed)
Follow Up  Patient's daughter is calling in to speak with Michaelene (Dr. Alan Ripper nurse) about patient's lab work. States that patient was not fasting the day of the blood work and would like to speak with nurse about it. Please give patient's daughter a call back about labs.

## 2020-03-20 ENCOUNTER — Telehealth: Payer: Self-pay | Admitting: *Deleted

## 2020-03-20 NOTE — Telephone Encounter (Signed)
Called and left a voicemail to schedule a Palliative care visit. Left contact information for return call.

## 2020-03-21 ENCOUNTER — Ambulatory Visit: Payer: Medicare Other | Admitting: Neurology

## 2020-03-21 NOTE — Progress Notes (Deleted)
PATIENT: Virginia [name] DOB: 1936-08-20  REASON FOR VISIT: follow up HISTORY FROM: patient  HISTORY OF PRESENT ILLNESS: Today 03/21/20  [name] is an 84 year old female with history of memory disturbance.  She remains on Aricept and Namenda.  She has in-home caregivers.  She suffered a fall in June 2020, fractured her hip and femur.  Since then, she has been unable to ambulate and has required near full assistance.  She has had agitation, she tried to have her dose of Zoloft, and Risperdal without change.  HISTORY  09/19/2019 SS: [name] is an 84 year old female with history of memory disturbance.  She remains on Aricept and Namenda.  She is tolerating the medications well.  She is living at home with different family taking turns caring for her.  She also has in-home caregivers.  She had a fall back in June and fractured her hip and femur.  Since then, she has been unable to ambulate and has required near full assistance.  She also has back and shoulder issues.  She has been working with physical therapy and was able to take a few steps.  She has a good appetite.  Her family is concerned about agitation and aggression she has had with caregivers on occasions when she is exhausted at the end of the day.  There was a time last week where she scratched and hit her caregiver.  This occurred after she had a particularly busy day.  In general she sleeps well at night.  She may have an occasional night terror.  Her family does wonder if her caregivers may be exacerbating the situation by talking loudly or having her do too much when she is already so tired.  She presents today for follow-up via virtual visit accompanied by her son [name], and daughter [name].  REVIEW OF SYSTEMS: Out of a complete 14 system review of symptoms, the patient complains only of the following symptoms, and all other reviewed systems are negative.  ALLERGIES: Allergies  Allergen Reactions  . Lidocaine Anaphylaxis,  Swelling, Rash and Other (See Comments)    Any of the " [facility] "  . Other Other (See Comments)    All drugs that end in "cane'-  novacane etc. Daughter states patient almost died when she had it when she was born    HOME MEDICATIONS: Outpatient Medications Prior to Visit  Medication Sig Dispense Refill  . acetaminophen (TYLENOL) 500 MG tablet Take 500 mg by mouth 3 (three) times daily as needed.    . AMBULATORY NON FORMULARY MEDICATION 1.Home health Nurse to cleanse right shin area skin tear with saline,pat dry,apply triple antibiotic ointment to wound bed and cover with foam dressing for protection and absorption.change dressing every 3 days and as needed if soiled.Notify provider for any yellow drainage or if wound worsen.  2. Apply foam dressing to left lower leg bruise area for extra protection. 1 application 0  . aspirin EC 81 MG tablet Take 81 mg by mouth daily.    . Carbinoxamine Maleate 4 MG TABS Take 1 tablet (4 mg total) by mouth every 8 (eight) hours as needed. 60 tablet 5  . Cholecalciferol (D3 SUPER STRENGTH) 2000 units CAPS Take 1 capsule (2,000 Units total) by mouth daily. 30 each 3  . donepezil (ARICEPT) 10 MG tablet Take 1 tablet (10 mg total) by mouth daily. 30 tablet 5  . furosemide (LASIX) 20 MG tablet TAKE 1 TABLET BY MOUTH ONCE DAILY 90 tablet 2  .  ipratropium-albuterol (DUONEB) 0.5-2.5 (3) MG/3ML SOLN inhale contents of 1 vial in nebulizer every 6 hours 360 mL 12  . memantine (NAMENDA) 10 MG tablet TAKE 1 TABLET BY MOUTH TWICE DAILY 60 tablet 5  . ondansetron (ZOFRAN-ODT) 4 MG disintegrating tablet Take 4 mg by mouth as needed for nausea or vomiting.    Marland Kitchen OVER THE COUNTER MEDICATION Mucinex guaifenesin liquid 200 mg 10 ml BID    . pantoprazole (PROTONIX) 40 MG tablet TAKE 1 TABLET(40 MG) BY MOUTH TWICE DAILY 180 tablet 1  . propranolol (INDERAL) 20 MG tablet TAKE 1 TABLET(20 MG) BY MOUTH TWICE DAILY 180 tablet 1  . rosuvastatin (CRESTOR) 20 MG tablet Take 1  tablet (20 mg total) by mouth daily. 90 tablet 3  . sertraline (ZOLOFT) 50 MG tablet Take 2 tablets (100 mg total) by mouth daily. 60 tablet 5  . UNABLE TO FIND Med Name: right hand compression glove wear daily for edema. 1 Act 0   No facility-administered medications prior to visit.    PAST MEDICAL HISTORY: Past Medical History:  Diagnosis Date  . Anxiety 07/08/2017  . Arthritis   . Carotid artery disease ([place])    a. s/p L CEA. b. followed by [facility].  . Carotid stenosis 10/19/2012  . Chronic diastolic CHF (congestive heart failure) ([place])   . CKD (chronic kidney disease), stage III   . Congestive heart disease ([place])   . COPD (chronic obstructive pulmonary disease) ([place])   . COPD GOLD IV 12/30/2014   PFTs 12/15/14 FEV1  0.57 (29%) ratio 54 p 17% resp to saba    . Cough 01/31/2016  . Dementia with behavioral disturbance ([place]) 07/08/2017  . Diverticulosis   . Dyspnea   . Essential hypertension 12/02/2014  . Fall at home Sept. 2013  . Family history of adverse reaction to anesthesia    SON IS SLOW TO WAKE   . Gastroesophageal reflux disease 07/08/2017  . Hyperlipidemia   . Hypertension   . Hyponatremia 12/02/2014  . Hypotension 02/01/2017  . Incontinent of urine   . Internal hemorrhoid   . Intertrochanteric fracture ([place]) 04/20/2019  . Memory disorder 10/24/2014  . Nausea and vomiting 11/30/2014  . Primary osteoarthritis involving multiple joints 07/08/2017  . Vitamin D deficiency 02/15/2015    PAST SURGICAL HISTORY: Past Surgical History:  Procedure Laterality Date  . APPENDECTOMY    . BREAST REDUCTION SURGERY    . CAROTID ENDARTERECTOMY     left CEA  . CATARACT EXTRACTION Bilateral   . COLONOSCOPY  2015  . EYE SURGERY     cataracts removed, bilaterally   . INTRAMEDULLARY (IM) NAIL INTERTROCHANTERIC Right 04/21/2019   Procedure: INTRAMEDULLARY (IM) NAIL INTERTROCHANTRIC;  Surgeon: Rod Can, MD;  Location: [facility];  Service: Orthopedics;  Laterality: Right;  . skin cancer removal      . TONSILLECTOMY      FAMILY HISTORY: Family History  Problem Relation Age of Onset  . Heart disease Father        Before age 23  . Hypertension Father   . Heart attack Father   . Cancer Mother 48       Brain  . Dementia Brother   . Cancer Maternal Aunt 61       breast cancer  . Diabetes Maternal Aunt   . High blood pressure Brother   . Hypothyroidism Daughter   . High blood pressure Son   . High Cholesterol Son   . Diabetes Son   . COPD Son   .  Obesity Son   . Heart failure Maternal Grandmother   . Diabetes Maternal Grandmother   . Stroke Neg Hx     SOCIAL HISTORY: Social History   Socioeconomic History  . Marital status: Divorced    Spouse name: Not on file  . Number of children: 2  . Years of education: 7  . Highest education level: Not on file  Occupational History  . Occupation: retired  Tobacco Use  . Smoking status: Former Smoker    Packs/day: 0.25    Years: 20.00    Pack years: 5.00    Types: Cigarettes    Quit date: 04/2019    Years since quitting: 0.9  . Smokeless tobacco: Never Used  . Tobacco comment: 4 or 5 cigarettes per day average  Substance and Sexual Activity  . Alcohol use: Yes    Alcohol/week: 0.0 standard drinks    Comment: occasionally   . Drug use: No  . Sexual activity: Not Currently    Birth control/protection: Post-menopausal  Other Topics Concern  . Not on file  Social History Narrative   04/05/18 lives with son and brother, [name]   Diet:       Do you drink/eat things with caffeine: yes      Marital Status: Divorced   What Year Married:1959      Do you live in a house, apartment, Assisted Living, Condo, trailer? House      Is it one or more stories? 2 stories      How many persons live in your home? Just Patient      Do you have any pets in your home? 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 Very Little   Type and how often? Walk      Do you have a living will? None   DNR?    Discuss one? No      Do you have signed POA/HPOA forms?      Patient drinks 1 cup of caffeine daily.   Patient is right handed.   Social Determinants of Health   Financial Resource Strain:   . Difficulty of Paying Living Expenses:   Food Insecurity:   . Worried About Charity fundraiser in the Last Year:   . Arboriculturist in the Last Year:   Transportation Needs:   . Film/video editor (Medical):   Marland Kitchen Lack of Transportation (Non-Medical):   Physical Activity:   . Days of Exercise per Week:   . Minutes of Exercise per Session:   Stress:   . Feeling of Stress :   Social Connections:   . Frequency of Communication with Friends and Family:   . Frequency of Social Gatherings with Friends and Family:   . Attends Religious Services:   . Active Member of Clubs or Organizations:   . Attends Archivist Meetings:   Marland Kitchen Marital Status:   Intimate Partner Violence:   . Fear of Current or Ex-Partner:   . Emotionally Abused:   Marland Kitchen Physically Abused:   . Sexually Abused:       PHYSICAL EXAM  There were no vitals filed for this visit. There is no height or weight on file to calculate BMI.  Generalized: Well developed, in no acute distress   Neurological examination  Mentation: Alert oriented to time, place, history taking. Follows all commands speech and language fluent Cranial nerve II-XII: Pupils were equal round reactive  to light. Extraocular movements were full, visual field were full on confrontational test. Facial sensation and strength were normal. Uvula tongue midline. Head turning and shoulder shrug  were normal and symmetric. Motor: The motor testing reveals 5 over 5 strength of all 4 extremities. Good symmetric motor tone is noted throughout.  Sensory: Sensory testing is intact to soft touch on all 4 extremities. No evidence of extinction is noted.  Coordination: Cerebellar testing reveals good finger-nose-finger and heel-to-shin bilaterally.  Gait and station:  Gait is normal. Tandem gait is normal. Romberg is negative. No drift is seen.  Reflexes: Deep tendon reflexes are symmetric and normal bilaterally.   DIAGNOSTIC DATA (LABS, IMAGING, TESTING) - I reviewed patient records, labs, notes, testing and imaging myself where available.  Lab Results  Component Value Date   WBC 7.5 11/02/2019   HGB 12.8 11/02/2019   HCT 39.8 11/02/2019   MCV 89 11/02/2019   PLT 240 11/02/2019      Component Value Date/Time   NA 144 11/02/2019 1411   K 4.0 11/02/2019 1411   CL 101 11/02/2019 1411   CO2 29 11/02/2019 1411   GLUCOSE 101 (H) 11/02/2019 1411   GLUCOSE 114 (H) 04/26/2019 1045   BUN 24 11/02/2019 1411   CREATININE 0.99 11/02/2019 1411   CREATININE 1.29 (H) 12/10/2017 1349   CALCIUM 9.1 11/02/2019 1411   PROT 5.5 (L) 11/02/2019 1411   ALBUMIN 3.6 11/02/2019 1411   AST 22 02/24/2020 1444   ALT 8 11/02/2019 1411   ALKPHOS 74 11/02/2019 1411   BILITOT 0.5 11/02/2019 1411   GFRNONAA 54 (L) 11/02/2019 1411   GFRNONAA 39 (L) 12/10/2017 1349   GFRAA 62 11/02/2019 1411   GFRAA 46 (L) 12/10/2017 1349   Lab Results  Component Value Date   CHOL 182 02/24/2020   HDL 47 02/24/2020   LDLCALC 87 02/24/2020   LDLDIRECT 142.0 05/24/2015   TRIG 294 (H) 02/24/2020   CHOLHDL 3.9 02/24/2020   Lab Results  Component Value Date   HGBA1C 5.2 12/10/2017   Lab Results  Component Value Date   [id number] 10/24/2014   Lab Results  Component Value Date   TSH 0.561 11/02/2019      ASSESSMENT AND PLAN 84 y.o. year old female  has a past medical history of Anxiety (07/08/2017), Arthritis, Carotid artery disease ([place]), Carotid stenosis (10/19/2012), Chronic diastolic CHF (congestive heart failure) ([place]), CKD (chronic kidney disease), stage III, Congestive heart disease ([place]), COPD (chronic obstructive pulmonary disease) ([place]), COPD GOLD IV (12/30/2014), Cough (01/31/2016), Dementia with behavioral disturbance ([place]) (07/08/2017), Diverticulosis, Dyspnea,  Essential hypertension (12/02/2014), Fall at home (Sept. 2013), Family history of adverse reaction to anesthesia, Gastroesophageal reflux disease (07/08/2017), Hyperlipidemia, Hypertension, Hyponatremia (12/02/2014), Hypotension (02/01/2017), Incontinent of urine, Internal hemorrhoid, Intertrochanteric fracture ([place]) (04/20/2019), Memory disorder (10/24/2014), Nausea and vomiting (11/30/2014), Primary osteoarthritis involving multiple joints (07/08/2017), and Vitamin D deficiency (02/15/2015). here with ***   I spent 15 minutes with the patient. 50% of this time was spent   Butler Denmark, [place], DNP 03/21/2020, 5:33 AM [facility] [address], [place], [place] [address] [phone]

## 2020-03-23 ENCOUNTER — Telehealth: Payer: Self-pay | Admitting: *Deleted

## 2020-03-23 NOTE — Telephone Encounter (Signed)
Received CMN for Oxygen for patient from [facility] [phone] Fax: [phone]. Filled out and placed in [place] folder to review and sign. To be faxed back once completed.

## 2020-03-27 ENCOUNTER — Other Ambulatory Visit: Payer: Self-pay

## 2020-03-27 ENCOUNTER — Other Ambulatory Visit: Payer: Medicare Other

## 2020-03-29 ENCOUNTER — Telehealth: Payer: Self-pay | Admitting: *Deleted

## 2020-03-29 NOTE — Telephone Encounter (Signed)
Called patient's residence and left a voicemail to schedule a Palliative care home visit. A visit was initially scheduled for 03/27/20, however [name] called and cancelled d/t a schedule conflict. Left contact information for return call.

## 2020-03-30 ENCOUNTER — Other Ambulatory Visit: Payer: Medicare Other | Admitting: *Deleted

## 2020-03-30 DIAGNOSIS — Z515 Encounter for palliative care: Secondary | ICD-10-CM

## 2020-04-02 ENCOUNTER — Other Ambulatory Visit: Payer: Self-pay | Admitting: Nurse Practitioner

## 2020-04-02 ENCOUNTER — Other Ambulatory Visit: Payer: Medicare Other

## 2020-04-02 ENCOUNTER — Other Ambulatory Visit: Payer: Medicare Other | Admitting: *Deleted

## 2020-04-02 ENCOUNTER — Telehealth: Payer: Self-pay | Admitting: Neurology

## 2020-04-02 ENCOUNTER — Other Ambulatory Visit: Payer: Self-pay

## 2020-04-02 DIAGNOSIS — J440 Chronic obstructive pulmonary disease with acute lower respiratory infection: Secondary | ICD-10-CM

## 2020-04-02 DIAGNOSIS — Z515 Encounter for palliative care: Secondary | ICD-10-CM

## 2020-04-02 DIAGNOSIS — J209 Acute bronchitis, unspecified: Secondary | ICD-10-CM

## 2020-04-02 MED ORDER — QUETIAPINE FUMARATE 25 MG PO TABS: 25.0000 mg | ORAL_TABLET | Freq: Every day | ORAL | 3 refills | Status: DC

## 2020-04-02 NOTE — Progress Notes (Signed)
COMMUNITY PALLIATIVE CARE RN NOTE  PATIENT NAME: Virginia [name] DOB: 01-03-1936 MRN: [id number]  PRIMARY CARE PROVIDER: Lauree Chandler, NP  RESPONSIBLE PARTY:  Acct ID - Guarantor Home Phone Work Phone Relationship Acct Type  [id number] ESTA, [id number]  Self P/F     [address], [place], [place] [address]   Due to the COVID-19 crisis, this virtual check-in visit was done via telephone from my office and it was initiated and consent by this patient and or family.  PLAN OF CARE and INTERVENTION:  1. ADVANCE CARE PLANNING/GOALS OF CARE: Goal is for patient to remain in her home and avoid hospitalizations. She is a Full code. 2. PATIENT/CAREGIVER EDUCATION: Symptom management, safe transfers 3. DISEASE STATUS: Virtual check-in visit completed via telephone. Information being provided by patient's daughter, [name]. Patient continues to experience generalized pain, most prevalent with movement. She is being given Tylenol to help. She remains on continuous O2. She does have some congestion and wheezing at times and is being given nebulizer treatments and Mucinex to help. The biggest concern is patient's escalating agitation and combative behaviors. She is hitting, kicking at and scratching caregivers and family. She is often refusing care. A few months ago, patient's Neurologist gave family Risperdal to try to help with patient agitation. She was given this medication once, and patient's younger brother, [name], felt that it caused increased confusion so discontinued it. I recommended that this medication be tried again to help with her behaviors. She is spending more time in bed and sleeping later into the day. Her intake is variable, but she is a high aspiration risk, as she coughs with foods and fluids. Her medications are given crushed in applesauce. There are times that she refuses to take her medication. She is total care for all ADLs. She is becoming progressively weaker overall.  Continues to require 2 person assistance with transfers to her wheelchair or recliner. She has 2 caregivers daily from 9a to 9p. She is incontinent of both bowel and bladder and wears adult briefs. Will continue to monitor.  HISTORY OF PRESENT ILLNESS:  This is a 84 yo female with a diagnosis of Dementia and CHF, who resides at home. Palliative care continues to follow patient and visits monthly and PRN.   CODE STATUS: Full code  ADVANCED DIRECTIVES: Y MOST FORM: no PPS: 30%  (Duration of visit and documentation 45 minutes)   [name], RN BSN

## 2020-04-02 NOTE — Telephone Encounter (Signed)
I called dorothy, daughter, who is with [facility], palliative care .  Pt has combative behavior with personal care givers, when receiving her daily care  (could not tolerate risperidal made her more confused, scared of SE, xanax made her too groggy.  Seroquel was other option (low dose).  Is on sertraline. (no other changes per moneisha.  Please advise.

## 2020-04-02 NOTE — Telephone Encounter (Signed)
I called and relayed that seroquel order placed.  Brother in with SW for options of pt.  She will call back if questions.  Ok to [place] medication.

## 2020-04-02 NOTE — Telephone Encounter (Signed)
Daughter [name] called and LVM wanting to discuss pt's medications. VM kept cutting in and out and most of the message was not understood. Please advise.

## 2020-04-02 NOTE — Telephone Encounter (Signed)
She can try Seroquel 25 mg at bedtime for agitation. Stop the Risperdal, but I have in my notes, only took it once anyway. Rx sent.

## 2020-04-04 NOTE — Progress Notes (Signed)
COMMUNITY PALLIATIVE CARE SW NOTE  PATIENT NAME: Virginia [name] DOB: [birth year] MRN: [id number]  PRIMARY CARE PROVIDER: Lauree Chandler, NP  RESPONSIBLE PARTY:  Acct ID - Guarantor Home Phone Work Phone Relationship Acct Type  [id number] GEMMA, RUAN* [phone]  Self P/F     [address], [place], [place] [address]     PLAN OF CARE and INTERVENTIONS:             1. GOALS OF CARE/ ADVANCE CARE PLANNING: Patient is a FULL CODE. The goal is to keep and care for patient in her home.  2. SOCIAL/EMOTIONAL/SPIRITUAL ASSESSMENT/ INTERVENTIONS:  SW and RN-M. Howard completed face-to-face with patient at her home. Present during this visit is patient's daughter-[name], son-[name] and her brother [name]. Patient was present in bed, awake, alert and pleasant. She denied that she was having any pain or discomfort. Patient smiled and seemed to be receptive to SW presence. SW and RN talked with patient's brother and children regarding her status and changes they have observed in patient. The family report that patient is sleeping more, intermittent combative behavior and decreased intake. Patient is declining to get out of bed and has some coughing during the night. Patient medications are grind up and has sometimes whre Patient has caregivers for a 12 hour shift. SW observed discord between patient's children regarding finances and patient's care. Discord appears to start when daughter is in town.Overall, patient's care appears to be in tact and family find common ground around ensuring patient's care needs are being met. Supportive presence, active and supportive listening provided, mediation recommended for family. 3. PATIENT/CAREGIVER EDUCATION/ COPING: Patient is alert and oriented to self. She is able to express needs. Family has some discord complicates family dynamics and care situation for patient.  4. PERSONAL EMERGENCY PLAN: Patient's primary care physician can be accessed for support. 911 can be  activated for support. 5. COMMUNITY RESOURCES COORDINATION/ HEALTH CARE NAVIGATION:  SW reinforced access to palliative care support. 6. FINANCIAL/LEGAL CONCERNS/INTERVENTIONS:  No financial or legal issues.      SOCIAL HX:  Social History   Tobacco Use  . Smoking status: Former Smoker    Packs/day: 0.25    Years: 20.00    Pack years: 5.00    Types: Cigarettes    Quit date: 04/2019    Years since quitting: 1.0  . Smokeless tobacco: Never Used  . Tobacco comment: 4 or 5 cigarettes per day average  Substance Use Topics  . Alcohol use: Yes    Alcohol/week: 0.0 standard drinks    Comment: occasionally     CODE STATUS: FULL CODE ADVANCED DIRECTIVES: N MOST FORM COMPLETE:  No HOSPICE EDUCATION PROVIDED: No  PPS: Patient is bedbound and dependent for personal care needs.   Duration of visit and documentation: 120 minutes      Katheren Puller, LCSW

## 2020-04-05 NOTE — Progress Notes (Signed)
COMMUNITY PALLIATIVE CARE RN NOTE  PATIENT NAME: Virginia [name] DOB: 05-21-1936 MRN: [id number]  PRIMARY CARE PROVIDER: Lauree Chandler, NP  RESPONSIBLE PARTY:  Acct ID - Guarantor Home Phone Work Phone Relationship Acct Type  [id number] RAYLEY, GAO* [phone]  Self P/F     [address] [place], [place] [address]   Covid-19 Pre-screening Negative  PLAN OF CARE and INTERVENTION:  1. ADVANCE CARE PLANNING/GOALS OF CARE: Goal is for patient to remain in her home. She is a Full code. 2. PATIENT/CAREGIVER EDUCATION: Symptom management, safe transfers, s/s of infection  3. DISEASE STATUS: Joint visit made with Palliative Care SW, Monica Lonon. Met with patient's son/[name], daughter/[name], brother/David and hired caregiver [employer]. Family reports that patient seems to be declining overall. She is sleeping more and later in the day. She is less cognitive, more confused with worsening combative behaviors. Caregivers have been unable to get patient out of bed in almost 2 weeks d/t behaviors and refusing care. She was prescribed Risperidone in the past, however [name] felt that it caused increased confusion and caused patient to continually try to get out of the wheelchair unassisted. They placed a call in to her Neurologist to discuss a different medication to help manage her agitation. I was still present when the nurse called back and explained her condition . She spoke with the MD and Seroquel was called in to her pharmacy. Family is agreeable with trying this medication. Her intake remains variable. She does cough during meals so is an aspiration risk. Her medications are given crushed in chocolate pudding. [name] notices that she is awake, coughing at night and at times wheezing. They continue to elevate the head of her bed during these episodes, give cough medications and nebulizer treatments, which do help. She does experience some right shoulder pain at times when turned, repositioned in bed.  Tylenol is being given twice daily in the am and pm. No recent issues with right hand swelling. I did visit with patient briefly. She is lying in bed awake and able to answer simple questions and make needs known. Visits are kept brief to decrease her agitation. She does have some redness underneath her abdominal fold on her left side. Barrier cream is being applied. Educated on keeping area dry to help prevent further skin breakdown. Caregiver to contact me if area does not improve with current treatment. Will continue to monitor.  HISTORY OF PRESENT ILLNESS:  This is a 84 yo female with a diagnosis of dementia. Palliative care team continues to follow patient and visits monthly and PRN. She continues with 2 hired caregivers from [place] to 9p daily.   CODE STATUS: Full code ADVANCED DIRECTIVES: Y MOST FORM: no PPS: 30%    (Duration of visit and documentation 135 minutes)   [name], RN BSN

## 2020-04-26 ENCOUNTER — Telehealth: Payer: Medicare Other | Admitting: Neurology

## 2020-05-07 ENCOUNTER — Telehealth (INDEPENDENT_AMBULATORY_CARE_PROVIDER_SITE_OTHER): Payer: Medicare Other | Admitting: Neurology

## 2020-05-07 ENCOUNTER — Encounter: Payer: Self-pay | Admitting: Neurology

## 2020-05-07 DIAGNOSIS — F0391 Unspecified dementia with behavioral disturbance: Secondary | ICD-10-CM | POA: Diagnosis not present

## 2020-05-07 DIAGNOSIS — F419 Anxiety disorder, unspecified: Secondary | ICD-10-CM | POA: Diagnosis not present

## 2020-05-07 MED ORDER — DONEPEZIL HCL 10 MG PO TABS: 10.0000 mg | ORAL_TABLET | Freq: Every day | ORAL | 5 refills | Status: DC

## 2020-05-07 MED ORDER — QUETIAPINE FUMARATE 25 MG PO TABS: 25.0000 mg | ORAL_TABLET | Freq: Every day | ORAL | 5 refills | Status: DC

## 2020-05-07 MED ORDER — SERTRALINE HCL 50 MG PO TABS: 100.0000 mg | ORAL_TABLET | Freq: Every day | ORAL | 5 refills | Status: DC
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
## 2020-05-07 NOTE — Progress Notes (Signed)
I have read the note, and I agree with the clinical assessment and plan.  Minnette Merida K Lamis Behrmann   

## 2020-05-12 ENCOUNTER — Other Ambulatory Visit: Payer: Self-pay | Admitting: Nurse Practitioner

## 2020-05-15 ENCOUNTER — Other Ambulatory Visit: Payer: Self-pay

## 2020-05-15 DIAGNOSIS — J209 Acute bronchitis, unspecified: Secondary | ICD-10-CM

## 2020-05-15 DIAGNOSIS — J440 Chronic obstructive pulmonary disease with acute lower respiratory infection: Secondary | ICD-10-CM

## 2020-05-15 MED ORDER — IPRATROPIUM-ALBUTEROL 0.5-2.5 (3) MG/3ML IN SOLN: RESPIRATORY_TRACT | 0 refills | Status: DC

## 2020-05-15 NOTE — Telephone Encounter (Signed)
Patients daughter called stating mail order rx for Duo-neb is on a delay and she needs a short term rx sent to local pharmacy on file to avoid patient running out of medication.   RX sent to [facility] as requested

## 2020-05-22 ENCOUNTER — Telehealth: Payer: Self-pay | Admitting: *Deleted

## 2020-05-22 NOTE — Telephone Encounter (Signed)
Called and left a voicemail to schedule a Palliative care home visit. Contact information left for return call.  

## 2020-05-23 ENCOUNTER — Telehealth: Payer: Self-pay | Admitting: *Deleted

## 2020-05-23 NOTE — Telephone Encounter (Signed)
Received a return call from patient's brother, [name], stating that it is ok for me to visit patient this Friday, 05/25/20@1130a .

## 2020-05-25 ENCOUNTER — Other Ambulatory Visit: Payer: Self-pay

## 2020-05-25 ENCOUNTER — Other Ambulatory Visit: Payer: Medicare Other | Admitting: *Deleted

## 2020-05-25 VITALS — HR 87 | Temp 98.2°F | Resp 19

## 2020-05-25 DIAGNOSIS — Z515 Encounter for palliative care: Secondary | ICD-10-CM

## 2020-05-30 NOTE — Progress Notes (Signed)
COMMUNITY PALLIATIVE CARE RN NOTE  PATIENT NAME: Virginia [name] DOB: 1936-07-27 MRN: [id number]  PRIMARY CARE PROVIDER: Lauree Chandler, NP  RESPONSIBLE PARTY:  Acct ID - Guarantor Home Phone Work Phone Relationship Acct Type  [id number] BRADY, PLANT* [phone]  Self P/F     [address], [place],  [address]   Covid-19 Pre-screening Negative  PLAN OF CARE and INTERVENTION:  1. ADVANCE CARE PLANNING/GOALS OF CARE: Goal is for patient to remain in her home. 2. PATIENT/CAREGIVER EDUCATION: Symptom management, safe transfers 3. DISEASE STATUS: Met with patient, her brother, [name], son, [name] and hired caregivers in patient's home. Family reports that patient remains in the bed most of the time as she is unable to bear any weight most of the time. They had a recent care plan meeting with [name], who provides their hired sitters. They advised that their staff will be unable to transfer patient if she is unable to bear weight for safety reasons. She is becomes too combative with the use of a Hoyer lift. The last time patient was up was about 1 1/2 weeks ago. She has 2 caregivers at all times. She does have behaviors, but they have been milder since she has been taking Seroquel 25 mg at bedtime. She has a persistent cough. Wheezing noted bilaterally upon exhalation heard without the use of a stethoscope. Also during visit, she took a sip of water and had a coughing spell. She is at high risk for aspiration. I recommended that they go ahead and give her a breathing treatment, but the treatments have not been very effective in helping her wheezing. They are also giving Mucinex as needed. [name] feels that she could benefit from a Prednisone taper, as this cough has been going on for over a month. She is unable to expectorate phlegm. Advised I will contact our Palliative care NP regarding this issue. She does not appear to be in any distress, but her cough does not sound good. She has a skin tear noted  to her left forearm that occured 3 days ago. [facility] nurse came out and dressed it with Polymem. I removed dressing, which patient did not like and started yelling out and jerking her arm. Caregivers assisted me in keeping her arm still. Family states that the skin tear does look better. No current drainage noted, but old dressing did have a moderate amount of blood stained on it. Cleansed area with warm soap and water and covered with another Polymem dressing. Advised that they could leave this in place for 5-7 days then re-assess. Otherwise her skin is good. I contacted our NP, Violeta Gelinas, who gave the order for a Prednisone taper after I gave her my assessment. I called this into patient's pharmacy and ordered this medication and contacted the family and left a voicemail with instructions. Will continue to monitor.   HISTORY OF PRESENT ILLNESS:This is a 84 yo female with a diagnosis of dementia. Palliative care team continues to follow patient and visits monthly and PRN. She continues with 2 hired caregivers from [place] to 9p daily.      CODE STATUS: Full code  ADVANCED DIRECTIVES: Y MOST FORM: no PPS: 30%   PHYSICAL EXAM:   VITALS: Today's Vitals   05/25/20 1146  Pulse: 87  Resp: 19  Temp: 98.2 F (36.8 C)  TempSrc: Temporal  SpO2: 95%  PainSc: 0-No pain    LUNGS: expiratory wheezes bilaterally CARDIAC: Cor RRR EXTREMITIES: No edema SKIN: Skin tear to left forearm  NEURO: Alert and oriented to person/place, intermittent confusion, generalized weakness, bedbound   (Duration of visit and documentation 75 minutes)   [name], RN BSN

## 2020-05-31 ENCOUNTER — Other Ambulatory Visit: Payer: Self-pay

## 2020-05-31 ENCOUNTER — Telehealth: Payer: Self-pay

## 2020-05-31 ENCOUNTER — Encounter: Payer: Self-pay | Admitting: Nurse Practitioner

## 2020-05-31 ENCOUNTER — Ambulatory Visit (INDEPENDENT_AMBULATORY_CARE_PROVIDER_SITE_OTHER): Payer: Medicare Other | Admitting: Nurse Practitioner

## 2020-05-31 DIAGNOSIS — Z0000 Encounter for general adult medical examination without abnormal findings: Secondary | ICD-10-CM

## 2020-05-31 NOTE — Progress Notes (Deleted)
Subjective:   Virginia [name] is a 84 y.o. female who presents for Medicare Annual (Subsequent) preventive examination.  Review of Systems           Objective:    There were no vitals filed for this visit. There is no height or weight on file to calculate BMI.  Advanced Directives 06/28/2019 05/30/2019 05/04/2019 04/28/2019 04/20/2019 12/14/2018 09/01/2018  Does Patient Have a Medical Advance Directive? Yes Yes Yes No No Yes Yes  Type of Paramedic of [place];Living will Healthcare Power of [place];Living will  Does patient want to make changes to medical advance directive? Yes (MAU/Ambulatory/Procedural Areas - Information given) - - - - No - Patient declined -  Copy of [place] in Chart? No - copy requested No - copy requested No - copy requested - - No - copy requested No - copy requested  Would patient like information on creating a medical advance directive? - - - No - Patient declined No - Patient declined - -    Current Medications (verified) Outpatient Encounter Medications as of 05/31/2020  Medication Sig  . acetaminophen (TYLENOL) 500 MG tablet Take 500 mg by mouth 3 (three) times daily as needed.  Marland Kitchen aspirin EC 81 MG tablet Take 81 mg by mouth daily.  . Carbinoxamine Maleate 4 MG TABS Take 1 tablet (4 mg total) by mouth every 8 (eight) hours as needed.  . Cholecalciferol (D3 SUPER STRENGTH) 2000 units CAPS Take 1 capsule (2,000 Units total) by mouth daily.  Marland Kitchen donepezil (ARICEPT) 10 MG tablet Take 1 tablet (10 mg total) by mouth daily.  . furosemide (LASIX) 20 MG tablet TAKE 1 TABLET BY MOUTH ONCE DAILY  . ipratropium-albuterol (DUONEB) 0.5-2.5 (3) MG/3ML SOLN USE 1 VIAL IN NEBULIZER EVERY 6 HOURS - and as needed  . memantine (NAMENDA) 10 MG tablet TAKE 1 TABLET BY MOUTH TWICE DAILY  . ondansetron (ZOFRAN-ODT) 4 MG disintegrating tablet Take 4 mg  by mouth as needed for nausea or vomiting.  Marland Kitchen OVER THE COUNTER MEDICATION Mucinex guaifenesin liquid 200 mg 10 ml BID  . pantoprazole (PROTONIX) 40 MG tablet TAKE 1 TABLET(40 MG) BY MOUTH TWICE DAILY  . propranolol (INDERAL) 20 MG tablet TAKE 1 TABLET(20 MG) BY MOUTH TWICE DAILY  . QUEtiapine (SEROQUEL) 25 MG tablet Take 1 tablet (25 mg total) by mouth at bedtime.  . rosuvastatin (CRESTOR) 20 MG tablet Take 1 tablet (20 mg total) by mouth daily.  . sertraline (ZOLOFT) 50 MG tablet Take 2 tablets (100 mg total) by mouth daily.  . [DISCONTINUED] AMBULATORY NON FORMULARY MEDICATION 1.Home health Nurse to cleanse right shin area skin tear with saline,pat dry,apply triple antibiotic ointment to wound bed and cover with foam dressing for protection and absorption.change dressing every 3 days and as needed if soiled.Notify provider for any yellow drainage or if wound worsen.  2. Apply foam dressing to left lower leg bruise area for extra protection.  . [DISCONTINUED] UNABLE TO FIND Med Name: right hand compression glove wear daily for edema.   No facility-administered encounter medications on file as of 05/31/2020.    Allergies (verified) Lidocaine and Other   History: Past Medical History:  Diagnosis Date  . Anxiety 07/08/2017  . Arthritis   . Carotid artery disease ([place])    a. s/p L CEA. b. followed by [facility].  . Carotid stenosis 10/19/2012  . Chronic diastolic  CHF (congestive heart failure) ([place])   . CKD (chronic kidney disease), stage III   . Congestive heart disease ([place])   . COPD (chronic obstructive pulmonary disease) ([place])   . COPD GOLD IV 12/30/2014   PFTs 12/15/14 FEV1  0.57 (29%) ratio 54 p 17% resp to saba    . Cough 01/31/2016  . Dementia with behavioral disturbance ([place]) 07/08/2017  . Diverticulosis   . Dyspnea   . Essential hypertension 12/02/2014  . Fall at home Sept. 2013  . Family history of adverse reaction to anesthesia    SON IS SLOW TO WAKE   . Gastroesophageal reflux disease  07/08/2017  . Hyperlipidemia   . Hypertension   . Hyponatremia 12/02/2014  . Hypotension 02/01/2017  . Incontinent of urine   . Internal hemorrhoid   . Intertrochanteric fracture ([place]) 04/20/2019  . Memory disorder 10/24/2014  . Nausea and vomiting 11/30/2014  . Primary osteoarthritis involving multiple joints 07/08/2017  . Vitamin D deficiency 02/15/2015   Past Surgical History:  Procedure Laterality Date  . APPENDECTOMY    . BREAST REDUCTION SURGERY    . CAROTID ENDARTERECTOMY     left CEA  . CATARACT EXTRACTION Bilateral   . COLONOSCOPY  2015  . EYE SURGERY     cataracts removed, bilaterally   . INTRAMEDULLARY (IM) NAIL INTERTROCHANTERIC Right 04/21/2019   Procedure: INTRAMEDULLARY (IM) NAIL INTERTROCHANTRIC;  Surgeon: Rod Can, MD;  Location: [facility];  Service: Orthopedics;  Laterality: Right;  . skin cancer removal    . TONSILLECTOMY     Family History  Problem Relation Age of Onset  . Heart disease Father        Before age 44  . Hypertension Father   . Heart attack Father   . Cancer Mother 61       Brain  . Dementia Brother   . Cancer Maternal Aunt 61       breast cancer  . Diabetes Maternal Aunt   . High blood pressure Brother   . Hypothyroidism Daughter   . High blood pressure Son   . High Cholesterol Son   . Diabetes Son   . COPD Son   . Obesity Son   . Heart failure Maternal Grandmother   . Diabetes Maternal Grandmother   . Stroke Neg Hx    Social History   Socioeconomic History  . Marital status: Divorced    Spouse name: Not on file  . Number of children: 2  . Years of education: 7  . Highest education level: Not on file  Occupational History  . Occupation: retired  Tobacco Use  . Smoking status: Former Smoker    Packs/day: 0.25    Years: 20.00    Pack years: 5.00    Types: Cigarettes    Quit date: 04/2019    Years since quitting: 1.1  . Smokeless tobacco: Never Used  . Tobacco comment: 4 or 5 cigarettes per day average  Vaping Use  .  Vaping Use: Never used  Substance and Sexual Activity  . Alcohol use: Yes    Alcohol/week: 0.0 standard drinks    Comment: occasionally   . Drug use: No  . Sexual activity: Not Currently    Birth control/protection: Post-menopausal  Other Topics Concern  . Not on file  Social History Narrative   04/05/18 lives with son and brother, [name]   Diet:       Do you drink/eat things with caffeine: yes      Marital Status:  Divorced   What Year Married:1959      Do you live in a house, apartment, Assisted Living, [place], trailer? House      Is it one or more stories? 2 stories      How many persons live in your home? Just Patient      Do you have any pets in your home? 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 Very Little   Type and how often? Walk      Do you have a living will? None   DNR?   Discuss one? No      Do you have signed POA/HPOA forms?      Patient drinks 1 cup of caffeine daily.   Patient is right handed.   Social Determinants of Health   Financial Resource Strain:   . Difficulty of Paying Living Expenses:   Food Insecurity:   . Worried About Charity fundraiser in the Last Year:   . Arboriculturist in the Last Year:   Transportation Needs:   . Film/video editor (Medical):   Marland Kitchen Lack of Transportation (Non-Medical):   Physical Activity:   . Days of Exercise per Week:   . Minutes of Exercise per Session:   Stress:   . Feeling of Stress :   Social Connections:   . Frequency of Communication with Friends and Family:   . Frequency of Social Gatherings with Friends and Family:   . Attends Religious Services:   . Active Member of Clubs or Organizations:   . Attends Archivist Meetings:   Marland Kitchen Marital Status:     Tobacco Counseling Counseling given: Not Answered Comment: 4 or 5 cigarettes per day average   Clinical Intake:                 Diabetic?no         Activities of Daily Living No  flowsheet data found.  Patient Care Team: Lauree Chandler, NP as PCP - General (Nurse Practitioner) Fay Records, MD as PCP - Cardiology (Cardiology) Tanda Rockers, MD as Consulting Physician (Pulmonary Disease) Conan Bowens, RN as Registered Nurse [facility] and Palliative Medicine) Hilarie Fredrickson, Lajuan Lines, MD as Consulting Physician (Gastroenterology)  Indicate any recent Medical Services you may have received from other than [facility] providers in the past year (date may be approximate).     Assessment:   This is a routine wellness examination for [place].  Hearing/Vision screen  Hearing Screening   125Hz  250Hz  500Hz  1000Hz  2000Hz  3000Hz  4000Hz  6000Hz  8000Hz   Right ear:           Left ear:           Comments: Patient has no hearing issues.  Vision Screening Comments: Vision is not good. Unable to get patient to eye appointments  Dietary issues and exercise activities discussed:    Goals    . Increase water intake     Starting 03/30/2017 I will increase water intake 1-2 cups a day based on what cardiologist stays.      Depression Screen PHQ 2/9 Scores 05/31/2020 05/30/2019 05/20/2019 12/14/2018 07/01/2018 05/24/2018 03/30/2017  PHQ - 2 Score 0 0 0 0 0 0 0    Fall Risk Fall Risk  11/07/2019 10/21/2019 08/03/2019 06/28/2019 05/30/2019  Falls in the past year? 1 1 1 1 1   Number falls in past yr: 1 1 0 1 1  Injury with Fall? 1 1 1 1 1   Comment - - - - -  Risk for fall due to : History of fall(s) - - History of fall(s);Impaired balance/gait;Impaired mobility History of fall(s);Impaired balance/gait    Any stairs in or around the home? {YES/NO:21197} If so, are there any without handrails? {YES/NO:21197} Home free of loose throw rugs in walkways, pet beds, electrical cords, etc? {YES/NO:21197} Adequate lighting in your home to reduce risk of falls? {YES/NO:21197}  ASSISTIVE DEVICES UTILIZED TO PREVENT FALLS:  Life alert? {YES/NO:21197} Use of a cane, walker or w/c? {YES/NO:21197} Grab  bars in the bathroom? {YES/NO:21197} Shower chair or bench in shower? {YES/NO:21197} Elevated toilet seat or a handicapped toilet? {YES/NO:21197}  TIMED UP AND GO:  Was the test performed? {YES/NO:21197}.  Length of time to ambulate 10 feet: *** sec.   {Appearance of Gait:2101803}  Cognitive Function: MMSE - Mini Mental State Exam 04/05/2018 11/19/2017 05/13/2017 03/30/2017 11/12/2016  Orientation to time 2 4 0 3 2  Orientation to Place 3 3 4 3 4   Registration 3 3 3 3 3   Attention/ Calculation 0 1 5 4 2   Recall 0 0 0 0 0  Language- name 2 objects 2 2 2 2 2   Language- repeat 0 0 1 1 1   Language- follow 3 step command 3 3 3 1 3   Language- read & follow direction 1 1 1 1 1   Write a sentence 1 1 1 1 1   Copy design 0 0 1 1 1   Total score 15 18 21 20 20      6CIT Screen 05/30/2019  What Year? 4 points  What month? 3 points  What time? 3 points  Count back from 20 4 points    Immunizations Immunization History  Administered Date(s) Administered  . Fluad Quad(high Dose 65+) 08/03/2019  . Influenza Split 08/01/2014  . Influenza, High Dose Seasonal PF 09/08/2017, 09/01/2018  . Influenza,inj,Quad PF,6+ Mos 08/16/2015, 08/15/2016  . Pneumococcal Conjugate-13 03/30/2017  . Pneumococcal-Unspecified 08/01/2014  . Tdap 01/16/2018    {TDAP status:2101805} {Flu Vaccine status:2101806} {Pneumococcal vaccine status:2101807} {Covid-19 vaccine status:2101808}  Qualifies for Shingles Vaccine? {YES/NO:21197}  Zostavax completed {YES/NO:21197}  {Shingrix Completed?:2101804}  Screening Tests Health Maintenance  Topic Date Due  . COVID-19 Vaccine (1) Never done  . INFLUENZA VACCINE  07/01/2020  . TETANUS/TDAP  01/17/2028  . DEXA SCAN  Completed  . PNA vac Low Risk Adult  Completed    Health Maintenance  Health Maintenance Due  Topic Date Due  . COVID-19 Vaccine (1) Never done    {Colorectal cancer screening:2101809} {Mammogram status:21018020} {Bone Density  status:21018021}  Lung Cancer Screening: (Low Dose CT Chest recommended if Age 54-80 years, 30 pack-year currently smoking OR have quit w/in 15years.) {DOES NOT does:27190::"does not"} qualify.   Lung Cancer Screening Referral: ***  Additional Screening:  Hepatitis C Screening: {DOES NOT does:27190::"does not"} qualify; Completed ***  Vision Screening: Recommended annual ophthalmology exams for early detection of glaucoma and other disorders of the eye. Is the patient up to date with their annual eye exam?  {YES/NO:21197} Who is the provider or what is the name of the office in which the patient attends annual eye exams? *** If pt is not established with a provider, would they like to be referred to a provider to establish care? {YES/NO:21197}.   Dental Screening: Recommended annual dental exams for proper oral hygiene  Community Resource Referral / Chronic Care Management: CRR required this visit?  {YES/NO:21197}  CCM required this visit?  {  YES/NO:21197}     Plan:     I have personally reviewed and noted the following in the patient's chart:   . Medical and social history . Use of alcohol, tobacco or illicit drugs  . Current medications and supplements . Functional ability and status . Nutritional status . Physical activity . Advanced directives . List of other physicians . Hospitalizations, surgeries, and ER visits in previous 12 months . Vitals . Screenings to include cognitive, depression, and falls . Referrals and appointments  In addition, I have reviewed and discussed with patient certain preventive protocols, quality metrics, and best practice recommendations. A written personalized care plan for preventive services as well as general preventive health recommendations were provided to patient.     Lauree Chandler, NP   05/31/2020   Nurse Notes: ***      Subjective:   Virginia [name] is a 84 y.o. female who presents for Medicare Annual (Subsequent)  preventive examination.  Review of Systems    ***       Objective:    There were no vitals filed for this visit. There is no height or weight on file to calculate BMI.  Advanced Directives 06/28/2019 05/30/2019 05/04/2019 04/28/2019 04/20/2019 12/14/2018 09/01/2018  Does Patient Have a Medical Advance Directive? Yes Yes Yes No No Yes Yes  Type of Paramedic of [place];Living will Healthcare Power of [place];Living will  Does patient want to make changes to medical advance directive? Yes (MAU/Ambulatory/Procedural Areas - Information given) - - - - No - Patient declined -  Copy of [place] in Chart? No - copy requested No - copy requested No - copy requested - - No - copy requested No - copy requested  Would patient like information on creating a medical advance directive? - - - No - Patient declined No - Patient declined - -    Current Medications (verified) Outpatient Encounter Medications as of 05/31/2020  Medication Sig  . acetaminophen (TYLENOL) 500 MG tablet Take 500 mg by mouth 3 (three) times daily as needed.  Marland Kitchen aspirin EC 81 MG tablet Take 81 mg by mouth daily.  . Carbinoxamine Maleate 4 MG TABS Take 1 tablet (4 mg total) by mouth every 8 (eight) hours as needed.  . Cholecalciferol (D3 SUPER STRENGTH) 2000 units CAPS Take 1 capsule (2,000 Units total) by mouth daily.  Marland Kitchen donepezil (ARICEPT) 10 MG tablet Take 1 tablet (10 mg total) by mouth daily.  . furosemide (LASIX) 20 MG tablet TAKE 1 TABLET BY MOUTH ONCE DAILY  . ipratropium-albuterol (DUONEB) 0.5-2.5 (3) MG/3ML SOLN USE 1 VIAL IN NEBULIZER EVERY 6 HOURS - and as needed  . memantine (NAMENDA) 10 MG tablet TAKE 1 TABLET BY MOUTH TWICE DAILY  . ondansetron (ZOFRAN-ODT) 4 MG disintegrating tablet Take 4 mg by mouth as needed for nausea or vomiting.  Marland Kitchen OVER THE COUNTER MEDICATION Mucinex guaifenesin  liquid 200 mg 10 ml BID  . pantoprazole (PROTONIX) 40 MG tablet TAKE 1 TABLET(40 MG) BY MOUTH TWICE DAILY  . propranolol (INDERAL) 20 MG tablet TAKE 1 TABLET(20 MG) BY MOUTH TWICE DAILY  . QUEtiapine (SEROQUEL) 25 MG tablet Take 1 tablet (25 mg total) by mouth at bedtime.  . rosuvastatin (CRESTOR) 20 MG tablet Take 1 tablet (20 mg total) by mouth daily.  . sertraline (ZOLOFT) 50 MG tablet Take 2 tablets (100 mg total) by mouth daily.  . [  DISCONTINUED] AMBULATORY NON FORMULARY MEDICATION 1.Home health Nurse to cleanse right shin area skin tear with saline,pat dry,apply triple antibiotic ointment to wound bed and cover with foam dressing for protection and absorption.change dressing every 3 days and as needed if soiled.Notify provider for any yellow drainage or if wound worsen.  2. Apply foam dressing to left lower leg bruise area for extra protection.  . [DISCONTINUED] UNABLE TO FIND Med Name: right hand compression glove wear daily for edema.   No facility-administered encounter medications on file as of 05/31/2020.    Allergies (verified) Lidocaine and Other   History: Past Medical History:  Diagnosis Date  . Anxiety 07/08/2017  . Arthritis   . Carotid artery disease ([place])    a. s/p L CEA. b. followed by [facility].  . Carotid stenosis 10/19/2012  . Chronic diastolic CHF (congestive heart failure) ([place])   . CKD (chronic kidney disease), stage III   . Congestive heart disease ([place])   . COPD (chronic obstructive pulmonary disease) ([place])   . COPD GOLD IV 12/30/2014   PFTs 12/15/14 FEV1  0.57 (29%) ratio 54 p 17% resp to saba    . Cough 01/31/2016  . Dementia with behavioral disturbance ([place]) 07/08/2017  . Diverticulosis   . Dyspnea   . Essential hypertension 12/02/2014  . Fall at home Sept. 2013  . Family history of adverse reaction to anesthesia    SON IS SLOW TO WAKE   . Gastroesophageal reflux disease 07/08/2017  . Hyperlipidemia   . Hypertension   . Hyponatremia 12/02/2014  . Hypotension  02/01/2017  . Incontinent of urine   . Internal hemorrhoid   . Intertrochanteric fracture ([place]) 04/20/2019  . Memory disorder 10/24/2014  . Nausea and vomiting 11/30/2014  . Primary osteoarthritis involving multiple joints 07/08/2017  . Vitamin D deficiency 02/15/2015   Past Surgical History:  Procedure Laterality Date  . APPENDECTOMY    . BREAST REDUCTION SURGERY    . CAROTID ENDARTERECTOMY     left CEA  . CATARACT EXTRACTION Bilateral   . COLONOSCOPY  2015  . EYE SURGERY     cataracts removed, bilaterally   . INTRAMEDULLARY (IM) NAIL INTERTROCHANTERIC Right 04/21/2019   Procedure: INTRAMEDULLARY (IM) NAIL INTERTROCHANTRIC;  Surgeon: Rod Can, MD;  Location: [facility];  Service: Orthopedics;  Laterality: Right;  . skin cancer removal    . TONSILLECTOMY     Family History  Problem Relation Age of Onset  . Heart disease Father        Before age 75  . Hypertension Father   . Heart attack Father   . Cancer Mother 51       Brain  . Dementia Brother   . Cancer Maternal Aunt 61       breast cancer  . Diabetes Maternal Aunt   . High blood pressure Brother   . Hypothyroidism Daughter   . High blood pressure Son   . High Cholesterol Son   . Diabetes Son   . COPD Son   . Obesity Son   . Heart failure Maternal Grandmother   . Diabetes Maternal Grandmother   . Stroke Neg Hx    Social History   Socioeconomic History  . Marital status: Divorced    Spouse name: Not on file  . Number of children: 2  . Years of education: 7  . Highest education level: Not on file  Occupational History  . Occupation: retired  Tobacco Use  . Smoking status: Former Smoker  Packs/day: 0.25    Years: 20.00    Pack years: 5.00    Types: Cigarettes    Quit date: 04/2019    Years since quitting: 1.1  . Smokeless tobacco: Never Used  . Tobacco comment: 4 or 5 cigarettes per day average  Vaping Use  . Vaping Use: Never used  Substance and Sexual Activity  . Alcohol use: Yes    Alcohol/week:  0.0 standard drinks    Comment: occasionally   . Drug use: No  . Sexual activity: Not Currently    Birth control/protection: Post-menopausal  Other Topics Concern  . Not on file  Social History Narrative   04/05/18 lives with son and brother, [name]   Diet:       Do you drink/eat things with caffeine: yes      Marital Status: Divorced   What Year Married:1959      Do you live in a house, apartment, Assisted Living, Condo, trailer? House      Is it one or more stories? 2 stories      How many persons live in your home? Just Patient      Do you have any pets in your home? 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 Very Little   Type and how often? Walk      Do you have a living will? None   DNR?   Discuss one? No      Do you have signed POA/HPOA forms?      Patient drinks 1 cup of caffeine daily.   Patient is right handed.   Social Determinants of Health   Financial Resource Strain:   . Difficulty of Paying Living Expenses:   Food Insecurity:   . Worried About Charity fundraiser in the Last Year:   . Arboriculturist in the Last Year:   Transportation Needs:   . Film/video editor (Medical):   Marland Kitchen Lack of Transportation (Non-Medical):   Physical Activity:   . Days of Exercise per Week:   . Minutes of Exercise per Session:   Stress:   . Feeling of Stress :   Social Connections:   . Frequency of Communication with Friends and Family:   . Frequency of Social Gatherings with Friends and Family:   . Attends Religious Services:   . Active Member of Clubs or Organizations:   . Attends Archivist Meetings:   Marland Kitchen Marital Status:     Tobacco Counseling Counseling given: Not Answered Comment: 4 or 5 cigarettes per day average   Clinical Intake:                 Diabetic?***         Activities of Daily Living No flowsheet data found.  Patient Care Team: Lauree Chandler, NP as PCP - General (Nurse  Practitioner) Fay Records, MD as PCP - Cardiology (Cardiology) Tanda Rockers, MD as Consulting Physician (Pulmonary Disease) Conan Bowens, RN as Registered Nurse [facility] and Palliative Medicine) Hilarie Fredrickson, Lajuan Lines, MD as Consulting Physician (Gastroenterology)  Indicate any recent Medical Services you may have received from other than [facility] providers in the past year (date may be approximate).     Assessment:   This is a routine wellness examination for [place].  Hearing/Vision screen  Hearing Screening   125Hz  250Hz  500Hz  1000Hz  2000Hz  3000Hz  4000Hz  6000Hz  8000Hz   Right ear:  Left ear:           Comments: Patient has no hearing issues.  Vision Screening Comments: Vision is not good. Unable to get patient to eye appointments  Dietary issues and exercise activities discussed:    Goals    . Increase water intake     Starting 03/30/2017 I will increase water intake 1-2 cups a day based on what cardiologist stays.      Depression Screen PHQ 2/9 Scores 05/31/2020 05/30/2019 05/20/2019 12/14/2018 07/01/2018 05/24/2018 03/30/2017  PHQ - 2 Score 0 0 0 0 0 0 0    Fall Risk Fall Risk  11/07/2019 10/21/2019 08/03/2019 06/28/2019 05/30/2019  Falls in the past year? 1 1 1 1 1   Number falls in past yr: 1 1 0 1 1  Injury with Fall? 1 1 1 1 1   Comment - - - - -  Risk for fall due to : History of fall(s) - - History of fall(s);Impaired balance/gait;Impaired mobility History of fall(s);Impaired balance/gait    Any stairs in or around the home? {YES/NO:21197} If so, are there any without handrails? {YES/NO:21197} Home free of loose throw rugs in walkways, pet beds, electrical cords, etc? {YES/NO:21197} Adequate lighting in your home to reduce risk of falls? {YES/NO:21197}  ASSISTIVE DEVICES UTILIZED TO PREVENT FALLS:  Life alert? {YES/NO:21197} Use of a cane, walker or w/c? {YES/NO:21197} Grab bars in the bathroom? {YES/NO:21197} Shower chair or bench in shower?  {YES/NO:21197} Elevated toilet seat or a handicapped toilet? {YES/NO:21197}  TIMED UP AND GO:  Was the test performed? {YES/NO:21197}.  Length of time to ambulate 10 feet: *** sec.   {Appearance of Gait:2101803}  Cognitive Function: MMSE - Mini Mental State Exam 04/05/2018 11/19/2017 05/13/2017 03/30/2017 11/12/2016  Orientation to time 2 4 0 3 2  Orientation to Place 3 3 4 3 4   Registration 3 3 3 3 3   Attention/ Calculation 0 1 5 4 2   Recall 0 0 0 0 0  Language- name 2 objects 2 2 2 2 2   Language- repeat 0 0 1 1 1   Language- follow 3 step command 3 3 3 1 3   Language- read & follow direction 1 1 1 1 1   Write a sentence 1 1 1 1 1   Copy design 0 0 1 1 1   Total score 15 18 21 20 20      6CIT Screen 05/30/2019  What Year? 4 points  What month? 3 points  What time? 3 points  Count back from 20 4 points    Immunizations Immunization History  Administered Date(s) Administered  . Fluad Quad(high Dose 65+) 08/03/2019  . Influenza Split 08/01/2014  . Influenza, High Dose Seasonal PF 09/08/2017, 09/01/2018  . Influenza,inj,Quad PF,6+ Mos 08/16/2015, 08/15/2016  . Pneumococcal Conjugate-13 03/30/2017  . Pneumococcal-Unspecified 08/01/2014  . Tdap 01/16/2018    {TDAP status:2101805} {Flu Vaccine status:2101806} {Pneumococcal vaccine status:2101807} {Covid-19 vaccine status:2101808}  Qualifies for Shingles Vaccine? {YES/NO:21197}  Zostavax completed {YES/NO:21197}  {Shingrix Completed?:2101804}  Screening Tests Health Maintenance  Topic Date Due  . COVID-19 Vaccine (1) Never done  . INFLUENZA VACCINE  07/01/2020  . TETANUS/TDAP  01/17/2028  . DEXA SCAN  Completed  . PNA vac Low Risk Adult  Completed    Health Maintenance  Health Maintenance Due  Topic Date Due  . COVID-19 Vaccine (1) Never done    {Colorectal cancer screening:2101809} {Mammogram status:21018020} {Bone Density status:21018021}  Lung Cancer Screening: (Low Dose CT Chest recommended if Age 69-80  years, 30 pack-year currently smoking  OR have quit w/in 15years.) {DOES NOT does:27190::"does not"} qualify.   Lung Cancer Screening Referral: ***  Additional Screening:  Hepatitis C Screening: {DOES NOT does:27190::"does not"} qualify; Completed ***  Vision Screening: Recommended annual ophthalmology exams for early detection of glaucoma and other disorders of the eye. Is the patient up to date with their annual eye exam?  {YES/NO:21197} Who is the provider or what is the name of the office in which the patient attends annual eye exams? *** If pt is not established with a provider, would they like to be referred to a provider to establish care? {YES/NO:21197}.   Dental Screening: Recommended annual dental exams for proper oral hygiene  Community Resource Referral / Chronic Care Management: CRR required this visit?  {YES/NO:21197}  CCM required this visit?  {YES/NO:21197}     Plan:     I have personally reviewed and noted the following in the patient's chart:   . Medical and social history . Use of alcohol, tobacco or illicit drugs  . Current medications and supplements . Functional ability and status . Nutritional status . Physical activity . Advanced directives . List of other physicians . Hospitalizations, surgeries, and ER visits in previous 12 months . Vitals . Screenings to include cognitive, depression, and falls . Referrals and appointments  In addition, I have reviewed and discussed with patient certain preventive protocols, quality metrics, and best practice recommendations. A written personalized care plan for preventive services as well as general preventive health recommendations were provided to patient.     Lauree Chandler, NP   05/31/2020   Nurse Notes: ***

## 2020-05-31 NOTE — Progress Notes (Signed)
Subjective:   Virginia [name] is a 84 y.o. female who presents for Medicare Annual (Subsequent) preventive examination.  Review of Systems     Cardiac Risk Factors include: advanced age (>14men, >7 women);obesity (BMI >30kg/m2);sedentary lifestyle;hypertension;dyslipidemia     Objective:    There were no vitals filed for this visit. There is no height or weight on file to calculate BMI.  Advanced Directives 05/31/2020 06/28/2019 05/30/2019 05/04/2019 04/28/2019 04/20/2019 12/14/2018  Does Patient Have a Medical Advance Directive? Yes Yes Yes Yes No No Yes  Type of Industrial/product designer of [place];Living will [place]  Does patient want to make changes to medical advance directive? No - Patient declined Yes (MAU/Ambulatory/Procedural Areas - Information given) - - - - No - Patient declined  Copy of [place] in Chart? No - copy requested No - copy requested No - copy requested No - copy requested - - No - copy requested  Would patient like information on creating a medical advance directive? - - - - No - Patient declined No - Patient declined -    Current Medications (verified) Outpatient Encounter Medications as of 05/31/2020  Medication Sig  . acetaminophen (TYLENOL) 500 MG tablet Take 500 mg by mouth 3 (three) times daily as needed.  Marland Kitchen aspirin EC 81 MG tablet Take 81 mg by mouth daily.  . Carbinoxamine Maleate 4 MG TABS Take 1 tablet (4 mg total) by mouth every 8 (eight) hours as needed.  . Cholecalciferol (D3 SUPER STRENGTH) 2000 units CAPS Take 1 capsule (2,000 Units total) by mouth daily.  Marland Kitchen donepezil (ARICEPT) 10 MG tablet Take 1 tablet (10 mg total) by mouth daily.  . furosemide (LASIX) 20 MG tablet TAKE 1 TABLET BY MOUTH ONCE DAILY  . ipratropium-albuterol (DUONEB) 0.5-2.5 (3) MG/3ML SOLN USE 1 VIAL IN NEBULIZER EVERY 6 HOURS - and as needed  .  memantine (NAMENDA) 10 MG tablet TAKE 1 TABLET BY MOUTH TWICE DAILY  . ondansetron (ZOFRAN-ODT) 4 MG disintegrating tablet Take 4 mg by mouth as needed for nausea or vomiting.  Marland Kitchen OVER THE COUNTER MEDICATION Mucinex guaifenesin liquid 200 mg 10 ml BID  . pantoprazole (PROTONIX) 40 MG tablet TAKE 1 TABLET(40 MG) BY MOUTH TWICE DAILY  . propranolol (INDERAL) 20 MG tablet TAKE 1 TABLET(20 MG) BY MOUTH TWICE DAILY  . QUEtiapine (SEROQUEL) 25 MG tablet Take 1 tablet (25 mg total) by mouth at bedtime.  . rosuvastatin (CRESTOR) 20 MG tablet Take 1 tablet (20 mg total) by mouth daily.  . sertraline (ZOLOFT) 50 MG tablet Take 2 tablets (100 mg total) by mouth daily.  . [DISCONTINUED] AMBULATORY NON FORMULARY MEDICATION 1.Home health Nurse to cleanse right shin area skin tear with saline,pat dry,apply triple antibiotic ointment to wound bed and cover with foam dressing for protection and absorption.change dressing every 3 days and as needed if soiled.Notify provider for any yellow drainage or if wound worsen.  2. Apply foam dressing to left lower leg bruise area for extra protection.  . [DISCONTINUED] UNABLE TO FIND Med Name: right hand compression glove wear daily for edema.   No facility-administered encounter medications on file as of 05/31/2020.    Allergies (verified) Lidocaine and Other   History: Past Medical History:  Diagnosis Date  . Anxiety 07/08/2017  . Arthritis   . Carotid artery disease ([place])    a. s/p L CEA. b. followed  by [facility].  . Carotid stenosis 10/19/2012  . Chronic diastolic CHF (congestive heart failure) ([place])   . CKD (chronic kidney disease), stage III   . Congestive heart disease ([place])   . COPD (chronic obstructive pulmonary disease) ([place])   . COPD GOLD IV 12/30/2014   PFTs 12/15/14 FEV1  0.57 (29%) ratio 54 p 17% resp to saba    . Cough 01/31/2016  . Dementia with behavioral disturbance ([place]) 07/08/2017  . Diverticulosis   . Dyspnea   . Essential hypertension 12/02/2014  . Fall  at home Sept. 2013  . Family history of adverse reaction to anesthesia    SON IS SLOW TO WAKE   . Gastroesophageal reflux disease 07/08/2017  . Hyperlipidemia   . Hypertension   . Hyponatremia 12/02/2014  . Hypotension 02/01/2017  . Incontinent of urine   . Internal hemorrhoid   . Intertrochanteric fracture ([place]) 04/20/2019  . Memory disorder 10/24/2014  . Nausea and vomiting 11/30/2014  . Primary osteoarthritis involving multiple joints 07/08/2017  . Vitamin D deficiency 02/15/2015   Past Surgical History:  Procedure Laterality Date  . APPENDECTOMY    . BREAST REDUCTION SURGERY    . CAROTID ENDARTERECTOMY     left CEA  . CATARACT EXTRACTION Bilateral   . COLONOSCOPY  2015  . EYE SURGERY     cataracts removed, bilaterally   . INTRAMEDULLARY (IM) NAIL INTERTROCHANTERIC Right 04/21/2019   Procedure: INTRAMEDULLARY (IM) NAIL INTERTROCHANTRIC;  Surgeon: Rod Can, MD;  Location: [facility];  Service: Orthopedics;  Laterality: Right;  . skin cancer removal    . TONSILLECTOMY     Family History  Problem Relation Age of Onset  . Heart disease Father        Before age 24  . Hypertension Father   . Heart attack Father   . Cancer Mother 9       Brain  . Dementia Brother   . Cancer Maternal Aunt 61       breast cancer  . Diabetes Maternal Aunt   . High blood pressure Brother   . Hypothyroidism Daughter   . High blood pressure Son   . High Cholesterol Son   . Diabetes Son   . COPD Son   . Obesity Son   . Heart failure Maternal Grandmother   . Diabetes Maternal Grandmother   . Stroke Neg Hx    Social History   Socioeconomic History  . Marital status: Divorced    Spouse name: Not on file  . Number of children: 2  . Years of education: 7  . Highest education level: Not on file  Occupational History  . Occupation: retired  Tobacco Use  . Smoking status: Former Smoker    Packs/day: 0.25    Years: 20.00    Pack years: 5.00    Types: Cigarettes    Quit date: 04/2019     Years since quitting: 1.1  . Smokeless tobacco: Never Used  . Tobacco comment: 4 or 5 cigarettes per day average  Vaping Use  . Vaping Use: Never used  Substance and Sexual Activity  . Alcohol use: Not Currently    Alcohol/week: 0.0 standard drinks  . Drug use: No  . Sexual activity: Not Currently    Birth control/protection: Post-menopausal  Other Topics Concern  . Not on file  Social History Narrative   04/[birth year] lives with son and brother, [name]   Diet:       Do you drink/eat things with caffeine: yes  Marital Status: Divorced   What Year Married:1959      Do you live in a house, apartment, Assisted Living, [place], trailer? House      Is it one or more stories? 2 stories      How many persons live in your home? Just Patient      Do you have any pets in your home? 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 Very Little   Type and how often? Walk      Do you have a living will? None   DNR?   Discuss one? No      Do you have signed POA/HPOA forms?      Patient drinks 1 cup of caffeine daily.   Patient is right handed.   Social Determinants of Health   Financial Resource Strain:   . Difficulty of Paying Living Expenses:   Food Insecurity:   . Worried About Charity fundraiser in the Last Year:   . Arboriculturist in the Last Year:   Transportation Needs:   . Film/video editor (Medical):   Marland Kitchen Lack of Transportation (Non-Medical):   Physical Activity:   . Days of Exercise per Week:   . Minutes of Exercise per Session:   Stress:   . Feeling of Stress :   Social Connections:   . Frequency of Communication with Friends and Family:   . Frequency of Social Gatherings with Friends and Family:   . Attends Religious Services:   . Active Member of Clubs or Organizations:   . Attends Archivist Meetings:   Marland Kitchen Marital Status:     Tobacco Counseling Counseling given: Not Answered Comment: 4 or 5 cigarettes per day  average   Clinical Intake:  Pre-visit preparation completed: Yes  Pain Type: Chronic pain Pain Location: Other (Comment) (right knee, right shoulder, back) Pain Descriptors / Indicators: Tender, Aching, Sore Pain Onset: More than a month ago Pain Frequency: Intermittent Pain Relieving Factors: tylenol, muscle rub Effect of Pain on Daily Activities: unable to walk, decrease ROM  Pain Relieving Factors: tylenol, muscle rub  Nutritional Risks: Failure to thrive Diabetes: No     Diabetic?no         Activities of Daily Living In your present state of health, do you have any difficulty performing the following activities: 05/31/2020  Hearing? N  Vision? Y  Difficulty concentrating or making decisions? Y  Walking or climbing stairs? Y  Comment bed bound  Dressing or bathing? Y  Comment total care  Doing errands, shopping? Y  Preparing Food and eating ? Y  Comment family prepares food and helps her eat  Using the Toilet? Y  In the past six months, have you accidently leaked urine? Y  Comment incontinent of bowel and bladder  Do you have problems with loss of bowel control? Y  Managing your Medications? Y  Managing your Finances? Y  Housekeeping or managing your Housekeeping? Y  Some recent data might be hidden    Patient Care Team: Lauree Chandler, NP as PCP - General (Nurse Practitioner) Fay Records, MD as PCP - Cardiology (Cardiology) Tanda Rockers, MD as Consulting Physician (Pulmonary Disease) Conan Bowens, RN as Registered Nurse [facility] and Palliative Medicine) Hilarie Fredrickson, Lajuan Lines, MD as Consulting Physician (Gastroenterology)  Indicate any recent Medical Services you may have received from other than [facility] providers in the past year (date  may be approximate).     Assessment:   This is a routine wellness examination for [place].  Hearing/Vision screen  Hearing Screening   125Hz  250Hz  500Hz  1000Hz  2000Hz  3000Hz  4000Hz  6000Hz  8000Hz   Right ear:            Left ear:           Comments: Patient has no hearing issues.  Vision Screening Comments: Vision is not good. Unable to get patient to eye appointments  Dietary issues and exercise activities discussed: Current Exercise Habits: The patient does not participate in regular exercise at present  Goals    . Increase water intake     Starting 03/30/2017 I will increase water intake 1-2 cups a day based on what cardiologist stays.      Depression Screen PHQ 2/9 Scores 05/31/2020 05/30/2019 05/20/2019 12/14/2018 07/01/2018 05/24/2018 03/30/2017  PHQ - 2 Score 0 0 0 0 0 0 0    Fall Risk Fall Risk  05/31/2020 11/07/2019 10/21/2019 08/03/2019 06/28/2019  Falls in the past year? 1 1 1 1 1   Number falls in past yr: 1 1 1  0 1  Injury with Fall? 0 1 1 1 1   Comment - - - - -  Risk for fall due to : - History of fall(s) - - History of fall(s);Impaired balance/gait;Impaired mobility    Any stairs in or around the home? Yes  If so, are there any without handrails? No  Home free of loose throw rugs in walkways, pet beds, electrical cords, etc? Yes  Adequate lighting in your home to reduce risk of falls? Yes   ASSISTIVE DEVICES UTILIZED TO PREVENT FALLS:  Life alert? No  Use of a cane, walker or w/c? Yes  Grab bars in the bathroom? Yes  Shower chair or bench in shower? Yes  Elevated toilet seat or a handicapped toilet? Yes   TIMED UP AND GO: unable  Cognitive Function: MMSE - Mini Mental State Exam 04/05/2018 11/19/2017 05/13/2017 03/30/2017 11/12/2016  Orientation to time 2 4 0 3 2  Orientation to Place 3 3 4 3 4   Registration 3 3 3 3 3   Attention/ Calculation 0 1 5 4 2   Recall 0 0 0 0 0  Language- name 2 objects 2 2 2 2 2   Language- repeat 0 0 1 1 1   Language- follow 3 step command 3 3 3 1 3   Language- read & follow direction 1 1 1 1 1   Write a sentence 1 1 1 1 1   Copy design 0 0 1 1 1   Total score 15 18 21 20 20      6CIT Screen 05/30/2019  What Year? 4 points  What month? 3 points  What time?  3 points  Count back from 20 4 points    Immunizations Immunization History  Administered Date(s) Administered  . Fluad Quad(high Dose 65+) 08/03/2019  . Influenza Split 08/01/2014  . Influenza, High Dose Seasonal PF 09/08/2017, 09/01/2018  . Influenza,inj,Quad PF,6+ Mos 08/16/2015, 08/15/2016  . Pneumococcal Conjugate-13 03/30/2017  . Pneumococcal-Unspecified 08/01/2014  . Tdap 01/16/2018    TDAP status: Up to date Flu Vaccine status: Up to date Pneumococcal vaccine status: Up to date Covid-19 vaccine status: Completed vaccines  Qualifies for Shingles Vaccine? Yes   Zostavax completed No   Shingrix Completed?: No.    Education has been provided regarding the importance of this vaccine. Patient has been advised to call insurance company to determine out of pocket expense if they have not  yet received this vaccine. Advised may also receive vaccine at local pharmacy or Health Dept. Verbalized acceptance and understanding.  Screening Tests Health Maintenance  Topic Date Due  . COVID-19 Vaccine (1) Never done  . INFLUENZA VACCINE  07/01/2020  . TETANUS/TDAP  01/17/2028  . DEXA SCAN  Completed  . PNA vac Low Risk Adult  Completed    Health Maintenance  Health Maintenance Due  Topic Date Due  . COVID-19 Vaccine (1) Never done    Colorectal cancer screening: No longer required.  Mammogram status: No longer required.  Unable to complete bone density  Lung Cancer Screening: (Low Dose CT Chest recommended if Age 15-80 years, 30 pack-year currently smoking OR have quit w/in 15years.) does not qualify.   Lung Cancer Screening Referral: na  Additional Screening:  Hepatitis C Screening: does not qualify; Completed na  Vision Screening: Recommended annual ophthalmology exams for early detection of glaucoma and other disorders of the eye. Is the patient up to date with their annual eye exam?  No  Who is the provider or what is the name of the office in which the patient  attends annual eye exams? Unable to go to eye appt due to mobility and dementia If pt is not established with a provider, would they like to be referred to a provider to establish care? No .   Dental Screening: Recommended annual dental exams for proper oral hygiene  Community Resource Referral / Chronic Care Management: CRR required this visit?  No   CCM required this visit?  No      Plan:     I have personally reviewed and noted the following in the patient's chart:   . Medical and social history . Use of alcohol, tobacco or illicit drugs  . Current medications and supplements . Functional ability and status . Nutritional status . Physical activity . Advanced directives . List of other physicians . Hospitalizations, surgeries, and ER visits in previous 12 months . Vitals . Screenings to include cognitive, depression, and falls . Referrals and appointments  In addition, I have reviewed and discussed with patient certain preventive protocols, quality metrics, and best practice recommendations. A written personalized care plan for preventive services as well as general preventive health recommendations were provided to patient.     Lauree Chandler, NP   05/31/2020

## 2020-05-31 NOTE — Patient Instructions (Signed)
[name] , Thank you for taking time to come for your Medicare Wellness Visit. I appreciate your ongoing commitment to your health goals. Please review the following plan we discussed and let me know if I can assist you in the future.   Screening recommendations/referrals: Colonoscopy aged out Mammogram aged out Bone Density unable to obtain due to debility  Recommended yearly ophthalmology/optometry visit for glaucoma screening and checkup Recommended yearly dental visit for hygiene and checkup  Vaccinations: Influenza vaccine up to date, due sept 2021 Pneumococcal vaccine up to date Tdap vaccine up to date  Shingles vaccine recommended,     Advanced directives: recommended to place on file, to discuss MOST form at next visit   Conditions/risks identified: advance age, decrease mobility, progressive memory loss  Next appointment: 1 year   Preventive Care 13 Years and Older, Female Preventive care refers to lifestyle choices and visits with your health care provider that can promote health and wellness. What does preventive care include?  A yearly physical exam. This is also called an annual well check.  Dental exams once or twice a year.  Routine eye exams. Ask your health care provider how often you should have your eyes checked.  Personal lifestyle choices, including:  Daily care of your teeth and gums.  Regular physical activity.  Eating a healthy diet.  Avoiding tobacco and drug use.  Limiting alcohol use.  Practicing safe sex.  Taking low-dose aspirin every day.  Taking vitamin and mineral supplements as recommended by your health care provider. What happens during an annual well check? The services and screenings done by your health care provider during your annual well check will depend on your age, overall health, lifestyle risk factors, and family history of disease. Counseling  Your health care provider may ask you questions about your:  Alcohol  use.  Tobacco use.  Drug use.  Emotional well-being.  Home and relationship well-being.  Sexual activity.  Eating habits.  History of falls.  Memory and ability to understand (cognition).  Work and work Statistician.  Reproductive health. Screening  You may have the following tests or measurements:  Height, weight, and BMI.  Blood pressure.  Lipid and cholesterol levels. These may be checked every 5 years, or more frequently if you are over 23 years old.  Skin check.  Lung cancer screening. You may have this screening every year starting at age 29 if you have a 30-pack-year history of smoking and currently smoke or have quit within the past 15 years.  Fecal occult blood test (FOBT) of the stool. You may have this test every year starting at age 77.  Flexible sigmoidoscopy or colonoscopy. You may have a sigmoidoscopy every 5 years or a colonoscopy every 10 years starting at age 44.  Hepatitis C blood test.  Hepatitis B blood test.  Sexually transmitted disease (STD) testing.  Diabetes screening. This is done by checking your blood sugar (glucose) after you have not eaten for a while (fasting). You may have this done every 1-3 years.  Bone density scan. This is done to screen for osteoporosis. You may have this done starting at age 97.  Mammogram. This may be done every 1-2 years. Talk to your health care provider about how often you should have regular mammograms. Talk with your health care provider about your test results, treatment options, and if necessary, the need for more tests. Vaccines  Your health care provider may recommend certain vaccines, such as:  Influenza vaccine. This is  recommended every year.  Tetanus, diphtheria, and acellular pertussis (Tdap, Td) vaccine. You may need a Td booster every 10 years.  Zoster vaccine. You may need this after age 38.  Pneumococcal 13-valent conjugate (PCV13) vaccine. One dose is recommended after age  62.  Pneumococcal polysaccharide (PPSV23) vaccine. One dose is recommended after age 50. Talk to your health care provider about which screenings and vaccines you need and how often you need them. This information is not intended to replace advice given to you by your health care provider. Make sure you discuss any questions you have with your health care provider. Document Released: 12/14/2015 Document Revised: 08/06/2016 Document Reviewed: 09/18/2015 Elsevier Interactive Patient Education  2017 [place] Prevention in the Home Falls can cause injuries. They can happen to people of all ages. There are many things you can do to make your home safe and to help prevent falls. What can I do on the outside of my home?  Regularly fix the edges of walkways and driveways and fix any cracks.  Remove anything that might make you trip as you walk through a door, such as a raised step or threshold.  Trim any bushes or trees on the path to your home.  Use bright outdoor lighting.  Clear any walking paths of anything that might make someone trip, such as rocks or tools.  Regularly check to see if handrails are loose or broken. Make sure that both sides of any steps have handrails.  Any raised decks and porches should have guardrails on the edges.  Have any leaves, snow, or ice cleared regularly.  Use sand or salt on walking paths during winter.  Clean up any spills in your garage right away. This includes oil or grease spills. What can I do in the bathroom?  Use night lights.  Install grab bars by the toilet and in the tub and shower. Do not use towel bars as grab bars.  Use non-skid mats or decals in the tub or shower.  If you need to sit down in the shower, use a plastic, non-slip stool.  Keep the floor dry. Clean up any water that spills on the floor as soon as it happens.  Remove soap buildup in the tub or shower regularly.  Attach bath mats securely with double-sided  non-slip rug tape.  Do not have throw rugs and other things on the floor that can make you trip. What can I do in the bedroom?  Use night lights.  Make sure that you have a light by your bed that is easy to reach.  Do not use any sheets or blankets that are too big for your bed. They should not hang down onto the floor.  Have a firm chair that has side arms. You can use this for support while you get dressed.  Do not have throw rugs and other things on the floor that can make you trip. What can I do in the kitchen?  Clean up any spills right away.  Avoid walking on wet floors.  Keep items that you use a lot in easy-to-reach places.  If you need to reach something above you, use a strong step stool that has a grab bar.  Keep electrical cords out of the way.  Do not use floor polish or wax that makes floors slippery. If you must use wax, use non-skid floor wax.  Do not have throw rugs and other things on the floor that can make you trip.  What can I do with my stairs?  Do not leave any items on the stairs.  Make sure that there are handrails on both sides of the stairs and use them. Fix handrails that are broken or loose. Make sure that handrails are as long as the stairways.  Check any carpeting to make sure that it is firmly attached to the stairs. Fix any carpet that is loose or worn.  Avoid having throw rugs at the top or bottom of the stairs. If you do have throw rugs, attach them to the floor with carpet tape.  Make sure that you have a light switch at the top of the stairs and the bottom of the stairs. If you do not have them, ask someone to add them for you. What else can I do to help prevent falls?  Wear shoes that:  Do not have high heels.  Have rubber bottoms.  Are comfortable and fit you well.  Are closed at the toe. Do not wear sandals.  If you use a stepladder:  Make sure that it is fully opened. Do not climb a closed stepladder.  Make sure that both  sides of the stepladder are locked into place.  Ask someone to hold it for you, if possible.  Clearly mark and make sure that you can see:  Any grab bars or handrails.  First and last steps.  Where the edge of each step is.  Use tools that help you move around (mobility aids) if they are needed. These include:  Canes.  Walkers.  Scooters.  Crutches.  Turn on the lights when you go into a dark area. Replace any light bulbs as soon as they burn out.  Set up your furniture so you have a clear path. Avoid moving your furniture around.  If any of your floors are uneven, fix them.  If there are any pets around you, be aware of where they are.  Review your medicines with your doctor. Some medicines can make you feel dizzy. This can increase your chance of falling. Ask your doctor what other things that you can do to help prevent falls. This information is not intended to replace advice given to you by your health care provider. Make sure you discuss any questions you have with your health care provider. Document Released: 09/13/2009 Document Revised: 04/24/2016 Document Reviewed: 12/22/2014 Elsevier Interactive Patient Education  2017 [employer].

## 2020-05-31 NOTE — Progress Notes (Signed)
This service is provided via telemedicine  No vital signs collected/recorded due to the encounter was a telemedicine visit.   Location of patient (ex: home, work):  Home  Patient consents to a telephone visit:  Yes, see telephone encounter dated 05/31/2020  Location of the provider (ex: office, home):  [facility]  Name of any referring provider:  N/A  Names of all persons participating in the telemedicine service and their role in the encounter:  Sherrie Mustache, Nurse Practitioner, Carroll Kinds, CMA, patient and [name] Patient's son.  Time spent on call:  7 minutes with medical assitant

## 2020-05-31 NOTE — Telephone Encounter (Signed)
[name], chiem are scheduled for a virtual visit with your provider today.    Just as we do with appointments in the office, we must obtain your consent to participate.  Your consent will be active for this visit and any virtual visit you may have with one of our providers in the next 365 days.    If you have a MyChart account, I can also send a copy of this consent to you electronically.  All virtual visits are billed to your insurance company just like a traditional visit in the office.  As this is a virtual visit, video technology does not allow for your provider to perform a traditional examination.  This may limit your provider's ability to fully assess your condition.  If your provider identifies any concerns that need to be evaluated in person or the need to arrange testing such as labs, EKG, etc, we will make arrangements to do so.    Although advances in technology are sophisticated, we cannot ensure that it will always work on either your end or our end.  If the connection with a video visit is poor, we may have to switch to a telephone visit.  With either a video or telephone visit, we are not always able to ensure that we have a secure connection.   I need to obtain your verbal consent now.   Are you willing to proceed with your visit today?   [name] son [name]) has provided verbal consent on 05/31/2020 for a virtual visit (video or telephone).   Carroll Kinds, CMA 05/31/2020  1:20 PM

## 2020-06-01 NOTE — Addendum Note (Signed)
Addended by: Lauree Chandler on: 06/01/2020 02:12 PM   Modules accepted: Level of Service

## 2020-06-04 ENCOUNTER — Encounter: Payer: Self-pay | Admitting: Physician Assistant

## 2020-06-04 NOTE — Progress Notes (Signed)
Virtual Visit via Telephone Note   This visit type was conducted due to national recommendations for restrictions regarding the COVID-19 Pandemic (e.g. social distancing) in an effort to limit this patient's exposure and mitigate transmission in our community.  Due to her co-morbid illnesses, this patient is at least at moderate risk for complications without adequate follow up.  This format is felt to be most appropriate for this patient at this time.  The patient did not have access to video technology/had technical difficulties with video requiring transitioning to audio format only (telephone).  All issues noted in this document were discussed and addressed.  No physical exam could be performed with this format.  Please refer to the patient's chart for her  consent to telehealth for [facility].   The patient was identified using 2 identifiers.  Date:  06/06/2020   ID:  Virginia [name], DOB 26-Dec-1935, MRN [id number]  Patient Location: Home Provider Location: Office  PCP:  Lauree Chandler, NP  Cardiologist:  Dorris Carnes, MD  Electrophysiologist:  None   Evaluation Performed:  Follow-Up Visit  Chief Complaint:  F/u CHF  History of Present Illness:    Virginia [name] is a 84 y.o. female with chronic diastolic CHF, COPD Gold 4 on home oxygen, hypertension, hyperlipidemia (previous issues with statins), left carotid artery stenosis s/p left CEA, CKD stage III, dementia, anxiety, fall with hip fracture repair in 04/2019. 2D echo 2016 LVEF 65 to 70% with grade 1 DD. She has been maintained on Lasix for history of lower extremity edema. She is followed by palliative care and has been residing in her home with help of family and caregiver assistance.  [name] assists with the conversation today as the patient is currently combative and unable to provide history. She is essentially bedbound at this point. Patient's brother Virginia [name] is also present during the conversation. She was seen  recently on 6/25 on a home visit due to increased SOB and cough. She was found to be wheezing with signs of aspiration. Per notes she was placed on steroids but subsequent to this developed extreme combativeness, agitation, scratching, hitting and screaming. Virginia [name] says the caregivers that assist with her care have suggested they may not be able to care for her if this state continues. She stopped the steroids 2 days ago but the behavior change continues. They spoke with neurology who called in Seroquel but she continues to struggle. She now has an open sore on her bottom and they're working on getting an air mattress. She has also had shingles and bullous pemphigoid around March. Daughter is really hopeful they can have someone come out to her house to do a full assessment, labwork and potentially provide a shingles vaccine. They last checked her BP when she was calmer on 06/01/20 and it was 123/72. They tried to check it this afternoon but she was very uncooperative. The first reading was 85/55 and the second 218/153, pulse 69, pulse ox 97. Daughter reports fluid has been well controlled on Lasix. Last week she pointed to her heart but they aren't able to discern what this really means.  Labs reviewed independently: 01/2020 LDL 87 (much improved), trig 294, HDL 47, AST wnl 11/2019 CBC wnl, K 4.0, Cr 0.99, albumin 3.6, TSH wnl    Past Medical History:  Diagnosis Date   Anxiety 07/08/2017   Arthritis    Carotid artery disease ([place])    a. s/p L CEA. b. followed by [facility].  Chronic diastolic CHF (congestive heart failure) (HCC)    CKD (chronic kidney disease), stage III    COPD (chronic obstructive pulmonary disease) (HCC)    COPD GOLD IV 12/30/2014   PFTs 12/15/14 FEV1  0.57 (29%) ratio 54 p 17% resp to saba     Cough 01/31/2016   Dementia with behavioral disturbance ([place]) 07/08/2017   Diverticulosis    Dyspnea    Essential hypertension 12/02/2014   Fall at home Sept. 2013   Family history of  adverse reaction to anesthesia    SON IS SLOW TO WAKE    Gastroesophageal reflux disease 07/08/2017   Hyperlipidemia    Hypertension    Hyponatremia 12/02/2014   Hypotension 02/01/2017   Incontinent of urine    Internal hemorrhoid    Intertrochanteric fracture ([place]) 04/20/2019   Memory disorder 10/24/2014   Nausea and vomiting 11/30/2014   Palliative care status    Primary osteoarthritis involving multiple joints 07/08/2017   Vitamin D deficiency 02/15/2015   Past Surgical History:  Procedure Laterality Date   APPENDECTOMY     BREAST REDUCTION SURGERY     CAROTID ENDARTERECTOMY     left CEA   CATARACT EXTRACTION Bilateral    COLONOSCOPY  2015   EYE SURGERY     cataracts removed, bilaterally    INTRAMEDULLARY (IM) NAIL INTERTROCHANTERIC Right 04/21/2019   Procedure: INTRAMEDULLARY (IM) NAIL INTERTROCHANTRIC;  Surgeon: Rod Can, MD;  Location: [place];  Service: Orthopedics;  Laterality: Right;   skin cancer removal     TONSILLECTOMY       Current Meds  Medication Sig   acetaminophen (TYLENOL) 500 MG tablet Take 500 mg by mouth 3 (three) times daily as needed.   aspirin EC 81 MG tablet Take 81 mg by mouth daily.   Carbinoxamine Maleate 4 MG TABS Take 1 tablet (4 mg total) by mouth every 8 (eight) hours as needed.   Cholecalciferol (D3 SUPER STRENGTH) 2000 units CAPS Take 1 capsule (2,000 Units total) by mouth daily.   donepezil (ARICEPT) 10 MG tablet Take 1 tablet (10 mg total) by mouth daily.   furosemide (LASIX) 20 MG tablet TAKE 1 TABLET BY MOUTH ONCE DAILY   ipratropium-albuterol (DUONEB) 0.5-2.5 (3) MG/3ML SOLN USE 1 VIAL IN NEBULIZER EVERY 6 HOURS - and as needed   memantine (NAMENDA) 10 MG tablet TAKE 1 TABLET BY MOUTH TWICE DAILY   ondansetron (ZOFRAN-ODT) 4 MG disintegrating tablet Take 4 mg by mouth as needed for nausea or vomiting.   OVER THE COUNTER MEDICATION Mucinex guaifenesin liquid 200 mg 10 ml BID   pantoprazole (PROTONIX) 40 MG  tablet TAKE 1 TABLET(40 MG) BY MOUTH TWICE DAILY   propranolol (INDERAL) 20 MG tablet TAKE 1 TABLET(20 MG) BY MOUTH TWICE DAILY   QUEtiapine (SEROQUEL) 25 MG tablet Take 25 mg by mouth 2 (two) times daily.   rosuvastatin (CRESTOR) 20 MG tablet Take 1 tablet (20 mg total) by mouth daily.   sertraline (ZOLOFT) 50 MG tablet Take 2 tablets (100 mg total) by mouth daily.     Allergies:   Lidocaine and Other   Social History   Tobacco Use   Smoking status: Former Smoker    Packs/day: 0.25    Years: 20.00    Pack years: 5.00    Types: Cigarettes    Quit date: 04/2019    Years since quitting: 1.1   Smokeless tobacco: Never Used   Tobacco comment: 4 or 5 cigarettes per day average  Vaping Use  Vaping Use: Never used  Substance Use Topics   Alcohol use: Not Currently    Alcohol/week: 0.0 standard drinks   Drug use: No     Family Hx: The patient's family history includes COPD in her son; Cancer (age of onset: 31) in her mother; Cancer (age of onset: 46) in her maternal aunt; Dementia in her brother; Diabetes in her maternal aunt, maternal grandmother, and son; Heart attack in her father; Heart disease in her father; Heart failure in her maternal grandmother; High Cholesterol in her son; High blood pressure in her brother and son; Hypertension in her father; Hypothyroidism in her daughter; Obesity in her son. There is no history of Stroke.  ROS:   Please see the history of present illness.   Limited by patient's dementia. All other systems reviewed and are negative.   Prior CV studies:   The following studies were reviewed today:  2D echo 12/2014 Study Conclusions   - Left ventricle: The cavity size was normal. Systolic function was  vigorous. The estimated ejection fraction was in the range of 65%  to 70%. Wall motion was normal; there were no regional wall  motion abnormalities. Doppler parameters are consistent with  abnormal left ventricular relaxation (grade  1 diastolic  dysfunction).  - Mitral valve: Calcified annulus.    Carotid duplex 2019 1-39 BICA, patent CEA   LE Arterial Testing 2019 nal Interpretation:  Right: 30-49% stenosis noted in the common femoral artery.     50-74% stenosis noted in the above-the-knee popliteal     artery.     Two vessel run-off via PTA and ATA Peroneal artery not     visualized.  Left: 30-49% stenosis noted in the common femoral artery.    30-49% stenosis noted in the proximal superficial femoral    artery at ostium.      30-49% stenosis noted in the above-the-knee popliteal artery.    Three vessel run-off.  *See table(s) above for measurements and observations.    Vascular consult recommended.  Electronically signed by Kathlyn Sacramento on 04/09/2018 at 5:25:19 PM.  Technically challenging exam secondary to constant patient movement.     Labs/Other Tests and Data Reviewed:    EKG:  An ECG dated 04/2019 was personally reviewed today and demonstrated:  NSR 79, possible LAE, TWI avL  Recent Labs: 11/02/2019: ALT 8; BUN 24; Creatinine, Ser 0.99; Hemoglobin 12.8; Platelets 240; Potassium 4.0; Sodium 144; TSH 0.561   Recent Lipid Panel Lab Results  Component Value Date/Time   CHOL 182 02/24/2020 02:44 PM   TRIG 294 (H) 02/24/2020 02:44 PM   HDL 47 02/24/2020 02:44 PM   CHOLHDL 3.9 02/24/2020 02:44 PM   CHOLHDL 2.9 12/10/2017 01:49 PM   LDLCALC 87 02/24/2020 02:44 PM   LDLCALC 111 (H) 12/10/2017 01:49 PM   LDLDIRECT 142.0 05/24/2015 11:08 AM    Wt Readings from Last 3 Encounters:  04/21/19 178 lb (80.7 kg)  12/14/18 190 lb (86.2 kg)  12/07/18 187 lb 1.9 oz (84.9 kg)     Objective:    Vital Signs:  Pulse 69    LMP  (LMP Unknown)    SpO2 97%    VS reviewed. Physical exam could not be performed due to telephone format - patient combative/uncooperative per daughter  ASSESSMENT & PLAN:    1. Agitation - the patient's daughter expresses distress at the  persistent agitation that has been going on - states it was present before the steroids but acutely worse. Despite discontinuation of  steroids and addition of Seroquel, they are still having a hard time getting control of her. They cannot cut her nails so as a result she is scratching her caregivers. I worry she needs a higher level of care than is currently provided as she may have a metabolic reason beyond steroids contributing to her delirium. They are hesitant to take her to the ER as they have preferred a home approach, but are hopeful for some kind of help. She is followed by palliative care and this is where I think they would be of most use for them, as they can provide the comprehensive evaluation and treatment appropriate for her current situation. They have alprazolam on hand but no specific parameters outlined for use. Will call [facility] to make them aware of the patient's decline and ask that they reach out to patient's family in the home ASAP. 2. Chronic diastolic CHF - per daughter, volume status has been stable on current dose of Lasix. 3. Essential HTN - BP unable to be reliably checked given her agitation - labile, hypotensive on first check, severe HTN on the other. HTN seems more likely in the setting of acute agitation - treatment would not be adjusting BP medications but rather treating acute agitation. This can be monitored in the context of treatment of #1. 4. Hyperlipidemia - continue Crestor as tolerated. LDL improved in March. 5. PAD - would follow conservatively at this time without prior testing as she is unlikely to comply with instructions at this time.   Time:   Today, I have spent 22 minutes with the patient with telehealth technology discussing the above problems.     Medication Adjustments/Labs and Tests Ordered: Current medicines are reviewed at length with the patient today.  Testing and concerns regarding medicines are outlined above.    Follow Up: in 3 months  with Dr. Harrington Challenger, via telemedicine as it is difficult for patient to leave the home  Signed, Charlie Pitter, PA-C  06/06/2020 3:03 PM    [place]

## 2020-06-05 ENCOUNTER — Encounter: Payer: Self-pay | Admitting: Neurology

## 2020-06-05 ENCOUNTER — Telehealth: Payer: Self-pay | Admitting: Neurology

## 2020-06-05 DIAGNOSIS — F02818 Dementia in other diseases classified elsewhere, unspecified severity, with other behavioral disturbance: Secondary | ICD-10-CM

## 2020-06-05 NOTE — Addendum Note (Signed)
Addended by: Kathrynn Ducking on: 06/05/2020 04:01 PM   Modules accepted: Orders

## 2020-06-05 NOTE — Telephone Encounter (Signed)
I talked to the daughter.  The patient was placed on steroids for cough recently, this has resulted in significant increased agitation.  The Seroquel dose was increased to 25 mg twice daily.  The patient has nails that have grown too long, needs to have discussed.  She has developed pressure sores, will need nursing evaluation.  I will try to get nursing to come in and make an assessment about needing an air mattress or mittens to protect caretakers.  The family has requested [employer].  The daughter also needs a letter for competence regarding this patient.  She will pick this up on 06 June 2020

## 2020-06-05 NOTE — Telephone Encounter (Signed)
I contacted the pt's daughter back. She sts over the past weekend the pt's aggressive/ combative behavior has increased. Pt is currently enrolled in palliative care service and the pt has scratched, kick and hit several care takers as well as her daughter.   The palliative care NP recommended on 06/01/2020 for Seroquel to be increased to 25 mg bid 1 in the am and 1 in the PM. Daughter sts  this adjustment has not helped and wanted to know if this medication should be increased?  Daughter also wanted to know if we could place an order for nail care to be done in the home because the pt will not allow her daughter to cut her nails due to her combative behavior? She wanted to know if prescription mittens could be given to help protect the pt from her long nails as well as the others caring for her?  Her last question is for the MD to rewrite the 12/12/2019 letter Judson Roch had completed stating the pt's competency level. I have formulated this letter and pended for MD to review.   I advised daughter that SS, NP is out of the office this week and I will send to her attending physician.

## 2020-06-05 NOTE — Telephone Encounter (Signed)
Shughart,[name](daughter on DPR) is asking for a call to discuss medications pt is on and change in behavior, daughter has asked that it be noted pt has bed sores on her bottom and is asking to be called as soon as possible.Marland Kitchen

## 2020-06-06 ENCOUNTER — Encounter: Payer: Self-pay | Admitting: Physician Assistant

## 2020-06-06 ENCOUNTER — Other Ambulatory Visit: Payer: Self-pay

## 2020-06-06 ENCOUNTER — Telehealth (INDEPENDENT_AMBULATORY_CARE_PROVIDER_SITE_OTHER): Payer: Medicare Other | Admitting: Physician Assistant

## 2020-06-06 VITALS — HR 69

## 2020-06-06 DIAGNOSIS — I10 Essential (primary) hypertension: Secondary | ICD-10-CM

## 2020-06-06 DIAGNOSIS — E782 Mixed hyperlipidemia: Secondary | ICD-10-CM | POA: Diagnosis not present

## 2020-06-06 DIAGNOSIS — R451 Restlessness and agitation: Secondary | ICD-10-CM | POA: Diagnosis not present

## 2020-06-06 DIAGNOSIS — I5032 Chronic diastolic (congestive) heart failure: Secondary | ICD-10-CM | POA: Diagnosis not present

## 2020-06-06 DIAGNOSIS — I739 Peripheral vascular disease, unspecified: Secondary | ICD-10-CM

## 2020-06-06 MED ORDER — QUETIAPINE FUMARATE 25 MG PO TABS: 25.0000 mg | ORAL_TABLET | Freq: Two times a day (BID) | ORAL | 1 refills | Status: DC

## 2020-06-06 NOTE — Patient Instructions (Addendum)
Medication Instructions:  Your physician recommends that you continue on your current medications as directed. Please refer to the Current Medication list given to you today.  *If you need a refill on your cardiac medications before your next appointment, please call your pharmacy*   Lab Work: None ordered  If you have labs (blood work) drawn today and your tests are completely normal, you will receive your results only by: Marland Kitchen MyChart Message (if you have MyChart) OR . A paper copy in the mail If you have any lab test that is abnormal or we need to change your treatment, we will call you to review the results.   Testing/Procedures: None ordered   Follow-Up: At [facility], you and your health needs are our priority.  As part of our continuing mission to provide you with exceptional heart care, we have created designated Provider Care Teams.  These Care Teams include your primary Cardiologist (physician) and Advanced Practice Providers (APPs -  Physician Assistants and Nurse Practitioners) who all work together to provide you with the care you need, when you need it.  We recommend signing up for the patient portal called "MyChart".  Sign up information is provided on this After Visit Summary.  MyChart is used to connect with patients for Virtual Visits (Telemedicine).  Patients are able to view lab/test results, encounter notes, upcoming appointments, etc.  Non-urgent messages can be sent to your provider as well.   To learn more about what you can do with MyChart, go to [email or web address].    Your next appointment:   3 month(s)  09/13/2020 4:20 WITH DR ROSS.Marland Kitchen VIRTUALLY..  The format for your next appointment:   Virtual  Provider:   You may see Dorris Carnes, MD or one of the following Advanced Practice Providers on your designated Care Team:    Richardson Dopp, PA-C  Robbie Lis, [place]    Other Instructions Dorthothy: I have contacted [facility] and have spoken with Erline Levine  and she will have someone contact you re: your mother. I wish you the best of luck, you are doing a great job!

## 2020-06-06 NOTE — Telephone Encounter (Signed)
[facility] [phone] called asking for clarification on dosage on patient Seroquel. (Noted by Shanda Howells.)

## 2020-06-06 NOTE — Telephone Encounter (Signed)
Letter mailed pts home for daughter about pts competence.

## 2020-06-06 NOTE — Telephone Encounter (Signed)
I called pharmacy about seroquel dosage being increase per family member. I stated it was increase by Dr. Jannifer Franklin on yesterday.The pharmacist stated they do not have the new prescription. I resent the new seroquel rx to pharmacy. While on the phone the pharmacist stated they receive it.

## 2020-06-07 ENCOUNTER — Telehealth: Payer: Self-pay | Admitting: *Deleted

## 2020-06-07 ENCOUNTER — Telehealth: Payer: Self-pay

## 2020-06-07 NOTE — Telephone Encounter (Signed)
Communication sent to patient's daughter, [name], to schedule a Palliative care home visit. Return communication received and visit is scheduled for tomorrow, 06/08/20@1 :30p.

## 2020-06-07 NOTE — Telephone Encounter (Signed)
Letter for pts daughter put in mailed yesterday to listed address.

## 2020-06-08 ENCOUNTER — Emergency Department (HOSPITAL_COMMUNITY): Payer: Medicare Other

## 2020-06-08 ENCOUNTER — Emergency Department (HOSPITAL_COMMUNITY)
Admission: EM | Admit: 2020-06-08 | Discharge: 2020-06-10 | Disposition: A | Discharge status: Home / Self Care | Payer: Medicare Other | Attending: Emergency Medicine | Admitting: Emergency Medicine

## 2020-06-08 ENCOUNTER — Other Ambulatory Visit: Payer: Self-pay

## 2020-06-08 ENCOUNTER — Other Ambulatory Visit: Payer: Medicare Other | Admitting: *Deleted

## 2020-06-08 ENCOUNTER — Encounter (HOSPITAL_COMMUNITY): Payer: Self-pay

## 2020-06-08 DIAGNOSIS — Z7982 Long term (current) use of aspirin: Secondary | ICD-10-CM | POA: Insufficient documentation

## 2020-06-08 DIAGNOSIS — R456 Violent behavior: Secondary | ICD-10-CM | POA: Diagnosis not present

## 2020-06-08 DIAGNOSIS — Z87891 Personal history of nicotine dependence: Secondary | ICD-10-CM | POA: Insufficient documentation

## 2020-06-08 DIAGNOSIS — Z79899 Other long term (current) drug therapy: Secondary | ICD-10-CM | POA: Insufficient documentation

## 2020-06-08 DIAGNOSIS — R4689 Other symptoms and signs involving appearance and behavior: Secondary | ICD-10-CM | POA: Diagnosis not present

## 2020-06-08 DIAGNOSIS — R001 Bradycardia, unspecified: Secondary | ICD-10-CM | POA: Diagnosis not present

## 2020-06-08 DIAGNOSIS — J9811 Atelectasis: Secondary | ICD-10-CM | POA: Diagnosis not present

## 2020-06-08 DIAGNOSIS — N183 Chronic kidney disease, stage 3 unspecified: Secondary | ICD-10-CM | POA: Diagnosis not present

## 2020-06-08 DIAGNOSIS — R4182 Altered mental status, unspecified: Secondary | ICD-10-CM | POA: Diagnosis present

## 2020-06-08 DIAGNOSIS — J449 Chronic obstructive pulmonary disease, unspecified: Secondary | ICD-10-CM | POA: Insufficient documentation

## 2020-06-08 DIAGNOSIS — F0391 Unspecified dementia with behavioral disturbance: Secondary | ICD-10-CM | POA: Diagnosis not present

## 2020-06-08 DIAGNOSIS — I130 Hypertensive heart and chronic kidney disease with heart failure and stage 1 through stage 4 chronic kidney disease, or unspecified chronic kidney disease: Secondary | ICD-10-CM | POA: Diagnosis not present

## 2020-06-08 DIAGNOSIS — Z20822 Contact with and (suspected) exposure to covid-19: Secondary | ICD-10-CM | POA: Diagnosis not present

## 2020-06-08 DIAGNOSIS — I6782 Cerebral ischemia: Secondary | ICD-10-CM | POA: Diagnosis not present

## 2020-06-08 DIAGNOSIS — R410 Disorientation, unspecified: Secondary | ICD-10-CM | POA: Diagnosis not present

## 2020-06-08 DIAGNOSIS — I509 Heart failure, unspecified: Secondary | ICD-10-CM | POA: Diagnosis not present

## 2020-06-08 DIAGNOSIS — Z515 Encounter for palliative care: Secondary | ICD-10-CM

## 2020-06-08 DIAGNOSIS — F0390 Unspecified dementia without behavioral disturbance: Secondary | ICD-10-CM | POA: Diagnosis present

## 2020-06-08 LAB — COMPREHENSIVE METABOLIC PANEL
ALT: 15 U/L (ref 0–44)
AST: 24 U/L (ref 15–41)
Albumin: 2.7 g/dL — ABNORMAL LOW (ref 3.5–5.0)
Alkaline Phosphatase: 44 U/L (ref 38–126)
Anion gap: 8 (ref 5–15)
BUN: 21 mg/dL (ref 8–23)
CO2: 31 mmol/L (ref 22–32)
Calcium: 8.3 mg/dL — ABNORMAL LOW (ref 8.9–10.3)
Chloride: 102 mmol/L (ref 98–111)
Creatinine, Ser: 1.03 mg/dL — ABNORMAL HIGH (ref 0.44–1.00)
GFR calc Af Amer: 59 mL/min — ABNORMAL LOW (ref >60)
GFR calc non Af Amer: 51 mL/min — ABNORMAL LOW (ref >60)
Glucose, Bld: 122 mg/dL — ABNORMAL HIGH (ref 70–99)
Potassium: 4.1 mmol/L (ref 3.5–5.1)
Sodium: 141 mmol/L (ref 135–145)
Total Bilirubin: 0.8 mg/dL (ref 0.3–1.2)
Total Protein: 5 g/dL — ABNORMAL LOW (ref 6.5–8.1)

## 2020-06-08 LAB — CBC WITH DIFFERENTIAL/PLATELET
Abs Immature Granulocytes: 0.06 10*3/uL (ref 0.00–0.07)
Basophils Absolute: 0 10*3/uL (ref 0.0–0.1)
Basophils Relative: 0 %
Eosinophils Absolute: 0.5 10*3/uL (ref 0.0–0.5)
Eosinophils Relative: 5 %
HCT: 40.4 % (ref 36.0–46.0)
Hemoglobin: 12.2 g/dL (ref 12.0–15.0)
Immature Granulocytes: 1 %
Lymphocytes Relative: 32 %
Lymphs Abs: 3 10*3/uL (ref 0.7–4.0)
MCH: 29.5 pg (ref 26.0–34.0)
MCHC: 30.2 g/dL (ref 30.0–36.0)
MCV: 97.8 fL (ref 80.0–100.0)
Monocytes Absolute: 0.9 10*3/uL (ref 0.1–1.0)
Monocytes Relative: 9 %
Neutro Abs: 5.1 10*3/uL (ref 1.7–7.7)
Neutrophils Relative %: 53 %
Platelets: 219 10*3/uL (ref 150–400)
RBC: 4.13 MIL/uL (ref 3.87–5.11)
RDW: 14.3 % (ref 11.5–15.5)
WBC: 9.6 10*3/uL (ref 4.0–10.5)
nRBC: 0 % (ref 0.0–0.2)

## 2020-06-08 LAB — URINALYSIS, ROUTINE W REFLEX MICROSCOPIC
Bilirubin Urine: NEGATIVE
Glucose, UA: NEGATIVE mg/dL
Hgb urine dipstick: NEGATIVE
Ketones, ur: NEGATIVE mg/dL
Leukocytes,Ua: NEGATIVE
Nitrite: NEGATIVE
Protein, ur: NEGATIVE mg/dL
Specific Gravity, Urine: 1.011 (ref 1.005–1.030)
pH: 5 (ref 5.0–8.0)

## 2020-06-08 LAB — SARS CORONAVIRUS 2 BY RT PCR (HOSPITAL ORDER, PERFORMED IN ~~LOC~~ HOSPITAL LAB): SARS Coronavirus 2: NEGATIVE

## 2020-06-08 MED ORDER — DICLOFENAC SODIUM 1 % EX GEL
4.0000 g | Freq: Once | CUTANEOUS | Status: AC
  Administered 2020-06-08: 4 g via TOPICAL
  Filled 2020-06-08: qty 100

## 2020-06-08 MED ORDER — DONEPEZIL HCL 5 MG PO TABS
10.0000 mg | ORAL_TABLET | Freq: Every day | ORAL | Status: DC
  Administered 2020-06-09 (×2): 10 mg via ORAL
  Filled 2020-06-08: qty 2
  Filled 2020-06-08: qty 1

## 2020-06-08 MED ORDER — ASPIRIN EC 81 MG PO TBEC
81.0000 mg | DELAYED_RELEASE_TABLET | Freq: Every morning | ORAL | Status: DC
  Administered 2020-06-09 – 2020-06-10 (×2): 81 mg via ORAL
  Filled 2020-06-08 (×2): qty 1

## 2020-06-08 MED ORDER — MEMANTINE HCL 10 MG PO TABS
10.0000 mg | ORAL_TABLET | Freq: Two times a day (BID) | ORAL | Status: DC
  Administered 2020-06-09 – 2020-06-10 (×4): 10 mg via ORAL
  Filled 2020-06-08 (×4): qty 1

## 2020-06-08 MED ORDER — IPRATROPIUM-ALBUTEROL 0.5-2.5 (3) MG/3ML IN SOLN
3.0000 mL | Freq: Four times a day (QID) | RESPIRATORY_TRACT | Status: DC | PRN
  Administered 2020-06-09 – 2020-06-10 (×2): 3 mL via RESPIRATORY_TRACT
  Filled 2020-06-08 (×2): qty 3

## 2020-06-08 MED ORDER — PROPRANOLOL HCL 40 MG PO TABS
20.0000 mg | ORAL_TABLET | Freq: Two times a day (BID) | ORAL | Status: DC
  Administered 2020-06-09 – 2020-06-10 (×4): 20 mg via ORAL
  Filled 2020-06-08 (×4): qty 1

## 2020-06-08 MED ORDER — PANTOPRAZOLE SODIUM 40 MG PO TBEC
40.0000 mg | DELAYED_RELEASE_TABLET | Freq: Two times a day (BID) | ORAL | Status: DC
  Administered 2020-06-09 – 2020-06-10 (×4): 40 mg via ORAL
  Filled 2020-06-08 (×4): qty 1

## 2020-06-08 MED ORDER — ROSUVASTATIN CALCIUM 5 MG PO TABS
20.0000 mg | ORAL_TABLET | Freq: Every evening | ORAL | Status: DC
  Administered 2020-06-09: 20 mg via ORAL
  Filled 2020-06-08: qty 4

## 2020-06-08 MED ORDER — DICLOFENAC SODIUM 1 % EX GEL
2.0000 g | Freq: Two times a day (BID) | CUTANEOUS | Status: DC
  Administered 2020-06-08 – 2020-06-10 (×4): 2 g via TOPICAL

## 2020-06-08 MED ORDER — LORAZEPAM 2 MG/ML IJ SOLN
1.0000 mg | Freq: Once | INTRAMUSCULAR | Status: AC
  Administered 2020-06-08: 1 mg via INTRAMUSCULAR
  Filled 2020-06-08: qty 1

## 2020-06-08 MED ORDER — SERTRALINE HCL 100 MG PO TABS
100.0000 mg | ORAL_TABLET | Freq: Every day | ORAL | Status: DC
  Administered 2020-06-09 – 2020-06-10 (×2): 100 mg via ORAL
  Filled 2020-06-08 (×2): qty 1

## 2020-06-08 MED ORDER — SODIUM CHLORIDE 0.9 % IV BOLUS
500.0000 mL | Freq: Once | INTRAVENOUS | Status: AC
  Administered 2020-06-08: 500 mL via INTRAVENOUS

## 2020-06-08 MED ORDER — FUROSEMIDE 20 MG PO TABS
20.0000 mg | ORAL_TABLET | Freq: Every morning | ORAL | Status: DC
  Administered 2020-06-09 – 2020-06-10 (×2): 20 mg via ORAL
  Filled 2020-06-08 (×2): qty 1

## 2020-06-08 MED ORDER — QUETIAPINE FUMARATE 25 MG PO TABS
25.0000 mg | ORAL_TABLET | Freq: Two times a day (BID) | ORAL | Status: DC
  Administered 2020-06-09 (×2): 25 mg via ORAL
  Filled 2020-06-08 (×2): qty 1

## 2020-06-08 MED ORDER — CARBINOXAMINE MALEATE 4 MG PO TABS
4.0000 mg | ORAL_TABLET | Freq: Two times a day (BID) | ORAL | Status: DC
  Administered 2020-06-09 – 2020-06-10 (×2): 4 mg via ORAL

## 2020-06-08 MED ORDER — ACETAMINOPHEN 500 MG PO TABS
1000.0000 mg | ORAL_TABLET | Freq: Two times a day (BID) | ORAL | Status: DC | PRN
  Administered 2020-06-09 – 2020-06-10 (×3): 1000 mg via ORAL
  Filled 2020-06-08 (×3): qty 2

## 2020-06-08 NOTE — Telephone Encounter (Signed)
Letter for daughter at front desk in envelope with pts name for pick up.

## 2020-06-08 NOTE — ED Notes (Signed)
Pt was able to urinate without the need of an In and Out cath.

## 2020-06-08 NOTE — BH Assessment (Signed)
Comprehensive Clinical Assessment (CCA) Screening, Triage and Referral Note  06/08/2020 Virginia [name] [id number] -Pt was brought to [facility] at the suggestion of palliative care nurse [name].  Virginia [name] had visited the home on 07/09 and witnessed patient being combative with caregivers.  Clinician talked with daughter in the room.  Patient was asleep, daughter said that she had been given sedative when she got there. Daughter is co-POA   She is worried about mother possibly losing caregivers because of her aggression.  She wants mother to be kept at home and to have some quality of life.    Daughter said that patient has been having aggressive behavior for over a year.  She has gotten worse lately.  Aggressive behavior includes hitting, biting, scratching, kicking.  Being verbally aggressive.  Per daughter, patient was a very kind person before her dementia developed.  Patient has no SI or A/V hallucinations.  She does not use ETOH or other drugs.    Pt has neurologist, cariologist and is followed by a NP at the [facility].  -Clinician discussed patient care with Lindon Romp, FNP who recommended social work consult.  He also recommended overnight observation and AM psych consult.  Clinician talked with Benedetto Goad, PA and she will put in the CSW consult and pt will stay at [facility] overnight.    Visit Diagnosis:    ICD-10-CM   1. Confusion  R41.0   2. Combative behavior  R46.89   3. Dementia with behavioral disturbance, unspecified dementia type ([place])  F03.91     Patient Reported Information How did you hear about [place]? Other (Comment)   Referral name: Virginia Penna, RN w/ [facility]   Referral phone number: No data recorded Whom do you see for routine medical problems? Primary Care   Practice/Facility Name: [facility]   Practice/Facility Phone Number: No data recorded  Name of Contact: Sherrie Mustache, NP   Contact Number: No data recorded  Contact Fax Number: No data  recorded  Prescriber Name: No data recorded  Prescriber Address (if known): No data recorded What Is the Reason for Your Visit/Call Today? Pt has been increasingly combative over the last few days.  This behavior has been present for over a year.  How Long Has This Been Causing You Problems? > than 6 months  Have You Recently Been in Any Inpatient Treatment (Hospital/Detox/Crisis Center/28-Day Program)? No   Name/Location of Program/Hospital:No data recorded  How Long Were You There? No data recorded  When Were You Discharged? No data recorded Have You Ever Received Services From [facility] Before? Yes   Who Do You See at [facility]? Dr. Jannifer Franklin (neurology) and Paulroth (cardiologst)  Have You Recently Had Any Thoughts About Hurting Yourself? No   Are You Planning to Commit Suicide/Harm Yourself At This time?  No  Have you Recently Had Thoughts About [place]? Yes (Will threaten to hurt others.  Hx of dementia.)   Explanation: No data recorded Have You Used Any Alcohol or Drugs in the Past 24 Hours? No   How Long Ago Did You Use Drugs or Alcohol?  No data recorded  What Did You Use and How Much? No data recorded What Do You Feel Would Help You the Most Today? Other (Comment) Lorrin Goodell psychiatry?)  Do You Currently Have a Therapist/Psychiatrist? No   Name of Therapist/Psychiatrist: No data recorded  Have You Been Recently Discharged From Any Office Practice or Programs? No   Explanation of Discharge From Practice/Program:  No data recorded    CCA Screening Triage Referral Assessment Type of Contact: Tele-Assessment   Is this Initial or Reassessment? Initial Assessment   Date Telepsych consult ordered in [facility]:  06/08/20   Time Telepsych consult ordered in [facility]:  2242  Patient Reported Information Reviewed? No   Patient Left Without Being Seen? No   Reason for Not Completing Assessment: No data recorded Collateral Involvement: Daughter Virginia Ege,  RN  Does Patient Have a [place]? No data recorded  Name and Contact of Legal Guardian:  No data recorded If Minor and Not Living with Parent(s), Who has Custody? No data recorded Is CPS involved or ever been involved? No data recorded Is APS involved or ever been involved? No data recorded Patient Determined To Be At Risk for Harm To Self or Others Based on Review of Patient Reported Information or Presenting Complaint? Yes, for Harm to Others   Method: No Plan   Availability of Means: No access or NA   Intent: Vague intent or NA   Notification Required: No need or identified person   Additional Information for Danger to Others Potential:  No data recorded  Additional Comments for Danger to Others Potential:  No data recorded  Are There Guns or Other Weapons in Your Home?  No    Types of Guns/Weapons: No data recorded   Are These Weapons Safely Secured?                              No data recorded   Who Could Verify You Are Able To Have These Secured:    No data recorded Do You Have any Outstanding Charges, Pending Court Dates, Parole/Probation? None  Contacted To Inform of Risk of Harm To Self or Others: Guardian/MH POA: (POA is aware of patient's hitting, kicking)  Location of Assessment: [facility]  Does Patient Present under Involuntary Commitment? No   IVC Papers Initial File Date: No data recorded  [place] of Residence: [place]  Patient Currently Receiving the Following Services: Medication Management   Determination of Need: Emergent (2 hours)   Options For Referral: No data recorded  Raymondo Band, LCAS

## 2020-06-08 NOTE — ED Provider Notes (Signed)
[facility] EMERGENCY DEPARTMENT Provider Note   CSN: [id number] Arrival date & time: 06/08/20  1513     History Chief Complaint  Patient presents with  . Altered Mental Status    Virginia [name] is a 84 y.o. female.  Virginia [name] is a 84 y.o. female with a history of dementia, COPD on chronic O2, CKD, CHF, CAD, hypertension, hyperlipidemia, GERD, arthritis, who presents to the emergency department accompanied by her daughter for evaluation of confusion and combative behavior.  On arrival patient is agitated and unwilling to cooperate or allow staff to get near her.  Patient's daughter provides all the history and states that a little over a week ago the patient was started on steroids for a COPD exacerbation.  She states that once patient started on steroid she developed worsening confusion and increasingly combative behavior.  The steroids were stopped on Monday, but the daughter reports the behavioral changes have persisted, she does think they have gotten slightly better, but she continues to be aggressive, and the daughter reports that some of their caregivers are refusing to come in to care for her due to concern for safety.  Palliative care nurse came out today for assessment and was concerned that there could be an underlying organic cause such as UTI and wanted her to come to the ED for further evaluation.  Patient has not had a fever.  Is at her baseline oxygen, daughter denies cough but does report some intermittent wheezing.  Daughter reports that they increased her Seroquel to try and better control symptoms without much success.  No other aggravating or alleviating factors.  Level 5 caveat: Dementia        Past Medical History:  Diagnosis Date  . Anxiety 07/08/2017  . Arthritis   . Carotid artery disease ([place])    a. s/p L CEA. b. followed by [facility].  . Chronic diastolic CHF (congestive heart failure) ([place])   . CKD (chronic kidney disease), stage III   . COPD  (chronic obstructive pulmonary disease) ([place])   . COPD GOLD IV 12/30/2014   PFTs 12/15/14 FEV1  0.57 (29%) ratio 54 p 17% resp to saba    . Cough 01/31/2016  . Dementia with behavioral disturbance ([place]) 07/08/2017  . Diverticulosis   . Dyspnea   . Essential hypertension 12/02/2014  . Fall at home Sept. 2013  . Family history of adverse reaction to anesthesia    SON IS SLOW TO WAKE   . Gastroesophageal reflux disease 07/08/2017  . Hyperlipidemia   . Hypertension   . Hyponatremia 12/02/2014  . Hypotension 02/01/2017  . Incontinent of urine   . Internal hemorrhoid   . Intertrochanteric fracture ([place]) 04/20/2019  . Memory disorder 10/24/2014  . Nausea and vomiting 11/30/2014  . Palliative care status   . Primary osteoarthritis involving multiple joints 07/08/2017  . Vitamin D deficiency 02/15/2015    Patient Active Problem List   Diagnosis Date Noted  . Displaced intertrochanteric fracture of right femur ([place]) 04/21/2019  . Closed intertrochanteric fracture of hip, right, initial encounter ([place]) 04/20/2019  . Leukocytosis 04/20/2019  . Fall at home, initial encounter 04/20/2019  . Primary osteoarthritis involving multiple joints 07/08/2017  . Dementia with behavioral disturbance ([place]) 07/08/2017  . Anxiety 07/08/2017  . Gastroesophageal reflux disease 07/08/2017  . Hypotension 02/01/2017  . CKD (chronic kidney disease), stage III 02/01/2017  . Cough 01/31/2016  . Vitamin D deficiency 02/15/2015  . Hyperlipidemia 01/18/2015  . COPD GOLD IV  12/30/2014  . Chronic diastolic CHF (congestive heart failure) ([place]) 12/08/2014  . Dyspnea   . Hyponatremia 12/02/2014  . Essential hypertension 12/02/2014  . Nausea and vomiting 11/30/2014  . Memory disorder 10/24/2014  . Carotid artery disease ([place]) 10/19/2012    Past Surgical History:  Procedure Laterality Date  . APPENDECTOMY    . BREAST REDUCTION SURGERY    . CAROTID ENDARTERECTOMY     left CEA  . CATARACT EXTRACTION Bilateral   .  COLONOSCOPY  2015  . EYE SURGERY     cataracts removed, bilaterally   . INTRAMEDULLARY (IM) NAIL INTERTROCHANTERIC Right 04/21/2019   Procedure: INTRAMEDULLARY (IM) NAIL INTERTROCHANTRIC;  Surgeon: Rod Can, MD;  Location: [facility];  Service: Orthopedics;  Laterality: Right;  . skin cancer removal    . TONSILLECTOMY       OB History   No obstetric history on file.     Family History  Problem Relation Age of Onset  . Heart disease Father        Before age 70  . Hypertension Father   . Heart attack Father   . Cancer Mother 79       Brain  . Dementia Brother   . Cancer Maternal Aunt 61       breast cancer  . Diabetes Maternal Aunt   . High blood pressure Brother   . Hypothyroidism Daughter   . High blood pressure Son   . High Cholesterol Son   . Diabetes Son   . COPD Son   . Obesity Son   . Heart failure Maternal Grandmother   . Diabetes Maternal Grandmother   . Stroke Neg Hx     Social History   Tobacco Use  . Smoking status: Former Smoker    Packs/day: 0.25    Years: 20.00    Pack years: 5.00    Types: Cigarettes    Quit date: 04/2019    Years since quitting: 1.1  . Smokeless tobacco: Never Used  . Tobacco comment: 4 or 5 cigarettes per day average  Vaping Use  . Vaping Use: Never used  Substance Use Topics  . Alcohol use: Not Currently    Alcohol/week: 0.0 standard drinks  . Drug use: No    Home Medications Prior to Admission medications   Medication Sig Start Date End Date Taking? Authorizing Provider  acetaminophen (TYLENOL) 500 MG tablet Take 1,000 mg by mouth in the morning and at bedtime.    Yes [provider]  aspirin EC 81 MG tablet Take 81 mg by mouth in the morning.    Yes [provider]  Carbinoxamine Maleate 4 MG TABS Take 1 tablet (4 mg total) by mouth every 8 (eight) hours as needed. Patient taking differently: Take 4 mg by mouth in the morning and at bedtime.  02/06/20  Yes Valentina Shaggy, MD  diclofenac Sodium  (VOLTAREN) 1 % GEL Apply 2 g topically in the morning and at bedtime.   Yes [provider]  donepezil (ARICEPT) 10 MG tablet Take 1 tablet (10 mg total) by mouth daily. Patient taking differently: Take 10 mg by mouth at bedtime.  05/07/20  Yes Suzzanne Cloud, NP  furosemide (LASIX) 20 MG tablet TAKE 1 TABLET BY MOUTH ONCE DAILY Patient taking differently: Take 20 mg by mouth in the morning.  12/06/19  Yes Fay Records, MD  ipratropium-albuterol (DUONEB) 0.5-2.5 (3) MG/3ML SOLN USE 1 VIAL IN NEBULIZER EVERY 6 HOURS - and as needed Patient  taking differently: Inhale 3 mLs into the lungs every 6 (six) hours as needed (wheezing / pneumonia).  05/15/20  Yes Lauree Chandler, NP  memantine (NAMENDA) 10 MG tablet TAKE 1 TABLET BY MOUTH TWICE DAILY Patient taking differently: Take 10 mg by mouth 2 (two) times daily.  05/14/20  Yes Lauree Chandler, NP  ondansetron (ZOFRAN-ODT) 4 MG disintegrating tablet Take 4 mg by mouth as needed for nausea or vomiting.   Yes [provider]  pantoprazole (PROTONIX) 40 MG tablet TAKE 1 TABLET(40 MG) BY MOUTH TWICE DAILY Patient taking differently: Take 40 mg by mouth 2 (two) times daily.  10/19/19  Yes Lauree Chandler, NP  propranolol (INDERAL) 20 MG tablet TAKE 1 TABLET(20 MG) BY MOUTH TWICE DAILY Patient taking differently: Take 20 mg by mouth 2 (two) times daily.  11/28/19  Yes Lauree Chandler, NP  QUEtiapine (SEROQUEL) 25 MG tablet Take 1 tablet (25 mg total) by mouth 2 (two) times daily. 06/06/20  Yes Kathrynn Ducking, MD  rosuvastatin (CRESTOR) 20 MG tablet Take 1 tablet (20 mg total) by mouth daily. Patient taking differently: Take 20 mg by mouth every evening.  11/10/19  Yes Fay Records, MD  sertraline (ZOLOFT) 50 MG tablet Take 2 tablets (100 mg total) by mouth daily. 05/07/20  Yes Suzzanne Cloud, NP  Cholecalciferol (D3 SUPER STRENGTH) 2000 units CAPS Take 1 capsule (2,000 Units total) by mouth daily. Patient not taking: Reported on  06/08/2020 07/01/18   Reed, Tiffany L, DO  OVER THE COUNTER MEDICATION Take 10 mLs by mouth in the morning and at bedtime. Mucinex guaifenesin liquid 200 mg 10 ml BID  Patient not taking: Reported on 06/08/2020    [provider]    Allergies    Lidocaine and Other  Review of Systems   Review of Systems  Unable to perform ROS: Dementia    Physical Exam Updated Vital Signs BP (!) 154/75 (BP Location: Right Arm)   Pulse (!) 102   Temp 98 F (36.7 C) (Oral)   Resp 17   LMP  (LMP Unknown)   Physical Exam Vitals and nursing note reviewed.  Constitutional:      General: She is not in acute distress.    Appearance: She is well-developed. She is not diaphoretic.     Comments: Alert, demented and combative swinging at staff and will not allow anyone to touch her  HENT:     Head: Normocephalic and atraumatic.     Mouth/Throat:     Mouth: Mucous membranes are moist.     Pharynx: Oropharynx is clear.  Eyes:     General:        Right eye: No discharge.        Left eye: No discharge.     Pupils: Pupils are equal, round, and reactive to light.  Cardiovascular:     Rate and Rhythm: Normal rate and regular rhythm.     Heart sounds: Normal heart sounds. No murmur heard.  No friction rub. No gallop.   Pulmonary:     Effort: Pulmonary effort is normal. No respiratory distress.     Breath sounds: Normal breath sounds. No wheezing or rales.     Comments: Respirations equal and unlabored, patient able to speak in full sentences, lungs clear to auscultation bilaterally Abdominal:     General: Bowel sounds are normal. There is no distension.     Palpations: Abdomen is soft. There is no mass.  Tenderness: There is no abdominal tenderness. There is no guarding.     Comments: Abdomen soft, nondistended, nontender to palpation in all quadrants without guarding or peritoneal signs  Musculoskeletal:        General: No deformity.     Cervical back: Neck supple.  Skin:    General: Skin is  warm and dry.     Capillary Refill: Capillary refill takes less than 2 seconds.     Comments: Well-healing skin tears noted to the left forearm, dressings changed  Neurological:     Mental Status: She is alert.     Coordination: Coordination normal.     Comments: Speech is clear but patient is unable to swallow commands making neurologic exam very limited, but she is moving all extremities with normal coordination  Psychiatric:        Attention and Perception: She is inattentive.        Mood and Affect: Mood normal. Affect is labile.        Behavior: Behavior is uncooperative and combative.     ED Results / Procedures / Treatments   Labs (all labs ordered are listed, but only abnormal results are displayed) Labs Reviewed  COMPREHENSIVE METABOLIC PANEL - Abnormal; Notable for the following components:      Result Value   Glucose, Bld 122 (*)    Creatinine, Ser 1.03 (*)    Calcium 8.3 (*)    Total Protein 5.0 (*)    Albumin 2.7 (*)    GFR calc non Af Amer 51 (*)    GFR calc Af Amer 59 (*)    All other components within normal limits  SARS CORONAVIRUS 2 BY RT PCR (HOSPITAL ORDER, [facility])  URINE CULTURE  CBC WITH DIFFERENTIAL/PLATELET  URINALYSIS, ROUTINE W REFLEX MICROSCOPIC    EKG EKG Interpretation  Date/Time:  Friday June 08 2020 15:32:35 EDT Ventricular Rate:  124 PR Interval:    QRS Duration: 99 QT Interval:  373 QTC Calculation: 484 R Axis:   55 Text Interpretation: Atrial fibrillation Ventricular tachycardia, unsustained Low voltage, extremity and precordial leads Baseline wander in lead(s) V6 When compared to prior, now afib. No STEMI Confirmed by Antony Blackbird [phone]) on 06/08/2020 4:01:05 PM   Radiology DG Chest 1 View  Result Date: 06/08/2020 CLINICAL DATA:  Altered mental status EXAM: CHEST  1 VIEW COMPARISON:  10/08/202005/20/2020, 02/02/2017 FINDINGS: Right lung is grossly clear. Hazy left CP angle opacity potentially due to  scarring, similar compared to prior. There may be superimposed atelectasis. Stable cardiomediastinal silhouette with aortic atherosclerosis. No pneumothorax. IMPRESSION: Probable mild atelectasis at the left base. Electronically Signed   By: Donavan Foil M.D.   On: 06/08/2020 18:20   CT Head Wo Contrast  Result Date: 06/08/2020 CLINICAL DATA:  Altered level of consciousness, confusion, combative, dementia EXAM: CT HEAD WITHOUT CONTRAST TECHNIQUE: Contiguous axial images were obtained from the base of the skull through the vertex without intravenous contrast. COMPARISON:  09/18/2014 FINDINGS: Brain: Hypodensities throughout the periventricular white matter and bilateral basal ganglia consistent with chronic small vessel ischemic change. No signs of acute infarct or hemorrhage. The lateral ventricles and remaining midline structures are unremarkable. No acute extra-axial fluid collections. No mass effect. Vascular: No hyperdense vessel or unexpected calcification. Skull: Normal. Negative for fracture or focal lesion. Sinuses/Orbits: No acute finding. Other: None. IMPRESSION: 1. Chronic small vessel ischemic changes, no acute intracranial process. Electronically Signed   By: Randa Ngo M.D.   On: 06/08/2020  18:50    Procedures Procedures (including critical care time)  Medications Ordered in ED Medications  Carbinoxamine Maleate TABS 4 mg (has no administration in time range)  acetaminophen (TYLENOL) tablet 1,000 mg (has no administration in time range)  aspirin EC tablet 81 mg (has no administration in time range)  ipratropium-albuterol (DUONEB) 0.5-2.5 (3) MG/3ML nebulizer solution 3 mL (3 mLs Inhalation Given 06/09/20 0121)  sertraline (ZOLOFT) tablet 100 mg (has no administration in time range)  rosuvastatin (CRESTOR) tablet 20 mg (has no administration in time range)  QUEtiapine (SEROQUEL) tablet 25 mg (25 mg Oral Given 06/09/20 0007)  propranolol (INDERAL) tablet 20 mg (20 mg Oral Given  06/09/20 0006)  diclofenac Sodium (VOLTAREN) 1 % topical gel 2 g (2 g Topical Given 06/08/20 2328)  furosemide (LASIX) tablet 20 mg (has no administration in time range)  donepezil (ARICEPT) tablet 10 mg (10 mg Oral Given 06/09/20 0005)  memantine (NAMENDA) tablet 10 mg (10 mg Oral Given 06/09/20 0005)  pantoprazole (PROTONIX) EC tablet 40 mg (40 mg Oral Given 06/09/20 0005)  LORazepam (ATIVAN) injection 1 mg (1 mg Intramuscular Given 06/08/20 1720)  sodium chloride 0.9 % bolus 500 mL (0 mLs Intravenous Stopped 06/08/20 2139)  diclofenac Sodium (VOLTAREN) 1 % topical gel 4 g (4 g Topical Given 06/08/20 2242)    ED Course  I have reviewed the triage vital signs and the nursing notes.  Pertinent labs & imaging results that were available during my care of the patient were reviewed by me and considered in my medical decision making (see chart for details).    MDM Rules/Calculators/A&P                          84 year old female presents with worsening confusion and combative behavior since she started steroids a little over a week ago, medication was stopped on Monday and combative behavior has improved slightly but still persist.  Was recommended by palliative nurse to come in for further evaluation of underlying organic cause.  Patient does have a history of some behavioral disturbance with her dementia before but this has been acutely worsening and they are having difficulty controlling behaviors with medication.  Patient is accompanied by her daughter who provides majority of history.  Patient has not had any associated fevers or focal symptoms.  Is on chronic oxygen which is unchanged.  Will get basic labs, chest x-ray, urinalysis, EKG and head CT.  On arrival patient is combative, will not allow staff to touch her and is hitting at staff, will give IM dose of Ativan.  I have independently ordered, reviewed and interpreted all labs and imaging: CBC: No leukocytosis, normal hemoglobin CMP: Creatinine of  1.03, glucose 122, mild hypocalcemia of 8.3, no other significant electrolyte derangements, normal liver function UA: No signs of infection CXR: Mild atelectasis but no pneumonia or other active cardiopulmonary disease Head CT: Atrophy and chronic microvascular changes, no acute intracranial findings.  Reassuring work-up but daughter is very concerned with persistent combative behavior, feel this may be coming from steroids that the patient has been on for a week, daughter does not feel comfortable taking the patient home, although patient has been calm and noncombative since receiving dose of Ativan.  Case discussed with Dr. Posey Pronto with [facility] hospitalist for possible admission for continued encephalopathy.  But Dr. Posey Pronto feels that this is more related to delirium with patient's dementia, there has been no organic cause identified and he feels patient would  be better served, with psychiatry consult in the ED and declines medical admission.  I discussed this with the patient's daughter with plans for TTS evaluation and she expresses understanding and agreement.  She is not sure if her family would be open to a [place] psych admission at this time but will discuss with them, at a minimum would appreciate help with medication adjustment and further recommendations, and does not feel that she can take the patient home tonight.  TTS evaluation completed and discussed with counselor Lattie Haw who discussed case with behavioral health NP and recommends observation overnight with patient reevaluation by psychiatry in the morning who also recommends social work consults.  Patient's home medications have been restarted and I have discussed this plan with the daughter.  The daughter has been allowed to stay with the patient in the ED overnight.  The patient remains calm and cooperative after receiving 1 dose of Ativan, she has been sleeping comfortably.  Patient is medically cleared and pending further evaluation  with psychiatry in the morning.  The patient has been placed in psychiatric observation due to the need to provide a safe environment for the patient while obtaining psychiatric consultation and evaluation, as well as ongoing medical and medication management to treat the patient's condition.  The patient has not been placed under full IVC at this time.   Final Clinical Impression(s) / ED Diagnoses Final diagnoses:  Confusion  Combative behavior  Dementia with behavioral disturbance, unspecified dementia type [facility])    Rx / DC Orders ED Discharge Orders    None       Jacqlyn Larsen, [place] 06/09/20 0144    Tegeler, Gwenyth Allegra, MD 06/11/20 [phone]

## 2020-06-08 NOTE — Telephone Encounter (Signed)
Pt's  (daughter) Virginia [name], Virginia [name] called regarding letter she requested to pick up at office. Inform daughter letter had been sent by mail on 06/07/20. Daughter asked to have a copy for her to pick up on Monday.07-12

## 2020-06-08 NOTE — Progress Notes (Signed)
COMMUNITY PALLIATIVE CARE RN NOTE  PATIENT NAME: Virginia [name] DOB: 1936-11-21 MRN: [id number]  PRIMARY CARE PROVIDER: Lauree Chandler, NP  RESPONSIBLE PARTY: [name] (daughter) Acct ID - Guarantor Home Phone Work Phone Relationship Acct Type  [id number] ANAELLE, DUNTON* [phone]  Self P/F     [address], [place], [place] [address]   Covid-19 Pre-screening Negative  PLAN OF CARE and INTERVENTION:  1. ADVANCE CARE PLANNING/GOALS OF CARE: Goal is to find out root of patient's worsening combative behaviors. She is a Full code 2. PATIENT/CAREGIVER EDUCATION: Symptom management, safe transfers, s/s of infection 3. DISEASE STATUS: Met with patient, her daughter [name], son [name], and brother, [name] in their home. Patient has been having issues with worsening combative and aggressive behaviors e.g scratching, hitting, kicking and pulling of hair. Caregivers have been having a difficult time providing personal care. She has two hired caregivers for 12 hours/day. On 6/25 she was placed on a Prednisone taper d/t audible expiratory wheezing and persistent ongoing cough for several weeks, per request from family. Prior to this, patient did have combative behaviors, but seemed to somewhat improve with the addition of Seroquel at bedtime. Family and caregivers state that patient's aggression began to worsen to the point where they were unsure if they would continue to provide care to patient. Her last dose of Prednisone was 4 days ago. During visit, patient was sitting up in her bed awake and alert. I observed the caregivers provide personal care to patient in order to change her soiled brief. She began scratching, hitting and yelling at them. She is currently taking Seroquel 25 mg by mouth twice daily.  I am unsure as to what the cause of patient's possible delirium is, so I recommended that she be assessed in the ED to find out the source such as possible infection, electrolyte imbalance, or  progression of her dementia. Family agreeable. Called EMS who transported patient to [facility] for evaluation.   HISTORY OF PRESENT ILLNESS: This is a 84 yo female with a diagnosis of dementia. Palliative care team continues to follow patient and visits monthly and PRN. She continues with 2 hired caregivers from [place] to 9p daily.      CODE STATUS: Full code ADVANCED DIRECTIVES: Y MOST FORM: no PPS: 30%   PHYSICAL EXAM:   VITALS: unable to obtain LUNGS: unable to assess CARDIAC: unable to assess EXTREMITIES: No edema SKIN: Reddened area noted to her left buttocks (not viewed well since covered with Zinc Oxide), skin tear to left forearm covered with Polymem dressing  NEURO: Alert and oriented to person/place, combative, irritable, total care   (Duration of visit and documentation 90 minutes)   [name], RN BSN

## 2020-06-08 NOTE — ED Triage Notes (Signed)
Pt BIB EMS from home due to being more combative and confused. Pt is visbility altered. Pt is hospice.Pt has h/o dementia.

## 2020-06-09 DIAGNOSIS — F0391 Unspecified dementia with behavioral disturbance: Secondary | ICD-10-CM | POA: Diagnosis not present

## 2020-06-09 DIAGNOSIS — R410 Disorientation, unspecified: Secondary | ICD-10-CM | POA: Diagnosis not present

## 2020-06-09 MED ORDER — DIVALPROEX SODIUM 125 MG PO CSDR
125.0000 mg | DELAYED_RELEASE_CAPSULE | Freq: Two times a day (BID) | ORAL | Status: DC
  Administered 2020-06-09 – 2020-06-10 (×3): 125 mg via ORAL
  Filled 2020-06-09 (×3): qty 1

## 2020-06-09 MED ORDER — QUETIAPINE FUMARATE 25 MG PO TABS
12.5000 mg | ORAL_TABLET | Freq: Two times a day (BID) | ORAL | Status: DC
  Administered 2020-06-10: 12.5 mg via ORAL
  Filled 2020-06-09 (×2): qty 1

## 2020-06-09 MED ORDER — QUETIAPINE FUMARATE 25 MG PO TABS
25.0000 mg | ORAL_TABLET | Freq: Every day | ORAL | Status: DC
  Administered 2020-06-09: 25 mg via ORAL
  Filled 2020-06-09: qty 1

## 2020-06-09 NOTE — ED Provider Notes (Signed)
Contacted by psychiatry with update.  Plan is to make medication adjustments and reassess patient tomorrow.  There is possibility for [facility] psych placement however daughter strongly against this as she will not be able to stay with patient there.  Patient does have daily palliative home care and daughter is agreeable with plan to possibly discharge home tomorrow. Daughter has no further questions at this time.   Flint Melter 06/09/20 [place], Julie, MD 06/10/20 [phone]

## 2020-06-09 NOTE — Consult Note (Signed)
[facility] Face-to-Face Psychiatry Consult   Reason for Consult:  Aggression Referring Physician:  EDP Patient Identification: Virginia [name] MRN:  [id number] Principal Diagnosis: Dementia with behavioral disturbance ([place]) Diagnosis:  Principal Problem:   Dementia with behavioral disturbance ([place])   Total Time spent with patient: 1 hour  Subjective:   Virginia [name] is a 84 y.o. female patient admitted with aggression.  HPI: Patient seen and evaluated in person by this provider.  Verbally she was slow to respond and did not seem to comprehend the questions asked.  Her daughter, co-POA, was at her beside and provided information along with her sons on the phone.  Virginia [name] has dementia and is being cared for by caregivers in her home.  She was started on prednisone recently for inflammation and her aggression/agitation increased significantly with the initiation of Risperdal which did not work, changed to Seroquel.  The prednisone was stopped by her combativeness continued until the caregivers said they could not care for her if she did not have more medications or adjustments.  The family's goal is to care for her at home and do not want geriatric psych hospitalization or placement.  Discussed medications and decided on increases in Seroquel and initiation of Depakote BID to assist with her behaviors while observing for 24 hours in the ED with re-evaluation tomorrow.  Dementia information and caregiver information provided to the daughter via videos by Derrel Nip, OT known for her work with dementia clients.  Daughter and family in agreement with the plan.  Past Psychiatric History: dementia, depression  Risk to Self:   Risk to Others:   Prior Inpatient Therapy:   Prior Outpatient Therapy:    Past Medical History:  Past Medical History:  Diagnosis Date  . Anxiety 07/08/2017  . Arthritis   . Carotid artery disease ([place])    a. s/p L CEA. b. followed by [facility].  . Chronic diastolic CHF (congestive  heart failure) ([place])   . CKD (chronic kidney disease), stage III   . COPD (chronic obstructive pulmonary disease) ([place])   . COPD GOLD IV 12/30/2014   PFTs 12/15/14 FEV1  0.57 (29%) ratio 54 p 17% resp to saba    . Cough 01/31/2016  . Dementia with behavioral disturbance ([place]) 07/08/2017  . Diverticulosis   . Dyspnea   . Essential hypertension 12/02/2014  . Fall at home Sept. 2013  . Family history of adverse reaction to anesthesia    SON IS SLOW TO WAKE   . Gastroesophageal reflux disease 07/08/2017  . Hyperlipidemia   . Hypertension   . Hyponatremia 12/02/2014  . Hypotension 02/01/2017  . Incontinent of urine   . Internal hemorrhoid   . Intertrochanteric fracture ([place]) 04/20/2019  . Memory disorder 10/24/2014  . Nausea and vomiting 11/30/2014  . Palliative care status   . Primary osteoarthritis involving multiple joints 07/08/2017  . Vitamin D deficiency 02/15/2015    Past Surgical History:  Procedure Laterality Date  . APPENDECTOMY    . BREAST REDUCTION SURGERY    . CAROTID ENDARTERECTOMY     left CEA  . CATARACT EXTRACTION Bilateral   . COLONOSCOPY  2015  . EYE SURGERY     cataracts removed, bilaterally   . INTRAMEDULLARY (IM) NAIL INTERTROCHANTERIC Right 04/21/2019   Procedure: INTRAMEDULLARY (IM) NAIL INTERTROCHANTRIC;  Surgeon: Rod Can, MD;  Location: [facility];  Service: Orthopedics;  Laterality: Right;  . skin cancer removal    . TONSILLECTOMY     Family History:  Family  History  Problem Relation Age of Onset  . Heart disease Father        Before age 61  . Hypertension Father   . Heart attack Father   . Cancer Mother 20       Brain  . Dementia Brother   . Cancer Maternal Aunt 61       breast cancer  . Diabetes Maternal Aunt   . High blood pressure Brother   . Hypothyroidism Daughter   . High blood pressure Son   . High Cholesterol Son   . Diabetes Son   . COPD Son   . Obesity Son   . Heart failure Maternal Grandmother   . Diabetes Maternal Grandmother   .  Stroke Neg Hx    Family Psychiatric  History: see above Social History:  Social History   Substance and Sexual Activity  Alcohol Use Not Currently  . Alcohol/week: 0.0 standard drinks     Social History   Substance and Sexual Activity  Drug Use No    Social History   Socioeconomic History  . Marital status: Divorced    Spouse name: Not on file  . Number of children: 2  . Years of education: 7  . Highest education level: Not on file  Occupational History  . Occupation: retired  Tobacco Use  . Smoking status: Former Smoker    Packs/day: 0.25    Years: 20.00    Pack years: 5.00    Types: Cigarettes    Quit date: 04/2019    Years since quitting: 1.1  . Smokeless tobacco: Never Used  . Tobacco comment: 4 or 5 cigarettes per day average  Vaping Use  . Vaping Use: Never used  Substance and Sexual Activity  . Alcohol use: Not Currently    Alcohol/week: 0.0 standard drinks  . Drug use: No  . Sexual activity: Not Currently    Birth control/protection: Post-menopausal  Other Topics Concern  . Not on file  Social History Narrative   [birth year] lives with son and brother, [name]   Diet:       Do you drink/eat things with caffeine: yes      Marital Status: Divorced   What Year Married:1959      Do you live in a house, apartment, Assisted Living, Condo, trailer? House      Is it one or more stories? 2 stories      How many persons live in your home? Just Patient      Do you have any pets in your home? 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 Very Little   Type and how often? Walk      Do you have a living will? None   DNR?   Discuss one? No      Do you have signed POA/HPOA forms?      Patient drinks 1 cup of caffeine daily.   Patient is right handed.   Social Determinants of Health   Financial Resource Strain:   . Difficulty of Paying Living Expenses:   Food Insecurity:   . Worried About Charity fundraiser in the Last Year:    . Arboriculturist in the Last Year:   Transportation Needs:   . Film/video editor (Medical):   Marland Kitchen Lack of Transportation (Non-Medical):   Physical Activity:   . Days of Exercise per Week:   . Minutes of Exercise per  Session:   Stress:   . Feeling of Stress :   Social Connections:   . Frequency of Communication with Friends and Family:   . Frequency of Social Gatherings with Friends and Family:   . Attends Religious Services:   . Active Member of Clubs or Organizations:   . Attends Archivist Meetings:   Marland Kitchen Marital Status:    Additional Social History:    Allergies:   Allergies  Allergen Reactions  . Lidocaine Anaphylaxis, Swelling, Rash and Other (See Comments)    Any of the " [facility] "  . Other Other (See Comments)    All drugs that end in "cane'-  novacane etc. Daughter states patient almost died when she had it when she was born    Labs:  Results for orders placed or performed during the hospital encounter of 06/08/20 (from the past 48 hour(s))  CBC with Differential     Status: None   Collection Time: 06/08/20  5:25 PM  Result Value Ref Range   WBC 9.6 4.0 - 10.5 K/uL   RBC 4.13 3.87 - 5.11 MIL/uL   Hemoglobin 12.2 12.0 - 15.0 g/dL   HCT 40.4 36 - 46 %   MCV 97.8 80.0 - 100.0 fL   MCH 29.5 26.0 - 34.0 pg   MCHC 30.2 30.0 - 36.0 g/dL   RDW 14.3 11.5 - 15.5 %   Platelets 219 150 - 400 K/uL   nRBC 0.0 0.0 - 0.2 %   Neutrophils Relative % 53 %   Neutro Abs 5.1 1.7 - 7.7 K/uL   Lymphocytes Relative 32 %   Lymphs Abs 3.0 0.7 - 4.0 K/uL   Monocytes Relative 9 %   Monocytes Absolute 0.9 0 - 1 K/uL   Eosinophils Relative 5 %   Eosinophils Absolute 0.5 0 - 0 K/uL   Basophils Relative 0 %   Basophils Absolute 0.0 0 - 0 K/uL   Immature Granulocytes 1 %   Abs Immature Granulocytes 0.06 0.00 - 0.07 K/uL    Comment: Performed at [facility], [address], [place], [place] [address]  Comprehensive metabolic panel     Status: Abnormal    Collection Time: 06/08/20  5:25 PM  Result Value Ref Range   Sodium 141 135 - 145 mmol/L   Potassium 4.1 3.5 - 5.1 mmol/L   Chloride 102 98 - 111 mmol/L   CO2 31 22 - 32 mmol/L   Glucose, Bld 122 (H) 70 - 99 mg/dL    Comment: Glucose reference range applies only to samples taken after fasting for at least 8 hours.   BUN 21 8 - 23 mg/dL   Creatinine, Ser 1.03 (H) 0.44 - 1.00 mg/dL   Calcium 8.3 (L) 8.9 - 10.3 mg/dL   Total Protein 5.0 (L) 6.5 - 8.1 g/dL   Albumin 2.7 (L) 3.5 - 5.0 g/dL   AST 24 15 - 41 U/L   ALT 15 0 - 44 U/L   Alkaline Phosphatase 44 38 - 126 U/L   Total Bilirubin 0.8 0.3 - 1.2 mg/dL   GFR calc non Af Amer 51 (L) >60 mL/min   GFR calc Af Amer 59 (L) >60 mL/min   Anion gap 8 5 - 15    Comment: Performed at [facility] [address] [place], [place] [address]  SARS Coronavirus 2 by RT PCR (hospital order, performed in [facility] hospital lab) Nasopharyngeal Nasopharyngeal Swab     Status:  None   Collection Time: 06/08/20  9:51 PM   Specimen: Nasopharyngeal Swab  Result Value Ref Range   SARS Coronavirus 2 NEGATIVE NEGATIVE    Comment: (NOTE) SARS-CoV-2 target nucleic acids are NOT DETECTED.  The SARS-CoV-2 RNA is generally detectable in upper and lower respiratory specimens during the acute phase of infection. The lowest concentration of SARS-CoV-2 viral copies this assay can detect is 250 copies / mL. A negative result does not preclude SARS-CoV-2 infection and should not be used as the sole basis for treatment or other patient management decisions.  A negative result may occur with improper specimen collection / handling, submission of specimen other than nasopharyngeal swab, presence of viral mutation(s) within the areas targeted by this assay, and inadequate number of viral copies (<250 copies / mL). A negative result must be combined with clinical observations, patient history, and epidemiological information.  Fact Sheet for Patients:    StrictlyIdeas.no  Fact Sheet for Healthcare Providers: BankingDealers.co.za  This test is not yet approved or  cleared by the [place] FDA and has been authorized for detection and/or diagnosis of SARS-CoV-2 by FDA under an Emergency Use Authorization (EUA).  This EUA will remain in effect (meaning this test can be used) for the duration of the COVID-19 declaration under Section 564(b)(1) of the Act, 21 U.S.C. section 360bbb-3(b)(1), unless the authorization is terminated or revoked sooner.  Performed at [facility], [place] [address], [place], [place] [address]   Urinalysis, Routine w reflex microscopic     Status: None   Collection Time: 06/08/20  9:54 PM  Result Value Ref Range   Color, Urine YELLOW YELLOW   APPearance CLEAR CLEAR   Specific Gravity, Urine 1.011 1.005 - 1.030   pH 5.0 5.0 - 8.0   Glucose, UA NEGATIVE NEGATIVE mg/dL   Hgb urine dipstick NEGATIVE NEGATIVE   Bilirubin Urine NEGATIVE NEGATIVE   Ketones, ur NEGATIVE NEGATIVE mg/dL   Protein, ur NEGATIVE NEGATIVE mg/dL   Nitrite NEGATIVE NEGATIVE   Leukocytes,Ua NEGATIVE NEGATIVE    Comment: Performed at [facility] [address], [place], [place] [address]    Current Facility-Administered Medications  Medication Dose Route Frequency Provider Last Rate Last Admin  . acetaminophen (TYLENOL) tablet 1,000 mg  1,000 mg Oral BID PRN Jacqlyn Larsen, PA-C   1,000 mg at 06/09/20 1104  . aspirin EC tablet 81 mg  81 mg Oral q AM Jacqlyn Larsen, PA-C   81 mg at 06/09/20 1105  . Carbinoxamine Maleate TABS 4 mg  4 mg Oral BID Benedetto Goad N, [place]      . diclofenac Sodium (VOLTAREN) 1 % topical gel 2 g  2 g Topical BID Benedetto Goad N, PA-C   2 g at 06/09/20 1112  . divalproex (DEPAKOTE SPRINKLE) capsule 125 mg  125 mg Oral Q12H Patrecia Pour, NP   125 mg at 06/09/20 1511  . donepezil (ARICEPT) tablet 10 mg  10 mg Oral QHS Benedetto Goad N, [place]   10 mg at  06/09/20 0005  . furosemide (LASIX) tablet 20 mg  20 mg Oral q AM Jacqlyn Larsen, PA-C   20 mg at 06/09/20 1104  . ipratropium-albuterol (DUONEB) 0.5-2.5 (3) MG/3ML nebulizer solution 3 mL  3 mL Inhalation Q6H PRN Jacqlyn Larsen, PA-C   3 mL at 06/09/20 0121  . memantine (NAMENDA) tablet 10 mg  10 mg Oral BID Benedetto Goad N, PA-C   10 mg at 06/09/20 1103  .  pantoprazole (PROTONIX) EC tablet 40 mg  40 mg Oral BID Jacqlyn Larsen, PA-C   40 mg at 06/09/20 1105  . propranolol (INDERAL) tablet 20 mg  20 mg Oral BID Benedetto Goad N, PA-C   20 mg at 06/09/20 1104  . QUEtiapine (SEROQUEL) tablet 12.5 mg  12.5 mg Oral BID Patrecia Pour, NP      . QUEtiapine (SEROQUEL) tablet 25 mg  25 mg Oral QHS Patrecia Pour, NP      . rosuvastatin (CRESTOR) tablet 20 mg  20 mg Oral QPM Ford, Kelsey N, PA-C      . sertraline (ZOLOFT) tablet 100 mg  100 mg Oral Daily Benedetto Goad N, PA-C   100 mg at 06/09/20 1104   Current Outpatient Medications  Medication Sig Dispense Refill  . acetaminophen (TYLENOL) 500 MG tablet Take 1,000 mg by mouth in the morning and at bedtime.     Marland Kitchen aspirin EC 81 MG tablet Take 81 mg by mouth in the morning.     . Carbinoxamine Maleate 4 MG TABS Take 1 tablet (4 mg total) by mouth every 8 (eight) hours as needed. (Patient taking differently: Take 4 mg by mouth in the morning and at bedtime. ) 60 tablet 5  . diclofenac Sodium (VOLTAREN) 1 % GEL Apply 2 g topically in the morning and at bedtime.    . donepezil (ARICEPT) 10 MG tablet Take 1 tablet (10 mg total) by mouth daily. (Patient taking differently: Take 10 mg by mouth at bedtime. ) 30 tablet 5  . furosemide (LASIX) 20 MG tablet TAKE 1 TABLET BY MOUTH ONCE DAILY (Patient taking differently: Take 20 mg by mouth in the morning. ) 90 tablet 2  . ipratropium-albuterol (DUONEB) 0.5-2.5 (3) MG/3ML SOLN USE 1 VIAL IN NEBULIZER EVERY 6 HOURS - and as needed (Patient taking differently: Inhale 3 mLs into the lungs every 6 (six) hours as needed  (wheezing / pneumonia). ) 3 mL 0  . memantine (NAMENDA) 10 MG tablet TAKE 1 TABLET BY MOUTH TWICE DAILY (Patient taking differently: Take 10 mg by mouth 2 (two) times daily. ) 60 tablet 5  . ondansetron (ZOFRAN-ODT) 4 MG disintegrating tablet Take 4 mg by mouth as needed for nausea or vomiting.    . pantoprazole (PROTONIX) 40 MG tablet TAKE 1 TABLET(40 MG) BY MOUTH TWICE DAILY (Patient taking differently: Take 40 mg by mouth 2 (two) times daily. ) 180 tablet 1  . propranolol (INDERAL) 20 MG tablet TAKE 1 TABLET(20 MG) BY MOUTH TWICE DAILY (Patient taking differently: Take 20 mg by mouth 2 (two) times daily. ) 180 tablet 1  . QUEtiapine (SEROQUEL) 25 MG tablet Take 1 tablet (25 mg total) by mouth 2 (two) times daily. 60 tablet 1  . rosuvastatin (CRESTOR) 20 MG tablet Take 1 tablet (20 mg total) by mouth daily. (Patient taking differently: Take 20 mg by mouth every evening. ) 90 tablet 3  . sertraline (ZOLOFT) 50 MG tablet Take 2 tablets (100 mg total) by mouth daily. 60 tablet 5  . Cholecalciferol (D3 SUPER STRENGTH) 2000 units CAPS Take 1 capsule (2,000 Units total) by mouth daily. (Patient not taking: Reported on 06/08/2020) 30 each 3  . OVER THE COUNTER MEDICATION Take 10 mLs by mouth in the morning and at bedtime. Mucinex guaifenesin liquid 200 mg 10 ml BID  (Patient not taking: Reported on 06/08/2020)      Musculoskeletal: Strength & Muscle Tone: decreased Gait & Station: did not witness Patient leans:  N/A  Psychiatric Specialty Exam: Physical Exam Vitals and nursing note reviewed.  Constitutional:      Appearance: Normal appearance.  HENT:     Head: Normocephalic.     Nose: Nose normal.  Cardiovascular:     Pulses: Normal pulses.  Musculoskeletal:        General: Normal range of motion.     Cervical back: Normal range of motion.  Neurological:     General: No focal deficit present.     Mental Status: She is alert and oriented to person, place, and time.  Psychiatric:         Attention and Perception: She is inattentive.        Mood and Affect: Mood is anxious. Affect is blunt.        Speech: Speech is delayed.        Behavior: Behavior is slowed.        Thought Content: Thought content normal.        Cognition and Memory: Cognition is impaired. Memory is impaired. She exhibits impaired recent memory and impaired remote memory.        Judgment: Judgment is impulsive.     Review of Systems  Psychiatric/Behavioral: Positive for agitation and behavioral problems. The patient is nervous/anxious.   All other systems reviewed and are negative.   Blood pressure 98/78, pulse 65, temperature 98.2 F (36.8 C), temperature source Axillary, resp. rate 18, SpO2 99 %.There is no height or weight on file to calculate BMI.  General Appearance: Casual  Eye Contact:  Fair  Speech:  Slow  Volume:  Decreased  Mood:  Anxious  Affect:  Blunt  Thought Process:  Coherent and Descriptions of Associations: Intact  Orientation:  Other:  person  Thought Content:  Logical  Suicidal Thoughts:  No  Homicidal Thoughts:  No  Memory:  Immediate;   Poor Recent;   Poor Remote;   Poor  Judgement:  Impaired  Insight:  Lacking  Psychomotor Activity:  Decreased  Concentration:  Concentration: Fair and Attention Span: Poor  Recall:  Poor  Fund of Knowledge:  Fair  Language:  Fair  Akathisia:  No  Handed:  Right  AIMS (if indicated):     Assets:  Housing Resilience Social Support  ADL's:  Impaired  Cognition:  Impaired,  Moderate  Sleep:        Treatment Plan Summary: Daily contact with patient to assess and evaluate symptoms and progress in treatment, Medication management and Plan dementia with behavioral changes:  -Continued Namenda 10 mg BID -Continued Aricept 10 mg daily -Increased Seroquel to 12.5 mg BID (8 am and 2 pm) along with her 25 mg at bedtime -Started Depakote 125 mg BID  Disposition: Patient does not meet criteria for psychiatric inpatient  admission. Supportive therapy provided about ongoing stressors.  Waylan Boga, NP 06/09/2020 4:21 PM

## 2020-06-09 NOTE — ED Notes (Addendum)
Pt and daughter to [facility] [place]. Daughter demanding pt have IV fluids started, tylenol given, breathing treatment given. Daughter demanding to stay overnight. Daughter made aware of visiting policy here in [place]. Daughter cursing and states that she does not want to stay here and will just take her mother home and to call an ambulance for her to leave. Explained to her that it doesn't work like that. Daughter states that she talked to someone named Beverely Low and was told that she would be admitted upstairs and that was what she was waiting on and now she is over here. Pt and daughter moved into room 1 and asked to wait there until this RN can figure out what is going on. To speak with PA and Agricultural consultant.

## 2020-06-09 NOTE — ED Notes (Addendum)
Daughter states pt will only takes meds w/chocolate pudding - RN requested for [facility] to send chocolate pudding w/pt's meals - advised will need to be prompted to do so w/each meal - Adding to pt's diet order. Daughter brought chocolate pudding - pt took meds - Tylenol given as requested by daughter - States daughter has pain w/right arm w/movement. Also moved pt from stretcher to bed for comfort - pt attempting to hit staff and yelling - pt being bathed as requested by daughter - Pure Wick intact. Daughter advising [facility] APP advised will change/adjust pt's meds today and for this evening and plan is for pt to be reassessed tomorrow (06/10/20) and possibly be d/c'd.

## 2020-06-09 NOTE — ED Notes (Signed)
Resp at bedside

## 2020-06-09 NOTE — ED Notes (Signed)
PA Ford to come and speak with daughter. SW to see pt and daughter in the am along with another psych re-eval. Pt has pure wick in place.

## 2020-06-09 NOTE — ED Notes (Signed)
Pt moved to Rm 53 via bed - Daughter has returned to bedside.

## 2020-06-09 NOTE — ED Notes (Signed)
Pharmacist verified home med - Carbinoxamine - advised may dispense from bedside.

## 2020-06-09 NOTE — ED Notes (Signed)
Daughter at bedside - requested for Swallow Screen to be performed - states pt's dr had wanted this performed but she has been unable to follow up d/t pt home bound. States pt has been eating/drinking w/o difficulty w/much encouragement. Advised per Richarda Blade, Charge RN, swallow screening is performed only on stroke pt's. Daughter voiced understanding. Daughter feeding pt a cookie and water - pt appears to be tolerating well.

## 2020-06-09 NOTE — ED Notes (Addendum)
Report from previous RN, Mitzi Hansen. Reported that this RN should probably reassess pts swallow screen. RN states that he gave her meds in ice cream mixed with hot chocolate (because she will only take them in chocolate pudding) stirred up really well. States that he would probably not give them to her again if he had to.  States that she did cough a lot when she took them and sounded worse after taking her meds.

## 2020-06-09 NOTE — ED Notes (Addendum)
Resp called to come assess pts breathing. Breathing tx ordered. Lungs clear bilat. Daughter states that she needs a tx very badly. This RN disagrees. Pt is very combative, trying to scratch and hit at this RN.

## 2020-06-09 NOTE — TOC Initial Note (Addendum)
Transition of Care [facility]) - Initial/Assessment Note    Patient Details  Name: Virginia [name] MRN: [id number] Date of Birth: 09-Sep-1936  [facility] CM/SW Contact:    Verdell Carmine, RN Phone Number: 06/09/2020, 10:29 AM  Clinical Narrative:                patient in [facility] [place] holding pending geri psych eval daughter is with patient. Patient non communicative at this time, snoring at times. Daughter states she wants to see labs, he brother has locked her out of mychart. multiple complaints about nurses not giving tylenol, stating her mom is in pain. Explained procedure for obtaining medical records. Discussed what is needed at home. Wants air mattress for hospital bed due to patient not being able to mobilize well.  difficult to get information pertaining to what is needed at home as daughter complains of   Other items, med list not followed, talked about the fact that just because she has home meds does not mean they are going to be ordered, there may be reasons behind not ordering them. Daughter writing everything said down. .talking about purewick at home, This would be out of pocket expense. but can research online , will give information .    Barriers to Discharge: Family Issues   Patient Goals and CMS Choice Patient states their goals for this hospitalization and ongoing recovery are:: patient sommulent      Expected Discharge Plan and Services                                                Prior Living Arrangements/Services                       Activities of Daily Living      Permission Sought/Granted                  Emotional Assessment              Admission diagnosis:  combative Patient Active Problem List   Diagnosis Date Noted  . Displaced intertrochanteric fracture of right femur ([place]) 04/21/2019  . Closed intertrochanteric fracture of hip, right, initial encounter ([place]) 04/20/2019  . Leukocytosis 04/20/2019  .  Fall at home, initial encounter 04/20/2019  . Primary osteoarthritis involving multiple joints 07/08/2017  . Dementia with behavioral disturbance ([place]) 07/08/2017  . Anxiety 07/08/2017  . Gastroesophageal reflux disease 07/08/2017  . Hypotension 02/01/2017  . CKD (chronic kidney disease), stage III 02/01/2017  . Cough 01/31/2016  . Vitamin D deficiency 02/15/2015  . Hyperlipidemia 01/18/2015  . COPD GOLD IV 12/30/2014  . Chronic diastolic CHF (congestive heart failure) ([place]) 12/08/2014  . Dyspnea   . Hyponatremia 12/02/2014  . Essential hypertension 12/02/2014  . Nausea and vomiting 11/30/2014  . Memory disorder 10/24/2014  . Carotid artery disease ([place]) 10/19/2012   PCP:  Lauree Chandler, NP Pharmacy:   [facility] [place], [place] - [address] [place] [address] Phone: [phone] Fax: [phone]  [facility] [phone] - [place], [place] [address] [place] [address] Phone: [phone] Fax: [phone]  [place], [address]  [place] [address] Phone: [phone] Fax: [phone]     Social Determinants of Health (SDOH) Interventions    Readmission Risk Interventions No flowsheet data found.

## 2020-06-09 NOTE — ED Notes (Signed)
Pt turned to side for comfort as daughter requested - Daughter advised she is leaving at this time to go home and shower and will bring back pt's home med that hospital does not provide - Asked if she may return to stay w/pt. Advised OK, however, discussed Medical Clearance Pt Policy and how this would be implemented if pt were to return to ED for same chief c/o or other medical clearance c/o's - Daughter voiced appreciation and understanding.

## 2020-06-09 NOTE — ED Notes (Signed)
Daughter noted to be sitting in chair at bedside w/eyes closed.

## 2020-06-09 NOTE — ED Notes (Signed)
Daughter noted to be at bedside.

## 2020-06-10 DIAGNOSIS — F0391 Unspecified dementia with behavioral disturbance: Secondary | ICD-10-CM | POA: Diagnosis not present

## 2020-06-10 DIAGNOSIS — R5381 Other malaise: Secondary | ICD-10-CM | POA: Diagnosis not present

## 2020-06-10 DIAGNOSIS — F29 Unspecified psychosis not due to a substance or known physiological condition: Secondary | ICD-10-CM | POA: Diagnosis not present

## 2020-06-10 DIAGNOSIS — R410 Disorientation, unspecified: Secondary | ICD-10-CM | POA: Diagnosis not present

## 2020-06-10 DIAGNOSIS — Z743 Need for continuous supervision: Secondary | ICD-10-CM | POA: Diagnosis not present

## 2020-06-10 DIAGNOSIS — R279 Unspecified lack of coordination: Secondary | ICD-10-CM | POA: Diagnosis not present

## 2020-06-10 LAB — URINE CULTURE: Culture: <10000 — AB

## 2020-06-10 MED ORDER — LORAZEPAM 2 MG/ML IJ SOLN
1.0000 mg | Freq: Once | INTRAMUSCULAR | Status: AC
  Administered 2020-06-10: 1 mg via INTRAMUSCULAR
  Filled 2020-06-10: qty 1

## 2020-06-10 MED ORDER — QUETIAPINE FUMARATE 25 MG PO TABS: 12.5000 mg | ORAL_TABLET | Freq: Three times a day (TID) | ORAL | 2 refills | Status: DC

## 2020-06-10 MED ORDER — DIVALPROEX SODIUM 125 MG PO CSDR: 125.0000 mg | DELAYED_RELEASE_CAPSULE | Freq: Two times a day (BID) | ORAL | 2 refills | Status: DC

## 2020-06-10 NOTE — ED Notes (Signed)
Pt's daughter removed IV tape and this Tech pulled the IV out.

## 2020-06-10 NOTE — Discharge Instructions (Signed)
Dementia °Dementia is a condition that affects the way the brain works. It often affects memory and thinking. There are many types of dementia. Some types get worse with time and cannot be reversed. Some types of dementia include: °· Alzheimer's disease. This is the most common type. °· Vascular dementia. This type may happen due to a stroke. °· Lewy body dementia. This type may happen to people who have Parkinson's disease. °· Frontotemporal dementia. This type is caused by damage to nerve cells in certain parts of the brain. °Some people may have more than one type, and this is called mixed dementia. °What are the causes? °This condition is caused by damage to cells in the brain. Some causes that cannot be reversed include: °· Having a condition that affects the blood vessels of the brain, such as diabetes, heart disease, or blood vessel disease. °· Changes to genes. °Some causes that can be reversed or slowed include: °· Injury to the brain. °· Certain medicines. °· Infection. °· Not having enough vitamin B12 in the body, or thyroid problems. °· A tumor or blood clot in the brain. °What are the signs or symptoms? °Symptoms depend on the type of dementia. This may include: °· Problems remembering things. °· Having trouble taking a bath or putting clothes on. °· Forgetting appointments. °· Forgetting to pay bills. °· Trouble planning and making meals. °· Having trouble speaking. °· Getting lost easily. °How is this treated? °Treatment depends on the cause of the dementia. It might include taking medicines that help: °· To control the dementia. °· To slow down the dementia. °· To manage symptoms. °In some cases, treating the cause of your dementia can improve symptoms, reverse symptoms, or slow down how quickly it gets worse. Your doctor can help you find support groups and other doctors who can help with your care. °Follow these instructions at home: °Medicines °· Take over-the-counter and prescription medicines  only as told by your doctor. °· Use a pill organizer to help you manage your medicines. °· Avoidtaking medicines for pain or for sleep. °Lifestyle °· Make healthy choices: °? Be active as told by your doctor. °? Do not use any products that contain nicotine or tobacco, such as cigarettes, e-cigarettes, and chewing tobacco. If you need help quitting, ask your doctor. °? Do not drink alcohol. °? When you get stressed, do something that will help you to relax. Your doctor can give you tips. °? Spend time with other people. °· Make sure you get good sleep. To get good sleep: °? Try not to take naps during the day. °? Keep your bedroom dark and cool. °? In the few hours before you go to bed, try not to do any exercise. °? Do not have foods and drinks with caffeine at night. °Eating and drinking °· Drink enough fluid to keep your pee (urine) pale yellow. °· Eat a healthy diet. °General instructions ° °· Talk with your doctor to figure out: °? What you need help with. °? What your safety needs are. °· Ask your doctor if it is safe for you to drive. °· If told, wear a bracelet that tracks where you are or shows that you are a person with memory loss. °· Work with your family to make big decisions. °· Keep all follow-up visits as told by your doctor. This is important. °Contact a doctor if: °· You have any new symptoms. °· Your symptoms get worse. °· You have problems with swallowing or choking. °Get help   right away if:  You feel very sad, or feel that you want to harm yourself.  You or your family members are worried for your safety. If you ever feel like you may hurt yourself or others, or have thoughts about taking your own life, get help right away. You can go to your nearest emergency department or call:  Your local emergency services (911 in the [place]  A suicide crisis helpline, such as the [facility] at 340-108-4838. This is open 24 hours a day. Summary  Dementia often affects  memory and thinking.  Some types of dementia get worse with time and cannot be reversed.  Treatment for this condition depends on the cause.  Talk with your doctor to figure out what you need help with.  Your doctor can help you find support groups and other doctors who can help with your care. This information is not intended to replace advice given to you by your health care provider. Make sure you discuss any questions you have with your health care provider. Document Revised: 02/01/2019 Document Reviewed: 02/01/2019 Elsevier Patient Education  [employer].   Dementia Caregiver Guide Dementia is a term used to describe a number of symptoms that affect memory and thinking. The most common symptoms include:  Memory loss.  Trouble with language and communication.  Trouble concentrating.  Poor judgment.  Problems with reasoning.  Child-like behavior and language.  Extreme anxiety.  Angry outbursts.  Wandering from home or public places. Dementia usually gets worse slowly over time. In the early stages, people with dementia can stay independent and safe with some help. In later stages, they need help with daily tasks such as dressing, grooming, and using the bathroom. How to help the person with dementia cope Dementia can be frightening and confusing. Here are some tips to help the person with dementia cope with changes caused by the disease. General tips  Keep the person on track with his or her routine.  Try to identify areas where the person may need help.  Be supportive, patient, calm, and encouraging.  Gently remind the person that adjusting to changes takes time.  Help with the tasks that the person has asked for help with.  Keep the person involved in daily tasks and decisions as much as possible.  Encourage conversation, but try not to get frustrated or harried if the person struggles to find words or does not seem to appreciate your  help. Communication tips  When the person is talking or seems frustrated, make eye contact and hold the person's hand.  Ask specific questions that need yes or no answers.  Use simple words, short sentences, and a calm voice. Only give one direction at a time.  When offering choices, limit them to just 1 or 2.  Avoid correcting the person in a negative way.  If the person is struggling to find the right words, gently try to help him or her. How to recognize symptoms of stress Symptoms of stress in caregivers include:  Feeling frustrated or angry with the person with dementia.  Denying that the person has dementia or that his or her symptoms will not improve.  Feeling hopeless and unappreciated.  Difficulty sleeping.  Difficulty concentrating.  Feeling anxious, irritable, or depressed.  Developing stress-related health problems.  Feeling like you have too little time for your own life. Follow these instructions at home:   Make sure that you and the person you are caring for: ? Get regular  sleep. ? Exercise regularly. ? Eat regular, nutritious meals. ? Drink enough fluid to keep your urine clear or pale yellow. ? Take over-the-counter and prescription medicines only as told by your health care providers. ? Attend all scheduled health care appointments.  Join a support group with others who are caregivers.  Ask about respite care resources so that you can have a regular break from the stress of caregiving.  Look for signs of stress in yourself and in the person you are caring for. If you notice signs of stress, take steps to manage it.  Consider any safety risks and take steps to avoid them.  Organize medications in a pill box for each day of the week.  Create a plan to handle any legal or financial matters. Get legal or financial advice if needed.  Keep a calendar in a central location to remind the person of appointments or other activities. Tips for reducing  the risk of injury  Keep floors clear of clutter. Remove rugs, magazine racks, and floor lamps.  Keep hallways well lit, especially at night.  Put a handrail and nonslip mat in the bathtub or shower.  Put childproof locks on cabinets that contain dangerous items, such as medicines, alcohol, guns, toxic cleaning items, sharp tools or utensils, matches, and lighters.  Put the locks in places where the person cannot see or reach them easily. This will help ensure that the person does not wander out of the house and get lost.  Be prepared for emergencies. Keep a list of emergency phone numbers and addresses in a convenient area.  Remove car keys and lock garage doors so that the person does not try to get in the car and drive.  Have the person wear a bracelet that tracks locations and identifies the person as having memory problems. This should be worn at all times for safety. Where to find support: Many individuals and organizations offer support. These include:  Support groups for people with dementia and for caregivers.  Counselors or therapists.  Home health care services.  Adult day care centers. Where to find more information Alzheimer's Association: [employer] Contact a health care provider if:  The person's health is rapidly getting worse.  You are no longer able to care for the person.  Caring for the person is affecting your physical and emotional health.  The person threatens himself or herself, you, or anyone else. Summary  Dementia is a term used to describe a number of symptoms that affect memory and thinking.  Dementia usually gets worse slowly over time.  Take steps to reduce the person's risk of injury, and to plan for future care.  Caregivers need support, relief from caregiving, and time for their own lives. This information is not intended to replace advice given to you by your health care provider. Make sure you discuss any questions you have with your  health care provider. Document Revised: 10/30/2017 Document Reviewed: 10/21/2016 Elsevier Patient Education  2020 [employer].

## 2020-06-10 NOTE — ED Notes (Addendum)
Pt completely soaked with urine. Pure wick out of place. Bed sheets, pillows, everything wet. As changing pt, noticed pt had a huge clay-like bm. Pt cleaned up. New diaper on. New chux in place. New sheets. Pt bathed. Pt combative entire time. Zinc cream applied to rectal area after cleaning per daughter. New pure wick put in place.

## 2020-06-10 NOTE — ED Provider Notes (Signed)
Emergency Medicine Observation Re-evaluation Note  Virginia [name] is a 84 y.o. female, seen on rounds today.  Pt initially presented to the ED for complaints of Altered Mental Status Currently, the patient is getting changed into her clothes. Patient bathed overnight and was combative.  Physical Exam  BP (!) 152/68 (BP Location: Left Arm)   Pulse 70   Temp 97.7 F (36.5 C) (Axillary)   Resp 18   LMP  (LMP Unknown)   SpO2 97%  Physical Exam  PE: Constitutional: well-developed, well-nourished, no apparent distress HENT: normocephalic, atraumatic. no cervical adenopathy Cardiovascular: normal rate and rhythm, distal pulses intact Pulmonary/Chest: effort normal; breath sounds clear and equal bilaterally; no wheezes or rales Abdominal: soft and nontender Musculoskeletal: no edema Neurological: at her baseline Skin: warm and dry, no rash, no diaphoresis   ED Course / MDM  EKG:EKG Interpretation  Date/Time:  Friday June 08 2020 15:32:35 EDT Ventricular Rate:  124 PR Interval:    QRS Duration: 99 QT Interval:  373 QTC Calculation: 484 R Axis:   55 Text Interpretation: Atrial fibrillation Ventricular tachycardia, unsustained Low voltage, extremity and precordial leads Baseline wander in lead(s) V6 When compared to prior, now afib. No STEMI Confirmed by Antony Blackbird [phone] on 06/08/2020 4:01:05 PM    I have reviewed the labs performed to date as well as medications administered while in observation.  Recent changes in the last 24 hours include none. Plan  Current plan is for discharge home via [facility]. Daughter is agreeable with plan of care. Patient is not under full IVC at this time.    Portions of this note were generated with [employer]. Dictation errors may occur despite best attempts at proofreading.    Cherre Robins, PA-C 06/10/20 1200    Varney Biles, MD 06/10/20 2107

## 2020-06-10 NOTE — ED Notes (Signed)
Bathed pt - limited d/t pt's toleration and c/o right arm pain w/movement. PureWick remains intact. New brief applied per daughter's request on top of other brief. New gown placed on pt d/t pt unable to tolerate manipulation of right arm to be placed in shirt.

## 2020-06-10 NOTE — Consult Note (Signed)
[facility] Psych ED Discharge  06/10/2020 10:08 AM [place] [name]  MRN:  [id number] Principal Problem: Dementia with behavioral disturbance [facility]) Discharge Diagnoses: Principal Problem:   Dementia with behavioral disturbance ([place])  Subjective: "I don't want any."  Patient seen and evaluated in person by this provider.  She was seen yesterday and discussed her care with her daughter, POA, at her bedside.  Reports her mother allowed her to brush her teeth and wash her face which has not happened in a long time.  Discussed medications and side effects.  Family would like her to return home today and follow up with her outpatient provider tomorrow.  Psychiatrically stable for discharge.  Total Time spent with patient: 45 minutes  Past Psychiatric History: dementia, anxiety, depression  Past Medical History:  Past Medical History:  Diagnosis Date  . Anxiety 07/08/2017  . Arthritis   . Carotid artery disease ([place])    a. s/p L CEA. b. followed by [facility].  . Chronic diastolic CHF (congestive heart failure) ([place])   . CKD (chronic kidney disease), stage III   . COPD (chronic obstructive pulmonary disease) ([place])   . COPD GOLD IV 12/30/2014   PFTs 12/15/14 FEV1  0.57 (29%) ratio 54 p 17% resp to saba    . Cough 01/31/2016  . Dementia with behavioral disturbance ([place]) 07/08/2017  . Diverticulosis   . Dyspnea   . Essential hypertension 12/02/2014  . Fall at home Sept. 2013  . Family history of adverse reaction to anesthesia    SON IS SLOW TO WAKE   . Gastroesophageal reflux disease 07/08/2017  . Hyperlipidemia   . Hypertension   . Hyponatremia 12/02/2014  . Hypotension 02/01/2017  . Incontinent of urine   . Internal hemorrhoid   . Intertrochanteric fracture ([place]) 04/20/2019  . Memory disorder 10/24/2014  . Nausea and vomiting 11/30/2014  . Palliative care status   . Primary osteoarthritis involving multiple joints 07/08/2017  . Vitamin D deficiency 02/15/2015    Past Surgical History:  Procedure Laterality  Date  . APPENDECTOMY    . BREAST REDUCTION SURGERY    . CAROTID ENDARTERECTOMY     left CEA  . CATARACT EXTRACTION Bilateral   . COLONOSCOPY  2015  . EYE SURGERY     cataracts removed, bilaterally   . INTRAMEDULLARY (IM) NAIL INTERTROCHANTERIC Right 04/21/2019   Procedure: INTRAMEDULLARY (IM) NAIL INTERTROCHANTRIC;  Surgeon: Rod Can, MD;  Location: [facility];  Service: Orthopedics;  Laterality: Right;  . skin cancer removal    . TONSILLECTOMY     Family History:  Family History  Problem Relation Age of Onset  . Heart disease Father        Before age 30  . Hypertension Father   . Heart attack Father   . Cancer Mother 24       Brain  . Dementia Brother   . Cancer Maternal Aunt 61       breast cancer  . Diabetes Maternal Aunt   . High blood pressure Brother   . Hypothyroidism Daughter   . High blood pressure Son   . High Cholesterol Son   . Diabetes Son   . COPD Son   . Obesity Son   . Heart failure Maternal Grandmother   . Diabetes Maternal Grandmother   . Stroke Neg Hx    Family Psychiatric  History: see above Social History:  Social History   Substance and Sexual Activity  Alcohol Use Not Currently  . Alcohol/week: 0.0 standard drinks  Social History   Substance and Sexual Activity  Drug Use No    Social History   Socioeconomic History  . Marital status: Divorced    Spouse name: Not on file  . Number of children: 2  . Years of education: 7  . Highest education level: Not on file  Occupational History  . Occupation: retired  Tobacco Use  . Smoking status: Former Smoker    Packs/day: 0.25    Years: 20.00    Pack years: 5.00    Types: Cigarettes    Quit date: 04/2019    Years since quitting: 1.1  . Smokeless tobacco: Never Used  . Tobacco comment: 4 or 5 cigarettes per day average  Vaping Use  . Vaping Use: Never used  Substance and Sexual Activity  . Alcohol use: Not Currently    Alcohol/week: 0.0 standard drinks  . Drug use: No  .  Sexual activity: Not Currently    Birth control/protection: Post-menopausal  Other Topics Concern  . Not on file  Social History Narrative   [birth year] lives with son and brother, [name]   Diet:       Do you drink/eat things with caffeine: yes      Marital Status: Divorced   What Year Married:1959      Do you live in a house, apartment, Assisted Living, Condo, trailer? House      Is it one or more stories? 2 stories      How many persons live in your home? Just Patient      Do you have any pets in your home? 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 Very Little   Type and how often? Walk      Do you have a living will? None   DNR?   Discuss one? No      Do you have signed POA/HPOA forms?      Patient drinks 1 cup of caffeine daily.   Patient is right handed.   Social Determinants of Health   Financial Resource Strain:   . Difficulty of Paying Living Expenses:   Food Insecurity:   . Worried About Charity fundraiser in the Last Year:   . Arboriculturist in the Last Year:   Transportation Needs:   . Film/video editor (Medical):   Marland Kitchen Lack of Transportation (Non-Medical):   Physical Activity:   . Days of Exercise per Week:   . Minutes of Exercise per Session:   Stress:   . Feeling of Stress :   Social Connections:   . Frequency of Communication with Friends and Family:   . Frequency of Social Gatherings with Friends and Family:   . Attends Religious Services:   . Active Member of Clubs or Organizations:   . Attends Archivist Meetings:   Marland Kitchen Marital Status:     Has this patient used any form of tobacco in the last 30 days? (Cigarettes, Smokeless Tobacco, Cigars, and/or Pipes) N/A  Current Medications: Current Facility-Administered Medications  Medication Dose Route Frequency Provider Last Rate Last Admin  . acetaminophen (TYLENOL) tablet 1,000 mg  1,000 mg Oral BID PRN Jacqlyn Larsen, PA-C   1,000 mg at 06/10/20 0947  .  aspirin EC tablet 81 mg  81 mg Oral q AM Jacqlyn Larsen, PA-C   81 mg at 06/10/20 0947  . Carbinoxamine Maleate TABS 4 mg  4 mg Oral  BID Benedetto Goad N, PA-C   4 mg at 06/10/20 0948  . diclofenac Sodium (VOLTAREN) 1 % topical gel 2 g  2 g Topical BID Benedetto Goad N, PA-C   2 g at 06/10/20 0948  . divalproex (DEPAKOTE SPRINKLE) capsule 125 mg  125 mg Oral Q12H Patrecia Pour, NP   125 mg at 06/10/20 0948  . donepezil (ARICEPT) tablet 10 mg  10 mg Oral QHS Benedetto Goad N, [place]   10 mg at 06/09/20 2232  . furosemide (LASIX) tablet 20 mg  20 mg Oral q AM Jacqlyn Larsen, PA-C   20 mg at 06/10/20 0946  . ipratropium-albuterol (DUONEB) 0.5-2.5 (3) MG/3ML nebulizer solution 3 mL  3 mL Inhalation Q6H PRN Jacqlyn Larsen, PA-C   3 mL at 06/09/20 0121  . memantine (NAMENDA) tablet 10 mg  10 mg Oral BID Jacqlyn Larsen, PA-C   10 mg at 06/10/20 0946  . pantoprazole (PROTONIX) EC tablet 40 mg  40 mg Oral BID Jacqlyn Larsen, PA-C   40 mg at 06/10/20 0948  . propranolol (INDERAL) tablet 20 mg  20 mg Oral BID Jacqlyn Larsen, PA-C   20 mg at 06/10/20 0947  . QUEtiapine (SEROQUEL) tablet 12.5 mg  12.5 mg Oral BID Patrecia Pour, NP   12.5 mg at 06/10/20 0946  . QUEtiapine (SEROQUEL) tablet 25 mg  25 mg Oral QHS Patrecia Pour, NP   25 mg at 06/09/20 2235  . rosuvastatin (CRESTOR) tablet 20 mg  20 mg Oral QPM Benedetto Goad N, PA-C   20 mg at 06/09/20 2000  . sertraline (ZOLOFT) tablet 100 mg  100 mg Oral Daily Benedetto Goad N, [place]   100 mg at 06/10/20 1638   Current Outpatient Medications  Medication Sig Dispense Refill  . acetaminophen (TYLENOL) 500 MG tablet Take 1,000 mg by mouth in the morning and at bedtime.     Marland Kitchen aspirin EC 81 MG tablet Take 81 mg by mouth in the morning.     . Carbinoxamine Maleate 4 MG TABS Take 1 tablet (4 mg total) by mouth every 8 (eight) hours as needed. (Patient taking differently: Take 4 mg by mouth in the morning and at bedtime. ) 60 tablet 5  . diclofenac Sodium (VOLTAREN) 1 % GEL  Apply 2 g topically in the morning and at bedtime.    . donepezil (ARICEPT) 10 MG tablet Take 1 tablet (10 mg total) by mouth daily. (Patient taking differently: Take 10 mg by mouth at bedtime. ) 30 tablet 5  . furosemide (LASIX) 20 MG tablet TAKE 1 TABLET BY MOUTH ONCE DAILY (Patient taking differently: Take 20 mg by mouth in the morning. ) 90 tablet 2  . ipratropium-albuterol (DUONEB) 0.5-2.5 (3) MG/3ML SOLN USE 1 VIAL IN NEBULIZER EVERY 6 HOURS - and as needed (Patient taking differently: Inhale 3 mLs into the lungs every 6 (six) hours as needed (wheezing / pneumonia). ) 3 mL 0  . memantine (NAMENDA) 10 MG tablet TAKE 1 TABLET BY MOUTH TWICE DAILY (Patient taking differently: Take 10 mg by mouth 2 (two) times daily. ) 60 tablet 5  . ondansetron (ZOFRAN-ODT) 4 MG disintegrating tablet Take 4 mg by mouth as needed for nausea or vomiting.    . pantoprazole (PROTONIX) 40 MG tablet TAKE 1 TABLET(40 MG) BY MOUTH TWICE DAILY (Patient taking differently: Take 40 mg by mouth 2 (two) times daily. ) 180 tablet 1  . propranolol (INDERAL) 20 MG tablet TAKE  1 TABLET(20 MG) BY MOUTH TWICE DAILY (Patient taking differently: Take 20 mg by mouth 2 (two) times daily. ) 180 tablet 1  . QUEtiapine (SEROQUEL) 25 MG tablet Take 1 tablet (25 mg total) by mouth 2 (two) times daily. 60 tablet 1  . rosuvastatin (CRESTOR) 20 MG tablet Take 1 tablet (20 mg total) by mouth daily. (Patient taking differently: Take 20 mg by mouth every evening. ) 90 tablet 3  . sertraline (ZOLOFT) 50 MG tablet Take 2 tablets (100 mg total) by mouth daily. 60 tablet 5  . Cholecalciferol (D3 SUPER STRENGTH) 2000 units CAPS Take 1 capsule (2,000 Units total) by mouth daily. (Patient not taking: Reported on 06/08/2020) 30 each 3  . OVER THE COUNTER MEDICATION Take 10 mLs by mouth in the morning and at bedtime. Mucinex guaifenesin liquid 200 mg 10 ml BID  (Patient not taking: Reported on 06/08/2020)     PTA Medications: (Not in a hospital  admission)   Musculoskeletal: Strength & Muscle Tone: decreased Gait & Station: did not witness Patient leans: Left  Psychiatric Specialty Exam: Physical Exam Vitals and nursing note reviewed.  Constitutional:      Appearance: Normal appearance.  HENT:     Head: Normocephalic.     Nose: Nose normal.  Pulmonary:     Effort: Pulmonary effort is normal.  Musculoskeletal:     Cervical back: Normal range of motion.  Neurological:     General: No focal deficit present.     Mental Status: She is alert.  Psychiatric:        Attention and Perception: She is inattentive.        Mood and Affect: Mood normal.        Speech: Speech is delayed.        Behavior: Behavior normal. Behavior is cooperative.        Thought Content: Thought content normal.        Cognition and Memory: Cognition is impaired. Memory is impaired. She exhibits impaired recent memory and impaired remote memory.        Judgment: Judgment is impulsive.     Review of Systems  Psychiatric/Behavioral: Positive for behavioral problems.  All other systems reviewed and are negative.   Blood pressure (!) 152/68, pulse 70, temperature 97.7 F (36.5 C), temperature source Axillary, resp. rate 18, SpO2 97 %.There is no height or weight on file to calculate BMI.  General Appearance: Casual  Eye Contact:  Fair  Speech:  Slow  Volume:  Normal  Mood:  Irritable at times  Affect:  Congruent  Thought Process:  Coherent and Descriptions of Associations: Intact  Orientation:  Other:  person  Thought Content:  Logical  Suicidal Thoughts:  No  Homicidal Thoughts:  No  Memory:  Immediate;   Poor Recent;   Poor Remote;   Poor  Judgement:  Fair  Insight:  Lacking  Psychomotor Activity:  Decreased  Concentration:  Concentration: Fair and Attention Span: Fair  Recall:  Poor  Fund of Knowledge:  Fair  Language:  Fair  Akathisia:  No  Handed:  Right  AIMS (if indicated):     Assets:  Housing Leisure Time Resilience Social  Support  ADL's:  Impaired  Cognition:  Impaired,  Moderate  Sleep:        Demographic Factors:  Age 75 or older and Caucasian  Loss Factors: NA  Historical Factors: Impulsivity  Risk Reduction Factors:   Sense of responsibility to family, Living with another person, especially a  relative, Positive social support and Positive therapeutic relationship  Continued Clinical Symptoms:  Irritable at times  Cognitive Features That Contribute To Risk:  Loss of executive function    Suicide Risk:  Minimal: No identifiable suicidal ideation.  Patients presenting with no risk factors but with morbid ruminations; may be classified as minimal risk based on the severity of the depressive symptoms   Plan Of Care/Follow-up recommendations:  Dementia with behavioral disturbances, unspecified type: -Continue Depakote 125 mg BID -Continue Seroquel 12.5 mg at 8 am and 2 pm along with 25 mg at bedtime -Continue Namenda 10 mg BID -Continue Aricept 10 mg daily -Follow up with outpatient provider Activity:  as tolerated Diet:  heart healthy diet  Disposition: discharge home Waylan Boga, NP 06/10/2020, 10:08 AM

## 2020-06-10 NOTE — ED Notes (Signed)
Breakfast ordered 

## 2020-06-10 NOTE — ED Notes (Signed)
Daughter advised she discussed tx plan w/brother who is in agreement for pt to be d/c'd to home w/rx's and voiced understanding of increasing meds as directed in 2 days if needed.

## 2020-06-10 NOTE — ED Notes (Signed)
HHN being given per pt's daughter request. [facility] has been requested for transport - to arrive after 1230 per daughter's request. Daughter contacting [place].

## 2020-06-10 NOTE — ED Notes (Signed)
Daughter present when [facility] arrived to transport pt. Daughter had requested for [facility] to transport pt at 1230 d/t waiting for Home Health to be notified and arrive. Prior to [facility]'s arrival, daughter stating "I hope we're not paying the staff to do nothing when my mom is here" - then [facility] arrived. Daughter verified she all of pt's belongings.

## 2020-06-10 NOTE — ED Notes (Signed)
Pt noted to be sleeping - Daughter sleeping in chair at bedside.

## 2020-06-11 ENCOUNTER — Other Ambulatory Visit: Payer: Medicare Other | Admitting: *Deleted

## 2020-06-14 ENCOUNTER — Telehealth: Payer: Self-pay | Admitting: Neurology

## 2020-06-14 DIAGNOSIS — G301 Alzheimer's disease with late onset: Secondary | ICD-10-CM

## 2020-06-14 NOTE — Telephone Encounter (Signed)
Called Waldron Session, clinical manager with [name] who stated patient has [facility] POA, and Dr Jannifer Franklin wrote letter about patient 's competency. Lattie Haw stated the daughter cannot find the letter mailed to her on 06/06/20. Lattie Haw asked for a signed copy to be faxed to her attention at Fax # [phone]. She is aware Dr Jannifer Franklin is out of office, returns Monday. Letter printed and placed on Dr Jannifer Franklin' desk for signature.

## 2020-06-14 NOTE — Telephone Encounter (Signed)
Nurse, Lattie Haw from [place], [phone]) called would like to speak with Dr. Tobey Grim nurse. Sent to work in Marine scientist.

## 2020-06-18 NOTE — Addendum Note (Signed)
Addended by: Kathrynn Ducking on: 06/18/2020 04:07 PM   Modules accepted: Orders

## 2020-06-18 NOTE — Telephone Encounter (Signed)
The letter was regenerated and signed.

## 2020-06-18 NOTE — Telephone Encounter (Signed)
Called daughter, [place] [name] who stated [facility] needs orders for PT for training with caregivers to use [facility] lift. She stated she talked to nurse with [facility] today who stated they don't have orders for patient. I advised the referral was sent on 06/07/20, but it wasn't sent for PT. She stated her mom needs [facility] lift and PT must train caregivers on how to use. I advised I'll  call [facility]. Called [facility],spoke with Nickie who stated they couldn't take referral due to staffing shortage. I advised her that our office wasn't notified they didn't accept referral. She apologized.

## 2020-06-18 NOTE — Telephone Encounter (Signed)
I called and talk with the daughter.  Apparently she has spoke with the nurse from [facility], she was told that there was availability that a physical therapist that they preferred, [name], was available and could come out to the house.  I will put in an order for this.  I also talked about getting an air bed, the daughter was told that she would be eligible for this, but the daughter is not clear whether or not she has a decubitus ulcer.  I may have a nurse from [facility] come out and evaluate her for this.

## 2020-06-18 NOTE — Telephone Encounter (Signed)
Patient's daughter called concerning wanting services from [facility]. Please call her back at [phone].

## 2020-06-18 NOTE — Telephone Encounter (Signed)
Letter faxed to Waldron Session, clinical manager with [name]. Received confirmation of fax.

## 2020-06-28 ENCOUNTER — Telehealth: Payer: Self-pay | Admitting: Neurology

## 2020-06-28 NOTE — Telephone Encounter (Signed)
Called antoine at [facility], LM that returning call.

## 2020-06-28 NOTE — Telephone Encounter (Signed)
[place] [name] Session) request nurse to call her. Would like to discuss Physical Therapy consult and air mattress.

## 2020-07-02 NOTE — Telephone Encounter (Signed)
Spoke to [employer] with [place]. She was asking about pts order.  She will call [facility] and see if delay or able to do the referral if not will call us back to change HHA.

## 2020-07-17 ENCOUNTER — Other Ambulatory Visit: Payer: Medicare Other | Admitting: *Deleted

## 2020-07-17 ENCOUNTER — Telehealth: Payer: Self-pay | Admitting: *Deleted

## 2020-07-17 ENCOUNTER — Other Ambulatory Visit: Payer: Self-pay

## 2020-07-17 DIAGNOSIS — Z515 Encounter for palliative care: Secondary | ICD-10-CM

## 2020-07-17 NOTE — Telephone Encounter (Signed)
Received a call from patient's brother, [name], stating that patient has a sore behind her ear from the oxygen tubing and requested a home visit to assess. Visit scheduled today for 3:30p.

## 2020-07-20 ENCOUNTER — Telehealth: Payer: Self-pay | Admitting: Nurse Practitioner

## 2020-07-22 ENCOUNTER — Other Ambulatory Visit: Payer: Self-pay | Admitting: Nurse Practitioner

## 2020-07-22 DIAGNOSIS — E0590 Thyrotoxicosis, unspecified without thyrotoxic crisis or storm: Secondary | ICD-10-CM

## 2020-07-22 DIAGNOSIS — I10 Essential (primary) hypertension: Secondary | ICD-10-CM

## 2020-07-22 DIAGNOSIS — R251 Tremor, unspecified: Secondary | ICD-10-CM

## 2020-07-24 ENCOUNTER — Other Ambulatory Visit: Payer: Self-pay | Admitting: Nurse Practitioner

## 2020-07-24 DIAGNOSIS — J440 Chronic obstructive pulmonary disease with acute lower respiratory infection: Secondary | ICD-10-CM

## 2020-07-26 NOTE — Telephone Encounter (Signed)
[place] [name] to [facility] (Emergency Contact)   TM  1:59 PM I called to make a follow up appointment for the patient either in office or a My Chart video call. The son said he would call back to make the appt, they couldnt make it right now.        Patient daughter, [place] [name] called back and stated that she is not going to be making an appointment for Tuesday for TeleVisit. Stated that patient does not need anything at the moment and she didn't want to waste her Tuesday and Hair appointment on the Telephone.  Stated that she will call back for a Telephone Visit when needed. Stated patient is bed bound and cannot come into office. Has Palliative Care coming to Home.

## 2020-07-27 NOTE — Telephone Encounter (Signed)
Would be willing to do a video visit but needs follow up for [place] to continue to be able to prescribe her medications.

## 2020-07-31 ENCOUNTER — Telehealth: Payer: Self-pay | Admitting: *Deleted

## 2020-07-31 NOTE — Telephone Encounter (Signed)
Will get letter ready, Dr Mariea Clonts will sign. She does need to make a follow up visit (she is overdue) This can be video visit.

## 2020-07-31 NOTE — Telephone Encounter (Signed)
Patient daughter, [name] called stating that the letter [place] [name] wrote back in March has to be signed by a MD.  Requesting the same letter to be printed and signed by MD.  Stated that once it was ready she will pick up.  Please Advise.

## 2020-08-03 NOTE — Telephone Encounter (Signed)
Message left on clinical intake voicemail:   [place]ia [name] was not aware letter was composed on 07/31/2020. [name] asked that we hold letter until Tuesday for pick-up.    I returned call to Virginia [name], I informed her that I will print letter and the first available Dr.Reed can sign will be Thursday 9/9/2. Virginia [name] was ok with waiting til Thursday and asked if we can mail to her @  [address] [place], [place] [address]   [place] [name] states she could not schedule an appointment for her schedule is crazy right now and she does not know if she is coming or going. Virginia [name] will call back to schedule a follow-up once she receives letter

## 2020-08-03 NOTE — Progress Notes (Signed)
COMMUNITY PALLIATIVE CARE RN NOTE  PATIENT NAME: Virginia [name] HEADINGS DOB: 1935/12/10 MRN: [id number]  PRIMARY CARE PROVIDER: Lauree Chandler, NP  RESPONSIBLE PARTY:  Acct ID - Guarantor Home Phone Work Phone Relationship Acct Type  [id number] ALESSANDRIA, HENKEN* [phone]  Self P/F     [address] [place], [place] [address]   Covid-19 Pre-screening Negative  PLAN OF CARE and INTERVENTION:  1. ADVANCE CARE PLANNING/GOALS OF CARE: Goal is for patient to remain in her home.  2. PATIENT/CAREGIVER EDUCATION: Symptom management, management of skin breakdown 3. DISEASE STATUS: Met with patient, brother [name], son [name], and hired caregivers in the home. Patient is lying in bed awake and alert. She is able to answer simple questions. She is intermittently confused. Her mood has improved since changes have been made to her medications. She is more pleasant, but does have some behaviors with personal care, but much more manageable. She remains on continuous oxygen. No shortness of breath or wheezing noted. Continues with nebulizers twice daily. Her appetite is variable. She eats smaller portion sizes, and some meals may only eat a few bites. She is averaging at least one good meal per day. She takes her medications crushed in applesauce. She drinks glucerna daily. She does cough occasionally with food/fluids. They make sure that they are elevating the head of the bed to help since she is at high risk of aspiration. She is transferred via [place] to her wheelchair occasionally, but spends most of her time lying in bed. She is incontinent of both bowel and bladder. [name] states that she does experience pain at times with movement of her right shoulder. She takes Tylenol twice daily routinely. She has a sore behind her left ear where the oxygen has rubbed a layer of skin off. Instructed family to place oxygen pads around the ears of the cannula to help. Instructed them to wash area with warm soap and water and  apply an antibiotic ointment. Also recommended that they try to keep her hair off of this area. Unable to place dressing d/t location of sore. She also has some small blisters that have appeared on her right abdomen. Advised to use skin prep daily. Blister to left abdomen has almost resolved. She has a skin tear to her left hand and left forearm. Current dressing is dry and intact. Will continue to monitor.   HISTORY OF PRESENT ILLNESS: This is a 84 yo female with a diagnosis of dementia. Palliative care team continues to follow patient and visits monthly and PRN. She continues with 2 hired caregivers from [place] to 9p daily.    CODE STATUS: Full code  ADVANCED DIRECTIVES: Y MOST FORM: no PPS: 30%   PHYSICAL EXAM:   LUNGS: clear to auscultation  CARDIAC: Cor RRR EXTREMITIES: No edema SKIN: Skin tear to left hand and left forearm, sore behind left ear, small blisters x 2 on right abdomen, blister almost resolved to left abdomen  NEURO: Alert and oriented to self, intermittent confusion, increased generalized weakness, non-ambulatory   (Duration of visit and documentation 75 minutes)   [name], RN BSN

## 2020-08-03 NOTE — Telephone Encounter (Signed)
Left message on voicemail for patient to return call when available   Reason for call: Confirm that [place] [name] was informed about letter completion and to schedule a follow-up appointment (Video or In Office)

## 2020-08-09 ENCOUNTER — Other Ambulatory Visit: Payer: Self-pay | Admitting: Nurse Practitioner

## 2020-08-09 NOTE — Progress Notes (Signed)
This encounter was created in error - please disregard.

## 2020-08-15 ENCOUNTER — Other Ambulatory Visit: Payer: Self-pay | Admitting: Nurse Practitioner

## 2020-08-15 ENCOUNTER — Other Ambulatory Visit: Payer: Self-pay

## 2020-08-15 DIAGNOSIS — J440 Chronic obstructive pulmonary disease with acute lower respiratory infection: Secondary | ICD-10-CM

## 2020-08-15 NOTE — Telephone Encounter (Signed)
Spoke with Daughter and she is in [place]. Told me that she didn't know when she would be back. Patient is needing refill on meds. Per NP Janett Billow we need to see patient via MY Chart video or by Telephone visit. We are unable to fill the meds till a visit is done. Last appt was 11/2019.

## 2020-08-15 NOTE — Telephone Encounter (Signed)
[name] (son) called for a refill on Ms. Halbert Ipratropium Albuterol, after speaking with Rodena Piety CMA , I called back spoke to [name] and he stated he did follow up and the Medication will be sent overnight by [facility]. He stated he thought maybe [name] was a little confused when he called.

## 2020-08-15 NOTE — Telephone Encounter (Signed)
I called patients son to inquiry about request for we recently filled/aproved RX for [facility] on 07/24/2020. Virginia [name] said he was not aware that rx was approved for [facility], for he has not received anything from them.  I informed [name] will call [facility] to check the status.  Called [facility] and spoke with [name], per [place] patient or representative needs to call to confirm shipping information before rx can be sent. [facility] attempts to call patient every 3 weeks and it is recorded no answer. Per [name] if patient tries to get at [facility] and from them he will run into billing issues.    I called Virginia [name] back and gave him the number to call [facility].

## 2020-08-15 NOTE — Telephone Encounter (Signed)
Noted, can also refer to refill encounter dated 08/15/2020 where I spoke with [name]

## 2020-08-20 ENCOUNTER — Telehealth: Payer: Self-pay | Admitting: *Deleted

## 2020-08-20 NOTE — Telephone Encounter (Signed)
Called and left a voicemail to schedule a Palliative care home visit. Contact information left for return call.

## 2020-08-23 ENCOUNTER — Telehealth: Payer: Self-pay

## 2020-08-23 NOTE — Telephone Encounter (Addendum)
Spoke with Adolphus Birchwood states patient is bed ridden and it is almost near impossible to get her out of the house. Patient had a caregiver from [place] who was in the home from 9 am- 9 pm on Monday who has tested positive for covid and was in close contact with the patient.  Patient scheduled an appointment for next Friday (08/31/2020) for a routine visit.  Please advise on options for bed ridden patient

## 2020-08-23 NOTE — Telephone Encounter (Signed)
If she is unable to be tested would monitor her closely if she develops symptoms we will assume that it is COVID based on exposure and go from there.

## 2020-08-23 NOTE — Telephone Encounter (Signed)
Incoming call from [place], Therapist, sports with [name]. Per Lattie Haw patient was exposed to covid on Monday and they need guidance.  [name] is encouraging PCR testing and states it is difficult to ambulate and bring patient to office.   Aids that were in the home had surgical mask on.   Per Lattie Haw we need to return call to her and speak with patients son [name] [phone]  Please advise

## 2020-08-23 NOTE — Telephone Encounter (Signed)
Virginia [name] is aware of Jessica's recommendation and states right now his mother appears to be in her normal state, no distress, or symptoms at this time.

## 2020-08-23 NOTE — Telephone Encounter (Signed)
Can schedule COVID testing by visit [email or web address] to wait 3-5 days AFTER exposure  These test sites are drive thru  If she starts developing symptoms to notify office  She is overdue for follow up visit, would also recommend scheduling video visit for follow up

## 2020-08-24 NOTE — Telephone Encounter (Signed)
I tried to call Virginia [name] back to share Jessica's response and the number she provided for me to return the call was not a working number.

## 2020-08-30 ENCOUNTER — Telehealth: Payer: Self-pay | Admitting: *Deleted

## 2020-08-30 NOTE — Telephone Encounter (Signed)
Called and left a voicemail to schedule a palliative care home visit. Left contact information for return call.

## 2020-08-31 ENCOUNTER — Telehealth (INDEPENDENT_AMBULATORY_CARE_PROVIDER_SITE_OTHER): Payer: Medicare Other | Admitting: Nurse Practitioner

## 2020-08-31 ENCOUNTER — Encounter: Payer: Self-pay | Admitting: Nurse Practitioner

## 2020-08-31 ENCOUNTER — Other Ambulatory Visit: Payer: Self-pay

## 2020-08-31 DIAGNOSIS — R197 Diarrhea, unspecified: Secondary | ICD-10-CM | POA: Diagnosis not present

## 2020-08-31 DIAGNOSIS — I10 Essential (primary) hypertension: Secondary | ICD-10-CM | POA: Diagnosis not present

## 2020-08-31 DIAGNOSIS — F419 Anxiety disorder, unspecified: Secondary | ICD-10-CM | POA: Diagnosis not present

## 2020-08-31 DIAGNOSIS — F0391 Unspecified dementia with behavioral disturbance: Secondary | ICD-10-CM

## 2020-08-31 DIAGNOSIS — K219 Gastro-esophageal reflux disease without esophagitis: Secondary | ICD-10-CM | POA: Diagnosis not present

## 2020-08-31 DIAGNOSIS — J449 Chronic obstructive pulmonary disease, unspecified: Secondary | ICD-10-CM

## 2020-08-31 NOTE — Progress Notes (Signed)
Careteam: Patient Care Team: Lauree Chandler, NP as PCP - General (Nurse Practitioner) Fay Records, MD as PCP - Cardiology (Cardiology) Tanda Rockers, MD as Consulting Physician (Pulmonary Disease) Conan Bowens, RN as Registered Nurse [facility] and Palliative Medicine) Hilarie Fredrickson, Lajuan Lines, MD as Consulting Physician (Gastroenterology)  Advanced Directive information Does Patient Have a Medical Advance Directive?: Yes, Type of Advance Directive: [place], Does patient want to make changes to medical advance directive?: No - Patient declined  Allergies  Allergen Reactions  . Lidocaine Anaphylaxis, Swelling, Rash and Other (See Comments)    Any of the " [facility] "  . Other Other (See Comments)    All drugs that end in "cane'-  novacane etc. Daughter states patient almost died when she had it when she was born    Chief Complaint  Patient presents with  . Medical Management of Chronic Issues    Routine follow-up. Discuss clobatesol cream and if patient needs to be using. Discuss need for flu vaccine      HPI: Patient is a 84 y.o. female for routine follow up.  Pt continues to need nebulizer treatment due to chronic cough and congestion. Had COVID exposure of Monday of last week.    Dementia- pt with irritability and agitation and was started on Seroquel 25 mg daily twice daily but makes her sleepy. Sleeping a lot during the day but very combative with care.  She is also taking Depakote sprinkles 125 mg by mouth twice daily-- which she has been on since this summer after psych evaluation- daughter said they wanted to increase medication but never did.  She does not have consistent caregivers which adds to the problem per her daughter.  palliative care is following with her at home.  She also has home health nurse following with her.  PLAN OF CARE from hospital recommendation:  Dementia with behavioral disturbances, unspecified type: -Continue  Depakote 125 mg BID -Continue Seroquel 12.5 mg at 8 am and 2 pm along with 25 mg at bedtime -Continue Namenda 10 mg BID -Continue Aricept 10 mg daily   Daughter reports she has diarrhea for 2 days. Reports it is dark.  She had 2 episodes of yesterday and 1 episode today. Foul odor.  Her son reports she was complaining of stomach issus and they gave her pepto. She could not really give them much detail in regards to the pain. Has miralax daily. Gave this morning.  Eating and drinking well. Has had issues with constipation as well.   Eating less on most days. Bed bound.  Some skin tears noted  Dermatologist in prescribing clobetasol cream- getting blisters occasionally and this is taking care of the issue.   She had shingles at the end of march- sent pictures to dermatologist, did not do well with the antiviral.   She has not been able to get in to any appt, bed bound.   Does not routinely take VS.   Review of Systems: provided by family  Review of Systems  Constitutional: Negative for chills, fever and weight loss.  HENT: Negative for tinnitus.   Respiratory: Positive for cough, sputum production and shortness of breath.   Cardiovascular: Negative for chest pain, palpitations and leg swelling.  Gastrointestinal: Positive for diarrhea. Negative for abdominal pain, constipation and heartburn.  Genitourinary: Negative for dysuria, frequency and urgency.  Musculoskeletal: Positive for myalgias. Negative for back pain, falls and joint pain.  Skin: Negative.   Neurological:  Positive for weakness (bedbound). Negative for dizziness and headaches.  Psychiatric/Behavioral: Positive for memory loss. Negative for depression. The patient is nervous/anxious. The patient does not have insomnia.     Past Medical History:  Diagnosis Date  . Anxiety 07/08/2017  . Arthritis   . Carotid artery disease ([place])    a. s/p L CEA. b. followed by [facility].  . Chronic diastolic CHF (congestive heart failure)  ([place])   . CKD (chronic kidney disease), stage III ([place])   . COPD (chronic obstructive pulmonary disease) ([place])   . COPD GOLD IV 12/30/2014   PFTs 12/15/14 FEV1  0.57 (29%) ratio 54 p 17% resp to saba    . Cough 01/31/2016  . Dementia with behavioral disturbance ([place]) 07/08/2017  . Diverticulosis   . Dyspnea   . Essential hypertension 12/02/2014  . Fall at home Sept. 2013  . Family history of adverse reaction to anesthesia    SON IS SLOW TO WAKE   . Gastroesophageal reflux disease 07/08/2017  . Hyperlipidemia   . Hypertension   . Hyponatremia 12/02/2014  . Hypotension 02/01/2017  . Incontinent of urine   . Internal hemorrhoid   . Intertrochanteric fracture ([place]) 04/20/2019  . Memory disorder 10/24/2014  . Nausea and vomiting 11/30/2014  . Palliative care status   . Primary osteoarthritis involving multiple joints 07/08/2017  . Vitamin D deficiency 02/15/2015   Past Surgical History:  Procedure Laterality Date  . APPENDECTOMY    . BREAST REDUCTION SURGERY    . CAROTID ENDARTERECTOMY     left CEA  . CATARACT EXTRACTION Bilateral   . COLONOSCOPY  2015  . EYE SURGERY     cataracts removed, bilaterally   . INTRAMEDULLARY (IM) NAIL INTERTROCHANTERIC Right 04/21/2019   Procedure: INTRAMEDULLARY (IM) NAIL INTERTROCHANTRIC;  Surgeon: Rod Can, MD;  Location: [facility];  Service: Orthopedics;  Laterality: Right;  . skin cancer removal    . TONSILLECTOMY     Social History:   reports that she quit smoking about 17 months ago. Her smoking use included cigarettes. She has a 5.00 pack-year smoking history. She has never used smokeless tobacco. She reports previous alcohol use. She reports that she does not use drugs.  Family History  Problem Relation Age of Onset  . Heart disease Father        Before age 51  . Hypertension Father   . Heart attack Father   . Cancer Mother 67       Brain  . Dementia Brother   . Cancer Maternal Aunt 61       breast cancer  . Diabetes Maternal Aunt   . High  blood pressure Brother   . Hypothyroidism Daughter   . High blood pressure Son   . High Cholesterol Son   . Diabetes Son   . COPD Son   . Obesity Son   . Heart failure Maternal Grandmother   . Diabetes Maternal Grandmother   . Stroke Neg Hx     Medications: Patient's Medications  New Prescriptions   No medications on file  Previous Medications   ACETAMINOPHEN (TYLENOL) 500 MG TABLET    Take 1,000 mg by mouth in the morning and at bedtime.    ASPIRIN EC 81 MG TABLET    Take 81 mg by mouth in the morning and at bedtime.    CARBINOXAMINE MALEATE 4 MG TABS    Take 1 tablet by mouth in the morning and at bedtime.   CLOBETASOL CREAM (TEMOVATE) 0.05 %  Apply topically 2 (two) times daily.   DICLOFENAC SODIUM (VOLTAREN) 1 % GEL    Apply 2 g topically in the morning and at bedtime.   DIVALPROEX (DEPAKOTE SPRINKLE) 125 MG CAPSULE    Take 125 mg by mouth 2 (two) times daily.   DONEPEZIL (ARICEPT) 10 MG TABLET    Take 1 tablet (10 mg total) by mouth daily.   FUROSEMIDE (LASIX) 20 MG TABLET    TAKE 1 TABLET BY MOUTH ONCE DAILY   IPRATROPIUM-ALBUTEROL (DUONEB) 0.5-2.5 (3) MG/3ML SOLN    USE 1 VIAL IN NEBULIZER EVERY 6 HOURS - and as needed   MEMANTINE (NAMENDA) 10 MG TABLET    TAKE 1 TABLET BY MOUTH TWICE DAILY   ONDANSETRON (ZOFRAN-ODT) 4 MG DISINTEGRATING TABLET    Take 4 mg by mouth as needed for nausea or vomiting.   PANTOPRAZOLE (PROTONIX) 40 MG TABLET    TAKE 1 TABLET(40 MG) BY MOUTH TWICE DAILY   PROPRANOLOL (INDERAL) 20 MG TABLET    TAKE 1 TABLET(20 MG) BY MOUTH TWICE DAILY   QUETIAPINE (SEROQUEL) 25 MG TABLET    Take 25 mg by mouth 2 (two) times daily.   ROSUVASTATIN (CRESTOR) 20 MG TABLET    Take 1 tablet (20 mg total) by mouth daily.   SERTRALINE (ZOLOFT) 50 MG TABLET    Take 2 tablets (100 mg total) by mouth daily.  Modified Medications   No medications on file  Discontinued Medications   CARBINOXAMINE MALEATE 4 MG TABS    Take 1 tablet (4 mg total) by mouth every 8 (eight) hours  as needed.   DIVALPROEX (DEPAKOTE SPRINKLE) 125 MG CAPSULE    Take 1 capsule (125 mg total) by mouth every 12 (twelve) hours. Take 2 capsules every 12 hours   QUETIAPINE (SEROQUEL) 25 MG TABLET    Take 0.5 tablets (12.5 mg total) by mouth 3 (three) times daily. Take 0.5 tablet at 8 am and 2 pm, take 1 tablet at bedtime    Physical Exam:  There were no vitals filed for this visit. There is no height or weight on file to calculate BMI. Wt Readings from Last 3 Encounters:  04/21/19 178 lb (80.7 kg)  12/14/18 190 lb (86.2 kg)  12/07/18 187 lb 1.9 oz (84.9 kg)     Labs reviewed: Basic Metabolic Panel: Recent Labs    11/02/19 1411 06/08/20 1725  NA 144 141  K 4.0 4.1  CL 101 102  CO2 29 31  GLUCOSE 101* 122*  BUN 24 21  CREATININE 0.99 1.03*  CALCIUM 9.1 8.3*  TSH 0.561  --    Liver Function Tests: Recent Labs    11/02/19 1411 02/24/20 1444 06/08/20 1725  AST 16 22 24   ALT 8  --  15  ALKPHOS 74  --  44  BILITOT 0.5  --  0.8  PROT 5.5*  --  5.0*  ALBUMIN 3.6  --  2.7*   No results for input(s): LIPASE, AMYLASE in the last 8760 hours. No results for input(s): AMMONIA in the last 8760 hours. CBC: Recent Labs    11/02/19 1411 06/08/20 1725  WBC 7.5 9.6  NEUTROABS 4.0 5.1  HGB 12.8 12.2  HCT 39.8 40.4  MCV 89 97.8  PLT 240 219   Lipid Panel: Recent Labs    11/02/19 1411 02/24/20 1444  CHOL 336* 182  HDL 42 47  LDLCALC 207* 87  TRIG 417* 294*  CHOLHDL 8.0* 3.9   TSH: Recent Labs  11/02/19 1411  TSH 0.561   A1C: Lab Results  Component Value Date   HGBA1C 5.2 12/10/2017     Assessment/Plan 1. Dementia with behavioral disturbance, unspecified dementia type ([place]) Has had worsening behaviors and required ED visit due to being combative. Recommendation was made to continue aricept and namenda. To start taking Depakote 125 mg BID and take Seroquel 12.5 mg at 8 am and 2 pm along with 25 mg at bedtime  2. Anxiety Stable on zoloft 100 mg daily  3.  COPD GOLD IV continues with cough, without worsening of shortness of breath, cough or congestion.    4. Diarrhea, unspecified type New, still taking miralax, educated to HOLD miralax for diarrhea. May need to start taking every other day. Will need to follow up if diarrhea persist.   5. Gastroesophageal reflux disease without esophagitis Stable, occasional worsening of symptoms but felt to be under good controlled on Protonix   6. Essential hypertension Has not been routinely checking blood pressure at home. Encouraged bp check for virtual visits.  Next appt: 3 months sooner if needed  Albena Comes K. [place], [facility] [phone]    Virtual Visit via my chart connected with patient on 08/31/20 at  2:45 PM EDT by video visit and verified that I am speaking with the correct person using two identifiers.  Location: Patient: home Provider: [place]   I discussed the limitations, risks, security and privacy concerns of performing an evaluation and management service by telephone and the availability of in person appointments. I also discussed with the patient that there may be a patient responsible charge related to this service. The patient expressed understanding and agreed to proceed.   I discussed the assessment and treatment plan with the patient. The patient was provided an opportunity to ask questions and all were answered. The patient agreed with the plan and demonstrated an understanding of the instructions.   The patient was advised to call back or seek an in-person evaluation if the symptoms worsen or if the condition fails to improve as anticipated.  I provided 30  minutes of non-face-to-face time during this encounter.  Carlos American. Harle [facility] [employer] printed and mailed

## 2020-08-31 NOTE — Progress Notes (Signed)
   This service is provided via telemedicine  No vital signs collected/recorded due to the encounter was a telemedicine visit.   Location of patient (ex: home, work):  Home  Patient consents to a telephone visit:  Yes, see telephone encounter dated with annual consent   Location of the provider (ex: office, home): [facility], Office   Name of any referring provider:  n/a  Names of all persons participating in the telemedicine service and their role in the encounter:  S.Chrae B/CMA, Sherrie Mustache, NP, [name] (brother), [name] (son) and Patient

## 2020-09-11 MED ORDER — BUDESONIDE 0.5 MG/2ML IN SUSP
0.5000 mg | Freq: Two times a day (BID) | RESPIRATORY_TRACT | 5 refills | Status: DC
Start: 2020-09-11 — End: 2020-09-14

## 2020-09-11 NOTE — Addendum Note (Signed)
Addended by: Valentina Shaggy on: 09/11/2020 03:40 PM   Modules accepted: Orders

## 2020-09-11 NOTE — Progress Notes (Signed)
Sent in Pulmicort twice daily to use via nebulizer for her breathing issues. I talked to her son on October 12th, 2021.   Salvatore Marvel, MD Allergy and [place] [facility] [place]

## 2020-09-12 ENCOUNTER — Other Ambulatory Visit: Payer: Self-pay | Admitting: Allergy & Immunology

## 2020-09-12 ENCOUNTER — Other Ambulatory Visit: Payer: Self-pay

## 2020-09-12 MED ORDER — FUROSEMIDE 20 MG PO TABS: 20.0000 mg | ORAL_TABLET | Freq: Every day | ORAL | 2 refills | Status: DC

## 2020-09-13 ENCOUNTER — Ambulatory Visit: Payer: Medicare Other | Admitting: Internal Medicine

## 2020-09-14 ENCOUNTER — Other Ambulatory Visit: Payer: Self-pay | Admitting: Allergy & Immunology

## 2020-09-14 ENCOUNTER — Other Ambulatory Visit: Payer: Self-pay

## 2020-09-14 ENCOUNTER — Telehealth: Payer: Self-pay | Admitting: *Deleted

## 2020-09-14 ENCOUNTER — Telehealth: Payer: Self-pay | Admitting: Allergy & Immunology

## 2020-09-14 MED ORDER — BUDESONIDE 0.5 MG/2ML IN SUSP: 0.5000 mg | Freq: Two times a day (BID) | RESPIRATORY_TRACT | 5 refills | Status: DC

## 2020-09-14 MED ORDER — CARBINOXAMINE MALEATE 4 MG PO TABS: 1.0000 | ORAL_TABLET | Freq: Two times a day (BID) | ORAL | 5 refills | Status: DC

## 2020-09-14 NOTE — Telephone Encounter (Signed)
Called and spoke to Mrs. Butch's brother [name] and notified him that the prescriptions have been sent in and will be ready for pick up later today

## 2020-09-14 NOTE — Telephone Encounter (Signed)
Pt hasn't been here in a while. Wanted to make sure you knew about it and its ok

## 2020-09-14 NOTE — Telephone Encounter (Signed)
I thought I sent in Pulmicort a couple days ago.  It is okay to refill both of those.  I talked to her son a couple days ago, as he is a patient of mine, and he talked to me about her.  She is having worsening dementia and it would be difficult for her to get in.  I am okay filling even though I have not seen well.  Salvatore Marvel, MD Allergy and [place] [facility] [place]

## 2020-09-14 NOTE — Telephone Encounter (Signed)
Called and left a voicemail requesting to schedule a palliative care home visit. Contact information left for return call.

## 2020-09-14 NOTE — Telephone Encounter (Signed)
Patient son called and needs to have Pulmicort nebulizer solution , and carbinoxamine called into [facility] on [address].they talk with dr gallagher last week when her son came to be seen. And he said that he would refill. [phone].

## 2020-09-15 ENCOUNTER — Other Ambulatory Visit: Payer: Self-pay | Admitting: Allergy & Immunology

## 2020-09-18 ENCOUNTER — Telehealth: Payer: Self-pay | Admitting: *Deleted

## 2020-09-18 NOTE — Telephone Encounter (Signed)
Received a call back from patient's brother, [name], to schedule a palliative care home visit. Visit scheduled for 10/22@1 :00pm.

## 2020-09-18 NOTE — Telephone Encounter (Signed)
Called stating that she needs a refill for Carbinoxamine sent to [facility] on [address]. It looks like one was sent in on the 15th. However, pt states that the pharmacy is saying otherwise.

## 2020-09-19 ENCOUNTER — Other Ambulatory Visit: Payer: Self-pay

## 2020-09-19 MED ORDER — CARBINOXAMINE MALEATE 4 MG PO TABS: 1.0000 | ORAL_TABLET | Freq: Two times a day (BID) | ORAL | 5 refills | Status: DC

## 2020-09-19 NOTE — Telephone Encounter (Signed)
Patient's husband called back and states he never heard back from the office regarding Carbinoxamine. Pharmacy is still saying they do not have a prescription. Patient is out of medication and needs this as soon as possible.  Please advise.

## 2020-09-19 NOTE — Telephone Encounter (Signed)
Refill sent in and left voicemail to notify patient

## 2020-09-20 ENCOUNTER — Encounter: Payer: Self-pay | Admitting: Physician Assistant

## 2020-09-20 ENCOUNTER — Telehealth (INDEPENDENT_AMBULATORY_CARE_PROVIDER_SITE_OTHER): Payer: Medicare Other | Admitting: Physician Assistant

## 2020-09-20 ENCOUNTER — Other Ambulatory Visit: Payer: Self-pay

## 2020-09-20 VITALS — BP 141/90 | HR 88 | Ht 62.0 in | Wt 178.0 lb

## 2020-09-20 DIAGNOSIS — J449 Chronic obstructive pulmonary disease, unspecified: Secondary | ICD-10-CM

## 2020-09-20 DIAGNOSIS — I739 Peripheral vascular disease, unspecified: Secondary | ICD-10-CM

## 2020-09-20 DIAGNOSIS — I10 Essential (primary) hypertension: Secondary | ICD-10-CM | POA: Diagnosis not present

## 2020-09-20 DIAGNOSIS — R251 Tremor, unspecified: Secondary | ICD-10-CM | POA: Diagnosis not present

## 2020-09-20 DIAGNOSIS — E0590 Thyrotoxicosis, unspecified without thyrotoxic crisis or storm: Secondary | ICD-10-CM

## 2020-09-20 DIAGNOSIS — I5032 Chronic diastolic (congestive) heart failure: Secondary | ICD-10-CM | POA: Diagnosis not present

## 2020-09-20 DIAGNOSIS — E782 Mixed hyperlipidemia: Secondary | ICD-10-CM | POA: Diagnosis not present

## 2020-09-20 MED ORDER — PROPRANOLOL HCL 20 MG PO TABS: 20.0000 mg | ORAL_TABLET | Freq: Two times a day (BID) | ORAL | 1 refills | Status: DC

## 2020-09-20 MED ORDER — FUROSEMIDE 20 MG PO TABS: 20.0000 mg | ORAL_TABLET | Freq: Every day | ORAL | 3 refills | Status: DC

## 2020-09-20 MED ORDER — ROSUVASTATIN CALCIUM 20 MG PO TABS: 20.0000 mg | ORAL_TABLET | Freq: Every day | ORAL | 3 refills | Status: DC

## 2020-09-20 NOTE — Addendum Note (Signed)
Addended by: Gaetano Net on: 09/20/2020 02:48 PM   Modules accepted: Orders

## 2020-09-20 NOTE — Patient Instructions (Signed)
Medication Instructions:  Your physician recommends that you continue on your current medications as directed. Please refer to the Current Medication list given to you today.  *If you need a refill on your cardiac medications before your next appointment, please call your pharmacy*   Lab Work: None ordered  If you have labs (blood work) drawn today and your tests are completely normal, you will receive your results only by: Marland Kitchen MyChart Message (if you have MyChart) OR . A paper copy in the mail If you have any lab test that is abnormal or we need to change your treatment, we will call you to review the results.   Testing/Procedures: None ordered  Follow-Up: At [facility], you and your health needs are our priority.  As part of our continuing mission to provide you with exceptional heart care, we have created designated Provider Care Teams.  These Care Teams include your primary Cardiologist (physician) and Advanced Practice Providers (APPs -  Physician Assistants and Nurse Practitioners) who all work together to provide you with the care you need, when you need it.  We recommend signing up for the patient portal called "MyChart".  Sign up information is provided on this After Visit Summary.  MyChart is used to connect with patients for Virtual Visits (Telemedicine).  Patients are able to view lab/test results, encounter notes, upcoming appointments, etc.  Non-urgent messages can be sent to your provider as well.   To learn more about what you can do with MyChart, go to [email or web address].    Your next appointment:   As Needed

## 2020-09-20 NOTE — Progress Notes (Signed)
Virtual Visit via Telephone Note   This visit type was conducted due to national recommendations for restrictions regarding the COVID-19 Pandemic (e.g. social distancing) in an effort to limit this patient's exposure and mitigate transmission in our community.  Due to her co-morbid illnesses, this patient is at least at moderate risk for complications without adequate follow up.  This format is felt to be most appropriate for this patient at this time.  The patient did not have access to video technology/had technical difficulties with video requiring transitioning to audio format only (telephone).  All issues noted in this document were discussed and addressed.  No physical exam could be performed with this format.  Please refer to the patient's chart for her  consent to telehealth for [facility].    Date:  09/20/2020   ID:  Virginia [name], DOB June 14, 1936, MRN [id number] The patient was identified using 2 identifiers.  Patient Location: Home Provider Location: Home Office  PCP:  Lauree Chandler, NP  Cardiologist:  Dorris Carnes, MD  Electrophysiologist:  None   Evaluation Performed:  Follow-Up Visit  Chief Complaint:  4 months follow up  History of Present Illness:    Virginia [name] is a 84 y.o. female with with chronic diastolic CHF, COPD Gold 4 on home oxygen, hypertension, hyperlipidemia (previous issues with statins), left carotid artery stenosis s/p left CEA, CKD stage III, dementia, anxiety, fall with hip fracture repair in 04/2019 seen for follow up.   She is followed by palliative care and has been residing in her home with help of family and caregiver assistance.  Seen virtually for follow-up.  Patient has advanced dementia and bedridden for a while.  Use bedpan.  No walking with walker.  History obtained from son [name] and brother [name] who lives with patient..  No complaints of chest pain, shortness of breath, palpitation, orthopnea or PND.  Palliative care comes at home  once per month.  Occasional breathing issue improved with use of nebulizer.  The patient does not have symptoms concerning for COVID-19 infection (fever, chills, cough, or new shortness of breath).    Past Medical History:  Diagnosis Date  . Anxiety 07/08/2017  . Arthritis   . Carotid artery disease ([place])    a. s/p L CEA. b. followed by [facility].  . Chronic diastolic CHF (congestive heart failure) ([place])   . CKD (chronic kidney disease), stage III ([place])   . COPD (chronic obstructive pulmonary disease) ([place])   . COPD GOLD IV 12/30/2014   PFTs 12/15/14 FEV1  0.57 (29%) ratio 54 p 17% resp to saba    . Cough 01/31/2016  . Dementia with behavioral disturbance ([place]) 07/08/2017  . Diverticulosis   . Dyspnea   . Essential hypertension 12/02/2014  . Fall at home Sept. 2013  . Family history of adverse reaction to anesthesia    SON IS SLOW TO WAKE   . Gastroesophageal reflux disease 07/08/2017  . Hyperlipidemia   . Hypertension   . Hyponatremia 12/02/2014  . Hypotension 02/01/2017  . Incontinent of urine   . Internal hemorrhoid   . Intertrochanteric fracture ([place]) 04/20/2019  . Memory disorder 10/24/2014  . Nausea and vomiting 11/30/2014  . Palliative care status   . Primary osteoarthritis involving multiple joints 07/08/2017  . Vitamin D deficiency 02/15/2015   Past Surgical History:  Procedure Laterality Date  . APPENDECTOMY    . BREAST REDUCTION SURGERY    . CAROTID ENDARTERECTOMY     left CEA  .  CATARACT EXTRACTION Bilateral   . COLONOSCOPY  2015  . EYE SURGERY     cataracts removed, bilaterally   . INTRAMEDULLARY (IM) NAIL INTERTROCHANTERIC Right 04/21/2019   Procedure: INTRAMEDULLARY (IM) NAIL INTERTROCHANTRIC;  Surgeon: Rod Can, MD;  Location: [facility];  Service: Orthopedics;  Laterality: Right;  . skin cancer removal    . TONSILLECTOMY       Current Meds  Medication Sig  . acetaminophen (TYLENOL) 500 MG tablet Take 1,000 mg by mouth in the morning and at bedtime.   Marland Kitchen aspirin EC 81  MG tablet Take 81 mg by mouth in the morning and at bedtime.   . budesonide (PULMICORT) 0.5 MG/2ML nebulizer solution Take 2 mLs (0.5 mg total) by nebulization 2 (two) times daily.  . Carbinoxamine Maleate 4 MG TABS Take 1 tablet (4 mg total) by mouth in the morning and at bedtime.  . clobetasol cream (TEMOVATE) 0.05 % Apply topically 2 (two) times daily.  . diclofenac Sodium (VOLTAREN) 1 % GEL Apply 2 g topically in the morning and at bedtime.  . divalproex (DEPAKOTE SPRINKLE) 125 MG capsule Take 125 mg by mouth 2 (two) times daily.  Marland Kitchen donepezil (ARICEPT) 10 MG tablet Take 1 tablet (10 mg total) by mouth daily.  . furosemide (LASIX) 20 MG tablet Take 1 tablet (20 mg total) by mouth daily.  Marland Kitchen ipratropium-albuterol (DUONEB) 0.5-2.5 (3) MG/3ML SOLN USE 1 VIAL IN NEBULIZER EVERY 6 HOURS - and as needed  . memantine (NAMENDA) 10 MG tablet TAKE 1 TABLET BY MOUTH TWICE DAILY  . ondansetron (ZOFRAN-ODT) 4 MG disintegrating tablet Take 4 mg by mouth as needed for nausea or vomiting.  . pantoprazole (PROTONIX) 40 MG tablet TAKE 1 TABLET(40 MG) BY MOUTH TWICE DAILY  . propranolol (INDERAL) 20 MG tablet TAKE 1 TABLET(20 MG) BY MOUTH TWICE DAILY  . QUEtiapine (SEROQUEL) 25 MG tablet Take 25 mg by mouth 2 (two) times daily.  . rosuvastatin (CRESTOR) 20 MG tablet Take 1 tablet (20 mg total) by mouth daily.  . sertraline (ZOLOFT) 50 MG tablet Take 2 tablets (100 mg total) by mouth daily.     Allergies:   Lidocaine and Other   Social History   Tobacco Use  . Smoking status: Former Smoker    Packs/day: 0.25    Years: 20.00    Pack years: 5.00    Types: Cigarettes    Quit date: 04/2019    Years since quitting: 1.4  . Smokeless tobacco: Never Used  . Tobacco comment: 4 or 5 cigarettes per day average  Vaping Use  . Vaping Use: Never used  Substance Use Topics  . Alcohol use: Not Currently    Alcohol/week: 0.0 standard drinks  . Drug use: No     Family Hx: The patient's family history includes  COPD in her son; Cancer (age of onset: 13) in her mother; Cancer (age of onset: 60) in her maternal aunt; Dementia in her brother; Diabetes in her maternal aunt, maternal grandmother, and son; Heart attack in her father; Heart disease in her father; Heart failure in her maternal grandmother; High Cholesterol in her son; High blood pressure in her brother and son; Hypertension in her father; Hypothyroidism in her daughter; Obesity in her son. There is no history of Stroke.  ROS:   Please see the history of present illness.    All other systems reviewed and are negative.   Prior CV studies:   The following studies were reviewed today:  Echo 12/2014 -  Left ventricle: The cavity size was normal. Systolic function was  vigorous. The estimated ejection fraction was in the range of 65%  to 70%. Wall motion was normal; there were no regional wall  motion abnormalities. Doppler parameters are consistent with  abnormal left ventricular relaxation (grade 1 diastolic  dysfunction).  - Mitral valve: Calcified annulus.    Labs/Other Tests and Data Reviewed:    EKG:  No ECG reviewed.  Recent Labs: 11/02/2019: TSH 0.561 06/08/2020: ALT 15; BUN 21; Creatinine, Ser 1.03; Hemoglobin 12.2; Platelets 219; Potassium 4.1; Sodium 141   Recent Lipid Panel Lab Results  Component Value Date/Time   CHOL 182 02/24/2020 02:44 PM   TRIG 294 (H) 02/24/2020 02:44 PM   HDL 47 02/24/2020 02:44 PM   CHOLHDL 3.9 02/24/2020 02:44 PM   CHOLHDL 2.9 12/10/2017 01:49 PM   LDLCALC 87 02/24/2020 02:44 PM   LDLCALC 111 (H) 12/10/2017 01:49 PM   LDLDIRECT 142.0 05/24/2015 11:08 AM    Wt Readings from Last 3 Encounters:  09/20/20 178 lb (80.7 kg)  04/21/19 178 lb (80.7 kg)  12/14/18 190 lb (86.2 kg)     Objective:    Vital Signs:  BP (!) 141/90   Pulse 88   Ht 5\' 2"  (1.575 m)   Wt 178 lb (80.7 kg)   LMP  (LMP Unknown)   BMI 32.56 kg/m    VITAL SIGNS:  reviewed  ASSESSMENT & PLAN:    1. Chronic  diastolic heart failure 2. Hypertension 3. Hyperlipidemia 4. COPD on oxygen 5. PVD  Family reports declining in health condition of patient.  Patient is bedridden.  Follows by palliative care.  Seems overall cardiac symptoms stable.  No change in medical therapy.  As needed follow-up advised.  COVID-19 Education: The signs and symptoms of COVID-19 were discussed with the patient and how to seek care for testing (follow up with PCP or arrange E-visit). The importance of social distancing was discussed today.  Time:   Today, I have spent 9  minutes with the patient with telehealth technology discussing the above problems.     Medication Adjustments/Labs and Tests Ordered: Current medicines are reviewed at length with the patient today.  Concerns regarding medicines are outlined above.   Tests Ordered: No orders of the defined types were placed in this encounter.   Medication Changes: No orders of the defined types were placed in this encounter.   Follow Up:  PRN   Jarrett Soho, PA  09/20/2020 2:42 PM    [facility]

## 2020-09-21 ENCOUNTER — Other Ambulatory Visit: Payer: Medicare Other | Admitting: *Deleted

## 2020-09-21 ENCOUNTER — Other Ambulatory Visit: Payer: Self-pay

## 2020-09-21 DIAGNOSIS — Z515 Encounter for palliative care: Secondary | ICD-10-CM

## 2020-09-24 ENCOUNTER — Other Ambulatory Visit: Payer: Medicare Other | Admitting: *Deleted

## 2020-09-24 NOTE — Progress Notes (Signed)
COMMUNITY PALLIATIVE CARE RN NOTE  PATIENT NAME: Virginia [name] DOB: [birth year] MRN: [id number]  PRIMARY CARE PROVIDER: Lauree Chandler, NP  RESPONSIBLE PARTY:  Acct ID - Guarantor Home Phone Work Phone Relationship Acct Type  [id number] [name], [name] [phone]  Self P/F     [address] [place], [place] [address]   Covid-19 Pre-screening Negative  PLAN OF CARE and INTERVENTION:  1. ADVANCE CARE PLANNING/GOALS OF CARE: Goal is for patient to remain in her home. She has a DNR. 2. PATIENT/CAREGIVER EDUCATION: Symptom management, safe transfers, managing behaviors 3. DISEASE STATUS: Met with patient, daughter/[name], brother/David and son/[name] in the home. They continue with 2 hired caregivers from [place] to 9p 7 days/week. Upon arrival, patient is lying in bed asleep. She is aroused with verbal stimulation. She is confused, but able to answer simple yes or no questions. She does become agitated at times during personal care. She is taking Depakote Sprinkles 125 mg twice daily which has helped. She also continues on Zoloft. She is O2 dependent at 2L/min via [place]. She had a slight coughing spell during visit on own secretions and daughter raised her head of bed, which helped. She is taking nebulizer treatments (Albuterol and Budesonide) twice daily. She is total care for all ADLs and incontinent of both bowel and bladder. Her intake is variable from day to day. She will drink a chocolate Glucerna daily. Patient had a virtual Cardiology appointment yesterday. No changes were made to her plan of care other than he will be contacted from this point on an as-needed basis since they are now unable to bring her in for appointments due to her weakened state. He gave refills of her heart medications for the next year per family. Patient was up to the wheelchair yesterday for about 40 minutes. It required 3 person assistance via [facility] lift to get patient safely into the wheelchair. Patient is very sleepy today  and has been complaining of pain in her back and right shoulder. They are giving Tylenol twice daily to help. Recommended that they allow her to rest in bed for today because initially daughter was wanting to get patient up to wash, cut and curl her hair. Patient would not allow me to take her vital signs today. She wanted to be left alone. She has been having re-occurring blisters around her abdomen and bilateral sides. Most blisters that appear are small. She has a moderate sized blister noted to her inner thigh. Caregivers keep pillows between her knees. Her right knee seems to be turn slightly inwards. They keep her legs elevated on pillows. Will continue to monitor.    HISTORY OF PRESENT ILLNESS:  This is a 84 yo female with a diagnosis of dementia. Palliative care team continues to follow patient and visits monthly and PRN. She continues with 2 hired caregivers from [place] to 9p daily.   CODE STATUS: Full code  ADVANCED DIRECTIVES: Y MOST FORM: no PPS: 30%   PHYSICAL EXAM:   LUNGS: congested cough, no respiratory distress noted, O2 on at 2L/min via [place] CARDIAC: Cor RRR EXTREMITIES: Trace edema in right hand (elevated on pillow) SKIN: Exposed skin is dry and intact; see above note regarding blisters  NEURO: Alert to self, confused, generalized weakness, total care    (Duration of visit and documentation 75 minutes)   [name], RN BSN

## 2020-10-02 ENCOUNTER — Other Ambulatory Visit: Payer: Self-pay

## 2020-10-08 ENCOUNTER — Telehealth: Payer: Self-pay | Admitting: *Deleted

## 2020-10-08 NOTE — Telephone Encounter (Signed)
Received a call from patient's daughter [name] stating that the sore behind patient's left ear is worsening and she thinks that it may be infected. She also has 3 reddened areas noted to her bottom that she is concerned about. Palliative care visit scheduled for tomorrow 10/09/20@4pm .

## 2020-10-09 ENCOUNTER — Other Ambulatory Visit: Payer: Self-pay

## 2020-10-09 ENCOUNTER — Other Ambulatory Visit: Payer: Medicare Other | Admitting: *Deleted

## 2020-10-09 DIAGNOSIS — Z515 Encounter for palliative care: Secondary | ICD-10-CM

## 2020-10-11 ENCOUNTER — Telehealth: Payer: Self-pay

## 2020-10-11 DIAGNOSIS — L89319 Pressure ulcer of right buttock, unspecified stage: Secondary | ICD-10-CM

## 2020-10-11 NOTE — Telephone Encounter (Signed)
Patients daughter [name] called to ask if they could get an order for Silver because they have tried every else and they have gotten the hospital bed and the wound nurse has been there to see patient she doesn't want the wound to get any worse than it is she thinks this might help to heal wound   Please Advise

## 2020-10-12 ENCOUNTER — Telehealth: Payer: Self-pay | Admitting: *Deleted

## 2020-10-12 NOTE — Telephone Encounter (Signed)
Patients has a home Health Nurse that visits

## 2020-10-12 NOTE — Telephone Encounter (Signed)
2 wounds on Right Buttock. They would like to know should they continue using zinc and/or should they bandage it since wound care probably wont see today. They would like a wound care specialist. To Joelene Millin

## 2020-10-12 NOTE — Telephone Encounter (Signed)
She would need an evaluation before treatment can be prescribed, can also place referral to wound care.

## 2020-10-12 NOTE — Telephone Encounter (Signed)
Unable to make recommendations since I have not evaluated, would continue recommendations by home health nurse at this time and will send for wound care evaluation. Sometimes these appts can take a while to get into, would recommend in person evaluation in office so we can evaluate and make recommendations for treatment.

## 2020-10-12 NOTE — Telephone Encounter (Signed)
Virginia [name], with [name], called and stated that she just wanted to make you aware that one of their employees tested Positive for Covid treated patient in home.   Patient has had both her Vaccines and masks were worn.   FYI

## 2020-10-12 NOTE — Telephone Encounter (Signed)
Wound care is a speciality that she would be referred to. If we do a video visit I could do a home health referral for nurse to come out and evaluate.

## 2020-10-12 NOTE — Telephone Encounter (Signed)
Please clarify location of wound, I do not see documentation of this wound

## 2020-10-12 NOTE — Telephone Encounter (Signed)
I called and spoke with patients daughter [name] and she said yes they would like a referral to wound care and how fast would they come I responded that we don't know there turn around but I would speak to our referral coordinator and tell her that  If she can get any information on the when she would call them to inform them  and she also wanted to know if they should leave the wound uncovered or should they continue with the zinc until wound care comes

## 2020-10-15 ENCOUNTER — Telehealth: Payer: Self-pay | Admitting: *Deleted

## 2020-10-15 NOTE — Telephone Encounter (Signed)
Would prefer this to be seen in person, can we see who the palliative care nurse practitioner is that is following Virginia [name] to see if she could possibly do a home visit to assess wound and make recommendation

## 2020-10-15 NOTE — Telephone Encounter (Signed)
I spoke with patients daughter [name] and she said that now her mother's wounds are dripping blood I read to her the response that Sherrie Mustache NP had said about patient coming in to be seen for evaluation and daughter replied that the reason that they did the face to face visit on the computer was so that they would not have to have this type of problem because they would have to call an ambulance just to transport their mother to an in office visit she said she would try to call someone else to try and get help for their mother

## 2020-10-15 NOTE — Telephone Encounter (Signed)
Received a call from [place], with Sherrie Mustache NPs office, [facility]. She says that the daughter contacted them stating that patient has wounds on her bottom and one of them was bleeding over the weekend, and she also requesting a wound care nurse. Gay Filler states that in order for Janett Billow to make a referral for wound care, they need to know exactly what the wounds look like. Patient is unable to go to the office as she is bed-bound and requires 2 person assistance. She requested that I make a visit to assess the wounds and give them a call back to give them my assessment. Advised I will reach out to the family to schedule.  I contacted patient's daughter [name]. Visit scheduled for 10/16/20 at 4pm. Daughter may not be present on the visit, as she is supposed to fly back to [place] tomorrow morning for a doctors appointment for herself.

## 2020-10-15 NOTE — Telephone Encounter (Signed)
I called and spoke with the Palliative Nurse and explained the situation and she said she had just been to see the patient last week and the patients daughter sent her a picture over the wekend of the wound where the scab was peeling I told her they called today and said the wound was now bleeding and she said she will call the daughter and schedule an appointment to  visit and access the wound and send report of her observations

## 2020-10-15 NOTE — Telephone Encounter (Signed)
Patients daughter had the nurse Renee Ramus from [facility] health care to say that they had another patient with similar circumstances and their doctor did a virtual visit and was able to place referral for the patient I told her what had been said by Sherrie Mustache and that I was only the medical assistant but I would relay the message to the provider

## 2020-10-15 NOTE — Telephone Encounter (Signed)
Great thank you!

## 2020-10-16 ENCOUNTER — Other Ambulatory Visit: Payer: Self-pay | Admitting: *Deleted

## 2020-10-16 ENCOUNTER — Other Ambulatory Visit: Payer: Medicare Other | Admitting: *Deleted

## 2020-10-16 ENCOUNTER — Other Ambulatory Visit: Payer: Self-pay

## 2020-10-16 DIAGNOSIS — F419 Anxiety disorder, unspecified: Secondary | ICD-10-CM

## 2020-10-16 DIAGNOSIS — Z515 Encounter for palliative care: Secondary | ICD-10-CM

## 2020-10-16 MED ORDER — SERTRALINE HCL 50 MG PO TABS: 100.0000 mg | ORAL_TABLET | Freq: Every day | ORAL | 5 refills | Status: DC

## 2020-11-05 NOTE — Progress Notes (Signed)
COMMUNITY PALLIATIVE CARE RN NOTE  PATIENT NAME: Virginia [name] DOB: [birth year] MRN: [id number]  PRIMARY CARE PROVIDER: Lauree Chandler, NP  RESPONSIBLE PARTY:[name] Catha Gosselin Acct ID - Guarantor Home Phone Work Phone Relationship Acct Type  [id number] DEBERA, STERBA* [phone]  Self P/F     [address] [place], [place] [address]   Covid-19 Pre-screening Negative  PLAN OF CARE and INTERVENTION:  1. ADVANCE CARE PLANNING/GOALS OF CARE: Goal is for patient to remain in her home with hired caregivers. She is a Full code. 2. PATIENT/CAREGIVER EDUCATION: Symptom management, wound management 3. DISEASE STATUS: PRN RN visit made per PCPs request to assess patient's bottom. Daughter is requesting from PCP a wound care consult, but the office needs additional information before consult can be made. Over the weekend, caregivers assisted patient in sitting up on the side of the bed per daughter's request. Afterwards, she noticed there was an area on her bottom that was bleeding. It is a quarter-sized area to the upper part of her right buttocks. No bleeding or drainage noted on my assessment today. It appears that there is a thin layer of skin missing at the surface, but the area is not open (Stage 2 pressure injury). It is also a dull red, almost dark pink in color and looks better than it did when I assessed it last week. They started applying End It cream Monday night. I feel that they should just continue to apply this cream daily and continue to monitor. I do not feel that this wound warrants a wound nurse at this time. I made the brother, son and daughter aware of my recommendation and they are ok with this. I feel that the bleeding may have occurred d/t possible shearing in the process of the caregivers sitting her up on the side of the bed. Patient is total care and requires 2 person assistance. I have sent a communication to Sherrie Mustache NP providing this update and recommendation. Will continue  to monitor.    HISTORY OF PRESENT ILLNESS: This is a 84 yo female with a diagnosis of dementia. Palliative care team continues to follow patient and visits monthly and PRN. She continues with 2 hired caregivers from [place] to 9p daily.   CODE STATUS: Full code  ADVANCED DIRECTIVES: Y MOST FORM: no PPS: 30%   (Duration of visit and documentation 45 minutes)   [name], RN BSN

## 2020-11-08 ENCOUNTER — Encounter: Payer: Self-pay | Admitting: Neurology

## 2020-11-08 ENCOUNTER — Telehealth (INDEPENDENT_AMBULATORY_CARE_PROVIDER_SITE_OTHER): Payer: Medicare Other | Admitting: Neurology

## 2020-11-08 DIAGNOSIS — F0391 Unspecified dementia with behavioral disturbance: Secondary | ICD-10-CM

## 2020-11-08 MED ORDER — QUETIAPINE FUMARATE 25 MG PO TABS: 25.0000 mg | ORAL_TABLET | Freq: Every day | ORAL | 1 refills | Status: DC

## 2020-11-08 MED ORDER — DONEPEZIL HCL 10 MG PO TABS: 10.0000 mg | ORAL_TABLET | Freq: Every day | ORAL | 1 refills | Status: DC

## 2020-11-08 MED ORDER — MEMANTINE HCL 10 MG PO TABS: 10.0000 mg | ORAL_TABLET | Freq: Two times a day (BID) | ORAL | 1 refills | Status: DC

## 2020-11-08 NOTE — Progress Notes (Signed)
I have read the note, and I agree with the clinical assessment and plan.  Masayuki Sakai K Elnore Cosens   

## 2020-11-08 NOTE — Progress Notes (Signed)
COMMUNITY PALLIATIVE CARE RN NOTE  PATIENT NAME: Virginia [name] DOB: [birth year] MRN: [id number]  PRIMARY CARE PROVIDER: Lauree Chandler, NP  RESPONSIBLE PARTY: [name] (daughter) Acct ID - Guarantor Home Phone Work Phone Relationship Acct Type  [id number] KATIELYNN, HORAN* [phone]  Self P/F     [address], [place], [place] [address]   Covid-19 Pre-screening Negative  PLAN OF CARE and INTERVENTION:  1. ADVANCE CARE PLANNING/GOALS OF CARE: Goal is for patient to remain in her home with hired caregivers. She is a Full code. 2. PATIENT/CAREGIVER EDUCATION: Symptom management 3. DISEASE STATUS: Met with patient, daughter [name], son [name] and brother [name], in the home. Daughter states that patient appears to be experiencing some teeth pain. She notices patient grimacing when she chews. Patient is unable to travel outside of the home for this to be assessed. Recommended making sure her food is soft and meats ground up well. Her intake continues to deteriorate. She mainly only takes in bites and sips. She sometimes refuses to take her medications, and must be re-approached several times. Medications are given crushed in applesauce. Occasional non productive cough. She does cough with thin liquids. She also has decreased urinary output, but she is not drinking well. During assessment, patient has her eyes closed lying in bed, but is easily aroused. She is able to answer simple questions with 1-3 word responses. She continues to have moments of agitation and combative behaviors, which have improved since taking Depakote twice daily. She is total care for all ADLs. She is incontinent of both bowel and bladder. She has a Stage 2 noted to her bottom. No drainage noted. Caregivers are applying barrier cream after each incontinent episode. She has an air mattress. She has a sore behind her left ear that is being treated with Neosporin twice daily after being washed with warm soap and water. A gauze is  being applied as well. She does have padding on her oxygen tubing for behind her ears and cheeks. She remains on oxygen at 2L/min continuously. She continues to sleep throughout much of the day and during the night. Will continue to monitor.   HISTORY OF PRESENT ILLNESS:  This is a 84 yo female with a diagnosis of dementia. Palliative care team continues to follow patient and visits monthly and PRN. She continues with 2 hired caregivers from [place] to 9p daily.  CODE STATUS: Full code ADVANCED DIRECTIVES: Y MOST FORM: no PPS: 30%   PHYSICAL EXAM:   LUNGS: Oxygen on, no resp distress, occasional cough CARDIAC: Cor RRR EXTREMITIES: No edema SKIN: Skin irritation with clear drainage behind left ear, Stage 2 to buttocks  NEURO: Alert and oriented to self, intermittent confusion, flat affect, increased generalized weakness, bed bound   (Duration of visit and documentation 90 minutes)   [name], RN BSN

## 2020-11-08 NOTE — Progress Notes (Signed)
Virtual Visit via Video Note  I connected with Virginia [name] on 11/08/20 at  2:15 PM EST by a video enabled telemedicine application and verified that I am speaking with the correct person using two identifiers.  Location: Patient: at her home  Provider: in the office    I discussed the limitations of evaluation and management by telemedicine and the availability of in person appointments. The patient expressed understanding and agreed to proceed.  History of Present Illness: 11/08/2020 SS: Virginia [name] is an 84 year old female with history of memory disturbance.  She lives at home, has in-home caregivers, 2 aides together.  She has had agitation and aggression.  We have tried high-dose Zoloft, Risperdal, Xanax, and Seroquel.  Currently doing well on Seroquel 25 mg daily.  Mood and agitation is much improved.  She is bedbound, is on continuous oxygen.  She has to be fed, is total care.  She has an air bed, reportedly a sacral ulcer, has home health who manages this.  Reportedly is improving.  She sleeps a lot, no reported hallucinations.  Her appetite is fair.  She is almost nonverbal.  Has a hospice nurse who comes out twice a month.  She is currently sleeping.  [name], and brother Virginia [name] are present for today's visit.  05/07/2020 SS:[name] 84 year old female with history of memory disturbance.  She remains on Aricept and Namenda.  She has in-home caregivers.  She has had agitation and aggression.  We have tried a higher dose of Zoloft, Risperdal, Xanax, recently tried Seroquel.  So far, Seroquel 25 mg at bedtime has been helpful for her agitation and aggression.  She still has times when this may occur when she is especially tired, or if she just doesn't want to be bothered.  There has been significant improvement however.  She wears continuous oxygen, is bed-bound.  Has been dealing with pneumonia, does breathing treatments. Has palliative care coming to the home.  Presents today for  evaluation virtually accompanied by [name], and daughter Virginia [name]. Also talked with caregivers who identify positive improvement with medications.   Observations/Objective: She is sleeping History of provided by her son and her broth, Virginia [name]  Could not perform exam  Assessment and Plan: 1.  Dementia  She has a significant memory impairment.  She is bedbound, has home health services.  We will continue current medications: Seroquel 25 mg at bedtime, Zoloft 100 mg daily, Aricept 10 mg daily, and Namenda 10 mg twice a day.  Consider elimination of Aricept in the future, no way to weigh her but appetite is declining.  She has a hospice nurse that comes out.  The family wishes to continue to follow with [place], if mood issues arrive.  They have no way to bring her into the office.  Follow Up Instructions: 6 months 05/15/2021 2:15   I discussed the assessment and treatment plan with the patient. The patient was provided an opportunity to ask questions and all were answered. The patient agreed with the plan and demonstrated an understanding of the instructions.   The patient was advised to call back or seek an in-person evaluation if the symptoms worsen or if the condition fails to improve as anticipated.  I spent 20 minutes of face-to-face and non-face-to-face time with patient.  This included previsit chart review, lab review, study review, order entry, electronic health record documentation, patient education.    Virginia [name], Virginia Jean, DNP  [facility] [address]  [place], [place] [address] [phone]

## 2020-12-10 ENCOUNTER — Other Ambulatory Visit: Payer: Self-pay | Admitting: Nurse Practitioner

## 2020-12-10 NOTE — Telephone Encounter (Signed)
RX last filled on 12/9/202 by another provider for number 90 with 1 refill, rx also has different instructions

## 2020-12-12 ENCOUNTER — Other Ambulatory Visit: Payer: Self-pay | Admitting: Neurology

## 2020-12-12 NOTE — Telephone Encounter (Signed)
aughter has called for a refill on donepezil (ARICEPT) 10 MG tablet to [facility] [phone] , daughter is requesting a 3 month supply.

## 2020-12-12 NOTE — Telephone Encounter (Signed)
I reached out to her primary care provider, the patient already has palliative care in place at home.  She is no longer able to come to office appointments.  Virtual visits are usually with the family. Her primary NP has arranged for a palliative care NP to see her.  I will refill the Depakote for 1 month, but will have palliative care assume refills going forward.  I am not clear that I need to routinely see the patient, if palliative care is able to see her in the home. Verify pharmacy and can send for 1 month.

## 2020-12-12 NOTE — Telephone Encounter (Signed)
I called pharmacy as we had just done prescription for both donepezil and memantine. Pt is upto date on these per pharmacy.  I called daughter.  Pt was seen in the  [place] at [facility] 05/2020.  Was placed on divalproex sprinkles.  Daughter has been giving her 125mg  po bid (although she stated they ordered 250mg  po bid).  This has definitely helped her behavior and want to keep on this.  I relayed no mention of this in last note with SS/NP when son and brother where at [address] with pt.  (dorothy was not able to be there and takes care of most of the medications).  Are yo ok to keep ing prescribing.  Pt cannot go to appts and will not be able to take her to behavioral health.  Please advise.

## 2020-12-13 MED ORDER — DIVALPROEX SODIUM 125 MG PO CSDR: 125.0000 mg | DELAYED_RELEASE_CAPSULE | Freq: Two times a day (BID) | ORAL | 0 refills | Status: DC

## 2020-12-13 NOTE — Telephone Encounter (Addendum)
I called Virginia [name] and LMVM relayed the POC, will fill for one month divalproex 125mg  po bid [facility] as listed. Palliative NP to resume refills.  No need for f/u with [place]. She is welcome to call back if questions.

## 2020-12-13 NOTE — Addendum Note (Signed)
Addended by: Brandon Melnick on: 12/13/2020 08:35 AM   Modules accepted: Orders

## 2020-12-20 ENCOUNTER — Other Ambulatory Visit: Payer: Self-pay

## 2020-12-20 ENCOUNTER — Other Ambulatory Visit: Payer: Medicare Other | Admitting: *Deleted

## 2020-12-20 ENCOUNTER — Other Ambulatory Visit: Payer: Medicare Other | Admitting: Nurse Practitioner

## 2020-12-20 VITALS — HR 61 | Temp 98.4°F | Resp 16

## 2020-12-20 DIAGNOSIS — Z515 Encounter for palliative care: Secondary | ICD-10-CM

## 2020-12-20 DIAGNOSIS — R1032 Left lower quadrant pain: Secondary | ICD-10-CM | POA: Diagnosis not present

## 2020-12-20 NOTE — Progress Notes (Addendum)
[facility] Pall[facility] Consult Note Telephone: [phone]  Fax: [phone]  PATIENT NAME: Virginia [name] [address] [place] [address] [phone] (home)  DOB: 1936-11-27 MRN: [id number]  PRIMARY CARE PROVIDER:    Lauree Chandler, NP,  [place] [address] [phone]  REFERRING PROVIDER:   Lauree Chandler, NP [place],  [place] [address] [phone]  RESPONSIBLE PARTY:   Extended Emergency Contact Information Primary Emergency Contact: Adcock,David Address: [address]          [place], [place] [address] Johnnette Litter of [place] Phone: [phone] Mobile Phone: [phone] Relation: Brother Secondary Emergency Contact: Eliseo Gum [place] Phone: [phone] Work Phone: [phone] Mobile Phone: [phone] Relation: Daughter  I met face to face with patient and family in home. Daughter [name] present at visit.   ASSESSMENT AND RECOMMENDATIONS:   Symptom Management:  Dementia: Agitation related to dementia. She was discharged on Depakote Sprinkles 174m BID during last hospitalization in July 2021. Family and caregivers report improvement in behavior with Depakote, report patient more cooperative during provision of her personal care. Patient tolerating Depakote without evidence of toxicity, no report of nausea, or vomiting. Left lower quadrant abdominal pain: Family report symptoms ongoing in the last month. Area is tender to light and deep palpitation, +ve bowel sound noted. Care givers report last BM to be 4 days ago, family unsure if patient had BM in the last 2 days. No report of fever, chills, SOB, or abdominal distention. No rash noted on skin in area. Family does not want to take patient to ED for evaluation of abdominal pain due to fear of contracting COVID-19 infection. Pain managed on Tylenol 10071mtwice  daily. Recommendation: Recommend increasing Miralax 17g by mouth administration to twice a day, family advised to give morning and evening until patient has diarrhea then to resumed prior dose of once daily. Consider ordering in home diagnostics (abdominla x-ray to rule out diverticulitis) and UA to rule out urinary tract infection. Visit note faxed to her PCP JeSherrie MustacheNP.  Follow up Palliative Care Visit: Palliative care will continue to follow for complex decision making and symptoms management. Return as needed.  Family /Caregiver/Community Supports: Patient lives at home, daughter DoEarlie Servers her main caregiver. She receives personal care services from [facility] / Functional decline: Patient awake, confused, uncooperative with exam. She is total care for all her ADLs including feeding, requires 2 persons assistance with turning and reporitioning. Patient is bed bound, uses hoyer lift for transfers. Has hospital bed and air mattress.  I spent 40 minutes providing this consultation. More than 50% of the time in this consultation was spent counseling and coordinating communication.   CHIEF COMPLAINT: left lower quadrant abdominal pain  History obtained from review of EMR, discussion with primary team, and interview with family, and caregiver. Records reviewed and summarized bellow.  HISTORY OF PRESENT ILLNESS:  HeINESSA WARDROPs a [age].o. year old female with multiple medical problems including Dementia (FAST 7c) with behavior concerns. Patient is followed by Neurology, behavior is currently managed on Depakote Sprinkles 12522mID. Palliative Care provider was asked to evaluate patient following ongoing treatment of behavior with Depakote, request was made by her PCP EubDewaine OatsesCarlos AmericanP.  CODE STATUS: Full code  PPS: 30%  HOSPICE ELIGIBILITY/DIAGNOSIS: TBD  PHYSICAL EXAM / ROS:  Vital signs:  T 98.4, P 61, RR 16, 97% on room air, decline BP check Current and past weights:  No recent weight reported General: frail appearing, lying in bed in NAD Cardiovascular: no chest pain reported, trace edema  Pulmonary: no cough, no increased SOB GI:  appetite fair, endorses constipation, incontinent of bowel, endorsed abdominal pain GU: no report of dysuria, incontinent of urine MSK:  Limited ROM to right shoulder, non-ambulatory Skin: no wounds reported, scabbed blister noted to left lower back Neurological: Weakness, confused Psych: agitation with activity observed  PAST MEDICAL HISTORY:  Past Medical History:  Diagnosis Date   Anxiety 07/08/2017   Arthritis    Carotid artery disease ([place])    a. s/p L CEA. b. followed by [facility].   Chronic diastolic CHF (congestive heart failure) ([place])    CKD (chronic kidney disease), stage III ([place])    COPD (chronic obstructive pulmonary disease) ([place])    COPD GOLD IV 12/30/2014   PFTs 12/15/14 FEV1  0.57 (29%) ratio 54 p 17% resp to saba     Cough 01/31/2016   Dementia with behavioral disturbance ([place]) 07/08/2017   Diverticulosis    Dyspnea    Essential hypertension 12/02/2014   Fall at home Sept. 2013   Family history of adverse reaction to anesthesia    SON IS SLOW TO WAKE    Gastroesophageal reflux disease 07/08/2017   Hyperlipidemia    Hypertension    Hyponatremia 12/02/2014   Hypotension 02/01/2017   Incontinent of urine    Internal hemorrhoid    Intertrochanteric fracture ([place]) 04/20/2019   Memory disorder 10/24/2014   Nausea and vomiting 11/30/2014   Palliative care status    Primary osteoarthritis involving multiple joints 07/08/2017   Vitamin D deficiency 02/15/2015    SOCIAL HX:  Social History   Tobacco Use   Smoking status: Former Smoker    Packs/day: 0.25    Years: 20.00    Pack years: 5.00    Types: Cigarettes    Quit date: 04/2019    Years since quitting: 1.7   Smokeless tobacco: Never Used   Tobacco comment: 4 or 5 cigarettes per day average  Substance Use Topics   Alcohol use:  Not Currently    Alcohol/week: 0.0 standard drinks   FAMILY HX:  Family History  Problem Relation Age of Onset   Heart disease Father        Before age 89   Hypertension Father    Heart attack Father    Cancer Mother 33       Brain   Dementia Brother    Cancer Maternal Aunt 61       breast cancer   Diabetes Maternal Aunt    High blood pressure Brother    Hypothyroidism Daughter    High blood pressure Son    High Cholesterol Son    Diabetes Son    COPD Son    Obesity Son    Heart failure Maternal Grandmother    Diabetes Maternal Grandmother    Stroke Neg Hx     ALLERGIES:  Allergies  Allergen Reactions   Lidocaine Anaphylaxis, Swelling, Rash and Other (See Comments)    Any of the " Caine  Family "   Other Other (See Comments)    All drugs that end in "cane'-  novacane etc. Daughter states patient almost died when she had it when she was born     PERTINENT MEDICATIONS:  Outpatient Encounter Medications as of 12/20/2020  Medication Sig  acetaminophen (TYLENOL) 500 MG tablet Take 1,000 mg by mouth in the morning and at bedtime.    aspirin EC 81 MG tablet Take 81 mg by mouth in the morning and at bedtime.    budesonide (PULMICORT) 0.5 MG/2ML nebulizer solution Take 2 mLs (0.5 mg total) by nebulization 2 (two) times daily.   Carbinoxamine Maleate 4 MG TABS Take 1 tablet (4 mg total) by mouth in the morning and at bedtime.   clobetasol cream (TEMOVATE) 0.05 % Apply topically 2 (two) times daily.   diclofenac Sodium (VOLTAREN) 1 % GEL Apply 2 g topically in the morning and at bedtime.   divalproex (DEPAKOTE SPRINKLE) 125 MG capsule Take 1 capsule (125 mg total) by mouth 2 (two) times daily.   donepezil (ARICEPT) 10 MG tablet Take 1 tablet (10 mg total) by mouth daily.   furosemide (LASIX) 20 MG tablet Take 1 tablet (20 mg total) by mouth daily.   ipratropium-albuterol (DUONEB) 0.5-2.5 (3) MG/3ML SOLN USE 1 VIAL IN NEBULIZER EVERY 6 HOURS - and as  needed   memantine (NAMENDA) 10 MG tablet Take 1 tablet (10 mg total) by mouth 2 (two) times daily.   ondansetron (ZOFRAN-ODT) 4 MG disintegrating tablet Take 4 mg by mouth as needed for nausea or vomiting.   pantoprazole (PROTONIX) 40 MG tablet TAKE 1 TABLET(40 MG) BY MOUTH TWICE DAILY   propranolol (INDERAL) 20 MG tablet Take 1 tablet (20 mg total) by mouth 2 (two) times daily.   QUEtiapine (SEROQUEL) 25 MG tablet Take 1 tablet (25 mg total) by mouth at bedtime.   rosuvastatin (CRESTOR) 20 MG tablet Take 1 tablet (20 mg total) by mouth daily.   sertraline (ZOLOFT) 50 MG tablet Take 2 tablets (100 mg total) by mouth daily.   No facility-administered encounter medications on file as of 12/20/2020.    Thank you for the opportunity to participate in the care of Ms. Genia Harold. The palliative care team will continue to follow. Please call our office at [phone] if we can be of additional assistance.  Jari Favre, DNP, AGPCNP-BC

## 2020-12-21 ENCOUNTER — Encounter: Payer: Self-pay | Admitting: Nurse Practitioner

## 2020-12-21 NOTE — Telephone Encounter (Signed)
Palliative care would need to place these orders since they saw her.

## 2020-12-21 NOTE — Telephone Encounter (Signed)
Called and left message on vm for dorothy with Sherrie Mustache, NP response.

## 2020-12-21 NOTE — Telephone Encounter (Signed)
Called and left another message on Virginia [name]'s vm about xray machine orders and Sherrie Mustache, NP response.

## 2020-12-21 NOTE — Telephone Encounter (Signed)
[name] called and wanted someone to call her back this afternoon about her mother Virginia [name]. And she said the [facility] nurse came yesterday and said they would put in an order for an X-Ray machine to be brought to the home since the patient can not leave. So she was checking to see if the order was placed or if Eubanks needed to sign off on it.  Call [name] back this afternoon please at [phone].

## 2021-01-01 NOTE — Progress Notes (Signed)
COMMUNITY PALLIATIVE CARE RN NOTE  PATIENT NAME: Virginia [name] DOB: 07-13-1936 MRN: [id number]  PRIMARY CARE PROVIDER: Lauree Chandler, NP  RESPONSIBLE PARTY: [name] (daughter) Acct ID - Guarantor Home Phone Work Phone Relationship Acct Type  [id number] KATNISS, WEEDMAN* [phone]  Self P/F     [address] [place], [place] [address]   Covid-19 Pre-screening Negative  PLAN OF CARE and INTERVENTION:  1. ADVANCE CARE PLANNING/GOALS OF CARE: Goal is for patient to remain in her home and avoid hospitalizations. She is a Full code. 2. PATIENT/CAREGIVER EDUCATION: Symptom management, pain management, safe transfers 3. DISEASE STATUS: Joint visit made with Palliative care NP, Jari Favre as requested by PCP. Met with patient, 2 hired caregivers and daughter in patient's home. Upon arrival, patient is lying in bed awake and alert. She is intermittently confused. She is uncooperative during assessment, however behaviors have improved since the addition of Depakote Sprinkles 125 mg po BID. Patient is experiencing some abdominal pain in her left upper quadrant, exacerbated with palpation. Palliative NP says she will recommend a mobile abdominal x-ray if possible, as unfortunately this is something that palliative care is unable to order. She is also having issues with constipation, which may be the cause of patient's pain. They are unsure of patient's last BM. They are giving patient Miralax daily and NP suggested increasing this to twice daily. She also has pain in her right shoulder with limited ROM. They are applying a topical analgesic to help. Patient remains total care with all ADLs, including feeding. Caregivers report that they were able to get patient up with 2 person assistance about a week ago. She then slept most of the time for the next couple of days. Her intake is variable. They report that she ate good today. She does have some coughing during meals. She takes her medication  crushed in applesauce. She is oxygen dependent at 2L/min via [place]. She continues with her nebulizer treatments twice daily. Daughter states that patient has blisters that come and go, usually on her bottom, sides and sacrum. She says this is a result of patient's Bullous Pemphigoid. She has a scabbed blister noted to her left lower back. Her bottom is unremarkable for any discoloration or wounds. They are using Endit cream after each incontinent episode. She continues on an air mattress. Her right knee is starting to turn inward, but a pillow is placed between patient's legs to help. Will continue to monitor.  HISTORY OF PRESENT ILLNESS: This is a 85 yo female with a diagnosis of dementia. Palliative care team continues to follow patient and visits monthly and PRN. She continues with 2 hired caregivers from [place] to 9p daily.   CODE STATUS: Full code  ADVANCED DIRECTIVES: Y MOST FORM: no PPS: 30%   PHYSICAL EXAM:   VITALS: Today's Vitals   12/20/20 1336  Pulse: 61  Resp: 16  Temp: 98.4 F (36.9 C)  TempSrc: Temporal  SpO2: 97%  PainSc: 4   PainLoc: Abdomen    LUNGS: clear to auscultation  CARDIAC: Cor RRR EXTREMITIES: No edema SKIN: Scabbed blister noted to left lower back  NEURO: Awake and alert, confused, uncooperative at times, generalized weakness, bedbound and total care   (Duration of visit and documentation 75 minutes)   [name], RN BSN

## 2021-01-16 ENCOUNTER — Telehealth: Payer: Self-pay | Admitting: *Deleted

## 2021-01-16 NOTE — Telephone Encounter (Signed)
Communication sent to patient's daughter, [name], to schedule a palliative care home visit. Visit scheduled for 01/22/21@4p .

## 2021-01-20 ENCOUNTER — Other Ambulatory Visit: Payer: Self-pay | Admitting: Nurse Practitioner

## 2021-01-22 ENCOUNTER — Other Ambulatory Visit: Payer: Self-pay

## 2021-01-22 ENCOUNTER — Other Ambulatory Visit: Payer: Medicare Other | Admitting: *Deleted

## 2021-01-22 DIAGNOSIS — Z515 Encounter for palliative care: Secondary | ICD-10-CM

## 2021-01-22 NOTE — Progress Notes (Signed)
COMMUNITY PALLIATIVE CARE RN NOTE  PATIENT NAME: Virginia [name] DOB: 07/26/1936 MRN: [id number]  PRIMARY CARE PROVIDER: Eubanks, Jessica K, NP  RESPONSIBLE PARTY: Virginia [name] (daughter) Acct ID - Guarantor Home Phone Work Phone Relationship Acct Type  [id number] - Hollifield,Jaine* [phone]  Self P/F     [address], [place], [place] [address]   Covid-19 Pre-screening Negative  PLAN OF CARE and INTERVENTION:  1. ADVANCE CARE PLANNING/GOALS OF CARE: Goal is for patient to remain at home and avoid hospitalizations. She is a Full code. 2. PATIENT/CAREGIVER EDUCATION: Symptom management, disease progression, dementia education 3. DISEASE STATUS: Met with patient, daughter Virginia, and brother David in patient's home. Upon arrival, patient is lying in bed awake and alert. Hired caregivers are providing personal care. She continues to be combative (e.g hitting, scratching) during personal care, but behaviors are milder since taking Depakote Sprinkles 125 mg twice daily. Virginia states that patient is not eating well. They do offer foods and fluids often throughout the day but she does refuse at times. Some days she is taking medications later in the day also d/t refusals. They do reapproach. Her medications are given crushed in applesauce. During visit, she did eat 2 bites of chicken salad and drank about 4 oz of sweet tea. She is drinking Premier protein for nutritional supplementation. Bowel movements have been more regular. Miralax is given daily. She has not complained of any abdominal pain recently. She remains total care with all ADLs and has 2 caregivers for personal care. No skin breakdown noted on her bottom. They continue to apply Endit cream and Zinc oxide after each incontinent episode. She does have a small blister noted to her left upper thigh underneath her abdominal fold. Daughter applying Clobetasol cream to dry it up as instructed by her Dermatologist, per daughter. She says the small  blisters do appear often from in various areas due to her bullous pemphigoid. She has a skin tear noted to her left forearm where patient scratched herself. Current dressing is dry and intact.  She has an air mattress and is turned and repositioned often. She continues on oxygen @ 2L min. She does have an occasional cough. They do give liquid Mucinex when needed and patient is agreeable. She continues with nebulizers twice daily. Will continue to monitor.   HISTORY OF PRESENT ILLNESS: This is a 84 yo female with a diagnosis of dementia. She has a hx of essential hypertension, CHF, COPD, GERD, CKD stage III and anxiety. Palliative care team continues to follow patient and visits monthly and PRN. She continues with 2 hired caregivers from 9a to 9p daily.    CODE STATUS: Full code ADVANCED DIRECTIVES: Y MOST FORM: no PPS: 30%   PHYSICAL EXAM:    LUNGS: clear to auscultation  CARDIAC: Cor RRR EXTREMITIES: No edema SKIN: See above note  NEURO: Alert and oriented to self, combative at times, able to answer simple questions, generalized weakness, bedbound    (Duration of visit and documentation 75 minutes)    , RN BSN 

## 2021-01-23 ENCOUNTER — Other Ambulatory Visit: Payer: Self-pay | Admitting: Neurology

## 2021-01-23 ENCOUNTER — Other Ambulatory Visit: Payer: Self-pay | Admitting: Physical Medicine and Rehabilitation

## 2021-01-23 ENCOUNTER — Telehealth: Payer: Self-pay | Admitting: Neurology

## 2021-01-23 DIAGNOSIS — S32030D Wedge compression fracture of third lumbar vertebra, subsequent encounter for fracture with routine healing: Secondary | ICD-10-CM

## 2021-01-23 DIAGNOSIS — M47816 Spondylosis without myelopathy or radiculopathy, lumbar region: Secondary | ICD-10-CM

## 2021-01-23 MED ORDER — DIVALPROEX SODIUM 125 MG PO CSDR: 125.0000 mg | DELAYED_RELEASE_CAPSULE | Freq: Two times a day (BID) | ORAL | 5 refills | Status: DC

## 2021-01-23 NOTE — Telephone Encounter (Signed)
[email or web address] Pallitive Care states pt's daughter asked that she call for a refill on pt's divalproex (DEPAKOTE SPRINKLE) 125 MG capsule because she is down to 5 days worth please send to [facility] [phone]

## 2021-01-23 NOTE — Telephone Encounter (Signed)
Refill sent for the patient.  

## 2021-02-18 ENCOUNTER — Telehealth: Payer: Self-pay | Admitting: *Deleted

## 2021-02-18 NOTE — Telephone Encounter (Signed)
Called and spoke with patient's brother, [name], to schedule a palliative care home visit. Visit scheduled for 3/24@1p .

## 2021-02-19 ENCOUNTER — Telehealth: Payer: Self-pay

## 2021-02-19 ENCOUNTER — Other Ambulatory Visit: Payer: Self-pay | Admitting: Allergy & Immunology

## 2021-02-19 NOTE — Telephone Encounter (Signed)
Called and left a message for patient and family informing them that Dr. Ernst Bowler would like to schedule a telephone visit appointment for patient so that she could continue receiving her medication refills for her allergies.

## 2021-02-21 ENCOUNTER — Other Ambulatory Visit: Payer: Self-pay

## 2021-02-21 ENCOUNTER — Other Ambulatory Visit: Payer: Medicare Other | Admitting: *Deleted

## 2021-02-21 DIAGNOSIS — Z515 Encounter for palliative care: Secondary | ICD-10-CM

## 2021-03-04 ENCOUNTER — Other Ambulatory Visit: Payer: Self-pay | Admitting: Allergy & Immunology

## 2021-03-11 NOTE — Progress Notes (Signed)
COMMUNITY PALLIATIVE CARE RN NOTE  PATIENT NAME: Virginia [name] DOB: 11-Sep-1936 MRN: [id number]  PRIMARY CARE PROVIDER: Lauree Chandler, NP  RESPONSIBLE PARTY: [name] (daughter) Acct ID - Guarantor Home Phone Work Phone Relationship Acct Type  [id number] MEHER, KUCINSKI* [phone]  Self P/F     [address], [place], [place] [address]   Covid-19 Pre-screening Negative  PLAN OF CARE and INTERVENTION:  1. ADVANCE CARE PLANNING/GOALS OF CARE: Goal is for patient to remain in her home with hired caregivers. She is a Full Code. 2. PATIENT/CAREGIVER EDUCATION: Symptom management and s/s of infection 3. DISEASE STATUS: Met with patient and her brother [name] in the home. Patient's sister was also present and has been visiting for a few days to help out. Upon my arrival, patient is lying in bed with HOB elevated awake and alert. Her mood was pleasant and she is smiling often. She is able to answer simple questions with short replies, but if she speaks in sentences, much of her words are unintelligible. She denies pain at this time. They do continue to apply topical analgesics to her right shoulder when she complains and she is taking Tylenol twice daily. She continues with oxygen continuously at 2L/min via . No wheezing or shortness of breath noted today. She is doing her nebulizer treatments 1-2x/day. Her appetite is variable, but has been very good today. She takes her medications crushed in applesauce. She remains dependent for all ADLs. She continues with hired caregivers x 2 from 9a-9p 7 days/week. Patient does try to hit at and scratch CNAs during personal care, but she is much more manageable since being on Depakote Sprinkles. Her bottom is clear with no skin breakdown. They continue to apply End-It cream to her bottom after each incontinent episode. She does have a scratch on her left hand which was self inflicted that occurred today. Family covered it with a dressing for protection. She  does have some tiny blisters that occur in and around her abdominal folds from her bullous pemphigoid. Overall, patient is having a good day today. Called and spoke with patient's daughter, [name], who is currently in [place] to provide patient update. She is appreciative. Will continue to monitor.   HISTORY OF PRESENT ILLNESS: This is a 85 yo female with a diagnosis of dementia. She has a hx of essential hypertension, CHF, COPD, GERD, CKD stage III and anxiety. Palliative care team continues to follow patient and visits monthly and PRN. She continues with 2 hired caregivers from [place] to 9p daily.    CODE STATUS: Full code ADVANCED DIRECTIVES: Y MOST FORM: no PPS: 30%   PHYSICAL EXAM:   LUNGS: clear to auscultation  CARDIAC: Cor RRR EXTREMITIES: No edema SKIN: scratch to her left hand (dressing dry and intact)  NEURO: Alert and oriented to self, intermittently confused, generalized weakness, bed bound    (Duration of visit and documentation 60 minutes)   [name], RN BSN

## 2021-03-19 NOTE — Telephone Encounter (Signed)
Pt's son called back & scheduled telehealth visit for pt on 03/25/2021 with Chrissie to receive refills.

## 2021-03-21 ENCOUNTER — Other Ambulatory Visit: Payer: Self-pay | Admitting: Allergy & Immunology

## 2021-03-21 ENCOUNTER — Other Ambulatory Visit: Payer: Self-pay | Admitting: Physician Assistant

## 2021-03-21 DIAGNOSIS — I10 Essential (primary) hypertension: Secondary | ICD-10-CM

## 2021-03-21 DIAGNOSIS — R251 Tremor, unspecified: Secondary | ICD-10-CM

## 2021-03-21 DIAGNOSIS — E0590 Thyrotoxicosis, unspecified without thyrotoxic crisis or storm: Secondary | ICD-10-CM

## 2021-03-21 NOTE — Telephone Encounter (Signed)
Rejecting because it is a duplicate order

## 2021-03-23 NOTE — Patient Instructions (Addendum)
1. Chronic obstructive pulmonary disease  -Stop Breo - Daily controller medication(s): Increase budesonide (Pulmicort) to twice a day  - Rescue medications: albuterol 4 puffs every 4-6 hours as needed, albuterol nebulizer one vial puffs every 4-6 hours as needed or DuoNeb nebulizer one vial every 4-6 hours as needed - Asthma control goals:  * Full participation in all desired activities (may need albuterol before activity) * Albuterol use two time or less a week on average (not counting use with activity) * Cough interfering with sleep two time or less a month * Oral steroids no more than once a year * No hospitalizations  2. Non-allergic rhinitis - Stop chlorpheniramine - You may useFlonase (fluticasons) one spray per nostril daily  -Re-start carbinoxamine 4 mg every 8 hours as needed.  Please let us know if this treatment plan is not working well for you. Schedule a follow up appointment in 3 months

## 2021-03-25 ENCOUNTER — Encounter: Payer: Self-pay | Admitting: Family

## 2021-03-25 ENCOUNTER — Ambulatory Visit (INDEPENDENT_AMBULATORY_CARE_PROVIDER_SITE_OTHER): Payer: Medicare Other | Admitting: Family

## 2021-03-25 ENCOUNTER — Other Ambulatory Visit: Payer: Self-pay

## 2021-03-25 ENCOUNTER — Telehealth: Payer: Self-pay | Admitting: *Deleted

## 2021-03-25 DIAGNOSIS — J310 Chronic rhinitis: Secondary | ICD-10-CM | POA: Diagnosis not present

## 2021-03-25 DIAGNOSIS — J449 Chronic obstructive pulmonary disease, unspecified: Secondary | ICD-10-CM | POA: Diagnosis not present

## 2021-03-25 MED ORDER — CARBINOXAMINE MALEATE 4 MG PO TABS: 1.0000 | ORAL_TABLET | Freq: Two times a day (BID) | ORAL | 5 refills | Status: DC

## 2021-03-25 MED ORDER — FLUTICASONE PROPIONATE 50 MCG/ACT NA SUSP: NASAL | 2 refills | Status: DC

## 2021-03-25 NOTE — Progress Notes (Signed)
RE: [name] MRN: [id number] DOB: 1936-01-27 Date of Telemedicine Visit: 03/25/2021  Referring provider: Lauree Chandler, NP Primary care provider: Lauree Chandler, NP  Chief Complaint: Asthma (Has been having wheezing, Pulmicort neb solution has been helping and it helps with nasal drainage also water eyes ) and Allergic Rhinitis  (Watery/ itchy eyes and runny nose )   Telemedicine Follow Up Visit via Telephone: I connected with Virginia [name] for a follow up on 03/25/21 by telephone and verified that I am speaking with the correct person using two identifiers.   I discussed the limitations, risks, security and privacy concerns of performing an evaluation and management service by telephone and the availability of in person appointments. I also discussed with the patient that there may be a patient responsible charge related to this service. The patient expressed understanding and agreed to proceed.  Patient is at home accompanied by her two sons who provided/contributed to the history.  Provider is at the office.  Visit start time: 10:03 AM Visit end time: 10:28 AM Insurance consent/check in by: Virginia [name] Medical consent and medical assistant/nurse: Virginia [name]  History of Present Illness: She is a 85 y.o. female, who is being followed for COPD with acute exacerbation and nonallergic rhinitis.. Her previous allergy office visit was on July 21, 2019 with Dr. Ernst Bowler.   COPD is reported as moderately controlled with budesonide 0.5 mg once a day and sometimes twice a day.  Her sons report that it is hard to get her to do the nighttime dosing.  They also use albuterol/ipratropium bromide nebulizer at least once a day.  They feel that this regimen helps with her breathing.  They report a little bit of productive cough with clear sputum and wheezing.  They deny any shortness of breath or tightness in her chest.  Since her last office visit she has not required any systemic steroids or  made any trips to the emergency room or urgent care due to breathing problems.  They are not using Breo 100/25 mcg 1 puff once a day because it is past the point of her understanding how to use the medication.  They report that she is bedridden and suffers from dementia.  She is also mostly nonverbal.  Nonallergic rhinitis is reported as not well controlled with chlorpheniramine that the pharmacy recommended.  She has been out of carbinoxamine for a couple weeks and they can tell a difference.  They do not use Flonase nasal spray.  They report clear rhinorrhea, nasal congestion and they think postnasal drip.  They have been giving her guaifenesin to help with this.  Assessment and Plan: Virginia [name] is a 85 y.o. female with: Patient Instructions  1. Chronic obstructive pulmonary disease  -Stop Breo - Daily controller medication(s): Increase budesonide (Pulmicort) to twice a day  - Rescue medications: albuterol 4 puffs every 4-6 hours as needed, albuterol nebulizer one vial puffs every 4-6 hours as needed or DuoNeb nebulizer one vial every 4-6 hours as needed - Asthma control goals:  * Full participation in all desired activities (may need albuterol before activity) * Albuterol use two time or less a week on average (not counting use with activity) * Cough interfering with sleep two time or less a month * Oral steroids no more than once a year * No hospitalizations  2. Non-allergic rhinitis - Stop chlorpheniramine - You may useFlonase (fluticasons) one spray per nostril daily  -Re-start carbinoxamine 4 mg every 8 hours as needed.  Please let us know if this treatment plan is not working well for you. Schedule a follow up appointment in 3 months      Return in about 3 months (around 06/24/2021), or if symptoms worsen or fail to improve.  No orders of the defined types were placed in this encounter.  Lab Orders  No laboratory test(s) ordered today    Diagnostics: None.  Medication List:   Current Outpatient Medications  Medication Sig Dispense Refill  . acetaminophen (TYLENOL) 500 MG tablet Take 1,000 mg by mouth in the morning and at bedtime.     Marland Kitchen aspirin EC 81 MG tablet Take 81 mg by mouth in the morning and at bedtime.     . budesonide (PULMICORT) 0.5 MG/2ML nebulizer solution Take 2 mLs (0.5 mg total) by nebulization 2 (two) times daily. 2 mL 5  . Carbinoxamine Maleate 4 MG TABS Take 1 tablet (4 mg total) by mouth in the morning and at bedtime. 60 tablet 5  . clobetasol cream (TEMOVATE) 0.05 % Apply topically 2 (two) times daily.    . diclofenac Sodium (VOLTAREN) 1 % GEL Apply 2 g topically in the morning and at bedtime.    . divalproex (DEPAKOTE SPRINKLE) 125 MG capsule Take 1 capsule (125 mg total) by mouth 2 (two) times daily. 60 capsule 5  . donepezil (ARICEPT) 10 MG tablet Take 1 tablet (10 mg total) by mouth daily. 90 tablet 1  . furosemide (LASIX) 20 MG tablet Take 1 tablet (20 mg total) by mouth daily. 90 tablet 3  . ipratropium-albuterol (DUONEB) 0.5-2.5 (3) MG/3ML SOLN USE 1 VIAL IN NEBULIZER EVERY 6 HOURS - and as needed 3 mL 11  . memantine (NAMENDA) 10 MG tablet Take 1 tablet (10 mg total) by mouth 2 (two) times daily. 180 tablet 1  . ondansetron (ZOFRAN-ODT) 4 MG disintegrating tablet Take 4 mg by mouth as needed for nausea or vomiting.    . pantoprazole (PROTONIX) 40 MG tablet TAKE 1 TABLET(40 MG) BY MOUTH TWICE DAILY 180 tablet 1  . propranolol (INDERAL) 20 MG tablet TAKE 1 TABLET(20 MG) BY MOUTH TWICE DAILY 180 tablet 3  . QUEtiapine (SEROQUEL) 25 MG tablet Take 1 tablet (25 mg total) by mouth at bedtime. 90 tablet 1  . rosuvastatin (CRESTOR) 20 MG tablet Take 1 tablet (20 mg total) by mouth daily. 90 tablet 3  . sertraline (ZOLOFT) 50 MG tablet Take 2 tablets (100 mg total) by mouth daily. 60 tablet 5   No current facility-administered medications for this visit.   Allergies: Allergies  Allergen Reactions  . Lidocaine Anaphylaxis, Swelling, Rash and  Other (See Comments)    Any of the " [facility] "  . Other Other (See Comments)    All drugs that end in "cane'-  novacane etc. Daughter states patient almost died when she had it when she was born   I reviewed her past medical history, social history, family history, and environmental history and no significant changes have been reported from previous visit on 07/21/2019.  Review of Systems  Constitutional: Negative for chills and fever.       Sweating profusely and feels cold  HENT:       Reports clear rhinorrhea, post nasal drip, and nasal conegstion  Eyes:       Reports itchy watery eyes  Respiratory: Positive for cough and wheezing. Negative for chest tightness and shortness of breath.        Her son reports a little  bit of cough that is productive with clear sputum and a little bit of wheezing  Cardiovascular: Negative for chest pain.  Gastrointestinal: Negative for abdominal pain.  Genitourinary: Negative for dysuria.  Skin: Negative for rash.  Allergic/Immunologic: Positive for environmental allergies.  Neurological: Negative for headaches.   Objective: Physical Exam Not obtained as encounter was done via telephone.   Previous notes and tests were reviewed.  I discussed the assessment and treatment plan with the patient. The patient was provided an opportunity to ask questions and all were answered. The patient agreed with the plan and demonstrated an understanding of the instructions.   The patient was advised to call back or seek an in-person evaluation if the symptoms worsen or if the condition fails to improve as anticipated.  I provided 25 minutes of non-face-to-face time during this encounter.  It was my pleasure to participate in [place] care today. Please feel free to contact me with any questions or concerns.   Sincerely,  Althea Charon, FNP

## 2021-03-25 NOTE — Telephone Encounter (Signed)
Called and spoke with patient's daughter, [name], to schedule a palliative care home visit. Visit scheduled for 4/26@1p .

## 2021-03-26 ENCOUNTER — Other Ambulatory Visit: Payer: Medicare Other | Admitting: *Deleted

## 2021-03-26 ENCOUNTER — Other Ambulatory Visit: Payer: Self-pay

## 2021-03-26 VITALS — BP 142/82 | HR 79 | Temp 98.0°F | Resp 16

## 2021-03-26 DIAGNOSIS — Z515 Encounter for palliative care: Secondary | ICD-10-CM

## 2021-03-26 NOTE — Progress Notes (Signed)
COMMUNITY PALLIATIVE CARE RN NOTE  PATIENT NAME: Virginia [name] DOB: [birth year] MRN: [id number]  PRIMARY CARE PROVIDER: Sharon Seller, NP  RESPONSIBLE PARTY: Harrie Foreman (daughter) Acct ID - Guarantor Home Phone Work Phone Relationship Acct Type  [id number] DARNELLA, ZEITER* [phone]  Self P/F     [address] [place], [place] [address]   Covid-19 Pre-screening Negative  PLAN OF CARE and INTERVENTION:  1. ADVANCE CARE PLANNING/GOALS OF CARE: Goal is for patient to remain in her home, be as comfortable as possible and avoid hospitalizations. She is a Full code. 2. PATIENT/CAREGIVER EDUCATION: Symptom management, skin breakdown prevention, s/s of infection 3. DISEASE STATUS: Face-to-face visit completed in patient's home. Met with patient's daughter, Nicole Cella, and two hired caregivers who are present from 9a-9p daily to assist with personal care needs. Upon arrival, patient is lying in bed with her head of bed elevated. She is awake, alert, pleasant and smiling. She did become slightly irritable with the blood pressure check and with personal care. She is able to answer simple questions with 1-3 word responses. Speech is more difficult to understand. Daughter reports that last week patient spiked a fever, highest temp was 103. Patient's blood pressure was elevated 200s/110s. She had refused to take her medications the prior day, but had taken it that morning. Patient was also lethargic, had a runny nose and not eating well. She was also drooling and diaphoretic. Daughter opted to keep patient at home and administered routine Tylenol and was able to get patient to take her medications. Covid test was negative. Patient has since improved. She ate well today. She had scrambled eggs for breakfast and 3 glasses of fluids so far today. She drank her chocolate protein drink for nutritional supplementation. She continues on 2L/min via [place]. Lungs are clear today with no shortness of breath noted. She had  been having a productive cough with clear phlegm along with some wheezing. She had a telehealth visit with her Allergist who did make some medication changes to her plan of care. She had a good bowel movement this morning of normal consistency. She is total care for ADLs and incontinent of both bowel and bladder. She is voiding well with no foul odors. No wounds observed or reported. They continue to apply EndIt cream after each incontinent episode. She did have some swelling and redness to her left eye from having her head leaning down and in that direction. CNAs elevated her head and area has much improved. I am unsure what caused the spike in temperature and fever last week. Today she appears stable. Will continue to monitor.   HISTORY OF PRESENT ILLNESS: This is a 85 yo female with a diagnosis of dementia.She has a hx of essential hypertension, CHF, COPD, GERD, CKD stage III and anxiety.Palliative care team continues to follow patient and visits monthly and PRN. She continues with 2 hired caregivers from 9a to 9p daily.    CODE STATUS: Full code ADVANCED DIRECTIVES: N MOST FORM: no PPS: 30%   PHYSICAL EXAM:   VITALS: Today's Vitals   03/26/21 1339  BP: (!) 142/82  Pulse: 79  Resp: 16  Temp: 98 F (36.7 C)  TempSrc: Temporal  SpO2: 97%  PainSc: 0-No pain    LUNGS: clear to auscultation  CARDIAC: Cor RRR EXTREMITIES: No edema SKIN: No skin breakdown noted or reported; has air mattress  NEURO: Alert and oriented to self, intermittently confused, total care, bedbound   (Duration of visit and documentation 60 minutes)  Gawain Crombie, RN BSN 

## 2021-04-10 ENCOUNTER — Telehealth: Payer: Self-pay | Admitting: Family

## 2021-04-10 MED ORDER — CARBINOXAMINE MALEATE 4 MG PO TABS: 1.0000 | ORAL_TABLET | Freq: Two times a day (BID) | ORAL | 5 refills | Status: DC

## 2021-04-10 NOTE — Telephone Encounter (Signed)
Called and left a message for patient to inform her that the medication has been sent to [facility].

## 2021-04-10 NOTE — Telephone Encounter (Signed)
Patient's son called and states the [facility] on [address] states they do not have the prescription for Carbinoxamine Maleate.   Please advise.

## 2021-04-15 ENCOUNTER — Other Ambulatory Visit: Payer: Self-pay

## 2021-04-15 ENCOUNTER — Other Ambulatory Visit: Payer: Medicare Other | Admitting: *Deleted

## 2021-04-15 VITALS — BP 150/90 | HR 117 | Temp 99.7°F | Resp 22

## 2021-04-15 DIAGNOSIS — Z515 Encounter for palliative care: Secondary | ICD-10-CM

## 2021-04-15 NOTE — Progress Notes (Signed)
COMMUNITY PALLIATIVE CARE RN NOTE  PATIENT NAME: Virginia [name] DOB: 08-21-1936 MRN: [id number]  PRIMARY CARE PROVIDER: Lauree Chandler, NP  RESPONSIBLE PARTY: [name] (daughter) Acct ID - Guarantor Home Phone Work Phone Relationship Acct Type  [id number] TANICIA, WOLAVER* [phone]  Self P/F     [address] [place], [place] [address]   Covid-19 Pre-screening Negative  PLAN OF CARE and INTERVENTION:  1. ADVANCE CARE PLANNING/GOALS OF CARE: Goal is for patient not to suffer and be as comfortable as possible. She remains a Full code. 2. PATIENT/CAREGIVER EDUCATION: Hospice education, disease progression and what to expect 3. DISEASE STATUS: Received a phone call today from patient's daughter, [name], requesting RN visit. Face-to-face visit completed as requested. Spoke with both [name] and patient's brother, [name] who report that patient continues to decline overall. She is able to be aroused with verbal and tactile stimulation, but is lethargic. She is total care and requires assistance with all ADLs, including feeding. She is leaning significantly to the left side and is now unable to hold her head up. When feeding patient, family has had to hold patient's head in an upright position. They have tried neck pillows and trying to elevate her head on multiple pillows without success. She did open her eyes briefly to the sound of my voice but remained non-verbal. Her intake has been poor. She is only taking in bites and sips. The last time they were able to get patient to eat or take her medications was yesterday morning. She has 2 hired caregivers from [place] through [place]. They report that patient has been sleeping through her baths and with personal care. Patient is usually combative during these tasks. She is incontinent of both bowel and bladder and wears adult briefs. She remains on oxygen at 2L/min via [place]. [name] states that last week she noticed patient experiencing some shortness of  breath when eating. She does not appear to be in any distress today. Lung sounds are clear. They continue to administer her nebulizer treatments twice daily. She has been running a temperature of ~101 on and off over the past few days. Her temperature during visit is 99.7. I recommended Tylenol suppositories to help for elevated temps. Her heart rate is elevated between 117-122. I notice some jerking/twitching of her arms and legs periodically throughout visit. Provided hospice education and what is offered. [name] and [name] are on board with initiating hospice services, however I also spoke with patient's son, [name], who declines wanting hospice at this time. He does not feel that it is necessary at this point. Explained that Palliative care is only 830-5p Monday through Friday, however with hospice there is a nurse available 24/7. He is ok with calling 911 if needed after hours if there are any further changes in her condition. Offered to come again this week to follow up on patient's condition. [name] replied "you can come, but I still don't think that will change my mind regarding hospice." When I was leaving, son went in to talk with patient and she woke up. They were able to give her a few bites of potato soup. I spoke with Waldron Session, RN with [name] to provide patient update, who states she has a pre-planned visit schedule for tomorrow and will give me an update regarding patient condition after her visit.   HISTORY OF PRESENT ILLNESS: This is a 85 yo female with a diagnosis of dementia.She has a hx of essential hypertension, CHF, COPD, GERD, CKD stage III and  anxiety.Palliative care team continues to follow patient and visits monthly and PRN. She continues with 2 hired caregivers from [place] to 9p daily.    CODE STATUS: Full code ADVANCED DIRECTIVES: N MOST FORM: no PPS: 30%   PHYSICAL EXAM:   VITALS: Today's Vitals   04/15/21 1332  BP: (!) 150/90  Pulse: (!) 117  Resp: (!) 22  Temp: 99.7  F (37.6 C)  TempSrc: Temporal  SpO2: 94%  PainSc: 0-No pain    LUNGS: clear to auscultation  CARDIAC: Cor tachy EXTREMITIES: No edema SKIN: Skin is clammy; skin shear to bottom is healing per caregivers (continue to apply End-It cream)  NEURO: Lethargic, minimally verbal, progressive generalized weakness, total care and bed bound   (Duration of visit and documentation 125 minutes)    [name], RN BSN

## 2021-04-22 DIAGNOSIS — J9611 Chronic respiratory failure with hypoxia: Secondary | ICD-10-CM | POA: Diagnosis not present

## 2021-04-22 DIAGNOSIS — J449 Chronic obstructive pulmonary disease, unspecified: Secondary | ICD-10-CM | POA: Diagnosis not present

## 2021-04-22 DIAGNOSIS — Z0000 Encounter for general adult medical examination without abnormal findings: Secondary | ICD-10-CM | POA: Diagnosis not present

## 2021-04-22 DIAGNOSIS — F0390 Unspecified dementia without behavioral disturbance: Secondary | ICD-10-CM | POA: Diagnosis not present

## 2021-04-22 DIAGNOSIS — Z7189 Other specified counseling: Secondary | ICD-10-CM | POA: Diagnosis not present

## 2021-04-22 DIAGNOSIS — Z7401 Bed confinement status: Secondary | ICD-10-CM | POA: Diagnosis not present

## 2021-04-23 ENCOUNTER — Telehealth: Payer: Self-pay | Admitting: *Deleted

## 2021-04-23 NOTE — Telephone Encounter (Signed)
Received a call from patient's daughter, [name]. She says that her brother, [name] called [facility] last week and Dr. Daphene Jaeger visited patient yesterday for an initial home MD visit/assessment. She reports that the visit went very well. Dr. Daphene Jaeger discussed hospice services with family and they have now agreed to hospice care. Family has also agreed to change patient's code status to a DNR. Hospice consult has been placed by MD and her hospice admission visit is scheduled for Thursday 5/26@3 :30p.

## 2021-04-25 DIAGNOSIS — R32 Unspecified urinary incontinence: Secondary | ICD-10-CM | POA: Diagnosis not present

## 2021-04-25 DIAGNOSIS — R159 Full incontinence of feces: Secondary | ICD-10-CM | POA: Diagnosis not present

## 2021-04-25 DIAGNOSIS — N183 Chronic kidney disease, stage 3 unspecified: Secondary | ICD-10-CM | POA: Diagnosis not present

## 2021-04-25 DIAGNOSIS — K219 Gastro-esophageal reflux disease without esophagitis: Secondary | ICD-10-CM | POA: Diagnosis not present

## 2021-04-25 DIAGNOSIS — Z9981 Dependence on supplemental oxygen: Secondary | ICD-10-CM | POA: Diagnosis not present

## 2021-04-25 DIAGNOSIS — J449 Chronic obstructive pulmonary disease, unspecified: Secondary | ICD-10-CM | POA: Diagnosis not present

## 2021-04-25 DIAGNOSIS — Z6825 Body mass index (BMI) 25.0-25.9, adult: Secondary | ICD-10-CM | POA: Diagnosis not present

## 2021-04-25 DIAGNOSIS — L409 Psoriasis, unspecified: Secondary | ICD-10-CM | POA: Diagnosis not present

## 2021-04-25 DIAGNOSIS — J302 Other seasonal allergic rhinitis: Secondary | ICD-10-CM | POA: Diagnosis not present

## 2021-04-25 DIAGNOSIS — F419 Anxiety disorder, unspecified: Secondary | ICD-10-CM | POA: Diagnosis not present

## 2021-04-25 DIAGNOSIS — F32A Depression, unspecified: Secondary | ICD-10-CM | POA: Diagnosis not present

## 2021-04-25 DIAGNOSIS — J9611 Chronic respiratory failure with hypoxia: Secondary | ICD-10-CM | POA: Diagnosis not present

## 2021-04-25 DIAGNOSIS — I129 Hypertensive chronic kidney disease with stage 1 through stage 4 chronic kidney disease, or unspecified chronic kidney disease: Secondary | ICD-10-CM | POA: Diagnosis not present

## 2021-04-26 DIAGNOSIS — N183 Chronic kidney disease, stage 3 unspecified: Secondary | ICD-10-CM | POA: Diagnosis not present

## 2021-04-26 DIAGNOSIS — J9611 Chronic respiratory failure with hypoxia: Secondary | ICD-10-CM | POA: Diagnosis not present

## 2021-04-26 DIAGNOSIS — J449 Chronic obstructive pulmonary disease, unspecified: Secondary | ICD-10-CM | POA: Diagnosis not present

## 2021-04-26 DIAGNOSIS — K219 Gastro-esophageal reflux disease without esophagitis: Secondary | ICD-10-CM | POA: Diagnosis not present

## 2021-04-26 DIAGNOSIS — F32A Depression, unspecified: Secondary | ICD-10-CM | POA: Diagnosis not present

## 2021-04-26 DIAGNOSIS — I129 Hypertensive chronic kidney disease with stage 1 through stage 4 chronic kidney disease, or unspecified chronic kidney disease: Secondary | ICD-10-CM | POA: Diagnosis not present

## 2021-04-30 DIAGNOSIS — K219 Gastro-esophageal reflux disease without esophagitis: Secondary | ICD-10-CM | POA: Diagnosis not present

## 2021-04-30 DIAGNOSIS — J449 Chronic obstructive pulmonary disease, unspecified: Secondary | ICD-10-CM | POA: Diagnosis not present

## 2021-04-30 DIAGNOSIS — F32A Depression, unspecified: Secondary | ICD-10-CM | POA: Diagnosis not present

## 2021-04-30 DIAGNOSIS — I129 Hypertensive chronic kidney disease with stage 1 through stage 4 chronic kidney disease, or unspecified chronic kidney disease: Secondary | ICD-10-CM | POA: Diagnosis not present

## 2021-04-30 DIAGNOSIS — N183 Chronic kidney disease, stage 3 unspecified: Secondary | ICD-10-CM | POA: Diagnosis not present

## 2021-04-30 DIAGNOSIS — J9611 Chronic respiratory failure with hypoxia: Secondary | ICD-10-CM | POA: Diagnosis not present

## 2021-05-01 DIAGNOSIS — L409 Psoriasis, unspecified: Secondary | ICD-10-CM | POA: Diagnosis not present

## 2021-05-01 DIAGNOSIS — Z9981 Dependence on supplemental oxygen: Secondary | ICD-10-CM | POA: Diagnosis not present

## 2021-05-01 DIAGNOSIS — J9611 Chronic respiratory failure with hypoxia: Secondary | ICD-10-CM | POA: Diagnosis not present

## 2021-05-01 DIAGNOSIS — I129 Hypertensive chronic kidney disease with stage 1 through stage 4 chronic kidney disease, or unspecified chronic kidney disease: Secondary | ICD-10-CM | POA: Diagnosis not present

## 2021-05-01 DIAGNOSIS — Z6825 Body mass index (BMI) 25.0-25.9, adult: Secondary | ICD-10-CM | POA: Diagnosis not present

## 2021-05-01 DIAGNOSIS — F419 Anxiety disorder, unspecified: Secondary | ICD-10-CM | POA: Diagnosis not present

## 2021-05-01 DIAGNOSIS — J449 Chronic obstructive pulmonary disease, unspecified: Secondary | ICD-10-CM | POA: Diagnosis not present

## 2021-05-01 DIAGNOSIS — J302 Other seasonal allergic rhinitis: Secondary | ICD-10-CM | POA: Diagnosis not present

## 2021-05-01 DIAGNOSIS — R32 Unspecified urinary incontinence: Secondary | ICD-10-CM | POA: Diagnosis not present

## 2021-05-01 DIAGNOSIS — F32A Depression, unspecified: Secondary | ICD-10-CM | POA: Diagnosis not present

## 2021-05-01 DIAGNOSIS — N183 Chronic kidney disease, stage 3 unspecified: Secondary | ICD-10-CM | POA: Diagnosis not present

## 2021-05-01 DIAGNOSIS — R159 Full incontinence of feces: Secondary | ICD-10-CM | POA: Diagnosis not present

## 2021-05-01 DIAGNOSIS — K219 Gastro-esophageal reflux disease without esophagitis: Secondary | ICD-10-CM | POA: Diagnosis not present

## 2021-05-03 DIAGNOSIS — N183 Chronic kidney disease, stage 3 unspecified: Secondary | ICD-10-CM | POA: Diagnosis not present

## 2021-05-03 DIAGNOSIS — J449 Chronic obstructive pulmonary disease, unspecified: Secondary | ICD-10-CM | POA: Diagnosis not present

## 2021-05-03 DIAGNOSIS — K219 Gastro-esophageal reflux disease without esophagitis: Secondary | ICD-10-CM | POA: Diagnosis not present

## 2021-05-03 DIAGNOSIS — I129 Hypertensive chronic kidney disease with stage 1 through stage 4 chronic kidney disease, or unspecified chronic kidney disease: Secondary | ICD-10-CM | POA: Diagnosis not present

## 2021-05-03 DIAGNOSIS — F32A Depression, unspecified: Secondary | ICD-10-CM | POA: Diagnosis not present

## 2021-05-03 DIAGNOSIS — J9611 Chronic respiratory failure with hypoxia: Secondary | ICD-10-CM | POA: Diagnosis not present

## 2021-05-07 DIAGNOSIS — N183 Chronic kidney disease, stage 3 unspecified: Secondary | ICD-10-CM | POA: Diagnosis not present

## 2021-05-07 DIAGNOSIS — J449 Chronic obstructive pulmonary disease, unspecified: Secondary | ICD-10-CM | POA: Diagnosis not present

## 2021-05-07 DIAGNOSIS — K219 Gastro-esophageal reflux disease without esophagitis: Secondary | ICD-10-CM | POA: Diagnosis not present

## 2021-05-07 DIAGNOSIS — J9611 Chronic respiratory failure with hypoxia: Secondary | ICD-10-CM | POA: Diagnosis not present

## 2021-05-07 DIAGNOSIS — F32A Depression, unspecified: Secondary | ICD-10-CM | POA: Diagnosis not present

## 2021-05-07 DIAGNOSIS — I129 Hypertensive chronic kidney disease with stage 1 through stage 4 chronic kidney disease, or unspecified chronic kidney disease: Secondary | ICD-10-CM | POA: Diagnosis not present

## 2021-05-13 ENCOUNTER — Telehealth: Payer: Self-pay | Admitting: Neurology

## 2021-05-13 NOTE — Telephone Encounter (Signed)
Pt's son, Darcus Austin (on [place]) called, she is bedridden and have dementia. Can her appt 05/15/21 be changed to a telephone visit. Would like a call from the nurse.

## 2021-05-13 NOTE — Telephone Encounter (Signed)
I called Mr. Kirkey son of patient.  I asked if Ms. Fullilove was seeing the palliative or primary care doctor if they might be able to take over refills due to condition and dementia. I relayed that we see pts at least once yearly for med refills.  If she is already seeing either palliative care or pcp and they are ok to refill these medications, then she can followup with [place] as needed.  He was would check and see and if still not able to get meds refilled then will call us back and reschedule.  Cancelled the appt at this time.

## 2021-05-15 ENCOUNTER — Telehealth: Payer: Medicare Other | Admitting: Neurology

## 2021-05-15 DIAGNOSIS — J449 Chronic obstructive pulmonary disease, unspecified: Secondary | ICD-10-CM | POA: Diagnosis not present

## 2021-05-15 DIAGNOSIS — F32A Depression, unspecified: Secondary | ICD-10-CM | POA: Diagnosis not present

## 2021-05-15 DIAGNOSIS — K219 Gastro-esophageal reflux disease without esophagitis: Secondary | ICD-10-CM | POA: Diagnosis not present

## 2021-05-15 DIAGNOSIS — N183 Chronic kidney disease, stage 3 unspecified: Secondary | ICD-10-CM | POA: Diagnosis not present

## 2021-05-15 DIAGNOSIS — I129 Hypertensive chronic kidney disease with stage 1 through stage 4 chronic kidney disease, or unspecified chronic kidney disease: Secondary | ICD-10-CM | POA: Diagnosis not present

## 2021-05-15 DIAGNOSIS — J9611 Chronic respiratory failure with hypoxia: Secondary | ICD-10-CM | POA: Diagnosis not present

## 2021-05-16 DIAGNOSIS — F32A Depression, unspecified: Secondary | ICD-10-CM | POA: Diagnosis not present

## 2021-05-16 DIAGNOSIS — J9611 Chronic respiratory failure with hypoxia: Secondary | ICD-10-CM | POA: Diagnosis not present

## 2021-05-16 DIAGNOSIS — J449 Chronic obstructive pulmonary disease, unspecified: Secondary | ICD-10-CM | POA: Diagnosis not present

## 2021-05-16 DIAGNOSIS — N183 Chronic kidney disease, stage 3 unspecified: Secondary | ICD-10-CM | POA: Diagnosis not present

## 2021-05-16 DIAGNOSIS — K219 Gastro-esophageal reflux disease without esophagitis: Secondary | ICD-10-CM | POA: Diagnosis not present

## 2021-05-16 DIAGNOSIS — I129 Hypertensive chronic kidney disease with stage 1 through stage 4 chronic kidney disease, or unspecified chronic kidney disease: Secondary | ICD-10-CM | POA: Diagnosis not present

## 2021-05-20 ENCOUNTER — Other Ambulatory Visit: Payer: Self-pay | Admitting: *Deleted

## 2021-05-20 MED ORDER — DONEPEZIL HCL 10 MG PO TABS: 10.0000 mg | ORAL_TABLET | Freq: Every day | ORAL | 0 refills | Status: DC

## 2021-05-22 DIAGNOSIS — I129 Hypertensive chronic kidney disease with stage 1 through stage 4 chronic kidney disease, or unspecified chronic kidney disease: Secondary | ICD-10-CM | POA: Diagnosis not present

## 2021-05-22 DIAGNOSIS — J449 Chronic obstructive pulmonary disease, unspecified: Secondary | ICD-10-CM | POA: Diagnosis not present

## 2021-05-22 DIAGNOSIS — F32A Depression, unspecified: Secondary | ICD-10-CM | POA: Diagnosis not present

## 2021-05-22 DIAGNOSIS — K219 Gastro-esophageal reflux disease without esophagitis: Secondary | ICD-10-CM | POA: Diagnosis not present

## 2021-05-22 DIAGNOSIS — J9611 Chronic respiratory failure with hypoxia: Secondary | ICD-10-CM | POA: Diagnosis not present

## 2021-05-22 DIAGNOSIS — N183 Chronic kidney disease, stage 3 unspecified: Secondary | ICD-10-CM | POA: Diagnosis not present

## 2021-05-23 ENCOUNTER — Other Ambulatory Visit: Payer: Self-pay | Admitting: Family

## 2021-05-23 DIAGNOSIS — J209 Acute bronchitis, unspecified: Secondary | ICD-10-CM

## 2021-05-23 NOTE — Telephone Encounter (Signed)
Patient not seen in person greater than 12 months, will send to Lauree Chandler, NP to review and approve

## 2021-05-29 DIAGNOSIS — J9611 Chronic respiratory failure with hypoxia: Secondary | ICD-10-CM | POA: Diagnosis not present

## 2021-05-29 DIAGNOSIS — F0390 Unspecified dementia without behavioral disturbance: Secondary | ICD-10-CM | POA: Diagnosis not present

## 2021-05-29 DIAGNOSIS — F419 Anxiety disorder, unspecified: Secondary | ICD-10-CM | POA: Diagnosis not present

## 2021-05-31 DIAGNOSIS — J302 Other seasonal allergic rhinitis: Secondary | ICD-10-CM | POA: Diagnosis not present

## 2021-05-31 DIAGNOSIS — F419 Anxiety disorder, unspecified: Secondary | ICD-10-CM | POA: Diagnosis not present

## 2021-05-31 DIAGNOSIS — Z9981 Dependence on supplemental oxygen: Secondary | ICD-10-CM | POA: Diagnosis not present

## 2021-05-31 DIAGNOSIS — I129 Hypertensive chronic kidney disease with stage 1 through stage 4 chronic kidney disease, or unspecified chronic kidney disease: Secondary | ICD-10-CM | POA: Diagnosis not present

## 2021-05-31 DIAGNOSIS — R32 Unspecified urinary incontinence: Secondary | ICD-10-CM | POA: Diagnosis not present

## 2021-05-31 DIAGNOSIS — N183 Chronic kidney disease, stage 3 unspecified: Secondary | ICD-10-CM | POA: Diagnosis not present

## 2021-05-31 DIAGNOSIS — R159 Full incontinence of feces: Secondary | ICD-10-CM | POA: Diagnosis not present

## 2021-05-31 DIAGNOSIS — K219 Gastro-esophageal reflux disease without esophagitis: Secondary | ICD-10-CM | POA: Diagnosis not present

## 2021-05-31 DIAGNOSIS — F32A Depression, unspecified: Secondary | ICD-10-CM | POA: Diagnosis not present

## 2021-05-31 DIAGNOSIS — J9611 Chronic respiratory failure with hypoxia: Secondary | ICD-10-CM | POA: Diagnosis not present

## 2021-05-31 DIAGNOSIS — J449 Chronic obstructive pulmonary disease, unspecified: Secondary | ICD-10-CM | POA: Diagnosis not present

## 2021-05-31 DIAGNOSIS — Z6825 Body mass index (BMI) 25.0-25.9, adult: Secondary | ICD-10-CM | POA: Diagnosis not present

## 2021-05-31 DIAGNOSIS — L409 Psoriasis, unspecified: Secondary | ICD-10-CM | POA: Diagnosis not present

## 2021-06-05 DIAGNOSIS — J9611 Chronic respiratory failure with hypoxia: Secondary | ICD-10-CM | POA: Diagnosis not present

## 2021-06-05 DIAGNOSIS — J449 Chronic obstructive pulmonary disease, unspecified: Secondary | ICD-10-CM | POA: Diagnosis not present

## 2021-06-05 DIAGNOSIS — N183 Chronic kidney disease, stage 3 unspecified: Secondary | ICD-10-CM | POA: Diagnosis not present

## 2021-06-05 DIAGNOSIS — K219 Gastro-esophageal reflux disease without esophagitis: Secondary | ICD-10-CM | POA: Diagnosis not present

## 2021-06-05 DIAGNOSIS — F32A Depression, unspecified: Secondary | ICD-10-CM | POA: Diagnosis not present

## 2021-06-05 DIAGNOSIS — I129 Hypertensive chronic kidney disease with stage 1 through stage 4 chronic kidney disease, or unspecified chronic kidney disease: Secondary | ICD-10-CM | POA: Diagnosis not present

## 2021-06-07 DIAGNOSIS — N183 Chronic kidney disease, stage 3 unspecified: Secondary | ICD-10-CM | POA: Diagnosis not present

## 2021-06-07 DIAGNOSIS — K219 Gastro-esophageal reflux disease without esophagitis: Secondary | ICD-10-CM | POA: Diagnosis not present

## 2021-06-07 DIAGNOSIS — J449 Chronic obstructive pulmonary disease, unspecified: Secondary | ICD-10-CM | POA: Diagnosis not present

## 2021-06-07 DIAGNOSIS — I129 Hypertensive chronic kidney disease with stage 1 through stage 4 chronic kidney disease, or unspecified chronic kidney disease: Secondary | ICD-10-CM | POA: Diagnosis not present

## 2021-06-07 DIAGNOSIS — J9611 Chronic respiratory failure with hypoxia: Secondary | ICD-10-CM | POA: Diagnosis not present

## 2021-06-07 DIAGNOSIS — F32A Depression, unspecified: Secondary | ICD-10-CM | POA: Diagnosis not present

## 2021-06-10 ENCOUNTER — Telehealth: Payer: Self-pay

## 2021-06-10 MED ORDER — MEMANTINE HCL 10 MG PO TABS: 10.0000 mg | ORAL_TABLET | Freq: Two times a day (BID) | ORAL | 1 refills | Status: DC

## 2021-06-10 NOTE — Telephone Encounter (Signed)
Refill request for [facility] sent to [facility]

## 2021-06-11 DIAGNOSIS — K219 Gastro-esophageal reflux disease without esophagitis: Secondary | ICD-10-CM | POA: Diagnosis not present

## 2021-06-11 DIAGNOSIS — J449 Chronic obstructive pulmonary disease, unspecified: Secondary | ICD-10-CM | POA: Diagnosis not present

## 2021-06-11 DIAGNOSIS — J9611 Chronic respiratory failure with hypoxia: Secondary | ICD-10-CM | POA: Diagnosis not present

## 2021-06-11 DIAGNOSIS — N183 Chronic kidney disease, stage 3 unspecified: Secondary | ICD-10-CM | POA: Diagnosis not present

## 2021-06-11 DIAGNOSIS — I129 Hypertensive chronic kidney disease with stage 1 through stage 4 chronic kidney disease, or unspecified chronic kidney disease: Secondary | ICD-10-CM | POA: Diagnosis not present

## 2021-06-11 DIAGNOSIS — F32A Depression, unspecified: Secondary | ICD-10-CM | POA: Diagnosis not present

## 2021-06-12 ENCOUNTER — Telehealth: Payer: Self-pay | Admitting: Emergency Medicine

## 2021-06-12 ENCOUNTER — Other Ambulatory Visit: Payer: Self-pay | Admitting: *Deleted

## 2021-06-12 DIAGNOSIS — F419 Anxiety disorder, unspecified: Secondary | ICD-10-CM

## 2021-06-12 MED ORDER — SERTRALINE HCL 50 MG PO TABS: 100.0000 mg | ORAL_TABLET | Freq: Every day | ORAL | 0 refills | Status: DC

## 2021-06-12 NOTE — Telephone Encounter (Signed)
Patient's daughter called in to discuss medications and patient has been admitted to Hospice.  She is concerned about them stopping the Aricept as well as the 07/02/21 appointment.  She stated there is no way they can get her to her appointment and are asking for a virtual visit.  I told her that I would send message to Dr. Jannifer Franklin and update her after I received an answer.  Patient denied further questions, verbalized understanding and expressed appreciation for the phone call.

## 2021-06-12 NOTE — Telephone Encounter (Signed)
I called the daughter.  The patient is now on hospice.  I would consider starting the Namenda and Aricept, she can continue the Zoloft and Depakote sprinkles to help with agitation.  If having further questions, they are to call me back.

## 2021-06-21 DIAGNOSIS — J449 Chronic obstructive pulmonary disease, unspecified: Secondary | ICD-10-CM | POA: Diagnosis not present

## 2021-06-21 DIAGNOSIS — K219 Gastro-esophageal reflux disease without esophagitis: Secondary | ICD-10-CM | POA: Diagnosis not present

## 2021-06-21 DIAGNOSIS — N183 Chronic kidney disease, stage 3 unspecified: Secondary | ICD-10-CM | POA: Diagnosis not present

## 2021-06-21 DIAGNOSIS — F32A Depression, unspecified: Secondary | ICD-10-CM | POA: Diagnosis not present

## 2021-06-21 DIAGNOSIS — I129 Hypertensive chronic kidney disease with stage 1 through stage 4 chronic kidney disease, or unspecified chronic kidney disease: Secondary | ICD-10-CM | POA: Diagnosis not present

## 2021-06-21 DIAGNOSIS — J9611 Chronic respiratory failure with hypoxia: Secondary | ICD-10-CM | POA: Diagnosis not present

## 2021-06-24 DIAGNOSIS — I129 Hypertensive chronic kidney disease with stage 1 through stage 4 chronic kidney disease, or unspecified chronic kidney disease: Secondary | ICD-10-CM | POA: Diagnosis not present

## 2021-06-24 DIAGNOSIS — F32A Depression, unspecified: Secondary | ICD-10-CM | POA: Diagnosis not present

## 2021-06-24 DIAGNOSIS — N183 Chronic kidney disease, stage 3 unspecified: Secondary | ICD-10-CM | POA: Diagnosis not present

## 2021-06-24 DIAGNOSIS — J9611 Chronic respiratory failure with hypoxia: Secondary | ICD-10-CM | POA: Diagnosis not present

## 2021-06-24 DIAGNOSIS — J449 Chronic obstructive pulmonary disease, unspecified: Secondary | ICD-10-CM | POA: Diagnosis not present

## 2021-06-24 DIAGNOSIS — K219 Gastro-esophageal reflux disease without esophagitis: Secondary | ICD-10-CM | POA: Diagnosis not present

## 2021-06-27 DIAGNOSIS — N183 Chronic kidney disease, stage 3 unspecified: Secondary | ICD-10-CM | POA: Diagnosis not present

## 2021-06-27 DIAGNOSIS — J449 Chronic obstructive pulmonary disease, unspecified: Secondary | ICD-10-CM | POA: Diagnosis not present

## 2021-06-27 DIAGNOSIS — J9611 Chronic respiratory failure with hypoxia: Secondary | ICD-10-CM | POA: Diagnosis not present

## 2021-06-27 DIAGNOSIS — I129 Hypertensive chronic kidney disease with stage 1 through stage 4 chronic kidney disease, or unspecified chronic kidney disease: Secondary | ICD-10-CM | POA: Diagnosis not present

## 2021-06-27 DIAGNOSIS — K219 Gastro-esophageal reflux disease without esophagitis: Secondary | ICD-10-CM | POA: Diagnosis not present

## 2021-06-27 DIAGNOSIS — F32A Depression, unspecified: Secondary | ICD-10-CM | POA: Diagnosis not present

## 2021-07-01 DIAGNOSIS — Z6825 Body mass index (BMI) 25.0-25.9, adult: Secondary | ICD-10-CM | POA: Diagnosis not present

## 2021-07-01 DIAGNOSIS — F32A Depression, unspecified: Secondary | ICD-10-CM | POA: Diagnosis not present

## 2021-07-01 DIAGNOSIS — N183 Chronic kidney disease, stage 3 unspecified: Secondary | ICD-10-CM | POA: Diagnosis not present

## 2021-07-01 DIAGNOSIS — L409 Psoriasis, unspecified: Secondary | ICD-10-CM | POA: Diagnosis not present

## 2021-07-01 DIAGNOSIS — I129 Hypertensive chronic kidney disease with stage 1 through stage 4 chronic kidney disease, or unspecified chronic kidney disease: Secondary | ICD-10-CM | POA: Diagnosis not present

## 2021-07-01 DIAGNOSIS — R32 Unspecified urinary incontinence: Secondary | ICD-10-CM | POA: Diagnosis not present

## 2021-07-01 DIAGNOSIS — J302 Other seasonal allergic rhinitis: Secondary | ICD-10-CM | POA: Diagnosis not present

## 2021-07-01 DIAGNOSIS — R159 Full incontinence of feces: Secondary | ICD-10-CM | POA: Diagnosis not present

## 2021-07-01 DIAGNOSIS — F419 Anxiety disorder, unspecified: Secondary | ICD-10-CM | POA: Diagnosis not present

## 2021-07-01 DIAGNOSIS — Z9981 Dependence on supplemental oxygen: Secondary | ICD-10-CM | POA: Diagnosis not present

## 2021-07-01 DIAGNOSIS — J449 Chronic obstructive pulmonary disease, unspecified: Secondary | ICD-10-CM | POA: Diagnosis not present

## 2021-07-01 DIAGNOSIS — K219 Gastro-esophageal reflux disease without esophagitis: Secondary | ICD-10-CM | POA: Diagnosis not present

## 2021-07-01 DIAGNOSIS — J9611 Chronic respiratory failure with hypoxia: Secondary | ICD-10-CM | POA: Diagnosis not present

## 2021-07-02 ENCOUNTER — Ambulatory Visit: Payer: Self-pay | Admitting: Family

## 2021-07-02 ENCOUNTER — Ambulatory Visit: Payer: Self-pay | Admitting: Neurology

## 2021-07-03 DIAGNOSIS — K219 Gastro-esophageal reflux disease without esophagitis: Secondary | ICD-10-CM | POA: Diagnosis not present

## 2021-07-03 DIAGNOSIS — N183 Chronic kidney disease, stage 3 unspecified: Secondary | ICD-10-CM | POA: Diagnosis not present

## 2021-07-03 DIAGNOSIS — I129 Hypertensive chronic kidney disease with stage 1 through stage 4 chronic kidney disease, or unspecified chronic kidney disease: Secondary | ICD-10-CM | POA: Diagnosis not present

## 2021-07-03 DIAGNOSIS — J449 Chronic obstructive pulmonary disease, unspecified: Secondary | ICD-10-CM | POA: Diagnosis not present

## 2021-07-03 DIAGNOSIS — F32A Depression, unspecified: Secondary | ICD-10-CM | POA: Diagnosis not present

## 2021-07-03 DIAGNOSIS — J9611 Chronic respiratory failure with hypoxia: Secondary | ICD-10-CM | POA: Diagnosis not present

## 2021-07-04 DIAGNOSIS — J449 Chronic obstructive pulmonary disease, unspecified: Secondary | ICD-10-CM | POA: Diagnosis not present

## 2021-07-04 DIAGNOSIS — F32A Depression, unspecified: Secondary | ICD-10-CM | POA: Diagnosis not present

## 2021-07-04 DIAGNOSIS — I129 Hypertensive chronic kidney disease with stage 1 through stage 4 chronic kidney disease, or unspecified chronic kidney disease: Secondary | ICD-10-CM | POA: Diagnosis not present

## 2021-07-04 DIAGNOSIS — N183 Chronic kidney disease, stage 3 unspecified: Secondary | ICD-10-CM | POA: Diagnosis not present

## 2021-07-04 DIAGNOSIS — K219 Gastro-esophageal reflux disease without esophagitis: Secondary | ICD-10-CM | POA: Diagnosis not present

## 2021-07-04 DIAGNOSIS — J9611 Chronic respiratory failure with hypoxia: Secondary | ICD-10-CM | POA: Diagnosis not present

## 2021-07-08 ENCOUNTER — Other Ambulatory Visit: Payer: Self-pay | Admitting: Nurse Practitioner

## 2021-07-11 DIAGNOSIS — J9611 Chronic respiratory failure with hypoxia: Secondary | ICD-10-CM | POA: Diagnosis not present

## 2021-07-11 DIAGNOSIS — F419 Anxiety disorder, unspecified: Secondary | ICD-10-CM | POA: Diagnosis not present

## 2021-07-11 DIAGNOSIS — J449 Chronic obstructive pulmonary disease, unspecified: Secondary | ICD-10-CM | POA: Diagnosis not present

## 2021-07-11 DIAGNOSIS — I129 Hypertensive chronic kidney disease with stage 1 through stage 4 chronic kidney disease, or unspecified chronic kidney disease: Secondary | ICD-10-CM | POA: Diagnosis not present

## 2021-07-11 DIAGNOSIS — F0390 Unspecified dementia without behavioral disturbance: Secondary | ICD-10-CM | POA: Diagnosis not present

## 2021-07-11 DIAGNOSIS — F32A Depression, unspecified: Secondary | ICD-10-CM | POA: Diagnosis not present

## 2021-07-11 DIAGNOSIS — K219 Gastro-esophageal reflux disease without esophagitis: Secondary | ICD-10-CM | POA: Diagnosis not present

## 2021-07-11 DIAGNOSIS — N183 Chronic kidney disease, stage 3 unspecified: Secondary | ICD-10-CM | POA: Diagnosis not present

## 2021-07-18 DIAGNOSIS — N183 Chronic kidney disease, stage 3 unspecified: Secondary | ICD-10-CM | POA: Diagnosis not present

## 2021-07-18 DIAGNOSIS — K219 Gastro-esophageal reflux disease without esophagitis: Secondary | ICD-10-CM | POA: Diagnosis not present

## 2021-07-18 DIAGNOSIS — I129 Hypertensive chronic kidney disease with stage 1 through stage 4 chronic kidney disease, or unspecified chronic kidney disease: Secondary | ICD-10-CM | POA: Diagnosis not present

## 2021-07-18 DIAGNOSIS — J9611 Chronic respiratory failure with hypoxia: Secondary | ICD-10-CM | POA: Diagnosis not present

## 2021-07-18 DIAGNOSIS — J449 Chronic obstructive pulmonary disease, unspecified: Secondary | ICD-10-CM | POA: Diagnosis not present

## 2021-07-18 DIAGNOSIS — F32A Depression, unspecified: Secondary | ICD-10-CM | POA: Diagnosis not present

## 2021-07-22 ENCOUNTER — Other Ambulatory Visit: Payer: Self-pay | Admitting: *Deleted

## 2021-07-22 MED ORDER — QUETIAPINE FUMARATE 25 MG PO TABS: 25.0000 mg | ORAL_TABLET | Freq: Every day | ORAL | 0 refills | Status: DC

## 2021-07-25 DIAGNOSIS — J449 Chronic obstructive pulmonary disease, unspecified: Secondary | ICD-10-CM | POA: Diagnosis not present

## 2021-07-25 DIAGNOSIS — K219 Gastro-esophageal reflux disease without esophagitis: Secondary | ICD-10-CM | POA: Diagnosis not present

## 2021-07-25 DIAGNOSIS — J9611 Chronic respiratory failure with hypoxia: Secondary | ICD-10-CM | POA: Diagnosis not present

## 2021-07-25 DIAGNOSIS — N183 Chronic kidney disease, stage 3 unspecified: Secondary | ICD-10-CM | POA: Diagnosis not present

## 2021-07-25 DIAGNOSIS — I129 Hypertensive chronic kidney disease with stage 1 through stage 4 chronic kidney disease, or unspecified chronic kidney disease: Secondary | ICD-10-CM | POA: Diagnosis not present

## 2021-07-25 DIAGNOSIS — F32A Depression, unspecified: Secondary | ICD-10-CM | POA: Diagnosis not present

## 2021-08-01 DIAGNOSIS — J9611 Chronic respiratory failure with hypoxia: Secondary | ICD-10-CM | POA: Diagnosis not present

## 2021-08-01 DIAGNOSIS — K219 Gastro-esophageal reflux disease without esophagitis: Secondary | ICD-10-CM | POA: Diagnosis not present

## 2021-08-01 DIAGNOSIS — R159 Full incontinence of feces: Secondary | ICD-10-CM | POA: Diagnosis not present

## 2021-08-01 DIAGNOSIS — Z6825 Body mass index (BMI) 25.0-25.9, adult: Secondary | ICD-10-CM | POA: Diagnosis not present

## 2021-08-01 DIAGNOSIS — I129 Hypertensive chronic kidney disease with stage 1 through stage 4 chronic kidney disease, or unspecified chronic kidney disease: Secondary | ICD-10-CM | POA: Diagnosis not present

## 2021-08-01 DIAGNOSIS — N183 Chronic kidney disease, stage 3 unspecified: Secondary | ICD-10-CM | POA: Diagnosis not present

## 2021-08-01 DIAGNOSIS — J302 Other seasonal allergic rhinitis: Secondary | ICD-10-CM | POA: Diagnosis not present

## 2021-08-01 DIAGNOSIS — Z9981 Dependence on supplemental oxygen: Secondary | ICD-10-CM | POA: Diagnosis not present

## 2021-08-01 DIAGNOSIS — L409 Psoriasis, unspecified: Secondary | ICD-10-CM | POA: Diagnosis not present

## 2021-08-01 DIAGNOSIS — J449 Chronic obstructive pulmonary disease, unspecified: Secondary | ICD-10-CM | POA: Diagnosis not present

## 2021-08-01 DIAGNOSIS — F419 Anxiety disorder, unspecified: Secondary | ICD-10-CM | POA: Diagnosis not present

## 2021-08-01 DIAGNOSIS — R32 Unspecified urinary incontinence: Secondary | ICD-10-CM | POA: Diagnosis not present

## 2021-08-01 DIAGNOSIS — F32A Depression, unspecified: Secondary | ICD-10-CM | POA: Diagnosis not present

## 2021-08-02 DIAGNOSIS — N183 Chronic kidney disease, stage 3 unspecified: Secondary | ICD-10-CM | POA: Diagnosis not present

## 2021-08-02 DIAGNOSIS — J9611 Chronic respiratory failure with hypoxia: Secondary | ICD-10-CM | POA: Diagnosis not present

## 2021-08-02 DIAGNOSIS — F32A Depression, unspecified: Secondary | ICD-10-CM | POA: Diagnosis not present

## 2021-08-02 DIAGNOSIS — J449 Chronic obstructive pulmonary disease, unspecified: Secondary | ICD-10-CM | POA: Diagnosis not present

## 2021-08-02 DIAGNOSIS — I129 Hypertensive chronic kidney disease with stage 1 through stage 4 chronic kidney disease, or unspecified chronic kidney disease: Secondary | ICD-10-CM | POA: Diagnosis not present

## 2021-08-02 DIAGNOSIS — K219 Gastro-esophageal reflux disease without esophagitis: Secondary | ICD-10-CM | POA: Diagnosis not present

## 2021-08-06 DIAGNOSIS — J449 Chronic obstructive pulmonary disease, unspecified: Secondary | ICD-10-CM | POA: Diagnosis not present

## 2021-08-06 DIAGNOSIS — K219 Gastro-esophageal reflux disease without esophagitis: Secondary | ICD-10-CM | POA: Diagnosis not present

## 2021-08-06 DIAGNOSIS — J9611 Chronic respiratory failure with hypoxia: Secondary | ICD-10-CM | POA: Diagnosis not present

## 2021-08-06 DIAGNOSIS — I129 Hypertensive chronic kidney disease with stage 1 through stage 4 chronic kidney disease, or unspecified chronic kidney disease: Secondary | ICD-10-CM | POA: Diagnosis not present

## 2021-08-06 DIAGNOSIS — F32A Depression, unspecified: Secondary | ICD-10-CM | POA: Diagnosis not present

## 2021-08-06 DIAGNOSIS — N183 Chronic kidney disease, stage 3 unspecified: Secondary | ICD-10-CM | POA: Diagnosis not present

## 2021-08-07 DIAGNOSIS — K219 Gastro-esophageal reflux disease without esophagitis: Secondary | ICD-10-CM | POA: Diagnosis not present

## 2021-08-07 DIAGNOSIS — N183 Chronic kidney disease, stage 3 unspecified: Secondary | ICD-10-CM | POA: Diagnosis not present

## 2021-08-07 DIAGNOSIS — J449 Chronic obstructive pulmonary disease, unspecified: Secondary | ICD-10-CM | POA: Diagnosis not present

## 2021-08-07 DIAGNOSIS — J9611 Chronic respiratory failure with hypoxia: Secondary | ICD-10-CM | POA: Diagnosis not present

## 2021-08-07 DIAGNOSIS — I129 Hypertensive chronic kidney disease with stage 1 through stage 4 chronic kidney disease, or unspecified chronic kidney disease: Secondary | ICD-10-CM | POA: Diagnosis not present

## 2021-08-07 DIAGNOSIS — F32A Depression, unspecified: Secondary | ICD-10-CM | POA: Diagnosis not present

## 2021-08-15 DIAGNOSIS — J449 Chronic obstructive pulmonary disease, unspecified: Secondary | ICD-10-CM | POA: Diagnosis not present

## 2021-08-15 DIAGNOSIS — I129 Hypertensive chronic kidney disease with stage 1 through stage 4 chronic kidney disease, or unspecified chronic kidney disease: Secondary | ICD-10-CM | POA: Diagnosis not present

## 2021-08-15 DIAGNOSIS — N183 Chronic kidney disease, stage 3 unspecified: Secondary | ICD-10-CM | POA: Diagnosis not present

## 2021-08-15 DIAGNOSIS — F32A Depression, unspecified: Secondary | ICD-10-CM | POA: Diagnosis not present

## 2021-08-15 DIAGNOSIS — J9611 Chronic respiratory failure with hypoxia: Secondary | ICD-10-CM | POA: Diagnosis not present

## 2021-08-15 DIAGNOSIS — K219 Gastro-esophageal reflux disease without esophagitis: Secondary | ICD-10-CM | POA: Diagnosis not present

## 2021-08-20 DIAGNOSIS — J9611 Chronic respiratory failure with hypoxia: Secondary | ICD-10-CM | POA: Diagnosis not present

## 2021-08-20 DIAGNOSIS — I129 Hypertensive chronic kidney disease with stage 1 through stage 4 chronic kidney disease, or unspecified chronic kidney disease: Secondary | ICD-10-CM | POA: Diagnosis not present

## 2021-08-20 DIAGNOSIS — K219 Gastro-esophageal reflux disease without esophagitis: Secondary | ICD-10-CM | POA: Diagnosis not present

## 2021-08-20 DIAGNOSIS — J449 Chronic obstructive pulmonary disease, unspecified: Secondary | ICD-10-CM | POA: Diagnosis not present

## 2021-08-20 DIAGNOSIS — N183 Chronic kidney disease, stage 3 unspecified: Secondary | ICD-10-CM | POA: Diagnosis not present

## 2021-08-20 DIAGNOSIS — F32A Depression, unspecified: Secondary | ICD-10-CM | POA: Diagnosis not present

## 2021-08-21 DIAGNOSIS — J449 Chronic obstructive pulmonary disease, unspecified: Secondary | ICD-10-CM | POA: Diagnosis not present

## 2021-08-21 DIAGNOSIS — N183 Chronic kidney disease, stage 3 unspecified: Secondary | ICD-10-CM | POA: Diagnosis not present

## 2021-08-21 DIAGNOSIS — F32A Depression, unspecified: Secondary | ICD-10-CM | POA: Diagnosis not present

## 2021-08-21 DIAGNOSIS — I129 Hypertensive chronic kidney disease with stage 1 through stage 4 chronic kidney disease, or unspecified chronic kidney disease: Secondary | ICD-10-CM | POA: Diagnosis not present

## 2021-08-21 DIAGNOSIS — K219 Gastro-esophageal reflux disease without esophagitis: Secondary | ICD-10-CM | POA: Diagnosis not present

## 2021-08-21 DIAGNOSIS — J9611 Chronic respiratory failure with hypoxia: Secondary | ICD-10-CM | POA: Diagnosis not present

## 2021-08-28 DIAGNOSIS — J9611 Chronic respiratory failure with hypoxia: Secondary | ICD-10-CM | POA: Diagnosis not present

## 2021-08-28 DIAGNOSIS — F32A Depression, unspecified: Secondary | ICD-10-CM | POA: Diagnosis not present

## 2021-08-28 DIAGNOSIS — N183 Chronic kidney disease, stage 3 unspecified: Secondary | ICD-10-CM | POA: Diagnosis not present

## 2021-08-28 DIAGNOSIS — K219 Gastro-esophageal reflux disease without esophagitis: Secondary | ICD-10-CM | POA: Diagnosis not present

## 2021-08-28 DIAGNOSIS — I129 Hypertensive chronic kidney disease with stage 1 through stage 4 chronic kidney disease, or unspecified chronic kidney disease: Secondary | ICD-10-CM | POA: Diagnosis not present

## 2021-08-28 DIAGNOSIS — J449 Chronic obstructive pulmonary disease, unspecified: Secondary | ICD-10-CM | POA: Diagnosis not present

## 2021-08-30 DIAGNOSIS — J9611 Chronic respiratory failure with hypoxia: Secondary | ICD-10-CM | POA: Diagnosis not present

## 2021-08-30 DIAGNOSIS — I129 Hypertensive chronic kidney disease with stage 1 through stage 4 chronic kidney disease, or unspecified chronic kidney disease: Secondary | ICD-10-CM | POA: Diagnosis not present

## 2021-08-30 DIAGNOSIS — J449 Chronic obstructive pulmonary disease, unspecified: Secondary | ICD-10-CM | POA: Diagnosis not present

## 2021-08-30 DIAGNOSIS — N183 Chronic kidney disease, stage 3 unspecified: Secondary | ICD-10-CM | POA: Diagnosis not present

## 2021-08-30 DIAGNOSIS — K219 Gastro-esophageal reflux disease without esophagitis: Secondary | ICD-10-CM | POA: Diagnosis not present

## 2021-08-30 DIAGNOSIS — F32A Depression, unspecified: Secondary | ICD-10-CM | POA: Diagnosis not present

## 2021-08-31 DIAGNOSIS — J9611 Chronic respiratory failure with hypoxia: Secondary | ICD-10-CM | POA: Diagnosis not present

## 2021-08-31 DIAGNOSIS — K219 Gastro-esophageal reflux disease without esophagitis: Secondary | ICD-10-CM | POA: Diagnosis not present

## 2021-08-31 DIAGNOSIS — Z9981 Dependence on supplemental oxygen: Secondary | ICD-10-CM | POA: Diagnosis not present

## 2021-08-31 DIAGNOSIS — Z6825 Body mass index (BMI) 25.0-25.9, adult: Secondary | ICD-10-CM | POA: Diagnosis not present

## 2021-08-31 DIAGNOSIS — F419 Anxiety disorder, unspecified: Secondary | ICD-10-CM | POA: Diagnosis not present

## 2021-08-31 DIAGNOSIS — L409 Psoriasis, unspecified: Secondary | ICD-10-CM | POA: Diagnosis not present

## 2021-08-31 DIAGNOSIS — R159 Full incontinence of feces: Secondary | ICD-10-CM | POA: Diagnosis not present

## 2021-08-31 DIAGNOSIS — R32 Unspecified urinary incontinence: Secondary | ICD-10-CM | POA: Diagnosis not present

## 2021-08-31 DIAGNOSIS — J302 Other seasonal allergic rhinitis: Secondary | ICD-10-CM | POA: Diagnosis not present

## 2021-08-31 DIAGNOSIS — F32A Depression, unspecified: Secondary | ICD-10-CM | POA: Diagnosis not present

## 2021-08-31 DIAGNOSIS — N183 Chronic kidney disease, stage 3 unspecified: Secondary | ICD-10-CM | POA: Diagnosis not present

## 2021-08-31 DIAGNOSIS — I129 Hypertensive chronic kidney disease with stage 1 through stage 4 chronic kidney disease, or unspecified chronic kidney disease: Secondary | ICD-10-CM | POA: Diagnosis not present

## 2021-08-31 DIAGNOSIS — J449 Chronic obstructive pulmonary disease, unspecified: Secondary | ICD-10-CM | POA: Diagnosis not present

## 2021-09-03 DIAGNOSIS — I129 Hypertensive chronic kidney disease with stage 1 through stage 4 chronic kidney disease, or unspecified chronic kidney disease: Secondary | ICD-10-CM | POA: Diagnosis not present

## 2021-09-03 DIAGNOSIS — K219 Gastro-esophageal reflux disease without esophagitis: Secondary | ICD-10-CM | POA: Diagnosis not present

## 2021-09-03 DIAGNOSIS — J449 Chronic obstructive pulmonary disease, unspecified: Secondary | ICD-10-CM | POA: Diagnosis not present

## 2021-09-03 DIAGNOSIS — N183 Chronic kidney disease, stage 3 unspecified: Secondary | ICD-10-CM | POA: Diagnosis not present

## 2021-09-03 DIAGNOSIS — F32A Depression, unspecified: Secondary | ICD-10-CM | POA: Diagnosis not present

## 2021-09-03 DIAGNOSIS — J9611 Chronic respiratory failure with hypoxia: Secondary | ICD-10-CM | POA: Diagnosis not present

## 2021-09-05 DIAGNOSIS — J449 Chronic obstructive pulmonary disease, unspecified: Secondary | ICD-10-CM | POA: Diagnosis not present

## 2021-09-05 DIAGNOSIS — K219 Gastro-esophageal reflux disease without esophagitis: Secondary | ICD-10-CM | POA: Diagnosis not present

## 2021-09-05 DIAGNOSIS — F32A Depression, unspecified: Secondary | ICD-10-CM | POA: Diagnosis not present

## 2021-09-05 DIAGNOSIS — J9611 Chronic respiratory failure with hypoxia: Secondary | ICD-10-CM | POA: Diagnosis not present

## 2021-09-05 DIAGNOSIS — N183 Chronic kidney disease, stage 3 unspecified: Secondary | ICD-10-CM | POA: Diagnosis not present

## 2021-09-05 DIAGNOSIS — I129 Hypertensive chronic kidney disease with stage 1 through stage 4 chronic kidney disease, or unspecified chronic kidney disease: Secondary | ICD-10-CM | POA: Diagnosis not present

## 2021-09-13 DIAGNOSIS — I129 Hypertensive chronic kidney disease with stage 1 through stage 4 chronic kidney disease, or unspecified chronic kidney disease: Secondary | ICD-10-CM | POA: Diagnosis not present

## 2021-09-13 DIAGNOSIS — J9611 Chronic respiratory failure with hypoxia: Secondary | ICD-10-CM | POA: Diagnosis not present

## 2021-09-13 DIAGNOSIS — N183 Chronic kidney disease, stage 3 unspecified: Secondary | ICD-10-CM | POA: Diagnosis not present

## 2021-09-13 DIAGNOSIS — F32A Depression, unspecified: Secondary | ICD-10-CM | POA: Diagnosis not present

## 2021-09-13 DIAGNOSIS — J449 Chronic obstructive pulmonary disease, unspecified: Secondary | ICD-10-CM | POA: Diagnosis not present

## 2021-09-13 DIAGNOSIS — K219 Gastro-esophageal reflux disease without esophagitis: Secondary | ICD-10-CM | POA: Diagnosis not present

## 2021-09-20 DIAGNOSIS — N183 Chronic kidney disease, stage 3 unspecified: Secondary | ICD-10-CM | POA: Diagnosis not present

## 2021-09-20 DIAGNOSIS — I129 Hypertensive chronic kidney disease with stage 1 through stage 4 chronic kidney disease, or unspecified chronic kidney disease: Secondary | ICD-10-CM | POA: Diagnosis not present

## 2021-09-20 DIAGNOSIS — F32A Depression, unspecified: Secondary | ICD-10-CM | POA: Diagnosis not present

## 2021-09-20 DIAGNOSIS — K219 Gastro-esophageal reflux disease without esophagitis: Secondary | ICD-10-CM | POA: Diagnosis not present

## 2021-09-20 DIAGNOSIS — J449 Chronic obstructive pulmonary disease, unspecified: Secondary | ICD-10-CM | POA: Diagnosis not present

## 2021-09-20 DIAGNOSIS — J9611 Chronic respiratory failure with hypoxia: Secondary | ICD-10-CM | POA: Diagnosis not present

## 2021-09-24 DIAGNOSIS — J9611 Chronic respiratory failure with hypoxia: Secondary | ICD-10-CM | POA: Diagnosis not present

## 2021-09-24 DIAGNOSIS — Z7401 Bed confinement status: Secondary | ICD-10-CM | POA: Diagnosis not present

## 2021-09-24 DIAGNOSIS — K219 Gastro-esophageal reflux disease without esophagitis: Secondary | ICD-10-CM | POA: Diagnosis not present

## 2021-09-24 DIAGNOSIS — J449 Chronic obstructive pulmonary disease, unspecified: Secondary | ICD-10-CM | POA: Diagnosis not present

## 2021-09-25 DIAGNOSIS — L0390 Cellulitis, unspecified: Secondary | ICD-10-CM | POA: Diagnosis not present

## 2021-09-26 DIAGNOSIS — L02611 Cutaneous abscess of right foot: Secondary | ICD-10-CM | POA: Diagnosis not present

## 2021-09-26 DIAGNOSIS — L03032 Cellulitis of left toe: Secondary | ICD-10-CM | POA: Diagnosis not present

## 2021-09-27 DIAGNOSIS — K219 Gastro-esophageal reflux disease without esophagitis: Secondary | ICD-10-CM | POA: Diagnosis not present

## 2021-09-27 DIAGNOSIS — J9611 Chronic respiratory failure with hypoxia: Secondary | ICD-10-CM | POA: Diagnosis not present

## 2021-09-27 DIAGNOSIS — J449 Chronic obstructive pulmonary disease, unspecified: Secondary | ICD-10-CM | POA: Diagnosis not present

## 2021-09-27 DIAGNOSIS — F32A Depression, unspecified: Secondary | ICD-10-CM | POA: Diagnosis not present

## 2021-09-27 DIAGNOSIS — I129 Hypertensive chronic kidney disease with stage 1 through stage 4 chronic kidney disease, or unspecified chronic kidney disease: Secondary | ICD-10-CM | POA: Diagnosis not present

## 2021-09-27 DIAGNOSIS — N183 Chronic kidney disease, stage 3 unspecified: Secondary | ICD-10-CM | POA: Diagnosis not present

## 2021-10-01 DIAGNOSIS — K219 Gastro-esophageal reflux disease without esophagitis: Secondary | ICD-10-CM | POA: Diagnosis not present

## 2021-10-01 DIAGNOSIS — L409 Psoriasis, unspecified: Secondary | ICD-10-CM | POA: Diagnosis not present

## 2021-10-01 DIAGNOSIS — F419 Anxiety disorder, unspecified: Secondary | ICD-10-CM | POA: Diagnosis not present

## 2021-10-01 DIAGNOSIS — Z6825 Body mass index (BMI) 25.0-25.9, adult: Secondary | ICD-10-CM | POA: Diagnosis not present

## 2021-10-01 DIAGNOSIS — J449 Chronic obstructive pulmonary disease, unspecified: Secondary | ICD-10-CM | POA: Diagnosis not present

## 2021-10-01 DIAGNOSIS — F32A Depression, unspecified: Secondary | ICD-10-CM | POA: Diagnosis not present

## 2021-10-01 DIAGNOSIS — R32 Unspecified urinary incontinence: Secondary | ICD-10-CM | POA: Diagnosis not present

## 2021-10-01 DIAGNOSIS — I129 Hypertensive chronic kidney disease with stage 1 through stage 4 chronic kidney disease, or unspecified chronic kidney disease: Secondary | ICD-10-CM | POA: Diagnosis not present

## 2021-10-01 DIAGNOSIS — R159 Full incontinence of feces: Secondary | ICD-10-CM | POA: Diagnosis not present

## 2021-10-01 DIAGNOSIS — N183 Chronic kidney disease, stage 3 unspecified: Secondary | ICD-10-CM | POA: Diagnosis not present

## 2021-10-01 DIAGNOSIS — Z9981 Dependence on supplemental oxygen: Secondary | ICD-10-CM | POA: Diagnosis not present

## 2021-10-01 DIAGNOSIS — J9611 Chronic respiratory failure with hypoxia: Secondary | ICD-10-CM | POA: Diagnosis not present

## 2021-10-01 DIAGNOSIS — J302 Other seasonal allergic rhinitis: Secondary | ICD-10-CM | POA: Diagnosis not present

## 2021-10-03 DIAGNOSIS — J9611 Chronic respiratory failure with hypoxia: Secondary | ICD-10-CM | POA: Diagnosis not present

## 2021-10-03 DIAGNOSIS — J449 Chronic obstructive pulmonary disease, unspecified: Secondary | ICD-10-CM | POA: Diagnosis not present

## 2021-10-03 DIAGNOSIS — F32A Depression, unspecified: Secondary | ICD-10-CM | POA: Diagnosis not present

## 2021-10-03 DIAGNOSIS — K219 Gastro-esophageal reflux disease without esophagitis: Secondary | ICD-10-CM | POA: Diagnosis not present

## 2021-10-03 DIAGNOSIS — I129 Hypertensive chronic kidney disease with stage 1 through stage 4 chronic kidney disease, or unspecified chronic kidney disease: Secondary | ICD-10-CM | POA: Diagnosis not present

## 2021-10-03 DIAGNOSIS — N183 Chronic kidney disease, stage 3 unspecified: Secondary | ICD-10-CM | POA: Diagnosis not present

## 2021-10-04 DIAGNOSIS — I129 Hypertensive chronic kidney disease with stage 1 through stage 4 chronic kidney disease, or unspecified chronic kidney disease: Secondary | ICD-10-CM | POA: Diagnosis not present

## 2021-10-04 DIAGNOSIS — J449 Chronic obstructive pulmonary disease, unspecified: Secondary | ICD-10-CM | POA: Diagnosis not present

## 2021-10-04 DIAGNOSIS — K219 Gastro-esophageal reflux disease without esophagitis: Secondary | ICD-10-CM | POA: Diagnosis not present

## 2021-10-04 DIAGNOSIS — J9611 Chronic respiratory failure with hypoxia: Secondary | ICD-10-CM | POA: Diagnosis not present

## 2021-10-04 DIAGNOSIS — N183 Chronic kidney disease, stage 3 unspecified: Secondary | ICD-10-CM | POA: Diagnosis not present

## 2021-10-04 DIAGNOSIS — F32A Depression, unspecified: Secondary | ICD-10-CM | POA: Diagnosis not present

## 2021-10-10 DIAGNOSIS — I129 Hypertensive chronic kidney disease with stage 1 through stage 4 chronic kidney disease, or unspecified chronic kidney disease: Secondary | ICD-10-CM | POA: Diagnosis not present

## 2021-10-10 DIAGNOSIS — N183 Chronic kidney disease, stage 3 unspecified: Secondary | ICD-10-CM | POA: Diagnosis not present

## 2021-10-10 DIAGNOSIS — J9611 Chronic respiratory failure with hypoxia: Secondary | ICD-10-CM | POA: Diagnosis not present

## 2021-10-10 DIAGNOSIS — J449 Chronic obstructive pulmonary disease, unspecified: Secondary | ICD-10-CM | POA: Diagnosis not present

## 2021-10-10 DIAGNOSIS — F32A Depression, unspecified: Secondary | ICD-10-CM | POA: Diagnosis not present

## 2021-10-10 DIAGNOSIS — K219 Gastro-esophageal reflux disease without esophagitis: Secondary | ICD-10-CM | POA: Diagnosis not present

## 2021-10-17 DIAGNOSIS — N183 Chronic kidney disease, stage 3 unspecified: Secondary | ICD-10-CM | POA: Diagnosis not present

## 2021-10-17 DIAGNOSIS — J449 Chronic obstructive pulmonary disease, unspecified: Secondary | ICD-10-CM | POA: Diagnosis not present

## 2021-10-17 DIAGNOSIS — J9611 Chronic respiratory failure with hypoxia: Secondary | ICD-10-CM | POA: Diagnosis not present

## 2021-10-17 DIAGNOSIS — K219 Gastro-esophageal reflux disease without esophagitis: Secondary | ICD-10-CM | POA: Diagnosis not present

## 2021-10-17 DIAGNOSIS — I129 Hypertensive chronic kidney disease with stage 1 through stage 4 chronic kidney disease, or unspecified chronic kidney disease: Secondary | ICD-10-CM | POA: Diagnosis not present

## 2021-10-17 DIAGNOSIS — F32A Depression, unspecified: Secondary | ICD-10-CM | POA: Diagnosis not present

## 2021-10-23 DIAGNOSIS — N183 Chronic kidney disease, stage 3 unspecified: Secondary | ICD-10-CM | POA: Diagnosis not present

## 2021-10-23 DIAGNOSIS — F32A Depression, unspecified: Secondary | ICD-10-CM | POA: Diagnosis not present

## 2021-10-23 DIAGNOSIS — I129 Hypertensive chronic kidney disease with stage 1 through stage 4 chronic kidney disease, or unspecified chronic kidney disease: Secondary | ICD-10-CM | POA: Diagnosis not present

## 2021-10-23 DIAGNOSIS — J449 Chronic obstructive pulmonary disease, unspecified: Secondary | ICD-10-CM | POA: Diagnosis not present

## 2021-10-23 DIAGNOSIS — J9611 Chronic respiratory failure with hypoxia: Secondary | ICD-10-CM | POA: Diagnosis not present

## 2021-10-23 DIAGNOSIS — K219 Gastro-esophageal reflux disease without esophagitis: Secondary | ICD-10-CM | POA: Diagnosis not present

## 2021-10-31 DIAGNOSIS — J302 Other seasonal allergic rhinitis: Secondary | ICD-10-CM | POA: Diagnosis not present

## 2021-10-31 DIAGNOSIS — R159 Full incontinence of feces: Secondary | ICD-10-CM | POA: Diagnosis not present

## 2021-10-31 DIAGNOSIS — I129 Hypertensive chronic kidney disease with stage 1 through stage 4 chronic kidney disease, or unspecified chronic kidney disease: Secondary | ICD-10-CM | POA: Diagnosis not present

## 2021-10-31 DIAGNOSIS — K219 Gastro-esophageal reflux disease without esophagitis: Secondary | ICD-10-CM | POA: Diagnosis not present

## 2021-10-31 DIAGNOSIS — J449 Chronic obstructive pulmonary disease, unspecified: Secondary | ICD-10-CM | POA: Diagnosis not present

## 2021-10-31 DIAGNOSIS — R32 Unspecified urinary incontinence: Secondary | ICD-10-CM | POA: Diagnosis not present

## 2021-10-31 DIAGNOSIS — N183 Chronic kidney disease, stage 3 unspecified: Secondary | ICD-10-CM | POA: Diagnosis not present

## 2021-10-31 DIAGNOSIS — L409 Psoriasis, unspecified: Secondary | ICD-10-CM | POA: Diagnosis not present

## 2021-10-31 DIAGNOSIS — Z6825 Body mass index (BMI) 25.0-25.9, adult: Secondary | ICD-10-CM | POA: Diagnosis not present

## 2021-10-31 DIAGNOSIS — F419 Anxiety disorder, unspecified: Secondary | ICD-10-CM | POA: Diagnosis not present

## 2021-10-31 DIAGNOSIS — Z9981 Dependence on supplemental oxygen: Secondary | ICD-10-CM | POA: Diagnosis not present

## 2021-10-31 DIAGNOSIS — J9611 Chronic respiratory failure with hypoxia: Secondary | ICD-10-CM | POA: Diagnosis not present

## 2021-10-31 DIAGNOSIS — F32A Depression, unspecified: Secondary | ICD-10-CM | POA: Diagnosis not present

## 2021-11-01 DIAGNOSIS — J449 Chronic obstructive pulmonary disease, unspecified: Secondary | ICD-10-CM | POA: Diagnosis not present

## 2021-11-01 DIAGNOSIS — K219 Gastro-esophageal reflux disease without esophagitis: Secondary | ICD-10-CM | POA: Diagnosis not present

## 2021-11-01 DIAGNOSIS — J9611 Chronic respiratory failure with hypoxia: Secondary | ICD-10-CM | POA: Diagnosis not present

## 2021-11-01 DIAGNOSIS — N183 Chronic kidney disease, stage 3 unspecified: Secondary | ICD-10-CM | POA: Diagnosis not present

## 2021-11-01 DIAGNOSIS — F32A Depression, unspecified: Secondary | ICD-10-CM | POA: Diagnosis not present

## 2021-11-01 DIAGNOSIS — I129 Hypertensive chronic kidney disease with stage 1 through stage 4 chronic kidney disease, or unspecified chronic kidney disease: Secondary | ICD-10-CM | POA: Diagnosis not present

## 2021-11-04 DIAGNOSIS — F32A Depression, unspecified: Secondary | ICD-10-CM | POA: Diagnosis not present

## 2021-11-04 DIAGNOSIS — J449 Chronic obstructive pulmonary disease, unspecified: Secondary | ICD-10-CM | POA: Diagnosis not present

## 2021-11-04 DIAGNOSIS — J9611 Chronic respiratory failure with hypoxia: Secondary | ICD-10-CM | POA: Diagnosis not present

## 2021-11-04 DIAGNOSIS — I129 Hypertensive chronic kidney disease with stage 1 through stage 4 chronic kidney disease, or unspecified chronic kidney disease: Secondary | ICD-10-CM | POA: Diagnosis not present

## 2021-11-04 DIAGNOSIS — K219 Gastro-esophageal reflux disease without esophagitis: Secondary | ICD-10-CM | POA: Diagnosis not present

## 2021-11-04 DIAGNOSIS — N183 Chronic kidney disease, stage 3 unspecified: Secondary | ICD-10-CM | POA: Diagnosis not present

## 2021-11-08 DIAGNOSIS — J9611 Chronic respiratory failure with hypoxia: Secondary | ICD-10-CM | POA: Diagnosis not present

## 2021-11-08 DIAGNOSIS — K219 Gastro-esophageal reflux disease without esophagitis: Secondary | ICD-10-CM | POA: Diagnosis not present

## 2021-11-08 DIAGNOSIS — N183 Chronic kidney disease, stage 3 unspecified: Secondary | ICD-10-CM | POA: Diagnosis not present

## 2021-11-08 DIAGNOSIS — J449 Chronic obstructive pulmonary disease, unspecified: Secondary | ICD-10-CM | POA: Diagnosis not present

## 2021-11-08 DIAGNOSIS — F32A Depression, unspecified: Secondary | ICD-10-CM | POA: Diagnosis not present

## 2021-11-08 DIAGNOSIS — I129 Hypertensive chronic kidney disease with stage 1 through stage 4 chronic kidney disease, or unspecified chronic kidney disease: Secondary | ICD-10-CM | POA: Diagnosis not present

## 2021-11-13 DIAGNOSIS — J449 Chronic obstructive pulmonary disease, unspecified: Secondary | ICD-10-CM | POA: Diagnosis not present

## 2021-11-13 DIAGNOSIS — J9611 Chronic respiratory failure with hypoxia: Secondary | ICD-10-CM | POA: Diagnosis not present

## 2021-11-13 DIAGNOSIS — Z7401 Bed confinement status: Secondary | ICD-10-CM | POA: Diagnosis not present

## 2021-11-13 DIAGNOSIS — F0390 Unspecified dementia without behavioral disturbance: Secondary | ICD-10-CM | POA: Diagnosis not present

## 2021-11-15 DIAGNOSIS — J9611 Chronic respiratory failure with hypoxia: Secondary | ICD-10-CM | POA: Diagnosis not present

## 2021-11-15 DIAGNOSIS — N183 Chronic kidney disease, stage 3 unspecified: Secondary | ICD-10-CM | POA: Diagnosis not present

## 2021-11-15 DIAGNOSIS — I129 Hypertensive chronic kidney disease with stage 1 through stage 4 chronic kidney disease, or unspecified chronic kidney disease: Secondary | ICD-10-CM | POA: Diagnosis not present

## 2021-11-15 DIAGNOSIS — J449 Chronic obstructive pulmonary disease, unspecified: Secondary | ICD-10-CM | POA: Diagnosis not present

## 2021-11-15 DIAGNOSIS — K219 Gastro-esophageal reflux disease without esophagitis: Secondary | ICD-10-CM | POA: Diagnosis not present

## 2021-11-15 DIAGNOSIS — F32A Depression, unspecified: Secondary | ICD-10-CM | POA: Diagnosis not present

## 2021-11-21 DIAGNOSIS — J449 Chronic obstructive pulmonary disease, unspecified: Secondary | ICD-10-CM | POA: Diagnosis not present

## 2021-11-21 DIAGNOSIS — F32A Depression, unspecified: Secondary | ICD-10-CM | POA: Diagnosis not present

## 2021-11-21 DIAGNOSIS — I129 Hypertensive chronic kidney disease with stage 1 through stage 4 chronic kidney disease, or unspecified chronic kidney disease: Secondary | ICD-10-CM | POA: Diagnosis not present

## 2021-11-21 DIAGNOSIS — N183 Chronic kidney disease, stage 3 unspecified: Secondary | ICD-10-CM | POA: Diagnosis not present

## 2021-11-21 DIAGNOSIS — K219 Gastro-esophageal reflux disease without esophagitis: Secondary | ICD-10-CM | POA: Diagnosis not present

## 2021-11-21 DIAGNOSIS — J9611 Chronic respiratory failure with hypoxia: Secondary | ICD-10-CM | POA: Diagnosis not present

## 2021-11-27 DIAGNOSIS — F32A Depression, unspecified: Secondary | ICD-10-CM | POA: Diagnosis not present

## 2021-11-27 DIAGNOSIS — I129 Hypertensive chronic kidney disease with stage 1 through stage 4 chronic kidney disease, or unspecified chronic kidney disease: Secondary | ICD-10-CM | POA: Diagnosis not present

## 2021-11-27 DIAGNOSIS — J449 Chronic obstructive pulmonary disease, unspecified: Secondary | ICD-10-CM | POA: Diagnosis not present

## 2021-11-27 DIAGNOSIS — N183 Chronic kidney disease, stage 3 unspecified: Secondary | ICD-10-CM | POA: Diagnosis not present

## 2021-11-27 DIAGNOSIS — K219 Gastro-esophageal reflux disease without esophagitis: Secondary | ICD-10-CM | POA: Diagnosis not present

## 2021-11-27 DIAGNOSIS — J9611 Chronic respiratory failure with hypoxia: Secondary | ICD-10-CM | POA: Diagnosis not present

## 2021-12-01 DIAGNOSIS — Z9981 Dependence on supplemental oxygen: Secondary | ICD-10-CM | POA: Diagnosis not present

## 2021-12-01 DIAGNOSIS — J302 Other seasonal allergic rhinitis: Secondary | ICD-10-CM | POA: Diagnosis not present

## 2021-12-01 DIAGNOSIS — J449 Chronic obstructive pulmonary disease, unspecified: Secondary | ICD-10-CM | POA: Diagnosis not present

## 2021-12-01 DIAGNOSIS — R159 Full incontinence of feces: Secondary | ICD-10-CM | POA: Diagnosis not present

## 2021-12-01 DIAGNOSIS — K219 Gastro-esophageal reflux disease without esophagitis: Secondary | ICD-10-CM | POA: Diagnosis not present

## 2021-12-01 DIAGNOSIS — F419 Anxiety disorder, unspecified: Secondary | ICD-10-CM | POA: Diagnosis not present

## 2021-12-01 DIAGNOSIS — N183 Chronic kidney disease, stage 3 unspecified: Secondary | ICD-10-CM | POA: Diagnosis not present

## 2021-12-01 DIAGNOSIS — L409 Psoriasis, unspecified: Secondary | ICD-10-CM | POA: Diagnosis not present

## 2021-12-01 DIAGNOSIS — I129 Hypertensive chronic kidney disease with stage 1 through stage 4 chronic kidney disease, or unspecified chronic kidney disease: Secondary | ICD-10-CM | POA: Diagnosis not present

## 2021-12-01 DIAGNOSIS — Z6825 Body mass index (BMI) 25.0-25.9, adult: Secondary | ICD-10-CM | POA: Diagnosis not present

## 2021-12-01 DIAGNOSIS — J9611 Chronic respiratory failure with hypoxia: Secondary | ICD-10-CM | POA: Diagnosis not present

## 2021-12-01 DIAGNOSIS — F32A Depression, unspecified: Secondary | ICD-10-CM | POA: Diagnosis not present

## 2021-12-01 DIAGNOSIS — R32 Unspecified urinary incontinence: Secondary | ICD-10-CM | POA: Diagnosis not present

## 2021-12-04 ENCOUNTER — Other Ambulatory Visit: Payer: Self-pay | Admitting: Physician Assistant

## 2021-12-05 DIAGNOSIS — J449 Chronic obstructive pulmonary disease, unspecified: Secondary | ICD-10-CM | POA: Diagnosis not present

## 2021-12-05 DIAGNOSIS — F32A Depression, unspecified: Secondary | ICD-10-CM | POA: Diagnosis not present

## 2021-12-05 DIAGNOSIS — I129 Hypertensive chronic kidney disease with stage 1 through stage 4 chronic kidney disease, or unspecified chronic kidney disease: Secondary | ICD-10-CM | POA: Diagnosis not present

## 2021-12-05 DIAGNOSIS — J9611 Chronic respiratory failure with hypoxia: Secondary | ICD-10-CM | POA: Diagnosis not present

## 2021-12-05 DIAGNOSIS — K219 Gastro-esophageal reflux disease without esophagitis: Secondary | ICD-10-CM | POA: Diagnosis not present

## 2021-12-05 DIAGNOSIS — N183 Chronic kidney disease, stage 3 unspecified: Secondary | ICD-10-CM | POA: Diagnosis not present

## 2021-12-09 DIAGNOSIS — N183 Chronic kidney disease, stage 3 unspecified: Secondary | ICD-10-CM | POA: Diagnosis not present

## 2021-12-09 DIAGNOSIS — K219 Gastro-esophageal reflux disease without esophagitis: Secondary | ICD-10-CM | POA: Diagnosis not present

## 2021-12-09 DIAGNOSIS — I129 Hypertensive chronic kidney disease with stage 1 through stage 4 chronic kidney disease, or unspecified chronic kidney disease: Secondary | ICD-10-CM | POA: Diagnosis not present

## 2021-12-09 DIAGNOSIS — J449 Chronic obstructive pulmonary disease, unspecified: Secondary | ICD-10-CM | POA: Diagnosis not present

## 2021-12-09 DIAGNOSIS — J9611 Chronic respiratory failure with hypoxia: Secondary | ICD-10-CM | POA: Diagnosis not present

## 2021-12-09 DIAGNOSIS — F32A Depression, unspecified: Secondary | ICD-10-CM | POA: Diagnosis not present

## 2021-12-10 DIAGNOSIS — J9611 Chronic respiratory failure with hypoxia: Secondary | ICD-10-CM | POA: Diagnosis not present

## 2021-12-10 DIAGNOSIS — J449 Chronic obstructive pulmonary disease, unspecified: Secondary | ICD-10-CM | POA: Diagnosis not present

## 2021-12-10 DIAGNOSIS — K219 Gastro-esophageal reflux disease without esophagitis: Secondary | ICD-10-CM | POA: Diagnosis not present

## 2021-12-10 DIAGNOSIS — F32A Depression, unspecified: Secondary | ICD-10-CM | POA: Diagnosis not present

## 2021-12-10 DIAGNOSIS — N183 Chronic kidney disease, stage 3 unspecified: Secondary | ICD-10-CM | POA: Diagnosis not present

## 2021-12-10 DIAGNOSIS — I129 Hypertensive chronic kidney disease with stage 1 through stage 4 chronic kidney disease, or unspecified chronic kidney disease: Secondary | ICD-10-CM | POA: Diagnosis not present

## 2021-12-11 DIAGNOSIS — F32A Depression, unspecified: Secondary | ICD-10-CM | POA: Diagnosis not present

## 2021-12-11 DIAGNOSIS — J9611 Chronic respiratory failure with hypoxia: Secondary | ICD-10-CM | POA: Diagnosis not present

## 2021-12-11 DIAGNOSIS — J449 Chronic obstructive pulmonary disease, unspecified: Secondary | ICD-10-CM | POA: Diagnosis not present

## 2021-12-11 DIAGNOSIS — N183 Chronic kidney disease, stage 3 unspecified: Secondary | ICD-10-CM | POA: Diagnosis not present

## 2021-12-11 DIAGNOSIS — K219 Gastro-esophageal reflux disease without esophagitis: Secondary | ICD-10-CM | POA: Diagnosis not present

## 2021-12-11 DIAGNOSIS — I129 Hypertensive chronic kidney disease with stage 1 through stage 4 chronic kidney disease, or unspecified chronic kidney disease: Secondary | ICD-10-CM | POA: Diagnosis not present

## 2021-12-12 DIAGNOSIS — N183 Chronic kidney disease, stage 3 unspecified: Secondary | ICD-10-CM | POA: Diagnosis not present

## 2021-12-12 DIAGNOSIS — F32A Depression, unspecified: Secondary | ICD-10-CM | POA: Diagnosis not present

## 2021-12-12 DIAGNOSIS — J449 Chronic obstructive pulmonary disease, unspecified: Secondary | ICD-10-CM | POA: Diagnosis not present

## 2021-12-12 DIAGNOSIS — I129 Hypertensive chronic kidney disease with stage 1 through stage 4 chronic kidney disease, or unspecified chronic kidney disease: Secondary | ICD-10-CM | POA: Diagnosis not present

## 2021-12-12 DIAGNOSIS — K219 Gastro-esophageal reflux disease without esophagitis: Secondary | ICD-10-CM | POA: Diagnosis not present

## 2021-12-12 DIAGNOSIS — J9611 Chronic respiratory failure with hypoxia: Secondary | ICD-10-CM | POA: Diagnosis not present

## 2021-12-19 DIAGNOSIS — I129 Hypertensive chronic kidney disease with stage 1 through stage 4 chronic kidney disease, or unspecified chronic kidney disease: Secondary | ICD-10-CM | POA: Diagnosis not present

## 2021-12-19 DIAGNOSIS — J9611 Chronic respiratory failure with hypoxia: Secondary | ICD-10-CM | POA: Diagnosis not present

## 2021-12-19 DIAGNOSIS — K219 Gastro-esophageal reflux disease without esophagitis: Secondary | ICD-10-CM | POA: Diagnosis not present

## 2021-12-19 DIAGNOSIS — J449 Chronic obstructive pulmonary disease, unspecified: Secondary | ICD-10-CM | POA: Diagnosis not present

## 2021-12-19 DIAGNOSIS — N183 Chronic kidney disease, stage 3 unspecified: Secondary | ICD-10-CM | POA: Diagnosis not present

## 2021-12-19 DIAGNOSIS — F32A Depression, unspecified: Secondary | ICD-10-CM | POA: Diagnosis not present

## 2021-12-20 DIAGNOSIS — J449 Chronic obstructive pulmonary disease, unspecified: Secondary | ICD-10-CM | POA: Diagnosis not present

## 2021-12-20 DIAGNOSIS — F32A Depression, unspecified: Secondary | ICD-10-CM | POA: Diagnosis not present

## 2021-12-20 DIAGNOSIS — K219 Gastro-esophageal reflux disease without esophagitis: Secondary | ICD-10-CM | POA: Diagnosis not present

## 2021-12-20 DIAGNOSIS — I129 Hypertensive chronic kidney disease with stage 1 through stage 4 chronic kidney disease, or unspecified chronic kidney disease: Secondary | ICD-10-CM | POA: Diagnosis not present

## 2021-12-20 DIAGNOSIS — N183 Chronic kidney disease, stage 3 unspecified: Secondary | ICD-10-CM | POA: Diagnosis not present

## 2021-12-20 DIAGNOSIS — J9611 Chronic respiratory failure with hypoxia: Secondary | ICD-10-CM | POA: Diagnosis not present

## 2021-12-26 DIAGNOSIS — J449 Chronic obstructive pulmonary disease, unspecified: Secondary | ICD-10-CM | POA: Diagnosis not present

## 2021-12-26 DIAGNOSIS — J9611 Chronic respiratory failure with hypoxia: Secondary | ICD-10-CM | POA: Diagnosis not present

## 2021-12-26 DIAGNOSIS — K219 Gastro-esophageal reflux disease without esophagitis: Secondary | ICD-10-CM | POA: Diagnosis not present

## 2021-12-26 DIAGNOSIS — I129 Hypertensive chronic kidney disease with stage 1 through stage 4 chronic kidney disease, or unspecified chronic kidney disease: Secondary | ICD-10-CM | POA: Diagnosis not present

## 2021-12-26 DIAGNOSIS — F32A Depression, unspecified: Secondary | ICD-10-CM | POA: Diagnosis not present

## 2021-12-26 DIAGNOSIS — N183 Chronic kidney disease, stage 3 unspecified: Secondary | ICD-10-CM | POA: Diagnosis not present

## 2021-12-31 ENCOUNTER — Other Ambulatory Visit: Payer: Self-pay | Admitting: Allergy & Immunology

## 2021-12-31 DIAGNOSIS — J449 Chronic obstructive pulmonary disease, unspecified: Secondary | ICD-10-CM | POA: Diagnosis not present

## 2021-12-31 DIAGNOSIS — I129 Hypertensive chronic kidney disease with stage 1 through stage 4 chronic kidney disease, or unspecified chronic kidney disease: Secondary | ICD-10-CM | POA: Diagnosis not present

## 2021-12-31 DIAGNOSIS — N183 Chronic kidney disease, stage 3 unspecified: Secondary | ICD-10-CM | POA: Diagnosis not present

## 2021-12-31 DIAGNOSIS — F32A Depression, unspecified: Secondary | ICD-10-CM | POA: Diagnosis not present

## 2021-12-31 DIAGNOSIS — J9611 Chronic respiratory failure with hypoxia: Secondary | ICD-10-CM | POA: Diagnosis not present

## 2021-12-31 DIAGNOSIS — K219 Gastro-esophageal reflux disease without esophagitis: Secondary | ICD-10-CM | POA: Diagnosis not present

## 2022-01-01 DIAGNOSIS — L409 Psoriasis, unspecified: Secondary | ICD-10-CM | POA: Diagnosis not present

## 2022-01-01 DIAGNOSIS — F0394 Unspecified dementia, unspecified severity, with anxiety: Secondary | ICD-10-CM | POA: Diagnosis not present

## 2022-01-01 DIAGNOSIS — J9611 Chronic respiratory failure with hypoxia: Secondary | ICD-10-CM | POA: Diagnosis not present

## 2022-01-01 DIAGNOSIS — J449 Chronic obstructive pulmonary disease, unspecified: Secondary | ICD-10-CM | POA: Diagnosis not present

## 2022-01-01 DIAGNOSIS — R32 Unspecified urinary incontinence: Secondary | ICD-10-CM | POA: Diagnosis not present

## 2022-01-01 DIAGNOSIS — Z6825 Body mass index (BMI) 25.0-25.9, adult: Secondary | ICD-10-CM | POA: Diagnosis not present

## 2022-01-01 DIAGNOSIS — Z9981 Dependence on supplemental oxygen: Secondary | ICD-10-CM | POA: Diagnosis not present

## 2022-01-01 DIAGNOSIS — J302 Other seasonal allergic rhinitis: Secondary | ICD-10-CM | POA: Diagnosis not present

## 2022-01-01 DIAGNOSIS — I129 Hypertensive chronic kidney disease with stage 1 through stage 4 chronic kidney disease, or unspecified chronic kidney disease: Secondary | ICD-10-CM | POA: Diagnosis not present

## 2022-01-01 DIAGNOSIS — N183 Chronic kidney disease, stage 3 unspecified: Secondary | ICD-10-CM | POA: Diagnosis not present

## 2022-01-01 DIAGNOSIS — K219 Gastro-esophageal reflux disease without esophagitis: Secondary | ICD-10-CM | POA: Diagnosis not present

## 2022-01-01 DIAGNOSIS — R159 Full incontinence of feces: Secondary | ICD-10-CM | POA: Diagnosis not present

## 2022-01-01 DIAGNOSIS — F0393 Unspecified dementia, unspecified severity, with mood disturbance: Secondary | ICD-10-CM | POA: Diagnosis not present

## 2022-01-02 DIAGNOSIS — R32 Unspecified urinary incontinence: Secondary | ICD-10-CM | POA: Diagnosis not present

## 2022-01-02 DIAGNOSIS — K219 Gastro-esophageal reflux disease without esophagitis: Secondary | ICD-10-CM | POA: Diagnosis not present

## 2022-01-02 DIAGNOSIS — N183 Chronic kidney disease, stage 3 unspecified: Secondary | ICD-10-CM | POA: Diagnosis not present

## 2022-01-02 DIAGNOSIS — I129 Hypertensive chronic kidney disease with stage 1 through stage 4 chronic kidney disease, or unspecified chronic kidney disease: Secondary | ICD-10-CM | POA: Diagnosis not present

## 2022-01-02 DIAGNOSIS — J449 Chronic obstructive pulmonary disease, unspecified: Secondary | ICD-10-CM | POA: Diagnosis not present

## 2022-01-02 DIAGNOSIS — J9611 Chronic respiratory failure with hypoxia: Secondary | ICD-10-CM | POA: Diagnosis not present

## 2022-01-09 DIAGNOSIS — R32 Unspecified urinary incontinence: Secondary | ICD-10-CM | POA: Diagnosis not present

## 2022-01-09 DIAGNOSIS — K219 Gastro-esophageal reflux disease without esophagitis: Secondary | ICD-10-CM | POA: Diagnosis not present

## 2022-01-09 DIAGNOSIS — I129 Hypertensive chronic kidney disease with stage 1 through stage 4 chronic kidney disease, or unspecified chronic kidney disease: Secondary | ICD-10-CM | POA: Diagnosis not present

## 2022-01-09 DIAGNOSIS — J9611 Chronic respiratory failure with hypoxia: Secondary | ICD-10-CM | POA: Diagnosis not present

## 2022-01-09 DIAGNOSIS — J449 Chronic obstructive pulmonary disease, unspecified: Secondary | ICD-10-CM | POA: Diagnosis not present

## 2022-01-09 DIAGNOSIS — N183 Chronic kidney disease, stage 3 unspecified: Secondary | ICD-10-CM | POA: Diagnosis not present

## 2022-01-10 DIAGNOSIS — K219 Gastro-esophageal reflux disease without esophagitis: Secondary | ICD-10-CM | POA: Diagnosis not present

## 2022-01-10 DIAGNOSIS — N183 Chronic kidney disease, stage 3 unspecified: Secondary | ICD-10-CM | POA: Diagnosis not present

## 2022-01-10 DIAGNOSIS — R32 Unspecified urinary incontinence: Secondary | ICD-10-CM | POA: Diagnosis not present

## 2022-01-10 DIAGNOSIS — J449 Chronic obstructive pulmonary disease, unspecified: Secondary | ICD-10-CM | POA: Diagnosis not present

## 2022-01-10 DIAGNOSIS — J9611 Chronic respiratory failure with hypoxia: Secondary | ICD-10-CM | POA: Diagnosis not present

## 2022-01-10 DIAGNOSIS — I129 Hypertensive chronic kidney disease with stage 1 through stage 4 chronic kidney disease, or unspecified chronic kidney disease: Secondary | ICD-10-CM | POA: Diagnosis not present

## 2022-01-16 DIAGNOSIS — J9611 Chronic respiratory failure with hypoxia: Secondary | ICD-10-CM | POA: Diagnosis not present

## 2022-01-16 DIAGNOSIS — I129 Hypertensive chronic kidney disease with stage 1 through stage 4 chronic kidney disease, or unspecified chronic kidney disease: Secondary | ICD-10-CM | POA: Diagnosis not present

## 2022-01-16 DIAGNOSIS — K219 Gastro-esophageal reflux disease without esophagitis: Secondary | ICD-10-CM | POA: Diagnosis not present

## 2022-01-16 DIAGNOSIS — J449 Chronic obstructive pulmonary disease, unspecified: Secondary | ICD-10-CM | POA: Diagnosis not present

## 2022-01-16 DIAGNOSIS — R32 Unspecified urinary incontinence: Secondary | ICD-10-CM | POA: Diagnosis not present

## 2022-01-16 DIAGNOSIS — N183 Chronic kidney disease, stage 3 unspecified: Secondary | ICD-10-CM | POA: Diagnosis not present

## 2022-01-20 DIAGNOSIS — R32 Unspecified urinary incontinence: Secondary | ICD-10-CM | POA: Diagnosis not present

## 2022-01-20 DIAGNOSIS — J9611 Chronic respiratory failure with hypoxia: Secondary | ICD-10-CM | POA: Diagnosis not present

## 2022-01-20 DIAGNOSIS — I129 Hypertensive chronic kidney disease with stage 1 through stage 4 chronic kidney disease, or unspecified chronic kidney disease: Secondary | ICD-10-CM | POA: Diagnosis not present

## 2022-01-20 DIAGNOSIS — J449 Chronic obstructive pulmonary disease, unspecified: Secondary | ICD-10-CM | POA: Diagnosis not present

## 2022-01-20 DIAGNOSIS — N183 Chronic kidney disease, stage 3 unspecified: Secondary | ICD-10-CM | POA: Diagnosis not present

## 2022-01-20 DIAGNOSIS — K219 Gastro-esophageal reflux disease without esophagitis: Secondary | ICD-10-CM | POA: Diagnosis not present

## 2022-01-22 DIAGNOSIS — I129 Hypertensive chronic kidney disease with stage 1 through stage 4 chronic kidney disease, or unspecified chronic kidney disease: Secondary | ICD-10-CM | POA: Diagnosis not present

## 2022-01-22 DIAGNOSIS — J9611 Chronic respiratory failure with hypoxia: Secondary | ICD-10-CM | POA: Diagnosis not present

## 2022-01-22 DIAGNOSIS — K219 Gastro-esophageal reflux disease without esophagitis: Secondary | ICD-10-CM | POA: Diagnosis not present

## 2022-01-22 DIAGNOSIS — R32 Unspecified urinary incontinence: Secondary | ICD-10-CM | POA: Diagnosis not present

## 2022-01-22 DIAGNOSIS — N183 Chronic kidney disease, stage 3 unspecified: Secondary | ICD-10-CM | POA: Diagnosis not present

## 2022-01-22 DIAGNOSIS — J449 Chronic obstructive pulmonary disease, unspecified: Secondary | ICD-10-CM | POA: Diagnosis not present

## 2022-01-23 DIAGNOSIS — J449 Chronic obstructive pulmonary disease, unspecified: Secondary | ICD-10-CM | POA: Diagnosis not present

## 2022-01-23 DIAGNOSIS — K219 Gastro-esophageal reflux disease without esophagitis: Secondary | ICD-10-CM | POA: Diagnosis not present

## 2022-01-23 DIAGNOSIS — N183 Chronic kidney disease, stage 3 unspecified: Secondary | ICD-10-CM | POA: Diagnosis not present

## 2022-01-23 DIAGNOSIS — R32 Unspecified urinary incontinence: Secondary | ICD-10-CM | POA: Diagnosis not present

## 2022-01-23 DIAGNOSIS — I129 Hypertensive chronic kidney disease with stage 1 through stage 4 chronic kidney disease, or unspecified chronic kidney disease: Secondary | ICD-10-CM | POA: Diagnosis not present

## 2022-01-23 DIAGNOSIS — J9611 Chronic respiratory failure with hypoxia: Secondary | ICD-10-CM | POA: Diagnosis not present

## 2022-01-25 DIAGNOSIS — I509 Heart failure, unspecified: Secondary | ICD-10-CM | POA: Diagnosis not present

## 2022-01-25 DIAGNOSIS — F0390 Unspecified dementia without behavioral disturbance: Secondary | ICD-10-CM | POA: Diagnosis not present

## 2022-01-25 DIAGNOSIS — Z7401 Bed confinement status: Secondary | ICD-10-CM | POA: Diagnosis not present

## 2022-01-25 DIAGNOSIS — E785 Hyperlipidemia, unspecified: Secondary | ICD-10-CM | POA: Diagnosis not present

## 2022-01-26 ENCOUNTER — Emergency Department (HOSPITAL_COMMUNITY): Payer: Medicare Other

## 2022-01-26 ENCOUNTER — Other Ambulatory Visit: Payer: Self-pay

## 2022-01-26 ENCOUNTER — Inpatient Hospital Stay (HOSPITAL_COMMUNITY)
Admission: EM | Admit: 2022-01-26 | Discharge: 2022-01-30 | DRG: 871 | Disposition: A | Discharge status: Home Health | Payer: Medicare Other | Attending: Internal Medicine | Admitting: Internal Medicine

## 2022-01-26 ENCOUNTER — Encounter (HOSPITAL_COMMUNITY): Payer: Self-pay

## 2022-01-26 DIAGNOSIS — E876 Hypokalemia: Secondary | ICD-10-CM

## 2022-01-26 DIAGNOSIS — G9340 Encephalopathy, unspecified: Secondary | ICD-10-CM | POA: Diagnosis not present

## 2022-01-26 DIAGNOSIS — Z825 Family history of asthma and other chronic lower respiratory diseases: Secondary | ICD-10-CM | POA: Diagnosis not present

## 2022-01-26 DIAGNOSIS — M25519 Pain in unspecified shoulder: Secondary | ICD-10-CM | POA: Diagnosis not present

## 2022-01-26 DIAGNOSIS — I959 Hypotension, unspecified: Secondary | ICD-10-CM | POA: Diagnosis not present

## 2022-01-26 DIAGNOSIS — E860 Dehydration: Secondary | ICD-10-CM

## 2022-01-26 DIAGNOSIS — F0390 Unspecified dementia without behavioral disturbance: Secondary | ICD-10-CM

## 2022-01-26 DIAGNOSIS — E870 Hyperosmolality and hypernatremia: Secondary | ICD-10-CM | POA: Diagnosis present

## 2022-01-26 DIAGNOSIS — I130 Hypertensive heart and chronic kidney disease with heart failure and stage 1 through stage 4 chronic kidney disease, or unspecified chronic kidney disease: Secondary | ICD-10-CM | POA: Diagnosis present

## 2022-01-26 DIAGNOSIS — H269 Unspecified cataract: Secondary | ICD-10-CM | POA: Diagnosis present

## 2022-01-26 DIAGNOSIS — J189 Pneumonia, unspecified organism: Secondary | ICD-10-CM | POA: Diagnosis not present

## 2022-01-26 DIAGNOSIS — E785 Hyperlipidemia, unspecified: Secondary | ICD-10-CM | POA: Diagnosis present

## 2022-01-26 DIAGNOSIS — Z791 Long term (current) use of non-steroidal anti-inflammatories (NSAID): Secondary | ICD-10-CM | POA: Diagnosis not present

## 2022-01-26 DIAGNOSIS — R062 Wheezing: Secondary | ICD-10-CM | POA: Diagnosis not present

## 2022-01-26 DIAGNOSIS — Z7982 Long term (current) use of aspirin: Secondary | ICD-10-CM

## 2022-01-26 DIAGNOSIS — Z888 Allergy status to other drugs, medicaments and biological substances status: Secondary | ICD-10-CM

## 2022-01-26 DIAGNOSIS — R000 Tachycardia, unspecified: Secondary | ICD-10-CM | POA: Diagnosis not present

## 2022-01-26 DIAGNOSIS — N1832 Chronic kidney disease, stage 3b: Secondary | ICD-10-CM | POA: Diagnosis not present

## 2022-01-26 DIAGNOSIS — Z515 Encounter for palliative care: Secondary | ICD-10-CM | POA: Diagnosis not present

## 2022-01-26 DIAGNOSIS — R1310 Dysphagia, unspecified: Secondary | ICD-10-CM

## 2022-01-26 DIAGNOSIS — J441 Chronic obstructive pulmonary disease with (acute) exacerbation: Secondary | ICD-10-CM | POA: Diagnosis present

## 2022-01-26 DIAGNOSIS — Z833 Family history of diabetes mellitus: Secondary | ICD-10-CM

## 2022-01-26 DIAGNOSIS — Z8616 Personal history of COVID-19: Secondary | ICD-10-CM | POA: Diagnosis not present

## 2022-01-26 DIAGNOSIS — Z7951 Long term (current) use of inhaled steroids: Secondary | ICD-10-CM

## 2022-01-26 DIAGNOSIS — K219 Gastro-esophageal reflux disease without esophagitis: Secondary | ICD-10-CM | POA: Diagnosis present

## 2022-01-26 DIAGNOSIS — I5030 Unspecified diastolic (congestive) heart failure: Secondary | ICD-10-CM | POA: Diagnosis not present

## 2022-01-26 DIAGNOSIS — I5032 Chronic diastolic (congestive) heart failure: Secondary | ICD-10-CM | POA: Diagnosis present

## 2022-01-26 DIAGNOSIS — Z20822 Contact with and (suspected) exposure to covid-19: Secondary | ICD-10-CM | POA: Diagnosis present

## 2022-01-26 DIAGNOSIS — J690 Pneumonitis due to inhalation of food and vomit: Secondary | ICD-10-CM | POA: Diagnosis present

## 2022-01-26 DIAGNOSIS — G9341 Metabolic encephalopathy: Secondary | ICD-10-CM

## 2022-01-26 DIAGNOSIS — J9811 Atelectasis: Secondary | ICD-10-CM | POA: Diagnosis present

## 2022-01-26 DIAGNOSIS — Z87891 Personal history of nicotine dependence: Secondary | ICD-10-CM

## 2022-01-26 DIAGNOSIS — A419 Sepsis, unspecified organism: Principal | ICD-10-CM

## 2022-01-26 DIAGNOSIS — J9611 Chronic respiratory failure with hypoxia: Secondary | ICD-10-CM | POA: Diagnosis present

## 2022-01-26 DIAGNOSIS — G459 Transient cerebral ischemic attack, unspecified: Secondary | ICD-10-CM | POA: Diagnosis not present

## 2022-01-26 DIAGNOSIS — I10 Essential (primary) hypertension: Secondary | ICD-10-CM | POA: Diagnosis not present

## 2022-01-26 DIAGNOSIS — Z7401 Bed confinement status: Secondary | ICD-10-CM | POA: Diagnosis not present

## 2022-01-26 DIAGNOSIS — R4182 Altered mental status, unspecified: Secondary | ICD-10-CM | POA: Diagnosis not present

## 2022-01-26 DIAGNOSIS — Z79899 Other long term (current) drug therapy: Secondary | ICD-10-CM

## 2022-01-26 DIAGNOSIS — Z7189 Other specified counseling: Secondary | ICD-10-CM | POA: Diagnosis not present

## 2022-01-26 DIAGNOSIS — R059 Cough, unspecified: Secondary | ICD-10-CM | POA: Diagnosis not present

## 2022-01-26 DIAGNOSIS — Z8249 Family history of ischemic heart disease and other diseases of the circulatory system: Secondary | ICD-10-CM

## 2022-01-26 DIAGNOSIS — F419 Anxiety disorder, unspecified: Secondary | ICD-10-CM | POA: Diagnosis present

## 2022-01-26 DIAGNOSIS — E559 Vitamin D deficiency, unspecified: Secondary | ICD-10-CM | POA: Diagnosis present

## 2022-01-26 DIAGNOSIS — R509 Fever, unspecified: Secondary | ICD-10-CM | POA: Diagnosis not present

## 2022-01-26 LAB — LACTIC ACID, PLASMA: Lactic Acid, Venous: 1 mmol/L (ref 0.5–1.9)

## 2022-01-26 LAB — BLOOD GAS, VENOUS
Acid-Base Excess: 10.5 mmol/L — ABNORMAL HIGH (ref 0.0–2.0)
Bicarbonate: 37.8 mmol/L — ABNORMAL HIGH (ref 20.0–28.0)
O2 Saturation: 92.9 %
Patient temperature: 38.2
pCO2, Ven: 64 mmHg — ABNORMAL HIGH (ref 44–60)
pH, Ven: 7.38 (ref 7.25–7.43)
pO2, Ven: 66 mmHg — ABNORMAL HIGH (ref 32–45)

## 2022-01-26 LAB — URINALYSIS, ROUTINE W REFLEX MICROSCOPIC
Bacteria, UA: NONE SEEN
Bilirubin Urine: NEGATIVE
Glucose, UA: NEGATIVE mg/dL
Hgb urine dipstick: NEGATIVE
Ketones, ur: NEGATIVE mg/dL
Leukocytes,Ua: NEGATIVE
Nitrite: NEGATIVE
Protein, ur: >=300 mg/dL — AB
Specific Gravity, Urine: 1.026 (ref 1.005–1.030)
pH: 5 (ref 5.0–8.0)

## 2022-01-26 LAB — COMPREHENSIVE METABOLIC PANEL
ALT: 21 U/L (ref 0–44)
AST: 25 U/L (ref 15–41)
Albumin: 3.1 g/dL — ABNORMAL LOW (ref 3.5–5.0)
Alkaline Phosphatase: 45 U/L (ref 38–126)
Anion gap: 8 (ref 5–15)
BUN: 21 mg/dL (ref 8–23)
CO2: 33 mmol/L — ABNORMAL HIGH (ref 22–32)
Calcium: 9.1 mg/dL (ref 8.9–10.3)
Chloride: 109 mmol/L (ref 98–111)
Creatinine, Ser: 0.73 mg/dL (ref 0.44–1.00)
GFR, Estimated: >60 mL/min (ref >60)
Glucose, Bld: 145 mg/dL — ABNORMAL HIGH (ref 70–99)
Potassium: 3.2 mmol/L — ABNORMAL LOW (ref 3.5–5.1)
Sodium: 150 mmol/L — ABNORMAL HIGH (ref 135–145)
Total Bilirubin: 0.6 mg/dL (ref 0.3–1.2)
Total Protein: 6.4 g/dL — ABNORMAL LOW (ref 6.5–8.1)

## 2022-01-26 LAB — I-STAT CHEM 8, ED
BUN: 22 mg/dL (ref 8–23)
Calcium, Ion: 1.23 mmol/L (ref 1.15–1.40)
Chloride: 109 mmol/L (ref 98–111)
Creatinine, Ser: 0.8 mg/dL (ref 0.44–1.00)
Glucose, Bld: 141 mg/dL — ABNORMAL HIGH (ref 70–99)
HCT: 40 % (ref 36.0–46.0)
Hemoglobin: 13.6 g/dL (ref 12.0–15.0)
Potassium: 3.1 mmol/L — ABNORMAL LOW (ref 3.5–5.1)
Sodium: 152 mmol/L — ABNORMAL HIGH (ref 135–145)
TCO2: 36 mmol/L — ABNORMAL HIGH (ref 22–32)

## 2022-01-26 LAB — CBC WITH DIFFERENTIAL/PLATELET
Abs Immature Granulocytes: 0.04 10*3/uL (ref 0.00–0.07)
Basophils Absolute: 0 10*3/uL (ref 0.0–0.1)
Basophils Relative: 0 %
Eosinophils Absolute: 0 10*3/uL (ref 0.0–0.5)
Eosinophils Relative: 0 %
HCT: 43.5 % (ref 36.0–46.0)
Hemoglobin: 13.8 g/dL (ref 12.0–15.0)
Immature Granulocytes: 1 %
Lymphocytes Relative: 25 %
Lymphs Abs: 1.9 10*3/uL (ref 0.7–4.0)
MCH: 30.5 pg (ref 26.0–34.0)
MCHC: 31.7 g/dL (ref 30.0–36.0)
MCV: 96.2 fL (ref 80.0–100.0)
Monocytes Absolute: 0.3 10*3/uL (ref 0.1–1.0)
Monocytes Relative: 4 %
Neutro Abs: 5.3 10*3/uL (ref 1.7–7.7)
Neutrophils Relative %: 70 %
Platelets: 207 10*3/uL (ref 150–400)
RBC: 4.52 MIL/uL (ref 3.87–5.11)
RDW: 13.7 % (ref 11.5–15.5)
WBC: 7.5 10*3/uL (ref 4.0–10.5)
nRBC: 0 % (ref 0.0–0.2)

## 2022-01-26 LAB — BRAIN NATRIURETIC PEPTIDE: B Natriuretic Peptide: 550.5 pg/mL — ABNORMAL HIGH (ref 0.0–100.0)

## 2022-01-26 LAB — MAGNESIUM: Magnesium: 3 mg/dL — ABNORMAL HIGH (ref 1.7–2.4)

## 2022-01-26 LAB — RESP PANEL BY RT-PCR (FLU A&B, COVID) ARPGX2
Influenza A by PCR: NEGATIVE
Influenza B by PCR: NEGATIVE
SARS Coronavirus 2 by RT PCR: NEGATIVE

## 2022-01-26 LAB — PROTIME-INR
INR: 1.1 (ref 0.8–1.2)
Prothrombin Time: 13.8 seconds (ref 11.4–15.2)

## 2022-01-26 LAB — CBG MONITORING, ED: Glucose-Capillary: 148 mg/dL — ABNORMAL HIGH (ref 70–99)

## 2022-01-26 MED ORDER — SODIUM CHLORIDE 0.45 % IV SOLN: INTRAVENOUS | Status: DC

## 2022-01-26 MED ORDER — VALPROATE SODIUM 100 MG/ML IV SOLN
125.0000 mg | Freq: Two times a day (BID) | INTRAVENOUS | Status: DC
  Administered 2022-01-26 – 2022-01-30 (×8): 125 mg via INTRAVENOUS
  Filled 2022-01-26 (×10): qty 1.25

## 2022-01-26 MED ORDER — ASPIRIN 300 MG RE SUPP
300.0000 mg | Freq: Every day | RECTAL | Status: DC
  Administered 2022-01-26 – 2022-01-30 (×5): 300 mg via RECTAL
  Filled 2022-01-26 (×5): qty 1

## 2022-01-26 MED ORDER — OLANZAPINE 10 MG IM SOLR
2.5000 mg | Freq: Three times a day (TID) | INTRAMUSCULAR | Status: DC | PRN
  Filled 2022-01-26: qty 10

## 2022-01-26 MED ORDER — IPRATROPIUM-ALBUTEROL 0.5-2.5 (3) MG/3ML IN SOLN
3.0000 mL | Freq: Four times a day (QID) | RESPIRATORY_TRACT | Status: DC
  Administered 2022-01-26: 3 mL via RESPIRATORY_TRACT
  Filled 2022-01-26: qty 3

## 2022-01-26 MED ORDER — PIPERACILLIN-TAZOBACTAM 3.375 G IVPB 30 MIN
3.3750 g | Freq: Once | INTRAVENOUS | Status: AC
  Administered 2022-01-26: 3.375 g via INTRAVENOUS
  Filled 2022-01-26: qty 50

## 2022-01-26 MED ORDER — MAGNESIUM SULFATE 2 GM/50ML IV SOLN
2.0000 g | Freq: Once | INTRAVENOUS | Status: AC
  Administered 2022-01-26: 2 g via INTRAVENOUS
  Filled 2022-01-26: qty 50

## 2022-01-26 MED ORDER — POTASSIUM CHLORIDE 10 MEQ/100ML IV SOLN
10.0000 meq | INTRAVENOUS | Status: AC
  Administered 2022-01-26: 10 meq via INTRAVENOUS
  Filled 2022-01-26 (×2): qty 100

## 2022-01-26 MED ORDER — SODIUM CHLORIDE 0.9 % IV SOLN
100.0000 mg | Freq: Two times a day (BID) | INTRAVENOUS | Status: DC
  Administered 2022-01-27 – 2022-01-30 (×8): 100 mg via INTRAVENOUS
  Filled 2022-01-26 (×9): qty 100

## 2022-01-26 MED ORDER — FLUTICASONE PROPIONATE 50 MCG/ACT NA SUSP
1.0000 | Freq: Every day | NASAL | Status: DC
  Administered 2022-01-27 – 2022-01-30 (×5): 1 via NASAL
  Filled 2022-01-26 (×2): qty 16

## 2022-01-26 MED ORDER — ENOXAPARIN SODIUM 40 MG/0.4ML IJ SOSY
40.0000 mg | PREFILLED_SYRINGE | INTRAMUSCULAR | Status: DC
  Administered 2022-01-26 – 2022-01-29 (×4): 40 mg via SUBCUTANEOUS
  Filled 2022-01-26 (×5): qty 0.4

## 2022-01-26 MED ORDER — ACETAMINOPHEN 650 MG RE SUPP
650.0000 mg | Freq: Once | RECTAL | Status: AC
  Administered 2022-01-26: 650 mg via RECTAL
  Filled 2022-01-26: qty 1

## 2022-01-26 MED ORDER — METHYLPREDNISOLONE SODIUM SUCC 125 MG IJ SOLR
80.0000 mg | Freq: Every day | INTRAMUSCULAR | Status: AC
  Administered 2022-01-26 – 2022-01-27 (×2): 80 mg via INTRAVENOUS
  Filled 2022-01-26 (×2): qty 2

## 2022-01-26 MED ORDER — HYDRALAZINE HCL 20 MG/ML IJ SOLN
5.0000 mg | Freq: Four times a day (QID) | INTRAMUSCULAR | Status: DC | PRN
  Administered 2022-01-27 – 2022-01-30 (×3): 5 mg via INTRAVENOUS
  Filled 2022-01-26 (×3): qty 1

## 2022-01-26 MED ORDER — IPRATROPIUM-ALBUTEROL 0.5-2.5 (3) MG/3ML IN SOLN
3.0000 mL | Freq: Three times a day (TID) | RESPIRATORY_TRACT | Status: DC
  Administered 2022-01-27: 3 mL via RESPIRATORY_TRACT
  Filled 2022-01-26: qty 3

## 2022-01-26 MED ORDER — PIPERACILLIN-TAZOBACTAM 3.375 G IVPB
3.3750 g | Freq: Three times a day (TID) | INTRAVENOUS | Status: DC
  Administered 2022-01-27 – 2022-01-30 (×11): 3.375 g via INTRAVENOUS
  Filled 2022-01-26 (×13): qty 50

## 2022-01-26 MED ORDER — PANTOPRAZOLE SODIUM 40 MG IV SOLR
40.0000 mg | INTRAVENOUS | Status: DC
  Administered 2022-01-26 – 2022-01-29 (×4): 40 mg via INTRAVENOUS
  Filled 2022-01-26 (×4): qty 10

## 2022-01-26 MED ORDER — BUDESONIDE 0.5 MG/2ML IN SUSP
0.5000 mg | Freq: Two times a day (BID) | RESPIRATORY_TRACT | Status: DC
  Administered 2022-01-27 – 2022-01-30 (×7): 0.5 mg via RESPIRATORY_TRACT
  Filled 2022-01-26 (×7): qty 2

## 2022-01-26 MED ORDER — PIPERACILLIN-TAZOBACTAM 3.375 G IVPB: 3.3750 g | Freq: Three times a day (TID) | INTRAVENOUS | Status: DC

## 2022-01-26 MED ORDER — ACETAMINOPHEN 500 MG PO TABS: 1000.0000 mg | ORAL_TABLET | ORAL | Status: DC | PRN

## 2022-01-26 NOTE — ED Notes (Signed)
External catheter placed.

## 2022-01-26 NOTE — ED Notes (Signed)
In/out cath performed, brief changed and repositioned in bed all assisted by [place] NT

## 2022-01-26 NOTE — H&P (Signed)
History and Physical    Virginia [name] VHQ:[id number] DOB: 10-Feb-1936 DOA: 01/26/2022  PCP: Lauree Chandler, NP (Confirm with patient/family/NH records and if not entered, this has to be entered at [facility] point of entry) Patient coming from: Home  I have personally briefly reviewed patient's old medical records in [place]  Chief Complaint: Patient unable to talk for mentation changes  HPI: Virginia [name] is a 86 y.o. female with medical history significant of COPD Gold stage IV, chronic hypoxic respite failure on 2 L 24/7, chronic dysphagia on soft diet, advanced dementia, functional paraplegia and bedbound secondary to hip fractures, CKAD stage III, HTN sent for fever, increasing cough and mentation changes.  Patient is obtunded and all history provided by patient's brother at bedside and her son over phone. Patient was in hospice care at home since last year after her hip fracture which made her bed bound and worsening of dementia, but her condition has slowly improving and family took her off hospice and rescinded DNR to Full Code last Wednesday.  At baseline, patient need to be fed with soft diet and use straw to drink, " occasional cough" was reported by family in the last year but no aspiration pneumonia in more than 1 year. Caregivers including two sone and one brother at home with daily home nurse of 12 hours. And palliative care still follows.  Patient was tested positive for COVID late January (Daughter and son both tested positive at the same time) , but she did not receive any outpatient treatment at that time.  Since then patient has developed a cough which has been dry.  Starting this Monday, son noticed the patient started to have significant more cough, "sounds very gurgly when breathing", and her mentation has regularly deteriorated.  More sleepy and has not able to wake up to eat or drink for last 2 days. Then, patient started to have a fever yesterday and this morning  temperature 102.5 at home. Cough has bee dry. No diarrhea. No skin lesions as per family.  ED Course: Fever 101.7, blood pressure elevated, no tachycardia.  O2 saturation 95% on 2 L.  Chest x-ray no significant infiltrates other than the left CV angle blunting.  Na=152  Review of Systems: Unable to perform due to mentation changes.  Past Medical History:  Diagnosis Date   Anxiety 07/08/2017   Arthritis    Carotid artery disease ([place])    a. s/p L CEA. b. followed by [facility].   Chronic diastolic CHF (congestive heart failure) ([place])    CKD (chronic kidney disease), stage III ([place])    COPD (chronic obstructive pulmonary disease) ([place])    COPD GOLD IV 12/30/2014   PFTs 12/15/14 FEV1  0.57 (29%) ratio 54 p 17% resp to saba     Cough 01/31/2016   Dementia with behavioral disturbance 07/08/2017   Diverticulosis    Dyspnea    Essential hypertension 12/02/2014   Fall at home Sept. 2013   Family history of adverse reaction to anesthesia    SON IS SLOW TO WAKE    Gastroesophageal reflux disease 07/08/2017   Hyperlipidemia    Hypertension    Hyponatremia 12/02/2014   Hypotension 02/01/2017   Incontinent of urine    Internal hemorrhoid    Intertrochanteric fracture ([place]) 04/20/2019   Memory disorder 10/24/2014   Nausea and vomiting 11/30/2014   Palliative care status    Primary osteoarthritis involving multiple joints 07/08/2017   Vitamin D deficiency 02/15/2015  Past Surgical History:  Procedure Laterality Date   APPENDECTOMY     BREAST REDUCTION SURGERY     CAROTID ENDARTERECTOMY     left CEA   CATARACT EXTRACTION Bilateral    COLONOSCOPY  2015   EYE SURGERY     cataracts removed, bilaterally    INTRAMEDULLARY (IM) NAIL INTERTROCHANTERIC Right 04/21/2019   Procedure: INTRAMEDULLARY (IM) NAIL INTERTROCHANTRIC;  Surgeon: Rod Can, MD;  Location: [facility];  Service: Orthopedics;  Laterality: Right;   skin cancer removal     TONSILLECTOMY       reports that she quit smoking about 2 years  ago. Her smoking use included cigarettes. She has a 5.00 pack-year smoking history. She has never used smokeless tobacco. She reports that she does not currently use alcohol. She reports that she does not use drugs.  Allergies  Allergen Reactions   Lidocaine Anaphylaxis, Swelling, Rash and Other (See Comments)    Any of the " [facility] "   Other Other (See Comments)    All drugs that end in "cane'-  novacane etc. Daughter states patient almost died when she had it when she was born    Family History  Problem Relation Age of Onset   Heart disease Father        Before age 12   Hypertension Father    Heart attack Father    Cancer Mother 47       Brain   Dementia Brother    Cancer Maternal Aunt 61       breast cancer   Diabetes Maternal Aunt    High blood pressure Brother    Hypothyroidism Daughter    High blood pressure Son    High Cholesterol Son    Diabetes Son    COPD Son    Obesity Son    Heart failure Maternal Grandmother    Diabetes Maternal Grandmother    Stroke Neg Hx      Prior to Admission medications   Medication Sig Start Date End Date Taking? Authorizing Provider  acetaminophen (TYLENOL) 500 MG tablet Take 1,000 mg by mouth in the morning and at bedtime.     [provider]  aspirin EC 81 MG tablet Take 81 mg by mouth in the morning and at bedtime.     [provider]  budesonide (PULMICORT) 0.5 MG/2ML nebulizer solution Take 2 mLs (0.5 mg total) by nebulization 2 (two) times daily. 09/14/20   Valentina Shaggy, MD  Carbinoxamine Maleate 4 MG TABS TAKE 1 TABLET IN MORNING AND 1 AT BEDTIME 12/31/21   Valentina Shaggy, MD  clobetasol cream (TEMOVATE) 0.05 % Apply topically 2 (two) times daily. 07/25/20   [provider]  diclofenac Sodium (VOLTAREN) 1 % GEL Apply 2 g topically in the morning and at bedtime.    [provider]  divalproex (DEPAKOTE SPRINKLE) 125 MG capsule Take 1 capsule (125 mg total) by mouth 2 (two)  times daily. 01/23/21   Suzzanne Cloud, NP  donepezil (ARICEPT) 10 MG tablet Take 1 tablet (10 mg total) by mouth daily. Call to schedule follow up for ongoing refills [phone] 05/20/21   Suzzanne Cloud, NP  fluticasone Asencion Islam) 50 MCG/ACT nasal spray Use 1 spray each nostril once a day as needed for stuffy nose 03/25/21   Althea Charon, FNP  furosemide (LASIX) 20 MG tablet Take 1 tablet (20 mg total) by mouth daily. Please make overdue appt with Dr. Harrington Challenger before anymore refills. Thank you  1st attempt 12/05/21   Fay Records, MD  ipratropium-albuterol (DUONEB) 0.5-2.5 (3) MG/3ML SOLN USE 1 VIAL IN NEBULIZER EVERY 6 HOURS - and as needed 07/24/20   Lauree Chandler, NP  memantine (NAMENDA) 10 MG tablet Take 1 tablet (10 mg total) by mouth 2 (two) times daily. 06/10/21   Suzzanne Cloud, NP  ondansetron (ZOFRAN-ODT) 4 MG disintegrating tablet Take 4 mg by mouth as needed for nausea or vomiting.    [provider]  pantoprazole (PROTONIX) 40 MG tablet TAKE 1 TABLET(40 MG) BY MOUTH TWICE DAILY 01/21/21   Lauree Chandler, NP  propranolol (INDERAL) 20 MG tablet TAKE 1 TABLET(20 MG) BY MOUTH TWICE DAILY 03/21/21   Bhagat, Bhavinkumar, PA  QUEtiapine (SEROQUEL) 25 MG tablet Take 1 tablet (25 mg total) by mouth at bedtime. Please call [phone] to schedule follow up appt. 07/22/21   Kathrynn Ducking, MD  rosuvastatin (CRESTOR) 20 MG tablet Take 1 tablet (20 mg total) by mouth daily. Please make overdue appt with Dr. Harrington Challenger before anymore refills. Thank you 1st attempt 12/05/21   Fay Records, MD  sertraline (ZOLOFT) 50 MG tablet Take 2 tablets (100 mg total) by mouth daily. 06/12/21   Suzzanne Cloud, NP    Physical Exam: Vitals:   01/26/22 1600 01/26/22 1615 01/26/22 1625 01/26/22 1700  BP: (!) 169/86 (!) 160/87  (!) 175/64  Pulse: 83 96 100 93  Resp: 16  18 20   Temp:      TempSrc:      SpO2: 97% 97% 98% 95%  Weight:      Height:        Constitutional: NAD, calm, comfortable Vitals:    01/26/22 1600 01/26/22 1615 01/26/22 1625 01/26/22 1700  BP: (!) 169/86 (!) 160/87  (!) 175/64  Pulse: 83 96 100 93  Resp: 16  18 20   Temp:      TempSrc:      SpO2: 97% 97% 98% 95%  Weight:      Height:       Eyes: PERRL, lids and conjunctivae normal ENMT: Mucous membranes are dry. Posterior pharynx clear of any exudate or lesions.Normal dentition.  Neck: normal, supple, no masses, no thyromegaly Respiratory: Congested bilaterally, diffuse wheezing, lot of coarse crackles on B/L lungs. Increasing respiratory effort. No accessory muscle use.  Cardiovascular: Regular rate and rhythm, no murmurs / rubs / gallops. No extremity edema. 2+ pedal pulses. No carotid bruits.  Abdomen: no tenderness, no masses palpated. No hepatosplenomegaly. Bowel sounds positive.  Musculoskeletal: no clubbing / cyanosis. No joint deformity upper and lower extremities. Good ROM, no contractures. Normal muscle tone.  Skin: no rashes, lesions, ulcers. No induration Neurologic: Open eyes to physical stimuli, moving all limbs Psychiatric: Open eyes to physical stimuli   Labs on Admission: I have personally reviewed following labs and imaging studies  CBC: Recent Labs  Lab 01/26/22 1317  HGB 13.6  HCT 58.0   Basic Metabolic Panel: Recent Labs  Lab 01/26/22 1308 01/26/22 1317  NA 150* 152*  K 3.2* 3.1*  CL 109 109  CO2 33*  --   GLUCOSE 145* 141*  BUN 21 22  CREATININE 0.73 0.80  CALCIUM 9.1  --    GFR: Estimated Creatinine Clearance: 46.7 mL/min (by C-G formula based on SCr of 0.8 mg/dL). Liver Function Tests: Recent Labs  Lab 01/26/22 1308  AST 25  ALT 21  ALKPHOS 45  BILITOT 0.6  PROT 6.4*  ALBUMIN 3.1*   No  results for input(s): LIPASE, AMYLASE in the last 168 hours. No results for input(s): AMMONIA in the last 168 hours. Coagulation Profile: Recent Labs  Lab 01/26/22 1308  INR 1.1   Cardiac Enzymes: No results for input(s): CKTOTAL, CKMB, CKMBINDEX, TROPONINI in the last 168  hours. BNP (last 3 results) No results for input(s): PROBNP in the last 8760 hours. HbA1C: No results for input(s): HGBA1C in the last 72 hours. CBG: Recent Labs  Lab 01/26/22 1308  GLUCAP 148*   Lipid Profile: No results for input(s): CHOL, HDL, LDLCALC, TRIG, CHOLHDL, LDLDIRECT in the last 72 hours. Thyroid Function Tests: No results for input(s): TSH, T4TOTAL, FREET4, T3FREE, THYROIDAB in the last 72 hours. Anemia Panel: No results for input(s): VITAMINB12, FOLATE, FERRITIN, TIBC, IRON, RETICCTPCT in the last 72 hours. Urine analysis:    Component Value Date/Time   COLORURINE YELLOW 01/26/2022 1308   APPEARANCEUR CLEAR 01/26/2022 1308   LABSPEC 1.026 01/26/2022 1308   PHURINE 5.0 01/26/2022 1308   GLUCOSEU NEGATIVE 01/26/2022 1308   HGBUR NEGATIVE 01/26/2022 1308   [place] 01/26/2022 1308   BILIRUBINUR neg 12/29/2017 1439   KETONESUR NEGATIVE 01/26/2022 1308   PROTEINUR >=300 (A) 01/26/2022 1308   UROBILINOGEN 0.2 06/14/2018 1537   NITRITE NEGATIVE 01/26/2022 1308   LEUKOCYTESUR NEGATIVE 01/26/2022 1308    Radiological Exams on Admission: CT Head Wo Contrast  Result Date: 01/26/2022 CLINICAL DATA:  Altered mental status EXAM: CT HEAD WITHOUT CONTRAST TECHNIQUE: Contiguous axial images were obtained from the base of the skull through the vertex without intravenous contrast. RADIATION DOSE REDUCTION: This exam was performed according to the departmental dose-optimization program which includes automated exposure control, adjustment of the mA and/or kV according to patient size and/or use of iterative reconstruction technique. COMPARISON:  06/08/2020 FINDINGS: Brain: No acute intracranial findings are seen. There are no signs of bleeding. There is prominence of third and both lateral ventricles. Cortical sulci are prominent. There is decreased density in the subcortical and periventricular white matter in both cerebral hemispheres. Overall, no significant interval  changes are noted. Vascular: Unremarkable. Skull: Unremarkable. Sinuses/Orbits: There is mucosal thickening in the ethmoid and maxillary sinuses. There is fluid density in the left mastoid air cells. Other: None IMPRESSION: No acute intracranial findings are seen in noncontrast CT brain. Atrophy. Small-vessel disease. Chronic ethmoid and maxillary sinusitis. There is left mastoid effusion. Electronically Signed   By: Elmer Picker M.D.   On: 01/26/2022 17:00   DG Chest Portable 1 View  Result Date: 01/26/2022 CLINICAL DATA:  Cough.  Altered mental status.  Sepsis. EXAM: PORTABLE CHEST 1 VIEW COMPARISON:  06/08/2020 FINDINGS: Heart size is stable, accentuated by technique. There is atherosclerotic calcification of the thoracic aorta. Persistent opacity at the LEFT costophrenic angle is similar to multiple prior studies. There is minimal subsegmental atelectasis in the RIGHT lung base. There is perihilar peribronchial thickening, stable. IMPRESSION: Stable appearance of LEFT costophrenic angle blunting. Subsegmental atelectasis in the RIGHT lung base. Electronically Signed   By: Nolon Nations M.D.   On: 01/26/2022 14:06    EKG: Independently reviewed. Sinus, prolonged QTC  Assessment/Plan Principal Problem:   Pneumonia Active Problems:   Community acquired pneumonia  (please populate well all problems here in Problem List. (For example, if patient is on BP meds at home and you resume or decide to hold them, it is a problem that needs to be her. Same for CAD, COPD, HLD and so on)  Sepsis -Evidenced by new onset of fever, increasing  breathing effort and tachypnea, signs of endorgan damage of new onset of Encephalopathy. -Infection source likely bacterial PNA, aspiration vs CAP/post viral? -Agreed with expanding coverage of Zosyn to cover both CAP and aspiration, add atypical coverage given no significant infiltrates on x-ray.  Check sputum culture if any, atypical study Legionella and  mycoplasma. -Discussion with patient son and brother regarding patient CODE STATUS.  Both expressed wish for patient to remain on Full Code unless non-reversible conditions happen. Full Code for now. -Aspiration precaution and HOB 30 degree. -Other Ddx, her BP significantly elevated, but clinically no signs of fluid overload, most recent echo was done 2 years ago, echo ordered. Hold diuresis today and repeat Xray in AM.  Acute metabolic encephalopathy, GCS=8 -As of now, sufficiently protecting airway -Secondary to sepsis and hypernatremia -Sepsis treatment as above, on half-normal saline to correct hypernatremia.  Repeat BMP and chest x-ray for volume and sodium monitoring for tomorrow.  COPD exacerbation, acute, Gold stage IV -Likely triggered by pneumonia, breathing treatment, short course of IV steroid. -Incentive spirometry if possible to use  Hypokalemia -IV replacement x5, check magnesium level, magnesium 2 g given -Recheck level tomorrow  Acute on chronic dysphagia -Hold of all PO meds, NPO for mentation chang, starting GI prophylaxis with PPI -Speech eva tomorrow  HTN, uncontrolled -PRN Hydralazine for now.  Advanced dementia -As needed Zyprexa  CKD stage IIIb -Cre stable, now signs of hypovolemia 2/2 poor intake, on IVF  DVT prophylaxis: Lovenox Code Status: Full code Family Communication: Son and brother Disposition Plan: Patient is sick with sepsis, multiple other active problem including COPD, mentation changes, expect more than 2 midnight hospital stay. Consults called: Palliative Admission status: PCU   Lequita Halt MD [facility] Pager [phone]  01/26/2022, 6:00 PM

## 2022-01-26 NOTE — ED Triage Notes (Signed)
Patient from home recently released from hospice last Wednesday but has had steady decline.  Patient was 91% on 2L [place] at home EMS reports expiratory wheezes. Patient has AMS and will respond to pain and noxious stimuli

## 2022-01-26 NOTE — Progress Notes (Signed)
Pharmacy Antibiotic Note  Virginia [name] is a 86 y.o. female admitted on 01/26/2022 with pneumonia.  Non-verbal at baseline and presenting from home.One dose of zosyn given in ED and doxycycline IV 100mg  BID per MD. Tmax 101.1F and fevering x 2 days.  Pharmacy has been consulted for zosyn dosing.  Plan: Zosyn 3.375g q8h  F/u renal function, clinical status and length of therapy  Height: 5\' 2"  (157.5 cm) Weight: 63.5 kg (140 lb) IBW/kg (Calculated) : 50.1  Temp (24hrs), Avg:101.7 F (38.7 C), Min:101.7 F (38.7 C), Max:101.7 F (38.7 C)  Recent Labs  Lab 01/26/22 1308 01/26/22 1317  CREATININE 0.73 0.80  LATICACIDVEN 1.0  --     Estimated Creatinine Clearance: 46.7 mL/min (by C-G formula based on SCr of 0.8 mg/dL).    Allergies  Allergen Reactions   Lidocaine Anaphylaxis, Swelling, Rash and Other (See Comments)    Any of the " [facility] "   Other Other (See Comments)    All drugs that end in "cane'-  novacane etc. Daughter states patient almost died when she had it when she was born    Antimicrobials this admission: Doxy 2/26> Pip/tazo 2/26>  Dose adjustments this admission:  Microbiology results: 2/26 BCx: pending 2/26 MRSA PCR: pending  Thank you for allowing pharmacy to be a part of this patients care.  Levonne Spiller 01/26/2022 6:17 PM

## 2022-01-26 NOTE — ED Provider Notes (Signed)
[facility] EMERGENCY DEPARTMENT Provider Note   CSN: [id number] Arrival date & time: 01/26/22  1256     History  Chief Complaint  Patient presents with   Code Sepsis    Virginia [name] is a 86 y.o. female.  Patient with history of dementia, nonambulatory and nonverbal at baseline, presents from home with chief complaint of decreased activity and fever today.  Family states that she had been on hospice for several months and ultimately was taken off of hospice as she continued to do well.  She is now in care of palliative medicine, CODE STATUS is full.  She was noted to have decreased activity and fever for 2 days now and brought to the ER.  No reports of cough no vomiting no diarrhea reported.  Patient is otherwise bedbound, family feeds her by bringing a cup with a straw to her lips and the patient is able to drink out of a straw, however she has not been able to do so in the last 2 days.      Home Medications Prior to Admission medications   Medication Sig Start Date End Date Taking? Authorizing Provider  acetaminophen (TYLENOL) 500 MG tablet Take 1,000 mg by mouth in the morning and at bedtime.     [provider]  aspirin EC 81 MG tablet Take 81 mg by mouth in the morning and at bedtime.     [provider]  budesonide (PULMICORT) 0.5 MG/2ML nebulizer solution Take 2 mLs (0.5 mg total) by nebulization 2 (two) times daily. 09/14/20   Valentina Shaggy, MD  Carbinoxamine Maleate 4 MG TABS TAKE 1 TABLET IN MORNING AND 1 AT BEDTIME 12/31/21   Valentina Shaggy, MD  clobetasol cream (TEMOVATE) 0.05 % Apply topically 2 (two) times daily. 07/25/20   [provider]  diclofenac Sodium (VOLTAREN) 1 % GEL Apply 2 g topically in the morning and at bedtime.    [provider]  divalproex (DEPAKOTE SPRINKLE) 125 MG capsule Take 1 capsule (125 mg total) by mouth 2 (two) times daily. 01/23/21   Suzzanne Cloud, NP  donepezil (ARICEPT) 10  MG tablet Take 1 tablet (10 mg total) by mouth daily. Call to schedule follow up for ongoing refills [phone] 05/20/21   Suzzanne Cloud, NP  fluticasone Asencion Islam) 50 MCG/ACT nasal spray Use 1 spray each nostril once a day as needed for stuffy nose 03/25/21   Althea Charon, FNP  furosemide (LASIX) 20 MG tablet Take 1 tablet (20 mg total) by mouth daily. Please make overdue appt with Dr. Harrington Challenger before anymore refills. Thank you 1st attempt 12/05/21   Fay Records, MD  ipratropium-albuterol (DUONEB) 0.5-2.5 (3) MG/3ML SOLN USE 1 VIAL IN NEBULIZER EVERY 6 HOURS - and as needed 07/24/20   Lauree Chandler, NP  memantine (NAMENDA) 10 MG tablet Take 1 tablet (10 mg total) by mouth 2 (two) times daily. 06/10/21   Suzzanne Cloud, NP  ondansetron (ZOFRAN-ODT) 4 MG disintegrating tablet Take 4 mg by mouth as needed for nausea or vomiting.    [provider]  pantoprazole (PROTONIX) 40 MG tablet TAKE 1 TABLET(40 MG) BY MOUTH TWICE DAILY 01/21/21   Lauree Chandler, NP  propranolol (INDERAL) 20 MG tablet TAKE 1 TABLET(20 MG) BY MOUTH TWICE DAILY 03/21/21   Bhagat, Bhavinkumar, PA  QUEtiapine (SEROQUEL) 25 MG tablet Take 1 tablet (25 mg total) by mouth at bedtime. Please call [phone] to schedule follow up appt. 07/22/21  Kathrynn Ducking, MD  rosuvastatin (CRESTOR) 20 MG tablet Take 1 tablet (20 mg total) by mouth daily. Please make overdue appt with Dr. Harrington Challenger before anymore refills. Thank you 1st attempt 12/05/21   Fay Records, MD  sertraline (ZOLOFT) 50 MG tablet Take 2 tablets (100 mg total) by mouth daily. 06/12/21   Suzzanne Cloud, NP      Allergies    Lidocaine and Other    Review of Systems   Review of Systems  Unable to perform ROS: Patient nonverbal   Physical Exam Updated Vital Signs BP (!) 175/64    Pulse 93    Temp (!) 101.7 F (38.7 C) (Rectal)    Resp 20    Ht 5\' 2"  (1.575 m)    Wt 63.5 kg    LMP  (LMP Unknown)    SpO2 95%    BMI 25.61 kg/m  Physical Exam Constitutional:       Comments: Patient is somnolent, she does localize to pain but appears nonverbal, eyes closed.  HENT:     Head: Normocephalic.     Nose: Nose normal.  Eyes:     Conjunctiva/sclera: Conjunctivae normal.  Cardiovascular:     Rate and Rhythm: Normal rate.  Pulmonary:     Effort: Pulmonary effort is normal.  Musculoskeletal:        General: Normal range of motion.  Neurological:     Comments: Appears somnolent, obtunded, localizes to pain though.  No vocalization noted.  Does not follow commands.  Eyes remain closed.    ED Results / Procedures / Treatments   Labs (all labs ordered are listed, but only abnormal results are displayed) Labs Reviewed  URINALYSIS, ROUTINE W REFLEX MICROSCOPIC - Abnormal; Notable for the following components:      Result Value   Protein, ur >=300 (*)    All other components within normal limits  BRAIN NATRIURETIC PEPTIDE - Abnormal; Notable for the following components:   B Natriuretic Peptide 550.5 (*)    All other components within normal limits  COMPREHENSIVE METABOLIC PANEL - Abnormal; Notable for the following components:   Sodium 150 (*)    Potassium 3.2 (*)    CO2 33 (*)    Glucose, Bld 145 (*)    Total Protein 6.4 (*)    Albumin 3.1 (*)    All other components within normal limits  I-STAT CHEM 8, ED - Abnormal; Notable for the following components:   Sodium 152 (*)    Potassium 3.1 (*)    Glucose, Bld 141 (*)    TCO2 36 (*)    All other components within normal limits  CBG MONITORING, ED - Abnormal; Notable for the following components:   Glucose-Capillary 148 (*)    All other components within normal limits  RESP PANEL BY RT-PCR (FLU A&B, COVID) ARPGX2  CULTURE, BLOOD (ROUTINE X 2)  CULTURE, BLOOD (ROUTINE X 2)  LACTIC ACID, PLASMA  PROTIME-INR  CBC WITH DIFFERENTIAL/PLATELET  CBC WITH DIFFERENTIAL/PLATELET    EKG EKG Interpretation  Date/Time:  Sunday January 26 2022 13:06:52 EST Ventricular Rate:  104 PR Interval:  167 QRS  Duration: 65 QT Interval:  370 QTC Calculation: 487 R Axis:   83 Text Interpretation: Sinus tachycardia Consider left atrial enlargement Borderline right axis deviation Borderline T abnormalities, anterior leads Borderline prolonged QT interval Confirmed by Thamas Jaegers ([id number]) on 01/26/2022 1:53:02 PM  Radiology CT Head Wo Contrast  Result Date: 01/26/2022 CLINICAL DATA:  Altered mental status EXAM:  CT HEAD WITHOUT CONTRAST TECHNIQUE: Contiguous axial images were obtained from the base of the skull through the vertex without intravenous contrast. RADIATION DOSE REDUCTION: This exam was performed according to the departmental dose-optimization program which includes automated exposure control, adjustment of the mA and/or kV according to patient size and/or use of iterative reconstruction technique. COMPARISON:  06/08/2020 FINDINGS: Brain: No acute intracranial findings are seen. There are no signs of bleeding. There is prominence of third and both lateral ventricles. Cortical sulci are prominent. There is decreased density in the subcortical and periventricular white matter in both cerebral hemispheres. Overall, no significant interval changes are noted. Vascular: Unremarkable. Skull: Unremarkable. Sinuses/Orbits: There is mucosal thickening in the ethmoid and maxillary sinuses. There is fluid density in the left mastoid air cells. Other: None IMPRESSION: No acute intracranial findings are seen in noncontrast CT brain. Atrophy. Small-vessel disease. Chronic ethmoid and maxillary sinusitis. There is left mastoid effusion. Electronically Signed   By: Elmer Picker M.D.   On: 01/26/2022 17:00   DG Chest Portable 1 View  Result Date: 01/26/2022 CLINICAL DATA:  Cough.  Altered mental status.  Sepsis. EXAM: PORTABLE CHEST 1 VIEW COMPARISON:  06/08/2020 FINDINGS: Heart size is stable, accentuated by technique. There is atherosclerotic calcification of the thoracic aorta. Persistent opacity at the LEFT  costophrenic angle is similar to multiple prior studies. There is minimal subsegmental atelectasis in the RIGHT lung base. There is perihilar peribronchial thickening, stable. IMPRESSION: Stable appearance of LEFT costophrenic angle blunting. Subsegmental atelectasis in the RIGHT lung base. Electronically Signed   By: Nolon Nations M.D.   On: 01/26/2022 14:06    Procedures .Critical Care Performed by: Luna Fuse, MD Authorized by: Luna Fuse, MD   Critical care provider statement:    Critical care time (minutes):  40   Critical care time was exclusive of:  Separately billable procedures and treating other patients and teaching time   Critical care was necessary to treat or prevent imminent or life-threatening deterioration of the following conditions:  Sepsis and CNS failure or compromise    Medications Ordered in ED Medications  0.45 % sodium chloride infusion ( Intravenous New Bag/Given 01/26/22 1547)  piperacillin-tazobactam (ZOSYN) IVPB 3.375 g (has no administration in time range)  acetaminophen (TYLENOL) suppository 650 mg (650 mg Rectal Given 01/26/22 1323)    ED Course/ Medical Decision Making/ A&P                           Medical Decision Making Amount and/or Complexity of Data Reviewed Labs: ordered. Radiology: ordered.  Risk OTC drugs. Prescription drug management.   Patient placed on telemetry, showing sinus rhythm mild tachycardia.  Chart review shows office visit in December 2022 for outpatient evaluation.  Given fever and altered mental status sepsis work-up was started.  Broad-spectrum antibiotics were started however white count is normal chemistry shows mild hyponatremia.  Urinalysis unremarkable chest x-ray unremarkable.  Blood cultures are sent and pending.  Viral panel is negative.  Patient given IV fluid resuscitation half NS given her hypernatremia.  Given Tylenol for her fever rectally.  Zosyn started empirically.  Source is unclear.  CT scan  of the brain shows no acute findings per radiology.  Hospitalist consulted for admission.        Final Clinical Impression(s) / ED Diagnoses Final diagnoses:  Sepsis, due to unspecified organism, unspecified whether acute organ dysfunction present ([place])  Acute encephalopathy    Rx / DC  Orders ED Discharge Orders     None         Luna Fuse, MD 01/26/22 [id number]

## 2022-01-26 NOTE — ED Notes (Signed)
Pt to CT

## 2022-01-26 NOTE — ED Notes (Signed)
Report given to Josph Macho, Therapist, sports. (3E).

## 2022-01-27 ENCOUNTER — Inpatient Hospital Stay (HOSPITAL_COMMUNITY): Payer: Medicare Other

## 2022-01-27 DIAGNOSIS — E876 Hypokalemia: Secondary | ICD-10-CM

## 2022-01-27 DIAGNOSIS — Z7189 Other specified counseling: Secondary | ICD-10-CM

## 2022-01-27 DIAGNOSIS — I5030 Unspecified diastolic (congestive) heart failure: Secondary | ICD-10-CM

## 2022-01-27 DIAGNOSIS — R1310 Dysphagia, unspecified: Secondary | ICD-10-CM

## 2022-01-27 DIAGNOSIS — G9341 Metabolic encephalopathy: Secondary | ICD-10-CM

## 2022-01-27 DIAGNOSIS — E870 Hyperosmolality and hypernatremia: Secondary | ICD-10-CM

## 2022-01-27 DIAGNOSIS — E860 Dehydration: Secondary | ICD-10-CM

## 2022-01-27 DIAGNOSIS — A419 Sepsis, unspecified organism: Secondary | ICD-10-CM

## 2022-01-27 LAB — CBC
HCT: 42.8 % (ref 36.0–46.0)
Hemoglobin: 13.5 g/dL (ref 12.0–15.0)
MCH: 30.1 pg (ref 26.0–34.0)
MCHC: 31.5 g/dL (ref 30.0–36.0)
MCV: 95.5 fL (ref 80.0–100.0)
Platelets: 211 10*3/uL (ref 150–400)
RBC: 4.48 MIL/uL (ref 3.87–5.11)
RDW: 13.6 % (ref 11.5–15.5)
WBC: 6.3 10*3/uL (ref 4.0–10.5)
nRBC: 0 % (ref 0.0–0.2)

## 2022-01-27 LAB — ECHOCARDIOGRAM COMPLETE
AR max vel: 2.76 cm2
AV Peak grad: 6.3 mmHg
Ao pk vel: 1.25 m/s
Area-P 1/2: 3.26 cm2
Calc EF: 58.8 %
Height: 62 in
S' Lateral: 2 cm
Single Plane A2C EF: 60.1 %
Single Plane A4C EF: 58 %
Weight: 1873.03 oz

## 2022-01-27 LAB — BASIC METABOLIC PANEL
Anion gap: 7 (ref 5–15)
BUN: 26 mg/dL — ABNORMAL HIGH (ref 8–23)
CO2: 31 mmol/L (ref 22–32)
Calcium: 8.9 mg/dL (ref 8.9–10.3)
Chloride: 110 mmol/L (ref 98–111)
Creatinine, Ser: 0.84 mg/dL (ref 0.44–1.00)
GFR, Estimated: >60 mL/min (ref >60)
Glucose, Bld: 151 mg/dL — ABNORMAL HIGH (ref 70–99)
Potassium: 4.3 mmol/L (ref 3.5–5.1)
Sodium: 148 mmol/L — ABNORMAL HIGH (ref 135–145)

## 2022-01-27 MED ORDER — ACETAMINOPHEN 650 MG RE SUPP
650.0000 mg | Freq: Four times a day (QID) | RECTAL | Status: DC | PRN
  Administered 2022-01-27: 650 mg via RECTAL
  Filled 2022-01-27 (×2): qty 1

## 2022-01-27 MED ORDER — DEXTROSE 5 % IV SOLN: INTRAVENOUS | Status: DC

## 2022-01-27 MED ORDER — POTASSIUM CHLORIDE 10 MEQ/100ML IV SOLN
10.0000 meq | INTRAVENOUS | Status: AC
  Administered 2022-01-27 (×4): 10 meq via INTRAVENOUS
  Filled 2022-01-27 (×4): qty 100

## 2022-01-27 MED ORDER — IPRATROPIUM-ALBUTEROL 0.5-2.5 (3) MG/3ML IN SOLN
3.0000 mL | Freq: Two times a day (BID) | RESPIRATORY_TRACT | Status: DC
  Administered 2022-01-27 – 2022-01-30 (×6): 3 mL via RESPIRATORY_TRACT
  Filled 2022-01-27 (×6): qty 3

## 2022-01-27 MED ORDER — ALBUTEROL SULFATE (2.5 MG/3ML) 0.083% IN NEBU: 2.5000 mg | INHALATION_SOLUTION | RESPIRATORY_TRACT | Status: DC | PRN

## 2022-01-27 NOTE — Evaluation (Addendum)
Clinical/Bedside Swallow Evaluation Patient Details  Name: Virginia [name] MRN: [id number] Date of Birth: [birth year]  Today's Date: 01/27/2022 Time: SLP Start Time (ACUTE ONLY): 1031 SLP Stop Time (ACUTE ONLY): 1049 SLP Time Calculation (min) (ACUTE ONLY): 18 min  Past Medical History:  Past Medical History:  Diagnosis Date   Anxiety 07/08/2017   Arthritis    Carotid artery disease ([place])    a. s/p L CEA. b. followed by [facility].   Chronic diastolic CHF (congestive heart failure) ([place])    CKD (chronic kidney disease), stage III ([employer])    COPD (chronic obstructive pulmonary disease) ([place])    COPD GOLD IV 12/30/2014   PFTs 12/15/14 FEV1  0.57 (29%) ratio 54 p 17% resp to saba     Cough 01/31/2016   Dementia with behavioral disturbance 07/08/2017   Diverticulosis    Dyspnea    Essential hypertension 12/02/2014   Fall at home Sept. 2013   Family history of adverse reaction to anesthesia    SON IS SLOW TO WAKE    Gastroesophageal reflux disease 07/08/2017   Hyperlipidemia    Hypertension    Hyponatremia 12/02/2014   Hypotension 02/01/2017   Incontinent of urine    Internal hemorrhoid    Intertrochanteric fracture ([place]) 04/20/2019   Memory disorder 10/24/2014   Nausea and vomiting 11/30/2014   Palliative care status    Primary osteoarthritis involving multiple joints 07/08/2017   Vitamin D deficiency 02/15/2015   Past Surgical History:  Past Surgical History:  Procedure Laterality Date   APPENDECTOMY     BREAST REDUCTION SURGERY     CAROTID ENDARTERECTOMY     left CEA   CATARACT EXTRACTION Bilateral    COLONOSCOPY  2015   EYE SURGERY     cataracts removed, bilaterally    INTRAMEDULLARY (IM) NAIL INTERTROCHANTERIC Right 04/21/2019   Procedure: INTRAMEDULLARY (IM) NAIL INTERTROCHANTRIC;  Surgeon: Rod Can, MD;  Location: [facility];  Service: Orthopedics;  Laterality: Right;   skin cancer removal     TONSILLECTOMY     HPI:  Pt is an 86 yo female presenting with cough, fever, and AMS. Pt with  recent COVID infection in late January. Admitted with sepsis due to PNA. Pt had a prior swallow eval in 2016 with chronic pill dysphagia noted as well as a cough after dry solids. Regular solids and thin liquids were recommended at that time. UGI in 2018 revealed delayed passage of the barium tablet, which was held up in the distal esophagus without discrete stricture identified, as well as small HH. PMH includes: GERD, COPD Gold stage IV, chronic hypoxic respite failure on 2 L 24/7, chronic dysphagia on soft diet, advanced dementia, functional paraplegia and bedbound secondary to hip fractures, CKAD stage III, HTN    Assessment / Plan / Recommendation  Clinical Impression  Pt awakens easily and parts her lips in response to tactile stimulation, but she does not make any attempt to take POs despite using a variety of consistencies, gustatory stimulation, and bolus delivery methods. She does not verbalize or follow commands throughout the eval. Pt's siblings were educated at bedside about inability to recommend PO diet in light of current mentation. The concept of comfort feeds was introduced in light of chronic dysphagia with likelihood for acute on chronic exacerbations and ongoing risk for malnutrition and dehydration. MD present and planning to consult palliative care; informed family that comfort feeds, if desired, would have to be in line with overall GOC for pt. Attempted to call son  to discuss as well but not able to reach him and per family at bedside, the pt's daughter does not yet know that the pt is in the hospital. Will continue to follow acutely. SLP Visit Diagnosis: Dysphagia, unspecified (R13.10)    Aspiration Risk  Moderate aspiration risk;Risk for inadequate nutrition/hydration    Diet Recommendation NPO   Medication Administration: Via alternative means    Other  Recommendations Oral Care Recommendations: Oral care QID    Recommendations for follow up therapy are one component of a  multi-disciplinary discharge planning process, led by the attending physician.  Recommendations may be updated based on patient status, additional functional criteria and insurance authorization.  Follow up Recommendations  (tba pending GOC)      Assistance Recommended at Discharge Frequent or constant Supervision/Assistance  Functional Status Assessment Patient has had a recent decline in their functional status and/or demonstrates limited ability to make significant improvements in function in a reasonable and predictable amount of time  Frequency and Duration min 2x/week          Prognosis Prognosis for Safe Diet Advancement: Fair Barriers to Reach Goals: Cognitive deficits;Time post onset      Swallow Study   General HPI: Pt is an 86 yo female presenting with cough, fever, and AMS. Pt with recent COVID infection in late January. Admitted with sepsis due to PNA. Pt had a prior swallow eval in 2016 with chronic pill dysphagia noted as well as a cough after dry solids. Regular solids and thin liquids were recommended at that time. UGI in 2018 revealed delayed passage of the barium tablet, which was held up in the distal esophagus without discrete stricture identified, as well as small HH. PMH includes: GERD, COPD Gold stage IV, chronic hypoxic respite failure on 2 L 24/7, chronic dysphagia on soft diet, advanced dementia, functional paraplegia and bedbound secondary to hip fractures, CKAD stage III, HTN Type of Study: Bedside Swallow Evaluation Previous Swallow Assessment: see HPI Diet Prior to this Study: NPO Temperature Spikes Noted: Yes (101.8) Respiratory Status: Nasal cannula History of Recent Intubation: No Behavior/Cognition: Alert;Doesn't follow directions Oral Cavity Assessment:  (UTA) Oral Care Completed by SLP: No Oral Cavity - Dentition: Adequate natural dentition (as can be visualized) Self-Feeding Abilities: Total assist Patient Positioning: Upright in bed Baseline  Vocal Quality: Not observed Volitional Cough: Cognitively unable to elicit Volitional Swallow: Unable to elicit    Oral/Motor/Sensory Function Overall Oral Motor/Sensory Function: Other (comment) (not following commands to directly assess)   Ice Chips Ice chips: Impaired Presentation: Spoon Oral Phase Impairments: Poor awareness of bolus Pharyngeal Phase Impairments: Unable to trigger swallow   Thin Liquid Thin Liquid: Impaired Presentation: Straw Oral Phase Impairments: Poor awareness of bolus Pharyngeal  Phase Impairments: Unable to trigger swallow    Nectar Thick Nectar Thick Liquid: Not tested   Honey Thick Honey Thick Liquid: Not tested   Puree Puree: Impaired Presentation: Spoon Oral Phase Impairments: Poor awareness of bolus Pharyngeal Phase Impairments: Unable to trigger swallow   Solid     Solid: Not tested      Osie Bond., M.A. [place] Pager [phone] Office [phone]  01/27/2022,10:59 AM

## 2022-01-27 NOTE — Hospital Course (Addendum)
86 year old past medical history significant for COPD Gold stage IV, chronic hypoxic respiratory failure on 2 L of oxygen, chronic dysphagia on soft diet, advanced dementia, functional paraplegia and bedbound secondary to hip fractures, CKD stage III, hypertension presents with fever, increasing cough and mental status changes.  Patient was in hospice care at home since last year, after she had hip fracture which made her bedbound, developed worsening dementia.  Per report her condition has a slowly improved and family took her off of hospice and rescinded DNR she was made full code.  Patient was having increasingly more cough, develops fever and become more lethargic, brought by family for further evaluation.   X-ray: Subsegmental atelectasis in the right lung base, stable left costophrenic angle blunting.  Patient 's son and daughter disagree on goals of care, palliative care helping with discussion.   She passed swallow evaluation. Monitor on diet. Might be able to be discharge soon.

## 2022-01-27 NOTE — Progress Notes (Signed)
PHARMACY - PHYSICIAN COMMUNICATION CRITICAL VALUE ALERT - BLOOD CULTURE IDENTIFICATION (BCID)  Virginia [name] is an 86 y.o. female who presented to [facility] on 01/26/2022 with a chief complaint of AMS  Assessment:  70 YOF with fever and AMS concerning for PNA. Now with 1 of 4 blood cultures growing GPC with BCID detecting MSSE - likely representative of a contaminant  Name of physician (or Provider) Contacted: [place]  Current antibiotics: Zosyn + Doxy  Changes to prescribed antibiotics recommended:  Patient is on recommended antibiotics - No changes needed  Results for orders placed or performed during the hospital encounter of 01/26/22  Blood Culture ID Panel (Reflexed) (Collected: 01/26/2022  1:08 PM)  Result Value Ref Range   Enterococcus faecalis NOT DETECTED NOT DETECTED   Enterococcus Faecium NOT DETECTED NOT DETECTED   Listeria monocytogenes NOT DETECTED NOT DETECTED   Staphylococcus species DETECTED (A) NOT DETECTED   Staphylococcus aureus (BCID) NOT DETECTED NOT DETECTED   Staphylococcus epidermidis DETECTED (A) NOT DETECTED   Staphylococcus lugdunensis NOT DETECTED NOT DETECTED   Streptococcus species NOT DETECTED NOT DETECTED   Streptococcus agalactiae NOT DETECTED NOT DETECTED   Streptococcus pneumoniae NOT DETECTED NOT DETECTED   Streptococcus pyogenes NOT DETECTED NOT DETECTED   A.calcoaceticus-baumannii NOT DETECTED NOT DETECTED   Bacteroides fragilis NOT DETECTED NOT DETECTED   Enterobacterales NOT DETECTED NOT DETECTED   Enterobacter cloacae complex NOT DETECTED NOT DETECTED   Escherichia coli NOT DETECTED NOT DETECTED   Klebsiella aerogenes NOT DETECTED NOT DETECTED   Klebsiella oxytoca NOT DETECTED NOT DETECTED   Klebsiella pneumoniae NOT DETECTED NOT DETECTED   Proteus species NOT DETECTED NOT DETECTED   Salmonella species NOT DETECTED NOT DETECTED   Serratia marcescens NOT DETECTED NOT DETECTED   Haemophilus influenzae NOT DETECTED NOT DETECTED    Neisseria meningitidis NOT DETECTED NOT DETECTED   Pseudomonas aeruginosa NOT DETECTED NOT DETECTED   Stenotrophomonas maltophilia NOT DETECTED NOT DETECTED   Candida albicans NOT DETECTED NOT DETECTED   Candida auris NOT DETECTED NOT DETECTED   Candida glabrata NOT DETECTED NOT DETECTED   Candida krusei NOT DETECTED NOT DETECTED   Candida parapsilosis NOT DETECTED NOT DETECTED   Candida tropicalis NOT DETECTED NOT DETECTED   Cryptococcus neoformans/gattii NOT DETECTED NOT DETECTED   Methicillin resistance mecA/C NOT DETECTED NOT DETECTED    Lawson Radar 01/27/2022  2:26 PM

## 2022-01-27 NOTE — Assessment & Plan Note (Addendum)
Present with fever, tachypnea, tachycardia, source of infection probably aspiration pneumonia.  Continue with IV antibiotics.

## 2022-01-27 NOTE — Progress Notes (Signed)
Patient had a temp of 100.61F. Tylenol suppository given. Will reassess.

## 2022-01-27 NOTE — Assessment & Plan Note (Addendum)
Acute on chronic  Speech continue to follow-up.  Continue with IV fluids. Speech evaluated patient today, started on Dysphagia 1 diet.

## 2022-01-27 NOTE — Progress Notes (Signed)
Pharmacy Antibiotic Note  Virginia [name] is a 86 y.o. female admitted on 01/26/2022 with pneumonia/sepsis.  Pharmacy has been consulted for Zosyn dosing.  ID: COVID late January (no treatment) now with cough and AMS - Tmax 101.8, Tc 100.8, WBC 7.5 WNL, Scr 0.8 (CrCl 42)  Zosyn 2/26>> Doxy 2/26>>  2/26: Covid/Flu: negative  Plan: Zosyn 3.375g IV q8h (4 hour infusion). Pharmacy will sign off. Please reconsult for further dosing assitance. Dose ok for CrCl down to 20    Height: 5\' 2"  (157.5 cm) Weight: 53.1 kg (117 lb 1 oz) IBW/kg (Calculated) : 50.1  Temp (24hrs), Avg:100.6 F (38.1 C), Min:99 F (37.2 C), Max:101.8 F (38.8 C)  Recent Labs  Lab 01/26/22 1308 01/26/22 1317 01/26/22 2142  WBC  --   --  7.5  CREATININE 0.73 0.80  --   LATICACIDVEN 1.0  --   --     Estimated Creatinine Clearance: 42.1 mL/min (by C-G formula based on SCr of 0.8 mg/dL).    Allergies  Allergen Reactions   Lidocaine Anaphylaxis, Swelling, Rash and Other (See Comments)    Any of the " Caine  Family "   Other Other (See Comments)    All drugs that end in "cane'-  novacane etc. Daughter states patient almost died when she had it when she was born     Princess Virginia [name] Highland, PharmD, BCPS Clinical Staff Pharmacist [email or web address] [place] 01/27/2022 7:59 AM

## 2022-01-27 NOTE — Assessment & Plan Note (Addendum)
Palliative  care has been consulted. Patient was under hospice cares since last year, family took her off of hospice because she was slowly improving. Patient 's son and daughter disagree in goals of care. Palliative care assisting. Daughter will be here tonight, Josseline with palliative will see patient 3/02.

## 2022-01-27 NOTE — Assessment & Plan Note (Addendum)
Replaced IV

## 2022-01-27 NOTE — Assessment & Plan Note (Addendum)
In the setting of fever, infection.  Also underlying dementia. She is non verbal at baseline.  Continue with supportive care More  alert, non verbal.

## 2022-01-27 NOTE — Assessment & Plan Note (Addendum)
Likely secondary to poor oral intake. Continue with IV fluids. Improved.

## 2022-01-27 NOTE — Progress Notes (Signed)
°  Progress Note   Patient: Virginia [name] XID:[id number] DOB: August 02, 1936 DOA: 01/26/2022     1 DOS: the patient was seen and examined on 01/27/2022   Brief hospital course: 86 year old past medical history significant for COPD Gold stage IV, chronic hypoxic respiratory failure on 2 L of oxygen, chronic dysphagia on soft diet, advanced dementia, functional paraplegia and bedbound secondary to hip fractures, CKD stage III, hypertension presents with fever, increasing cough and mental status changes.  Patient was in hospice care at home since last year, after she had hip fracture which made her bedbound, developed worsening dementia.  Per report her condition has a slowly improved and family took her off of hospice and rescinded DNR she was made full code.  On today, patient has been having increasingly more cough, develops fever and become more lethargic.  X-ray: Subsegmental atelectasis in the right lung base, stable left costophrenic angle blunting.  Assessment and Plan: Dysphagia Acute on chronic, she did not pass a swallow evaluation today.  Remain NPO.  Speech continue to follow-up.  Continue with IV fluids.  Sepsis ([place]) Present with fever, tachypnea, tachycardia, source of infection probably aspiration pneumonia.  Continue with IV antibiotics   Acute metabolic encephalopathy In the setting of fever, infection.  Also underlying dementia. Continue with supportive care  Hypokalemia Replaced.  Hypernatremia Improving with IV fluids, will change half-normal saline to D5.  Repeat levels in the morning.  Dehydration Likely secondary to poor oral intake. Continue with IV fluids  Goals of care, counseling/discussion Palliative  care has been consulted. Patient was under hospice cares since last year, family took her off of hospice because she was slowly improving.   Dementia without behavioral disturbance ([place]) Continue with Zyprexa as needed.  Chronic kidney disease, stage 3b  (HCC) Stable.  Monitor  Community acquired pneumonia Patient presented with fever, cough, initial chest x-ray showed right lung base atelectasis. He is at risk for aspiration pneumonia, will continue with IV Zosyn and doxycycline. 1 out of 4 blood cultures positive GPC with PCIT detecting MSSE.  Already on Doxy. Failed swallow evaluation, continue n.p.o. Palliative care consultation for goals of care        Subjective: no responsive, she has been non verbal for some time per family.  Eyes open.   Physical Exam: Vitals:   01/27/22 0744 01/27/22 0900 01/27/22 1147 01/27/22 1534  BP: (!) 177/95 132/74 (!) 175/83 (!) 163/77  Pulse: (!) 112  92 97  Resp: 20  19 18   Temp: (!) 100.8 F (38.2 C) 99.4 F (37.4 C) 98.5 F (36.9 C) 98.6 F (37 C)  TempSrc: Oral Axillary Oral   SpO2: 97%  92% 94%  Weight:      Height:       General: NAD, lethargic  CVS; S 1, S 2 RRR Lung; BL ronchus.   Data Reviewed:  Cbc and Bmet reviewed.   Family Communication: brother at bedside.   Disposition: Status is: Inpatient Remains inpatient appropriate because: needs IV fluids          Planned Discharge Destination: Home     Time spent: 45 minutes  Author: Elmarie Shiley, MD 01/27/2022 5:11 PM  For on call review [email or web address].

## 2022-01-27 NOTE — Assessment & Plan Note (Signed)
Continue with Zyprexa as needed.

## 2022-01-27 NOTE — Consult Note (Addendum)
Consultation Note Date: 01/27/2022   Patient Name: Virginia [name]  DOB: 12/25/1935  MRN: [id number]  Age / Sex: 86 y.o., female  PCP: Virginia Chandler, NP Referring Physician: Elmarie Shiley, MD  Reason for Consultation: Establishing goals of care  HPI/Patient Profile: 86 y.o. female  with past medical history of Gold stage IV, chronic hypoxic respite failure on 2 L 24/7, chronic dysphagia on soft diet, advanced dementia, functional paraplegia and bedbound secondary to hip fractures, CKD stage III, HTN admitted on 01/26/2022 with fever, increasing cough and mentation changes.   Patient was in hospice care at home since last year after her hip fracture which made her bed bound and worsening of dementia, but her condition has slowly improving and family took her off hospice and on palliative care, also rescinded DNR to Full Code last Wednesday. PMT has been consulted to assist with goals of care conversation.  Clinical Assessment and Goals of Care:  I have reviewed medical records including EPIC notes, labs and imaging, received report from RN, assessed the patient and then met at the bedside along with her brother Virginia [name] and sister Virginia [name] to discuss diagnosis prognosis, GOC, EOL wishes, disposition and options. I then called patient's son Virginia [name] to discuss the above and offer to arrange a family meeting.  I introduced Palliative Medicine as specialized medical care for people living with serious illness. It focuses on providing relief from the symptoms and stress of a serious illness. The goal is to improve quality of life for both the patient and the family.   Medical History Review and Understanding: We discussed patient's sepsis, COPD exacerbation, pneumonia and advanced dementia in detail. Discussed course of illness, emphasizing that patient is hospice eligible despite previous conversation that she was  improved last week. Virginia [name] was previously on antipsychotics for behavioral disturbances at daughter's request and her son shares with me the efforts he has put in discontinuing these.   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 She has a brother and a sister, as well as a son and a daughter. Currently lives with her brother and son. She was enjoying TV, her stuffed animals, and visits from her friends until her rapid decline this past weekend. Sable has been reliant on her family for care for about six years.  Functional and Nutritional State: Bedbound since her hip fracture last year. Appetite was improving until the past few days as she would not wake to eat/drink.   Palliative Symptoms: Mental status change   Advance Directives: A detailed discussion regarding advanced directives was had.  Code Status: Concepts specific to code status, artifical feeding and hydration, and rehospitalization were considered and discussed.   Discussion: Patient's siblings are unsure if goals are more aligned with palliative care or hospice. Her brother Virginia [name] tells me that he is still trying to contact patient's daughter Virginia [name]. During our phone conversation, patient's son Virginia [name] tells me he is quite sick himself and will  not be able to come in for a family meeting soon. He shares that his mother "bounced back" and was improving enough to rescind hospice. [name] feels strongly that he needs to try everything possible to help his mother recover back to her previous baseline, which is using facial expressions to interact and watching her favorite shows. He laments not being able to arrange appropriate care for [name] during her COVID infection in January, as his sister did not wish for this at the time, and he vows to do things differently this time. They often disagree on the best plan for their mother. I shared my concern that CPR/ventilation  would likely cause more suffering than benefit, however Virginia [name] would like to continue with a FULL code status at this time. He is willing to continue the conversation based on whether Virginia [name] is able to enjoy the QOL stated above.    The difference between aggressive medical intervention and comfort care was considered in light of the patient's goals of care. Hospice and Palliative Care services outpatient were explained and offered.   Discussed the importance of continued conversation with family and the medical providers regarding overall plan of care and treatment options, ensuring decisions are within the context of the patients values and GOCs.   Questions and concerns were addressed. The family was encouraged to call with questions or concerns.  PMT will continue to support holistically.   NEXT OF KIN are patient's son and daughter. No HCPOA on file. Patient is unable to make decisions on her own behalf.    SUMMARY OF RECOMMENDATIONS   -Continue full code/full scope treatment -Ongoing discussions pending clinical course; patient's son and daughter disagree on goals of care -Psychosocial and emotional support provided -PMT will continue to follow  Prognosis:  < 6 months ; worrisome with high mortality risk given rapid decline, chronic comorbidities, advanced age, and poor functional/cognitive/nutritional status   Discharge Planning: To Be Determined      Primary Diagnoses: Present on Admission:  Pneumonia   I have reviewed the medical record, interviewed the patient and family, and examined the patient. The following aspects are pertinent.  Past Medical History:  Diagnosis Date   Anxiety 07/08/2017   Arthritis    Carotid artery disease ([place])    a. s/p L CEA. b. followed by [facility].   Chronic diastolic CHF (congestive heart failure) ([place])    CKD (chronic kidney disease), stage III ([place])    COPD (chronic obstructive pulmonary disease) ([place])    COPD GOLD IV 12/30/2014   PFTs 12/15/14  FEV1  0.57 (29%) ratio 54 p 17% resp to saba     Cough 01/31/2016   Dementia with behavioral disturbance 07/08/2017   Diverticulosis    Dyspnea    Essential hypertension 12/02/2014   Fall at home Sept. 2013   Family history of adverse reaction to anesthesia    SON IS SLOW TO WAKE    Gastroesophageal reflux disease 07/08/2017   Hyperlipidemia    Hypertension    Hyponatremia 12/02/2014   Hypotension 02/01/2017   Incontinent of urine    Internal hemorrhoid    Intertrochanteric fracture ([place]) 04/20/2019   Memory disorder 10/24/2014   Nausea and vomiting 11/30/2014   Palliative care status    Primary osteoarthritis involving multiple joints 07/08/2017   Vitamin D deficiency 02/15/2015   Social History   Socioeconomic History   Marital status: Divorced    Spouse name: Not on file   Number of children: 2   Years  of education: 7   Highest education level: Not on file  Occupational History   Occupation: retired  Tobacco Use   Smoking status: Former    Packs/day: 0.25    Years: 20.00    Pack years: 5.00    Types: Cigarettes    Quit date: 04/2019    Years since quitting: 2.8   Smokeless tobacco: Never   Tobacco comments:    4 or 5 cigarettes per day average  Vaping Use   Vaping Use: Never used  Substance and Sexual Activity   Alcohol use: Not Currently    Alcohol/week: 0.0 standard drinks   Drug use: No   Sexual activity: Not Currently    Birth control/protection: Post-menopausal  Other Topics Concern   Not on file  Social History Narrative   04/05/18 lives with son and brother, Virginia [name]   Diet:       Do you drink/eat things with caffeine: yes      Marital Status: Divorced   What Year Married:1959      Do you live in a house, apartment, Assisted Living, [place], trailer? House      Is it one or more stories? 2 stories      How many persons live in your home? Just Patient      Do you have any pets in your home? 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 Very Little   Type and how often? Walk      Do you have a living will? None   DNR?   Discuss one? No      Do you have signed POA/HPOA forms?      Patient drinks 1 cup of caffeine daily.   Patient is right handed.   Social Determinants of Health   Financial Resource Strain: Not on file  Food Insecurity: Not on file  Transportation Needs: Not on file  Physical Activity: Not on file  Stress: Not on file  Social Connections: Not on file   Family History  Problem Relation Age of Onset   Heart disease Father        Before age 74   Hypertension Father    Heart attack Father    Cancer Mother 81       Brain   Dementia Brother    Cancer Maternal Aunt 61       breast cancer   Diabetes Maternal Aunt    High blood pressure Brother    Hypothyroidism Daughter    High blood pressure Son    High Cholesterol Son    Diabetes Son    COPD Son    Obesity Son    Heart failure Maternal Grandmother    Diabetes Maternal Grandmother    Stroke Neg Hx    Scheduled Meds:  aspirin  300 mg Rectal Daily   budesonide  0.5 mg Nebulization BID   enoxaparin (LOVENOX) injection  40 mg Subcutaneous Q24H   fluticasone  1 spray Each Nare Daily   ipratropium-albuterol  3 mL Nebulization BID   pantoprazole (PROTONIX) IV  40 mg Intravenous Q24H   Continuous Infusions:  dextrose 75 mL/hr at 01/27/22 1145   doxycycline (VIBRAMYCIN) IV 100 mg (01/27/22 0925)   piperacillin-tazobactam (ZOSYN)  IV 3.375 g (01/27/22 1151)   valproate sodium 125 mg (01/27/22 0945)   PRN Meds:.acetaminophen, acetaminophen, albuterol, hydrALAZINE, OLANZapine Medications Prior to Admission:  Prior to Admission medications   Medication Sig  Start Date End Date Taking? Authorizing Provider  acetaminophen (TYLENOL) 160 MG/5ML liquid Take 320 mg by mouth in the morning and at bedtime.   Yes [provider]  budesonide (PULMICORT) 0.25 MG/2ML nebulizer solution Take 0.25 mg by nebulization 2 (two) times daily.  01/10/22  Yes [provider]  Carbinoxamine Maleate 4 MG TABS TAKE 1 TABLET IN MORNING AND 1 AT BEDTIME Patient taking differently: Take 4 mg by mouth in the morning and at bedtime. 12/31/21  Yes Valentina Shaggy, MD  DM-APAP-CPM (CORICIDIN HBP PO) Take 30 mLs by mouth in the morning and at bedtime. Liquid   Yes [provider]  guaiFENesin (ROBITUSSIN) 100 MG/5ML liquid Take 400 mg by mouth in the morning and at bedtime.   Yes [provider]  ipratropium-albuterol (DUONEB) 0.5-2.5 (3) MG/3ML SOLN USE 1 VIAL IN NEBULIZER EVERY 6 HOURS - and as needed Patient taking differently: Take 3 mLs by nebulization See admin instructions. Every 6 hours scheduled and as needed for wheezing and/or shortness of breath 07/24/20  Yes Virginia Chandler, NP  propranolol (INDERAL) 20 MG tablet TAKE 1 TABLET(20 MG) BY MOUTH TWICE DAILY Patient taking differently: Take 20 mg by mouth 2 (two) times daily. 03/21/21  Yes Bhagat, Bhavinkumar, PA  acetaminophen (TYLENOL) 500 MG tablet Take 1,000 mg by mouth in the morning and at bedtime.  Patient not taking: Reported on 01/27/2022    [provider]  budesonide (PULMICORT) 0.5 MG/2ML nebulizer solution Take 2 mLs (0.5 mg total) by nebulization 2 (two) times daily. Patient not taking: Reported on 01/27/2022 09/14/20   Valentina Shaggy, MD  divalproex (DEPAKOTE SPRINKLE) 125 MG capsule Take 1 capsule (125 mg total) by mouth 2 (two) times daily. Patient not taking: Reported on 01/27/2022 01/23/21   Suzzanne Cloud, NP  donepezil (ARICEPT) 10 MG tablet Take 1 tablet (10 mg total) by mouth daily. Call to schedule follow up for ongoing refills [phone] Patient not taking: Reported on 01/27/2022 05/20/21   Suzzanne Cloud, NP  fluticasone Asencion Islam) 50 MCG/ACT nasal spray Use 1 spray each nostril once a day as needed for stuffy nose Patient not taking: Reported on 01/27/2022 03/25/21   Althea Charon, FNP  furosemide (LASIX) 20 MG tablet  Take 1 tablet (20 mg total) by mouth daily. Please make overdue appt with Dr. Harrington Challenger before anymore refills. Thank you 1st attempt Patient not taking: Reported on 01/27/2022 12/05/21   Fay Records, MD  memantine (NAMENDA) 10 MG tablet Take 1 tablet (10 mg total) by mouth 2 (two) times daily. Patient not taking: Reported on 01/27/2022 06/10/21   Suzzanne Cloud, NP  pantoprazole (PROTONIX) 40 MG tablet TAKE 1 TABLET(40 MG) BY MOUTH TWICE DAILY Patient not taking: Reported on 01/27/2022 01/21/21   Virginia Chandler, NP  QUEtiapine (SEROQUEL) 25 MG tablet Take 1 tablet (25 mg total) by mouth at bedtime. Please call [phone] to schedule follow up appt. Patient not taking: Reported on 01/27/2022 07/22/21   Kathrynn Ducking, MD  rosuvastatin (CRESTOR) 20 MG tablet Take 1 tablet (20 mg total) by mouth daily. Please make overdue appt with Dr. Harrington Challenger before anymore refills. Thank you 1st attempt Patient not taking: Reported on 01/27/2022 12/05/21   Fay Records, MD  sertraline (ZOLOFT) 50 MG tablet Take 2 tablets (100 mg total) by mouth daily. Patient not taking: Reported on 01/27/2022 06/12/21   Suzzanne Cloud, NP   Allergies  Allergen Reactions   Lidocaine Anaphylaxis, Swelling, Rash  and Other (See Comments)    Any of the " [place]  Family "   Other Other (See Comments)    All drugs that end in "cane'-  novacane etc. Daughter states patient almost died when she had it when she was born   Review of Systems  Unable to perform ROS: Dementia   Physical Exam Vitals and nursing note reviewed.  Constitutional:      General: She is not in acute distress.    Appearance: She is ill-appearing.     Interventions: Nasal cannula in place.     Comments: 2L  Cardiovascular:     Rate and Rhythm: Normal rate and regular rhythm.  Pulmonary:     Effort: Pulmonary effort is normal.  Abdominal:     General: There is no distension.     Palpations: Abdomen is soft.  Skin:    General: Skin is warm and dry.  Neurological:      Mental Status: Mental status is at baseline. She is lethargic.    Vital Signs: BP (!) 175/83    Pulse 92    Temp 98.5 F (36.9 C) (Oral)    Resp 19    Ht _0  (1.575 m)    Wt 53.1 kg    LMP  (LMP Unknown)    SpO2 92%    BMI 21.41 kg/m  Pain Scale: 0-10   Pain Score: 0-No pain   SpO2: SpO2: 92 % O2 Device:SpO2: 92 % O2 Flow Rate: .O2 Flow Rate (L/min): 2 L/min  IO: Intake/output summary:  Intake/Output Summary (Last 24 hours) at 01/27/2022 1315 Last data filed at 01/27/2022 0323 Gross per 24 hour  Intake 100.88 ml  Output 100 ml  Net 0.88 ml    LBM:   Baseline Weight: Weight: 63.5 kg Most recent weight: Weight: 53.1 kg     Palliative Assessment/Data: 10%    Total time: I spent 75 minutes in the care of the patient today in the above activities and documenting the encounter.  MDM: High  Medical Decision Making: 4 #/Complex Problems: 4                 Data Reviewed:  4            Management: Park, PA-C Palliative Medicine Team Team phone # [phone]  Thank you for allowing the Palliative Medicine Team to assist in the care of this patient. Please utilize secure chat with additional questions, if there is no response within 30 minutes please call the above phone number.  Palliative Medicine Team providers are available by phone from 7am to 7pm daily and can be reached through the team cell phone.  Should this patient require assistance outside of these hours, please call the patient's attending physician.

## 2022-01-27 NOTE — Assessment & Plan Note (Signed)
Stable.  Monitor.  

## 2022-01-27 NOTE — Assessment & Plan Note (Addendum)
Patient presented with fever, cough, initial chest x-ray showed right lung base atelectasis. She is at risk for aspiration pneumonia, will continue with IV Zosyn and doxycycline. 1 out of 4 blood cultures positive GPC with PCIT detecting MSSE.  Already on Doxy. Likely contaminate.  Passed swallow evaluation today  Palliative care consultation for goals of care. Appreciate Palliative care assistance.

## 2022-01-27 NOTE — TOC Progression Note (Signed)
Transition of Care [facility]) - Progression Note    Patient Details  Name: Virginia [name] MRN: [id number] Date of Birth: 1936-07-30  [facility] CM/SW Contact  Zenon Mayo, RN Phone Number: 01/27/2022, 11:21 AM  Clinical Narrative:    NCM contacted Melissa with [facility], because patient's insurance is showing [facility].  Lenna Sciara states she was discharged from their [facility] on 2/24 because she is no longer terminally ill.          Expected Discharge Plan and Services                                                 Social Determinants of Health (SDOH) Interventions    Readmission Risk Interventions No flowsheet data found.

## 2022-01-27 NOTE — Assessment & Plan Note (Addendum)
Improving with IV fluids, D 5.   -Resolved.

## 2022-01-28 LAB — CBC
HCT: 39.7 % (ref 36.0–46.0)
Hemoglobin: 12.7 g/dL (ref 12.0–15.0)
MCH: 30.7 pg (ref 26.0–34.0)
MCHC: 32 g/dL (ref 30.0–36.0)
MCV: 95.9 fL (ref 80.0–100.0)
Platelets: 180 10*3/uL (ref 150–400)
RBC: 4.14 MIL/uL (ref 3.87–5.11)
RDW: 13.5 % (ref 11.5–15.5)
WBC: 10.2 10*3/uL (ref 4.0–10.5)
nRBC: 0 % (ref 0.0–0.2)

## 2022-01-28 LAB — BASIC METABOLIC PANEL
Anion gap: 8 (ref 5–15)
BUN: 31 mg/dL — ABNORMAL HIGH (ref 8–23)
CO2: 29 mmol/L (ref 22–32)
Calcium: 8.6 mg/dL — ABNORMAL LOW (ref 8.9–10.3)
Chloride: 108 mmol/L (ref 98–111)
Creatinine, Ser: 0.84 mg/dL (ref 0.44–1.00)
GFR, Estimated: >60 mL/min (ref >60)
Glucose, Bld: 139 mg/dL — ABNORMAL HIGH (ref 70–99)
Potassium: 3.5 mmol/L (ref 3.5–5.1)
Sodium: 145 mmol/L (ref 135–145)

## 2022-01-28 LAB — MYCOPLASMA PNEUMONIAE ANTIBODY, IGM: Mycoplasma pneumo IgM: <770 U/mL (ref 0–769)

## 2022-01-28 LAB — GLUCOSE, CAPILLARY
Glucose-Capillary: 125 mg/dL — ABNORMAL HIGH (ref 70–99)
Glucose-Capillary: 127 mg/dL — ABNORMAL HIGH (ref 70–99)

## 2022-01-28 NOTE — Progress Notes (Signed)
Daily Progress Note   Patient Name: Virginia [name]       Date: 01/28/2022 DOB: [birth year]  Age: 86 y.o. MRN#: [id number] Attending Physician: Elmarie Shiley, MD Primary Care Physician: Lauree Chandler, NP Admit Date: 01/26/2022  Reason for Consultation/Follow-up: Establishing goals of care, Interfamily conflict, and Psychosocial/spiritual support  Subjective: Medical records reviewed. Patient assessed at the bedside. Appears uncomfortable with a furrowed brow. No family present during my visit.   I called patient's daughter Virginia [name] and then called patient's son Virginia [name] to continue goals of care conversation. Neither are able to provide HCPOA documentation at this time, however both inform me that they have equal rights as the two surrogate decision makers. Virginia [name] plans to visit tomorrow from [place] and is working on obtaining this documentation from the family lawyer today. Virginia [name] is sick and states he is unable to visit at the bedside for now. Unfortunately both of patient's adult children are in strong disagreement on goals of care, with Virginia [name] desiring full code/full scope treatment and Virginia [name] desiring DNR/comfort-focused care. They describe a long history of intense disagreements and both share their worries about the impacts on their mother's health.   We reviewed patient's concerning condition and that she is still NPO due to continued safety concerns given possible aspiration pneumonia. I shared my worry that patient appears to be approaching end of life, despite best efforts and life-sustaining interventions. Reiterated the importance of supporting their mother's wishes if she were able to speak for herself and share her preferences. Reviewed the risks and benefits of artificial feeding and  the option of comfort feeds.   Questions and concerns addressed. PMT will continue to support holistically.   Length of Stay: 2  Current Medications: Scheduled Meds:   aspirin  300 mg Rectal Daily   budesonide  0.5 mg Nebulization BID   enoxaparin (LOVENOX) injection  40 mg Subcutaneous Q24H   fluticasone  1 spray Each Nare Daily   ipratropium-albuterol  3 mL Nebulization BID   pantoprazole (PROTONIX) IV  40 mg Intravenous Q24H    Continuous Infusions:  dextrose 75 mL/hr at 01/27/22 1145   doxycycline (VIBRAMYCIN) IV 100 mg (01/28/22 1333)   piperacillin-tazobactam (ZOSYN)  IV 3.375 g (01/28/22 0914)   valproate sodium 125 mg (01/28/22 1145)    PRN Meds: acetaminophen, acetaminophen,  albuterol, hydrALAZINE, OLANZapine  Physical Exam Vitals and nursing note reviewed.  Constitutional:      Appearance: She is ill-appearing.     Interventions: Nasal cannula in place.     Comments: nonverbal  Cardiovascular:     Rate and Rhythm: Normal rate and regular rhythm.  Pulmonary:     Effort: Pulmonary effort is normal. No respiratory distress.  Skin:    General: Skin is warm and dry.  Neurological:     Mental Status: Mental status is at baseline. She is lethargic.            Vital Signs: BP (!) 149/81    Pulse 72    Temp 99.1 F (37.3 C) (Oral)    Resp 15    Ht 5\' 2"  (1.575 m)    Wt 55.2 kg    LMP  (LMP Unknown)    SpO2 95%    BMI 22.26 kg/m  SpO2: SpO2: 95 % O2 Device: O2 Device: Nasal Cannula O2 Flow Rate: O2 Flow Rate (L/min): 2 L/min  Intake/output summary:  Intake/Output Summary (Last 24 hours) at 01/28/2022 1506 Last data filed at 01/27/2022 2000 Gross per 24 hour  Intake 0 ml  Output 250 ml  Net -250 ml   LBM:   Baseline Weight: Weight: 63.5 kg Most recent weight: Weight: 55.2 kg       Palliative Assessment/Data: 10%      Patient Active Problem List   Diagnosis Date Noted   Dehydration 01/27/2022   Hypernatremia 01/27/2022   Hypokalemia 28/41/3244    Acute metabolic encephalopathy 12/03/7251   Sepsis ([place]) 01/27/2022   Dysphagia 01/27/2022   Goals of care, counseling/discussion    Pneumonia 01/26/2022   Acute encephalopathy    Displaced intertrochanteric fracture of right femur ([place]) 04/21/2019   Closed intertrochanteric fracture of hip, right, initial encounter ([place]) 04/20/2019   Leukocytosis 04/20/2019   Fall at home, initial encounter 04/20/2019   Primary osteoarthritis involving multiple joints 07/08/2017   Dementia without behavioral disturbance ([place]) 07/08/2017   Anxiety 07/08/2017   Gastroesophageal reflux disease 07/08/2017   Hypotension 02/01/2017   Chronic kidney disease, stage 3b ([place]) 02/01/2017   Cough 01/31/2016   Vitamin D deficiency 02/15/2015   Hyperlipidemia 01/18/2015   COPD GOLD IV 12/30/2014   Chronic diastolic CHF (congestive heart failure) ([place]) 12/08/2014   Dyspnea    Hyponatremia 12/02/2014   Essential hypertension 12/02/2014   Community acquired pneumonia 11/30/2014   Nausea and vomiting 11/30/2014   Memory disorder 10/24/2014   Carotid artery disease ([place]) 10/19/2012    Palliative Care Assessment & Plan   Patient Profile: 86 y.o. female  with past medical history of Gold stage IV, chronic hypoxic respite failure on 2 L 24/7, chronic dysphagia on soft diet, advanced dementia, functional paraplegia and bedbound secondary to hip fractures, CKD stage III, HTN admitted on 01/26/2022 with fever, increasing cough and mentation changes.    Patient was in hospice care at home since last year after her hip fracture which made her bed bound and worsening of dementia, but her condition has slowly improving and family took her off hospice and on palliative care, also rescinded DNR to Full Code last Wednesday. PMT has been consulted to assist with goals of care conversation.   Assessment: Advanced dementia Goals of care conversation COPD stage 4 Acute metabolic  encephalopathy7 CAP Sepsis  Recommendations/Plan: Continue efforts in mediation to support family and determine goals of care Awaiting HCPOA documentation when family is able  Continue current  care for now Psychosocial and emotional support provided PMT will continue to follow   Prognosis:  Guarded  Discharge Planning: To Be Determined  Care plan was discussed with Dr. Tyrell Antonio, Pollyann Glen SLP, patient's son Virginia [name] and patient's daughter Virginia [name]  Total time: I spent 60 minutes in the care of the patient today in the above activities and documenting the encounter.  MDM: High  Medical Decision Making: 4 #/Complex Problems: 4                   Data Reviewed: 4              Management: [place], PA-C Palliative Medicine Team Team phone # [phone]  Thank you for allowing the Palliative Medicine Team to assist in the care of this patient. Please utilize secure chat with additional questions, if there is no response within 30 minutes please call the above phone number.  Palliative Medicine Team providers are available by phone from 7am to 7pm daily and can be reached through the team cell phone.  Should this patient require assistance outside of these hours, please call the patient's attending physician.

## 2022-01-28 NOTE — Progress Notes (Signed)
Progress Note   Patient: Virginia [name] JKK:[id number] DOB: 06-27-1936 DOA: 01/26/2022     2 DOS: the patient was seen and examined on 01/28/2022   Brief hospital course: 86 year old past medical history significant for COPD Gold stage IV, chronic hypoxic respiratory failure on 2 L of oxygen, chronic dysphagia on soft diet, advanced dementia, functional paraplegia and bedbound secondary to hip fractures, CKD stage III, hypertension presents with fever, increasing cough and mental status changes.  Patient was in hospice care at home since last year, after she had hip fracture which made her bedbound, developed worsening dementia.  Per report her condition has a slowly improved and family took her off of hospice and rescinded DNR she was made full code.  Patient was having increasingly more cough, develops fever and become more lethargic, brought by family for further evaluation.   X-ray: Subsegmental atelectasis in the right lung base, stable left costophrenic angle blunting.  Patient 's son and daughter disagree on goals of care, palliative care helping with discussion.   Assessment and Plan: Dysphagia Acute on chronic  Speech continue to follow-up.  Continue with IV fluids. Speech evaluated patient today, recommend NPO.   Sepsis ([place]) Present with fever, tachypnea, tachycardia, source of infection probably aspiration pneumonia.  Continue with IV antibiotics.   Acute metabolic encephalopathy In the setting of fever, infection.  Also underlying dementia. She is non verbal at baseline.  Continue with supportive care Appears more alert, non verbal.   Hypokalemia Replaced.  Hypernatremia Improving with IV fluids, will change half-normal saline to D5.  Repeat levels in the morning. -Improved.   Dehydration Likely secondary to poor oral intake. Continue with IV fluids. Improved.   Goals of care, counseling/discussion Palliative  care has been consulted. Patient was under hospice  cares since last year, family took her off of hospice because she was slowly improving. Patient 's son and daughter disagree in goals of care. Palliative care assisting.   Dementia without behavioral disturbance ([place]) Continue with Zyprexa as needed.  Chronic kidney disease, stage 3b (HCC) Stable.  Monitor  Community acquired pneumonia Patient presented with fever, cough, initial chest x-ray showed right lung base atelectasis. She is at risk for aspiration pneumonia, will continue with IV Zosyn and doxycycline. 1 out of 4 blood cultures positive GPC with PCIT detecting MSSE.  Already on Doxy. Likely contaminate.  Failed swallow evaluation, continue n.p.o. Palliative care consultation for goals of care. Appreciate Palliative care assistance.         Subjective: Non verbal.  Appear more Alert.   Physical Exam: Vitals:   01/28/22 0740 01/28/22 0800 01/28/22 0919 01/28/22 1024  BP: (!) 162/65 (!) 151/70 (!) 131/58 (!) 149/81  Pulse: 85 71  72  Resp: (!) 21 18  15   Temp: 98.7 F (37.1 C)   99.1 F (37.3 C)  TempSrc: Oral   Oral  SpO2: 98% 100% 96% 95%  Weight:      Height:       General; NAD, eyes open, non verbal.  CVS; S 1, S 2 RRR Lung; CTA  Data Reviewed:  CBC and Bmet reviewed.   Family Communication: Son updated 2/27, Daughter Updated 2/28  Disposition: Status is: Inpatient Remains inpatient appropriate because: treatment with IV fluids, IV antibiotics.           Planned Discharge Destination: Home     Time spent: 45 minutes  Author: Elmarie Shiley, MD 01/28/2022 4:53 PM  For on call review [email or web address].

## 2022-01-28 NOTE — Progress Notes (Signed)
Patient's output 0 last night . AM bladder scan revealed 89cc. Will continue to monitor output.

## 2022-01-28 NOTE — Progress Notes (Signed)
OT Cancellation Note  Patient Details Name: Virginia [name] MRN: [id number] DOB: 03/21/1936   Cancelled Treatment:     OT screened, no needs identified, will sign off (pt total assist and bed level at baseline without current acute needs per PT, PT discussed with and confirmed with daughter on phone)   Corinne Ports E. Calbert Hulsebus, OTR/L Acute Rehabilitation Services [phone] [place] 01/28/2022, 10:03 AM

## 2022-01-28 NOTE — Progress Notes (Signed)
Speech Language Pathology Treatment: Dysphagia  Patient Details Name: Virginia [name] MRN: [id number] DOB: [birth year] Today's Date: 01/28/2022 Time: 1025-8527 SLP Time Calculation (min) (ACUTE ONLY): 13 min  Assessment / Plan / Recommendation Clinical Impression  Pt was a little more alert this morning but still not very accepting of POs offered. She did not take any ice chips or nectar thick liquids regardless of bolus delivery method. She tried to take small amounts of water only off a spoon, but whatever she did get into her mouth spilled anteriorly back out of it. The only consistency she was able to swallow was purees, and even then she took a limited number of bites before she stopped accepting them. Timing from presentation of bolus until palpation of hyoid movement to suggest swallow initiation fluctuated anywhere from 7 seconds to 20 seconds per bolus. Although there are some subtle improvements noted, she is still not appropriate for a PO diet. Given mentation and baseline risk factors, pt is likely to have some level of chronic risk for aspiration and as well as malnutrition/dehydration. As family considers [place], may also want to consider the option of comfort feeds.   HPI HPI: Pt is an 86 yo female presenting with cough, fever, and AMS. Pt with recent COVID infection in late January. Admitted with sepsis due to PNA. Pt had a prior swallow eval in 2016 with chronic pill dysphagia noted as well as a cough after dry solids. Regular solids and thin liquids were recommended at that time. UGI in 2018 revealed delayed passage of the barium tablet, which was held up in the distal esophagus without discrete stricture identified, as well as small HH. PMH includes: GERD, COPD Gold stage IV, chronic hypoxic respite failure on 2 L 24/7, chronic dysphagia on soft diet, advanced dementia, functional paraplegia and bedbound secondary to hip fractures, CKAD stage III, HTN      SLP Plan  Continue with current  plan of care      Recommendations for follow up therapy are one component of a multi-disciplinary discharge planning process, led by the attending physician.  Recommendations may be updated based on patient status, additional functional criteria and insurance authorization.    Recommendations  Diet recommendations: NPO Medication Administration: Via alternative means                Oral Care Recommendations: Oral care QID Follow Up Recommendations: Home health SLP Assistance recommended at discharge: Frequent or constant Supervision/Assistance SLP Visit Diagnosis: Dysphagia, unspecified (R13.10) Plan: Continue with current plan of care           Osie Bond., M.A. [place] [facility] Rehabilitation Services Pager [phone] Office [phone]  01/28/2022, 10:00 AM

## 2022-01-28 NOTE — Progress Notes (Incomplete)
Daily Progress Note   Patient Name: Virginia [name]       Date: 01/28/2022 DOB: February 26, 1936  Age: 86 y.o. MRN#: [id number] Attending Physician: Elmarie Shiley, MD Primary Care Physician: Lauree Chandler, NP Admit Date: 01/26/2022  Reason for Consultation/Follow-up: {Reason for Consult:23484}  Subjective: ***  Length of Stay: 2  Current Medications: Scheduled Meds:   aspirin  300 mg Rectal Daily   budesonide  0.5 mg Nebulization BID   enoxaparin (LOVENOX) injection  40 mg Subcutaneous Q24H   fluticasone  1 spray Each Nare Daily   ipratropium-albuterol  3 mL Nebulization BID   pantoprazole (PROTONIX) IV  40 mg Intravenous Q24H    Continuous Infusions:  dextrose 75 mL/hr at 01/27/22 1145   doxycycline (VIBRAMYCIN) IV 100 mg (01/27/22 2228)   piperacillin-tazobactam (ZOSYN)  IV 3.375 g (01/28/22 0914)   valproate sodium 125 mg (01/28/22 1145)    PRN Meds: acetaminophen, acetaminophen, albuterol, hydrALAZINE, OLANZapine  Physical Exam          Vital Signs: BP (!) 149/81    Pulse 72    Temp 99.1 F (37.3 C) (Oral)    Resp 15    Ht 5\' 2"  (1.575 m)    Wt 55.2 kg    LMP  (LMP Unknown)    SpO2 95%    BMI 22.26 kg/m  SpO2: SpO2: 95 % O2 Device: O2 Device: Nasal Cannula O2 Flow Rate: O2 Flow Rate (L/min): 2 L/min  Intake/output summary:  Intake/Output Summary (Last 24 hours) at 01/28/2022 1223 Last data filed at 01/27/2022 2000 Gross per 24 hour  Intake 0 ml  Output 250 ml  Net -250 ml   LBM:   Baseline Weight: Weight: 63.5 kg Most recent weight: Weight: 55.2 kg       Palliative Assessment/Data:      Patient Active Problem List   Diagnosis Date Noted   Dehydration 01/27/2022   Hypernatremia 01/27/2022   Hypokalemia 31/54/0086   Acute metabolic encephalopathy  76/19/5093   Sepsis ([place]) 01/27/2022   Dysphagia 01/27/2022   Goals of care, counseling/discussion    Pneumonia 01/26/2022   Acute encephalopathy    Displaced intertrochanteric fracture of right femur ([place]) 04/21/2019   Closed intertrochanteric fracture of hip, right, initial encounter ([place]) 04/20/2019   Leukocytosis 04/20/2019   Fall at home,  initial encounter 04/20/2019   Primary osteoarthritis involving multiple joints 07/08/2017   Dementia without behavioral disturbance ([place]) 07/08/2017   Anxiety 07/08/2017   Gastroesophageal reflux disease 07/08/2017   Hypotension 02/01/2017   Chronic kidney disease, stage 3b ([place]) 02/01/2017   Cough 01/31/2016   Vitamin D deficiency 02/15/2015   Hyperlipidemia 01/18/2015   COPD GOLD IV 12/30/2014   Chronic diastolic CHF (congestive heart failure) ([place]) 12/08/2014   Dyspnea    Hyponatremia 12/02/2014   Essential hypertension 12/02/2014   Community acquired pneumonia 11/30/2014   Nausea and vomiting 11/30/2014   Memory disorder 10/24/2014   Carotid artery disease ([place]) 10/19/2012    Palliative Care Assessment & Plan   Patient Profile: ***  Assessment: ***  Recommendations/Plan: ***  Goals of Care and Additional Recommendations: Limitations on Scope of Treatment: {Recommended Scope and Preferences:21019}  Code Status:    Code Status Orders  (From admission, onward)           Start     Ordered   01/26/22 1757  Full code  Continuous        01/26/22 1757           Code Status History     Date Active Date Inactive Code Status Order ID Comments User Context   06/08/2020 2354 06/10/2020 1840 Full Code [id number]  Janet [facility]   04/20/2019 0936 04/27/2019 1742 Full Code [id number]  Norval Morton, MD Inpatient   02/01/2017 1844 02/03/2017 1544 Full Code [id number]  Samella Parr, NP Inpatient   12/01/2014 0910 12/09/2014 1815 Full Code [id number]  Precious Reel, MD Inpatient   11/30/2014 1208 12/01/2014 0910 Full Code  [id number]  Sheela Stack, MD Inpatient       Prognosis:  {Palliative Care Prognosis:23504}  Discharge Planning: {Palliative dispostion:23505}  Care plan was discussed with ***  Thank you for allowing the Palliative Medicine Team to assist in the care of this patient.   Time In: *** Time Out: *** Total Time *** Prolonged Time Billed  {YES NO:22349}       Greater than 50%  of this time was spent counseling and coordinating care related to the above assessment and plan.  Elmin Wiederholt Johnnette Litter, PA-C  Please contact Palliative Medicine Team phone at [phone] for questions and concerns.

## 2022-01-28 NOTE — Progress Notes (Signed)
PT Cancellation Note  Patient Details Name: JAEDYN MARRUFO MRN: [id number] DOB: 08/25/1936   Cancelled Treatment:    Reason Eval/Treat Not Completed: PT screened, no needs identified, will sign off (pt total assist and bed level at baseline without current acute needs, discussed with and confirmed with daughter on phone)   Lamarr Lulas 01/28/2022, 9:58 AM [place] Pager: [phone] Office: [phone]

## 2022-01-29 DIAGNOSIS — G9341 Metabolic encephalopathy: Secondary | ICD-10-CM

## 2022-01-29 DIAGNOSIS — R1310 Dysphagia, unspecified: Secondary | ICD-10-CM

## 2022-01-29 DIAGNOSIS — F0390 Unspecified dementia without behavioral disturbance: Secondary | ICD-10-CM

## 2022-01-29 DIAGNOSIS — N1832 Chronic kidney disease, stage 3b: Secondary | ICD-10-CM

## 2022-01-29 LAB — CBC
HCT: 37.8 % (ref 36.0–46.0)
Hemoglobin: 11.8 g/dL — ABNORMAL LOW (ref 12.0–15.0)
MCH: 29.8 pg (ref 26.0–34.0)
MCHC: 31.2 g/dL (ref 30.0–36.0)
MCV: 95.5 fL (ref 80.0–100.0)
Platelets: 142 10*3/uL — ABNORMAL LOW (ref 150–400)
RBC: 3.96 MIL/uL (ref 3.87–5.11)
RDW: 13.2 % (ref 11.5–15.5)
WBC: 8.4 10*3/uL (ref 4.0–10.5)
nRBC: 0 % (ref 0.0–0.2)

## 2022-01-29 LAB — BLOOD CULTURE ID PANEL (REFLEXED) - BCID2

## 2022-01-29 LAB — BASIC METABOLIC PANEL
Anion gap: 9 (ref 5–15)
BUN: 22 mg/dL (ref 8–23)
CO2: 26 mmol/L (ref 22–32)
Calcium: 8.2 mg/dL — ABNORMAL LOW (ref 8.9–10.3)
Chloride: 106 mmol/L (ref 98–111)
Creatinine, Ser: 0.69 mg/dL (ref 0.44–1.00)
GFR, Estimated: >60 mL/min (ref >60)
Glucose, Bld: 93 mg/dL (ref 70–99)
Potassium: 3.3 mmol/L — ABNORMAL LOW (ref 3.5–5.1)
Sodium: 141 mmol/L (ref 135–145)

## 2022-01-29 LAB — CULTURE, BLOOD (ROUTINE X 2): Special Requests: ADEQUATE

## 2022-01-29 MED ORDER — POTASSIUM CHLORIDE CRYS ER 20 MEQ PO TBCR
40.0000 meq | EXTENDED_RELEASE_TABLET | Freq: Once | ORAL | Status: AC
  Administered 2022-01-29: 40 meq via ORAL
  Filled 2022-01-29: qty 2

## 2022-01-29 MED ORDER — POTASSIUM CHLORIDE 10 MEQ/100ML IV SOLN
10.0000 meq | INTRAVENOUS | Status: DC
  Administered 2022-01-29: 10 meq via INTRAVENOUS
  Filled 2022-01-29: qty 100

## 2022-01-29 NOTE — Progress Notes (Signed)
Speech Language Pathology Treatment: Dysphagia  ?Patient Details ?Name: Virginia [name] ?MRN: [id number] ?DOB: 20-Sep-1936 ?Today's Date: 01/29/2022 ?Time: 3474-2595 ?SLP Time Calculation (min) (ACUTE ONLY): 14 min ? ?Assessment / Plan / Recommendation ?Clinical Impression ? Today she is more alert, accepting po's more willingly with a faster swallow response and caregiver at bedside. Total consumption was 75% container of applesauce and sips water from straw. There may have been decreased airway protection with initial sip as she immediately inhaled and pushed shoulders back with multiple swallows and watery eyes. Remainder of trials appeared to be timely and coordinated. Yesterday there was up to 20 second delay before swallow detected and today initiation thought to be adequate. Recommending puree (Dys 1) texture, thin liquids via cup or straw, small sips with full supervision. Caregiver trained on swallow precautions and that aspiration cannot be ruled out with observation only. Will continue to follow ?  ?HPI HPI: Pt is an 86 yo female presenting with cough, fever, and AMS. Pt with recent COVID infection in late January. Admitted with sepsis due to PNA. Pt had a prior swallow eval in 2016 with chronic pill dysphagia noted as well as a cough after dry solids. Regular solids and thin liquids were recommended at that time. UGI in 2018 revealed delayed passage of the barium tablet, which was held up in the distal esophagus without discrete stricture identified, as well as small HH. PMH includes: GERD, COPD Gold stage IV, chronic hypoxic respite failure on 2 L 24/7, chronic dysphagia on soft diet, advanced dementia, functional paraplegia and bedbound secondary to hip fractures, CKAD stage III, HTN ?  ?   ?SLP Plan ? Continue with current plan of care ? ?  ?  ?Recommendations for follow up therapy are one component of a multi-disciplinary discharge planning process, led by the attending physician.  Recommendations may be  updated based on patient status, additional functional criteria and insurance authorization. ?  ? ?Recommendations  ?Diet recommendations: Dysphagia 1 (puree);Thin liquid ?Liquids provided via: Straw;Cup ?Medication Administration: Crushed with puree ?Supervision: Staff to assist with self feeding;Full supervision/cueing for compensatory strategies ?Compensations: Minimize environmental distractions ?Postural Changes and/or Swallow Maneuvers: Seated upright 90 degrees  ?   ?    ?   ? ? ? ? Oral Care Recommendations: Oral care BID ?Follow Up Recommendations: No SLP follow up ?SLP Visit Diagnosis: Dysphagia, unspecified (R13.10) ?Plan: Continue with current plan of care ? ? ? ? ?  ?  ? ? ?Houston Siren ? ?01/29/2022, 11:29 AM ?

## 2022-01-29 NOTE — Progress Notes (Signed)
?Progress Note ? ? ?Patient: Virginia [name] ION:[id number] DOB: [birth year] DOA: 01/26/2022     3 ?DOS: the patient was seen and examined on 01/29/2022 ?  ?Brief hospital course: ?86 year old past medical history significant for COPD Gold stage IV, chronic hypoxic respiratory failure on 2 L of oxygen, chronic dysphagia on soft diet, advanced dementia, functional paraplegia and bedbound secondary to hip fractures, CKD stage III, hypertension presents with fever, increasing cough and mental status changes. ? ?Patient was in hospice care at home since last year, after she had hip fracture which made her bedbound, developed worsening dementia.  Per report her condition has a slowly improved and family took her off of hospice and rescinded DNR she was made full code. ? ?Patient was having increasingly more cough, develops fever and become more lethargic, brought by family for further evaluation.  ? ?X-ray: Subsegmental atelectasis in the right lung base, stable left costophrenic angle blunting. ? ?Patient 's son and daughter disagree on goals of care, palliative care helping with discussion.  ? ?She passed swallow evaluation. Monitor on diet. Might be able to be discharge soon.  ? ?Assessment and Plan: ?Dysphagia ?Acute on chronic  Speech continue to follow-up.  Continue with IV fluids. ?Speech evaluated patient today, started on Dysphagia 1 diet.  ? ?Sepsis ([place]) ?Present with fever, tachypnea, tachycardia, source of infection probably aspiration pneumonia.  ?Continue with IV antibiotics. ? ? ?Acute metabolic encephalopathy ?In the setting of fever, infection.  Also underlying dementia. She is non verbal at baseline.  ?Continue with supportive care ?More  alert, non verbal.  ? ?Hypokalemia ?Replaced IV ? ?Hypernatremia ?Improving with IV fluids, D 5.   ?-Resolved.  ? ?Dehydration ?Likely secondary to poor oral intake. ?Continue with IV fluids. Improved.  ? ?Goals of care, counseling/discussion ?Palliative  care has been  consulted. ?Patient was under hospice cares since last year, family took her off of hospice because she was slowly improving. ?Patient 's son and daughter disagree in goals of care. Palliative care assisting. ?Daughter will be here tonight, Josseline with palliative will see patient 3/02. ? ?Dementia without behavioral disturbance ([place]) ?Continue with Zyprexa as needed. ? ?Chronic kidney disease, stage 3b ([place]) ?Stable.  Monitor ? ?Community acquired pneumonia ?Patient presented with fever, cough, initial chest x-ray showed right lung base atelectasis. ?She is at risk for aspiration pneumonia, will continue with IV Zosyn and doxycycline. ?1 out of 4 blood cultures positive GPC with PCIT detecting MSSE.  Already on Doxy. Likely contaminate.  ?Passed swallow evaluation today  ?Palliative care consultation for goals of care. Appreciate Palliative care assistance.  ? ? ?Code status; Awaiting POA paper from son and daughter. Palliative following.  ? ? ? ? ? ?Subjective: alert, does not follows command ? ?Physical Exam: ?Vitals:  ? 01/29/22 0732 01/29/22 2440 01/29/22 0746 01/29/22 0755  ?BP:   (!) 148/54   ?Pulse:   62   ?Resp:   11   ?Temp:      ?TempSrc:      ?SpO2: 100% 100% 100% 98%  ?Weight:      ?Height:      ? ?General; alert. Non verbal.  ?CVS; S 1, S 2 RRR ? ?Data Reviewed: ? ?Bmet reviewed.  ? ?Family Communication: Brother who was at bedside.  ? ?Disposition: ?Status is: Inpatient ?Remains inpatient appropriate because: Home soon.  ? ? ? ? ? ? ? ? ? Planned Discharge Destination: Home ? ? ? ? ?Time spent: 45 minutes ? ?Author: Cassie Freer  Ruberta Holck, MD ?01/29/2022 2:45 PM ? ?For on call review [email or web address].  ? ?

## 2022-01-29 NOTE — Plan of Care (Signed)
  Problem: Activity: Goal: Ability to tolerate increased activity will improve Outcome: Progressing   Problem: Clinical Measurements: Goal: Ability to maintain a body temperature in the normal range will improve Outcome: Progressing   Problem: Respiratory: Goal: Ability to maintain adequate ventilation will improve Outcome: Progressing Goal: Ability to maintain a clear airway will improve Outcome: Progressing   

## 2022-01-29 NOTE — Progress Notes (Signed)
?  X-cover Note: ?Pt could not tolerate IV potassium. Switched to po kcl. ? ? ?Kristopher Oppenheim, DO ?[facility] ? ?

## 2022-01-30 ENCOUNTER — Other Ambulatory Visit (HOSPITAL_COMMUNITY): Payer: Self-pay

## 2022-01-30 DIAGNOSIS — E860 Dehydration: Secondary | ICD-10-CM

## 2022-01-30 MED ORDER — AMOXICILLIN-POT CLAVULANATE 875-125 MG PO TABS
1.0000 | ORAL_TABLET | Freq: Two times a day (BID) | ORAL | 0 refills | Status: AC
  Filled 2022-01-30: qty 8, 4d supply, fill #0

## 2022-01-30 MED ORDER — PANTOPRAZOLE SODIUM 40 MG PO TBEC: 40.0000 mg | DELAYED_RELEASE_TABLET | Freq: Every day | ORAL | Status: DC

## 2022-01-30 MED ORDER — PANTOPRAZOLE SODIUM 40 MG PO TBEC
40.0000 mg | DELAYED_RELEASE_TABLET | Freq: Every day | ORAL | 0 refills | Status: DC
  Filled 2022-01-30: qty 30, 30d supply, fill #0

## 2022-01-30 NOTE — Progress Notes (Addendum)
Speech Language Pathology Treatment: Dysphagia  ?Patient Details ?Name: Virginia [name] ?MRN: [id number] ?DOB: November 10, 1936 ?Today's Date: 01/30/2022 ?Time: 0160-1093 ?SLP Time Calculation (min) (ACUTE ONLY): 31 min ? ?Assessment / Plan / Recommendation ?Clinical Impression ? Pt was working on her lunch meal upon SLP arrival with caregiver and daughter assisting with feeding. A congested cough was noted but she does also have a baseline congested cough, and she did not eat or drink much with SLP present to observe over a larger amount of intake. Her daughter had to step out to speak with palliative care, so education was primarily reviewed with caregiver at bedside. Education included strategies to maximize safety and nutritional intake when she says she does not typically eat or drink much at home. She denies any overt difficulty with swallowing PTA and denies having trouble so far today with purees and thin liquids. Per her description, it sounds like she primarily gets soft, finely chopped foods at home. Pt was currently showing signs of pain (RN made aware) and not wanting to eat or drink more, so no advanced trials were given. Will continue to follow as able.  ?  ?HPI HPI: Pt is an 86 yo female presenting with cough, fever, and AMS. Pt with recent COVID infection in late January. Admitted with sepsis due to PNA. Pt had a prior swallow eval in 2016 with chronic pill dysphagia noted as well as a cough after dry solids. Regular solids and thin liquids were recommended at that time. UGI in 2018 revealed delayed passage of the barium tablet, which was held up in the distal esophagus without discrete stricture identified, as well as small HH. PMH includes: GERD, COPD Gold stage IV, chronic hypoxic respite failure on 2 L 24/7, chronic dysphagia on soft diet, advanced dementia, functional paraplegia and bedbound secondary to hip fractures, CKAD stage III, HTN ?  ?   ?SLP Plan ? Continue with current plan of care ? ?  ?   ?Recommendations for follow up therapy are one component of a multi-disciplinary discharge planning process, led by the attending physician.  Recommendations may be updated based on patient status, additional functional criteria and insurance authorization. ?  ? ?Recommendations  ?Diet recommendations: Dysphagia 1 (puree);Thin liquid ?Liquids provided via: Straw;Cup ?Medication Administration: Crushed with puree ?Supervision: Staff to assist with self feeding;Full supervision/cueing for compensatory strategies ?Compensations: Minimize environmental distractions ?Postural Changes and/or Swallow Maneuvers: Seated upright 90 degrees  ?   ?    ?   ? ? ? ? Oral Care Recommendations: Oral care BID ?Follow Up Recommendations: No SLP follow up ?Assistance recommended at discharge: Frequent or constant Supervision/Assistance ?SLP Visit Diagnosis: Dysphagia, unspecified (R13.10) ?Plan: Continue with current plan of care ? ? ? ? ?  ?  ? ? ?Osie Bond., M.A. CCC-SLP ?Acute Rehabilitation Services ?Pager [phone] ?Office [phone] ? ?01/30/2022, 1:11 PM ?

## 2022-01-30 NOTE — Progress Notes (Signed)
Patient discharged to [facility].  AVS information given to PTAR (reviewed via telephone with patients son [name]) and another copy of AVS given and reviewed with patients daughter.  No additional questions.   ?

## 2022-01-30 NOTE — Progress Notes (Signed)
? ?                                                                                                                                                     ?                                                   ?Daily Progress Note  ? ?Patient Name: Virginia [name]       Date: 01/30/2022 ?DOB: 1936/07/15  Age: 86 y.o. MRN#: [id number] ?Attending Physician: Nolberto Hanlon, MD ?Primary Care Physician: Lauree Chandler, NP ?Admit Date: 01/26/2022 ? ?Reason for Consultation/Follow-up: Establishing goals of care, Interfamily conflict, and Psychosocial/spiritual support ? ?Subjective: ?Medical records reviewed. Patient assessed at the bedside. Appears to be closer to baseline mental status, mumbling to communicate with a few comprehensible words. No family present during my visit.  ? ?I called patient's son [name] to provide updates and education regarding advanced dementia.  We reviewed patient's progress with SLP and he is interested in continuing this at discharge.  Upon reviewing goals of care further, he continues to desire full code and at least one attempt of cardiopulmonary resuscitation if indicated. ? ?I called patient's daughter [name] and left voicemail this morning.  I then received update from RN this afternoon informing me [name] is at the bedside.  I met [name] and we looked over her paperwork, which looked to be durable POA.  Discussed her concerns regarding patient's pain in her right shoulder and knee, desire for care team calling her in addition to patient's son for medical decision making, and whether patient is truly ready to discharge home today.  We reviewed the availability of oral antibiotics and pain management at home, also reviewing patient's condition as stated above.  Emotional support and therapeutic listening was provided.  She is dissatisfied with current palliative care and desires a referral to hospice of the [place]. ? ?Questions and concerns addressed. PMT will continue to support holistically.   ? ?Length of Stay: 4 ? ?Current Medications: ?Scheduled Meds:  ? aspirin  300 mg Rectal Daily  ? budesonide  0.5 mg Nebulization BID  ? enoxaparin (LOVENOX) injection  40 mg Subcutaneous Q24H  ? fluticasone  1 spray Each Nare Daily  ? ipratropium-albuterol  3 mL Nebulization BID  ? pantoprazole  40 mg Oral QHS  ? ? ?Continuous Infusions: ? dextrose 50 mL/hr at 01/29/22 2119  ? doxycycline (VIBRAMYCIN) IV 100 mg (01/29/22 2301)  ? piperacillin-tazobactam (ZOSYN)  IV 3.375 g (01/30/22 1012)  ? ? ?PRN Meds: ?acetaminophen, acetaminophen, albuterol, hydrALAZINE, OLANZapine ? ?Physical Exam ?Vitals and nursing note reviewed.  ?Constitutional:   ?   General: She is  not in acute distress. ?   Interventions: Nasal cannula in place.  ?   Comments: nonverbal  ?Cardiovascular:  ?   Rate and Rhythm: Normal rate and regular rhythm.  ?Pulmonary:  ?   Effort: Pulmonary effort is normal. No respiratory distress.  ?Skin: ?   General: Skin is warm and dry.  ?Neurological:  ?   Mental Status: She is alert. Mental status is at baseline.  ?         ? ?Vital Signs: BP (!) 163/115   Pulse 92   Temp 98.9 ?F (37.2 ?C) (Oral)   Resp 17   Ht _0  (1.575 m)   Wt 55.7 kg   LMP  (LMP Unknown)   SpO2 98%   BMI 22.46 kg/m?  ?SpO2: SpO2: 98 % ?O2 Device: O2 Device: Nasal Cannula ?O2 Flow Rate: O2 Flow Rate (L/min): 2 L/min ? ?Intake/output summary:  ?Intake/Output Summary (Last 24 hours) at 01/30/2022 1104 ?Last data filed at 01/30/2022 8299 ?Gross per 24 hour  ?Intake 2771.16 ml  ?Output 800 ml  ?Net 1971.16 ml  ? ? ?LBM: Last BM Date : 01/29/22 ?Baseline Weight: Weight: 63.5 kg ?Most recent weight: Weight: 55.7 kg ? ?     ?Palliative Assessment/Data: 20% ? ? ? ? ? ?Patient Active Problem List  ? Diagnosis Date Noted  ? Dehydration 01/27/2022  ? Hypernatremia 01/27/2022  ? Hypokalemia 01/27/2022  ? Acute metabolic encephalopathy 37/16/9678  ? Sepsis ([place]) 01/27/2022  ? Dysphagia 01/27/2022  ? Goals of care, counseling/discussion   ?  Pneumonia 01/26/2022  ? Acute encephalopathy   ? Displaced intertrochanteric fracture of right femur ([place]) 04/21/2019  ? Closed intertrochanteric fracture of hip, right, initial encounter ([place]) 04/20/2019  ? Leukocytosis 04/20/2019  ? Fall at home, initial encounter 04/20/2019  ? Primary osteoarthritis involving multiple joints 07/08/2017  ? Dementia without behavioral disturbance ([place]) 07/08/2017  ? Anxiety 07/08/2017  ? Gastroesophageal reflux disease 07/08/2017  ? Hypotension 02/01/2017  ? Chronic kidney disease, stage 3b ([place]) 02/01/2017  ? Cough 01/31/2016  ? Vitamin D deficiency 02/15/2015  ? Hyperlipidemia 01/18/2015  ? COPD GOLD IV 12/30/2014  ? Chronic diastolic CHF (congestive heart failure) ([place]) 12/08/2014  ? Dyspnea   ? Hyponatremia 12/02/2014  ? Essential hypertension 12/02/2014  ? Community acquired pneumonia 11/30/2014  ? Nausea and vomiting 11/30/2014  ? Memory disorder 10/24/2014  ? Carotid artery disease ([place]) 10/19/2012  ? ? ?Palliative Care Assessment & Plan  ? ?Patient Profile: ?86 y.o. female  with past medical history of Gold stage IV, chronic hypoxic respite failure on 2 L 24/7, chronic dysphagia on soft diet, advanced dementia, functional paraplegia and bedbound secondary to hip fractures, CKD stage III, HTN admitted on 01/26/2022 with fever, increasing cough and mentation changes.  ?  ?Patient was in hospice care at home since last year after her hip fracture which made her bed bound and worsening of dementia, but her condition has slowly improving and family took her off hospice and on palliative care, also rescinded DNR to Full Code last Wednesday. PMT has been consulted to assist with goals of care conversation. ? ? ?Assessment: ?Advanced dementia ?Goals of care conversation ? ? ?Recommendations/Plan: ?I have yet to receive HCPOA documentation from either of patient's 2 adult children ?Patient's son was reached by phone this morning, continues to wish for full code/full scope treatment  and discharge home with palliative care ?Patient's daughter arrived this afternoon concerned about pain management, nutrition, quality of life and most recent  labs.  Shared her concerns with patient's attending ?Psychosocial and emotional support provided ?Discussed daughter's wish to switch to hospice of the [facility] palliative care with [facility], assistance appreciated ?Will continue to attempt to mediate difficult family dynamics and support patient's goals ? ?Prognosis: ? Guarded ? ?Discharge Planning: ?Home with Palliative Services ? ?Care plan was discussed with Dr. Kurtis Bushman, Tomi Bamberger NCM, patient's son [name] and patient's daughter [name] ? ?Total time: ?I spent 50 minutes in the care of the patient today in the above activities and documenting the encounter. ? ?MDM: High ? ? ?Dorthy Cooler, PA-C ?Palliative Medicine Team ?Team phone # [phone] ? ?Thank you for allowing the Palliative Medicine Team to assist in the care of this patient. Please utilize secure chat with additional questions, if there is no response within 30 minutes please call the above phone number. ? ?Palliative Medicine Team providers are available by phone from 7am to 7pm daily and can be reached through the team cell phone.  ?Should this patient require assistance outside of these hours, please call the patient's attending physician.  ? ? ? ?

## 2022-01-30 NOTE — Progress Notes (Signed)
Called patient's son [name] to review AVS information, including medications using teach-back method.  Patients son verbalized understanding with no additional questions.  AVS packet at nursing station for EMS pick-up.   ?

## 2022-01-30 NOTE — Care Management Important Message (Signed)
Important Message ? ?Patient Details  ?Name: Virginia [name] ?MRN: [id number] ?Date of Birth: 12/17/1935 ? ? ?Medicare Important Message Given:  Yes ? ? ? ? ?Shelda Altes ?01/30/2022, 2:27 PM ?

## 2022-01-30 NOTE — Discharge Summary (Signed)
Virginia [name] OVF:[id number] DOB: 1936-03-04 DOA: 01/26/2022  PCP: Lauree Chandler, NP  Admit date: 01/26/2022 Discharge date: 01/30/2022  Admitted From: home Disposition:  home  Recommendations for Outpatient Follow-up:  Follow up with PCP in 1 week Please obtain BMP/CBC in one week   Home Health:yes    Discharge Condition:Stable CODE STATUS:full  Diet recommendation: Dysphagia 1    Brief/Interim Summary: Per HPI: Virginia [name] is a 86 y.o. female with medical history significant of COPD Gold stage IV, chronic hypoxic respite failure on 2 L 24/7, chronic dysphagia on soft diet, advanced dementia, functional paraplegia and bedbound secondary to hip fractures, CKAD stage III, HTN sent for fever, increasing cough and mentation changes. Patient was in hospice care at home since last year after her hip fracture which made her bed bound and worsening of dementia, but her condition has slowly improving and family took her off hospice and rescinded DNR to Full Code last Wednesday.  At baseline, patient need to be fed with soft diet and use straw to drink, " occasional cough" was reported by family in the last year but no aspiration pneumonia in more than 1 year. Palliative is still following.Patient was tested positive for COVID late January (Daughter and son both tested positive at the same time) , but she did not receive any outpatient treatment at that time.  Since then patient has developed a cough which has been dry.  Starting this Monday, son noticed the patient started to have significant more cough, "sounds very gurgly when breathing", and her mentation has regularly deteriorated.  More sleepy and has not able to wake up to eat or drink for last 2 days. Then, patient started to have a fever  ED Course: Fever 101.7, blood pressure elevated, no tachycardia.  O2 saturation 95% on 2 L. Cxr results below. She was found with Na level 152. She was admitted for further care.  She was given IVF and  started on IV antibiotics for pneumonia . Today she is more communicative/awake. Per son , she is close to her baseline. Stable for discharge home,. During her hospitalization palliative was following and had discussion about code status as the children disagreed on her code status.   Dysphagia Acute on chronic   SPLD was consulted and will follow-up on discharge  Started on dysphagia 1 diet  Diet recommendations: Dysphagia 1 (puree);Thin liquid Liquids provided via: Straw;Cup Medication Administration: Crushed with puree Supervision: Staff to assist with self feeding;Full supervision/cueing for compensatory strategies Compensations: Minimize environmental distractions Postural Changes and/or Swallow Maneuvers: Seated upright 90 degrees           Sepsis ([place]) Present with fever, tachypnea, tachycardia, source of infection probably aspiration pneumonia.  Treated with IV antibiotics and transition to p.o. to complete course     Acute metabolic encephalopathy In the setting of fever, infection.  Also underlying dementia. She is non verbal at baseline.  She is at baseline now with treatment of infection     Hypokalemia Placed   Hypernatremia Resolved with IV fluids   Dehydration Likely secondary to poor oral intake. Improved with IV fluids   Goals of care, counseling/discussion Palliative  care has been consulted. Patient was under hospice cares since last year, family took her off of hospice because she was slowly improving. Patient 's son and daughter disagree in goals of care. Palliative care assisting.    Dementia without behavioral disturbance (HCC) Previously on dementia medication Aricept and Namenda but son stated it  was discontinued since she was on hospice before  They did not feel it was making much of a difference.     Chronic kidney disease, stage 3b (HCC) Stable.     Aspiration Pneumonia v.s. CAP Treated with iv abx Transition to po abx to complete  course     Discharge Diagnoses:  Principal Problem:   Pneumonia Active Problems:   Community acquired pneumonia   Chronic kidney disease, stage 3b ([place])   Dementia without behavioral disturbance ([place])   Goals of care, counseling/discussion   Dehydration   Hypernatremia   Hypokalemia   Acute metabolic encephalopathy   Sepsis ([place])   Dysphagia    Discharge Instructions  Discharge Instructions     Call MD for:  temperature >100.4   Complete by: As directed    Diet - low sodium heart healthy   Complete by: As directed    Dysphagia one.   Discharge instructions   Complete by: As directed    F/u with pcp in one week Make sure she is upright during meals.   Increase activity slowly   Complete by: As directed       Allergies as of 01/30/2022       Reactions   Lidocaine Anaphylaxis, Swelling, Rash, Other (See Comments)   Any of the " [facility] "   Other Other (See Comments)   All drugs that end in "cane'-  novacane etc. Daughter states patient almost died when she had it when she was born        Medication List     STOP taking these medications    divalproex 125 MG capsule Commonly known as: DEPAKOTE SPRINKLE   donepezil 10 MG tablet Commonly known as: ARICEPT   furosemide 20 MG tablet Commonly known as: LASIX   memantine 10 MG tablet Commonly known as: NAMENDA   QUEtiapine 25 MG tablet Commonly known as: SEROQUEL   rosuvastatin 20 MG tablet Commonly known as: CRESTOR   sertraline 50 MG tablet Commonly known as: ZOLOFT       TAKE these medications    acetaminophen 160 MG/5ML liquid Commonly known as: TYLENOL Take 320 mg by mouth in the morning and at bedtime. What changed: Another medication with the same name was removed. Continue taking this medication, and follow the directions you see here.   amoxicillin-clavulanate 875-125 MG tablet Commonly known as: Augmentin Take 1 tablet by mouth every 12 (twelve) hours for 4 days.    budesonide 0.25 MG/2ML nebulizer solution Commonly known as: PULMICORT Take 0.25 mg by nebulization 2 (two) times daily. What changed: Another medication with the same name was removed. Continue taking this medication, and follow the directions you see here.   Carbinoxamine Maleate 4 MG Tabs TAKE 1 TABLET IN MORNING AND 1 AT BEDTIME What changed: See the new instructions.   CORICIDIN HBP PO Take 30 mLs by mouth in the morning and at bedtime. Liquid   fluticasone 50 MCG/ACT nasal spray Commonly known as: Flonase Use 1 spray each nostril once a day as needed for stuffy nose   guaiFENesin 100 MG/5ML liquid Commonly known as: ROBITUSSIN Take 400 mg by mouth in the morning and at bedtime.   ipratropium-albuterol 0.5-2.5 (3) MG/3ML Soln Commonly known as: DUONEB USE 1 VIAL IN NEBULIZER EVERY 6 HOURS - and as needed What changed: See the new instructions.   pantoprazole 40 MG tablet Commonly known as: PROTONIX Take 1 tablet (40 mg total) by mouth daily. What changed: See the  new instructions.   propranolol 20 MG tablet Commonly known as: INDERAL TAKE 1 TABLET(20 MG) BY MOUTH TWICE DAILY What changed: See the new instructions.        Follow-up Information     Lauree Chandler, NP Follow up on 02/04/2022.   Specialty: Geriatric Medicine Why: @11 :00 with Dr.Miller Contact information: [place]. [place] [address] [phone]         [place] Follow up.   Why: HHR[facility], [employer]- Agency will contact you with apt time               Allergies  Allergen Reactions   Lidocaine Anaphylaxis, Swelling, Rash and Other (See Comments)    Any of the " [facility] "   Other Other (See Comments)    All drugs that end in "cane'-  novacane etc. Daughter states patient almost died when she had it when she was born    Consultations: Palliative care   Procedures/Studies: CT Head Wo Contrast  Result Date: 01/26/2022 CLINICAL DATA:  Altered mental  status EXAM: CT HEAD WITHOUT CONTRAST TECHNIQUE: Contiguous axial images were obtained from the base of the skull through the vertex without intravenous contrast. RADIATION DOSE REDUCTION: This exam was performed according to the departmental dose-optimization program which includes automated exposure control, adjustment of the mA and/or kV according to patient size and/or use of iterative reconstruction technique. COMPARISON:  06/08/2020 FINDINGS: Brain: No acute intracranial findings are seen. There are no signs of bleeding. There is prominence of third and both lateral ventricles. Cortical sulci are prominent. There is decreased density in the subcortical and periventricular white matter in both cerebral hemispheres. Overall, no significant interval changes are noted. Vascular: Unremarkable. Skull: Unremarkable. Sinuses/Orbits: There is mucosal thickening in the ethmoid and maxillary sinuses. There is fluid density in the left mastoid air cells. Other: None IMPRESSION: No acute intracranial findings are seen in noncontrast CT brain. Atrophy. Small-vessel disease. Chronic ethmoid and maxillary sinusitis. There is left mastoid effusion. Electronically Signed   By: Elmer Picker M.D.   On: 01/26/2022 17:00   DG CHEST PORT 1 VIEW  Result Date: 01/27/2022 CLINICAL DATA:  Pneumonia.  Cough.  COPD.  Hypertension. EXAM: PORTABLE CHEST 1 VIEW COMPARISON:  AP chest 01/26/2022 FINDINGS: Cardiac silhouette and mediastinal contours are within normal limits. Calcification is again seen within aortic arch. Flattening of the diaphragms and moderate hyperinflation unchanged. The lungs are clear. Prominent epicardial fat pad at the left costophrenic angle is unchanged from multiple prior studies including 12/02/2016. No pleural effusion or pneumothorax. No acute skeletal abnormality. IMPRESSION: No active disease. Electronically Signed   By: Yvonne Kendall M.D.   On: 01/27/2022 08:22   DG Chest Portable 1 View  Result  Date: 01/26/2022 CLINICAL DATA:  Cough.  Altered mental status.  Sepsis. EXAM: PORTABLE CHEST 1 VIEW COMPARISON:  06/08/2020 FINDINGS: Heart size is stable, accentuated by technique. There is atherosclerotic calcification of the thoracic aorta. Persistent opacity at the LEFT costophrenic angle is similar to multiple prior studies. There is minimal subsegmental atelectasis in the RIGHT lung base. There is perihilar peribronchial thickening, stable. IMPRESSION: Stable appearance of LEFT costophrenic angle blunting. Subsegmental atelectasis in the RIGHT lung base. Electronically Signed   By: Nolon Nations M.D.   On: 01/26/2022 14:06   ECHOCARDIOGRAM COMPLETE  Result Date: 01/27/2022    ECHOCARDIOGRAM REPORT   Patient Name:   Virginia [name] Date of Exam: 01/27/2022 Medical Rec #:  [id number]  Height:       62.0 in Accession #:    [id number]    Weight:       117.1 lb Date of Birth:  06-07-1936     BSA:          1.523 m Patient Age:    48 years      BP:           159/88 mmHg Patient Gender: F             HR:           108 bpm. Exam Location:  Inpatient Procedure: 2D Echo, Cardiac Doppler and Color Doppler Indications:    CHF  History:        Patient has no prior history of Echocardiogram examinations.                 CAD, COPD, Signs/Symptoms:Dyspnea; Risk Factors:Hypertension.  Sonographer:    Jyl Heinz Referring Phys: [id number] [place]  1. Left ventricular ejection fraction, by estimation, is 70 to 75%. The left ventricle has hyperdynamic function. The left ventricle has no regional wall motion abnormalities. There is mild asymmetric left ventricular hypertrophy. Left ventricular diastolic parameters are consistent with Grade I diastolic dysfunction (impaired relaxation).  2. Right ventricular systolic function is normal. The right ventricular size is normal.  3. A small pericardial effusion is present. The pericardial effusion is circumferential.  4. The mitral valve is normal in structure.  Trivial mitral valve regurgitation.  5. The aortic valve is tricuspid. There is mild calcification of the aortic valve. There is mild thickening of the aortic valve. Aortic valve regurgitation is trivial. Aortic valve sclerosis/calcification is present, without any evidence of aortic stenosis.  6. The inferior vena cava is normal in size with greater than 50% respiratory variability, suggesting right atrial pressure of 3 mmHg. Comparison(s): No prior Echocardiogram. FINDINGS  Left Ventricle: Left ventricular ejection fraction, by estimation, is 70 to 75%. The left ventricle has hyperdynamic function. The left ventricle has no regional wall motion abnormalities. The left ventricular internal cavity size was normal in size. There is mild asymmetric left ventricular hypertrophy. Left ventricular diastolic parameters are consistent with Grade I diastolic dysfunction (impaired relaxation). Right Ventricle: The right ventricular size is normal. No increase in right ventricular wall thickness. Right ventricular systolic function is normal. Left Atrium: Left atrial size was normal in size. Right Atrium: Right atrial size was normal in size. Pericardium: A small pericardial effusion is present. The pericardial effusion is circumferential. Presence of epicardial fat layer. Mitral Valve: The mitral valve is normal in structure. Trivial mitral valve regurgitation. Tricuspid Valve: The tricuspid valve is normal in structure. Tricuspid valve regurgitation is trivial. Aortic Valve: The aortic valve is tricuspid. There is mild calcification of the aortic valve. There is mild thickening of the aortic valve. Aortic valve regurgitation is trivial. Aortic valve sclerosis/calcification is present, without any evidence of aortic stenosis. Aortic valve peak gradient measures 6.2 mmHg. Pulmonic Valve: The pulmonic valve was normal in structure. Pulmonic valve regurgitation is trivial. Aorta: The aortic root and ascending aorta are  structurally normal, with no evidence of dilitation. Venous: The inferior vena cava is normal in size with greater than 50% respiratory variability, suggesting right atrial pressure of 3 mmHg. IAS/Shunts: The atrial septum is grossly normal.  LEFT VENTRICLE PLAX 2D LVIDd:         3.70 cm     Diastology LVIDs:  2.00 cm     LV e' medial:    5.70 cm/s LV PW:         0.70 cm     LV E/e' medial:  13.5 LV IVS:        1.10 cm     LV e' lateral:   4.38 cm/s LVOT diam:     2.20 cm     LV E/e' lateral: 17.6 LV SV:         75 LV SV Index:   49 LVOT Area:     3.80 cm  LV Volumes (MOD) LV vol d, MOD A2C: 59.4 ml LV vol d, MOD A4C: 62.2 ml LV vol s, MOD A2C: 23.7 ml LV vol s, MOD A4C: 26.1 ml LV SV MOD A2C:     35.7 ml LV SV MOD A4C:     62.2 ml LV SV MOD BP:      36.1 ml RIGHT VENTRICLE             IVC RV Basal diam:  2.70 cm     IVC diam: 1.50 cm RV Mid diam:    1.90 cm RV S prime:     10.80 cm/s TAPSE (M-mode): 2.2 cm LEFT ATRIUM           Index        RIGHT ATRIUM           Index LA diam:      2.60 cm 1.71 cm/m   RA Area:     10.80 cm LA Vol (A2C): 22.1 ml 14.52 ml/m  RA Volume:   20.10 ml  13.20 ml/m LA Vol (A4C): 37.7 ml 24.76 ml/m  AORTIC VALVE AV Area (Vmax): 2.76 cm AV Vmax:        125.00 cm/s AV Peak Grad:   6.2 mmHg LVOT Vmax:      90.60 cm/s LVOT Vmean:     67.300 cm/s LVOT VTI:       0.198 m  AORTA Ao Root diam: 2.70 cm Ao Asc diam:  2.70 cm MITRAL VALVE MV Area (PHT): 3.26 cm    SHUNTS MV Decel Time: 233 msec    Systemic VTI:  0.20 m MV E velocity: 77.10 cm/s  Systemic Diam: 2.20 cm MV A velocity: 89.70 cm/s MV E/A ratio:  0.86 Gwyndolyn Kaufman MD Electronically signed by Gwyndolyn Kaufman MD Signature Date/Time: 01/27/2022/2:38:18 PM    Final       Subjective: Open eyes, mumbles, once a while yells a word that's comprehendable.  Discharge Exam: Vitals:   01/30/22 1100 01/30/22 1120  BP: (!) 148/75   Pulse: (!) 112   Resp: 12 17  Temp:  98.5 F (36.9 C)  SpO2: 97%    Vitals:    01/30/22 0754 01/30/22 0802 01/30/22 1100 01/30/22 1120  BP:  (!) 163/115 (!) 148/75   Pulse:  92 (!) 112   Resp:  17 12 17   Temp:    98.5 F (36.9 C)  TempSrc:    Oral  SpO2: 98% 98% 97%   Weight:      Height:        General: Pt is awake, not in acute distress Cardiovascular: RRR, S1/S2 +, no rubs, no gallops Respiratory: CTA bilaterally, no wheezing Abdominal: Soft, NT, ND, bowel sounds + Extremities: no edema    The results of significant diagnostics from this hospitalization (including imaging, microbiology, ancillary and laboratory) are listed below for reference.     Microbiology: Recent Results (  from the past 240 hour(s))  Culture, blood (Routine x 2)     Status: Abnormal   Collection Time: 01/26/22  1:08 PM   Specimen: BLOOD  Result Value Ref Range Status   Specimen Description BLOOD SITE NOT SPECIFIED  Final   Special Requests   Final    BOTTLES DRAWN AEROBIC AND ANAEROBIC Blood Culture adequate volume   Culture  Setup Time   Final    GRAM POSITIVE COCCI IN CLUSTERS ANAEROBIC BOTTLE ONLY CRITICAL RESULT CALLED TO, READ BACK BY AND VERIFIED WITH: PHARMD E.MARTIN AT 0258 ON 01/27/2022 BY T.SAAD.    Culture (A)  Final    STAPHYLOCOCCUS EPIDERMIDIS THE SIGNIFICANCE OF ISOLATING THIS ORGANISM FROM A SINGLE SET OF BLOOD CULTURES WHEN MULTIPLE SETS ARE DRAWN IS UNCERTAIN. PLEASE NOTIFY THE MICROBIOLOGY DEPARTMENT WITHIN ONE WEEK IF SPECIATION AND SENSITIVITIES ARE REQUIRED. Performed at [facility], [place] [address] [place], [place] [address]    Report Status 01/29/2022 FINAL  Final  Blood Culture ID Panel (Reflexed)     Status: Abnormal   Collection Time: 01/26/22  1:08 PM  Result Value Ref Range Status   Enterococcus faecalis NOT DETECTED NOT DETECTED Final   Enterococcus Faecium NOT DETECTED NOT DETECTED Final   Listeria monocytogenes NOT DETECTED NOT DETECTED Final   Staphylococcus species DETECTED (A) NOT DETECTED Corrected    Comment: CRITICAL RESULT  CALLED TO, READ BACK BY AND VERIFIED WITH: PHARMD E.MARTIN AT 1248 ON 01/27/2022 BY T.SAAD. CORRECTED ON 03/01 AT 0950: PREVIOUSLY REPORTED AS DETECTED RBV PHARMD E.MARTIN AT 2423 ON 01/27/2022 BY T.SAAD.    Staphylococcus aureus (BCID) NOT DETECTED NOT DETECTED Final   Staphylococcus epidermidis DETECTED (A) NOT DETECTED Final    Comment: CRITICAL RESULT CALLED TO, READ BACK BY AND VERIFIED WITH: PHARMD E.MARTIN AT 1248 ON 01/27/2022 BY T.SAAD.    Staphylococcus lugdunensis NOT DETECTED NOT DETECTED Final   Streptococcus species NOT DETECTED NOT DETECTED Final   Streptococcus agalactiae NOT DETECTED NOT DETECTED Final   Streptococcus pneumoniae NOT DETECTED NOT DETECTED Final   Streptococcus pyogenes NOT DETECTED NOT DETECTED Final   A.calcoaceticus-baumannii NOT DETECTED NOT DETECTED Final   Bacteroides fragilis NOT DETECTED NOT DETECTED Final   Enterobacterales NOT DETECTED NOT DETECTED Final   Enterobacter cloacae complex NOT DETECTED NOT DETECTED Final   Escherichia coli NOT DETECTED NOT DETECTED Final   Klebsiella aerogenes NOT DETECTED NOT DETECTED Final   Klebsiella oxytoca NOT DETECTED NOT DETECTED Final   Klebsiella pneumoniae NOT DETECTED NOT DETECTED Final   Proteus species NOT DETECTED NOT DETECTED Final   Salmonella species NOT DETECTED NOT DETECTED Final   Serratia marcescens NOT DETECTED NOT DETECTED Final   Haemophilus influenzae NOT DETECTED NOT DETECTED Final   Neisseria meningitidis NOT DETECTED NOT DETECTED Final   Pseudomonas aeruginosa NOT DETECTED NOT DETECTED Final   Stenotrophomonas maltophilia NOT DETECTED NOT DETECTED Final   Candida albicans NOT DETECTED NOT DETECTED Final   Candida auris NOT DETECTED NOT DETECTED Final   Candida glabrata NOT DETECTED NOT DETECTED Final   Candida krusei NOT DETECTED NOT DETECTED Final   Candida parapsilosis NOT DETECTED NOT DETECTED Final   Candida tropicalis NOT DETECTED NOT DETECTED Final   Cryptococcus  neoformans/gattii NOT DETECTED NOT DETECTED Final   Methicillin resistance mecA/C NOT DETECTED NOT DETECTED Final    Comment: Performed at [facility], [address], [place],  [address]  Culture, blood (Routine x 2)     Status: None (Preliminary result)  Collection Time: 01/26/22  1:13 PM   Specimen: BLOOD RIGHT FOREARM  Result Value Ref Range Status   Specimen Description BLOOD RIGHT FOREARM  Final   Special Requests   Final    BOTTLES DRAWN AEROBIC AND ANAEROBIC Blood Culture results may not be optimal due to an inadequate volume of blood received in culture bottles   Culture   Final    NO GROWTH 4 DAYS Performed at [facility], [place] [address] [place], [place] [address]    Report Status PENDING  Incomplete  Resp Panel by RT-PCR (Flu A&B, Covid) Nasopharyngeal Swab     Status: None   Collection Time: 01/26/22  1:14 PM   Specimen: Nasopharyngeal Swab; Nasopharyngeal(NP) swabs in vial transport medium  Result Value Ref Range Status   SARS Coronavirus 2 by RT PCR NEGATIVE NEGATIVE Final    Comment: (NOTE) SARS-CoV-2 target nucleic acids are NOT DETECTED.  The SARS-CoV-2 RNA is generally detectable in upper respiratory specimens during the acute phase of infection. The lowest concentration of SARS-CoV-2 viral copies this assay can detect is 138 copies/mL. A negative result does not preclude SARS-Cov-2 infection and should not be used as the sole basis for treatment or other patient management decisions. A negative result may occur with  improper specimen collection/handling, submission of specimen other than nasopharyngeal swab, presence of viral mutation(s) within the areas targeted by this assay, and inadequate number of viral copies(<138 copies/mL). A negative result must be combined with clinical observations, patient history, and epidemiological information. The expected result is Negative.  Fact Sheet for Patients:   EntrepreneurPulse.com.au  Fact Sheet for Healthcare Providers:  IncredibleEmployment.be  This test is no t yet approved or cleared by the [place] FDA and  has been authorized for detection and/or diagnosis of SARS-CoV-2 by FDA under an Emergency Use Authorization (EUA). This EUA will remain  in effect (meaning this test can be used) for the duration of the COVID-19 declaration under Section 564(b)(1) of the Act, 21 U.S.C.section 360bbb-3(b)(1), unless the authorization is terminated  or revoked sooner.       Influenza A by PCR NEGATIVE NEGATIVE Final   Influenza B by PCR NEGATIVE NEGATIVE Final    Comment: (NOTE) The Xpert Xpress SARS-CoV-2/FLU/RSV plus assay is intended as an aid in the diagnosis of influenza from Nasopharyngeal swab specimens and should not be used as a sole basis for treatment. Nasal washings and aspirates are unacceptable for Xpert Xpress SARS-CoV-2/FLU/RSV testing.  Fact Sheet for Patients: EntrepreneurPulse.com.au  Fact Sheet for Healthcare Providers: IncredibleEmployment.be  This test is not yet approved or cleared by the [place] FDA and has been authorized for detection and/or diagnosis of SARS-CoV-2 by FDA under an Emergency Use Authorization (EUA). This EUA will remain in effect (meaning this test can be used) for the duration of the COVID-19 declaration under Section 564(b)(1) of the Act, 21 U.S.C. section 360bbb-3(b)(1), unless the authorization is terminated or revoked.  Performed at [facility], [place] [address], [place], [place] [address]      Labs: BNP (last 3 results) Recent Labs    01/26/22 1312  BNP 353.2*   Basic Metabolic Panel: Recent Labs  Lab 01/26/22 1308 01/26/22 1317 01/26/22 2142 01/27/22 0740 01/28/22 0325 01/29/22 0342  NA 150* 152*  --  148* 145 141  K 3.2* 3.1*  --  4.3 3.5 3.3*  CL 109 109  --  110 108 106  CO2 33*  --    --  31 29 26   GLUCOSE 145* 141*  --  151* 139* 93  BUN 21 22  --  26* 31* 22  CREATININE 0.73 0.80  --  0.84 0.84 0.69  CALCIUM 9.1  --   --  8.9 8.6* 8.2*  MG  --   --  3.0*  --   --   --    Liver Function Tests: Recent Labs  Lab 01/26/22 1308  AST 25  ALT 21  ALKPHOS 45  BILITOT 0.6  PROT 6.4*  ALBUMIN 3.1*   No results for input(s): LIPASE, AMYLASE in the last 168 hours. No results for input(s): AMMONIA in the last 168 hours. CBC: Recent Labs  Lab 01/26/22 1317 01/26/22 2142 01/27/22 0740 01/28/22 0325 01/29/22 0342  WBC  --  7.5 6.3 10.2 8.4  NEUTROABS  --  5.3  --   --   --   HGB 13.6 13.8 13.5 12.7 11.8*  HCT 40.0 43.5 42.8 39.7 37.8  MCV  --  96.2 95.5 95.9 95.5  PLT  --  207 211 180 142*   Cardiac Enzymes: No results for input(s): CKTOTAL, CKMB, CKMBINDEX, TROPONINI in the last 168 hours. BNP: Invalid input(s): POCBNP CBG: Recent Labs  Lab 01/26/22 1308 01/28/22 1142 01/28/22 1617  GLUCAP 148* 125* 127*   D-Dimer No results for input(s): DDIMER in the last 72 hours. Hgb A1c No results for input(s): HGBA1C in the last 72 hours. Lipid Profile No results for input(s): CHOL, HDL, LDLCALC, TRIG, CHOLHDL, LDLDIRECT in the last 72 hours. Thyroid function studies No results for input(s): TSH, T4TOTAL, T3FREE, THYROIDAB in the last 72 hours.  Invalid input(s): FREET3 Anemia work up No results for input(s): VITAMINB12, FOLATE, FERRITIN, TIBC, IRON, RETICCTPCT in the last 72 hours. Urinalysis    Component Value Date/Time   COLORURINE YELLOW 01/26/2022 1308   APPEARANCEUR CLEAR 01/26/2022 1308   LABSPEC 1.026 01/26/2022 1308   PHURINE 5.0 01/26/2022 1308   GLUCOSEU NEGATIVE 01/26/2022 1308   HGBUR NEGATIVE 01/26/2022 1308   False Pass 01/26/2022 1308   BILIRUBINUR neg 12/29/2017 1439   KETONESUR NEGATIVE 01/26/2022 1308   PROTEINUR >=300 (A) 01/26/2022 1308   UROBILINOGEN 0.2 06/14/2018 1537   NITRITE NEGATIVE 01/26/2022 1308    LEUKOCYTESUR NEGATIVE 01/26/2022 1308   Sepsis Labs Invalid input(s): PROCALCITONIN,  WBC,  LACTICIDVEN Microbiology Recent Results (from the past 240 hour(s))  Culture, blood (Routine x 2)     Status: Abnormal   Collection Time: 01/26/22  1:08 PM   Specimen: BLOOD  Result Value Ref Range Status   Specimen Description BLOOD SITE NOT SPECIFIED  Final   Special Requests   Final    BOTTLES DRAWN AEROBIC AND ANAEROBIC Blood Culture adequate volume   Culture  Setup Time   Final    GRAM POSITIVE COCCI IN CLUSTERS ANAEROBIC BOTTLE ONLY CRITICAL RESULT CALLED TO, READ BACK BY AND VERIFIED WITH: PHARMD E.MARTIN AT 3716 ON 01/27/2022 BY T.SAAD.    Culture (A)  Final    STAPHYLOCOCCUS EPIDERMIDIS THE SIGNIFICANCE OF ISOLATING THIS ORGANISM FROM A SINGLE SET OF BLOOD CULTURES WHEN MULTIPLE SETS ARE DRAWN IS UNCERTAIN. PLEASE NOTIFY THE MICROBIOLOGY DEPARTMENT WITHIN ONE WEEK IF SPECIATION AND SENSITIVITIES ARE REQUIRED. Performed at [facility], [place] [address], [place], [place] [address]    Report Status 01/29/2022 FINAL  Final  Blood Culture ID Panel (Reflexed)     Status: Abnormal   Collection Time: 01/26/22  1:08 PM  Result Value Ref Range Status  Enterococcus faecalis NOT DETECTED NOT DETECTED Final   Enterococcus Faecium NOT DETECTED NOT DETECTED Final   Listeria monocytogenes NOT DETECTED NOT DETECTED Final   Staphylococcus species DETECTED (A) NOT DETECTED Corrected    Comment: CRITICAL RESULT CALLED TO, READ BACK BY AND VERIFIED WITH: PHARMD E.MARTIN AT 1248 ON 01/27/2022 BY T.SAAD. CORRECTED ON 03/01 AT 0950: PREVIOUSLY REPORTED AS DETECTED RBV PHARMD E.MARTIN AT 7517 ON 01/27/2022 BY T.SAAD.    Staphylococcus aureus (BCID) NOT DETECTED NOT DETECTED Final   Staphylococcus epidermidis DETECTED (A) NOT DETECTED Final    Comment: CRITICAL RESULT CALLED TO, READ BACK BY AND VERIFIED WITH: PHARMD E.MARTIN AT 1248 ON 01/27/2022 BY T.SAAD.    Staphylococcus lugdunensis NOT  DETECTED NOT DETECTED Final   Streptococcus species NOT DETECTED NOT DETECTED Final   Streptococcus agalactiae NOT DETECTED NOT DETECTED Final   Streptococcus pneumoniae NOT DETECTED NOT DETECTED Final   Streptococcus pyogenes NOT DETECTED NOT DETECTED Final   A.calcoaceticus-baumannii NOT DETECTED NOT DETECTED Final   Bacteroides fragilis NOT DETECTED NOT DETECTED Final   Enterobacterales NOT DETECTED NOT DETECTED Final   Enterobacter cloacae complex NOT DETECTED NOT DETECTED Final   Escherichia coli NOT DETECTED NOT DETECTED Final   Klebsiella aerogenes NOT DETECTED NOT DETECTED Final   Klebsiella oxytoca NOT DETECTED NOT DETECTED Final   Klebsiella pneumoniae NOT DETECTED NOT DETECTED Final   Proteus species NOT DETECTED NOT DETECTED Final   Salmonella species NOT DETECTED NOT DETECTED Final   Serratia marcescens NOT DETECTED NOT DETECTED Final   Haemophilus influenzae NOT DETECTED NOT DETECTED Final   Neisseria meningitidis NOT DETECTED NOT DETECTED Final   Pseudomonas aeruginosa NOT DETECTED NOT DETECTED Final   Stenotrophomonas maltophilia NOT DETECTED NOT DETECTED Final   Candida albicans NOT DETECTED NOT DETECTED Final   Candida auris NOT DETECTED NOT DETECTED Final   Candida glabrata NOT DETECTED NOT DETECTED Final   Candida krusei NOT DETECTED NOT DETECTED Final   Candida parapsilosis NOT DETECTED NOT DETECTED Final   Candida tropicalis NOT DETECTED NOT DETECTED Final   Cryptococcus neoformans/gattii NOT DETECTED NOT DETECTED Final   Methicillin resistance mecA/C NOT DETECTED NOT DETECTED Final    Comment: Performed at [facility], [address] [place], [place] [address]  Culture, blood (Routine x 2)     Status: None (Preliminary result)   Collection Time: 01/26/22  1:13 PM   Specimen: BLOOD RIGHT FOREARM  Result Value Ref Range Status   Specimen Description BLOOD RIGHT FOREARM  Final   Special Requests   Final    BOTTLES DRAWN AEROBIC AND ANAEROBIC Blood  Culture results may not be optimal due to an inadequate volume of blood received in culture bottles   Culture   Final    NO GROWTH 4 DAYS Performed at [facility], [place] [address] [place], [place] [address]    Report Status PENDING  Incomplete  Resp Panel by RT-PCR (Flu A&B, Covid) Nasopharyngeal Swab     Status: None   Collection Time: 01/26/22  1:14 PM   Specimen: Nasopharyngeal Swab; Nasopharyngeal(NP) swabs in vial transport medium  Result Value Ref Range Status   SARS Coronavirus 2 by RT PCR NEGATIVE NEGATIVE Final    Comment: (NOTE) SARS-CoV-2 target nucleic acids are NOT DETECTED.  The SARS-CoV-2 RNA is generally detectable in upper respiratory specimens during the acute phase of infection. The lowest concentration of SARS-CoV-2 viral copies this assay can detect is 138 copies/mL. A negative result does not preclude SARS-Cov-2  infection and should not be used as the sole basis for treatment or other patient management decisions. A negative result may occur with  improper specimen collection/handling, submission of specimen other than nasopharyngeal swab, presence of viral mutation(s) within the areas targeted by this assay, and inadequate number of viral copies(<138 copies/mL). A negative result must be combined with clinical observations, patient history, and epidemiological information. The expected result is Negative.  Fact Sheet for Patients:  EntrepreneurPulse.com.au  Fact Sheet for Healthcare Providers:  IncredibleEmployment.be  This test is no t yet approved or cleared by the [place] FDA and  has been authorized for detection and/or diagnosis of SARS-CoV-2 by FDA under an Emergency Use Authorization (EUA). This EUA will remain  in effect (meaning this test can be used) for the duration of the COVID-19 declaration under Section 564(b)(1) of the Act, 21 U.S.C.section 360bbb-3(b)(1), unless the authorization is  terminated  or revoked sooner.       Influenza A by PCR NEGATIVE NEGATIVE Final   Influenza B by PCR NEGATIVE NEGATIVE Final    Comment: (NOTE) The Xpert Xpress SARS-CoV-2/FLU/RSV plus assay is intended as an aid in the diagnosis of influenza from Nasopharyngeal swab specimens and should not be used as a sole basis for treatment. Nasal washings and aspirates are unacceptable for Xpert Xpress SARS-CoV-2/FLU/RSV testing.  Fact Sheet for Patients: EntrepreneurPulse.com.au  Fact Sheet for Healthcare Providers: IncredibleEmployment.be  This test is not yet approved or cleared by the [place] FDA and has been authorized for detection and/or diagnosis of SARS-CoV-2 by FDA under an Emergency Use Authorization (EUA). This EUA will remain in effect (meaning this test can be used) for the duration of the COVID-19 declaration under Section 564(b)(1) of the Act, 21 U.S.C. section 360bbb-3(b)(1), unless the authorization is terminated or revoked.  Performed at [facility], [place] [address], [place],  [address]      Time coordinating discharge: Over 30 minutes  SIGNED:   Nolberto Hanlon, MD  [facility] 01/30/2022, 1:45 PM Pager   If 7PM-7AM, please contact night-coverage [email or web address] Password [email or web address]

## 2022-01-30 NOTE — TOC Transition Note (Addendum)
Transition of Care (TOC) - CM/SW Discharge Note ? ? ?Patient Details  ?Name: Virginia [name] ?MRN: [id number] ?Date of Birth: 08-06-1936 ? ?Transition of Care (TOC) CM/SW Contact:  ?Zenon Mayo, RN ?Phone Number: ?01/30/2022, 2:04 PM ? ? ?Clinical Narrative:    ?Patient has son and daughter as POA, and they both will need to be called regarding care of the patient. Patient is for dc today, NCM spoke with son offered choice , he states [facility] is good.  NCM made referral to [facility] with [facility] for [facility], HH Speech,he is able to take referral.  Soc will begin 24 to 48 hrs post dc.   NCM spoke with daughter also and she is ok with [facility], she states she pays for the aide at [facility] as well.  NCM informed Corene Cornea with [facility] that he will need to call both son and daughter when they need something for the patient since they are both POA for patient.    Daughter states she would also like [facility]  for palliative services, NCM also informed the son , [name] of this information he is on board with this.  NCM made referral to [facility] with [place] for palliative services and informed her that she will need to call both son and daughter when they need something. Daughter states patient has injured her right arm so be careful when transferring on to the ambulance. Daughter is ok now with patient going home today via ambulance.  Ptar has been scheduled.   ? ? ?Final next level of care: [place] ?Barriers to Discharge: No Barriers Identified ? ? ?Patient Goals and CMS Choice ?Patient states their goals for this hospitalization and ongoing recovery are:: to return home ?CMS Medicare.gov Compare Post Acute Care list provided to:: Patient Represenative (must comment) ?Choice offered to / list presented to : Adult Children ? ?Discharge Placement ?  ?           ?  ?  ?  ?  ? ?Discharge Plan and Services ?  ?  ?           ?  ?DME Agency: NA ?  ?  ?  ?HH Arranged: Therapist, sports, Speech Therapy ?[place] Agency:  [place] ([place]) ?Date HH Agency Contacted: 01/30/22 ?Time [place]: 4174 ?Representative spoke with at [place]: Corene Cornea ? ?Social Determinants of Health (SDOH) Interventions ?  ? ? ?Readmission Risk Interventions ?No flowsheet data found. ? ? ? ? ?

## 2022-01-31 ENCOUNTER — Telehealth: Payer: Self-pay | Admitting: *Deleted

## 2022-01-31 LAB — CULTURE, BLOOD (ROUTINE X 2): Culture: NO GROWTH

## 2022-01-31 NOTE — Telephone Encounter (Signed)
Virginia [name] with [facility] called and stated that they received orders from the hospital for their Palliative Care Program.  ? ?Needing verbal orders of approval from PCP.  ?Please Advise.  ?

## 2022-01-31 NOTE — Telephone Encounter (Signed)
Transition Care Management Unsuccessful Follow-up Telephone Call ? ?Date of discharge and from where:  01/30/2022 [facility] ? ?Attempts:  1st Attempt ? ?Reason for unsuccessful TCM follow-up call:  Left voice message to return call.  ? ?Patient is scheduled for a [facility] 02/04/2022 @ 11 with Dr. Sabra Heck  ? ?  ?

## 2022-01-31 NOTE — Telephone Encounter (Signed)
Transition Care Management Follow-up Telephone Call ?Date of discharge and from where: 01/30/2022 [place] ?How have you been since you were released from the hospital? Still weak, patient is bedbound ?Any questions or concerns? Yes stated that they are not happy with the medications they were discharged home with. Stated patient was suppose to get a oral antibiotic. I offered [place] appointment for today to discuss and he refused and stated that they will call back.  ? ?Items Reviewed: ?Did the pt receive and understand the discharge instructions provided? Yes  ?Medications obtained and verified? Yes Requested liquid Antibiotic ?Other? No  ?Any new allergies since your discharge? No  ?Dietary orders reviewed? Yes ?Do you have support at home? Yes  ? ?Home Care and Equipment/Supplies: ?Were home health services ordered? yes ?If so, what is the name of the agency? Not sure of name  ?Has the agency set up a time to come to the patient's home? yes ?Were any new equipment or medical supplies ordered?  No ?What is the name of the medical supply agency? na ?Were you able to get the supplies/equipment? not applicable ?Do you have any questions related to the use of the equipment or supplies? No ? ?Functional Questionnaire: (I = Independent and D = Dependent) ?ADLs: D ? ?Bathing/Dressing- D ? ?Meal Prep- D ? ?Eating- I ? ?Maintaining continence- D ? ?Transferring/Ambulation- D ? ?Managing Meds- D ? ?Follow up appointments reviewed: ? ?PCP Hospital f/u appt confirmed? No  Stated that they will call back to schedule something.  ?[facility] f/u appt confirmed? No   ?Are transportation arrangements needed? No  ?If their condition worsens, is the pt aware to call PCP or go to the Emergency Dept.? Yes ?Was the patient provided with contact information for the PCP's office or ED? Yes ?Was to pt encouraged to call back with questions or concerns? Yes  ?

## 2022-01-31 NOTE — Telephone Encounter (Signed)
Okay to approve.

## 2022-02-03 NOTE — Telephone Encounter (Signed)
Manus Gunning Notified and agreed.  ?

## 2022-02-04 ENCOUNTER — Ambulatory Visit: Payer: No Typology Code available for payment source | Admitting: Family Medicine

## 2022-02-28 DIAGNOSIS — R1310 Dysphagia, unspecified: Secondary | ICD-10-CM | POA: Diagnosis not present

## 2022-02-28 DIAGNOSIS — J449 Chronic obstructive pulmonary disease, unspecified: Secondary | ICD-10-CM | POA: Diagnosis not present

## 2022-03-07 DIAGNOSIS — F0390 Unspecified dementia without behavioral disturbance: Secondary | ICD-10-CM | POA: Diagnosis not present

## 2022-03-07 DIAGNOSIS — K219 Gastro-esophageal reflux disease without esophagitis: Secondary | ICD-10-CM | POA: Diagnosis not present

## 2022-03-07 DIAGNOSIS — J9611 Chronic respiratory failure with hypoxia: Secondary | ICD-10-CM | POA: Diagnosis not present

## 2022-03-07 DIAGNOSIS — Z7401 Bed confinement status: Secondary | ICD-10-CM | POA: Diagnosis not present

## 2022-03-17 DIAGNOSIS — Z20822 Contact with and (suspected) exposure to covid-19: Secondary | ICD-10-CM | POA: Diagnosis not present

## 2022-03-20 DIAGNOSIS — Z20822 Contact with and (suspected) exposure to covid-19: Secondary | ICD-10-CM | POA: Diagnosis not present

## 2022-03-29 DIAGNOSIS — J449 Chronic obstructive pulmonary disease, unspecified: Secondary | ICD-10-CM | POA: Diagnosis not present

## 2022-03-29 DIAGNOSIS — R059 Cough, unspecified: Secondary | ICD-10-CM | POA: Diagnosis not present

## 2022-03-31 DIAGNOSIS — R059 Cough, unspecified: Secondary | ICD-10-CM | POA: Diagnosis not present

## 2022-04-07 DIAGNOSIS — Z20822 Contact with and (suspected) exposure to covid-19: Secondary | ICD-10-CM | POA: Diagnosis not present

## 2022-04-09 DIAGNOSIS — Z20822 Contact with and (suspected) exposure to covid-19: Secondary | ICD-10-CM | POA: Diagnosis not present

## 2022-04-11 DIAGNOSIS — J302 Other seasonal allergic rhinitis: Secondary | ICD-10-CM | POA: Diagnosis not present

## 2022-04-11 DIAGNOSIS — J9611 Chronic respiratory failure with hypoxia: Secondary | ICD-10-CM | POA: Diagnosis not present

## 2022-04-11 DIAGNOSIS — R238 Other skin changes: Secondary | ICD-10-CM | POA: Diagnosis not present

## 2022-04-11 DIAGNOSIS — F0390 Unspecified dementia without behavioral disturbance: Secondary | ICD-10-CM | POA: Diagnosis not present

## 2022-04-30 DIAGNOSIS — Z9981 Dependence on supplemental oxygen: Secondary | ICD-10-CM | POA: Diagnosis not present

## 2022-04-30 DIAGNOSIS — J449 Chronic obstructive pulmonary disease, unspecified: Secondary | ICD-10-CM | POA: Diagnosis not present

## 2022-04-30 DIAGNOSIS — F444 Conversion disorder with motor symptom or deficit: Secondary | ICD-10-CM | POA: Diagnosis not present

## 2022-04-30 DIAGNOSIS — R1310 Dysphagia, unspecified: Secondary | ICD-10-CM | POA: Diagnosis not present

## 2022-04-30 DIAGNOSIS — F0390 Unspecified dementia without behavioral disturbance: Secondary | ICD-10-CM | POA: Diagnosis not present

## 2022-05-16 DIAGNOSIS — F0390 Unspecified dementia without behavioral disturbance: Secondary | ICD-10-CM | POA: Diagnosis not present

## 2022-05-16 DIAGNOSIS — I509 Heart failure, unspecified: Secondary | ICD-10-CM | POA: Diagnosis not present

## 2022-05-16 DIAGNOSIS — J9611 Chronic respiratory failure with hypoxia: Secondary | ICD-10-CM | POA: Diagnosis not present

## 2022-05-16 DIAGNOSIS — F419 Anxiety disorder, unspecified: Secondary | ICD-10-CM | POA: Diagnosis not present

## 2022-05-30 DIAGNOSIS — Z9981 Dependence on supplemental oxygen: Secondary | ICD-10-CM | POA: Diagnosis not present

## 2022-05-30 DIAGNOSIS — J449 Chronic obstructive pulmonary disease, unspecified: Secondary | ICD-10-CM | POA: Diagnosis not present

## 2022-05-31 DIAGNOSIS — R059 Cough, unspecified: Secondary | ICD-10-CM | POA: Diagnosis not present

## 2022-06-16 ENCOUNTER — Other Ambulatory Visit: Payer: Self-pay | Admitting: Physician Assistant

## 2022-06-16 DIAGNOSIS — E0590 Thyrotoxicosis, unspecified without thyrotoxic crisis or storm: Secondary | ICD-10-CM

## 2022-06-16 DIAGNOSIS — R251 Tremor, unspecified: Secondary | ICD-10-CM

## 2022-06-16 DIAGNOSIS — I10 Essential (primary) hypertension: Secondary | ICD-10-CM

## 2022-06-21 DIAGNOSIS — J449 Chronic obstructive pulmonary disease, unspecified: Secondary | ICD-10-CM | POA: Diagnosis not present

## 2022-06-21 DIAGNOSIS — J302 Other seasonal allergic rhinitis: Secondary | ICD-10-CM | POA: Diagnosis not present

## 2022-06-21 DIAGNOSIS — F419 Anxiety disorder, unspecified: Secondary | ICD-10-CM | POA: Diagnosis not present

## 2022-06-21 DIAGNOSIS — R479 Unspecified speech disturbances: Secondary | ICD-10-CM | POA: Diagnosis not present

## 2022-06-21 DIAGNOSIS — I509 Heart failure, unspecified: Secondary | ICD-10-CM | POA: Diagnosis not present

## 2022-06-30 DIAGNOSIS — Z9981 Dependence on supplemental oxygen: Secondary | ICD-10-CM | POA: Diagnosis not present

## 2022-06-30 DIAGNOSIS — J449 Chronic obstructive pulmonary disease, unspecified: Secondary | ICD-10-CM | POA: Diagnosis not present

## 2022-07-08 ENCOUNTER — Other Ambulatory Visit: Payer: Self-pay | Admitting: Physician Assistant

## 2022-07-08 DIAGNOSIS — I10 Essential (primary) hypertension: Secondary | ICD-10-CM

## 2022-07-08 DIAGNOSIS — R251 Tremor, unspecified: Secondary | ICD-10-CM

## 2022-07-08 DIAGNOSIS — E0590 Thyrotoxicosis, unspecified without thyrotoxic crisis or storm: Secondary | ICD-10-CM

## 2022-07-11 DIAGNOSIS — G309 Alzheimer's disease, unspecified: Secondary | ICD-10-CM | POA: Diagnosis not present

## 2022-07-11 DIAGNOSIS — E785 Hyperlipidemia, unspecified: Secondary | ICD-10-CM | POA: Diagnosis not present

## 2022-07-11 DIAGNOSIS — R32 Unspecified urinary incontinence: Secondary | ICD-10-CM | POA: Diagnosis not present

## 2022-07-11 DIAGNOSIS — Z7982 Long term (current) use of aspirin: Secondary | ICD-10-CM | POA: Diagnosis not present

## 2022-07-11 DIAGNOSIS — J302 Other seasonal allergic rhinitis: Secondary | ICD-10-CM | POA: Diagnosis not present

## 2022-07-11 DIAGNOSIS — K219 Gastro-esophageal reflux disease without esophagitis: Secondary | ICD-10-CM | POA: Diagnosis not present

## 2022-07-11 DIAGNOSIS — J449 Chronic obstructive pulmonary disease, unspecified: Secondary | ICD-10-CM | POA: Diagnosis not present

## 2022-07-11 DIAGNOSIS — I130 Hypertensive heart and chronic kidney disease with heart failure and stage 1 through stage 4 chronic kidney disease, or unspecified chronic kidney disease: Secondary | ICD-10-CM | POA: Diagnosis not present

## 2022-07-11 DIAGNOSIS — J9611 Chronic respiratory failure with hypoxia: Secondary | ICD-10-CM | POA: Diagnosis not present

## 2022-07-11 DIAGNOSIS — N183 Chronic kidney disease, stage 3 unspecified: Secondary | ICD-10-CM | POA: Diagnosis not present

## 2022-07-11 DIAGNOSIS — Z87891 Personal history of nicotine dependence: Secondary | ICD-10-CM | POA: Diagnosis not present

## 2022-07-11 DIAGNOSIS — Z7951 Long term (current) use of inhaled steroids: Secondary | ICD-10-CM | POA: Diagnosis not present

## 2022-07-11 DIAGNOSIS — I509 Heart failure, unspecified: Secondary | ICD-10-CM | POA: Diagnosis not present

## 2022-07-11 DIAGNOSIS — Z7401 Bed confinement status: Secondary | ICD-10-CM | POA: Diagnosis not present

## 2022-07-11 DIAGNOSIS — F02811 Dementia in other diseases classified elsewhere, unspecified severity, with agitation: Secondary | ICD-10-CM | POA: Diagnosis not present

## 2022-07-11 DIAGNOSIS — Z9981 Dependence on supplemental oxygen: Secondary | ICD-10-CM | POA: Diagnosis not present

## 2022-07-11 DIAGNOSIS — F0284 Dementia in other diseases classified elsewhere, unspecified severity, with anxiety: Secondary | ICD-10-CM | POA: Diagnosis not present

## 2022-07-11 DIAGNOSIS — E559 Vitamin D deficiency, unspecified: Secondary | ICD-10-CM | POA: Diagnosis not present

## 2022-07-17 ENCOUNTER — Ambulatory Visit (INDEPENDENT_AMBULATORY_CARE_PROVIDER_SITE_OTHER): Payer: Medicare Other | Admitting: Allergy & Immunology

## 2022-07-17 ENCOUNTER — Encounter: Payer: Self-pay | Admitting: Allergy & Immunology

## 2022-07-17 ENCOUNTER — Other Ambulatory Visit: Payer: Self-pay | Admitting: Allergy & Immunology

## 2022-07-17 DIAGNOSIS — J310 Chronic rhinitis: Secondary | ICD-10-CM | POA: Diagnosis not present

## 2022-07-17 DIAGNOSIS — J449 Chronic obstructive pulmonary disease, unspecified: Secondary | ICD-10-CM | POA: Diagnosis not present

## 2022-07-17 MED ORDER — FORMOTEROL FUMARATE 20 MCG/2ML IN NEBU: 20.0000 ug | INHALATION_SOLUTION | Freq: Two times a day (BID) | RESPIRATORY_TRACT | 2 refills | Status: DC

## 2022-07-17 MED ORDER — PREDNISONE 10 MG PO TABS
ORAL_TABLET | ORAL | 0 refills | Status: DC
Start: 2022-07-17 — End: 2022-07-22

## 2022-07-17 NOTE — Patient Instructions (Addendum)
1. Chronic obstructive pulmonary disease  - We are going to add on Perforomist  - Daily controller medication(s): continue with budesonide (Pulmicort) twice a day  - Rescue medications: albuterol 4 puffs every 4-6 hours as needed, albuterol nebulizer one vial puffs every 4-6 hours as needed or DuoNeb nebulizer one vial every 4-6 hours as needed - Asthma control goals:  * Full participation in all desired activities (may need albuterol before activity) * Albuterol use two time or less a week on average (not counting use with activity) * Cough interfering with sleep two time or less a month * Oral steroids no more than once a year * No hospitalizations  2. Non-allergic rhinitis - Stop chlorpheniramine - You may use Flonase (fluticasons) one spray per nostril daily

## 2022-07-17 NOTE — Progress Notes (Signed)
RE: Virginia [name] MRN: [id number] DOB: 1936-06-17 Date of Telemedicine Visit: 07/17/2022  Referring provider: No ref. provider found Primary care provider: No primary care provider on file.  Chief Complaint: Follow-up (Inflammation in the lungs, SOB off and on. Need to do breathing treatments daily.) and Other (Covid in January, another infection after that, in the hospital in March with upper respiratory problems.)   Telemedicine Follow Up Visit via Telephone: I connected with Virginia [name] for a follow up on 07/21/22 by telephone and verified that I am speaking with the correct person using two identifiers.   I discussed the limitations, risks, security and privacy concerns of performing an evaluation and management service by telephone and the availability of in person appointments. I also discussed with the patient that there may be a patient responsible charge related to this service. The patient expressed understanding and agreed to proceed.  Patient is at home accompanied by her son, Virginia [name], who provided/contributed to the history.  Provider is at the office.  Visit start time: 4:16 PM Visit end time: 4:40 PM Insurance consent/check in by: Angela Nevin Medical consent and medical assistant/nurse: Trayce  History of Present Illness:  Latora is a 86 y.o. female presenting for a follow up visit. She was last seen in April 2022 by Althea Charon. At that time, her Memory Dance was stopped.She was instead continued on budesonide twice daily as well as albuterol as needed.  In the interim, she has been in and out of hospice, but overall, she is actually doing quite well.  There have been some times when Virginia [name], her son, felt that she was going to pass away, but she continues to thrive.  She has had more good days than bad lately.   Asthma/Respiratory Symptom History: Currently she is using her nebulizer more than likely necessary. She does have a lot of chest congestion. Her daughter keeps thinking that she  needs antibiotics. Son thinks that this is more related to inflammation. She occasionally gets prednisone. They are increasing the budeonside to four times daily occasionally.  The prednisone clears it up completely, but Virginia [name] knows that this is not a long-term treatment plan.  Allergic Rhinitis Symptom History: She continues to have a lot of congestion. There is a lot of inflammation in her nose and her lungs.  She has not been doing any nasal saline rinses.  She has not needed antibiotics for any sinus infections.  She was on the [facility] which was helping quite a bit.  She does not really have the rhinorrhea like she did before.  Otherwise, there have been no changes to her past medical history, surgical history, family history, or social history.  Assessment and Plan:  Tirza is a 86 y.o. female with:  Assessment:   Chronic obstructive pulmonary disease   Non-allergic rhinitis   Complicated past medical history including dementia as well as congestive heart failure  Plan/Recommendations:   1. Chronic obstructive pulmonary disease  - We are going to add on Perforomist  - Daily controller medication(s): continue with budesonide (Pulmicort) twice a day  - Rescue medications: albuterol 4 puffs every 4-6 hours as needed, albuterol nebulizer one vial puffs every 4-6 hours as needed or DuoNeb nebulizer one vial every 4-6 hours as needed - Asthma control goals:  * Full participation in all desired activities (may need albuterol before activity) * Albuterol use two time or less a week on average (not counting use with activity) * Cough interfering with sleep two time or  less a month * Oral steroids no more than once a year * No hospitalizations  2. Non-allergic rhinitis - Stop chlorpheniramine - You may use Flonase (fluticasone) one spray per nostril daily   Diagnostics: None.  Medication List:  Current Outpatient Medications  Medication Sig Dispense Refill   acetaminophen  (TYLENOL) 160 MG/5ML liquid Take 320 mg by mouth in the morning and at bedtime.     budesonide (PULMICORT) 0.25 MG/2ML nebulizer solution Take 0.25 mg by nebulization 2 (two) times daily.     Carbinoxamine Maleate 4 MG TABS TAKE 1 TABLET IN MORNING AND 1 AT BEDTIME (Patient taking differently: Take 4 mg by mouth in the morning and at bedtime.) 60 tablet 5   DM-APAP-CPM (CORICIDIN HBP PO) Take 30 mLs by mouth in the morning and at bedtime. Liquid     ipratropium-albuterol (DUONEB) 0.5-2.5 (3) MG/3ML SOLN USE 1 VIAL IN NEBULIZER EVERY 6 HOURS - and as needed (Patient taking differently: Take 3 mLs by nebulization See admin instructions. Every 6 hours scheduled and as needed for wheezing and/or shortness of breath) 3 mL 11   predniSONE (DELTASONE) 10 MG tablet Take two tablets ('20mg'$ ) twice daily for five days, then one tablet ('10mg'$ ) twice daily for five days, then one tablet ('10mg'$ ) daily for five days, then STOP. 35 tablet 0   fluticasone (FLONASE) 50 MCG/ACT nasal spray Use 1 spray each nostril once a day as needed for stuffy nose (Patient not taking: Reported on 01/27/2022) 18.2 mL 2   formoterol (PERFOROMIST) 20 MCG/2ML nebulizer solution TAKE 2 MLS (20 MCG TOTAL) BY NEBULIZATION 2 (TWO) TIMES DAILY. 120 mL 2   guaiFENesin (ROBITUSSIN) 100 MG/5ML liquid Take 400 mg by mouth in the morning and at bedtime. (Patient not taking: Reported on 07/17/2022)     pantoprazole (PROTONIX) 40 MG tablet Take 1 tablet (40 mg total) by mouth daily. 30 tablet 0   propranolol (INDERAL) 20 MG tablet TAKE 1 TABLET BY MOUTH TWICE A DAY 30 tablet 0   No current facility-administered medications for this visit.   Allergies: Allergies  Allergen Reactions   Lidocaine Anaphylaxis, Swelling, Rash and Other (See Comments)    Any of the " Caine  Family "   Other Other (See Comments)    All drugs that end in "cane'-  novacane etc. Daughter states patient almost died when she had it when she was born   I reviewed her past  medical history, social history, family history, and environmental history and no significant changes have been reported from previous visits.  Review of Systems  Constitutional:  Negative for activity change and appetite change.  HENT:  Positive for postnasal drip. Negative for congestion, rhinorrhea, sinus pressure and sore throat.   Eyes:  Negative for pain, discharge, redness and itching.  Respiratory:  Positive for shortness of breath and wheezing. Negative for stridor.   Gastrointestinal:  Negative for diarrhea, nausea and vomiting.  Endocrine: Positive for cold intolerance and heat intolerance.  Musculoskeletal:  Negative for arthralgias, joint swelling and myalgias.  Skin:  Negative for rash.  Allergic/Immunologic: Positive for environmental allergies. Negative for food allergies.    Objective:  Physical exam not obtained as encounter was done via telephone.   Previous notes and tests were reviewed.  I discussed the assessment and treatment plan with the patient. The patient was provided an opportunity to ask questions and all were answered. The patient agreed with the plan and demonstrated an understanding of the instructions.   The patient  was advised to call back or seek an in-person evaluation if the symptoms worsen or if the condition fails to improve as anticipated.  I provided 24 minutes of non-face-to-face time during this encounter.  It was my pleasure to participate in [place] care today. Please feel free to contact me with any questions or concerns.   Sincerely,  Valentina Shaggy, MD

## 2022-07-18 ENCOUNTER — Other Ambulatory Visit: Payer: Self-pay | Admitting: Physician Assistant

## 2022-07-18 DIAGNOSIS — R251 Tremor, unspecified: Secondary | ICD-10-CM

## 2022-07-18 DIAGNOSIS — E0590 Thyrotoxicosis, unspecified without thyrotoxic crisis or storm: Secondary | ICD-10-CM

## 2022-07-18 DIAGNOSIS — F0284 Dementia in other diseases classified elsewhere, unspecified severity, with anxiety: Secondary | ICD-10-CM | POA: Diagnosis not present

## 2022-07-18 DIAGNOSIS — J9611 Chronic respiratory failure with hypoxia: Secondary | ICD-10-CM | POA: Diagnosis not present

## 2022-07-18 DIAGNOSIS — G309 Alzheimer's disease, unspecified: Secondary | ICD-10-CM | POA: Diagnosis not present

## 2022-07-18 DIAGNOSIS — I10 Essential (primary) hypertension: Secondary | ICD-10-CM

## 2022-07-18 DIAGNOSIS — I130 Hypertensive heart and chronic kidney disease with heart failure and stage 1 through stage 4 chronic kidney disease, or unspecified chronic kidney disease: Secondary | ICD-10-CM | POA: Diagnosis not present

## 2022-07-18 DIAGNOSIS — J449 Chronic obstructive pulmonary disease, unspecified: Secondary | ICD-10-CM | POA: Diagnosis not present

## 2022-07-18 DIAGNOSIS — F02811 Dementia in other diseases classified elsewhere, unspecified severity, with agitation: Secondary | ICD-10-CM | POA: Diagnosis not present

## 2022-07-21 ENCOUNTER — Encounter: Payer: Self-pay | Admitting: Allergy & Immunology

## 2022-07-22 DIAGNOSIS — I10 Essential (primary) hypertension: Secondary | ICD-10-CM | POA: Diagnosis not present

## 2022-07-22 DIAGNOSIS — J9611 Chronic respiratory failure with hypoxia: Secondary | ICD-10-CM | POA: Diagnosis not present

## 2022-07-22 DIAGNOSIS — K219 Gastro-esophageal reflux disease without esophagitis: Secondary | ICD-10-CM | POA: Diagnosis not present

## 2022-07-22 DIAGNOSIS — Z7401 Bed confinement status: Secondary | ICD-10-CM | POA: Diagnosis not present

## 2022-07-22 DIAGNOSIS — J302 Other seasonal allergic rhinitis: Secondary | ICD-10-CM | POA: Diagnosis not present

## 2022-07-22 MED ORDER — PREDNISOLONE SODIUM PHOSPHATE 15 MG/5ML PO SOLN: ORAL | 0 refills | Status: DC

## 2022-07-22 NOTE — Addendum Note (Signed)
Addended by: Valentina Shaggy on: 07/22/2022 10:22 AM   Modules accepted: Orders

## 2022-07-22 NOTE — Progress Notes (Signed)
Patient's son called to request the steroids in liquid form. Resent script for prednisolone to pharmacy.  Salvatore Marvel, MD [facility]

## 2022-07-25 DIAGNOSIS — G309 Alzheimer's disease, unspecified: Secondary | ICD-10-CM | POA: Diagnosis not present

## 2022-07-25 DIAGNOSIS — F02811 Dementia in other diseases classified elsewhere, unspecified severity, with agitation: Secondary | ICD-10-CM | POA: Diagnosis not present

## 2022-07-25 DIAGNOSIS — F0284 Dementia in other diseases classified elsewhere, unspecified severity, with anxiety: Secondary | ICD-10-CM | POA: Diagnosis not present

## 2022-07-25 DIAGNOSIS — J449 Chronic obstructive pulmonary disease, unspecified: Secondary | ICD-10-CM | POA: Diagnosis not present

## 2022-07-25 DIAGNOSIS — J9611 Chronic respiratory failure with hypoxia: Secondary | ICD-10-CM | POA: Diagnosis not present

## 2022-07-25 DIAGNOSIS — I130 Hypertensive heart and chronic kidney disease with heart failure and stage 1 through stage 4 chronic kidney disease, or unspecified chronic kidney disease: Secondary | ICD-10-CM | POA: Diagnosis not present

## 2022-07-31 DIAGNOSIS — J449 Chronic obstructive pulmonary disease, unspecified: Secondary | ICD-10-CM | POA: Diagnosis not present

## 2022-07-31 DIAGNOSIS — Z9981 Dependence on supplemental oxygen: Secondary | ICD-10-CM | POA: Diagnosis not present

## 2022-08-01 DIAGNOSIS — G309 Alzheimer's disease, unspecified: Secondary | ICD-10-CM | POA: Diagnosis not present

## 2022-08-01 DIAGNOSIS — J9611 Chronic respiratory failure with hypoxia: Secondary | ICD-10-CM | POA: Diagnosis not present

## 2022-08-01 DIAGNOSIS — F02811 Dementia in other diseases classified elsewhere, unspecified severity, with agitation: Secondary | ICD-10-CM | POA: Diagnosis not present

## 2022-08-01 DIAGNOSIS — F0284 Dementia in other diseases classified elsewhere, unspecified severity, with anxiety: Secondary | ICD-10-CM | POA: Diagnosis not present

## 2022-08-01 DIAGNOSIS — I130 Hypertensive heart and chronic kidney disease with heart failure and stage 1 through stage 4 chronic kidney disease, or unspecified chronic kidney disease: Secondary | ICD-10-CM | POA: Diagnosis not present

## 2022-08-01 DIAGNOSIS — J449 Chronic obstructive pulmonary disease, unspecified: Secondary | ICD-10-CM | POA: Diagnosis not present

## 2022-08-08 DIAGNOSIS — F0284 Dementia in other diseases classified elsewhere, unspecified severity, with anxiety: Secondary | ICD-10-CM | POA: Diagnosis not present

## 2022-08-08 DIAGNOSIS — F02811 Dementia in other diseases classified elsewhere, unspecified severity, with agitation: Secondary | ICD-10-CM | POA: Diagnosis not present

## 2022-08-08 DIAGNOSIS — G309 Alzheimer's disease, unspecified: Secondary | ICD-10-CM | POA: Diagnosis not present

## 2022-08-08 DIAGNOSIS — I130 Hypertensive heart and chronic kidney disease with heart failure and stage 1 through stage 4 chronic kidney disease, or unspecified chronic kidney disease: Secondary | ICD-10-CM | POA: Diagnosis not present

## 2022-08-08 DIAGNOSIS — J449 Chronic obstructive pulmonary disease, unspecified: Secondary | ICD-10-CM | POA: Diagnosis not present

## 2022-08-08 DIAGNOSIS — J9611 Chronic respiratory failure with hypoxia: Secondary | ICD-10-CM | POA: Diagnosis not present

## 2022-08-10 DIAGNOSIS — F0284 Dementia in other diseases classified elsewhere, unspecified severity, with anxiety: Secondary | ICD-10-CM | POA: Diagnosis not present

## 2022-08-10 DIAGNOSIS — K219 Gastro-esophageal reflux disease without esophagitis: Secondary | ICD-10-CM | POA: Diagnosis not present

## 2022-08-10 DIAGNOSIS — J9611 Chronic respiratory failure with hypoxia: Secondary | ICD-10-CM | POA: Diagnosis not present

## 2022-08-10 DIAGNOSIS — Z87891 Personal history of nicotine dependence: Secondary | ICD-10-CM | POA: Diagnosis not present

## 2022-08-10 DIAGNOSIS — F02811 Dementia in other diseases classified elsewhere, unspecified severity, with agitation: Secondary | ICD-10-CM | POA: Diagnosis not present

## 2022-08-10 DIAGNOSIS — Z7982 Long term (current) use of aspirin: Secondary | ICD-10-CM | POA: Diagnosis not present

## 2022-08-10 DIAGNOSIS — I509 Heart failure, unspecified: Secondary | ICD-10-CM | POA: Diagnosis not present

## 2022-08-10 DIAGNOSIS — J302 Other seasonal allergic rhinitis: Secondary | ICD-10-CM | POA: Diagnosis not present

## 2022-08-10 DIAGNOSIS — E559 Vitamin D deficiency, unspecified: Secondary | ICD-10-CM | POA: Diagnosis not present

## 2022-08-10 DIAGNOSIS — N183 Chronic kidney disease, stage 3 unspecified: Secondary | ICD-10-CM | POA: Diagnosis not present

## 2022-08-10 DIAGNOSIS — Z7951 Long term (current) use of inhaled steroids: Secondary | ICD-10-CM | POA: Diagnosis not present

## 2022-08-10 DIAGNOSIS — G309 Alzheimer's disease, unspecified: Secondary | ICD-10-CM | POA: Diagnosis not present

## 2022-08-10 DIAGNOSIS — R32 Unspecified urinary incontinence: Secondary | ICD-10-CM | POA: Diagnosis not present

## 2022-08-10 DIAGNOSIS — E785 Hyperlipidemia, unspecified: Secondary | ICD-10-CM | POA: Diagnosis not present

## 2022-08-10 DIAGNOSIS — J449 Chronic obstructive pulmonary disease, unspecified: Secondary | ICD-10-CM | POA: Diagnosis not present

## 2022-08-10 DIAGNOSIS — Z9981 Dependence on supplemental oxygen: Secondary | ICD-10-CM | POA: Diagnosis not present

## 2022-08-10 DIAGNOSIS — Z7401 Bed confinement status: Secondary | ICD-10-CM | POA: Diagnosis not present

## 2022-08-10 DIAGNOSIS — I130 Hypertensive heart and chronic kidney disease with heart failure and stage 1 through stage 4 chronic kidney disease, or unspecified chronic kidney disease: Secondary | ICD-10-CM | POA: Diagnosis not present

## 2022-08-15 DIAGNOSIS — I130 Hypertensive heart and chronic kidney disease with heart failure and stage 1 through stage 4 chronic kidney disease, or unspecified chronic kidney disease: Secondary | ICD-10-CM | POA: Diagnosis not present

## 2022-08-15 DIAGNOSIS — G309 Alzheimer's disease, unspecified: Secondary | ICD-10-CM | POA: Diagnosis not present

## 2022-08-15 DIAGNOSIS — F0284 Dementia in other diseases classified elsewhere, unspecified severity, with anxiety: Secondary | ICD-10-CM | POA: Diagnosis not present

## 2022-08-15 DIAGNOSIS — J9611 Chronic respiratory failure with hypoxia: Secondary | ICD-10-CM | POA: Diagnosis not present

## 2022-08-15 DIAGNOSIS — F02811 Dementia in other diseases classified elsewhere, unspecified severity, with agitation: Secondary | ICD-10-CM | POA: Diagnosis not present

## 2022-08-15 DIAGNOSIS — J449 Chronic obstructive pulmonary disease, unspecified: Secondary | ICD-10-CM | POA: Diagnosis not present

## 2022-08-25 ENCOUNTER — Other Ambulatory Visit: Payer: Self-pay | Admitting: Allergy & Immunology

## 2022-08-25 NOTE — Telephone Encounter (Signed)
PLEASE ADVISE TO REFILL OF PREDNISOLONE

## 2022-08-26 NOTE — Telephone Encounter (Signed)
No refill

## 2022-08-30 DIAGNOSIS — I4819 Other persistent atrial fibrillation: Secondary | ICD-10-CM | POA: Diagnosis not present

## 2022-08-30 DIAGNOSIS — J449 Chronic obstructive pulmonary disease, unspecified: Secondary | ICD-10-CM | POA: Diagnosis not present

## 2022-09-02 ENCOUNTER — Other Ambulatory Visit: Payer: Self-pay | Admitting: Allergy & Immunology

## 2022-09-02 DIAGNOSIS — L89159 Pressure ulcer of sacral region, unspecified stage: Secondary | ICD-10-CM | POA: Diagnosis not present

## 2022-09-02 DIAGNOSIS — Z7401 Bed confinement status: Secondary | ICD-10-CM | POA: Diagnosis not present

## 2022-09-02 DIAGNOSIS — K219 Gastro-esophageal reflux disease without esophagitis: Secondary | ICD-10-CM | POA: Diagnosis not present

## 2022-09-02 DIAGNOSIS — Z7901 Long term (current) use of anticoagulants: Secondary | ICD-10-CM | POA: Diagnosis not present

## 2022-09-02 NOTE — Telephone Encounter (Signed)
Pt request refill for karbinal and ceterizine

## 2022-09-02 NOTE — Telephone Encounter (Signed)
Lm on vm stating sent in carbinoxamine refill and to not take both zyrtec and carbinoxamine

## 2022-09-02 NOTE — Telephone Encounter (Signed)
Carbinoxamine was sent in is pt suppose to be on zyrtec and carbinoxamine?

## 2022-09-30 DIAGNOSIS — J449 Chronic obstructive pulmonary disease, unspecified: Secondary | ICD-10-CM | POA: Diagnosis not present

## 2022-10-14 ENCOUNTER — Other Ambulatory Visit: Payer: Self-pay | Admitting: Allergy & Immunology

## 2022-10-15 ENCOUNTER — Telehealth: Payer: Self-pay | Admitting: Allergy & Immunology

## 2022-10-15 NOTE — Telephone Encounter (Signed)
She is very congested and getting up white stuff when coughing and theres some she isnt getting up and crackling in lungs.  No temp bp is good. Per son she is labored to get it out. Pt is unable to take tablets or capsules. She is bedridden with dementia

## 2022-10-15 NOTE — Telephone Encounter (Signed)
Pt's son states pt is coughing up white phlegm, he is requesting a liquid steroid if possible, pt's son will be at a dr visit within the next hour but stated it's ok to leave detailed message.

## 2022-10-16 ENCOUNTER — Ambulatory Visit (INDEPENDENT_AMBULATORY_CARE_PROVIDER_SITE_OTHER): Payer: Medicare Other | Admitting: Allergy & Immunology

## 2022-10-16 DIAGNOSIS — J310 Chronic rhinitis: Secondary | ICD-10-CM | POA: Diagnosis not present

## 2022-10-16 DIAGNOSIS — J0100 Acute maxillary sinusitis, unspecified: Secondary | ICD-10-CM | POA: Diagnosis not present

## 2022-10-16 DIAGNOSIS — J449 Chronic obstructive pulmonary disease, unspecified: Secondary | ICD-10-CM | POA: Diagnosis not present

## 2022-10-16 MED ORDER — PREDNISOLONE 15 MG/5ML PO SOLN: 30.0000 mg | Freq: Two times a day (BID) | ORAL | 0 refills | Status: DC

## 2022-10-16 NOTE — Progress Notes (Signed)
RE: Virginia [name] MRN: [id number] DOB: 1936/09/03 Date of Telemedicine Visit: 10/16/2022  Referring provider: No ref. provider found Primary care provider: No primary care provider on file.  Chief Complaint: No chief complaint on file.   Telemedicine Follow Up Visit via Telephone: I connected with Virginia [name] for a follow up on 10/16/22 by telephone and verified that I am speaking with the correct person using two identifiers.   I discussed the limitations, risks, security and privacy concerns of performing an evaluation and management service by telephone and the availability of in person appointments. I also discussed with the patient that there may be a patient responsible charge related to this service. The patient expressed understanding and agreed to proceed.  Patient is at home accompanied by her son who provided/contributed to the history.  Provider is at the office.  Visit start time: 3:50 PM Visit end time: 4:15 PM Insurance consent/check in by: Elmyra Ricks Medical consent and medical assistant/nurse: Cree  History of Present Illness:  She is a 86 y.o. female, who is being followed for COPD as well as non-allergic rhinitis, dementia, and heart failure. Her previous allergy office visit was in August 2023 with myself. At that time, we added on Perforomist in addition to her Pulmicort to see if this would help with her breathing. We also stopped the chlorpheniramine and started Flonase one spray per nostril daily.   Since the last visit, she has not done well. She has been having some under the weather symptoms including congestion. She was started on azithromycin yesterday. She did not get the antibiotics until today, so she started today. There is some low grade fever with this as well. She is doing acetaminophen for her fever. She is crushing the azithromycin tablet up to help her to take the medication. She his having some labored breathing, which is why her son thought that she  needed prednisone to clear this up.   Asthma/Respiratory Symptom History: She remains on her nebulized medications for her breathing. She has been more labored as of late. She has not been in the hospital at all since the last visit. She was being seen by hospice at some point, but they have since signed off.   Allergic Rhinitis Symptom History: She remains on the fluticasone nasal spray. Rhinitis is still an issue but she is having larger problems at this point in time.   She has been on a downslope over the last few months per her son.  Otherwise, there have been no changes to her past medical history, surgical history, family history, or social history.  Assessment and Plan:  [name] is a 86 y.o. female with:  Chronic obstructive pulmonary disease - with current flare versus URI   Non-allergic rhinitis   Complicated past medical history including dementia as well as congestive heart failure   She has not made much improvement with her antibiotic. Therefore we are going to add on prednisolone to see if this will clear it up more effectively. I do not think that an antibiotic change is indicated at this point in time. Strict return precautions discussed.   Diagnostics: None.  Medication List:  Current Outpatient Medications  Medication Sig Dispense Refill   acetaminophen (TYLENOL) 160 MG/5ML liquid Take 320 mg by mouth in the morning and at bedtime.     budesonide (PULMICORT) 0.25 MG/2ML nebulizer solution Take 0.25 mg by nebulization 2 (two) times daily.     Carbinoxamine Maleate 4 MG TABS TAKE 1 TABLET IN  MORNING AND 1 AT BEDTIME 60 tablet 5   DM-APAP-CPM (CORICIDIN HBP PO) Take 30 mLs by mouth in the morning and at bedtime. Liquid     formoterol (PERFOROMIST) 20 MCG/2ML nebulizer solution TAKE 2 MLS (20 MCG TOTAL) BY NEBULIZATION 2 (TWO) TIMES DAILY. 120 mL 2   prednisoLONE (ORAPRED) 15 MG/5ML solution Take 10 mL twice daily for four days, then 7.5 mL twice daily for four days,  then 5 mL twice daily for four days, then 5 mL daily for four days, and then STOP. 220 mL 0   propranolol (INDERAL) 20 MG tablet TAKE 1 TABLET BY MOUTH TWICE A DAY 30 tablet 0   No current facility-administered medications for this visit.   Allergies: Allergies  Allergen Reactions   Lidocaine Anaphylaxis, Swelling, Rash and Other (See Comments)    Any of the " [place]  Family "   Other Other (See Comments)    All drugs that end in "cane'-  novacane etc. Daughter states patient almost died when she had it when she was born   I reviewed her past medical history, social history, family history, and environmental history and no significant changes have been reported from previous visits.  Review of Systems  Constitutional:  Negative for activity change and appetite change.  HENT:  Positive for postnasal drip. Negative for congestion, rhinorrhea, sinus pressure and sore throat.   Eyes:  Negative for pain, discharge, redness and itching.  Respiratory:  Positive for cough, shortness of breath and wheezing. Negative for stridor.   Gastrointestinal:  Negative for diarrhea, nausea and vomiting.  Endocrine: Positive for cold intolerance and heat intolerance.  Musculoskeletal:  Negative for arthralgias, joint swelling and myalgias.  Skin:  Negative for rash.  Allergic/Immunologic: Positive for environmental allergies. Negative for food allergies.    Objective:  Physical exam not obtained as encounter was done via telephone.   Previous notes and tests were reviewed.  I discussed the assessment and treatment plan with the patient. The patient was provided an opportunity to ask questions and all were answered. The patient agreed with the plan and demonstrated an understanding of the instructions.   The patient was advised to call back or seek an in-person evaluation if the symptoms worsen or if the condition fails to improve as anticipated.  I provided 25 minutes of non-face-to-face time during  this encounter.  It was my pleasure to participate in [place] care today. Please feel free to contact me with any questions or concerns.   Sincerely,  Valentina Shaggy, MD

## 2022-10-17 ENCOUNTER — Inpatient Hospital Stay (HOSPITAL_COMMUNITY)
Admission: EM | Admit: 2022-10-17 | Discharge: 2022-10-20 | DRG: 640 | Disposition: A | Discharge status: Hospice | Payer: Medicare Other | Attending: Internal Medicine | Admitting: Internal Medicine

## 2022-10-17 ENCOUNTER — Encounter (HOSPITAL_COMMUNITY): Payer: Self-pay

## 2022-10-17 ENCOUNTER — Other Ambulatory Visit: Payer: Self-pay

## 2022-10-17 ENCOUNTER — Emergency Department (HOSPITAL_COMMUNITY): Payer: Medicare Other

## 2022-10-17 DIAGNOSIS — E860 Dehydration: Principal | ICD-10-CM | POA: Diagnosis present

## 2022-10-17 DIAGNOSIS — G9341 Metabolic encephalopathy: Secondary | ICD-10-CM | POA: Diagnosis present

## 2022-10-17 DIAGNOSIS — Z87440 Personal history of urinary (tract) infections: Secondary | ICD-10-CM | POA: Diagnosis not present

## 2022-10-17 DIAGNOSIS — E559 Vitamin D deficiency, unspecified: Secondary | ICD-10-CM | POA: Diagnosis present

## 2022-10-17 DIAGNOSIS — R32 Unspecified urinary incontinence: Secondary | ICD-10-CM | POA: Diagnosis present

## 2022-10-17 DIAGNOSIS — E870 Hyperosmolality and hypernatremia: Secondary | ICD-10-CM | POA: Diagnosis present

## 2022-10-17 DIAGNOSIS — J329 Chronic sinusitis, unspecified: Secondary | ICD-10-CM | POA: Diagnosis not present

## 2022-10-17 DIAGNOSIS — Z87891 Personal history of nicotine dependence: Secondary | ICD-10-CM | POA: Diagnosis not present

## 2022-10-17 DIAGNOSIS — Z8639 Personal history of other endocrine, nutritional and metabolic disease: Secondary | ICD-10-CM | POA: Diagnosis not present

## 2022-10-17 DIAGNOSIS — J309 Allergic rhinitis, unspecified: Secondary | ICD-10-CM | POA: Diagnosis not present

## 2022-10-17 DIAGNOSIS — R008 Other abnormalities of heart beat: Secondary | ICD-10-CM

## 2022-10-17 DIAGNOSIS — L89151 Pressure ulcer of sacral region, stage 1: Secondary | ICD-10-CM | POA: Diagnosis present

## 2022-10-17 DIAGNOSIS — R634 Abnormal weight loss: Secondary | ICD-10-CM | POA: Diagnosis not present

## 2022-10-17 DIAGNOSIS — Z803 Family history of malignant neoplasm of breast: Secondary | ICD-10-CM

## 2022-10-17 DIAGNOSIS — E440 Moderate protein-calorie malnutrition: Secondary | ICD-10-CM | POA: Diagnosis not present

## 2022-10-17 DIAGNOSIS — Z825 Family history of asthma and other chronic lower respiratory diseases: Secondary | ICD-10-CM

## 2022-10-17 DIAGNOSIS — K219 Gastro-esophageal reflux disease without esophagitis: Secondary | ICD-10-CM | POA: Diagnosis not present

## 2022-10-17 DIAGNOSIS — R404 Transient alteration of awareness: Secondary | ICD-10-CM | POA: Diagnosis not present

## 2022-10-17 DIAGNOSIS — E785 Hyperlipidemia, unspecified: Secondary | ICD-10-CM | POA: Diagnosis not present

## 2022-10-17 DIAGNOSIS — G309 Alzheimer's disease, unspecified: Secondary | ICD-10-CM | POA: Diagnosis present

## 2022-10-17 DIAGNOSIS — Z66 Do not resuscitate: Secondary | ICD-10-CM | POA: Diagnosis present

## 2022-10-17 DIAGNOSIS — Z515 Encounter for palliative care: Secondary | ICD-10-CM

## 2022-10-17 DIAGNOSIS — I2510 Atherosclerotic heart disease of native coronary artery without angina pectoris: Secondary | ICD-10-CM | POA: Diagnosis present

## 2022-10-17 DIAGNOSIS — M1990 Unspecified osteoarthritis, unspecified site: Secondary | ICD-10-CM | POA: Diagnosis not present

## 2022-10-17 DIAGNOSIS — N183 Chronic kidney disease, stage 3 unspecified: Secondary | ICD-10-CM | POA: Diagnosis not present

## 2022-10-17 DIAGNOSIS — R000 Tachycardia, unspecified: Secondary | ICD-10-CM | POA: Diagnosis not present

## 2022-10-17 DIAGNOSIS — J449 Chronic obstructive pulmonary disease, unspecified: Secondary | ICD-10-CM | POA: Diagnosis not present

## 2022-10-17 DIAGNOSIS — R739 Hyperglycemia, unspecified: Secondary | ICD-10-CM | POA: Diagnosis present

## 2022-10-17 DIAGNOSIS — Z7401 Bed confinement status: Secondary | ICD-10-CM

## 2022-10-17 DIAGNOSIS — F0280 Dementia in other diseases classified elsewhere without behavioral disturbance: Secondary | ICD-10-CM | POA: Diagnosis not present

## 2022-10-17 DIAGNOSIS — L8990 Pressure ulcer of unspecified site, unspecified stage: Secondary | ICD-10-CM | POA: Insufficient documentation

## 2022-10-17 DIAGNOSIS — Z7189 Other specified counseling: Secondary | ICD-10-CM | POA: Diagnosis not present

## 2022-10-17 DIAGNOSIS — R0689 Other abnormalities of breathing: Secondary | ICD-10-CM | POA: Diagnosis not present

## 2022-10-17 DIAGNOSIS — A419 Sepsis, unspecified organism: Secondary | ICD-10-CM | POA: Diagnosis not present

## 2022-10-17 DIAGNOSIS — Z20822 Contact with and (suspected) exposure to covid-19: Secondary | ICD-10-CM | POA: Diagnosis present

## 2022-10-17 DIAGNOSIS — I5032 Chronic diastolic (congestive) heart failure: Secondary | ICD-10-CM | POA: Diagnosis present

## 2022-10-17 DIAGNOSIS — E861 Hypovolemia: Secondary | ICD-10-CM | POA: Diagnosis present

## 2022-10-17 DIAGNOSIS — Z833 Family history of diabetes mellitus: Secondary | ICD-10-CM

## 2022-10-17 DIAGNOSIS — N1832 Chronic kidney disease, stage 3b: Secondary | ICD-10-CM | POA: Diagnosis not present

## 2022-10-17 DIAGNOSIS — R55 Syncope and collapse: Secondary | ICD-10-CM | POA: Diagnosis not present

## 2022-10-17 DIAGNOSIS — F419 Anxiety disorder, unspecified: Secondary | ICD-10-CM | POA: Diagnosis not present

## 2022-10-17 DIAGNOSIS — F01C11 Vascular dementia, severe, with agitation: Secondary | ICD-10-CM | POA: Diagnosis not present

## 2022-10-17 DIAGNOSIS — Z9981 Dependence on supplemental oxygen: Secondary | ICD-10-CM | POA: Diagnosis not present

## 2022-10-17 DIAGNOSIS — I679 Cerebrovascular disease, unspecified: Secondary | ICD-10-CM | POA: Diagnosis not present

## 2022-10-17 DIAGNOSIS — M810 Age-related osteoporosis without current pathological fracture: Secondary | ICD-10-CM | POA: Diagnosis not present

## 2022-10-17 DIAGNOSIS — I130 Hypertensive heart and chronic kidney disease with heart failure and stage 1 through stage 4 chronic kidney disease, or unspecified chronic kidney disease: Secondary | ICD-10-CM | POA: Diagnosis present

## 2022-10-17 DIAGNOSIS — I6529 Occlusion and stenosis of unspecified carotid artery: Secondary | ICD-10-CM | POA: Diagnosis not present

## 2022-10-17 DIAGNOSIS — Z0389 Encounter for observation for other suspected diseases and conditions ruled out: Secondary | ICD-10-CM | POA: Diagnosis not present

## 2022-10-17 DIAGNOSIS — R0602 Shortness of breath: Secondary | ICD-10-CM | POA: Diagnosis not present

## 2022-10-17 DIAGNOSIS — Z8249 Family history of ischemic heart disease and other diseases of the circulatory system: Secondary | ICD-10-CM

## 2022-10-17 DIAGNOSIS — R627 Adult failure to thrive: Secondary | ICD-10-CM | POA: Diagnosis present

## 2022-10-17 DIAGNOSIS — Z79899 Other long term (current) drug therapy: Secondary | ICD-10-CM

## 2022-10-17 LAB — CBC WITH DIFFERENTIAL/PLATELET
Abs Immature Granulocytes: 0.03 10*3/uL (ref 0.00–0.07)
Basophils Absolute: 0 10*3/uL (ref 0.0–0.1)
Basophils Relative: 0 %
Eosinophils Absolute: 0 10*3/uL (ref 0.0–0.5)
Eosinophils Relative: 0 %
HCT: 47.4 % — ABNORMAL HIGH (ref 36.0–46.0)
Hemoglobin: 13.8 g/dL (ref 12.0–15.0)
Immature Granulocytes: 0 %
Lymphocytes Relative: 14 %
Lymphs Abs: 1.3 10*3/uL (ref 0.7–4.0)
MCH: 29.7 pg (ref 26.0–34.0)
MCHC: 29.1 g/dL — ABNORMAL LOW (ref 30.0–36.0)
MCV: 102.2 fL — ABNORMAL HIGH (ref 80.0–100.0)
Monocytes Absolute: 0.2 10*3/uL (ref 0.1–1.0)
Monocytes Relative: 2 %
Neutro Abs: 7.4 10*3/uL (ref 1.7–7.7)
Neutrophils Relative %: 84 %
Platelets: 200 10*3/uL (ref 150–400)
RBC: 4.64 MIL/uL (ref 3.87–5.11)
RDW: 13 % (ref 11.5–15.5)
WBC: 8.9 10*3/uL (ref 4.0–10.5)
nRBC: 0 % (ref 0.0–0.2)

## 2022-10-17 LAB — COMPREHENSIVE METABOLIC PANEL
ALT: 13 U/L (ref 0–44)
AST: 20 U/L (ref 15–41)
Albumin: 3 g/dL — ABNORMAL LOW (ref 3.5–5.0)
Alkaline Phosphatase: 47 U/L (ref 38–126)
Anion gap: 8 (ref 5–15)
BUN: 35 mg/dL — ABNORMAL HIGH (ref 8–23)
CO2: 31 mmol/L (ref 22–32)
Calcium: 9.5 mg/dL (ref 8.9–10.3)
Chloride: 113 mmol/L — ABNORMAL HIGH (ref 98–111)
Creatinine, Ser: 0.89 mg/dL (ref 0.44–1.00)
GFR, Estimated: >60 mL/min (ref >60)
Glucose, Bld: 150 mg/dL — ABNORMAL HIGH (ref 70–99)
Potassium: 4.2 mmol/L (ref 3.5–5.1)
Sodium: 152 mmol/L — ABNORMAL HIGH (ref 135–145)
Total Bilirubin: 1 mg/dL (ref 0.3–1.2)
Total Protein: 6.4 g/dL — ABNORMAL LOW (ref 6.5–8.1)

## 2022-10-17 LAB — URINALYSIS, ROUTINE W REFLEX MICROSCOPIC
Bacteria, UA: NONE SEEN
Bilirubin Urine: NEGATIVE
Glucose, UA: NEGATIVE mg/dL
Hgb urine dipstick: NEGATIVE
Ketones, ur: 5 mg/dL — AB
Leukocytes,Ua: NEGATIVE
Nitrite: NEGATIVE
Protein, ur: 30 mg/dL — AB
Specific Gravity, Urine: 1.023 (ref 1.005–1.030)
pH: 5 (ref 5.0–8.0)

## 2022-10-17 LAB — APTT: aPTT: 20 seconds — ABNORMAL LOW (ref 24–36)

## 2022-10-17 LAB — PROTIME-INR
INR: 1.1 (ref 0.8–1.2)
Prothrombin Time: 14.5 seconds (ref 11.4–15.2)

## 2022-10-17 LAB — LACTIC ACID, PLASMA: Lactic Acid, Venous: 1 mmol/L (ref 0.5–1.9)

## 2022-10-17 LAB — RESP PANEL BY RT-PCR (FLU A&B, COVID) ARPGX2
Influenza A by PCR: NEGATIVE
Influenza B by PCR: NEGATIVE
SARS Coronavirus 2 by RT PCR: NEGATIVE

## 2022-10-17 MED ORDER — HYDROMORPHONE HCL 1 MG/ML IJ SOLN
0.5000 mg | INTRAMUSCULAR | Status: DC | PRN
  Administered 2022-10-17 – 2022-10-20 (×3): 0.5 mg via INTRAVENOUS
  Filled 2022-10-17: qty 0.5
  Filled 2022-10-17: qty 1
  Filled 2022-10-17: qty 0.5

## 2022-10-17 MED ORDER — ENOXAPARIN SODIUM 40 MG/0.4ML IJ SOSY
40.0000 mg | PREFILLED_SYRINGE | INTRAMUSCULAR | Status: DC
  Administered 2022-10-18: 40 mg via SUBCUTANEOUS
  Filled 2022-10-17: qty 0.4

## 2022-10-17 MED ORDER — HYDRALAZINE HCL 20 MG/ML IJ SOLN: 5.0000 mg | Freq: Four times a day (QID) | INTRAMUSCULAR | Status: DC | PRN

## 2022-10-17 MED ORDER — DEXTROSE-NACL 5-0.2 % IV SOLN: INTRAVENOUS | Status: DC

## 2022-10-17 MED ORDER — SODIUM CHLORIDE 0.9 % IV SOLN
3.0000 g | Freq: Four times a day (QID) | INTRAVENOUS | Status: DC
  Administered 2022-10-18 – 2022-10-20 (×10): 3 g via INTRAVENOUS
  Filled 2022-10-17 (×10): qty 8

## 2022-10-17 MED ORDER — ACETAMINOPHEN 650 MG RE SUPP
325.0000 mg | RECTAL | Status: DC | PRN
  Administered 2022-10-18: 325 mg via RECTAL
  Filled 2022-10-17: qty 1

## 2022-10-17 MED ORDER — SODIUM CHLORIDE 0.9 % IV BOLUS
1000.0000 mL | Freq: Once | INTRAVENOUS | Status: AC
  Administered 2022-10-17: 1000 mL via INTRAVENOUS

## 2022-10-17 MED ORDER — INSULIN ASPART 100 UNIT/ML IJ SOLN
0.0000 [IU] | INTRAMUSCULAR | Status: DC
  Administered 2022-10-18 (×2): 1 [IU] via SUBCUTANEOUS

## 2022-10-17 MED ORDER — BISACODYL 10 MG RE SUPP: 10.0000 mg | Freq: Every day | RECTAL | Status: DC | PRN

## 2022-10-17 MED ORDER — ENOXAPARIN SODIUM 30 MG/0.3ML IJ SOSY: 30.0000 mg | PREFILLED_SYRINGE | INTRAMUSCULAR | Status: DC

## 2022-10-17 MED ORDER — LACTATED RINGERS IV BOLUS (SEPSIS)
1000.0000 mL | Freq: Once | INTRAVENOUS | Status: AC
  Administered 2022-10-17: 1000 mL via INTRAVENOUS

## 2022-10-17 MED ORDER — FENTANYL CITRATE PF 50 MCG/ML IJ SOSY
25.0000 ug | PREFILLED_SYRINGE | Freq: Once | INTRAMUSCULAR | Status: AC
  Administered 2022-10-17: 25 ug via INTRAVENOUS
  Filled 2022-10-17: qty 1

## 2022-10-17 NOTE — Progress Notes (Signed)
RT in to assess pt. Pt arrives on 2L nasal cannula, SpO2 96%. No distress noted but pt taking shallow breaths. Diminished/fine crackles heard bilaterally. No interventions to add at this time. RT will  continue to monitor and be available as needed.

## 2022-10-17 NOTE — Progress Notes (Signed)
   10/17/22 1930  Clinical Encounter Type  Visited With Family  Visit Type Initial;Spiritual support  Referral From Chaplain  Consult/Referral To [place] met a family member looking for a particular room. I brought her where she wished to go. A short time later the patient's nurse reached out because the daughter of the patient would like to meet with me. I met with [name] who asked for pray for her mother as well as for herself. She is stressed about the circumstances surrounding her mother and wishes that her mother not suffer. I prayed for clear minds and compassion to prevail in Virginia [name]'s situation and that of her mother.   Danice Goltz [facility]  [phone]

## 2022-10-17 NOTE — ED Provider Notes (Signed)
[place] EMERGENCY DEPARTMENT Provider Note   CSN: [id number] Arrival date & time: 10/17/22  1557     History  Chief Complaint  Patient presents with   Aspiration    Virginia [name] is a 86 y.o. female.  Patient is an 86 year old female with a past medical history of dementia, nonverbal and bedbound at baseline, hypertension, CHF, COPD on 2 L nasal cannula and CKD presenting to the emergency department with fevers.  Patient lives at home with family who states that she has had fevers for the last few days.  She states that they are concerned that she may have aspirated or could have had a UTI.  Her family states that she has had recent congestion and was seen by her palliative doctor who started her on antibiotics and steroids for sinusitis yesterday.  Her doctor recommended that she come to the emergency department though if she had no significant improvement.  Her daughter states that she has had not an appetite the last few days and has only been taking small amounts of food or drink with her medications.  They state that she is currently full code.  The history is provided by a relative. The history is limited by the condition of the patient.       Home Medications Prior to Admission medications   Medication Sig Start Date End Date Taking? Authorizing Provider  acetaminophen (TYLENOL) 160 MG/5ML liquid Take 320 mg by mouth in the morning and at bedtime.   Yes [provider]  amLODipine (NORVASC) 5 MG tablet 5 mg by oral route. 04/28/19  Yes [provider]  amoxicillin-clavulanate (AUGMENTIN) 600-42.9 MG/5ML suspension amoxicillin 600 mg-potassium clavulanate 42.9 mg/5 mL oral suspension   Yes [provider]  budesonide (PULMICORT) 0.25 MG/2ML nebulizer solution Take 0.25 mg by nebulization 2 (two) times daily. 01/10/22  Yes [provider]  CETIRIZINE HCL CHILDRENS ALRGY 1 MG/ML SOLN Take by mouth. 10/16/22  Yes [provider]  formoterol (PERFOROMIST) 20 MCG/2ML nebulizer solution TAKE 2 MLS (20 MCG TOTAL) BY NEBULIZATION 2 (TWO) TIMES DAILY. 07/17/22 10/17/22 Yes Valentina Shaggy, MD  prednisoLONE (ORAPRED) 15 MG/5ML solution Take 10 mL twice daily for four days, then 7.5 mL twice daily for four days, then 5 mL twice daily for four days, then 5 mL daily for four days, and then STOP. 07/22/22  Yes Valentina Shaggy, MD  prednisoLONE (PRELONE) 15 MG/5ML SOLN Take 10 mLs (30 mg total) by mouth 2 (two) times daily for 7 days. 10/16/22 10/23/22 Yes Valentina Shaggy, MD  propranolol (INDERAL) 20 MG tablet TAKE 1 TABLET BY MOUTH TWICE A DAY 07/18/22  Yes Fay Records, MD  Carbinoxamine Maleate 4 MG TABS TAKE 1 TABLET IN MORNING AND 1 AT BEDTIME 09/02/22   Valentina Shaggy, MD  DM-APAP-CPM (CORICIDIN HBP PO) Take 30 mLs by mouth in the morning and at bedtime. Liquid    [provider]      Allergies    Lidocaine and Other    Review of Systems   Review of Systems  Physical Exam Updated Vital Signs BP (!) 140/68   Pulse 60   Temp 98.7 F (37.1 C) (Axillary)   Resp (!) 21   LMP  (LMP Unknown)   SpO2 100%  Physical Exam Vitals reviewed.  Constitutional:      General: She is not in acute distress.    Comments: Drowsy, intermittently opening eyes and looking at family members  HENT:     Head: Normocephalic and atraumatic.     Nose: Nose normal.     Mouth/Throat:     Mouth: Mucous membranes are dry.     Pharynx: Oropharynx is clear.  Eyes:     Extraocular Movements: Extraocular movements intact.     Conjunctiva/sclera: Conjunctivae normal.     Pupils: Pupils are equal, round, and reactive to light.  Cardiovascular:     Rate and Rhythm: Regular rhythm. Tachycardia present.     Pulses: Normal pulses.     Heart sounds: Normal heart sounds.  Pulmonary:     Effort: Pulmonary effort is normal.     Breath sounds: Normal breath sounds.  Abdominal:     General: Abdomen is  flat.     Palpations: Abdomen is soft.     Tenderness: There is no abdominal tenderness.  Musculoskeletal:     Cervical back: Normal range of motion and neck supple.     Right lower leg: No edema.     Left lower leg: No edema.  Skin:    General: Skin is warm and dry.  Neurological:     Mental Status: Mental status is at baseline.     Comments: Oriented x0     ED Results / Procedures / Treatments   Labs (all labs ordered are listed, but only abnormal results are displayed) Labs Reviewed  COMPREHENSIVE METABOLIC PANEL - Abnormal; Notable for the following components:      Result Value   Sodium 152 (*)    Chloride 113 (*)    Glucose, Bld 150 (*)    BUN 35 (*)    Total Protein 6.4 (*)    Albumin 3.0 (*)    All other components within normal limits  CBC WITH DIFFERENTIAL/PLATELET - Abnormal; Notable for the following components:   HCT 47.4 (*)    MCV 102.2 (*)    MCHC 29.1 (*)    All other components within normal limits  APTT - Abnormal; Notable for the following components:   aPTT 20 (*)    All other components within normal limits  URINALYSIS, ROUTINE W REFLEX MICROSCOPIC - Abnormal; Notable for the following components:   Ketones, ur 5 (*)    Protein, ur 30 (*)    All other components within normal limits  CULTURE, BLOOD (ROUTINE X 2)  CULTURE, BLOOD (ROUTINE X 2)  URINE CULTURE  RESP PANEL BY RT-PCR (FLU A&B, COVID) ARPGX2  LACTIC ACID, PLASMA  PROTIME-INR    EKG EKG Interpretation  Date/Time:  Friday October 17 2022 19:32:40 EST Ventricular Rate:  116 PR Interval:  143 QRS Duration: 87 QT Interval:  347 QTC Calculation: 482 R Axis:   83 Text Interpretation: Sinus tachycardia Supraventricular bigeminy Borderline right axis deviation Bigeminy new compared to prior EKG Confirmed by Leanord Asal ([id number]) on 10/17/2022 7:47:14 PM  Radiology DG Chest Port 1 View  Result Date: 10/17/2022 CLINICAL DATA:  Questionable sepsis.  Consideration for aspiration.  EXAM: PORTABLE CHEST 1 VIEW COMPARISON:  January 27, 2022. FINDINGS: EKG leads project over the chest. Image rotated to the LEFT, accounting for this cardiomediastinal contours and hilar structures are stable. Lungs are clear.  No pneumothorax. On limited assessment no acute skeletal findings. IMPRESSION: No acute cardiopulmonary disease. Electronically Signed   By: Zetta Bills M.D.   On: 10/17/2022 16:44    Procedures Procedures    Medications Ordered in ED Medications  lactated ringers bolus 1,000 mL (0 mLs Intravenous Stopped 10/17/22 1806)  sodium chloride 0.9 % bolus 1,000 mL (1,000 mLs Intravenous New Bag/Given 10/17/22 2017)  fentaNYL (SUBLIMAZE) injection 25 mcg (25 mcg Intravenous Given 10/17/22 2019)    ED Course/ Medical Decision Making/ A&P Clinical Course as of 10/17/22 2105  Fri Oct 17, 2022  1948 On reassessment, the patient's labs show hypernatremia 152 likely due to dehydration from patient's poor appetite likely due to her dementia.  She will continue to be fluid resuscitated. No obvious source of infection at this time.  Urine is pending.  She will require admission for rehydration and further discussion for goals of care. [VK]  2104 Patient signed out to Dr. Nevada Crane, hospitalist, for admission. [VK]    Clinical Course User Index [VK] Kemper Durie, DO                           Medical Decision Making This patient presents to the ED with chief complaint(s) of fever with pertinent past medical history of dementia nonverbal and bedbound at baseline, CHF, hypertension, COPD on 2 L nasal cannula which further complicates the presenting complaint. The complaint involves an extensive differential diagnosis and also carries with it a high risk of complications and morbidity.    The differential diagnosis includes sepsis, aspiration, pneumonia, viral syndrome, UTI, dehydration, electrolyte abnormality, AKI/ARF  Additional history obtained: Additional history obtained  from family Records reviewed previous admission documents and Primary Care Documents  ED Course and Reassessment: On patient's arrival to the emergency department she is drowsy but seems to be at her neurologic baseline per her family and no acute respiratory distress.  She is tachycardic and has significantly dry mucous membranes concerning for dehydration and will be started on IV fluids.  She has been having fevers at home between 100.8 and 101.4 and will have sepsis work-up performed here.  Independent labs interpretation:  The following labs were independently interpreted: Hyponatremia, otherwise within normal range  Independent visualization of imaging: - I independently visualized the following imaging with scope of interpretation limited to determining acute life threatening conditions related to emergency care: Chest x-ray, which revealed no acute disease  Consultation: - Consulted or discussed management/test interpretation w/ external professional: Hospitalist  Consideration for admission or further workup: Patient requires admission for her hypernatremia and failure to thrive Social Determinants of health: N/A    Amount and/or Complexity of Data Reviewed Labs: ordered. Radiology: ordered. ECG/medicine tests: ordered.  Risk Prescription drug management. Decision regarding hospitalization.          Final Clinical Impression(s) / ED Diagnoses Final diagnoses:  Hypernatremia  Supraventricular bigeminy  Dehydration    Rx / DC Orders ED Discharge Orders     None         Kemper Durie, DO 10/17/22 2105

## 2022-10-17 NOTE — ED Triage Notes (Signed)
Pt BIB [facility] from home after family called because patient is not responding. Patient is unresponsive, on palliative care. Family reports to EMS patient has not eaten in a couple days and has been more unresponsive.

## 2022-10-17 NOTE — H&P (Signed)
History and Physical  Virginia [name] ZHY:[id number] DOB: 05/09/1936 DOA: 10/17/2022  Referring physician: Dr. Maylon Peppers  PCP: Clovia Cuff, MD  Outpatient Specialists: Palliative Care Medicine Patient coming from: Home, has 2 caregivers.  Chief Complaint: Failure to thrive   HPI: Virginia [name] is a 86 y.o. female with medical history significant for dementia, hypertension, chronic hypoxia on 2 L nasal cannula continuously, who presented to [facility] ED from home via EMS due to failure to thrive, poor oral intake and lethargy.  The history is obtained from the patient's daughter at bedside and also from [place] and review of medical records.  The patient has had a significant decrease in appetite and oral intake for the past 2 days.  Associated with subjective fevers.  Prior to presentation she had a fever with a Tmax of 100.4.  Recently diagnosed with sinusitis, she was prescribed antibiotics, Augmentin, and steroids, prednisone.  In the ED, lethargic, hypovolemic and hypernatremic with serum sodium of 152.  UA negative for pyuria.  No clear evidence of pneumonia on chest x-ray.  Tachycardic.  She was started on IV fluid hydration.  TRH, hospitalist service was asked to admit.  ED Course: Tmax 98.7.  BP 145/64, pulse 113, respiratory rate 15, O2 saturation 100% on 2 L nasal cannula  Review of Systems: Review of systems as noted in the HPI. All other systems reviewed and are negative.   Past Medical History:  Diagnosis Date   Anxiety 07/08/2017   Arthritis    Carotid artery disease ([place])    a. s/p L CEA. b. followed by [facility].   Chronic diastolic CHF (congestive heart failure) ([place])    CKD (chronic kidney disease), stage III ([place])    COPD (chronic obstructive pulmonary disease) ([place])    COPD GOLD IV 12/30/2014   PFTs 12/15/14 FEV1  0.57 (29%) ratio 54 p 17% resp to saba     Cough 01/31/2016   Dementia with behavioral disturbance ([place]) 07/08/2017   Diverticulosis    Dyspnea    Essential hypertension  12/02/2014   Fall at home Sept. 2013   Family history of adverse reaction to anesthesia    SON IS SLOW TO WAKE    Gastroesophageal reflux disease 07/08/2017   Hyperlipidemia    Hypertension    Hyponatremia 12/02/2014   Hypotension 02/01/2017   Incontinent of urine    Internal hemorrhoid    Intertrochanteric fracture ([place]) 04/20/2019   Memory disorder 10/24/2014   Nausea and vomiting 11/30/2014   Palliative care status    Primary osteoarthritis involving multiple joints 07/08/2017   Vitamin D deficiency 02/15/2015   Past Surgical History:  Procedure Laterality Date   APPENDECTOMY     BREAST REDUCTION SURGERY     CAROTID ENDARTERECTOMY     left CEA   CATARACT EXTRACTION Bilateral    COLONOSCOPY  2015   EYE SURGERY     cataracts removed, bilaterally    INTRAMEDULLARY (IM) NAIL INTERTROCHANTERIC Right 04/21/2019   Procedure: INTRAMEDULLARY (IM) NAIL INTERTROCHANTRIC;  Surgeon: Rod Can, MD;  Location: [place];  Service: Orthopedics;  Laterality: Right;   skin cancer removal     TONSILLECTOMY      Social History:  reports that she quit smoking about 3 years ago. Her smoking use included cigarettes. She has a 5.00 pack-year smoking history. She has never used smokeless tobacco. She reports that she does not currently use alcohol. She reports that she does not use drugs.   Allergies  Allergen Reactions  Lidocaine Anaphylaxis, Swelling, Rash and Other (See Comments)    Any of the " Caine  Family "   Other Other (See Comments)    All drugs that end in "cane'-  novacane etc. Daughter states patient almost died when she had it when she was born    Family History  Problem Relation Age of Onset   Heart disease Father        Before age 71   Hypertension Father    Heart attack Father    Cancer Mother 35       Brain   Dementia Brother    Cancer Maternal Aunt 61       breast cancer   Diabetes Maternal Aunt    High blood pressure Brother    Hypothyroidism Daughter    High blood  pressure Son    High Cholesterol Son    Diabetes Son    COPD Son    Obesity Son    Heart failure Maternal Grandmother    Diabetes Maternal Grandmother    Stroke Neg Hx       Prior to Admission medications   Medication Sig Start Date End Date Taking? Authorizing Provider  acetaminophen (TYLENOL) 160 MG/5ML liquid Take 320 mg by mouth in the morning and at bedtime.   Yes [provider]  amLODipine (NORVASC) 5 MG tablet 5 mg by oral route. 04/28/19  Yes [provider]  amoxicillin-clavulanate (AUGMENTIN) 600-42.9 MG/5ML suspension amoxicillin 600 mg-potassium clavulanate 42.9 mg/5 mL oral suspension   Yes [provider]  budesonide (PULMICORT) 0.25 MG/2ML nebulizer solution Take 0.25 mg by nebulization 2 (two) times daily. 01/10/22  Yes [provider]  CETIRIZINE HCL CHILDRENS ALRGY 1 MG/ML SOLN Take by mouth. 10/16/22  Yes [provider]  formoterol (PERFOROMIST) 20 MCG/2ML nebulizer solution TAKE 2 MLS (20 MCG TOTAL) BY NEBULIZATION 2 (TWO) TIMES DAILY. 07/17/22 10/17/22 Yes Valentina Shaggy, MD  prednisoLONE (ORAPRED) 15 MG/5ML solution Take 10 mL twice daily for four days, then 7.5 mL twice daily for four days, then 5 mL twice daily for four days, then 5 mL daily for four days, and then STOP. 07/22/22  Yes Valentina Shaggy, MD  prednisoLONE (PRELONE) 15 MG/5ML SOLN Take 10 mLs (30 mg total) by mouth 2 (two) times daily for 7 days. 10/16/22 10/23/22 Yes Valentina Shaggy, MD  propranolol (INDERAL) 20 MG tablet TAKE 1 TABLET BY MOUTH TWICE A DAY 07/18/22  Yes Fay Records, MD  Carbinoxamine Maleate 4 MG TABS TAKE 1 TABLET IN MORNING AND 1 AT BEDTIME 09/02/22   Valentina Shaggy, MD  DM-APAP-CPM (CORICIDIN HBP PO) Take 30 mLs by mouth in the morning and at bedtime. Liquid    [provider]    Physical Exam: BP (!) 140/68   Pulse 60   Temp 98.7 F (37.1 C) (Axillary)   Resp (!) 21   LMP  (LMP Unknown)   SpO2 100%    General: 86 y.o. year-old female frail-appearing.  Lethargic. Cardiovascular: Tachycardic with no rubs or gallops.  No thyromegaly or JVD noted.  No lower extremity edema. 2/4 pulses in all 4 extremities. Respiratory: Clear to auscultation with no wheezes or rales. Good inspiratory effort. Abdomen: Soft nontender nondistended with normal bowel sounds x4 quadrants. Muskuloskeletal: No cyanosis, clubbing or edema noted bilaterally Neuro: CN II-XII intact, strength, sensation, reflexes Skin: No ulcerative lesions noted or rashes Psychiatry: Unable to assess judgment due to lethargy.  Labs on Admission:  Basic Metabolic Panel: Recent Labs  Lab 10/17/22 1720  NA 152*  K 4.2  CL 113*  CO2 31  GLUCOSE 150*  BUN 35*  CREATININE 0.89  CALCIUM 9.5   Liver Function Tests: Recent Labs  Lab 10/17/22 1720  AST 20  ALT 13  ALKPHOS 47  BILITOT 1.0  PROT 6.4*  ALBUMIN 3.0*   No results for input(s): "LIPASE", "AMYLASE" in the last 168 hours. No results for input(s): "AMMONIA" in the last 168 hours. CBC: Recent Labs  Lab 10/17/22 1720  WBC 8.9  NEUTROABS 7.4  HGB 13.8  HCT 47.4*  MCV 102.2*  PLT 200   Cardiac Enzymes: No results for input(s): "CKTOTAL", "CKMB", "CKMBINDEX", "TROPONINI" in the last 168 hours.  BNP (last 3 results) Recent Labs    01/26/22 1312  BNP 550.5*    ProBNP (last 3 results) No results for input(s): "PROBNP" in the last 8760 hours.  CBG: No results for input(s): "GLUCAP" in the last 168 hours.  Radiological Exams on Admission: DG Chest Port 1 View  Result Date: 10/17/2022 CLINICAL DATA:  Questionable sepsis.  Consideration for aspiration. EXAM: PORTABLE CHEST 1 VIEW COMPARISON:  January 27, 2022. FINDINGS: EKG leads project over the chest. Image rotated to the LEFT, accounting for this cardiomediastinal contours and hilar structures are stable. Lungs are clear.  No pneumothorax. On limited assessment no acute skeletal findings.  IMPRESSION: No acute cardiopulmonary disease. Electronically Signed   By: Zetta Bills M.D.   On: 10/17/2022 16:44    EKG: I independently viewed the EKG done and my findings are as followed: Sinus tachycardia rate of 116.  Nonspecific ST-T changes.  QTc 482.  Assessment/Plan Present on Admission:  Hypernatremia  Principal Problem:   Hypernatremia  Hypovolemic hypernatremia, likely secondary to dehydration from poor oral intake Presented with serum sodium of 152 Start D5/0.2 NS at 50 cc/h BMP every 3 hours Avoid quick correction of hypernatremia  Acute metabolic encephalopathy in the setting of hypernatremia Baseline dementia, nonverbal, bedbound Treat underlying conditions. Still lethargic. Aspiration precautions. Follow TSH, ammonia  Hyperglycemia Presented with serum glucose of 150 Currently on D50.2 NS at 50 cc/h Obtain hemoglobin A1c Start insulin sliding scale every 4 hours while NPO.  Recently diagnosed sinusitis On home Augmentin IV Unasyn while NPO.  N.p.o. due to lethargy.  Goals of care Palliative care medicine consulted to assist with discussion of goals of care. The patient's daughter wants DNR, however her brother wants full code.  They are both the patient's next of kin.    DVT prophylaxis: Subcu Lovenox daily  Code Status: Full code-the patient's daughter wants DNR however her brother wants full code.  They are both the patient's next of kin.  Family Communication: Updated the patient's daughter at bedside.  Disposition Plan: Admitted to progressive care unit.  Consults called: Palliative care team.  Admission status: Inpatient status.   Status is: Inpatient The patient requires at least 2 midnights for further evaluation and treatment of present condition.   Kayleen Memos MD [facility] Pager [phone]  If 7PM-7AM, please contact night-coverage [email or web address] Password [email or web address]  10/17/2022, 9:10 PM

## 2022-10-17 NOTE — Telephone Encounter (Signed)
I saw the patient yesterday via televisit.  Thank you!  Salvatore Marvel, MD [facility]

## 2022-10-17 NOTE — Progress Notes (Signed)
Pharmacy Antibiotic Note  Virginia [name] is a 86 y.o. female admitted on 10/17/2022 with fever and FTT, on Augementin at home for sinusitis.  Pharmacy has been consulted for Unasyn dosing.  Plan: Unasyn 3 g IV q6h  Temp (24hrs), Avg:98.7 F (37.1 C), Min:98.7 F (37.1 C), Max:98.7 F (37.1 C)  Recent Labs  Lab 10/17/22 1720  WBC 8.9  CREATININE 0.89  LATICACIDVEN 1.0    CrCl cannot be calculated (Unknown ideal weight.).    Allergies  Allergen Reactions   Lidocaine Anaphylaxis, Swelling, Rash and Other (See Comments)    Any of the " Caine  Family "   Other Other (See Comments)    All drugs that end in "cane'-  novacane etc. Daughter states patient almost died when she had it when she was born     Caryl Pina 10/17/2022 11:13 PM

## 2022-10-18 DIAGNOSIS — Z515 Encounter for palliative care: Secondary | ICD-10-CM | POA: Diagnosis not present

## 2022-10-18 DIAGNOSIS — L8990 Pressure ulcer of unspecified site, unspecified stage: Secondary | ICD-10-CM | POA: Insufficient documentation

## 2022-10-18 DIAGNOSIS — E870 Hyperosmolality and hypernatremia: Secondary | ICD-10-CM | POA: Diagnosis not present

## 2022-10-18 LAB — CBC WITH DIFFERENTIAL/PLATELET
Abs Immature Granulocytes: 0.04 10*3/uL (ref 0.00–0.07)
Abs Immature Granulocytes: 0.05 10*3/uL (ref 0.00–0.07)
Basophils Absolute: 0 10*3/uL (ref 0.0–0.1)
Basophils Absolute: 0.1 10*3/uL (ref 0.0–0.1)
Basophils Relative: 1 %
Basophils Relative: 1 %
Eosinophils Absolute: 0 10*3/uL (ref 0.0–0.5)
Eosinophils Absolute: 0 10*3/uL (ref 0.0–0.5)
Eosinophils Relative: 0 %
Eosinophils Relative: 0 %
HCT: 42.8 % (ref 36.0–46.0)
HCT: 43.1 % (ref 36.0–46.0)
Hemoglobin: 12.3 g/dL (ref 12.0–15.0)
Hemoglobin: 12.6 g/dL (ref 12.0–15.0)
Immature Granulocytes: 0 %
Immature Granulocytes: 1 %
Lymphocytes Relative: 26 %
Lymphocytes Relative: 28 %
Lymphs Abs: 2.3 10*3/uL (ref 0.7–4.0)
Lymphs Abs: 3.8 10*3/uL (ref 0.7–4.0)
MCH: 29.7 pg (ref 26.0–34.0)
MCH: 30.6 pg (ref 26.0–34.0)
MCHC: 28.5 g/dL — ABNORMAL LOW (ref 30.0–36.0)
MCHC: 29.4 g/dL — ABNORMAL LOW (ref 30.0–36.0)
MCV: 103.9 fL — ABNORMAL HIGH (ref 80.0–100.0)
MCV: 104.1 fL — ABNORMAL HIGH (ref 80.0–100.0)
Monocytes Absolute: 0.8 10*3/uL (ref 0.1–1.0)
Monocytes Absolute: 1.6 10*3/uL — ABNORMAL HIGH (ref 0.1–1.0)
Monocytes Relative: 10 %
Monocytes Relative: 12 %
Neutro Abs: 5.5 10*3/uL (ref 1.7–7.7)
Neutro Abs: 8 10*3/uL — ABNORMAL HIGH (ref 1.7–7.7)
Neutrophils Relative %: 59 %
Neutrophils Relative %: 62 %
Platelets: 166 10*3/uL (ref 150–400)
Platelets: 243 10*3/uL (ref 150–400)
RBC: 4.12 MIL/uL (ref 3.87–5.11)
RBC: 4.14 MIL/uL (ref 3.87–5.11)
RDW: 12.7 % (ref 11.5–15.5)
RDW: 13 % (ref 11.5–15.5)
WBC: 13.5 10*3/uL — ABNORMAL HIGH (ref 4.0–10.5)
WBC: 8.8 10*3/uL (ref 4.0–10.5)
nRBC: 0 % (ref 0.0–0.2)
nRBC: 0 % (ref 0.0–0.2)

## 2022-10-18 LAB — BASIC METABOLIC PANEL
Anion gap: 12 (ref 5–15)
Anion gap: 8 (ref 5–15)
BUN: 32 mg/dL — ABNORMAL HIGH (ref 8–23)
BUN: 31 mg/dL — ABNORMAL HIGH (ref 8–23)
CO2: 25 mmol/L (ref 22–32)
CO2: 31 mmol/L (ref 22–32)
Calcium: 8.3 mg/dL — ABNORMAL LOW (ref 8.9–10.3)
Calcium: 8.7 mg/dL — ABNORMAL LOW (ref 8.9–10.3)
Chloride: 118 mmol/L — ABNORMAL HIGH (ref 98–111)
Chloride: 118 mmol/L — ABNORMAL HIGH (ref 98–111)
Creatinine, Ser: 1.14 mg/dL — ABNORMAL HIGH (ref 0.44–1.00)
Creatinine, Ser: 1.04 mg/dL — ABNORMAL HIGH (ref 0.44–1.00)
GFR, Estimated: 47 mL/min — ABNORMAL LOW (ref >60)
GFR, Estimated: 53 mL/min — ABNORMAL LOW (ref >60)
Glucose, Bld: 125 mg/dL — ABNORMAL HIGH (ref 70–99)
Glucose, Bld: 107 mg/dL — ABNORMAL HIGH (ref 70–99)
Potassium: 4.4 mmol/L (ref 3.5–5.1)
Potassium: 4.1 mmol/L (ref 3.5–5.1)
Sodium: 155 mmol/L — ABNORMAL HIGH (ref 135–145)
Sodium: 157 mmol/L — ABNORMAL HIGH (ref 135–145)

## 2022-10-18 LAB — CBG MONITORING, ED: Glucose-Capillary: 143 mg/dL — ABNORMAL HIGH (ref 70–99)

## 2022-10-18 LAB — PHOSPHORUS: Phosphorus: 4 mg/dL (ref 2.5–4.6)

## 2022-10-18 LAB — OSMOLALITY, URINE: Osmolality, Ur: 728 mOsm/kg (ref 300–900)

## 2022-10-18 LAB — MRSA NEXT GEN BY PCR, NASAL
MRSA by PCR Next Gen: NOT DETECTED
MRSA by PCR Next Gen: NOT DETECTED

## 2022-10-18 LAB — HEMOGLOBIN A1C
Hgb A1c MFr Bld: 5 % (ref 4.8–5.6)
Mean Plasma Glucose: 96.8 mg/dL

## 2022-10-18 LAB — PROCALCITONIN: Procalcitonin: <0.1 ng/mL

## 2022-10-18 LAB — GLUCOSE, CAPILLARY
Glucose-Capillary: 85 mg/dL (ref 70–99)
Glucose-Capillary: 90 mg/dL (ref 70–99)
Glucose-Capillary: 150 mg/dL — ABNORMAL HIGH (ref 70–99)

## 2022-10-18 LAB — TSH: TSH: 0.16 u[IU]/mL — ABNORMAL LOW (ref 0.350–4.500)

## 2022-10-18 LAB — MAGNESIUM: Magnesium: 2.3 mg/dL (ref 1.7–2.4)

## 2022-10-18 LAB — SODIUM, URINE, RANDOM: Sodium, Ur: 17 mmol/L

## 2022-10-18 LAB — FOLATE: Folate: 32.1 ng/mL (ref >5.9)

## 2022-10-18 LAB — AMMONIA: Ammonia: 19 umol/L (ref 9–35)

## 2022-10-18 LAB — VITAMIN B12: Vitamin B-12: 424 pg/mL (ref 180–914)

## 2022-10-18 MED ORDER — OXYBUTYNIN CHLORIDE 5 MG PO TABS: 2.5000 mg | ORAL_TABLET | Freq: Four times a day (QID) | ORAL | Status: DC | PRN

## 2022-10-18 MED ORDER — LORAZEPAM 2 MG/ML PO CONC: 1.0000 mg | ORAL | Status: DC | PRN

## 2022-10-18 MED ORDER — LORAZEPAM 1 MG PO TABS: 1.0000 mg | ORAL_TABLET | ORAL | Status: DC | PRN

## 2022-10-18 MED ORDER — IPRATROPIUM-ALBUTEROL 0.5-2.5 (3) MG/3ML IN SOLN
3.0000 mL | RESPIRATORY_TRACT | Status: DC | PRN
  Administered 2022-10-18: 3 mL via RESPIRATORY_TRACT
  Filled 2022-10-18: qty 3

## 2022-10-18 MED ORDER — METOPROLOL TARTRATE 5 MG/5ML IV SOLN: 5.0000 mg | INTRAVENOUS | Status: DC | PRN

## 2022-10-18 MED ORDER — INFLUENZA VAC A&B SA ADJ QUAD 0.5 ML IM PRSY: 0.5000 mL | PREFILLED_SYRINGE | INTRAMUSCULAR | Status: DC

## 2022-10-18 MED ORDER — HYDRALAZINE HCL 20 MG/ML IJ SOLN: 10.0000 mg | INTRAMUSCULAR | Status: DC | PRN

## 2022-10-18 MED ORDER — GLYCOPYRROLATE NICU IV SYRINGE 0.2 MG/ML: 6.0000 ug/kg | Freq: Three times a day (TID) | INTRAMUSCULAR | Status: DC | PRN

## 2022-10-18 MED ORDER — SENNOSIDES-DOCUSATE SODIUM 8.6-50 MG PO TABS: 1.0000 | ORAL_TABLET | Freq: Every evening | ORAL | Status: DC | PRN

## 2022-10-18 MED ORDER — MORPHINE SULFATE (CONCENTRATE) 10 MG/0.5ML PO SOLN: 5.0000 mg | ORAL | Status: DC | PRN

## 2022-10-18 MED ORDER — NYSTATIN 100000 UNIT/GM EX POWD: Freq: Three times a day (TID) | CUTANEOUS | Status: DC | PRN

## 2022-10-18 MED ORDER — KETOROLAC TROMETHAMINE 15 MG/ML IJ SOLN
15.0000 mg | Freq: Four times a day (QID) | INTRAMUSCULAR | Status: DC
  Administered 2022-10-18 – 2022-10-20 (×7): 15 mg via INTRAVENOUS
  Filled 2022-10-18 (×8): qty 1

## 2022-10-18 MED ORDER — DEXTROSE 5 % IV SOLN: INTRAVENOUS | Status: DC

## 2022-10-18 MED ORDER — DEXTROSE 5 % IV BOLUS
500.0000 mL | Freq: Once | INTRAVENOUS | Status: AC
  Administered 2022-10-18: 500 mL via INTRAVENOUS

## 2022-10-18 MED ORDER — ACETAMINOPHEN 325 MG PO TABS
650.0000 mg | ORAL_TABLET | Freq: Four times a day (QID) | ORAL | Status: DC | PRN
  Filled 2022-10-18: qty 2

## 2022-10-18 MED ORDER — BISACODYL 10 MG RE SUPP
10.0000 mg | Freq: Once | RECTAL | Status: AC
  Administered 2022-10-18: 10 mg via RECTAL
  Filled 2022-10-18: qty 1

## 2022-10-18 MED ORDER — PNEUMOCOCCAL 20-VAL CONJ VACC 0.5 ML IM SUSY: 0.5000 mL | PREFILLED_SYRINGE | INTRAMUSCULAR | Status: DC

## 2022-10-18 MED ORDER — TRAZODONE HCL 50 MG PO TABS: 50.0000 mg | ORAL_TABLET | Freq: Every evening | ORAL | Status: DC | PRN

## 2022-10-18 MED ORDER — GLYCOPYRROLATE 1 MG PO TABS
2.0000 mg | ORAL_TABLET | Freq: Three times a day (TID) | ORAL | Status: DC
  Filled 2022-10-18 (×7): qty 2

## 2022-10-18 MED ORDER — POLYVINYL ALCOHOL 1.4 % OP SOLN: 1.0000 [drp] | Freq: Four times a day (QID) | OPHTHALMIC | Status: DC | PRN

## 2022-10-18 MED ORDER — ONDANSETRON HCL 4 MG/2ML IJ SOLN: 4.0000 mg | Freq: Four times a day (QID) | INTRAMUSCULAR | Status: DC | PRN

## 2022-10-18 MED ORDER — LORAZEPAM 2 MG/ML IJ SOLN
1.0000 mg | INTRAMUSCULAR | Status: DC | PRN
  Administered 2022-10-19: 1 mg via INTRAVENOUS
  Filled 2022-10-18: qty 1

## 2022-10-18 MED ORDER — OXYCODONE HCL 5 MG PO TABS: 5.0000 mg | ORAL_TABLET | ORAL | Status: DC | PRN

## 2022-10-18 MED ORDER — MAGIC MOUTHWASH: 15.0000 mL | Freq: Four times a day (QID) | ORAL | Status: DC | PRN

## 2022-10-18 MED ORDER — GLYCOPYRROLATE 0.2 MG/ML IJ SOLN
0.2000 mg | INTRAMUSCULAR | Status: DC | PRN
  Administered 2022-10-18 – 2022-10-20 (×7): 0.2 mg via INTRAVENOUS
  Filled 2022-10-18 (×7): qty 1

## 2022-10-18 MED ORDER — FLEET ENEMA 7-19 GM/118ML RE ENEM: 1.0000 | ENEMA | Freq: Every day | RECTAL | Status: DC | PRN

## 2022-10-18 NOTE — Progress Notes (Signed)
   10/18/22 0526  Assess: MEWS Score  Temp 99.3 F (37.4 C)  ECG Heart Rate 99  Level of Consciousness Responds to Pain  O2 Device Nasal Cannula  O2 Flow Rate (L/min) 2 L/min  Assess: MEWS Score  MEWS Temp 0  MEWS Systolic 0  MEWS Pulse 0  MEWS RR 1  MEWS LOC 2  MEWS Score 3  MEWS Score Color Yellow  Assess: if the MEWS score is Yellow or Red  Were vital signs taken at a resting state? Yes  Focused Assessment No change from prior assessment  Does the patient meet 2 or more of the SIRS criteria? Yes  Does the patient have a confirmed or suspected source of infection? Yes  Provider and Rapid Response Notified? No  MEWS guidelines implemented *See Row Information* Yes  Treat  MEWS Interventions Administered scheduled meds/treatments  Pain Scale PAINAD  Pain Score 0  Take Vital Signs  Increase Vital Sign Frequency  Yellow: Q 2hr X 2 then Q 4hr X 2, if remains yellow, continue Q 4hrs  Escalate  MEWS: Escalate Yellow: discuss with charge nurse/RN and consider discussing with provider and RRT  Notify: Charge Nurse/RN  Name of Charge Nurse/RN Notified Stephanie, RN  Date Charge Nurse/RN Notified 10/18/22  Time Charge Nurse/RN Notified 0536  Document  Patient Outcome Stabilized after interventions  Progress note created (see row info) Yes  Assess: SIRS CRITERIA  SIRS Temperature  0  SIRS Pulse 1  SIRS Respirations  0  SIRS WBC 1  SIRS Score Sum  2

## 2022-10-18 NOTE — Consult Note (Signed)
Palliative Medicine Inpatient Consult Note  Consulting Provider:  Kayleen Memos, DO   Reason for consult:   [place] Palliative Medicine Consult  Reason for Consult? To assist with goals of care discussion.   10/18/2022  HPI:  Per intake H&P --> : Virginia [name] is a 86 y.o. female with medical history significant for dementia, hypertension, chronic hypoxia on 2 L nasal cannula continuously, who presented to [facility] ED from home via EMS due to failure to thrive, poor oral intake and lethargy.  The history is obtained from the patient's daughter at bedside and also from [place] and review of medical records.  The patient has had a significant decrease in appetite and oral intake for the past 2 days.  Associated with subjective fevers.  Prior to presentation she had a fever with a Tmax of 100.4.  Recently diagnosed with sinusitis, she was prescribed antibiotics, Augmentin, and steroids, prednisone.   Palliative care saw Virginia [name] in March of this year at which time hospice was discussed though family was not ready for this emphasis of care.   Clinical Assessment/Goals of Care:  *Please note that this is a verbal dictation therefore any spelling or grammatical errors are due to the "[place]" system interpretation.  I have reviewed medical records including EPIC notes, labs and imaging, received report from bedside RN, assessed the patient.    I met with [name]'s daughter, Virginia [name] to further discuss diagnosis prognosis, GOC, EOL wishes, disposition and options.   I introduced Palliative Medicine as specialized medical care for people living with serious illness. It focuses on providing relief from the symptoms and stress of a serious illness. The goal is to improve quality of life for both the patient and the family.  Medical History Review and Understanding:  I discussed with patient's daughter Virginia [name] Virginia [name]'s history of dementia, hypertension, hypoxia, failure to  thrive.  Social History:  [name] lives in [place], [place]. She was married though is divorced. 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 She has a brother and a sister, as well as a son and a daughter. Currently lives with her brother and son. She was enjoying TV, her stuffed animals, and visits from her friends until her rapid decline this past weekend. Virginia [name] has been reliant on her family for care for about six (+) years.   Functional and Nutritional State:  Bedbound since her hip fracture in 2022. Appetite is very poor overall.    Advance Directives:  A detailed discussion was had today regarding advanced directives.  Patient's daughter shares both she and her brother have the healthcare power of attorney authority.  I have requested these documents for clarity purposes.  Code Status:  Concepts specific to code status, artifical feeding and hydration, continued IV antibiotics and rehospitalization was had.  The difference between a aggressive medical intervention path  and a palliative comfort care path for this patient at this time was had.   Encouraged patient/family to consider DNR/DNI status understanding evidenced based poor outcomes in similar hospitalized patient, as the cause of arrest is likely associated with advanced chronic/terminal illness rather than an easily reversible acute cardio-pulmonary event. I explained that DNR/DNI does not change the medical plan and it only comes into effect after a person has arrested (died).  It is a protective measure to keep [place] from harming the patient in their last moments of life.   Patient's daughter Virginia [name]  is in agreement with DNR though she shares her brother has had a more difficult time with the thought of this  Discussion:  A discussion in terms of the circumstances leading up to hospitalization was had.  We reviewed that Virginia [name] started to have more trouble on  Wednesday of this week and had an episode of difficulty breathing and "foaming at the mouth".  Discussed that at that time the help of outpatient services through [facility] [place] palliative arm were enacted.  Per daughter the nurse, Tammy had recommended patient be made a DO NOT RESUSCITATE and offered the possibility of transitioning to hospice in house.  Per patient's daughter this was refuted by her brother and the decision to continue care in the home was held.  On Friday, yesterday another acute event occurred where patient was quite altered.  [facility] came out again and described the different paths which could be taken 1 being going to the hospital for acute medical care and the other being staying at home and transitioning to hospice.  Per Virginia [name] patient's brother had wanted her to go to the hospital so they could rule out and/or treat any infectious process which was occurring.  Thus leading to hospitalization.  We reviewed the trajectory of Alzheimer's dementia and how patient appears to be at the end stage as evident by her hypernatremia, incontinence, and bedbound deadness.  I shared that we can certainly give her fluids and antibiotics but this is not going to fix her underlying disease process.  Daughter understood this and shared that she would ideally like her mother to be comfortable and not suffer.  We discussed that I would call Virginia [name] son for further conversations.  Discussed the importance of continued conversation with family and their  medical providers regarding overall plan of care and treatment options, ensuring decisions are within the context of the patients values and GOCs. ___________________________________________ Addendum:  I spoke to [facility] and per my conversation with RN, [place] it has been a complicated home situation and there is been some animosity among siblings.  [facility] has done their due diligence to try to  help guide patient care and explained to patient's family that [name] is at the end stage of her disease process. ____________________________________________ Addendum 2:  I spoke to patient's son, [name] over the phone.  We discussed Luna's present health state and he vocalizes that he has been keeping up-to-date on MyChart.  I again shared that my greatest concern is we are at the end stage of Wynonna's life and I strongly recommended consideration of DO NOT RESUSCITATE/DO NOT INTUBATE CODE STATUS as well as considering that she be enrolled in hospice care.  [name] is not disagreeing with this though he would like to hear from the primary doctor regarding their thoughts.  I shared that I would asked Dr. Reesa Chew to call him and explained to him from his perspective what he feels is going on with Lucelia's health.  Decision Maker: Patient's daughter Virginia [name] and son [name] her primary decision makers.  SUMMARY OF RECOMMENDATIONS   Full code for the time being --> patient's daughter is agreeable to a DNR though patient's son [name] would like to speak to the primary medical provider, Dr. Reesa Chew before agreeing to this  Open and honest conversations were held about patient's disease process and we are we are at in terms of severity of illness  Recommended consideration of hospice care given patient's overall declining  state  Ongoing palliative care support  Code Status/Advance Care Planning: FULL CODE  Palliative Prophylaxis:  Aspiration, Bowel Regimen, Delirium Protocol, Frequent Pain Assessment, Oral Care, Palliative Wound Care, and Turn Reposition  Additional Recommendations (Limitations, Scope, Preferences): Continue current care  Psycho-social/Spiritual:  Desire for further Chaplaincy support: Not presently Additional Recommendations: Education on end stage dementia   Prognosis: Limited days to weeks at the most given adult FTT, end stage AD  Discharge Planning: Discharge plan is not yet  certain.  Vitals:   10/18/22 0526 10/18/22 0600  BP:  (!) 149/53  Pulse:  98  Resp: 16 16  Temp: 99.3 F (37.4 C) 98.5 F (36.9 C)  SpO2:  100%    Intake/Output Summary (Last 24 hours) at 10/18/2022 0708 Last data filed at 10/17/2022 2347 Gross per 24 hour  Intake 999 ml  Output --  Net 999 ml   Gen: Extremely frail ill-appearing elderly Caucasian female HEENT: moist mucous membranes CV: Irregular rate and rhythm  PULM: ON 2LPM breathing even and nonlabored ABD: soft/nontender EXT: Cool extremities Neuro: Somnolent  PPS: 10%   This conversation/these recommendations were discussed with patient primary care team, Dr. Reesa Chew  Total Time: 105  Billing based on MDM: High  Problems Addressed: One acute or chronic illness or injury that poses a threat to life or bodily function  Amount and/or Complexity of Data: Category 3:Discussion of management or test interpretation with external physician/other qualified health care professional/appropriate source (not separately reported)  Risks: Decision regarding hospitalization or escalation of hospital care and Decision not to resuscitate or to de-escalate care because of poor prognosis ______________________________________________________ [place] Team Team Cell Phone: [phone] Please utilize secure chat with additional questions, if there is no response within 30 minutes please call the above phone number  Palliative Medicine Team providers are available by phone from 7am to 7pm daily and can be reached through the team cell phone.  Should this patient require assistance outside of these hours, please call the patient's attending physician.

## 2022-10-18 NOTE — Progress Notes (Signed)
Pt arrived from ED via stretcher by ED Nurse. Pt placed on tele, ozygen 2l/[place] and VS taken. Pt remains lethargic, nonverbal, and moves no extremities. Skin assessment completed. Bruised on left handed and nonblanchable redness present on admission. Foam applied to sacrum. Daughter at bedside, oriented to room and call bell and pt's plan of care.

## 2022-10-18 NOTE — Care Management (Addendum)
  Transition of Care [facility]) Screening Note   Patient Details  Name: Virginia [name] Date of Birth: 1936-09-20   Transition of Care [facility]) CM/SW Contact:    Bethena Roys, RN Phone Number: 10/18/2022, 1:27 PM    Transition of Care Department [facility]) has reviewed the patient. Case Manager received a secure chat to offer home hospice. Palliative Nurse Practitioner states family has outpatient palliative services with [facility] and the family wants Home Hospice with the above agency. Case Manager called [facility] and she has to speak with her manager on Monday to see if they can accept the patient for services due to past family dynamics-Manager will have to make the final decision. Case Manager will continue to monitor patient advancement through interdisciplinary progression rounds. If new patient transition needs arise, please place a TOC consult.   1458 10-18-22 Medicare.gov list for Hospice Agency Choice provided to daughter [name] and son [name]. Family to discuss agency list and make Case Manager aware of choice once they have an agreement. Patient has all DME in the home.

## 2022-10-18 NOTE — ED Notes (Signed)
0.5 mg Dilaudid wasted and witnessed by Colin Rhein, Therapist, sports

## 2022-10-18 NOTE — Progress Notes (Signed)
PROGRESS NOTE    Virginia [name]  ZOX:[id number] DOB: 13-Sep-1936 DOA: 10/17/2022 PCP: Clovia Cuff, MD   Brief Narrative:  86 y.o. female with medical history significant for dementia, hypertension, chronic hypoxia on 2 L nasal cannula continuously, who presented to [facility] ED from home via EMS due to failure to thrive, poor oral intake and lethargy.  The history is obtained from the patient's daughter at bedside and also from [place] and review of medical records.  The patient has had a significant decrease in appetite and oral intake for the past 2 days.  Associated with subjective fevers.  Prior to presentation she had a fever with a Tmax of 100.4.  Recently diagnosed with sinusitis, she was prescribed antibiotics, Augmentin, and steroids, prednisone. In the ED, lethargic, hypovolemic and hypernatremic with serum sodium of 152.  UA negative for pyuria.  No clear evidence of pneumonia on chest x-ray.  Tachycardic.  She was started on IV fluid hydration.  TRH, hospitalist service was asked to admit. Palliative care consulted for [place].    Assessment & Plan:  Principal Problem:   Hypernatremia      Hypovolemic hypernatremia, likely secondary to dehydration from poor oral intake Family would like to cont IVF while she is here, and so will check daily BMP instead of q6hrs Na.  Diet as tolerated.    Acute metabolic encephalopathy in the setting of hypernatremia Baseline dementia, nonverbal, bedbound.  Overall appears to be at baseline.  TSH low, Ammonia, B12 are normal.     Recently diagnosed sinusitis Fevers Currently on Unasyn, can continue this.  -UA negative, flu, COVID-negative.  Follow-up culture data   Goals of care Seen by palliative care.  I also had extensive discussion with the patient's daughter and son who are both POA's.  They are agreeable to transition patient to comfort care.  In the meantime they will work with hospice team to make arrangements at home over next couple of  days.  Overall patient has very poor prognosis  DVT prophylaxis: Subcu Lovenox Code Status: DNR Family Communication:    Hopefully home in next couple of days once all the arrangements have been made  Subjective: Seen and examined at bedside, patient is resting comfortably.  Daughter is present at bedside and also spoke with patient's son over the phone while I was in the room.  Both [name] and [name] agree that patient should be transition to comfort care and are willing to speak with hospice to eventually transition her home with hospice.  They also agree that we will not be escalating her care and continue IV antibiotics and IV fluid while she is here but they will be discontinued upon discharge.  We will also proceed with comfort feeding.   Examination:  General exam: Sleeping, no meaningful interaction Respiratory system: Some rhonchi anteriorly Cardiovascular system: Sinus tachycardia Gastrointestinal system: Abdomen is nondistended, soft and nontender. No organomegaly or masses felt. Normal bowel sounds heard. Central nervous system: Unable to assess Extremities: No obvious deformities Skin: No rashes, lesions or ulcers Psychiatry: J unable to assess    Objective: Vitals:   10/18/22 0526 10/18/22 0600 10/18/22 0723 10/18/22 0754  BP: (!) 146/63 (!) 149/53 (!) 121/57 (!) 127/45  Pulse:  98 (!) 114 (!) 103  Resp: '16 16 13 14  '$ Temp: 99.3 F (37.4 C) 98.5 F (36.9 C) (!) 100.6 F (38.1 C) (!) 100.6 F (38.1 C)  TempSrc: Axillary  Axillary Axillary  SpO2: 100% 100% 97% 98%  Intake/Output Summary (Last 24 hours) at 10/18/2022 0800 Last data filed at 10/17/2022 2347 Gross per 24 hour  Intake 999 ml  Output --  Net 999 ml   There were no vitals filed for this visit.   Data Reviewed:   CBC: Recent Labs  Lab 10/17/22 1720 10/18/22 0028 10/18/22 0703  WBC 8.9 8.8 13.5*  NEUTROABS 7.4 5.5 8.0*  HGB 13.8 12.6 12.3  HCT 47.4* 42.8 43.1  MCV 102.2* 103.9*  104.1*  PLT 200 166 829   Basic Metabolic Panel: Recent Labs  Lab 10/17/22 1720 10/18/22 0028 10/18/22 0703  NA 152* 155*  --   K 4.2 4.4  --   CL 113* 118*  --   CO2 31 25  --   GLUCOSE 150* 125*  --   BUN 35* 32*  --   CREATININE 0.89 1.14*  --   CALCIUM 9.5 8.3*  --   MG  --   --  2.3   GFR: CrCl cannot be calculated (Unknown ideal weight.). Liver Function Tests: Recent Labs  Lab 10/17/22 1720  AST 20  ALT 13  ALKPHOS 47  BILITOT 1.0  PROT 6.4*  ALBUMIN 3.0*   No results for input(s): "LIPASE", "AMYLASE" in the last 168 hours. No results for input(s): "AMMONIA" in the last 168 hours. Coagulation Profile: Recent Labs  Lab 10/17/22 1720  INR 1.1   Cardiac Enzymes: No results for input(s): "CKTOTAL", "CKMB", "CKMBINDEX", "TROPONINI" in the last 168 hours. BNP (last 3 results) No results for input(s): "PROBNP" in the last 8760 hours. HbA1C: No results for input(s): "HGBA1C" in the last 72 hours. CBG: Recent Labs  Lab 10/18/22 0002 10/18/22 0525 10/18/22 0721  GLUCAP 143* 85 90   Lipid Profile: No results for input(s): "CHOL", "HDL", "LDLCALC", "TRIG", "CHOLHDL", "LDLDIRECT" in the last 72 hours. Thyroid Function Tests: Recent Labs    10/18/22 0028  TSH 0.160*   Anemia Panel: No results for input(s): "VITAMINB12", "FOLATE", "FERRITIN", "TIBC", "IRON", "RETICCTPCT" in the last 72 hours. Sepsis Labs: Recent Labs  Lab 10/17/22 1720  LATICACIDVEN 1.0    Recent Results (from the past 240 hour(s))  Resp Panel by RT-PCR (Flu A&B, Covid) Anterior Nasal Swab     Status: None   Collection Time: 10/17/22  4:32 PM   Specimen: Anterior Nasal Swab  Result Value Ref Range Status   SARS Coronavirus 2 by RT PCR NEGATIVE NEGATIVE Final    Comment: (NOTE) SARS-CoV-2 target nucleic acids are NOT DETECTED.  The SARS-CoV-2 RNA is generally detectable in upper respiratory specimens during the acute phase of infection. The lowest concentration of SARS-CoV-2  viral copies this assay can detect is 138 copies/mL. A negative result does not preclude SARS-Cov-2 infection and should not be used as the sole basis for treatment or other patient management decisions. A negative result may occur with  improper specimen collection/handling, submission of specimen other than nasopharyngeal swab, presence of viral mutation(s) within the areas targeted by this assay, and inadequate number of viral copies(<138 copies/mL). A negative result must be combined with clinical observations, patient history, and epidemiological information. The expected result is Negative.  Fact Sheet for Patients:  EntrepreneurPulse.com.au  Fact Sheet for Healthcare Providers:  IncredibleEmployment.be  This test is no t yet approved or cleared by the [place] FDA and  has been authorized for detection and/or diagnosis of SARS-CoV-2 by FDA under an Emergency Use Authorization (EUA). This EUA will remain  in effect (meaning this test can be used)  for the duration of the COVID-19 declaration under Section 564(b)(1) of the Act, 21 U.S.C.section 360bbb-3(b)(1), unless the authorization is terminated  or revoked sooner.       Influenza A by PCR NEGATIVE NEGATIVE Final   Influenza B by PCR NEGATIVE NEGATIVE Final    Comment: (NOTE) The Xpert Xpress SARS-CoV-2/FLU/RSV plus assay is intended as an aid in the diagnosis of influenza from Nasopharyngeal swab specimens and should not be used as a sole basis for treatment. Nasal washings and aspirates are unacceptable for Xpert Xpress SARS-CoV-2/FLU/RSV testing.  Fact Sheet for Patients: EntrepreneurPulse.com.au  Fact Sheet for Healthcare Providers: IncredibleEmployment.be  This test is not yet approved or cleared by the [place] FDA and has been authorized for detection and/or diagnosis of SARS-CoV-2 by FDA under an Emergency Use Authorization  (EUA). This EUA will remain in effect (meaning this test can be used) for the duration of the COVID-19 declaration under Section 564(b)(1) of the Act, 21 U.S.C. section 360bbb-3(b)(1), unless the authorization is terminated or revoked.  Performed at [facility], [place] [address], [place], [place] [address]   Blood Culture (routine x 2)     Status: None (Preliminary result)   Collection Time: 10/17/22  5:20 PM   Specimen: BLOOD RIGHT WRIST  Result Value Ref Range Status   Specimen Description BLOOD RIGHT WRIST  Final   Special Requests   Final    BOTTLES DRAWN AEROBIC AND ANAEROBIC Blood Culture adequate volume   Culture   Final    NO GROWTH < 12 HOURS Performed at [facility], [place] [address], [place], [place] [address]    Report Status PENDING  Incomplete  MRSA Next Gen by PCR, Nasal     Status: None   Collection Time: 10/18/22 12:18 AM   Specimen: Nasal Mucosa; Nasal Swab  Result Value Ref Range Status   MRSA by PCR Next Gen NOT DETECTED NOT DETECTED Final    Comment: (NOTE) The GeneXpert MRSA Assay (FDA approved for NASAL specimens only), is one component of a comprehensive MRSA colonization surveillance program. It is not intended to diagnose MRSA infection nor to guide or monitor treatment for MRSA infections. Test performance is not FDA approved in patients less than 19 years old. Performed at [facility], [place] [address] [place], [place] [address]   MRSA Next Gen by PCR, Nasal     Status: None   Collection Time: 10/18/22  5:33 AM   Specimen: Nasal Mucosa; Nasal Swab  Result Value Ref Range Status   MRSA by PCR Next Gen NOT DETECTED NOT DETECTED Final    Comment: (NOTE) The GeneXpert MRSA Assay (FDA approved for NASAL specimens only), is one component of a comprehensive MRSA colonization surveillance program. It is not intended to diagnose MRSA infection nor to guide or monitor treatment for MRSA infections. Test performance is not FDA approved in  patients less than 25 years old. Performed at [facility], [place] [address], [place], [place] [address]          Radiology Studies: DG Chest Port 1 View  Result Date: 10/17/2022 CLINICAL DATA:  Questionable sepsis.  Consideration for aspiration. EXAM: PORTABLE CHEST 1 VIEW COMPARISON:  January 27, 2022. FINDINGS: EKG leads project over the chest. Image rotated to the LEFT, accounting for this cardiomediastinal contours and hilar structures are stable. Lungs are clear.  No pneumothorax. On limited assessment no acute skeletal findings. IMPRESSION: No acute cardiopulmonary disease. Electronically Signed  By: Zetta Bills M.D.   On: 10/17/2022 16:44        Scheduled Meds:  enoxaparin (LOVENOX) injection  40 mg Subcutaneous Q24H   [START ON 10/19/2022] influenza vaccine adjuvanted  0.5 mL Intramuscular Tomorrow-1000   insulin aspart  0-9 Units Subcutaneous Q4H   [START ON 10/19/2022] pneumococcal 20-valent conjugate vaccine  0.5 mL Intramuscular Tomorrow-1000   Continuous Infusions:  ampicillin-sulbactam (UNASYN) IV 3 g (10/18/22 0097)   dextrose     dextrose       LOS: 1 day   Time spent= 35 mins    Daje Stark Arsenio Loader, MD [facility]  If 7PM-7AM, please contact night-coverage  10/18/2022, 8:00 AM

## 2022-10-18 NOTE — Progress Notes (Signed)
   Palliative Medicine Inpatient Follow Up Note   Per Dr. Reesa Chew - he called patients son [name] and spoke with daughter at the same time. They have agreed to DNAR/DNI and for patients focus to be that of comfort.   I was called to bedside this later afternoon by patients daughter, [name]. We discussed patients present state. Coordinated calling patients son, [name] on speaker-phone as [name] concerned that patient may pass away this weekend and wanted to attempt an expedited discharge. Reviewed that [facility] cannot review chart until Monday. Family determined that they would call them. They have agreed to be cordial to one another in an effort to get [address] home for her end of life process.   Reviewed that [facility] will not be able to support patient with home hospice until upper management approves.   Coordination with TOC Hassan Rowan who provided a list of [employer].   After much conversation, patients family agree to keeping her here until Monday and seeing what [facility] feels at that time. They are hopeful to be accepted back home with with [facility].   Total Coordination time: 48 minutes  SUMMARY OF RECOMMENDATIONS   DNAR/DNI   Modified comfort care with continuation of mIVF and antibiotics until Monday  Await [facility] review of patients case for possible release home on Monday with their services if approved   Ongoing palliative care support  ______________________________________________________________________________________ [place] Team Team Cell Phone: [phone] Please utilize secure chat with additional questions, if there is no response within 30 minutes please call the above phone number  Palliative Medicine Team providers are available by phone from 7am to 7pm daily and can be reached through the team cell phone.  Should this patient require assistance outside of these hours, please call the patient's attending physician.

## 2022-10-19 ENCOUNTER — Encounter: Payer: Self-pay | Admitting: Allergy & Immunology

## 2022-10-19 DIAGNOSIS — E870 Hyperosmolality and hypernatremia: Secondary | ICD-10-CM | POA: Diagnosis not present

## 2022-10-19 DIAGNOSIS — J449 Chronic obstructive pulmonary disease, unspecified: Secondary | ICD-10-CM | POA: Diagnosis not present

## 2022-10-19 DIAGNOSIS — Z515 Encounter for palliative care: Secondary | ICD-10-CM | POA: Diagnosis not present

## 2022-10-19 DIAGNOSIS — Z9981 Dependence on supplemental oxygen: Secondary | ICD-10-CM | POA: Diagnosis not present

## 2022-10-19 DIAGNOSIS — Z7189 Other specified counseling: Secondary | ICD-10-CM | POA: Diagnosis not present

## 2022-10-19 LAB — BASIC METABOLIC PANEL
Anion gap: 8 (ref 5–15)
BUN: 26 mg/dL — ABNORMAL HIGH (ref 8–23)
CO2: 27 mmol/L (ref 22–32)
Calcium: 8.5 mg/dL — ABNORMAL LOW (ref 8.9–10.3)
Chloride: 116 mmol/L — ABNORMAL HIGH (ref 98–111)
Creatinine, Ser: 0.76 mg/dL (ref 0.44–1.00)
GFR, Estimated: >60 mL/min (ref >60)
Glucose, Bld: 104 mg/dL — ABNORMAL HIGH (ref 70–99)
Potassium: 3.8 mmol/L (ref 3.5–5.1)
Sodium: 151 mmol/L — ABNORMAL HIGH (ref 135–145)

## 2022-10-19 MED ORDER — SCOPOLAMINE 1 MG/3DAYS TD PT72
1.0000 | MEDICATED_PATCH | TRANSDERMAL | Status: DC
  Administered 2022-10-19: 1.5 mg via TRANSDERMAL
  Filled 2022-10-19: qty 1

## 2022-10-19 NOTE — Progress Notes (Signed)
PROGRESS NOTE    Virginia [name]  ZOX:[id number] DOB: [birth year] DOA: 10/17/2022 PCP: Clovia Cuff, MD   Brief Narrative:  86 y.o. female with medical history significant for dementia, hypertension, chronic hypoxia on 2 L nasal cannula continuously, who presented to [facility] ED from home via EMS due to failure to thrive, poor oral intake and lethargy.  The history is obtained from the patient's daughter at bedside and also from [place] and review of medical records.  The patient has had a significant decrease in appetite and oral intake for the past 2 days.  Associated with subjective fevers.  Prior to presentation she had a fever with a Tmax of 100.4.  Recently diagnosed with sinusitis, she was prescribed antibiotics, Augmentin, and steroids, prednisone. In the ED, lethargic, hypovolemic and hypernatremic with serum sodium of 152.  UA negative for pyuria.  No clear evidence of pneumonia on chest x-ray.  Tachycardic.  She was started on IV fluid hydration.  TRH, hospitalist service was asked to admit. Palliative care consulted for [place].  Patient is now comfort care after discussion with family. [place] with IVF and Abx, but no escalation of care.    Assessment & Plan:  Principal Problem:   Hypernatremia Active Problems:   Pressure injury of skin      Hypovolemic hypernatremia, likely secondary to dehydration from poor oral intake Continue IV fluids, normal daily labs.   Acute metabolic encephalopathy in the setting of hypernatremia Baseline dementia, nonverbal, bedbound.  Overall appears to be at baseline.  TSH low, Ammonia, B12 are normal.     Recently diagnosed sinusitis Fevers Currently on Unasyn, can continue this while there.  -UA negative, flu, COVID-negative.     Goals of care Seen by palliative care.  On 11/18 I also had extensive discussion with the patient's daughter and son who are both POA's.  They are agreeable to transition patient to comfort care.  In the meantime they will work  with hospice team to make arrangements at home over next couple of days.  Overall patient has very poor prognosis  DVT prophylaxis: Subcu Lovenox Code Status: DNR/DNI Family Communication: Met with family at bedside  Hopefully home in next couple of days once all the arrangements have been made. Tentatively planned for Monday 11/20  Subjective: No complaints, no acute events overnight.Met with the daughter and patient is cousin at bedside  Examination: Constitutional: Not in acute distress, pupils are equal and reactive Respiratory: Clear to auscultation bilaterally Cardiovascular: Normal sinus rhythm, no rubs Abdomen: Nontender nondistended good bowel sounds Musculoskeletal: No edema noted Skin: No rashes seen Neurologic: Remains in nonverbal Psychiatric: Unable to assess     Objective: Vitals:   10/18/22 1300 10/18/22 1400 10/18/22 1500 10/18/22 1941  BP: 138/76 130/64 (!) 112/39 (!) 116/40  Pulse: 97 95 (!) 51 87  Resp: _0 Temp:    99.3 F (37.4 C)  TempSrc:    Axillary  SpO2: 100% 100% 100% 100%  Weight:    53.2 kg  Height:    _1  (1.575 m)    Intake/Output Summary (Last 24 hours) at 10/19/2022 0353 Last data filed at 10/19/2022 0158 Gross per 24 hour  Intake 1437.26 ml  Output --  Net 1437.26 ml   Filed Weights   10/18/22 1941  Weight: 53.2 kg     Data Reviewed:   CBC: Recent Labs  Lab 10/17/22 1720 10/18/22 0028 10/18/22 0703  WBC 8.9 8.8 13.5*  NEUTROABS 7.4 5.5 8.0*  HGB 13.8 12.6 12.3  HCT 47.4* 42.8 43.1  MCV 102.2* 103.9* 104.1*  PLT 200 166 086   Basic Metabolic Panel: Recent Labs  Lab 10/17/22 1720 10/18/22 0028 10/18/22 0703 10/19/22 0106  NA 152* 155* 157* 151*  K 4.2 4.4 4.1 3.8  CL 113* 118* 118* 116*  CO2 _0 GLUCOSE 150* 125* 107* 104*  BUN 35* 32* 31* 26*  CREATININE 0.89 1.14* 1.04* 0.76  CALCIUM 9.5 8.3* 8.7* 8.5*  MG  --   --  2.3  --   PHOS  --   --  4.0  --    GFR: Estimated Creatinine  Clearance: 41.4 mL/min (by C-G formula based on SCr of 0.76 mg/dL). Liver Function Tests: Recent Labs  Lab 10/17/22 1720  AST 20  ALT 13  ALKPHOS 47  BILITOT 1.0  PROT 6.4*  ALBUMIN 3.0*   No results for input(s): "LIPASE", "AMYLASE" in the last 168 hours. Recent Labs  Lab 10/18/22 0703  AMMONIA 19   Coagulation Profile: Recent Labs  Lab 10/17/22 1720  INR 1.1   Cardiac Enzymes: No results for input(s): "CKTOTAL", "CKMB", "CKMBINDEX", "TROPONINI" in the last 168 hours. BNP (last 3 results) No results for input(s): "PROBNP" in the last 8760 hours. HbA1C: Recent Labs    10/18/22 0703  HGBA1C 5.0   CBG: Recent Labs  Lab 10/18/22 0002 10/18/22 0525 10/18/22 0721 10/18/22 1159  GLUCAP 143* 85 90 150*   Lipid Profile: No results for input(s): "CHOL", "HDL", "LDLCALC", "TRIG", "CHOLHDL", "LDLDIRECT" in the last 72 hours. Thyroid Function Tests: Recent Labs    10/18/22 0028  TSH 0.160*   Anemia Panel: Recent Labs    10/18/22 0703 10/18/22 0739  VITAMINB12  --  424  FOLATE 32.1  --    Sepsis Labs: Recent Labs  Lab 10/17/22 1720 10/18/22 0703  PROCALCITON  --  <0.10  LATICACIDVEN 1.0  --     Recent Results (from the past 240 hour(s))  Resp Panel by RT-PCR (Flu A&B, Covid) Anterior Nasal Swab     Status: None   Collection Time: 10/17/22  4:32 PM   Specimen: Anterior Nasal Swab  Result Value Ref Range Status   SARS Coronavirus 2 by RT PCR NEGATIVE NEGATIVE Final    Comment: (NOTE) SARS-CoV-2 target nucleic acids are NOT DETECTED.  The SARS-CoV-2 RNA is generally detectable in upper respiratory specimens during the acute phase of infection. The lowest concentration of SARS-CoV-2 viral copies this assay can detect is 138 copies/mL. A negative result does not preclude SARS-Cov-2 infection and should not be used as the sole basis for treatment or other patient management decisions. A negative result may occur with  improper specimen  collection/handling, submission of specimen other than nasopharyngeal swab, presence of viral mutation(s) within the areas targeted by this assay, and inadequate number of viral copies(<138 copies/mL). A negative result must be combined with clinical observations, patient history, and epidemiological information. The expected result is Negative.  Fact Sheet for Patients:  EntrepreneurPulse.com.au  Fact Sheet for Healthcare Providers:  IncredibleEmployment.be  This test is no t yet approved or cleared by the [place] FDA and  has been authorized for detection and/or diagnosis of SARS-CoV-2 by FDA under an Emergency Use Authorization (EUA). This EUA will remain  in effect (meaning this test can be used) for the duration of the COVID-19 declaration under Section 564(b)(1) of the Act, 21 U.S.C.section 360bbb-3(b)(1), unless the authorization is terminated  or revoked sooner.  Influenza A by PCR NEGATIVE NEGATIVE Final   Influenza B by PCR NEGATIVE NEGATIVE Final    Comment: (NOTE) The Xpert Xpress SARS-CoV-2/FLU/RSV plus assay is intended as an aid in the diagnosis of influenza from Nasopharyngeal swab specimens and should not be used as a sole basis for treatment. Nasal washings and aspirates are unacceptable for Xpert Xpress SARS-CoV-2/FLU/RSV testing.  Fact Sheet for Patients: EntrepreneurPulse.com.au  Fact Sheet for Healthcare Providers: IncredibleEmployment.be  This test is not yet approved or cleared by the [place] FDA and has been authorized for detection and/or diagnosis of SARS-CoV-2 by FDA under an Emergency Use Authorization (EUA). This EUA will remain in effect (meaning this test can be used) for the duration of the COVID-19 declaration under Section 564(b)(1) of the Act, 21 U.S.C. section 360bbb-3(b)(1), unless the authorization is terminated or revoked.  Performed at [facility], [place] [address] [place], [place] [address]   Blood Culture (routine x 2)     Status: None (Preliminary result)   Collection Time: 10/17/22  5:20 PM   Specimen: BLOOD RIGHT WRIST  Result Value Ref Range Status   Specimen Description BLOOD RIGHT WRIST  Final   Special Requests   Final    BOTTLES DRAWN AEROBIC AND ANAEROBIC Blood Culture adequate volume   Culture   Final    NO GROWTH < 12 HOURS Performed at [facility], [place] [address] [place], [place] [address]    Report Status PENDING  Incomplete  Urine Culture     Status: None (Preliminary result)   Collection Time: 10/17/22  7:35 PM   Specimen: In/Out Cath Urine  Result Value Ref Range Status   Specimen Description IN/OUT CATH URINE  Final   Special Requests NONE  Final   Culture   Final    CULTURE REINCUBATED FOR BETTER GROWTH Performed at [facility], [place] [address], [place], [place] [address]    Report Status PENDING  Incomplete  MRSA Next Gen by PCR, Nasal     Status: None   Collection Time: 10/18/22 12:18 AM   Specimen: Nasal Mucosa; Nasal Swab  Result Value Ref Range Status   MRSA by PCR Next Gen NOT DETECTED NOT DETECTED Final    Comment: (NOTE) The GeneXpert MRSA Assay (FDA approved for NASAL specimens only), is one component of a comprehensive MRSA colonization surveillance program. It is not intended to diagnose MRSA infection nor to guide or monitor treatment for MRSA infections. Test performance is not FDA approved in patients less than 2 years old. Performed at [facility], [place] [address], [place], [place] [address]   MRSA Next Gen by PCR, Nasal     Status: None   Collection Time: 10/18/22  5:33 AM   Specimen: Nasal Mucosa; Nasal Swab  Result Value Ref Range Status   MRSA by PCR Next Gen NOT DETECTED NOT DETECTED Final    Comment: (NOTE) The GeneXpert MRSA Assay (FDA approved for NASAL specimens only), is one component of a comprehensive MRSA colonization  surveillance program. It is not intended to diagnose MRSA infection nor to guide or monitor treatment for MRSA infections. Test performance is not FDA approved in patients less than 94 years old. Performed at [facility], [place] [address], [place], [place] [address]          Radiology Studies: DG Chest Port 1 View  Result Date: 10/17/2022 CLINICAL DATA:  Questionable sepsis.  Consideration for aspiration. EXAM: PORTABLE CHEST  1 VIEW COMPARISON:  January 27, 2022. FINDINGS: EKG leads project over the chest. Image rotated to the LEFT, accounting for this cardiomediastinal contours and hilar structures are stable. Lungs are clear.  No pneumothorax. On limited assessment no acute skeletal findings. IMPRESSION: No acute cardiopulmonary disease. Electronically Signed   By: Zetta Bills M.D.   On: 10/17/2022 16:44        Scheduled Meds:  enoxaparin (LOVENOX) injection  40 mg Subcutaneous Q24H   glycopyrrolate  2 mg Oral TID   ketorolac  15 mg Intravenous Q6H   Continuous Infusions:  ampicillin-sulbactam (UNASYN) IV Stopped (10/19/22 0038)   dextrose 75 mL/hr at 10/19/22 0158     LOS: 2 days   Time spent= 35 mins    Ohn Bostic Arsenio Loader, MD [facility]  If 7PM-7AM, please contact night-coverage  10/19/2022, 3:53 AM

## 2022-10-19 NOTE — Progress Notes (Signed)
Palliative Medicine Inpatient Follow Up Note   HPI: Virginia [name] is a 86 y.o. female with medical history significant for dementia, hypertension, chronic hypoxia on 2 L nasal cannula continuously, who presented to [name] ED from home via EMS due to failure to thrive, poor oral intake and lethargy.  The history is obtained from the patient's daughter at bedside and also from Virginia [name] and review of medical records.  The patient has had a significant decrease in appetite and oral intake for the past 2 days.  Associated with subjective fevers.  Prior to presentation she had a fever with a Tmax of 100.4.  Recently diagnosed with sinusitis, she was prescribed antibiotics, Augmentin, and steroids, prednisone.    Palliative care saw Virginia [name] in March of this year at which time hospice was discussed though family was not ready for this emphasis of care.   Today's Discussion 10/19/2022  *Please note that this is a verbal dictation therefore any spelling or grammatical errors are due to the "[place] [employer]" system interpretation.  Chart reviewed inclusive of vital signs, progress notes, laboratory results, and diagnostic images.   I met at bedside with Virginia [name] and her daughter, Virginia [name] this morning.  Virginia [name] endorses feeling very anxious and reviews with me all of the interventions that have been pursued for Virginia [name], Virginia [name] overnight inclusive of a dose of Ativan and Dilaudid.  Virginia [name] and I stepped outside the room to discuss the plan.  I made her a detailed list of symptoms to anticipate in the oncoming days as well as modalities to alleviate those symptoms.  We reviewed the importance of ongoing self-care and support of other family members.  Discussed that once antibiotics and IV fluids are set sated Naijah will have likely a very short amount of time left on earth.  Discussed the importance of hospice in the home involvement.  Virginia [name] is very hopeful that tomorrow hospice of the [place] will review her mom's case and  agree to take her on as a patient for inpatient hospice.  Discussed the differences between inpatient hospice and home hospice.  Created space and opportunity for Virginia [name] to explore thoughts feelings and fears regarding current medical situation.  She shares the fear she has if she cannot manage symptoms appropriately.  I reassured her that hospice will help guide both herself and her family members on what to do when certain symptoms arise.  Discussed the role of hospice after patient's past and how they offer excellent bereavement support which I suggested Virginia [name] may need.  Questions and concerns addressed/Palliative Support Provided.   Objective Assessment: Vital Signs Vitals:   10/18/22 1941 10/19/22 0736  BP: (!) 116/40 (!) 130/54  Pulse: 87 97  Resp: 15 15  Temp: 99.3 F (37.4 C)   SpO2: 100% 100%    Intake/Output Summary (Last 24 hours) at 10/19/2022 1003 Last data filed at 10/19/2022 0601 Gross per 24 hour  Intake 1803.47 ml  Output --  Net 1803.47 ml   Last Weight  Most recent update: 10/18/2022 10:34 PM    Weight  53.2 kg (117 lb 4.6 oz)            Gen: Extremely frail ill-appearing elderly Caucasian female HEENT: moist mucous membranes CV: Irregular rate and rhythm  PULM: ON 2LPM breathing even and nonlabored ABD: soft/nontender EXT: Cool extremities Neuro: Somnolent  SUMMARY OF RECOMMENDATIONS   DNAR/DNI   Modified comfort care with continuation of mIVF and antibiotics until Monday   Await [facility] review of patients  case for possible release home on Monday with their services if approved  Emotional support provided to patients daughter   Ongoing palliative care support  Time Spent: 80 minutes Billing based on MDM: High ______________________________________________________________________________________ [place] Team Team Cell Phone: [phone] Please utilize secure chat with additional questions, if  there is no response within 30 minutes please call the above phone number  Palliative Medicine Team providers are available by phone from 7am to 7pm daily and can be reached through the team cell phone.  Should this patient require assistance outside of these hours, please call the patient's attending physician.

## 2022-10-20 ENCOUNTER — Other Ambulatory Visit (HOSPITAL_COMMUNITY): Payer: Self-pay

## 2022-10-20 ENCOUNTER — Telehealth (HOSPITAL_COMMUNITY): Payer: Self-pay | Admitting: Pharmacy Technician

## 2022-10-20 DIAGNOSIS — E870 Hyperosmolality and hypernatremia: Secondary | ICD-10-CM | POA: Diagnosis not present

## 2022-10-20 LAB — URINE CULTURE: Culture: 2000 — AB

## 2022-10-20 MED ORDER — SCOPOLAMINE 1 MG/3DAYS TD PT72
1.0000 | MEDICATED_PATCH | TRANSDERMAL | 0 refills | Status: AC
  Filled 2022-10-20: qty 4, 12d supply, fill #0

## 2022-10-20 MED ORDER — BISACODYL 10 MG RE SUPP
10.0000 mg | Freq: Every day | RECTAL | 0 refills | Status: AC | PRN
  Filled 2022-10-20: qty 12, 12d supply, fill #0

## 2022-10-20 MED ORDER — GLYCOPYRROLATE 1 MG PO TABS
2.0000 mg | ORAL_TABLET | Freq: Three times a day (TID) | ORAL | 0 refills | Status: AC
  Filled 2022-10-20: qty 30, 5d supply, fill #0

## 2022-10-20 MED ORDER — OXYCODONE HCL 5 MG PO TABS
5.0000 mg | ORAL_TABLET | Freq: Four times a day (QID) | ORAL | 0 refills | Status: AC | PRN
  Filled 2022-10-20: qty 10, 3d supply, fill #0

## 2022-10-20 MED ORDER — LORAZEPAM 1 MG PO TABS
1.0000 mg | ORAL_TABLET | Freq: Four times a day (QID) | ORAL | 0 refills | Status: AC | PRN
  Filled 2022-10-20: qty 30, 15d supply, fill #0
  Filled 2022-10-20: qty 30, 8d supply, fill #0

## 2022-10-20 MED ORDER — MORPHINE SULFATE (CONCENTRATE) 10 MG/0.5ML PO SOLN
5.0000 mg | ORAL | 0 refills | Status: AC | PRN
  Filled 2022-10-20: qty 30, 7d supply, fill #0

## 2022-10-20 MED ORDER — NYSTATIN 100000 UNIT/GM EX POWD
Freq: Three times a day (TID) | CUTANEOUS | 0 refills | Status: AC | PRN
  Filled 2022-10-20: qty 15, 7d supply, fill #0

## 2022-10-20 MED ORDER — ACETAMINOPHEN 650 MG RE SUPP
325.0000 mg | Freq: Four times a day (QID) | RECTAL | 0 refills | Status: AC | PRN
  Filled 2022-10-20: qty 12, 6d supply, fill #0

## 2022-10-20 NOTE — Telephone Encounter (Signed)
Patient Advocate Encounter  Prior Authorization for Scopolamine '1MG'$ /3DAYS 72 hr patches  has been approved.    PA# [id number] Effective dates: 10/20/2022 through 10/20/2023      Lyndel Safe, [facility] Patient Advocate Specialist [facility] Patient Advocate Team Direct Number: [phone]  Fax: [phone]

## 2022-10-20 NOTE — Progress Notes (Signed)
Pharmacy Antibiotic Note  Virginia [name] is a 86 y.o. female admitted on 10/17/2022 with fever and FTT, on Augementin at home for sinusitis. Urine culture from 11/17 with very low amounts of E. coli and E. Faecalis, both susceptible to ampicillin. Pharmacy has been consulted for Unasyn dosing.  No labs since 11/19 due to comfort care status. Afebrile, HR 100-110s  Plan: Continue Unasyn 3 g IV q6h during admission F/u length of therapy with primary provider Planning to discharge home with hospice care once arrangements are in place  Temp (24hrs), Avg:97.6 F (36.4 C), Min:97.6 F (36.4 C), Max:97.6 F (36.4 C)  Recent Labs  Lab 10/17/22 1720 10/18/22 0028 10/18/22 0703 10/19/22 0106  WBC 8.9 8.8 13.5*  --   CREATININE 0.89 1.14* 1.04* 0.76  LATICACIDVEN 1.0  --   --   --      Estimated Creatinine Clearance: 41.4 mL/min (by C-G formula based on SCr of 0.76 mg/dL).    Allergies  Allergen Reactions   Lidocaine Anaphylaxis, Swelling, Rash and Other (See Comments)    Any of the " [facility] "   Other Other (See Comments)    All drugs that end in "cane'-  novacane etc. Daughter states patient almost died when she had it when she was born   Antibiotics during admission: Unasyn 11/18 >>   Microbiology data during admission: 11/17 Urine cx: 2000 col/ml E. coli (pan-sensitive), 100 col/ml E. faecalis (pan-sensitive) 11/18 MRSA PCR: negative 11/18 Blood cx x2: NGTD x3d  Luisa Hart, PharmD, BCPS Clinical Pharmacist 10/20/2022 10:25 AM   Please refer to [facility] for pharmacy phone number

## 2022-10-20 NOTE — Discharge Summary (Signed)
Physician Discharge Summary  Virginia [name] MOQ:[id number] DOB: [birth year] DOA: 10/17/2022  PCP: Clovia Cuff, MD  Admit date: 10/17/2022 Discharge date: 10/20/2022  Admitted From: Home Disposition:  Home with Hospice.   Recommendations for Outpatient Follow-up:  Follow up with PCP in 1-2 weeks Please obtain BMP/CBC in one week your next doctors visit.  Comfort medications have been prescribed. If patient and family wishes they can continue taking previously prescribed oral Augmentin solution for 3 more days to complete her course   Discharge Condition: Stable CODE STATUS: DNR Diet recommendation: As tolerated  Brief/Interim Summary: 86 y.o. female with medical history significant for dementia, hypertension, chronic hypoxia on 2 L nasal cannula continuously, who presented to [facility] ED from home via EMS due to failure to thrive, poor oral intake and lethargy.  The history is obtained from the patient's daughter at bedside and also from [place] and review of medical records.  The patient has had a significant decrease in appetite and oral intake for the past 2 days.  Associated with subjective fevers.  Prior to presentation she had a fever with a Tmax of 100.4.  Recently diagnosed with sinusitis, she was prescribed antibiotics, Augmentin, and steroids, prednisone. In the ED, lethargic, hypovolemic and hypernatremic with serum sodium of 152.  UA negative for pyuria.  No clear evidence of pneumonia on chest x-ray.  Tachycardic.  She was started on IV fluid hydration.  TRH, hospitalist service was asked to admit. Palliative care consulted for [place].  Patient is now comfort care after discussion with family. [place] with IVF and Abx, but no escalation of care.  We will discharge the patient today with hospice care at home          Hypovolemic hypernatremia, likely secondary to dehydration from poor oral intake No further work-up.  Oral intake as tolerated   Acute metabolic encephalopathy in the setting  of hypernatremia Baseline dementia, nonverbal, bedbound.  Overall appears to be at baseline.  TSH low, Ammonia, B12 are normal.      Recently diagnosed sinusitis Fevers Was on Unasyn while here, can take previously prescribed home oral Augmentin solution for 3 more days to complete course if patient and family wishes -UA negative, flu, COVID-negative.     Goals of care After extensive discussion with patient's daughter and son [name] and [name] patient has been transitioned to comfort care.  We will plan on transitioning her home with hospice.  Palliative care team has been following the patient.   Overall patient has very poor prognosis         Body mass index is 21.45 kg/m.  Pressure Injury 10/18/22 Sacrum Medial Stage 1 -  Intact skin with non-blanchable redness of a localized area usually over a bony prominence. nonblanchable redness on sacrum (Active)  10/18/22 0526  Location: Sacrum  Location Orientation: Medial  Staging: Stage 1 -  Intact skin with non-blanchable redness of a localized area usually over a bony prominence.  Wound Description (Comments): nonblanchable redness on sacrum  Present on Admission: Yes      Discharge Diagnoses:  Principal Problem:   Hypernatremia Active Problems:   Pressure injury of skin      Consultations: Palliative care  Subjective: Seen and examined at bedside today.  No complaints.  Awake but no meaningful interaction  Discharge Exam: Vitals:   10/19/22 0736 10/19/22 2002  BP: (!) 130/54 137/60  Pulse: 97 (!) 114  Resp: 15   Temp:  97.6 F (36.4 C)  SpO2: 100% 92%  Vitals:   10/18/22 1500 10/18/22 1941 10/19/22 0736 10/19/22 2002  BP: (!) 112/39 (!) 116/40 (!) 130/54 137/60  Pulse: (!) 51 87 97 (!) 114  Resp: '14 15 15   '$ Temp:  99.3 F (37.4 C)  97.6 F (36.4 C)  TempSrc:  Axillary  Axillary  SpO2: 100% 100% 100% 92%  Weight:  53.2 kg    Height:  '5\' 2"'$  (1.575 m)      General: Patient is awake but no  meaningful interaction Cardiovascular: RRR, S1/S2 +, no rubs, no gallops Respiratory: CTA bilaterally, no wheezing, no rhonchi Abdominal: Soft, NT, ND, bowel sounds + Extremities: no edema, no cyanosis, 2+ bilateral lower extremity pitting edema  Discharge Instructions   Allergies as of 10/20/2022       Reactions   Lidocaine Anaphylaxis, Swelling, Rash, Other (See Comments)   Any of the " [facility] "   Other Other (See Comments)   All drugs that end in "cane'-  novacane etc. Daughter states patient almost died when she had it when she was born        Medication List     STOP taking these medications    amLODipine 5 MG tablet Commonly known as: NORVASC   prednisoLONE 15 MG/5ML Soln Commonly known as: PRELONE   prednisoLONE 15 MG/5ML solution Commonly known as: ORAPRED   propranolol 20 MG tablet Commonly known as: INDERAL       TAKE these medications    acetaminophen 160 MG/5ML liquid Commonly known as: TYLENOL Take 320 mg by mouth in the morning and at bedtime. What changed: Another medication with the same name was added. Make sure you understand how and when to take each.   acetaminophen 325 MG suppository Commonly known as: TYLENOL Place 1 suppository (325 mg total) rectally every 6 (six) hours as needed for moderate pain, mild pain or fever. What changed: You were already taking a medication with the same name, and this prescription was added. Make sure you understand how and when to take each.   amoxicillin-clavulanate 600-42.9 MG/5ML suspension Commonly known as: AUGMENTIN amoxicillin 600 mg-potassium clavulanate 42.9 mg/5 mL oral suspension   barrier cream Crea Commonly known as: non-specified Apply 1 Application topically See admin instructions. 1 application topically with every diaper change. Soothe and Cool barrier cream   barrier cream Crea Commonly known as: non-specified Apply 1 Application topically See admin instructions. 1 application  topically with every diaper change. Extra Protection cream   bisacodyl 10 MG suppository Commonly known as: DULCOLAX Place 1 suppository (10 mg total) rectally daily as needed for moderate constipation.   budesonide 0.25 MG/2ML nebulizer solution Commonly known as: PULMICORT Take 0.25 mg by nebulization 2 (two) times daily.   Carbinoxamine Maleate 4 MG Tabs TAKE 1 TABLET IN MORNING AND 1 AT BEDTIME   Cetirizine HCl Childrens Alrgy 5 MG/5ML Soln Generic drug: cetirizine HCl Take by mouth.   CORICIDIN HBP PO Take 30 mLs by mouth in the morning and at bedtime. Liquid   ENDIT EX Apply 1 Application topically See admin instructions. 1 application topically with every diaper change.   formoterol 20 MCG/2ML nebulizer solution Commonly known as: PERFOROMIST TAKE 2 MLS (20 MCG TOTAL) BY NEBULIZATION 2 (TWO) TIMES DAILY.   glycopyrrolate 1 MG tablet Commonly known as: ROBINUL Take 2 tablets (2 mg total) by mouth 3 (three) times daily.   LORazepam 2 MG/ML concentrated solution Commonly known as: ATIVAN Place 0.5 mLs (1 mg total) under the tongue every  6 (six) hours as needed for anxiety.   morphine CONCENTRATE 10 MG/0.5ML Soln concentrated solution Take 0.25 mLs (5 mg total) by mouth every 2 (two) hours as needed for moderate pain or shortness of breath (or dyspnea).   nystatin powder Commonly known as: MYCOSTATIN/NYSTOP Apply topically 3 (three) times daily as needed (affected skin).   oxyCODONE 5 MG immediate release tablet Commonly known as: Oxy IR/ROXICODONE Take 1 tablet (5 mg total) by mouth every 6 (six) hours as needed for moderate pain or severe pain.   scopolamine 1 MG/3DAYS Commonly known as: TRANSDERM-SCOP Place 1 patch (1.5 mg total) onto the skin every 3 (three) days. Start taking on: October 22, 2022        Follow-up Information     Clovia Cuff, MD Follow up in 1 week(s).   Specialty: Internal Medicine Contact information: [place] [id number] [phone]                Allergies  Allergen Reactions   Lidocaine Anaphylaxis, Swelling, Rash and Other (See Comments)    Any of the " [facility] "   Other Other (See Comments)    All drugs that end in "cane'-  novacane etc. Daughter states patient almost died when she had it when she was born    You were cared for by a hospitalist during your hospital stay. If you have any questions about your discharge medications or the care you received while you were in the hospital after you are discharged, you can call the unit and asked to speak with the hospitalist on call if the hospitalist that took care of you is not available. Once you are discharged, your primary care physician will handle any further medical issues. Please note that no refills for any discharge medications will be authorized once you are discharged, as it is imperative that you return to your primary care physician (or establish a relationship with a primary care physician if you do not have one) for your aftercare needs so that they can reassess your need for medications and monitor your lab values.   Procedures/Studies: DG Chest Port 1 View  Result Date: 10/17/2022 CLINICAL DATA:  Questionable sepsis.  Consideration for aspiration. EXAM: PORTABLE CHEST 1 VIEW COMPARISON:  January 27, 2022. FINDINGS: EKG leads project over the chest. Image rotated to the LEFT, accounting for this cardiomediastinal contours and hilar structures are stable. Lungs are clear.  No pneumothorax. On limited assessment no acute skeletal findings. IMPRESSION: No acute cardiopulmonary disease. Electronically Signed   By: Zetta Bills M.D.   On: 10/17/2022 16:44     The results of significant diagnostics from this hospitalization (including imaging, microbiology, ancillary and laboratory) are listed below for reference.     Microbiology: Recent Results (from the past 240 hour(s))  Resp Panel by RT-PCR  (Flu A&B, Covid) Anterior Nasal Swab     Status: None   Collection Time: 10/17/22  4:32 PM   Specimen: Anterior Nasal Swab  Result Value Ref Range Status   SARS Coronavirus 2 by RT PCR NEGATIVE NEGATIVE Final    Comment: (NOTE) SARS-CoV-2 target nucleic acids are NOT DETECTED.  The SARS-CoV-2 RNA is generally detectable in upper respiratory specimens during the acute phase of infection. The lowest concentration of SARS-CoV-2 viral copies this assay can detect is 138 copies/mL. A negative result does not preclude SARS-Cov-2 infection and should not be used as the sole basis for treatment or other patient management decisions.  A negative result may occur with  improper specimen collection/handling, submission of specimen other than nasopharyngeal swab, presence of viral mutation(s) within the areas targeted by this assay, and inadequate number of viral copies(<138 copies/mL). A negative result must be combined with clinical observations, patient history, and epidemiological information. The expected result is Negative.  Fact Sheet for Patients:  EntrepreneurPulse.com.au  Fact Sheet for Healthcare Providers:  IncredibleEmployment.be  This test is no t yet approved or cleared by the [place] FDA and  has been authorized for detection and/or diagnosis of SARS-CoV-2 by FDA under an Emergency Use Authorization (EUA). This EUA will remain  in effect (meaning this test can be used) for the duration of the COVID-19 declaration under Section 564(b)(1) of the Act, 21 U.S.C.section 360bbb-3(b)(1), unless the authorization is terminated  or revoked sooner.       Influenza A by PCR NEGATIVE NEGATIVE Final   Influenza B by PCR NEGATIVE NEGATIVE Final    Comment: (NOTE) The Xpert Xpress SARS-CoV-2/FLU/RSV plus assay is intended as an aid in the diagnosis of influenza from Nasopharyngeal swab specimens and should not be used as a sole basis for  treatment. Nasal washings and aspirates are unacceptable for Xpert Xpress SARS-CoV-2/FLU/RSV testing.  Fact Sheet for Patients: EntrepreneurPulse.com.au  Fact Sheet for Healthcare Providers: IncredibleEmployment.be  This test is not yet approved or cleared by the [place] FDA and has been authorized for detection and/or diagnosis of SARS-CoV-2 by FDA under an Emergency Use Authorization (EUA). This EUA will remain in effect (meaning this test can be used) for the duration of the COVID-19 declaration under Section 564(b)(1) of the Act, 21 U.S.C. section 360bbb-3(b)(1), unless the authorization is terminated or revoked.  Performed at [facility], [place] [address] [place], [place] [address]   Blood Culture (routine x 2)     Status: None (Preliminary result)   Collection Time: 10/17/22  5:20 PM   Specimen: BLOOD RIGHT WRIST  Result Value Ref Range Status   Specimen Description BLOOD RIGHT WRIST  Final   Special Requests   Final    BOTTLES DRAWN AEROBIC AND ANAEROBIC Blood Culture adequate volume   Culture   Final    NO GROWTH 2 DAYS Performed at [facility], [place] [address], [place], [place] [address]    Report Status PENDING  Incomplete  Urine Culture     Status: Abnormal   Collection Time: 10/17/22  7:35 PM   Specimen: In/Out Cath Urine  Result Value Ref Range Status   Specimen Description IN/OUT CATH URINE  Final   Special Requests   Final    NONE Performed at [facility], [place] [address], [place], [place] [address]    Culture (A)  Final    2,000 COLONIES/mL ESCHERICHIA COLI 100 COLONIES/mL ENTEROCOCCUS FAECALIS    Report Status 10/20/2022 FINAL  Final   Organism ID, Bacteria ESCHERICHIA COLI (A)  Final   Organism ID, Bacteria ENTEROCOCCUS FAECALIS (A)  Final      Susceptibility   Escherichia coli - MIC*    AMPICILLIN 8 SENSITIVE Sensitive     CEFAZOLIN <=4 SENSITIVE Sensitive     CEFEPIME <=0.12 SENSITIVE  Sensitive     CEFTRIAXONE <=0.25 SENSITIVE Sensitive     CIPROFLOXACIN <=0.25 SENSITIVE Sensitive     GENTAMICIN <=1 SENSITIVE Sensitive     IMIPENEM <=0.25 SENSITIVE Sensitive     NITROFURANTOIN <=16 SENSITIVE Sensitive     TRIMETH/SULFA <=20 SENSITIVE Sensitive  AMPICILLIN/SULBACTAM 4 SENSITIVE Sensitive     PIP/TAZO <=4 SENSITIVE Sensitive     * 2,000 COLONIES/mL ESCHERICHIA COLI   Enterococcus faecalis - MIC*    AMPICILLIN <=2 SENSITIVE Sensitive     NITROFURANTOIN <=16 SENSITIVE Sensitive     VANCOMYCIN 2 SENSITIVE Sensitive     * 100 COLONIES/mL ENTEROCOCCUS FAECALIS  MRSA Next Gen by PCR, Nasal     Status: None   Collection Time: 10/18/22 12:18 AM   Specimen: Nasal Mucosa; Nasal Swab  Result Value Ref Range Status   MRSA by PCR Next Gen NOT DETECTED NOT DETECTED Final    Comment: (NOTE) The GeneXpert MRSA Assay (FDA approved for NASAL specimens only), is one component of a comprehensive MRSA colonization surveillance program. It is not intended to diagnose MRSA infection nor to guide or monitor treatment for MRSA infections. Test performance is not FDA approved in patients less than 54 years old. Performed at [facility], [place] [address] [place], [place] [address]   MRSA Next Gen by PCR, Nasal     Status: None   Collection Time: 10/18/22  5:33 AM   Specimen: Nasal Mucosa; Nasal Swab  Result Value Ref Range Status   MRSA by PCR Next Gen NOT DETECTED NOT DETECTED Final    Comment: (NOTE) The GeneXpert MRSA Assay (FDA approved for NASAL specimens only), is one component of a comprehensive MRSA colonization surveillance program. It is not intended to diagnose MRSA infection nor to guide or monitor treatment for MRSA infections. Test performance is not FDA approved in patients less than 45 years old. Performed at [facility], [place] [address] [place], [place] [address]   Blood Culture (routine x 2)     Status: None (Preliminary result)   Collection Time:  10/18/22  7:10 AM   Specimen: BLOOD  Result Value Ref Range Status   Specimen Description BLOOD RIGHT ANTECUBITAL  Final   Special Requests   Final    BOTTLES DRAWN AEROBIC AND ANAEROBIC Blood Culture adequate volume   Culture   Final    NO GROWTH < 24 HOURS Performed at [facility], [place] [address] [place], [place] [address]    Report Status PENDING  Incomplete     Labs: BNP (last 3 results) Recent Labs    01/26/22 1312  BNP 914.7*   Basic Metabolic Panel: Recent Labs  Lab 10/17/22 1720 10/18/22 0028 10/18/22 0703 10/19/22 0106  NA 152* 155* 157* 151*  K 4.2 4.4 4.1 3.8  CL 113* 118* 118* 116*  CO2 '31 25 31 27  '$ GLUCOSE 150* 125* 107* 104*  BUN 35* 32* 31* 26*  CREATININE 0.89 1.14* 1.04* 0.76  CALCIUM 9.5 8.3* 8.7* 8.5*  MG  --   --  2.3  --   PHOS  --   --  4.0  --    Liver Function Tests: Recent Labs  Lab 10/17/22 1720  AST 20  ALT 13  ALKPHOS 47  BILITOT 1.0  PROT 6.4*  ALBUMIN 3.0*   No results for input(s): "LIPASE", "AMYLASE" in the last 168 hours. Recent Labs  Lab 10/18/22 0703  AMMONIA 19   CBC: Recent Labs  Lab 10/17/22 1720 10/18/22 0028 10/18/22 0703  WBC 8.9 8.8 13.5*  NEUTROABS 7.4 5.5 8.0*  HGB 13.8 12.6 12.3  HCT 47.4* 42.8 43.1  MCV 102.2* 103.9* 104.1*  PLT 200 166 243   Cardiac Enzymes: No results for input(s): "CKTOTAL", "CKMB", "CKMBINDEX", "TROPONINI" in the  last 168 hours. BNP: Invalid input(s): "POCBNP" CBG: Recent Labs  Lab 10/18/22 0002 10/18/22 0525 10/18/22 0721 10/18/22 1159  GLUCAP 143* 85 90 150*   D-Dimer No results for input(s): "DDIMER" in the last 72 hours. Hgb A1c Recent Labs    10/18/22 0703  HGBA1C 5.0   Lipid Profile No results for input(s): "CHOL", "HDL", "LDLCALC", "TRIG", "CHOLHDL", "LDLDIRECT" in the last 72 hours. Thyroid function studies Recent Labs    10/18/22 0028  TSH 0.160*   Anemia work up Recent Labs    10/18/22 0703 10/18/22 0739  VITAMINB12  --  424   FOLATE 32.1  --    Urinalysis    Component Value Date/Time   COLORURINE YELLOW 10/17/2022 1935   APPEARANCEUR CLEAR 10/17/2022 1935   LABSPEC 1.023 10/17/2022 1935   PHURINE 5.0 10/17/2022 1935   GLUCOSEU NEGATIVE 10/17/2022 1935   HGBUR NEGATIVE 10/17/2022 1935   BILIRUBINUR NEGATIVE 10/17/2022 1935   BILIRUBINUR neg 12/29/2017 1439   KETONESUR 5 (A) 10/17/2022 1935   PROTEINUR 30 (A) 10/17/2022 1935   UROBILINOGEN 0.2 06/14/2018 1537   NITRITE NEGATIVE 10/17/2022 1935   LEUKOCYTESUR NEGATIVE 10/17/2022 1935   Sepsis Labs Recent Labs  Lab 10/17/22 1720 10/18/22 0028 10/18/22 0703  WBC 8.9 8.8 13.5*   Microbiology Recent Results (from the past 240 hour(s))  Resp Panel by RT-PCR (Flu A&B, Covid) Anterior Nasal Swab     Status: None   Collection Time: 10/17/22  4:32 PM   Specimen: Anterior Nasal Swab  Result Value Ref Range Status   SARS Coronavirus 2 by RT PCR NEGATIVE NEGATIVE Final    Comment: (NOTE) SARS-CoV-2 target nucleic acids are NOT DETECTED.  The SARS-CoV-2 RNA is generally detectable in upper respiratory specimens during the acute phase of infection. The lowest concentration of SARS-CoV-2 viral copies this assay can detect is 138 copies/mL. A negative result does not preclude SARS-Cov-2 infection and should not be used as the sole basis for treatment or other patient management decisions. A negative result may occur with  improper specimen collection/handling, submission of specimen other than nasopharyngeal swab, presence of viral mutation(s) within the areas targeted by this assay, and inadequate number of viral copies(<138 copies/mL). A negative result must be combined with clinical observations, patient history, and epidemiological information. The expected result is Negative.  Fact Sheet for Patients:  EntrepreneurPulse.com.au  Fact Sheet for Healthcare Providers:  IncredibleEmployment.be  This test is no t  yet approved or cleared by the [place] FDA and  has been authorized for detection and/or diagnosis of SARS-CoV-2 by FDA under an Emergency Use Authorization (EUA). This EUA will remain  in effect (meaning this test can be used) for the duration of the COVID-19 declaration under Section 564(b)(1) of the Act, 21 U.S.C.section 360bbb-3(b)(1), unless the authorization is terminated  or revoked sooner.       Influenza A by PCR NEGATIVE NEGATIVE Final   Influenza B by PCR NEGATIVE NEGATIVE Final    Comment: (NOTE) The Xpert Xpress SARS-CoV-2/FLU/RSV plus assay is intended as an aid in the diagnosis of influenza from Nasopharyngeal swab specimens and should not be used as a sole basis for treatment. Nasal washings and aspirates are unacceptable for Xpert Xpress SARS-CoV-2/FLU/RSV testing.  Fact Sheet for Patients: EntrepreneurPulse.com.au  Fact Sheet for Healthcare Providers: IncredibleEmployment.be  This test is not yet approved or cleared by the [place] FDA and has been authorized for detection and/or diagnosis of SARS-CoV-2 by FDA under an Emergency Use Authorization (EUA). This  EUA will remain in effect (meaning this test can be used) for the duration of the COVID-19 declaration under Section 564(b)(1) of the Act, 21 U.S.C. section 360bbb-3(b)(1), unless the authorization is terminated or revoked.  Performed at [facility], [place] [address], [place], [place] [address]   Blood Culture (routine x 2)     Status: None (Preliminary result)   Collection Time: 10/17/22  5:20 PM   Specimen: BLOOD RIGHT WRIST  Result Value Ref Range Status   Specimen Description BLOOD RIGHT WRIST  Final   Special Requests   Final    BOTTLES DRAWN AEROBIC AND ANAEROBIC Blood Culture adequate volume   Culture   Final    NO GROWTH 2 DAYS Performed at [facility], [place] [address] [place], [place] [address]    Report Status PENDING  Incomplete   Urine Culture     Status: Abnormal   Collection Time: 10/17/22  7:35 PM   Specimen: In/Out Cath Urine  Result Value Ref Range Status   Specimen Description IN/OUT CATH URINE  Final   Special Requests   Final    NONE Performed at [facility], [place] [address] [place], [place] [address]    Culture (A)  Final    2,000 COLONIES/mL ESCHERICHIA COLI 100 COLONIES/mL ENTEROCOCCUS FAECALIS    Report Status 10/20/2022 FINAL  Final   Organism ID, Bacteria ESCHERICHIA COLI (A)  Final   Organism ID, Bacteria ENTEROCOCCUS FAECALIS (A)  Final      Susceptibility   Escherichia coli - MIC*    AMPICILLIN 8 SENSITIVE Sensitive     CEFAZOLIN <=4 SENSITIVE Sensitive     CEFEPIME <=0.12 SENSITIVE Sensitive     CEFTRIAXONE <=0.25 SENSITIVE Sensitive     CIPROFLOXACIN <=0.25 SENSITIVE Sensitive     GENTAMICIN <=1 SENSITIVE Sensitive     IMIPENEM <=0.25 SENSITIVE Sensitive     NITROFURANTOIN <=16 SENSITIVE Sensitive     TRIMETH/SULFA <=20 SENSITIVE Sensitive     AMPICILLIN/SULBACTAM 4 SENSITIVE Sensitive     PIP/TAZO <=4 SENSITIVE Sensitive     * 2,000 COLONIES/mL ESCHERICHIA COLI   Enterococcus faecalis - MIC*    AMPICILLIN <=2 SENSITIVE Sensitive     NITROFURANTOIN <=16 SENSITIVE Sensitive     VANCOMYCIN 2 SENSITIVE Sensitive     * 100 COLONIES/mL ENTEROCOCCUS FAECALIS  MRSA Next Gen by PCR, Nasal     Status: None   Collection Time: 10/18/22 12:18 AM   Specimen: Nasal Mucosa; Nasal Swab  Result Value Ref Range Status   MRSA by PCR Next Gen NOT DETECTED NOT DETECTED Final    Comment: (NOTE) The GeneXpert MRSA Assay (FDA approved for NASAL specimens only), is one component of a comprehensive MRSA colonization surveillance program. It is not intended to diagnose MRSA infection nor to guide or monitor treatment for MRSA infections. Test performance is not FDA approved in patients less than 77 years old. Performed at [facility], [place] [address], [place],  [address]   MRSA  Next Gen by PCR, Nasal     Status: None   Collection Time: 10/18/22  5:33 AM   Specimen: Nasal Mucosa; Nasal Swab  Result Value Ref Range Status   MRSA by PCR Next Gen NOT DETECTED NOT DETECTED Final    Comment: (NOTE) The GeneXpert MRSA Assay (FDA approved for NASAL specimens only), is one component of a comprehensive MRSA colonization surveillance program. It is not intended to diagnose MRSA infection nor to guide or  monitor treatment for MRSA infections. Test performance is not FDA approved in patients less than 13 years old. Performed at [facility], [place] [address] [place], [place] [address]   Blood Culture (routine x 2)     Status: None (Preliminary result)   Collection Time: 10/18/22  7:10 AM   Specimen: BLOOD  Result Value Ref Range Status   Specimen Description BLOOD RIGHT ANTECUBITAL  Final   Special Requests   Final    BOTTLES DRAWN AEROBIC AND ANAEROBIC Blood Culture adequate volume   Culture   Final    NO GROWTH < 24 HOURS Performed at [facility], [place] [address], [place], [place] [address]    Report Status PENDING  Incomplete     Time coordinating discharge:  I have spent 35 minutes face to face with the patient and on the ward discussing the patients care, assessment, plan and disposition with other care givers. >50% of the time was devoted counseling the patient about the risks and benefits of treatment/Discharge disposition and coordinating care.   SIGNED:   Damita Lack, MD  [facility] 10/20/2022, 7:47 AM   If 7PM-7AM, please contact night-coverage

## 2022-10-20 NOTE — Care Management Important Message (Signed)
Important Message  Patient Details  Name: LARAYAH CLUTE MRN: [id number] Date of Birth: [birth year]   Medicare Important Message Given:  Yes     Shelda Altes 10/20/2022, 10:23 AM

## 2022-10-20 NOTE — Progress Notes (Signed)
Patient DT:Virginia [name]      DOB: Aug 08, 1936      ILN:[id number]      Palliative Medicine Team    Subjective: Bedside symptom check completed. Daughter [name] is bedside at this time, RN and attending MD bedside at time of visit also.   Physical exam: Patient resting in bed with eyes closed at time of visit. Breathing even and non-labored with nasal cannula applied, no excessive secretions noted. Patient without physical or non-verbal signs of pain or discomfort at this time. This RN did not attempt to wake patient, to promote comfort.   Assessment and plan: This RN reached out to [facility], to discuss plan moving forward. [facility] to touch base with daughter and plan for discharge home this afternoon. All questions addressed at this time. Patient appears stable for transfer today. Will continue to follow for any changes or advances.    Thank you for allowing the Palliative Medicine Team to assist in the care of this patient.     Damian Leavell, MSN, [place] Palliative Medicine Team Team Phone: [phone]  This phone is monitored 7a-7p, please reach out to attending physician outside of these hours for urgent needs.

## 2022-10-20 NOTE — TOC Transition Note (Signed)
Transition of Care (TOC) - CM/SW Discharge Note Marvetta Gibbons RN, BSN Transitions of Care Unit 4E- RN Case Manager See Treatment Team for direct phone #   Patient Details  Name: Virginia [name] MRN: [id number] Date of Birth: 1936-06-28  Transition of Care [facility]) CM/SW Contact:  Dawayne Patricia, RN Phone Number: 10/20/2022, 11:31 AM   Clinical Narrative:    Pt stable for transition home w/ Hospice.  CM has heard from liaison- Webb Silversmith at [facility] and they have confirmed that they can provide Hospice services and see pt with visit this afternoon in the home.   CM spoke with daughter at bedside who has spoken with Cheri as well and plan is to try and get patient home by 2pm for Home Hospice visit. Pt to transport via EMS- verified address w/ daughter.  Caregiver at bedside as well.   1105- [facility] called for transport home- ETA per [facility] is within the hour. Paperwork and GOLD DNR placed on chart for transport needs.   No further TOC needs noted.    Final next level of care: Home w Hospice Care Barriers to Discharge: Barriers Resolved   Patient Goals and CMS Choice Patient states their goals for this hospitalization and ongoing recovery are:: return home w/ hospice CMS Medicare.gov Compare Post Acute Care list provided to:: Patient Represenative (must comment) (daughter) Choice offered to / list presented to : Adult Children  Discharge Placement               Home w/ Home Hospice.         Discharge Plan and Services   Discharge Planning Services: CM Consult Post Acute Care Choice: Hospice          DME Arranged: N/A DME Agency: NA       HH Arranged: Disease Management [place] Agency: [place] Date Advance: 10/20/22 Time [place]: 0940 Representative spoke with at [place]: [place] Determinants of Health ([place]) Interventions     Readmission Risk Interventions    10/20/2022   11:30 AM  Readmission  Risk Prevention Plan  Transportation Screening Complete  Home Care Screening Complete  Medication Review (RN CM) Complete

## 2022-10-20 NOTE — Telephone Encounter (Signed)
Patient Advocate Encounter   Received notification that prior authorization for Scopolamine '1MG'$ /3DAYS 72 hr patches is required.   PA submitted on 10/20/2022 Key [id number] Status is pending       Lyndel Safe, [place] Patient Advocate Specialist [place] Patient Advocate Team Direct Number: [phone]  Fax: [phone]

## 2022-10-22 LAB — CULTURE, BLOOD (ROUTINE X 2)
Culture: NO GROWTH
Special Requests: ADEQUATE

## 2022-10-23 LAB — CULTURE, BLOOD (ROUTINE X 2)
Culture: NO GROWTH
Special Requests: ADEQUATE

## 2022-10-31 DIAGNOSIS — M810 Age-related osteoporosis without current pathological fracture: Secondary | ICD-10-CM | POA: Diagnosis not present

## 2022-10-31 DIAGNOSIS — F01C11 Vascular dementia, severe, with agitation: Secondary | ICD-10-CM | POA: Diagnosis not present

## 2022-10-31 DIAGNOSIS — I130 Hypertensive heart and chronic kidney disease with heart failure and stage 1 through stage 4 chronic kidney disease, or unspecified chronic kidney disease: Secondary | ICD-10-CM | POA: Diagnosis not present

## 2022-10-31 DIAGNOSIS — J309 Allergic rhinitis, unspecified: Secondary | ICD-10-CM | POA: Diagnosis not present

## 2022-10-31 DIAGNOSIS — R634 Abnormal weight loss: Secondary | ICD-10-CM | POA: Diagnosis not present

## 2022-10-31 DIAGNOSIS — E440 Moderate protein-calorie malnutrition: Secondary | ICD-10-CM | POA: Diagnosis not present

## 2022-10-31 DIAGNOSIS — I679 Cerebrovascular disease, unspecified: Secondary | ICD-10-CM | POA: Diagnosis not present

## 2022-10-31 DIAGNOSIS — Z9981 Dependence on supplemental oxygen: Secondary | ICD-10-CM | POA: Diagnosis not present

## 2022-10-31 DIAGNOSIS — I6529 Occlusion and stenosis of unspecified carotid artery: Secondary | ICD-10-CM | POA: Diagnosis not present

## 2022-10-31 DIAGNOSIS — I5032 Chronic diastolic (congestive) heart failure: Secondary | ICD-10-CM | POA: Diagnosis not present

## 2022-10-31 DIAGNOSIS — Z8639 Personal history of other endocrine, nutritional and metabolic disease: Secondary | ICD-10-CM | POA: Diagnosis not present

## 2022-10-31 DIAGNOSIS — J329 Chronic sinusitis, unspecified: Secondary | ICD-10-CM | POA: Diagnosis not present

## 2022-10-31 DIAGNOSIS — J449 Chronic obstructive pulmonary disease, unspecified: Secondary | ICD-10-CM | POA: Diagnosis not present

## 2022-10-31 DIAGNOSIS — N1832 Chronic kidney disease, stage 3b: Secondary | ICD-10-CM | POA: Diagnosis not present

## 2022-10-31 DIAGNOSIS — F419 Anxiety disorder, unspecified: Secondary | ICD-10-CM | POA: Diagnosis not present

## 2022-10-31 DIAGNOSIS — M1990 Unspecified osteoarthritis, unspecified site: Secondary | ICD-10-CM | POA: Diagnosis not present

## 2022-10-31 DIAGNOSIS — Z87440 Personal history of urinary (tract) infections: Secondary | ICD-10-CM | POA: Diagnosis not present

## 2022-10-31 DIAGNOSIS — E559 Vitamin D deficiency, unspecified: Secondary | ICD-10-CM | POA: Diagnosis not present

## 2022-10-31 DIAGNOSIS — Z87891 Personal history of nicotine dependence: Secondary | ICD-10-CM | POA: Diagnosis not present

## 2022-11-06 DIAGNOSIS — Z7901 Long term (current) use of anticoagulants: Secondary | ICD-10-CM | POA: Diagnosis not present

## 2022-11-06 DIAGNOSIS — J9611 Chronic respiratory failure with hypoxia: Secondary | ICD-10-CM | POA: Diagnosis not present

## 2022-11-06 DIAGNOSIS — Z7401 Bed confinement status: Secondary | ICD-10-CM | POA: Diagnosis not present

## 2022-11-06 DIAGNOSIS — J302 Other seasonal allergic rhinitis: Secondary | ICD-10-CM | POA: Diagnosis not present

## 2022-12-01 DEATH — deceased

## 2022-12-09 ENCOUNTER — Other Ambulatory Visit (HOSPITAL_COMMUNITY): Payer: Self-pay

## 2023-04-13 ENCOUNTER — Other Ambulatory Visit (HOSPITAL_COMMUNITY): Payer: Self-pay

## 2023-10-01 ENCOUNTER — Telehealth: Payer: Self-pay | Admitting: *Deleted

## 2023-10-02 NOTE — Telephone Encounter (Signed)
error
